# Patient Record
Sex: Male | Born: 1960 | Race: Black or African American | Hispanic: No | Smoking: Former smoker
Health system: Southern US, Community
[De-identification: ages and names within clinical notes are randomized; demographics above are authoritative.]

## PROBLEM LIST (undated history)

## (undated) DIAGNOSIS — E119 Type 2 diabetes mellitus without complications: Secondary | ICD-10-CM

## (undated) DIAGNOSIS — I4891 Unspecified atrial fibrillation: Secondary | ICD-10-CM

## (undated) DIAGNOSIS — I639 Cerebral infarction, unspecified: Secondary | ICD-10-CM

## (undated) DIAGNOSIS — Z87898 Personal history of other specified conditions: Secondary | ICD-10-CM

## (undated) DIAGNOSIS — I10 Essential (primary) hypertension: Secondary | ICD-10-CM

## (undated) HISTORY — DX: Essential (primary) hypertension: I10

## (undated) HISTORY — DX: Type 2 diabetes mellitus without complications: E11.9

## (undated) HISTORY — DX: Cerebral infarction, unspecified: I63.9

## (undated) HISTORY — DX: Personal history of other specified conditions: Z87.898

## (undated) HISTORY — PX: CHOLECYSTECTOMY: SHX55

---

## 2017-03-30 ENCOUNTER — Inpatient Hospital Stay
Admission: EM | Admit: 2017-03-30 | Discharge: 2017-03-31 | DRG: 637 | Disposition: A | Discharge status: Other Acute Inpatient Hospital | Payer: Medicaid Other | Attending: Internal Medicine | Admitting: Internal Medicine

## 2017-03-30 ENCOUNTER — Inpatient Hospital Stay: Payer: Medicaid Other

## 2017-03-30 ENCOUNTER — Emergency Department: Payer: Medicaid Other

## 2017-03-30 DIAGNOSIS — R51 Headache: Secondary | ICD-10-CM | POA: Diagnosis not present

## 2017-03-30 DIAGNOSIS — Y92230 Patient room in hospital as the place of occurrence of the external cause: Secondary | ICD-10-CM | POA: Diagnosis not present

## 2017-03-30 DIAGNOSIS — T17908S Unspecified foreign body in respiratory tract, part unspecified causing other injury, sequela: Secondary | ICD-10-CM | POA: Diagnosis not present

## 2017-03-30 DIAGNOSIS — Z4659 Encounter for fitting and adjustment of other gastrointestinal appliance and device: Secondary | ICD-10-CM

## 2017-03-30 DIAGNOSIS — Z431 Encounter for attention to gastrostomy: Secondary | ICD-10-CM | POA: Diagnosis not present

## 2017-03-30 DIAGNOSIS — G936 Cerebral edema: Secondary | ICD-10-CM | POA: Diagnosis not present

## 2017-03-30 DIAGNOSIS — R402134 Coma scale, eyes open, to sound, 24 hours or more after hospital admission: Secondary | ICD-10-CM | POA: Diagnosis not present

## 2017-03-30 DIAGNOSIS — E871 Hypo-osmolality and hyponatremia: Secondary | ICD-10-CM | POA: Diagnosis present

## 2017-03-30 DIAGNOSIS — R109 Unspecified abdominal pain: Secondary | ICD-10-CM | POA: Diagnosis not present

## 2017-03-30 DIAGNOSIS — I63511 Cerebral infarction due to unspecified occlusion or stenosis of right middle cerebral artery: Secondary | ICD-10-CM | POA: Diagnosis not present

## 2017-03-30 DIAGNOSIS — D72829 Elevated white blood cell count, unspecified: Secondary | ICD-10-CM | POA: Diagnosis present

## 2017-03-30 DIAGNOSIS — Z789 Other specified health status: Secondary | ICD-10-CM | POA: Diagnosis not present

## 2017-03-30 DIAGNOSIS — J9602 Acute respiratory failure with hypercapnia: Secondary | ICD-10-CM | POA: Diagnosis not present

## 2017-03-30 DIAGNOSIS — G44319 Acute post-traumatic headache, not intractable: Secondary | ICD-10-CM | POA: Diagnosis not present

## 2017-03-30 DIAGNOSIS — Z930 Tracheostomy status: Secondary | ICD-10-CM | POA: Diagnosis not present

## 2017-03-30 DIAGNOSIS — D649 Anemia, unspecified: Secondary | ICD-10-CM | POA: Diagnosis not present

## 2017-03-30 DIAGNOSIS — R739 Hyperglycemia, unspecified: Secondary | ICD-10-CM | POA: Diagnosis present

## 2017-03-30 DIAGNOSIS — R579 Shock, unspecified: Secondary | ICD-10-CM | POA: Diagnosis not present

## 2017-03-30 DIAGNOSIS — R140 Abdominal distension (gaseous): Secondary | ICD-10-CM | POA: Diagnosis not present

## 2017-03-30 DIAGNOSIS — E875 Hyperkalemia: Secondary | ICD-10-CM | POA: Diagnosis present

## 2017-03-30 DIAGNOSIS — J9620 Acute and chronic respiratory failure, unspecified whether with hypoxia or hypercapnia: Secondary | ICD-10-CM | POA: Diagnosis not present

## 2017-03-30 DIAGNOSIS — R29723 NIHSS score 23: Secondary | ICD-10-CM | POA: Diagnosis not present

## 2017-03-30 DIAGNOSIS — Z0189 Encounter for other specified special examinations: Secondary | ICD-10-CM

## 2017-03-30 DIAGNOSIS — N189 Chronic kidney disease, unspecified: Secondary | ICD-10-CM | POA: Diagnosis present

## 2017-03-30 DIAGNOSIS — J9811 Atelectasis: Secondary | ICD-10-CM | POA: Diagnosis not present

## 2017-03-30 DIAGNOSIS — Z452 Encounter for adjustment and management of vascular access device: Secondary | ICD-10-CM | POA: Diagnosis not present

## 2017-03-30 DIAGNOSIS — E1122 Type 2 diabetes mellitus with diabetic chronic kidney disease: Secondary | ICD-10-CM | POA: Diagnosis present

## 2017-03-30 DIAGNOSIS — J9600 Acute respiratory failure, unspecified whether with hypoxia or hypercapnia: Secondary | ICD-10-CM | POA: Diagnosis present

## 2017-03-30 DIAGNOSIS — Z9911 Dependence on respirator [ventilator] status: Secondary | ICD-10-CM | POA: Diagnosis not present

## 2017-03-30 DIAGNOSIS — E118 Type 2 diabetes mellitus with unspecified complications: Secondary | ICD-10-CM | POA: Diagnosis not present

## 2017-03-30 DIAGNOSIS — G9341 Metabolic encephalopathy: Secondary | ICD-10-CM | POA: Diagnosis present

## 2017-03-30 DIAGNOSIS — I639 Cerebral infarction, unspecified: Secondary | ICD-10-CM | POA: Diagnosis not present

## 2017-03-30 DIAGNOSIS — N179 Acute kidney failure, unspecified: Secondary | ICD-10-CM | POA: Diagnosis present

## 2017-03-30 DIAGNOSIS — Z781 Physical restraint status: Secondary | ICD-10-CM | POA: Diagnosis not present

## 2017-03-30 DIAGNOSIS — E1159 Type 2 diabetes mellitus with other circulatory complications: Secondary | ICD-10-CM | POA: Diagnosis not present

## 2017-03-30 DIAGNOSIS — E870 Hyperosmolality and hypernatremia: Secondary | ICD-10-CM | POA: Diagnosis not present

## 2017-03-30 DIAGNOSIS — R402364 Coma scale, best motor response, obeys commands, 24 hours or more after hospital admission: Secondary | ICD-10-CM | POA: Diagnosis not present

## 2017-03-30 DIAGNOSIS — I480 Paroxysmal atrial fibrillation: Secondary | ICD-10-CM | POA: Diagnosis not present

## 2017-03-30 DIAGNOSIS — J988 Other specified respiratory disorders: Secondary | ICD-10-CM | POA: Diagnosis not present

## 2017-03-30 DIAGNOSIS — E1110 Type 2 diabetes mellitus with ketoacidosis without coma: Secondary | ICD-10-CM | POA: Diagnosis present

## 2017-03-30 DIAGNOSIS — A419 Sepsis, unspecified organism: Secondary | ICD-10-CM | POA: Diagnosis present

## 2017-03-30 DIAGNOSIS — M542 Cervicalgia: Secondary | ICD-10-CM | POA: Diagnosis not present

## 2017-03-30 DIAGNOSIS — J189 Pneumonia, unspecified organism: Secondary | ICD-10-CM | POA: Diagnosis not present

## 2017-03-30 DIAGNOSIS — J9621 Acute and chronic respiratory failure with hypoxia: Secondary | ICD-10-CM | POA: Diagnosis not present

## 2017-03-30 DIAGNOSIS — J9601 Acute respiratory failure with hypoxia: Secondary | ICD-10-CM | POA: Diagnosis not present

## 2017-03-30 DIAGNOSIS — R1084 Generalized abdominal pain: Secondary | ICD-10-CM | POA: Diagnosis not present

## 2017-03-30 DIAGNOSIS — E860 Dehydration: Secondary | ICD-10-CM | POA: Diagnosis present

## 2017-03-30 DIAGNOSIS — I10 Essential (primary) hypertension: Secondary | ICD-10-CM | POA: Diagnosis not present

## 2017-03-30 DIAGNOSIS — K610 Anal abscess: Secondary | ICD-10-CM | POA: Diagnosis not present

## 2017-03-30 DIAGNOSIS — I63311 Cerebral infarction due to thrombosis of right middle cerebral artery: Secondary | ICD-10-CM | POA: Diagnosis not present

## 2017-03-30 DIAGNOSIS — R6521 Severe sepsis with septic shock: Secondary | ICD-10-CM | POA: Diagnosis not present

## 2017-03-30 DIAGNOSIS — Z430 Encounter for attention to tracheostomy: Secondary | ICD-10-CM | POA: Diagnosis not present

## 2017-03-30 DIAGNOSIS — E1111 Type 2 diabetes mellitus with ketoacidosis with coma: Secondary | ICD-10-CM | POA: Diagnosis not present

## 2017-03-30 DIAGNOSIS — I63413 Cerebral infarction due to embolism of bilateral middle cerebral arteries: Secondary | ICD-10-CM | POA: Diagnosis not present

## 2017-03-30 DIAGNOSIS — J9690 Respiratory failure, unspecified, unspecified whether with hypoxia or hypercapnia: Secondary | ICD-10-CM | POA: Diagnosis not present

## 2017-03-30 DIAGNOSIS — Z515 Encounter for palliative care: Secondary | ICD-10-CM | POA: Diagnosis not present

## 2017-03-30 DIAGNOSIS — J90 Pleural effusion, not elsewhere classified: Secondary | ICD-10-CM | POA: Diagnosis not present

## 2017-03-30 DIAGNOSIS — Z9289 Personal history of other medical treatment: Secondary | ICD-10-CM | POA: Diagnosis not present

## 2017-03-30 DIAGNOSIS — Z978 Presence of other specified devices: Secondary | ICD-10-CM | POA: Diagnosis not present

## 2017-03-30 DIAGNOSIS — R0902 Hypoxemia: Secondary | ICD-10-CM | POA: Diagnosis not present

## 2017-03-30 DIAGNOSIS — I63532 Cerebral infarction due to unspecified occlusion or stenosis of left posterior cerebral artery: Secondary | ICD-10-CM | POA: Diagnosis not present

## 2017-03-30 DIAGNOSIS — G935 Compression of brain: Secondary | ICD-10-CM | POA: Diagnosis not present

## 2017-03-30 DIAGNOSIS — Z9889 Other specified postprocedural states: Secondary | ICD-10-CM | POA: Diagnosis not present

## 2017-03-30 DIAGNOSIS — R402224 Coma scale, best verbal response, incomprehensible words, 24 hours or more after hospital admission: Secondary | ICD-10-CM | POA: Diagnosis not present

## 2017-03-30 DIAGNOSIS — J810 Acute pulmonary edema: Secondary | ICD-10-CM | POA: Diagnosis not present

## 2017-03-30 DIAGNOSIS — R571 Hypovolemic shock: Secondary | ICD-10-CM | POA: Diagnosis present

## 2017-03-30 DIAGNOSIS — N390 Urinary tract infection, site not specified: Secondary | ICD-10-CM | POA: Diagnosis not present

## 2017-03-30 DIAGNOSIS — Q321 Other congenital malformations of trachea: Secondary | ICD-10-CM | POA: Diagnosis not present

## 2017-03-30 DIAGNOSIS — A401 Sepsis due to streptococcus, group B: Secondary | ICD-10-CM | POA: Diagnosis not present

## 2017-03-30 DIAGNOSIS — D62 Acute posthemorrhagic anemia: Secondary | ICD-10-CM | POA: Diagnosis not present

## 2017-03-30 DIAGNOSIS — T17908D Unspecified foreign body in respiratory tract, part unspecified causing other injury, subsequent encounter: Secondary | ICD-10-CM | POA: Diagnosis not present

## 2017-03-30 DIAGNOSIS — G8194 Hemiplegia, unspecified affecting left nondominant side: Secondary | ICD-10-CM | POA: Diagnosis not present

## 2017-03-30 DIAGNOSIS — R414 Neurologic neglect syndrome: Secondary | ICD-10-CM | POA: Diagnosis not present

## 2017-03-30 DIAGNOSIS — J9622 Acute and chronic respiratory failure with hypercapnia: Secondary | ICD-10-CM | POA: Diagnosis not present

## 2017-03-30 DIAGNOSIS — I6789 Other cerebrovascular disease: Secondary | ICD-10-CM | POA: Diagnosis not present

## 2017-03-30 DIAGNOSIS — D6959 Other secondary thrombocytopenia: Secondary | ICD-10-CM | POA: Diagnosis not present

## 2017-03-30 DIAGNOSIS — R05 Cough: Secondary | ICD-10-CM | POA: Diagnosis not present

## 2017-03-30 DIAGNOSIS — Z794 Long term (current) use of insulin: Secondary | ICD-10-CM | POA: Diagnosis not present

## 2017-03-30 LAB — COMPREHENSIVE METABOLIC PANEL
ALK PHOS: 94 U/L (ref 38–126)
ALT: 14 U/L — ABNORMAL LOW (ref 17–63)
AST: 23 U/L (ref 15–41)
Albumin: 3.3 g/dL — ABNORMAL LOW (ref 3.5–5.0)
BILIRUBIN TOTAL: 2.3 mg/dL — AB (ref 0.3–1.2)
BUN: 52 mg/dL — ABNORMAL HIGH (ref 6–20)
CALCIUM: 9.4 mg/dL (ref 8.9–10.3)
CHLORIDE: 83 mmol/L — AB (ref 101–111)
CREATININE: 2.54 mg/dL — AB (ref 0.61–1.24)
GFR calc non Af Amer: 27 mL/min — ABNORMAL LOW (ref >60)
GFR, EST AFRICAN AMERICAN: 31 mL/min — AB (ref >60)
GLUCOSE: 1132 mg/dL — AB (ref 65–99)
Potassium: 6.2 mmol/L — ABNORMAL HIGH (ref 3.5–5.1)
Sodium: 115 mmol/L — CL (ref 135–145)
Total Protein: 7.6 g/dL (ref 6.5–8.1)

## 2017-03-30 LAB — URINALYSIS, COMPLETE (UACMP) WITH MICROSCOPIC
BACTERIA UA: NONE SEEN
BILIRUBIN URINE: NEGATIVE
Glucose, UA: >=500 mg/dL — AB
KETONES UR: 20 mg/dL — AB
LEUKOCYTES UA: NEGATIVE
NITRITE: NEGATIVE
PROTEIN: 100 mg/dL — AB
Specific Gravity, Urine: 1.022 (ref 1.005–1.030)
pH: 6 (ref 5.0–8.0)

## 2017-03-30 LAB — BLOOD GAS, ARTERIAL
ACID-BASE DEFICIT: 17.5 mmol/L — AB (ref 0.0–2.0)
BICARBONATE: 8.4 mmol/L — AB (ref 20.0–28.0)
FIO2: 1
LHR: 30 {breaths}/min
Map: 7 cmH20
Mechanical Rate: 18
O2 SAT: 99 %
PATIENT TEMPERATURE: 37
PEEP/CPAP: 5 cmH2O
VT: 550 mL
pCO2 arterial: 21 mmHg — ABNORMAL LOW (ref 32.0–48.0)
pH, Arterial: 7.21 — ABNORMAL LOW (ref 7.350–7.450)
pO2, Arterial: 154 mmHg — ABNORMAL HIGH (ref 83.0–108.0)

## 2017-03-30 LAB — LIPASE, BLOOD: Lipase: 181 U/L — ABNORMAL HIGH (ref 11–51)

## 2017-03-30 LAB — GLUCOSE, CAPILLARY: Glucose-Capillary: >600 mg/dL (ref 65–99)

## 2017-03-30 LAB — CBC
HEMATOCRIT: 49 % (ref 40.0–52.0)
Hemoglobin: 14.6 g/dL (ref 13.0–18.0)
MCH: 26.6 pg (ref 26.0–34.0)
MCHC: 29.8 g/dL — AB (ref 32.0–36.0)
MCV: 89.2 fL (ref 80.0–100.0)
Platelets: 159 10*3/uL (ref 150–440)
RBC: 5.5 MIL/uL (ref 4.40–5.90)
RDW: 17.5 % — ABNORMAL HIGH (ref 11.5–14.5)
WBC: 24 10*3/uL — ABNORMAL HIGH (ref 3.8–10.6)

## 2017-03-30 LAB — MRSA PCR SCREENING: MRSA BY PCR: NEGATIVE

## 2017-03-30 LAB — TROPONIN I

## 2017-03-30 MED ORDER — SODIUM CHLORIDE 0.9 % IV SOLN: INTRAVENOUS | Status: DC

## 2017-03-30 MED ORDER — FENTANYL 2500MCG IN NS 250ML (10MCG/ML) PREMIX INFUSION
25.0000 ug/h | INTRAVENOUS | Status: DC
  Administered 2017-03-30: 50 ug/h via INTRAVENOUS
  Filled 2017-03-30: qty 250

## 2017-03-30 MED ORDER — SODIUM BICARBONATE 8.4 % IV SOLN
50.0000 meq | Freq: Once | INTRAVENOUS | Status: AC
  Administered 2017-03-30: 50 meq via INTRAVENOUS
  Filled 2017-03-30: qty 150

## 2017-03-30 MED ORDER — SODIUM CHLORIDE 0.9 % IV SOLN
INTRAVENOUS | Status: AC
  Administered 2017-03-30: 22:00:00 via INTRAVENOUS

## 2017-03-30 MED ORDER — SODIUM BICARBONATE 8.4 % IV SOLN
INTRAVENOUS | Status: AC
  Administered 2017-03-30: 50 meq via INTRAVENOUS
  Filled 2017-03-30: qty 50

## 2017-03-30 MED ORDER — PANTOPRAZOLE SODIUM 40 MG IV SOLR
40.0000 mg | INTRAVENOUS | Status: DC
  Administered 2017-03-30: 40 mg via INTRAVENOUS
  Filled 2017-03-30: qty 40

## 2017-03-30 MED ORDER — SODIUM CHLORIDE 0.9 % IV BOLUS (SEPSIS)
1000.0000 mL | Freq: Once | INTRAVENOUS | Status: AC
  Administered 2017-03-30: 1000 mL via INTRAVENOUS

## 2017-03-30 MED ORDER — ROCURONIUM BROMIDE 50 MG/5ML IV SOLN
INTRAVENOUS | Status: AC | PRN
  Administered 2017-03-30: 100 mg via INTRAVENOUS

## 2017-03-30 MED ORDER — HEPARIN SODIUM (PORCINE) 5000 UNIT/ML IJ SOLN
5000.0000 [IU] | Freq: Three times a day (TID) | INTRAMUSCULAR | Status: DC
  Administered 2017-03-30 – 2017-03-31 (×3): 5000 [IU] via SUBCUTANEOUS
  Filled 2017-03-30 (×3): qty 1

## 2017-03-30 MED ORDER — POTASSIUM CHLORIDE 10 MEQ/100ML IV SOLN
10.0000 meq | INTRAVENOUS | Status: AC
  Filled 2017-03-30 (×2): qty 100

## 2017-03-30 MED ORDER — INSULIN ASPART 100 UNIT/ML ~~LOC~~ SOLN
SUBCUTANEOUS | Status: AC
  Administered 2017-03-30: 10 [IU] via INTRAVENOUS
  Filled 2017-03-30: qty 1

## 2017-03-30 MED ORDER — MIDAZOLAM HCL 2 MG/2ML IJ SOLN
2.0000 mg | INTRAMUSCULAR | Status: DC | PRN
  Administered 2017-03-30 (×2): 2 mg via INTRAVENOUS
  Filled 2017-03-30 (×2): qty 2

## 2017-03-30 MED ORDER — STERILE WATER FOR INJECTION IV SOLN
Freq: Once | INTRAVENOUS | Status: AC
  Administered 2017-03-30: 19:00:00 via INTRAVENOUS
  Filled 2017-03-30: qty 850

## 2017-03-30 MED ORDER — SODIUM CHLORIDE 0.9 % IV SOLN
1.0000 mg/h | INTRAVENOUS | Status: DC
  Administered 2017-03-30: 1 mg/h via INTRAVENOUS
  Filled 2017-03-30: qty 10

## 2017-03-30 MED ORDER — SODIUM BICARBONATE 8.4 % IV SOLN
50.0000 meq | Freq: Once | INTRAVENOUS | Status: AC
  Administered 2017-03-30: 50 meq via INTRAVENOUS

## 2017-03-30 MED ORDER — SODIUM CHLORIDE 0.9 % IV SOLN
INTRAVENOUS | Status: DC
  Administered 2017-03-30: 5.4 [IU]/h via INTRAVENOUS
  Filled 2017-03-30: qty 1

## 2017-03-30 MED ORDER — SODIUM BICARBONATE 8.4 % IV SOLN
50.0000 meq | Freq: Once | INTRAVENOUS | Status: AC
Start: 2017-03-30 — End: 2017-03-30
  Administered 2017-03-30: 50 meq via INTRAVENOUS

## 2017-03-30 MED ORDER — MIDAZOLAM HCL 2 MG/2ML IJ SOLN: 2.0000 mg | INTRAMUSCULAR | Status: DC | PRN

## 2017-03-30 MED ORDER — NOREPINEPHRINE BITARTRATE 1 MG/ML IV SOLN
0.0000 ug/min | INTRAVENOUS | Status: DC
  Administered 2017-03-30: 8 ug/min via INTRAVENOUS
  Administered 2017-03-31: 40 ug/min via INTRAVENOUS
  Filled 2017-03-30 (×2): qty 4

## 2017-03-30 MED ORDER — ETOMIDATE 2 MG/ML IV SOLN
INTRAVENOUS | Status: AC | PRN
  Administered 2017-03-30: 15 mg via INTRAVENOUS

## 2017-03-30 MED ORDER — SODIUM CHLORIDE 0.45 % IV SOLN
INTRAVENOUS | Status: DC
  Administered 2017-03-31: via INTRAVENOUS
  Filled 2017-03-30: qty 150

## 2017-03-30 MED ORDER — FENTANYL CITRATE (PF) 100 MCG/2ML IJ SOLN
50.0000 ug | Freq: Once | INTRAMUSCULAR | Status: AC
  Administered 2017-03-30: 50 ug via INTRAVENOUS

## 2017-03-30 MED ORDER — SODIUM BICARBONATE 8.4 % IV SOLN
INTRAVENOUS | Status: AC
  Filled 2017-03-30: qty 50

## 2017-03-30 MED ORDER — SODIUM CHLORIDE 0.9 % IV SOLN
INTRAVENOUS | Status: DC
  Administered 2017-03-30 – 2017-03-31 (×3): via INTRAVENOUS

## 2017-03-30 MED ORDER — INSULIN ASPART 100 UNIT/ML ~~LOC~~ SOLN
10.0000 [IU] | Freq: Once | SUBCUTANEOUS | Status: AC
  Administered 2017-03-30: 10 [IU] via INTRAVENOUS

## 2017-03-30 MED ORDER — PROPOFOL 1000 MG/100ML IV EMUL
5.0000 ug/kg/min | Freq: Once | INTRAVENOUS | Status: AC
  Administered 2017-03-30: 10 ug/kg/min via INTRAVENOUS

## 2017-03-30 MED ORDER — PROPOFOL 1000 MG/100ML IV EMUL
INTRAVENOUS | Status: AC
  Filled 2017-03-30: qty 100

## 2017-03-30 MED ORDER — FENTANYL BOLUS VIA INFUSION
50.0000 ug | INTRAVENOUS | Status: DC | PRN
  Filled 2017-03-30: qty 50

## 2017-03-30 MED ORDER — SODIUM CHLORIDE 0.9 % IV SOLN
INTRAVENOUS | Status: DC
  Administered 2017-03-30: 20:00:00 via INTRAVENOUS
  Filled 2017-03-30 (×2): qty 250

## 2017-03-30 MED ORDER — SODIUM CHLORIDE 0.9 % IV SOLN
INTRAVENOUS | Status: AC
  Administered 2017-03-31: 12.7 [IU]/h via INTRAVENOUS
  Filled 2017-03-30 (×2): qty 1

## 2017-03-30 MED ORDER — DEXTROSE-NACL 5-0.45 % IV SOLN
INTRAVENOUS | Status: DC
  Administered 2017-03-31: 100 mL/h via INTRAVENOUS

## 2017-03-30 NOTE — ED Notes (Signed)
Dr. Cinda Quest called to bedside for Kussmaul respirations. At bedside. Patient is not responsive at this time. ST on monitor.

## 2017-03-30 NOTE — ED Triage Notes (Signed)
Patient presents to ED via [facility] [place] where he was found to be lethargic. EMS report SpO2 80%. EMS placed patient on 4L [place], SpO2 up to 96%. 90/40 BP per EMS. CBG reads HIGH.

## 2017-03-30 NOTE — ED Notes (Signed)
Pharmacy called in regards to need of insulin drip.

## 2017-03-30 NOTE — ED Notes (Signed)
Joe Wilkinson states he will call back about ketamine drip.

## 2017-03-30 NOTE — ED Notes (Signed)
Attempted to call pharmacy in regards to need of drips. No answer.

## 2017-03-30 NOTE — Progress Notes (Signed)
[facility] Progress Note Patient Name: Joe Wilkinson DOB: [birth year] MRN: [id number]   Date of Service  03/30/2017  HPI/Events of Note  Severe dka/ required intubation and PCCM service on the way to see  eICU Interventions  No immediate need to change rx, will monitor     Intervention Category Major Interventions: Respiratory failure - evaluation and management  Christinia Gully 03/30/2017, 9:26 PM

## 2017-03-30 NOTE — ED Notes (Signed)
On phone with Corene Cornea from pharmacy for ketamine drip. NO propofol due to hypotension.

## 2017-03-30 NOTE — ED Notes (Signed)
Bair hugger applied at this time

## 2017-03-30 NOTE — ED Notes (Signed)
IO placed to left tib by Delilah Shan, RN.

## 2017-03-30 NOTE — ED Notes (Signed)
Pharmacy called and notified of need of bicarb drip.

## 2017-03-30 NOTE — Progress Notes (Signed)
[facility] Progress Note Patient Name: Joe Wilkinson DOB: [birth year] MRN: [id number]   Date of Service  03/30/2017  HPI/Events of Note  Vent pt/ GI prophylaxis needed   eICU Interventions  protonix 40 mg IV daily      Intervention Category Intermediate Interventions: Best-practice therapies (e.g. DVT, beta blocker, etc.)  Christinia Gully 03/30/2017, 9:50 PM

## 2017-03-30 NOTE — Procedures (Signed)
Central Venous Catheter Insertion Procedure Note -Left Internal Jugular Joe Wilkinson [id number] 1961/02/05  Procedure: Insertion of Central Venous Catheter Indications: Assessment of intravascular volume, Drug and/or fluid administration and Frequent blood sampling  Procedure Details Consent: Unable to obtain consent because of emergent medical necessity. Time Out: Verified patient identification, verified procedure, site/side was marked, verified correct patient position, special equipment/implants available, medications/allergies/relevent history reviewed, required imaging and test results available.  Performed  Maximum sterile technique was used including antiseptics, cap, gloves, gown, hand hygiene, mask and sheet. Skin prep: Chlorhexidine; local anesthetic administered A antimicrobial bonded/coated triple lumen catheter was placed in the left internal jugular vein using the Seldinger technique.  Evaluation Blood flow good Complications: No apparent complications Patient did tolerate procedure well. Chest X-ray ordered to verify placement.  CXR: pending.  Procedure performed under direct ultrasound guidance for real time vessel cannulation.      [facility]

## 2017-03-30 NOTE — ED Provider Notes (Signed)
[facility] Emergency Department Provider Note  Time seen: 6:09 PM  I have reviewed the triage vital signs and the nursing notes.   HISTORY  Chief Complaint Hyperglycemia    HPI Joe Wilkinson is a 56 y.o. male with no known past medical history who presents to the emergency department for confusion/hyperglycemia. According to EMS the patient was on the bus was noted to be acting abnormal, EMS was called and noted the patient to be hyperglycemic with their monitor reading "high".Here the patient can tell me his name but cannot provide any other information. He is moaning on the bed seems very restless, moving all extremities. Will not answer questions or follow commands at this point.  No past medical history on file.  There are no active problems to display for this patient.   No past surgical history on file.  Prior to Admission medications   Not on File    Allergies not on file  No family history on file.  Social History Social History  Substance Use Topics  . Smoking status: Not on file  . Smokeless tobacco: Not on file  . Alcohol use Not on file    Review of Systems Unable to obtain a review of systems due to altered mental status.  ____________________________________________   PHYSICAL EXAM:  VITAL SIGNS: ED Triage Vitals [03/30/17 1808]  Enc Vitals Group     BP      Pulse Rate (!) 109     Resp (!) 25     Temp      Temp src      SpO2 100 %     Weight 250 lb (113.4 kg)     Height 5\' 10"  (1.778 m)     Head Circumference      Peak Flow      Pain Score      Pain Loc      Pain Edu?      Excl. in [place]?     Constitutional: Patient is awake, alert, eyes open and will make eye contact, at one point was able to tell me his name however does not answer any other questions, is not following commands. Patient is writhing around on the bed and moaning. Eyes: Normal exam ENT   Head: Normocephalic and atraumatic.   Nose: No  congestion/rhinnorhea.   Mouth/Throat: Very dry appearing mucous membranes Cardiovascular: Regular rhythm, rate around 120. No obvious murmur. Respiratory: Normal respiratory effort without tachypnea nor retractions. Breath sounds are clear  Gastrointestinal: Soft and nontender. No distention.  Musculoskeletal: Nontender with normal range of motion in all extremities. Neurologic:  Moves all extremities, writhing around on the bed. Appears altered. Skin:  Skin is warm, but very dry appearing. Psychiatric: Acting confused, moaning, unable to adequately assess psychiatrically.  ____________________________________________    EKG  EKG reviewed and interpreted by myself shows sinus tachycardia 108 bpm, narrow QRS with normal axis, normal intervals, nonspecific ST changes but somewhat peaked T waves. Significant electrical interference due to patient movement  ____________________________________________    RADIOLOGY  Imaging pending for patient stabilization  ____________________________________________   INITIAL IMPRESSION / ASSESSMENT AND PLAN / ED COURSE  Pertinent labs & imaging results that were available during my care of the patient were reviewed by me and considered in my medical decision making (see chart for details).  Patient presents to the emergency department with altered mental status found to be hyperglycemic. Patient appears confused, writhing around in bed and moaning. He is awake and  alert was able to tell me his name at one point. We will check labs including VBG we will begin IV hydration while awaiting lab results.  ----------------------------------------- 6:27 PM on 03/30/2017 -----------------------------------------  Shortly after my evaluation the patient he became extremely altered and largely nonresponsive. Began with heavy deep breathing consistent with Kausmal respirations. Decision was made to intubate the patient a was unresponsive to painful  stimuli at this point.  INTUBATION Performed by: Harvest Dark  Required items: required blood products, implants, devices, and special equipment available Patient identity confirmed: provided demographic data and hospital-assigned identification number Time out: Immediately prior to procedure a "time out" was called to verify the correct patient, procedure, equipment, support staff and site/side marked as required.  Indications: airway protection, respiratory failure  Intubation method: 4.0 Glidescope Laryngoscopy   Preoxygenation: 100% BVM  Sedatives: 15mg  Etomidate Paralytic: 100mg  Rocuroniuam   Tube Size: 8.0 cuffed  Post-procedure assessment: chest rise and ETCO2 monitor Breath sounds: equal and absent over the epigastrium Tube secured with: ETT holder Chest x-ray interpreted by radiologist and me.  Chest x-ray findings: endotracheal tube in appropriate position  Patient tolerated the procedure well with no immediate complications.   Patient's pH is resulted at 6.9. We will bolus him feels of sodium bicarbonate. A sodium bicarbonate drip has been ordered for the patient. His potassium is 6.2. We will continue infusing fluids given his hypotension which is improving with fluids. Patient noted to have a temp Foley temperature of 92.9 we will apply a bear hugger. Patient will be admitted to the intensive care unit for further treatment.  CRITICAL CARE Performed by: Harvest Dark   Total critical care time: 60 minutes  Critical care time was exclusive of separately billable procedures and treating other patients.  Critical care was necessary to treat or prevent imminent or life-threatening deterioration.  Critical care was time spent personally by me on the following activities: development of treatment plan with patient and/or surrogate as well as nursing, discussions with consultants, evaluation of patient's response to treatment, examination of patient,  obtaining history from patient or surrogate, ordering and performing treatments and interventions, ordering and review of laboratory studies, ordering and review of radiographic studies, pulse oximetry and re-evaluation of patient's condition.  ____________________________________________   FINAL CLINICAL IMPRESSION(S) / ED DIAGNOSES  Confusion Hyperglycemia Hyperglycemic coma DKA   Harvest Dark, MD 03/30/17 [phone]

## 2017-03-30 NOTE — Progress Notes (Signed)
Critical ABG result was read to and verified [place], NP.  Orders for a repeat ABG at 0500 were issued.

## 2017-03-30 NOTE — H&P (Signed)
Joe Wilkinson is an 56 y.o. male.   Chief Complaint: unresponsive HPI: this is a 56 year old male with an unknown history. He was [place] lethargic [place] bus stop and then became unresponsive. He's found to be in DKA with severe acidosis. He was able to respond to some commands upon arrival. However developed [place] breathing and then respiratory status deteriorated requiring intubation. Currently he is on a ventilator.  No past medical history on file.  No past surgical history on file.  No family history on file. Social History:  has no tobacco, alcohol, and drug history on file.  Allergies: No Known Allergies   (Not in a hospital admission)  Results for orders placed or performed during the hospital encounter of 03/30/17 (from the past 48 hour(s))  CBC     Status: Abnormal   Collection Time: 03/30/17  6:08 PM  Result Value Ref Range   WBC 24.0 (H) 3.8 - 10.6 K/uL   RBC 5.50 4.40 - 5.90 MIL/uL   Hemoglobin 14.6 13.0 - 18.0 g/dL    Comment: RESULT REPEATED AND VERIFIED   HCT 49.0 40.0 - 52.0 %   MCV 89.2 80.0 - 100.0 fL   MCH 26.6 26.0 - 34.0 pg   MCHC 29.8 (L) 32.0 - 36.0 g/dL   RDW 17.5 (H) 11.5 - 14.5 %   Platelets 159 150 - 440 K/uL    Comment: PLATELET COUNT CONFIRMED BY SMEAR GIANT PLATELETS SEEN   Comprehensive metabolic panel     Status: Abnormal   Collection Time: 03/30/17  6:08 PM  Result Value Ref Range   Sodium 115 (LL) 135 - 145 mmol/L    Comment: CRITICAL RESULT CALLED TO, READ BACK BY AND VERIFIED WITH DR PADUCHOWSKI @ 3154 03/30/17 BY [facility]    Potassium 6.2 (H) 3.5 - 5.1 mmol/L   Chloride 83 (L) 101 - 111 mmol/L   CO2 <7 (L) 22 - 32 mmol/L   Glucose, Bld 1,132 (HH) 65 - 99 mg/dL    Comment: CRITICAL RESULT CALLED TO, READ BACK BY AND VERIFIED WITH DR PADUCHOWSKI @ 0086 03/30/17 BY [facility]    BUN 52 (H) 6 - 20 mg/dL   Creatinine, Ser 2.54 (H) 0.61 - 1.24 mg/dL   Calcium 9.4 8.9 - 10.3 mg/dL   Total Protein 7.6 6.5 - 8.1 g/dL   Albumin 3.3 (L) 3.5 - 5.0 g/dL    AST 23 15 - 41 U/L   ALT 14 (L) 17 - 63 U/L   Alkaline Phosphatase 94 38 - 126 U/L   Total Bilirubin 2.3 (H) 0.3 - 1.2 mg/dL   GFR calc non Af Amer 27 (L) >60 mL/min   GFR calc Af Amer 31 (L) >60 mL/min    Comment: (NOTE) The eGFR has been calculated using the CKD EPI equation. This calculation has not been validated in all clinical situations. eGFR's persistently <60 mL/min signify possible Chronic Kidney Disease.    Anion gap NOT CALCULATED 5 - 15  Lipase, blood     Status: Abnormal   Collection Time: 03/30/17  6:08 PM  Result Value Ref Range   Lipase 181 (H) 11 - 51 U/L  Troponin I     Status: None   Collection Time: 03/30/17  6:08 PM  Result Value Ref Range   Troponin I <0.03 <0.03 ng/mL  Urinalysis, Complete w Microscopic     Status: Abnormal   Collection Time: 03/30/17  6:08 PM  Result Value Ref Range   Color, Urine YELLOW (A)  YELLOW   APPearance CLEAR (A) CLEAR   Specific Gravity, Urine 1.022 1.005 - 1.030   pH 6.0 5.0 - 8.0   Glucose, UA >=500 (A) NEGATIVE mg/dL   Hgb urine dipstick MODERATE (A) NEGATIVE   Bilirubin Urine NEGATIVE NEGATIVE   Ketones, ur 20 (A) NEGATIVE mg/dL   Protein, ur 100 (A) NEGATIVE mg/dL   Nitrite NEGATIVE NEGATIVE   Leukocytes, UA NEGATIVE NEGATIVE   RBC / HPF 0-5 0 - 5 RBC/hpf   WBC, UA 0-5 0 - 5 WBC/hpf   Bacteria, UA NONE SEEN NONE SEEN   Squamous Epithelial / LPF 0-5 (A) NONE SEEN   Mucus PRESENT    Hyaline Casts, UA PRESENT   Blood gas, venous     Status: Abnormal (Preliminary result)   Collection Time: 03/30/17  6:10 PM  Result Value Ref Range   FIO2 0.21    pH, Ven 6.94 (LL) 7.250 - 7.430    Comment: CRITICAL RESULT CALLED TO, READ BACK BY AND VERIFIED WITH: PADUCHOWSKI AT 1839, 03/30/2017, LS    pCO2, Ven 19 (LL) 44.0 - 60.0 mmHg    Comment: CRITICAL RESULT CALLED TO, READ BACK BY AND VERIFIED WITH:  PADUSHOWSKI AT 1839, 03/30/2017, LS    pO2, Ven 50.0 (H) 32.0 - 45.0 mmHg   Bicarbonate PENDING 20.0 - 28.0 mmol/L   O2  Saturation PENDING %   Patient temperature 37.0    Collection site VENOUS    Sample type VENOUS   Glucose, capillary     Status: Abnormal   Collection Time: 03/30/17  6:14 PM  Result Value Ref Range   Glucose-Capillary >600 (HH) 65 - 99 mg/dL  Glucose, capillary     Status: Abnormal   Collection Time: 03/30/17  6:55 PM  Result Value Ref Range   Glucose-Capillary >600 (HH) 65 - 99 mg/dL   Dg Chest Portable 1 View  Result Date: 03/30/2017 CLINICAL DATA:  Endotracheal tube placement EXAM: PORTABLE CHEST 1 VIEW COMPARISON:  None. FINDINGS: The heart size and mediastinal contours are within normal limits. The tip of an endotracheal tube is approximately 3.4 cm above the carina in satisfactory position. There also appears be gastric tube however the tip is not well visualized and occasional be followed to the level of the GE junction. External defibrillator paddles slightly limit assessment as well as EKG leads. Heart size is normal. No aortic aneurysm. No acute pneumonic consolidation. Both lungs are clear. The visualized skeletal structures are unremarkable. IMPRESSION: 1. Satisfactory endotracheal tube position at 3.4 cm above the carina. 2. There appears to be a gastric tube as well although the tip is not well visualized and can only be followed to the level of the GE junction. Electronically Signed   By: Ashley Royalty M.D.   On: 03/30/2017 19:33    Review of Systems  Unable to perform ROS: Acuity of condition    Blood pressure (!) 75/60, pulse (!) 117, temperature (!) 93.5 F (34.2 C), resp. rate (!) 24, height '5\' 10"'$  (1.778 m), weight 113.4 kg (250 lb), SpO2 100 %. Physical Exam  Constitutional:  Ill-looking black male who is on an ventilator.  HENT:  Head: Normocephalic.  ET tube in place  Eyes: No scleral icterus.  Neck: No JVD present. No tracheal deviation present. No thyromegaly present.  Cardiovascular:  Tachycardic with no murmurs  Respiratory:  Coarse breath sounds. ET tube  in place.  GI: Soft. Bowel sounds are normal. He exhibits no distension and no mass.  Musculoskeletal:  He exhibits no edema or tenderness.  Neurological:  Sedated on a ventilator  Skin: Skin is warm and dry.     Assessment/Plan 1. Diabetic ketoacidosis. Patient's blood sugar over 1100 and is very acidotic. We'll go ahead and start very aggressive IV fluids We'll also put him on insulin drip per the DKA protocol. Check frequent BMPs for electrolytes and for anion gap closure. 2. Acute respiratory failure. Suspect he fatigued because of trying to compensate for his severe acidosis. He required intubation. We'll go ahead and continue ventilator support until acidosis is corrected. Chest x-ray does not show any sign of infection or pulmonary edema. 3. Acute renal failure. Suspect is from dehydration from the high blood sugars. Or he could have a history of chronic renal failure but is unknown at this time. We'll continue fluid resuscitation and see how his kidneys respond.  4. Hypovolemic shock. Likely secondary to volume depletion. Also he was given some propofolduring intubation which may drop his pressure. Currently giving him 2 L IV fluids wide open. He is Re: Receive 2 L. We'll start pressors if necessary. However with this severe acidosis that may not work adequately.. 5. Severe acidosis. Secondary to #1. Should correct out with aggressive IV fluid treatment and correction of the blood sugar.  Total critical care time spent 60 minutes  Baxter Hire, MD 03/30/2017, 8:04 PM

## 2017-03-30 NOTE — Consult Note (Signed)
Name: Joe Wilkinson MRN: [id number] DOB: April 20, 1961    ADMISSION DATE:  03/30/2017  CONSULTATION DATE: 03/30/17  REFERRING MD :  Dr. Harrel Lemon  CHIEF COMPLAINT:  unresponsive  BRIEF PATIENT DESCRIPTION: 56 year old with severe DKA .  Intubated for respiratory insufficiency.  SIGNIFICANT EVENTS  8/23 Patient was found unresponsive with severe DKA 8/23 Intubated for respiratory insufficiency  STUDIES:  8/23 CT head>>No acute intracranial abnormality identified. Unremarkable CT of the brain.    HISTORY OF PRESENT ILLNESS:  Joe Wilkinson is a 56 year old male with no known medical history was found lethargic and subsequently unresponsive . He was found to be in severe DKA with blood glucose >1132mg /dl and severe metabolic acidosis.  He was able to respond to some commands upon arrival and was noted to have Kussmaul's respiration.  His respiratory status deteriorated requiring intubation and mechanical ventilation.  Patient was send to the ICU with CCM team for further management.  PAST MEDICAL HISTORY :   has no past medical history on file.  has no past surgical history on file. Prior to Admission medications   Not on File   No Known Allergies  FAMILY HISTORY:  family history is not on file. SOCIAL HISTORY:    REVIEW OF SYSTEMS:   Constitutional: Negative for fever, chills, weight loss, malaise/fatigue and diaphoresis.  HENT: Negative for hearing loss, ear pain, nosebleeds, congestion, sore throat, neck pain, tinnitus and ear discharge.   Eyes: Negative for blurred vision, double vision, photophobia, pain, discharge and redness.  Respiratory: Negative for cough, hemoptysis, sputum production, shortness of breath, wheezing and stridor.   Cardiovascular: Negative for chest pain, palpitations, orthopnea, claudication, leg swelling and PND.  Gastrointestinal: Negative for heartburn, nausea, vomiting, abdominal pain, diarrhea, constipation, blood in stool and melena.    Genitourinary: Negative for dysuria, urgency, frequency, hematuria and flank pain.  Musculoskeletal: Negative for myalgias, back pain, joint pain and falls.  Skin: Negative for itching and rash.  Neurological: Negative for dizziness, tingling, tremors, sensory change, speech change, focal weakness, seizures, loss of consciousness, weakness and headaches.  Endo/Heme/Allergies: Negative for environmental allergies and polydipsia. Does not bruise/bleed easily.  SUBJECTIVE: Patient  Is intubated and on mechanical ventilation  VITAL SIGNS: Temp:  [92.8 F (33.8 C)-93.5 F (34.2 C)] 93.5 F (34.2 C) (08/23 1951) Pulse Rate:  [109-127] 117 (08/23 1922) Resp:  [24-25] 24 (08/23 1951) BP: (59-141)/(30-103) 75/60 (08/23 1951) SpO2:  [97 %-100 %] 100 % (08/23 1922) FiO2 (%):  [100 %] 100 % (08/23 1825) Weight:  [113.4 kg (250 lb)] 113.4 kg (250 lb) (08/23 1808)  PHYSICAL EXAMINATION: General:  African American male intubated and on mechanical ventillation Neuro: non purposeful movements HEENT:  AT,[place],No JVD,PERRLA Cardiovascular: S1S2, Regular, no m/r/g Lungs:clear bilaterally, no wheezes,crackles,rhonchi Abdomen: obese, round, soft, active Bowel sounds Musculoskeletal: no edema, cyanosis noted Skin:  Warm,dry and intact   Recent Labs Lab 03/30/17 1808  NA 115*  K 6.2*  CL 83*  CO2 <7*  BUN 52*  CREATININE 2.54*  GLUCOSE 1,132*    Recent Labs Lab 03/30/17 1808  HGB 14.6  HCT 49.0  WBC 24.0*  PLT 159   Dg Chest Portable 1 View  Result Date: 03/30/2017 CLINICAL DATA:  Endotracheal tube placement EXAM: PORTABLE CHEST 1 VIEW COMPARISON:  None. FINDINGS: The heart size and mediastinal contours are within normal limits. The tip of an endotracheal tube is approximately 3.4 cm above the carina in satisfactory position. There also appears be gastric tube however  the tip is not well visualized and occasional be followed to the level of the GE junction. External defibrillator paddles  slightly limit assessment as well as EKG leads. Heart size is normal. No aortic aneurysm. No acute pneumonic consolidation. Both lungs are clear. The visualized skeletal structures are unremarkable. IMPRESSION: 1. Satisfactory endotracheal tube position at 3.4 cm above the carina. 2. There appears to be a gastric tube as well although the tip is not well visualized and can only be followed to the level of the GE junction. Electronically Signed   By: Ashley Royalty M.D.   On: 03/30/2017 19:33    ASSESSMENT / PLAN: Acute respiratory failure secondary to severe DKA Severe Anion gap Metabolic Acidosis Hypovolemic Hyponatremia Hypovolemic Shock Hyperkalemia Acute Kidney injury secondary to severe dehydration Leukocytosis possibly related to stress response   Plan Vent settings established and reviewed VAP bundle implemented PAD protocol implemented Aggressive hydration with I/V fluids Will start pressors to keep MAP goals>65 Will Monitor BMP as per DKA protocol Will check Blood glucose every hour Will transition to D5 when Blood glucose is <250 Will Transition when CO2>20 and AG<12 Continue NaHCO3 gtt ABG in am Will monitor ABG closely to monitor improvement of acidosis Strict I/O Hyperkalemia and hyponatremia should correct as glucose level improves.    Eligha Kmetz,AG-ACNP Pulmonary and [place]   03/30/2017, 8:16 PM

## 2017-03-31 ENCOUNTER — Inpatient Hospital Stay (HOSPITAL_COMMUNITY)
Admission: RE | Admit: 2017-03-31 | Discharge: 2017-05-29 | DRG: 003 | Disposition: A | Discharge status: Skilled Nursing Facility | Payer: Medicaid Other | Source: Other Acute Inpatient Hospital | Attending: Internal Medicine | Admitting: Internal Medicine

## 2017-03-31 ENCOUNTER — Inpatient Hospital Stay: Admission: AD | Admit: 2017-03-31 | Payer: Self-pay | Source: Other Acute Inpatient Hospital | Admitting: Neurology

## 2017-03-31 ENCOUNTER — Inpatient Hospital Stay: Payer: Medicaid Other

## 2017-03-31 ENCOUNTER — Inpatient Hospital Stay
Admission: AD | Admit: 2017-03-31 | Payer: Self-pay | Source: Other Acute Inpatient Hospital | Admitting: Critical Care Medicine

## 2017-03-31 DIAGNOSIS — J9622 Acute and chronic respiratory failure with hypercapnia: Secondary | ICD-10-CM | POA: Diagnosis not present

## 2017-03-31 DIAGNOSIS — R140 Abdominal distension (gaseous): Secondary | ICD-10-CM | POA: Diagnosis not present

## 2017-03-31 DIAGNOSIS — E1165 Type 2 diabetes mellitus with hyperglycemia: Secondary | ICD-10-CM | POA: Diagnosis present

## 2017-03-31 DIAGNOSIS — B961 Klebsiella pneumoniae [K. pneumoniae] as the cause of diseases classified elsewhere: Secondary | ICD-10-CM | POA: Diagnosis present

## 2017-03-31 DIAGNOSIS — R451 Restlessness and agitation: Secondary | ICD-10-CM | POA: Diagnosis present

## 2017-03-31 DIAGNOSIS — A401 Sepsis due to streptococcus, group B: Principal | ICD-10-CM | POA: Diagnosis present

## 2017-03-31 DIAGNOSIS — I63532 Cerebral infarction due to unspecified occlusion or stenosis of left posterior cerebral artery: Secondary | ICD-10-CM | POA: Diagnosis present

## 2017-03-31 DIAGNOSIS — I63411 Cerebral infarction due to embolism of right middle cerebral artery: Secondary | ICD-10-CM | POA: Diagnosis present

## 2017-03-31 DIAGNOSIS — Z978 Presence of other specified devices: Secondary | ICD-10-CM

## 2017-03-31 DIAGNOSIS — Z9911 Dependence on respirator [ventilator] status: Secondary | ICD-10-CM

## 2017-03-31 DIAGNOSIS — Q321 Other congenital malformations of trachea: Secondary | ICD-10-CM

## 2017-03-31 DIAGNOSIS — G935 Compression of brain: Secondary | ICD-10-CM

## 2017-03-31 DIAGNOSIS — J9690 Respiratory failure, unspecified, unspecified whether with hypoxia or hypercapnia: Secondary | ICD-10-CM

## 2017-03-31 DIAGNOSIS — Z930 Tracheostomy status: Secondary | ICD-10-CM

## 2017-03-31 DIAGNOSIS — R05 Cough: Secondary | ICD-10-CM

## 2017-03-31 DIAGNOSIS — Z431 Encounter for attention to gastrostomy: Secondary | ICD-10-CM

## 2017-03-31 DIAGNOSIS — K610 Anal abscess: Secondary | ICD-10-CM | POA: Diagnosis present

## 2017-03-31 DIAGNOSIS — I639 Cerebral infarction, unspecified: Secondary | ICD-10-CM

## 2017-03-31 DIAGNOSIS — K2960 Other gastritis without bleeding: Secondary | ICD-10-CM | POA: Diagnosis present

## 2017-03-31 DIAGNOSIS — Z452 Encounter for adjustment and management of vascular access device: Secondary | ICD-10-CM

## 2017-03-31 DIAGNOSIS — F419 Anxiety disorder, unspecified: Secondary | ICD-10-CM | POA: Diagnosis not present

## 2017-03-31 DIAGNOSIS — H518 Other specified disorders of binocular movement: Secondary | ICD-10-CM | POA: Diagnosis present

## 2017-03-31 DIAGNOSIS — J189 Pneumonia, unspecified organism: Secondary | ICD-10-CM | POA: Diagnosis present

## 2017-03-31 DIAGNOSIS — N179 Acute kidney failure, unspecified: Secondary | ICD-10-CM | POA: Diagnosis present

## 2017-03-31 DIAGNOSIS — J988 Other specified respiratory disorders: Secondary | ICD-10-CM

## 2017-03-31 DIAGNOSIS — R6521 Severe sepsis with septic shock: Secondary | ICD-10-CM | POA: Diagnosis present

## 2017-03-31 DIAGNOSIS — B9620 Unspecified Escherichia coli [E. coli] as the cause of diseases classified elsewhere: Secondary | ICD-10-CM | POA: Diagnosis present

## 2017-03-31 DIAGNOSIS — D62 Acute posthemorrhagic anemia: Secondary | ICD-10-CM

## 2017-03-31 DIAGNOSIS — R633 Feeding difficulties: Secondary | ICD-10-CM | POA: Diagnosis present

## 2017-03-31 DIAGNOSIS — J811 Chronic pulmonary edema: Secondary | ICD-10-CM

## 2017-03-31 DIAGNOSIS — N390 Urinary tract infection, site not specified: Secondary | ICD-10-CM | POA: Diagnosis present

## 2017-03-31 DIAGNOSIS — T17908A Unspecified foreign body in respiratory tract, part unspecified causing other injury, initial encounter: Secondary | ICD-10-CM

## 2017-03-31 DIAGNOSIS — D6959 Other secondary thrombocytopenia: Secondary | ICD-10-CM | POA: Diagnosis present

## 2017-03-31 DIAGNOSIS — I480 Paroxysmal atrial fibrillation: Secondary | ICD-10-CM

## 2017-03-31 DIAGNOSIS — R519 Headache, unspecified: Secondary | ICD-10-CM

## 2017-03-31 DIAGNOSIS — I69398 Other sequelae of cerebral infarction: Secondary | ICD-10-CM

## 2017-03-31 DIAGNOSIS — R29719 NIHSS score 19: Secondary | ICD-10-CM | POA: Diagnosis present

## 2017-03-31 DIAGNOSIS — Z01818 Encounter for other preprocedural examination: Secondary | ICD-10-CM

## 2017-03-31 DIAGNOSIS — Z781 Physical restraint status: Secondary | ICD-10-CM

## 2017-03-31 DIAGNOSIS — J9621 Acute and chronic respiratory failure with hypoxia: Secondary | ICD-10-CM | POA: Diagnosis not present

## 2017-03-31 DIAGNOSIS — I952 Hypotension due to drugs: Secondary | ICD-10-CM | POA: Diagnosis not present

## 2017-03-31 DIAGNOSIS — Y92230 Patient room in hospital as the place of occurrence of the external cause: Secondary | ICD-10-CM | POA: Diagnosis not present

## 2017-03-31 DIAGNOSIS — T383X6A Underdosing of insulin and oral hypoglycemic [antidiabetic] drugs, initial encounter: Secondary | ICD-10-CM | POA: Diagnosis present

## 2017-03-31 DIAGNOSIS — R109 Unspecified abdominal pain: Secondary | ICD-10-CM

## 2017-03-31 DIAGNOSIS — E870 Hyperosmolality and hypernatremia: Secondary | ICD-10-CM | POA: Diagnosis present

## 2017-03-31 DIAGNOSIS — I10 Essential (primary) hypertension: Secondary | ICD-10-CM | POA: Diagnosis not present

## 2017-03-31 DIAGNOSIS — J9620 Acute and chronic respiratory failure, unspecified whether with hypoxia or hypercapnia: Secondary | ICD-10-CM

## 2017-03-31 DIAGNOSIS — R414 Neurologic neglect syndrome: Secondary | ICD-10-CM | POA: Diagnosis present

## 2017-03-31 DIAGNOSIS — M542 Cervicalgia: Secondary | ICD-10-CM

## 2017-03-31 DIAGNOSIS — Z6831 Body mass index (BMI) 31.0-31.9, adult: Secondary | ICD-10-CM

## 2017-03-31 DIAGNOSIS — J9811 Atelectasis: Secondary | ICD-10-CM | POA: Diagnosis present

## 2017-03-31 DIAGNOSIS — R51 Headache: Secondary | ICD-10-CM

## 2017-03-31 DIAGNOSIS — T465X5A Adverse effect of other antihypertensive drugs, initial encounter: Secondary | ICD-10-CM | POA: Diagnosis not present

## 2017-03-31 DIAGNOSIS — D72829 Elevated white blood cell count, unspecified: Secondary | ICD-10-CM

## 2017-03-31 DIAGNOSIS — R900 Intracranial space-occupying lesion found on diagnostic imaging of central nervous system: Secondary | ICD-10-CM | POA: Diagnosis present

## 2017-03-31 DIAGNOSIS — Z4659 Encounter for fitting and adjustment of other gastrointestinal appliance and device: Secondary | ICD-10-CM

## 2017-03-31 DIAGNOSIS — R059 Cough, unspecified: Secondary | ICD-10-CM

## 2017-03-31 DIAGNOSIS — G8194 Hemiplegia, unspecified affecting left nondominant side: Secondary | ICD-10-CM | POA: Diagnosis present

## 2017-03-31 DIAGNOSIS — J90 Pleural effusion, not elsewhere classified: Secondary | ICD-10-CM | POA: Diagnosis not present

## 2017-03-31 DIAGNOSIS — R1310 Dysphagia, unspecified: Secondary | ICD-10-CM | POA: Diagnosis present

## 2017-03-31 DIAGNOSIS — Z430 Encounter for attention to tracheostomy: Secondary | ICD-10-CM

## 2017-03-31 DIAGNOSIS — Z9289 Personal history of other medical treatment: Secondary | ICD-10-CM

## 2017-03-31 DIAGNOSIS — R253 Fasciculation: Secondary | ICD-10-CM | POA: Diagnosis not present

## 2017-03-31 DIAGNOSIS — I63511 Cerebral infarction due to unspecified occlusion or stenosis of right middle cerebral artery: Secondary | ICD-10-CM | POA: Diagnosis present

## 2017-03-31 DIAGNOSIS — J9600 Acute respiratory failure, unspecified whether with hypoxia or hypercapnia: Secondary | ICD-10-CM

## 2017-03-31 DIAGNOSIS — B951 Streptococcus, group B, as the cause of diseases classified elsewhere: Secondary | ICD-10-CM | POA: Diagnosis present

## 2017-03-31 DIAGNOSIS — G936 Cerebral edema: Secondary | ICD-10-CM

## 2017-03-31 DIAGNOSIS — E875 Hyperkalemia: Secondary | ICD-10-CM | POA: Diagnosis not present

## 2017-03-31 DIAGNOSIS — E669 Obesity, unspecified: Secondary | ICD-10-CM | POA: Diagnosis present

## 2017-03-31 DIAGNOSIS — Z0189 Encounter for other specified special examinations: Secondary | ICD-10-CM

## 2017-03-31 DIAGNOSIS — D6489 Other specified anemias: Secondary | ICD-10-CM | POA: Diagnosis present

## 2017-03-31 LAB — CORTISOL: Cortisol, Plasma: 28.7 ug/dL

## 2017-03-31 LAB — BASIC METABOLIC PANEL
ANION GAP: 11 (ref 5–15)
ANION GAP: 10 (ref 5–15)
ANION GAP: 4 — AB (ref 5–15)
ANION GAP: 5 (ref 5–15)
ANION GAP: 4 — AB (ref 5–15)
BUN: 43 mg/dL — ABNORMAL HIGH (ref 6–20)
BUN: 39 mg/dL — ABNORMAL HIGH (ref 6–20)
BUN: 34 mg/dL — ABNORMAL HIGH (ref 6–20)
BUN: 29 mg/dL — ABNORMAL HIGH (ref 6–20)
BUN: 25 mg/dL — ABNORMAL HIGH (ref 6–20)
CALCIUM: 7.7 mg/dL — AB (ref 8.9–10.3)
CALCIUM: 7.6 mg/dL — AB (ref 8.9–10.3)
CALCIUM: 7.9 mg/dL — AB (ref 8.9–10.3)
CHLORIDE: 110 mmol/L (ref 101–111)
CO2: 11 mmol/L — ABNORMAL LOW (ref 22–32)
CO2: 19 mmol/L — ABNORMAL LOW (ref 22–32)
CO2: 24 mmol/L (ref 22–32)
CO2: 24 mmol/L (ref 22–32)
CO2: 24 mmol/L (ref 22–32)
Calcium: 7.9 mg/dL — ABNORMAL LOW (ref 8.9–10.3)
Calcium: 7.9 mg/dL — ABNORMAL LOW (ref 8.9–10.3)
Chloride: 106 mmol/L (ref 101–111)
Chloride: 108 mmol/L (ref 101–111)
Chloride: 112 mmol/L — ABNORMAL HIGH (ref 101–111)
Chloride: 112 mmol/L — ABNORMAL HIGH (ref 101–111)
Creatinine, Ser: 1.68 mg/dL — ABNORMAL HIGH (ref 0.61–1.24)
Creatinine, Ser: 1.42 mg/dL — ABNORMAL HIGH (ref 0.61–1.24)
Creatinine, Ser: 1.21 mg/dL (ref 0.61–1.24)
Creatinine, Ser: 1.05 mg/dL (ref 0.61–1.24)
Creatinine, Ser: 0.86 mg/dL (ref 0.61–1.24)
GFR calc Af Amer: >60 mL/min (ref >60)
GFR, EST AFRICAN AMERICAN: 51 mL/min — AB (ref >60)
GFR, EST NON AFRICAN AMERICAN: 44 mL/min — AB (ref >60)
GFR, EST NON AFRICAN AMERICAN: 54 mL/min — AB (ref >60)
GLUCOSE: 495 mg/dL — AB (ref 65–99)
GLUCOSE: 146 mg/dL — AB (ref 65–99)
Glucose, Bld: 665 mg/dL (ref 65–99)
Glucose, Bld: 271 mg/dL — ABNORMAL HIGH (ref 65–99)
Glucose, Bld: 211 mg/dL — ABNORMAL HIGH (ref 65–99)
POTASSIUM: 4.7 mmol/L (ref 3.5–5.1)
POTASSIUM: 4 mmol/L (ref 3.5–5.1)
POTASSIUM: 3.6 mmol/L (ref 3.5–5.1)
POTASSIUM: 3.3 mmol/L — AB (ref 3.5–5.1)
POTASSIUM: 3.2 mmol/L — AB (ref 3.5–5.1)
SODIUM: 128 mmol/L — AB (ref 135–145)
SODIUM: 140 mmol/L (ref 135–145)
SODIUM: 141 mmol/L (ref 135–145)
SODIUM: 138 mmol/L (ref 135–145)
Sodium: 137 mmol/L (ref 135–145)

## 2017-03-31 LAB — URINALYSIS, ROUTINE W REFLEX MICROSCOPIC
BILIRUBIN URINE: NEGATIVE
Glucose, UA: 50 mg/dL — AB
KETONES UR: NEGATIVE mg/dL
Leukocytes, UA: NEGATIVE
Nitrite: NEGATIVE
PROTEIN: 30 mg/dL — AB
Specific Gravity, Urine: 1.018 (ref 1.005–1.030)
pH: 5 (ref 5.0–8.0)

## 2017-03-31 LAB — GLUCOSE, CAPILLARY
GLUCOSE-CAPILLARY: 201 mg/dL — AB (ref 65–99)
GLUCOSE-CAPILLARY: 141 mg/dL — AB (ref 65–99)
GLUCOSE-CAPILLARY: 143 mg/dL — AB (ref 65–99)
GLUCOSE-CAPILLARY: 174 mg/dL — AB (ref 65–99)
GLUCOSE-CAPILLARY: 203 mg/dL — AB (ref 65–99)
GLUCOSE-CAPILLARY: 175 mg/dL — AB (ref 65–99)
GLUCOSE-CAPILLARY: 242 mg/dL — AB (ref 65–99)
GLUCOSE-CAPILLARY: 283 mg/dL — AB (ref 65–99)
GLUCOSE-CAPILLARY: 290 mg/dL — AB (ref 65–99)
GLUCOSE-CAPILLARY: 258 mg/dL — AB (ref 65–99)
Glucose-Capillary: 519 mg/dL (ref 65–99)
Glucose-Capillary: 437 mg/dL — ABNORMAL HIGH (ref 65–99)
Glucose-Capillary: 378 mg/dL — ABNORMAL HIGH (ref 65–99)
Glucose-Capillary: 283 mg/dL — ABNORMAL HIGH (ref 65–99)
Glucose-Capillary: 261 mg/dL — ABNORMAL HIGH (ref 65–99)
Glucose-Capillary: 253 mg/dL — ABNORMAL HIGH (ref 65–99)
Glucose-Capillary: 146 mg/dL — ABNORMAL HIGH (ref 65–99)
Glucose-Capillary: 318 mg/dL — ABNORMAL HIGH (ref 65–99)
Glucose-Capillary: 215 mg/dL — ABNORMAL HIGH (ref 65–99)

## 2017-03-31 LAB — BLOOD GAS, ARTERIAL
ACID-BASE EXCESS: 0.6 mmol/L (ref 0.0–2.0)
Bicarbonate: 24.5 mmol/L (ref 20.0–28.0)
FIO2: 0.4
LHR: 18 {breaths}/min
MECHANICAL RATE: 18
MECHVT: 550 mL
Map: 9 cmH20
O2 Saturation: 99.4 %
PEEP: 5 cmH2O
PO2 ART: 148 mmHg — AB (ref 83.0–108.0)
Patient temperature: 37
pCO2 arterial: 36 mmHg (ref 32.0–48.0)
pH, Arterial: 7.44 (ref 7.350–7.450)

## 2017-03-31 LAB — CSF CELL COUNT WITH DIFFERENTIAL
EOS CSF: 0 %
LYMPHS CSF: 13 %
MONOCYTE-MACROPHAGE-SPINAL FLUID: 17 %
OTHER CELLS CSF: 0
RBC COUNT CSF: 16 /mm3 — AB (ref 0–3)
SEGMENTED NEUTROPHILS-CSF: 70 %
Tube #: 3
WBC CSF: 5 /mm3 (ref 0–5)

## 2017-03-31 LAB — CBC
HEMATOCRIT: 37.4 % — AB (ref 40.0–52.0)
HEMOGLOBIN: 12.6 g/dL — AB (ref 13.0–18.0)
MCH: 26.6 pg (ref 26.0–34.0)
MCHC: 33.6 g/dL (ref 32.0–36.0)
MCV: 79.2 fL — ABNORMAL LOW (ref 80.0–100.0)
Platelets: 134 10*3/uL — ABNORMAL LOW (ref 150–440)
RBC: 4.73 MIL/uL (ref 4.40–5.90)
RDW: 16.2 % — ABNORMAL HIGH (ref 11.5–14.5)
WBC: 27.1 10*3/uL — AB (ref 3.8–10.6)

## 2017-03-31 LAB — MAGNESIUM: MAGNESIUM: 1.8 mg/dL (ref 1.7–2.4)

## 2017-03-31 LAB — PROCALCITONIN: PROCALCITONIN: 4.28 ng/mL

## 2017-03-31 LAB — PROTIME-INR
INR: 1.12
Prothrombin Time: 14.5 seconds (ref 11.4–15.2)

## 2017-03-31 LAB — PROTEIN AND GLUCOSE, CSF
GLUCOSE CSF: 205 mg/dL — AB (ref 40–70)
TOTAL PROTEIN, CSF: 62 mg/dL — AB (ref 15–45)

## 2017-03-31 LAB — APTT: aPTT: 33 seconds (ref 24–36)

## 2017-03-31 LAB — POTASSIUM: POTASSIUM: 4.2 mmol/L (ref 3.5–5.1)

## 2017-03-31 LAB — PHOSPHORUS: PHOSPHORUS: 1.6 mg/dL — AB (ref 2.5–4.6)

## 2017-03-31 MED ORDER — POTASSIUM CHLORIDE 10 MEQ/50ML IV SOLN
10.0000 meq | INTRAVENOUS | Status: DC
  Filled 2017-03-31 (×4): qty 50

## 2017-03-31 MED ORDER — NOREPINEPHRINE BITARTRATE 1 MG/ML IV SOLN
0.0000 ug/min | INTRAVENOUS | Status: DC
  Administered 2017-03-31 (×3): 40 ug/min via INTRAVENOUS
  Filled 2017-03-31 (×4): qty 16

## 2017-03-31 MED ORDER — SODIUM CHLORIDE 0.9 % IV SOLN
0.0000 ug/min | INTRAVENOUS | Status: DC
  Administered 2017-03-31: 30 ug/min via INTRAVENOUS
  Filled 2017-03-31: qty 10

## 2017-03-31 MED ORDER — SODIUM CHLORIDE 0.9 % IV SOLN
2.0000 g | INTRAVENOUS | Status: DC
  Administered 2017-03-31 (×3): 2 g via INTRAVENOUS
  Filled 2017-03-31 (×7): qty 2000

## 2017-03-31 MED ORDER — SODIUM CHLORIDE 3 % IV SOLN
INTRAVENOUS | Status: DC
  Administered 2017-03-31: 50 mL/h via INTRAVENOUS
  Filled 2017-03-31 (×2): qty 500

## 2017-03-31 MED ORDER — MANNITOL 25 % IV SOLN
25.0000 g | Freq: Four times a day (QID) | INTRAVENOUS | Status: DC
  Filled 2017-03-31 (×3): qty 100

## 2017-03-31 MED ORDER — DEXTROSE 5 % IV SOLN
2.0000 g | Freq: Two times a day (BID) | INTRAVENOUS | Status: DC
  Administered 2017-03-31: 2 g via INTRAVENOUS
  Filled 2017-03-31 (×3): qty 2

## 2017-03-31 MED ORDER — MAGNESIUM SULFATE IN D5W 1-5 GM/100ML-% IV SOLN
1.0000 g | Freq: Once | INTRAVENOUS | Status: AC
  Administered 2017-03-31: 1 g via INTRAVENOUS
  Filled 2017-03-31: qty 100

## 2017-03-31 MED ORDER — ORAL CARE MOUTH RINSE
15.0000 mL | OROMUCOSAL | Status: DC
  Administered 2017-03-31 (×8): 15 mL via OROMUCOSAL

## 2017-03-31 MED ORDER — INSULIN GLARGINE 100 UNIT/ML ~~LOC~~ SOLN
35.0000 [IU] | Freq: Once | SUBCUTANEOUS | Status: AC
  Administered 2017-03-31: 35 [IU] via SUBCUTANEOUS
  Filled 2017-03-31: qty 0.35

## 2017-03-31 MED ORDER — INSULIN ASPART 100 UNIT/ML ~~LOC~~ SOLN: 2.0000 [IU] | SUBCUTANEOUS | Status: DC

## 2017-03-31 MED ORDER — SODIUM CHLORIDE 0.9 % IV SOLN
1250.0000 mg | Freq: Two times a day (BID) | INTRAVENOUS | Status: DC
  Administered 2017-03-31: 1250 mg via INTRAVENOUS
  Filled 2017-03-31 (×2): qty 1250

## 2017-03-31 MED ORDER — SODIUM CHLORIDE 0.9 % IV SOLN
INTRAVENOUS | Status: DC
  Administered 2017-03-31: 1.8 [IU]/h via INTRAVENOUS
  Filled 2017-03-31: qty 1

## 2017-03-31 MED ORDER — PIPERACILLIN-TAZOBACTAM 3.375 G IVPB: 3.3750 g | Freq: Four times a day (QID) | INTRAVENOUS | Status: DC

## 2017-03-31 MED ORDER — HYDROCORTISONE NA SUCCINATE PF 100 MG IJ SOLR
100.0000 mg | Freq: Three times a day (TID) | INTRAMUSCULAR | Status: DC
  Administered 2017-03-31: 100 mg via INTRAVENOUS
  Filled 2017-03-31: qty 2

## 2017-03-31 MED ORDER — POTASSIUM CHLORIDE 10 MEQ/100ML IV SOLN
10.0000 meq | INTRAVENOUS | Status: DC
  Filled 2017-03-31 (×4): qty 100

## 2017-03-31 MED ORDER — SODIUM CHLORIDE 0.9 % IV SOLN
0.0000 ug/min | INTRAVENOUS | Status: DC
  Administered 2017-03-31: 80 ug/min via INTRAVENOUS
  Administered 2017-03-31: 100 ug/min via INTRAVENOUS
  Administered 2017-03-31: 120 ug/min via INTRAVENOUS
  Filled 2017-03-31 (×5): qty 4

## 2017-03-31 MED ORDER — VANCOMYCIN HCL IN DEXTROSE 1-5 GM/200ML-% IV SOLN
1000.0000 mg | Freq: Once | INTRAVENOUS | Status: AC
  Administered 2017-03-31: 1000 mg via INTRAVENOUS
  Filled 2017-03-31: qty 200

## 2017-03-31 MED ORDER — SODIUM CHLORIDE 0.9 % IV SOLN
INTRAVENOUS | Status: DC
  Administered 2017-03-31: 15:00:00 via INTRAVENOUS

## 2017-03-31 MED ORDER — SODIUM CHLORIDE 0.9 % IV BOLUS (SEPSIS)
1000.0000 mL | Freq: Once | INTRAVENOUS | Status: AC
  Administered 2017-03-31: 1000 mL via INTRAVENOUS

## 2017-03-31 MED ORDER — CHLORHEXIDINE GLUCONATE 0.12% ORAL RINSE (MEDLINE KIT)
15.0000 mL | Freq: Two times a day (BID) | OROMUCOSAL | Status: DC
  Administered 2017-03-31 (×3): 15 mL via OROMUCOSAL

## 2017-03-31 MED ORDER — POTASSIUM CHLORIDE 20 MEQ/15ML (10%) PO SOLN
40.0000 meq | Freq: Once | ORAL | Status: AC
  Administered 2017-03-31: 40 meq
  Filled 2017-03-31: qty 30

## 2017-03-31 MED FILL — Medication: Qty: 1 | Status: AC

## 2017-03-31 NOTE — Progress Notes (Signed)
IO removed from left tibia.

## 2017-03-31 NOTE — Care Management (Signed)
Patient demographics, Social Security number, and insurance information is not currently updated. RNCM team to continue to follow.

## 2017-03-31 NOTE — Procedures (Signed)
LP Procedure Note:  Patient has been seen and examined.  Chart has been reviewed.  LP is being performed to rule out infection.  Procedure has been explained to family including risks and benefits.  Consent has been signed by family and witnessed. A time out was performed.  Blood pressure (!) 116/58, pulse 73, temperature 98.6 F (37 C), resp. rate 18, height 5' 10" (1.778 m), weight 113.4 kg (250 lb), SpO2 99 %.   Current Facility-Administered Medications:  .  0.9 %  sodium chloride infusion, , Intravenous, Continuous, Baxter Hire, MD, Last Rate: 150 mL/hr at 03/31/17 0318 .  ampicillin (OMNIPEN) 2 g in sodium chloride 0.9 % 50 mL IVPB, 2 g, Intravenous, Q4H, Conforti, John, DO, Stopped at 03/31/17 1325 .  cefTRIAXone (ROCEPHIN) 2 g in dextrose 5 % 50 mL IVPB, 2 g, Intravenous, Q12H, Conforti, John, DO, Stopped at 03/31/17 1414 .  chlorhexidine gluconate (MEDLINE KIT) (PERIDEX) 0.12 % solution 15 mL, 15 mL, Mouth Rinse, BID, Varughese, Bincy S, NP, 15 mL at 03/31/17 0847 .  dextrose 5 %-0.45 % sodium chloride infusion, , Intravenous, Continuous, Baxter Hire, MD, Last Rate: 100 mL/hr at 03/31/17 0739, 100 mL/hr at 03/31/17 0739 .  fentaNYL (SUBLIMAZE) bolus via infusion 50 mcg, 50 mcg, Intravenous, Q1H PRN, Varughese, Bincy S, NP .  fentaNYL 2528mg in NS 2524m(1055mml) infusion-PREMIX, 25-400 mcg/hr, Intravenous, Continuous, Varughese, Bincy S, NP, Last Rate: 7.5 mL/hr at 03/31/17 1336, 75 mcg/hr at 03/31/17 1336 .  heparin injection 5,000 Units, 5,000 Units, Subcutaneous, Q8H, JohBaxter HireD, 5,000 Units at 03/31/17 1449 .  hydrocortisone sodium succinate (SOLU-CORTEF) 100 MG injection 100 mg, 100 mg, Intravenous, Q8H, Conforti, John, DO, 100 mg at 03/31/17 1253 .  insulin aspart (novoLOG) injection 2-6 Units, 2-6 Units, Subcutaneous, Q4H, Conforti, John, DO .  magnesium sulfate IVPB 1 g 100 mL, 1 g, Intravenous, Once, ChrNapoleon FormPH, 1 g at 03/31/17 1443 .  MEDLINE  mouth rinse, 15 mL, Mouth Rinse, 10 times per day, Varughese, Bincy S, NP, 15 mL at 03/31/17 1258 .  midazolam (VERSED) 50 mg in sodium chloride 0.9 % 50 mL (1 mg/mL) infusion, 1-10 mg/hr, Intravenous, Continuous, Varughese, Bincy S, NP, Stopped at 03/31/17 0815 .  midazolam (VERSED) injection 2 mg, 2 mg, Intravenous, Q15 min PRN, Varughese, Bincy S, NP, 2 mg at 03/30/17 2213 .  midazolam (VERSED) injection 2 mg, 2 mg, Intravenous, Q2H PRN, Varughese, Bincy S, NP .  norepinephrine (LEVOPHED) 16 mg in dextrose 5 % 250 mL (0.064 mg/mL) infusion, 0-40 mcg/min, Intravenous, Titrated, Varughese, Bincy S, NP, Last Rate: 37.5 mL/hr at 03/31/17 0928, 40 mcg/min at 03/31/17 0928 .  pantoprazole (PROTONIX) injection 40 mg, 40 mg, Intravenous, Q24H, WerTanda RockersD, 40 mg at 03/30/17 2316 .  phenylephrine (NEO-SYNEPHRINE) 40 mg in sodium chloride 0.9 % 250 mL (0.16 mg/mL) infusion, 0-400 mcg/min, Intravenous, Titrated, Varughese, Bincy S, NP, Last Rate: 39.4 mL/hr at 03/31/17 1354, 105 mcg/min at 03/31/17 1354 .  vancomycin (VANCOCIN) 1,250 mg in sodium chloride 0.9 % 250 mL IVPB, 1,250 mg, Intravenous, Q12H, [facility]  03/30/17 1808 03/31/17 0151 03/31/17 1114  WBC 24.0* 27.1*  --   HGB 14.6 12.6*  --   HCT 49.0 37.4*  --   PLT 159 134*  --   INR  --   --  1.12    CT of head:  No acute intracranial abnormality identified. Unremarkable CT of the  brain.    Patient was placed in the lateral decub/sitting position.  Area was cleaned with betadine.  Under sterile conditions 20G LP needle was placed at approximately L3-4 without difficulty.  Opening pressure was documented at 36.  Closing pressure at 17.  Approximately 20cc of clear, colorless fluid was obtained and sent for studies.  No complications were noted.    Alexis Goodell, MD Neurology [phone] 03/31/2017  2:50 PM

## 2017-03-31 NOTE — Progress Notes (Signed)
Initial Nutrition Assessment  DOCUMENTATION CODES:   Obesity unspecified  INTERVENTION:  Patient currently on high doses of multiple pressors, await hemodynamic stability before beginning tubefeeds.  When appropriate, recommend begin Vital High protein at 41mL/hr increase by 10 every 8 hours to goal rate of 2mL/hr  Pro-stat 45mL BID, each supplement provides 100 calories and 15 grams of protein  Regimen provides 1480 calories, 155 grams protein, 964mL free water  NUTRITION DIAGNOSIS:   Inadequate oral intake related to inability to eat (Intubated, sedated on vent) as evidenced by NPO status.  GOAL:   Provide needs based on ASPEN/SCCM guidelines  MONITOR:   I & O's, Labs, Skin, Weight trends  REASON FOR ASSESSMENT:   Ventilator    ASSESSMENT:   Joe Wilkinson is a 56 yo male who was found unresponsive, presents with DKA - t1dm, severe acidosis, AKI, and hypovolemic shock.  Discussed in rounds. Possible lumbar puncture today.  On multiple pressors. No diet hx available.   Intake/Output Summary (Last 24 hours) at 03/31/17 1042 Last data filed at 03/31/17 0851  Gross per 24 hour  Intake          4697.33 ml  Output             4085 ml  Net           612.33 ml    Access: LIJ CVC OGT tip to proximal stomach Abdomen appears soft but distended.  Patient is currently intubated on ventilator support MV: 9.3 L/min Temp (24hrs), Avg:98 F (36.7 C), Min:92.8 F (33.8 C), Max:100.9 F (38.3 C) Propofol: None  Labs reviewed:  CBGs 201, 253, 261 BUN 34, Phos 1.6, Lipase 181, TBili 2.3  Medications reviewed and include:  D5 1/2 NS at 167mL/hr --> 408 calories Fentanyl gtt, Novolin gtt, Versed gtt, Levo gtt, Neo gtt  Diet Order:  Diet NPO time specified  Skin:  Reviewed, no issues  Last BM:  PTA  Height:   Ht Readings from Last 1 Encounters:  03/30/17 5\' 10"  (1.778 m)    Weight:   Wt Readings from Last 1 Encounters:  03/30/17 250 lb (113.4 kg)    Ideal  Body Weight:  75.45 kg  BMI:  Body mass index is 35.87 kg/m.  Estimated Nutritional Needs:   Kcal:  2725-3664 calories (11-14 cal/kg ABW)  Protein:  >/= 151 grams (2g/kg IBW)  Fluid:  > 1.5L  EDUCATION NEEDS:   No education needs identified at this time  Satira Anis. Pascha Fogal, MS, RD LDN Inpatient Clinical Dietitian Pager [phone]

## 2017-03-31 NOTE — Consult Note (Signed)
Chief Complaint   Chief Complaint  Patient presents with  . Hyperglycemia   HPI   HPI: Joe Wilkinson is a 56 y.o. male who was brought to [facility] ER 5/23 after [place] and later unresponsive. He was found to be in DKA (glucose >1100) with severe acidosis. Intubated due to dyspnea likely secondary to severe acidosis. Was also found to have AKI and leukocytosis. Note from today 2004 states "Pt awake and alert.  Pt not moving left side of body including pain.  He eyes are deviated to the right.  He has left-sided neglect.  He follows commands on the right side." Unsure exact known normal time based. 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 [facility] Neurosurgery consulted for evaluation of possible craniectomy. Pt is currently intubated.   Patient Active Problem List   Diagnosis Date Noted  . D[place] (di[place]oses[place]  . Encounter for central line placement   . Encounter for orogastric (OG) tube placement   . Hypovolemic shock (HCC)     PMH: No past medical history on file.  PSH: No past surgical history on file.  No prescriptions prior to admission.    SH: Social History  Substance Use Topics  . Smoking status: Not on file  . Smokeless tobacco: Not on file  . Alcohol use Not on file    MEDS: Prior to Admission medications   Not on File    ALLERGY: No Known Allergies  Social History  Substance Use Topics  . Smoking status: Not on file  . Smokeless tobacco: Not on file  . Alcohol use Not on file     No family history on file.   ROS   ROS unable to obtain secondary to intubation  Exam   Vitals:   03/31/17 2100 03/31/17 2133  BP: (!) 167/97 (!) 150/90  Pulse:  71  Resp: 13 (!) 22  Temp: 98.6 F (37 C) (!) 97.1 F (36.2 C)  SpO2:  99%   Intubated Head tilted to right, rightward gaze Both  pupils reactive, right sluggish Follows commands with RUE. Able to wiggle toes and grip on right.  No movement with LUE/LLE  Results - Imaging/Labs   Results for orders placed or performed during the hospital encounter of 03/30/17 (from the past 48 hour(s))  CBC     Status: Abnormal   Collection Time: 03/30/17  6:08 PM  Result Value Ref Range   WBC 24.0 (H) 3.8 - 10.6 K/uL   RBC 5.50 4.40 - 5.90 MIL/uL   Hemoglobin 14.6 13.0 - 18.0 g/dL    Comment: RESULT REPEATED AND VERIFIED   HCT 49.0 40.0 - 52.0 %   MCV 89.2 80.0 - 100.0 fL   MCH 26.6 26.0 - 34.0 pg   MCHC 29.8 (L) 32.0 - 36.0 g/dL   RDW 17.5 (H) 11.5 - 14.5 %   Platelets 159 150 - 440 K/uL    Comment: PLATELET COUNT CONFIRMED BY SMEAR GIANT PLATELETS SEEN   Comprehensive metabolic panel     Status: Abnormal   Collection Time: 03/30/17  6:08 PM  Result Value Ref Range   Sodium 115 (LL) 135 - 145 mmol/L    Comment: CRITICAL RESULT CALLED TO, READ BACK BY AND VERIFIED WITH DR PADUCHOWSKI @ 1837 03/30/17 BY [facility]    Potassium 6.2 (H) 3.5 - 5.1 mmol/L  Chloride 83 (L) 101 - 111 mmol/L   CO2 <7 (L) 22 - 32 mmol/L   Glucose, Bld 1,132 (HH) 65 - 99 mg/dL    Comment: CRITICAL RESULT CALLED TO, READ BACK BY AND VERIFIED WITH DR PADUCHOWSKI @ 1837 03/30/17 BY [facility]    BUN 52 (H) 6 - 20 mg/dL   Creatinine, Ser 2.54 (H) 0.61 - 1.24 mg/dL   Calcium 9.4 8.9 - 10.3 mg/dL   Total Protein 7.6 6.5 - 8.1 g/dL   Albumin 3.3 (L) 3.5 - 5.0 g/dL   AST 23 15 - 41 U/L   ALT 14 (L) 17 - 63 U/L   Alkaline Phosphatase 94 38 - 126 U/L   Total Bilirubin 2.3 (H) 0.3 - 1.2 mg/dL   GFR calc non Af Amer 27 (L) >60 mL/min   GFR calc Af Amer 31 (L) >60 mL/min    Comment: (NOTE) The eGFR has been calculated using the CKD EPI equation. This calculation has not been validated in all clinical situations. eGFR's persistently <60 mL/min signify possible Chronic Kidney Disease.    Anion gap NOT CALCULATED 5 - 15  Lipase, blood     Status: Abnormal    Collection Time: 03/30/17  6:08 PM  Result Value Ref Range   Lipase 181 (H) 11 - 51 U/L  Troponin I     Status: None   Collection Time: 03/30/17  6:08 PM  Result Value Ref Range   Troponin I <0.03 <0.03 ng/mL  Urinalysis, Complete w Microscopic     Status: Abnormal   Collection Time: 03/30/17  6:08 PM  Result Value Ref Range   Color, Urine YELLOW (A) YELLOW   APPearance CLEAR (A) CLEAR   Specific Gravity, Urine 1.022 1.005 - 1.030   pH 6.0 5.0 - 8.0   Glucose, UA >=500 (A) NEGATIVE mg/dL   Hgb urine dipstick MODERATE (A) NEGATIVE   Bilirubin Urine NEGATIVE NEGATIVE   Ketones, ur 20 (A) NEGATIVE mg/dL   Protein, ur 100 (A) NEGATIVE mg/dL   Nitrite NEGATIVE NEGATIVE   Leukocytes, UA NEGATIVE NEGATIVE   RBC / HPF 0-5 0 - 5 RBC/hpf   WBC, UA 0-5 0 - 5 WBC/hpf   Bacteria, UA NONE SEEN NONE SEEN   Squamous Epithelial / LPF 0-5 (A) NONE SEEN   Mucus PRESENT    Hyaline Casts, UA PRESENT   Blood gas, venous     Status: Abnormal (Preliminary result)   Collection Time: 03/30/17  6:10 PM  Result Value Ref Range   FIO2 0.21    pH, Ven 6.94 (LL) 7.250 - 7.430    Comment: CRITICAL RESULT CALLED TO, READ BACK BY AND VERIFIED WITH: PADUCHOWSKI AT 1839, 03/30/2017, LS    pCO2, Ven 19 (LL) 44.0 - 60.0 mmHg    Comment: CRITICAL RESULT CALLED TO, READ BACK BY AND VERIFIED WITH:  PADUSHOWSKI AT 1839, 03/30/2017, LS    pO2, Ven 50.0 (H) 32.0 - 45.0 mmHg   Bicarbonate PENDING 20.0 - 28.0 mmol/L   O2 Saturation PENDING %   Patient temperature 37.0    Collection site VENOUS    Sample type VENOUS   Glucose, capillary     Status: Abnormal   Collection Time: 03/30/17  6:14 PM  Result Value Ref Range   Glucose-Capillary >600 (HH) 65 - 99 mg/dL  Glucose, capillary     Status: Abnormal   Collection Time: 03/30/17  6:55 PM  Result Value Ref Range   Glucose-Capillary >600 (HH) 65 - 99 mg/dL  Glucose, capillary     Status: Abnormal   Collection Time: 03/30/17  9:30 PM  Result Value Ref Range    Glucose-Capillary >600 (HH) 65 - 99 mg/dL  MRSA PCR Screening     Status: None   Collection Time: 03/30/17 10:01 PM  Result Value Ref Range   MRSA by PCR NEGATIVE NEGATIVE    Comment:        The GeneXpert MRSA Assay (FDA approved for NASAL specimens only), is one component of a comprehensive MRSA colonization surveillance program. It is not intended to diagnose MRSA infection nor to guide or monitor treatment for MRSA infections.   Basic metabolic panel     Status: Abnormal   Collection Time: 03/30/17 10:48 PM  Result Value Ref Range   Sodium 128 (L) 135 - 145 mmol/L   Potassium 4.7 3.5 - 5.1 mmol/L   Chloride 106 101 - 111 mmol/L   CO2 11 (L) 22 - 32 mmol/L   Glucose, Bld 665 (HH) 65 - 99 mg/dL    Comment: CRITICAL RESULT CALLED TO, READ BACK BY AND VERIFIED WITH CHELSEA WATT AT 004 03/31/17 ALV    BUN 43 (H) 6 - 20 mg/dL   Creatinine, Ser 1.68 (H) 0.61 - 1.24 mg/dL   Calcium 7.9 (L) 8.9 - 10.3 mg/dL   GFR calc non Af Amer 44 (L) >60 mL/min   GFR calc Af Amer 51 (L) >60 mL/min    Comment: (NOTE) The eGFR has been calculated using the CKD EPI equation. This calculation has not been validated in all clinical situations. eGFR's persistently <60 mL/min signify possible Chronic Kidney Disease.    Anion gap 11 5 - 15  Glucose, capillary     Status: Abnormal   Collection Time: 03/30/17 10:56 PM  Result Value Ref Range   Glucose-Capillary >600 (HH) 65 - 99 mg/dL  Blood gas, arterial     Status: Abnormal   Collection Time: 03/30/17 11:21 PM  Result Value Ref Range   FIO2 1.00    Delivery systems VENTILATOR    Mode PRESSURE REGULATED VOLUME CONTROL    VT 550 mL   LHR 30 resp/min   Peep/cpap 5.0 cm H20   pH, Arterial 7.21 (L) 7.350 - 7.450    Comment: CRITICAL RESULT CALLED TO, READ BACK BY AND VERIFIED WITH:  CRITICAL RESULTS CALLED READ BACK AND VERIFIED TO BINCY NP [id number] AT 1140    pCO2 arterial 21 (L) 32.0 - 48.0 mmHg   pO2, Arterial 154 (H) 83.0 - 108.0 mmHg    Bicarbonate 8.4 (L) 20.0 - 28.0 mmol/L   Acid-base deficit 17.5 (H) 0.0 - 2.0 mmol/L   O2 Saturation 99.0 %   Patient temperature 37.0    Map 7.0 cmH20   Collection site LEFT RADIAL    Sample type ARTERIAL DRAW    Allens test (pass/fail) PASS PASS   Mechanical Rate 18   Glucose, capillary     Status: Abnormal   Collection Time: 03/30/17 11:57 PM  Result Value Ref Range   Glucose-Capillary >600 (HH) 65 - 99 mg/dL  Glucose, capillary     Status: Abnormal   Collection Time: 03/31/17  1:00 AM  Result Value Ref Range   Glucose-Capillary 519 (HH) 65 - 99 mg/dL  Basic metabolic panel     Status: Abnormal   Collection Time: 03/31/17  1:51 AM  Result Value Ref Range   Sodium 137 135 - 145 mmol/L   Potassium 4.0 3.5 - 5.1 mmol/L   Chloride  108 101 - 111 mmol/L   CO2 19 (L) 22 - 32 mmol/L   Glucose, Bld 495 (H) 65 - 99 mg/dL   BUN 39 (H) 6 - 20 mg/dL   Creatinine, Ser 1.42 (H) 0.61 - 1.24 mg/dL   Calcium 7.7 (L) 8.9 - 10.3 mg/dL   GFR calc non Af Amer 54 (L) >60 mL/min   GFR calc Af Amer >60 >60 mL/min    Comment: (NOTE) The eGFR has been calculated using the CKD EPI equation. This calculation has not been validated in all clinical situations. eGFR's persistently <60 mL/min signify possible Chronic Kidney Disease.    Anion gap 10 5 - 15  CBC     Status: Abnormal   Collection Time: 03/31/17  1:51 AM  Result Value Ref Range   WBC 27.1 (H) 3.8 - 10.6 K/uL   RBC 4.73 4.40 - 5.90 MIL/uL   Hemoglobin 12.6 (L) 13.0 - 18.0 g/dL   HCT 37.4 (L) 40.0 - 52.0 %   MCV 79.2 (L) 80.0 - 100.0 fL   MCH 26.6 26.0 - 34.0 pg   MCHC 33.6 32.0 - 36.0 g/dL   RDW 16.2 (H) 11.5 - 14.5 %   Platelets 134 (L) 150 - 440 K/uL  Magnesium     Status: None   Collection Time: 03/31/17  1:51 AM  Result Value Ref Range   Magnesium 1.8 1.7 - 2.4 mg/dL  Phosphorus     Status: Abnormal   Collection Time: 03/31/17  1:51 AM  Result Value Ref Range   Phosphorus 1.6 (L) 2.5 - 4.6 mg/dL  Glucose, capillary      Status: Abnormal   Collection Time: 03/31/17  2:07 AM  Result Value Ref Range   Glucose-Capillary 437 (H) 65 - 99 mg/dL  Glucose, capillary     Status: Abnormal   Collection Time: 03/31/17  3:01 AM  Result Value Ref Range   Glucose-Capillary 378 (H) 65 - 99 mg/dL  Glucose, capillary     Status: Abnormal   Collection Time: 03/31/17  4:05 AM  Result Value Ref Range   Glucose-Capillary 283 (H) 65 - 99 mg/dL  Glucose, capillary     Status: Abnormal   Collection Time: 03/31/17  5:18 AM  Result Value Ref Range   Glucose-Capillary 261 (H) 65 - 99 mg/dL  Blood gas, arterial     Status: Abnormal   Collection Time: 03/31/17  5:42 AM  Result Value Ref Range   FIO2 0.40    Delivery systems VENTILATOR    Mode PRESSURE REGULATED VOLUME CONTROL    VT 550 mL   LHR 18 resp/min   Peep/cpap 5.0 cm H20   pH, Arterial 7.44 7.350 - 7.450   pCO2 arterial 36 32.0 - 48.0 mmHg   pO2, Arterial 148 (H) 83.0 - 108.0 mmHg   Bicarbonate 24.5 20.0 - 28.0 mmol/L   Acid-Base Excess 0.6 0.0 - 2.0 mmol/L   O2 Saturation 99.4 %   Patient temperature 37.0    Map 9.0 cmH20   Collection site LEFT RADIAL    Sample type ARTERIAL DRAW    Allens test (pass/fail) PASS PASS   Mechanical Rate 18   Basic metabolic panel     Status: Abnormal   Collection Time: 03/31/17  5:46 AM  Result Value Ref Range   Sodium 140 135 - 145 mmol/L   Potassium 3.6 3.5 - 5.1 mmol/L   Chloride 112 (H) 101 - 111 mmol/L   CO2 24 22 - 32 mmol/L  Glucose, Bld 271 (H) 65 - 99 mg/dL   BUN 34 (H) 6 - 20 mg/dL   Creatinine, Ser 1.21 0.61 - 1.24 mg/dL   Calcium 7.6 (L) 8.9 - 10.3 mg/dL   GFR calc non Af Amer >60 >60 mL/min   GFR calc Af Amer >60 >60 mL/min    Comment: (NOTE) The eGFR has been calculated using the CKD EPI equation. This calculation has not been validated in all clinical situations. eGFR's persistently <60 mL/min signify possible Chronic Kidney Disease.    Anion gap 4 (L) 5 - 15  Glucose, capillary     Status: Abnormal    Collection Time: 03/31/17  6:13 AM  Result Value Ref Range   Glucose-Capillary 253 (H) 65 - 99 mg/dL  Glucose, capillary     Status: Abnormal   Collection Time: 03/31/17  7:33 AM  Result Value Ref Range   Glucose-Capillary 201 (H) 65 - 99 mg/dL  Glucose, capillary     Status: Abnormal   Collection Time: 03/31/17  9:34 AM  Result Value Ref Range   Glucose-Capillary 146 (H) 65 - 99 mg/dL  Basic metabolic panel     Status: Abnormal   Collection Time: 03/31/17  9:45 AM  Result Value Ref Range   Sodium 141 135 - 145 mmol/L   Potassium 3.3 (L) 3.5 - 5.1 mmol/L   Chloride 112 (H) 101 - 111 mmol/L   CO2 24 22 - 32 mmol/L   Glucose, Bld 146 (H) 65 - 99 mg/dL   BUN 29 (H) 6 - 20 mg/dL   Creatinine, Ser 1.05 0.61 - 1.24 mg/dL   Calcium 7.9 (L) 8.9 - 10.3 mg/dL   GFR calc non Af Amer >60 >60 mL/min   GFR calc Af Amer >60 >60 mL/min    Comment: (NOTE) The eGFR has been calculated using the CKD EPI equation. This calculation has not been validated in all clinical situations. eGFR's persistently <60 mL/min signify possible Chronic Kidney Disease.    Anion gap 5 5 - 15  Urinalysis, Routine w reflex microscopic     Status: Abnormal   Collection Time: 03/31/17  9:45 AM  Result Value Ref Range   Color, Urine YELLOW (A) YELLOW   APPearance CLOUDY (A) CLEAR   Specific Gravity, Urine 1.018 1.005 - 1.030   pH 5.0 5.0 - 8.0   Glucose, UA 50 (A) NEGATIVE mg/dL   Hgb urine dipstick MODERATE (A) NEGATIVE   Bilirubin Urine NEGATIVE NEGATIVE   Ketones, ur NEGATIVE NEGATIVE mg/dL   Protein, ur 30 (A) NEGATIVE mg/dL   Nitrite NEGATIVE NEGATIVE   Leukocytes, UA NEGATIVE NEGATIVE   RBC / HPF 6-30 0 - 5 RBC/hpf   WBC, UA 0-5 0 - 5 WBC/hpf   Bacteria, UA RARE (A) NONE SEEN   Squamous Epithelial / LPF 0-5 (A) NONE SEEN   Mucus PRESENT    RBC Casts, UA PRESENT   Cortisol     Status: None   Collection Time: 03/31/17  9:45 AM  Result Value Ref Range   Cortisol, Plasma 28.7 ug/dL    Comment:  (NOTE) AM    6.7 - 22.6 ug/dL PM   <10.0       ug/dL Performed at [facility], [address], [place], [place] [address]   Culture, respiratory (NON-Expectorated)     Status: None (Preliminary result)   Collection Time: 03/31/17 10:15 AM  Result Value Ref Range   Specimen Description TRACHEAL ASPIRATE    Special Requests  NONE    Gram Stain      ABUNDANT WBC PRESENT, PREDOMINANTLY PMN ABUNDANT GRAM POSITIVE COCCI IN PAIRS IN CHAINS RARE GRAM POSITIVE RODS RARE GRAM NEGATIVE COCCI IN PAIRS Performed at [facility], [place] [address], [place],  [address]    Culture PENDING    Report Status PENDING   Glucose, capillary     Status: Abnormal   Collection Time: 03/31/17 10:39 AM  Result Value Ref Range   Glucose-Capillary 141 (H) 65 - 99 mg/dL  Protime-INR     Status: None   Collection Time: 03/31/17 11:14 AM  Result Value Ref Range   Prothrombin Time 14.5 11.4 - 15.2 seconds   INR 1.12   APTT     Status: None   Collection Time: 03/31/17 11:14 AM  Result Value Ref Range   aPTT 33 24 - 36 seconds  Procalcitonin - Baseline     Status: None   Collection Time: 03/31/17 11:14 AM  Result Value Ref Range   Procalcitonin 4.28 ng/mL    Comment:        Interpretation: PCT > 2 ng/mL: Systemic infection (sepsis) is likely, unless other causes are known. (NOTE)         ICU PCT Algorithm               Non ICU PCT Algorithm    ----------------------------     ------------------------------         PCT < 0.25 ng/mL                 PCT < 0.1 ng/mL     Stopping of antibiotics            Stopping of antibiotics       strongly encouraged.               strongly encouraged.    ----------------------------     ------------------------------       PCT level decrease by               PCT < 0.25 ng/mL       >= 80% from peak PCT       OR PCT 0.25 - 0.5 ng/mL          Stopping of antibiotics                                             encouraged.     Stopping of antibiotics            encouraged.    ----------------------------     ------------------------------       PCT level decrease by              PCT >= 0.25 ng/mL       < 80% from peak PCT        AND PCT >= 0.5 ng/mL            Continuing antibiotics                                               encouraged.       Continuing antibiotics            encouraged.    ----------------------------     ------------------------------  PCT level increase compared          PCT > 0.5 ng/mL         with peak PCT AND          PCT >= 0.5 ng/mL             Escalation of antibiotics                                          strongly encouraged.      Escalation of antibiotics        strongly encouraged.   Glucose, capillary     Status: Abnormal   Collection Time: 03/31/17 11:34 AM  Result Value Ref Range   Glucose-Capillary 143 (H) 65 - 99 mg/dL  Glucose, capillary     Status: Abnormal   Collection Time: 03/31/17 12:27 PM  Result Value Ref Range   Glucose-Capillary 174 (H) 65 - 99 mg/dL  Basic metabolic panel     Status: Abnormal   Collection Time: 03/31/17  1:04 PM  Result Value Ref Range   Sodium 138 135 - 145 mmol/L   Potassium 3.2 (L) 3.5 - 5.1 mmol/L   Chloride 110 101 - 111 mmol/L   CO2 24 22 - 32 mmol/L   Glucose, Bld 211 (H) 65 - 99 mg/dL   BUN 25 (H) 6 - 20 mg/dL   Creatinine, Ser 0.86 0.61 - 1.24 mg/dL   Calcium 7.9 (L) 8.9 - 10.3 mg/dL   GFR calc non Af Amer >60 >60 mL/min   GFR calc Af Amer >60 >60 mL/min    Comment: (NOTE) The eGFR has been calculated using the CKD EPI equation. This calculation has not been validated in all clinical situations. eGFR's persistently <60 mL/min signify possible Chronic Kidney Disease.    Anion gap 4 (L) 5 - 15  Glucose, capillary     Status: Abnormal   Collection Time: 03/31/17  1:33 PM  Result Value Ref Range   Glucose-Capillary 203 (H) 65 - 99 mg/dL  Glucose, capillary     Status: Abnormal   Collection Time: 03/31/17  2:40 PM  Result Value Ref Range    Glucose-Capillary 175 (H) 65 - 99 mg/dL  CSF cell count with differential     Status: Abnormal   Collection Time: 03/31/17  2:45 PM  Result Value Ref Range   Tube # 3    Color, CSF CLEAR (A) COLORLESS   Appearance, CSF COLORLESS (A) CLEAR   RBC Count, CSF 16 (H) 0 - 3 /cu mm   WBC, CSF 5 0 - 5 /cu mm   Segmented Neutrophils-CSF 70 %   Lymphs, CSF 13 %   Monocyte-Macrophage-Spinal Fluid 17 %   Eosinophils, CSF 0 %   Other Cells, CSF 0   CSF culture     Status: None (Preliminary result)   Collection Time: 03/31/17  2:45 PM  Result Value Ref Range   Specimen Description LUMBAR    Special Requests Normal    Gram Stain NO ORGANISMS SEEN RARE WBC NO RBC     Culture PENDING    Report Status PENDING   Protein and glucose, CSF     Status: Abnormal   Collection Time: 03/31/17  2:45 PM  Result Value Ref Range   Glucose, CSF 205 (H) 40 - 70 mg/dL   Total  Protein, CSF 62 (H) 15 - 45  mg/dL  Glucose, capillary     Status: Abnormal   Collection Time: 03/31/17  4:03 PM  Result Value Ref Range   Glucose-Capillary 242 (H) 65 - 99 mg/dL  Potassium     Status: None   Collection Time: 03/31/17  5:08 PM  Result Value Ref Range   Potassium 4.2 3.5 - 5.1 mmol/L  Glucose, capillary     Status: Abnormal   Collection Time: 03/31/17  5:49 PM  Result Value Ref Range   Glucose-Capillary 283 (H) 65 - 99 mg/dL  Glucose, capillary     Status: Abnormal   Collection Time: 03/31/17  6:55 PM  Result Value Ref Range   Glucose-Capillary 318 (H) 65 - 99 mg/dL   Comment 1 Notify RN   Glucose, capillary     Status: Abnormal   Collection Time: 03/31/17  8:00 PM  Result Value Ref Range   Glucose-Capillary 290 (H) 65 - 99 mg/dL   Comment 1 Notify RN    Comment 2 Document in Chart   Glucose, capillary     Status: Abnormal   Collection Time: 03/31/17  9:13 PM  Result Value Ref Range   Glucose-Capillary 258 (H) 65 - 99 mg/dL  Glucose, capillary     Status: Abnormal   Collection Time: 03/31/17  9:59 PM   Result Value Ref Range   Glucose-Capillary 215 (H) 65 - 99 mg/dL   Comment 1 Notify RN     Dg Chest 1 View  Result Date: 03/30/2017 CLINICAL DATA:  Central line placement. EXAM: CHEST 1 VIEW COMPARISON:  Radiograph same day. FINDINGS: The heart size and mediastinal contours are within normal limits. Both lungs are clear. No pneumothorax or pleural effusion is noted. Endotracheal and nasogastric tubes are unchanged in position. Interval placement of left internal jugular catheter, with distal tip in expected position of SVC. The visualized skeletal structures are unremarkable. IMPRESSION: Stable support apparatus. Interval placement of left internal jugular catheter, with distal tip in expected position of SVC. No acute cardiopulmonary abnormality seen. Electronically Signed   By: Marijo Conception, M.D.   On: 03/30/2017 23:19   Dg Abd 1 View  Result Date: 03/30/2017 CLINICAL DATA:  Orogastric tube placement. EXAM: ABDOMEN - 1 VIEW COMPARISON:  None. FINDINGS: The bowel gas pattern is normal. No radio-opaque calculi or other significant radiographic abnormality are seen. Distal tip of orogastric tube is seen in expected position of proximal stomach. IMPRESSION: No evidence of bowel obstruction or ileus. Distal tip of orogastric tube is seen in expected position of proximal stomach. Electronically Signed   By: Marijo Conception, M.D.   On: 03/30/2017 23:20   Ct Head Wo Contrast  Result Date: 03/31/2017 CLINICAL DATA:  Acute metabolic encephalopathy. Diabetic ketoacidosis. EXAM: CT HEAD WITHOUT CONTRAST TECHNIQUE: Contiguous axial images were obtained from the base of the skull through the vertex without intravenous contrast. COMPARISON:  CT head without contrast 03/30/2017. FINDINGS: Brain: A large right MCA territory nonhemorrhagic infarct is present. There is diffuse loss of gray-white differentiation the involving the right frontal, parietal, and much of the occipital lobe. There is some sparing of the  anteromedial right frontal lobe. There is progressive mass effect with 12 mm midline shift. There is partial effacement of the para mesencephalic cistern. The quadrigeminal plate cistern is intact. The fourth ventricle is normal. A posterior watershed territory infarct is noted on the left. No other acute or focal left-sided infarcts are present. Vascular: A hyperdense right MCA is evident the against the  backdrop of diffuse infarcted territory. No other hyperdense vessel is present. Skull: The calvarium is intact. No focal lytic or blastic lesions are present. Sinuses/Orbits: Nasopharyngeal fluid is likely related to intubation. There is mild mucosal thickening involving the maxillary left maxillary sinus and bilateral ethmoid sinuses. Minimal fluid is present in the sphenoid sinus. A remote medial orbital blowout fracture is noted. The visualized globes and orbits are otherwise unremarkable. IMPRESSION: 1. Progressive large right MCA territory infarct with involvement of the right occipital lobe likely related to a fetal type right posterior cerebral artery. 2. Diffuse edema and mass effect with 12 mm midline shift and partial effacement of the para mesencephalic cisterns. 3. Developing left posterior watershed territory infarct. 4. Nasal fluid and bilateral sinus disease likely related to intubation. These results were called by telephone at the time of interpretation on 03/31/2017 at 9:13 pm to Dr. Harrel Lemon , who verbally acknowledged these results. Electronically Signed   By: San Morelle M.D.   On: 03/31/2017 21:15   Ct Head Wo Contrast  Result Date: 03/30/2017 CLINICAL DATA:  56 y/o  M; unresponsive to stimuli. EXAM: CT HEAD WITHOUT CONTRAST TECHNIQUE: Contiguous axial images were obtained from the base of the skull through the vertex without intravenous contrast. COMPARISON:  None. FINDINGS: Brain: No evidence of acute infarction, hemorrhage, hydrocephalus, extra-axial collection or mass  lesion/mass effect. Vascular: No hyperdense vessel or unexpected calcification. Skull: Normal. Negative for fracture or focal lesion. Sinuses/Orbits: Underpneumatized frontal sinuses. Opacification rudimentary right frontal sinus. Chronic right lamina papyracea fracture. Normal aeration of mastoid air cells. Visualized orbits are normal. Other: None. IMPRESSION: No acute intracranial abnormality identified. Unremarkable CT of the brain. Electronically Signed   By: Kristine Garbe M.D.   On: 03/30/2017 21:33   Dg Chest Portable 1 View  Result Date: 03/30/2017 CLINICAL DATA:  Endotracheal tube placement EXAM: PORTABLE CHEST 1 VIEW COMPARISON:  None. FINDINGS: The heart size and mediastinal contours are within normal limits. The tip of an endotracheal tube is approximately 3.4 cm above the carina in satisfactory position. There also appears be gastric tube however the tip is not well visualized and occasional be followed to the level of the GE junction. External defibrillator paddles slightly limit assessment as well as EKG leads. Heart size is normal. No aortic aneurysm. No acute pneumonic consolidation. Both lungs are clear. The visualized skeletal structures are unremarkable. IMPRESSION: 1. Satisfactory endotracheal tube position at 3.4 cm above the carina. 2. There appears to be a gastric tube as well although the tip is not well visualized and can only be followed to the level of the GE junction. Electronically Signed   By: Ashley Royalty M.D.   On: 03/30/2017 19:33    Impression/Plan   56 y.o. male with large right MCA infarct, left watershed territory infarct, diffuse edema and mass effect with 65m midline shift. Neuro exam is limited due to intubation. He does follow commands with RUE. No movement with LUE. I have discussed the case with Dr DCyndy Freezewho has also reviewed the imaging. Pt will undergo right decompressive craniectomy tomorrow. Continue to monitor neuro exam. Should pupil no longer be  reactive, surgery may need to be sooner. Will continue to follow - continue hypertonic saline  - Started mannitol q 6 - frequent neuro checks per stroke protocol

## 2017-03-31 NOTE — Progress Notes (Signed)
Pt was transported to CT on transport vent and back to ICU.  Pt is in no apparent respiratory distress at this time.

## 2017-03-31 NOTE — Progress Notes (Signed)
Pharmacy Consult for Electrolyte Monitoring Indication: Hypokalemia  No Known Allergies  Patient Measurements: Height: 5\' 10"  (177.8 cm) Weight: 250 lb (113.4 kg) IBW/kg (Calculated) : 73  Vital Signs: Temp: 98.4 F (36.9 C) (08/24 1600) BP: 95/83 (08/24 1600) Pulse Rate: 70 (08/24 1600) Intake/Output from previous day: 08/23 0701 - 08/24 0700 In: 4697.3 [I.V.:1530.7; IV Piggyback:3166.7] Out: 3875 [Urine:3875] Intake/Output from this shift: Total I/O In: 435 [I.V.:435] Out: 930 [Urine:930]  Labs:  Recent Labs  03/30/17 1808  03/31/17 0151 03/31/17 0546 03/31/17 0945 03/31/17 1114 03/31/17 1304  WBC 24.0*  --  27.1*  --   --   --   --   HGB 14.6  --  12.6*  --   --   --   --   HCT 49.0  --  37.4*  --   --   --   --   PLT 159  --  134*  --   --   --   --   APTT  --   --   --   --   --  33  --   CREATININE 2.54*  < > 1.42* 1.21 1.05  --  0.86  MG  --   --  1.8  --   --   --   --   PHOS  --   --  1.6*  --   --   --   --   ALBUMIN 3.3*  --   --   --   --   --   --   PROT 7.6  --   --   --   --   --   --   AST 23  --   --   --   --   --   --   ALT 14*  --   --   --   --   --   --   ALKPHOS 94  --   --   --   --   --   --   BILITOT 2.3*  --   --   --   --   --   --   < > = values in this interval not displayed. Estimated Creatinine Clearance: 122.4 mL/min (by C-G formula based on SCr of 0.86 mg/dL).  Sodium (mmol/L)  Date Value  03/31/2017 138   Potassium (mmol/L)  Date Value  03/31/2017 3.2 (L)   Calcium (mg/dL)  Date Value  03/31/2017 7.9 (L)   Assessment: 56 y/o M admitted with DKA and r/o meningitis.   Plan:  Will give KCl 40 meq and magnesium sulfate 1 g iv once. Will rechedck magnesium at 1800.   Ulice Dash D 03/31/2017,4:38 PM

## 2017-03-31 NOTE — Progress Notes (Signed)
[facility] [place] NAME: Joe Wilkinson    MR#:  [id number]  DATE OF BIRTH:  29-Nov-1960  SUBJECTIVE:  Found unresponsive en-route travel to [place] Intubated and on the ventilator Was on IV insulin gtt--off -on IV phenylephrine gtt, IV fentanyl    REVIEW OF SYSTEMS:   Review of Systems  Unable to perform ROS: Intubated   Tolerating Diet: Tolerating PT:   DRUG ALLERGIES:  No Known Allergies  VITALS:  Blood pressure 95/83, pulse 70, temperature 98.4 F (36.9 C), resp. rate 18, height 5\' 10"  (1.778 m), weight 113.4 kg (250 lb), SpO2 99 %.  PHYSICAL EXAMINATION:   Physical Exam  GENERAL:  56 y.o.-year-old patient lying in the bed with no acute distress.  EYES: Pupils equal, round, reactive to light and accommodation. No scleral icterus. Extraocular muscles intact.  HEENT: Head atraumatic, normocephalic. Oropharynx and nasopharynx clear. Intubated and on the vent NECK:  Supple, no jugular venous distention. No thyroid enlargement, no tenderness.  LUNGS: Normal breath sounds bilaterally, no wheezing, rales, rhonchi. No use of accessory muscles of respiration.  CARDIOVASCULAR: S1, S2 normal. No murmurs, rubs, or gallops.  ABDOMEN: Soft, nontender, nondistended. Bowel sounds present. No organomegaly or mass.  EXTREMITIES: No cyanosis, clubbing or edema b/l.    NEUROLOGIC: intubated  PSYCHIATRIC:  patient is on the vent  SKIN: No obvious rash, lesion, or ulcer.   LABORATORY PANEL:  CBC  Recent Labs Lab 03/31/17 0151  WBC 27.1*  HGB 12.6*  HCT 37.4*  PLT 134*    Chemistries   Recent Labs Lab 03/30/17 1808  03/31/17 0151  03/31/17 1304  NA 115*  < > 137  < > 138  K 6.2*  < > 4.0  < > 3.2*  CL 83*  < > 108  < > 110  CO2 <7*  < > 19*  < > 24  GLUCOSE 1,132*  < > 495*  < > 211*  BUN 52*  < > 39*  < > 25*  CREATININE 2.54*  < > 1.42*  < > 0.86  CALCIUM 9.4  < > 7.7*  < > 7.9*  MG  --   --  1.8  --   --   AST  23  --   --   --   --   ALT 14*  --   --   --   --   ALKPHOS 94  --   --   --   --   BILITOT 2.3*  --   --   --   --   < > = values in this interval not displayed. Cardiac Enzymes  Recent Labs Lab 03/30/17 1808  TROPONINI <0.03   RADIOLOGY:  Dg Chest 1 View  Result Date: 03/30/2017 CLINICAL DATA:  Central line placement. EXAM: CHEST 1 VIEW COMPARISON:  Radiograph same day. FINDINGS: The heart size and mediastinal contours are within normal limits. Both lungs are clear. No pneumothorax or pleural effusion is noted. Endotracheal and nasogastric tubes are unchanged in position. Interval placement of left internal jugular catheter, with distal tip in expected position of SVC. The visualized skeletal structures are unremarkable. IMPRESSION: Stable support apparatus. Interval placement of left internal jugular catheter, with distal tip in expected position of SVC. No acute cardiopulmonary abnormality seen. Electronically Signed   By: Marijo Conception, M.D.   On: 03/30/2017 23:19   Dg Abd 1 View  Result Date: 03/30/2017 CLINICAL DATA:  Orogastric  tube placement. EXAM: ABDOMEN - 1 VIEW COMPARISON:  None. FINDINGS: The bowel gas pattern is normal. No radio-opaque calculi or other significant radiographic abnormality are seen. Distal tip of orogastric tube is seen in expected position of proximal stomach. IMPRESSION: No evidence of bowel obstruction or ileus. Distal tip of orogastric tube is seen in expected position of proximal stomach. Electronically Signed   By: Marijo Conception, M.D.   On: 03/30/2017 23:20   Ct Head Wo Contrast  Result Date: 03/30/2017 CLINICAL DATA:  56 y/o  M; unresponsive to stimuli. EXAM: CT HEAD WITHOUT CONTRAST TECHNIQUE: Contiguous axial images were obtained from the base of the skull through the vertex without intravenous contrast. COMPARISON:  None. FINDINGS: Brain: No evidence of acute infarction, hemorrhage, hydrocephalus, extra-axial collection or mass lesion/mass effect.  Vascular: No hyperdense vessel or unexpected calcification. Skull: Normal. Negative for fracture or focal lesion. Sinuses/Orbits: Underpneumatized frontal sinuses. Opacification rudimentary right frontal sinus. Chronic right lamina papyracea fracture. Normal aeration of mastoid air cells. Visualized orbits are normal. Other: None. IMPRESSION: No acute intracranial abnormality identified. Unremarkable CT of the brain. Electronically Signed   By: Kristine Garbe M.D.   On: 03/30/2017 21:33   Dg Chest Portable 1 View  Result Date: 03/30/2017 CLINICAL DATA:  Endotracheal tube placement EXAM: PORTABLE CHEST 1 VIEW COMPARISON:  None. FINDINGS: The heart size and mediastinal contours are within normal limits. The tip of an endotracheal tube is approximately 3.4 cm above the carina in satisfactory position. There also appears be gastric tube however the tip is not well visualized and occasional be followed to the level of the GE junction. External defibrillator paddles slightly limit assessment as well as EKG leads. Heart size is normal. No aortic aneurysm. No acute pneumonic consolidation. Both lungs are clear. The visualized skeletal structures are unremarkable. IMPRESSION: 1. Satisfactory endotracheal tube position at 3.4 cm above the carina. 2. There appears to be a gastric tube as well although the tip is not well visualized and can only be followed to the level of the GE junction. Electronically Signed   By: Ashley Royalty M.D.   On: 03/30/2017 19:33   ASSESSMENT AND PLAN:  56 year old male with an unknown history. [place] and then became unresponsive. He's found to be in DKA with severe acidosis.  1. Acute Metabolic Encephalopathy -pt found to be in severe Diabetic ketoacidosis. Patient's blood sugar over 1100 and is very acidotic.  -Received IVF, IV Insulin gtt off -Acidosis corrected -underwent LP to r/o infectious etiology -seen by Dr Doy Mince -empirically on IV  rocephin,ampicillin and vancomycin  2. Acute respiratory failure. Suspect he fatigued because of trying to compensate for his severe acidosis. -intubated and on the vent  -Chest x-ray does not show any sign of infection or pulmonary edema.  3. Acute renal failure. Suspect is from dehydration from the high blood sugars. Or he could have a history of chronic renal failure but is unknown at this time. We'll continue fluid resuscitation and see how his kidneys respond.   4. Hypovolemic shock. Likely secondary to volume depletion.  -now improved  5. Dm-2--new onset -cont SSI and insulin regimen  Case discussed with Care Management/Social Worker. CODE STATUS: full  DVT Prophylaxis: lovenox  TOTAL TIME TAKING CARE OF THIS PATIENT: 30 minutes.  >50% time spent on counselling and coordination of care   D/C  DEPENDING ON CLINICAL CONDITION.  Note: This dictation was prepared with Dragon dictation along with smaller phrase  technology. Any transcriptional errors that result from this process are unintentional.  Cody Oliger M.D on 03/31/2017 at 5:02 PM  Between 7am to 6pm - Pager - [phone]  After 6pm go to [email or web address] - password [email or web address] [facility]  Office  [phone]  CC: Primary care physician; Patient, No Pcp Per

## 2017-03-31 NOTE — Progress Notes (Signed)
Pt awake and alert.  Pt not moving left side of body including pain.  He eyes are deviated to the right.  He has left-sided neglect.  He follows commands on the right side.  [place], NP notified and at bedside for assessment.  Marguarite Arbour, RN

## 2017-03-31 NOTE — Progress Notes (Signed)
Inpatient Diabetes Program Recommendations  AACE/ADA: New Consensus Statement on Inpatient Glycemic Control (2015)  Target Ranges:  Prepandial:   less than 140 mg/dL      Peak postprandial:   less than 180 mg/dL (1-2 hours)      Critically ill patients:  140 - 180 mg/dL   Results for ANNA, LIVERS (MRN [id number]) as of 03/31/2017 09:12  Ref. Range 03/30/2017 18:08  Sodium Latest Ref Range: 135 - 145 mmol/L 115 (LL)  Potassium Latest Ref Range: 3.5 - 5.1 mmol/L 6.2 (H)  Chloride Latest Ref Range: 101 - 111 mmol/L 83 (L)  CO2 Latest Ref Range: 22 - 32 mmol/L <7 (L)  Glucose Latest Ref Range: 65 - 99 mg/dL 1,132 (HH)  BUN Latest Ref Range: 6 - 20 mg/dL 52 (H)  Creatinine Latest Ref Range: 0.61 - 1.24 mg/dL 2.54 (H)  Calcium Latest Ref Range: 8.9 - 10.3 mg/dL 9.4  Anion gap Latest Ref Range: 5 - 15  NOT CALCULATED    Admit with: Severe DKA  History: Unknown  Home DM Meds: Unknown  Current Insulin Orders: IV Insulin drip      -Unknown Past Medical History.  Patient unable to provide health history at this time (Intubated/ Sedated).  -BMET from 5:45am today shows acidosis has improved: Anion Gap down to 4 and CO2 level up to 24.  -Likely best to wait to transition to SQ Insulin until CBGs have also stabilized.  -When patient ready to transition to SQ Insulin, please make sure pt receives basal insulin (Lantus or Levemir) at least 1-2 hours prior to d/c of IV insulin drip.      --Will follow patient during hospitalization--  Wyn Quaker RN, MSN, CDE Diabetes Coordinator Inpatient Glycemic Control Team Team Pager: [phone] (8a-5p)

## 2017-03-31 NOTE — Progress Notes (Addendum)
Phone number [phone] found in records. Number called. A woman answered claiming to be a friend of patient. Woman was confused on why the patient would be in [place]. The friend shared a phone number of the patient's aunt who lives in [place]. The friend did not provide her name and did not know the Aunt's name. The Aunt was called. No answer. A voicemail was left to call the hospital. Aunts number is [phone]. Both numbers added to chart.

## 2017-03-31 NOTE — Progress Notes (Signed)
Pharmacy Antibiotic Note  Joe Wilkinson is a 56 y.o. male admitted on 03/30/2017 with DKA and r/o meningitis.  Pharmacy has been consulted for vancomycin dosing. Patient is also ordered ceftriaxone and ampicillin.  Plan: Vancomycin 1000 mg iv once then vancomycin 1250 IV every 12 hours with stacked dosing and a trough with the 4th dose.  Goal trough 15-20 mcg/mL.  Height: 5\' 10"  (177.8 cm) Weight: 250 lb (113.4 kg) IBW/kg (Calculated) : 73  Temp (24hrs), Avg:98.1 F (36.7 C), Min:92.8 F (33.8 C), Max:100.9 F (38.3 C)   Recent Labs Lab 03/30/17 1808 03/30/17 2248 03/31/17 0151 03/31/17 0546 03/31/17 0945 03/31/17 1304  WBC 24.0*  --  27.1*  --   --   --   CREATININE 2.54* 1.68* 1.42* 1.21 1.05 0.86    Estimated Creatinine Clearance: 122.4 mL/min (by C-G formula based on SCr of 0.86 mg/dL).    No Known Allergies   Thank you for allowing pharmacy to be a part of this patient's care.  Ulice Dash D 03/31/2017 4:25 PM

## 2017-03-31 NOTE — Progress Notes (Signed)
Spoke with Kelby Fam the patient's aunt. She has been updated.

## 2017-03-31 NOTE — Progress Notes (Signed)
Pharmacy Consult for Electrolyte Monitoring Indication: Hypokalemia  No Known Allergies  Patient Measurements: Height: 5\' 10"  (177.8 cm) Weight: 250 lb (113.4 kg) IBW/kg (Calculated) : 73  Vital Signs: Temp: 98.6 F (37 C) (08/24 1800) BP: 114/40 (08/24 1800) Pulse Rate: 66 (08/24 1800) Intake/Output from previous day: 08/23 0701 - 08/24 0700 In: 4697.3 [I.V.:1530.7; IV Piggyback:3166.7] Out: 3875 [Urine:3875] Intake/Output from this shift: No intake/output data recorded.  Labs:  Recent Labs  03/30/17 1808  03/31/17 0151 03/31/17 0546 03/31/17 0945 03/31/17 1114 03/31/17 1304  WBC 24.0*  --  27.1*  --   --   --   --   HGB 14.6  --  12.6*  --   --   --   --   HCT 49.0  --  37.4*  --   --   --   --   PLT 159  --  134*  --   --   --   --   APTT  --   --   --   --   --  33  --   CREATININE 2.54*  < > 1.42* 1.21 1.05  --  0.86  MG  --   --  1.8  --   --   --   --   PHOS  --   --  1.6*  --   --   --   --   ALBUMIN 3.3*  --   --   --   --   --   --   PROT 7.6  --   --   --   --   --   --   AST 23  --   --   --   --   --   --   ALT 14*  --   --   --   --   --   --   ALKPHOS 94  --   --   --   --   --   --   BILITOT 2.3*  --   --   --   --   --   --   < > = values in this interval not displayed. Estimated Creatinine Clearance: 122.4 mL/min (by C-G formula based on SCr of 0.86 mg/dL).  Sodium (mmol/L)  Date Value  03/31/2017 138   Potassium (mmol/L)  Date Value  03/31/2017 4.2   Calcium (mg/dL)  Date Value  03/31/2017 7.9 (L)   Assessment: 56 y/o M admitted with DKA and r/o meningitis.   Plan:  K = 4.2 after supplementation. Insulin drip was stopped this afternoon. No replacement needed at this time.  Will recheck electrolytes with AM labs tomorrow.  Lenis Noon, PharmD Clinical Pharmacist 03/31/2017,7:53 PM

## 2017-03-31 NOTE — Progress Notes (Signed)
CH responded to code stoke. CH offered silent prayer as no one else was in the room. Staff stated that no family was present and that they would contact them. Staff stated that he had no family here.    03/31/17 2125  Clinical Encounter Type  Visited With Patient not available;Health care provider  Visit Type Initial;Spiritual support;Code  Referral From Nurse

## 2017-04-01 ENCOUNTER — Inpatient Hospital Stay (HOSPITAL_COMMUNITY): Payer: Medicaid Other

## 2017-04-01 ENCOUNTER — Encounter (HOSPITAL_COMMUNITY)
Admission: RE | Disposition: A | Discharge status: Skilled Nursing Facility | Payer: Self-pay | Source: Other Acute Inpatient Hospital | Attending: Internal Medicine

## 2017-04-01 ENCOUNTER — Inpatient Hospital Stay (HOSPITAL_COMMUNITY): Payer: Medicaid Other | Admitting: Anesthesiology

## 2017-04-01 DIAGNOSIS — R414 Neurologic neglect syndrome: Secondary | ICD-10-CM | POA: Diagnosis present

## 2017-04-01 DIAGNOSIS — Z930 Tracheostomy status: Secondary | ICD-10-CM | POA: Diagnosis not present

## 2017-04-01 DIAGNOSIS — G936 Cerebral edema: Secondary | ICD-10-CM

## 2017-04-01 DIAGNOSIS — J9600 Acute respiratory failure, unspecified whether with hypoxia or hypercapnia: Secondary | ICD-10-CM | POA: Diagnosis not present

## 2017-04-01 DIAGNOSIS — G8194 Hemiplegia, unspecified affecting left nondominant side: Secondary | ICD-10-CM | POA: Diagnosis present

## 2017-04-01 DIAGNOSIS — Z781 Physical restraint status: Secondary | ICD-10-CM | POA: Diagnosis not present

## 2017-04-01 DIAGNOSIS — M542 Cervicalgia: Secondary | ICD-10-CM | POA: Diagnosis not present

## 2017-04-01 DIAGNOSIS — D62 Acute posthemorrhagic anemia: Secondary | ICD-10-CM | POA: Diagnosis not present

## 2017-04-01 DIAGNOSIS — I63511 Cerebral infarction due to unspecified occlusion or stenosis of right middle cerebral artery: Secondary | ICD-10-CM | POA: Diagnosis present

## 2017-04-01 DIAGNOSIS — G935 Compression of brain: Secondary | ICD-10-CM

## 2017-04-01 DIAGNOSIS — E118 Type 2 diabetes mellitus with unspecified complications: Secondary | ICD-10-CM

## 2017-04-01 DIAGNOSIS — Z0189 Encounter for other specified special examinations: Secondary | ICD-10-CM | POA: Diagnosis not present

## 2017-04-01 DIAGNOSIS — Z430 Encounter for attention to tracheostomy: Secondary | ICD-10-CM | POA: Diagnosis not present

## 2017-04-01 DIAGNOSIS — E1159 Type 2 diabetes mellitus with other circulatory complications: Secondary | ICD-10-CM | POA: Diagnosis not present

## 2017-04-01 DIAGNOSIS — E875 Hyperkalemia: Secondary | ICD-10-CM | POA: Diagnosis not present

## 2017-04-01 DIAGNOSIS — Z9289 Personal history of other medical treatment: Secondary | ICD-10-CM | POA: Diagnosis not present

## 2017-04-01 DIAGNOSIS — R6521 Severe sepsis with septic shock: Secondary | ICD-10-CM | POA: Diagnosis present

## 2017-04-01 DIAGNOSIS — J9690 Respiratory failure, unspecified, unspecified whether with hypoxia or hypercapnia: Secondary | ICD-10-CM | POA: Diagnosis not present

## 2017-04-01 DIAGNOSIS — K610 Anal abscess: Secondary | ICD-10-CM | POA: Diagnosis present

## 2017-04-01 DIAGNOSIS — R0902 Hypoxemia: Secondary | ICD-10-CM | POA: Diagnosis not present

## 2017-04-01 DIAGNOSIS — R05 Cough: Secondary | ICD-10-CM | POA: Diagnosis not present

## 2017-04-01 DIAGNOSIS — T17908D Unspecified foreign body in respiratory tract, part unspecified causing other injury, subsequent encounter: Secondary | ICD-10-CM | POA: Diagnosis not present

## 2017-04-01 DIAGNOSIS — J9622 Acute and chronic respiratory failure with hypercapnia: Secondary | ICD-10-CM | POA: Diagnosis not present

## 2017-04-01 DIAGNOSIS — J810 Acute pulmonary edema: Secondary | ICD-10-CM | POA: Diagnosis not present

## 2017-04-01 DIAGNOSIS — J9601 Acute respiratory failure with hypoxia: Secondary | ICD-10-CM | POA: Diagnosis not present

## 2017-04-01 DIAGNOSIS — D649 Anemia, unspecified: Secondary | ICD-10-CM | POA: Diagnosis not present

## 2017-04-01 DIAGNOSIS — J9602 Acute respiratory failure with hypercapnia: Secondary | ICD-10-CM | POA: Diagnosis not present

## 2017-04-01 DIAGNOSIS — I480 Paroxysmal atrial fibrillation: Secondary | ICD-10-CM | POA: Diagnosis not present

## 2017-04-01 DIAGNOSIS — Z978 Presence of other specified devices: Secondary | ICD-10-CM | POA: Diagnosis not present

## 2017-04-01 DIAGNOSIS — Y92230 Patient room in hospital as the place of occurrence of the external cause: Secondary | ICD-10-CM | POA: Diagnosis not present

## 2017-04-01 DIAGNOSIS — R739 Hyperglycemia, unspecified: Secondary | ICD-10-CM | POA: Diagnosis not present

## 2017-04-01 DIAGNOSIS — R109 Unspecified abdominal pain: Secondary | ICD-10-CM | POA: Diagnosis not present

## 2017-04-01 DIAGNOSIS — I639 Cerebral infarction, unspecified: Secondary | ICD-10-CM

## 2017-04-01 DIAGNOSIS — J988 Other specified respiratory disorders: Secondary | ICD-10-CM | POA: Diagnosis not present

## 2017-04-01 DIAGNOSIS — Z789 Other specified health status: Secondary | ICD-10-CM | POA: Diagnosis not present

## 2017-04-01 DIAGNOSIS — I10 Essential (primary) hypertension: Secondary | ICD-10-CM | POA: Diagnosis not present

## 2017-04-01 DIAGNOSIS — Z9889 Other specified postprocedural states: Secondary | ICD-10-CM | POA: Diagnosis not present

## 2017-04-01 DIAGNOSIS — Z9911 Dependence on respirator [ventilator] status: Secondary | ICD-10-CM

## 2017-04-01 DIAGNOSIS — I63311 Cerebral infarction due to thrombosis of right middle cerebral artery: Secondary | ICD-10-CM | POA: Diagnosis not present

## 2017-04-01 DIAGNOSIS — Z452 Encounter for adjustment and management of vascular access device: Secondary | ICD-10-CM | POA: Diagnosis not present

## 2017-04-01 DIAGNOSIS — J9811 Atelectasis: Secondary | ICD-10-CM | POA: Diagnosis present

## 2017-04-01 DIAGNOSIS — I63411 Cerebral infarction due to embolism of right middle cerebral artery: Secondary | ICD-10-CM | POA: Diagnosis present

## 2017-04-01 DIAGNOSIS — E870 Hyperosmolality and hypernatremia: Secondary | ICD-10-CM | POA: Diagnosis present

## 2017-04-01 DIAGNOSIS — I63532 Cerebral infarction due to unspecified occlusion or stenosis of left posterior cerebral artery: Secondary | ICD-10-CM | POA: Diagnosis present

## 2017-04-01 DIAGNOSIS — Q321 Other congenital malformations of trachea: Secondary | ICD-10-CM | POA: Diagnosis not present

## 2017-04-01 DIAGNOSIS — T17908S Unspecified foreign body in respiratory tract, part unspecified causing other injury, sequela: Secondary | ICD-10-CM | POA: Diagnosis not present

## 2017-04-01 DIAGNOSIS — J9620 Acute and chronic respiratory failure, unspecified whether with hypoxia or hypercapnia: Secondary | ICD-10-CM | POA: Diagnosis not present

## 2017-04-01 DIAGNOSIS — R140 Abdominal distension (gaseous): Secondary | ICD-10-CM | POA: Diagnosis not present

## 2017-04-01 DIAGNOSIS — I6789 Other cerebrovascular disease: Secondary | ICD-10-CM | POA: Diagnosis not present

## 2017-04-01 DIAGNOSIS — A401 Sepsis due to streptococcus, group B: Secondary | ICD-10-CM | POA: Diagnosis present

## 2017-04-01 DIAGNOSIS — N390 Urinary tract infection, site not specified: Secondary | ICD-10-CM | POA: Diagnosis present

## 2017-04-01 DIAGNOSIS — Z515 Encounter for palliative care: Secondary | ICD-10-CM | POA: Diagnosis not present

## 2017-04-01 DIAGNOSIS — R51 Headache: Secondary | ICD-10-CM | POA: Diagnosis not present

## 2017-04-01 DIAGNOSIS — J90 Pleural effusion, not elsewhere classified: Secondary | ICD-10-CM | POA: Diagnosis not present

## 2017-04-01 DIAGNOSIS — J189 Pneumonia, unspecified organism: Secondary | ICD-10-CM | POA: Diagnosis present

## 2017-04-01 DIAGNOSIS — E1111 Type 2 diabetes mellitus with ketoacidosis with coma: Secondary | ICD-10-CM | POA: Diagnosis not present

## 2017-04-01 DIAGNOSIS — Z431 Encounter for attention to gastrostomy: Secondary | ICD-10-CM | POA: Diagnosis not present

## 2017-04-01 DIAGNOSIS — N179 Acute kidney failure, unspecified: Secondary | ICD-10-CM | POA: Diagnosis present

## 2017-04-01 DIAGNOSIS — D72829 Elevated white blood cell count, unspecified: Secondary | ICD-10-CM | POA: Diagnosis not present

## 2017-04-01 DIAGNOSIS — D6959 Other secondary thrombocytopenia: Secondary | ICD-10-CM | POA: Diagnosis present

## 2017-04-01 DIAGNOSIS — R579 Shock, unspecified: Secondary | ICD-10-CM | POA: Diagnosis not present

## 2017-04-01 DIAGNOSIS — G44319 Acute post-traumatic headache, not intractable: Secondary | ICD-10-CM | POA: Diagnosis not present

## 2017-04-01 DIAGNOSIS — I63413 Cerebral infarction due to embolism of bilateral middle cerebral arteries: Secondary | ICD-10-CM | POA: Diagnosis not present

## 2017-04-01 DIAGNOSIS — A419 Sepsis, unspecified organism: Secondary | ICD-10-CM | POA: Diagnosis present

## 2017-04-01 DIAGNOSIS — Z794 Long term (current) use of insulin: Secondary | ICD-10-CM | POA: Diagnosis not present

## 2017-04-01 DIAGNOSIS — R1084 Generalized abdominal pain: Secondary | ICD-10-CM | POA: Diagnosis not present

## 2017-04-01 DIAGNOSIS — J9621 Acute and chronic respiratory failure with hypoxia: Secondary | ICD-10-CM | POA: Diagnosis not present

## 2017-04-01 HISTORY — PX: CRANIECTOMY: SHX331

## 2017-04-01 LAB — BLOOD GAS, ARTERIAL
Acid-base deficit: 2 mmol/L (ref 0.0–2.0)
BICARBONATE: 21.2 mmol/L (ref 20.0–28.0)
Drawn by: 437071
FIO2: 40
LHR: 18 {breaths}/min
O2 Saturation: 99.3 %
PEEP/CPAP: 5 cmH2O
Patient temperature: 97
VT: 550 mL
pCO2 arterial: 28.3 mmHg — ABNORMAL LOW (ref 32.0–48.0)
pH, Arterial: 7.483 — ABNORMAL HIGH (ref 7.350–7.450)
pO2, Arterial: 136 mmHg — ABNORMAL HIGH (ref 83.0–108.0)

## 2017-04-01 LAB — GLUCOSE, CAPILLARY
GLUCOSE-CAPILLARY: 115 mg/dL — AB (ref 65–99)
GLUCOSE-CAPILLARY: 95 mg/dL (ref 65–99)
GLUCOSE-CAPILLARY: 250 mg/dL — AB (ref 65–99)
Glucose-Capillary: 105 mg/dL — ABNORMAL HIGH (ref 65–99)
Glucose-Capillary: 139 mg/dL — ABNORMAL HIGH (ref 65–99)
Glucose-Capillary: 140 mg/dL — ABNORMAL HIGH (ref 65–99)
Glucose-Capillary: 152 mg/dL — ABNORMAL HIGH (ref 65–99)
Glucose-Capillary: 246 mg/dL — ABNORMAL HIGH (ref 65–99)
Glucose-Capillary: 323 mg/dL — ABNORMAL HIGH (ref 65–99)
Glucose-Capillary: 98 mg/dL (ref 65–99)

## 2017-04-01 LAB — COMPREHENSIVE METABOLIC PANEL
ALBUMIN: 2.2 g/dL — AB (ref 3.5–5.0)
ALK PHOS: 58 U/L (ref 38–126)
ALT: 13 U/L — AB (ref 17–63)
AST: 40 U/L (ref 15–41)
Anion gap: 5 (ref 5–15)
BUN: 14 mg/dL (ref 6–20)
CALCIUM: 8.2 mg/dL — AB (ref 8.9–10.3)
CO2: 24 mmol/L (ref 22–32)
CREATININE: 0.9 mg/dL (ref 0.61–1.24)
Chloride: 114 mmol/L — ABNORMAL HIGH (ref 101–111)
GFR calc Af Amer: >60 mL/min (ref >60)
GFR calc non Af Amer: >60 mL/min (ref >60)
GLUCOSE: 100 mg/dL — AB (ref 65–99)
Potassium: 3.5 mmol/L (ref 3.5–5.1)
SODIUM: 143 mmol/L (ref 135–145)
Total Bilirubin: 1.7 mg/dL — ABNORMAL HIGH (ref 0.3–1.2)
Total Protein: 5.3 g/dL — ABNORMAL LOW (ref 6.5–8.1)

## 2017-04-01 LAB — BASIC METABOLIC PANEL
ANION GAP: 8 (ref 5–15)
BUN: 10 mg/dL (ref 6–20)
CALCIUM: 7.4 mg/dL — AB (ref 8.9–10.3)
CO2: 20 mmol/L — AB (ref 22–32)
Chloride: 117 mmol/L — ABNORMAL HIGH (ref 101–111)
Creatinine, Ser: 0.93 mg/dL (ref 0.61–1.24)
GFR calc Af Amer: >60 mL/min (ref >60)
GFR calc non Af Amer: >60 mL/min (ref >60)
GLUCOSE: 303 mg/dL — AB (ref 65–99)
Potassium: 4.4 mmol/L (ref 3.5–5.1)
Sodium: 145 mmol/L (ref 135–145)

## 2017-04-01 LAB — MAGNESIUM
MAGNESIUM: 2 mg/dL (ref 1.7–2.4)
Magnesium: 2 mg/dL (ref 1.7–2.4)
Magnesium: 1.7 mg/dL (ref 1.7–2.4)

## 2017-04-01 LAB — CBC
HEMATOCRIT: 26.5 % — AB (ref 39.0–52.0)
Hemoglobin: 8.7 g/dL — ABNORMAL LOW (ref 13.0–17.0)
MCH: 26 pg (ref 26.0–34.0)
MCHC: 32.8 g/dL (ref 30.0–36.0)
MCV: 79.1 fL (ref 78.0–100.0)
Platelets: 111 10*3/uL — ABNORMAL LOW (ref 150–400)
RBC: 3.35 MIL/uL — ABNORMAL LOW (ref 4.22–5.81)
RDW: 16.2 % — AB (ref 11.5–15.5)
WBC: 15.1 10*3/uL — AB (ref 4.0–10.5)

## 2017-04-01 LAB — CBC WITH DIFFERENTIAL/PLATELET
BASOS ABS: 0.1 10*3/uL (ref 0.0–0.1)
Basophils Relative: 0 %
Eosinophils Absolute: 0 10*3/uL (ref 0.0–0.7)
Eosinophils Relative: 0 %
HEMATOCRIT: 32.9 % — AB (ref 39.0–52.0)
Hemoglobin: 10.9 g/dL — ABNORMAL LOW (ref 13.0–17.0)
LYMPHS PCT: 14 %
Lymphs Abs: 3.1 10*3/uL (ref 0.7–4.0)
MCH: 25.7 pg — ABNORMAL LOW (ref 26.0–34.0)
MCHC: 33.1 g/dL (ref 30.0–36.0)
MCV: 77.6 fL — AB (ref 78.0–100.0)
MONO ABS: 4 10*3/uL — AB (ref 0.1–1.0)
MONOS PCT: 18 %
NEUTROS ABS: 15.1 10*3/uL — AB (ref 1.7–7.7)
Neutrophils Relative %: 68 %
Platelets: 123 10*3/uL — ABNORMAL LOW (ref 150–400)
RBC: 4.24 MIL/uL (ref 4.22–5.81)
RDW: 15.4 % (ref 11.5–15.5)
WBC: 22.2 10*3/uL — ABNORMAL HIGH (ref 4.0–10.5)

## 2017-04-01 LAB — ABO/RH: ABO/RH(D): O NEG

## 2017-04-01 LAB — SURGICAL PCR SCREEN
MRSA, PCR: NEGATIVE
STAPHYLOCOCCUS AUREUS: NEGATIVE

## 2017-04-01 LAB — SODIUM: Sodium: 149 mmol/L — ABNORMAL HIGH (ref 135–145)

## 2017-04-01 LAB — PHOSPHORUS
Phosphorus: <1 mg/dL — CL (ref 2.5–4.6)
Phosphorus: 3 mg/dL (ref 2.5–4.6)
Phosphorus: 1.6 mg/dL — ABNORMAL LOW (ref 2.5–4.6)

## 2017-04-01 LAB — APTT: aPTT: 31 seconds (ref 24–36)

## 2017-04-01 LAB — HIV ANTIBODY (ROUTINE TESTING W REFLEX): HIV Screen 4th Generation wRfx: NONREACTIVE

## 2017-04-01 LAB — PROCALCITONIN: PROCALCITONIN: 2.53 ng/mL

## 2017-04-01 LAB — PREPARE RBC (CROSSMATCH)

## 2017-04-01 SURGERY — CRANIECTOMY POSTERIOR FOSSA DECOMPRESSION
Anesthesia: General | Site: Head | Laterality: Right

## 2017-04-01 MED ORDER — ROCURONIUM BROMIDE 100 MG/10ML IV SOLN
INTRAVENOUS | Status: DC | PRN
  Administered 2017-04-01: 50 mg via INTRAVENOUS

## 2017-04-01 MED ORDER — POTASSIUM PHOSPHATES 15 MMOLE/5ML IV SOLN
30.0000 mmol | Freq: Once | INTRAVENOUS | Status: AC
  Administered 2017-04-01: 30 mmol via INTRAVENOUS
  Filled 2017-04-01: qty 10

## 2017-04-01 MED ORDER — DEXAMETHASONE SODIUM PHOSPHATE 10 MG/ML IJ SOLN
INTRAMUSCULAR | Status: AC
Start: 2017-04-01 — End: 2017-04-01
  Filled 2017-04-01: qty 1

## 2017-04-01 MED ORDER — PRO-STAT SUGAR FREE PO LIQD: 30.0000 mL | Freq: Two times a day (BID) | ORAL | Status: DC

## 2017-04-01 MED ORDER — MICROFIBRILLAR COLL HEMOSTAT EX PADS
MEDICATED_PAD | CUTANEOUS | Status: DC | PRN
  Administered 2017-04-01: 1 via TOPICAL

## 2017-04-01 MED ORDER — SODIUM CHLORIDE 3 % IV SOLN
INTRAVENOUS | Status: DC
  Administered 2017-03-31 – 2017-04-02 (×7): 50 mL/h via INTRAVENOUS
  Administered 2017-04-07 – 2017-04-08 (×3): 30 mL/h via INTRAVENOUS
  Administered 2017-04-09 – 2017-04-10 (×2): 40 mL/h via INTRAVENOUS
  Filled 2017-04-01 (×32): qty 500

## 2017-04-01 MED ORDER — PANTOPRAZOLE SODIUM 40 MG IV SOLR: 40.0000 mg | Freq: Every day | INTRAVENOUS | Status: DC

## 2017-04-01 MED ORDER — PIPERACILLIN-TAZOBACTAM 3.375 G IVPB
3.3750 g | Freq: Three times a day (TID) | INTRAVENOUS | Status: DC
  Administered 2017-04-01 – 2017-04-03 (×7): 3.375 g via INTRAVENOUS
  Filled 2017-04-01 (×8): qty 50

## 2017-04-01 MED ORDER — THROMBIN 20000 UNITS EX SOLR
CUTANEOUS | Status: AC
  Filled 2017-04-01: qty 20000

## 2017-04-01 MED ORDER — FENTANYL CITRATE (PF) 250 MCG/5ML IJ SOLN
INTRAMUSCULAR | Status: AC
  Filled 2017-04-01: qty 5

## 2017-04-01 MED ORDER — THROMBIN 5000 UNITS EX SOLR
CUTANEOUS | Status: DC | PRN
  Administered 2017-04-01: 5000 [IU] via TOPICAL

## 2017-04-01 MED ORDER — ONDANSETRON HCL 4 MG/2ML IJ SOLN
INTRAMUSCULAR | Status: AC
  Filled 2017-04-01: qty 2

## 2017-04-01 MED ORDER — PANTOPRAZOLE SODIUM 40 MG PO PACK
40.0000 mg | PACK | Freq: Every day | ORAL | Status: DC
  Administered 2017-04-01 – 2017-04-06 (×6): 40 mg
  Filled 2017-04-01 (×5): qty 20

## 2017-04-01 MED ORDER — IPRATROPIUM-ALBUTEROL 0.5-2.5 (3) MG/3ML IN SOLN
3.0000 mL | Freq: Four times a day (QID) | RESPIRATORY_TRACT | Status: DC
  Administered 2017-04-01: 3 mL via RESPIRATORY_TRACT
  Filled 2017-04-01 (×2): qty 3

## 2017-04-01 MED ORDER — PANTOPRAZOLE SODIUM 40 MG PO PACK: 40.0000 mg | PACK | ORAL | Status: DC

## 2017-04-01 MED ORDER — FENTANYL CITRATE (PF) 100 MCG/2ML IJ SOLN
INTRAMUSCULAR | Status: DC | PRN
  Administered 2017-04-01: 100 ug via INTRAVENOUS
  Administered 2017-04-01 (×2): 250 ug via INTRAVENOUS
  Administered 2017-04-01: 150 ug via INTRAVENOUS

## 2017-04-01 MED ORDER — FENTANYL CITRATE (PF) 100 MCG/2ML IJ SOLN: 50.0000 ug | Freq: Once | INTRAMUSCULAR | Status: DC

## 2017-04-01 MED ORDER — SODIUM CHLORIDE 0.9 % IV SOLN
250.0000 mL | INTRAVENOUS | Status: DC | PRN
  Administered 2017-04-01: 08:00:00 via INTRAVENOUS

## 2017-04-01 MED ORDER — DOCUSATE SODIUM 50 MG/5ML PO LIQD
100.0000 mg | Freq: Two times a day (BID) | ORAL | Status: DC
  Administered 2017-04-01 – 2017-04-05 (×10): 100 mg
  Filled 2017-04-01 (×10): qty 10

## 2017-04-01 MED ORDER — ARTIFICIAL TEARS OPHTHALMIC OINT
TOPICAL_OINTMENT | OPHTHALMIC | Status: AC
  Filled 2017-04-01: qty 3.5

## 2017-04-01 MED ORDER — MIDAZOLAM HCL 5 MG/5ML IJ SOLN
INTRAMUSCULAR | Status: DC | PRN
  Administered 2017-04-01: 2 mg via INTRAVENOUS

## 2017-04-01 MED ORDER — CHLORHEXIDINE GLUCONATE 0.12% ORAL RINSE (MEDLINE KIT)
15.0000 mL | Freq: Two times a day (BID) | OROMUCOSAL | Status: DC
  Administered 2017-04-01 – 2017-04-07 (×12): 15 mL via OROMUCOSAL

## 2017-04-01 MED ORDER — SENNA 8.6 MG PO TABS
1.0000 | ORAL_TABLET | Freq: Two times a day (BID) | ORAL | Status: DC
  Filled 2017-04-01: qty 1

## 2017-04-01 MED ORDER — BISACODYL 5 MG PO TBEC: 5.0000 mg | DELAYED_RELEASE_TABLET | Freq: Every day | ORAL | Status: DC | PRN

## 2017-04-01 MED ORDER — PROPOFOL 10 MG/ML IV BOLUS
INTRAVENOUS | Status: DC | PRN
  Administered 2017-04-01: 75 mg via INTRAVENOUS

## 2017-04-01 MED ORDER — LIDOCAINE-EPINEPHRINE 2 %-1:100000 IJ SOLN
INTRAMUSCULAR | Status: DC | PRN
  Administered 2017-04-01: 15 mL

## 2017-04-01 MED ORDER — HYDRALAZINE HCL 20 MG/ML IJ SOLN: 5.0000 mg | INTRAMUSCULAR | Status: DC | PRN

## 2017-04-01 MED ORDER — FLEET ENEMA 7-19 GM/118ML RE ENEM: 1.0000 | ENEMA | Freq: Once | RECTAL | Status: DC | PRN

## 2017-04-01 MED ORDER — CEFAZOLIN SODIUM-DEXTROSE 1-4 GM/50ML-% IV SOLN
INTRAVENOUS | Status: DC | PRN
  Administered 2017-04-01: 2 g via INTRAVENOUS

## 2017-04-01 MED ORDER — SENNOSIDES 8.8 MG/5ML PO SYRP
5.0000 mL | ORAL_SOLUTION | Freq: Two times a day (BID) | ORAL | Status: DC
  Administered 2017-04-01 – 2017-04-05 (×10): 5 mL
  Filled 2017-04-01 (×10): qty 5

## 2017-04-01 MED ORDER — DEXAMETHASONE SODIUM PHOSPHATE 10 MG/ML IJ SOLN
INTRAMUSCULAR | Status: DC | PRN
  Administered 2017-04-01: 10 mg via INTRAVENOUS

## 2017-04-01 MED ORDER — ACETAMINOPHEN 325 MG PO TABS
650.0000 mg | ORAL_TABLET | ORAL | Status: DC | PRN
  Administered 2017-04-03: 650 mg via ORAL
  Filled 2017-04-01: qty 2

## 2017-04-01 MED ORDER — BUPIVACAINE-EPINEPHRINE (PF) 0.5% -1:200000 IJ SOLN
INTRAMUSCULAR | Status: DC | PRN
  Administered 2017-04-01: 15 mL

## 2017-04-01 MED ORDER — VITAL AF 1.2 CAL PO LIQD
1000.0000 mL | ORAL | Status: DC
  Administered 2017-04-01 – 2017-04-06 (×7): 1000 mL
  Filled 2017-04-01: qty 1000

## 2017-04-01 MED ORDER — CEFAZOLIN SODIUM-DEXTROSE 2-4 GM/100ML-% IV SOLN
2.0000 g | Freq: Three times a day (TID) | INTRAVENOUS | Status: AC
  Administered 2017-04-01 – 2017-04-02 (×2): 2 g via INTRAVENOUS
  Filled 2017-04-01 (×2): qty 100

## 2017-04-01 MED ORDER — SODIUM CHLORIDE 0.9 % IV SOLN
INTRAVENOUS | Status: DC
  Administered 2017-04-01 – 2017-04-06 (×4): via INTRAVENOUS

## 2017-04-01 MED ORDER — ORAL CARE MOUTH RINSE
15.0000 mL | OROMUCOSAL | Status: DC
  Administered 2017-04-01 – 2017-04-07 (×54): 15 mL via OROMUCOSAL

## 2017-04-01 MED ORDER — IPRATROPIUM-ALBUTEROL 0.5-2.5 (3) MG/3ML IN SOLN: 3.0000 mL | RESPIRATORY_TRACT | Status: DC | PRN

## 2017-04-01 MED ORDER — MIDAZOLAM HCL 2 MG/2ML IJ SOLN
INTRAMUSCULAR | Status: AC
  Filled 2017-04-01: qty 2

## 2017-04-01 MED ORDER — FENTANYL BOLUS VIA INFUSION
50.0000 ug | INTRAVENOUS | Status: DC | PRN
  Filled 2017-04-01: qty 50

## 2017-04-01 MED ORDER — VANCOMYCIN HCL IN DEXTROSE 1-5 GM/200ML-% IV SOLN: 1000.0000 mg | INTRAVENOUS | Status: DC

## 2017-04-01 MED ORDER — PIPERACILLIN-TAZOBACTAM 3.375 G IVPB
3.3750 g | Freq: Three times a day (TID) | INTRAVENOUS | Status: DC
  Filled 2017-04-01: qty 50

## 2017-04-01 MED ORDER — PROMETHAZINE HCL 25 MG PO TABS: 12.5000 mg | ORAL_TABLET | ORAL | Status: DC | PRN

## 2017-04-01 MED ORDER — ONDANSETRON HCL 4 MG/2ML IJ SOLN
4.0000 mg | INTRAMUSCULAR | Status: DC | PRN
  Administered 2017-04-25: 4 mg via INTRAVENOUS
  Filled 2017-04-01: qty 2

## 2017-04-01 MED ORDER — INSULIN ASPART 100 UNIT/ML ~~LOC~~ SOLN
0.0000 [IU] | SUBCUTANEOUS | Status: DC
  Administered 2017-04-01 (×2): 7 [IU] via SUBCUTANEOUS
  Administered 2017-04-01: 15 [IU] via SUBCUTANEOUS
  Administered 2017-04-02: 11 [IU] via SUBCUTANEOUS
  Administered 2017-04-02: 7 [IU] via SUBCUTANEOUS
  Administered 2017-04-02: 11 [IU] via SUBCUTANEOUS
  Administered 2017-04-02: 7 [IU] via SUBCUTANEOUS
  Administered 2017-04-02 (×2): 11 [IU] via SUBCUTANEOUS
  Administered 2017-04-03 – 2017-04-04 (×8): 7 [IU] via SUBCUTANEOUS
  Administered 2017-04-04: 15 [IU] via SUBCUTANEOUS
  Administered 2017-04-04 (×2): 7 [IU] via SUBCUTANEOUS
  Administered 2017-04-04: 15 [IU] via SUBCUTANEOUS
  Administered 2017-04-05 (×6): 7 [IU] via SUBCUTANEOUS
  Administered 2017-04-05: 11 [IU] via SUBCUTANEOUS
  Administered 2017-04-06 – 2017-04-08 (×12): 7 [IU] via SUBCUTANEOUS
  Administered 2017-04-08: 4 [IU] via SUBCUTANEOUS
  Administered 2017-04-08: 3 [IU] via SUBCUTANEOUS
  Administered 2017-04-08: 7 [IU] via SUBCUTANEOUS
  Administered 2017-04-08: 3 [IU] via SUBCUTANEOUS
  Administered 2017-04-09: 7 [IU] via SUBCUTANEOUS
  Administered 2017-04-09: 4 [IU] via SUBCUTANEOUS
  Administered 2017-04-09: 11 [IU] via SUBCUTANEOUS
  Administered 2017-04-09 (×3): 7 [IU] via SUBCUTANEOUS
  Administered 2017-04-10: 4 [IU] via SUBCUTANEOUS
  Administered 2017-04-10 (×2): 7 [IU] via SUBCUTANEOUS
  Administered 2017-04-10 (×2): 4 [IU] via SUBCUTANEOUS
  Administered 2017-04-10: 7 [IU] via SUBCUTANEOUS
  Administered 2017-04-11: 3 [IU] via SUBCUTANEOUS
  Administered 2017-04-11: 4 [IU] via SUBCUTANEOUS
  Administered 2017-04-11 (×2): 7 [IU] via SUBCUTANEOUS
  Administered 2017-04-11: 4 [IU] via SUBCUTANEOUS
  Administered 2017-04-11: 7 [IU] via SUBCUTANEOUS
  Administered 2017-04-12: 4 [IU] via SUBCUTANEOUS
  Administered 2017-04-12: 3 [IU] via SUBCUTANEOUS
  Administered 2017-04-12 (×2): 4 [IU] via SUBCUTANEOUS
  Administered 2017-04-12: 3 [IU] via SUBCUTANEOUS
  Administered 2017-04-13: 4 [IU] via SUBCUTANEOUS
  Administered 2017-04-13 (×2): 3 [IU] via SUBCUTANEOUS
  Administered 2017-04-13 – 2017-04-15 (×8): 4 [IU] via SUBCUTANEOUS
  Administered 2017-04-15: 3 [IU] via SUBCUTANEOUS
  Administered 2017-04-15: 4 [IU] via SUBCUTANEOUS
  Administered 2017-04-15 (×2): 7 [IU] via SUBCUTANEOUS
  Administered 2017-04-16: 3 [IU] via SUBCUTANEOUS
  Administered 2017-04-16 (×2): 4 [IU] via SUBCUTANEOUS
  Administered 2017-04-16: 3 [IU] via SUBCUTANEOUS
  Administered 2017-04-16: 4 [IU] via SUBCUTANEOUS
  Administered 2017-04-16: 3 [IU] via SUBCUTANEOUS
  Administered 2017-04-16: 4 [IU] via SUBCUTANEOUS
  Administered 2017-04-17 (×2): 3 [IU] via SUBCUTANEOUS
  Administered 2017-04-17 (×2): 4 [IU] via SUBCUTANEOUS
  Administered 2017-04-17: 3 [IU] via SUBCUTANEOUS
  Administered 2017-04-18 (×3): 4 [IU] via SUBCUTANEOUS
  Administered 2017-04-18: 3 [IU] via SUBCUTANEOUS
  Administered 2017-04-18: 4 [IU] via SUBCUTANEOUS
  Administered 2017-04-19: 3 [IU] via SUBCUTANEOUS
  Administered 2017-04-19: 4 [IU] via SUBCUTANEOUS
  Administered 2017-04-19: 3 [IU] via SUBCUTANEOUS
  Administered 2017-04-19 (×2): 4 [IU] via SUBCUTANEOUS
  Administered 2017-04-19: 3 [IU] via SUBCUTANEOUS
  Administered 2017-04-20: 4 [IU] via SUBCUTANEOUS
  Administered 2017-04-20: 3 [IU] via SUBCUTANEOUS
  Administered 2017-04-20 (×2): 4 [IU] via SUBCUTANEOUS
  Administered 2017-04-20: 3 [IU] via SUBCUTANEOUS
  Administered 2017-04-21 (×4): 4 [IU] via SUBCUTANEOUS
  Administered 2017-04-21 – 2017-04-22 (×3): 3 [IU] via SUBCUTANEOUS
  Administered 2017-04-22: 4 [IU] via SUBCUTANEOUS
  Administered 2017-04-22: 3 [IU] via SUBCUTANEOUS
  Administered 2017-04-22: 4 [IU] via SUBCUTANEOUS
  Administered 2017-04-22 – 2017-04-23 (×2): 3 [IU] via SUBCUTANEOUS
  Administered 2017-04-23: 4 [IU] via SUBCUTANEOUS
  Administered 2017-04-24: 3 [IU] via SUBCUTANEOUS
  Administered 2017-04-24 – 2017-04-26 (×9): 4 [IU] via SUBCUTANEOUS
  Administered 2017-04-26 (×2): 3 [IU] via SUBCUTANEOUS
  Administered 2017-04-26: 4 [IU] via SUBCUTANEOUS
  Administered 2017-04-26: 3 [IU] via SUBCUTANEOUS
  Administered 2017-04-27: 4 [IU] via SUBCUTANEOUS
  Administered 2017-04-27 – 2017-04-28 (×7): 3 [IU] via SUBCUTANEOUS
  Administered 2017-04-28 – 2017-04-29 (×2): 4 [IU] via SUBCUTANEOUS
  Administered 2017-04-29 (×2): 3 [IU] via SUBCUTANEOUS
  Administered 2017-04-29: 4 [IU] via SUBCUTANEOUS
  Administered 2017-04-29 – 2017-04-30 (×3): 3 [IU] via SUBCUTANEOUS
  Administered 2017-04-30 (×2): 4 [IU] via SUBCUTANEOUS
  Administered 2017-04-30 (×3): 3 [IU] via SUBCUTANEOUS
  Administered 2017-05-01 (×2): 4 [IU] via SUBCUTANEOUS
  Administered 2017-05-01 – 2017-05-02 (×5): 3 [IU] via SUBCUTANEOUS
  Administered 2017-05-02: 4 [IU] via SUBCUTANEOUS
  Administered 2017-05-02: 3 [IU] via SUBCUTANEOUS
  Administered 2017-05-02: 4 [IU] via SUBCUTANEOUS
  Administered 2017-05-03 – 2017-05-04 (×8): 3 [IU] via SUBCUTANEOUS
  Administered 2017-05-04: 4 [IU] via SUBCUTANEOUS
  Administered 2017-05-04 – 2017-05-05 (×7): 3 [IU] via SUBCUTANEOUS

## 2017-04-01 MED ORDER — THROMBIN 20000 UNITS EX KIT
PACK | CUTANEOUS | Status: DC | PRN
  Administered 2017-04-01: 20 mL via TOPICAL

## 2017-04-01 MED ORDER — ONDANSETRON HCL 4 MG PO TABS: 4.0000 mg | ORAL_TABLET | ORAL | Status: DC | PRN

## 2017-04-01 MED ORDER — BACITRACIN ZINC 500 UNIT/GM EX OINT
TOPICAL_OINTMENT | CUTANEOUS | Status: DC | PRN
  Administered 2017-04-01 (×2): 1 via TOPICAL

## 2017-04-01 MED ORDER — ONDANSETRON HCL 4 MG/2ML IJ SOLN
INTRAMUSCULAR | Status: DC | PRN
  Administered 2017-04-01: 4 mg via INTRAVENOUS
  Administered 2017-04-01: 50 mg via INTRAVENOUS

## 2017-04-01 MED ORDER — VANCOMYCIN HCL IN DEXTROSE 750-5 MG/150ML-% IV SOLN
750.0000 mg | Freq: Two times a day (BID) | INTRAVENOUS | Status: DC
  Administered 2017-04-01 – 2017-04-03 (×5): 750 mg via INTRAVENOUS
  Filled 2017-04-01 (×5): qty 150

## 2017-04-01 MED ORDER — FENTANYL 2500MCG IN NS 250ML (10MCG/ML) PREMIX INFUSION
25.0000 ug/h | INTRAVENOUS | Status: DC
  Administered 2017-03-31 – 2017-04-05 (×5): 50 ug/h via INTRAVENOUS
  Filled 2017-04-01 (×3): qty 250

## 2017-04-01 MED ORDER — NALOXONE HCL 0.4 MG/ML IJ SOLN: 0.0800 mg | INTRAMUSCULAR | Status: DC | PRN

## 2017-04-01 MED ORDER — BUPIVACAINE-EPINEPHRINE (PF) 0.5% -1:200000 IJ SOLN
INTRAMUSCULAR | Status: AC
  Filled 2017-04-01: qty 30

## 2017-04-01 MED ORDER — SODIUM CHLORIDE 0.9 % IV SOLN
INTRAVENOUS | Status: DC
  Filled 2017-04-01: qty 1

## 2017-04-01 MED ORDER — DOCUSATE SODIUM 100 MG PO CAPS: 100.0000 mg | ORAL_CAPSULE | Freq: Two times a day (BID) | ORAL | Status: DC

## 2017-04-01 MED ORDER — SODIUM CHLORIDE 0.9 % IR SOLN
Status: DC | PRN
  Administered 2017-04-01: 500 mL

## 2017-04-01 MED ORDER — SODIUM BICARBONATE 8.4 % IV SOLN
INTRAVENOUS | Status: AC
  Filled 2017-04-01: qty 50

## 2017-04-01 MED ORDER — VITAL HIGH PROTEIN PO LIQD: 1000.0000 mL | ORAL | Status: DC

## 2017-04-01 MED ORDER — PROPOFOL 10 MG/ML IV BOLUS
INTRAVENOUS | Status: AC
  Filled 2017-04-01: qty 20

## 2017-04-01 MED ORDER — INSULIN DETEMIR 100 UNIT/ML ~~LOC~~ SOLN
10.0000 [IU] | Freq: Every day | SUBCUTANEOUS | Status: DC
  Administered 2017-04-01: 10 [IU] via SUBCUTANEOUS
  Filled 2017-04-01: qty 0.1

## 2017-04-01 MED ORDER — HYDROCODONE-ACETAMINOPHEN 5-325 MG PO TABS: 1.0000 | ORAL_TABLET | ORAL | Status: DC | PRN

## 2017-04-01 MED ORDER — LIDOCAINE-EPINEPHRINE 2 %-1:100000 IJ SOLN
INTRAMUSCULAR | Status: AC
  Filled 2017-04-01: qty 1

## 2017-04-01 MED ORDER — SODIUM BICARBONATE 8.4 % IV SOLN
INTRAVENOUS | Status: DC | PRN
  Administered 2017-04-01: 50 meq via INTRAVENOUS

## 2017-04-01 MED ORDER — THROMBIN 5000 UNITS EX SOLR
CUTANEOUS | Status: AC
  Filled 2017-04-01: qty 5000

## 2017-04-01 MED ORDER — BACITRACIN ZINC 500 UNIT/GM EX OINT
TOPICAL_OINTMENT | CUTANEOUS | Status: AC
  Filled 2017-04-01: qty 28.35

## 2017-04-01 MED ORDER — NOREPINEPHRINE BITARTRATE 1 MG/ML IV SOLN
0.0000 ug/min | INTRAVENOUS | Status: DC
  Administered 2017-04-01: 4 ug/min via INTRAVENOUS
  Filled 2017-04-01 (×2): qty 16

## 2017-04-01 MED ORDER — HYDROMORPHONE HCL 1 MG/ML IJ SOLN: 0.5000 mg | INTRAMUSCULAR | Status: DC | PRN

## 2017-04-01 SURGICAL SUPPLY — 87 items
BLADE CLIPPER SURG (BLADE) ×2 IMPLANT
BLADE SURG 15 STRL LF DISP TIS (BLADE) IMPLANT
BLADE SURG 15 STRL SS (BLADE)
BLADE ULTRA TIP 2M (BLADE) IMPLANT
BUR ACORN 6.0 PRECISION (BURR) ×2 IMPLANT
BUR ROUTER D-58 CRANI (BURR) ×2 IMPLANT
BUR TAPERED ROUTER 3.0 (BURR) ×2 IMPLANT
CANISTER SUCT 3000ML PPV (MISCELLANEOUS) ×4 IMPLANT
CARTRIDGE OIL MAESTRO DRILL (MISCELLANEOUS) ×1 IMPLANT
CATH VENTRIC 35X38 W/TROCAR LG (CATHETERS) IMPLANT
CHLORAPREP W/TINT 26ML (MISCELLANEOUS) ×2 IMPLANT
CLIP RANEY DISP (INSTRUMENTS) ×4 IMPLANT
CLIP VESOCCLUDE MED 6/CT (CLIP) IMPLANT
COVER BACK TABLE 60X90IN (DRAPES) IMPLANT
DECANTER SPIKE VIAL GLASS SM (MISCELLANEOUS) IMPLANT
DIFFUSER DRILL AIR PNEUMATIC (MISCELLANEOUS) ×2 IMPLANT
DRAPE MICROSCOPE LEICA (MISCELLANEOUS) IMPLANT
DRAPE NEUROLOGICAL W/INCISE (DRAPES) ×2 IMPLANT
DRAPE SHEET LG 3/4 BI-LAMINATE (DRAPES) ×2 IMPLANT
DRAPE WARM FLUID 44X44 (DRAPE) ×2 IMPLANT
DRSG TEGADERM 2-3/8X2-3/4 SM (GAUZE/BANDAGES/DRESSINGS) ×2 IMPLANT
DRSG TELFA 3X8 NADH (GAUZE/BANDAGES/DRESSINGS) ×4 IMPLANT
DURAMATRIX ONLAY 2X2 (Neuro Prosthesis/Implant) ×2 IMPLANT
DURAMATRIX ONLAY 3X3 (Plate) ×2 IMPLANT
ELECT CAUTERY BLADE 6.4 (BLADE) ×2 IMPLANT
ELECT REM PT RETURN 9FT ADLT (ELECTROSURGICAL) ×2
ELECTRODE REM PT RTRN 9FT ADLT (ELECTROSURGICAL) ×1 IMPLANT
EVACUATOR 1/8 PVC DRAIN (DRAIN) IMPLANT
GAUZE SPONGE 4X4 12PLY STRL (GAUZE/BANDAGES/DRESSINGS) IMPLANT
GAUZE SPONGE 4X4 16PLY XRAY LF (GAUZE/BANDAGES/DRESSINGS) IMPLANT
GLOVE BIO SURGEON STRL SZ7 (GLOVE) ×2 IMPLANT
GLOVE BIOGEL PI IND STRL 7.0 (GLOVE) ×1 IMPLANT
GLOVE BIOGEL PI IND STRL 7.5 (GLOVE) ×4 IMPLANT
GLOVE BIOGEL PI INDICATOR 7.0 (GLOVE) ×1
GLOVE BIOGEL PI INDICATOR 7.5 (GLOVE) ×4
GLOVE ECLIPSE 7.5 STRL STRAW (GLOVE) ×6 IMPLANT
GLOVE EXAM NITRILE LRG STRL (GLOVE) IMPLANT
GLOVE EXAM NITRILE XL STR (GLOVE) IMPLANT
GLOVE EXAM NITRILE XS STR PU (GLOVE) ×2 IMPLANT
GLOVE SS BIOGEL STRL SZ 7.5 (GLOVE) ×3 IMPLANT
GLOVE SUPERSENSE BIOGEL SZ 7.5 (GLOVE) ×3
GOWN STRL REUS W/ TWL LRG LVL3 (GOWN DISPOSABLE) ×2 IMPLANT
GOWN STRL REUS W/ TWL XL LVL3 (GOWN DISPOSABLE) IMPLANT
GOWN STRL REUS W/TWL 2XL LVL3 (GOWN DISPOSABLE) ×4 IMPLANT
GOWN STRL REUS W/TWL LRG LVL3 (GOWN DISPOSABLE) ×2
GOWN STRL REUS W/TWL XL LVL3 (GOWN DISPOSABLE)
HEMOSTAT POWDER KIT SURGIFOAM (HEMOSTASIS) ×2 IMPLANT
HEMOSTAT SURGICEL 2X14 (HEMOSTASIS) IMPLANT
HOOK DURA 1/2IN (MISCELLANEOUS) ×2 IMPLANT
KIT BASIN OR (CUSTOM PROCEDURE TRAY) ×2 IMPLANT
KIT DRAIN CSF ACCUDRAIN (MISCELLANEOUS) IMPLANT
KIT ROOM TURNOVER OR (KITS) ×2 IMPLANT
MARKER SKIN DUAL TIP RULER LAB (MISCELLANEOUS) ×2 IMPLANT
NEEDLE HYPO 21X1.5 SAFETY (NEEDLE) ×2 IMPLANT
NEEDLE SPNL 18GX3.5 QUINCKE PK (NEEDLE) IMPLANT
NS IRRIG 1000ML POUR BTL (IV SOLUTION) ×4 IMPLANT
OIL CARTRIDGE MAESTRO DRILL (MISCELLANEOUS) ×2
PACK CRANIOTOMY (CUSTOM PROCEDURE TRAY) ×2 IMPLANT
PACK UNIVERSAL I (CUSTOM PROCEDURE TRAY) IMPLANT
PAD ARMBOARD 7.5X6 YLW CONV (MISCELLANEOUS) ×6 IMPLANT
PATTIES SURGICAL .5 X.5 (GAUZE/BANDAGES/DRESSINGS) IMPLANT
PATTIES SURGICAL .5 X3 (DISPOSABLE) IMPLANT
PATTIES SURGICAL .5X1.5 (GAUZE/BANDAGES/DRESSINGS) IMPLANT
PATTIES SURGICAL 1X1 (DISPOSABLE) IMPLANT
PERFORATOR LRG  14-11MM (BIT) ×1
PERFORATOR LRG 14-11MM (BIT) ×1 IMPLANT
PIN MAYFIELD SKULL DISP (PIN) ×2 IMPLANT
SPONGE NEURO XRAY DETECT 1X3 (DISPOSABLE) IMPLANT
SPONGE SURGIFOAM ABS GEL 100 (HEMOSTASIS) IMPLANT
STAPLER SKIN PROX WIDE 3.9 (STAPLE) ×2 IMPLANT
STAPLER VISISTAT 35W (STAPLE) ×6 IMPLANT
STRIP SURGICAL 1 X 6 IN (GAUZE/BANDAGES/DRESSINGS) IMPLANT
STRIP SURGICAL 1/2 X 6 IN (GAUZE/BANDAGES/DRESSINGS) IMPLANT
STRIP SURGICAL 1/4 X 6 IN (GAUZE/BANDAGES/DRESSINGS) IMPLANT
STRIP SURGICAL 2 X 6 IN (GAUZE/BANDAGES/DRESSINGS) IMPLANT
STRIP SURGICAL 3/4 X 6 IN (GAUZE/BANDAGES/DRESSINGS) IMPLANT
SUT ETHILON 3 0 FSL (SUTURE) IMPLANT
SUT NURALON 4 0 TR CR/8 (SUTURE) ×4 IMPLANT
SUT PROLENE 6 0 BV (SUTURE) IMPLANT
SUT VIC AB 2-0 CT1 18 (SUTURE) ×10 IMPLANT
SYR 30ML LL (SYRINGE) ×2 IMPLANT
TAPE CLOTH SURG 4X10 WHT LF (GAUZE/BANDAGES/DRESSINGS) ×2 IMPLANT
TOWEL GREEN STERILE (TOWEL DISPOSABLE) ×2 IMPLANT
TOWEL GREEN STERILE FF (TOWEL DISPOSABLE) IMPLANT
TRAY FOLEY W/METER SILVER 16FR (SET/KITS/TRAYS/PACK) IMPLANT
UNDERPAD 30X30 (UNDERPADS AND DIAPERS) IMPLANT
WATER STERILE IRR 1000ML POUR (IV SOLUTION) ×2 IMPLANT

## 2017-04-01 NOTE — Progress Notes (Signed)
STROKE TEAM PROGRESS NOTE   HISTORY OF PRESENT ILLNESS (per record) Joe Wilkinson is an 56 y.o. male who presented to [facility] in DKA. He was [place]argic [place] bus [place] and then became unresponsive. EMS reported O2 sat of 80% and BP of 90/40. His CBG revealed hyperglycemia. On arrival to [facility] the patient patient could tell the examiner his name but was unable to provide any other information. He was restless, moaning and moving all extremities, but not following commands. Labs were consistent with DKA and he was started on insulin drip. Initial CT head at [facility] showed no acute abnormality. He developed Kussmaul breathing requiring intubation. 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 He was transported to [facility] for further care. He has developed hypotension requiring pressors.   SUBJECTIVE (INTERVAL HISTORY) The patient was examined after he just returned from the OR after hemicraniotomy for malignant cerebral edema from large right brain stroke. No family at the bedside. Patient is sedated and intubated   OBJECTIVE Temp:  [97.1 F (36.2 C)-100.2 F (37.9 C)] 97.1 F (36.2 C) (08/24 2133) Pulse Rate:  [57-100] 75 (08/25 0715) Cardiac Rhythm: Other (Comment) (08/24 2000) Resp:  [13-22] 18 (08/25 0715) BP: (60-170)/(37-97) 112/71 (08/25 0715) SpO2:  [97 %-100 %] 98 % (08/25 0715) FiO2 (%):  [40 %] 40 % (08/25 0300) Weight:  [204 lb 5.9 oz (92.7 kg)] 204 lb 5.9 oz (92.7 kg) (08/25 0445)  CBC:  Recent Labs Lab 03/31/17 0151 04/01/17 0225  WBC 27.1* 22.2*  NEUTROABS  --  15.1*  HGB 12.6* 10.9*  HCT 37.4* 32.9*  MCV 79.2* 77.6*  PLT 134* 123*    Basic Metabolic Panel:  Recent Labs Lab 03/31/17 0151  03/31/17 1304 03/31/17 1708 04/01/17 0225  NA 137  < > 138  --  143  K 4.0  < > 3.2* 4.2 3.5  CL 108  <  > 110  --  114*  CO2 19*  < > 24  --  24  GLUCOSE 495*  < > 211*  --  100*  BUN 39*  < > 25*  --  14  CREATININE 1.42*  < > 0.86  --  0.90  CALCIUM 7.7*  < > 7.9*  --  8.2*  MG 1.8  --   --   --  2.0  PHOS 1.6*  --   --   --  <1.0*  < > = values in this interval not displayed.  Lipid Panel: No results found for: CHOL, TRIG, HDL, CHOLHDL, VLDL, LDLCALC HgbA1c: No results found for: HGBA1C Urine Drug Screen: No results found for: LABOPIA, COCAINSCRNUR, LABBENZ, AMPHETMU, THCU, LABBARB  Alcohol Level No results found for: [facility]  IMAGING  Dg Chest 1 View 03/30/2017 Stable support apparatus. Interval placement of left internal jugular catheter, with distal tip in expected position of SVC. No acute cardiopulmonary abnormality seen.    Dg Abd 1 View 03/30/2017 No evidence of bowel obstruction or ileus. Distal tip of orogastric tube is seen in expected position of proximal stomach.    Ct Head Wo Contrast 03/31/2017 1. Progressive large right MCA territory infarct with involvement of the right occipital lobe likely related to a fetal type right posterior cerebral artery.  2. Diffuse edema and mass effect with 12 mm midline shift and partial effacement of the para mesencephalic  cisterns.  3. Developing left posterior watershed territory infarct.  4. Nasal fluid and bilateral sinus disease likely related to intubation.     Ct Head Wo Contrast 03/30/2017 No acute intracranial abnormality identified. Unremarkable CT of the brain.     Dg Chest Portable 1 View 03/30/2017 1. Satisfactory endotracheal tube position at 3.4 cm above the carina.  2. There appears to be a gastric tube as well although the tip is not well visualized and can only be followed to the level of the GE junction.      PHYSICAL EXAM   Vitals:   04/01/17 0700 04/01/17 0715 04/01/17 0730 04/01/17 0750  BP: 121/74 112/71 128/65   Pulse: 76 75 66   Resp: 18 18 18    SpO2: 98% 98% 99% 99%  Weight:      Height:        55 African-American male who is sedated and intubated. Craniotomy bandage on the right skull  . Afebrile. Head is nontraumatic. Neck is supple without bruit.    Cardiac exam no murmur or gallop. Lungs are clear to auscultation. Distal pulses are well felt. Neurological Exam :  Sedated intubated unresponsive. Eyes are closed. There is bilateral swelling of eyelids and conjunctiva. Right gaze deviation. Dolls eye movements are sluggish. Right pupil 3 mm not reactive left 4 mm not reactive. Fundi could not be visualized Right corneal reflexes present and left is sluggish. Gag reflex is absent but cough is present on suctioning. Patient is able to move right upper and lower extremity partially to withdrawal response to pain. Dense left hemiplegia with decreased tone. Reflexes present on the right and absent on the left. Left plantar upgoing right equivocal. ASSESSMENT/PLAN Mr. Joe Wilkinson is a 56 y.o. male with history of diabetes mellitus (other past medical history unknown at this time) presenting with unresponsiveness. He did not receive IV t-PA due to decompressive right frontotemporoparietal craniectomy for decompression.  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  Resultant  left hemiplegia and gaze paralysis  CT head - Progressive large right MCA territory infarct with involvement of the right occipital lobe likely related to a fetal type right posterior cerebral artery. Diffuse edema and mass effect with 12 mm midline shift and partial effacement of the para mesencephalic cisterns. Developing left posterior watershed territory infarct.   MRI head - not performed  MRA head - not performed  Carotid Doppler - not performed  2D Echo - not performed  LDL - will order   HgbA1c - will order   VTE prophylaxis  Diet NPO time specified  No antithrombotic prior to admission, now on No  antithrombotic  Patient will be counseled to be compliant with his antithrombotic medications  Ongoing aggressive stroke risk factor management  Therapy recommendations:  pending  Disposition:  Pending  Hypertension  Stable  Permissive hypertension (OK if < 220/120) but gradually normalize in 5-7 days  Long-term BP goal normotensive  Hyperlipidemia  Home meds: No lipid lowering medications prior to admission  LDL pending, goal < 70    Diabetes  HgbA1c pending, goal < 7.0  Uncontrolled  Other Stroke Risk Factors  Obesity, Body mass index is 29.32 kg/m., recommend weight loss, diet and exercise as appropriate    Other Active Problems  Right frontotemporoparietal craniectomy for decompression this AM - Dr Cyndy Freeze.  Currently not on aspirin.  DKA  3% hypertonic saline  Intubated and sedated  Pressors for hypotension  Tube feedings  Sepsis - vancomycin and Zosyn started 04/01/2017  Hospital day # 0  I have personally examined this patient, reviewed notes, independently viewed imaging studies, participated in medical decision making and plan of care.ROS completed by me personally and pertinent positives fully documented  I have made any additions or clarifications directly to the above note.  Presented initially to an outside hospital with unresponsive state and diabetic ketoacidosis but overnight was noted to have left hemiplegia and repeat CT scan showed a large right middle cerebral artery infarct with malignant cerebral edema and midline shift and underwent emergent right hemicraniectomy. Recommend close neurological monitoring. Continue hypertonic saline and will repeat CT scan and CT angiogram more morning. If there is no significant edema may consider discontinuing hypertonic saline. Prognosis remains guarded. No family available at the bedside. Discussion. Check echocardiogram and lipid profile and ongoing stroke evaluation. Management of sepsis and medical  problems as per PCCM team. Discuss with Dr.Sood. This patient is critically ill and at significant risk of neurological worsening, death and care requires constant monitoring of vital signs, hemodynamics,respiratory and cardiac monitoring, extensive review of multiple databases, frequent neurological assessment, discussion with family, other specialists and medical decision making of high complexity.I have made any additions or clarifications directly to the above note.This critical care time does not reflect procedure time, or teaching time or supervisory time of PA/NP/Med Resident etc but could involve care discussion time.  I spent 40 minutes of neurocritical care time  in the care of  this patient.      Antony Contras, MD Medical Director [facility] Pager: [phone] 04/01/2017 12:47 PM   To contact Stroke Continuity provider, please refer to [email or web address] After hours, contact General Neurology

## 2017-04-01 NOTE — Progress Notes (Signed)
Nutrition Follow-up  DOCUMENTATION CODES:  Not applicable  INTERVENTION:  Initiate TF via OGT with Vital AF 1.2 at goal rate of 70 ml/h (1680 ml per day)  to provide 2016 kcals, 126 gm protein, 1362 ml free water daily.  NUTRITION DIAGNOSIS:  Inadequate oral intake related to inability to eat as evidenced by NPO status.  GOAL:  Patient will meet greater than or equal to 90% of their needs  MONITOR:  Vent status, Labs, Weight trends, I & O's, TF tolerance  REASON FOR ASSESSMENT:  Consult Enteral/tube feeding initiation and management  ASSESSMENT:  56 y/o male w/o known PMHx. Found lethargic at bus stop and became unresponsive. Admitted to [facility] with DKA and intubated after having respiratory distress. On 8/24, pt found not to be moving L side of body and stat CT head revealed large Right MCA infarct w/ diffuse edema and 2mm midline shift. Transferred to [facility] for craniectomy 8/25. RD consulted for TF  Pt intubated, sedated. Unable to provide any history.   Patient has had very high BGs. Had presented in DKA. Will likely need increase in insulin upon initiation of TF. Discussed with RN.   Due to septic shock per MD, Glucerna or other high fiber formula not appropriate.   Patient is currently intubated on ventilator support MV:  9.7 L/min Temp (24hrs), Avg:98.5 F (36.9 C), Min:97.1 F (36.2 C), Max:98.8 F (37.1 C)  Propofol: None  Labs: Albumin:2.2, Glu: 303-323 Meds: Colace, ppi, senokot, Fentanyl, IV abx, Hypertonic saline, K phos, pressor support:Norepi   Recent Labs Lab 03/31/17 0151  03/31/17 1304 03/31/17 1708 04/01/17 0225 04/01/17 1124  NA 137  < > 138  --  143 145  K 4.0  < > 3.2* 4.2 3.5 4.4  CL 108  < > 110  --  114* 117*  CO2 19*  < > 24  --  24 20*  BUN 39*  < > 25*  --  14 10  CREATININE 1.42*  < > 0.86  --  0.90 0.93  CALCIUM 7.7*  < > 7.9*  --  8.2* 7.4*  MG 1.8  --   --   --  2.0 1.7  PHOS 1.6*  --   --   --  <1.0* 3.0  GLUCOSE 495*  < > 211*   --  100* 303*  < > = values in this interval not displayed.  Diet Order:  Diet NPO time specified  Skin: Surgical incision to R skull  Last BM:  Unknown  Height:  Ht Readings from Last 1 Encounters:  04/01/17 5\' 10"  (1.778 m)   Weight:  Wt Readings from Last 1 Encounters:  04/01/17 204 lb 5.9 oz (92.7 kg)   Wt Readings from Last 10 Encounters:  04/01/17 204 lb 5.9 oz (92.7 kg)  03/30/17 250 lb (113.4 kg)   Ideal Body Weight:  75.45 kg  BMI:  Body mass index is 29.32 kg/m.  Estimated Nutritional Needs:  Kcal:  2002 KCals (PSU 2003 B) Protein:  120-135kcals/day Fluid:  Per MD  EDUCATION NEEDS:  No education needs identified at this time  Burtis Junes RD, LDN, [facility] Pager: [phone] 04/01/2017 12:39 PM

## 2017-04-01 NOTE — Anesthesia Procedure Notes (Signed)
Arterial Line Insertion Start/End8/25/2018 8:00 AM, 04/01/2017 8:10 AM Performed by: Laretta Alstrom, CRNA  Patient location: OR. Preanesthetic checklist: patient identified Patient sedated Left, radial was placed Catheter size: 20 G Hand hygiene performed  and maximum sterile barriers used   Attempts: 1 Procedure performed without using ultrasound guided technique. Following insertion, dressing applied and Biopatch. Post procedure assessment: unchanged  Patient tolerated the procedure well with no immediate complications.

## 2017-04-01 NOTE — Anesthesia Preprocedure Evaluation (Signed)
Anesthesia Evaluation  Patient identified by MRN, date of birth, ID band Patient unresponsive    Reviewed: Unable to perform ROS - Chart review only  Airway Mallampati: Intubated       Dental   Pulmonary  Intubated   Pulmonary exam normal        Cardiovascular Normal cardiovascular exam     Neuro/Psych Intracerebral bleed with mass effect and midline shift. On hypertonic saline. CVA    GI/Hepatic   Endo/Other  diabetes, Poorly Controlled, Type 2, Insulin DependentPresented in DKA  Renal/GU      Musculoskeletal   Abdominal   Peds  Hematology   Anesthesia Other Findings   Reproductive/Obstetrics                             Anesthesia Physical Anesthesia Plan  ASA: IV  Anesthesia Plan: General   Post-op Pain Management:    Induction: Intravenous  PONV Risk Score and Plan: 2 and Ondansetron and Treatment may vary due to age or medical condition  Airway Management Planned: Oral ETT  Additional Equipment:   Intra-op Plan:   Post-operative Plan: Post-operative intubation/ventilation  Informed Consent: I have reviewed the patients History and Physical, chart, labs and discussed the procedure including the risks, benefits and alternatives for the proposed anesthesia with the patient or authorized representative who has indicated his/her understanding and acceptance.     Plan Discussed with: CRNA and Surgeon  Anesthesia Plan Comments:         Anesthesia Quick Evaluation

## 2017-04-01 NOTE — Progress Notes (Addendum)
PULMONARY / CRITICAL CARE MEDICINE   Name: Carsen Leaf MRN: [id number] DOB: 07-20-1961    ADMISSION DATE:  03/31/2017 CHIEF COMPLAINT:  CVA  HISTORY OF PRESENT ILLNESS:   56yo M found unresponsive at bustop admitted to [facility] in DKA, concern for sepsis w/o source, later developed hemiparesis and found to have acute Right MCA CVA.  Now transferred to [facility] ICU for NeuroSx care, seen by NSX and Neuro.  Tentative plan for crani in AM. He is intubated and sedated and unable to provide any HPI, ROS, PMH, PSH, FH, home meds, allergies.  All Hx form medical record.  SUBJECTIVE: Remains on vent, pressors.  VITAL SIGNS: BP 128/65   Pulse 66   Resp 18   Ht 5\' 10"  (1.778 m)   Wt 204 lb 5.9 oz (92.7 kg)   SpO2 99%   BMI 29.32 kg/m   VENTILATOR SETTINGS: Vent Mode: PRVC FiO2 (%):  [40 %] 40 % Set Rate:  [18 bmp] 18 bmp Vt Set:  [550 mL] 550 mL PEEP:  [5 cmH20] 5 cmH20 Plateau Pressure:  [16 cmH20-20 cmH20] 16 cmH20  INTAKE / OUTPUT: I/O last 3 completed shifts: In: 1137.9 [I.V.:477.9; IV Piggyback:660] Out: 800 [Urine:800]  PHYSICAL EXAMINATION:  General - on vent ENT - ETT in place Cardiac - regular, no murmur Chest - no wheeze, rales Abd - soft, non tender Ext - no edema Skin - no rashes Neuro - moves Rt arm  BMET  Recent Labs Lab 03/31/17 0945 03/31/17 1304 03/31/17 1708 04/01/17 0225  NA 141 138  --  143  K 3.3* 3.2* 4.2 3.5  CL 112* 110  --  114*  CO2 24 24  --  24  BUN 29* 25*  --  14  CREATININE 1.05 0.86  --  0.90  GLUCOSE 146* 211*  --  100*    Electrolytes  Recent Labs Lab 03/31/17 0151  03/31/17 0945 03/31/17 1304 04/01/17 0225  CALCIUM 7.7*  < > 7.9* 7.9* 8.2*  MG 1.8  --   --   --  2.0  PHOS 1.6*  --   --   --  <1.0*  < > = values in this interval not displayed.  CBC  Recent Labs Lab 03/30/17 1808 03/31/17 0151 04/01/17 0225  WBC 24.0* 27.1* 22.2*  HGB 14.6 12.6* 10.9*  HCT 49.0 37.4* 32.9*  PLT 159 134* 123*    Coag's  Recent  Labs Lab 03/31/17 1114 04/01/17 0225  APTT 33 31  INR 1.12  --     Sepsis Markers  Recent Labs Lab 03/31/17 1114 04/01/17 0225  PROCALCITON 4.28 2.53    ABG  Recent Labs Lab 03/30/17 2321 03/31/17 0542 04/01/17 0250  PHART 7.21* 7.44 7.483*  PCO2ART 21* 36 28.3*  PO2ART 154* 148* 136*    Liver Enzymes  Recent Labs Lab 03/30/17 1808 04/01/17 0225  AST 23 40  ALT 14* 13*  ALKPHOS 94 58  BILITOT 2.3* 1.7*  ALBUMIN 3.3* 2.2*    Cardiac Enzymes  Recent Labs Lab 03/30/17 1808  TROPONINI <0.03    Glucose  Recent Labs Lab 04/01/17 0122 04/01/17 0232 04/01/17 0344 04/01/17 0451 04/01/17 0553 04/01/17 0708  GLUCAP 115* 95 98 105* 140* 139*    Imaging Ct Head Wo Contrast  Result Date: 03/31/2017 CLINICAL DATA:  Acute metabolic encephalopathy. Diabetic ketoacidosis. EXAM: CT HEAD WITHOUT CONTRAST TECHNIQUE: Contiguous axial images were obtained from the base of the skull through the vertex without intravenous contrast. COMPARISON:  CT  head without contrast 03/30/2017. FINDINGS: Brain: A large right MCA territory nonhemorrhagic infarct is present. There is diffuse loss of gray-white differentiation the involving the right frontal, parietal, and much of the occipital lobe. There is some sparing of the anteromedial right frontal lobe. There is progressive mass effect with 12 mm midline shift. There is partial effacement of the para mesencephalic cistern. The quadrigeminal plate cistern is intact. The fourth ventricle is normal. A posterior watershed territory infarct is noted on the left. No other acute or focal left-sided infarcts are present. Vascular: A hyperdense right MCA is evident the against the backdrop of diffuse infarcted territory. No other hyperdense vessel is present. Skull: The calvarium is intact. No focal lytic or blastic lesions are present. Sinuses/Orbits: Nasopharyngeal fluid is likely related to intubation. There is mild mucosal thickening  involving the maxillary left maxillary sinus and bilateral ethmoid sinuses. Minimal fluid is present in the sphenoid sinus. A remote medial orbital blowout fracture is noted. The visualized globes and orbits are otherwise unremarkable. IMPRESSION: 1. Progressive large right MCA territory infarct with involvement of the right occipital lobe likely related to a fetal type right posterior cerebral artery. 2. Diffuse edema and mass effect with 12 mm midline shift and partial effacement of the para mesencephalic cisterns. 3. Developing left posterior watershed territory infarct. 4. Nasal fluid and bilateral sinus disease likely related to intubation. These results were called by telephone at the time of interpretation on 03/31/2017 at 9:13 pm to Dr. Harrel Lemon , who verbally acknowledged these results. Electronically Signed   By: San Morelle M.D.   On: 03/31/2017 21:15     STUDIES:  LP 8/24 >> glucose 205, RBC 16, protein 62, WBC 5 CTH 8/24 >> Large Rt MCA  CULTURES: CSF 8/24>> BCX 8/24>> UCX 8/24>> Sputum 8/24>>  ANTIBIOTICS: vanco 8/24 >>  Zosyn 8/24 >>   SIGNIFICANT EVENTS: 8/23 Admit [facility] 8/23 8/24 Transfer to [facility] after Rt MCA CVA 8/25 Decompressive craniectomy  LINES/TUBES: ETT 8/23 >> Lt IJ CVL 8/23 >>  ASSESSMENT / PLAN:  PULMONARY A: Compromised airway. P:   Full vent support  CARDIOVASCULAR A:  Septic shock. P:  Wean pressors to keep MAP > 65 and SBP > 120 per neurology  RENAL A:   Medically induced hypernatremia. Hypophosphatemia. P:   Goal Na 150 to 155 per neurology F/u electrolytes  GASTROINTESTINAL A:   Nutrition. P:   Tube feeds  HEMATOLOGIC A:   Anemia of critical illness. P:  F/u CBC  INFECTIOUS A:   Sepsis >> source unclear. P:   Day 2 of vancomycin, zosyn F/u cultures from [place]:   DKA - resolved. DM type I. P:   SSI  NEUROLOGIC A:   Large right MCA CVA. P:   Per neurology and neurosurgery  CC time  32 minutes  Chesley Mires, MD [place] 04/01/2017, 11:00 AM Pager:  [phone] After 3pm call: [phone]

## 2017-04-01 NOTE — H&P (Signed)
PULMONARY / CRITICAL CARE MEDICINE   Name: Joe Wilkinson MRN: [id number] DOB: 28-Sep-1960    ADMISSION DATE:  03/31/2017 CHIEF COMPLAINT:  CVA  HISTORY OF PRESENT ILLNESS:   56yo M found unresponsive at [place] admitted to [facility] in DKA, concern for sepsis w/o source, later developed hemiparesis and found to have acute Right MCA CVA.  Now transferred to [facility] ICU for NeuroSx care, seen by NSX and Neuro.  Tentative plan for crani in AM. He is intubated and sedated and unable to provide any HPI, ROS, PMH, PSH, FH, home meds, allergies.  All Hx form medical record.  PAST MEDICAL HISTORY :  He  has no past medical history on file.  PAST SURGICAL HISTORY: He  has no past surgical history on file.  No Known Allergies  No current facility-administered medications on file prior to encounter.    No current outpatient prescriptions on file prior to encounter.    FAMILY HISTORY:  His has no family status information on file.    SOCIAL HISTORY: He    REVIEW OF SYSTEMS:     VITAL SIGNS: BP 135/80   Pulse 75   Resp 18   SpO2 99%   HEMODYNAMICS:   Levo 16mcg, Neo 36mcg  VENTILATOR SETTINGS: Vent Mode: PRVC FiO2 (%):  [40 %] 40 % Set Rate:  [18 bmp] 18 bmp Vt Set:  [550 mL] 550 mL PEEP:  [5 cmH20] 5 cmH20 Plateau Pressure:  [20 cmH20] 20 cmH20  INTAKE / OUTPUT: No intake/output data recorded.  PHYSICAL EXAMINATION: General:  NAD Neuro:  Responds to noxious w/rue HEENT:  ATNC Cardiovascular:  RRR no murmur, no cce Lungs:  CTA, no wheezing Abdomen:  Soft ntnd Musculoskeletal:  Left hemiparesis Skin:  Warm dry intact  LABS: AKI resolved hyperK resolved leujocytosis AG closed  BMET  Recent Labs Lab 03/31/17 0546 03/31/17 0945 03/31/17 1304 03/31/17 1708  NA 140 141 138  --   K 3.6 3.3* 3.2* 4.2  CL 112* 112* 110  --   CO2 24 24 24   --   BUN 34* 29* 25*  --   CREATININE 1.21 1.05 0.86  --   GLUCOSE 271* 146* 211*  --     Electrolytes  Recent Labs Lab  03/31/17 0151 03/31/17 0546 03/31/17 0945 03/31/17 1304  CALCIUM 7.7* 7.6* 7.9* 7.9*  MG 1.8  --   --   --   PHOS 1.6*  --   --   --     CBC  Recent Labs Lab 03/30/17 1808 03/31/17 0151  WBC 24.0* 27.1*  HGB 14.6 12.6*  HCT 49.0 37.4*  PLT 159 134*    Coag's  Recent Labs Lab 03/31/17 1114  APTT 33  INR 1.12    Sepsis Markers  Recent Labs Lab 03/31/17 1114  PROCALCITON 4.28    ABG  Recent Labs Lab 03/30/17 2321 03/31/17 0542  PHART 7.21* 7.44  PCO2ART 21* 36  PO2ART 154* 148*    Liver Enzymes  Recent Labs Lab 03/30/17 1808  AST 23  ALT 14*  ALKPHOS 94  BILITOT 2.3*  ALBUMIN 3.3*    Cardiac Enzymes  Recent Labs Lab 03/30/17 1808  TROPONINI <0.03    Glucose  Recent Labs Lab 03/31/17 1749 03/31/17 1855 03/31/17 2000 03/31/17 2113 03/31/17 2159 04/01/17 0013  GLUCAP 283* 318* 290* 258* 215* 152*    Imaging Ct Head Wo Contrast  Result Date: 03/31/2017 CLINICAL DATA:  Acute metabolic encephalopathy. Diabetic ketoacidosis. EXAM: CT HEAD WITHOUT CONTRAST  TECHNIQUE: Contiguous axial images were obtained from the base of the skull through the vertex without intravenous contrast. COMPARISON:  CT head without contrast 03/30/2017. FINDINGS: Brain: A large right MCA territory nonhemorrhagic infarct is present. There is diffuse loss of gray-white differentiation the involving the right frontal, parietal, and much of the occipital lobe. There is some sparing of the anteromedial right frontal lobe. There is progressive mass effect with 12 mm midline shift. There is partial effacement of the para mesencephalic cistern. The quadrigeminal plate cistern is intact. The fourth ventricle is normal. A posterior watershed territory infarct is noted on the left. No other acute or focal left-sided infarcts are present. Vascular: A hyperdense right MCA is evident the against the backdrop of diffuse infarcted territory. No other hyperdense vessel is present.  Skull: The calvarium is intact. No focal lytic or blastic lesions are present. Sinuses/Orbits: Nasopharyngeal fluid is likely related to intubation. There is mild mucosal thickening involving the maxillary left maxillary sinus and bilateral ethmoid sinuses. Minimal fluid is present in the sphenoid sinus. A remote medial orbital blowout fracture is noted. The visualized globes and orbits are otherwise unremarkable. IMPRESSION: 1. Progressive large right MCA territory infarct with involvement of the right occipital lobe likely related to a fetal type right posterior cerebral artery. 2. Diffuse edema and mass effect with 12 mm midline shift and partial effacement of the para mesencephalic cisterns. 3. Developing left posterior watershed territory infarct. 4. Nasal fluid and bilateral sinus disease likely related to intubation. These results were called by telephone at the time of interpretation on 03/31/2017 at 9:13 pm to Dr. Harrel Lemon , who verbally acknowledged these results. Electronically Signed   By: San Morelle M.D.   On: 03/31/2017 21:15     STUDIES:  LP 8/24 CTH 8/24 Large Rt MCA  CULTURES: CSF 8/24>> BCX 8/24>> UCX 8/24>> Sputum 8/24>>  ANTIBIOTICS: vanco 8/24 Zosyn 8/24  SIGNIFICANT EVENTS: Admit [facility] 8/23 LP 8/24 T/f MC 8/24 for Rt MCA CVA  LINES/TUBES: Intubated 8/23 Left IJ 8/23  ASSESSMENT / PLAN:  PULMONARY A: Intubated for airway protection P:   LTVV PaCO2 35-40 for cerebral perfusion VAP PPX  CARDIOVASCULAR A:  Shock - unclear type P:  SBP goal  120-140 for cerebral perfusion Tele EKG  RENAL A:   AKI - resolved P:   3% NS goal Na 150-155 for edema  GASTROINTESTINAL A:   GI PPX P:   PPI  HEMATOLOGIC A:   DVT PPX P:  SCDs since pending NSx  INFECTIOUS A:   Possibel sepsis unknown source P:   vanco and zosyn, repeat procal Pharmacy consult  ENDOCRINE A:   DKA - resolved DM   P:   IV insulin  NEUROLOGIC A:   Large  right MCA CVA P:   RASS goal: -1 to -2 q1hr neurochecks Neuro consulted Nsx consulted   FAMILY  - Inter-disciplinary family meet or Palliative Care meeting due by:  04/08/17  Upon my evaluation, this patient had a high probability of imminent or life-threatening deterioration due to Shock, large left MCA stroke, risk of herniation, intubated ofr airway protection, risk of recurrent DKA  high complexity decision making to assess, manipulate, and support vital organ system failure including mechanical ventilation, vasopressors  I have personally provided 60 minutes of critical care time exclusive of time spent on separately billable procedures and education. Time includes review and summation of previous medical record, laboratory data, radiology results, independent review of CT head, coordination of care  with Neurology an dNeurosurgery, and monitoring for potential decompensation. Interventions were performed as documented above.  Condition: Critical Prognosis: Guarded Code Status: Full  Charlesetta Garibaldi, MD [place] Pager: [phone]   Pulmonary and [place] Pager: [phone]  04/01/2017, 1:22 AM

## 2017-04-01 NOTE — Anesthesia Procedure Notes (Signed)
Date/Time: 04/01/2017 7:30 AM Performed by: Izora Gala Oxygen Delivery Method: Circle system utilized Induction Type: Inhalational induction with existing ETT Placement Confirmation: positive ETCO2 and breath sounds checked- equal and bilateral

## 2017-04-01 NOTE — Progress Notes (Signed)
Patient is intubated and non-communicative Family cannot be reached by telephone Without decompressive craniectomy he is at very high risk of devastating neurological outcome due to brainstem compression from his large right hemispheric ischemic stroke. In my opinion the benefits of decompressive craniectomy vastly outweigh the risks.

## 2017-04-01 NOTE — Consult Note (Signed)
NEURO HOSPITALIST CONSULT NOTE   Requestig physician: Dr. 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 male who presented to [facility] in DKA. [place] and then became unresponsive. EMS reported O2 sat of 80% and BP of 90/40. His CBG revealed hyperglycemia. On arrival to [facility] the patient patient could tell the examiner his name but was unable to provide any other information. He was restless, moaning and moving all extremities, not following commands. Labs were consistent with DKA and he was started on insulin drip. Initial CT head at [facility] showed no acute abnormality. He developed Kussmaul breathing requiring intubation. 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 He was transported to [facility] for further care. He has developed hypotension requiring pressors.   His PMHx is unknown. The patient is unable to provide information due to intubation and sedation.   No past medical history on file.  No past surgical history on file.  No family history on file.  Social History:  has no tobacco, alcohol, and drug history on file.  No Known Allergies  MEDICATIONS:                                                                                                                     Scheduled: . fentaNYL (SUBLIMAZE) injection  50 mcg Intravenous Once  . ipratropium-albuterol  3 mL Nebulization Q6H  . pantoprazole sodium  40 mg Per Tube Daily   Continuous: . sodium chloride    . fentaNYL infusion INTRAVENOUS 50 mcg/hr  (03/31/17 2300)  . insulin (NOVOLIN-R) infusion 0.8 Units/hr (04/01/17 0602)  . norepinephrine (LEVOPHED) Adult infusion 2 mcg/min (04/01/17 0603)  . piperacillin-tazobactam (ZOSYN)  IV 3.375 g (04/01/17 0500)  . potassium PHOSPHATE IVPB (mmol)    . sodium chloride (  hypertonic) 50 mL/hr (04/01/17 0357)  . vancomycin 750 mg (04/01/17 0527)    ROS:                                                                                                                                       Unable to obtain due to intubation and sedation.   Blood pressure 132/65, pulse 65, resp. rate 18, height 5\' 10"  (1.778 m), weight 92.7 kg (204 lb 5.9 oz), SpO2 99 %.  General Examination:                                                                                                      HEENT-  [place]/AT    Lungs- Intubated Extremities- Warm and well-perfused  Neurological Examination Mental Status: Intubated and sedated. Able to wiggle toes of right foot to command. Opens eyes to noxious.  Cranial Nerves: II: No blink to threat on the left. Pupils sluggishly reactive and equal.   III,IV, VI: Right gaze deviation. Unable to gaze to left on command. Unable to overcome with oculocephalic maneuver.  V,VII: No gross facial asymmetry in the context of intubation and sedation. Does not react to left sided tactile stimuli  VIII: hearing grossly intact to voice IX,X: Intubated XI: Head preferentially rotated to right XII: Intubated Motor: Moves RUE and RLE antigravity and resists examiner. LUE and LLE flaccid with no spontaneous movement or movement to noxious stimuli.  Sensory: No response to tactile or noxious stimuli LUE and LLE. Reacts normally to right sided stimuli.  Deep Tendon Reflexes: 1+ brachioradialis bilaterally. 2+ patellae bilaterally.  Plantars: Right: downgoing  Left: mute Cerebellar: Unable to assess during venipuncture on the right. No movement of LUE.  Gait: Unable to assess   Lab  Results: Basic Metabolic Panel:  Recent Labs Lab 03/31/17 0151 03/31/17 0546 03/31/17 0945 03/31/17 1304 03/31/17 1708 04/01/17 0225  NA 137 140 141 138  --  143  K 4.0 3.6 3.3* 3.2* 4.2 3.5  CL 108 112* 112* 110  --  114*  CO2 19* 24 24 24   --  24  GLUCOSE 495* 271* 146* 211*  --  100*  BUN 39* 34* 29* 25*  --  14  CREATININE 1.42* 1.21 1.05 0.86  --  0.90  CALCIUM 7.7* 7.6* 7.9* 7.9*  --  8.2*  MG 1.8  --   --   --   --  2.0  PHOS 1.6*  --   --   --   --  <  1.0*    Liver Function Tests:  Recent Labs Lab 03/30/17 1808 04/01/17 0225  AST 23 40  ALT 14* 13*  ALKPHOS 94 58  BILITOT 2.3* 1.7*  PROT 7.6 5.3*  ALBUMIN 3.3* 2.2*    Recent Labs Lab 03/30/17 1808  LIPASE 181*   No results for input(s): AMMONIA in the last 168 hours.  CBC:  Recent Labs Lab 03/30/17 1808 03/31/17 0151 04/01/17 0225  WBC 24.0* 27.1* 22.2*  NEUTROABS  --   --  15.1*  HGB 14.6 12.6* 10.9*  HCT 49.0 37.4* 32.9*  MCV 89.2 79.2* 77.6*  PLT 159 134* 123*    Cardiac Enzymes:  Recent Labs Lab 03/30/17 1808  TROPONINI <0.03    Lipid Panel: No results for input(s): CHOL, TRIG, HDL, CHOLHDL, VLDL, LDLCALC in the last 168 hours.  CBG:  Recent Labs Lab 04/01/17 0013 04/01/17 0122 04/01/17 0232 04/01/17 0344 04/01/17 0451  GLUCAP 152* 115* 95 98 105*    Microbiology: Results for orders placed or performed during the hospital encounter of 03/30/17  MRSA PCR Screening     Status: None   Collection Time: 03/30/17 10:01 PM  Result Value Ref Range Status   MRSA by PCR NEGATIVE NEGATIVE Final    Comment:        The GeneXpert MRSA Assay (FDA approved for NASAL specimens only), is one component of a comprehensive MRSA colonization surveillance program. It is not intended to diagnose MRSA infection nor to guide or monitor treatment for MRSA infections.   Culture, respiratory (NON-Expectorated)     Status: None (Preliminary result)   Collection Time: 03/31/17 10:15 AM   Result Value Ref Range Status   Specimen Description TRACHEAL ASPIRATE  Final   Special Requests NONE  Final   Gram Stain   Final    ABUNDANT WBC PRESENT, PREDOMINANTLY PMN ABUNDANT GRAM POSITIVE COCCI IN PAIRS IN CHAINS RARE GRAM POSITIVE RODS RARE GRAM NEGATIVE COCCI IN PAIRS Performed at [facility], [place] [address] [place], [place] [address]    Culture PENDING  Incomplete   Report Status PENDING  Incomplete  CSF culture     Status: None (Preliminary result)   Collection Time: 03/31/17  2:45 PM  Result Value Ref Range Status   Specimen Description LUMBAR  Final   Special Requests Normal  Final   Gram Stain NO ORGANISMS SEEN RARE WBC NO RBC   Final   Culture PENDING  Incomplete   Report Status PENDING  Incomplete    Coagulation Studies:  Recent Labs  03/31/17 1114  LABPROT 14.5  INR 1.12    Imaging: Dg Chest 1 View  Result Date: 03/30/2017 CLINICAL DATA:  Central line placement. EXAM: CHEST 1 VIEW COMPARISON:  Radiograph same day. FINDINGS: The heart size and mediastinal contours are within normal limits. Both lungs are clear. No pneumothorax or pleural effusion is noted. Endotracheal and nasogastric tubes are unchanged in position. Interval placement of left internal jugular catheter, with distal tip in expected position of SVC. The visualized skeletal structures are unremarkable. IMPRESSION: Stable support apparatus. Interval placement of left internal jugular catheter, with distal tip in expected position of SVC. No acute cardiopulmonary abnormality seen. Electronically Signed   By: Marijo Conception, M.D.   On: 03/30/2017 23:19   Dg Abd 1 View  Result Date: 03/30/2017 CLINICAL DATA:  Orogastric tube placement. EXAM: ABDOMEN - 1 VIEW COMPARISON:  None. FINDINGS: The bowel gas pattern is normal. No radio-opaque calculi or other significant radiographic  abnormality are seen. Distal tip of orogastric tube is seen in expected position of proximal stomach. IMPRESSION:  No evidence of bowel obstruction or ileus. Distal tip of orogastric tube is seen in expected position of proximal stomach. Electronically Signed   By: Marijo Conception, M.D.   On: 03/30/2017 23:20   Ct Head Wo Contrast  Result Date: 03/31/2017 CLINICAL DATA:  Acute metabolic encephalopathy. Diabetic ketoacidosis. EXAM: CT HEAD WITHOUT CONTRAST TECHNIQUE: Contiguous axial images were obtained from the base of the skull through the vertex without intravenous contrast. COMPARISON:  CT head without contrast 03/30/2017. FINDINGS: Brain: A large right MCA territory nonhemorrhagic infarct is present. There is diffuse loss of gray-white differentiation the involving the right frontal, parietal, and much of the occipital lobe. There is some sparing of the anteromedial right frontal lobe. There is progressive mass effect with 12 mm midline shift. There is partial effacement of the para mesencephalic cistern. The quadrigeminal plate cistern is intact. The fourth ventricle is normal. A posterior watershed territory infarct is noted on the left. No other acute or focal left-sided infarcts are present. Vascular: A hyperdense right MCA is evident the against the backdrop of diffuse infarcted territory. No other hyperdense vessel is present. Skull: The calvarium is intact. No focal lytic or blastic lesions are present. Sinuses/Orbits: Nasopharyngeal fluid is likely related to intubation. There is mild mucosal thickening involving the maxillary left maxillary sinus and bilateral ethmoid sinuses. Minimal fluid is present in the sphenoid sinus. A remote medial orbital blowout fracture is noted. The visualized globes and orbits are otherwise unremarkable. IMPRESSION: 1. Progressive large right MCA territory infarct with involvement of the right occipital lobe likely related to a fetal type right posterior cerebral artery. 2. Diffuse edema and mass effect with 12 mm midline shift and partial effacement of the para mesencephalic  cisterns. 3. Developing left posterior watershed territory infarct. 4. Nasal fluid and bilateral sinus disease likely related to intubation. These results were called by telephone at the time of interpretation on 03/31/2017 at 9:13 pm to Dr. Harrel Lemon , who verbally acknowledged these results. Electronically Signed   By: San Morelle M.D.   On: 03/31/2017 21:15   Ct Head Wo Contrast  Result Date: 03/30/2017 CLINICAL DATA:  56 y/o  M; unresponsive to stimuli. EXAM: CT HEAD WITHOUT CONTRAST TECHNIQUE: Contiguous axial images were obtained from the base of the skull through the vertex without intravenous contrast. COMPARISON:  None. FINDINGS: Brain: No evidence of acute infarction, hemorrhage, hydrocephalus, extra-axial collection or mass lesion/mass effect. Vascular: No hyperdense vessel or unexpected calcification. Skull: Normal. Negative for fracture or focal lesion. Sinuses/Orbits: Underpneumatized frontal sinuses. Opacification rudimentary right frontal sinus. Chronic right lamina papyracea fracture. Normal aeration of mastoid air cells. Visualized orbits are normal. Other: None. IMPRESSION: No acute intracranial abnormality identified. Unremarkable CT of the brain. Electronically Signed   By: Kristine Garbe M.D.   On: 03/30/2017 21:33   Dg Chest Portable 1 View  Result Date: 03/30/2017 CLINICAL DATA:  Endotracheal tube placement EXAM: PORTABLE CHEST 1 VIEW COMPARISON:  None. FINDINGS: The heart size and mediastinal contours are within normal limits. The tip of an endotracheal tube is approximately 3.4 cm above the carina in satisfactory position. There also appears be gastric tube however the tip is not well visualized and occasional be followed to the level of the GE junction. External defibrillator paddles slightly limit assessment as well as EKG leads. Heart size is normal. No aortic aneurysm. No acute pneumonic consolidation. Both  lungs are clear. The visualized skeletal  structures are unremarkable. IMPRESSION: 1. Satisfactory endotracheal tube position at 3.4 cm above the carina. 2. There appears to be a gastric tube as well although the tip is not well visualized and can only be followed to the level of the GE junction. Electronically Signed   By: Ashley Royalty M.D.   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 Neurological exam reveals left hemiplegia, left hemisensory loss, right eye deviation, and left hemineglect 2. CT head reveals progressive large right MCA territory infarct with involvement of the right occipital lobe likely related to a fetal type right posterior cerebral artery. There is diffuse edema and mass effect with 12 mm right to left midline shift and partial effacement of the paramesencephalic cisterns. Also noted is a developing left posterior watershed territory infarct. 3. DKA  Recommendations: 1. Admitted to ICU with Neurology following.  2. Hypertonic saline protocol 3. Keep HOB elevated to 45 degrees 4. BP management. Currently hypotensive and on pressor. Recommended SBP goal is 120-140 given completed strokes and high risk of hemorrhagic transformation if the patient becomes hypertensive.  5. Hold off on antiplatelet medication for now given high risk for hemorrhagic transformation. 6. Not an endovascular candidate given completed stroke and high risk for massive hemorrhagic transformation with luxury perfusion 7. DVT prophylaxis with SCDs 8. Neurosurgery has been consulted with plans for hemicraniectomy this AM. 9. Repeat CT head at 10 AM or after hemicraniectomy, whichever is first  A total of 40 minutes was spent in the emergent Neurological evaluation and management of this critically ill patient, including bedside examination, imaging review and coordination of care  Electronically signed: Dr. Kerney Elbe 04/01/2017, 6:06  AM

## 2017-04-01 NOTE — Progress Notes (Signed)
Arterial line placement attempted x2 by Berneice Gandy, RRT  and x1 by myself, unsuccessfully.  Patient dopplered positive for collateral circulation prior to placement.  MD Purcell Nails notified, at bedside.  No complicaions noted, patient tolerated well.

## 2017-04-01 NOTE — Anesthesia Postprocedure Evaluation (Signed)
Anesthesia Post Note  Patient: Joe Wilkinson  Procedure(s) Performed: Procedure(s) (LRB): RIGHT DECOMPRESSIVE CRANIECTOMY (Right)     Patient location during evaluation: SICU Anesthesia Type: General Level of consciousness: sedated Pain management: pain level controlled Vital Signs Assessment: post-procedure vital signs reviewed and stable Respiratory status: patient remains intubated per anesthesia plan Cardiovascular status: stable Anesthetic complications: no    Last Vitals:  Vitals:   04/01/17 0730 04/01/17 0750  BP: 128/65   Pulse: 66   Resp: 18   SpO2: 99% 99%    Last Pain: There were no vitals filed for this visit.               [place]

## 2017-04-01 NOTE — Progress Notes (Signed)
Patient left unit for procedure before nurse could perform full body assessment. Patient chemically paralyzed from procedure during 1000 assessment. Will reassess when paralytic wears off.

## 2017-04-01 NOTE — Transfer of Care (Signed)
Immediate Anesthesia Transfer of Care Note  Patient: Joe Wilkinson  Procedure(s) Performed: Procedure(s): RIGHT DECOMPRESSIVE CRANIECTOMY (Right)  Patient Location: SICU  Anesthesia Type:General  Level of Consciousness: Patient remains intubated per anesthesia plan  Airway & Oxygen Therapy: Patient remains intubated per anesthesia plan and Patient placed on Ventilator (see vital sign flow sheet for setting)  Post-op Assessment: Report given to RN and Post -op Vital signs reviewed and stable  Post vital signs: Reviewed and stable  Last Vitals:  Vitals:   04/01/17 0730 04/01/17 0750  BP: 128/65   Pulse: 66   Resp: 18   SpO2: 99% 99%    Last Pain: There were no vitals filed for this visit.       Complications: No apparent anesthesia complications

## 2017-04-01 NOTE — Op Note (Signed)
03/31/2017 - 04/01/2017  9:25 AM  PATIENT:  Joe Wilkinson  56 y.o. male  PRE-OPERATIVE DIAGNOSIS:  Malignant right MCA infarction  POST-OPERATIVE DIAGNOSIS:  Same  PROCEDURE:  Decompressive right frontotemporoparietal craniectomy for decompression with duraplasty  SURGEON:  Aldean Ast, MD  ASSISTANTS: Ferne Reus, PA-C  ANESTHESIA:   General  DRAINS: None   SPECIMEN:  None  INDICATION FOR PROCEDURE: 56 year old male with large right MCA infarct and mass effect.  Efforts were made to contact the family but they could not be reached.  The decision was made in conjunction with Neurology to take him to the OR for decompressive craniectomy.  PROCEDURE DETAILS: After smooth induction of general endotracheal anesthesia the patient was fixated in [place] pins with his head turned to the left and a shoulder roll under his right shoulder.  The right frontotemporoparietal area was prepped and draped in the usual sterile fashion. The area of the planned incision was infiltrated with lidocaine and Marcaine with epinephrine.  A reverse question mark shaped incision was made from the right frontal area over the parietal area and terminating at the level of the zygomatic arch 1 cm in front of the tragus. The temporalis muscle was sharply incised. Using periosteal elevators a musculocutaneous flap was elevated and advanced anteriorly. Hemostasis was obtained. Bur holes were drilled using a perforator drill bit. The dura was separated from the inner table of the skull at the burr hole's. A large craniotomy was then performed. The bone flap was separated from the dura and removed from the surgical field and discarded. The dura was sharply incised in a cruciate manner. The brain herniated through the dural defect. 2 pieces of DuraMatrix dural substitute were loosely sewn to the cut dural edges with interrupted Nurolon suture.  The wound was irrigated with bacitracin saline. There was excellent  hemostasis. The wound was closed in routine anatomic layers with interrupted Vicryl sutures. The skin was closed with staples. A sterile dressing was applied. The patient was taken out of [place] pins. He will remain intubated.    PATIENT DISPOSITION:  ICU - intubated and hemodynamically stable.   Delay start of Pharmacological VTE agent (>24hrs) due to surgical blood loss or risk of bleeding:  yes

## 2017-04-01 NOTE — Progress Notes (Signed)
RT note-Patient taken to the OR by anesthesia. 

## 2017-04-01 NOTE — Progress Notes (Signed)
1100 golf ball sized lump found on right anterior side of the patients neck. MD Sood notified, no further orders at this time.

## 2017-04-01 NOTE — Progress Notes (Signed)
[facility] Progress Note Patient Name: Joe Wilkinson DOB: [birth year] MRN: [id number]   Date of Service  04/01/2017  HPI/Events of Note  hypophos and moderately low potassium  eICU Interventions  Phos and potassium replaced     Intervention Category Intermediate Interventions: Electrolyte abnormality - evaluation and management  Yuridia Couts 04/01/2017, 4:03 AM

## 2017-04-01 NOTE — Progress Notes (Signed)
Pharmacy Antibiotic Note  Joe Wilkinson is a 56 y.o. male admitted on 03/31/2017 with DKA and concern for sepsis.  Pharmacy has been consulted for vancomycin and zosyn. Of note, patient found to have CVA and has tentative plan for surgical intervention in AM per notes.   Tmax 100.9 and WBC elevated at 27.1. Renal function is normal for estimated nCrCl ~ 85 mL/min.   Patient received two doses of vancomycin from OSH: 1g x1 on 8/24 @ 1125 and 1.25g x1 on 8/24 @ 1745. Total vancomycin 2.25g is sufficient for load. Patient also received ceftriaxone x1 and ampicillin x3 for r/o meningitis on 8/24 prior to transfer to [facility].   Plan: Vancomycin 750 IV q12hr  Zosyn 3.375g IV q8hr  Vancomycin trough at SS and as needed (15-20 mcg/mL) Monitor renal function clinical picture, and culture data F/u length of therapy   Temp (24hrs), Avg:99.6 F (37.6 C), Min:97.1 F (36.2 C), Max:100.9 F (38.3 C)   Recent Labs Lab 03/30/17 1808 03/30/17 2248 03/31/17 0151 03/31/17 0546 03/31/17 0945 03/31/17 1304  WBC 24.0*  --  27.1*  --   --   --   CREATININE 2.54* 1.68* 1.42* 1.21 1.05 0.86    Estimated Creatinine Clearance: 122.4 mL/min (by C-G formula based on SCr of 0.86 mg/dL).    No Known Allergies  Abx:  8/24 Ampicillin > 8/24 8/24 CTX x1 8/24 Vancomycin >>  8/25 Zosyn >>   Microbiology:  8/23 MRSA PCR - neg 8/24 resp cx 8/24 blood cx  8/24 CSF cx   Argie Ramming, PharmD Clinical Pharmacist 04/01/17 2:22 AM

## 2017-04-01 NOTE — Progress Notes (Signed)
RN assisted with pt trip to OR. Pt. Transferred successfully with OR RN and CRNA.

## 2017-04-01 NOTE — Progress Notes (Signed)
Neurosurgery aware of pt. Sodium level results. RN will continue 3% at rate of 50 ml/hr. RN will continue to monitor.

## 2017-04-02 ENCOUNTER — Inpatient Hospital Stay (HOSPITAL_COMMUNITY): Payer: Medicaid Other

## 2017-04-02 DIAGNOSIS — J988 Other specified respiratory disorders: Secondary | ICD-10-CM

## 2017-04-02 DIAGNOSIS — A419 Sepsis, unspecified organism: Secondary | ICD-10-CM

## 2017-04-02 DIAGNOSIS — R6521 Severe sepsis with septic shock: Secondary | ICD-10-CM

## 2017-04-02 LAB — BASIC METABOLIC PANEL
Anion gap: 7 (ref 5–15)
BUN: 17 mg/dL (ref 6–20)
CO2: 23 mmol/L (ref 22–32)
Calcium: 7.9 mg/dL — ABNORMAL LOW (ref 8.9–10.3)
Chloride: 124 mmol/L — ABNORMAL HIGH (ref 101–111)
Creatinine, Ser: 1.05 mg/dL (ref 0.61–1.24)
GFR calc Af Amer: >60 mL/min (ref >60)
GFR calc non Af Amer: >60 mL/min (ref >60)
Glucose, Bld: 227 mg/dL — ABNORMAL HIGH (ref 65–99)
Potassium: 3.8 mmol/L (ref 3.5–5.1)
Sodium: 154 mmol/L — ABNORMAL HIGH (ref 135–145)

## 2017-04-02 LAB — HERPES SIMPLEX VIRUS(HSV) DNA BY PCR
HSV 1 DNA: NEGATIVE
HSV 2 DNA: NEGATIVE

## 2017-04-02 LAB — URINE CULTURE: CULTURE: NO GROWTH

## 2017-04-02 LAB — HEMOGLOBIN A1C
Hgb A1c MFr Bld: 14.3 % — ABNORMAL HIGH (ref 4.8–5.6)
MEAN PLASMA GLUCOSE: 363.71 mg/dL

## 2017-04-02 LAB — MAGNESIUM
MAGNESIUM: 2.2 mg/dL (ref 1.7–2.4)
Magnesium: 2 mg/dL (ref 1.7–2.4)

## 2017-04-02 LAB — CBC
HEMATOCRIT: 21.9 % — AB (ref 39.0–52.0)
HEMOGLOBIN: 7 g/dL — AB (ref 13.0–17.0)
MCH: 26.2 pg (ref 26.0–34.0)
MCHC: 32 g/dL (ref 30.0–36.0)
MCV: 82 fL (ref 78.0–100.0)
Platelets: 104 10*3/uL — ABNORMAL LOW (ref 150–400)
RBC: 2.67 MIL/uL — ABNORMAL LOW (ref 4.22–5.81)
RDW: 17.2 % — ABNORMAL HIGH (ref 11.5–15.5)
WBC: 18.8 10*3/uL — AB (ref 4.0–10.5)

## 2017-04-02 LAB — BLOOD GAS, VENOUS
BICARBONATE: UNDETERMINED mmol/L (ref 20.0–28.0)
FIO2: 0.21
O2 Saturation: UNDETERMINED %
PCO2 VEN: 19 mmHg — AB (ref 44.0–60.0)
PO2 VEN: 50 mmHg — AB (ref 32.0–45.0)
Patient temperature: 37
pH, Ven: 6.94 — CL (ref 7.250–7.430)

## 2017-04-02 LAB — GLUCOSE, CAPILLARY
GLUCOSE-CAPILLARY: 238 mg/dL — AB (ref 65–99)
GLUCOSE-CAPILLARY: 258 mg/dL — AB (ref 65–99)
Glucose-Capillary: 237 mg/dL — ABNORMAL HIGH (ref 65–99)
Glucose-Capillary: 263 mg/dL — ABNORMAL HIGH (ref 65–99)
Glucose-Capillary: 264 mg/dL — ABNORMAL HIGH (ref 65–99)
Glucose-Capillary: 267 mg/dL — ABNORMAL HIGH (ref 65–99)

## 2017-04-02 LAB — LIPID PANEL
CHOL/HDL RATIO: 3.3 ratio
CHOLESTEROL: 93 mg/dL (ref 0–200)
HDL: 28 mg/dL — AB (ref >40)
LDL Cholesterol: 38 mg/dL (ref 0–99)
Triglycerides: 135 mg/dL (ref <150)
VLDL: 27 mg/dL (ref 0–40)

## 2017-04-02 LAB — CULTURE, RESPIRATORY W GRAM STAIN

## 2017-04-02 LAB — SODIUM
SODIUM: 154 mmol/L — AB (ref 135–145)
Sodium: 155 mmol/L — ABNORMAL HIGH (ref 135–145)

## 2017-04-02 LAB — CULTURE, RESPIRATORY

## 2017-04-02 LAB — PHOSPHORUS
PHOSPHORUS: 1.9 mg/dL — AB (ref 2.5–4.6)
Phosphorus: 1.8 mg/dL — ABNORMAL LOW (ref 2.5–4.6)

## 2017-04-02 MED ORDER — INSULIN ASPART 100 UNIT/ML ~~LOC~~ SOLN
5.0000 [IU] | Freq: Once | SUBCUTANEOUS | Status: AC
  Administered 2017-04-02: 5 [IU] via SUBCUTANEOUS

## 2017-04-02 MED ORDER — INSULIN DETEMIR 100 UNIT/ML ~~LOC~~ SOLN
30.0000 [IU] | Freq: Every day | SUBCUTANEOUS | Status: DC
  Administered 2017-04-03 – 2017-04-08 (×6): 30 [IU] via SUBCUTANEOUS
  Filled 2017-04-02 (×7): qty 0.3

## 2017-04-02 MED ORDER — INSULIN DETEMIR 100 UNIT/ML ~~LOC~~ SOLN
20.0000 [IU] | Freq: Every day | SUBCUTANEOUS | Status: DC
  Administered 2017-04-02: 20 [IU] via SUBCUTANEOUS
  Filled 2017-04-02: qty 0.2

## 2017-04-02 MED ORDER — ASPIRIN 325 MG PO TABS
325.0000 mg | ORAL_TABLET | Freq: Every day | ORAL | Status: DC
  Administered 2017-04-02 – 2017-04-06 (×5): 325 mg
  Filled 2017-04-02 (×5): qty 1

## 2017-04-02 MED ORDER — IOPAMIDOL (ISOVUE-370) INJECTION 76%
INTRAVENOUS | Status: AC
  Administered 2017-04-02: 50 mL
  Filled 2017-04-02: qty 50

## 2017-04-02 MED ORDER — CHLORHEXIDINE GLUCONATE CLOTH 2 % EX PADS
6.0000 | MEDICATED_PAD | Freq: Every morning | CUTANEOUS | Status: DC
  Administered 2017-04-02 – 2017-04-03 (×2): 6 via TOPICAL

## 2017-04-02 NOTE — Progress Notes (Signed)
STROKE TEAM PROGRESS NOTE   HISTORY OF PRESENT ILLNESS (per record) Joe Wilkinson is an 56 y.o. male who presented to [facility] in DKA. He was [place] lethargic [place] bus stop and then became unresponsive. EMS reported O2 sat of 80% and BP of 90/40. His CBG revealed hyperglycemia. On arrival to [facility] the patient patient could tell the examiner his name but was unable to provide any other information. He was restless, moaning and moving all extremities, but not following commands. Labs were consistent with DKA and he was started on insulin drip. Initial CT head at [facility] showed no acute abnormality. He developed Kussmaul breathing requiring intubation. 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 He was transported to [facility] for further care. He has developed hypotension requiring pressors.   SUBJECTIVE (INTERVAL HISTORY) The patient is sedated on fentanyl drip and remains intubated, BP controlled. On hypertonic saline. Repeat CT today shows large Rt MCA infarct with midline shift despite hemicraniectomy.CTA shows right M 1 occlusion. No family at the bedside. Patient is sedated and intubated   OBJECTIVE Temp:  [99.5 F (37.5 C)] 99.5 F (37.5 C) (08/26 0400) Pulse Rate:  [66-117] 101 (08/26 0600) Cardiac Rhythm: Other (Comment) (08/25 2000) Resp:  [16-22] 19 (08/26 0600) BP: (107-135)/(65-82) 135/74 (08/26 0600) SpO2:  [97 %-100 %] 99 % (08/26 0600) Arterial Line BP: (93-153)/(61-78) 148/71 (08/26 0500) FiO2 (%):  [40 %] 40 % (08/26 0310) Weight:  [207 lb 7.3 oz (94.1 kg)] 207 lb 7.3 oz (94.1 kg) (08/26 0438)  CBC:   Recent Labs Lab 04/01/17 0225 04/01/17 1124  WBC 22.2* 15.1*  NEUTROABS 15.1*  --   HGB 10.9* 8.7*  HCT 32.9* 26.5*  MCV 77.6* 79.1  PLT 123* 111*    Basic Metabolic Panel:   Recent Labs Lab  04/01/17 0225 04/01/17 1124 04/01/17 2037  NA 143 145 149*  K 3.5 4.4  --   CL 114* 117*  --   CO2 24 20*  --   GLUCOSE 100* 303*  --   BUN 14 10  --   CREATININE 0.90 0.93  --   CALCIUM 8.2* 7.4*  --   MG 2.0 1.7 2.0  PHOS <1.0* 3.0 1.6*    Lipid Panel: No results found for: CHOL, TRIG, HDL, CHOLHDL, VLDL, LDLCALC HgbA1c: No results found for: HGBA1C Urine Drug Screen: No results found for: LABOPIA, COCAINSCRNUR, LABBENZ, AMPHETMU, THCU, LABBARB  Alcohol Level No results found for: [facility]  IMAGING  Dg Chest 1 View 03/30/2017 Stable support apparatus. Interval placement of left internal jugular catheter, with distal tip in expected position of SVC. No acute cardiopulmonary abnormality seen.    Dg Abd 1 View 03/30/2017 No evidence of bowel obstruction or ileus. Distal tip of orogastric tube is seen in expected position of proximal stomach.    Ct Head Wo Contrast 03/31/2017 1. Progressive large right MCA territory infarct with involvement of the right occipital lobe likely related to a fetal type right posterior cerebral artery.  2. Diffuse edema and mass effect with 12 mm midline shift and partial effacement of the para mesencephalic cisterns.  3. Developing left posterior watershed territory infarct.  4. Nasal fluid and bilateral sinus disease likely related to intubation.     Ct Head Wo Contrast 03/30/2017 No acute intracranial abnormality identified. Unremarkable CT of the brain.  Dg Chest Portable 1 View 03/30/2017 1. Satisfactory endotracheal tube position at 3.4 cm above the carina.  2. There appears to be a gastric tube as well although the tip is not well visualized and can only be followed to the level of the GE junction.      PHYSICAL EXAM   Vitals:   04/02/17 0400 04/02/17 0438 04/02/17 0500 04/02/17 0600  BP: 117/71  121/80 135/74  Pulse: (!) 117  (!) 105 (!) 101  Resp: (!) 21  18 19   Temp: 99.5 F (37.5 C)     SpO2: 97%  99% 99%  Weight:  207  lb 7.3 oz (94.1 kg)    Height:       Middle-age African-American male who is sedated and intubated. Craniotomy bandage on the right skull  . Afebrile. Head is nontraumatic. Neck is supple without bruit.    Cardiac exam no murmur or gallop. Lungs are clear to auscultation. Distal pulses are well felt. Neurological Exam :  Sedated intubated.  Eyes are closed.but will open partially to stimulation. There is bilateral swelling of eyelids and conjunctiva. Right gaze deviation. Dolls eye movements are sluggish. Right pupil 3 mm not reactive left 4 mm not reactive. Fundi could not be visualized Right corneal reflexes present and left is sluggish. Gag reflex is absent but cough is present on suctioning. Patient is able to move right upper and lower extremity with brisk withdrawal response to pain. Dense left hemiplegia with decreased tone. Reflexes present on the right and absent on the left. Left plantar upgoing right equivocal. ASSESSMENT/PLAN Mr. Joe Wilkinson is a 56 y.o. male with history of diabetes mellitus (other past medical history unknown at this time) presenting with unresponsiveness. He did not receive IV t-PA due to decompressive right frontotemporoparietal craniectomy for decompression.  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 Cytotoxic edema with right-to-left brain herniation status post hemicraniectomy  Resultant  left hemiplegia and gaze paralysis  CT head - Progressive large right MCA territory infarct with involvement of the right occipital lobe likely related to a fetal type right posterior cerebral artery. Diffuse edema and mass effect with 12 mm midline shift and partial effacement of the para mesencephalic cisterns. Developing left posterior watershed territory infarct.   MRI head - not performed  MRA head - not performed  Carotid Doppler - not performed  2D Echo - not performed  LDL - will  order   HgbA1c - will order   VTE prophylaxis Diet NPO time specified  No antithrombotic prior to admission, now on No antithrombotic  Patient will be counseled to be compliant with his antithrombotic medications  Ongoing aggressive stroke risk factor management  Therapy recommendations:  pending  Disposition:  Pending  Hypertension  Stable  Permissive hypertension (OK if < 220/120) but gradually normalize in 5-7 days  Long-term BP goal normotensive  Hyperlipidemia  Home meds: No lipid lowering medications prior to admission  LDL pending, goal < 70    Diabetes  HgbA1c pending, goal < 7.0  Uncontrolled  Other Stroke Risk Factors  Obesity, Body mass index is 29.77 kg/m., recommend weight loss, diet and exercise as appropriate    Other Active Problems  Right frontotemporoparietal craniectomy for decompression this AM - Dr Cyndy Freeze.  Currently not on aspirin.  DKA  Induced hypernatremia on 3% hypertonic saline  Intubated and sedated  Pressors for hypotension  Tube feedings  Sepsis - vancomycin  and Zosyn started 04/01/2017  Hospital day # 1  I have personally examined this patient, reviewed notes, independently viewed imaging studies, participated in medical decision making and plan of care.ROS completed by me personally and pertinent positives fully documented  I have made any additions or clarifications directly to the above note.  Presented initially to an outside hospital with unresponsive state and diabetic ketoacidosis but overnight was noted to have left hemiplegia and repeat CT scan showed a large right middle cerebral artery infarct with malignant cerebral edema and midline shift and underwent emergent right hemicraniectomy. Recommend close neurological monitoring. Continue hypertonic saline was cytotoxic edema but sodium goal 150-155  Prognosis remains guarded. No family available at the bedside. Discussion. Check echocardiogram and lipid profile and  ongoing stroke evaluation. Management of sepsis and medical problems as per PCCM team. Discuss with Dr.Sood. This patient is critically ill and at significant risk of neurological worsening, death and care requires constant monitoring of vital signs, hemodynamics,respiratory and cardiac monitoring, extensive review of multiple databases, frequent neurological assessment, discussion with family, other specialists and medical decision making of high complexity.I have made any additions or clarifications directly to the above note.This critical care time does not reflect procedure time, or teaching time or supervisory time of PA/NP/Med Resident etc but could involve care discussion time.  I spent 53minutes of neurocritical care time  in the care of  this patient.      Antony Contras, MD Medical Director [facility] Stroke Center Pager: [phone] 04/02/2017 7:15 AM   To contact Stroke Continuity provider, please refer to [email or web address] After hours, contact General Neurology

## 2017-04-02 NOTE — Progress Notes (Signed)
PULMONARY / CRITICAL CARE MEDICINE   Name: Joe Wilkinson MRN: [id number] DOB: 1961-05-30    ADMISSION DATE:  03/31/2017 CHIEF COMPLAINT:  CVA  HISTORY OF PRESENT ILLNESS:   56yo M found unresponsive at [facility] admitted to [facility] in DKA, concern for sepsis w/o source, later developed hemiparesis and found to have acute Right MCA CVA.  Now transferred to [facility] ICU for NeuroSx care, seen by NSX and Neuro.  Tentative plan for crani in AM. He is intubated and sedated and unable to provide any HPI, ROS, PMH, PSH, FH, home meds, allergies.  All Hx form medical record.  SUBJECTIVE: Blood sugars high.  VITAL SIGNS: BP 135/74   Pulse (!) 101   Temp 99.5 F (37.5 C)   Resp 19   Ht 5\' 10"  (1.778 m)   Wt 207 lb 7.3 oz (94.1 kg)   SpO2 99%   BMI 29.77 kg/m   VENTILATOR SETTINGS: Vent Mode: PRVC FiO2 (%):  [40 %] 40 % Set Rate:  [18 bmp] 18 bmp Vt Set:  [550 mL] 550 mL PEEP:  [5 cmH20] 5 cmH20 Plateau Pressure:  [15 cmH20-17 cmH20] 17 cmH20  INTAKE / OUTPUT: I/O last 3 completed shifts: In: 3454.3 [I.V.:1723.9; NG/GT:845.3; IV Piggyback:885] Out: 2170 [Urine:1970; Blood:200]  PHYSICAL EXAMINATION:  General - on vent ENT - ETT in place Cardiac - regular, tachycardic Chest - no wheeze Abd - soft, non tender Ext - 1+ edmea Skin - no rashes Neuro - moving Rt side  BMET  Recent Labs Lab 03/31/17 1304 03/31/17 1708 04/01/17 0225 04/01/17 1124 04/01/17 2037  NA 138  --  143 145 149*  K 3.2* 4.2 3.5 4.4  --   CL 110  --  114* 117*  --   CO2 24  --  24 20*  --   BUN 25*  --  14 10  --   CREATININE 0.86  --  0.90 0.93  --   GLUCOSE 211*  --  100* 303*  --     Electrolytes  Recent Labs Lab 03/31/17 1304 04/01/17 0225 04/01/17 1124 04/01/17 2037  CALCIUM 7.9* 8.2* 7.4*  --   MG  --  2.0 1.7 2.0  PHOS  --  <1.0* 3.0 1.6*    CBC  Recent Labs Lab 03/31/17 0151 04/01/17 0225 04/01/17 1124  WBC 27.1* 22.2* 15.1*  HGB 12.6* 10.9* 8.7*  HCT 37.4* 32.9* 26.5*  PLT  134* 123* 111*    Coag's  Recent Labs Lab 03/31/17 1114 04/01/17 0225  APTT 33 31  INR 1.12  --     Sepsis Markers  Recent Labs Lab 03/31/17 1114 04/01/17 0225  PROCALCITON 4.28 2.53    ABG  Recent Labs Lab 03/30/17 2321 03/31/17 0542 04/01/17 0250  PHART 7.21* 7.44 7.483*  PCO2ART 21* 36 28.3*  PO2ART 154* 148* 136*    Liver Enzymes  Recent Labs Lab 03/30/17 1808 04/01/17 0225  AST 23 40  ALT 14* 13*  ALKPHOS 94 58  BILITOT 2.3* 1.7*  ALBUMIN 3.3* 2.2*    Cardiac Enzymes  Recent Labs Lab 03/30/17 1808  TROPONINI <0.03    Glucose  Recent Labs Lab 04/01/17 0708 04/01/17 1216 04/01/17 1556 04/01/17 2007 04/01/17 2337 04/02/17 0410  GLUCAP 139* 323* 246* 250* 237* 264*    Imaging Ct Angio Head W Or Wo Contrast  Result Date: 04/02/2017 CLINICAL DATA:  Stroke follow-up EXAM: CT ANGIOGRAPHY HEAD AND NECK TECHNIQUE: Multidetector CT imaging of the head and neck was performed using  the standard protocol during bolus administration of intravenous contrast. Multiplanar CT image reconstructions and MIPs were obtained to evaluate the vascular anatomy. Carotid stenosis measurements (when applicable) are obtained utilizing NASCET criteria, using the distal internal carotid diameter as the denominator. CONTRAST:  50 ML Isovue 370 COMPARISON:  Head CT 03/31/2017 FINDINGS: CT HEAD FINDINGS Brain: Extensive cytotoxic edema throughout the entire right MCA distribution. The patient has undergone decompressive right pterional craniectomy. There is persistent leftward midline shift that measures 8 mm at the level of the foramina Monro. There is no acute hemorrhage. Mass effect on the right lateral ventricle is unchanged. No hydrocephalus. Basal cisterns are patent. No tonsillar herniation. Vascular: Density throughout the right middle cerebral artery. Skull: Decompressive right pterional craniectomy. Sinuses/Orbits: Partial opacification of all paranasal sinuses.  Normal orbits. CTA NECK FINDINGS Aortic arch: There is no aneurysm or dissection of the visualized ascending aorta or aortic arch. Normal 3 vessel aortic branching pattern. The visualized proximal subclavian arteries are normal. Right carotid system: The right common carotid origin is widely patent. There is no common carotid or internal carotid artery dissection or aneurysm. No hemodynamically significant stenosis. Left carotid system: The left common carotid origin is widely patent. There is no common carotid or internal carotid artery dissection or aneurysm. No hemodynamically significant stenosis. Vertebral arteries: The vertebral system is codominant. Both vertebral artery origins are normal. Both vertebral arteries are normal to their confluence with the basilar artery. Skeleton: There is no bony spinal canal stenosis. No lytic or blastic lesions. Other neck: Large multinodular goiter. Upper chest: No acute abnormality. Review of the MIP images confirms the above findings CTA HEAD FINDINGS Anterior circulation: --Intracranial internal carotid arteries: Normal. --Anterior cerebral arteries: Normal. --Middle cerebral arteries: The right middle cerebral artery is occluded at the proximal M1 segment. Normal left MCA. There is minimal collateral vessel enhancement within the infarcted right MCA territory. --Posterior communicating arteries: Present bilaterally. Posterior circulation: --Posterior cerebral arteries: Normal. --Superior cerebellar arteries: Normal. --Basilar artery: Diminutive, likely secondary to presence of medium caliber floor bilateral posterior communicating arteries. --Anterior inferior cerebellar arteries: Not clearly visualized. --Posterior inferior cerebellar arteries: Normal. Venous sinuses: As permitted by contrast timing, patent. Anatomic variants: None Delayed phase: No parenchymal contrast enhancement. Review of the MIP images confirms the above findings IMPRESSION: 1. Occlusion of the  right middle cerebral artery proximal M1 segment with extensive cytotoxic edema throughout the entire right MCA distribution. 2. Status post right pterional craniectomy for decompression with unchanged leftward midline shift measuring 8 mm. 3. No hemorrhage. 4. Normal carotid arteries.  No embolic source identified. Electronically Signed   By: Ulyses Jarred M.D.   On: 04/02/2017 06:13   Dg Abd 1 View  Result Date: 04/01/2017 CLINICAL DATA:  Check gastric catheter placement EXAM: ABDOMEN - 1 VIEW COMPARISON:  Film from earlier in the same day FINDINGS: Scattered large and small bowel gas is again identified. Nasogastric catheter is now noted within the stomach. The previously seen cheek has been reduced. IMPRESSION: Gastric catheter within the stomach. Electronically Signed   By: Inez Catalina M.D.   On: 04/01/2017 17:11   Ct Angio Neck W Or Wo Contrast  Result Date: 04/02/2017 CLINICAL DATA:  Stroke follow-up EXAM: CT ANGIOGRAPHY HEAD AND NECK TECHNIQUE: Multidetector CT imaging of the head and neck was performed using the standard protocol during bolus administration of intravenous contrast. Multiplanar CT image reconstructions and MIPs were obtained to evaluate the vascular anatomy. Carotid stenosis measurements (when applicable) are obtained utilizing NASCET  criteria, using the distal internal carotid diameter as the denominator. CONTRAST:  50 ML Isovue 370 COMPARISON:  Head CT 03/31/2017 FINDINGS: CT HEAD FINDINGS Brain: Extensive cytotoxic edema throughout the entire right MCA distribution. The patient has undergone decompressive right pterional craniectomy. There is persistent leftward midline shift that measures 8 mm at the level of the foramina Monro. There is no acute hemorrhage. Mass effect on the right lateral ventricle is unchanged. No hydrocephalus. Basal cisterns are patent. No tonsillar herniation. Vascular: Density throughout the right middle cerebral artery. Skull: Decompressive right  pterional craniectomy. Sinuses/Orbits: Partial opacification of all paranasal sinuses. Normal orbits. CTA NECK FINDINGS Aortic arch: There is no aneurysm or dissection of the visualized ascending aorta or aortic arch. Normal 3 vessel aortic branching pattern. The visualized proximal subclavian arteries are normal. Right carotid system: The right common carotid origin is widely patent. There is no common carotid or internal carotid artery dissection or aneurysm. No hemodynamically significant stenosis. Left carotid system: The left common carotid origin is widely patent. There is no common carotid or internal carotid artery dissection or aneurysm. No hemodynamically significant stenosis. Vertebral arteries: The vertebral system is codominant. Both vertebral artery origins are normal. Both vertebral arteries are normal to their confluence with the basilar artery. Skeleton: There is no bony spinal canal stenosis. No lytic or blastic lesions. Other neck: Large multinodular goiter. Upper chest: No acute abnormality. Review of the MIP images confirms the above findings CTA HEAD FINDINGS Anterior circulation: --Intracranial internal carotid arteries: Normal. --Anterior cerebral arteries: Normal. --Middle cerebral arteries: The right middle cerebral artery is occluded at the proximal M1 segment. Normal left MCA. There is minimal collateral vessel enhancement within the infarcted right MCA territory. --Posterior communicating arteries: Present bilaterally. Posterior circulation: --Posterior cerebral arteries: Normal. --Superior cerebellar arteries: Normal. --Basilar artery: Diminutive, likely secondary to presence of medium caliber floor bilateral posterior communicating arteries. --Anterior inferior cerebellar arteries: Not clearly visualized. --Posterior inferior cerebellar arteries: Normal. Venous sinuses: As permitted by contrast timing, patent. Anatomic variants: None Delayed phase: No parenchymal contrast enhancement.  Review of the MIP images confirms the above findings IMPRESSION: 1. Occlusion of the right middle cerebral artery proximal M1 segment with extensive cytotoxic edema throughout the entire right MCA distribution. 2. Status post right pterional craniectomy for decompression with unchanged leftward midline shift measuring 8 mm. 3. No hemorrhage. 4. Normal carotid arteries.  No embolic source identified. Electronically Signed   By: Ulyses Jarred M.D.   On: 04/02/2017 06:13   Dg Abd Portable 1v  Result Date: 04/01/2017 CLINICAL DATA:  Encounter for orogastric tube placement. EXAM: PORTABLE ABDOMEN - 1 VIEW COMPARISON:  03/30/2017 FINDINGS: An orogastric tube is seen coiled at the GE junction with tip projecting back up into the esophagus. The tip is excluded on this study. The bowel gas pattern is normal. Mild dextroconvex curvature of the lumbar spine is noted. No radio-opaque calculi or other significant radiographic abnormality are seen. The tip of what appears to be a bladder probe is noted in the right hemipelvis. IMPRESSION: Gastric tube is seen coiled at the GE junction with tip projecting back up into the esophagus. The tip is excluded however on the study. Electronically Signed   By: Ashley Royalty M.D.   On: 04/01/2017 16:59     STUDIES:  LP 8/24 >> glucose 205, RBC 16, protein 62, WBC 5 CTH 8/24 >> Large Rt MCA  CULTURES: CSF 8/24>> BCX 8/24>> UCX 8/24>> Sputum 8/24>>  ANTIBIOTICS: vanco 8/24 >>  Zosyn  8/24 >>   SIGNIFICANT EVENTS: 8/23 Admit [facility] 8/23 8/24 Transfer to [facility] after Rt MCA CVA 8/25 Decompressive craniectomy  LINES/TUBES: ETT 8/23 >> Lt IJ CVL 8/23 >>  ASSESSMENT / PLAN:  PULMONARY A: Compromised airway. P:   Full vent support  CARDIOVASCULAR A:  Septic shock. P:  Goal MAP > 65, SBP > 120 per neurology  RENAL A:   Medically induced hypernatremia. Hypophosphatemia. P:   Goal Na 150 to 155 per neuro  GASTROINTESTINAL A:   Nutrition. P:   Tube  feeds  HEMATOLOGIC A:   Anemia of critical illness. Thrombocytopenia from sepsis. P:  F/u CBC  INFECTIOUS A:   Sepsis >> source unclear. P:   Day 3 of Abx F/u cultures from [facility]  ENDOCRINE A:   DKA - resolved. DM type I. P:   SSI Increase levemir to 20 units daily   NEUROLOGIC A:   Large right MCA CVA. P:   Per neurology and neurosurgery  D/w Dr. Leonie Man  CC time 72 minutes  Chesley Mires, MD [place] 04/02/2017, 7:36 AM Pager:  [phone] After 3pm call: [phone]

## 2017-04-02 NOTE — Progress Notes (Signed)
Patient seen and examined because of concern for facial twitching. Patient's nurse reported that patient had about 30 seconds to 1 minute episode of right facial twitching and some twitching of his right neck muscles when they were trying to palpate the neck mass that he has-likely a goiter. It subsided spontaneously and recurred again lasting for again 30 seconds. The patient has a large right MCA stroke and a small stroke on the left cerebral hemisphere as well. His exam is unchanged from that documented prior and Dr. Clydene Fake note.  Recommendations -Obtain EEG in the morning -no antiepileptics for now. -Continue frequent neuro checks -Please call neurology with any updates. Stroke team will follow with you in the morning.  Amie Portland, MD [facility] [phone]  If 7pm to 7am, please call on call as listed on AMION.  No charge note

## 2017-04-02 NOTE — Progress Notes (Signed)
This note also relates to the following rows which could not be included: SpO2 - Cannot attach notes to unvalidated device data  Per Dr Halford Chessman, no wean today.

## 2017-04-02 NOTE — Progress Notes (Signed)
No acute events Follows commands on right Bandage c/d/i CT stable No active neurosurgical issues, will sign off Staples may be removed in 10-14 days Follow up in 3-4 weeks for wound check Cranioplasty at 3-6 months

## 2017-04-02 NOTE — Progress Notes (Signed)
[facility] Progress Note Patient Name: Joe Wilkinson DOB: Oct 30, 1960 MRN: [id number]   Date of Service  04/02/2017  HPI/Events of Note  Blood glucose =  238 --> 263 --> 267 --> 258. Patient in currently on Lantus 20 units at 10 AM + Q 4 hour resistant Novolog SSI.  eICU Interventions  Will order: 1. Please give an extra dose of Novolog 5 units now (total dose = 16 units). 2. Increase 10 AM Lantus dose to 30 units.      Intervention Category Major Interventions: Hyperglycemia - active titration of insulin therapy  Sommer,Steven Cornelia Copa 04/02/2017, 9:11 PM

## 2017-04-03 ENCOUNTER — Inpatient Hospital Stay (HOSPITAL_COMMUNITY): Payer: Medicaid Other

## 2017-04-03 ENCOUNTER — Encounter (HOSPITAL_COMMUNITY): Payer: Self-pay | Admitting: Neurological Surgery

## 2017-04-03 DIAGNOSIS — I6789 Other cerebrovascular disease: Secondary | ICD-10-CM

## 2017-04-03 DIAGNOSIS — J9690 Respiratory failure, unspecified, unspecified whether with hypoxia or hypercapnia: Secondary | ICD-10-CM

## 2017-04-03 DIAGNOSIS — I639 Cerebral infarction, unspecified: Secondary | ICD-10-CM

## 2017-04-03 DIAGNOSIS — J9601 Acute respiratory failure with hypoxia: Secondary | ICD-10-CM

## 2017-04-03 DIAGNOSIS — Z9889 Other specified postprocedural states: Secondary | ICD-10-CM

## 2017-04-03 DIAGNOSIS — E1111 Type 2 diabetes mellitus with ketoacidosis with coma: Secondary | ICD-10-CM

## 2017-04-03 DIAGNOSIS — I63311 Cerebral infarction due to thrombosis of right middle cerebral artery: Secondary | ICD-10-CM

## 2017-04-03 DIAGNOSIS — R739 Hyperglycemia, unspecified: Secondary | ICD-10-CM

## 2017-04-03 LAB — BASIC METABOLIC PANEL
Anion gap: 5 (ref 5–15)
BUN: 17 mg/dL (ref 6–20)
CHLORIDE: 130 mmol/L — AB (ref 101–111)
CO2: 24 mmol/L (ref 22–32)
CREATININE: 1.04 mg/dL (ref 0.61–1.24)
Calcium: 8.4 mg/dL — ABNORMAL LOW (ref 8.9–10.3)
GFR calc non Af Amer: >60 mL/min (ref >60)
Glucose, Bld: 242 mg/dL — ABNORMAL HIGH (ref 65–99)
POTASSIUM: 4.3 mmol/L (ref 3.5–5.1)
SODIUM: 159 mmol/L — AB (ref 135–145)

## 2017-04-03 LAB — CSF CULTURE W GRAM STAIN
Culture: NO GROWTH
Special Requests: NORMAL

## 2017-04-03 LAB — SODIUM
SODIUM: 158 mmol/L — AB (ref 135–145)
Sodium: 157 mmol/L — ABNORMAL HIGH (ref 135–145)
Sodium: 158 mmol/L — ABNORMAL HIGH (ref 135–145)

## 2017-04-03 LAB — POCT I-STAT 4, (NA,K, GLUC, HGB,HCT)
GLUCOSE: 247 mg/dL — AB (ref 65–99)
HCT: 24 % — ABNORMAL LOW (ref 39.0–52.0)
HEMOGLOBIN: 8.2 g/dL — AB (ref 13.0–17.0)
POTASSIUM: 3.8 mmol/L (ref 3.5–5.1)
Sodium: 147 mmol/L — ABNORMAL HIGH (ref 135–145)

## 2017-04-03 LAB — GLUCOSE, CAPILLARY
GLUCOSE-CAPILLARY: 210 mg/dL — AB (ref 65–99)
GLUCOSE-CAPILLARY: 205 mg/dL — AB (ref 65–99)
GLUCOSE-CAPILLARY: 238 mg/dL — AB (ref 65–99)
Glucose-Capillary: 228 mg/dL — ABNORMAL HIGH (ref 65–99)
Glucose-Capillary: 234 mg/dL — ABNORMAL HIGH (ref 65–99)
Glucose-Capillary: 239 mg/dL — ABNORMAL HIGH (ref 65–99)
Glucose-Capillary: 242 mg/dL — ABNORMAL HIGH (ref 65–99)
Glucose-Capillary: 244 mg/dL — ABNORMAL HIGH (ref 65–99)

## 2017-04-03 LAB — POCT I-STAT 7, (LYTES, BLD GAS, ICA,H+H)
Acid-base deficit: 5 mmol/L — ABNORMAL HIGH (ref 0.0–2.0)
BICARBONATE: 19.5 mmol/L — AB (ref 20.0–28.0)
CALCIUM ION: 1.19 mmol/L (ref 1.15–1.40)
HCT: 26 % — ABNORMAL LOW (ref 39.0–52.0)
Hemoglobin: 8.8 g/dL — ABNORMAL LOW (ref 13.0–17.0)
O2 Saturation: 100 %
PH ART: 7.402 (ref 7.350–7.450)
PO2 ART: 353 mmHg — AB (ref 83.0–108.0)
Potassium: 3.8 mmol/L (ref 3.5–5.1)
SODIUM: 147 mmol/L — AB (ref 135–145)
TCO2: 20 mmol/L — AB (ref 22–32)
pCO2 arterial: 31.3 mmHg — ABNORMAL LOW (ref 32.0–48.0)

## 2017-04-03 LAB — PREPARE RBC (CROSSMATCH)

## 2017-04-03 LAB — CBC
HEMATOCRIT: 20.3 % — AB (ref 39.0–52.0)
Hemoglobin: 6.2 g/dL — CL (ref 13.0–17.0)
MCH: 25.8 pg — ABNORMAL LOW (ref 26.0–34.0)
MCHC: 30 g/dL (ref 30.0–36.0)
MCV: 86 fL (ref 78.0–100.0)
PLATELETS: 145 10*3/uL — AB (ref 150–400)
RBC: 2.36 MIL/uL — AB (ref 4.22–5.81)
RDW: 18.6 % — ABNORMAL HIGH (ref 11.5–15.5)
WBC: 26.2 10*3/uL — AB (ref 4.0–10.5)

## 2017-04-03 LAB — HEMOGLOBIN AND HEMATOCRIT, BLOOD
HEMATOCRIT: 24.5 % — AB (ref 39.0–52.0)
Hemoglobin: 7.5 g/dL — ABNORMAL LOW (ref 13.0–17.0)

## 2017-04-03 LAB — ECHOCARDIOGRAM COMPLETE
Height: 70 in
Weight: 3492.09 oz

## 2017-04-03 LAB — CSF CULTURE: GRAM STAIN: NONE SEEN

## 2017-04-03 MED ORDER — CHLORHEXIDINE GLUCONATE CLOTH 2 % EX PADS
6.0000 | MEDICATED_PAD | Freq: Every day | CUTANEOUS | Status: DC
  Administered 2017-04-04 – 2017-04-10 (×7): 6 via TOPICAL

## 2017-04-03 MED ORDER — SODIUM CHLORIDE 0.9 % IV SOLN
Freq: Once | INTRAVENOUS | Status: AC
  Administered 2017-04-03: 08:00:00 via INTRAVENOUS

## 2017-04-03 MED ORDER — SODIUM CHLORIDE 0.9% FLUSH
10.0000 mL | INTRAVENOUS | Status: DC | PRN
  Administered 2017-04-10: 10 mL
  Filled 2017-04-03: qty 40

## 2017-04-03 MED ORDER — SODIUM CHLORIDE 0.9 % IV SOLN
2.0000 g | Freq: Four times a day (QID) | INTRAVENOUS | Status: AC
  Administered 2017-04-03 – 2017-04-06 (×14): 2 g via INTRAVENOUS
  Filled 2017-04-03 (×14): qty 2000

## 2017-04-03 NOTE — Progress Notes (Signed)
STROKE TEAM PROGRESS NOTE   SUBJECTIVE (INTERVAL HISTORY) The patient remains intubated. EEG undergoing during rounds. Pt left eye open and right eye closed due to eyelid edema. Follows commands intermittently. Drowsy on sedation. Glucose still high. Hb was low at 6.2 and receiving PRBC. Not obvious bleeding source.  OBJECTIVE Temp:  [99 F (37.2 C)-101.1 F (38.4 C)] 99 F (37.2 C) (08/27 1200) Pulse Rate:  [96-133] 108 (08/27 1505) Cardiac Rhythm: Sinus tachycardia (08/27 0800) Resp:  [13-20] 18 (08/27 1505) BP: (97-173)/(56-86) 136/77 (08/27 1505) SpO2:  [96 %-100 %] 100 % (08/27 1505) Arterial Line BP: (117-168)/(57-117) 142/69 (08/27 1400) FiO2 (%):  [40 %] 40 % (08/27 1505) Weight:  [218 lb 4.1 oz (99 kg)] 218 lb 4.1 oz (99 kg) (08/27 0500)  CBC:   Recent Labs Lab 04/01/17 0225  04/02/17 0607 04/03/17 0554 04/03/17 1258  WBC 22.2*  < > 18.8* 26.2*  --   NEUTROABS 15.1*  --   --   --   --   HGB 10.9*  < > 7.0* 6.2* 7.5*  HCT 32.9*  < > 21.9* 20.3* 24.5*  MCV 77.6*  < > 82.0 86.0  --   PLT 123*  < > 104* 145*  --   < > = values in this interval not displayed.  Basic Metabolic Panel:   Recent Labs Lab 04/02/17 0607  04/02/17 1700  04/03/17 0554 04/03/17 1258  NA 154*  < >  --   < > 159* 158*  K 3.8  --   --   --  4.3  --   CL 124*  --   --   --  130*  --   CO2 23  --   --   --  24  --   GLUCOSE 227*  --   --   --  242*  --   BUN 17  --   --   --  17  --   CREATININE 1.05  --   --   --  1.04  --   CALCIUM 7.9*  --   --   --  8.4*  --   MG 2.0  --  2.2  --   --   --   PHOS 1.8*  --  1.9*  --   --   --   < > = values in this interval not displayed.  Lipid Panel:     Component Value Date/Time   CHOL 93 04/02/2017 0607   TRIG 135 04/02/2017 0607   HDL 28 (L) 04/02/2017 0607   CHOLHDL 3.3 04/02/2017 0607   VLDL 27 04/02/2017 0607   LDLCALC 38 04/02/2017 0607   HgbA1c:  Lab Results  Component Value Date   HGBA1C 14.3 (H) 04/02/2017   Urine Drug  Screen: No results found for: LABOPIA, COCAINSCRNUR, LABBENZ, AMPHETMU, THCU, LABBARB  Alcohol Level No results found for: [place] I have personally reviewed the radiological images below and agree with the radiology interpretations.  Ct Head Wo Contrast 03/31/2017 1. Progressive large right MCA territory infarct with involvement of the right occipital lobe likely related to a fetal type right posterior cerebral artery.  2. Diffuse edema and mass effect with 12 mm midline shift and partial effacement of the para mesencephalic cisterns.  3. Developing left posterior watershed territory infarct.  4. Nasal fluid and bilateral sinus disease likely related to intubation.   Ct Head Wo Contrast 03/30/2017 No acute intracranial abnormality identified. Unremarkable CT of the  brain.   Ct Angio Head and neck W Or Wo Contrast 04/02/2017 IMPRESSION: 1. Occlusion of the right middle cerebral artery proximal M1 segment with extensive cytotoxic edema throughout the entire right MCA distribution. 2. Status post right pterional craniectomy for decompression with unchanged leftward midline shift measuring 8 mm. 3. No hemorrhage. 4. Normal carotid arteries.  No embolic source identified.   LE venous doppler - No evidence of deep vein thrombosis or baker's cysts bilaterally.  EEG - This EEG is abnormal due to bilateral slowing with suppression of the right hemisphere. Clinical Correlation of the above findings indicates bilateral cerebral dysfunction that is non-specific in etiology and can be seen with hypoxic/ischemic injury, toxic/metabolic encephalopathies, neurodegenerative disorders, or medication effect.  The absence of epileptiform discharges does not rule out a clinical diagnosis of epilepsy.  Clinical correlation is advised.  TTE pending   PHYSICAL EXAM   Vitals:   04/03/17 1300 04/03/17 1400 04/03/17 1500 04/03/17 1505  BP: 127/84 129/80 136/77 136/77  Pulse: (!) 107 (!) 111 (!) 106 (!) 108   Resp: 16 15 14 18   Temp:      TempSrc:      SpO2: 99% 97% 98% 100%  Weight:      Height:       Middle-age African-American male who is sedated and intubated. Craniotomy bandage on the right skull. Afebrile. Head is nontraumatic. Cardiac exam no murmur or gallop. Distal pulses are well felt. Neurological Exam :  Sedated and intubated.  Right eye closed with eyelid edema, left eye open partially to stimulation. Follows simple commands on the right UE and LE. Right gaze deviation, not cross midline. Dolls eye movements are sluggish. Pupils 3 mm sluggish to light. Fundi could not be visualized. Right corneal reflexes present and left is sluggish. Gag and cough are present on suctioning. Patient is able to move right upper and lower extremity spontaneously and against gravity with brisk withdrawal response to pain. Dense left hemiplegia with decreased tone. Reflexes present on the right and absent on the left. Left plantar upgoing right equivocal.   ASSESSMENT/PLAN Mr. Joe Wilkinson is a 56 y.o. male with history of diabetes mellitus (other past medical history unknown at this time) presenting with unresponsiveness. He did not receive IV t-PA due to decompressive right frontotemporoparietal craniectomy for decompression.  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 S/p right decompressive hemicraniectomy.   Resultant  left hemiplegia and right gaze   CT head - Progressive large right MCA territory infarct with involvement of the right occipital lobe likely related to a fetal type right PCA. Diffuse edema and mass effect. Developing left posterior watershed territory infarct.   CTA head and neck - right M1 cut off, b/l fetal PCAs with dimunitive BA and VAs  MRI head - consider once stabilized  2D Echo - pending  LDL - 38   HgbA1c - 14.3  VTE prophylaxis - SCDs (not on lovenox due to critical anemia) Diet NPO time  specified  No antithrombotic prior to admission, now on ASA 325mg   Patient will be counseled to be compliant with his antithrombotic medications  Ongoing aggressive stroke risk factor management  Therapy recommendations:  pending  Disposition:  Pending  Cerebral edema  CT head large right MCA infarct with diffuse edema and midline shift  Status post decompressive hemicraniectomy  Neurosurgery on board  On 3% saline  Sodium 158  Sodium goal of 150-160, continue monitoring  Q6h  Acute blood loss anemia  Source unclear, could be due to surgery  Hb 12.6 - 10.9 - 8.2 - 7.0 - 6.2 - PRBC - 7.5  S/p PRBC transfusion  CBC monitoring  Diabetes with DKA  HgbA1c 14.3, goal < 7.0  Uncontrolled  Off insulin drip, on Levemir  Still significant hyperglycemia  CCM on board  Leukocytosis,? Sepsis  WBC 18.8 -> 26.2  On Zosyn and vanco -> change to ampicillin  Off Levophed  CCM on board  ? Right facial twitching  Ipsilateral to the brain lesion, not fit to seizure activity  EEG no seizure  Continue to observe  Hypertension  Stable Off levophed for hypotension Long-term BP goal normotensive  Other Stroke Risk Factors  Obesity, Body mass index is 31.32 kg/m., recommend weight loss, diet and exercise as appropriate   Other Active Problems  Induced hypernatremia  Thrombocytopenia  Hospital day # 2  This patient is critically ill due to right malignant MCA infarct status post hemicraniectomy, embolic infarcts to left MCA, diabetes and DKA, sepsis, leukocytosis, question procedure and at significant risk of neurological worsening, death form cerebral edema, ankle herniation, status epilepticus, septic shock, DKA, hemorrhagic transformation. This patient's care requires constant monitoring of vital signs, hemodynamics, respiratory and cardiac monitoring, review of multiple databases, neurological assessment, discussion with family, other specialists and  medical decision making of high complexity. I spent 45 minutes of neurocritical care time in the care of this patient.  Rosalin Hawking, MD PhD Stroke Neurology 04/03/2017 4:43 PM   To contact Stroke Continuity provider, please refer to [email or web address] After hours, contact General Neurology

## 2017-04-03 NOTE — Progress Notes (Signed)
PULMONARY / CRITICAL CARE MEDICINE   Name: Joe Wilkinson MRN: [id number] DOB: [birth year]    ADMISSION DATE:  03/31/2017 CHIEF COMPLAINT:  CVA  HISTORY OF PRESENT ILLNESS:   56yo M found unresponsive at bustop admitted to [facility] in DKA, concern for sepsis w/o source, later developed hemiparesis and found to have acute Right MCA CVA.  Now transferred to [facility] ICU for NeuroSx care, seen by NSX and Neuro.  Tentative plan for crani in AM. He is intubated and sedated and unable to provide any HPI, ROS, PMH, PSH, FH, home meds, allergies.  All Hx form medical record.  SUBJECTIVE: Had fever overnight.  Hb down.  VITAL SIGNS: BP 124/74   Pulse (!) 108   Temp 99.9 F (37.7 C) (Core (Comment))   Resp 13   Ht 5\' 10"  (1.778 m)   Wt 218 lb 4.1 oz (99 kg)   SpO2 98%   BMI 31.32 kg/m   VENTILATOR SETTINGS: Vent Mode: PSV;CPAP FiO2 (%):  [40 %] 40 % Set Rate:  [16 bmp] 16 bmp Vt Set:  [550 mL] 550 mL PEEP:  [5 cmH20] 5 cmH20 Pressure Support:  [5 cmH20] 5 cmH20 Plateau Pressure:  [14 cmH20-15 cmH20] 14 cmH20  INTAKE / OUTPUT: I/O last 3 completed shifts: In: 73 [I.V.:2771; NG/GT:2280; IV Piggyback:475] Out: 2345 [Urine:2345]  PHYSICAL EXAMINATION:  General - tolerating pressure support Eyes - periorbital edema Rt > Lt ENT - ETT in place Cardiac - regular, tachycardic, no murmur Chest - no wheeze, rales Abd - soft, non tender Ext - no edema Skin - no rashes Neuro - moves Rt side   BMET  Recent Labs Lab 04/01/17 1124  04/02/17 0607  04/02/17 1748 04/03/17 0050 04/03/17 0554  NA 145  < > 154*  < > 155* 157* 159*  K 4.4  --  3.8  --   --   --  4.3  CL 117*  --  124*  --   --   --  130*  CO2 20*  --  23  --   --   --  24  BUN 10  --  17  --   --   --  17  CREATININE 0.93  --  1.05  --   --   --  1.04  GLUCOSE 303*  --  227*  --   --   --  242*  < > = values in this interval not displayed.  Electrolytes  Recent Labs Lab 04/01/17 1124 04/01/17 2037 04/02/17 0607  04/02/17 1700 04/03/17 0554  CALCIUM 7.4*  --  7.9*  --  8.4*  MG 1.7 2.0 2.0 2.2  --   PHOS 3.0 1.6* 1.8* 1.9*  --     CBC  Recent Labs Lab 04/01/17 1124 04/02/17 0607 04/03/17 0554  WBC 15.1* 18.8* 26.2*  HGB 8.7* 7.0* 6.2*  HCT 26.5* 21.9* 20.3*  PLT 111* 104* 145*    Coag's  Recent Labs Lab 03/31/17 1114 04/01/17 0225  APTT 33 31  INR 1.12  --     Sepsis Markers  Recent Labs Lab 03/31/17 1114 04/01/17 0225  PROCALCITON 4.28 2.53    ABG  Recent Labs Lab 03/30/17 2321 03/31/17 0542 04/01/17 0250  PHART 7.21* 7.44 7.483*  PCO2ART 21* 36 28.3*  PO2ART 154* 148* 136*    Liver Enzymes  Recent Labs Lab 03/30/17 1808 04/01/17 0225  AST 23 40  ALT 14* 13*  ALKPHOS 94 58  BILITOT 2.3* 1.7*  ALBUMIN  3.3* 2.2*    Cardiac Enzymes  Recent Labs Lab 03/30/17 1808  TROPONINI <0.03    Glucose  Recent Labs Lab 04/02/17 1121 04/02/17 1522 04/02/17 1952 04/02/17 2353 04/03/17 0328 04/03/17 0826  GLUCAP 263* 267* 258* 244* 205* 239*    Imaging Dg Chest Port 1 View  Result Date: 04/03/2017 CLINICAL DATA:  Respiratory failure EXAM: PORTABLE CHEST 1 VIEW COMPARISON:  04/02/2017 FINDINGS: Support devices are stable. Heart is normal size. Minimal bibasilar atelectasis, improved. No effusions or acute bony abnormality. IMPRESSION: Minimal bibasilar atelectasis, improved since prior study. Electronically Signed   By: Rolm Baptise M.D.   On: 04/03/2017 07:13     STUDIES:  LP 8/24 >> glucose 205, RBC 16, protein 62, WBC 5 CT head 8/24 >> Large Rt MCA EEG 8/27 >>  CULTURES: CSF 8/24>> BCX 8/24>> UCX 8/24>> Sputum 8/24>>  ANTIBIOTICS: vanco 8/24 >>  Zosyn 8/24 >>   SIGNIFICANT EVENTS: 8/23 Admit [facility] 8/23 8/24 Transfer to [facility] after Rt MCA CVA, start 3% NS 8/25 Decompressive craniectomy 8/27 Transfuse 1 unit PRBC  LINES/TUBES: ETT 8/23 >> Lt IJ CVL 8/23 >>  ASSESSMENT / PLAN:  PULMONARY A: Compromised airway. P:   Pressure  support wean as tolerated Defer extubation trial until neuro status more stable F/u CXR  CARDIOVASCULAR A:  Septic shock. P:  Goal MAP > 65, SBP 120 per neurology  RENAL A:   Medically induced hypernatremia. Hypophosphatemia. P:  Goal Na 150 to 155 per neurology  GASTROINTESTINAL A:   Nutrition. P:   Tube feeds  HEMATOLOGIC A:   Anemia of critical illness. Thrombocytopenia from sepsis. P:  F/u Hb after transfusion on 8/27  INFECTIOUS A:   Sepsis >> source unclear. P:   Day 4 of Abx  f/u Cx from [place]:   DKA - resolved. DM type I. P:   SSI Levemir increased to 30 units daily  NEUROLOGIC A:   Large right MCA CVA. S/p decompressive craniectomy. P:   Neurology following Noted to have facial twitching 8/26 >> F/u EEG Neurosurgery s/o 04/02/17 >> recommend d/cing sutures 10 to 14 days after surgery, and f/u with neurosurgery in 3 months to assess for cranioplasty  CC time 31 minutes  Chesley Mires, MD [place] 04/03/2017, 9:08 AM Pager:  [phone] After 3pm call: [phone]

## 2017-04-03 NOTE — Progress Notes (Signed)
Inpatient Diabetes Program Recommendations  AACE/ADA: New Consensus Statement on Inpatient Glycemic Control (2015)  Target Ranges:  Prepandial:   less than 140 mg/dL      Peak postprandial:   less than 180 mg/dL (1-2 hours)      Critically ill patients:  140 - 180 mg/dL   Lab Results  Component Value Date   GLUCAP 228 (H) 04/03/2017   HGBA1C 14.3 (H) 04/02/2017    Review of Glycemic Control  Blood sugars > 180 mg/dL. On TFs at 70/hr. Lantus increased today to 30 units QD  Inpatient Diabetes Program Recommendations:    Add Novolog 3 units Q4H for TF coverage. Please HOLD if TF is discontinued.  Will continue to follow.   Thank you. Lorenda Peck, RD, LDN, CDE Inpatient Diabetes Coordinator [phone]

## 2017-04-03 NOTE — Progress Notes (Signed)
*  PRELIMINARY RESULTS* Vascular Ultrasound Bilateral lower extremity venous duplex has been completed.  Preliminary findings: No evidence of deep vein thrombosis or baker's cysts bilaterally.   Everrett Coombe 04/03/2017, 3:07 PM

## 2017-04-03 NOTE — Procedures (Signed)
ELECTROENCEPHALOGRAM REPORT  Date of Study: 04/03/2017  Patient's Name: Joe Wilkinson MRN: [id number] Date of Birth: May 24, 1961  Referring Provider: Amie Portland, MD  Clinical History: 56 year old man with large right MCA stroke and watershed infarcts in the left hemisphere, status post right frontotemporoparietal craniectomy with reported twitching.  Medications: acetaminophen (TYLENOL) tablet 650 mg  aspirin tablet 325 mg  bisacodyl (DULCOLAX) EC tablet 5 mg  chlorhexidine gluconate (MEDLINE KIT) (PERIDEX) 0.12 % solution 15 mL  Chlorhexidine Gluconate Cloth 2 % PADS 6 each  docusate (COLACE) 50 MG/5ML liquid 100 mg  fentaNYL (SUBLIMAZE) bolus via infusion 50 mcg  hydrALAZINE (APRESOLINE) injection 5-10 mg  insulin aspart (novoLOG) injection 0-20 Units  ipratropium-albuterol (DUONEB) 0.5-2.5 (3) MG/3ML nebulizer solution 3 mL  norepinephrine (LEVOPHED) 16 mg in dextrose 5 % 250 mL (0.064 mg/mL) infusion  ondansetron (ZOFRAN) injection 4 mg  pantoprazole sodium (PROTONIX) 40 mg/20 mL oral suspension 40 mg  piperacillin-tazobactam (ZOSYN) IVPB 3.375 g  promethazine (PHENERGAN) tablet 12.5-25 mg  sennosides (SENOKOT) 8.8 MG/5ML syrup 5 mL  vancomycin (VANCOCIN) IVPB 750 mg/150 ml premix  Technical Summary: A multichannel digital EEG recording measured by the international 10-20 system with electrodes applied with paste and impedances below 5000 ohms performed as portable with EKG monitoring in a intubated and sedated patient.  Hyperventilation and photic stimulation were not performed.  The digital EEG was referentially recorded, reformatted, and digitally filtered in a variety of bipolar and referential montages for optimal display.   Description: The patient is sedated and poorly responsive with Fentanyl during the recording.  There is left hemispheric 4 Hz delta and 5-6 Hz theta slowing.  There is right hemispheric background suppression.  Stage 2 sleep is not seen.  No  epileptiform discharges or electrographic seizures were seen.  EKG lead was unremarkable.  Impression: This EEG is abnormal due to bilateral slowing with suppression of the right hemisphere.  Clinical Correlation of the above findings indicates bilateral cerebral dysfunction that is non-specific in etiology and can be seen with hypoxic/ischemic injury, toxic/metabolic encephalopathies, neurodegenerative disorders, or medication effect.  The absence of epileptiform discharges does not rule out a clinical diagnosis of epilepsy.  Clinical correlation is advised.  Metta Clines, DO

## 2017-04-03 NOTE — Progress Notes (Signed)
Pharmacy Antibiotic Note  Joe Wilkinson is a 56 y.o. male admitted on 03/31/2017 with DKA and concern for sepsis.  Pharmacy has been consulted for ampicillin for group B strep pneumonia. Pt initially started on vancomycin and zosyn but now de-escalating today. Tmax is 101.1 and WBC is elevated at 26.2, SCr is WNL and stable.   Plan: Ampicillin 2gm IV Q6H F/u renal fxn, C&S, clinical status and LOT *Pharmacy will sign off but continue to follow peripherally since no dose adjustments are anticipated. Thank you for the consult!  Temp (24hrs), Avg:100.2 F (37.9 C), Min:99.7 F (37.6 C), Max:101.1 F (38.4 C)   Recent Labs Lab 03/31/17 0151  03/31/17 1304 04/01/17 0225 04/01/17 1124 04/02/17 0607 04/03/17 0554  WBC 27.1*  --   --  22.2* 15.1* 18.8* 26.2*  CREATININE 1.42*  < > 0.86 0.90 0.93 1.05 1.04  < > = values in this interval not displayed.  Estimated Creatinine Clearance: 94.7 mL/min (by C-G formula based on SCr of 1.04 mg/dL).    No Known Allergies  Abx:  8/24 Ampicillin (x3 doses) > 8/24; 8/27>> 8/24 CTX x1 8/24 Vancomycin >> 8/27 8/25 Zosyn >>8/27  Microbiology:  8/23 MRSA PCR - neg 8/24 blood x 2 neg 8/24 csf - NGTD 8/24 resp - group B strep 8/24 Urine - NEG  Salome Arnt, PharmD, BCPS 04/03/2017 10:08 AM

## 2017-04-03 NOTE — Progress Notes (Signed)
[facility] Progress Note Patient Name: Joe Wilkinson DOB: May 18, 1961 MRN: [id number]   Date of Service  04/03/2017  HPI/Events of Note  Pulling at necessary tubes/lines  eICU Interventions  Renew restraints     Intervention Category Major Interventions: Change in mental status - evaluation and management  Simonne Maffucci 04/03/2017, 7:48 PM

## 2017-04-03 NOTE — Progress Notes (Signed)
   Sinus Tach worse to 131/min SBP 121 with levophed at 66mcg stable dose - goal sbp > 120 New fever > 100  Plan Tylenol and reassess Continue 3% saline  Dr. Brand Males, M.D., [facility].C.P Pulmonary and Critical Care Medicine Staff Physician [place] [facility] Pager: [phone], If no answer or between  15:00h - 7:00h: call [phone]  04/03/2017 12:04 AM

## 2017-04-03 NOTE — Progress Notes (Signed)
Bedside EEG completed, results pending. 

## 2017-04-03 NOTE — Progress Notes (Signed)
Pt.'s aunt Joe Wilkinson called for an update.  She has been making decisions on his behalf.  She lives in [place] and asked about how he can be transported to be closer to her once he is ready to discharge from [facility]. I explained our Case Manager and CSW will be following his needs and can help make arrangements once he is ready.

## 2017-04-03 NOTE — Progress Notes (Signed)
  Echocardiogram 2D Echocardiogram has been performed.  Darlina Sicilian M 04/03/2017, 2:06 PM

## 2017-04-04 ENCOUNTER — Inpatient Hospital Stay (HOSPITAL_COMMUNITY): Payer: Medicaid Other

## 2017-04-04 DIAGNOSIS — Z794 Long term (current) use of insulin: Secondary | ICD-10-CM

## 2017-04-04 DIAGNOSIS — I639 Cerebral infarction, unspecified: Secondary | ICD-10-CM

## 2017-04-04 DIAGNOSIS — E1159 Type 2 diabetes mellitus with other circulatory complications: Secondary | ICD-10-CM

## 2017-04-04 DIAGNOSIS — I480 Paroxysmal atrial fibrillation: Secondary | ICD-10-CM

## 2017-04-04 DIAGNOSIS — D62 Acute posthemorrhagic anemia: Secondary | ICD-10-CM

## 2017-04-04 LAB — CBC
HCT: 22.8 % — ABNORMAL LOW (ref 39.0–52.0)
Hemoglobin: 7 g/dL — ABNORMAL LOW (ref 13.0–17.0)
MCH: 26.9 pg (ref 26.0–34.0)
MCHC: 30.7 g/dL (ref 30.0–36.0)
MCV: 87.7 fL (ref 78.0–100.0)
PLATELETS: 184 10*3/uL (ref 150–400)
RBC: 2.6 MIL/uL — ABNORMAL LOW (ref 4.22–5.81)
RDW: 20.4 % — AB (ref 11.5–15.5)
WBC: 25.5 10*3/uL — ABNORMAL HIGH (ref 4.0–10.5)

## 2017-04-04 LAB — VITAMIN B12: VITAMIN B 12: 519 pg/mL (ref 180–914)

## 2017-04-04 LAB — GLUCOSE, CAPILLARY
GLUCOSE-CAPILLARY: 220 mg/dL — AB (ref 65–99)
GLUCOSE-CAPILLARY: 251 mg/dL — AB (ref 65–99)
GLUCOSE-CAPILLARY: 307 mg/dL — AB (ref 65–99)
GLUCOSE-CAPILLARY: 312 mg/dL — AB (ref 65–99)
Glucose-Capillary: 239 mg/dL — ABNORMAL HIGH (ref 65–99)

## 2017-04-04 LAB — HEPATIC FUNCTION PANEL
ALBUMIN: 1.7 g/dL — AB (ref 3.5–5.0)
ALT: 13 U/L — ABNORMAL LOW (ref 17–63)
AST: 33 U/L (ref 15–41)
Alkaline Phosphatase: 59 U/L (ref 38–126)
BILIRUBIN TOTAL: 1.3 mg/dL — AB (ref 0.3–1.2)
Bilirubin, Direct: 0.3 mg/dL (ref 0.1–0.5)
Indirect Bilirubin: 1 mg/dL — ABNORMAL HIGH (ref 0.3–0.9)
TOTAL PROTEIN: 5.6 g/dL — AB (ref 6.5–8.1)

## 2017-04-04 LAB — TROPONIN I
TROPONIN I: 0.04 ng/mL — AB (ref <0.03)
TROPONIN I: 0.04 ng/mL — AB (ref <0.03)
Troponin I: 0.03 ng/mL (ref <0.03)

## 2017-04-04 LAB — BASIC METABOLIC PANEL
Anion gap: 6 (ref 5–15)
BUN: 19 mg/dL (ref 6–20)
CALCIUM: 8.4 mg/dL — AB (ref 8.9–10.3)
CHLORIDE: 123 mmol/L — AB (ref 101–111)
CO2: 26 mmol/L (ref 22–32)
CREATININE: 0.9 mg/dL (ref 0.61–1.24)
Glucose, Bld: 340 mg/dL — ABNORMAL HIGH (ref 65–99)
Potassium: 5.6 mmol/L — ABNORMAL HIGH (ref 3.5–5.1)
SODIUM: 155 mmol/L — AB (ref 135–145)

## 2017-04-04 LAB — FOLATE: FOLATE: 14.4 ng/mL (ref >5.9)

## 2017-04-04 LAB — SODIUM
SODIUM: 157 mmol/L — AB (ref 135–145)
SODIUM: 158 mmol/L — AB (ref 135–145)
SODIUM: 158 mmol/L — AB (ref 135–145)

## 2017-04-04 LAB — RETICULOCYTES
RBC.: 2.99 MIL/uL — AB (ref 4.22–5.81)
RETIC COUNT ABSOLUTE: 200.3 10*3/uL — AB (ref 19.0–186.0)
RETIC CT PCT: 6.7 % — AB (ref 0.4–3.1)

## 2017-04-04 LAB — IRON AND TIBC
Iron: 42 ug/dL — ABNORMAL LOW (ref 45–182)
SATURATION RATIOS: 36 % (ref 17.9–39.5)
TIBC: 116 ug/dL — ABNORMAL LOW (ref 250–450)
UIBC: 74 ug/dL

## 2017-04-04 LAB — FERRITIN: Ferritin: 2186 ng/mL — ABNORMAL HIGH (ref 24–336)

## 2017-04-04 LAB — PHOSPHORUS: PHOSPHORUS: 2.8 mg/dL (ref 2.5–4.6)

## 2017-04-04 LAB — MAGNESIUM: MAGNESIUM: 2.2 mg/dL (ref 1.7–2.4)

## 2017-04-04 LAB — HEMOGLOBIN AND HEMATOCRIT, BLOOD
HEMATOCRIT: 26.8 % — AB (ref 39.0–52.0)
HEMOGLOBIN: 8.1 g/dL — AB (ref 13.0–17.0)

## 2017-04-04 MED ORDER — AMIODARONE HCL IN DEXTROSE 360-4.14 MG/200ML-% IV SOLN
60.0000 mg/h | INTRAVENOUS | Status: AC
  Administered 2017-04-04 (×2): 60 mg/h via INTRAVENOUS
  Filled 2017-04-04 (×2): qty 200

## 2017-04-04 MED ORDER — SODIUM CHLORIDE 0.9 % IV SOLN
0.0000 ug/min | INTRAVENOUS | Status: DC
  Administered 2017-04-04: 130 ug/min via INTRAVENOUS
  Administered 2017-04-04 (×2): 110 ug/min via INTRAVENOUS
  Administered 2017-04-04 (×2): 160 ug/min via INTRAVENOUS
  Administered 2017-04-04: 20 ug/min via INTRAVENOUS
  Administered 2017-04-04: 140 ug/min via INTRAVENOUS
  Administered 2017-04-04: 170 ug/min via INTRAVENOUS
  Administered 2017-04-04: 130 ug/min via INTRAVENOUS
  Administered 2017-04-04: 160 ug/min via INTRAVENOUS
  Administered 2017-04-04: 140 ug/min via INTRAVENOUS
  Administered 2017-04-04: 110 ug/min via INTRAVENOUS
  Administered 2017-04-04: 90 ug/min via INTRAVENOUS
  Administered 2017-04-04: 160 ug/min via INTRAVENOUS
  Administered 2017-04-05 (×2): 90 ug/min via INTRAVENOUS
  Filled 2017-04-04 (×22): qty 1

## 2017-04-04 MED ORDER — INSULIN ASPART 100 UNIT/ML ~~LOC~~ SOLN
3.0000 [IU] | SUBCUTANEOUS | Status: DC
  Administered 2017-04-04 – 2017-04-07 (×19): 3 [IU] via SUBCUTANEOUS

## 2017-04-04 MED ORDER — AMIODARONE LOAD VIA INFUSION
150.0000 mg | Freq: Once | INTRAVENOUS | Status: AC
  Administered 2017-04-04: 150 mg via INTRAVENOUS
  Filled 2017-04-04: qty 83.34

## 2017-04-04 MED ORDER — AMIODARONE HCL IN DEXTROSE 360-4.14 MG/200ML-% IV SOLN
30.0000 mg/h | INTRAVENOUS | Status: DC
  Administered 2017-04-04 – 2017-04-06 (×7): 30 mg/h via INTRAVENOUS
  Filled 2017-04-04 (×6): qty 200

## 2017-04-04 NOTE — Plan of Care (Signed)
Problem: Nutrition: Goal: Adequate nutrition will be maintained Outcome: Completed/Met Date Met: 04/04/17 Vital tube feed running.  Goal rate 70.  Tolerated well.

## 2017-04-04 NOTE — Progress Notes (Addendum)
*  PRELIMINARY RESULTS* Vascular Ultrasound Upper Extremity Arterial Duplex has been completed.  Preliminary findings: Monophasic and abnormal waveforms throughout the right upper extremity with no obvious stenosis identified.  Duplicated brachial artery noted.  Everrett Coombe 04/04/2017, 3:58 PM

## 2017-04-04 NOTE — Progress Notes (Signed)
PULMONARY / CRITICAL CARE MEDICINE   Name: Joe Wilkinson MRN: [id number] DOB: 1960/10/15    ADMISSION DATE:  03/31/2017 CHIEF COMPLAINT:  CVA  HISTORY OF PRESENT ILLNESS:   56yo M found unresponsive at bustop admitted to 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  Now transferred to [facility] ICU for NeuroSx care, seen by NSX and Neuro.  Tentative plan for crani in AM. He is intubated and sedated and unable to provide any HPI, ROS, PMH, PSH, FH, home meds, allergies.  All Hx form medical record.  SUBJECTIVE: Hgb remains at 7 after 1u PRBC but no evidence of bleeding.  Tolerating PSV well.  VITAL SIGNS: BP 112/72   Pulse 68   Temp 99.9 F (37.7 C) (Core (Comment))   Resp 16   Ht 5\' 10"  (1.778 m)   Wt 98.3 kg (216 lb 11.4 oz)   SpO2 100%   BMI 31.09 kg/m   VENTILATOR SETTINGS: Vent Mode: PRVC FiO2 (%):  [40 %] 40 % Set Rate:  [16 bmp] 16 bmp Vt Set:  [550 mL] 550 mL PEEP:  [5 cmH20] 5 cmH20 Pressure Support:  [5 cmH20] 5 cmH20 Plateau Pressure:  [15 cmH20-17 cmH20] 17 cmH20  INTAKE / OUTPUT: I/O last 3 completed shifts: In: 5222.4 [I.V.:2512.4; Blood:30; NG/GT:2380; IV [id number]:233] Out: 0076 [AUQJF:3545]  PHYSICAL EXAMINATION: General - tolerating pressure support but not following commands Eyes - periorbital edema Rt > Lt ENT - ETT in place Cardiac - RRR, no M/R/G Chest - CTAB Abd - soft, non tender Ext - no edema Skin - no rashes Neuro - sedated, not following commands   BMET  Recent Labs Lab 04/02/17 0607  04/03/17 0554  04/03/17 1753 04/03/17 2353 04/04/17 0441  NA 154*  < > 159*  < > 158* 158* 155*  K 3.8  --  4.3  --   --   --  5.6*  CL 124*  --  130*  --   --   --  123*  CO2 23  --  24  --   --   --  26  BUN 17  --  17  --   --   --  19  CREATININE 1.05  --  1.04  --   --   --  0.90  GLUCOSE 227*  --  242*  --   --   --  340*  < > = values in this interval not  displayed.  Electrolytes  Recent Labs Lab 04/02/17 0607 04/02/17 1700 04/03/17 0554 04/04/17 0441  CALCIUM 7.9*  --  8.4* 8.4*  MG 2.0 2.2  --  2.2  PHOS 1.8* 1.9*  --  2.8    CBC  Recent Labs Lab 04/02/17 0607 04/03/17 0554 04/03/17 1258 04/04/17 0441  WBC 18.8* 26.2*  --  25.5*  HGB 7.0* 6.2* 7.5* 7.0*  HCT 21.9* 20.3* 24.5* 22.8*  PLT 104* 145*  --  184    Coag's  Recent Labs Lab 03/31/17 1114 04/01/17 0225  APTT 33 31  INR 1.12  --     Sepsis Markers  Recent Labs Lab 03/31/17 1114 04/01/17 0225  PROCALCITON 4.28 2.53    ABG  Recent Labs Lab 03/31/17 0542 04/01/17 0250 04/01/17 0904  PHART 7.44 7.483* 7.402  PCO2ART 36 28.3* 31.3*  PO2ART 148* 136* 353.0*    Liver Enzymes  Recent Labs Lab 03/30/17 1808 04/01/17 0225 04/04/17 0148  AST  23 40 33  ALT 14* 13* 13*  ALKPHOS 94 58 59  BILITOT 2.3* 1.7* 1.3*  ALBUMIN 3.3* 2.2* 1.7*    Cardiac Enzymes  Recent Labs Lab 03/30/17 1808 04/04/17 0148  TROPONINI <0.03 0.04*    Glucose  Recent Labs Lab 04/03/17 0826 04/03/17 1223 04/03/17 1648 04/03/17 2009 04/03/17 2334 04/04/17 0350  GLUCAP 239* 228* 238* 242* 234* 312*    Imaging Dg Chest Port 1 View  Result Date: 04/04/2017 CLINICAL DATA:  Respiratory failure EXAM: PORTABLE CHEST 1 VIEW COMPARISON:  04/03/2017 FINDINGS: Cardiac shadow is stable. Endotracheal tube, nasogastric catheter and left jugular central line are again noted and stable. The lungs are well aerated bilaterally. Left basilar atelectasis is noted. No other focal abnormality is seen. IMPRESSION: Tubes and lines as described above. Mild basilar atelectasis. Electronically Signed   By: Inez Catalina M.D.   On: 04/04/2017 07:38     STUDIES:  LP 8/24 >> glucose 205, RBC 16, protein 62, WBC 5 CT head 8/24 >> Large Rt MCA EEG 8/27 >> bilateral cerebral dysfunction, non-specific in etiology.  CULTURES: CSF 8/24>> neg BCX 8/24>> UCX 8/24>> neg Sputum  8/24>> s. agalactiae  ANTIBIOTICS: vanco 8/24 >>  Zosyn 8/24 >>  Ampicillin 8/27 >   SIGNIFICANT EVENTS: 8/23 Admit [facility] 8/23 8/24 Transfer to [facility] after Rt MCA CVA, start 3% NS 8/25 Decompressive craniectomy 8/27 Transfuse 1 unit PRBC  LINES/TUBES: ETT 8/23 >> Lt IJ CVL 8/23 >>  ASSESSMENT / PLAN:  PULMONARY A: Compromised airway. P:   Pressure support wean as tolerated Defer extubation trial until neuro status more stable F/u CXR  CARDIOVASCULAR A:  Septic shock. A.fib with RVR - resolved after norepinephrine d/c'd and amio started. P:  Continue neosynephrine as needed for goal MAP > 65, SBP 120 per neurology Continue amio Trend trop  RENAL A:   Medically induced hypernatremia. Hypophosphatemia - resolved. Mild hyperkalemia. P:  Goal Na 150 to 155 per neurology  GASTROINTESTINAL A:   Nutrition. P:   Tube feeds  HEMATOLOGIC A:   Anemia of critical illness - no evidence of bleeding. Thrombocytopenia from sepsis - resolved 8/28. P:  Repeat H/H this AM Transfuse for Hgb < 7 Assess anemia panel  INFECTIOUS A:   Sepsis - sputum culture positive for S. agalactiae P:   Continue ampicillin F/u blood Cx from [facility]  ENDOCRINE A:   DKA - resolved. DM type I. P:   SSI Levemir increased to 30 units daily  NEUROLOGIC A:   Large right MCA CVA with edema and 67mm MLS. S/p decompressive craniectomy. Facial twitching 8/26 - EEG showed absence of epileptiform discharges. P:   Sedation: Fentanyl gtt RASS goa: -1 Daily [facility] Neurology following Neurosurgery s/o 04/02/17 >> recommend d/cing sutures 10 to 14 days after surgery, and f/u with neurosurgery in 3 months to assess for cranioplasty   CC time: 30 min.   Montey Hora, [place] [facility] Critical [facility] Medicine Pager: [phone]  or [phone] 04/04/2017, 8:20 AM

## 2017-04-04 NOTE — Progress Notes (Signed)
Critical lab of troponin 0.04 called on the patient. E link called to notify

## 2017-04-04 NOTE — Progress Notes (Signed)
Patient's pulse jumped in the the 170's and sustained. E link paged to notified.

## 2017-04-04 NOTE — Progress Notes (Signed)
CCM NP on the unit and came to see the patient. Awaiting new orders.

## 2017-04-04 NOTE — Progress Notes (Addendum)
Inpatient Diabetes Program Recommendations  AACE/ADA: New Consensus Statement on Inpatient Glycemic Control (2015)  Target Ranges:  Prepandial:   less than 140 mg/dL      Peak postprandial:   less than 180 mg/dL (1-2 hours)      Critically ill patients:  140 - 180 mg/dL   Lab Results  Component Value Date   GLUCAP 307 (H) 04/04/2017   HGBA1C 14.3 (H) 04/02/2017    Review of Glycemic Control  Diabetes history: DM Current orders for Inpatient glycemic control: Levemir 30 units, Novolog Resistant 0-20 units Q4hours + Novolog 3 units Q4 hours CHO coverage  Inpatient Diabetes Program Recommendations:    Glucose 300's. Note Novolog 3 units Tube Feed Coverage added. Consider increasing Levemir to 36 units.  Thanks,  Tama Headings RN, MSN, [facility] Inpatient Diabetes Coordinator Team Pager [phone] (8a-5p)

## 2017-04-04 NOTE — Care Management Note (Signed)
Case Management Note  Patient Details  Name: Joe Wilkinson MRN: [id number] Date of Birth: 08-05-1961  Subjective/Objective:   Pt admitted on 03/31/17 with large Rt MCA CVA, after [place].  PTA, pt independent and living in [place].         Action/Plan: Pt currently remains intubated s/p decompressive crani on 04/01/17.  Spoke with pt's sister, Willeen Niece, [phone]), who lives in [place], [place]:  She states that pt was living in [place] with his girlfriend.  She states girlfriend has stated she is unable to care for pt at discharge.  Sister is interested in having pt moved eventually to [place] for "long term rehab", but understands that pt has no insurance.  She is interested in speaking with someone regarding applying for Medicaid.  I did explain that pt currently remains intubated, and has not been able to work with therapies yet.  For this reason, we don't know what type of facility he will need at discharge.  I have emailed the financial counselor assigned to Mr. Fultz's case, and asked her to contact pt's sister, as requested.  Will discuss case with CSW and follow up with sister as needed.  Sister understands that getting Medicaid approved may be a lengthy process.  Will continue to follow as pt progresses.     Expected Discharge Date:                  Expected Discharge Plan:     In-House Referral:  Clinical Social Work, Scientist, research (medical)  CM Consult  Post Acute Care Choice:    Choice offered to:     DME Arranged:    DME Agency:     HH Arranged:    [place] Agency:     Status of Service:  In process, will continue to follow  If discussed at Long Length of Stay Meetings, dates discussed:    Additional Comments:  Reinaldo Raddle, RN, BSN  Trauma/Neuro ICU Case Manager [phone]

## 2017-04-04 NOTE — Progress Notes (Signed)
PCCM INTERVAL PROGRESS NOTE  Called to bedisde to evaluate patient for new onset tachycardia. Upon my arrival HR 150s to 180s and appears irregular. BP stable at 120s over 70s, however, patient is on 18mcg norepinephrine for septic shock of uncertain origin. No clear P waves on EKG, but elevated rate makes this difficult to determine. Does appear to be atrial fibrillation.  Plan: DC norepinephrine in favor of phenylephrine Amiodarone bolus and infusion Cycle troponin Check mag  Georgann Housekeeper, AGACNP-BC [place] [facility]/Critical [facility] Pager [phone] or [phone]  04/04/2017 1:32 AM

## 2017-04-04 NOTE — Progress Notes (Signed)
STROKE TEAM PROGRESS NOTE   SUBJECTIVE (INTERVAL HISTORY) The patient remains intubated. Following commands. Right UE BP significantly lower than LUE. Will do RUE arterial doppler. Glucose still high and on levemir. CCM aware. Na 155, on the goal.   OBJECTIVE Temp:  [98.8 F (37.1 C)-99.9 F (37.7 C)] 99.9 F (37.7 C) (08/28 0000) Pulse Rate:  [68-172] 80 (08/28 1300) Cardiac Rhythm: Normal sinus rhythm (08/28 0900) Resp:  [13-26] 15 (08/28 1300) BP: (100-160)/(60-96) 135/74 (08/28 1300) SpO2:  [96 %-100 %] 100 % (08/28 1300) Arterial Line BP: (91-163)/(74-157) 163/157 (08/28 0900) FiO2 (%):  [40 %] 40 % (08/28 1131) Weight:  [216 lb 11.4 oz (98.3 kg)] 216 lb 11.4 oz (98.3 kg) (08/28 0458)  CBC:   Recent Labs Lab 04/01/17 0225  04/03/17 0554  04/04/17 0441 04/04/17 0932  WBC 22.2*  < > 26.2*  --  25.5*  --   NEUTROABS 15.1*  --   --   --   --   --   HGB 10.9*  < > 6.2*  < > 7.0* 8.1*  HCT 32.9*  < > 20.3*  < > 22.8* 26.8*  MCV 77.6*  < > 86.0  --  87.7  --   PLT 123*  < > 145*  --  184  --   < > = values in this interval not displayed.  Basic Metabolic Panel:   Recent Labs Lab 04/02/17 1700  04/03/17 0554  04/03/17 2353 04/04/17 0441  NA  --   < > 159*  < > 158* 155*  K  --   --  4.3  --   --  5.6*  CL  --   --  130*  --   --  123*  CO2  --   --  24  --   --  26  GLUCOSE  --   --  242*  --   --  340*  BUN  --   --  17  --   --  19  CREATININE  --   --  1.04  --   --  0.90  CALCIUM  --   --  8.4*  --   --  8.4*  MG 2.2  --   --   --   --  2.2  PHOS 1.9*  --   --   --   --  2.8  < > = values in this interval not displayed.  Lipid Panel:     Component Value Date/Time   CHOL 93 04/02/2017 0607   TRIG 135 04/02/2017 0607   HDL 28 (L) 04/02/2017 0607   CHOLHDL 3.3 04/02/2017 0607   VLDL 27 04/02/2017 0607   LDLCALC 38 04/02/2017 0607   HgbA1c:  Lab Results  Component Value Date   HGBA1C 14.3 (H) 04/02/2017   Urine Drug Screen: No results found for:  LABOPIA, COCAINSCRNUR, LABBENZ, AMPHETMU, THCU, LABBARB  Alcohol Level No results found for: [place] I have personally reviewed the radiological images below and agree with the radiology interpretations.  Ct Head Wo Contrast 03/31/2017 1. Progressive large right MCA territory infarct with involvement of the right occipital lobe likely related to a fetal type right posterior cerebral artery.  2. Diffuse edema and mass effect with 12 mm midline shift and partial effacement of the para mesencephalic cisterns.  3. Developing left posterior watershed territory infarct.  4. Nasal fluid and bilateral sinus disease likely related to intubation.   Ct Head Wo  Contrast 03/30/2017 No acute intracranial abnormality identified. Unremarkable CT of the brain.   Ct Angio Head and neck W Or Wo Contrast 04/02/2017 IMPRESSION: 1. Occlusion of the right middle cerebral artery proximal M1 segment with extensive cytotoxic edema throughout the entire right MCA distribution. 2. Status post right pterional craniectomy for decompression with unchanged leftward midline shift measuring 8 mm. 3. No hemorrhage. 4. Normal carotid arteries.  No embolic source identified.   LE venous doppler - No evidence of deep vein thrombosis or baker's cysts bilaterally.  EEG - This EEG is abnormal due to bilateral slowing with suppression of the right hemisphere. Clinical Correlation of the above findings indicates bilateral cerebral dysfunction that is non-specific in etiology and can be seen with hypoxic/ischemic injury, toxic/metabolic encephalopathies, neurodegenerative disorders, or medication effect.  The absence of epileptiform discharges does not rule out a clinical diagnosis of epilepsy.  Clinical correlation is advised.  TTE Left ventricle: The cavity size was normal. There was severe   concentric hypertrophy. Systolic function was vigorous. The   estimated ejection fraction was in the range of 65% to 70%. Wall   motion  was normal; there were no regional wall motion   abnormalities. Left ventricular diastolic function parameters   were normal. - Atrial septum: There was increased thickness of the septum,   consistent with lipomatous hypertrophy. - Pulmonary arteries: Systolic pressure could not be accurately   estimated.  CT head pending   PHYSICAL EXAM   Vitals:   04/04/17 1131 04/04/17 1200 04/04/17 1230 04/04/17 1300  BP:  (!) 150/83 131/70 135/74  Pulse: 79 76 76 80  Resp: 18 16 16 15   Temp:      TempSrc:      SpO2:  99% 100% 100%  Weight:      Height:       Middle-age African-American male who is sedated and intubated. Craniotomy bandage on the right skull. Afebrile. Head is nontraumatic. Cardiac exam no murmur or gallop. Distal pulses are well felt. Neurological Exam :  Sedated and intubated.  Right eye closed with eyelid edema, left eye open partially to stimulation. Follows simple commands on the right UE and LE. Right gaze preference, but able to cross midline to the left. Pupils 3 mm sluggish to light. Fundi could not be visualized. Corneal, gag and cough are present. Patient is able to move right upper and lower extremity spontaneously and against gravity with brisk withdrawal response to pain. Dense left hemiplegia with decreased tone. Reflexes present on the right and absent on the left. Left plantar upgoing right equivocal.   ASSESSMENT/PLAN Mr. Delores Thelen is a 56 y.o. male with history of diabetes mellitus (other past medical history unknown at this time) presenting with unresponsiveness. He did not receive IV t-PA due to decompressive right frontotemporoparietal craniectomy for decompression.  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 S/p right decompressive hemicraniectomy.   Resultant  left hemiplegia and right gaze   CT head - Progressive large right MCA territory infarct with involvement of the right  occipital lobe likely related to a fetal type right PCA. Diffuse edema and mass effect. Developing left posterior watershed territory infarct.   CTA head and neck - right M1 cut off, b/l fetal PCAs with dimunitive BA and VAs  MRI head - consider once stabilized  2D Echo - EF 65-70%  LDL - 38   HgbA1c - 14.3  VTE prophylaxis - SCDs (not  on lovenox due to critical anemia needed PRBC) Diet NPO time specified  No antithrombotic prior to admission, now on ASA 325mg   Patient will be counseled to be compliant with his antithrombotic medications  Ongoing aggressive stroke risk factor management  Therapy recommendations:  pending  Disposition:  Pending  Cerebral edema  CT head large right MCA infarct with diffuse edema and midline shift  Status post decompressive hemicraniectomy  Neurosurgery on board  Repeat CT in am  On 3% saline  Sodium 158->155  Sodium goal of 150-160, continue monitoring Q6h  PAF - could be potential source of current stroke  Occurred over night  Confirmed with EKG  HR goes to 140-150s  Reversed by amiodarone  Continue amiodarone  CCM on board  Acute blood loss anemia  Source unclear, could be due to surgery  Hb 12.6 - 10.9 - 8.2 - 7.0 - 6.2 - PRBC - 7.5 -8.1  S/p PRBC transfusion  CBC monitoring  Diabetes with DKA  HgbA1c 14.3, goal < 7.0  Uncontrolled  Off insulin drip, on Levemir  Still significant hyperglycemia  CCM on board  Leukocytosis,? Sepsis  WBC 18.8 -> 26.2->25.5  On Zosyn and vanco -> change to ampicillin  Off levophed and no Neo now  CCM on board  ? Right facial twitching  Ipsilateral to the brain lesion, not fit to seizure activity  EEG no seizure  Continue to observe  Hypertension  Stable Off levophed but on Neo for hypotension BP goal 120-160 Long-term BP goal normotensive  Other Stroke Risk Factors  Obesity, Body mass index is 31.09 kg/m., recommend weight loss, diet and exercise as  appropriate   Other Active Problems  Induced hypernatremia  Thrombocytopenia - resolved  Hospital day # 3  This patient is critically ill due to right malignant MCA infarct status post hemicraniectomy, embolic infarcts to left MCA, PAF, diabetes and DKA, sepsis, leukocytosis, question procedure and at significant risk of neurological worsening, death form cerebral edema, ankle herniation, heart failure, status epilepticus, septic shock, DKA, hemorrhagic transformation. This patient's care requires constant monitoring of vital signs, hemodynamics, respiratory and cardiac monitoring, review of multiple databases, neurological assessment, discussion with family, other specialists and medical decision making of high complexity. I spent 40 minutes of neurocritical care time in the care of this patient.  Rosalin Hawking, MD PhD Stroke Neurology 04/04/2017 3:00 PM   To contact Stroke Continuity provider, please refer to [email or web address] After hours, contact General Neurology

## 2017-04-05 ENCOUNTER — Inpatient Hospital Stay (HOSPITAL_COMMUNITY): Payer: Medicaid Other

## 2017-04-05 LAB — BPAM RBC
BLOOD PRODUCT EXPIRATION DATE: 201808312359
BLOOD PRODUCT EXPIRATION DATE: 201809052359
ISSUE DATE / TIME: 201808270737
UNIT TYPE AND RH: 9500
Unit Type and Rh: 9500

## 2017-04-05 LAB — BASIC METABOLIC PANEL
ANION GAP: 7 (ref 5–15)
BUN: 16 mg/dL (ref 6–20)
CHLORIDE: 123 mmol/L — AB (ref 101–111)
CO2: 28 mmol/L (ref 22–32)
Calcium: 8.3 mg/dL — ABNORMAL LOW (ref 8.9–10.3)
Creatinine, Ser: 0.91 mg/dL (ref 0.61–1.24)
GFR calc non Af Amer: >60 mL/min (ref >60)
Glucose, Bld: 206 mg/dL — ABNORMAL HIGH (ref 65–99)
Potassium: 3.5 mmol/L (ref 3.5–5.1)
SODIUM: 158 mmol/L — AB (ref 135–145)

## 2017-04-05 LAB — GLUCOSE, CAPILLARY
GLUCOSE-CAPILLARY: 205 mg/dL — AB (ref 65–99)
GLUCOSE-CAPILLARY: 206 mg/dL — AB (ref 65–99)
GLUCOSE-CAPILLARY: 238 mg/dL — AB (ref 65–99)
GLUCOSE-CAPILLARY: 243 mg/dL — AB (ref 65–99)
Glucose-Capillary: 206 mg/dL — ABNORMAL HIGH (ref 65–99)
Glucose-Capillary: 210 mg/dL — ABNORMAL HIGH (ref 65–99)
Glucose-Capillary: 227 mg/dL — ABNORMAL HIGH (ref 65–99)

## 2017-04-05 LAB — TYPE AND SCREEN
ABO/RH(D): O NEG
Antibody Screen: NEGATIVE
UNIT DIVISION: 0
Unit division: 0

## 2017-04-05 LAB — CBC
HCT: 22.8 % — ABNORMAL LOW (ref 39.0–52.0)
HEMOGLOBIN: 6.6 g/dL — AB (ref 13.0–17.0)
MCH: 27.5 pg (ref 26.0–34.0)
MCHC: 28.9 g/dL — AB (ref 30.0–36.0)
MCV: 95 fL (ref 78.0–100.0)
Platelets: 173 10*3/uL (ref 150–400)
RBC: 2.4 MIL/uL — ABNORMAL LOW (ref 4.22–5.81)
RDW: 21.5 % — ABNORMAL HIGH (ref 11.5–15.5)
WBC: 24 10*3/uL — ABNORMAL HIGH (ref 4.0–10.5)

## 2017-04-05 LAB — PHOSPHORUS: PHOSPHORUS: 3.3 mg/dL (ref 2.5–4.6)

## 2017-04-05 LAB — CULTURE, BLOOD (ROUTINE X 2)
Culture: NO GROWTH
Culture: NO GROWTH
Special Requests: ADEQUATE

## 2017-04-05 LAB — PREPARE RBC (CROSSMATCH)

## 2017-04-05 LAB — MAGNESIUM: MAGNESIUM: 2 mg/dL (ref 1.7–2.4)

## 2017-04-05 LAB — HEMOGLOBIN AND HEMATOCRIT, BLOOD
HCT: 23.9 % — ABNORMAL LOW (ref 39.0–52.0)
Hemoglobin: 7.2 g/dL — ABNORMAL LOW (ref 13.0–17.0)

## 2017-04-05 LAB — SODIUM
SODIUM: 157 mmol/L — AB (ref 135–145)
Sodium: 156 mmol/L — ABNORMAL HIGH (ref 135–145)

## 2017-04-05 MED ORDER — SODIUM CHLORIDE 0.9 % IV SOLN: Freq: Once | INTRAVENOUS | Status: DC

## 2017-04-05 MED ORDER — SODIUM CHLORIDE 0.9 % IV SOLN
0.0000 ug/min | INTRAVENOUS | Status: DC
  Administered 2017-04-05 (×2): 140 ug/min via INTRAVENOUS
  Administered 2017-04-06 (×2): 120 ug/min via INTRAVENOUS
  Filled 2017-04-05 (×4): qty 4

## 2017-04-05 MED ORDER — METOPROLOL TARTRATE 5 MG/5ML IV SOLN
5.0000 mg | Freq: Once | INTRAVENOUS | Status: AC
  Administered 2017-04-05: 5 mg via INTRAVENOUS

## 2017-04-05 MED ORDER — METOPROLOL TARTRATE 5 MG/5ML IV SOLN
INTRAVENOUS | Status: AC
  Administered 2017-04-05: 5 mg via INTRAVENOUS
  Filled 2017-04-05: qty 5

## 2017-04-05 MED ORDER — METOPROLOL TARTRATE 5 MG/5ML IV SOLN
5.0000 mg | INTRAVENOUS | Status: DC | PRN
Start: 2017-04-05 — End: 2017-04-26
  Administered 2017-04-05 – 2017-04-11 (×3): 5 mg via INTRAVENOUS
  Filled 2017-04-05 (×3): qty 5

## 2017-04-05 NOTE — Progress Notes (Signed)
RN calling eMD  Reports sudden HR 145 jump Patient on neo 62mcg and amio gtt and hydralazine prn  On came4ra SBP 140 with map 100 Patient calm  Plan Dc hydralazine prn due to hyotension and reflex tachycardia potentia Wean off neo Considr beta blocker if above do not work    Dr. Brand Males, M.D., [facility].C.P Pulmonary and Critical Care Medicine Staff Physician [place] Pager: [phone], If no answer or between  15:00h - 7:00h: call [phone]  04/05/2017 5:48 AM

## 2017-04-05 NOTE — Progress Notes (Signed)
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  Now transferred to [facility] ICU for NeuroSx care, seen by NSX and Neuro.  Tentative plan for crani in AM. He is intubated and sedated and unable to provide any HPI, ROS, PMH, PSH, FH, home meds, allergies.  All Hx form medical record.  SUBJECTIVE:  A fib with RVR.  VITAL SIGNS: BP 94/69   Pulse (!) 148   Temp 99.9 F (37.7 C) (Core (Comment))   Resp 18   Ht 5\' 10"  (1.778 m)   Wt 221 lb 1.9 oz (100.3 kg)   SpO2 99%   BMI 31.73 kg/m   VENTILATOR SETTINGS: Vent Mode: PRVC FiO2 (%):  [40 %] 40 % Set Rate:  [16 bmp] 16 bmp Vt Set:  [550 mL] 550 mL PEEP:  [5 cmH20] 5 cmH20 Pressure Support:  [5 cmH20] 5 cmH20 Plateau Pressure:  [15 cmH20-18 cmH20] 16 cmH20  INTAKE / OUTPUT: I/O last 3 completed shifts: In: 6794.3 [I.V.:4994.3; NG/GT:1750; IV Piggyback:50] Out: 4100 [Urine:4100]  PHYSICAL EXAMINATION:  General - remains on vent Eyes - pupils reactive ENT - ETT in place Cardiac - irregular, tachycardic, no murmur Chest - no wheeze, rales Abd - soft, non tender Ext - 1+ edema Skin - no rashes Neuro - moves Rt side   BMET  Recent Labs Lab 04/02/17 0607  04/03/17 0554  04/04/17 0441 04/04/17 1337 04/04/17 2240  NA 154*  < > 159*  < > 155* 158* 157*  K 3.8  --  4.3  --  5.6*  --   --   CL 124*  --  130*  --  123*  --   --   CO2 23  --  24  --  26  --   --   BUN 17  --  17  --  19  --   --   CREATININE 1.05  --  1.04  --  0.90  --   --   GLUCOSE 227*  --  242*  --  340*  --   --   < > = values in this interval not displayed.  Electrolytes  Recent Labs Lab 04/02/17 0607 04/02/17 1700 04/03/17 0554 04/04/17 0441  CALCIUM 7.9*  --   8.4* 8.4*  MG 2.0 2.2  --  2.2  PHOS 1.8* 1.9*  --  2.8    CBC  Recent Labs Lab 04/03/17 0554  04/04/17 0441 04/04/17 0932 04/05/17 0717  WBC 26.2*  --  25.5*  --  24.0*  HGB 6.2*  < > 7.0* 8.1* 6.6*  HCT 20.3*  < > 22.8* 26.8* 22.8*  PLT 145*  --  184  --  173  < > = values in this interval not displayed.  Coag's  Recent Labs Lab 03/31/17 1114 04/01/17 0225  APTT 33 31  INR 1.12  --     Sepsis Markers  Recent Labs Lab 03/31/17 1114 04/01/17 0225  PROCALCITON 4.28 2.53    ABG  Recent Labs Lab 03/31/17 0542 04/01/17 0250 04/01/17 0904  PHART 7.44 7.483* 7.402  PCO2ART 36 28.3* 31.3*  PO2ART 148* 136* 353.0*    Liver Enzymes  Recent Labs Lab 03/30/17 1808  04/01/17 0225 04/04/17 0148  AST 23 40 33  ALT 14* 13* 13*  ALKPHOS 94 58 59  BILITOT 2.3* 1.7* 1.3*  ALBUMIN 3.3* 2.2* 1.7*    Cardiac Enzymes  Recent Labs Lab 04/04/17 0148 04/04/17 0932 04/04/17 1500  TROPONINI 0.04* 0.03* 0.04*    Glucose  Recent Labs Lab 04/04/17 0810 04/04/17 1115 04/04/17 1535 04/04/17 2000 04/05/17 0001 04/05/17 0349  GLUCAP 307* 220* 239* 206* 251* 205*    Imaging Ct Head Wo Contrast  Result Date: 04/05/2017 CLINICAL DATA:  Stroke, follow-up craniectomy. EXAM: CT HEAD WITHOUT CONTRAST TECHNIQUE: Contiguous axial images were obtained from the base of the skull through the vertex without intravenous contrast. COMPARISON:  CT HEAD April 02, 2017 FINDINGS: BRAIN: Evolving large RIGHT frontotemporal parietal lobes with complete sulcal effacement with petechial hemorrhage. Similar RIGHT basal ganglia cytotoxic edema. Mesial RIGHT frontoparietal and RIGHT occipital lobe infarcts. RIGHT petechial hemorrhage without hemorrhagic conversion. Stable external herniation of RIGHT cerebrum via craniectomy defect. Small LEFT parietal lobe infarct. RIGHT lateral ventricle effacement with 7 mm RIGHT to LEFT midline shift. No LEFT ventricle entrapment. Mild RIGHT uncal  herniation. Narrowed though patent basal cisterns. VASCULAR: Trace calcific atherosclerosis carotid siphons. Dense RIGHT MCA is unchanged. SKULL/SOFT TISSUES: Status post RIGHT frontoparietal craniectomy. RIGHT scalp soft tissue swelling and skin staples with minimal subcutaneous gas. ORBITS/SINUSES: Old RIGHT medial orbital blowout fracture. Pan paranasal sinus mucosal thickening. Bilateral mastoid effusions without air cell coalescence. OTHER: None. IMPRESSION: 1. Evolving acute extensive RIGHT cerebrum infarcts including RIGHT MCA, to lesser extent RIGHT ACA and RIGHT PCA territories. Petechial hemorrhage without hemorrhagic conversion. 2. Similar 7 mm RIGHT to LEFT midline shift without ventricular entrapment. Similar external herniation via RIGHT craniectomy defect. 3. Similar acute small LEFT parietal lobe infarct. Electronically Signed   By: Elon Alas M.D.   On: 04/05/2017 05:44   Dg Chest Port 1 View  Result Date: 04/05/2017 CLINICAL DATA:  Respiratory failure. EXAM: PORTABLE CHEST 1 VIEW COMPARISON:  04/04/2017 . FINDINGS: Endotracheal tube, NG tube, left IJ line stable position. Heart size normal. Low lung volumes with persistent bibasilar infiltrates/edema. No interim change. No prominent pleural effusion. No pneumothorax . IMPRESSION: 1. Lines and tubes stable position. 2. Low lung volumes with persistent mild bibasilar infiltrates/edema. Electronically Signed   By: Marcello Moores  Register   On: 04/05/2017 07:23     STUDIES:  LP 8/24 >> glucose 205, RBC 16, protein 62, WBC 5 CT head 8/24 >> Large Rt MCA EEG 8/27 >> bilateral cerebral dysfunction, non-specific in etiology Echo 8/27 >> EF 65 to 70%  CULTURES: CSF 8/24>> negative BCX 8/24>> negative UCX 8/24>> negative Sputum 8/24>> s. agalactiae  ANTIBIOTICS: vanco 8/24 >> 8/26 Zosyn 8/24 >> 8/26 Ampicillin 8/27 >   SIGNIFICANT EVENTS: 8/23 Admit [facility] 8/23 8/24 Transfer to [facility] after Rt MCA CVA, start 3% NS 8/25 Decompressive  craniectomy 8/27 Transfuse 1 unit PRBC 8/28 Transfuse 1 unit PRBC, A fib with RVR  LINES/TUBES: ETT 8/23 >> Lt IJ CVL 8/23 >>  ASSESSMENT / PLAN:  PULMONARY A: Compromised airway. P:   Pressure support wean as able >> hemodynamics preclude this at present F/u CXR  CARDIOVASCULAR A:  Septic shock. A.fib with RVR - resolved after norepinephrine d/c'd and amio started. P:  Lopressor 5 mg IV prn for HR > 120 Continue amiodarone Pressors to keep MAP > 65  RENAL A:   Medically induced hypernatremia. P:  Goal Na 150 to 155 per neurology  GASTROINTESTINAL A:   Nutrition.  P:   Tube feeds  HEMATOLOGIC A:   Anemia of critical illness - no evidence of bleeding. Thrombocytopenia from sepsis - resolved 8/28. P:  F/u Hb after PRBC transfusion 8/29  INFECTIOUS A:   Sepsis - sputum culture positive for S. Agalactiae. P:   Day 6/7 abx, currently on ampicillin  ENDOCRINE A:   DKA - resolved. DM type I. P:   SSI with tube feed coverage Continue levemir 30 units daily  NEUROLOGIC A:   Large right MCA CVA with edema and 61mm MLS. S/p decompressive craniectomy. P:   RASS goal 0 to -1 Neurosurgery s/o 04/02/17 >> recommend d/cing sutures 10 to 14 days after surgery, and f/u with neurosurgery in 3 months to assess for cranioplasty  CC time 31 minutes  Chesley Mires, MD [place] 04/05/2017, 8:11 AM Pager:  [phone] After 3pm call: [phone]

## 2017-04-05 NOTE — Progress Notes (Signed)
STROKE TEAM PROGRESS NOTE   SUBJECTIVE (INTERVAL HISTORY) The patient remains intubated. More lethargic than yesterday but still able to open eyes and following limited commands. Hb again drop to 6.6, pending PRBC transfusion. BP still low, put back on levophed.   OBJECTIVE Temp:  [98.7 F (37.1 C)-100.3 F (37.9 C)] 98.9 F (37.2 C) (08/29 2000) Pulse Rate:  [75-160] 88 (08/29 2300) Cardiac Rhythm: Normal sinus rhythm (08/29 2000) Resp:  [16-20] 17 (08/29 2300) BP: (86-144)/(56-99) 142/83 (08/29 2300) SpO2:  [94 %-100 %] 96 % (08/29 2305) FiO2 (%):  [40 %] 40 % (08/29 2305) Weight:  [221 lb 1.9 oz (100.3 kg)] 221 lb 1.9 oz (100.3 kg) (08/29 0440)  CBC:   Recent Labs Lab 04/01/17 0225  04/04/17 0441  04/05/17 0717 04/05/17 1429  WBC 22.2*  < > 25.5*  --  24.0*  --   NEUTROABS 15.1*  --   --   --   --   --   HGB 10.9*  < > 7.0*  < > 6.6* 7.2*  HCT 32.9*  < > 22.8*  < > 22.8* 23.9*  MCV 77.6*  < > 87.7  --  95.0  --   PLT 123*  < > 184  --  173  --   < > = values in this interval not displayed.  Basic Metabolic Panel:   Recent Labs Lab 04/04/17 0441  04/05/17 0423 04/05/17 1429 04/05/17 1822  NA 155*  < > 158* 157* 156*  K 5.6*  --  3.5  --   --   CL 123*  --  123*  --   --   CO2 26  --  28  --   --   GLUCOSE 340*  --  206*  --   --   BUN 19  --  16  --   --   CREATININE 0.90  --  0.91  --   --   CALCIUM 8.4*  --  8.3*  --   --   MG 2.2  --  2.0  --   --   PHOS 2.8  --  3.3  --   --   < > = values in this interval not displayed.  Lipid Panel:     Component Value Date/Time   CHOL 93 04/02/2017 0607   TRIG 135 04/02/2017 0607   HDL 28 (L) 04/02/2017 0607   CHOLHDL 3.3 04/02/2017 0607   VLDL 27 04/02/2017 0607   LDLCALC 38 04/02/2017 0607   HgbA1c:  Lab Results  Component Value Date   HGBA1C 14.3 (H) 04/02/2017   Urine Drug Screen: No results found for: LABOPIA, COCAINSCRNUR, LABBENZ, AMPHETMU, THCU, LABBARB  Alcohol Level No results found for:  [place] I have personally reviewed the radiological images below and agree with the radiology interpretations.  Ct Head Wo Contrast 03/31/2017 1. Progressive large right MCA territory infarct with involvement of the right occipital lobe likely related to a fetal type right posterior cerebral artery.  2. Diffuse edema and mass effect with 12 mm midline shift and partial effacement of the para mesencephalic cisterns.  3. Developing left posterior watershed territory infarct.  4. Nasal fluid and bilateral sinus disease likely related to intubation.   Ct Head Wo Contrast 03/30/2017 No acute intracranial abnormality identified. Unremarkable CT of the brain.   Ct Angio Head and neck W Or Wo Contrast 04/02/2017 IMPRESSION: 1. Occlusion of the right middle cerebral artery proximal M1 segment with extensive  cytotoxic edema throughout the entire right MCA distribution. 2. Status post right pterional craniectomy for decompression with unchanged leftward midline shift measuring 8 mm. 3. No hemorrhage. 4. Normal carotid arteries.  No embolic source identified.   LE venous doppler - No evidence of deep vein thrombosis or baker's cysts bilaterally.  EEG - This EEG is abnormal due to bilateral slowing with suppression of the right hemisphere. Clinical Correlation of the above findings indicates bilateral cerebral dysfunction that is non-specific in etiology and can be seen with hypoxic/ischemic injury, toxic/metabolic encephalopathies, neurodegenerative disorders, or medication effect.  The absence of epileptiform discharges does not rule out a clinical diagnosis of epilepsy.  Clinical correlation is advised.  TTE Left ventricle: The cavity size was normal. There was severe   concentric hypertrophy. Systolic function was vigorous. The   estimated ejection fraction was in the range of 65% to 70%. Wall   motion was normal; there were no regional wall motion   abnormalities. Left ventricular diastolic  function parameters   were normal. - Atrial septum: There was increased thickness of the septum,   consistent with lipomatous hypertrophy. - Pulmonary arteries: Systolic pressure could not be accurately   estimated.  Ct Head Wo Contrast 04/05/2017 IMPRESSION: 1. Evolving acute extensive RIGHT cerebrum infarcts including RIGHT MCA, to lesser extent RIGHT ACA and RIGHT PCA territories. Petechial hemorrhage without hemorrhagic conversion. 2. Similar 7 mm RIGHT to LEFT midline shift without ventricular entrapment. Similar external herniation via RIGHT craniectomy defect. 3. Similar acute small LEFT parietal lobe infarct.    PHYSICAL EXAM   Vitals:   04/05/17 2100 04/05/17 2200 04/05/17 2300 04/05/17 2305  BP: 127/85 134/79 (!) 142/83   Pulse: 80 89 88   Resp: 20 16 17    Temp:      TempSrc:      SpO2: 97% 95% 96% 96%  Weight:      Height:       54 African-American male who is sedated and intubated. Craniotomy bandage on the right skull. Afebrile. Head is nontraumatic. Cardiac exam no murmur or gallop. Distal pulses are well felt. Neurological Exam :  Sedated and intubated.  Right eye closed with eyelid edema, left eye open partially to stimulation. Follows simple commands on the right UE and LE. Right gaze preference, but able to cross midline to the left. Pupils 3 mm sluggish to light. Fundi could not be visualized. Corneal, gag and cough are present. Patient is able to move right upper and lower extremity spontaneously and against gravity with brisk withdrawal response to pain. Dense left hemiplegia with decreased tone. Reflexes present on the right and absent on the left. Left plantar upgoing right equivocal.   ASSESSMENT/PLAN Joe Wilkinson is a 56 y.o. male with history of diabetes mellitus (other past medical history unknown at this time) presenting with unresponsiveness. He did not receive IV t-PA due to decompressive right frontotemporoparietal craniectomy for  decompression.  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 S/p right decompressive hemicraniectomy.   Resultant  left hemiplegia and right gaze   CT head - Progressive large right MCA territory infarct with involvement of the right occipital lobe likely related to a fetal type right PCA. Diffuse edema and mass effect. Developing left posterior watershed territory infarct.   CTA head and neck - right M1 cut off, b/l fetal PCAs with dimunitive BA and VAs  MRI head - consider once stabilized  2D Echo -  EF 65-70%  EEG no seizure  LDL - 38   HgbA1c - 14.3  VTE prophylaxis - SCDs (not on lovenox due to critical anemia needed PRBC) Diet NPO time specified  No antithrombotic prior to admission, now on ASA 325mg   Patient will be counseled to be compliant with his antithrombotic medications  Ongoing aggressive stroke risk factor management  Therapy recommendations:  pending  Disposition:  Pending  Cerebral edema  CT head large right MCA infarct with diffuse edema and midline shift  Status post decompressive hemicraniectomy  Neurosurgery on board  Repeat CT evolving right malignant MCA, stable   Sodium 158->155->158  Sodium goal of 150-160, continue monitoring Q6h  PAF - could be potential source of current stroke  Occurred over night  Confirmed with EKG  HR goes to 140-150s  Reversed by amiodarone  Continue on amiodarone  CCM on board  Acute blood loss anemia  Source unclear, could be due to surgery  Hb 12.6 - 10.9 - 8.2 - 7.0 - 6.2 - PRBC - 7.5 -8.1-> 6.6  S/p PRBC transfusion  CBC monitoring  Diabetes with DKA  HgbA1c 14.3, goal < 7.0  Uncontrolled  Off insulin drip, on Levemir  Still significant hyperglycemia  CCM on board  Leukocytosis,? Sepsis  WBC 18.8 -> 26.2->25.5->24.0  On Zosyn and vanco -> change to ampicillin  Off levophed and no Neo now  CCM on  board  Hypertension  Stable Off levophed but on Neo for hypotension BP goal 120-160 Long-term BP goal normotensive  Other Stroke Risk Factors  Obesity, Body mass index is 31.73 kg/m., recommend weight loss, diet and exercise as appropriate   Other Active Problems  Induced hypernatremia  Thrombocytopenia - resolved  Hospital day # 4  This patient is critically ill due to right malignant MCA infarct status post hemicraniectomy, embolic infarcts to left MCA, PAF, diabetes and DKA, sepsis, leukocytosis, question procedure and at significant risk of neurological worsening, death form cerebral edema, ankle herniation, heart failure, status epilepticus, septic shock, DKA, hemorrhagic transformation. This patient's care requires constant monitoring of vital signs, hemodynamics, respiratory and cardiac monitoring, review of multiple databases, neurological assessment, discussion with family, other specialists and medical decision making of high complexity. I spent 40 minutes of neurocritical care time in the care of this patient.  Joe Hawking, MD PhD Stroke Neurology 04/05/2017 11:29 PM   To contact Stroke Continuity provider, please refer to [email or web address] After hours, contact General Neurology

## 2017-04-05 NOTE — Progress Notes (Signed)
MEDICATION RELATED NOTE    Pharmacy Re:  Home Meds  Medications:  Prescriptions Prior to Admission  Medication Sig Dispense Refill Last Dose  . insulin aspart (NOVOLOG) 100 UNIT/ML injection Inject 10 Units into the skin 3 (three) times daily before meals.     . insulin detemir (LEVEMIR) 100 UNIT/ML injection Inject 40 Units into the skin at bedtime.     Marland Kitchen lisinopril (PRINIVIL,ZESTRIL) 10 MG tablet Take 10 mg by mouth daily.     . metFORMIN (GLUCOPHAGE-XR) 500 MG 24 hr tablet Take 500 mg by mouth daily with breakfast.       Assessment: This 56yo male was admitted on 8/23.  We have attempted to complete his medication history several times.  His friend shared that patient had an empty Metformin bottle, and pens of Lantus, Novolog and Humalog in fridge but could not verify doses or even if the patient was actually taking the medications.  We were able to complete his history using information from RX scripts, however, we cannot confirm if the patient is actually taking these meds as prescribed.  Last fill dates were in May/June of 2017 and may not reflect an accurate history.  Plan:  -Please resume those meds you feel most appropriate for his care at this time.  I will mark his record as "meds unknown" for now.  Please contact pharmacy to review again should new information is obtained.  Rober Minion, PharmD., MS Clinical Pharmacist Pager:  [phone] Thank you for allowing pharmacy to be part of this patients care team. 04/05/2017,8:09 AM

## 2017-04-06 ENCOUNTER — Inpatient Hospital Stay (HOSPITAL_COMMUNITY): Payer: Medicaid Other

## 2017-04-06 DIAGNOSIS — D72829 Elevated white blood cell count, unspecified: Secondary | ICD-10-CM

## 2017-04-06 DIAGNOSIS — D649 Anemia, unspecified: Secondary | ICD-10-CM

## 2017-04-06 LAB — CBC
HEMATOCRIT: 21.9 % — AB (ref 39.0–52.0)
Hemoglobin: 6.7 g/dL — CL (ref 13.0–17.0)
MCH: 29.3 pg (ref 26.0–34.0)
MCHC: 30.6 g/dL (ref 30.0–36.0)
MCV: 95.6 fL (ref 78.0–100.0)
Platelets: 161 10*3/uL (ref 150–400)
RBC: 2.29 MIL/uL — ABNORMAL LOW (ref 4.22–5.81)
RDW: 22.1 % — AB (ref 11.5–15.5)
WBC: 20.1 10*3/uL — ABNORMAL HIGH (ref 4.0–10.5)

## 2017-04-06 LAB — BASIC METABOLIC PANEL
Anion gap: 4 — ABNORMAL LOW (ref 5–15)
BUN: 18 mg/dL (ref 6–20)
CALCIUM: 8.3 mg/dL — AB (ref 8.9–10.3)
CO2: 28 mmol/L (ref 22–32)
CREATININE: 0.94 mg/dL (ref 0.61–1.24)
Chloride: 122 mmol/L — ABNORMAL HIGH (ref 101–111)
GFR calc non Af Amer: >60 mL/min (ref >60)
GLUCOSE: 248 mg/dL — AB (ref 65–99)
Potassium: 3.6 mmol/L (ref 3.5–5.1)
Sodium: 154 mmol/L — ABNORMAL HIGH (ref 135–145)

## 2017-04-06 LAB — GLUCOSE, CAPILLARY
GLUCOSE-CAPILLARY: 211 mg/dL — AB (ref 65–99)
GLUCOSE-CAPILLARY: 201 mg/dL — AB (ref 65–99)
Glucose-Capillary: 220 mg/dL — ABNORMAL HIGH (ref 65–99)
Glucose-Capillary: 228 mg/dL — ABNORMAL HIGH (ref 65–99)
Glucose-Capillary: 214 mg/dL — ABNORMAL HIGH (ref 65–99)
Glucose-Capillary: 209 mg/dL — ABNORMAL HIGH (ref 65–99)

## 2017-04-06 LAB — PREPARE RBC (CROSSMATCH)

## 2017-04-06 LAB — HEMOGLOBIN AND HEMATOCRIT, BLOOD
HCT: 23.5 % — ABNORMAL LOW (ref 39.0–52.0)
HEMATOCRIT: 25.8 % — AB (ref 39.0–52.0)
HEMOGLOBIN: 7.3 g/dL — AB (ref 13.0–17.0)
Hemoglobin: 7.8 g/dL — ABNORMAL LOW (ref 13.0–17.0)

## 2017-04-06 LAB — SODIUM
SODIUM: 156 mmol/L — AB (ref 135–145)
SODIUM: 150 mmol/L — AB (ref 135–145)
SODIUM: 153 mmol/L — AB (ref 135–145)

## 2017-04-06 LAB — MAGNESIUM: Magnesium: 2.2 mg/dL (ref 1.7–2.4)

## 2017-04-06 LAB — PHOSPHORUS: PHOSPHORUS: 3.5 mg/dL (ref 2.5–4.6)

## 2017-04-06 MED ORDER — ALTEPLASE 2 MG IJ SOLR
2.0000 mg | Freq: Once | INTRAMUSCULAR | Status: AC
  Administered 2017-04-06: 2 mg

## 2017-04-06 MED ORDER — VITAL AF 1.2 CAL PO LIQD
1000.0000 mL | ORAL | Status: DC
  Administered 2017-04-07: 1000 mL

## 2017-04-06 MED ORDER — SODIUM CHLORIDE 0.9 % IV SOLN
Freq: Once | INTRAVENOUS | Status: AC
  Administered 2017-04-13: 22:00:00 via INTRAVENOUS

## 2017-04-06 NOTE — Plan of Care (Signed)
Problem: Education: Goal: Knowledge of disease or condition will improve Outcome: Not Progressing Pt is currently intubated and sedated

## 2017-04-06 NOTE — Progress Notes (Signed)
[facility] Progress Note Patient Name: Joe Wilkinson DOB: 1960/12/01 MRN: [id number]   Date of Service  04/06/2017  HPI/Events of Note  Hgb 6.7  eICU Interventions  One unit RBCs ordered     Intervention Category Major Interventions: Other:  [name] 04/06/2017, 6:27 AM

## 2017-04-06 NOTE — Progress Notes (Signed)
STROKE TEAM PROGRESS NOTE   SUBJECTIVE (INTERVAL HISTORY) The patient remains intubated. More responsive than yesterday, following commands on the right. Hb again drop to 6.7, even with PRBC transfusion yesterday. Pending another PRBC transfusion.  BP improved on Neo. CT abdomen done showed no retroperitoneal hematoma   OBJECTIVE Temp:  [98.9 F (37.2 C)-100.2 F (37.9 C)] 99.6 F (37.6 C) (08/30 0836) Pulse Rate:  [70-89] 79 (08/30 1000) Cardiac Rhythm: Normal sinus rhythm (08/30 0400) Resp:  [15-25] 25 (08/30 1148) BP: (108-142)/(68-91) 124/81 (08/30 1000) SpO2:  [94 %-100 %] 95 % (08/30 1148) FiO2 (%):  [40 %] 40 % (08/30 1148) Weight:  [227 lb 1.2 oz (103 kg)] 227 lb 1.2 oz (103 kg) (08/30 0349)  CBC:   Recent Labs Lab 04/01/17 0225  04/05/17 0717  04/06/17 0512 04/06/17 1031  WBC 22.2*  < > 24.0*  --  20.1*  --   NEUTROABS 15.1*  --   --   --   --   --   HGB 10.9*  < > 6.6*  < > 6.7* 7.8*  HCT 32.9*  < > 22.8*  < > 21.9* 25.8*  MCV 77.6*  < > 95.0  --  95.6  --   PLT 123*  < > 173  --  161  --   < > = values in this interval not displayed.  Basic Metabolic Panel:   Recent Labs Lab 04/05/17 0423  04/06/17 0026 04/06/17 0512  NA 158*  < > 156* 154*  K 3.5  --   --  3.6  CL 123*  --   --  122*  CO2 28  --   --  28  GLUCOSE 206*  --   --  248*  BUN 16  --   --  18  CREATININE 0.91  --   --  0.94  CALCIUM 8.3*  --   --  8.3*  MG 2.0  --   --  2.2  PHOS 3.3  --   --  3.5  < > = values in this interval not displayed.  Lipid Panel:     Component Value Date/Time   CHOL 93 04/02/2017 0607   TRIG 135 04/02/2017 0607   HDL 28 (L) 04/02/2017 0607   CHOLHDL 3.3 04/02/2017 0607   VLDL 27 04/02/2017 0607   LDLCALC 38 04/02/2017 0607   HgbA1c:  Lab Results  Component Value Date   HGBA1C 14.3 (H) 04/02/2017   Urine Drug Screen: No results found for: LABOPIA, COCAINSCRNUR, LABBENZ, AMPHETMU, THCU, LABBARB  Alcohol Level No results found for: [place] I  have personally reviewed the radiological images below and agree with the radiology interpretations.  Ct Head Wo Contrast 03/31/2017 1. Progressive large right MCA territory infarct with involvement of the right occipital lobe likely related to a fetal type right posterior cerebral artery.  2. Diffuse edema and mass effect with 12 mm midline shift and partial effacement of the para mesencephalic cisterns.  3. Developing left posterior watershed territory infarct.  4. Nasal fluid and bilateral sinus disease likely related to intubation.   Ct Head Wo Contrast 03/30/2017 No acute intracranial abnormality identified. Unremarkable CT of the brain.   Ct Angio Head and neck W Or Wo Contrast 04/02/2017 IMPRESSION: 1. Occlusion of the right middle cerebral artery proximal M1 segment with extensive cytotoxic edema throughout the entire right MCA distribution. 2. Status post right pterional craniectomy for decompression with unchanged leftward midline shift measuring 8 mm.  3. No hemorrhage. 4. Normal carotid arteries.  No embolic source identified.   LE venous doppler - No evidence of deep vein thrombosis or baker's cysts bilaterally.  EEG - This EEG is abnormal due to bilateral slowing with suppression of the right hemisphere. Clinical Correlation of the above findings indicates bilateral cerebral dysfunction that is non-specific in etiology and can be seen with hypoxic/ischemic injury, toxic/metabolic encephalopathies, neurodegenerative disorders, or medication effect.  The absence of epileptiform discharges does not rule out a clinical diagnosis of epilepsy.  Clinical correlation is advised.  TTE Left ventricle: The cavity size was normal. There was severe   concentric hypertrophy. Systolic function was vigorous. The   estimated ejection fraction was in the range of 65% to 70%. Wall   motion was normal; there were no regional wall motion   abnormalities. Left ventricular diastolic function  parameters   were normal. - Atrial septum: There was increased thickness of the septum,   consistent with lipomatous hypertrophy. - Pulmonary arteries: Systolic pressure could not be accurately   estimated.  Ct Head Wo Contrast 04/05/2017 IMPRESSION: 1. Evolving acute extensive RIGHT cerebrum infarcts including RIGHT MCA, to lesser extent RIGHT ACA and RIGHT PCA territories. Petechial hemorrhage without hemorrhagic conversion. 2. Similar 7 mm RIGHT to LEFT midline shift without ventricular entrapment. Similar external herniation via RIGHT craniectomy defect. 3. Similar acute small LEFT parietal lobe infarct.   Ct Abdomen Pelvis Wo Contrast 04/06/2017 IMPRESSION: No evidence of retroperitoneal hematoma. Diffuse mesenteric and retroperitoneal soft tissue stranding and body wall edema, consistent with third spacing. No evidence of focal inflammatory process or abscess. Tiny left pleural effusion and bibasilar atelectasis noted. Indeterminate 3.5 cm low-attenuation lesion in left hepatic lobe. Consider further characterization with hepatic protocol abdomen CT with contrast (or MRI if patient can cooperate with breath-holding).    PHYSICAL EXAM   Vitals:   04/06/17 0836 04/06/17 0900 04/06/17 1000 04/06/17 1148  BP: 133/81 132/87 124/81   Pulse: 83 70 79   Resp: 17 16 17  (!) 25  Temp: 99.6 F (37.6 C)     TempSrc: Axillary     SpO2: 97% 98% 100% 95%  Weight:      Height:       Middle-age African-American male who is sedated and intubated. Craniotomy bandage on the right skull. Afebrile. Head is nontraumatic. Cardiac exam no murmur or gallop. Distal pulses are well felt. Neurological Exam :  Still intubated, eyes open on voice. Follows simple commands on the right UE and LE. Right gaze preference, not able to cross midline to the left. Pupils 3 mm reactive to light. Fundi could not be visualized. Corneal, gag and cough are present. Patient is able to move right upper and lower extremity  spontaneously and against gravity with brisk withdrawal response to pain. Dense left hemiplegia with decreased tone. Reflexes present on the right and absent on the left. Left plantar upgoing right equivocal. Sensation, coordination and gait not tested.   ASSESSMENT/PLAN Joe Wilkinson is a 56 y.o. male with history of diabetes mellitus (other past medical history unknown at this time) presenting with unresponsiveness. He did not receive IV t-PA due to decompressive right frontotemporoparietal craniectomy for decompression.  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 S/p right decompressive hemicraniectomy.   Resultant  left hemiplegia and right gaze   CT head - Progressive large right MCA territory infarct with involvement of the right occipital lobe likely related  to a fetal type right PCA. Diffuse edema and mass effect. Developing left posterior watershed territory infarct.   CTA head and neck - right M1 cut off, b/l fetal PCAs with dimunitive BA and VAs  MRI head - consider once stabilized  2D Echo - EF 65-70%  EEG no seizure  LDL - 38   HgbA1c - 14.3  VTE prophylaxis - SCDs (not on lovenox due to critical anemia needed repeated PRBC) Diet NPO time specified  No antithrombotic prior to admission, now on ASA 325mg   Patient will be counseled to be compliant with his antithrombotic medications  Ongoing aggressive stroke risk factor management  Therapy recommendations:  pending  Disposition:  Pending  Cerebral edema  CT head large right MCA infarct with diffuse edema and midline shift  Status post decompressive hemicraniectomy  Neurosurgery on board  Repeat CT evolving right malignant MCA, stable   Sodium 158->155->158->154  Sodium goal of 150-160, continue monitoring Q6h  PAF - could be potential source of current stroke  Confirmed with EKG  HR can go up to 140-150s  Reversed by  amiodarone  Continue on amiodarone  CCM on board  Acute blood loss anemia  Source unclear, could be due to surgery  Hb 12.6 - 10.9 - 8.2 - 7.0 - 6.2 - PRBC - 7.5 -8.1-> 6.6->PRBC->7.2->6.7-> PRBC->7.8  S/p repeated PRBC transfusion  CT abdomen - no retroperitoneal hematoma  No bloody or dark bowel movement  Stool occult blood pending  CBC monitoring  Diabetes with DKA  HgbA1c 14.3, goal < 7.0  Uncontrolled  Off insulin drip, on Levemir  Still significant hyperglycemia  DM nurse on board, appreciate help  CCM on board  Leukocytosis,? Sepsis  WBC 18.8 -> 26.2->25.5->24.0->20.1  On Zosyn and vanco -> change to ampicillin  no Neo now  CCM on board  Hypertension  Stable on Neo for hypotension BP goal 120-160 Long-term BP goal normotensive  Other Stroke Risk Factors  Obesity, Body mass index is 32.58 kg/m., recommend weight loss, diet and exercise as appropriate   Other Active Problems  Induced hypernatremia  Thrombocytopenia - resolved  TF @ [facility] day # 5  This patient is critically ill due to right malignant MCA infarct status post hemicraniectomy, embolic infarcts to left MCA, PAF, diabetes and DKA, sepsis, leukocytosis, question procedure and at significant risk of neurological worsening, death form cerebral edema, ankle herniation, heart failure, status epilepticus, septic shock, DKA, hemorrhagic transformation. This patient's care requires constant monitoring of vital signs, hemodynamics, respiratory and cardiac monitoring, review of multiple databases, neurological assessment, discussion with family, other specialists and medical decision making of high complexity. I spent 35 minutes of neurocritical care time in the care of this patient.  Rosalin Hawking, MD PhD Stroke Neurology 04/06/2017 1:19 PM   To contact Stroke Continuity provider, please refer to [email or web address] After hours, contact General Neurology

## 2017-04-06 NOTE — Progress Notes (Signed)
Inpatient Diabetes Program Recommendations  AACE/ADA: New Consensus Statement on Inpatient Glycemic Control (2015)  Target Ranges:  Prepandial:   less than 140 mg/dL      Peak postprandial:   less than 180 mg/dL (1-2 hours)      Critically ill patients:  140 - 180 mg/dL    Review of Glycemic Control  Inpatient Diabetes Program Recommendations:    Consider increasing Tube Feed coverage to Novolog 6 units Q4 hours. Patient is receiving 10 units consistently and glucose remains in the 200's.  Thanks,  Tama Headings RN, MSN, [facility] Inpatient Diabetes Coordinator Team Pager [phone] (8a-5p)

## 2017-04-06 NOTE — Progress Notes (Signed)
Patient transported to CT and back to 4N25. Vital signs were stable throughout the trip and patient tolerated well.

## 2017-04-06 NOTE — Progress Notes (Signed)
Received message that patient's aunt Kelby Fam called and has questions for CM/CSW.  Called her back at [phone], with no answer.  Unable to leave a message, as voicemail not set up.  Will try back.    Reinaldo Raddle, RN, BSN  Trauma/Neuro ICU Case Manager [phone]

## 2017-04-06 NOTE — Progress Notes (Signed)
Nutrition Follow-up  INTERVENTION:   Increase Vital 1.2 AF to 75 ml/hr (1800 ml/day) Provides: 2160 kcal, 135 grams protein, 198 grams carbohydrate, and 1459 ml free water   NUTRITION DIAGNOSIS:   Inadequate oral intake related to inability to eat as evidenced by NPO status. Ongoing.   GOAL:   Patient will meet greater than or equal to 90% of their needs Met.   MONITOR:   Vent status, Labs, Weight trends, I & O's, TF tolerance  ASSESSMENT:   Pt admitted to [facility] with AMS from DKA and sepsis, found to have R MCA CVA with 12 mm midline shift and transferred to [facility], required R decompressive craniectomy.    Pt discussed during ICU rounds and with RN.  Pt has required multiple units of blood, no retroperitoneal bleeding per CT  Patient remains intubated on ventilator support MV: 14.1 L/min Temp (24hrs), Avg:99.4 F (37.4 C), Min:98.9 F (37.2 C), Max:100.2 F (37.9 C)  Medications reviewed and include: colace, SSI, 2 units novolog every 4 hours, 30 u levemir daily, senokot, 3% saline off Neo-synephrine for pressor support, MAP 82, weaning down  Labs reviewed: Na 154 (H) - induced by 3%, hemoglobin 7.8 (L) CBG's: 228-214 - insulin being added for TF coverage  Once pressors off consider changing TF to fiber containing formula  TF: Vital AF 1.2 @ 70 ml/hr:  2016 kcals, 126 gm protein, 1362 ml free water daily  Diet Order:  Diet NPO time specified  Skin:   (Surgical incision to R skull)  Last BM:  Unknown  Height:   Ht Readings from Last 1 Encounters:  04/01/17 5' 10" (1.778 m)    Weight:   Wt Readings from Last 1 Encounters:  04/06/17 227 lb 1.2 oz (103 kg)    Ideal Body Weight:  75.45 kg  BMI:  Body mass index is 32.58 kg/m.  Estimated Nutritional Needs:   Kcal:  2250  Protein:  120-135kcals/day  Fluid:  Per MD  EDUCATION NEEDS:   No education needs identified at this time  [place], [place], [place] Pager [phone] After Hours  Pager

## 2017-04-06 NOTE — Discharge Summary (Signed)
   Patient had a massive stroke, stroke Code was called and the patient  was transferred to [facility] for further management.  Spoke with neurosurgeon and rest of the management is per the primary team.  Informed Dr. Jefferson Fuel about the transfer.  Please refer to the notes.  [facility]

## 2017-04-06 NOTE — Progress Notes (Signed)
PULMONARY / CRITICAL CARE MEDICINE   Name: Joe Wilkinson MRN: [id number] DOB: 1960/08/28    ADMISSION DATE:  03/31/2017 CHIEF COMPLAINT:  CVA  HISTORY OF PRESENT ILLNESS:   56 yo male brought to [facility] with altered mental status from DKA and sepsis.  Found to have RT MCA CVA and transfer to [facility].  Required Rt decompressive craniectomy and remained on vent post op.  SUBJECTIVE:  Getting another PRBC transfusion this AM.  VITAL SIGNS: BP 135/81   Pulse 81   Temp 99.2 F (37.3 C) (Axillary)   Resp 16   Ht 5\' 10"  (1.778 m)   Wt 227 lb 1.2 oz (103 kg)   SpO2 97%   BMI 32.58 kg/m   VENTILATOR SETTINGS: Vent Mode: PRVC FiO2 (%):  [40 %] 40 % Set Rate:  [16 bmp] 16 bmp Vt Set:  [550 mL] 550 mL PEEP:  [5 cmH20] 5 cmH20 Plateau Pressure:  [12 cmH20-19 cmH20] 19 cmH20  INTAKE / OUTPUT: I/O last 3 completed shifts: In: 7994.7 [I.V.:4968.9; Blood:275.8; NG/GT:2450; IV Piggyback:300] Out: 3560 [Urine:3560]  PHYSICAL EXAMINATION:  General - follows simple commands Eyes - pupils reactive ENT - ETT in place Cardiac - regular, no murmur Chest - no wheeze, rales Abd - soft, mild distention, non tender, brown semi-formed stool Ext - no edema Skin - no rashes Neuro - moves Rt side   BMET  Recent Labs Lab 04/04/17 0441  04/05/17 0423  04/05/17 1822 04/06/17 0026 04/06/17 0512  NA 155*  < > 158*  < > 156* 156* 154*  K 5.6*  --  3.5  --   --   --  3.6  CL 123*  --  123*  --   --   --  122*  CO2 26  --  28  --   --   --  28  BUN 19  --  16  --   --   --  18  CREATININE 0.90  --  0.91  --   --   --  0.94  GLUCOSE 340*  --  206*  --   --   --  248*  < > = values in this interval not displayed.  Electrolytes  Recent Labs Lab 04/04/17 0441 04/05/17 0423 04/06/17 0512  CALCIUM 8.4* 8.3* 8.3*  MG 2.2 2.0 2.2  PHOS 2.8 3.3 3.5    CBC  Recent Labs Lab 04/04/17 0441  04/05/17 0717 04/05/17 1429 04/06/17 0512  WBC 25.5*  --  24.0*  --  PENDING  HGB 7.0*  < > 6.6*  7.2* 6.7*  HCT 22.8*  < > 22.8* 23.9* 21.9*  PLT 184  --  173  --  PENDING  < > = values in this interval not displayed.  Coag's  Recent Labs Lab 03/31/17 1114 04/01/17 0225  APTT 33 31  INR 1.12  --     Sepsis Markers  Recent Labs Lab 03/31/17 1114 04/01/17 0225  PROCALCITON 4.28 2.53    ABG  Recent Labs Lab 03/31/17 0542 04/01/17 0250 04/01/17 0904  PHART 7.44 7.483* 7.402  PCO2ART 36 28.3* 31.3*  PO2ART 148* 136* 353.0*    Liver Enzymes  Recent Labs Lab 03/30/17 1808 04/01/17 0225 04/04/17 0148  AST 23 40 33  ALT 14* 13* 13*  ALKPHOS 94 58 59  BILITOT 2.3* 1.7* 1.3*  ALBUMIN 3.3* 2.2* 1.7*    Cardiac Enzymes  Recent Labs Lab 04/04/17 0148 04/04/17 0932 04/04/17 1500  TROPONINI 0.04* 0.03* 0.04*  Glucose  Recent Labs Lab 04/05/17 0819 04/05/17 1109 04/05/17 1553 04/05/17 2002 04/05/17 2341 04/06/17 0355  GLUCAP 210* 206* 227* 243* 238* 220*    Imaging Dg Chest Port 1 View  Result Date: 04/06/2017 CLINICAL DATA:  Ventilator dependent EXAM: PORTABLE CHEST 1 VIEW COMPARISON:  Yesterday FINDINGS: Endotracheal tube tip is just above the clavicular heads. Consider advancement by ~2 cm. An orogastric tube reaches the stomach. Left IJ central line with tip near the brachiocephalic SVC junction. Low volume chest with streaky perihilar densities. Normal heart size for technique. No edema, effusion, or pneumothorax. Artifact from overlapping hardware. IMPRESSION: 1. Mildly high endotracheal tube, tip at the level of the T1-2 interspace. Consider advancement by 2 cm. 2. Stable positioning of central line and orogastric tube. 3. Low volume chest with atelectatic type opacities. Electronically Signed   By: Monte Fantasia M.D.   On: 04/06/2017 07:22     STUDIES:  LP 8/24 >> glucose 205, RBC 16, protein 62, WBC 5 CT head 8/24 >> Large Rt MCA EEG 8/27 >> bilateral cerebral dysfunction, non-specific in etiology Echo 8/27 >> EF 65 to  70%  CULTURES: CSF 8/24>> negative BCX 8/24>> negative UCX 8/24>> negative Sputum 8/24>> S. agalactiae  ANTIBIOTICS: vanco 8/24 >> 8/26 Zosyn 8/24 >> 8/26 Ampicillin 8/27 >   SIGNIFICANT EVENTS: 8/23 Admit [facility] 8/23 8/24 Transfer to [facility] after Rt MCA CVA, start 3% NS 8/25 Decompressive craniectomy 8/27 Transfuse 1 unit PRBC 8/28 Transfuse 1 unit PRBC, A fib with RVR 8/30 Transfuse 1 unit PRBC  LINES/TUBES: ETT 8/23 >> Lt IJ CVL 8/23 >>  ASSESSMENT / PLAN:  PULMONARY A: Compromised airway. P:   Pressure support wean as able Not ready for extubation yet due to other medical issues F/u CXR  CARDIOVASCULAR A:  Septic shock. A fib with RVR >> back in sinus rhythm. P:  Prn lopressor IV for HR > 120 Continue amiodarone for now Pressors to keep MAP > 65 and SBP > 120  RENAL A:   Medically induced hypernatremia. P:  Goal Na 150 to 155 per neurology  GASTROINTESTINAL A:   Nutrition. P:   Tube feeds  HEMATOLOGIC A:   Persistent anemia >> no evidence for GI bleeding; ?retroperitoneal bleeding. P:  F/u Hb after PRBC transfusion 8/30 F/u CT abd/pelvis with contrast  INFECTIOUS A:   Sepsis - sputum culture positive for S. Agalactiae. P:   Day 7/7 of Abx >> d/c ampicillin after dose on 8/30  ENDOCRINE A:   DKA - resolved. DM type I. P:   SSI with tube feed coverage Levemir 30 units daily  NEUROLOGIC A:   Large right MCA CVA with edema and 40mm MLS. S/p decompressive craniectomy. P:   RASS goal 0 to -1 Neurosurgery s/o 04/02/17 >> recommend d/cing sutures 10 to 14 days after surgery, and f/u with neurosurgery in 3 months to assess for cranioplasty Continue ASA for now >> if evidence for bleeding on CT abd/pelvis then will need hold ASA  CC time 33 minutes  Chesley Mires, MD [place] 04/06/2017, 8:20 AM Pager:  [phone] After 3pm call: [phone]

## 2017-04-07 ENCOUNTER — Inpatient Hospital Stay (HOSPITAL_COMMUNITY): Payer: Medicaid Other

## 2017-04-07 DIAGNOSIS — J988 Other specified respiratory disorders: Secondary | ICD-10-CM

## 2017-04-07 LAB — GLUCOSE, CAPILLARY
GLUCOSE-CAPILLARY: 216 mg/dL — AB (ref 65–99)
GLUCOSE-CAPILLARY: 219 mg/dL — AB (ref 65–99)
GLUCOSE-CAPILLARY: 233 mg/dL — AB (ref 65–99)
Glucose-Capillary: 229 mg/dL — ABNORMAL HIGH (ref 65–99)
Glucose-Capillary: 213 mg/dL — ABNORMAL HIGH (ref 65–99)
Glucose-Capillary: 209 mg/dL — ABNORMAL HIGH (ref 65–99)

## 2017-04-07 LAB — HEPATIC FUNCTION PANEL
ALT: 15 U/L — AB (ref 17–63)
AST: 22 U/L (ref 15–41)
Albumin: 1.3 g/dL — ABNORMAL LOW (ref 3.5–5.0)
Alkaline Phosphatase: 51 U/L (ref 38–126)
Total Bilirubin: 0.6 mg/dL (ref 0.3–1.2)
Total Protein: 5.7 g/dL — ABNORMAL LOW (ref 6.5–8.1)

## 2017-04-07 LAB — BASIC METABOLIC PANEL
Anion gap: 3 — ABNORMAL LOW (ref 5–15)
BUN: 18 mg/dL (ref 6–20)
CHLORIDE: 117 mmol/L — AB (ref 101–111)
CO2: 30 mmol/L (ref 22–32)
CREATININE: 0.91 mg/dL (ref 0.61–1.24)
Calcium: 8.5 mg/dL — ABNORMAL LOW (ref 8.9–10.3)
GFR calc Af Amer: >60 mL/min (ref >60)
GFR calc non Af Amer: >60 mL/min (ref >60)
Glucose, Bld: 254 mg/dL — ABNORMAL HIGH (ref 65–99)
Potassium: 3.8 mmol/L (ref 3.5–5.1)
Sodium: 150 mmol/L — ABNORMAL HIGH (ref 135–145)

## 2017-04-07 LAB — CBC
HCT: 23 % — ABNORMAL LOW (ref 39.0–52.0)
Hemoglobin: 7 g/dL — ABNORMAL LOW (ref 13.0–17.0)
MCH: 29 pg (ref 26.0–34.0)
MCHC: 30.4 g/dL (ref 30.0–36.0)
MCV: 95.4 fL (ref 78.0–100.0)
PLATELETS: 160 10*3/uL (ref 150–400)
RBC: 2.41 MIL/uL — AB (ref 4.22–5.81)
RDW: 20.9 % — ABNORMAL HIGH (ref 11.5–15.5)
WBC: 17.3 10*3/uL — ABNORMAL HIGH (ref 4.0–10.5)

## 2017-04-07 LAB — PREPARE RBC (CROSSMATCH)

## 2017-04-07 LAB — HEMOGLOBIN AND HEMATOCRIT, BLOOD
HCT: 22.2 % — ABNORMAL LOW (ref 39.0–52.0)
HEMATOCRIT: 25.2 % — AB (ref 39.0–52.0)
Hemoglobin: 6.6 g/dL — CL (ref 13.0–17.0)
Hemoglobin: 7.7 g/dL — ABNORMAL LOW (ref 13.0–17.0)

## 2017-04-07 LAB — SODIUM
SODIUM: 150 mmol/L — AB (ref 135–145)
Sodium: 150 mmol/L — ABNORMAL HIGH (ref 135–145)
Sodium: 148 mmol/L — ABNORMAL HIGH (ref 135–145)
Sodium: 150 mmol/L — ABNORMAL HIGH (ref 135–145)

## 2017-04-07 LAB — LACTATE DEHYDROGENASE: LDH: 340 U/L — AB (ref 98–192)

## 2017-04-07 MED ORDER — MIDAZOLAM HCL 2 MG/2ML IJ SOLN
2.0000 mg | INTRAMUSCULAR | Status: DC | PRN
  Administered 2017-04-08 – 2017-04-10 (×2): 2 mg via INTRAVENOUS
  Filled 2017-04-07 (×5): qty 2

## 2017-04-07 MED ORDER — VITAL HIGH PROTEIN PO LIQD: 1000.0000 mL | ORAL | Status: DC

## 2017-04-07 MED ORDER — ORAL CARE MOUTH RINSE
15.0000 mL | OROMUCOSAL | Status: DC
  Administered 2017-04-07 – 2017-04-16 (×82): 15 mL via OROMUCOSAL

## 2017-04-07 MED ORDER — BISACODYL 10 MG RE SUPP: 10.0000 mg | Freq: Every day | RECTAL | Status: DC | PRN

## 2017-04-07 MED ORDER — MIDAZOLAM HCL 2 MG/2ML IJ SOLN
INTRAMUSCULAR | Status: AC
  Administered 2017-04-07: 2 mg via INTRAVENOUS
  Filled 2017-04-07: qty 2

## 2017-04-07 MED ORDER — ASPIRIN 325 MG PO TABS
325.0000 mg | ORAL_TABLET | Freq: Every day | ORAL | Status: DC
  Administered 2017-04-07 – 2017-04-12 (×6): 325 mg via ORAL
  Filled 2017-04-07 (×7): qty 1

## 2017-04-07 MED ORDER — ROCURONIUM BROMIDE 50 MG/5ML IV SOLN
80.0000 mg | Freq: Once | INTRAVENOUS | Status: AC
  Administered 2017-04-07: 80 mg via INTRAVENOUS

## 2017-04-07 MED ORDER — PRO-STAT SUGAR FREE PO LIQD
30.0000 mL | Freq: Two times a day (BID) | ORAL | Status: DC
  Administered 2017-04-07: 30 mL
  Filled 2017-04-07: qty 30

## 2017-04-07 MED ORDER — MIDAZOLAM HCL 2 MG/2ML IJ SOLN
2.0000 mg | Freq: Once | INTRAMUSCULAR | Status: AC
  Administered 2017-04-07: 2 mg via INTRAVENOUS

## 2017-04-07 MED ORDER — FENTANYL 2500MCG IN NS 250ML (10MCG/ML) PREMIX INFUSION
25.0000 ug/h | INTRAVENOUS | Status: DC
  Administered 2017-04-07: 50 ug/h via INTRAVENOUS
  Administered 2017-04-08 – 2017-04-09 (×2): 150 ug/h via INTRAVENOUS
  Administered 2017-04-11 – 2017-04-12 (×2): 50 ug/h via INTRAVENOUS
  Filled 2017-04-07 (×7): qty 250

## 2017-04-07 MED ORDER — VITAL AF 1.2 CAL PO LIQD
1000.0000 mL | ORAL | Status: DC
  Administered 2017-04-07 – 2017-04-11 (×9): 1000 mL

## 2017-04-07 MED ORDER — CHLORHEXIDINE GLUCONATE 0.12% ORAL RINSE (MEDLINE KIT)
15.0000 mL | Freq: Two times a day (BID) | OROMUCOSAL | Status: DC
  Administered 2017-04-07 – 2017-04-15 (×17): 15 mL via OROMUCOSAL

## 2017-04-07 MED ORDER — SODIUM CHLORIDE 0.9 % IV SOLN
Freq: Once | INTRAVENOUS | Status: AC
  Administered 2017-04-07: 19:00:00 via INTRAVENOUS

## 2017-04-07 MED ORDER — ORAL CARE MOUTH RINSE
15.0000 mL | Freq: Four times a day (QID) | OROMUCOSAL | Status: DC
  Administered 2017-04-07: 15 mL via OROMUCOSAL

## 2017-04-07 MED ORDER — FENTANYL BOLUS VIA INFUSION
50.0000 ug | INTRAVENOUS | Status: DC | PRN
  Administered 2017-04-07 – 2017-04-08 (×2): 100 ug via INTRAVENOUS
  Administered 2017-04-11: 50 ug via INTRAVENOUS
  Filled 2017-04-07: qty 50

## 2017-04-07 MED ORDER — ETOMIDATE 2 MG/ML IV SOLN
20.0000 mg | Freq: Once | INTRAVENOUS | Status: AC
  Administered 2017-04-07: 20 mg via INTRAVENOUS

## 2017-04-07 MED ORDER — DOCUSATE SODIUM 50 MG/5ML PO LIQD
100.0000 mg | Freq: Two times a day (BID) | ORAL | Status: DC | PRN
  Administered 2017-04-12: 100 mg
  Filled 2017-04-07: qty 10

## 2017-04-07 MED ORDER — INSULIN ASPART 100 UNIT/ML ~~LOC~~ SOLN
3.0000 [IU] | SUBCUTANEOUS | Status: DC
  Administered 2017-04-07 – 2017-04-19 (×62): 3 [IU] via SUBCUTANEOUS

## 2017-04-07 MED ORDER — MIDAZOLAM HCL 2 MG/2ML IJ SOLN
INTRAMUSCULAR | Status: AC
  Filled 2017-04-07: qty 2

## 2017-04-07 MED ORDER — PANTOPRAZOLE SODIUM 40 MG IV SOLR
40.0000 mg | INTRAVENOUS | Status: DC
  Administered 2017-04-07 – 2017-04-09 (×3): 40 mg via INTRAVENOUS
  Filled 2017-04-07 (×3): qty 40

## 2017-04-07 NOTE — Procedures (Signed)
Intubation Procedure Note Joe Wilkinson [id number] 1961-08-02  Procedure: Intubation Indications: Airway protection and maintenance  Procedure Details Time Out: Verified patient identification, verified procedure, site/side was marked, verified correct patient position, special equipment/implants available, medications/allergies/relevent history reviewed, required imaging and test results available.  Performed  Given 2 mg versed, 100 mcg fentanyl, 20 mg etomidate, 80 mcg rocuronium.  Initially tried using portable glide scope >> terrible visualization.  Had transient drop in SpO2.  Bag ventilation back up to 100% SpO2.  Then used 4 Mac laryngoscope.  Inserted 7.5 ETT to 24 cm at lip.  Confirmed with CO2 detector and ausculation.  Joe Mires, MD [facility] Pulmonary/Critical Care 04/07/2017, 9:27 AM Pager:  [phone] After 3pm call: [phone]

## 2017-04-07 NOTE — Progress Notes (Addendum)
Spoke with patient's aunt, Kelby Fam, at her request, [phone]) by phone.  She states pt has a significant other in [place] who does not drive, and she is inquiring if our facility has any volunteer transportation service to assist her in getting to [place].  Unfortunately, we do not.  She also inquired about "long term rehab" that pt will need at discharge, and how to get him to [place] with his sister.  Explained that I have spoken with his sister, Geni Bers, and until pt is able to work with therapies, we do not know what type of facility he will need.  Pt was reintubated last evening, and may need tracheostomy.  Aunt states pt has no children, spouse, or living parents.  She asked about getting POA for pt, and I explained that sister would be the decision maker, per [place] law.  Ms. Chana Bode was appreciative of my call.  Will continue to work with family on discharge planning.    Reinaldo Raddle, RN, BSN  Trauma/Neuro ICU Case Manager [phone]

## 2017-04-07 NOTE — Progress Notes (Signed)
Pt extubated earlier this morning.  No cough, no gag, pooling secretions with gurgling respiration.  Reintubated.  Likely will need trach.  Chesley Mires, MD [facility] Pulmonary/Critical Care 04/07/2017, 9:28 AM Pager:  [phone] After 3pm call: [phone]

## 2017-04-07 NOTE — Progress Notes (Signed)
Pt self extubated. Pt suctioned, OG tube pulled out and pt placed on NRB.  Respiratory and [facility] Dr. Elsworth Soho notified.  O2 sat 100% on NRB, but pt has stridor and gurgling.  No gag or cough reflex upon suction.  Dr. Elsworth Soho camera in and will observe and reintubate if needed.

## 2017-04-07 NOTE — Progress Notes (Addendum)
Inpatient Diabetes Program Recommendations  AACE/ADA: New Consensus Statement on Inpatient Glycemic Control (2015)  Target Ranges:  Prepandial:   less than 140 mg/dL      Peak postprandial:   less than 180 mg/dL (1-2 hours)      Critically ill patients:  140 - 180 mg/dL   Results for TAJAI, IHDE (MRN [id number]) as of 04/07/2017 11:50  Ref. Range 04/06/2017 23:12 04/07/2017 03:54 04/07/2017 07:38 04/07/2017 11:25  Glucose-Capillary Latest Ref Range: 65 - 99 mg/dL 209 (H)  10 units Novolog total 229 (H)  10 units Novolog total 216 (H)  10 units Novolog total 213 (H)    Home DM Meds: Levemir 40 units QHS       Novolog 10 units TID       Metformin 500 mg daily  Current Insulin Orders: Levemir 30 units daily      Novolog Resistant Correction Scale/ SSI (0-20 units) Q4 hours      Novolog 3 units Q4 hours      MD- Please consider the following in-hospital insulin adjustments:  1. Increase Levemir to 35 units daily  2. Increase Novolog tube coverage to: Novolog 5 units Q4 hours (hold if tube feeds held for any reason)     --Will follow patient during hospitalization--  Wyn Quaker RN, MSN, CDE Diabetes Coordinator Inpatient Glycemic Control Team Team Pager: [phone] (8a-5p)

## 2017-04-07 NOTE — Progress Notes (Signed)
Patients ETT out to 20 at the lip. RT deflated cuff and pushed into 24 at the lip and inflated cuff. Patient tolerated well and had return volumes.

## 2017-04-07 NOTE — Progress Notes (Signed)
[place] Progress Note Patient Name: Joe Wilkinson DOB: Feb 09, 1961 MRN: [id number]   Date of Service  04/07/2017  HPI/Events of Note  Notified by bedside nurse. Hemoglobin 6.6. LDH haptoglobin and LFTs all obtained posttransfusion therefore will be inaccurate. Total bilirubin only 0.6 arguing against hemolysis. No evidence of bone marrow suppression. Question possibly secondary to phlebotomy versus dilution given patient is 12 L positive per documentation (weight increasing as well). No evidence of hemorrhage on CT abdomen and pelvis. No obvious gastrointestinal hemorrhage per documentation.   eICU Interventions  1. Transfusing 1 unit packed red blood cells 2. Repeat hemoglobin/hematocrit posttransfusion to ensure appropriate response      Intervention Category Major Interventions: Hemorrhage - evaluation and management  Tera Partridge 04/07/2017, 6:02 PM

## 2017-04-07 NOTE — Progress Notes (Signed)
Brief Nutrition Note  Pt discussed during ICU rounds and with RN.  Pt extubated and re-intubated this am.  Consult received to re-start tube feedings.  Reviewed orders, will resume previous TF.   [place], [place], [place] Pager [phone] After Hours Pager

## 2017-04-07 NOTE — Procedures (Signed)
Extubation Procedure Note  Patient Details:   Name: Montrail Mehrer DOB: [birth year] MRN: [id number]   Airway Documentation:     Evaluation  O2 sats: stable throughout Complications: No apparent complications Patient did not tolerate procedure well. Bilateral Breath Sounds: Rhonchi   No   No cuff leak noted. MD notified.  Pt not speaking or coughing.  Pt not able to clear secretions.  MD notified.  Setting up to [place] 04/07/2017, 9:35 AM

## 2017-04-07 NOTE — Progress Notes (Signed)
PULMONARY / CRITICAL CARE MEDICINE   Name: Houa Ackert MRN: [id number] DOB: 07/15/1961    ADMISSION DATE:  03/31/2017 CHIEF COMPLAINT:  CVA  HISTORY OF PRESENT ILLNESS:   56 yo male brought to [facility] with altered mental status from DKA and sepsis.  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  Required Rt decompressive craniectomy and remained on vent post op.  SUBJECTIVE:  Tolerating SBT.  VITAL SIGNS: BP (!) 116/54   Pulse 76   Temp 98.8 F (37.1 C) (Axillary)   Resp 16   Ht 5\' 10"  (1.778 m)   Wt 227 lb 1.2 oz (103 kg)   SpO2 94%   BMI 32.58 kg/m   VENTILATOR SETTINGS: Vent Mode: PSV;CPAP FiO2 (%):  [40 %] 40 % Set Rate:  [16 bmp] 16 bmp Vt Set:  [550 mL-660 mL] 660 mL PEEP:  [5 cmH20] 5 cmH20 Pressure Support:  [5 cmH20-8 cmH20] 5 cmH20 Plateau Pressure:  [17 cmH20-20 cmH20] 18 cmH20  INTAKE / OUTPUT: I/O last 3 completed shifts: In: 5792.6 [I.V.:2576.4; Blood:418.7; NG/GT:2597.5; IV Piggyback:200] Out: 2810 [Urine:2810]  PHYSICAL EXAMINATION:  General - alert Eyes - pupils reactive ENT - ETT in place Cardiac - regular, no murmur Chest - no wheeze, rales Abd - soft, non tender Ext - no edema Skin - no rashes Neuro - moves rt side, follows commands  BMET  Recent Labs Lab 04/05/17 0423  04/06/17 0512  04/07/17 0240 04/07/17 0422 04/07/17 0745  NA 158*  < > 154*  < > 150* 150* 148*  K 3.5  --  3.6  --   --  3.8  --   CL 123*  --  122*  --   --  117*  --   CO2 28  --  28  --   --  30  --   BUN 16  --  18  --   --  18  --   CREATININE 0.91  --  0.94  --   --  0.91  --   GLUCOSE 206*  --  248*  --   --  254*  --   < > = values in this interval not displayed.  Electrolytes  Recent Labs Lab 04/04/17 0441 04/05/17 0423 04/06/17 0512 04/07/17 0422  CALCIUM 8.4* 8.3* 8.3* 8.5*  MG 2.2 2.0 2.2  --   PHOS 2.8 3.3 3.5  --     CBC  Recent Labs Lab 04/05/17 0717  04/06/17 0512 04/06/17 1031 04/06/17 2329 04/07/17 0422  WBC 24.0*  --   20.1*  --   --  17.3*  HGB 6.6*  < > 6.7* 7.8* 7.3* 7.0*  HCT 22.8*  < > 21.9* 25.8* 23.5* 23.0*  PLT 173  --  161  --   --  160  < > = values in this interval not displayed.  Coag's  Recent Labs Lab 03/31/17 1114 04/01/17 0225  APTT 33 31  INR 1.12  --     Sepsis Markers  Recent Labs Lab 03/31/17 1114 04/01/17 0225  PROCALCITON 4.28 2.53    ABG  Recent Labs Lab 04/01/17 0250 04/01/17 0904  PHART 7.483* 7.402  PCO2ART 28.3* 31.3*  PO2ART 136* 353.0*    Liver Enzymes  Recent Labs Lab 04/01/17 0225 04/04/17 0148  AST 40 33  ALT 13* 13*  ALKPHOS 58 59  BILITOT 1.7* 1.3*  ALBUMIN 2.2* 1.7*    Cardiac Enzymes  Recent Labs Lab 04/04/17 0148 04/04/17 0932  04/04/17 1500  TROPONINI 0.04* 0.03* 0.04*    Glucose  Recent Labs Lab 04/06/17 1216 04/06/17 1541 04/06/17 1953 04/06/17 2312 04/07/17 0354 04/07/17 0738  GLUCAP 214* 211* 201* 209* 229* 216*    Imaging Ct Abdomen Pelvis Wo Contrast  Result Date: 04/06/2017 CLINICAL DATA:  Abdominal pain. Decreased hemoglobin. Sepsis. Clinical suspicion for retroperitoneal hemorrhage. EXAM: CT ABDOMEN AND PELVIS WITHOUT CONTRAST TECHNIQUE: Multidetector CT imaging of the abdomen and pelvis was performed following the standard protocol without IV contrast. COMPARISON:  None. FINDINGS: Lower chest: Bibasilar atelectasis and tiny left pleural effusion. Hepatobiliary: 3.5 cm low-attenuation lesion in superior left hepatic lobe has higher than fluid attenuation but cannot be characterized on this unenhanced exam. Pancreas: No mass or inflammatory process visualized on this unenhanced exam. Spleen:  Within normal limits in size. Adrenals/Urinary tract: No evidence of urolithiasis or hydronephrosis. Unremarkable unopacified urinary bladder. Stomach/Bowel: Nasogastric tube is seen in the gastric body. No evidence of obstruction, inflammatory process, or abnormal fluid collections. Vascular/Lymphatic: No pathologically  enlarged lymph nodes identified. No evidence of abdominal aortic aneurysm. Aortic atherosclerosis. Reproductive:  No mass or other significant abnormality. Other: No evidence of retroperitoneal hematoma. Diffuse body wall edema is seen as well as diffuse stranding throughout the retroperitoneal and mesenteric fat. Musculoskeletal:  No suspicious bone lesions identified. IMPRESSION: No evidence of retroperitoneal hematoma. Diffuse mesenteric and retroperitoneal soft tissue stranding and body wall edema, consistent with third spacing. No evidence of focal inflammatory process or abscess. Tiny left pleural effusion and bibasilar atelectasis noted. Indeterminate 3.5 cm low-attenuation lesion in left hepatic lobe. Consider further characterization with hepatic protocol abdomen CT with contrast (or MRI if patient can cooperate with breath-holding). Electronically Signed   By: Earle Gell M.D.   On: 04/06/2017 11:52   Dg Chest Port 1 View  Result Date: 04/07/2017 CLINICAL DATA:  Respiratory failure. EXAM: PORTABLE CHEST 1 VIEW COMPARISON:  04/06/2017 . FINDINGS: Endotracheal tube tip again noted at the thoracic inlet. Again advancement of approximately 2 cm should be considered. NG tube in stable position. Heart size stable. Mild bibasilar atelectasis again noted. Tiny left pleural effusion cannot be excluded. No pneumothorax. IMPRESSION: 1. Endotracheal tube tip again noted at the thoracic inlet. Again advancement approximately 2 cm should be considered. NG tube in stable position. 2. Mild bibasilar subsegmental atelectasis again noted, no interim change. Tiny left pleural effusion on today's exam cannot be excluded . Electronically Signed   By: Marcello Moores  Register   On: 04/07/2017 07:24     STUDIES:  LP 8/24 >> glucose 205, RBC 16, protein 62, WBC 5 CT head 8/24 >> Large Rt MCA EEG 8/27 >> bilateral cerebral dysfunction, non-specific in etiology Echo 8/27 >> EF 65 to 70% CT abd/pelvis 8/30 >> third spacing, 3.5  cm low attenuation Lt hepatic lobe  CULTURES: CSF 8/24>> negative BCX 8/24>> negative UCX 8/24>> negative Sputum 8/24>> S. agalactiae  ANTIBIOTICS: vanco 8/24 >> 8/26 Zosyn 8/24 >> 8/26 Ampicillin 8/27 > 8/30  SIGNIFICANT EVENTS: 8/23 Admit [facility] 8/23 8/24 Transfer to [facility] after Rt MCA CVA, start 3% NS 8/25 Decompressive craniectomy 8/27 Transfuse 1 unit PRBC 8/28 Transfuse 1 unit PRBC, A fib with RVR 8/30 Transfuse 1 unit PRBC 8/31 Off pressors  LINES/TUBES: ETT 8/23 >> Lt IJ CVL 8/23 >>  ASSESSMENT / PLAN:  PULMONARY A: Compromised airway. P:   Extubation trial 8/31  CARDIOVASCULAR A:  Septic shock >> resolved. A fib with RVR >> back in sinus rhythm. P:  Prn lopressor for  HR > 120 D/c amiodarone 8/31  RENAL A:   Medically induced hypernatremia. P:  3% NS per neurology  GASTROINTESTINAL A:   Nutrition. Dysphagia. P:   Swallow assessment after extubation  HEMATOLOGIC A:   Persistent anemia >> no evidence for GI bleeding, and CT abdomen negative. P:  F/u CBC Transfuse for Hb < 7 Check LDH, haptoglobin, LFTs  INFECTIOUS A:   Sepsis - sputum culture positive for S. Agalactiae >> completed Abx 8/30. P:   Monitor clinically  ENDOCRINE A:   DKA - resolved. DM type I. 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 S/p decompressive craniectomy. P:   RASS goal 0 to -1 Neurosurgery s/o 04/02/17 >> recommend d/cing sutures 10 to 14 days after surgery, and f/u with neurosurgery in 3 months to assess for cranioplasty Continue ASA   CC time 31 minutes   Chesley Mires, MD [place] 04/07/2017, 8:48 AM Pager:  [phone] After 3pm call: [phone]

## 2017-04-07 NOTE — Progress Notes (Signed)
STROKE TEAM PROGRESS NOTE   SUBJECTIVE (INTERVAL HISTORY) The patient was extubated for about 5 min and then was re-intubated. Not able to tolerate extubation and no gag or cough after extubation. No neuro changes. Hb continues to drop and need PRBC transfusion again. No source of bleeding find. CT abdomen done showed no retroperitoneal hematoma yesterday.   OBJECTIVE Temp:  [98.3 F (36.8 C)-100.3 F (37.9 C)] 98.3 F (36.8 C) (08/31 1850) Pulse Rate:  [75-114] 81 (08/31 1900) Cardiac Rhythm: Normal sinus rhythm (08/31 0800) Resp:  [15-22] 18 (08/31 1530) BP: (101-156)/(52-85) 121/64 (08/31 1900) SpO2:  [92 %-100 %] 100 % (08/31 1900) FiO2 (%):  [40 %] 40 % (08/31 1900) Weight:  [227 lb 1.2 oz (103 kg)] 227 lb 1.2 oz (103 kg) (08/31 0407)  CBC:   Recent Labs Lab 04/01/17 0225  04/06/17 0512  04/07/17 0422 04/07/17 1715  WBC 22.2*  < > 20.1*  --  17.3*  --   NEUTROABS 15.1*  --   --   --   --   --   HGB 10.9*  < > 6.7*  < > 7.0* 6.6*  HCT 32.9*  < > 21.9*  < > 23.0* 22.2*  MCV 77.6*  < > 95.6  --  95.4  --   PLT 123*  < > 161  --  160  --   < > = values in this interval not displayed.  Basic Metabolic Panel:   Recent Labs Lab 04/05/17 0423  04/06/17 0512  04/07/17 0422 04/07/17 0745 04/07/17 1453  NA 158*  < > 154*  < > 150* 148* 150*  K 3.5  --  3.6  --  3.8  --   --   CL 123*  --  122*  --  117*  --   --   CO2 28  --  28  --  30  --   --   GLUCOSE 206*  --  248*  --  254*  --   --   BUN 16  --  18  --  18  --   --   CREATININE 0.91  --  0.94  --  0.91  --   --   CALCIUM 8.3*  --  8.3*  --  8.5*  --   --   MG 2.0  --  2.2  --   --   --   --   PHOS 3.3  --  3.5  --   --   --   --   < > = values in this interval not displayed.  Lipid Panel:     Component Value Date/Time   CHOL 93 04/02/2017 0607   TRIG 135 04/02/2017 0607   HDL 28 (L) 04/02/2017 0607   CHOLHDL 3.3 04/02/2017 0607   VLDL 27 04/02/2017 0607   LDLCALC 38 04/02/2017 0607   HgbA1c:  Lab  Results  Component Value Date   HGBA1C 14.3 (H) 04/02/2017   Urine Drug Screen: No results found for: LABOPIA, COCAINSCRNUR, LABBENZ, AMPHETMU, THCU, LABBARB  Alcohol Level No results found for: [place] I have personally reviewed the radiological images below and agree with the radiology interpretations.  Ct Head Wo Contrast 03/31/2017 1. Progressive large right MCA territory infarct with involvement of the right occipital lobe likely related to a fetal type right posterior cerebral artery.  2. Diffuse edema and mass effect with 12 mm midline shift and partial effacement of the para mesencephalic cisterns.  3. Developing left posterior watershed territory infarct.  4. Nasal fluid and bilateral sinus disease likely related to intubation.   Ct Head Wo Contrast 03/30/2017 No acute intracranial abnormality identified. Unremarkable CT of the brain.   Ct Angio Head and neck W Or Wo Contrast 04/02/2017 IMPRESSION: 1. Occlusion of the right middle cerebral artery proximal M1 segment with extensive cytotoxic edema throughout the entire right MCA distribution. 2. Status post right pterional craniectomy for decompression with unchanged leftward midline shift measuring 8 mm. 3. No hemorrhage. 4. Normal carotid arteries.  No embolic source identified.   LE venous doppler - No evidence of deep vein thrombosis or baker's cysts bilaterally.  EEG - This EEG is abnormal due to bilateral slowing with suppression of the right hemisphere. Clinical Correlation of the above findings indicates bilateral cerebral dysfunction that is non-specific in etiology and can be seen with hypoxic/ischemic injury, toxic/metabolic encephalopathies, neurodegenerative disorders, or medication effect.  The absence of epileptiform discharges does not rule out a clinical diagnosis of epilepsy.  Clinical correlation is advised.  TTE Left ventricle: The cavity size was normal. There was severe   concentric hypertrophy. Systolic  function was vigorous. The   estimated ejection fraction was in the range of 65% to 70%. Wall   motion was normal; there were no regional wall motion   abnormalities. Left ventricular diastolic function parameters   were normal. - Atrial septum: There was increased thickness of the septum,   consistent with lipomatous hypertrophy. - Pulmonary arteries: Systolic pressure could not be accurately   estimated.  Ct Head Wo Contrast 04/05/2017 IMPRESSION: 1. Evolving acute extensive RIGHT cerebrum infarcts including RIGHT MCA, to lesser extent RIGHT ACA and RIGHT PCA territories. Petechial hemorrhage without hemorrhagic conversion. 2. Similar 7 mm RIGHT to LEFT midline shift without ventricular entrapment. Similar external herniation via RIGHT craniectomy defect. 3. Similar acute small LEFT parietal lobe infarct.   Ct Abdomen Pelvis Wo Contrast 04/06/2017 IMPRESSION: No evidence of retroperitoneal hematoma. Diffuse mesenteric and retroperitoneal soft tissue stranding and body wall edema, consistent with third spacing. No evidence of focal inflammatory process or abscess. Tiny left pleural effusion and bibasilar atelectasis noted. Indeterminate 3.5 cm low-attenuation lesion in left hepatic lobe. Consider further characterization with hepatic protocol abdomen CT with contrast (or MRI if patient can cooperate with breath-holding).    PHYSICAL EXAM   Vitals:   04/07/17 1800 04/07/17 1830 04/07/17 1850 04/07/17 1900  BP: 126/62 130/60 130/60 121/64  Pulse: 99 92 84 81  Resp:      Temp:  98.5 F (36.9 C) 98.3 F (36.8 C)   TempSrc:  Axillary    SpO2: 100% 100% 100% 100%  Weight:      Height:       Middle-age African-American male who is sedated and intubated. Craniotomy bandage on the right skull. Afebrile. Head is nontraumatic. Cardiac exam no murmur or gallop. Distal pulses are well felt. Neurological Exam :  Still intubated, eyes open on voice. Follows simple commands on the right UE and LE.  Right gaze preference, not able to cross midline to the left. Pupils 3 mm reactive to light. Fundi could not be visualized. Corneal, gag and cough are present. Patient is able to move right upper and lower extremity spontaneously and against gravity with brisk withdrawal response to pain. Dense left hemiplegia with decreased tone. Reflexes present on the right and absent on the left. Left plantar upgoing right equivocal. Sensation, coordination and gait not tested.   ASSESSMENT/PLAN Mr. Joe  Wilkinson is a 56 y.o. male with history of diabetes mellitus (other past medical history unknown at this time) presenting with unresponsiveness. He did not receive IV t-PA due to decompressive right frontotemporoparietal craniectomy for decompression.  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 S/p right decompressive hemicraniectomy.   Resultant  left hemiplegia and right gaze   CT head - Progressive large right MCA territory infarct with involvement of the right occipital lobe likely related to a fetal type right PCA. Diffuse edema and mass effect. Developing left posterior watershed territory infarct.   CTA head and neck - right M1 cut off, b/l fetal PCAs with dimunitive BA and VAs  MRI head - consider once stabilized  2D Echo - EF 65-70%  EEG no seizure  LDL - 38   HgbA1c - 14.3  VTE prophylaxis - SCDs (not on lovenox due to critical anemia needed repeated PRBC) Diet NPO time specified  No antithrombotic prior to admission, now on ASA 325mg   Patient will be counseled to be compliant with his antithrombotic medications  Ongoing aggressive stroke risk factor management  Therapy recommendations:  pending  Disposition:  Pending  Cerebral edema  CT head large right MCA infarct with diffuse edema and midline shift  Status post decompressive hemicraniectomy  Neurosurgery on board  Repeat CT evolving right malignant MCA,  stable   Sodium 158->155->158->154->150  Back on 3% saline  Sodium goal of 150-160, continue monitoring Q6h  PAF - could be potential source of current stroke  Confirmed with EKG  HR can go up to 140-150s  Reversed by amiodarone  Now off amiodarone  CCM on board  Acute blood loss anemia - no source found so far  Source unclear, could be due to surgery  Hb 12.6 - 10.9 - 8.2 - 7.0 - 6.2 - PRBC - 7.5 -8.1-> 6.6->PRBC->7.2->6.7-> PRBC->7.8->7.0->6.6 - PRBC  S/p repeated PRBC transfusion  CT abdomen - no retroperitoneal hematoma  No bloody or dark bowel movement  Stool occult blood pending  CBC monitoring  Diabetes with DKA  HgbA1c 14.3, goal < 7.0  Uncontrolled  Off insulin drip, on Levemir  Still significant hyperglycemia  DM nurse on board, appreciate help  CCM on board  Leukocytosis,? Sepsis  WBC 18.8 -> 26.2->25.5->24.0->20.1->17.3  Off Abx  off Neo now  CCM on board  Hypertension  Stable off Neo for hypotension BP goal 120-160 Long-term BP goal normotensive  Other Stroke Risk Factors  Obesity, Body mass index is 32.58 kg/m., recommend weight loss, diet and exercise as appropriate   Other Active Problems  Induced hypernatremia  Thrombocytopenia - resolved  TF @ [facility] day # 6  This patient is critically ill due to right malignant MCA infarct status post hemicraniectomy, embolic infarcts to left MCA, PAF, diabetes and DKA, sepsis, leukocytosis, question procedure and at significant risk of neurological worsening, death form cerebral edema, ankle herniation, heart failure, status epilepticus, septic shock, DKA, hemorrhagic transformation. This patient's care requires constant monitoring of vital signs, hemodynamics, respiratory and cardiac monitoring, review of multiple databases, neurological assessment, discussion with family, other specialists and medical decision making of high complexity. I spent 35 minutes of neurocritical  care time in the care of this patient.  Rosalin Hawking, MD PhD Stroke Neurology 04/07/2017 7:42 PM   To contact Stroke Continuity provider, please refer to [email or web address] After hours, contact General Neurology

## 2017-04-07 NOTE — Progress Notes (Signed)
[place] Progress Note Patient Name: Joe Wilkinson DOB: 07/15/1961 MRN: [id number]   Date of Service  04/07/2017  HPI/Events of Note  Self extubated - had failed extubation earlier today due to poor gag  eICU Interventions  WOB ok on camera Gag weak Will observe & reintubate if needed     Intervention Category Major Interventions: Respiratory failure - evaluation and management  ALVA,RAKESH V. 04/07/2017, 11:50 PM

## 2017-04-08 ENCOUNTER — Inpatient Hospital Stay (HOSPITAL_COMMUNITY): Payer: Medicaid Other

## 2017-04-08 DIAGNOSIS — J9601 Acute respiratory failure with hypoxia: Secondary | ICD-10-CM

## 2017-04-08 LAB — BPAM RBC
BLOOD PRODUCT EXPIRATION DATE: 201809032359
BLOOD PRODUCT EXPIRATION DATE: 201809052359
BLOOD PRODUCT EXPIRATION DATE: 201809192359
ISSUE DATE / TIME: 201808291036
ISSUE DATE / TIME: 201808300651
ISSUE DATE / TIME: 201808311820
UNIT TYPE AND RH: 9500
Unit Type and Rh: 9500
Unit Type and Rh: 9500

## 2017-04-08 LAB — HAPTOGLOBIN: Haptoglobin: 168 mg/dL (ref 34–200)

## 2017-04-08 LAB — BASIC METABOLIC PANEL
ANION GAP: 5 (ref 5–15)
BUN: 16 mg/dL (ref 6–20)
CO2: 29 mmol/L (ref 22–32)
Calcium: 8.6 mg/dL — ABNORMAL LOW (ref 8.9–10.3)
Chloride: 117 mmol/L — ABNORMAL HIGH (ref 101–111)
Creatinine, Ser: 0.86 mg/dL (ref 0.61–1.24)
GFR calc non Af Amer: >60 mL/min (ref >60)
GLUCOSE: 142 mg/dL — AB (ref 65–99)
POTASSIUM: 3.7 mmol/L (ref 3.5–5.1)
SODIUM: 151 mmol/L — AB (ref 135–145)

## 2017-04-08 LAB — GLUCOSE, CAPILLARY
GLUCOSE-CAPILLARY: 149 mg/dL — AB (ref 65–99)
GLUCOSE-CAPILLARY: 109 mg/dL — AB (ref 65–99)
Glucose-Capillary: 140 mg/dL — ABNORMAL HIGH (ref 65–99)
Glucose-Capillary: 177 mg/dL — ABNORMAL HIGH (ref 65–99)
Glucose-Capillary: 213 mg/dL — ABNORMAL HIGH (ref 65–99)

## 2017-04-08 LAB — BLOOD GAS, ARTERIAL
Acid-Base Excess: 3.3 mmol/L — ABNORMAL HIGH (ref 0.0–2.0)
Bicarbonate: 27.7 mmol/L (ref 20.0–28.0)
DRAWN BY: 51191
FIO2: 40
MECHVT: 550 mL
O2 Saturation: 97.2 %
PEEP: 5 cmH2O
PO2 ART: 88.8 mmHg (ref 83.0–108.0)
Patient temperature: 98.6
RATE: 16 resp/min
pCO2 arterial: 44.9 mmHg (ref 32.0–48.0)
pH, Arterial: 7.407 (ref 7.350–7.450)

## 2017-04-08 LAB — TYPE AND SCREEN
ABO/RH(D): O NEG
Antibody Screen: NEGATIVE
UNIT DIVISION: 0
UNIT DIVISION: 0
UNIT DIVISION: 0

## 2017-04-08 LAB — SODIUM
SODIUM: 150 mmol/L — AB (ref 135–145)
Sodium: 149 mmol/L — ABNORMAL HIGH (ref 135–145)
Sodium: 148 mmol/L — ABNORMAL HIGH (ref 135–145)
Sodium: 146 mmol/L — ABNORMAL HIGH (ref 135–145)

## 2017-04-08 LAB — CBC
HCT: 24.9 % — ABNORMAL LOW (ref 39.0–52.0)
Hemoglobin: 7.7 g/dL — ABNORMAL LOW (ref 13.0–17.0)
MCH: 29.3 pg (ref 26.0–34.0)
MCHC: 30.9 g/dL (ref 30.0–36.0)
MCV: 94.7 fL (ref 78.0–100.0)
PLATELETS: 175 10*3/uL (ref 150–400)
RBC: 2.63 MIL/uL — AB (ref 4.22–5.81)
RDW: 20.8 % — ABNORMAL HIGH (ref 11.5–15.5)
WBC: 14.3 10*3/uL — AB (ref 4.0–10.5)

## 2017-04-08 MED ORDER — ETOMIDATE 2 MG/ML IV SOLN
20.0000 mg | Freq: Once | INTRAVENOUS | Status: AC
  Administered 2017-04-08: 20 mg via INTRAVENOUS

## 2017-04-08 MED ORDER — PIPERACILLIN-TAZOBACTAM 3.375 G IVPB
3.3750 g | Freq: Three times a day (TID) | INTRAVENOUS | Status: DC
  Administered 2017-04-08 – 2017-04-15 (×23): 3.375 g via INTRAVENOUS
  Filled 2017-04-08 (×25): qty 50

## 2017-04-08 MED ORDER — PIPERACILLIN-TAZOBACTAM 3.375 G IVPB 30 MIN: 3.3750 g | Freq: Four times a day (QID) | INTRAVENOUS | Status: DC

## 2017-04-08 MED ORDER — MIDAZOLAM HCL 2 MG/2ML IJ SOLN
INTRAMUSCULAR | Status: AC
  Administered 2017-04-08: 2 mg
  Filled 2017-04-08: qty 4

## 2017-04-08 NOTE — Progress Notes (Signed)
Pt using accessory muscles to breath, requiring reintubation.  Dr. Elsworth Soho and Respiratory agree.  Dr. Elwyn Reach at bedside to intubate.  2mg  versed and 20mg  etomidate given prior to intubation.  Pt tolerated well, but is suspect for aspiration, sputum ordered.  Xray ordered to confirm tube placement. Unable to reinsert OG tube due to swelling and coiling, tube feeds on hold for now.  Will continue to monitor.

## 2017-04-08 NOTE — Progress Notes (Signed)
STROKE TEAM PROGRESS NOTE   SUBJECTIVE (INTERVAL HISTORY) The patient was extubated for about 5 min and then was re-intubated. Not able to tolerate extubation and no gag or cough after extubation. No neuro changes. Hb continues to drop and need PRBC transfusion again. No source of bleeding find. CT abdomen done showed no retroperitoneal hematoma yesterday.   OBJECTIVE Temp:  [98.3 F (36.8 C)-100.3 F (37.9 C)] 98.4 F (36.9 C) (09/01 0400) Pulse Rate:  [63-114] 63 (09/01 0700) Cardiac Rhythm: Normal sinus rhythm (09/01 0000) Resp:  [15-27] 16 (09/01 0700) BP: (101-156)/(52-85) 103/69 (09/01 0700) SpO2:  [94 %-100 %] 100 % (09/01 0700) FiO2 (%):  [40 %] 40 % (09/01 0346) Weight:  [224 lb 3.3 oz (101.7 kg)] 224 lb 3.3 oz (101.7 kg) (09/01 0455) Examination:    Lungs : CTA Heart: RRR Abdomen: soft NT Neurological Exam :  Still intubated, eyes open on voice. Follows simple commands on the right UE and LE. Right gaze preference, not able to cross midline to the left. Pupils 3 mm reactive to light. Fundi could not be visualized. Corneal, gag and cough are present. Patient is able to move right upper and lower extremity spontaneously and against gravity with brisk withdrawal response to pain. Dense left hemiplegia with decreased tone. Reflexes present on the right and absent on the left. Left plantar upgoing right equivocal.   CBC:   Recent Labs Lab 04/07/17 0422  04/07/17 2251 04/08/17 0518  WBC 17.3*  --   --  14.3*  HGB 7.0*  < > 7.7* 7.7*  HCT 23.0*  < > 25.2* 24.9*  MCV 95.4  --   --  94.7  PLT 160  --   --  175  < > = values in this interval not displayed.  Basic Metabolic Panel:   Recent Labs Lab 04/05/17 0423  04/06/17 0512  04/07/17 0422  04/08/17 0214 04/08/17 0518  NA 158*  < > 154*  < > 150*  < > 149* 151*  K 3.5  --  3.6  --  3.8  --   --  3.7  CL 123*  --  122*  --  117*  --   --  117*  CO2 28  --  28  --  30  --   --  29  GLUCOSE 206*  --  248*  --   254*  --   --  142*  BUN 16  --  18  --  18  --   --  16  CREATININE 0.91  --  0.94  --  0.91  --   --  0.86  CALCIUM 8.3*  --  8.3*  --  8.5*  --   --  8.6*  MG 2.0  --  2.2  --   --   --   --   --   PHOS 3.3  --  3.5  --   --   --   --   --   < > = values in this interval not displayed.  Lipid Panel:     Component Value Date/Time   CHOL 93 04/02/2017 0607   TRIG 135 04/02/2017 0607   HDL 28 (L) 04/02/2017 0607   CHOLHDL 3.3 04/02/2017 0607   VLDL 27 04/02/2017 0607   LDLCALC 38 04/02/2017 0607   HgbA1c:  Lab Results  Component Value Date   HGBA1C 14.3 (H) 04/02/2017   Urine Drug Screen: No results found for: LABOPIA, COCAINSCRNUR, LABBENZ,  AMPHETMU, THCU, LABBARB  Alcohol Level No results found for: ETH  IMAGING   Ct Head Wo Contrast 03/31/2017 1. Progressive large right MCA territory infarct with involvement of the right occipital lobe likely related to a fetal type right posterior cerebral artery.  2. Diffuse edema and mass effect with 12 mm midline shift and partial effacement of the para mesencephalic cisterns.  3. Developing left posterior watershed territory infarct.  4. Nasal fluid and bilateral sinus disease likely related to intubation.   Ct Head Wo Contrast 03/30/2017 No acute intracranial abnormality identified. Unremarkable CT of the brain.   Ct Angio Head and neck W Or Wo Contrast 04/02/2017 IMPRESSION:  1. Occlusion of the right middle cerebral artery proximal M1 segment with extensive cytotoxic edema throughout the entire right MCA distribution.  2. Status post right pterional craniectomy for decompression with unchanged leftward midline shift measuring 8 mm.  3. No hemorrhage.  4. Normal carotid arteries.  No embolic source identified.   LE venous doppler - No evidence of deep vein thrombosis or baker's cysts bilaterally.  EEG - This EEG is abnormal due to bilateral slowing with suppression of the right hemisphere. Clinical Correlation of the above  findings indicates bilateral cerebral dysfunction that is non-specific in etiology and can be seen with hypoxic/ischemic injury, toxic/metabolic encephalopathies, neurodegenerative disorders, or medication effect.  The absence of epileptiform discharges does not rule out a clinical diagnosis of epilepsy.  Clinical correlation is advised.  TTE Left ventricle: The cavity size was normal. There was severe   concentric hypertrophy. Systolic function was vigorous. The   estimated ejection fraction was in the range of 65% to 70%. Wall   motion was normal; there were no regional wall motion   abnormalities. Left ventricular diastolic function parameters   were normal. - Atrial septum: There was increased thickness of the septum,   consistent with lipomatous hypertrophy. - Pulmonary arteries: Systolic pressure could not be accurately   estimated.  Ct Head Wo Contrast 04/05/2017 IMPRESSION:  1. Evolving acute extensive RIGHT cerebrum infarcts including RIGHT MCA, to lesser extent RIGHT ACA and RIGHT PCA territories. Petechial hemorrhage without hemorrhagic conversion.  2. Similar 7 mm RIGHT to LEFT midline shift without ventricular entrapment. Similar external herniation via RIGHT craniectomy defect.  3. Similar acute small LEFT parietal lobe infarct.   Ct Abdomen Pelvis Wo Contrast 04/06/2017 IMPRESSION:  No evidence of retroperitoneal hematoma. Diffuse mesenteric and retroperitoneal soft tissue stranding and body wall edema, consistent with third spacing. No evidence of focal inflammatory process or abscess. Tiny left pleural effusion and bibasilar atelectasis noted. Indeterminate 3.5 cm low-attenuation lesion in left hepatic lobe. Consider further characterization with hepatic protocol abdomen CT with contrast (or MRI if patient can cooperate with breath-holding).    PHYSICAL EXAM   Vitals:   04/08/17 0455 04/08/17 0500 04/08/17 0600 04/08/17 0700  BP:  111/65 108/66 103/69  Pulse:  70 68 63   Resp:  15 16 16   Temp:      TempSrc:      SpO2:  100% 100% 100%  Weight: 224 lb 3.3 oz (101.7 kg)     Height:       45 African-American male who is sedated and intubated. Craniotomy bandage on the right skull. Afebrile. Head is nontraumatic. Cardiac exam no murmur or gallop. Distal pulses are well felt. Neurological Exam :  Still intubated, eyes open on voice. Follows simple commands on the right UE and LE. Right gaze preference, not able to cross midline  to the left. Pupils 3 mm reactive to light. Fundi could not be visualized. Corneal, gag and cough are present. Patient is able to move right upper and lower extremity spontaneously and against gravity with brisk withdrawal response to pain. Dense left hemiplegia with decreased tone. Reflexes present on the right and absent on the left. Left plantar upgoing right equivocal. Sensation, coordination and gait not tested.   ASSESSMENT/PLAN Mr. Joe Wilkinson is a 56 y.o. male with history of diabetes mellitus (other past medical history unknown at this time) presenting with unresponsiveness. He did not receive IV t-PA due to decompressive right frontotemporoparietal craniectomy for decompression.  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 S/p right decompressive hemicraniectomy.   Resultant  left hemiplegia and right gaze   CT head - Progressive large right MCA territory infarct with involvement of the right occipital lobe likely related to a fetal type right PCA. Diffuse edema and mass effect. Developing left posterior watershed territory infarct.   CTA head and neck - right M1 cut off, b/l fetal PCAs with dimunitive BA and VAs  MRI head - consider once stabilized  2D Echo - EF 65-70%  EEG no seizure  LDL - 38   HgbA1c - 14.3  VTE prophylaxis - SCDs (not on lovenox due to critical anemia needed repeated PRBC) Diet NPO time specified  No antithrombotic prior  to admission, now on ASA 325mg   Patient will be counseled to be compliant with his antithrombotic medications  Ongoing aggressive stroke risk factor management  Therapy recommendations:  pending  Disposition:  Pending  Cerebral edema  CT head large right MCA infarct with diffuse edema and midline shift  Status post decompressive hemicraniectomy  Neurosurgery on board  Repeat CT evolving right malignant MCA, stable   Sodium 158->155->158->154->150  Back on 3% saline  Sodium goal of 150-160, continue monitoring Q6h  PAF - could be potential source of current stroke  Confirmed with EKG  HR can go up to 140-150s  Reversed by amiodarone  Now off amiodarone  CCM on board  Acute blood loss anemia - no source found so far  Source unclear, could be due to surgery  Hb 12.6 - 10.9 - 8.2 - 7.0 - 6.2 - PRBC - 7.5 -8.1-> 6.6->PRBC->7.2->6.7-> PRBC->7.8->7.0->6.6 - PRBC  S/p repeated PRBC transfusion  CT abdomen - no retroperitoneal hematoma  No bloody or dark bowel movement  Stool occult blood - pending ( ordered 04/06/2017 - spoke with nursing today to reinforce need)  CBC monitoring - H/H 7.8/25.8 Thursday - recheck in AM  Diabetes with DKA  HgbA1c 14.3, goal < 7.0  Uncontrolled  Off insulin drip, on Levemir  Still significant hyperglycemia  DM nurse on board, appreciate help  CCM on board  Leukocytosis,? Sepsis  WBC 18.8 -> 26.2->25.5->24.0->20.1->17.3  Off Abx  off Neo now  CCM on board  Hypertension  Stable off Neo for hypotension BP goal 120-160 Long-term BP goal normotensive  Other Stroke Risk Factors  Obesity, Body mass index is 32.17 kg/m., recommend weight loss, diet and exercise as appropriate   Other Active Problems  Induced hypernatremia  Thrombocytopenia - resolved  TF @ 75  Cortrak Tube - placed today 04/08/2017  Hospital day # 7  This patient is critically ill due to right malignant MCA infarct status post  hemicraniectomy, embolic infarcts to left MCA, PAF, diabetes and DKA, sepsis, leukocytosis, question procedure and at significant risk of neurological  worsening, death form cerebral edema, ankle herniation, heart failure, status epilepticus, septic shock, DKA, hemorrhagic transformation. This patient's care requires constant monitoring of vital signs, hemodynamics, respiratory and cardiac monitoring, review of multiple databases, neurological assessment, discussion with family, other specialists and medical decision making of high complexity. I spent 35 minutes of neurocritical care time in the care of this patient.    To contact Stroke Continuity provider, please refer to [email or web address] After hours, contact General Neurology

## 2017-04-08 NOTE — Progress Notes (Signed)
[place] Progress Note Patient Name: Joe Wilkinson DOB: 05-Jan-1961 MRN: [id number]   Date of Service  04/08/2017  HPI/Events of Note  Previously failed extubation. Sodium slowly down trending. Bedside nurse requesting restraint renal.   eICU Interventions  Order for restraints related.      Intervention Category Major Interventions: Other:  Tera Partridge 04/08/2017, 9:15 PM

## 2017-04-08 NOTE — Progress Notes (Signed)
MD called back. No new orders received at this time. Will continue to monitor.

## 2017-04-08 NOTE — Progress Notes (Signed)
Paged MD for sodium of 146 on patient. Goal is 150-155. Awaiting a call back/new orders.

## 2017-04-08 NOTE — Progress Notes (Signed)
Patient self extubated at 2340. Patient currently on 100% nonrebreather and maintaining airway. Dr. Elsworth Soho is aware. RT will monitor as needed.

## 2017-04-08 NOTE — Progress Notes (Signed)
PULMONARY / CRITICAL CARE MEDICINE   Name: Joe Wilkinson MRN: [id number] DOB: 10/01/1960    ADMISSION DATE:  03/31/2017 CHIEF COMPLAINT:  CVA  HISTORY OF PRESENT ILLNESS:   56 yo male brought to [facility] with altered mental status from DKA and sepsis.  Found to have RT MCA CVA and transfer to [facility].  Required Rt decompressive craniectomy and remained on vent post op.  SUBJECTIVE:  Reintubated yesterday shortly after extubation as he had no gag reflux with increased secretions. Self extubated overnight and had to be re intubated. Transfused 1 unit blood  VITAL SIGNS: BP 103/69   Pulse 63   Temp 98.4 F (36.9 C) (Axillary)   Resp 16   Ht 5\' 10"  (1.778 m)   Wt 224 lb 3.3 oz (101.7 kg)   SpO2 100%   BMI 32.17 kg/m   VENTILATOR SETTINGS: Vent Mode: PRVC FiO2 (%):  [40 %] 40 % Set Rate:  [16 bmp] 16 bmp Vt Set:  [550 mL] 550 mL PEEP:  [5 cmH20] 5 cmH20 Pressure Support:  [5 cmH20] 5 cmH20 Plateau Pressure:  [17 cmH20-19 cmH20] 17 cmH20  INTAKE / OUTPUT: I/O last 3 completed shifts: In: 3735.8 [I.V.:1402.1; Blood:365; NG/GT:1968.8] Out: 2750 [Urine:2750]  PHYSICAL EXAMINATION: Gen:      No acute distress HEENT:  EOMI, sclera anicteric Neck:     No masses; no thyromegaly, ETT in palce Lungs:    Clear to auscultation bilaterally; normal respiratory effort CV:         Regular rate and rhythm; no murmurs Abd:      + bowel sounds; soft, non-tender; no palpable masses, no distension Ext:    No edema; adequate peripheral perfusion Skin:      Warm and dry; no rash Neuro: Sedated  BMET  Recent Labs Lab 04/06/17 0512  04/07/17 0422  04/07/17 1953 04/08/17 0214 04/08/17 0518  NA 154*  < > 150*  < > 150* 149* 151*  K 3.6  --  3.8  --   --   --  3.7  CL 122*  --  117*  --   --   --  117*  CO2 28  --  30  --   --   --  29  BUN 18  --  18  --   --   --  16  CREATININE 0.94  --  0.91  --   --   --  0.86  GLUCOSE 248*  --  254*  --   --   --  142*  < > = values in this interval  not displayed.  Electrolytes  Recent Labs Lab 04/04/17 0441 04/05/17 0423 04/06/17 0512 04/07/17 0422 04/08/17 0518  CALCIUM 8.4* 8.3* 8.3* 8.5* 8.6*  MG 2.2 2.0 2.2  --   --   PHOS 2.8 3.3 3.5  --   --     CBC  Recent Labs Lab 04/06/17 0512  04/07/17 0422 04/07/17 1715 04/07/17 2251 04/08/17 0518  WBC 20.1*  --  17.3*  --   --  14.3*  HGB 6.7*  < > 7.0* 6.6* 7.7* 7.7*  HCT 21.9*  < > 23.0* 22.2* 25.2* 24.9*  PLT 161  --  160  --   --  175  < > = values in this interval not displayed.  Coag's No results for input(s): APTT, INR in the last 168 hours.  Sepsis Markers No results for input(s): LATICACIDVEN, PROCALCITON, O2SATVEN in the last 168 hours.  ABG  Recent Labs Lab 04/01/17 0904 04/08/17 0417  PHART 7.402 7.407  PCO2ART 31.3* 44.9  PO2ART 353.0* 88.8    Liver Enzymes  Recent Labs Lab 04/04/17 0148 04/07/17 1009  AST 33 22  ALT 13* 15*  ALKPHOS 59 51  BILITOT 1.3* 0.6  ALBUMIN 1.7* 1.3*    Cardiac Enzymes  Recent Labs Lab 04/04/17 0148 04/04/17 0932 04/04/17 1500  TROPONINI 0.04* 0.03* 0.04*    Glucose  Recent Labs Lab 04/07/17 1125 04/07/17 1527 04/07/17 1958 04/08/17 0003 04/08/17 0436 04/08/17 0717  GLUCAP 213* 233* 209* 219* 149* 109*    Imaging Dg Chest Port 1 View  Result Date: 04/08/2017 CLINICAL DATA:  ET tube placement EXAM: PORTABLE CHEST 1 VIEW COMPARISON:  04/07/2017 FINDINGS: Endotracheal tube tip 2.1 cm superior to carina. Left-sided central venous catheter tip overlies the brachiocephalic confluence. Low lung volumes with no change in mild central congestion and bibasilar atelectasis or small infiltrates. IMPRESSION: Endotracheal tube tip 2.1 cm superior to carina. Otherwise no significant interval change. Electronically Signed   By: Donavan Foil M.D.   On: 04/08/2017 01:19   Dg Chest Port 1 View  Result Date: 04/07/2017 CLINICAL DATA:  Endotracheal and OG tube placement. EXAM: PORTABLE CHEST 1 VIEW 9:20 a.m.  COMPARISON:  04/07/2017 at 5:48 a.m. FINDINGS: Endotracheal tube has been advanced and the tip is now at the origin of the right mainstem bronchus and needs to be retracted approximately 2 cm. OG tube has been advanced.  The tip is in the body of the stomach. Heart size and pulmonary vascularity are normal. Slight atelectasis at the bases, minimally increased. IMPRESSION: Endotracheal tube at the right mainstem bronchus. This should be retracted approximately 2 cm. Slight increased atelectasis. OG tube in good position. Critical Value/emergent results were called by telephone at the time of interpretation on 04/07/2017 at 9:34 am to [facility], RN, who verbally acknowledged these results. Electronically Signed   By: [name] M.D.   On: 04/07/2017 09:36    STUDIES:  LP 8/24 >> glucose 205, RBC 16, protein 62, WBC 5 CT head 8/24 >> Large Rt MCA EEG 8/27 >> bilateral cerebral dysfunction, non-specific in etiology Echo 8/27 >> EF 65 to 70% CT abd/pelvis 8/30 >> third spacing, 3.5 cm low attenuation Lt hepatic lobe  CULTURES: CSF 8/24>> negative BCX 8/24>> negative UCX 8/24>> negative Sputum 8/24>> S. agalactiae  ANTIBIOTICS: vanco 8/24 >> 8/26 Zosyn 8/24 >> 8/26, 9/1 >> Ampicillin 8/27 > 8/30  SIGNIFICANT EVENTS: 8/23 Admit [facility] 8/23 8/24 Transfer to [facility] after Rt MCA CVA, start 3% NS 8/25 Decompressive craniectomy 8/27 Transfuse 1 unit PRBC 8/28 Transfuse 1 unit PRBC, A fib with RVR 8/30 Transfuse 1 unit PRBC 8/31 Off pressors, reintubated x 2, transfuse PRBC  LINES/TUBES: ETT 8/23 >> Lt IJ CVL 8/23 >>  ASSESSMENT / PLAN:  PULMONARY A: Compromised airway. Aspiration P:   Failed extubation May be headed towards trach. Will need to discuss with family.  CARDIOVASCULAR A:  Septic shock >> resolved. A fib with RVR >> back in sinus rhythm. P:  PRN lopressor Off amio since 8/31  RENAL A:   Medically induced hypernatremia. P:  Continue 3% normal saline per  nephrology  GASTROINTESTINAL A:   Nutrition. Dysphagia. P:   Start tube feeds PPI for ulcer prophylaxis  HEMATOLOGIC A:   Persistent anemia >> no evidence for GI bleeding, and CT abdomen negative. No evidence of hemolysis P:  F/u CBC Transfuse for Hb < 7  INFECTIOUS A:  Sepsis - sputum culture positive for S. Agalactiae >> completed Abx 8/30. Restarted on zosyn P:   Follow cultures  ENDOCRINE A:   DKA - resolved. DM type I. P:   SSI Levimir  NEUROLOGIC A:   Large right MCA CVA with edema and 28mm MLS. S/p decompressive craniectomy. P:   RASS goal 0 to -1 Neurosurgery s/o 04/02/17 >> recommend d/cing sutures 10 to 14 days after surgery, and f/u with neurosurgery in 3 months to assess for cranioplasty Continue ASA   The patient is critically ill with multiple organ system failure and requires high complexity decision making for assessment and support, frequent evaluation and titration of therapies, advanced monitoring, review of radiographic studies and interpretation of complex data.   Critical Care Time devoted to patient care services, exclusive of separately billable procedures, described in this note is 35 minutes.   Marshell Garfinkel MD [facility] Pager [phone] If no answer or after 3pm call: [phone] 04/08/2017, 8:25 AM

## 2017-04-08 NOTE — Progress Notes (Signed)
Cortrak Tube Team Note:  Consult received to place a Cortrak feeding tube.   A 10 F Cortrak tube was placed in the R nare and secured with a nasal bridle at 89 cm. Per the Cortrak monitor reading the tube tip is at the pylorus.   No x-ray is required. RN may begin using tube.   If the tube becomes dislodged please keep the tube and contact the Cortrak team at [email or web address] (password [email or web address]) for replacement.  If after hours and replacement cannot be delayed, place a NG tube and confirm placement with an abdominal x-ray.   Burtis Junes RD, LDN, CNSC Clinical Nutrition Pager: [phone] 04/08/2017 1:23 PM

## 2017-04-08 NOTE — Procedures (Signed)
Intubation Procedure Note Joe Wilkinson [id number] [birth year]  Procedure: Intubation Indications: Respiratory insufficiency  Procedure Details Consent: Unable to obtain consent because of altered level of consciousness. Time Out: Verified patient identification, verified procedure, site/side was marked, verified correct patient position, special equipment/implants available, medications/allergies/relevent history reviewed, required imaging and test results available.  Performed  Maximum sterile technique was used including gloves and mask.  MAC 4 glidoscope with 7.7 tube at 24 at the lips. Patient had a lot of thick secretions from the ETT     Evaluation Hemodynamic Status: BP stable throughout; O2 sats: stable throughout Patient's Current Condition: stable Complications: No apparent complications Patient did tolerate procedure well. Chest X-ray ordered to verify placement.  CXR: pending.   Joe Wilkinson 04/08/2017

## 2017-04-09 ENCOUNTER — Inpatient Hospital Stay (HOSPITAL_COMMUNITY): Payer: Medicaid Other

## 2017-04-09 LAB — CBC
HEMATOCRIT: 24.1 % — AB (ref 39.0–52.0)
HEMOGLOBIN: 7.3 g/dL — AB (ref 13.0–17.0)
MCH: 29.1 pg (ref 26.0–34.0)
MCHC: 30.3 g/dL (ref 30.0–36.0)
MCV: 96 fL (ref 78.0–100.0)
Platelets: 189 10*3/uL (ref 150–400)
RBC: 2.51 MIL/uL — AB (ref 4.22–5.81)
RDW: 21.1 % — AB (ref 11.5–15.5)
WBC: 13.3 10*3/uL — AB (ref 4.0–10.5)

## 2017-04-09 LAB — BASIC METABOLIC PANEL
ANION GAP: 5 (ref 5–15)
BUN: 18 mg/dL (ref 6–20)
CHLORIDE: 115 mmol/L — AB (ref 101–111)
CO2: 28 mmol/L (ref 22–32)
Calcium: 8.3 mg/dL — ABNORMAL LOW (ref 8.9–10.3)
Creatinine, Ser: 0.86 mg/dL (ref 0.61–1.24)
GFR calc non Af Amer: >60 mL/min (ref >60)
GLUCOSE: 251 mg/dL — AB (ref 65–99)
POTASSIUM: 4.1 mmol/L (ref 3.5–5.1)
Sodium: 148 mmol/L — ABNORMAL HIGH (ref 135–145)

## 2017-04-09 LAB — GLUCOSE, CAPILLARY
GLUCOSE-CAPILLARY: 200 mg/dL — AB (ref 65–99)
GLUCOSE-CAPILLARY: 207 mg/dL — AB (ref 65–99)
GLUCOSE-CAPILLARY: 214 mg/dL — AB (ref 65–99)
GLUCOSE-CAPILLARY: 252 mg/dL — AB (ref 65–99)
Glucose-Capillary: 238 mg/dL — ABNORMAL HIGH (ref 65–99)
Glucose-Capillary: 241 mg/dL — ABNORMAL HIGH (ref 65–99)

## 2017-04-09 LAB — LACTATE DEHYDROGENASE: LDH: 263 U/L — ABNORMAL HIGH (ref 98–192)

## 2017-04-09 LAB — DIC (DISSEMINATED INTRAVASCULAR COAGULATION) PANEL
D DIMER QUANT: 4.83 ug{FEU}/mL — AB (ref 0.00–0.50)
SMEAR REVIEW: NONE SEEN

## 2017-04-09 LAB — PHOSPHORUS: PHOSPHORUS: 3.7 mg/dL (ref 2.5–4.6)

## 2017-04-09 LAB — DIC (DISSEMINATED INTRAVASCULAR COAGULATION)PANEL
INR: 1.15
Platelets: 186 10*3/uL (ref 150–400)
Prothrombin Time: 14.6 seconds (ref 11.4–15.2)
aPTT: 32 seconds (ref 24–36)

## 2017-04-09 LAB — SODIUM
SODIUM: 145 mmol/L (ref 135–145)
SODIUM: 146 mmol/L — AB (ref 135–145)
SODIUM: 147 mmol/L — AB (ref 135–145)
SODIUM: 145 mmol/L (ref 135–145)

## 2017-04-09 LAB — MAGNESIUM: Magnesium: 2.4 mg/dL (ref 1.7–2.4)

## 2017-04-09 LAB — SAVE SMEAR

## 2017-04-09 MED ORDER — INSULIN DETEMIR 100 UNIT/ML ~~LOC~~ SOLN
34.0000 [IU] | Freq: Every day | SUBCUTANEOUS | Status: DC
  Administered 2017-04-09 – 2017-05-29 (×51): 34 [IU] via SUBCUTANEOUS
  Filled 2017-04-09 (×54): qty 0.34

## 2017-04-09 NOTE — Consult Note (Signed)
Date of consult: 04/09/2017 Request or: Dr. 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 This was on 04/01/2017 Since then his incision site has become progressively more tense Surgery was done by Dr. 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 Staples are intact. Original plan was to leave staples in for 10-14 days. I would advise leaving the staples for 14 days Will follow along with wound checks daily while he is here.

## 2017-04-09 NOTE — Progress Notes (Signed)
STROKE TEAM PROGRESS NOTE   SUBJECTIVE (INTERVAL HISTORY) The patient was extubated for about 5 min and then was re-intubated. Not able to tolerate extubation and no gag or cough after extubation. No neuro changes. Hb continues to drop and need PRBC transfusion again. No source of bleeding find. CT abdomen done showed no retroperitoneal hematoma yesterday.   OBJECTIVE Temp:  [98.4 F (36.9 C)-99.7 F (37.6 C)] 99.2 F (37.3 C) (09/02 0726) Pulse Rate:  [58-90] 72 (09/02 0700) Cardiac Rhythm: Normal sinus rhythm (09/01 2000) Resp:  [11-22] 16 (09/02 0700) BP: (101-124)/(57-96) 108/65 (09/02 0700) SpO2:  [97 %-100 %] 100 % (09/02 0700) FiO2 (%):  [30 %-40 %] 40 % (09/02 0345) Weight:  [220 lb 0.3 oz (99.8 kg)] 220 lb 0.3 oz (99.8 kg) (09/02 0426) Examination:    Lungs : CTA Heart: RRR Abdomen: soft NT Neurological Exam :  Still intubated, eyes open on voice. Follows simple commands on the right UE and LE. Right gaze preference, not able to cross midline to the left. Pupils 3 mm reactive to light. Fundi could not be visualized. Corneal, gag and cough are present. Patient is able to move right upper and lower extremity spontaneously and against gravity with brisk withdrawal response to pain. Dense left hemiplegia with decreased tone. Reflexes present on the right and absent on the left. Left plantar upgoing right equivocal.   CBC:   Recent Labs Lab 04/08/17 0518 04/09/17 0407  WBC 14.3* 13.3*  HGB 7.7* 7.3*  HCT 24.9* 24.1*  MCV 94.7 96.0  PLT 175 295    Basic Metabolic Panel:   Recent Labs Lab 04/06/17 0512  04/08/17 0518  04/09/17 0150 04/09/17 0407  NA 154*  < > 151*  < > 145 148*  K 3.6  < > 3.7  --   --  4.1  CL 122*  < > 117*  --   --  115*  CO2 28  < > 29  --   --  28  GLUCOSE 248*  < > 142*  --   --  251*  BUN 18  < > 16  --   --  18  CREATININE 0.94  < > 0.86  --   --  0.86  CALCIUM 8.3*  < > 8.6*  --   --  8.3*  MG 2.2  --   --   --   --  2.4  PHOS 3.5   --   --   --   --  3.7  < > = values in this interval not displayed.  Lipid Panel:     Component Value Date/Time   CHOL 93 04/02/2017 0607   TRIG 135 04/02/2017 0607   HDL 28 (L) 04/02/2017 0607   CHOLHDL 3.3 04/02/2017 0607   VLDL 27 04/02/2017 0607   LDLCALC 38 04/02/2017 0607   HgbA1c:  Lab Results  Component Value Date   HGBA1C 14.3 (H) 04/02/2017   Urine Drug Screen: No results found for: LABOPIA, COCAINSCRNUR, LABBENZ, AMPHETMU, THCU, LABBARB  Alcohol Level No results found for: ETH  IMAGING   Ct Head Wo Contrast 03/31/2017 1. Progressive large right MCA territory infarct with involvement of the right occipital lobe likely related to a fetal type right posterior cerebral artery.  2. Diffuse edema and mass effect with 12 mm midline shift and partial effacement of the para mesencephalic cisterns.  3. Developing left posterior watershed territory infarct.  4. Nasal fluid and bilateral sinus disease likely related to  intubation.   Ct Head Wo Contrast 03/30/2017 No acute intracranial abnormality identified. Unremarkable CT of the brain.   Ct Angio Head and neck W Or Wo Contrast 04/02/2017 IMPRESSION:  1. Occlusion of the right middle cerebral artery proximal M1 segment with extensive cytotoxic edema throughout the entire right MCA distribution.  2. Status post right pterional craniectomy for decompression with unchanged leftward midline shift measuring 8 mm.  3. No hemorrhage.  4. Normal carotid arteries.  No embolic source identified.   LE venous doppler - No evidence of deep vein thrombosis or baker's cysts bilaterally.  EEG - This EEG is abnormal due to bilateral slowing with suppression of the right hemisphere. Clinical Correlation of the above findings indicates bilateral cerebral dysfunction that is non-specific in etiology and can be seen with hypoxic/ischemic injury, toxic/metabolic encephalopathies, neurodegenerative disorders, or medication effect.  The absence  of epileptiform discharges does not rule out a clinical diagnosis of epilepsy.  Clinical correlation is advised.  TTE Left ventricle: The cavity size was normal. There was severe   concentric hypertrophy. Systolic function was vigorous. The   estimated ejection fraction was in the range of 65% to 70%. Wall   motion was normal; there were no regional wall motion   abnormalities. Left ventricular diastolic function parameters   were normal. - Atrial septum: There was increased thickness of the septum,   consistent with lipomatous hypertrophy. - Pulmonary arteries: Systolic pressure could not be accurately   estimated.  Ct Head Wo Contrast 04/05/2017 IMPRESSION:  1. Evolving acute extensive RIGHT cerebrum infarcts including RIGHT MCA, to lesser extent RIGHT ACA and RIGHT PCA territories. Petechial hemorrhage without hemorrhagic conversion.  2. Similar 7 mm RIGHT to LEFT midline shift without ventricular entrapment. Similar external herniation via RIGHT craniectomy defect.  3. Similar acute small LEFT parietal lobe infarct.   Ct Abdomen Pelvis Wo Contrast 04/06/2017 IMPRESSION:  No evidence of retroperitoneal hematoma. Diffuse mesenteric and retroperitoneal soft tissue stranding and body wall edema, consistent with third spacing. No evidence of focal inflammatory process or abscess. Tiny left pleural effusion and bibasilar atelectasis noted. Indeterminate 3.5 cm low-attenuation lesion in left hepatic lobe. Consider further characterization with hepatic protocol abdomen CT with contrast (or MRI if patient can cooperate with breath-holding).    PHYSICAL EXAM   Vitals:   04/09/17 0500 04/09/17 0600 04/09/17 0700 04/09/17 0726  BP: (!) 105/58 112/63 108/65   Pulse: 74 77 72   Resp: 16 16 16    Temp:    99.2 F (37.3 C)  TempSrc:    Axillary  SpO2: 100% 100% 100%   Weight:      Height:       65 African-American male who is sedated and intubated. Craniotomy bandage on the right  skull. Afebrile. Head is nontraumatic. Cardiac exam no murmur or gallop. Distal pulses are well felt. Neurological Exam :  Still intubated, eyes open on voice. Follows simple commands on the right UE and LE. Right gaze preference, not able to cross midline to the left. Pupils 3 mm reactive to light. Fundi could not be visualized. Corneal, gag and cough are present. Patient is able to move right upper and lower extremity spontaneously and against gravity with brisk withdrawal response to pain. Dense left hemiplegia with decreased tone. Reflexes present on the right and absent on the left. Left plantar upgoing right equivocal. Sensation, coordination and gait not tested.   ASSESSMENT/PLAN Mr. Joe Wilkinson is a 56 y.o. male with history of diabetes mellitus (other  past medical history unknown at this time) presenting with unresponsiveness. He did not receive IV t-PA due to decompressive right frontotemporoparietal craniectomy for decompression.  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 S/p right decompressive hemicraniectomy.   Resultant  left hemiplegia and right gaze   CT head - Progressive large right MCA territory infarct with involvement of the right occipital lobe likely related to a fetal type right PCA. Diffuse edema and mass effect. Developing left posterior watershed territory infarct.   CTA head and neck - right M1 cut off, b/l fetal PCAs with dimunitive BA and VAs  MRI head - consider once stabilized  2D Echo - EF 65-70%  EEG no seizure  LDL - 38   HgbA1c - 14.3  VTE prophylaxis - SCDs (not on lovenox due to critical anemia needed repeated PRBC) Diet NPO time specified  No antithrombotic prior to admission, now on ASA 325mg   Patient will be counseled to be compliant with his antithrombotic medications  Ongoing aggressive stroke risk factor management  Therapy recommendations:  pending  Disposition:   Pending  Cerebral edema  CT head large right MCA infarct with diffuse edema and midline shift  Status post decompressive hemicraniectomy  Neurosurgery on board  Repeat CT evolving right malignant MCA, stable   Sodium 158->155->158->154->150  Back on 3% saline  Sodium goal of 150-160, continue monitoring Q6h  PAF - could be potential source of current stroke  Confirmed with EKG  HR can go up to 140-150s  Reversed by amiodarone  Now off amiodarone  CCM on board  Acute blood loss anemia - no source found so far  Source unclear, could be due to surgery  Hb 12.6 - 10.9 - 8.2 - 7.0 - 6.2 - PRBC - 7.5 -8.1-> 6.6->PRBC->7.2->6.7-> PRBC->7.8->7.0->6.6 - PRBC  S/p repeated PRBC transfusion  CT abdomen - no retroperitoneal hematoma  No bloody or dark bowel movement  Stool occult blood - pending ( ordered 04/06/2017 - spoke with nursing today to reinforce need)  CBC monitoring - H/H 7.8/25.8 Thursday - recheck in AM  Diabetes with DKA  HgbA1c 14.3, goal < 7.0  Uncontrolled  Off insulin drip, on Levemir  Still significant hyperglycemia  DM nurse on board, appreciate help  CCM on board  Leukocytosis,? Sepsis  WBC 18.8 -> 26.2->25.5->24.0->20.1->17.3  Off Abx  off Neo now  CCM on board  Hypertension  Stable off Neo for hypotension BP goal 120-160 Long-term BP goal normotensive  Other Stroke Risk Factors  Obesity, Body mass index is 31.57 kg/m., recommend weight loss, diet and exercise as appropriate   Other Active Problems  Induced hypernatremia  Thrombocytopenia - resolved  TF @ 75  Cortrak Tube - placed today 04/08/2017  Hospital day # 8  This patient is critically ill due to right malignant MCA infarct status post hemicraniectomy, embolic infarcts to left MCA, PAF, diabetes and DKA, sepsis, leukocytosis, question procedure and at significant risk of neurological worsening, death form cerebral edema, ankle herniation, heart failure, status  epilepticus, septic shock, DKA, hemorrhagic transformation. This patient's care requires constant monitoring of vital signs, hemodynamics, respiratory and cardiac monitoring, review of multiple databases, neurological assessment, discussion with family, other specialists and medical decision making of high complexity. I spent 35 minutes of neurocritical care time in the care of this patient.    To contact Stroke Continuity provider, please refer to [email or web address] After hours, contact General Neurology

## 2017-04-09 NOTE — Progress Notes (Signed)
PULMONARY / CRITICAL CARE MEDICINE   Name: Joe Wilkinson MRN: [id number] DOB: 28-Aug-1960    ADMISSION DATE:  03/31/2017 CHIEF COMPLAINT:  CVA  HISTORY OF PRESENT ILLNESS:   56 yo male brought to [facility] with altered mental status from DKA and sepsis.  Found to have RT MCA CVA and transfer to [facility].  Required Rt decompressive craniectomy and remained on vent post op.  SUBJECTIVE:  On vent. No issues overnight  VITAL SIGNS: BP 106/64 (BP Location: Left Arm)   Pulse 71   Temp 99.2 F (37.3 C) (Axillary)   Resp 16   Ht 5\' 10"  (1.778 m)   Wt 220 lb 0.3 oz (99.8 kg)   SpO2 100%   BMI 31.57 kg/m   VENTILATOR SETTINGS: Vent Mode: PRVC FiO2 (%):  [30 %-40 %] 40 % Set Rate:  [16 bmp] 16 bmp Vt Set:  [550 mL] 550 mL PEEP:  [5 cmH20] 5 cmH20 Pressure Support:  [5 cmH20] 5 cmH20 Plateau Pressure:  [12 cmH20-15 cmH20] 15 cmH20  INTAKE / OUTPUT: I/O last 3 completed shifts: In: 3303 [I.V.:1501.8; Blood:335; NG/GT:1466.3] Out: 2650 [Urine:2650]  PHYSICAL EXAMINATION: Gen:      No acute distress HEENT:  EOMI, sclera anicteric, ETT in place Neck:     No masses; no thyromegaly Lungs:    Clear to auscultation bilaterally; normal respiratory effort CV:         Regular rate and rhythm; no murmurs Abd:      + bowel sounds; soft, non-tender; no palpable masses, no distension Ext:    No edema; adequate peripheral perfusion Skin:      Warm and dry; no rash Neuro: Opens eyes to command  BMET  Recent Labs Lab 04/07/17 0422  04/08/17 0518  04/08/17 1948 04/09/17 0150 04/09/17 0407  NA 150*  < > 151*  < > 146* 145 148*  K 3.8  --  3.7  --   --   --  4.1  CL 117*  --  117*  --   --   --  115*  CO2 30  --  29  --   --   --  28  BUN 18  --  16  --   --   --  18  CREATININE 0.91  --  0.86  --   --   --  0.86  GLUCOSE 254*  --  142*  --   --   --  251*  < > = values in this interval not displayed.  Electrolytes  Recent Labs Lab 04/05/17 0423 04/06/17 0512 04/07/17 0422  04/08/17 0518 04/09/17 0407  CALCIUM 8.3* 8.3* 8.5* 8.6* 8.3*  MG 2.0 2.2  --   --  2.4  PHOS 3.3 3.5  --   --  3.7    CBC  Recent Labs Lab 04/07/17 0422  04/07/17 2251 04/08/17 0518 04/09/17 0407  WBC 17.3*  --   --  14.3* 13.3*  HGB 7.0*  < > 7.7* 7.7* 7.3*  HCT 23.0*  < > 25.2* 24.9* 24.1*  PLT 160  --   --  175 189  < > = values in this interval not displayed.  Coag's No results for input(s): APTT, INR in the last 168 hours.  Sepsis Markers No results for input(s): LATICACIDVEN, PROCALCITON, O2SATVEN in the last 168 hours.  ABG  Recent Labs Lab 04/08/17 0417  PHART 7.407  PCO2ART 44.9  PO2ART 88.8    Liver Enzymes  Recent Labs Lab 04/04/17  0148 04/07/17 1009  AST 33 22  ALT 13* 15*  ALKPHOS 59 51  BILITOT 1.3* 0.6  ALBUMIN 1.7* 1.3*    Cardiac Enzymes  Recent Labs Lab 04/04/17 0148 04/04/17 0932 04/04/17 1500  TROPONINI 0.04* 0.03* 0.04*    Glucose  Recent Labs Lab 04/08/17 1127 04/08/17 1535 04/08/17 2037 04/09/17 0017 04/09/17 0405 04/09/17 0728  GLUCAP 140* 177* 213* 200* 252* 238*    Imaging Dg Chest Port 1 View  Result Date: 04/09/2017 CLINICAL DATA:  Acute respiratory failure. EXAM: PORTABLE CHEST 1 VIEW COMPARISON:  Yesterday. FINDINGS: Endotracheal tube in satisfactory position. Feeding tube extending into the stomach. Left jugular catheter tip in the proximal superior vena cava. Stable borderline enlarged heart and prominent interstitial markings. Left lower lobe airspace opacity is again demonstrated with decreased patchy opacity at the medial right lung base. Thoracic spine degenerative changes. IMPRESSION: 1. No significant change in left lower lobe atelectasis, pneumonia or aspiration pneumonitis. 2. Improving atelectasis or pneumonia at the medial right lung base. 3. Stable chronic interstitial lung disease and borderline cardiomegaly. Electronically Signed   By: Claudie Revering M.D.   On: 04/09/2017 07:07    STUDIES:  LP  8/24 >> glucose 205, RBC 16, protein 62, WBC 5 CT head 8/24 >> Large Rt MCA EEG 8/27 >> bilateral cerebral dysfunction, non-specific in etiology Echo 8/27 >> EF 65 to 70% CT abd/pelvis 8/30 >> third spacing, 3.5 cm low attenuation Lt hepatic lobe  CULTURES: CSF 8/24>> negative BCX 8/24>> negative UCX 8/24>> negative Sputum 8/24>> S. agalactiae  ANTIBIOTICS: vanco 8/24 >> 8/26 Zosyn 8/24 >> 8/26, 9/1 >> Ampicillin 8/27 > 8/30  SIGNIFICANT EVENTS: 8/23 Admit [facility] 8/23 8/24 Transfer to [facility] after Rt MCA CVA, start 3% NS 8/25 Decompressive craniectomy 8/27 Transfuse 1 unit PRBC 8/28 Transfuse 1 unit PRBC, A fib with RVR 8/30 Transfuse 1 unit PRBC 8/31 Off pressors, reintubated x 2, transfuse PRBC  LINES/TUBES: ETT 8/23 >> Lt IJ CVL 8/23 >>  ASSESSMENT / PLAN:  PULMONARY A: Compromised airway. Aspiration P:   Failed extubation Weaning trials. Will need to discuss with family about trach  CARDIOVASCULAR A:  Septic shock >> resolved. A fib with RVR >> back in sinus rhythm. P:  PRN lopressor Off amio since 8/31  RENAL A:   Medically induced hypernatremia. P:  Continue 3% normal saline per neurology. Increase to 40cc/hr Goal Na 150-160  GASTROINTESTINAL A:   Nutrition. Dysphagia. P:   Tube feeds PPI for ulcer prophylaxis.   HEMATOLOGIC A:   Persistent anemia >> no evidence for GI bleeding, and CT abdomen negative. No evidence of hemolysis P:  F/u CBC Transfuse for Hb < 7 Recheck LDH, haptoglobin, LFTs, blood smear  INFECTIOUS A:   Sepsis - sputum culture positive for S. Agalactiae >> completed Abx 8/30. Restarted on zosyn P:   Follow cultures  ENDOCRINE A:   DKA - resolved. DM type I. P:   SSI Levimir. Increase to 34 units  NEUROLOGIC A:   Large right MCA CVA with edema and 33mm MLS. S/p decompressive craniectomy. P:   RASS goal 0 to -1 Neurosurgery s/o 04/02/17 >> recommend d/cing sutures 10 to 14 days after surgery, and f/u with  neurosurgery in 3 months to assess for cranioplasty Continue ASA   The patient is critically ill with multiple organ system failure and requires high complexity decision making for assessment and support, frequent evaluation and titration of therapies, advanced monitoring, review of radiographic studies and interpretation of complex  data.   Critical Care Time devoted to patient care services, exclusive of separately billable procedures, described in this note is 35 minutes.   Marshell Garfinkel MD [facility] Pager [phone] If no answer or after 3pm call: [phone] 04/09/2017, 8:31 AM

## 2017-04-10 ENCOUNTER — Inpatient Hospital Stay (HOSPITAL_COMMUNITY): Payer: Medicaid Other

## 2017-04-10 LAB — SODIUM
Sodium: 152 mmol/L — ABNORMAL HIGH (ref 135–145)
Sodium: 145 mmol/L (ref 135–145)
Sodium: 142 mmol/L (ref 135–145)

## 2017-04-10 LAB — BASIC METABOLIC PANEL
ANION GAP: 9 (ref 5–15)
BUN: 13 mg/dL (ref 6–20)
CO2: 25 mmol/L (ref 22–32)
Calcium: 8.5 mg/dL — ABNORMAL LOW (ref 8.9–10.3)
Chloride: 115 mmol/L — ABNORMAL HIGH (ref 101–111)
Creatinine, Ser: 0.81 mg/dL (ref 0.61–1.24)
GLUCOSE: 205 mg/dL — AB (ref 65–99)
POTASSIUM: 4.1 mmol/L (ref 3.5–5.1)
Sodium: 149 mmol/L — ABNORMAL HIGH (ref 135–145)

## 2017-04-10 LAB — CBC
HCT: 24.6 % — ABNORMAL LOW (ref 39.0–52.0)
Hemoglobin: 7.1 g/dL — ABNORMAL LOW (ref 13.0–17.0)
MCH: 28.6 pg (ref 26.0–34.0)
MCHC: 28.9 g/dL — ABNORMAL LOW (ref 30.0–36.0)
MCV: 99.2 fL (ref 78.0–100.0)
PLATELETS: 213 10*3/uL (ref 150–400)
RBC: 2.48 MIL/uL — AB (ref 4.22–5.81)
RDW: 20.8 % — ABNORMAL HIGH (ref 11.5–15.5)
WBC: 15 10*3/uL — AB (ref 4.0–10.5)

## 2017-04-10 LAB — GLUCOSE, CAPILLARY
GLUCOSE-CAPILLARY: 189 mg/dL — AB (ref 65–99)
GLUCOSE-CAPILLARY: 190 mg/dL — AB (ref 65–99)
GLUCOSE-CAPILLARY: 206 mg/dL — AB (ref 65–99)
GLUCOSE-CAPILLARY: 207 mg/dL — AB (ref 65–99)
Glucose-Capillary: 211 mg/dL — ABNORMAL HIGH (ref 65–99)
Glucose-Capillary: 154 mg/dL — ABNORMAL HIGH (ref 65–99)
Glucose-Capillary: 186 mg/dL — ABNORMAL HIGH (ref 65–99)

## 2017-04-10 LAB — CULTURE, RESPIRATORY

## 2017-04-10 LAB — CULTURE, RESPIRATORY W GRAM STAIN: Culture: NORMAL

## 2017-04-10 MED ORDER — GERHARDT'S BUTT CREAM
TOPICAL_CREAM | Freq: Two times a day (BID) | CUTANEOUS | Status: DC
  Administered 2017-04-10: 1 via TOPICAL
  Administered 2017-04-10 – 2017-04-11 (×3): via TOPICAL
  Administered 2017-04-12: 1 via TOPICAL
  Administered 2017-04-13: 09:00:00 via TOPICAL
  Administered 2017-04-13: 1 via TOPICAL
  Administered 2017-04-13 – 2017-04-14 (×2): via TOPICAL
  Administered 2017-04-15: 1 via TOPICAL
  Administered 2017-04-15: 11:00:00 via TOPICAL
  Administered 2017-04-16: 1 via TOPICAL
  Administered 2017-04-16 – 2017-04-18 (×4): via TOPICAL
  Administered 2017-04-18 – 2017-04-20 (×4): 1 via TOPICAL
  Administered 2017-04-20: 09:00:00 via TOPICAL
  Administered 2017-04-21: 1 via TOPICAL
  Administered 2017-04-21 – 2017-04-24 (×4): via TOPICAL
  Administered 2017-04-25: 1 via TOPICAL
  Filled 2017-04-10 (×2): qty 1

## 2017-04-10 MED ORDER — PANTOPRAZOLE SODIUM 40 MG IV SOLR
40.0000 mg | Freq: Two times a day (BID) | INTRAVENOUS | Status: DC
  Administered 2017-04-10 – 2017-04-16 (×13): 40 mg via INTRAVENOUS
  Filled 2017-04-10 (×13): qty 40

## 2017-04-10 NOTE — Progress Notes (Signed)
Pt. Was transported to CT and back to 4N25 without any complications.

## 2017-04-10 NOTE — Progress Notes (Signed)
STROKE TEAM PROGRESS NOTE   SUBJECTIVE (INTERVAL HISTORY)  . No neuro changes. Hb remains stable at 24.6after  PRBC transfusion  n. No source of bleeding found.  Repeat CT scan of the head done today shows stable appearance of the cytotoxic edema without any increase. serum sodium this morning is 149 and he is still on hypertonic saline but it has been 11 days since his stroke OBJECTIVE Temp:  [98.2 F (36.8 C)-98.9 F (37.2 C)] 98.3 F (36.8 C) (09/03 1200) Pulse Rate:  [68-108] 88 (09/03 1500) Cardiac Rhythm: Normal sinus rhythm (09/03 1200) Resp:  [11-24] 18 (09/03 1500) BP: (108-155)/(59-95) 109/95 (09/03 1500) SpO2:  [97 %-100 %] 99 % (09/03 1500) FiO2 (%):  [30 %] 30 % (09/03 1200) Weight:  [222 lb 14.2 oz (101.1 kg)] 222 lb 14.2 oz (101.1 kg) (09/03 0500) Examination:  . Afebrile. Head is nontraumatic. Neck is supple without bruit.    Cardiac exam no murmur or gallop. Lungs are clear to auscultation. Distal pulses are well felt.   Lungs : CTA Heart: RRR Abdomen: soft NT Neurological Exam :  Still intubated, drowsy but eyes open on voice. Follows simple commands on the right UE and LE. Right gaze preference, not able to cross midline to the left. Pupils 3 mm reactive to light. Fundi could not be visualized. Corneal, gag and cough are present. Patient is able to move right upper and lower extremity spontaneously and against gravity with brisk withdrawal response to pain. Dense left hemiplegia with decreased tone. Reflexes present on the right and absent on the left. Left plantar upgoing right equivocal.   CBC:   Recent Labs Lab 04/09/17 0407 04/09/17 1100 04/10/17 0822  WBC 13.3*  --  15.0*  HGB 7.3*  --  7.1*  HCT 24.1*  --  24.6*  MCV 96.0  --  99.2  PLT 189 186 295    Basic Metabolic Panel:   Recent Labs Lab 04/06/17 0512  04/09/17 0407  04/10/17 0822 04/10/17 1431  NA 154*  < > 148*  < > 149* 145  K 3.6  < > 4.1  --  4.1  --   CL 122*  < > 115*  --  115*   --   CO2 28  < > 28  --  25  --   GLUCOSE 248*  < > 251*  --  205*  --   BUN 18  < > 18  --  13  --   CREATININE 0.94  < > 0.86  --  0.81  --   CALCIUM 8.3*  < > 8.3*  --  8.5*  --   MG 2.2  --  2.4  --   --   --   PHOS 3.5  --  3.7  --   --   --   < > = values in this interval not displayed.  Lipid Panel:     Component Value Date/Time   CHOL 93 04/02/2017 0607   TRIG 135 04/02/2017 0607   HDL 28 (L) 04/02/2017 0607   CHOLHDL 3.3 04/02/2017 0607   VLDL 27 04/02/2017 0607   LDLCALC 38 04/02/2017 0607   HgbA1c:  Lab Results  Component Value Date   HGBA1C 14.3 (H) 04/02/2017   Urine Drug Screen: No results found for: LABOPIA, COCAINSCRNUR, LABBENZ, AMPHETMU, THCU, LABBARB  Alcohol Level No results found for: ETH  IMAGING   Ct Head Wo Contrast 03/31/2017 1. Progressive large right MCA territory infarct with  involvement of the right occipital lobe likely related to a fetal type right posterior cerebral artery.  2. Diffuse edema and mass effect with 12 mm midline shift and partial effacement of the para mesencephalic cisterns.  3. Developing left posterior watershed territory infarct.  4. Nasal fluid and bilateral sinus disease likely related to intubation.   Ct Head Wo Contrast 03/30/2017 No acute intracranial abnormality identified. Unremarkable CT of the brain.   Ct Angio Head and neck W Or Wo Contrast 04/02/2017 IMPRESSION:  1. Occlusion of the right middle cerebral artery proximal M1 segment with extensive cytotoxic edema throughout the entire right MCA distribution.  2. Status post right pterional craniectomy for decompression with unchanged leftward midline shift measuring 8 mm.  3. No hemorrhage.  4. Normal carotid arteries.  No embolic source identified.   LE venous doppler - No evidence of deep vein thrombosis or baker's cysts bilaterally.  EEG - This EEG is abnormal due to bilateral slowing with suppression of the right hemisphere. Clinical Correlation of the  above findings indicates bilateral cerebral dysfunction that is non-specific in etiology and can be seen with hypoxic/ischemic injury, toxic/metabolic encephalopathies, neurodegenerative disorders, or medication effect.  The absence of epileptiform discharges does not rule out a clinical diagnosis of epilepsy.  Clinical correlation is advised.  TTE Left ventricle: The cavity size was normal. There was severe   concentric hypertrophy. Systolic function was vigorous. The   estimated ejection fraction was in the range of 65% to 70%. Wall   motion was normal; there were no regional wall motion   abnormalities. Left ventricular diastolic function parameters   were normal. - Atrial septum: There was increased thickness of the septum,   consistent with lipomatous hypertrophy. - Pulmonary arteries: Systolic pressure could not be accurately   estimated.  Ct Head Wo Contrast 04/05/2017 IMPRESSION:  1. Evolving acute extensive RIGHT cerebrum infarcts including RIGHT MCA, to lesser extent RIGHT ACA and RIGHT PCA territories. Petechial hemorrhage without hemorrhagic conversion.  2. Similar 7 mm RIGHT to LEFT midline shift without ventricular entrapment. Similar external herniation via RIGHT craniectomy defect.  3. Similar acute small LEFT parietal lobe infarct.   Ct Abdomen Pelvis Wo Contrast 04/06/2017 IMPRESSION:  No evidence of retroperitoneal hematoma. Diffuse mesenteric and retroperitoneal soft tissue stranding and body wall edema, consistent with third spacing. No evidence of focal inflammatory process or abscess. Tiny left pleural effusion and bibasilar atelectasis noted. Indeterminate 3.5 cm low-attenuation lesion in left hepatic lobe. Consider further characterization with hepatic protocol abdomen CT with contrast (or MRI if patient can cooperate with breath-holding).    PHYSICAL EXAM   Vitals:   04/10/17 1200 04/10/17 1300 04/10/17 1400 04/10/17 1500  BP: 134/78 135/80 (!) 151/88 (!) 109/95   Pulse: 93 87 100 88  Resp: 17 15 (!) 22 18  Temp: 98.3 F (36.8 C)     TempSrc: Axillary     SpO2: 97% 98% 100% 99%  Weight:      Height:       Middle-age African-American male who is sedated and intubated. Craniotomy bandage on the right skull. Afebrile. Head is nontraumatic. Cardiac exam no murmur or gallop. Distal pulses are well felt. Neurological Exam :  Still intubated, eyes open on voice. Follows simple commands on the right UE and LE. Right gaze preference, not able to cross midline to the left. Pupils 3 mm reactive to light. Fundi could not be visualized. Corneal, gag and cough are present. Patient is able to move right upper  and lower extremity spontaneously and against gravity with brisk withdrawal response to pain. Dense left hemiplegia with decreased tone. Reflexes present on the right and absent on the left. Left plantar upgoing right equivocal. Sensation, coordination and gait not tested.   ASSESSMENT/PLAN Mr. Lot Medford is a 56 y.o. male with history of diabetes mellitus (other past medical history unknown at this time) presenting with unresponsiveness. He did not receive IV t-PA due to decompressive right frontotemporoparietal craniectomy for decompression.  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 S/p right decompressive hemicraniectomy.   Resultant  left hemiplegia and right gaze   CT head - Progressive large right MCA territory infarct with involvement of the right occipital lobe likely related to a fetal type right PCA. Diffuse edema and mass effect. Developing left posterior watershed territory infarct.   CTA head and neck - right M1 cut off, b/l fetal PCAs with dimunitive BA and VAs  MRI head - consider once stabilized  2D Echo - EF 65-70%  EEG no seizure  LDL - 38   HgbA1c - 14.3  VTE prophylaxis - SCDs (not on lovenox due to critical anemia needed repeated PRBC) Diet NPO time  specified  No antithrombotic prior to admission, now on ASA 325mg   Patient will be counseled to be compliant with his antithrombotic medications  Ongoing aggressive stroke risk factor management  Therapy recommendations:  pending  Disposition:  Pending  Cerebral edema  CT head large right MCA infarct with diffuse edema and midline shift  Status post decompressive hemicraniectomy  Neurosurgery on board  Repeat CT evolving right malignant MCA, stable   Sodium 158->155->158->154->150    on 3% saline plan to taper and stop over next 2 days  Sodium goal of 150-160, continue monitoring Q6h  PAF - could be potential source of current stroke  Confirmed with EKG  HR can go up to 140-150s  Reversed by amiodarone  Now off amiodarone  CCM on board  Acute blood loss anemia - no source found so far  Source unclear, could be due to surgery  Hb 12.6 - 10.9 - 8.2 - 7.0 - 6.2 - PRBC - 7.5 -8.1-> 6.6->PRBC->7.2->6.7-> PRBC->7.8->7.0->6.6 - PRBC  S/p repeated PRBC transfusion  CT abdomen - no retroperitoneal hematoma  No bloody or dark bowel movement  Stool occult blood - pending ( ordered 04/06/2017 - spoke with nursing today to reinforce need)  CBC monitoring - H/H 7.8/25.8 Thursday - recheck in AM  Diabetes with DKA  HgbA1c 14.3, goal < 7.0  Uncontrolled  Off insulin drip, on Levemir  Still significant hyperglycemia  DM nurse on board, appreciate help  CCM on board  Leukocytosis,? Sepsis  WBC 18.8 -> 26.2->25.5->24.0->20.1->17.3  Off Abx  off Neo now  CCM on board  Hypertension  Stable off Neo for hypotension BP goal 120-160 Long-term BP goal normotensive  Other Stroke Risk Factors  Obesity, Body mass index is 31.98 kg/m., recommend weight loss, diet and exercise as appropriate   Other Active Problems  Induced hypernatremia  Thrombocytopenia - resolved  TF @ 75  Cortrak Tube - placed   04/08/2017  Hospital day # 9 Plan the patient  likely needs prolonged ventilatory and feeding support and needs tracheostomy and PEG tube this coming week. No family available at bedside for discussion. This patient is critically ill due to right malignant MCA infarct status post hemicraniectomy, embolic infarcts to left MCA, PAF, diabetes and DKA, sepsis,  leukocytosis, question procedure and at significant risk of neurological worsening, death form cerebral edema, ankle herniation, heart failure, status epilepticus, septic shock, DKA, hemorrhagic transformation. This patient's care requires constant monitoring of vital signs, hemodynamics, respiratory and cardiac monitoring, review of multiple databases, neurological assessment, discussion with family, other specialists and medical decision making of high complexity. I spent 32 minutes of neurocritical care time in the care of this patient. I have personally examined this patient, reviewed notes, independently viewed imaging studies, participated in medical decision making and plan of care.ROS completed by me personally and pertinent positives fully documented  I have made any additions or clarifications directly to the above note.    Antony Contras, MD Medical Director [place] Pager: [phone] 04/10/2017 3:47 PM    To contact Stroke Continuity provider, please refer to [email or web address] After hours, contact General Neurology

## 2017-04-10 NOTE — Consult Note (Signed)
[place] Nurse wound consult note Reason for Consult: Purulent lesion to right buttock in gluteal fold.   Wound type:Inflammatory lesion Pressure Injury POA: NA Measurement: 0.2 cm opening with purulence draining from it.   Wound HCW:CBJSEG to assess due to size of opening Drainage (amount, consistency, odor) Purulent   Yellow with musty odor. Periwound:Rectum with moisture associated skin damage from 3 to 6 o'clock.  Will begin moisture barrier to this area.  Gerhardt cream, mixture of Nystatin, zinc oxide and hydrocortisone Dressing procedure/placement/frequency:Cleanse buttocks twice daily and PRN soilage with soap and water and apply Gerhardt cream near rectum and gluteal fold.   Will not follow at this time.  Please re-consult if needed.  Domenic Moras RN BSN [place] Pager [phone]

## 2017-04-10 NOTE — Progress Notes (Addendum)
PULMONARY / CRITICAL CARE MEDICINE   Name: Joe Wilkinson MRN: [id number] DOB: [birth year]    ADMISSION DATE:  03/31/2017 CHIEF COMPLAINT:  CVA  HISTORY OF PRESENT ILLNESS:   56 yo male brought to [facility] with altered mental status from DKA and sepsis.  Found to have RT MCA CVA and [facility].  Required Rt decompressive craniectomy and remained on vent post op.  SUBJECTIVE:  On vent. No issues overnight.  VITAL SIGNS: BP 120/65   Pulse 74   Temp 98.3 F (36.8 C) (Axillary)   Resp 17   Ht 5\' 10"  (1.778 m)   Wt 222 lb 14.2 oz (101.1 kg)   SpO2 100%   BMI 31.98 kg/m   VENTILATOR SETTINGS: Vent Mode: PRVC FiO2 (%):  [30 %] 30 % Set Rate:  [16 bmp] 16 bmp Vt Set:  [550 mL] 550 mL PEEP:  [5 cmH20] 5 cmH20 Pressure Support:  [5 cmH20] 5 cmH20 Plateau Pressure:  [12 cmH20-16 cmH20] 12 cmH20  INTAKE / OUTPUT: I/O last 3 completed shifts: In: 4638.8 [I.V.:2013.8; NG/GT:2625] Out: 4125 [Urine:4125]  PHYSICAL EXAMINATION: Gen:      No acute distress HEENT:  EOMI, sclera anicteric, ETT in place Neck:     No masses; no thyromegaly Lungs:    Clear to auscultation bilaterally; normal respiratory effort CV:         Regular rate and rhythm; no murmurs Abd:      + bowel sounds; soft, non-tender; no palpable masses, no distension Ext:    No edema; adequate peripheral perfusion Skin:      Warm and dry; no rash Neuro: Somnolent, arousable  BMET  Recent Labs Lab 04/07/17 0422  04/08/17 0518  04/09/17 0407  04/09/17 1511 04/09/17 2015 04/10/17 0215  NA 150*  < > 151*  < > 148*  < > 147* 145 152*  K 3.8  --  3.7  --  4.1  --   --   --   --   CL 117*  --  117*  --  115*  --   --   --   --   CO2 30  --  29  --  28  --   --   --   --   BUN 18  --  16  --  18  --   --   --   --   CREATININE 0.91  --  0.86  --  0.86  --   --   --   --   GLUCOSE 254*  --  142*  --  251*  --   --   --   --   < > = values in this interval not displayed.  Electrolytes  Recent Labs Lab  04/05/17 0423 04/06/17 0512 04/07/17 0422 04/08/17 0518 04/09/17 0407  CALCIUM 8.3* 8.3* 8.5* 8.6* 8.3*  MG 2.0 2.2  --   --  2.4  PHOS 3.3 3.5  --   --  3.7    CBC  Recent Labs Lab 04/07/17 0422  04/07/17 2251 04/08/17 0518 04/09/17 0407 04/09/17 1100  WBC 17.3*  --   --  14.3* 13.3*  --   HGB 7.0*  < > 7.7* 7.7* 7.3*  --   HCT 23.0*  < > 25.2* 24.9* 24.1*  --   PLT 160  --   --  175 189 186  < > = values in this interval not displayed.  Coag's  Recent Labs Lab 04/09/17 1100  APTT 32  INR 1.15    Sepsis Markers No results for input(s): LATICACIDVEN, PROCALCITON, O2SATVEN in the last 168 hours.  ABG  Recent Labs Lab 04/08/17 0417  PHART 7.407  PCO2ART 44.9  PO2ART 88.8    Liver Enzymes  Recent Labs Lab 04/04/17 0148 04/07/17 1009  AST 33 22  ALT 13* 15*  ALKPHOS 59 51  BILITOT 1.3* 0.6  ALBUMIN 1.7* 1.3*    Cardiac Enzymes  Recent Labs Lab 04/04/17 0148 04/04/17 0932 04/04/17 1500  TROPONINI 0.04* 0.03* 0.04*    Glucose  Recent Labs Lab 04/09/17 1214 04/09/17 1601 04/09/17 2051 04/10/17 0005 04/10/17 0335 04/10/17 0808  GLUCAP 241* 214* 207* 207* 211* 206*    Imaging No results found.  STUDIES:  LP 8/24 >> glucose 205, RBC 16, protein 62, WBC 5 CT head 8/24 >> Large Rt MCA EEG 8/27 >> bilateral cerebral dysfunction, non-specific in etiology Echo 8/27 >> EF 65 to 70% CT abd/pelvis 8/30 >> third spacing, 3.5 cm low attenuation Lt hepatic lobe  CULTURES: CSF 8/24>> negative BCX 8/24>> negative UCX 8/24>> negative Sputum 8/24>> S. agalactiae  ANTIBIOTICS: vanco 8/24 >> 8/26 Zosyn 8/24 >> 8/26, 9/1 >> Ampicillin 8/27 > 8/30  SIGNIFICANT EVENTS: 8/23 Admit [facility] 8/23 8/24 Transfer to [facility] after Rt MCA CVA, start 3% NS 8/25 Decompressive craniectomy 8/27 Transfuse 1 unit PRBC 8/28 Transfuse 1 unit PRBC, A fib with RVR 8/30 Transfuse 1 unit PRBC 8/31 Off pressors, reintubated x 2, transfuse PRBC  LINES/TUBES: ETT  8/23 >> Lt IJ CVL 8/23 >>  ASSESSMENT / PLAN:  PULMONARY A: Compromised airway. Aspiration P:   Failed extubation Weaning trials. We may attempt another trial of extubation. Will need a trach if he fails again.   CARDIOVASCULAR A:  Septic shock >> resolved. A fib with RVR >> back in sinus rhythm. P:  PRN lopressor Off amio since 8/31  RENAL A:   Medically induced hypernatremia. P:  Continue 3% normal saline per neurology. Goal Na 150-160  GASTROINTESTINAL A:   Nutrition. Dysphagia. Low Hb but evidence of active bleed P:   Tube feeds PPI for ulcer prophylaxis. Increase to q12 as he may have stress gastritis  HEMATOLOGIC A:   Persistent anemia >> no evidence for GI bleeding, and CT abdomen negative. No evidence of hemolysis. No schistocytes P:  F/u CBC Transfuse for Hb < 7  INFECTIOUS A:   Sepsis - sputum culture positive for S. Agalactiae >> completed Abx 8/30. Restarted on zosyn P:   Follow cultures  ENDOCRINE A:   DKA - resolved. DM type I. P:   SSI Levimir. Increase to 36 units  NEUROLOGIC A:   Large right MCA CVA with edema and 20mm MLS. S/p decompressive craniectomy. P:   RASS goal 0 to -1 Neurosurgery s/o 04/02/17 >> recommend d/cing sutures 10 to 14 days after surgery, and f/u with neurosurgery in 3 months to assess for cranioplasty Continue ASA   Family: I called the aunt in [place] to discuss the plan of care. She wants to talk about this further with her niece on [place]. If he does need a trach they want him moved to a facility in [place]  The patient is critically ill with multiple organ system failure and requires high complexity decision making for assessment and support, frequent evaluation and titration of therapies, advanced monitoring, review of radiographic studies and interpretation of complex data.   Critical Care Time devoted to patient care services, exclusive of separately billable  procedures, described in this note is 35  minutes.   Marshell Garfinkel MD [facility] Pager [phone] If no answer or after 3pm call: [phone] 04/10/2017, 8:10 AM

## 2017-04-10 NOTE — Progress Notes (Signed)
1230 assessed central line. Bloody drainage noted. Sterile dressing changed and central line appeared to be pulled out some. CCM notified and orders for stat chest xray obtained. Called Dr. Vaughan Browner with results and orders given to hold 3% saline and to remove central line once adequate IV access obtained. IV team consult ordered. Paged stroke team regarding 3% saline on hold. Orders given to taper 3% saline and MD order to run 3% saline through peripheral IV.

## 2017-04-10 NOTE — Progress Notes (Signed)
Central line is displaced. Reviewed CXR. Tip is in brachiocephalic  Will get peripheral and remove the CVL altogether. If we need to continue 3% saline then will get a PICC in place.  Marshell Garfinkel MD [facility] Pager [phone] If no answer or after 3pm call: [phone] 04/10/2017, 12:54 PM

## 2017-04-10 NOTE — Progress Notes (Signed)
Vital signs are stable Flap appears to be unchanged from yesterday Dr. Cyndy Freeze will follow-up tomorrow

## 2017-04-11 ENCOUNTER — Inpatient Hospital Stay (HOSPITAL_COMMUNITY): Payer: Medicaid Other

## 2017-04-11 DIAGNOSIS — I63413 Cerebral infarction due to embolism of bilateral middle cerebral arteries: Secondary | ICD-10-CM

## 2017-04-11 DIAGNOSIS — I10 Essential (primary) hypertension: Secondary | ICD-10-CM

## 2017-04-11 DIAGNOSIS — Z515 Encounter for palliative care: Secondary | ICD-10-CM

## 2017-04-11 LAB — GLUCOSE, CAPILLARY
GLUCOSE-CAPILLARY: 177 mg/dL — AB (ref 65–99)
GLUCOSE-CAPILLARY: 230 mg/dL — AB (ref 65–99)
Glucose-Capillary: 204 mg/dL — ABNORMAL HIGH (ref 65–99)
Glucose-Capillary: 244 mg/dL — ABNORMAL HIGH (ref 65–99)
Glucose-Capillary: 174 mg/dL — ABNORMAL HIGH (ref 65–99)
Glucose-Capillary: 149 mg/dL — ABNORMAL HIGH (ref 65–99)

## 2017-04-11 LAB — SODIUM
SODIUM: 141 mmol/L (ref 135–145)
Sodium: 142 mmol/L (ref 135–145)
Sodium: 139 mmol/L (ref 135–145)

## 2017-04-11 LAB — CBC
HEMATOCRIT: 25.9 % — AB (ref 39.0–52.0)
Hemoglobin: 7.7 g/dL — ABNORMAL LOW (ref 13.0–17.0)
MCH: 29.2 pg (ref 26.0–34.0)
MCHC: 29.7 g/dL — ABNORMAL LOW (ref 30.0–36.0)
MCV: 98.1 fL (ref 78.0–100.0)
PLATELETS: 251 10*3/uL (ref 150–400)
RBC: 2.64 MIL/uL — AB (ref 4.22–5.81)
RDW: 20.4 % — AB (ref 11.5–15.5)
WBC: 16.8 10*3/uL — AB (ref 4.0–10.5)

## 2017-04-11 LAB — BASIC METABOLIC PANEL
ANION GAP: 5 (ref 5–15)
BUN: 12 mg/dL (ref 6–20)
CALCIUM: 8.7 mg/dL — AB (ref 8.9–10.3)
CO2: 27 mmol/L (ref 22–32)
Chloride: 111 mmol/L (ref 101–111)
Creatinine, Ser: 0.82 mg/dL (ref 0.61–1.24)
GFR calc non Af Amer: >60 mL/min (ref >60)
Glucose, Bld: 209 mg/dL — ABNORMAL HIGH (ref 65–99)
POTASSIUM: 3.9 mmol/L (ref 3.5–5.1)
Sodium: 143 mmol/L (ref 135–145)

## 2017-04-11 LAB — PHOSPHORUS: PHOSPHORUS: 3.6 mg/dL (ref 2.5–4.6)

## 2017-04-11 LAB — HAPTOGLOBIN: HAPTOGLOBIN: 247 mg/dL — AB (ref 34–200)

## 2017-04-11 LAB — MAGNESIUM: Magnesium: 2.1 mg/dL (ref 1.7–2.4)

## 2017-04-11 MED ORDER — VECURONIUM BROMIDE 10 MG IV SOLR
10.0000 mg | Freq: Once | INTRAVENOUS | Status: AC
  Administered 2017-04-12: 10 mg via INTRAVENOUS
  Filled 2017-04-11: qty 10

## 2017-04-11 MED ORDER — FENTANYL CITRATE (PF) 100 MCG/2ML IJ SOLN
200.0000 ug | Freq: Once | INTRAMUSCULAR | Status: AC
  Administered 2017-04-12: 100 ug via INTRAVENOUS
  Filled 2017-04-11: qty 4

## 2017-04-11 MED ORDER — ETOMIDATE 2 MG/ML IV SOLN
40.0000 mg | Freq: Once | INTRAVENOUS | Status: AC
  Administered 2017-04-12: 20 mg via INTRAVENOUS
  Filled 2017-04-11 (×2): qty 20

## 2017-04-11 MED ORDER — PRO-STAT SUGAR FREE PO LIQD
30.0000 mL | Freq: Two times a day (BID) | ORAL | Status: DC
  Administered 2017-04-11 – 2017-04-12 (×4): 30 mL
  Filled 2017-04-11 (×5): qty 30

## 2017-04-11 MED ORDER — MIDAZOLAM HCL 2 MG/2ML IJ SOLN
4.0000 mg | Freq: Once | INTRAMUSCULAR | Status: AC
  Administered 2017-04-12: 2 mg via INTRAVENOUS
  Filled 2017-04-11: qty 4

## 2017-04-11 MED ORDER — GLUCERNA 1.2 CAL PO LIQD
1000.0000 mL | ORAL | Status: DC
  Administered 2017-04-11 – 2017-04-12 (×2): 1000 mL
  Filled 2017-04-11 (×5): qty 1000

## 2017-04-11 MED ORDER — PROPOFOL 500 MG/50ML IV EMUL
5.0000 ug/kg/min | Freq: Once | INTRAVENOUS | Status: DC
  Filled 2017-04-11: qty 50

## 2017-04-11 NOTE — Progress Notes (Signed)
Wound check Craniotomy site: Staples present. No redness, drainage, warmth. Minimal swelling. No sign of infection. No new recs. Continue with plan for removal of staples in 3-5 days

## 2017-04-11 NOTE — Progress Notes (Signed)
STROKE TEAM PROGRESS NOTE   SUBJECTIVE (INTERVAL HISTORY)  . No neuro changes. Hb remains stable at 24.6after  PRBC transfusion  n. No source of bleeding found.  Repeat CT scan of the head done today shows stable appearance of the cytotoxic edema without any increase. serum sodium this morning is 149 and he is still on hypertonic saline but it has been 11 days since his stroke OBJECTIVE Temp:  [98.5 F (36.9 C)-100.2 F (37.9 C)] 99.5 F (37.5 C) (09/04 1200) Pulse Rate:  [78-118] 98 (09/04 1250) Cardiac Rhythm: Normal sinus rhythm (09/04 0400) Resp:  [15-25] 25 (09/04 1250) BP: (130-172)/(45-97) 156/87 (09/04 1250) SpO2:  [97 %-100 %] 98 % (09/04 1250) FiO2 (%):  [30 %] 30 % (09/04 1250) Weight:  [222 lb 3.6 oz (100.8 kg)-222 lb 10.6 oz (101 kg)] 222 lb 3.6 oz (100.8 kg) (09/04 1118) Examination:  . Afebrile. Head is nontraumatic. Neck is supple without bruit.    Cardiac exam no murmur or gallop. Lungs are clear to auscultation. Distal pulses are well felt.   Lungs : CTA Heart: RRR Abdomen: soft NT Neurological Exam :  Still intubated, drowsy and barely eyes open on voice. Follows simple commands on the right UE and LE. Right gaze preference, not able to cross midline to the left. Pupils 3 mm reactive to light. Fundi could not be visualized. Corneal, gag and cough are present. Patient is able to move right upper and lower extremity spontaneously and against gravity with brisk withdrawal response to pain. Dense left hemiplegia with decreased tone. Reflexes present on the right and absent on the left. Left plantar upgoing right equivocal.   CBC:   Recent Labs Lab 04/10/17 0822 04/11/17 0219  WBC 15.0* 16.8*  HGB 7.1* 7.7*  HCT 24.6* 25.9*  MCV 99.2 98.1  PLT 213 643    Basic Metabolic Panel:   Recent Labs Lab 04/09/17 0407  04/10/17 0822  04/11/17 0219 04/11/17 0834 04/11/17 1409  NA 148*  < > 149*  < > 143 141 142  K 4.1  --  4.1  --  3.9  --   --   CL 115*  --   115*  --  111  --   --   CO2 28  --  25  --  27  --   --   GLUCOSE 251*  --  205*  --  209*  --   --   BUN 18  --  13  --  12  --   --   CREATININE 0.86  --  0.81  --  0.82  --   --   CALCIUM 8.3*  --  8.5*  --  8.7*  --   --   MG 2.4  --   --   --  2.1  --   --   PHOS 3.7  --   --   --  3.6  --   --   < > = values in this interval not displayed.  Lipid Panel:     Component Value Date/Time   CHOL 93 04/02/2017 0607   TRIG 135 04/02/2017 0607   HDL 28 (L) 04/02/2017 0607   CHOLHDL 3.3 04/02/2017 0607   VLDL 27 04/02/2017 0607   LDLCALC 38 04/02/2017 0607   HgbA1c:  Lab Results  Component Value Date   HGBA1C 14.3 (H) 04/02/2017   Urine Drug Screen: No results found for: LABOPIA, COCAINSCRNUR, LABBENZ, AMPHETMU, THCU, LABBARB  Alcohol Level No  results found for: ETH  IMAGING   Ct Head Wo Contrast 03/31/2017 1. Progressive large right MCA territory infarct with involvement of the right occipital lobe likely related to a fetal type right posterior cerebral artery.  2. Diffuse edema and mass effect with 12 mm midline shift and partial effacement of the para mesencephalic cisterns.  3. Developing left posterior watershed territory infarct.  4. Nasal fluid and bilateral sinus disease likely related to intubation.   Ct Head Wo Contrast 03/30/2017 No acute intracranial abnormality identified. Unremarkable CT of the brain.   Ct Angio Head and neck W Or Wo Contrast 04/02/2017 IMPRESSION:  1. Occlusion of the right middle cerebral artery proximal M1 segment with extensive cytotoxic edema throughout the entire right MCA distribution.  2. Status post right pterional craniectomy for decompression with unchanged leftward midline shift measuring 8 mm.  3. No hemorrhage.  4. Normal carotid arteries.  No embolic source identified.   LE venous doppler - No evidence of deep vein thrombosis or baker's cysts bilaterally.  EEG - This EEG is abnormal due to bilateral slowing with suppression  of the right hemisphere. Clinical Correlation of the above findings indicates bilateral cerebral dysfunction that is non-specific in etiology and can be seen with hypoxic/ischemic injury, toxic/metabolic encephalopathies, neurodegenerative disorders, or medication effect.  The absence of epileptiform discharges does not rule out a clinical diagnosis of epilepsy.  Clinical correlation is advised.  TTE Left ventricle: The cavity size was normal. There was severe   concentric hypertrophy. Systolic function was vigorous. The   estimated ejection fraction was in the range of 65% to 70%. Wall   motion was normal; there were no regional wall motion   abnormalities. Left ventricular diastolic function parameters   were normal. - Atrial septum: There was increased thickness of the septum,   consistent with lipomatous hypertrophy. - Pulmonary arteries: Systolic pressure could not be accurately   estimated.  Ct Head Wo Contrast 04/05/2017 IMPRESSION:  1. Evolving acute extensive RIGHT cerebrum infarcts including RIGHT MCA, to lesser extent RIGHT ACA and RIGHT PCA territories. Petechial hemorrhage without hemorrhagic conversion.  2. Similar 7 mm RIGHT to LEFT midline shift without ventricular entrapment. Similar external herniation via RIGHT craniectomy defect.  3. Similar acute small LEFT parietal lobe infarct.   Ct Abdomen Pelvis Wo Contrast 04/06/2017 IMPRESSION:  No evidence of retroperitoneal hematoma. Diffuse mesenteric and retroperitoneal soft tissue stranding and body wall edema, consistent with third spacing. No evidence of focal inflammatory process or abscess. Tiny left pleural effusion and bibasilar atelectasis noted. Indeterminate 3.5 cm low-attenuation lesion in left hepatic lobe. Consider further characterization with hepatic protocol abdomen CT with contrast (or MRI if patient can cooperate with breath-holding).    PHYSICAL EXAM   Vitals:   04/11/17 0810 04/11/17 1118 04/11/17 1200  04/11/17 1250  BP: 130/76   (!) 156/87  Pulse: 89   98  Resp: (!) 21   (!) 25  Temp:   99.5 F (37.5 C)   TempSrc:   Axillary   SpO2: 99%   98%  Weight:  222 lb 3.6 oz (100.8 kg)    Height:       Middle-age African-American male who is sedated and intubated. Craniotomy bandage on the right skull. Afebrile. Head is nontraumatic. Cardiac exam no murmur or gallop. Distal pulses are well felt. Neurological Exam :  Still intubated, eyes open on voice. Follows simple commands on the right UE and LE. Right gaze preference, not able to cross midline to  the left. Pupils 3 mm reactive to light. Fundi could not be visualized. Corneal, gag and cough are present. Patient is able to move right upper and lower extremity spontaneously and against gravity with brisk withdrawal response to pain. Dense left hemiplegia with decreased tone. Reflexes present on the right and absent on the left. Left plantar upgoing right equivocal. Sensation, coordination and gait not tested.   ASSESSMENT/PLAN Mr. Joe Wilkinson is a 56 y.o. male with history of diabetes mellitus (other past medical history unknown at this time) presenting with unresponsiveness. He did not receive IV t-PA due to decompressive right frontotemporoparietal craniectomy for decompression.  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 S/p right decompressive hemicraniectomy.   Resultant  left hemiplegia and right gaze   CT head - Progressive large right MCA territory infarct with involvement of the right occipital lobe likely related to a fetal type right PCA. Diffuse edema and mass effect. Developing left posterior watershed territory infarct.   CTA head and neck - right M1 cut off, b/l fetal PCAs with dimunitive BA and VAs  MRI head - consider once stabilized  2D Echo - EF 65-70%  EEG no seizure  LDL - 38   HgbA1c - 14.3  VTE prophylaxis - SCDs (not on lovenox due to critical  anemia needed repeated PRBC) Diet NPO time specified Diet NPO time specified  No antithrombotic prior to admission, now on ASA 325mg   Patient will be counseled to be compliant with his antithrombotic medications  Ongoing aggressive stroke risk factor management  Therapy recommendations:  pending  Disposition:  Pending  Cerebral edema  CT head large right MCA infarct with diffuse edema and midline shift  Status post decompressive hemicraniectomy  Neurosurgery on board  Repeat CT evolving right malignant MCA, stable   Sodium 158->155->158->154->150    on 3% saline plan to taper and stop over next 2 days  Sodium goal of 150-160, continue monitoring Q6h  PAF - could be potential source of current stroke  Confirmed with EKG  HR can go up to 140-150s  Reversed by amiodarone  Now off amiodarone  CCM on board  Acute blood loss anemia - no source found so far  Source unclear, could be due to surgery  Hb 12.6 - 10.9 - 8.2 - 7.0 - 6.2 - PRBC - 7.5 -8.1-> 6.6->PRBC->7.2->6.7-> PRBC->7.8->7.0->6.6 - PRBC  S/p repeated PRBC transfusion  CT abdomen - no retroperitoneal hematoma  No bloody or dark bowel movement  Stool occult blood - pending ( ordered 04/06/2017 - spoke with nursing today to reinforce need)  CBC monitoring - H/H 7.8/25.8 Thursday - recheck in AM  Diabetes with DKA  HgbA1c 14.3, goal < 7.0  Uncontrolled  Off insulin drip, on Levemir  Still significant hyperglycemia  DM nurse on board, appreciate help  CCM on board  Leukocytosis,? Sepsis  WBC 18.8 -> 26.2->25.5->24.0->20.1->17.3  Off Abx  off Neo now  CCM on board  Hypertension  Stable off Neo for hypotension BP goal 120-160 Long-term BP goal normotensive  Other Stroke Risk Factors  Obesity, Body mass index is 31.89 kg/m., recommend weight loss, diet and exercise as appropriate   Other Active Problems  Induced hypernatremia  Thrombocytopenia - resolved  TF @  75  Cortrak Tube - placed   04/08/2017  Hospital day # 10 Plan the patient likely needs prolonged ventilatory and feeding support and needs tracheostomy and PEG tube this coming week. No  family available at bedside for discussion.D/w Dr Nelda Marseille This patient is critically ill due to right malignant MCA infarct status post hemicraniectomy, embolic infarcts to left MCA, PAF, diabetes and DKA, sepsis, leukocytosis, question procedure and at significant risk of neurological worsening, death form cerebral edema, ankle herniation, heart failure, status epilepticus, septic shock, DKA, hemorrhagic transformation. This patient's care requires constant monitoring of vital signs, hemodynamics, respiratory and cardiac monitoring, review of multiple databases, neurological assessment, discussion with family, other specialists and medical decision making of high complexity. I spent 30 minutes of neurocritical care time in the care of this patient. I have personally examined this patient, reviewed notes, independently viewed imaging studies, participated in medical decision making and plan of care.ROS completed by me personally and pertinent positives fully documented  I have made any additions or clarifications directly to the above note.    Antony Contras, MD Medical Director [place] Pager: [phone] 04/11/2017 3:56 PM    To contact Stroke Continuity provider, please refer to [email or web address] After hours, contact General Neurology

## 2017-04-11 NOTE — Progress Notes (Signed)
PULMONARY / CRITICAL CARE MEDICINE   Name: Joe Wilkinson MRN: [id number] DOB: 30-Jun-1961    ADMISSION DATE:  03/31/2017 CHIEF COMPLAINT:  CVA  HISTORY OF PRESENT ILLNESS:   56 yo male brought to [facility] with altered mental status from DKA and sepsis.  Found to have RT MCA CVA and transfer to [facility].  Required Rt decompressive craniectomy and remained on vent post op.  SUBJECTIVE:  On vent. No issues overnight. Weaning but with a weak gag.  VITAL SIGNS: BP 130/76   Pulse 89   Temp 100.2 F (37.9 C) (Axillary)   Resp (!) 21   Ht 5\' 10"  (1.778 m)   Wt 101 kg (222 lb 10.6 oz)   SpO2 99%   BMI 31.95 kg/m   VENTILATOR SETTINGS: Vent Mode: PSV;CPAP FiO2 (%):  [30 %] 30 % Set Rate:  [16 bmp] 16 bmp Vt Set:  [550 mL] 550 mL PEEP:  [5 cmH20] 5 cmH20 Pressure Support:  [5 cmH20-8 cmH20] 8 cmH20 Plateau Pressure:  [15 cmH20] 15 cmH20  INTAKE / OUTPUT: I/O last 3 completed shifts: In: 4039.1 [I.V.:1214.1; NG/GT:2625; IV Piggyback:200] Out: 4800 [Urine:4800]  PHYSICAL EXAMINATION: Gen:      In acute respiratory distress HEENT:  [place]/AT, PERRL, EOM-I and MMM Neck:      Supple, no masses Lungs:    CTA bilaterally CV:         RRR, Nl S1/S2, -M/R/G. Abd:      Soft, NT, ND and +BS Ext:    -edema and -tenderness Skin:       Warm, intact Neuro:    Somnolent, arousable  BMET  Recent Labs Lab 04/09/17 0407  04/10/17 0822  04/10/17 2002 04/11/17 0219 04/11/17 0834  NA 148*  < > 149*  < > 142 143 141  K 4.1  --  4.1  --   --  3.9  --   CL 115*  --  115*  --   --  111  --   CO2 28  --  25  --   --  27  --   BUN 18  --  13  --   --  12  --   CREATININE 0.86  --  0.81  --   --  0.82  --   GLUCOSE 251*  --  205*  --   --  209*  --   < > = values in this interval not displayed.  Electrolytes  Recent Labs Lab 04/06/17 0512  04/09/17 0407 04/10/17 0822 04/11/17 0219  CALCIUM 8.3*  < > 8.3* 8.5* 8.7*  MG 2.2  --  2.4  --  2.1  PHOS 3.5  --  3.7  --  3.6  < > = values in this  interval not displayed.  CBC  Recent Labs Lab 04/09/17 0407 04/09/17 1100 04/10/17 0822 04/11/17 0219  WBC 13.3*  --  15.0* 16.8*  HGB 7.3*  --  7.1* 7.7*  HCT 24.1*  --  24.6* 25.9*  PLT 189 186 213 251    Coag's  Recent Labs Lab 04/09/17 1100  APTT 32  INR 1.15    Sepsis Markers No results for input(s): LATICACIDVEN, PROCALCITON, O2SATVEN in the last 168 hours.  ABG  Recent Labs Lab 04/08/17 0417  PHART 7.407  PCO2ART 44.9  PO2ART 88.8    Liver Enzymes  Recent Labs Lab 04/07/17 1009  AST 22  ALT 15*  ALKPHOS 51  BILITOT 0.6  ALBUMIN 1.3*  Cardiac Enzymes  Recent Labs Lab 04/04/17 1500  TROPONINI 0.04*    Glucose  Recent Labs Lab 04/10/17 1241 04/10/17 1643 04/10/17 2005 04/10/17 2330 04/11/17 0340 04/11/17 0855  GLUCAP 189* 154* 190* 186* 204* 244*    Imaging Ct Head Wo Contrast  Result Date: 04/10/2017 CLINICAL DATA:  56 year old male status post large right MCA infarct, and decompressive craniectomy. EXAM: CT HEAD WITHOUT CONTRAST TECHNIQUE: Contiguous axial images were obtained from the base of the skull through the vertex without intravenous contrast. COMPARISON:  04/05/2017 and earlier. FINDINGS: Brain: Severe right hemisphere cytotoxic edema affecting the right MCA and also portions of the right PCA and distal ACA territories. Stable herniation of edematous brain through the craniectomy defect. Stable petechial hemorrhage. Stable intracranial mass effect with leftward midline shift of 7 mm and partially effaced right lateral ventricle. No ventriculomegaly. Basilar cisterns remain patent. There is a small area of cytotoxic edema in the left occipital lobe which is stable (series 4, image 16). No associated mass effect. Stable and otherwise normal left hemisphere and posterior fossa gray-white matter differentiation. Vascular: Negative. Skull: Sequelae of right side craniectomy. Stable visualized osseous structures. Sinuses/Orbits:  Intubated on the scout view. Right side nasal enteric tube in place. Opacified nasopharynx and nasal cavity. Stable paranasal sinus mucosal thickening and opacification. Progressed opacification of both tympanic cavities an bilateral mastoid air cells. Other: Postoperative changes to the scalp with scalp hematoma and skin staples not significantly changed. IMPRESSION: 1. Stable noncontrast CT appearance of the brain since 04/05/2017: Severe right hemisphere cytotoxic edema with stable extension of edematous brain through the right craniectomy defect. Stable petechial hemorrhage without malignant hemorrhagic transformation. Stable intracranial mass effect with leftward midline shift of 7 mm. Small left PCA territory infarct. 2. No new intracranial abnormality. 3. Increased tympanic cavity and mastoid air cell opacification in the setting of intubation. Electronically Signed   By: Genevie Ann M.D.   On: 04/10/2017 14:07   Dg Chest Port 1 View  Result Date: 04/11/2017 CLINICAL DATA:  56 year old male with acute respiratory failure and stroke. Subsequent encounter. EXAM: PORTABLE CHEST 1 VIEW COMPARISON:  04/10/2017. FINDINGS: Endotracheal tube tip 2.7 cm above the carina. Nasogastric tube courses below the diaphragm. Tip is not included on the present exam. Left central line less dense than on prior exam and tip not adequately assessed on current examination. Interval decrease in degree of pulmonary vascular congestion. Central pulmonary vascular prominence remains. Blunting left costophrenic angle may represent tiny amount of left-sided pleural fluid. Subsegmental atelectasis left mid lung. No segmental consolidation. IMPRESSION: Interval decrease in degree of pulmonary vascular congestion. Tiny left-sided pleural effusion suspected and without significant change. Left central line appears less dense than on prior exam and tip not adequately assessed on the current examination. Electronically Signed   By: Genia Del  M.D.   On: 04/11/2017 07:30   Dg Chest Port 1 View  Result Date: 04/10/2017 CLINICAL DATA:  Stroke.  Right-sided decompressive craniotomy. EXAM: PORTABLE CHEST 1 VIEW COMPARISON:  04/09/2017 FINDINGS: Endotracheal tube tip 3.4 cm above the carina. Left IJ line tip: Brachiocephalic vein. Feeding tube enters the stomach and extends beyond the inferior margin of today's chest image. Low lung volumes are present, causing crowding of the pulmonary vasculature. The patient is rotated to the left on today's radiograph, reducing diagnostic sensitivity and specificity. Bilateral mild interstitial accentuation in the lungs. Heart size within normal limits for projection. Slight blunting of the left lateral costophrenic angle. IMPRESSION: 1. Mild blunting of  the left lateral costophrenic angle possibly from atelectasis, pneumonia, or small pleural effusion. 2. Mild interstitial accentuation slightly increased from prior, query low grade interstitial edema. 3. The left IJ line tip is in the brachiocephalic vein. Electronically Signed   By: Van Clines M.D.   On: 04/10/2017 12:21    STUDIES:  LP 8/24 >> glucose 205, RBC 16, protein 62, WBC 5 CT head 8/24 >> Large Rt MCA EEG 8/27 >> bilateral cerebral dysfunction, non-specific in etiology Echo 8/27 >> EF 65 to 70% CT abd/pelvis 8/30 >> third spacing, 3.5 cm low attenuation Lt hepatic lobe  CULTURES: CSF 8/24>> negative BCX 8/24>> negative UCX 8/24>> negative Sputum 8/24>> S. agalactiae  ANTIBIOTICS: vanco 8/24 >> 8/26 Zosyn 8/24 >> 8/26, 9/1 >> Ampicillin 8/27 > 8/30  SIGNIFICANT EVENTS: 8/23 Admit [facility] 8/23 8/24 Transfer to [facility] after Rt MCA CVA, start 3% NS 8/25 Decompressive craniectomy 8/27 Transfuse 1 unit PRBC 8/28 Transfuse 1 unit PRBC, A fib with RVR 8/30 Transfuse 1 unit PRBC 8/31 Off pressors, reintubated x 2, transfuse PRBC  LINES/TUBES: ETT 8/23 >> Lt IJ CVL 8/23 >>  ASSESSMENT / PLAN:  PULMONARY A: Compromised  airway. Aspiration P:   Failed extubation on multiple occassions  Weaning trials as able Spoke with aunt, informed that we need a trach placed.  She will talk to sister today and call us back today, per neurology and previous events, trach is recommended.  CARDIOVASCULAR A:  Septic shock >> resolved. A fib with RVR >> back in sinus rhythm. P:  PRN lopressor Off amio since 8/31 Tele monitoring  RENAL A:   Medically induced hypernatremia. P:  Sodium management per neurology BMET in AM Replace electrolytes as indicated Goal Na 150-160  GASTROINTESTINAL A:   Nutrition. Dysphagia. Low Hb but evidence of active bleed P:   Tube feeds per nutrition Will need PEG PPI for ulcer prophylaxis. Increase to q12 as he may have stress gastritis  HEMATOLOGIC A:   Persistent anemia >> no evidence for GI bleeding, and CT abdomen negative. No evidence of hemolysis. No schistocytes P:  F/u CBC Transfuse for Hb < 7  INFECTIOUS A:   Sepsis - sputum culture positive for S. Agalactiae >> completed Abx 8/30. Restarted on zosyn P:   Follow cultures  ENDOCRINE A:   DKA - resolved. DM type I. P:   SSI Levimir. Increase to 36 units  NEUROLOGIC A:   Large right MCA CVA with edema and 43mm MLS. S/p decompressive craniectomy. P:   RASS goal 0 to -1 Neurosurgery s/o 04/02/17 >> recommend d/cing sutures 10 to 14 days after surgery, and f/u with neurosurgery in 3 months to assess for cranioplasty Continue ASA   Family: Called aunt, recommended trach placement per my conversation with neurology.  She is calling patient's sister and will call us back.  Requesting moving to [facility] [place] for long term care when ready.  The patient is critically ill with multiple organ systems failure and requires high complexity decision making for assessment and support, frequent evaluation and titration of therapies, application of advanced monitoring technologies and extensive interpretation of multiple  databases.   Critical Care Time devoted to patient care services described in this note is  35  Minutes. This time reflects time of care of this signee Dr Jennet Maduro. This critical care time does not reflect procedure time, or teaching time or supervisory time of PA/NP/Med student/Med Resident etc but could involve care discussion time.  Rush Farmer, M.D. [facility]  Pulmonary/Critical Care Medicine. Pager: [phone]. After hours pager: [phone].  04/11/2017, 10:10 AM

## 2017-04-11 NOTE — Progress Notes (Signed)
Nutrition Follow-up  INTERVENTION:   D/C  Vital AF 1.2  Glucerna 1.2 @ 65 ml/hr (1560 ml/day) 30 ml Prostat BID Provides: 2072 kcal, 123 grams protein, and 1255 ml free water.    NUTRITION DIAGNOSIS:   Inadequate oral intake related to inability to eat as evidenced by NPO status. Ongoing.   GOAL:   Patient will meet greater than or equal to 90% of their needs Met.   MONITOR:   Vent status, Labs, Weight trends, I & O's, TF tolerance  ASSESSMENT:   Pt admitted to [facility] with AMS from DKA and sepsis, found to have R MCA CVA with 12 mm midline shift and transferred to [facility], required R decompressive craniectomy.   Pt discussed during ICU rounds and with RN.  9/1 Cortrak placed  Patient is currently intubated on ventilator support MV: 10.4 L/min Temp (24hrs), Avg:99.3 F (37.4 C), Min:98.5 F (36.9 C), Max:100.2 F (37.9 C)  Medications reviewed and include: novolog 3 units every 4 hours, levemir 34 units daily Labs reviewed CBG's: 204-244-230  TF: Vital AF 1.2 @ 75 ml/hr   Diet Order:  Diet NPO time specified Diet NPO time specified  Skin:   (Surgical incision to R skull)  Last BM:  9/1  Height:   Ht Readings from Last 1 Encounters:  04/01/17 _0  (1.778 m)    Weight:   Wt Readings from Last 1 Encounters:  04/11/17 222 lb 10.6 oz (101 kg)  Admission weight 204 lb (92.7 kg) BMI 29.32  Ideal Body Weight:  75.45 kg  BMI:  Body mass index is 31.95 kg/m.  Estimated Nutritional Needs:   Kcal:  2136  Protein:  120-135kcals/day  Fluid:  Per MD  EDUCATION NEEDS:   No education needs identified at this time  [place], [place], [place] Pager [phone] After Hours Pager

## 2017-04-12 ENCOUNTER — Inpatient Hospital Stay (HOSPITAL_COMMUNITY): Payer: Medicaid Other

## 2017-04-12 DIAGNOSIS — G936 Cerebral edema: Secondary | ICD-10-CM

## 2017-04-12 DIAGNOSIS — J9600 Acute respiratory failure, unspecified whether with hypoxia or hypercapnia: Secondary | ICD-10-CM

## 2017-04-12 LAB — GLUCOSE, CAPILLARY
GLUCOSE-CAPILLARY: 123 mg/dL — AB (ref 65–99)
Glucose-Capillary: 101 mg/dL — ABNORMAL HIGH (ref 65–99)
Glucose-Capillary: 132 mg/dL — ABNORMAL HIGH (ref 65–99)
Glucose-Capillary: 152 mg/dL — ABNORMAL HIGH (ref 65–99)
Glucose-Capillary: 154 mg/dL — ABNORMAL HIGH (ref 65–99)
Glucose-Capillary: 159 mg/dL — ABNORMAL HIGH (ref 65–99)

## 2017-04-12 LAB — POCT I-STAT 3, ART BLOOD GAS (G3+)
Acid-Base Excess: 6 mmol/L — ABNORMAL HIGH (ref 0.0–2.0)
BICARBONATE: 29.7 mmol/L — AB (ref 20.0–28.0)
O2 Saturation: 96 %
PCO2 ART: 40.8 mmHg (ref 32.0–48.0)
PO2 ART: 81 mmHg — AB (ref 83.0–108.0)
Patient temperature: 99.1
TCO2: 31 mmol/L (ref 22–32)
pH, Arterial: 7.471 — ABNORMAL HIGH (ref 7.350–7.450)

## 2017-04-12 LAB — BASIC METABOLIC PANEL
ANION GAP: 7 (ref 5–15)
BUN: 15 mg/dL (ref 6–20)
CO2: 26 mmol/L (ref 22–32)
Calcium: 8.5 mg/dL — ABNORMAL LOW (ref 8.9–10.3)
Chloride: 106 mmol/L (ref 101–111)
Creatinine, Ser: 0.75 mg/dL (ref 0.61–1.24)
GFR calc non Af Amer: >60 mL/min (ref >60)
GLUCOSE: 147 mg/dL — AB (ref 65–99)
POTASSIUM: 3.8 mmol/L (ref 3.5–5.1)
Sodium: 139 mmol/L (ref 135–145)

## 2017-04-12 LAB — CBC
HCT: 24.2 % — ABNORMAL LOW (ref 39.0–52.0)
Hemoglobin: 7.1 g/dL — ABNORMAL LOW (ref 13.0–17.0)
MCH: 28.9 pg (ref 26.0–34.0)
MCHC: 29.3 g/dL — AB (ref 30.0–36.0)
MCV: 98.4 fL (ref 78.0–100.0)
PLATELETS: 251 10*3/uL (ref 150–400)
RBC: 2.46 MIL/uL — ABNORMAL LOW (ref 4.22–5.81)
RDW: 20.2 % — AB (ref 11.5–15.5)
WBC: 15.1 10*3/uL — ABNORMAL HIGH (ref 4.0–10.5)

## 2017-04-12 LAB — MAGNESIUM: Magnesium: 2.2 mg/dL (ref 1.7–2.4)

## 2017-04-12 LAB — PROTIME-INR
INR: 1.08
PROTHROMBIN TIME: 13.9 s (ref 11.4–15.2)

## 2017-04-12 LAB — SODIUM: SODIUM: 138 mmol/L (ref 135–145)

## 2017-04-12 LAB — PHOSPHORUS: PHOSPHORUS: 4.4 mg/dL (ref 2.5–4.6)

## 2017-04-12 NOTE — Procedures (Signed)
Bronchoscopy Procedure Note Joe Wilkinson [id number] 04-Oct-1960  Procedure: Bronchoscopy Indications: Diagnostic evaluation of the airways  Procedure Details Consent: Risks of procedure as well as the alternatives and risks of each were explained to the (patient/caregiver).  Consent for procedure obtained. Time Out: Verified patient identification, verified procedure, site/side was marked, verified correct patient position, special equipment/implants available, medications/allergies/relevent history reviewed, required imaging and test results available.  Performed  In preparation for procedure, patient was given 100% FiO2 and bronchoscope lubricated. Sedation: Benzodiazepines, Muscle relaxants, Etomidate and Fentanyl  Airway entered and the following bronchi were examined: RUL, RML, RLL, LUL, LLL and Bronchi.   Procedures performed: Brushings performed Bronchoscope removed.  , Patient placed back on 100% FiO2 at conclusion of procedure.    Evaluation Hemodynamic Status: BP stable throughout; O2 sats: stable throughout Patient's Current Condition: stable Specimens:  None Complications: No apparent complications Patient did tolerate procedure well.  Done as a part of tracheostomy placement  Joe Wilkinson 04/12/2017

## 2017-04-12 NOTE — Procedures (Signed)
Percutaneous Tracheostomy Placement  Consent from family.  Patient sedated, paralyzed and position.  Placed on 100% FiO2 and RR matched.  Area cleaned and draped.  Lidocaine/epi injected.  Skin incision done followed by blunt dissection.  Trachea palpated then punctured, catheter passed and visualized bronchoscopically.  Wire placed and visualized.  Catheter removed.  Airway then entered and dilated.  Size 8 cuffed shiley trach placed and visualized bronchoscopically well above carina.  Good volume returns.  Patient tolerated the procedure well without complications.  Minimal blood loss.  CXR ordered and pending.  [name] G. [name], M.D. [place] Pulmonary/Critical Care Medicine. Pager: [phone]. After hours pager: [phone]. 

## 2017-04-12 NOTE — Progress Notes (Signed)
Wound check Craniotomy site: Staples present. No redness, drainage, warmth. Stable swelling. No sign of infection. No new recs. Continue with plan for removal of staples 2-4 days

## 2017-04-12 NOTE — Consult Note (Signed)
[facility] Consult/Admission Note  Joe Wilkinson 12/16/1960  [id number].    Requesting MD: Dr. [name] Chief Complaint/Reason for Consult: PEG  HPI:  Joe Wilkinson is a 55 y.o. male who was brought to [facility] ER 5/23 after being [place] initially lethargic [place] bus [place] and later unresponsive. He was found to be in DKA (glucose >1100) with severe acidosis. Intubated due to dyspnea likely secondary to severe acidosis. Was also found to have AKI and leukocytosis. Note from 08/24 states "Pt awake and alert. Pt not moving left side of body. Stat CT head ordered revealing large right MCA infarct with diffuse edema and mass effect with 12mm midline shift. Transferred from [facility].  Required Rt decompressive craniectomy and remained on vent post op. Pt underwent tracheotomy 09/05. We are being consulted for PEG placement. I spoke with the sister today and she stated that he had one abdominal surgery in the past and she thinks it was either a cholecystectomy or a hernia repair, unsure of when. She also states pt was being evaluated for possible colon or rectal cancer. Pt has a history of DM and HTN.   Pt would open his eyes but nonverbal.   ROS:  Review of Systems  Unable to perform ROS: Patient nonverbal     No family history on file.  No past medical history on file.  Past Surgical History:  Procedure Laterality Date  . CRANIECTOMY Right 04/01/2017   Procedure: RIGHT DECOMPRESSIVE CRANIECTOMY;  Surgeon: Ditty, Benjamin Jared, MD;  Location: [facility] OR;  Service: Neurosurgery;  Laterality: Right;    Social History:  has no tobacco, alcohol, and drug history on file.  Allergies: No Known Allergies  Medications Prior to Admission  Medication Sig Dispense Refill  . insulin aspart (NOVOLOG) 100 UNIT/ML injection Inject 10 Units into the skin 3 (three) times daily before meals.    . insulin detemir (LEVEMIR) 100 UNIT/ML injection Inject 40 Units into the skin at bedtime.    . lisinopril  (PRINIVIL,ZESTRIL) 10 MG tablet Take 10 mg by mouth daily.    . metFORMIN (GLUCOPHAGE-XR) 500 MG 24 hr tablet Take 500 mg by mouth daily with breakfast.      Blood pressure 132/81, pulse 80, temperature 99.3 F (37.4 C), temperature source Axillary, resp. rate 16, height 5' 10" (1.778 m), weight 220 lb 10.9 oz (100.1 kg), SpO2 100 %.  Physical Exam  HENT:  Right Ear: External ear normal.  Left Ear: External ear normal.  Nose: Nose normal.  S/p craniotomy incision with staples appears well healing without signs of infection. Swelling to left side of head.   Eyes: Pupils are equal, round, and reactive to light. Conjunctivae are normal. Right eye exhibits no discharge. Left eye exhibits no discharge. No scleral icterus.  Unable to assess EOM  Neck:  Trach intact, unable to assess thyroid   Cardiovascular: Normal rate and regular rhythm.  Exam reveals no gallop and no friction rub.   No murmur heard. Pulses:      Radial pulses are 0 on the right side, and 2+ on the left side.       Dorsalis pedis pulses are 2+ on the right side, and 2+ on the left side.  Doppler revealed R radial pulse  Pulmonary/Chest:  Trach in place and on vent, lungs CTA, no wheezes, rhonchi, or rales noted  Abdominal: Soft. Bowel sounds are normal. He exhibits distension (mild). No hernia.  Small well healed incisional scar noted at the umbilicus and 2 possible   port sites in RUQ  Musculoskeletal: He exhibits no edema or deformity.  Neurological:  Pt moved right hand and leg spontaneously, did open eyes briefly, sedated and on vent   Skin: Skin is warm and dry.  Nursing note and vitals reviewed.   Results for orders placed or performed during the hospital encounter of 03/31/17 (from the past 48 hour(s))  Glucose, capillary     Status: Abnormal   Collection Time: 04/10/17  4:43 PM  Result Value Ref Range   Glucose-Capillary 154 (H) 65 - 99 mg/dL  Sodium     Status: None   Collection Time: 04/10/17  8:02 PM   Result Value Ref Range   Sodium 142 135 - 145 mmol/L  Glucose, capillary     Status: Abnormal   Collection Time: 04/10/17  8:05 PM  Result Value Ref Range   Glucose-Capillary 190 (H) 65 - 99 mg/dL  Glucose, capillary     Status: Abnormal   Collection Time: 04/10/17 11:30 PM  Result Value Ref Range   Glucose-Capillary 186 (H) 65 - 99 mg/dL  CBC     Status: Abnormal   Collection Time: 04/11/17  2:19 AM  Result Value Ref Range   WBC 16.8 (H) 4.0 - 10.5 K/uL   RBC 2.64 (L) 4.22 - 5.81 MIL/uL   Hemoglobin 7.7 (L) 13.0 - 17.0 g/dL   HCT 25.9 (L) 39.0 - 52.0 %   MCV 98.1 78.0 - 100.0 fL   MCH 29.2 26.0 - 34.0 pg   MCHC 29.7 (L) 30.0 - 36.0 g/dL   RDW 20.4 (H) 11.5 - 15.5 %   Platelets 251 150 - 400 K/uL  Basic metabolic panel     Status: Abnormal   Collection Time: 04/11/17  2:19 AM  Result Value Ref Range   Sodium 143 135 - 145 mmol/L   Potassium 3.9 3.5 - 5.1 mmol/L   Chloride 111 101 - 111 mmol/L   CO2 27 22 - 32 mmol/L   Glucose, Bld 209 (H) 65 - 99 mg/dL   BUN 12 6 - 20 mg/dL   Creatinine, Ser 0.82 0.61 - 1.24 mg/dL   Calcium 8.7 (L) 8.9 - 10.3 mg/dL   GFR calc non Af Amer >60 >60 mL/min   GFR calc Af Amer >60 >60 mL/min    Comment: (NOTE) The eGFR has been calculated using the CKD EPI equation. This calculation has not been validated in all clinical situations. eGFR's persistently <60 mL/min signify possible Chronic Kidney Disease.    Anion gap 5 5 - 15  Magnesium     Status: None   Collection Time: 04/11/17  2:19 AM  Result Value Ref Range   Magnesium 2.1 1.7 - 2.4 mg/dL  Phosphorus     Status: None   Collection Time: 04/11/17  2:19 AM  Result Value Ref Range   Phosphorus 3.6 2.5 - 4.6 mg/dL  Glucose, capillary     Status: Abnormal   Collection Time: 04/11/17  3:40 AM  Result Value Ref Range   Glucose-Capillary 204 (H) 65 - 99 mg/dL  Sodium     Status: None   Collection Time: 04/11/17  8:34 AM  Result Value Ref Range   Sodium 141 135 - 145 mmol/L  Glucose,  capillary     Status: Abnormal   Collection Time: 04/11/17  8:55 AM  Result Value Ref Range   Glucose-Capillary 244 (H) 65 - 99 mg/dL   Comment 1 Notify RN    Comment 2 Document in   Chart   Glucose, capillary     Status: Abnormal   Collection Time: 04/11/17 11:50 AM  Result Value Ref Range   Glucose-Capillary 230 (H) 65 - 99 mg/dL   Comment 1 Notify RN    Comment 2 Document in Chart   Sodium     Status: None   Collection Time: 04/11/17  2:09 PM  Result Value Ref Range   Sodium 142 135 - 145 mmol/L  Glucose, capillary     Status: Abnormal   Collection Time: 04/11/17  4:00 PM  Result Value Ref Range   Glucose-Capillary 174 (H) 65 - 99 mg/dL   Comment 1 Notify RN    Comment 2 Document in Chart   Glucose, capillary     Status: Abnormal   Collection Time: 04/11/17  7:52 PM  Result Value Ref Range   Glucose-Capillary 149 (H) 65 - 99 mg/dL  Sodium     Status: None   Collection Time: 04/11/17  8:15 PM  Result Value Ref Range   Sodium 139 135 - 145 mmol/L  Glucose, capillary     Status: Abnormal   Collection Time: 04/11/17 11:35 PM  Result Value Ref Range   Glucose-Capillary 177 (H) 65 - 99 mg/dL  CBC     Status: Abnormal   Collection Time: 04/12/17  1:51 AM  Result Value Ref Range   WBC 15.1 (H) 4.0 - 10.5 K/uL   RBC 2.46 (L) 4.22 - 5.81 MIL/uL   Hemoglobin 7.1 (L) 13.0 - 17.0 g/dL   HCT 24.2 (L) 39.0 - 52.0 %   MCV 98.4 78.0 - 100.0 fL   MCH 28.9 26.0 - 34.0 pg   MCHC 29.3 (L) 30.0 - 36.0 g/dL   RDW 20.2 (H) 11.5 - 15.5 %   Platelets 251 150 - 400 K/uL  Basic metabolic panel     Status: Abnormal   Collection Time: 04/12/17  1:51 AM  Result Value Ref Range   Sodium 139 135 - 145 mmol/L   Potassium 3.8 3.5 - 5.1 mmol/L   Chloride 106 101 - 111 mmol/L   CO2 26 22 - 32 mmol/L   Glucose, Bld 147 (H) 65 - 99 mg/dL   BUN 15 6 - 20 mg/dL   Creatinine, Ser 0.75 0.61 - 1.24 mg/dL   Calcium 8.5 (L) 8.9 - 10.3 mg/dL   GFR calc non Af Amer >60 >60 mL/min   GFR calc Af Amer >60  >60 mL/min    Comment: (NOTE) The eGFR has been calculated using the CKD EPI equation. This calculation has not been validated in all clinical situations. eGFR's persistently <60 mL/min signify possible Chronic Kidney Disease.    Anion gap 7 5 - 15  Magnesium     Status: None   Collection Time: 04/12/17  1:51 AM  Result Value Ref Range   Magnesium 2.2 1.7 - 2.4 mg/dL  Phosphorus     Status: None   Collection Time: 04/12/17  1:51 AM  Result Value Ref Range   Phosphorus 4.4 2.5 - 4.6 mg/dL  I-STAT 3, arterial blood gas (G3+)     Status: Abnormal   Collection Time: 04/12/17  3:42 AM  Result Value Ref Range   pH, Arterial 7.471 (H) 7.350 - 7.450   pCO2 arterial 40.8 32.0 - 48.0 mmHg   pO2, Arterial 81.0 (L) 83.0 - 108.0 mmHg   Bicarbonate 29.7 (H) 20.0 - 28.0 mmol/L   TCO2 31 22 - 32 mmol/L   O2 Saturation 96.0 %     Acid-Base Excess 6.0 (H) 0.0 - 2.0 mmol/L   Patient temperature 99.1 F    Collection site RADIAL, ALLEN'S TEST ACCEPTABLE    Sample type ARTERIAL   Glucose, capillary     Status: Abnormal   Collection Time: 04/12/17  3:53 AM  Result Value Ref Range   Glucose-Capillary 101 (H) 65 - 99 mg/dL  Sodium     Status: None   Collection Time: 04/12/17  8:33 AM  Result Value Ref Range   Sodium 138 135 - 145 mmol/L  Glucose, capillary     Status: Abnormal   Collection Time: 04/12/17  9:17 AM  Result Value Ref Range   Glucose-Capillary 132 (H) 65 - 99 mg/dL   Comment 1 Notify RN    Comment 2 Document in Chart   Glucose, capillary     Status: Abnormal   Collection Time: 04/12/17 12:18 PM  Result Value Ref Range   Glucose-Capillary 123 (H) 65 - 99 mg/dL   Comment 1 Notify RN    Comment 2 Document in Chart   Protime-INR     Status: None   Collection Time: 04/12/17 12:33 PM  Result Value Ref Range   Prothrombin Time 13.9 11.4 - 15.2 seconds   INR 1.08    Dg Chest Port 1 View  Result Date: 04/12/2017 CLINICAL DATA:  56-year-old male status post large right MCA infarct.  EXAM: PORTABLE CHEST 1 VIEW COMPARISON:  04/11/2017 and earlier. FINDINGS: Portable AP semi upright view at at 0544 hours. Endotracheal tube tip at the level the clavicles. Feeding tube courses to the abdomen, tip not included. Stable lung volumes. Normal cardiac size and mediastinal contours. Streaky left lung base opacity and small left pleural effusion have not significantly changed. No other confluent opacity. No areas of worsening ventilation. IMPRESSION: 1.  Stable lines and tubes. 2. Stable small left pleural effusion and left lung atelectasis. Electronically Signed   By: H  Hall M.D.   On: 04/12/2017 07:46   Dg Chest Port 1 View  Result Date: 04/11/2017 CLINICAL DATA:  56-year-old male with acute respiratory failure and stroke. Subsequent encounter. EXAM: PORTABLE CHEST 1 VIEW COMPARISON:  04/10/2017. FINDINGS: Endotracheal tube tip 2.7 cm above the carina. Nasogastric tube courses below the diaphragm. Tip is not included on the present exam. Left central line less dense than on prior exam and tip not adequately assessed on current examination. Interval decrease in degree of pulmonary vascular congestion. Central pulmonary vascular prominence remains. Blunting left costophrenic angle may represent tiny amount of left-sided pleural fluid. Subsegmental atelectasis left mid lung. No segmental consolidation. IMPRESSION: Interval decrease in degree of pulmonary vascular congestion. Tiny left-sided pleural effusion suspected and without significant change. Left central line appears less dense than on prior exam and tip not adequately assessed on the current examination. Electronically Signed   By: Steven  Olson M.D.   On: 04/11/2017 07:30      Assessment/Plan  DM HTN CVA S/P craniectomy  PEG placememt - will proceed with PEG placement at bedside tomorrow, 09/06, at 1130  Thank you for the consult   L , PA-C [facility] 04/12/2017, 3:43 PM Pager: [phone] Consults:  [phone] Mon-Fri 7:00 am-4:30 pm Sat-Sun 7:00 am-11:30 am  

## 2017-04-12 NOTE — Progress Notes (Signed)
STROKE TEAM PROGRESS NOTE   SUBJECTIVE (INTERVAL HISTORY)  . No neuro changes. Hb remains stable at 24.2    Repeat CT scan of the head done 04/10/17 shows stable appearance of the cytotoxic edema without any increase. serum sodium this morning is 138 and he is now offn hypertonic saline but it has been 11 days since his stroke OBJECTIVE Temp:  [97.9 F (36.6 C)-99.5 F (37.5 C)] 99.3 F (37.4 C) (09/05 1200) Pulse Rate:  [65-119] 80 (09/05 1500) Cardiac Rhythm: Normal sinus rhythm (09/05 1200) Resp:  [15-26] 16 (09/05 1500) BP: (105-142)/(67-93) 132/81 (09/05 1500) SpO2:  [97 %-100 %] 100 % (09/05 1500) FiO2 (%):  [30 %-100 %] 100 % (09/05 1300) Weight:  [220 lb 10.9 oz (100.1 kg)] 220 lb 10.9 oz (100.1 kg) (09/05 0500) Examination:  . Afebrile. Head is nontraumatic. Neck is supple without bruit.    Cardiac exam no murmur or gallop. Lungs are clear to auscultation. Distal pulses are well felt.   Lungs : CTA Heart: RRR Abdomen: soft NT Neurological Exam :  Still intubated, drowsy and barely eyes open on voice. Follows simple commands on the right UE and LE. Right gaze preference, not able to cross midline to the left. Pupils 3 mm reactive to light. Fundi could not be visualized. Corneal, gag and cough are present. Patient is able to move right upper and lower extremity spontaneously and against gravity with brisk withdrawal response to pain. Dense left hemiplegia with decreased tone. Reflexes present on the right and absent on the left. Left plantar upgoing right equivocal.   CBC:   Recent Labs Lab 04/11/17 0219 04/12/17 0151  WBC 16.8* 15.1*  HGB 7.7* 7.1*  HCT 25.9* 24.2*  MCV 98.1 98.4  PLT 251 403    Basic Metabolic Panel:   Recent Labs Lab 04/11/17 0219  04/12/17 0151 04/12/17 0833  NA 143  < > 139 138  K 3.9  --  3.8  --   CL 111  --  106  --   CO2 27  --  26  --   GLUCOSE 209*  --  147*  --   BUN 12  --  15  --   CREATININE 0.82  --  0.75  --   CALCIUM 8.7*   --  8.5*  --   MG 2.1  --  2.2  --   PHOS 3.6  --  4.4  --   < > = values in this interval not displayed.  Lipid Panel:     Component Value Date/Time   CHOL 93 04/02/2017 0607   TRIG 135 04/02/2017 0607   HDL 28 (L) 04/02/2017 0607   CHOLHDL 3.3 04/02/2017 0607   VLDL 27 04/02/2017 0607   LDLCALC 38 04/02/2017 0607   HgbA1c:  Lab Results  Component Value Date   HGBA1C 14.3 (H) 04/02/2017   Urine Drug Screen: No results found for: LABOPIA, COCAINSCRNUR, LABBENZ, AMPHETMU, THCU, LABBARB  Alcohol Level No results found for: ETH  IMAGING   Ct Head Wo Contrast 03/31/2017 1. Progressive large right MCA territory infarct with involvement of the right occipital lobe likely related to a fetal type right posterior cerebral artery.  2. Diffuse edema and mass effect with 12 mm midline shift and partial effacement of the para mesencephalic cisterns.  3. Developing left posterior watershed territory infarct.  4. Nasal fluid and bilateral sinus disease likely related to intubation.   Ct Head Wo Contrast 03/30/2017 No acute intracranial abnormality identified.  Unremarkable CT of the brain.   Ct Angio Head and neck W Or Wo Contrast 04/02/2017 IMPRESSION:  1. Occlusion of the right middle cerebral artery proximal M1 segment with extensive cytotoxic edema throughout the entire right MCA distribution.  2. Status post right pterional craniectomy for decompression with unchanged leftward midline shift measuring 8 mm.  3. No hemorrhage.  4. Normal carotid arteries.  No embolic source identified.   LE venous doppler - No evidence of deep vein thrombosis or baker's cysts bilaterally.  EEG - This EEG is abnormal due to bilateral slowing with suppression of the right hemisphere. Clinical Correlation of the above findings indicates bilateral cerebral dysfunction that is non-specific in etiology and can be seen with hypoxic/ischemic injury, toxic/metabolic encephalopathies, neurodegenerative  disorders, or medication effect.  The absence of epileptiform discharges does not rule out a clinical diagnosis of epilepsy.  Clinical correlation is advised.  TTE Left ventricle: The cavity size was normal. There was severe   concentric hypertrophy. Systolic function was vigorous. The   estimated ejection fraction was in the range of 65% to 70%. Wall   motion was normal; there were no regional wall motion   abnormalities. Left ventricular diastolic function parameters   were normal. - Atrial septum: There was increased thickness of the septum,   consistent with lipomatous hypertrophy. - Pulmonary arteries: Systolic pressure could not be accurately   estimated.  Ct Head Wo Contrast 04/05/2017 IMPRESSION:  1. Evolving acute extensive RIGHT cerebrum infarcts including RIGHT MCA, to lesser extent RIGHT ACA and RIGHT PCA territories. Petechial hemorrhage without hemorrhagic conversion.  2. Similar 7 mm RIGHT to LEFT midline shift without ventricular entrapment. Similar external herniation via RIGHT craniectomy defect.  3. Similar acute small LEFT parietal lobe infarct.   Ct Abdomen Pelvis Wo Contrast 04/06/2017 IMPRESSION:  No evidence of retroperitoneal hematoma. Diffuse mesenteric and retroperitoneal soft tissue stranding and body wall edema, consistent with third spacing. No evidence of focal inflammatory process or abscess. Tiny left pleural effusion and bibasilar atelectasis noted. Indeterminate 3.5 cm low-attenuation lesion in left hepatic lobe. Consider further characterization with hepatic protocol abdomen CT with contrast (or MRI if patient can cooperate with breath-holding).    PHYSICAL EXAM   Vitals:   04/12/17 1200 04/12/17 1300 04/12/17 1400 04/12/17 1500  BP: 119/76 126/78 119/75 132/81  Pulse: 81 75 73 80  Resp: 17 16 18 16   Temp: 99.3 F (37.4 C)     TempSrc: Axillary     SpO2: 100% 100% 100% 100%  Weight:      Height:       44 African-American male who is  sedated and intubated. Craniotomy bandage on the right skull. Afebrile. Head is nontraumatic. Cardiac exam no murmur or gallop. Distal pulses are well felt. Neurological Exam :  Still intubated, eyes open on voice. Follows simple commands on the right UE and LE. Right gaze preference, not able to cross midline to the left. Pupils 3 mm reactive to light. Fundi could not be visualized. Corneal, gag and cough are present. Patient is able to move right upper and lower extremity spontaneously and against gravity with brisk withdrawal response to pain. Dense left hemiplegia with decreased tone. Reflexes present on the right and absent on the left. Left plantar upgoing right equivocal. Sensation, coordination and gait not tested.   ASSESSMENT/PLAN Joe Wilkinson is a 57 y.o. male with history of diabetes mellitus (other past medical history unknown at this time) presenting with unresponsiveness. He did not receive  IV t-PA due to decompressive right frontotemporoparietal craniectomy for decompression.  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 S/p right decompressive hemicraniectomy.   Resultant  left hemiplegia and right gaze   CT head - Progressive large right MCA territory infarct with involvement of the right occipital lobe likely related to a fetal type right PCA. Diffuse edema and mass effect. Developing left posterior watershed territory infarct.   CTA head and neck - right M1 cut off, b/l fetal PCAs with dimunitive BA and VAs  MRI head - consider once stabilized  2D Echo - EF 65-70%  EEG no seizure  LDL - 38   HgbA1c - 14.3  VTE prophylaxis - SCDs (not on lovenox due to critical anemia needed repeated PRBC) Diet NPO time specified  No antithrombotic prior to admission, now on ASA 325mg   Patient will be counseled to be compliant with his antithrombotic medications  Ongoing aggressive stroke risk factor  management  Therapy recommendations:  pending  Disposition:  Pending  Cerebral edema  CT head large right MCA infarct with diffuse edema and midline shift  Status post decompressive hemicraniectomy  Neurosurgery on board  Repeat CT evolving right malignant MCA, stable   Sodium 158->155->158->154->150    on 3% saline plan to taper and stop over next 2 days  Sodium goal of 150-160, continue monitoring Q6h  PAF - could be potential source of current stroke  Confirmed with EKG  HR can go up to 140-150s  Reversed by amiodarone  Now off amiodarone  CCM on board  Acute blood loss anemia - no source found so far  Source unclear, could be due to surgery  Hb 12.6 - 10.9 - 8.2 - 7.0 - 6.2 - PRBC - 7.5 -8.1-> 6.6->PRBC->7.2->6.7-> PRBC->7.8->7.0->6.6 - PRBC  S/p repeated PRBC transfusion  CT abdomen - no retroperitoneal hematoma  No bloody or dark bowel movement  Stool occult blood - pending ( ordered 04/06/2017 - spoke with nursing today to reinforce need)  CBC monitoring - H/H 7.8/25.8 Thursday - recheck in AM  Diabetes with DKA  HgbA1c 14.3, goal < 7.0  Uncontrolled  Off insulin drip, on Levemir  Still significant hyperglycemia  DM nurse on board, appreciate help  CCM on board  Leukocytosis,? Sepsis  WBC 18.8 -> 26.2->25.5->24.0->20.1->17.3  Off Abx  off Neo now  CCM on board  Hypertension  Stable off Neo for hypotension BP goal 120-160 Long-term BP goal normotensive  Other Stroke Risk Factors  Obesity, Body mass index is 31.66 kg/m., recommend weight loss, diet and exercise as appropriate   Other Active Problems  Induced hypernatremia  Thrombocytopenia - resolved  TF @ 75  Cortrak Tube - placed   04/08/2017  Hospital day # 11 Plan the patient  will get  Tracheostomy today and   PEG tube later this week. No family available at bedside for discussion.D/w Dr Nelda Marseille This patient is critically ill due to right malignant MCA infarct  status post hemicraniectomy, embolic infarcts to left MCA, PAF, diabetes and DKA, sepsis, leukocytosis, question procedure and at significant risk of neurological worsening, death form cerebral edema, ankle herniation, heart failure, status epilepticus, septic shock, DKA, hemorrhagic transformation. This patient's care requires constant monitoring of vital signs, hemodynamics, respiratory and cardiac monitoring, review of multiple databases, neurological assessment, discussion with family, other specialists and medical decision making of high complexity. I spent 30 minutes of neurocritical care time in the care of this patient.  I have personally examined this patient, reviewed notes, independently viewed imaging studies, participated in medical decision making and plan of care.ROS completed by me personally and pertinent positives fully documented  I have made any additions or clarifications directly to the above note.    Antony Contras, MD Medical Director [facility] Pager: [phone] 04/12/2017 3:58 PM    To contact Stroke Continuity provider, please refer to [email or web address] After hours, contact General Neurology

## 2017-04-12 NOTE — Progress Notes (Signed)
PULMONARY / CRITICAL CARE MEDICINE   Name: Joe Wilkinson MRN: [id number] DOB: Jan 16, 1961    ADMISSION DATE:  03/31/2017 CHIEF COMPLAINT:  CVA  HISTORY OF PRESENT ILLNESS:   56 yo male brought to [facility] with altered mental status from DKA and sepsis.  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  Required Rt decompressive craniectomy and remained on vent post op.  SUBJECTIVE:  No change overnight.  For trach today.   VITAL SIGNS: BP 132/76   Pulse 81   Temp 98.3 F (36.8 C) (Oral)   Resp 16   Ht 5\' 10"  (1.778 m)   Wt 100.1 kg (220 lb 10.9 oz)   SpO2 97%   BMI 31.66 kg/m   VENTILATOR SETTINGS: Vent Mode: PRVC FiO2 (%):  [30 %] 30 % Set Rate:  [16 bmp] 16 bmp Vt Set:  [550 mL] 550 mL PEEP:  [5 cmH20] 5 cmH20 Pressure Support:  [8 cmH20] 8 cmH20 Plateau Pressure:  [15 cmH20-16 cmH20] 16 cmH20  INTAKE / OUTPUT: I/O last 3 completed shifts: In: 2880.4 [I.V.:361.6; NG/GT:2318.8; IV Piggyback:200] Out: 4275 [Urine:4275]  PHYSICAL EXAMINATION: Gen:      NAD on vent  HEENT:  [place]/AT, PERRL, EOM-I and MMM Neck:      Supple, no masses, ETT Lungs:    resps even non labored on vent, copious oral secretions, CTA bilaterally CV:         RRR, Nl S1/S2, -M/R/G. Abd:      Soft, NT, ND and +BS Ext:    -edema and -tenderness Skin:       Warm, intact Neuro:   Awake, moving R arm, tracks, does not follow commands   BMET  Recent Labs Lab 04/10/17 0822  04/11/17 0219  04/11/17 1409 04/11/17 2015 04/12/17 0151  NA 149*  < > 143  < > 142 139 139  K 4.1  --  3.9  --   --   --  3.8  CL 115*  --  111  --   --   --  106  CO2 25  --  27  --   --   --  26  BUN 13  --  12  --   --   --  15  CREATININE 0.81  --  0.82  --   --   --  0.75  GLUCOSE 205*  --  209*  --   --   --  147*  < > = values in this interval not displayed.  Electrolytes  Recent Labs Lab 04/09/17 0407 04/10/17 0822 04/11/17 0219 04/12/17 0151  CALCIUM 8.3* 8.5* 8.7* 8.5*  MG 2.4  --  2.1 2.2  PHOS 3.7  --   3.6 4.4    CBC  Recent Labs Lab 04/10/17 0822 04/11/17 0219 04/12/17 0151  WBC 15.0* 16.8* 15.1*  HGB 7.1* 7.7* 7.1*  HCT 24.6* 25.9* 24.2*  PLT 213 251 251    Coag's  Recent Labs Lab 04/09/17 1100  APTT 32  INR 1.15    Sepsis Markers No results for input(s): LATICACIDVEN, PROCALCITON, O2SATVEN in the last 168 hours.  ABG  Recent Labs Lab 04/08/17 0417 04/12/17 0342  PHART 7.407 7.471*  PCO2ART 44.9 40.8  PO2ART 88.8 81.0*    Liver Enzymes  Recent Labs Lab 04/07/17 1009  AST 22  ALT 15*  ALKPHOS 51  BILITOT 0.6  ALBUMIN 1.3*    Cardiac Enzymes No results for input(s): TROPONINI, PROBNP in the last 168  hours.  Glucose  Recent Labs Lab 04/11/17 1150 04/11/17 1600 04/11/17 1952 04/11/17 2335 04/12/17 0353 04/12/17 0917  GLUCAP 230* 174* 149* 177* 101* 132*    Imaging Dg Chest Port 1 View  Result Date: 04/12/2017 CLINICAL DATA:  56 year old male status post large right MCA infarct. EXAM: PORTABLE CHEST 1 VIEW COMPARISON:  04/11/2017 and earlier. FINDINGS: Portable AP semi upright view at at 0544 hours. Endotracheal tube tip at the level the clavicles. Feeding tube courses to the abdomen, tip not included. Stable lung volumes. Normal cardiac size and mediastinal contours. Streaky left lung base opacity and small left pleural effusion have not significantly changed. No other confluent opacity. No areas of worsening ventilation. IMPRESSION: 1.  Stable lines and tubes. 2. Stable small left pleural effusion and left lung atelectasis. Electronically Signed   By: Genevie Ann M.D.   On: 04/12/2017 07:46    STUDIES:  LP 8/24 >> glucose 205, RBC 16, protein 62, WBC 5 CT head 8/24 >> Large Rt MCA EEG 8/27 >> bilateral cerebral dysfunction, non-specific in etiology Echo 8/27 >> EF 65 to 70% CT abd/pelvis 8/30 >> third spacing, 3.5 cm low attenuation Lt hepatic lobe  CULTURES: CSF 8/24>> negative BCX 8/24>> negative UCX 8/24>> negative Sputum 8/24>> S.  agalactiae  ANTIBIOTICS: vanco 8/24 >> 8/26 Zosyn 8/24 >> 8/26, 9/1 >> Ampicillin 8/27 > 8/30  SIGNIFICANT EVENTS: 8/23 Admit [facility] 8/23 8/24 Transfer to [facility] after Rt MCA CVA, start 3% NS 8/25 Decompressive craniectomy 8/27 Transfuse 1 unit PRBC 8/28 Transfuse 1 unit PRBC, A fib with RVR 8/30 Transfuse 1 unit PRBC 8/31 Off pressors, reintubated x 2, transfuse PRBC  LINES/TUBES: ETT 8/23 >> Lt IJ CVL 8/23 >>  ASSESSMENT / PLAN:  PULMONARY A: Compromised airway. Aspiration P:   Vent support - 8cc/kg  F/u CXR  F/u ABG For trach today  ATC trials post trach    CARDIOVASCULAR A:  Septic shock >> resolved. A fib with RVR >> back in sinus rhythm. P:  PRN lopressor Off amio Tele monitoring  RENAL A:   Medically induced hypernatremia - resolved.  P:  Off 3% saline  F/u chem  Replace electrolytes as indicated   GASTROINTESTINAL A:   Nutrition. Dysphagia. Low Hb but evidence of active bleed P:   TF on hold for trach  Resume post trach  Tube feeds per nutrition Will need PEG PPI for ulcer prophylaxis. Increase to q12 as he may have stress gastritis  HEMATOLOGIC A:   Persistent anemia >> no evidence for GI bleeding, and CT abdomen negative. No evidence of hemolysis. No schistocytes P:  F/u CBC Transfuse for Hb < 7  INFECTIOUS A:   Sepsis - sputum culture positive for S. Agalactiae >> completed Abx 8/30. Restarted on zosyn P:   Continue zosyn  Follow cultures   ENDOCRINE A:   DKA - resolved. DM type I. 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 S/p decompressive craniectomy. P:   RASS goal 0 to -1 Neurosurgery s/o 04/02/17 >> recommend d/cing sutures 10 to 14 days after surgery, and f/u with neurosurgery in 3 months to assess for cranioplasty Continue ASA   Family: no family available 9/5. Discussed via phone 9/6.    Requesting moving to [place] for long term care when ready.   Nickolas Madrid,  NP 04/12/2017  9:46 AM Pager: [phone] or [phone]  Attending Note:  56 year old s/p  large right MCA CVA with associated edema, post op who remains unresponsive.  Family wishes for full code status.  On exam, lungs are clear but remains unresponsive.  I reviewed CXR myself, ETT in good position.  Neurology recommending trach placement.  Will proceed with trach today.  Will need PEG placement and long term placement.  Proceed with aggressive weaning post trach.  PCCM will continue to follow.  The patient is critically ill with multiple organ systems failure and requires high complexity decision making for assessment and support, frequent evaluation and titration of therapies, application of advanced monitoring technologies and extensive interpretation of multiple databases.   Critical Care Time devoted to patient care services described in this note is  35  Minutes. This time reflects time of care of this signee Dr Jennet Maduro. This critical care time does not reflect procedure time, or teaching time or supervisory time of PA/NP/Med student/Med Resident etc but could involve care discussion time.  Rush Farmer, M.D. [facility] Pulmonary/Critical Care Medicine. Pager: [phone]. After hours pager: [phone].

## 2017-04-12 NOTE — Progress Notes (Signed)
[place] Progress Note Patient Name: Joe Wilkinson DOB: [birth year] MRN: [id number]   Date of Service  04/12/2017  HPI/Events of Note  Agitation - Called to renew R soft wrist restraint.   eICU Interventions  Will renew order for R soft wrist restraint.     Intervention Category Minor Interventions: Agitation / anxiety - evaluation and management  Lysle Dingwall 04/12/2017, 7:33 PM

## 2017-04-12 NOTE — Procedures (Signed)
Bedside Tracheostomy Insertion Procedure Note   Patient Details:   Name: Joe Wilkinson DOB: [birth year] MRN: [id number]  Procedure: Tracheostomy  Pre Procedure Assessment: ET Tube Size: 7.5 ET Tube secured at lip (cm): 24 Bite block in place: No Breath Sounds: Clear  Post Procedure Assessment: BP 126/78   Pulse 75   Temp 99.3 F (37.4 C) (Axillary)   Resp 16   Ht 5\' 10"  (1.778 m)   Wt 220 lb 10.9 oz (100.1 kg)   SpO2 100%   BMI 31.66 kg/m  O2 sats: stable throughout Complications: No apparent complications Patient did tolerate procedure well Tracheostomy Brand:Shiley Tracheostomy Style:Cuffed Tracheostomy Size: 8 Tracheostomy Secured HLK:[id number], velcro Tracheostomy Placement Confirmation:Trach cuff visualized and in place and Chest X ray ordered for placement    Kathie Dike 04/12/2017, 1:51 PM

## 2017-04-13 ENCOUNTER — Encounter (HOSPITAL_COMMUNITY)
Admission: RE | Disposition: A | Discharge status: Skilled Nursing Facility | Payer: Self-pay | Source: Other Acute Inpatient Hospital | Attending: Internal Medicine

## 2017-04-13 ENCOUNTER — Encounter (HOSPITAL_COMMUNITY): Payer: Self-pay | Admitting: General Surgery

## 2017-04-13 DIAGNOSIS — Z9911 Dependence on respirator [ventilator] status: Secondary | ICD-10-CM

## 2017-04-13 DIAGNOSIS — Z0189 Encounter for other specified special examinations: Secondary | ICD-10-CM

## 2017-04-13 DIAGNOSIS — Z978 Presence of other specified devices: Secondary | ICD-10-CM

## 2017-04-13 HISTORY — PX: ESOPHAGOGASTRODUODENOSCOPY: SHX5428

## 2017-04-13 HISTORY — PX: PEG PLACEMENT: SHX5437

## 2017-04-13 LAB — BASIC METABOLIC PANEL
Anion gap: 6 (ref 5–15)
BUN: 16 mg/dL (ref 6–20)
CALCIUM: 8.7 mg/dL — AB (ref 8.9–10.3)
CO2: 28 mmol/L (ref 22–32)
CREATININE: 0.67 mg/dL (ref 0.61–1.24)
Chloride: 103 mmol/L (ref 101–111)
GFR calc Af Amer: >60 mL/min (ref >60)
GLUCOSE: 125 mg/dL — AB (ref 65–99)
POTASSIUM: 4.1 mmol/L (ref 3.5–5.1)
SODIUM: 137 mmol/L (ref 135–145)

## 2017-04-13 LAB — CBC
HCT: 24.7 % — ABNORMAL LOW (ref 39.0–52.0)
Hemoglobin: 7.2 g/dL — ABNORMAL LOW (ref 13.0–17.0)
MCH: 28.9 pg (ref 26.0–34.0)
MCHC: 29.1 g/dL — ABNORMAL LOW (ref 30.0–36.0)
MCV: 99.2 fL (ref 78.0–100.0)
PLATELETS: 278 10*3/uL (ref 150–400)
RBC: 2.49 MIL/uL — ABNORMAL LOW (ref 4.22–5.81)
RDW: 19.3 % — AB (ref 11.5–15.5)
WBC: 12.9 10*3/uL — ABNORMAL HIGH (ref 4.0–10.5)

## 2017-04-13 LAB — GLUCOSE, CAPILLARY
GLUCOSE-CAPILLARY: 116 mg/dL — AB (ref 65–99)
GLUCOSE-CAPILLARY: 142 mg/dL — AB (ref 65–99)
GLUCOSE-CAPILLARY: 94 mg/dL (ref 65–99)
Glucose-Capillary: 128 mg/dL — ABNORMAL HIGH (ref 65–99)
Glucose-Capillary: 116 mg/dL — ABNORMAL HIGH (ref 65–99)
Glucose-Capillary: 156 mg/dL — ABNORMAL HIGH (ref 65–99)

## 2017-04-13 SURGERY — EGD (ESOPHAGOGASTRODUODENOSCOPY)
Anesthesia: Moderate Sedation

## 2017-04-13 MED ORDER — ASPIRIN 325 MG PO TABS
325.0000 mg | ORAL_TABLET | Freq: Every day | ORAL | Status: DC
  Administered 2017-04-14 – 2017-05-09 (×26): 325 mg
  Filled 2017-04-13 (×30): qty 1

## 2017-04-13 MED ORDER — CHLORHEXIDINE GLUCONATE 0.12 % MT SOLN
OROMUCOSAL | Status: AC
  Filled 2017-04-13: qty 15

## 2017-04-13 MED ORDER — VECURONIUM BROMIDE 10 MG IV SOLR: 10.0000 mg | Freq: Once | INTRAVENOUS | Status: DC

## 2017-04-13 MED ORDER — PROMETHAZINE HCL 25 MG PO TABS
12.5000 mg | ORAL_TABLET | ORAL | Status: DC | PRN
  Administered 2017-05-20: 25 mg
  Filled 2017-04-13: qty 1

## 2017-04-13 MED ORDER — ACETAMINOPHEN 325 MG PO TABS
650.0000 mg | ORAL_TABLET | ORAL | Status: DC | PRN
Start: 2017-04-13 — End: 2017-04-22
  Administered 2017-04-14 – 2017-04-22 (×11): 650 mg
  Filled 2017-04-13 (×11): qty 2

## 2017-04-13 MED ORDER — PRO-STAT SUGAR FREE PO LIQD
30.0000 mL | Freq: Two times a day (BID) | ORAL | Status: DC
  Administered 2017-04-13 – 2017-05-29 (×92): 30 mL
  Filled 2017-04-13 (×95): qty 30

## 2017-04-13 MED ORDER — FUROSEMIDE 10 MG/ML IJ SOLN
40.0000 mg | Freq: Once | INTRAMUSCULAR | Status: AC
  Administered 2017-04-13: 40 mg via INTRAVENOUS
  Filled 2017-04-13: qty 4

## 2017-04-13 MED ORDER — VECURONIUM BROMIDE 10 MG IV SOLR
10.0000 mg | Freq: Once | INTRAVENOUS | Status: AC
  Administered 2017-04-13: 10 mg via INTRAVENOUS
  Filled 2017-04-13: qty 10

## 2017-04-13 MED ORDER — GLUCERNA 1.2 CAL PO LIQD
1000.0000 mL | ORAL | Status: DC
  Administered 2017-04-13 – 2017-05-23 (×45): 1000 mL
  Filled 2017-04-13 (×73): qty 1000

## 2017-04-13 MED ORDER — MIDAZOLAM HCL 2 MG/2ML IJ SOLN
4.0000 mg | Freq: Once | INTRAMUSCULAR | Status: AC
  Administered 2017-04-13: 4 mg via INTRAVENOUS

## 2017-04-13 MED ORDER — FENTANYL CITRATE (PF) 100 MCG/2ML IJ SOLN
25.0000 ug | INTRAMUSCULAR | Status: DC | PRN
  Administered 2017-04-13 – 2017-04-14 (×2): 100 ug via INTRAVENOUS
  Filled 2017-04-13 (×2): qty 2

## 2017-04-13 NOTE — Evaluation (Signed)
Speech Language Pathology Evaluation Patient Details Name: Joe Wilkinson MRN: [id number] DOB: 1961-04-10 Today's Date: 04/13/2017 Time: 7829-5621 SLP Time Calculation (min) (ACUTE ONLY): 12 min  Problem List:  Patient Active Problem List   Diagnosis Date Noted  . Acute respiratory failure ([place])   . Compromised airway   . CVA (cerebral vascular accident) ([place]) 04/01/2017  . Cytotoxic cerebral edema ([place]) 04/01/2017  . 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 DKA (diabetic ketoacidoses) ([place]) 03/30/2017  . Encounter for central line placement   . Encounter for orogastric (OG) tube placement   . Hypovolemic shock ([place])    Past Medical History: No past medical history on file. Past Surgical History:  Past Surgical History:  Procedure Laterality Date  . CRANIECTOMY Right 04/01/2017   Procedure: RIGHT DECOMPRESSIVE CRANIECTOMY;  Surgeon: Ditty, Kevan Ny, MD;  Location: [place];  Service: Neurosurgery;  Laterality: Right;   HPI:  56 yo male brought to [facility] with altered mental status from DKA and sepsis. 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 Required Rt decompressive craniectomy and remained on vent post op.ETT 9/23-95, s/p tracheostomy 9/5.    Assessment / Plan / Recommendation Clinical Impression  Cognitive-linguistic evaluation limited as patient remains on ventilator with plans to attempt trach collar later this afternoon. Auditory comprehension appears largely intact for basic information. Note severe left side inattention with right gaze preference. Patient unable to cross midline despite max verbal, visual, and tactile cues. Oral motor exam revealed severely impaired CN VII and XII, possibly V, impacting oral strength and control, likely to result in motor speech impairment which is TBD once patient able to utilize PMV. SLP will f/u as patient will benefit from cognitive-linguistic treatment with more intensity once weaning from vent and able to tolerate PMV.     SLP  Assessment  SLP Recommendation/Assessment: Patient needs continued Speech Lanaguage Pathology Services SLP Visit Diagnosis: Cognitive communication deficit (R41.841)    Follow Up Recommendations  Inpatient Rehab    Frequency and Duration min 2x/week  2 weeks      SLP Evaluation Cognition  Overall Cognitive Status: Impaired/Different from baseline Arousal/Alertness: Awake/alert Orientation Level: Oriented to person;Disoriented to situation;Disoriented to place Attention: Focused;Sustained Focused Attention: Appears intact Sustained Attention: Impaired Sustained Attention Impairment: Verbal basic Memory:  (TBD) Awareness:  (TBD) Problem Solving:  (TBD) Safety/Judgment: Impaired (right hand tied and with mitten in place)       Comprehension  Auditory Comprehension Overall Auditory Comprehension: Appears within functional limits for tasks assessed (for basic 1-step commands, ID objects in f/o 2, Y/N question) Visual Recognition/Discrimination Discrimination: Within Function Limits Reading Comprehension Reading Status: Not tested    Expression Expression Primary Mode of Expression: Nonverbal - gestures Verbal Expression Overall Verbal Expression:  (unable to assess secondary to trach on vent)   Oral / Motor  Oral Motor/Sensory Function Overall Oral Motor/Sensory Function: Severe impairment Facial ROM: Reduced left;Suspected CN VII (facial) dysfunction Facial Symmetry: Abnormal symmetry left;Suspected CN VII (facial) dysfunction Facial Strength: Reduced left;Suspected CN VII (facial) dysfunction;Reduced right Facial Sensation:  (unknown-non-verbal due to vent) Lingual ROM: Reduced left;Suspected CN XII (hypoglossal) dysfunction Lingual Symmetry: Within Functional Limits Lingual Strength: Reduced;Suspected CN XII (hypoglossal) dysfunction Velum: Other (comment) (unable to assess due to decreased oral opening) Mandible: Other (Comment) (suspect mild-moderate  impairment) Motor Speech Overall Motor Speech: Other (comment) (unable to assess, suspect impaired due to oral weakness)   GO  [place], CCC-SLP [phone]  Jilda Kress Meryl 04/13/2017, 10:52 AM

## 2017-04-13 NOTE — Evaluation (Signed)
Clinical/Bedside Swallow Evaluation Patient Details  Name: Joe Wilkinson MRN: [id number] Date of Birth: 1961-04-11  Today's Date: 04/13/2017 Time: SLP Start Time (ACUTE ONLY): 50 SLP Stop Time (ACUTE ONLY): 1030 SLP Time Calculation (min) (ACUTE ONLY): 10 min  Past Medical History: No past medical history on file. Past Surgical History:  Past Surgical History:  Procedure Laterality Date  . CRANIECTOMY Right 04/01/2017   Procedure: RIGHT DECOMPRESSIVE CRANIECTOMY;  Surgeon: Ditty, Kevan Ny, MD;  Location: [place];  Service: Neurosurgery;  Laterality: Right;   HPI:  56 yo male brought to [facility] with altered mental status from DKA and sepsis. 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 Required Rt decompressive craniectomy and remained on vent post op.ETT 8/23-9/5, s/p tracheostomy 9/5.    Assessment / Plan / Recommendation Clinical Impression  Bedside swallow evaluation ordered by MD to assess for potential for po intake in the short term as patient scheduled for PEG placement today. Patient s/p tracheostomy 9/5, remains in full vent support with plans to wean to trach collar beginning today. Oral motor exam revealed severely impaired CN VII and XII, possibly V, impacting oral strength and control, with significantly decreasd strength and ROM of jaw, lips, and tongue. Poor secretion management as indicated by excessive thin secretions pooling in oral cavity, suctioned by SLP as unable to elicit a pharyngeal swallow despite patient being alert and receiving max verbal, tactile, and visual cueing. No po trials provided given risk of aspiration. Patient unlikely to be able to consume pos in the short term. Recommend placement of PMV at bedside once patient on trach collar with potential ice chip trials if appropriate to determine readiness for instrumental exam. SLP will follow along.  SLP Visit Diagnosis: Cognitive communication deficit (R41.841)    Aspiration Risk  Severe aspiration risk    Diet Recommendation NPO   Medication Administration: Via alternative means    Other  Recommendations Oral Care Recommendations: Oral care QID   Follow up Recommendations Inpatient Rehab      Frequency and Duration min 2x/week              Swallow Study   General HPI: 56 yo male brought to [facility] with altered mental status from DKA and sepsis. 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 Required Rt decompressive craniectomy and remained on vent post op.ETT 8/23-9/5, s/p tracheostomy 9/5.  Type of Study: Bedside Swallow Evaluation Previous Swallow Assessment: none Diet Prior to this Study: NPO Temperature Spikes Noted: No Respiratory Status: Ventilator;Trach Trach Size and Type: #8;Cuff;With PMSV not in place;Inflated History of Recent Intubation: Yes Length of Intubations (days): 14 days Date extubated: 04/12/17 Behavior/Cognition: Alert;Cooperative;Pleasant mood Oral Cavity Assessment: Excessive secretions Oral Care Completed by SLP: Recent completion by staff Oral Cavity - Dentition: Adequate natural dentition Vision:  (TBD) Self-Feeding Abilities: Needs assist Patient Positioning: Upright in bed Baseline Vocal Quality: Other (comment) (non-verbal due to trach, remains on full vent support) Volitional Swallow: Unable to elicit    Oral/Motor/Sensory Function Overall Oral Motor/Sensory Function: Severe impairment Facial ROM: Reduced left;Suspected CN VII (facial) dysfunction Facial Symmetry: Abnormal symmetry left;Suspected CN VII (facial) dysfunction Facial Strength: Reduced left;Suspected CN VII (facial) dysfunction;Reduced right Facial Sensation:  (unknown-non-verbal due to vent) Lingual ROM: Reduced left;Suspected CN XII (hypoglossal) dysfunction Lingual Symmetry: Within Functional Limits Lingual Strength: Reduced;Suspected CN XII (hypoglossal) dysfunction Velum: Other (comment) (unable to assess due to decreased oral opening) Mandible: Other (Comment) (suspect  mild-moderate impairment)   Ice Chips Ice chips:  Not tested   Thin Liquid Thin Liquid: Not tested    Nectar Thick Nectar Thick Liquid: Not tested   Honey Thick Honey Thick Liquid: Not tested   Puree Puree: Not tested   Solid   Joe Bartolini MA, CCC-SLP [phone]    Solid: Not tested        Joe Wilkinson Joe Wilkinson 04/13/2017,11:01 AM

## 2017-04-13 NOTE — Op Note (Signed)
[facility] Patient Name: Joe Wilkinson Procedure Date : 04/13/2017 MRN: [id number] Attending MD: Georganna Skeans , MD Date of Birth: 02/06/1961 CSN: [id number] Age: 56 Admit Type: Inpatient Procedure:                Upper GI endoscopy Indications:              Place PEG because patient is unable to eat due to                            stroke (CVA) Providers:                Georganna Skeans, MD, Brigid Re, PAC; Cherylynn Ridges, Technician Referring MD:              Medicines:                Fentanyl 50 micrograms IV, Midazolam 4 mg IV Complications:            No immediate complications. Estimated blood loss:                            Minimal. Estimated Blood Loss:      Procedure:                Pre-Anesthesia Assessment:                           - Prior to the procedure, a History and Physical                            was performed, and patient medications and                            allergies were reviewed. The patient is unable to                            give consent secondary to the patient's altered                            mental status. The risks and benefits of the                            procedure and the sedation options and risks were                            discussed with the patient's relative. All                            questions were answered and informed consent was                            obtained. Patient identification and proposed                            procedure  were verified by the physician, the nurse                            and the technician in the procedure room. Mental                            Status Examination: sedated. ASA Grade Assessment:                            III - A patient with severe systemic disease. After                            reviewing the risks and benefits, the patient was                            deemed in satisfactory condition to undergo the                procedure. The anesthesia plan was to use moderate                            sedation / analgesia (conscious sedation).                            Immediately prior to administration of medications,                            the patient was re-assessed for adequacy to receive                            sedatives. The heart rate, respiratory rate, oxygen                            saturations, blood pressure, adequacy of pulmonary                            ventilation, and response to care were monitored                            throughout the procedure. The physical status of                            the patient was re-assessed after the procedure.                           After obtaining informed consent, the endoscope was                            passed under direct vision. Throughout the                            procedure, the patient's blood pressure, pulse, and                            oxygen  saturations were monitored continuously. The                            EG-[id number] ([id number]) scope was introduced through the                            mouth, and advanced to the duodenal bulb. The upper                            GI endoscopy was accomplished without difficulty.                            The patient tolerated the procedure well. Scope In: Scope Out: Findings:      No gross lesions were noted in the entire esophagus.      No gross lesions were noted [Site]. Placement of an externally removable       PEG with no T-fasteners was successfully completed. The external bumper       was at the 4.0 cm marking on the tube. Estimated blood loss was minimal.      No gross lesions were noted in the duodenal bulb. Impression:               - No gross lesions in esophagus.                           - No gross lesions in the stomach.                           - No gross lesions in the duodenal bulb.                           - An externally removable PEG placement  was                            successfully completed.                           - No specimens collected. Recommendation:           meds via PEG immediately, tube feeds re-start 4                            hours after placement Procedure Code(s):        --- Professional ---                           [id number], Esophagogastroduodenoscopy, flexible,                            transoral; with directed placement of percutaneous                            gastrostomy tube Diagnosis Code(s):        --- Professional ---                           Y40.347, Other sequelae  of cerebral infarction                           R63.3, Feeding difficulties                           Z43.1, Encounter for attention to gastrostomy CPT copyright 2016 [employer]. All rights reserved. The codes documented in this report are preliminary and upon coder review may  be revised to meet current compliance requirements. Georganna Skeans, MD 04/13/2017 1:18:47 PM This report has been signed electronically. Number of Addenda: 0

## 2017-04-13 NOTE — Plan of Care (Signed)
Problem: Nutrition: Goal: Risk of aspiration will decrease Outcome: Progressing PEG tube placed today

## 2017-04-13 NOTE — Plan of Care (Signed)
Problem: Education: Goal: Knowledge of about tracheostomy will improve Outcome: Progressing Education given to family

## 2017-04-13 NOTE — Progress Notes (Signed)
Wound check Craniotomy site: Staples present. No redness, drainage, warmth. Stable swelling. No sign of infection. No new recs. Likely remove staples tomorrow.

## 2017-04-13 NOTE — Progress Notes (Signed)
[place] Progress Note Patient Name: Joe Wilkinson DOB: 08-29-1960 MRN: [id number]   Date of Service  04/13/2017  HPI/Events of Note  Agitation - Request to renew soft R wrist restraint.   eICU Interventions  Will renew soft R wrist restraint.      Intervention Category Minor Interventions: Agitation / anxiety - evaluation and management  Sommer,Steven Eugene 04/13/2017, 7:55 PM

## 2017-04-13 NOTE — Progress Notes (Signed)
PULMONARY / CRITICAL CARE MEDICINE   Name: Joe Wilkinson MRN: [id number] DOB: 06/10/1961    ADMISSION DATE:  03/31/2017 CHIEF COMPLAINT:  CVA  HISTORY OF PRESENT ILLNESS:   56 yo male brought to [facility] with altered mental status from DKA and sepsis.  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  Required Rt decompressive craniectomy and remained on vent post op.  SUBJECTIVE:  No events overnight. Trach yesterday.  Tol well.  For PEG today.   VITAL SIGNS: BP 115/68   Pulse 83   Temp 99.2 F (37.3 C) (Oral)   Resp 18   Ht 5\' 10"  (1.778 m)   Wt 97.5 kg (214 lb 15.2 oz)   SpO2 97%   BMI 30.84 kg/m   VENTILATOR SETTINGS: Vent Mode: PRVC FiO2 (%):  [30 %-100 %] 40 % Set Rate:  [16 bmp-18 bmp] 18 bmp Vt Set:  [550 mL] 550 mL PEEP:  [5 cmH20] 5 cmH20 Plateau Pressure:  [13 cmH20-18 cmH20] 18 cmH20  INTAKE / OUTPUT: I/O last 3 completed shifts: In: 2325 [I.V.:149.7; NG/GT:1975.3; IV Piggyback:200] Out: 7169 [Urine:3650]  PHYSICAL EXAMINATION: Gen:      Awake, NAD HEENT:  [place]/AT, PERRL, EOM-I and MMM Neck:      Trach c/d Lungs:    resps even non labored on vent, oral secretions, CTA bilaterally CV:         RRR, Nl S1/S2, -M/R/G. Abd:      Soft, NT, ND and +BS Ext:    1+ BLE edema  Skin:       Warm, intact Neuro:   Awake, moving R arm, tracks, follows commands per RN but did not for me    BMET  Recent Labs Lab 04/11/17 0219  04/12/17 0151 04/12/17 0833 04/13/17 0239  NA 143  < > 139 138 137  K 3.9  --  3.8  --  4.1  CL 111  --  106  --  103  CO2 27  --  26  --  28  BUN 12  --  15  --  16  CREATININE 0.82  --  0.75  --  0.67  GLUCOSE 209*  --  147*  --  125*  < > = values in this interval not displayed.  Electrolytes  Recent Labs Lab 04/09/17 0407  04/11/17 0219 04/12/17 0151 04/13/17 0239  CALCIUM 8.3*  < > 8.7* 8.5* 8.7*  MG 2.4  --  2.1 2.2  --   PHOS 3.7  --  3.6 4.4  --   < > = values in this interval not displayed.  CBC  Recent Labs Lab  04/11/17 0219 04/12/17 0151 04/13/17 0239  WBC 16.8* 15.1* 12.9*  HGB 7.7* 7.1* 7.2*  HCT 25.9* 24.2* 24.7*  PLT 251 251 278    Coag's  Recent Labs Lab 04/09/17 1100 04/12/17 1233  APTT 32  --   INR 1.15 1.08    Sepsis Markers No results for input(s): LATICACIDVEN, PROCALCITON, O2SATVEN in the last 168 hours.  ABG  Recent Labs Lab 04/08/17 0417 04/12/17 0342  PHART 7.407 7.471*  PCO2ART 44.9 40.8  PO2ART 88.8 81.0*    Liver Enzymes  Recent Labs Lab 04/07/17 1009  AST 22  ALT 15*  ALKPHOS 51  BILITOT 0.6  ALBUMIN 1.3*    Cardiac Enzymes No results for input(s): TROPONINI, PROBNP in the last 168 hours.  Glucose  Recent Labs Lab 04/12/17 1218 04/12/17 1645 04/12/17 2002 04/12/17 2337 04/13/17 0351  04/13/17 0834  GLUCAP 123* 152* 159* 154* 116* 128*    Imaging No results found.  STUDIES:  LP 8/24 >> glucose 205, RBC 16, protein 62, WBC 5 CT head 8/24 >> Large Rt MCA EEG 8/27 >> bilateral cerebral dysfunction, non-specific in etiology Echo 8/27 >> EF 65 to 70% CT abd/pelvis 8/30 >> third spacing, 3.5 cm low attenuation Lt hepatic lobe  CULTURES: CSF 8/24>> negative BCX 8/24>> negative UCX 8/24>> negative Sputum 8/24>> S. agalactiae  ANTIBIOTICS: vanco 8/24 >> 8/26 Zosyn 8/24 >> 8/26, 9/1 >> Ampicillin 8/27 > 8/30  SIGNIFICANT EVENTS: 8/23 Admit [facility] 8/23 8/24 Transfer to [facility] after Rt MCA CVA, start 3% NS 8/25 Decompressive craniectomy 8/27 Transfuse 1 unit PRBC 8/28 Transfuse 1 unit PRBC, A fib with RVR 8/30 Transfuse 1 unit PRBC 8/31 Off pressors, reintubated x 2, transfuse PRBC  LINES/TUBES: ETT 8/23 >>9/5 Trach [name]) 9/5>>> Lt IJ CVL 8/23 >>  ASSESSMENT / PLAN:  PULMONARY A: Compromised airway. Aspiration Small pleural effusion  P:   F/u CXR  ATC trials post PEG placement  Mobilize as able  Gentle diuresis   CARDIOVASCULAR A:  Septic shock >> resolved. A fib with RVR >> back in sinus rhythm. P:  PRN  lopressor Tele monitoring  RENAL A:   Medically induced hypernatremia - resolved.  P:  F/u chem  Replace electrolytes as indicated Gentle diuresis as above - 10L POS   GASTROINTESTINAL A:   Nutrition. Dysphagia. Low Hb but evidence of active bleed P:   For PEG 9/6 TF on hold for PEG Resume post procedure  PPI for ulcer prophylaxis. Increase to q12 as he may have stress gastritis  HEMATOLOGIC A:   Persistent anemia >> no evidence for GI bleeding, and CT abdomen negative. No evidence of hemolysis. No schistocytes P:  F/u CBC Transfuse for Hb < 7  INFECTIOUS A:   Sepsis - sputum culture positive for S. Agalactiae >> completed Abx 8/30. Restarted on zosyn P:   Continue zosyn  Follow cultures   ENDOCRINE A:   DKA - resolved. DM type I. 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 S/p decompressive craniectomy. P:   RASS goal 0 to -1 Neurosurgery s/o 04/02/17 >> recommend d/cing sutures 10 to 14 days after surgery, and f/u with neurosurgery in 3 months to assess for cranioplasty Continue ASA   Family: no family available 9/6. Discussed via phone 9/4.    Requesting moving to [place] for long term care when ready.    Nickolas Madrid, NP 04/13/2017  9:35 AM Pager: [phone] or [phone]   STAFF NOTE: I, Merrie Roof, MD FACP have personally reviewed patient's available data, including medical history, events of note, physical examination and test results as part of my evaluation. I have discussed with resident/NP and other care providers such as pharmacist, RN and RRT. In addition, I personally evaluated patient and elicited key findings of: awake, follows commands well, appears weak, trach clean, abdo soft , brain rt swollen wound, clean wound head, edema remains, pcxr I reviewed shows int edema, was neg 1 liter would prefer to keep him neg with lasix, chem in am , would keep same MV on ventr rest, post peg for  aggressive TC weaning after more awake, we should have goals of TC weaning, for peg agree is required, some anemia, hope will concentrate hgb with lasix as well, I updated pt in full,  The  patient is critically ill with multiple organ systems failure and requires high complexity decision making for assessment and support, frequent evaluation and titration of therapies, application of advanced monitoring technologies and extensive interpretation of multiple databases.   Critical Care Time devoted to patient care services described in this note is 35 Minutes. This time reflects time of care of this signee: Merrie Roof, MD FACP. This critical care time does not reflect procedure time, or teaching time or supervisory time of PA/NP/Med student/Med Resident etc but could involve care discussion time. Rest per NP/medical resident whose note is outlined above and that I agree with   Lavon Paganini. Titus Mould, MD, [place] Pgr: [facility] 04/13/2017 10:13 AM

## 2017-04-13 NOTE — Progress Notes (Signed)
STROKE TEAM PROGRESS NOTE   SUBJECTIVE (INTERVAL HISTORY)  . No neuro changes. Hb remains stable at 24.7     He had tracheostomy yesterday and is tolerating it well. Plan for PEG tube today by trauma team OBJECTIVE Temp:  [98.7 F (37.1 C)-99.4 F (37.4 C)] 99.4 F (37.4 C) (09/06 1542) Pulse Rate:  [68-105] 90 (09/06 1500) Cardiac Rhythm: Normal sinus rhythm (09/06 1300) Resp:  [18-32] 18 (09/06 1500) BP: (101-160)/(57-126) 108/68 (09/06 1500) SpO2:  [97 %-100 %] 100 % (09/06 1500) FiO2 (%):  [30 %-60 %] 40 % (09/06 1215) Weight:  [214 lb 15.2 oz (97.5 kg)] 214 lb 15.2 oz (97.5 kg) (09/06 0500) Examination:  . Afebrile. Head is nontraumatic. Neck is supple without bruit.    Cardiac exam no murmur or gallop. Lungs are clear to auscultation. Distal pulses are well felt.   Lungs : CTA Heart: RRR Abdomen: soft NT Neurological Exam :  S/p tracheostomy, drowsy and barely eyes open on voice. Follows simple commands on the right UE and LE. Right gaze preference, not able to cross midline to the left. Pupils 3 mm reactive to light. Fundi could not be visualized. Corneal, gag and cough are present. Patient is able to move right upper and lower extremity spontaneously and against gravity with brisk withdrawal response to pain. Dense left hemiplegia with decreased tone. Reflexes present on the right and absent on the left. Left plantar upgoing right equivocal.   CBC:   Recent Labs Lab 04/12/17 0151 04/13/17 0239  WBC 15.1* 12.9*  HGB 7.1* 7.2*  HCT 24.2* 24.7*  MCV 98.4 99.2  PLT 251 644    Basic Metabolic Panel:   Recent Labs Lab 04/11/17 0219  04/12/17 0151 04/12/17 0833 04/13/17 0239  NA 143  < > 139 138 137  K 3.9  --  3.8  --  4.1  CL 111  --  106  --  103  CO2 27  --  26  --  28  GLUCOSE 209*  --  147*  --  125*  BUN 12  --  15  --  16  CREATININE 0.82  --  0.75  --  0.67  CALCIUM 8.7*  --  8.5*  --  8.7*  MG 2.1  --  2.2  --   --   PHOS 3.6  --  4.4  --   --   <  > = values in this interval not displayed.  Lipid Panel:     Component Value Date/Time   CHOL 93 04/02/2017 0607   TRIG 135 04/02/2017 0607   HDL 28 (L) 04/02/2017 0607   CHOLHDL 3.3 04/02/2017 0607   VLDL 27 04/02/2017 0607   LDLCALC 38 04/02/2017 0607   HgbA1c:  Lab Results  Component Value Date   HGBA1C 14.3 (H) 04/02/2017   Urine Drug Screen: No results found for: LABOPIA, COCAINSCRNUR, LABBENZ, AMPHETMU, THCU, LABBARB  Alcohol Level No results found for: ETH  IMAGING   Ct Head Wo Contrast 03/31/2017 1. Progressive large right MCA territory infarct with involvement of the right occipital lobe likely related to a fetal type right posterior cerebral artery.  2. Diffuse edema and mass effect with 12 mm midline shift and partial effacement of the para mesencephalic cisterns.  3. Developing left posterior watershed territory infarct.  4. Nasal fluid and bilateral sinus disease likely related to intubation.   Ct Head Wo Contrast 03/30/2017 No acute intracranial abnormality identified. Unremarkable CT of the brain.  Ct Angio Head and neck W Or Wo Contrast 04/02/2017 IMPRESSION:  1. Occlusion of the right middle cerebral artery proximal M1 segment with extensive cytotoxic edema throughout the entire right MCA distribution.  2. Status post right pterional craniectomy for decompression with unchanged leftward midline shift measuring 8 mm.  3. No hemorrhage.  4. Normal carotid arteries.  No embolic source identified.   LE venous doppler - No evidence of deep vein thrombosis or baker's cysts bilaterally.  EEG - This EEG is abnormal due to bilateral slowing with suppression of the right hemisphere. Clinical Correlation of the above findings indicates bilateral cerebral dysfunction that is non-specific in etiology and can be seen with hypoxic/ischemic injury, toxic/metabolic encephalopathies, neurodegenerative disorders, or medication effect.  The absence of epileptiform discharges  does not rule out a clinical diagnosis of epilepsy.  Clinical correlation is advised.  TTE Left ventricle: The cavity size was normal. There was severe   concentric hypertrophy. Systolic function was vigorous. The   estimated ejection fraction was in the range of 65% to 70%. Wall   motion was normal; there were no regional wall motion   abnormalities. Left ventricular diastolic function parameters   were normal. - Atrial septum: There was increased thickness of the septum,   consistent with lipomatous hypertrophy. - Pulmonary arteries: Systolic pressure could not be accurately   estimated.  Ct Head Wo Contrast 04/05/2017 IMPRESSION:  1. Evolving acute extensive RIGHT cerebrum infarcts including RIGHT MCA, to lesser extent RIGHT ACA and RIGHT PCA territories. Petechial hemorrhage without hemorrhagic conversion.  2. Similar 7 mm RIGHT to LEFT midline shift without ventricular entrapment. Similar external herniation via RIGHT craniectomy defect.  3. Similar acute small LEFT parietal lobe infarct.   Ct Abdomen Pelvis Wo Contrast 04/06/2017 IMPRESSION:  No evidence of retroperitoneal hematoma. Diffuse mesenteric and retroperitoneal soft tissue stranding and body wall edema, consistent with third spacing. No evidence of focal inflammatory process or abscess. Tiny left pleural effusion and bibasilar atelectasis noted. Indeterminate 3.5 cm low-attenuation lesion in left hepatic lobe. Consider further characterization with hepatic protocol abdomen CT with contrast (or MRI if patient can cooperate with breath-holding).    PHYSICAL EXAM   Vitals:   04/13/17 1300 04/13/17 1400 04/13/17 1500 04/13/17 1542  BP: 114/73 110/73 108/68   Pulse: 87 89 90   Resp: 18 18 18    Temp:    99.4 F (37.4 C)  TempSrc:    Axillary  SpO2: 100% 100% 100%   Weight:      Height:       53 African-American male who is sedated and intubated. Craniotomy bandage on the right skull. Afebrile. Head is  nontraumatic. Cardiac exam no murmur or gallop. Distal pulses are well felt. Neurological Exam :  Status post tracheostomy, eyes open on voice. Follows simple commands on the right UE and LE. Right gaze preference, not able to cross midline to the left. Pupils 3 mm reactive to light. Fundi could not be visualized. Corneal, gag and cough are present. Patient is able to move right upper and lower extremity spontaneously and against gravity with brisk withdrawal response to pain. Dense left hemiplegia with decreased tone. Reflexes present on the right and absent on the left. Left plantar upgoing right equivocal. Sensation, coordination and gait not tested.   ASSESSMENT/PLAN Joe Wilkinson is a 56 y.o. male with history of diabetes mellitus (other past medical history unknown at this time) presenting with unresponsiveness. He did not receive IV t-PA due to decompressive right  frontotemporoparietal craniectomy for decompression.  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 S/p right decompressive hemicraniectomy.   Resultant  left hemiplegia and right gaze   CT head - Progressive large right MCA territory infarct with involvement of the right occipital lobe likely related to a fetal type right PCA. Diffuse edema and mass effect. Developing left posterior watershed territory infarct.   CTA head and neck - right M1 cut off, b/l fetal PCAs with dimunitive BA and VAs  MRI head - consider once stabilized  2D Echo - EF 65-70%  EEG no seizure  LDL - 38   HgbA1c - 14.3  VTE prophylaxis - SCDs (not on lovenox due to critical anemia needed repeated PRBC) Diet NPO time specified  No antithrombotic prior to admission, now on ASA 325mg   Patient will be counseled to be compliant with his antithrombotic medications  Ongoing aggressive stroke risk factor management  Therapy recommendations:  pending  Disposition:   Pending  Cerebral edema  CT head large right MCA infarct with diffuse edema and midline shift  Status post decompressive hemicraniectomy  Neurosurgery on board  Repeat CT evolving right malignant MCA, stable   Sodium 158->155->158->154->150    on 3% saline plan to taper and stop over next 2 days  Sodium goal of 150-160, continue monitoring Q6h  PAF - could be potential source of current stroke  Confirmed with EKG  HR can go up to 140-150s  Reversed by amiodarone  Now off amiodarone  CCM on board  Acute blood loss anemia - no source found so far  Source unclear, could be due to surgery  Hb 12.6 - 10.9 - 8.2 - 7.0 - 6.2 - PRBC - 7.5 -8.1-> 6.6->PRBC->7.2->6.7-> PRBC->7.8->7.0->6.6 - PRBC  S/p repeated PRBC transfusion  CT abdomen - no retroperitoneal hematoma  No bloody or dark bowel movement  Stool occult blood - pending ( ordered 04/06/2017 - spoke with nursing today to reinforce need)  CBC monitoring - H/H 7.8/25.8 Thursday - recheck in AM  Diabetes with DKA  HgbA1c 14.3, goal < 7.0  Uncontrolled  Off insulin drip, on Levemir  Still significant hyperglycemia  DM nurse on board, appreciate help  CCM on board  Leukocytosis,? Sepsis  WBC 18.8 -> 26.2->25.5->24.0->20.1->17.3  Off Abx  off Neo now  CCM on board  Hypertension  Stable off Neo for hypotension BP goal 120-160 Long-term BP goal normotensive  Other Stroke Risk Factors  Obesity, Body mass index is 30.84 kg/m., recommend weight loss, diet and exercise as appropriate   Other Active Problems  Induced hypernatremia  Thrombocytopenia - resolved  TF @ 75  Cortrak Tube - placed   04/08/2017  Hospital day # 12 Plan the patient  will get  PEG today  . No family available at bedside for discussion.D/w Dr Titus Mould This patient is critically ill due to right malignant MCA infarct status post hemicraniectomy, embolic infarcts to left MCA, PAF, diabetes and DKA, sepsis, leukocytosis,  question procedure and at significant risk of neurological worsening, death form cerebral edema, ankle herniation, heart failure, status epilepticus, septic shock, DKA, hemorrhagic transformation. This patient's care requires constant monitoring of vital signs, hemodynamics, respiratory and cardiac monitoring, review of multiple databases, neurological assessment, discussion with family, other specialists and medical decision making of high complexity. I spent 30 minutes of neurocritical care time in the care of this patient. I have personally examined this patient, reviewed notes, independently viewed imaging studies,  participated in medical decision making and plan of care.ROS completed by me personally and pertinent positives fully documented  I have made any additions or clarifications directly to the above note.    Antony Contras, MD Medical Director [place] Pager: [phone] 04/13/2017 4:06 PM    To contact Stroke Continuity provider, please refer to [email or web address] After hours, contact General Neurology

## 2017-04-13 NOTE — Progress Notes (Signed)
Patient ID: Joe Wilkinson, male   DOB: [birth year], 56 y.o.   MRN: [id number] For bedside PEG today. On Zosyn. Abd soft, NT, ND. TF held.  Georganna Skeans, MD, MPH, FACS Trauma: [phone] General Surgery: [phone]

## 2017-04-14 ENCOUNTER — Encounter (HOSPITAL_COMMUNITY): Payer: Self-pay

## 2017-04-14 LAB — BASIC METABOLIC PANEL
Anion gap: 7 (ref 5–15)
BUN: 15 mg/dL (ref 6–20)
CO2: 28 mmol/L (ref 22–32)
CREATININE: 0.79 mg/dL (ref 0.61–1.24)
Calcium: 9 mg/dL (ref 8.9–10.3)
Chloride: 102 mmol/L (ref 101–111)
GFR calc non Af Amer: >60 mL/min (ref >60)
Glucose, Bld: 192 mg/dL — ABNORMAL HIGH (ref 65–99)
Potassium: 3.7 mmol/L (ref 3.5–5.1)
Sodium: 137 mmol/L (ref 135–145)

## 2017-04-14 LAB — GLUCOSE, CAPILLARY
GLUCOSE-CAPILLARY: 195 mg/dL — AB (ref 65–99)
GLUCOSE-CAPILLARY: 151 mg/dL — AB (ref 65–99)
Glucose-Capillary: 195 mg/dL — ABNORMAL HIGH (ref 65–99)
Glucose-Capillary: 169 mg/dL — ABNORMAL HIGH (ref 65–99)
Glucose-Capillary: 176 mg/dL — ABNORMAL HIGH (ref 65–99)

## 2017-04-14 LAB — CBC
HEMATOCRIT: 25.6 % — AB (ref 39.0–52.0)
HEMOGLOBIN: 7.7 g/dL — AB (ref 13.0–17.0)
MCH: 29.5 pg (ref 26.0–34.0)
MCHC: 30.1 g/dL (ref 30.0–36.0)
MCV: 98.1 fL (ref 78.0–100.0)
Platelets: 337 10*3/uL (ref 150–400)
RBC: 2.61 MIL/uL — ABNORMAL LOW (ref 4.22–5.81)
RDW: 19.2 % — ABNORMAL HIGH (ref 11.5–15.5)
WBC: 13.1 10*3/uL — ABNORMAL HIGH (ref 4.0–10.5)

## 2017-04-14 LAB — POCT I-STAT 3, ART BLOOD GAS (G3+)
ACID-BASE EXCESS: 9 mmol/L — AB (ref 0.0–2.0)
BICARBONATE: 32.2 mmol/L — AB (ref 20.0–28.0)
O2 Saturation: 93 %
PH ART: 7.519 — AB (ref 7.350–7.450)
TCO2: 33 mmol/L — ABNORMAL HIGH (ref 22–32)
pCO2 arterial: 39.6 mmHg (ref 32.0–48.0)
pO2, Arterial: 60 mmHg — ABNORMAL LOW (ref 83.0–108.0)

## 2017-04-14 MED ORDER — FUROSEMIDE 10 MG/ML IJ SOLN
40.0000 mg | Freq: Once | INTRAMUSCULAR | Status: AC
  Administered 2017-04-15: 40 mg via INTRAVENOUS
  Filled 2017-04-14: qty 4

## 2017-04-14 MED ORDER — FUROSEMIDE 10 MG/ML IJ SOLN: 40.0000 mg | Freq: Once | INTRAMUSCULAR | Status: DC

## 2017-04-14 MED ORDER — FENTANYL CITRATE (PF) 100 MCG/2ML IJ SOLN
12.5000 ug | INTRAMUSCULAR | Status: DC | PRN
  Administered 2017-04-14 – 2017-04-22 (×2): 12.5 ug via INTRAVENOUS
  Filled 2017-04-14 (×3): qty 2

## 2017-04-14 MED ORDER — FUROSEMIDE 10 MG/ML IJ SOLN
40.0000 mg | Freq: Three times a day (TID) | INTRAMUSCULAR | Status: DC
  Administered 2017-04-14: 40 mg via INTRAVENOUS
  Filled 2017-04-14: qty 4

## 2017-04-14 NOTE — Progress Notes (Signed)
Patient is on ATC with vent in the room. Will monitor patient throughout the night. Sat is 97% on 40% ATC.

## 2017-04-14 NOTE — Progress Notes (Signed)
PULMONARY / CRITICAL CARE MEDICINE   Name: Joe Wilkinson MRN: [id number] DOB: [birth year]    ADMISSION DATE:  03/31/2017 CHIEF COMPLAINT:  CVA  HISTORY OF PRESENT ILLNESS:   56 yo male brought to [facility] with altered mental status from DKA and sepsis.  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  Required Rt decompressive craniectomy and remained on vent post op.  SUBJECTIVE:  No events overnight. Trach 9/5.  Tol well. PEG placed  9/6. Some agitation overnight, placed in soft wrist restraint. On ATC this am.   VITAL SIGNS: BP 116/79   Pulse 98   Temp 99.4 F (37.4 C) (Axillary)   Resp (!) 26   Ht 5\' 10"  (1.778 m)   Wt 215 lb 2.7 oz (97.6 kg)   SpO2 100%   BMI 30.87 kg/m   VENTILATOR SETTINGS: Vent Mode: PRVC FiO2 (%):  [40 %] 40 % Set Rate:  [18 bmp] 18 bmp Vt Set:  [550 mL] 550 mL PEEP:  [5 cmH20] 5 cmH20 Pressure Support:  [8 cmH20] 8 cmH20 Plateau Pressure:  [15 cmH20-20 cmH20] 19 cmH20  INTAKE / OUTPUT: I/O last 3 completed shifts: In: 1738.2 [I.V.:95.2; NG/GT:1443; IV Piggyback:200] Out: 5277 [Urine:5975]  PHYSICAL EXAMINATION: Gen:      Awake, on ATC, in NAD HEENT:  [place]/AT, PERRL, EOM-I and MMM Neck:      Trach c/d/I , midline and secure Lungs:    resps even non labored on vent, oral secretions, rhonchi  bilaterally CV:         RRR, Nl S1/S2, -M/R/G. Abd:      Soft, NT, ND and +BS, Peg in place, CDI, TF at goal Ext:    1+ BLE edema  Skin:       Warm, intact, without rash or lesions Neuro:   Awake, moving R arm, tracks, follows some  Commands, waved with right hand.  BMET  Recent Labs Lab 04/12/17 0151 04/12/17 0833 04/13/17 0239 04/14/17 0451  NA 139 138 137 137  K 3.8  --  4.1 3.7  CL 106  --  103 102  CO2 26  --  28 28  BUN 15  --  16 15  CREATININE 0.75  --  0.67 0.79  GLUCOSE 147*  --  125* 192*    Electrolytes  Recent Labs Lab 04/09/17 0407  04/11/17 0219 04/12/17 0151 04/13/17 0239 04/14/17 0451  CALCIUM 8.3*  < > 8.7* 8.5* 8.7* 9.0   MG 2.4  --  2.1 2.2  --   --   PHOS 3.7  --  3.6 4.4  --   --   < > = values in this interval not displayed.  CBC  Recent Labs Lab 04/12/17 0151 04/13/17 0239 04/14/17 0451  WBC 15.1* 12.9* 13.1*  HGB 7.1* 7.2* 7.7*  HCT 24.2* 24.7* 25.6*  PLT 251 278 337    Coag's  Recent Labs Lab 04/09/17 1100 04/12/17 1233  APTT 32  --   INR 1.15 1.08    Sepsis Markers No results for input(s): LATICACIDVEN, PROCALCITON, O2SATVEN in the last 168 hours.  ABG  Recent Labs Lab 04/08/17 0417 04/12/17 0342  PHART 7.407 7.471*  PCO2ART 44.9 40.8  PO2ART 88.8 81.0*    Liver Enzymes  Recent Labs Lab 04/07/17 1009  AST 22  ALT 15*  ALKPHOS 51  BILITOT 0.6  ALBUMIN 1.3*    Cardiac Enzymes No results for input(s): TROPONINI, PROBNP in the last 168 hours.  Glucose  Recent Labs Lab 04/13/17 1134 04/13/17 1536 04/13/17 1928 04/13/17 2349 04/14/17 0409 04/14/17 0742  GLUCAP 94 116* 156* 142* 195* 169*    Imaging No results found.  STUDIES:  LP 8/24 >> glucose 205, RBC 16, protein 62, WBC 5 CT head 8/24 >> Large Rt MCA EEG 8/27 >> bilateral cerebral dysfunction, non-specific in etiology Echo 8/27 >> EF 65 to 70% CT abd/pelvis 8/30 >> third spacing, 3.5 cm low attenuation Lt hepatic lobe CT Head 9/3>> Severe right hemisphere cytotoxic edema with stable extension of edematous brain through the right craniectomy defect. Stable petechial hemorrhage without malignant hemorrhagic Transformation. Stable intracranial mass effect with leftward midline shift of 7 mm. Small left PCA territory infarct.   CULTURES: CSF 8/24>> negative BCX 8/24>> negative UCX 8/24>> negative Sputum 8/24>> S. Agalactiae Sputum 9/1>> Consistent with normal resp. flora  ANTIBIOTICS: vanco 8/24 >> 8/26 Zosyn 8/24 >> 8/26, 9/1 >> Ampicillin 8/27 > 8/30  SIGNIFICANT EVENTS: 8/23 Admit [facility] 8/23 8/24 Transfer to [facility] after Rt MCA CVA, start 3% NS 8/25 Decompressive craniectomy 8/27  Transfuse 1 unit PRBC 8/28 Transfuse 1 unit PRBC, A fib with RVR 8/30 Transfuse 1 unit PRBC 8/31 Off pressors, reintubated x 2, transfuse PRBC 9/5 Trach 9/6 PEG  LINES/TUBES: ETT 8/23 >>9/5 Trach [name]) 9/5>>> Lt IJ CVL 8/23 >> PEG 9/6  ASSESSMENT / PLAN:  PULMONARY A: Compromised airway. Aspiration Small pleural effusion  9/7 Tolerating ATC P:   F/u CXR  Continue ATC trials Mobilize as able  lasix  CARDIOVASCULAR A:  Septic shock >> resolved. A fib with RVR >> back in sinus rhythm. P:  PRN lopressor Tele monitoring Maintain MAP > 65  RENAL A:   Medically induced hypernatremia - resolved.  P:  F/u chem  Replace electrolytes as indicated Gentle diuresis as above - 7 L POS   GASTROINTESTINAL A:   Nutrition. Dysphagia. Low Hb but evidence of active bleed P:    PEG 9/6 TF at goal  PPI for ulcer prophylaxis. Increase to q12 as he may have stress gastritis  HEMATOLOGIC A:   Persistent anemia >> no evidence for GI bleeding, and CT abdomen negative. No evidence of hemolysis. No schistocytes P:  F/u CBC Transfuse for Hb < 7 Monitor  for any obvious signs of bleeding  INFECTIOUS A:   Sepsis - sputum culture positive for S. Agalactiae >> completed Abx 8/30. Restarted on zosyn Leukocytosis>> T Max last 24= 100.1 P:   Continue zosyn  Follow cultures  Trend WBC and Fever curve  ENDOCRINE A:   DKA - resolved. DM type I. 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 S/p decompressive craniectomy. P:   RASS goal 0 to -1 Neurosurgery s/o 04/02/17 >> recommend d/cing sutures 10 to 14 days after surgery, and f/u with neurosurgery in 3 months to assess for cranioplasty Continue ASA   Family: no family available 9/7. Discussed via phone 9/4.    Requesting moving to [facility] for long term care when ready.    Magdalen Spatz, AGACNP-BC [facility] 04/14/2017  8:45 AM Pager:   [phone]   STAFF NOTE: Linwood Dibbles, MD FACP have personally reviewed patient's available data, including medical history, events of note, physical examination and test results as part of my evaluation. I have discussed with resident/NP and other care providers such as pharmacist, RN and RRT. In addition, I personally evaluated patient  and elicited key findings of: awake, FC well, moves rt side well, swelling rt crani unchanged from brain swelling, wound clean, crackles bases, pcxr which I reviewed shows base int edema, goal is trach collar this am and assess abg at 6 pm , ability to remain on trach collar, if fails then to PS 10 as goal then nocturnal rest vent, goal is 24 hours trach collar if able, he was neg 3.8 liters and would maintain same lasix dosing provided with close follo wup bmet in am , he tolerated diuresis swell, mobilize aggressive, I attempted to update pt in full The patient is critically ill with multiple organ systems failure and requires high complexity decision making for assessment and support, frequent evaluation and titration of therapies, application of advanced monitoring technologies and extensive interpretation of multiple databases.   Critical Care Time devoted to patient care services described in this note is 30 Minutes. This time reflects time of care of this signee: Merrie Roof, MD FACP. This critical care time does not reflect procedure time, or teaching time or supervisory time of PA/NP/Med student/Med Resident etc but could involve care discussion time. Rest per NP/medical resident whose note is outlined above and that I agree with   Lavon Paganini. Titus Mould, MD, [place] Pgr: [facility] 04/14/2017 9:42 AM

## 2017-04-14 NOTE — Evaluation (Signed)
Passy-Muir Speaking Valve - Evaluation Patient Details  Name: Joe Wilkinson MRN: [id number] Date of Birth: [birth year]  Today's Date: 04/14/2017 Time: 0933-1010 SLP Time Calculation (min) (ACUTE ONLY): 37 min  Past Medical History: History reviewed. No pertinent past medical history. Past Surgical History:  Past Surgical History:  Procedure Laterality Date  . CRANIECTOMY Right 04/01/2017   Procedure: RIGHT DECOMPRESSIVE CRANIECTOMY;  Surgeon: Ditty, Kevan Ny, MD;  Location: [place];  Service: Neurosurgery;  Laterality: Right;  . ESOPHAGOGASTRODUODENOSCOPY N/A 04/13/2017   Procedure: ESOPHAGOGASTRODUODENOSCOPY (EGD);  Surgeon: Georganna Skeans, MD;  Location: [facility];  Service: General;  Laterality: N/A;  bedside  . PEG PLACEMENT N/A 04/13/2017   Procedure: PERCUTANEOUS ENDOSCOPIC GASTROSTOMY (PEG) PLACEMENT;  Surgeon: Georganna Skeans, MD;  Location: [facility] ENDOSCOPY;  Service: General;  Laterality: N/A;   HPI:  56 yo male brought to [facility] with altered mental status from DKA and sepsis. 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 Required Rt decompressive craniectomy and remained on vent post op.ETT 8/23-9/5, s/p tracheostomy 9/5.    Assessment / Plan / Recommendation Clinical Impression  Pt demosntrates promising use of PMSV though suspect mild trapping of air below cuff given intermittent sensation of backpressure when removed after 10 minute intervals. Suspect size of trach as well as poor head/neck support, possible flaccid pharyngeal/laryngeal tissue, upper airway secretions and shallow respiratory effort all contribute. When given max verbal cues pt was able to increase adequate breath support to blow his nose with physical assist. Breath support for phonation was otherwise minimal and max visual and verbal cues needed for aubible verbalization and overarticualtion when counting to three. Recommend pt wear PMSV with full RN/RT/MD supervision for brief intervals. Will continue efforts at  functional communication.  SLP Visit Diagnosis: Aphonia (R49.1)    SLP Assessment  Patient needs continued Speech Lanaguage Pathology Services    Follow Up Recommendations  Inpatient Rehab    Frequency and Duration min 2x/week  2 weeks    PMSV Trial PMSV was placed for: 10 minutes intervals Able to redirect subglottic air through upper airway: Yes Able to Attain Phonation: Yes Voice Quality: Hoarse;Low vocal intensity;Breathy Able to Expectorate Secretions: Yes Level of Secretion Expectoration with PMSV: Oral (nose - blew his nose with assist several times) Breath Support for Phonation: Severely decreased Intelligibility: Intelligibility reduced Word: 50-74% accurate Phrase: 0-24% accurate Sentence: 0-24% accurate Conversation: 0-24% accurate Respirations During Trial: 20 SpO2 During Trial: 98 %   Tracheostomy Tube  Additional Tracheostomy Tube Assessment Fenestrated: No Trach Collar Period: since yesterday pm? Secretion Description: copious clear upper airway secretions Level of Secretion Expectoration: Tracheal;Oral    Vent Dependency  Vent Dependent:  (tenuous) FiO2 (%): 40 %    Cuff Deflation Trial  GO Tolerated Cuff Deflation: Yes Length of Time for Cuff Deflation Trial: 35 minutes Behavior: Alert;Cooperative        Darey Hershberger, Katherene Ponto 04/14/2017, 2:30 PM

## 2017-04-14 NOTE — Progress Notes (Signed)
STROKE TEAM PROGRESS NOTE   SUBJECTIVE (INTERVAL HISTORY)  . No neuro changes. Hb remains stable at 25.6    He had PEG yesterday and is tolerating it well. On trach collar since this am OBJECTIVE Temp:  [98.1 F (36.7 C)-100.2 F (37.9 C)] 100.2 F (37.9 C) (09/07 1600) Pulse Rate:  [91-112] 112 (09/07 1803) Cardiac Rhythm: Normal sinus rhythm (09/07 0800) Resp:  [18-32] 26 (09/07 1803) BP: (110-140)/(69-88) 136/82 (09/07 1803) SpO2:  [93 %-100 %] 94 % (09/07 1803) FiO2 (%):  [40 %] 40 % (09/07 1609) Weight:  [215 lb 2.7 oz (97.6 kg)] 215 lb 2.7 oz (97.6 kg) (09/07 0432) Examination:  . Afebrile. Head is nontraumatic. Neck is supple without bruit.    Cardiac exam no murmur or gallop. Lungs are clear to auscultation. Distal pulses are well felt.   Lungs : CTA Heart: RRR Abdomen: soft NT Neurological Exam :  S/p tracheostomy, drowsy and barely eyes open on voice. Follows simple commands on the right UE and LE. Right gaze preference, not able to cross midline to the left. Pupils 3 mm reactive to light. Fundi could not be visualized. Corneal, gag and cough are present. Patient is able to move right upper and lower extremity spontaneously and against gravity with brisk withdrawal response to pain. Dense left hemiplegia with decreased tone. Reflexes present on the right and absent on the left. Left plantar upgoing right equivocal.   CBC:   Recent Labs Lab 04/13/17 0239 04/14/17 0451  WBC 12.9* 13.1*  HGB 7.2* 7.7*  HCT 24.7* 25.6*  MCV 99.2 98.1  PLT 278 967    Basic Metabolic Panel:   Recent Labs Lab 04/11/17 0219  04/12/17 0151  04/13/17 0239 04/14/17 0451  NA 143  < > 139  < > 137 137  K 3.9  --  3.8  --  4.1 3.7  CL 111  --  106  --  103 102  CO2 27  --  26  --  28 28  GLUCOSE 209*  --  147*  --  125* 192*  BUN 12  --  15  --  16 15  CREATININE 0.82  --  0.75  --  0.67 0.79  CALCIUM 8.7*  --  8.5*  --  8.7* 9.0  MG 2.1  --  2.2  --   --   --   PHOS 3.6  --  4.4   --   --   --   < > = values in this interval not displayed.  Lipid Panel:     Component Value Date/Time   CHOL 93 04/02/2017 0607   TRIG 135 04/02/2017 0607   HDL 28 (L) 04/02/2017 0607   CHOLHDL 3.3 04/02/2017 0607   VLDL 27 04/02/2017 0607   LDLCALC 38 04/02/2017 0607   HgbA1c:  Lab Results  Component Value Date   HGBA1C 14.3 (H) 04/02/2017   Urine Drug Screen: No results found for: LABOPIA, COCAINSCRNUR, LABBENZ, AMPHETMU, THCU, LABBARB  Alcohol Level No results found for: ETH  IMAGING   Ct Head Wo Contrast 03/31/2017 1. Progressive large right MCA territory infarct with involvement of the right occipital lobe likely related to a fetal type right posterior cerebral artery.  2. Diffuse edema and mass effect with 12 mm midline shift and partial effacement of the para mesencephalic cisterns.  3. Developing left posterior watershed territory infarct.  4. Nasal fluid and bilateral sinus disease likely related to intubation.   Ct Head Wo  Contrast 03/30/2017 No acute intracranial abnormality identified. Unremarkable CT of the brain.   Ct Angio Head and neck W Or Wo Contrast 04/02/2017 IMPRESSION:  1. Occlusion of the right middle cerebral artery proximal M1 segment with extensive cytotoxic edema throughout the entire right MCA distribution.  2. Status post right pterional craniectomy for decompression with unchanged leftward midline shift measuring 8 mm.  3. No hemorrhage.  4. Normal carotid arteries.  No embolic source identified.   LE venous doppler - No evidence of deep vein thrombosis or baker's cysts bilaterally.  EEG - This EEG is abnormal due to bilateral slowing with suppression of the right hemisphere. Clinical Correlation of the above findings indicates bilateral cerebral dysfunction that is non-specific in etiology and can be seen with hypoxic/ischemic injury, toxic/metabolic encephalopathies, neurodegenerative disorders, or medication effect.  The absence of  epileptiform discharges does not rule out a clinical diagnosis of epilepsy.  Clinical correlation is advised.  TTE Left ventricle: The cavity size was normal. There was severe   concentric hypertrophy. Systolic function was vigorous. The   estimated ejection fraction was in the range of 65% to 70%. Wall   motion was normal; there were no regional wall motion   abnormalities. Left ventricular diastolic function parameters   were normal. - Atrial septum: There was increased thickness of the septum,   consistent with lipomatous hypertrophy. - Pulmonary arteries: Systolic pressure could not be accurately   estimated.  Ct Head Wo Contrast 04/05/2017 IMPRESSION:  1. Evolving acute extensive RIGHT cerebrum infarcts including RIGHT MCA, to lesser extent RIGHT ACA and RIGHT PCA territories. Petechial hemorrhage without hemorrhagic conversion.  2. Similar 7 mm RIGHT to LEFT midline shift without ventricular entrapment. Similar external herniation via RIGHT craniectomy defect.  3. Similar acute small LEFT parietal lobe infarct.   Ct Abdomen Pelvis Wo Contrast 04/06/2017 IMPRESSION:  No evidence of retroperitoneal hematoma. Diffuse mesenteric and retroperitoneal soft tissue stranding and body wall edema, consistent with third spacing. No evidence of focal inflammatory process or abscess. Tiny left pleural effusion and bibasilar atelectasis noted. Indeterminate 3.5 cm low-attenuation lesion in left hepatic lobe. Consider further characterization with hepatic protocol abdomen CT with contrast (or MRI if patient can cooperate with breath-holding).    PHYSICAL EXAM   Vitals:   04/14/17 1600 04/14/17 1609 04/14/17 1700 04/14/17 1803  BP: 130/74  123/69 136/82  Pulse: (!) 109 (!) 106 (!) 102 (!) 112  Resp: (!) 24 (!) 28 (!) 24 (!) 26  Temp: 100.2 F (37.9 C)     TempSrc: Axillary     SpO2: 98% 96% 96% 94%  Weight:      Height:       56 African-American male who is sedated and intubated.  Craniotomy bandage on the right skull. Afebrile. Head is nontraumatic. Cardiac exam no murmur or gallop. Distal pulses are well felt. Neurological Exam :  Status post tracheostomy, eyes open on voice. Follows simple commands on the right UE and LE. Right gaze preference, not able to cross midline to the left. Pupils 3 mm reactive to light. Fundi could not be visualized. Corneal, gag and cough are present. Patient is able to move right upper and lower extremity spontaneously and against gravity with brisk withdrawal response to pain. Dense left hemiplegia with decreased tone. Reflexes present on the right and absent on the left. Left plantar upgoing right equivocal. Sensation, coordination and gait not tested.   ASSESSMENT/PLAN Joe Wilkinson is a 56 y.o. male with history of diabetes  mellitus (other past medical history unknown at this time) presenting with unresponsiveness. He did not receive IV t-PA due to decompressive right frontotemporoparietal craniectomy for decompression.  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 S/p right decompressive hemicraniectomy.   Resultant  left hemiplegia and right gaze   CT head - Progressive large right MCA territory infarct with involvement of the right occipital lobe likely related to a fetal type right PCA. Diffuse edema and mass effect. Developing left posterior watershed territory infarct.   CTA head and neck - right M1 cut off, b/l fetal PCAs with dimunitive BA and VAs  MRI head - consider once stabilized  2D Echo - EF 65-70%  EEG no seizure  LDL - 38   HgbA1c - 14.3  VTE prophylaxis - SCDs (not on lovenox due to critical anemia needed repeated PRBC) Diet NPO time specified  No antithrombotic prior to admission, now on ASA 325mg   Patient will be counseled to be compliant with his antithrombotic medications  Ongoing aggressive stroke risk factor management  Therapy  recommendations:  pending  Disposition:  Pending  Cerebral edema  CT head large right MCA infarct with diffuse edema and midline shift  Status post decompressive hemicraniectomy  Neurosurgery on board  Repeat CT evolving right malignant MCA, stable   Sodium 158->155->158->154->150    on 3% saline plan to taper and stop over next 2 days  Sodium goal of 150-160, continue monitoring Q6h  PAF - could be potential source of current stroke  Confirmed with EKG  HR can go up to 140-150s  Reversed by amiodarone  Now off amiodarone  CCM on board  Acute blood loss anemia - no source found so far  Source unclear, could be due to surgery  Hb 12.6 - 10.9 - 8.2 - 7.0 - 6.2 - PRBC - 7.5 -8.1-> 6.6->PRBC->7.2->6.7-> PRBC->7.8->7.0->6.6 - PRBC  S/p repeated PRBC transfusion  CT abdomen - no retroperitoneal hematoma  No bloody or dark bowel movement  Stool occult blood - pending ( ordered 04/06/2017 - spoke with nursing today to reinforce need)  CBC monitoring - H/H 7.8/25.8 Thursday - recheck in AM  Diabetes with DKA  HgbA1c 14.3, goal < 7.0  Uncontrolled  Off insulin drip, on Levemir  Still significant hyperglycemia  DM nurse on board, appreciate help  CCM on board  Leukocytosis,? Sepsis  WBC 18.8 -> 26.2->25.5->24.0->20.1->17.3  Off Abx  off Neo now  CCM on board  Hypertension  Stable off Neo for hypotension BP goal 120-160 Long-term BP goal normotensive  Other Stroke Risk Factors  Obesity, Body mass index is 30.87 kg/m., recommend weight loss, diet and exercise as appropriate   Other Active Problems  Induced hypernatremia  Thrombocytopenia - resolved  TF @ 75  Cortrak Tube - placed   04/08/2017  Hospital day # 13 Plan the patient  will get  PEG today  . No family available at bedside for discussion.D/w Dr Titus Mould This patient is critically ill due to right malignant MCA infarct status post hemicraniectomy, embolic infarcts to left MCA,  PAF, diabetes and DKA, sepsis, leukocytosis, question procedure and at significant risk of neurological worsening, death form cerebral edema, ankle herniation, heart failure, status epilepticus, septic shock, DKA, hemorrhagic transformation. This patient's care requires constant monitoring of vital signs, hemodynamics, respiratory and cardiac monitoring, review of multiple databases, neurological assessment, discussion with family, other specialists and medical decision making of high complexity. I spent 30 minutes  of neurocritical care time in the care of this patient. Anticipate transfer to SNF next week.Stroke team will sign off. Call for questions. Antony Contras, MD Medical Director [place] Pager: [phone] 04/14/2017 7:11 PM    To contact Stroke Continuity provider, please refer to [email or web address] After hours, contact General Neurology

## 2017-04-14 NOTE — Progress Notes (Signed)
  Speech Language Pathology Treatment: Dysphagia  Patient Details Name: Tivon Lemoine MRN: [id number] DOB: May 03, 1961 Today's Date: 04/14/2017 Time: 8546-2703 SLP Time Calculation (min) (ACUTE ONLY): 37 min  Assessment / Plan / Recommendation Clinical Impression  Provided trials of ice chips with PMSV in place to determine presence of swallow response and pt ability to manipulate PO in setting of significant oral neuromuscular impairment on the left. Pt eager to attempt ice chip, but needed total assist to move the ice chip past the teeth and pts movement of mandibile quite limited, possibly due to positioning as well as weakness. Max verbal cues also given for labial closure to improve manipulation with minimal success. Despite severe anterior spillage pt sis initiate a swallow response x2 followed by delayed cough. Pt is not ready for objective assessment of swallow function at this time. Will need to see improved oral control and stability of respiratory function prior to testing. Will follow for trials.   HPI HPI: 56 yo male brought to [facility] with altered mental status from DKA and sepsis. 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. Required Rt decompressive craniectomy and remained on vent post op.ETT 8/23-9/5, s/p tracheostomy 9/5.       SLP Plan  Continue with current plan of care  Patient needs continued Speech Lanaguage Pathology Services    Recommendations  Diet recommendations: NPO      Patient may use Passy-Muir Speech Valve: Intermittently with supervision PMSV Supervision: Full MD: Please consider changing trach tube to : Smaller size;Cuffless         General recommendations: Rehab consult Oral Care Recommendations: Oral care QID Follow up Recommendations: Inpatient Rehab SLP Visit Diagnosis: Aphonia (R49.1) Plan: Continue with current plan of care       GO               [facility], [place] CCC-SLP [phone]  Lynann Beaver 04/14/2017, 2:34 PM

## 2017-04-14 NOTE — Progress Notes (Signed)
Orthopedic Tech Progress Note Patient Details:  Joe Wilkinson 05-02-1961 [id number]  Ortho Devices Type of Ortho Device: Abdominal binder Ortho Device/Splint Location: abdomen Ortho Device/Splint Interventions: Loanne Drilling, Yasuko Lapage 04/14/2017, 11:09 AM

## 2017-04-14 NOTE — Progress Notes (Signed)
Patient ID: Joe Wilkinson, male   DOB: 1960/10/20, 56 y.o.   MRN: [id number] On HTC Abd soft, NT, PEG in place I asked RN to get a larger binder - keep binder at all times loosely to protect PEG. Please re-call us PRN.  Georganna Skeans, MD, MPH, FACS Trauma: [phone] General Surgery: [phone]

## 2017-04-15 ENCOUNTER — Inpatient Hospital Stay (HOSPITAL_COMMUNITY): Payer: Medicaid Other

## 2017-04-15 LAB — BASIC METABOLIC PANEL
ANION GAP: 9 (ref 5–15)
BUN: 16 mg/dL (ref 6–20)
CALCIUM: 9.3 mg/dL (ref 8.9–10.3)
CO2: 27 mmol/L (ref 22–32)
CREATININE: 0.9 mg/dL (ref 0.61–1.24)
Chloride: 101 mmol/L (ref 101–111)
Glucose, Bld: 191 mg/dL — ABNORMAL HIGH (ref 65–99)
Potassium: 4.4 mmol/L (ref 3.5–5.1)
SODIUM: 137 mmol/L (ref 135–145)

## 2017-04-15 LAB — CBC
HCT: 26 % — ABNORMAL LOW (ref 39.0–52.0)
HEMOGLOBIN: 7.7 g/dL — AB (ref 13.0–17.0)
MCH: 29.2 pg (ref 26.0–34.0)
MCHC: 29.6 g/dL — ABNORMAL LOW (ref 30.0–36.0)
MCV: 98.5 fL (ref 78.0–100.0)
PLATELETS: 387 10*3/uL (ref 150–400)
RBC: 2.64 MIL/uL — AB (ref 4.22–5.81)
RDW: 19.2 % — ABNORMAL HIGH (ref 11.5–15.5)
WBC: 13.9 10*3/uL — AB (ref 4.0–10.5)

## 2017-04-15 LAB — GLUCOSE, CAPILLARY
GLUCOSE-CAPILLARY: 216 mg/dL — AB (ref 65–99)
GLUCOSE-CAPILLARY: 175 mg/dL — AB (ref 65–99)
GLUCOSE-CAPILLARY: 163 mg/dL — AB (ref 65–99)
Glucose-Capillary: 154 mg/dL — ABNORMAL HIGH (ref 65–99)
Glucose-Capillary: 156 mg/dL — ABNORMAL HIGH (ref 65–99)
Glucose-Capillary: 228 mg/dL — ABNORMAL HIGH (ref 65–99)
Glucose-Capillary: 150 mg/dL — ABNORMAL HIGH (ref 65–99)

## 2017-04-15 MED ORDER — WHITE PETROLATUM GEL
Status: AC
  Administered 2017-04-16: 1
  Filled 2017-04-15: qty 1

## 2017-04-15 NOTE — Progress Notes (Addendum)
PROGRESS NOTE  Joe Wilkinson  TGP:[id number] DOB: 09-08-1960 DOA: 03/31/2017 PCP: Patient, No Pcp Per   Brief Narrative: Joe Wilkinson is a 56 y.o. male with a history of DM who was [place] unresponsive [place] bus stop, taken to [facility] found to be in DKA with sepsis from uncertain source. He developed kussmaul breathing requiring intubation and hemiparesis and found to have massive right MCA and PCA territory infacrts with mass effect prompting transfer to [facility] under ICU care. He underwent decompressive right craniectomy 8/25. Hypertonic saline was started per neurology recommendations. He required transfusion support and was treated for AFib with RVR, continued on the ventilator, eventually receiving trach 9/5 and PEG 9/6, on ATC 9/7.   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 agalactiae PNA, then restarted on zosyn 9/1 due to fever, concern for aspiration.  - Continue trach, prn vent - Keep negative balance with lasix - Complete course of zosyn 9/1 - 9/8 with negative respiratory cultures. Leukocytosis improving.   Massive right MCA territory infarct with PCA watershed infarct, subsequent cytotoxic edema requiring decompressive craniectomy:  - ASA per neurology - Now trach/PEG-dependent. Tube feeding at goal.  - Tapered hypertonic saline per neurology. Monitor BMP.  - Sutures D/C'ed, plan to follow up with neurosurgery in 3 months to assess for cranioplasty.  AFib with RVR: Currently NSR.  - Continue cardiac monitoring, no anticoagulation, just ASA - metoprolol IV prn  Acute anemia: CT abdomen negative, no gross GI bleeding. Required transfusions x4.  - FOBT - PPI for SUP q12h for ?stress gastritis.  - Monitor CBC  daily, transfuse to maintain hgb >7 or w/bleeding  IDDM:  - TF with glucerna - Continue detemir 24u daily, SSI. At inpatient goal  Septic shock due to S. agalactiae pneumonia: Resolved, completed abx 8/30.   DVT prophylaxis: Aspirin Code Status: Full Family Communication: None at bedside Disposition Plan: Uncertain  Consultants:   PCCM  Neurology  Neurosurgery  Procedures:  LP 8/24 >> glucose 205, RBC 16, protein 62, WBC 5 CT head 8/24 >> Large Rt MCA EEG 8/27 >> bilateral cerebral dysfunction, non-specific in etiology Echo 8/27 >> EF 65 to 70% CT abd/pelvis 8/30 >> third spacing, 3.5 cm low attenuation Lt hepatic lobe CT Head 9/3>> Severe right hemisphere cytotoxic edema with stable extension of edematous brain through the right craniectomy defect. Stable petechial hemorrhage without malignant hemorrhagic transformation. Stable intracranial mass effect with leftward midline shift of 7 mm. Small left PCA territory infarct.  Cultures/antimicrobials: CSF 8/24>> negative BCX 8/24>> negative UCX 8/24>> negative Sputum 8/24>> S. Agalactiae Sputum 9/1>> Consistent with normal resp. flora  Vancomycin 8/24 >> 8/26 Zosyn 8/24 >> 8/26, 9/1 >> Ampicillin 8/27 > 8/30  Subjective: Drowsy, poorly responsive. No acute events noted.   Objective: Vitals:   04/15/17 0751 04/15/17 0805 04/15/17 1111 04/15/17 1204  BP: 120/67 120/70 132/81 113/66  Pulse: (!) 103 (!) 107 100 98  Resp: (!) 33 (!) 32 (!) 34 (!) 27  Temp: 99.5 F (37.5 C)  98.7 F (37.1 C)   TempSrc: Axillary  Oral   SpO2: 100% 100% 100% 100%  Weight:      Height:        Intake/Output Summary (Last 24 hours) at 04/15/17  [place] filed at 04/15/17 1128  Gross per 24 hour  Intake             1295 ml  Output             1900 ml  Net             -605 ml   Filed Weights   04/13/17 0500 04/14/17 0432 04/15/17 0438  Weight: 97.5 kg (214 lb 15.2 oz) 97.6 kg (215 lb 2.7 oz) 96.7 kg (213 lb 3 oz)    Gen: 56  y.o. male in no distress, poorly responsive, does not reliably open eyes to voice. Did not follow commands this morning.  Pulm: Non-labored with O2 thru trach. Scant bibasilar crackles laterally.  CV: Regular rate and rhythm. No murmur, rub, or gallop. No JVD, trace pedal edema. GI: Abdomen soft, non-tender, non-distended, with normoactive bowel sounds. No organomegaly or masses felt. PEG tube site c/d/i without erythema Ext: Warm, no deformities Skin: No rashes, lesions no ulcers Neuro: Drowsy, no movement on left, minimal but spontaneous movement on right. PERRL. Cough reflex intact.  Psych: Judgement and insight appear normal. Mood & affect appropriate.   Data Reviewed: I have personally reviewed following labs and imaging studies  CBC:  Recent Labs Lab 04/11/17 0219 04/12/17 0151 04/13/17 0239 04/14/17 0451 04/15/17 0245  WBC 16.8* 15.1* 12.9* 13.1* 13.9*  HGB 7.7* 7.1* 7.2* 7.7* 7.7*  HCT 25.9* 24.2* 24.7* 25.6* 26.0*  MCV 98.1 98.4 99.2 98.1 98.5  PLT 251 251 278 337 938   Basic Metabolic Panel:  Recent Labs Lab 04/09/17 0407  04/11/17 0219  04/12/17 0151 04/12/17 0833 04/13/17 0239 04/14/17 0451 04/15/17 0245  NA 148*  < > 143  < > 139 138 137 137 137  K 4.1  < > 3.9  --  3.8  --  4.1 3.7 4.4  CL 115*  < > 111  --  106  --  103 102 101  CO2 28  < > 27  --  26  --  _0 GLUCOSE 251*  < > 209*  --  147*  --  125* 192* 191*  BUN 18  < > 12  --  15  --  _1 CREATININE 0.86  < > 0.82  --  0.75  --  0.67 0.79 0.90  CALCIUM 8.3*  < > 8.7*  --  8.5*  --  8.7* 9.0 9.3  MG 2.4  --  2.1  --  2.2  --   --   --   --   PHOS 3.7  --  3.6  --  4.4  --   --   --   --   < > = values in this interval not displayed. GFR: Estimated Creatinine Clearance: 106.9 mL/min (by C-G formula based on SCr of 0.9 mg/dL). Liver Function Tests: No results for input(s): AST, ALT, ALKPHOS, BILITOT, PROT, ALBUMIN in the last 168 hours. No results for input(s): LIPASE, AMYLASE in the  last 168 hours. No results for input(s): AMMONIA in the last 168 hours. Coagulation Profile:  Recent Labs Lab 04/09/17 1100 04/12/17 1233  INR 1.15 1.08   Cardiac Enzymes: No results for input(s): CKTOTAL, CKMB, CKMBINDEX, TROPONINI in the last 168 hours. BNP (last 3 results) No results for input(s): PROBNP in the last 8760 hours. HbA1C: No results for input(s): HGBA1C in the last 72 hours. CBG:  Recent Labs Lab 04/15/17 0113 04/15/17  0320 04/15/17 0549 04/15/17 0750 04/15/17 1107  GLUCAP 216* 175* 154* 163* 156*   Lipid Profile: No results for input(s): CHOL, HDL, LDLCALC, TRIG, CHOLHDL, LDLDIRECT in the last 72 hours. Thyroid Function Tests: No results for input(s): TSH, T4TOTAL, FREET4, T3FREE, THYROIDAB in the last 72 hours. Anemia Panel: No results for input(s): VITAMINB12, FOLATE, FERRITIN, TIBC, IRON, RETICCTPCT in the last 72 hours. Urine analysis:    Component Value Date/Time   COLORURINE YELLOW (A) 03/31/2017 0945   APPEARANCEUR CLOUDY (A) 03/31/2017 0945   LABSPEC 1.018 03/31/2017 0945   PHURINE 5.0 03/31/2017 0945   GLUCOSEU 50 (A) 03/31/2017 0945   HGBUR MODERATE (A) 03/31/2017 0945   BILIRUBINUR NEGATIVE 03/31/2017 0945   KETONESUR NEGATIVE 03/31/2017 0945   PROTEINUR 30 (A) 03/31/2017 0945   NITRITE NEGATIVE 03/31/2017 0945   LEUKOCYTESUR NEGATIVE 03/31/2017 0945   Recent Results (from the past 240 hour(s))  Culture, respiratory (NON-Expectorated)     Status: None   Collection Time: 04/08/17  1:36 AM  Result Value Ref Range Status   Specimen Description ENDOTRACHEAL  Final   Special Requests NONE  Final   Gram Stain   Final    FEW WBC PRESENT, PREDOMINANTLY PMN RARE SQUAMOUS EPITHELIAL CELLS PRESENT FEW GRAM POSITIVE COCCI    Culture Consistent with normal respiratory flora.  Final   Report Status 04/10/2017 FINAL  Final      Radiology Studies: Dg Chest Port 1 View  Result Date: 04/15/2017 CLINICAL DATA:  Respiratory failure. EXAM:  PORTABLE CHEST 1 VIEW COMPARISON:  Radiograph April 12, 2017. FINDINGS: Stable cardiomediastinal silhouette and central pulmonary vascular congestion. Endotracheal tube has been removed and replaced with tracheostomy tube which is in grossly good position. Mild bibasilar subsegmental atelectasis or edema is noted. Bony thorax is unremarkable. No pneumothorax is noted. IMPRESSION: Stable central pulmonary vascular congestion with mild bibasilar subsegmental atelectasis or edema. Tracheostomy tube is in grossly good position. Electronically Signed   By: Marijo Conception, M.D.   On: 04/15/2017 08:36    Scheduled Meds: . aspirin  325 mg Per Tube Daily  . chlorhexidine gluconate (MEDLINE KIT)  15 mL Mouth Rinse BID  . feeding supplement (PRO-STAT SUGAR FREE 64)  30 mL Per Tube BID  . Gerhardt's butt cream   Topical BID  . insulin aspart  0-20 Units Subcutaneous Q4H  . insulin aspart  3 Units Subcutaneous Q4H  . insulin detemir  34 Units Subcutaneous Daily  . mouth rinse  15 mL Mouth Rinse 10 times per day  . pantoprazole (PROTONIX) IV  40 mg Intravenous Q12H   Continuous Infusions: . feeding supplement (GLUCERNA 1.2 CAL) 1,000 mL (04/15/17 0315)  . piperacillin-tazobactam (ZOSYN)  IV 3.375 g (04/15/17 0657)     LOS: 14 days   Time spent: 35 minutes.  Vance Gather, MD [facility] Pager [phone]  If 7PM-7AM, please contact night-coverage [email or web address] Password [email or web address] 04/15/2017, 1:59 PM

## 2017-04-16 LAB — CBC
HEMATOCRIT: 24.7 % — AB (ref 39.0–52.0)
Hemoglobin: 7.5 g/dL — ABNORMAL LOW (ref 13.0–17.0)
MCH: 29.8 pg (ref 26.0–34.0)
MCHC: 30.4 g/dL (ref 30.0–36.0)
MCV: 98 fL (ref 78.0–100.0)
Platelets: 435 10*3/uL — ABNORMAL HIGH (ref 150–400)
RBC: 2.52 MIL/uL — AB (ref 4.22–5.81)
RDW: 18.6 % — ABNORMAL HIGH (ref 11.5–15.5)
WBC: 12.1 10*3/uL — AB (ref 4.0–10.5)

## 2017-04-16 LAB — GLUCOSE, CAPILLARY
GLUCOSE-CAPILLARY: 146 mg/dL — AB (ref 65–99)
GLUCOSE-CAPILLARY: 166 mg/dL — AB (ref 65–99)
Glucose-Capillary: 168 mg/dL — ABNORMAL HIGH (ref 65–99)
Glucose-Capillary: 191 mg/dL — ABNORMAL HIGH (ref 65–99)
Glucose-Capillary: 144 mg/dL — ABNORMAL HIGH (ref 65–99)
Glucose-Capillary: 172 mg/dL — ABNORMAL HIGH (ref 65–99)
Glucose-Capillary: 123 mg/dL — ABNORMAL HIGH (ref 65–99)

## 2017-04-16 MED ORDER — ORAL CARE MOUTH RINSE
15.0000 mL | Freq: Two times a day (BID) | OROMUCOSAL | Status: DC
  Administered 2017-04-16 – 2017-04-29 (×26): 15 mL via OROMUCOSAL

## 2017-04-16 MED ORDER — PANTOPRAZOLE SODIUM 40 MG IV SOLR
40.0000 mg | INTRAVENOUS | Status: DC
  Administered 2017-04-17 – 2017-04-23 (×7): 40 mg via INTRAVENOUS
  Filled 2017-04-16 (×8): qty 40

## 2017-04-16 MED ORDER — CHLORHEXIDINE GLUCONATE 0.12 % MT SOLN
15.0000 mL | Freq: Two times a day (BID) | OROMUCOSAL | Status: DC
  Administered 2017-04-16 – 2017-05-29 (×84): 15 mL via OROMUCOSAL
  Filled 2017-04-16 (×81): qty 15

## 2017-04-16 NOTE — Progress Notes (Signed)
PROGRESS NOTE  Joe Wilkinson  PJK:[id number] DOB: Dec 21, 1960 DOA: 03/31/2017 PCP: Patient, No Pcp Per   Brief Narrative: Joe Wilkinson is a 56 y.o. male with a history of DM who was [place] un[place] bus stop, taken to [facility] found to be in DKA with sepsis from uncertain source. He developed kussmaul breathing requiring intubation and hemiparesis and found to have massive right MCA and PCA territory infacrts with mass effect prompting transfer to [facility] under ICU care. He underwent decompressive right craniectomy 8/25. Hypertonic saline was started per neurology recommendations. He required transfusion support and was treated for AFib with RVR, continued on the ventilator, eventually receiving trach 9/5 and PEG 9/6, on ATC 9/7.   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 agalactiae PNA, then restarted on zosyn 9/1 - 9/8 due to fever, concern for aspiration.  - Continue trach, prn vent - Keep negative balance with lasix. Weight today likely spurious. - Completed course of zosyn 9/1 - 9/8 with negative respiratory cultures. Leukocytosis improving.   Massive right MCA territory infarct with PCA watershed infarct, subsequent cytotoxic edema requiring decompressive craniectomy:  - ASA per neurology - Now trach/PEG-dependent. Tube feeding at goal.  - Tapered hypertonic saline per neurology. Monitor BMP.  - Sutures D/C'ed, plan to follow up with neurosurgery in 3 months to assess for cranioplasty.  AFib with RVR: Currently NSR.  - Continue cardiac monitoring, no anticoagulation, just ASA - metoprolol IV prn  Acute anemia: CT abdomen negative, no gross GI bleeding. EGD at time of PEG placement showed normal esophagus,  stomach, duodenal bulb. Required transfusions x4.  - FOBT pending, LBM 9/8, but no sample collected. - PPI for SUP daily. - Monitor CBC daily, transfuse to maintain hgb >7 or w/bleeding  IDDM: At inpatient goal - TF with glucerna - Continue detemir 34u daily, mealtime 3u + res SSI q4h.  Septic shock due to S. agalactiae pneumonia: Resolved, completed abx 8/30.   DVT prophylaxis: Aspirin Code Status: Full Family Communication: None at bedside Disposition Plan: Will have LTAC evaluate.  Consultants:   PCCM  Neurology  Neurosurgery  Procedures:  LP 8/24 >> glucose 205, RBC 16, protein 62, WBC 5 CT head 8/24 >> Large Rt MCA EEG 8/27 >> bilateral cerebral dysfunction, non-specific in etiology Echo 8/27 >> EF 65 to 70% CT abd/pelvis 8/30 >> third spacing, 3.5 cm low attenuation Lt hepatic lobe CT Head 9/3>> Severe right hemisphere cytotoxic edema with stable extension of edematous brain through the right craniectomy defect. Stable petechial hemorrhage without malignant hemorrhagic transformation. Stable intracranial mass effect with leftward midline shift of 7 mm. Small left PCA territory infarct.  Cultures/antimicrobials: CSF 8/24: negative BCX 8/24: negative UCX 8/24: negative Sputum 8/24: S. Agalactiae Sputum 9/1:ormal resp. flora  Vancomycin 8/24 - 8/26 Zosyn 8/24 - 8/26, 9/1 - 9/8 Ampicillin 8/27 - 8/30  Subjective: No events reported overnight, not meaningfully responsive.  Objective: Vitals:   04/16/17 0900 04/16/17 1120 04/16/17 1123 04/16/17 1154  BP: 122/80 115/65    Pulse: (!) 105 94    Resp: (!) 31 (!) 24 (!) 23   Temp:    98.3 F (36.8 C)  TempSrc:    Axillary  SpO2: 100% 100%    Weight:  Height:        Intake/Output Summary (Last 24 hours) at 04/16/17 1221 Last data filed at 04/16/17 1153  Gross per 24 hour  Intake             1625 ml  Output             2325 ml  Net             -700 ml   Filed Weights   04/14/17 0432 04/15/17 0438  04/16/17 0323  Weight: 97.6 kg (215 lb 2.7 oz) 96.7 kg (213 lb 3 oz) 90.4 kg (199 lb 4.7 oz)   Gen: 56 y.o. male in no distress.  Pulm: Non-labored with O2 thru trach. Scant bibasilar crackles laterally.  CV: Regular rate and rhythm. No murmur, rub, or gallop. No JVD, trace pedal edema. GI: Abdomen soft, non-tender, non-distended, with normoactive bowel sounds. No organomegaly or masses felt. PEG tube site in LUQ c/d/i without erythema Ext: Warm, no deformities Skin: No rashes, lesions no ulcers Neuro: Drowsy, no movement on left, minimal but spontaneous movement on right, will move right hand but not right foot to command. Opens eyes to touch, not to voice. Cough reflex intact.  Psych: Judgement and insight appear normal. Mood & affect appropriate.   CBC:  Recent Labs Lab 04/12/17 0151 04/13/17 0239 04/14/17 0451 04/15/17 0245 04/16/17 0222  WBC 15.1* 12.9* 13.1* 13.9* 12.1*  HGB 7.1* 7.2* 7.7* 7.7* 7.5*  HCT 24.2* 24.7* 25.6* 26.0* 24.7*  MCV 98.4 99.2 98.1 98.5 98.0  PLT 251 278 337 387 254*   Basic Metabolic Panel:  Recent Labs Lab 04/11/17 0219  04/12/17 0151 04/12/17 0833 04/13/17 0239 04/14/17 0451 04/15/17 0245  NA 143  < > 139 138 137 137 137  K 3.9  --  3.8  --  4.1 3.7 4.4  CL 111  --  106  --  103 102 101  CO2 27  --  26  --  28 28 27   GLUCOSE 209*  --  147*  --  125* 192* 191*  BUN 12  --  15  --  16 15 16   CREATININE 0.82  --  0.75  --  0.67 0.79 0.90  CALCIUM 8.7*  --  8.5*  --  8.7* 9.0 9.3  MG 2.1  --  2.2  --   --   --   --   PHOS 3.6  --  4.4  --   --   --   --   < > = values in this interval not displayed. GFR: Estimated Creatinine Clearance: 103.7 mL/min (by C-G formula based on SCr of 0.9 mg/dL).  CBG:  Recent Labs Lab 04/15/17 1615 04/15/17 1920 04/15/17 2344 04/16/17 0331 04/16/17 0938  GLUCAP 228* 150* 168* 146* 191*   Urine analysis:    Component Value Date/Time   COLORURINE YELLOW (A) 03/31/2017 0945   APPEARANCEUR CLOUDY  (A) 03/31/2017 0945   LABSPEC 1.018 03/31/2017 0945   PHURINE 5.0 03/31/2017 0945   GLUCOSEU 50 (A) 03/31/2017 0945   HGBUR MODERATE (A) 03/31/2017 0945   BILIRUBINUR NEGATIVE 03/31/2017 0945   KETONESUR NEGATIVE 03/31/2017 0945   PROTEINUR 30 (A) 03/31/2017 0945   NITRITE NEGATIVE 03/31/2017 0945   LEUKOCYTESUR NEGATIVE 03/31/2017 0945   Recent Results (from the past 240 hour(s))  Culture, respiratory (NON-Expectorated)     Status: None   Collection Time: 04/08/17  1:36 AM  Result Value Ref Range Status   Specimen Description  ENDOTRACHEAL  Final   Special Requests NONE  Final   Gram Stain   Final    FEW WBC PRESENT, PREDOMINANTLY PMN RARE SQUAMOUS EPITHELIAL CELLS PRESENT FEW GRAM POSITIVE COCCI    Culture Consistent with normal respiratory flora.  Final   Report Status 04/10/2017 FINAL  Final      Radiology Studies: Dg Chest Port 1 View  Result Date: 04/15/2017 CLINICAL DATA:  Respiratory failure. EXAM: PORTABLE CHEST 1 VIEW COMPARISON:  Radiograph April 12, 2017. FINDINGS: Stable cardiomediastinal silhouette and central pulmonary vascular congestion. Endotracheal tube has been removed and replaced with tracheostomy tube which is in grossly good position. Mild bibasilar subsegmental atelectasis or edema is noted. Bony thorax is unremarkable. No pneumothorax is noted. IMPRESSION: Stable central pulmonary vascular congestion with mild bibasilar subsegmental atelectasis or edema. Tracheostomy tube is in grossly good position. Electronically Signed   By: Marijo Conception, M.D.   On: 04/15/2017 08:36     LOS: 15 days   Time spent: 25 minutes.  Vance Gather, MD [facility] Pager [phone]  If 7PM-7AM, please contact night-coverage [email or web address] Password [email or web address] 04/16/2017, 12:21 PM

## 2017-04-17 DIAGNOSIS — J9602 Acute respiratory failure with hypercapnia: Secondary | ICD-10-CM

## 2017-04-17 LAB — BASIC METABOLIC PANEL
Anion gap: 7 (ref 5–15)
BUN: 22 mg/dL — ABNORMAL HIGH (ref 6–20)
CALCIUM: 9.6 mg/dL (ref 8.9–10.3)
CO2: 27 mmol/L (ref 22–32)
CREATININE: 0.71 mg/dL (ref 0.61–1.24)
Chloride: 102 mmol/L (ref 101–111)
Glucose, Bld: 148 mg/dL — ABNORMAL HIGH (ref 65–99)
Potassium: 4.4 mmol/L (ref 3.5–5.1)
SODIUM: 136 mmol/L (ref 135–145)

## 2017-04-17 LAB — CBC
HCT: 28 % — ABNORMAL LOW (ref 39.0–52.0)
Hemoglobin: 8.1 g/dL — ABNORMAL LOW (ref 13.0–17.0)
MCH: 28.2 pg (ref 26.0–34.0)
MCHC: 28.9 g/dL — ABNORMAL LOW (ref 30.0–36.0)
MCV: 97.6 fL (ref 78.0–100.0)
PLATELETS: 482 10*3/uL — AB (ref 150–400)
RBC: 2.87 MIL/uL — AB (ref 4.22–5.81)
RDW: 18.2 % — ABNORMAL HIGH (ref 11.5–15.5)
WBC: 13.1 10*3/uL — AB (ref 4.0–10.5)

## 2017-04-17 LAB — GLUCOSE, CAPILLARY
GLUCOSE-CAPILLARY: 165 mg/dL — AB (ref 65–99)
GLUCOSE-CAPILLARY: 172 mg/dL — AB (ref 65–99)
GLUCOSE-CAPILLARY: 150 mg/dL — AB (ref 65–99)
Glucose-Capillary: 146 mg/dL — ABNORMAL HIGH (ref 65–99)
Glucose-Capillary: 157 mg/dL — ABNORMAL HIGH (ref 65–99)
Glucose-Capillary: 138 mg/dL — ABNORMAL HIGH (ref 65–99)

## 2017-04-17 MED ORDER — SODIUM CHLORIDE 3 % IN NEBU
4.0000 mL | INHALATION_SOLUTION | Freq: Every day | RESPIRATORY_TRACT | Status: AC
  Administered 2017-04-18 – 2017-04-20 (×3): 4 mL via RESPIRATORY_TRACT
  Filled 2017-04-17 (×4): qty 4

## 2017-04-17 NOTE — Progress Notes (Signed)
PULMONARY / CRITICAL CARE MEDICINE   Name: Joe Wilkinson MRN: [id number] DOB: [birth year]    ADMISSION DATE:  03/31/2017 CHIEF COMPLAINT:  CVA  HISTORY OF PRESENT ILLNESS:   56 yo male brought to [facility] with altered mental status from DKA and sepsis.  Found to have RT MCA CVA and transfer to [facility].  Required Rt decompressive craniectomy and remained on vent post op.  SUBJECTIVE:  Tolerating ATC well x 2 days. Progression with PMV complicated by secretions.   VITAL SIGNS: BP 139/73   Pulse 100   Temp 99.9 F (37.7 C) (Oral)   Resp (!) 31   Ht 5\' 10"  (1.778 m)   Wt 89.9 kg (198 lb 3.1 oz)   SpO2 98%   BMI 28.44 kg/m   VENTILATOR SETTINGS: FiO2 (%):  [28 %-35 %] 28 %  INTAKE / OUTPUT: I/O last 3 completed shifts: In: 2960 [Other:100; NG/GT:2860] Out: 2225 [Urine:2025; Drains:200]  PHYSICAL EXAMINATION:  Gen:      Middle aged male of normal body habitus in NAD on ATC HEENT:  Head bulges r side where flap missing, atraumatic, PERRL, no JVD Neck:      Trach c/d/I , midline and secure Lungs:   Resps even, unlabored. Secretions in cannula.  CV:         RRR, Nl S1/S2, -M/R/G. Abd:     Soft, NT, ND Ext:   Trace edema Skin:       Warm, intact, without rash or lesions Neuro:   Awake, alert, moves R side to command.  BMET  Recent Labs Lab 04/14/17 0451 04/15/17 0245 04/17/17 0233  NA 137 137 136  K 3.7 4.4 4.4  CL 102 101 102  CO2 28 27 27   BUN 15 16 22*  CREATININE 0.79 0.90 0.71  GLUCOSE 192* 191* 148*    Electrolytes  Recent Labs Lab 04/11/17 0219 04/12/17 0151  04/14/17 0451 04/15/17 0245 04/17/17 0233  CALCIUM 8.7* 8.5*  < > 9.0 9.3 9.6  MG 2.1 2.2  --   --   --   --   PHOS 3.6 4.4  --   --   --   --   < > = values in this interval not displayed.  CBC  Recent Labs Lab 04/15/17 0245 04/16/17 0222 04/17/17 0233  WBC 13.9* 12.1* 13.1*  HGB 7.7* 7.5* 8.1*  HCT 26.0* 24.7* 28.0*  PLT 387 435* 482*    Coag's  Recent Labs Lab 04/12/17 1233  INR  1.08    Sepsis Markers No results for input(s): LATICACIDVEN, PROCALCITON, O2SATVEN in the last 168 hours.  ABG  Recent Labs Lab 04/12/17 0342 04/14/17 1742  PHART 7.471* 7.519*  PCO2ART 40.8 39.6  PO2ART 81.0* 60.0*    Liver Enzymes No results for input(s): AST, ALT, ALKPHOS, BILITOT, ALBUMIN in the last 168 hours.  Cardiac Enzymes No results for input(s): TROPONINI, PROBNP in the last 168 hours.  Glucose  Recent Labs Lab 04/16/17 1556 04/16/17 1927 04/16/17 2319 04/17/17 0315 04/17/17 0754 04/17/17 1231  GLUCAP 144* 172* 123* 165* 146* 172*    Imaging No results found.  STUDIES:  LP 8/24 >> glucose 205, RBC 16, protein 62, WBC 5 CT head 8/24 >> Large Rt MCA EEG 8/27 >> bilateral cerebral dysfunction, non-specific in etiology Echo 8/27 >> EF 65 to 70% CT abd/pelvis 8/30 >> third spacing, 3.5 cm low attenuation Lt hepatic lobe CT Head 9/3>> Severe right hemisphere cytotoxic edema with stable extension of edematous brain through the  right craniectomy defect. Stable petechial hemorrhage without malignant hemorrhagic Transformation. Stable intracranial mass effect with leftward midline shift of 7 mm. Small left PCA territory infarct.   CULTURES: CSF 8/24>> negative BCX 8/24>> negative UCX 8/24>> negative Sputum 8/24>> S. Agalactiae Sputum 9/1>> Consistent with normal resp. flora  ANTIBIOTICS: vanco 8/24 >> 8/26 Zosyn 8/24 >> 8/26, 9/1 >> Ampicillin 8/27 > 8/30  SIGNIFICANT EVENTS: 8/23 Admit [facility] 8/23 8/24 Transfer to [facility] after Rt MCA CVA, start 3% NS 8/25 Decompressive craniectomy 8/27 Transfuse 1 unit PRBC 8/28 Transfuse 1 unit PRBC, A fib with RVR 8/30 Transfuse 1 unit PRBC 8/31 Off pressors, reintubated x 2, transfuse PRBC 9/5 Trach 9/6 PEG  LINES/TUBES: ETT 8/23 >>9/5 Trach [name]) 9/5>>> Lt IJ CVL 8/23 >> PEG 9/6 >  ASSESSMENT / PLAN:  Acute on chronic hypoxemic respiratory failure  Small pleural effusion - Continue ATC as tolerated  - 35% Fio2 (Tolerating x 48 hours now) - Responded well to diuresis, about even for admission - Would repeat CXR in AM, and if vascular congestion persists, diurese further.  - Chest PT manual - Hypertonic saline nebs to aid airway clearance.  - Continued work with SLP and PMV - Secretions and cognition are barrier to trach downsize/decannulation.   Dysphagia - continue TF - NPO - SLP following  DKA: resolved  Sepsis secondary to S. Agalactiae PNA: ABX off 8/30  Septic shock - resolved   Georgann Housekeeper, AGACNP-BC [facility] Pulmonology/Critical Care Pager [phone] or [phone]  04/17/2017 4:05 PM

## 2017-04-17 NOTE — Progress Notes (Signed)
Trach care done per RRT. Site was oozing with thick mucoid secretions. Site was cleansed with NS and sterile water, dried, and clean dressing applied. New IC inserted and locked back into place without complications noted. Pt is stable at this time. Pt has a strong productive cough.

## 2017-04-17 NOTE — Progress Notes (Signed)
  Speech Language Pathology Treatment: Dysphagia;Cognitive-Linquistic;Passy Muir Speaking valve  Patient Details Name: Joe Wilkinson MRN: [id number] DOB: November 08, 1960 Today's Date: 04/17/2017 Time: 1450-1510 SLP Time Calculation (min) (ACUTE ONLY): 20 min  Assessment / Plan / Recommendation Clinical Impression  Pt is alert and follows commands with Min cues. SLP provided Max cues to attend to the left side of his environment with his gaze hitting midline but not going beyond. He did reach over with his right hand to grab his left. Pt did not wear the PMV as long today, suspect related to the amount of secretions he was producing tracheally. Upon PMV placement his RR would climb and he would become more restless. His phonation was minimal but audibly wet, but he could only cough to expel secretions when PMV was removed. Orally he also had some pooling of secretions that were suctioned by SLP. Max cues were provided to elicit a pharyngeal swallow with 3 swallows observed today, all of which were followed by delayed coughing. Would continue current plan of care.   HPI HPI: 56 yo male brought to [facility] with altered mental status from DKA and sepsis. Found to have RT MCA CVA and transfer to [facility]. Required Rt decompressive craniectomy and remained on vent post op.ETT 8/23-9/5, s/p tracheostomy 9/5.       SLP Plan  Continue with current plan of care       Recommendations  Diet recommendations: NPO Medication Administration: Via alternative means      Patient may use Passy-Muir Speech Valve: Intermittently with supervision PMSV Supervision: Full MD: Please consider changing trach tube to : Smaller size;Cuffless         Oral Care Recommendations: Oral care QID Follow up Recommendations: Inpatient Rehab SLP Visit Diagnosis: Dysphagia, oropharyngeal phase (R13.12);Aphonia (R49.1);Cognitive communication deficit (G25.427) Plan: Continue with current plan of care       GO                 [name] 04/17/2017, 3:25 PM  [name], M.A. CCC-SLP [phone]

## 2017-04-17 NOTE — Progress Notes (Signed)
PROGRESS NOTE  Ozzie Knobel  OAC:[id number] DOB: 1960-12-04 DOA: 03/31/2017 PCP: Patient, No Pcp Per   Brief Narrative: Jarryn Altland is a 56 y.o. male with a history of DM who was [place] unresponsive [place] bus stop, taken to [facility] found to be in DKA with sepsis from uncertain source. He developed kussmaul breathing requiring intubation and hemiparesis and found to have massive right MCA and PCA territory infacrts with mass effect prompting transfer to [facility] under ICU care. He underwent decompressive right craniectomy 8/25. Hypertonic saline was started per neurology recommendations. He required transfusion support and was treated for AFib with RVR, continued on the ventilator, eventually receiving trach 9/5 and PEG 9/6, on ATC 9/7.   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 agalactiae PNA, then restarted on zosyn 9/1 - 9/8 due to fever, concern for aspiration.  - Continue trach, prn vent - Keep negative balance with lasix. Weight today likely spurious. - Completed course of zosyn 9/1 - 9/8 with negative respiratory cultures. Leukocytosis improving.   Massive right MCA territory infarct with PCA watershed infarct, subsequent cytotoxic edema requiring decompressive craniectomy:  - ASA per neurology - Now trach/PEG-dependent. Tube feeding at goal.  - Tapered hypertonic saline per neurology. Monitor BMP.  - Sutures D/C'ed, plan to follow up with neurosurgery in 3 months to assess for cranioplasty.  AFib with RVR: Currently NSR without AV blocking agents scheduled.  - Continue cardiac monitoring, no anticoagulation, just ASA. - Metoprolol IV prn  Acute anemia: CT abdomen negative, no gross GI bleeding. EGD at time of PEG  placement showed normal esophagus, stomach, duodenal bulb. Required transfusions x4.  - FOBT pending, LBM 9/8, but no sample collected. - PPI for SUP daily. - Monitor CBC daily, transfuse to maintain hgb >7 or w/bleeding. If stable tomorrow will spread out checks.  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  - TF with glucerna - Continue detemir 34u daily, mealtime 3u + res SSI q4h.   Septic shock due to S. agalactiae pneumonia: Resolved, completed abx 8/30.   DVT prophylaxis: Aspirin Code Status: Full Family Communication: None at bedside Disposition Plan: He is stable for discharge. Will discuss with CSW for ?LTAC disposition.  Consultants:   PCCM  Neurology  Neurosurgery  Procedures:  LP 8/24 >> glucose 205, RBC 16, protein 62, WBC 5 CT head 8/24 >> Large Rt MCA EEG 8/27 >> bilateral cerebral dysfunction, non-specific in etiology Echo 8/27 >> EF 65 to 70% CT abd/pelvis 8/30 >> third spacing, 3.5 cm low attenuation Lt hepatic lobe CT Head 9/3>> Severe right hemisphere cytotoxic edema with stable extension of edematous brain through the right craniectomy defect. Stable petechial hemorrhage without malignant hemorrhagic transformation. Stable intracranial mass effect with leftward midline shift of 7 mm. Small left PCA territory infarct.  Cultures/antimicrobials: CSF 8/24: negative BCX 8/24: negative UCX 8/24: negative Sputum 8/24: S. Agalactiae Sputum 9/1:ormal resp. flora  Vancomycin 8/24 - 8/26 Zosyn 8/24 - 8/26, 9/1 - 9/8 Ampicillin 8/27 - 8/30  Subjective: By putting thumb up or down, denies pain or any other concerns.   Objective: Vitals:   04/17/17 0326 04/17/17 0747 04/17/17 0755 04/17/17 1140  BP:  122/74 122/74 116/74  Pulse:  (!) 104 99 (!) 102  Resp:  Marland Kitchen)  23 (!) 31 (!) 21  Temp:   99 F (37.2 C)   TempSrc:   Oral   SpO2:  96% 97% 98%  Weight: 89.9 kg (198 lb 3.1 oz)     Height:        Intake/Output Summary (Last 24 hours) at 04/17/17 1221 Last data filed  at 04/17/17 1100  Gross per 24 hour  Intake             1010 ml  Output             1950 ml  Net             -940 ml   Filed Weights   04/15/17 0438 04/16/17 0323 04/17/17 0326  Weight: 96.7 kg (213 lb 3 oz) 90.4 kg (199 lb 4.7 oz) 89.9 kg (198 lb 3.1 oz)   Gen: 56 y.o. male in no distress.  Pulm: Non-labored with O2 thru trach. Scant bibasilar crackles laterally.  CV: Regular borderline tachycardia. No murmur, rub, or gallop. No JVD, trace pedal edema. GI: Abdomen soft, non-tender, non-distended, with normoactive bowel sounds. No organomegaly or masses felt. PEG tube site in LUQ c/d/i without erythema. Ext: Warm, no deformities Skin: No rashes, lesions no ulcers Neuro: Answers questions appropriately with right hand thumbs up or down. Follows commands moving legs and arm on right. Right gaze preference without movement on left side.   Psych: Unable to determine  CBC:  Recent Labs Lab 04/13/17 0239 04/14/17 0451 04/15/17 0245 04/16/17 0222 04/17/17 0233  WBC 12.9* 13.1* 13.9* 12.1* 13.1*  HGB 7.2* 7.7* 7.7* 7.5* 8.1*  HCT 24.7* 25.6* 26.0* 24.7* 28.0*  MCV 99.2 98.1 98.5 98.0 97.6  PLT 278 337 387 435* 858*   Basic Metabolic Panel:  Recent Labs Lab 04/11/17 0219  04/12/17 0151 04/12/17 0833 04/13/17 0239 04/14/17 0451 04/15/17 0245 04/17/17 0233  NA 143  < > 139 138 137 137 137 136  K 3.9  --  3.8  --  4.1 3.7 4.4 4.4  CL 111  --  106  --  103 102 101 102  CO2 27  --  26  --  28 28 27 27   GLUCOSE 209*  --  147*  --  125* 192* 191* 148*  BUN 12  --  15  --  16 15 16  22*  CREATININE 0.82  --  0.75  --  0.67 0.79 0.90 0.71  CALCIUM 8.7*  --  8.5*  --  8.7* 9.0 9.3 9.6  MG 2.1  --  2.2  --   --   --   --   --   PHOS 3.6  --  4.4  --   --   --   --   --   < > = values in this interval not displayed. GFR: Estimated Creatinine Clearance: 116.4 mL/min (by C-G formula based on SCr of 0.71 mg/dL).  CBG:  Recent Labs Lab 04/16/17 1556 04/16/17 1927 04/16/17 2319  04/17/17 0315 04/17/17 0754  GLUCAP 144* 172* 123* 165* 146*   Urine analysis:    Component Value Date/Time   COLORURINE YELLOW (A) 03/31/2017 0945   APPEARANCEUR CLOUDY (A) 03/31/2017 0945   LABSPEC 1.018 03/31/2017 0945   PHURINE 5.0 03/31/2017 0945   GLUCOSEU 50 (A) 03/31/2017 0945   HGBUR MODERATE (A) 03/31/2017 0945   BILIRUBINUR NEGATIVE 03/31/2017 0945   KETONESUR NEGATIVE 03/31/2017 0945   PROTEINUR 30 (A) 03/31/2017 0945   NITRITE NEGATIVE 03/31/2017 0945  LEUKOCYTESUR NEGATIVE 03/31/2017 0945   Recent Results (from the past 240 hour(s))  Culture, respiratory (NON-Expectorated)     Status: None   Collection Time: 04/08/17  1:36 AM  Result Value Ref Range Status   Specimen Description ENDOTRACHEAL  Final   Special Requests NONE  Final   Gram Stain   Final    FEW WBC PRESENT, PREDOMINANTLY PMN RARE SQUAMOUS EPITHELIAL CELLS PRESENT FEW GRAM POSITIVE COCCI    Culture Consistent with normal respiratory flora.  Final   Report Status 04/10/2017 FINAL  Final      Radiology Studies: No results found.   LOS: 16 days   Time spent: 25 minutes.  Vance Gather, MD [facility] Pager [phone]  If 7PM-7AM, please contact night-coverage [email or web address] Password [email or web address] 04/17/2017, 12:21 PM

## 2017-04-18 ENCOUNTER — Inpatient Hospital Stay (HOSPITAL_COMMUNITY): Payer: Medicaid Other

## 2017-04-18 LAB — GLUCOSE, CAPILLARY
GLUCOSE-CAPILLARY: 107 mg/dL — AB (ref 65–99)
GLUCOSE-CAPILLARY: 158 mg/dL — AB (ref 65–99)
GLUCOSE-CAPILLARY: 189 mg/dL — AB (ref 65–99)
Glucose-Capillary: 159 mg/dL — ABNORMAL HIGH (ref 65–99)
Glucose-Capillary: 129 mg/dL — ABNORMAL HIGH (ref 65–99)
Glucose-Capillary: 182 mg/dL — ABNORMAL HIGH (ref 65–99)

## 2017-04-18 LAB — CBC
HCT: 29.5 % — ABNORMAL LOW (ref 39.0–52.0)
Hemoglobin: 8.7 g/dL — ABNORMAL LOW (ref 13.0–17.0)
MCH: 28.8 pg (ref 26.0–34.0)
MCHC: 29.5 g/dL — ABNORMAL LOW (ref 30.0–36.0)
MCV: 97.7 fL (ref 78.0–100.0)
PLATELETS: 503 10*3/uL — AB (ref 150–400)
RBC: 3.02 MIL/uL — AB (ref 4.22–5.81)
RDW: 18.5 % — AB (ref 11.5–15.5)
WBC: 13.9 10*3/uL — AB (ref 4.0–10.5)

## 2017-04-18 NOTE — Progress Notes (Signed)
CPT held. Pt has a strong spontaneous productive cough. Mucous/Airway clearance is not an issue at this time. Pt has an effective cough with good mucociliary clearance.  Pt coughs out thick white/clear mucoid secretions out of his trach flanged. Chest Vest at bedside if needed. Pt is stable at this time. RT will monitor.

## 2017-04-18 NOTE — Progress Notes (Signed)
  Speech Language Pathology Treatment: Dysphagia;Cognitive-Linquistic;Passy Muir Speaking valve  Patient Details Name: Joe Wilkinson MRN: [id number] DOB: 06-02-1961 Today's Date: 04/18/2017 Time: 1884-1660 SLP Time Calculation (min) (ACUTE ONLY): 16 min  Assessment / Plan / Recommendation Clinical Impression  Pt had improved tolerance of PMV today with not as many secretions observed. He wore the valve for 10 minutes prior to RR increasing. A burst of air suggestive of air trapping was noted upon removal. While PMV was in place, pt consumed ice chips trials with Max cues needed for oral containment. Even despite SLP attempts to place ice chip on the center of his tongue, he holds them anteriorly and often loses them from his mouth as he tries to masticate them. What he does maintain in his oral cavity he swallows with Mod cues after a suspected delay. Immediate coughing ensues, concerning for airway compromise. Cognitively, pt followed commands with Min cues and participated in basic, functional tasks with Min-Mod cues for problem solving. He mostly communicates via gestures but produces minimal phonation when given Max cues. Will continue to follow.   HPI HPI: 56 yo male brought to [facility] with altered mental status from DKA and sepsis. Found to have RT MCA CVA and transfer to [facility]. Required Rt decompressive craniectomy and remained on vent post op.ETT 8/23-9/5, s/p tracheostomy 9/5.       SLP Plan  Continue with current plan of care       Recommendations  Diet recommendations: NPO Medication Administration: Via alternative means      Patient may use Passy-Muir Speech Valve: Intermittently with supervision PMSV Supervision: Full MD: Please consider changing trach tube to : Smaller size;Cuffless         Oral Care Recommendations: Oral care QID Follow up Recommendations: Inpatient Rehab SLP Visit Diagnosis: Dysphagia, oropharyngeal phase (R13.12);Aphonia (R49.1);Cognitive communication  deficit (Y30.160) Plan: Continue with current plan of care       GO                [name] 04/18/2017, 10:07 AM  [name], M.A. CCC-SLP [phone]

## 2017-04-18 NOTE — Plan of Care (Signed)
Problem: Activity: Goal: Risk for activity intolerance will decrease Outcome: Not Progressing Pt. is very active with the right side of his body, however the left side continues to remain flaccid.

## 2017-04-18 NOTE — Progress Notes (Signed)
PROGRESS NOTE  Joe Wilkinson  VQM:[id number] DOB: 1960-10-06 DOA: 03/31/2017 PCP: Patient, No Pcp Per   Brief Narrative: Joe Wilkinson is a 56 y.o. male with a history of DM who was [place] un[place] bus stop, taken to [facility] found to be in DKA with sepsis from uncertain source. He developed kussmaul breathing requiring intubation and hemiparesis and found to have massive right MCA and PCA territory infacrts with mass effect prompting transfer to [facility] under ICU care. He underwent decompressive right craniectomy 8/25. Hypertonic saline was started per neurology recommendations. He required transfusion support and was treated for AFib with RVR, continued on the ventilator, eventually receiving trach 9/5 and PEG 9/6, on ATC 9/7.   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 agalactiae PNA, then restarted on zosyn 9/1 - 9/8 due to fever, concern for aspiration.  - Continue trach, prn vent - Keep negative balance with lasix.  - Completed course of zosyn 9/1 - 9/8 with negative respiratory cultures. Leukocytosis improving.   Massive right MCA territory infarct with PCA watershed infarct, subsequent cytotoxic edema requiring decompressive craniectomy:  - ASA per neurology - Now trach/PEG-dependent. Tube feeding at goal.  - Tapered hypertonic saline per neurology, no longer keeping hypernatremic.   - Sutures D/C'ed, plan to follow up with neurosurgery in 3 months to assess for cranioplasty. - Continue PT, OT, SLP  AFib with RVR: Currently NSR without AV blocking agents scheduled.  - Continue cardiac monitoring, no anticoagulation, just ASA. - Metoprolol IV prn  Acute anemia: CT abdomen negative, no gross GI bleeding. EGD  at time of PEG placement showed normal esophagus, stomach, duodenal bulb. Required transfusions x4.  - FOBT pending, LBM 9/8, but no sample collected. - PPI for SUP daily. - Monitor CBC daily, transfuse to maintain hgb >7 or w/bleeding. If stable tomorrow will spread out checks.  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  - TF with glucerna - Continue detemir 34u daily, mealtime 3u + res SSI q4h.   Septic shock due to S. agalactiae pneumonia: Resolved, completed abx 8/30.   DVT prophylaxis: Aspirin Code Status: Full Family Communication: None at bedside Disposition Plan: He is stable for discharge. Will discuss with CSW for ?LTAC disposition.  Consultants:   PCCM  Neurology  Neurosurgery  Procedures:  LP 8/24 >> glucose 205, RBC 16, protein 62, WBC 5 CT head 8/24 >> Large Rt MCA EEG 8/27 >> bilateral cerebral dysfunction, non-specific in etiology Echo 8/27 >> EF 65 to 70% CT abd/pelvis 8/30 >> third spacing, 3.5 cm low attenuation Lt hepatic lobe CT Head 9/3>> Severe right hemisphere cytotoxic edema with stable extension of edematous brain through the right craniectomy defect. Stable petechial hemorrhage without malignant hemorrhagic transformation. Stable intracranial mass effect with leftward midline shift of 7 mm. Small left PCA territory infarct.  Cultures/antimicrobials: CSF 8/24: negative BCX 8/24: negative UCX 8/24: negative Sputum 8/24: S. Agalactiae Sputum 9/1:ormal resp. flora  Vancomycin 8/24 - 8/26 Zosyn 8/24 - 8/26, 9/1 - 9/8 Ampicillin 8/27 - 8/30  Subjective: By putting thumb up or down, denies pain or any other concerns. tolerating PMV for a few minutes today, secretions improving.   Objective: Vitals:   04/18/17 1137 04/18/17 1200 04/18/17 1222 04/18/17 1300  BP:  114/74 117/74 119/69 112/60  Pulse: (!) 107 (!) 104 (!) 102 94  Resp: 20 18 (!) 23 (!) 30  Temp:   99.6 F (37.6 C)   TempSrc:   Axillary   SpO2: 100% 95% 98% 91%  Weight:      Height:         Intake/Output Summary (Last 24 hours) at 04/18/17 1359 Last data filed at 04/18/17 0936  Gross per 24 hour  Intake             1580 ml  Output             1400 ml  Net              180 ml   Filed Weights   04/16/17 0323 04/17/17 0326 04/18/17 0339  Weight: 90.4 kg (199 lb 4.7 oz) 89.9 kg (198 lb 3.1 oz) 88 kg (194 lb 0.1 oz)   Gen: 56 y.o. male in no distress.  Pulm: Non-labored with O2 thru trach. Scant bibasilar crackles laterally.  CV: Regular borderline tachycardia. No murmur, rub, or gallop. No JVD, trace pedal edema. GI: Abdomen soft, non-tender, non-distended, with normoactive bowel sounds. No organomegaly or masses felt. PEG tube site in LUQ c/d/i without erythema. Ext: Warm, no deformities Skin: No rashes, lesions no ulcers Neuro: Answers questions appropriately with right hand thumbs up or down, no phonation. Follows commands moving legs and arm on right. Right gaze preference without movement on left side.   Psych: Unable to determine  CBC:  Recent Labs Lab 04/14/17 0451 04/15/17 0245 04/16/17 0222 04/17/17 0233 04/18/17 0245  WBC 13.1* 13.9* 12.1* 13.1* 13.9*  HGB 7.7* 7.7* 7.5* 8.1* 8.7*  HCT 25.6* 26.0* 24.7* 28.0* 29.5*  MCV 98.1 98.5 98.0 97.6 97.7  PLT 337 387 435* 482* 277*   Basic Metabolic Panel:  Recent Labs Lab 04/12/17 0151 04/12/17 0833 04/13/17 0239 04/14/17 0451 04/15/17 0245 04/17/17 0233  NA 139 138 137 137 137 136  K 3.8  --  4.1 3.7 4.4 4.4  CL 106  --  103 102 101 102  CO2 26  --  28 28 27 27   GLUCOSE 147*  --  125* 192* 191* 148*  BUN 15  --  16 15 16  22*  CREATININE 0.75  --  0.67 0.79 0.90 0.71  CALCIUM 8.5*  --  8.7* 9.0 9.3 9.6  MG 2.2  --   --   --   --   --   PHOS 4.4  --   --   --   --   --    GFR: Estimated Creatinine Clearance: 115.2 mL/min (by C-G formula based on SCr of 0.71 mg/dL).  CBG:  Recent Labs Lab 04/17/17 2051 04/18/17 0015 04/18/17 0352 04/18/17 0755 04/18/17 1219  GLUCAP 138* 158* 159* 129*  182*   Urine analysis:    Component Value Date/Time   COLORURINE YELLOW (A) 03/31/2017 0945   APPEARANCEUR CLOUDY (A) 03/31/2017 0945   LABSPEC 1.018 03/31/2017 0945   PHURINE 5.0 03/31/2017 0945   GLUCOSEU 50 (A) 03/31/2017 0945   HGBUR MODERATE (A) 03/31/2017 0945   BILIRUBINUR NEGATIVE 03/31/2017 0945   KETONESUR NEGATIVE 03/31/2017 0945   PROTEINUR 30 (A) 03/31/2017 0945   NITRITE NEGATIVE 03/31/2017 0945   LEUKOCYTESUR NEGATIVE 03/31/2017 0945   Radiology Studies: Dg Chest Port 1 View  Result Date: 04/18/2017 CLINICAL DATA:  Tracheostomy patient, pulmonary edema, status post CVA. History of diabetes. EXAM: PORTABLE CHEST 1 VIEW COMPARISON:  Portable chest x-ray of April 15, 2017 FINDINGS: The lungs are hypoinflated. The interstitial markings remain mildly prominent greatest on the left. There is no alveolar infiltrate or significant pleural effusion. There is no pneumothorax. The tracheostomy tube tip projects at the level of the clavicular heads. The heart is normal in size. The central pulmonary vascularity is prominent. IMPRESSION: Slight improvement in the appearance of the pulmonary interstitium especially on the right. Persistent low-grade interstitial edema. No alveolar pneumonia. Electronically Signed   By: David  Martinique M.D.   On: 04/18/2017 07:34     LOS: 17 days   Time spent: 25 minutes.  Vance Gather, MD [facility] Pager [phone]  If 7PM-7AM, please contact night-coverage [email or web address] Password [email or web address] 04/18/2017, 1:59 PM

## 2017-04-18 NOTE — Progress Notes (Signed)
Trach care done per RRT. No complications noted, no respiratory compromise noted. New ATC set up provided.

## 2017-04-18 NOTE — Progress Notes (Addendum)
Nutrition Follow-up  INTERVENTION:    Continue Glucerna 1.2 at goal rate of 65 ml/hr with Prostat 30 ml BID  TF regimen providing 2072 kcal, 123 grams protein, and 1255 ml free water daily  NUTRITION DIAGNOSIS:   Inadequate oral intake related to inability to eat as evidenced by NPO status, ongoing  GOAL:   Patient will meet greater than or equal to 90% of their needs, met  MONITOR:   Diet advancement, PO intake, Labs, Weight trends, Skin, I & O's  ASSESSMENT:   Pt admitted to [facility] with AMS from DKA and sepsis, found to have R MCA CVA with 12 mm midline shift and transferred to [facility], required R decompressive craniectomy.   9/5 trach placed 9/6 PEG placed  Pt currently on trach collar. TF regimen: Glucerna 1.2 at 65 ml/hr with Prostat 30 ml BID. Receiving CPT per RT prn. Speech Path following for dysphagia & PMSV. Labs and medications reviewed. CBG's [phone].  Diet Order:  Diet NPO time specified  Skin:   (Surgical incision to R skull)  Last BM:  9/10   Intake/Output Summary (Last 24 hours) at 04/18/17 1246 Last data filed at 04/18/17 0936  Gross per 24 hour  Intake             1580 ml  Output             1400 ml  Net              180 ml   Height:   Ht Readings from Last 1 Encounters:  04/13/17 '5\' 10"'$  (1.778 m)   Weight:   Wt Readings from Last 1 Encounters:  04/18/17 194 lb 0.1 oz (88 kg)  Admission weight 204 lb (92.7 kg)   Ideal Body Weight:  75.45 kg  BMI:  Body mass index is 27.84 kg/m.  Estimated Nutritional Needs:   Kcal:  2100-2300  Protein:  115-130 gm  Fluid:  2.1-2.3 L  EDUCATION NEEDS:   No education needs identified at this time  Arthur Holms, RD, LDN Pager #: [phone] After-Hours Pager #: [phone]

## 2017-04-18 NOTE — Progress Notes (Signed)
Patient CPT held and changed to PRN. Patient is able to adequately clear secretions on own. Has strong productive cough with thick, white, clear secretions. Patient is stable, no respiratory distress or compromise, patient is resting comfortably at this time. RT will continue to monitor.

## 2017-04-19 LAB — BASIC METABOLIC PANEL
ANION GAP: 6 (ref 5–15)
BUN: 25 mg/dL — ABNORMAL HIGH (ref 6–20)
CO2: 26 mmol/L (ref 22–32)
Calcium: 10 mg/dL (ref 8.9–10.3)
Chloride: 105 mmol/L (ref 101–111)
Creatinine, Ser: 0.78 mg/dL (ref 0.61–1.24)
GFR calc Af Amer: >60 mL/min (ref >60)
GFR calc non Af Amer: >60 mL/min (ref >60)
GLUCOSE: 161 mg/dL — AB (ref 65–99)
POTASSIUM: 4.6 mmol/L (ref 3.5–5.1)
Sodium: 137 mmol/L (ref 135–145)

## 2017-04-19 LAB — GLUCOSE, CAPILLARY
GLUCOSE-CAPILLARY: 149 mg/dL — AB (ref 65–99)
GLUCOSE-CAPILLARY: 153 mg/dL — AB (ref 65–99)
GLUCOSE-CAPILLARY: 164 mg/dL — AB (ref 65–99)
GLUCOSE-CAPILLARY: 168 mg/dL — AB (ref 65–99)
GLUCOSE-CAPILLARY: 179 mg/dL — AB (ref 65–99)
Glucose-Capillary: 141 mg/dL — ABNORMAL HIGH (ref 65–99)
Glucose-Capillary: 150 mg/dL — ABNORMAL HIGH (ref 65–99)

## 2017-04-19 LAB — CBC
HEMATOCRIT: 29.1 % — AB (ref 39.0–52.0)
Hemoglobin: 8.8 g/dL — ABNORMAL LOW (ref 13.0–17.0)
MCH: 29.1 pg (ref 26.0–34.0)
MCHC: 30.2 g/dL (ref 30.0–36.0)
MCV: 96.4 fL (ref 78.0–100.0)
Platelets: 578 10*3/uL — ABNORMAL HIGH (ref 150–400)
RBC: 3.02 MIL/uL — AB (ref 4.22–5.81)
RDW: 18.3 % — ABNORMAL HIGH (ref 11.5–15.5)
WBC: 16.3 10*3/uL — AB (ref 4.0–10.5)

## 2017-04-19 NOTE — Progress Notes (Signed)
PROGRESS NOTE  Joe Wilkinson  QQV:[id number] DOB: May 11, 1961 DOA: 03/31/2017 PCP: Patient, No Pcp Per   Brief Narrative: 16  DM who was [place] un[place] bus [place],  taken to [facility] found to be in DKA with sepsis from uncertain source He developed kussmaul breathing requiring intubation and hemiparesis and found to have massive right MCA and PCA territory infacrts with mass effect prompting transfer to [facility] under ICU care.  He underwent decompressive right craniectomy 8/25.  Hypertonic saline was started per neurology recommendations.  He required transfusion support and was treated for AFib with RVR, continued on the ventilator, eventually receiving trach 9/5 and PEG 9/6, on ATC 9/7.   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 agalactiae PNA, then restarted on zosyn 9/1 - 9/8 due to fever, concern for aspiration.  - Continue trach, prn vent - Keep negative balance with lasix.  - Completed course of zosyn 9/1 - 9/8 with negative respiratory cultures. Leukocytosis improving.   Massive right MCA territory infarct with PCA watershed infarct, subsequent cytotoxic edema requiring decompressive craniectomy:  - ASA per neurology - Now trach/PEG-dependent. Tube feeding at goal.  - Tapered hypertonic saline per neurology, no longer keeping hypernatremic.   - Sutures D/C'ed, plan to follow up with neurosurgery in 3 months to assess for cranioplasty. - Continue PT, OT, SLP  AFib with RVR: Currently NSR without AV blocking agents scheduled.  - Continue cardiac monitoring, no anticoagulation, just ASA. - Metoprolol IV prn  Acute anemia: CT abdomen negative, no gross GI bleeding. EGD at time of PEG placement showed  normal esophagus, stomach, duodenal bulb. Required transfusions x4.  - FOBT pending, LBM 9/8, but no sample collected. - PPI for SUP daily. - Monitor CBC daily, transfuse to maintain hgb >7 or w/bleeding.   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  - TF with glucerna - Continue detemir 34u daily, mealtime 3u + res SSI q4h.   Septic shock due to S. agalactiae pneumonia: Resolved, completed abx 8/30.   DVT prophylaxis: Aspirin Code Status: Full Family Communication: None at bedside Disposition Plan: He is stable for discharge. ? dispo as per CSW vs CM  Consultants:   PCCM  Neurology  Neurosurgery  Procedures:  LP 8/24 >> glucose 205, RBC 16, protein 62, WBC 5 CT head 8/24 >> Large Rt MCA EEG 8/27 >> bilateral cerebral dysfunction, non-specific in etiology Echo 8/27 >> EF 65 to 70% CT abd/pelvis 8/30 >> third spacing, 3.5 cm low attenuation Lt hepatic lobe CT Head 9/3>> Severe right hemisphere cytotoxic edema with stable extension of edematous brain through the right craniectomy defect. Stable petechial hemorrhage without malignant hemorrhagic transformation. Stable intracranial mass effect with leftward midline shift of 7 mm. Small left PCA territory infarct.  Cultures/antimicrobials: CSF 8/24: negative BCX 8/24: negative UCX 8/24: negative Sputum 8/24: S. Agalactiae Sputum 9/1:ormal resp. flora  Vancomycin 8/24 - 8/26 Zosyn 8/24 - 8/26, 9/1 - 9/8 Ampicillin 8/27 - 8/30  Subjective:  Not very communicative but able to nod in agreement or this agreement No new issues as per RN  Objective: Vitals:   04/19/17 0009 04/19/17 0025 04/19/17 0352 04/19/17 0440  BP: 111/62 111/62 (!) 100/55 112/80  Pulse: 76 88 68 (!) 102  Resp: (!)  23 (!) 22 (!) 27 (!) 29  Temp: 98.5 F (36.9 C)  98.5 F (36.9 C)   TempSrc: Axillary  Axillary   SpO2: 93% 95% 93% 99%  Weight:   88.6 kg (195 lb 5.2 oz)   Height:        Intake/Output Summary (Last 24 hours) at 04/19/17 0732 Last data filed  at 04/19/17 0424  Gross per 24 hour  Intake             1430 ml  Output             1325 ml  Net              105 ml   Filed Weights   04/17/17 0326 04/18/17 0339 04/19/17 0352  Weight: 89.9 kg (198 lb 3.1 oz) 88 kg (194 lb 0.1 oz) 88.6 kg (195 lb 5.2 oz)   Gen: 56 y.o. male in no distress.  Pulm: Non-labored with O2 thru trach. Mild discharge  CV: Regular borderline tachycardia. No murmur, rub, or gallop. No JVD, trace pedal edema. GI: Abdomen soft, non-tender, non-distended, with normoactive bowel sounds. No organomegaly or masses felt. PEG tube site in LUQ c/d/i without erythema. Ext: Warm, no deformities Skin: No rashes, lesions no ulcers Neuro: Answers questions appropriately with nodding , no phonation. Follows commands moving legs and arm on right. Right gaze preference without movement on left side.   Psych: Unable to determine  CBC:  Recent Labs Lab 04/15/17 0245 04/16/17 0222 04/17/17 0233 04/18/17 0245 04/19/17 0420  WBC 13.9* 12.1* 13.1* 13.9* 16.3*  HGB 7.7* 7.5* 8.1* 8.7* 8.8*  HCT 26.0* 24.7* 28.0* 29.5* 29.1*  MCV 98.5 98.0 97.6 97.7 96.4  PLT 387 435* 482* 503* 425*   Basic Metabolic Panel:  Recent Labs Lab 04/13/17 0239 04/14/17 0451 04/15/17 0245 04/17/17 0233 04/19/17 0420  NA 137 137 137 136 137  K 4.1 3.7 4.4 4.4 4.6  CL 103 102 101 102 105  CO2 28 28 27 27 26   GLUCOSE 125* 192* 191* 148* 161*  BUN 16 15 16  22* 25*  CREATININE 0.67 0.79 0.90 0.71 0.78  CALCIUM 8.7* 9.0 9.3 9.6 10.0   GFR: Estimated Creatinine Clearance: 115.5 mL/min (by C-G formula based on SCr of 0.78 mg/dL).  CBG:  Recent Labs Lab 04/18/17 1219 04/18/17 1550 04/18/17 2005 04/19/17 0012 04/19/17 0413  GLUCAP 182* 107* 189* 141* 150*   Urine analysis:    Component Value Date/Time   COLORURINE YELLOW (A) 03/31/2017 0945   APPEARANCEUR CLOUDY (A) 03/31/2017 0945   LABSPEC 1.018 03/31/2017 0945   PHURINE 5.0 03/31/2017 0945   GLUCOSEU 50 (A) 03/31/2017 0945     HGBUR MODERATE (A) 03/31/2017 0945   BILIRUBINUR NEGATIVE 03/31/2017 0945   KETONESUR NEGATIVE 03/31/2017 0945   PROTEINUR 30 (A) 03/31/2017 0945   NITRITE NEGATIVE 03/31/2017 0945   LEUKOCYTESUR NEGATIVE 03/31/2017 0945   Radiology Studies: Dg Chest Port 1 View  Result Date: 04/18/2017 CLINICAL DATA:  Tracheostomy patient, pulmonary edema, status post CVA. History of diabetes. EXAM: PORTABLE CHEST 1 VIEW COMPARISON:  Portable chest x-ray of April 15, 2017 FINDINGS: The lungs are hypoinflated. The interstitial markings remain mildly prominent greatest on the left. There is no alveolar infiltrate or significant pleural effusion. There is no pneumothorax. The tracheostomy tube tip projects at the level of the clavicular heads. The heart is normal in size. The central pulmonary vascularity is prominent. IMPRESSION: Slight improvement in the appearance of the pulmonary interstitium especially  on the right. Persistent low-grade interstitial edema. No alveolar pneumonia. Electronically Signed   By: David  Martinique M.D.   On: 04/18/2017 07:34     LOS: 18 days   Verneita Griffes, MD [facility] [phone]

## 2017-04-19 NOTE — Care Management Note (Addendum)
Case Management Note  Patient Details  Name: Joe Wilkinson MRN: [id number] Date of Birth: Oct 05, 1960  Subjective/Objective:    Patient was brought to [facility] with altered mental status from DKA and sepsis. Found to have RT MCA CVA and transfered to [facility]. Required Rt decompressive craniectomy and remained on vent post op. s/p tracheostomy 9/5.    9/12 [place], BSN - NCM was informed by CSW that MD would like to look into ltach for patient, patient does not have any insurance and patient seems more of snf patient.   9/13 [place], BSN - spoke with Medical Director and he states to ask LTACH to take a look at patient for charity.  NCM spoke with [place] and she states that they are full right now and will revisit next week but right now looking like does not meet criteria for LTACH. NCM informed Medical Director of this information and attending MD.  Patient will be for SNF for difficult to place.                  Action/Plan: NCM will follow for dc needs.   Expected Discharge Date:                  Expected Discharge Plan:     In-House Referral:  Clinical Social Work, Scientist, research (medical)  CM Consult  Post Acute Care Choice:    Choice offered to:     DME Arranged:    DME Agency:     HH Arranged:    [place] Agency:     Status of Service:  In process, will continue to follow  If discussed at Long Length of Stay Meetings, dates discussed:    Additional Comments:  Zenon Mayo, RN 04/19/2017, 4:05 PM

## 2017-04-19 NOTE — Progress Notes (Signed)
Upon chart review, noted that pt had expired order for restraints. Pt was NOT in restraints upon time of assessement 04/18/17 @ 1911. Angelia Mould, RN 04/19/2017 12:20 AM

## 2017-04-19 NOTE — Progress Notes (Signed)
  Speech Language Pathology Treatment: Dysphagia;Passy Muir Speaking valve  Patient Details Name: Joe Wilkinson MRN: [id number] DOB: 19-Jan-1961 Today's Date: 04/19/2017 Time: 3435-6861 SLP Time Calculation (min) (ACUTE ONLY): 16 min  Assessment / Plan / Recommendation Clinical Impression  Pt was verbalizing today at the word to short phrase level with PMV in place when given Max cues in functional context. He continues to have secretions that pool in his oral cavity, requiring suction. SLP provided trials of ice chips with Max cues for labial seal and posterior transit. Moderate amounts of anterior loss occur although pt still has immediate coughing after swallowing what little remains in his mouth. Recommend to remain NPO, with intermittent PMV use under full supervision from staff.   HPI HPI: 56 yo male brought to [facility] with altered mental status from DKA and sepsis. Found to have RT MCA CVA and transfer to [facility]. Required Rt decompressive craniectomy and remained on vent post op.ETT 8/23-9/5, s/p tracheostomy 9/5.       SLP Plan  Continue with current plan of care       Recommendations  Diet recommendations: NPO Medication Administration: Via alternative means      Patient may use Passy-Muir Speech Valve: Intermittently with supervision PMSV Supervision: Full MD: Please consider changing trach tube to : Smaller size;Cuffless         Oral Care Recommendations: Oral care QID Follow up Recommendations: Inpatient Rehab SLP Visit Diagnosis: Dysphagia, oropharyngeal phase (R13.12);Aphonia (R49.1) Plan: Continue with current plan of care       GO                [name] 04/19/2017, 11:49 AM  [name], M.A. CCC-SLP [phone]

## 2017-04-20 LAB — GLUCOSE, CAPILLARY
GLUCOSE-CAPILLARY: 156 mg/dL — AB (ref 65–99)
Glucose-Capillary: 159 mg/dL — ABNORMAL HIGH (ref 65–99)
Glucose-Capillary: 119 mg/dL — ABNORMAL HIGH (ref 65–99)
Glucose-Capillary: 165 mg/dL — ABNORMAL HIGH (ref 65–99)
Glucose-Capillary: 125 mg/dL — ABNORMAL HIGH (ref 65–99)
Glucose-Capillary: 167 mg/dL — ABNORMAL HIGH (ref 65–99)

## 2017-04-20 NOTE — Progress Notes (Signed)
Orthopedic Tech Progress Note Patient Details:  Joe Wilkinson 1960/08/15 [id number]  Ortho Devices Type of Ortho Device: Abdominal binder Ortho Device/Splint Location: abdomen Ortho Device/Splint Interventions: Criss Alvine 04/20/2017, 8:29 PM

## 2017-04-20 NOTE — Evaluation (Signed)
Occupational Therapy Evaluation Patient Details Name: Joe Wilkinson MRN: [id number] DOB: 1961-06-12 Today's Date: 04/20/2017    History of Present Illness 56 yo male brought to [facility] with altered mental status from DKA and sepsis.  Found to have RT MCA CVA and transfer to [facility]  Required Rt decompressive craniectomy and remained on vent post op.ETT 8/23-9/5, s/p tracheostomy 9/5   Clinical Impression   Pt currently presenting with decreased ability in ADL and mobility, Pt is overall total A for ADL and total A +2 for bed mobility, left side inattention/neglect, visual disturbance, flaccid left side with no sensation reported. Pt was able to communicate through gestures this session and was following single commands this session. Pt will benefit skilled OT in the acute setting and afterwards at SNF level to maximize independence and safety in ADL.   OF NOTE: VSS during session except RR elevated to 51 (O2 saturations on room air remained >94% throughout, returned to 99% with 28%FiO2)    Follow Up Recommendations  SNF;Supervision/Assistance - 24 hour    Equipment Recommendations  Other (comment) (defer to next venue)    Recommendations for Other Services       Precautions / Restrictions Precautions Precautions: Fall Precaution Comments: craniectomy, no bone flap right side; trach in place Restrictions Weight Bearing Restrictions: No      Mobility Bed Mobility Overal bed mobility: Needs Assistance Bed Mobility: Supine to Sit;Sit to Supine     Supine to sit: Total assist;+2 for physical assistance;HOB elevated Sit to supine: Total assist;+2 for physical assistance;HOB elevated   General bed mobility comments: helicopter technique to bring patient to EOB, increased time and effort to perform  Transfers                 General transfer comment: did not attempt at this time    Balance Overall balance assessment: Needs assistance Sitting-balance support: Single extremity  supported;Feet supported Sitting balance-Leahy Scale: Zero Sitting balance - Comments: total assist for EOB posterior support, limited evidence of righting reactions and corrective responses.  Postural control: Posterior lean                                 ADL either performed or assessed with clinical judgement   ADL Overall ADL's : Needs assistance/impaired Eating/Feeding: NPO   Grooming: Moderate assistance;Sitting;Wash/dry face Grooming Details (indicate cue type and reason): uses RUE, mod A and close monitoring for safety                             Functional mobility during ADLs:  (not safe to attempt at this time) General ADL Comments: Pt is total A for all other ADL at this time.      Vision Patient Visual Report: Other (comment) (right gaze preference) Vision Assessment?: Vision impaired- to be further tested in functional context Additional Comments: Pt will not cross midline despite multimodal cues     Perception     Praxis      Pertinent Vitals/Pain Pain Assessment: 0-10 Pain Score: 5  Pain Location: head at surgical site Pain Descriptors / Indicators: Headache Pain Intervention(s): Limited activity within patient's tolerance;Monitored during session;Repositioned     Hand Dominance Right   Extremity/Trunk Assessment Upper Extremity Assessment Upper Extremity Assessment: RUE deficits/detail;LUE deficits/detail RUE Deficits / Details: generalized weakness LUE Deficits / Details: no active movement in LUE - flaccid LUE Sensation:  (  no sensation reported by Pt) LUE Coordination:  (no coordination/movement at this time)   Lower Extremity Assessment Lower Extremity Assessment: RLE deficits/detail;LLE deficits/detail RLE Deficits / Details: noted generalized weakness RLE (gross motions against gravity 3/5; questionable tone with increased velocity of movement LLE Deficits / Details: No active movement noted in LLE (flaccid tone) LLE  Sensation:  (absent sensation per patient upon questioning) LLE Coordination:  (absent coordination of movement)       Communication Communication Communication: Tracheostomy   Cognition Arousal/Alertness: Awake/alert Behavior During Therapy: Flat affect Overall Cognitive Status: Difficult to assess Area of Impairment: Problem solving;Awareness;Safety/judgement;Following commands;Attention                   Current Attention Level: Sustained   Following Commands: Follows one step commands consistently;Follows one step commands with increased time     Problem Solving: Slow processing;Requires verbal cues;Requires tactile cues General Comments: patient demonstrates significant inattention to left side despite multi modal cues for awareness.    General Comments       Exercises     Shoulder Instructions      Home Living Family/patient expects to be discharged to:: Private residence Living Arrangements: Non-relatives/Friends Available Help at Discharge: Available 24 hours/day;Friend(s) Type of Home: Apartment Home Access: Level entry     Home Layout: One level     Bathroom Shower/Tub: Occupational psychologist: Standard                Prior Functioning/Environment Level of Independence: Independent                 OT Problem List: Decreased strength;Decreased range of motion;Decreased activity tolerance;Impaired balance (sitting and/or standing);Impaired vision/perception;Decreased coordination;Decreased cognition;Decreased safety awareness;Decreased knowledge of use of DME or AE;Decreased knowledge of precautions;Impaired sensation;Impaired UE functional use;Pain;Increased edema      OT Treatment/Interventions: Self-care/ADL training;Therapeutic exercise;Neuromuscular education;DME and/or AE instruction;Therapeutic activities;Cognitive remediation/compensation;Visual/perceptual remediation/compensation;Patient/family education;Balance training     OT Goals(Current goals can be found in the care plan section) Acute Rehab OT Goals Patient Stated Goal: none stated OT Goal Formulation: Patient unable to participate in goal setting ADL Goals Pt Will Perform Grooming: with min assist;sitting (EOB) Additional ADL Goal #1: Pt will perform bed mobility as precursor to ADL at min A level Additional ADL Goal #2: Pt will track items with multimodal cues past mdline 50% of the time Additional ADL Goal #3: Pt will tolerate sitting EOB with min A as precursor to performing ADL  OT Frequency: Min 2X/week   Barriers to D/C:            Co-evaluation PT/OT/SLP Co-Evaluation/Treatment: Yes Reason for Co-Treatment: Complexity of the patient's impairments (multi-system involvement);For patient/therapist safety PT goals addressed during session: Mobility/safety with mobility OT goals addressed during session: ADL's and self-care      AM-PAC PT "6 Clicks" Daily Activity     Outcome Measure Help from another person eating meals?: Total Help from another person taking care of personal grooming?: Total Help from another person toileting, which includes using toliet, bedpan, or urinal?: Total Help from another person bathing (including washing, rinsing, drying)?: Total Help from another person to put on and taking off regular upper body clothing?: Total Help from another person to put on and taking off regular lower body clothing?: Total 6 Click Score: 6   End of Session Equipment Utilized During Treatment: Oxygen Nurse Communication: Mobility status;Patient requests pain meds (needs suctioning)  Activity Tolerance: Patient tolerated treatment well Patient left: in bed;with  call bell/phone within reach;with bed alarm set;with nursing/sitter in room;with SCD's reapplied  OT Visit Diagnosis: Unsteadiness on feet (R26.81);Muscle weakness (generalized) (M62.81);Low vision, both eyes (H54.2);Other symptoms and signs involving the nervous system  (R29.898);Other symptoms and signs involving cognitive function;Hemiplegia and hemiparesis;Pain Hemiplegia - Right/Left: Left Hemiplegia - dominant/non-dominant: Non-Dominant Hemiplegia - caused by: Cerebral infarction Pain - Right/Left: Right Pain - part of body:  (head)                Time: 9326-7124 OT Time Calculation (min): 21 min Charges:  OT General Charges $OT Visit: 1 Visit OT Evaluation $OT Eval High Complexity: 1 High G-Codes:     Hulda Humphrey OTR/L [place] 04/20/2017, 5:07 PM

## 2017-04-20 NOTE — Evaluation (Signed)
Physical Therapy Evaluation Patient Details Name: Joe Wilkinson MRN: [id number] DOB: [birth year] Today's Date: 04/20/2017   History of Present Illness  56 yo male brought to [facility] with altered mental status from DKA and sepsis.  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  Required Rt decompressive craniectomy and remained on vent post op.ETT 8/23-9/5, s/p tracheostomy 9/5  Clinical Impression  Orders received for PT evaluation. Patient demonstrates deficits in functional mobility as indicated below. Will benefit from continued skilled PT to address deficits and maximize function. Will see as indicated and progress as tolerated.   OF NOTE: VSS during session except RR elevated to 51 (O2 saturations on room air remained >94% throughout, returned to 99% with 28%FiO2)    Follow Up Recommendations SNF;Supervision/Assistance - 24 hour    Equipment Recommendations  Other (comment) (TBD)    Recommendations for Other Services       Precautions / Restrictions Precautions Precautions: Fall Precaution Comments: craniectomy, no bone flap right side; trach in place Restrictions Weight Bearing Restrictions: No      Mobility  Bed Mobility Overal bed mobility: Needs Assistance Bed Mobility: Supine to Sit;Sit to Supine     Supine to sit: Total assist;+2 for physical assistance;HOB elevated Sit to supine: Total assist;+2 for physical assistance;HOB elevated   General bed mobility comments: helicopter technique to bring patient to EOB, increased time and effort to perform  Transfers                 General transfer comment: did not attempt at this time  Ambulation/Gait                Stairs            Wheelchair Mobility    Modified Rankin (Stroke Patients Only) Modified Rankin (Stroke Patients Only) Pre-Morbid Rankin Score: No symptoms Modified Rankin: Severe disability     Balance Overall balance assessment: Needs assistance Sitting-balance support: Single  extremity supported;Feet supported Sitting balance-Leahy Scale: Zero Sitting balance - Comments: total assist for EOB posterior support, limited evidence of righting reactions and corrective responses.  Postural control: Posterior lean                                   Pertinent Vitals/Pain Pain Assessment: 0-10 Pain Score: 5  Pain Location: head at surgical site Pain Descriptors / Indicators: Headache Pain Intervention(s): Limited activity within patient's tolerance;Monitored during session;Repositioned    Home Living Family/patient expects to be discharged to:: Private residence Living Arrangements: Non-relatives/Friends Available Help at Discharge: Available 24 hours/day;Friend(s) Type of Home: Apartment Home Access: Level entry     Home Layout: One level        Prior Function Level of Independence: Independent               Hand Dominance   Dominant Hand: Right    Extremity/Trunk Assessment   Upper Extremity Assessment Upper Extremity Assessment: RUE deficits/detail;LUE deficits/detail    Lower Extremity Assessment Lower Extremity Assessment: RLE deficits/detail;LLE deficits/detail RLE Deficits / Details: noted generalized weakness RLE (gross motions against gravity 3/5; questionable tone with increased velocity of movement LLE Deficits / Details: No active movement noted in LLE (flaccid tone) LLE Sensation:  (absent sensation per patient upon questioning) LLE Coordination:  (absent coordination of movement)       Communication   Communication: Tracheostomy  Cognition Arousal/Alertness: Awake/alert Behavior During Therapy: Flat affect Overall Cognitive Status:  Difficult to assess Area of Impairment: Problem solving;Awareness;Safety/judgement;Following commands;Attention                   Current Attention Level: Sustained   Following Commands: Follows one step commands consistently;Follows one step commands with increased time      Problem Solving: Slow processing;Requires verbal cues;Requires tactile cues General Comments: patient demonstrates significant inattention to left side despite multi modal cues for awareness.       General Comments      Exercises     Assessment/Plan    PT Assessment Patient needs continued PT services  PT Problem List Decreased strength;Decreased activity tolerance;Decreased balance;Decreased mobility;Decreased coordination;Decreased cognition;Decreased safety awareness;Impaired sensation;Impaired tone;Pain       PT Treatment Interventions DME instruction;Gait training;Functional mobility training;Therapeutic activities;Therapeutic exercise;Balance training;Neuromuscular re-education;Cognitive remediation;Patient/family education;Manual techniques;Modalities    PT Goals (Current goals can be found in the Care Plan section)  Acute Rehab PT Goals Patient Stated Goal: none stated PT Goal Formulation: Patient unable to participate in goal setting Time For Goal Achievement: 05/04/17 Potential to Achieve Goals: Fair    Frequency Min 3X/week   Barriers to discharge Decreased caregiver support      Co-evaluation PT/OT/SLP Co-Evaluation/Treatment: Yes Reason for Co-Treatment: Complexity of the patient's impairments (multi-system involvement) PT goals addressed during session: Mobility/safety with mobility         AM-PAC PT "6 Clicks" Daily Activity  Outcome Measure Difficulty turning over in bed (including adjusting bedclothes, sheets and blankets)?: Unable Difficulty moving from lying on back to sitting on the side of the bed? : Unable Difficulty sitting down on and standing up from a chair with arms (e.g., wheelchair, bedside commode, etc,.)?: Unable Help needed moving to and from a bed to chair (including a wheelchair)?: Total Help needed walking in hospital room?: Total Help needed climbing 3-5 steps with a railing? : Total 6 Click Score: 6    End of Session  Equipment Utilized During Treatment: Oxygen (ATC 28% FiO2) Activity Tolerance: Patient tolerated treatment well (Elevated RR 51, otherwise VSS) Patient left: in bed;with call bell/phone within reach Nurse Communication: Mobility status PT Visit Diagnosis: Other symptoms and signs involving the nervous system (R29.898);Hemiplegia and hemiparesis Hemiplegia - Right/Left: Left Hemiplegia - dominant/non-dominant: Non-dominant Hemiplegia - caused by: Nontraumatic intracerebral hemorrhage    Time: 1761-6073 PT Time Calculation (min) (ACUTE ONLY): 21 min   Charges:   PT Evaluation $PT Eval High Complexity: 1 High     PT G Codes:        Alben Deeds, PT DPT  Board Certified Neurologic Specialist [phone]   Duncan Dull 04/20/2017, 4:13 PM

## 2017-04-20 NOTE — Progress Notes (Signed)
PROGRESS NOTE  Callen Vancuren  EXB:[id number] DOB: 09-08-1960 DOA: 03/31/2017 PCP: Patient, No Pcp Per   Brief Narrative:  56 DM who [place],  taken to [facility] found to be in DKA with sepsis from uncertain source He developed kussmaul breathing requiring intubation and hemiparesis and found to have massive right MCA and PCA territory infacrts with mass effect prompting transfer to [facility] under ICU care.  He underwent decompressive right craniectomy 8/25.  Hypertonic saline was started per neurology recommendations.  He required transfusion support and was treated for AFib with RVR, continued on the ventilator, eventually receiving trach 9/5 and PEG 9/6, on ATC 9/7.   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 agalactiae PNA, then restarted on zosyn 9/1 - 9/8 due to fever, concern for aspiration.  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 Tube feeding at goal.  - Tapered hypertonic saline per neurology, no longer keeping hypernatremic.   - Sutures D/C'ed, plan to follow up with neurosurgery in 3 months to assess for cranioplasty. - Continue PT, OT, SLP, have asked for SLP to work with patient more with PMV  AFib with RVR:  Currently NSR without AV blocking agents scheduled.  - Continue cardiac  monitoring, no anticoagulation, just ASA. - Metoprolol IV prn-will consider tube dosing of Metoprolol XL  Acute anemia:  CT abdomen negative, no gross GI bleeding. EGD at time of PEG placement showed normal esophagus, stomach, duodenal bulb. Required transfusions x4.  - FOBT pending, LBM 9/8, but no sample collected. - PPI for SUP daily. - Monitor CBC daily, transfuse to maintain hgb >7 or w/bleeding.   IDDM:  Remains at inpatient goal.  -TF with glucerna -Continue detemir 34u daily, mealtime 3u + res SSI q4h.  -sugars are 120-170  Septic shock due to S. agalactiae pneumonia:  -Resolved, completed abx 8/30.   DVT prophylaxis: Aspirin Code Status: Full Family Communication: None at bedside Disposition Plan: He is stable for discharge. ? dispo as per CSW vs CM  Consultants:   PCCM  Neurology  Neurosurgery  Procedures:  LP 8/24 >> glucose 205, RBC 16, protein 62, WBC 5 CT head 8/24 >> Large Rt MCA EEG 8/27 >> bilateral cerebral dysfunction, non-specific in etiology Echo 8/27 >> EF 65 to 70% CT abd/pelvis 8/30 >> third spacing, 3.5 cm low attenuation Lt hepatic lobe CT Head 9/3>> Severe right hemisphere cytotoxic edema with stable extension of edematous brain through the right craniectomy defect. Stable petechial hemorrhage without malignant hemorrhagic transformation. Stable intracranial mass effect with leftward midline shift of 7 mm. Small left PCA territory infarct.  Cultures/antimicrobials: CSF 8/24: negative BCX 8/24: negative UCX 8/24: negative Sputum 8/24: S. Agalactiae Sputum 9/1:ormal resp. flora  Vancomycin 8/24 - 8/26 Zosyn 8/24 - 8/26, 9/1 - 9/8 Ampicillin 8/27 - 8/30  Subjective:  more communicative but able to nod in agreement or  this agreement Overall no changes  Objective: Vitals:   04/20/17 0900 04/20/17 1116 04/20/17 1200 04/20/17 1300  BP: (!) 142/128 108/71 (!) 122/110 110/68  Pulse:  (!) 108    Resp: (!) 25 (!) 28 20 (!) 26  Temp:  98.4  F (36.9 C)    TempSrc:  Axillary    SpO2:  100%    Weight:      Height:        Intake/Output Summary (Last 24 hours) at 04/20/17 1516 Last data filed at 04/20/17 0814  Gross per 24 hour  Intake           1358.5 ml  Output             1325 ml  Net             33.5 ml   Filed Weights   04/18/17 0339 04/19/17 0352 04/20/17 0300  Weight: 88 kg (194 lb 0.1 oz) 88.6 kg (195 lb 5.2 oz) 87.2 kg (192 lb 3.9 oz)   Gen: 56 y.o. male in no distress.  Pulm: Non-labored with O2 thru trach. Mild discharge  CV: Regular borderline tachycardia. No murmur, rub, or gallop. No JVD, trace pedal edema. GI: Abdomen soft, non-tender, non-distended, with normoactive bowel sounds. Ext: Warm, no deformities Skin: No rashes, lesions no ulcers. Neuro: Answers questions appropriately with nodding , no phonation. Follows commands moving legs and arm on right. Right gaze preference without movement on left side.   Psych: Unable to determine  CBC:  Recent Labs Lab 04/15/17 0245 04/16/17 0222 04/17/17 0233 04/18/17 0245 04/19/17 0420  WBC 13.9* 12.1* 13.1* 13.9* 16.3*  HGB 7.7* 7.5* 8.1* 8.7* 8.8*  HCT 26.0* 24.7* 28.0* 29.5* 29.1*  MCV 98.5 98.0 97.6 97.7 96.4  PLT 387 435* 482* 503* 761*   Basic Metabolic Panel:  Recent Labs Lab 04/14/17 0451 04/15/17 0245 04/17/17 0233 04/19/17 0420  NA 137 137 136 137  K 3.7 4.4 4.4 4.6  CL 102 101 102 105  CO2 28 27 27 26   GLUCOSE 192* 191* 148* 161*  BUN 15 16 22* 25*  CREATININE 0.79 0.90 0.71 0.78  CALCIUM 9.0 9.3 9.6 10.0   GFR: Estimated Creatinine Clearance: 106.5 mL/min (by C-G formula based on SCr of 0.78 mg/dL).  CBG:  Recent Labs Lab 04/19/17 2004 04/19/17 2357 04/20/17 0338 04/20/17 0814 04/20/17 1132  GLUCAP 153* 168* 159* 119* 165*   Urine analysis:    Component Value Date/Time   COLORURINE YELLOW (A) 03/31/2017 0945   APPEARANCEUR CLOUDY (A) 03/31/2017 0945   LABSPEC 1.018 03/31/2017 0945   PHURINE 5.0 03/31/2017 0945     GLUCOSEU 50 (A) 03/31/2017 0945   HGBUR MODERATE (A) 03/31/2017 0945   BILIRUBINUR NEGATIVE 03/31/2017 0945   KETONESUR NEGATIVE 03/31/2017 0945   PROTEINUR 30 (A) 03/31/2017 0945   NITRITE NEGATIVE 03/31/2017 0945   LEUKOCYTESUR NEGATIVE 03/31/2017 0945   Radiology Studies: No results found.   LOS: 19 days   Verneita Griffes, MD [facility] [phone]

## 2017-04-21 DIAGNOSIS — G935 Compression of brain: Secondary | ICD-10-CM

## 2017-04-21 DIAGNOSIS — Z430 Encounter for attention to tracheostomy: Secondary | ICD-10-CM

## 2017-04-21 LAB — CULTURE, FUNGUS WITHOUT SMEAR: SPECIAL REQUESTS: NORMAL

## 2017-04-21 LAB — COMPREHENSIVE METABOLIC PANEL
ALBUMIN: 2.2 g/dL — AB (ref 3.5–5.0)
ALK PHOS: 68 U/L (ref 38–126)
ALT: 25 U/L (ref 17–63)
AST: 20 U/L (ref 15–41)
Anion gap: 7 (ref 5–15)
BILIRUBIN TOTAL: 0.2 mg/dL — AB (ref 0.3–1.2)
BUN: 27 mg/dL — AB (ref 6–20)
CO2: 28 mmol/L (ref 22–32)
CREATININE: 0.85 mg/dL (ref 0.61–1.24)
Calcium: 10.1 mg/dL (ref 8.9–10.3)
Chloride: 100 mmol/L — ABNORMAL LOW (ref 101–111)
GFR calc Af Amer: >60 mL/min (ref >60)
GLUCOSE: 151 mg/dL — AB (ref 65–99)
Potassium: 4.3 mmol/L (ref 3.5–5.1)
Sodium: 135 mmol/L (ref 135–145)
TOTAL PROTEIN: 8.3 g/dL — AB (ref 6.5–8.1)

## 2017-04-21 LAB — CBC
HCT: 30.9 % — ABNORMAL LOW (ref 39.0–52.0)
Hemoglobin: 9.1 g/dL — ABNORMAL LOW (ref 13.0–17.0)
MCH: 28.4 pg (ref 26.0–34.0)
MCHC: 29.4 g/dL — ABNORMAL LOW (ref 30.0–36.0)
MCV: 96.6 fL (ref 78.0–100.0)
Platelets: 633 10*3/uL — ABNORMAL HIGH (ref 150–400)
RBC: 3.2 MIL/uL — ABNORMAL LOW (ref 4.22–5.81)
RDW: 18.1 % — ABNORMAL HIGH (ref 11.5–15.5)
WBC: 16.1 10*3/uL — ABNORMAL HIGH (ref 4.0–10.5)

## 2017-04-21 LAB — GLUCOSE, CAPILLARY
GLUCOSE-CAPILLARY: 158 mg/dL — AB (ref 65–99)
Glucose-Capillary: 137 mg/dL — ABNORMAL HIGH (ref 65–99)
Glucose-Capillary: 146 mg/dL — ABNORMAL HIGH (ref 65–99)
Glucose-Capillary: 160 mg/dL — ABNORMAL HIGH (ref 65–99)
Glucose-Capillary: 164 mg/dL — ABNORMAL HIGH (ref 65–99)
Glucose-Capillary: 168 mg/dL — ABNORMAL HIGH (ref 65–99)
Glucose-Capillary: 170 mg/dL — ABNORMAL HIGH (ref 65–99)

## 2017-04-21 LAB — PROTIME-INR
INR: 1.13
Prothrombin Time: 14.4 seconds (ref 11.4–15.2)

## 2017-04-21 MED ORDER — METOPROLOL SUCCINATE ER 25 MG PO TB24
12.5000 mg | ORAL_TABLET | Freq: Every day | ORAL | Status: DC
  Administered 2017-04-21 – 2017-04-29 (×8): 12.5 mg via ORAL
  Filled 2017-04-21 (×10): qty 1

## 2017-04-21 NOTE — Progress Notes (Signed)
CSW spoke with supervisor to have patient placed on DTP list for SNF placement.    There is a waiting list for these SNF beds and date when bed available is unsure at this time  CSW will continue to follow  Jorge Ny, [place] Social Worker [phone]

## 2017-04-21 NOTE — NC FL2 (Signed)
[place] LEVEL OF CARE SCREENING TOOL     IDENTIFICATION  Patient Name: Joe Wilkinson Birthdate: November 25, 1960 Sex: male Admission Date (Current Location): 03/31/2017  [place] and [place] Number:   [facility])   Facility and Address:  [facility] [place] [address] [place], [place] [address]      Provider Number: [id number]  Attending Physician Name and Address:  Nita Sells, MD  Relative Name and Phone Number:       Current Level of Care: Hospital Recommended Level of Care: [place] Prior Approval Number:    Date Approved/Denied:   PASRR Number: [id number]  Discharge Plan: SNF    Current Diagnoses: Patient Active Problem List   Diagnosis Date Noted  . Ventilator dependent ([place])   . Endotracheal tube present   . Acute respiratory failure ([place])   . Compromised airway   . CVA (cerebral vascular accident) ([place]) 04/01/2017  . Cytotoxic cerebral edema ([place]) 04/01/2017  . 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 DKA (diabetic ketoacidoses) ([place]) 03/30/2017  . Encounter for central line placement   . Encounter for imaging study to confirm orogastric (OG) tube placement   . Hypovolemic shock (HCC)     Orientation RESPIRATION BLADDER Height & Weight     Self, Place, Situation  Tracheostomy, O2 (47mm shiley trach will be uncuffed and at 28% FiO2 for discharge) Incontinent, External catheter Weight: 194 lb 7.1 oz (88.2 kg) Height:  5\' 10"  (177.8 cm)  BEHAVIORAL SYMPTOMS/MOOD NEUROLOGICAL BOWEL NUTRITION STATUS      Incontinent Feeding tube  AMBULATORY STATUS COMMUNICATION OF NEEDS Skin   Total Care Non-Verbally Surgical wounds                       Personal Care Assistance Level of Assistance  Bathing, Feeding, Dressing Bathing Assistance: Maximum assistance Feeding assistance: Maximum assistance Dressing Assistance: Maximum assistance     Functional Limitations Info  Speech     Speech Info:  Impaired    SPECIAL CARE FACTORS FREQUENCY  PT (By licensed PT), OT (By licensed OT), Speech therapy     PT Frequency: 5/wk OT Frequency: 5/wk     Speech Therapy Frequency: 3/wk      Contractures      Additional Factors Info  Code Status, Allergies, Insulin Sliding Scale, Suctioning Needs Code Status Info: FULL Allergies Info: NKA   Insulin Sliding Scale Info: 7/day   Suctioning Needs: patient has strong cough per RN very minimal suctioning   Current Medications (04/21/2017):  This is the current hospital active medication list Current Facility-Administered Medications  Medication Dose Route Frequency Provider Last Rate Last Dose  . acetaminophen (TYLENOL) tablet 650 mg  650 mg Per Tube Q4H PRN Rayburn, Kelly A, PA-C   650 mg at 04/21/17 0538  . aspirin tablet 325 mg  325 mg Per Tube Daily Rayburn, Kelly A, PA-C   325 mg at 04/20/17 0825  . bisacodyl (DULCOLAX) suppository 10 mg  10 mg Rectal Daily PRN Chesley Mires, MD      . chlorhexidine (PERIDEX) 0.12 % solution 15 mL  15 mL Mouth Rinse BID Patrecia Pour, MD   15 mL at 04/20/17 2239  . docusate (COLACE) 50 MG/5ML liquid 100 mg  100 mg Per Tube BID PRN Chesley Mires, MD   100 mg at 04/12/17 1359  . feeding supplement (GLUCERNA 1.2 CAL) liquid 1,000 mL  1,000 mL Per Tube Continuous Rayburn, Kelly A, PA-C 65 mL/hr  at 04/21/17 0846 1,000 mL at 04/21/17 0846  . feeding supplement (PRO-STAT SUGAR FREE 64) liquid 30 mL  30 mL Per Tube BID Rayburn, Kelly A, PA-C   30 mL at 04/20/17 2238  . fentaNYL (SUBLIMAZE) injection 12.5-100 mcg  12.5-100 mcg Intravenous Q2H PRN Raylene Miyamoto, MD   12.5 mcg at 04/14/17 1346  . Gerhardt's butt cream   Topical BID Chesley Mires, MD   1 application at 75/10/25 2238  . insulin aspart (novoLOG) injection 0-20 Units  0-20 Units Subcutaneous Q4H Chesley Mires, MD   4 Units at 04/21/17 0846  . insulin detemir (LEVEMIR) injection 34 Units  34 Units Subcutaneous Daily Mannam, Hart Robinsons, MD   34 Units at  04/20/17 0835  . ipratropium-albuterol (DUONEB) 0.5-2.5 (3) MG/3ML nebulizer solution 3 mL  3 mL Nebulization Q2H PRN Chesley Mires, MD      . MEDLINE mouth rinse  15 mL Mouth Rinse q12n4p Patrecia Pour, MD   15 mL at 04/20/17 1640  . metoprolol tartrate (LOPRESSOR) injection 5 mg  5 mg Intravenous Q4H PRN Chesley Mires, MD   5 mg at 04/11/17 1357  . ondansetron (ZOFRAN) injection 4 mg  4 mg Intravenous Q4H PRN Ditty, Kevan Ny, MD      . pantoprazole (PROTONIX) injection 40 mg  40 mg Intravenous Q24H Patrecia Pour, MD   40 mg at 04/20/17 1000  . promethazine (PHENERGAN) tablet 12.5-25 mg  12.5-25 mg Per Tube Q4H PRN Rayburn, Floyce Stakes, PA-C         Discharge Medications: Please see discharge summary for a list of discharge medications.  Relevant Imaging Results:  Relevant Lab Results:   Additional Information SS#: [id number]  Jorge Ny, LCSW

## 2017-04-21 NOTE — Progress Notes (Signed)
PULMONARY / CRITICAL CARE MEDICINE   Name: Joe Wilkinson MRN: [id number] DOB: May 04, 1961    ADMISSION DATE:  03/31/2017 CHIEF COMPLAINT:  CVA  HISTORY OF PRESENT ILLNESS:   56 yo male brought to [facility] with altered mental status from DKA and sepsis.  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  Required Rt decompressive craniectomy and remained on vent post op.  SUBJECTIVE:  Tolerating ATC well x 5 days. PMV with supervision due to secretions.Strong cough, following commands  VITAL SIGNS: BP 115/72 (BP Location: Left Arm)   Pulse 95   Temp 98.6 F (37 C) (Axillary)   Resp (!) 26   Ht 5\' 10"  (1.778 m)   Wt 194 lb 7.1 oz (88.2 kg)   SpO2 95%   BMI 27.90 kg/m   VENTILATOR SETTINGS: FiO2 (%):  [28 %-35 %] 35 %  INTAKE / OUTPUT: I/O last 3 completed shifts: In: 2528.5 [Other:130; NG/GT:2398.5] Out: 2025 [Urine:2025]  PHYSICAL EXAMINATION:  Gen:      Middle aged male of normal body habitus in NAD on ATC, awake and following commands HEENT:  Head bulges r side where flap missing, atraumatic, PERRL, no JVD Neck:      Trach c/d/I , midline and secure, clear secretions noted Lungs:   Resps even, unlabored. Few rhonchi, otherwise clear  CV:         RRR, Nl S1/S2, -M/R/G. Abd:     Soft, non-tender, non-distended, BS+ Ext:   Minimal edema, otherwise unremarkable Skin:       Warm,dry,  intact, without rash or lesions Neuro:   Awake and alert, moving right side, waved upon entering room.Following commands  BMET  Recent Labs Lab 04/17/17 0233 04/19/17 0420 04/21/17 0349  NA 136 137 135  K 4.4 4.6 4.3  CL 102 105 100*  CO2 27 26 28   BUN 22* 25* 27*  CREATININE 0.71 0.78 0.85  GLUCOSE 148* 161* 151*    Electrolytes  Recent Labs Lab 04/17/17 0233 04/19/17 0420 04/21/17 0349  CALCIUM 9.6 10.0 10.1    CBC  Recent Labs Lab 04/18/17 0245 04/19/17 0420 04/21/17 0349  WBC 13.9* 16.3* 16.1*  HGB 8.7* 8.8* 9.1*  HCT 29.5* 29.1* 30.9*  PLT 503* 578* 633*     Coag's  Recent Labs Lab 04/21/17 0349  INR 1.13    Sepsis Markers No results for input(s): LATICACIDVEN, PROCALCITON, O2SATVEN in the last 168 hours.  ABG  Recent Labs Lab 04/14/17 1742  PHART 7.519*  PCO2ART 39.6  PO2ART 60.0*    Liver Enzymes  Recent Labs Lab 04/21/17 0349  AST 20  ALT 25  ALKPHOS 68  BILITOT 0.2*  ALBUMIN 2.2*    Cardiac Enzymes No results for input(s): TROPONINI, PROBNP in the last 168 hours.  Glucose  Recent Labs Lab 04/20/17 1555 04/20/17 2003 04/20/17 2349 04/21/17 0133 04/21/17 0353 04/21/17 0836  GLUCAP 125* 167* 156* 164* 146* 160*    Imaging No results found.  STUDIES:  LP 8/24 >> glucose 205, RBC 16, protein 62, WBC 5 CT head 8/24 >> Large Rt MCA EEG 8/27 >> bilateral cerebral dysfunction, non-specific in etiology Echo 8/27 >> EF 65 to 70% CT abd/pelvis 8/30 >> third spacing, 3.5 cm low attenuation Lt hepatic lobe CT Head 9/3>> Severe right hemisphere cytotoxic edema with stable extension of edematous brain through the right craniectomy defect. Stable petechial hemorrhage without malignant hemorrhagic Transformation. Stable intracranial mass effect with leftward midline shift of 7 mm. Small left PCA territory  infarct.   CULTURES: CSF 8/24>> negative BCX 8/24>> negative UCX 8/24>> negative Sputum 8/24>> S. Agalactiae Sputum 9/1>> Consistent with normal resp. flora  ANTIBIOTICS: vanco 8/24 >> 8/26 Zosyn 8/24 >> 8/26, 9/1 >> Ampicillin 8/27 > 8/30  SIGNIFICANT EVENTS: 8/23 Admit [facility] 8/23 8/24 Transfer to [facility] after Rt MCA CVA, start 3% NS 8/25 Decompressive craniectomy 8/27 Transfuse 1 unit PRBC 8/28 Transfuse 1 unit PRBC, A fib with RVR 8/30 Transfuse 1 unit PRBC 8/31 Off pressors, reintubated x 2, transfuse PRBC 9/5 Trach 9/6 PEG  LINES/TUBES: ETT 8/23 >>9/5 Trach [name]) 9/5>>> Lt IJ CVL 8/23 >> PEG 9/6 >  ASSESSMENT / PLAN:  Acute on chronic hypoxemic respiratory failure  Small pleural  effusion - Continue ATC as tolerated - 35% Fio2 (Tolerating x 4 days  now) - Responded well to diuresis, about even for admission - Would repeat CXR prn, if vascular congestion persists, diurese further.  - Chest PT manual - Hypertonic saline nebs to aid airway clearance.  - Continued work with SLP and PMV - Consider downsize to #6 cuffed trach as pt is more alert and secretions are less of an issue   Dysphagia - continue TF - NPO per speech - SLP following  DKA: resolved  Sepsis secondary to S. Agalactiae PNA: ABX off 8/30  Septic shock - resolved   Magdalen Spatz, , AGACNP-BC [place] Pulmonology/Critical Care Pager  [phone]  04/21/2017 10:32 AM

## 2017-04-21 NOTE — Progress Notes (Signed)
PROGRESS NOTE  Joe Wilkinson  ZOX:[id number] DOB: 10/24/1960 DOA: 03/31/2017 PCP: Patient, No Pcp Per   Brief Narrative:  10 DM who [place],  taken to [facility] found to be in DKA with sepsis from uncertain source Kussmaul> intubation + hemiparesis-massive right MCA and PCA territory infacrts with mass effect prompting transfer to [facility] under ICU care.  S/p decompressive right craniectomy 8/25.  Hypertonic saline was started per neurology recommendations and has been stopped He required transfusion support and was treated for AFib with RVR, continued on the ventilator, eventually receiving trach 9/5 and PEG 9/6, on ATC 9/7.   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)   Ventilator dependent ([place])   Endotracheal tube present  Acute hypoxic and hypercarbic respiratory failure:  -Treated for S. agalactiae PNA, then restarted on zosyn 9/1 - 9/8 due to fever, concern for aspiration.  - Continue trach, prn vent,  CCM to downsize trach to allow for PMV - Keep negative balance with lasix, currently volumes even - Completed course of zosyn 9/1 - 9/8 with negative respiratory cultures, periodic labs  Massive right MCA territory infarct with PCA watershed infarct, subsequent cytotoxic edema requiring decompressive craniectomy:  - ASA per neurology - Now trach/PEG-dependent. Tube feeding at goal.  - Tapered hypertonic saline per neurology, no longer keeping hypernatremic.   - Sutures D/C'ed, plan to follow up with neurosurgery in 3 months to assess for cranioplasty. -will need significant work with skilled therapies-not a LTACH candidate--dispo hard, appreciate CSW continued efforts - SLP to work with patient more with [place] as trach downsizing 9/14  AFib with RVR, CHad2Vasc2 score=4  Currently NSR without AV blocking  agents scheduled.  - Continue cardiac monitoring, no anticoagulation, just ASA. - Metoprolol IV prn-will consider tube dosing of Metoprolol XL--changed to po metprolol xl 12.5  Acute anemia:  CT abdomen negative, no gross GI bleeding. EGD at time of PEG placement showed normal esophagus, stomach, duodenal bulb. Required transfusions x4.  - FOBT pending, LBM 9/8, but no sample collected. - PPI for SUP daily. - Monitor CBC daily, transfuse to maintain hgb >7 or w/bleeding.   IDDM:  -TF with glucerna -Continue detemir 34u daily, mealtime 3u + res SSI q4h.  -sugars are 120-170  Septic shock due to S. agalactiae pneumonia:  -Resolved, completed abx 8/30.   DVT prophylaxis: Aspirin Code Status: Full Family Communication: None at bedside Disposition Plan: He is stable for discharge. ? dispo as per CSW vs CM  Consultants:   PCCM  Neurology  Neurosurgery  SIGNIFICANT EVENTS: 8/23 Admit [facility] 8/23 8/24 Transfer to [facility] after Rt MCA CVA, start 3% NS 8/25 Decompressive craniectomy 8/27 Transfuse 1 unit PRBC 8/28 Transfuse 1 unit PRBC, A fib with RVR 8/30 Transfuse 1 unit PRBC 8/31 Off pressors, reintubated x 2, transfuse PRBC 9/5 Trach 9/6 PEG  Cultures/antimicrobials: CSF 8/24: negative BCX 8/24: negative UCX 8/24: negative Sputum 8/24: S. Agalactiae Sputum 9/1:ormal resp. flora  Vancomycin 8/24 - 8/26 Zosyn 8/24 - 8/26, 9/1 - 9/8 Ampicillin 8/27 - 8/30  Subjective:  Nodding understands my comments to him motions to want to get OOB Indicates pain in R side of head. Much more alert  Objective: Vitals:   04/21/17 0820 04/21/17 0838 04/21/17 1137 04/21/17 1301  BP:   115/75 122/80  Pulse: 95  (!) 106  Resp: (!) 22 (!) 26 20   Temp:  98.6 F (37 C)  98.1 F (36.7 C)  TempSrc:  Axillary  Oral  SpO2: 95%  95%   Weight:      Height:        Intake/Output Summary (Last 24 hours) at 04/21/17 1315 Last data filed at 04/21/17 1302  Gross per 24 hour  Intake              1885 ml  Output             1775 ml  Net              110 ml   Filed Weights   04/19/17 0352 04/20/17 0300 04/21/17 0400  Weight: 88.6 kg (195 lb 5.2 oz) 87.2 kg (192 lb 3.9 oz) 88.2 kg (194 lb 7.1 oz)   Gen: 56 y.o. male in no distress.  Pulm: Non-labored with O2 thru trach. Discharge is imporved CV: e tachycardia. No murmur, rub, or gallop. No JVD GI: Abdomen soft, non-tender, non-distended, with normoactive bowel sounds. Ext: Warm, no deformities Skin: No rashes, lesions no ulcers. Neuro: Answers questions appropriately with nodding , no phonation. Follows commands moving legs and arm on right. Right gaze preference without movement on left side.   Reflexes 3/3 L knee jerk and L brachioradialis Power o/5 on L in major muscle groups Smile twisted with droop on L Psych: Unable to determine  CBC:  Recent Labs Lab 04/16/17 0222 04/17/17 0233 04/18/17 0245 04/19/17 0420 04/21/17 0349  WBC 12.1* 13.1* 13.9* 16.3* 16.1*  HGB 7.5* 8.1* 8.7* 8.8* 9.1*  HCT 24.7* 28.0* 29.5* 29.1* 30.9*  MCV 98.0 97.6 97.7 96.4 96.6  PLT 435* 482* 503* 578* 629*   Basic Metabolic Panel:  Recent Labs Lab 04/15/17 0245 04/17/17 0233 04/19/17 0420 04/21/17 0349  NA 137 136 137 135  K 4.4 4.4 4.6 4.3  CL 101 102 105 100*  CO2 27 27 26 28   GLUCOSE 191* 148* 161* 151*  BUN 16 22* 25* 27*  CREATININE 0.90 0.71 0.78 0.85  CALCIUM 9.3 9.6 10.0 10.1   GFR: Estimated Creatinine Clearance: 108.6 mL/min (by C-G formula based on SCr of 0.85 mg/dL).  CBG:  Recent Labs Lab 04/20/17 2349 04/21/17 0133 04/21/17 0353 04/21/17 0836 04/21/17 1253  GLUCAP 156* 164* 146* 160* 168*   Urine analysis:    Component Value Date/Time   COLORURINE YELLOW (A) 03/31/2017 0945   APPEARANCEUR CLOUDY (A) 03/31/2017 0945   LABSPEC 1.018 03/31/2017 0945   PHURINE 5.0 03/31/2017 0945   GLUCOSEU 50 (A) 03/31/2017 0945   HGBUR MODERATE (A) 03/31/2017 0945   BILIRUBINUR NEGATIVE 03/31/2017 0945    KETONESUR NEGATIVE 03/31/2017 0945   PROTEINUR 30 (A) 03/31/2017 0945   NITRITE NEGATIVE 03/31/2017 0945   LEUKOCYTESUR NEGATIVE 03/31/2017 0945   Radiology Studies: No results found.   LOS: 20 days   Verneita Griffes, MD [facility] [phone]

## 2017-04-21 NOTE — Procedures (Signed)
Tracheostomy tube change: Informed verbal consent was obtained after explaining the risks (including bleeding and infection), benefits and alternatives of the procedure. Verbal timeout was performed prior to the procedure. The old  #8 cuffed trach was carefully removed. the tracheostomy site appeared: unremarkable . A new #  6 Cuffless trach was easily placed in the tracheostomy stoma and secured with velcro trach ties. The tracheostomy was patent, good color change observed via EZ-CAP,  and tolerated the procedure well with no immediate complications.    Erick Colace ACNP-BC [place] Pager # [phone] OR # [phone] if no answer

## 2017-04-21 NOTE — Progress Notes (Signed)
  Speech Language Pathology Treatment: Dysphagia;Passy Muir Speaking valve  Patient Details Name: Joe Wilkinson MRN: [id number] DOB: 04-28-1961 Today's Date: 04/21/2017 Time: 1350-1407 SLP Time Calculation (min) (ACUTE ONLY): 17 min  Assessment / Plan / Recommendation Clinical Impression  Pt performance today is impacted by c/o headache (RN made aware), although he did have his trach downsized today. This seems to improve upper airway patency as evidenced by no air trapping across same duration of PMV trial (~15 min). He produces good quality but soft phonation with Max encouragement. He prefers to communicate via gestures, and shakes his head "no" when asked to verbalize. When asked why, he points to his head, indicating pain. He did verbalize his orientation to name and situation. He needed Mod cues today for oral containment of ice chips, initiating a swallow more consistently although still with immediate coughing that follows ~75% of swallows. Recommend to continue NPO status but hopeful for readiness to participate in instrumental testing prior to d/c. Would continue PMV use intermittently with full staff supervision.   HPI HPI: 56 yo male brought to [facility] with altered mental status from DKA and sepsis. Found to have RT MCA CVA and transfer to [facility]. Required Rt decompressive craniectomy and remained on vent post op.ETT 8/23-9/5, s/p tracheostomy 9/5.       SLP Plan  Continue with current plan of care       Recommendations  Diet recommendations: NPO Medication Administration: Via alternative means      Patient may use Passy-Muir Speech Valve: Intermittently with supervision PMSV Supervision: Full         Oral Care Recommendations: Oral care QID Follow up Recommendations: Skilled Nursing facility SLP Visit Diagnosis: Dysphagia, oropharyngeal phase (R13.12);Aphonia (R49.1) Plan: Continue with current plan of care       GO                [name] 04/21/2017, 2:19  PM  [name], M.A. CCC-SLP [phone]

## 2017-04-21 NOTE — Plan of Care (Signed)
Problem: Self-Care: Goal: Ability to communicate needs accurately will improve Outcome: Not Progressing Patient unable to express needs at this time.

## 2017-04-22 ENCOUNTER — Encounter (HOSPITAL_COMMUNITY): Payer: Self-pay | Admitting: *Deleted

## 2017-04-22 ENCOUNTER — Inpatient Hospital Stay (HOSPITAL_COMMUNITY): Payer: Medicaid Other

## 2017-04-22 LAB — GLUCOSE, CAPILLARY
GLUCOSE-CAPILLARY: 145 mg/dL — AB (ref 65–99)
GLUCOSE-CAPILLARY: 156 mg/dL — AB (ref 65–99)
GLUCOSE-CAPILLARY: 170 mg/dL — AB (ref 65–99)
Glucose-Capillary: 127 mg/dL — ABNORMAL HIGH (ref 65–99)
Glucose-Capillary: 135 mg/dL — ABNORMAL HIGH (ref 65–99)
Glucose-Capillary: 132 mg/dL — ABNORMAL HIGH (ref 65–99)

## 2017-04-22 MED ORDER — ACETAMINOPHEN 500 MG PO TABS
1000.0000 mg | ORAL_TABLET | Freq: Four times a day (QID) | ORAL | Status: AC | PRN
  Administered 2017-04-22 – 2017-04-24 (×4): 1000 mg
  Filled 2017-04-22 (×4): qty 2

## 2017-04-22 MED ORDER — VANCOMYCIN HCL IN DEXTROSE 1-5 GM/200ML-% IV SOLN
1000.0000 mg | Freq: Once | INTRAVENOUS | Status: AC
  Administered 2017-04-22: 1000 mg via INTRAVENOUS
  Filled 2017-04-22: qty 200

## 2017-04-22 MED ORDER — IOPAMIDOL (ISOVUE-300) INJECTION 61%
INTRAVENOUS | Status: AC
  Administered 2017-04-22: 100 mL via INTRAVENOUS
  Filled 2017-04-22: qty 100

## 2017-04-22 MED ORDER — VANCOMYCIN HCL IN DEXTROSE 1-5 GM/200ML-% IV SOLN
1000.0000 mg | Freq: Two times a day (BID) | INTRAVENOUS | Status: DC
  Administered 2017-04-23 – 2017-04-25 (×5): 1000 mg via INTRAVENOUS
  Filled 2017-04-22 (×7): qty 200

## 2017-04-22 MED ORDER — GLYCOPYRROLATE 0.2 MG/ML IJ SOLN
0.1000 mg | Freq: Once | INTRAMUSCULAR | Status: AC
  Administered 2017-04-22: 0.1 mg via INTRAVENOUS
  Filled 2017-04-22: qty 1

## 2017-04-22 NOTE — Plan of Care (Addendum)
Problem: Pain Managment: Goal: General experience of comfort will improve Outcome: Progressing Discussed with patient about plan of care for the evening, pain management and going to CT with some teach back displayed

## 2017-04-22 NOTE — Progress Notes (Signed)
Note Rn informed me of some bed sore that now looks purulent See picutre below Lookls like a peri-rectal abcess-will ask Gen surg to eval    Verneita Griffes, MD [facility] [phone]

## 2017-04-22 NOTE — Progress Notes (Signed)
PROGRESS NOTE  Joe Wilkinson  WUJ:[id number] DOB: 14-Mar-1961 DOA: 03/31/2017 PCP: Patient, No Pcp Per   Brief Narrative:  64 DM who was [place] bus [place],  taken to [facility] found to be in DKA with sepsis from uncertain source Kussmaul> intubation + hemiparesis-massive right MCA and PCA territory infacrts with mass effect prompting transfer to [facility] under ICU care.  S/p decompressive right craniectomy 8/25.  Hypertonic saline was started per neurology recommendations and has been stopped He required transfusion support and was treated for AFib with RVR, continued on the ventilator, eventually receiving trach 9/5 and PEG 9/6, on ATC 9/7.   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 agalactiae PNA, then restarted on zosyn 9/1 - 9/8 due to fever, concern for aspiration.  - Continue trach, prn vent,  CCM to downsize trach to allow for PMV - Keep negative balance with lasix, currently volumes even - Completed course of zosyn 9/1 - 9/8 with negative respiratory cultures, periodic labs - cxr 9/15 shows low lung volumes  Massive right MCA territory infarct with PCA watershed infarct, subsequent cytotoxic edema requiring decompressive craniectomy:  - ASA per neurology - Now trach/PEG-dependent. Tube feeding at goal.  - Tapered hypertonic saline per neurology, no longer keeping hypernatremic.   - Sutures D/C'ed, plan to follow up with neurosurgery in 3 months to assess for cranioplasty. -will need significant work with skilled therapies-not a LTACH candidate--dispo hard, appreciate CSW continued efforts - SLP to work with patient more with [place] as trach  downsizing 9/14 and can use PMV  AFib with RVR, CHad2Vasc2 score=4  Currently NSR without AV blocking agents scheduled.  - Continue cardiac monitoring, no anticoagulation, just ASA. - Metoprolol IV prn-will consider tube dosing of Metoprolol XL--changed to po metprolol xl 12.5  Acute anemia: - CT abdomen negative, no gross GI bleeding. EGD at time of PEG placement showed normal esophagus, stomach, duodenal bulb. Required transfusions x4.  - FOBT pending, LBM 9/8, but no sample collected. - PPI for SUP daily. - Monitor CBC daily, transfuse to maintain hgb >7 or w/bleeding.   IDDM:  -TF with glucerna -Continue detemir 34u daily, mealtime 3u + res SSI q4h.  -sugars are 130-150  Septic shock due to S. agalactiae pneumonia:  -Resolved, completed abx 8/30.   DVT prophylaxis: Aspirin Code Status: Full Family Communication: None at bedside, called sister Disposition Plan: He is stable for discharge. ? dispo as per CSW vs CM  Consultants:   PCCM  Neurology  Neurosurgery  SIGNIFICANT EVENTS: 8/23 Admit [facility] 8/23 8/24 Transfer to [facility] after Rt MCA CVA, start 3% NS 8/25 Decompressive craniectomy 8/27 Transfuse 1 unit PRBC 8/28 Transfuse 1 unit PRBC, A fib with RVR 8/30 Transfuse 1 unit PRBC 8/31 Off pressors, reintubated x 2, transfuse PRBC 9/5 Trach 9/6 PEG  Cultures/antimicrobials: CSF 8/24: negative BCX 8/24: negative UCX 8/24: negative Sputum 8/24: S. Agalactiae Sputum 9/1:ormal resp. flora  Vancomycin 8/24 - 8/26 Zosyn 8/24 - 8/26, 9/1 - 9/8 Ampicillin 8/27 - 8/30  Subjective:  Well,  Motions with hand and legs getting TF's Asking when I ask if he has pain for pain meds for his head  Objective:  Vitals:   04/22/17 0720 04/22/17 0810 04/22/17 1137 04/22/17 1142  BP: 117/81 120/81 109/72 109/72  Pulse: 97 94 (!) 101 99  Resp: (!) 22 (!) 21 (!) 25 18  Temp: 98.7 F (37.1 C)  99.3 F (37.4 C)   TempSrc: Oral  Oral   SpO2: 100% 98% 100% 100%  Weight:        Height:        Intake/Output Summary (Last 24 hours) at 04/22/17 1430 Last data filed at 04/22/17 0423  Gross per 24 hour  Intake             1105 ml  Output              600 ml  Net              505 ml   Filed Weights   04/20/17 0300 04/21/17 0400 04/22/17 0422  Weight: 87.2 kg (192 lb 3.9 oz) 88.2 kg (194 lb 7.1 oz) 85.6 kg (188 lb 11.4 oz)   Gen: 56 y.o. male in no distress.  Pulm: Non-labored with O2 thru trach. Discharge is imporved CV: e tachycardia. No murmur, rub, or gallop. No JVD GI: Abdomen soft, non-tender, non-distended, with normoactive bowel sounds. Ext: Warm, no deformities Skin: No rashes, lesions no ulcers. Neuro: Answers questions appropriately with nodding , no phonation. . Right gaze preference without movement on left side.   Reflexes 3/3 L knee jerk and L brachioradialis Power o/5 on L in major muscle groups Smile twisted with droop on L Psych: Unable to determine  CBC:  Recent Labs Lab 04/16/17 0222 04/17/17 0233 04/18/17 0245 04/19/17 0420 04/21/17 0349  WBC 12.1* 13.1* 13.9* 16.3* 16.1*  HGB 7.5* 8.1* 8.7* 8.8* 9.1*  HCT 24.7* 28.0* 29.5* 29.1* 30.9*  MCV 98.0 97.6 97.7 96.4 96.6  PLT 435* 482* 503* 578* 976*   Basic Metabolic Panel:  Recent Labs Lab 04/17/17 0233 04/19/17 0420 04/21/17 0349  NA 136 137 135  K 4.4 4.6 4.3  CL 102 105 100*  CO2 27 26 28   GLUCOSE 148* 161* 151*  BUN 22* 25* 27*  CREATININE 0.71 0.78 0.85  CALCIUM 9.6 10.0 10.1   GFR: Estimated Creatinine Clearance: 100.2 mL/min (by C-G formula based on SCr of 0.85 mg/dL).  CBG:  Recent Labs Lab 04/21/17 2026 04/22/17 0002 04/22/17 0348 04/22/17 0719 04/22/17 1136  GLUCAP 137* 170* 145* 127* 135*   Urine analysis:    Component Value Date/Time   COLORURINE YELLOW (A) 03/31/2017 0945   APPEARANCEUR CLOUDY (A) 03/31/2017 0945   LABSPEC 1.018 03/31/2017 0945   PHURINE 5.0 03/31/2017 0945   GLUCOSEU 50 (A) 03/31/2017 0945   HGBUR MODERATE (A)  03/31/2017 0945   BILIRUBINUR NEGATIVE 03/31/2017 0945   KETONESUR NEGATIVE 03/31/2017 0945   PROTEINUR 30 (A) 03/31/2017 0945   NITRITE NEGATIVE 03/31/2017 0945   LEUKOCYTESUR NEGATIVE 03/31/2017 0945   Radiology Studies: Dg Chest Port 1 View  Result Date: 04/22/2017 CLINICAL DATA:  Followup respiratory failure. EXAM: PORTABLE CHEST 1 VIEW COMPARISON:  04/18/2017 FINDINGS: Tracheostomy remains in place. Upper lungs are clear. Mild volume loss in both lower lobes. No worsening or new findings. IMPRESSION: Persistent mild volume loss in both lower lobes. Electronically Signed   By: Nelson Chimes M.D.   On: 04/22/2017 08:25     LOS: 21 days   Verneita Griffes, MD [facility] [phone]

## 2017-04-22 NOTE — Progress Notes (Signed)
Text page to DR [facility]- noted when performing peri care post BM Bloody secretions from near anal area - pressed lightly area firm noted 3 to 4 drops of yellow secretions from area - Pt says that hurts

## 2017-04-22 NOTE — Progress Notes (Addendum)
Pharmacy Antibiotic Note  Joe Wilkinson is a 56 y.o. male  With likely  cellulitis from bed sore.  Pharmacy has been consulted for vancomycin dosing. WBC is elevated at 16.1 and Tmax 100.7. Scr 0.85 with CrCl ~ 100 ml/min.  Plan: Vancomycin 1000 mg every 12 hours  Monitor renal function, cultures, clinical s/sx of infection  Vanc trough at steady state F/U ability to narrow   Height: 5\' 10"  (177.8 cm) Weight: 188 lb 11.4 oz (85.6 kg) IBW/kg (Calculated) : 73  Temp (24hrs), Avg:99.3 F (37.4 C), Min:98.6 F (37 C), Max:100.7 F (38.2 C)   Recent Labs Lab 04/16/17 0222 04/17/17 0233 04/18/17 0245 04/19/17 0420 04/21/17 0349  WBC 12.1* 13.1* 13.9* 16.3* 16.1*  CREATININE  --  0.71  --  0.78 0.85    Estimated Creatinine Clearance: 100.2 mL/min (by C-G formula based on SCr of 0.85 mg/dL).    No Known Allergies  Antimicrobials this admission: 9/15 Vanc >>  Thank you for allowing pharmacy to be a part of this patient's care.  Susa Raring, PharmD PGY2 Infectious Diseases Pharmacy Resident Pager: [phone]  04/22/2017 4:42 PM

## 2017-04-22 NOTE — Consult Note (Signed)
CC: Evaluate for perirectal abscess  HPI: Joe Wilkinson is an 56 y.o. 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 He has had a complicated course since, including being managed for DKA, Afib/rvr, ventilator support and subsequent trach/peg. He was noted on exam today to have an area around his anus - right anterior perianal region with what sounds like fluctuance that subsequently drained spontaneously; also some redness around the area. He has been started on IV abx. He is unable to communicate his symptoms so much of this is being obtained from discussion with his primary care team and the EMR    History reviewed. No pertinent past medical history.  Past Surgical History:  Procedure Laterality Date  . CRANIECTOMY Right 04/01/2017   Procedure: RIGHT DECOMPRESSIVE CRANIECTOMY;  Surgeon: Ditty, Kevan Ny, MD;  Location: [facility];  Service: Neurosurgery;  Laterality: Right;  . ESOPHAGOGASTRODUODENOSCOPY N/A 04/13/2017   Procedure: ESOPHAGOGASTRODUODENOSCOPY (EGD);  Surgeon: Georganna Skeans, MD;  Location: [facility];  Service: General;  Laterality: N/A;  bedside  . PEG PLACEMENT N/A 04/13/2017   Procedure: PERCUTANEOUS ENDOSCOPIC GASTROSTOMY (PEG) PLACEMENT;  Surgeon: Georganna Skeans, MD;  Location: [facility] ENDOSCOPY;  Service: General;  Laterality: N/A;    No family history on file.  Social:  has no tobacco, alcohol, and drug history on file.  Allergies: No Known Allergies  Medications: I have reviewed the patient's current medications.  Results for orders placed or performed during the hospital encounter of 03/31/17 (from the past 48 hour(s))  Glucose, capillary     Status: Abnormal   Collection Time: 04/20/17  8:03 PM  Result Value Ref Range   Glucose-Capillary 167 (H) 65 - 99 mg/dL  Glucose, capillary     Status: Abnormal   Collection Time: 04/20/17  11:49 PM  Result Value Ref Range   Glucose-Capillary 156 (H) 65 - 99 mg/dL  Glucose, capillary     Status: Abnormal   Collection Time: 04/21/17  1:33 AM  Result Value Ref Range   Glucose-Capillary 164 (H) 65 - 99 mg/dL  CBC     Status: Abnormal   Collection Time: 04/21/17  3:49 AM  Result Value Ref Range   WBC 16.1 (H) 4.0 - 10.5 K/uL   RBC 3.20 (L) 4.22 - 5.81 MIL/uL   Hemoglobin 9.1 (L) 13.0 - 17.0 g/dL   HCT 30.9 (L) 39.0 - 52.0 %   MCV 96.6 78.0 - 100.0 fL   MCH 28.4 26.0 - 34.0 pg   MCHC 29.4 (L) 30.0 - 36.0 g/dL   RDW 18.1 (H) 11.5 - 15.5 %   Platelets 633 (H) 150 - 400 K/uL  Comprehensive metabolic panel     Status: Abnormal   Collection Time: 04/21/17  3:49 AM  Result Value Ref Range   Sodium 135 135 - 145 mmol/L   Potassium 4.3 3.5 - 5.1 mmol/L   Chloride 100 (L) 101 - 111 mmol/L   CO2 28 22 - 32 mmol/L   Glucose, Bld 151 (H) 65 - 99 mg/dL   BUN 27 (H) 6 - 20 mg/dL   Creatinine, Ser 0.85 0.61 - 1.24 mg/dL   Calcium 10.1 8.9 - 10.3 mg/dL   Total Protein 8.3 (H) 6.5 - 8.1 g/dL   Albumin 2.2 (L) 3.5 - 5.0 g/dL   AST 20 15 - 41 U/L   ALT 25 17 - 63 U/L   Alkaline  Phosphatase 68 38 - 126 U/L   Total Bilirubin 0.2 (L) 0.3 - 1.2 mg/dL   GFR calc non Af Amer >60 >60 mL/min   GFR calc Af Amer >60 >60 mL/min    Comment: (NOTE) The eGFR has been calculated using the CKD EPI equation. This calculation has not been validated in all clinical situations. eGFR's persistently <60 mL/min signify possible Chronic Kidney Disease.    Anion gap 7 5 - 15  Protime-INR     Status: None   Collection Time: 04/21/17  3:49 AM  Result Value Ref Range   Prothrombin Time 14.4 11.4 - 15.2 seconds   INR 1.13   Glucose, capillary     Status: Abnormal   Collection Time: 04/21/17  3:53 AM  Result Value Ref Range   Glucose-Capillary 146 (H) 65 - 99 mg/dL  Glucose, capillary     Status: Abnormal   Collection Time: 04/21/17  8:36 AM  Result Value Ref Range   Glucose-Capillary 160 (H) 65 -  99 mg/dL  Glucose, capillary     Status: Abnormal   Collection Time: 04/21/17 12:53 PM  Result Value Ref Range   Glucose-Capillary 168 (H) 65 - 99 mg/dL  Glucose, capillary     Status: Abnormal   Collection Time: 04/21/17  5:14 PM  Result Value Ref Range   Glucose-Capillary 158 (H) 65 - 99 mg/dL  Glucose, capillary     Status: Abnormal   Collection Time: 04/21/17  8:26 PM  Result Value Ref Range   Glucose-Capillary 137 (H) 65 - 99 mg/dL  Glucose, capillary     Status: Abnormal   Collection Time: 04/22/17 12:02 AM  Result Value Ref Range   Glucose-Capillary 170 (H) 65 - 99 mg/dL  Glucose, capillary     Status: Abnormal   Collection Time: 04/22/17  3:48 AM  Result Value Ref Range   Glucose-Capillary 145 (H) 65 - 99 mg/dL   Comment 1 Document in Chart   Glucose, capillary     Status: Abnormal   Collection Time: 04/22/17  7:19 AM  Result Value Ref Range   Glucose-Capillary 127 (H) 65 - 99 mg/dL  Glucose, capillary     Status: Abnormal   Collection Time: 04/22/17 11:36 AM  Result Value Ref Range   Glucose-Capillary 135 (H) 65 - 99 mg/dL  Glucose, capillary     Status: Abnormal   Collection Time: 04/22/17  3:55 PM  Result Value Ref Range   Glucose-Capillary 156 (H) 65 - 99 mg/dL    Dg Chest Port 1 View  Result Date: 04/22/2017 CLINICAL DATA:  Followup respiratory failure. EXAM: PORTABLE CHEST 1 VIEW COMPARISON:  04/18/2017 FINDINGS: Tracheostomy remains in place. Upper lungs are clear. Mild volume loss in both lower lobes. No worsening or new findings. IMPRESSION: Persistent mild volume loss in both lower lobes. Electronically Signed   By: Nelson Chimes M.D.   On: 04/22/2017 08:25    Review of Systems - unable to obtain 2/2 condition of the patient with tracheostomy and cognitive deficits  PE Blood pressure 124/81, pulse 97, temperature 99 F (37.2 C), temperature source Axillary, resp. rate (!) 21, height _0  (1.778 m), weight 85.6 kg (188 lb 11.4 oz), SpO2 97 %. Gen: NAD,  comfortable CV: RRR Pulm: Normal work of breathing GI: soft, NT/ND Rectal: Right anterior perianal skin with indurated area, no fluctuance; minimal erythema around the area. No palpable rectal masses/lesions Ext: No pitting edema Results for orders placed or performed during the hospital encounter  of 03/31/17 (from the past 48 hour(s))  Glucose, capillary     Status: Abnormal   Collection Time: 04/20/17  8:03 PM  Result Value Ref Range   Glucose-Capillary 167 (H) 65 - 99 mg/dL  Glucose, capillary     Status: Abnormal   Collection Time: 04/20/17 11:49 PM  Result Value Ref Range   Glucose-Capillary 156 (H) 65 - 99 mg/dL  Glucose, capillary     Status: Abnormal   Collection Time: 04/21/17  1:33 AM  Result Value Ref Range   Glucose-Capillary 164 (H) 65 - 99 mg/dL  CBC     Status: Abnormal   Collection Time: 04/21/17  3:49 AM  Result Value Ref Range   WBC 16.1 (H) 4.0 - 10.5 K/uL   RBC 3.20 (L) 4.22 - 5.81 MIL/uL   Hemoglobin 9.1 (L) 13.0 - 17.0 g/dL   HCT 30.9 (L) 39.0 - 52.0 %   MCV 96.6 78.0 - 100.0 fL   MCH 28.4 26.0 - 34.0 pg   MCHC 29.4 (L) 30.0 - 36.0 g/dL   RDW 18.1 (H) 11.5 - 15.5 %   Platelets 633 (H) 150 - 400 K/uL  Comprehensive metabolic panel     Status: Abnormal   Collection Time: 04/21/17  3:49 AM  Result Value Ref Range   Sodium 135 135 - 145 mmol/L   Potassium 4.3 3.5 - 5.1 mmol/L   Chloride 100 (L) 101 - 111 mmol/L   CO2 28 22 - 32 mmol/L   Glucose, Bld 151 (H) 65 - 99 mg/dL   BUN 27 (H) 6 - 20 mg/dL   Creatinine, Ser 0.85 0.61 - 1.24 mg/dL   Calcium 10.1 8.9 - 10.3 mg/dL   Total Protein 8.3 (H) 6.5 - 8.1 g/dL   Albumin 2.2 (L) 3.5 - 5.0 g/dL   AST 20 15 - 41 U/L   ALT 25 17 - 63 U/L   Alkaline Phosphatase 68 38 - 126 U/L   Total Bilirubin 0.2 (L) 0.3 - 1.2 mg/dL   GFR calc non Af Amer >60 >60 mL/min   GFR calc Af Amer >60 >60 mL/min    Comment: (NOTE) The eGFR has been calculated using the CKD EPI equation. This calculation has not been validated in  all clinical situations. eGFR's persistently <60 mL/min signify possible Chronic Kidney Disease.    Anion gap 7 5 - 15  Protime-INR     Status: None   Collection Time: 04/21/17  3:49 AM  Result Value Ref Range   Prothrombin Time 14.4 11.4 - 15.2 seconds   INR 1.13   Glucose, capillary     Status: Abnormal   Collection Time: 04/21/17  3:53 AM  Result Value Ref Range   Glucose-Capillary 146 (H) 65 - 99 mg/dL  Glucose, capillary     Status: Abnormal   Collection Time: 04/21/17  8:36 AM  Result Value Ref Range   Glucose-Capillary 160 (H) 65 - 99 mg/dL  Glucose, capillary     Status: Abnormal   Collection Time: 04/21/17 12:53 PM  Result Value Ref Range   Glucose-Capillary 168 (H) 65 - 99 mg/dL  Glucose, capillary     Status: Abnormal   Collection Time: 04/21/17  5:14 PM  Result Value Ref Range   Glucose-Capillary 158 (H) 65 - 99 mg/dL  Glucose, capillary     Status: Abnormal   Collection Time: 04/21/17  8:26 PM  Result Value Ref Range   Glucose-Capillary 137 (H) 65 - 99 mg/dL  Glucose, capillary  Status: Abnormal   Collection Time: 04/22/17 12:02 AM  Result Value Ref Range   Glucose-Capillary 170 (H) 65 - 99 mg/dL  Glucose, capillary     Status: Abnormal   Collection Time: 04/22/17  3:48 AM  Result Value Ref Range   Glucose-Capillary 145 (H) 65 - 99 mg/dL   Comment 1 Document in Chart   Glucose, capillary     Status: Abnormal   Collection Time: 04/22/17  7:19 AM  Result Value Ref Range   Glucose-Capillary 127 (H) 65 - 99 mg/dL  Glucose, capillary     Status: Abnormal   Collection Time: 04/22/17 11:36 AM  Result Value Ref Range   Glucose-Capillary 135 (H) 65 - 99 mg/dL  Glucose, capillary     Status: Abnormal   Collection Time: 04/22/17  3:55 PM  Result Value Ref Range   Glucose-Capillary 156 (H) 65 - 99 mg/dL    Dg Chest Port 1 View  Result Date: 04/22/2017 CLINICAL DATA:  Followup respiratory failure. EXAM: PORTABLE CHEST 1 VIEW COMPARISON:  04/18/2017 FINDINGS:  Tracheostomy remains in place. Upper lungs are clear. Mild volume loss in both lower lobes. No worsening or new findings. IMPRESSION: Persistent mild volume loss in both lower lobes. Electronically Signed   By: Nelson Chimes M.D.   On: 04/22/2017 08:25   A/P: 42M being managed in the MICU for CVA, acute respiratory failure, ventilator dependence - we are consulted to evaluate for possible perianal/perirectal abscess  -No areas of fluctuance on exam that would be amenable to an incision and drainage. Would obtain CT abd/pelvis with IV contrast to further evaluate -Continue broad spec IV abx - Vanc/zosyn -We will continue to follow with you  Sharon Mt. Dema Severin, M.D. [facility]

## 2017-04-22 NOTE — Progress Notes (Signed)
Changed PT water bottle and trach set up

## 2017-04-23 LAB — GLUCOSE, CAPILLARY
GLUCOSE-CAPILLARY: 147 mg/dL — AB (ref 65–99)
GLUCOSE-CAPILLARY: 148 mg/dL — AB (ref 65–99)
GLUCOSE-CAPILLARY: 136 mg/dL — AB (ref 65–99)
Glucose-Capillary: 130 mg/dL — ABNORMAL HIGH (ref 65–99)
Glucose-Capillary: 152 mg/dL — ABNORMAL HIGH (ref 65–99)

## 2017-04-23 LAB — CBC
HCT: 34.6 % — ABNORMAL LOW (ref 39.0–52.0)
HEMOGLOBIN: 10.5 g/dL — AB (ref 13.0–17.0)
MCH: 28.7 pg (ref 26.0–34.0)
MCHC: 30.3 g/dL (ref 30.0–36.0)
MCV: 94.5 fL (ref 78.0–100.0)
PLATELETS: 515 10*3/uL — AB (ref 150–400)
RBC: 3.66 MIL/uL — AB (ref 4.22–5.81)
RDW: 18.3 % — ABNORMAL HIGH (ref 11.5–15.5)
WBC: 14 10*3/uL — AB (ref 4.0–10.5)

## 2017-04-23 MED ORDER — PIPERACILLIN-TAZOBACTAM 3.375 G IVPB
3.3750 g | Freq: Three times a day (TID) | INTRAVENOUS | Status: DC
  Administered 2017-04-23 – 2017-04-25 (×8): 3.375 g via INTRAVENOUS
  Filled 2017-04-23 (×12): qty 50

## 2017-04-23 MED ORDER — PANTOPRAZOLE SODIUM 40 MG PO PACK
40.0000 mg | PACK | Freq: Every day | ORAL | Status: DC
  Administered 2017-04-24 – 2017-05-28 (×35): 40 mg
  Filled 2017-04-23 (×36): qty 20

## 2017-04-23 NOTE — Plan of Care (Signed)
Problem: Pain Managment: Goal: General experience of comfort will improve Outcome: Progressing Discussed with patient about pain management with some teach back displayed

## 2017-04-23 NOTE — Progress Notes (Signed)
PROGRESS NOTE  Joe Wilkinson  WUJ:[id number] DOB: 02-12-1961 DOA: 03/31/2017 PCP: Patient, No Pcp Per   Brief Narrative:  82 DM who was [place] un[place] bus stop,  taken to [facility] found to be in DKA with sepsis from uncertain source [place] when found> intubation + hemiparesis-massive right MCA and PCA territory infarcts with mass effect>>transfer to  ICU care.  S/p decompressive right craniectomy 8/25.  Hypertonic saline was started per neurology recommendations and has been stopped He required transfusion support and was treated for AFib with RVR, continued on the ventilator, eventually receiving trach 9/5 and PEG 9/6, on ATC 9/7.   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 agalactiae PNA, then restarted on zosyn 9/1 - 9/8 due to fever, concern for aspiration.  - Continue trach, prn vent,  CCM to downsize trach to allow for PMV - Keep negative balance with lasix, currently volumes even - Completed course of zosyn 9/1 - 9/8 with negative respiratory cultures, periodic labs - cxr 9/15 shows low lung volumes  Septic shock due to S. agalactiae pneumonia:  -Resolved, completed abx 8/30.    Peri-anal cellulitis with small abcess CT scan not detailed enough to determine if tracks into pelvis Local exam by Gen srug reveals only small extension and resolution Vanc started 9/1 See my picture from 9/15 WBC 16 adding Zosyn 9/16--local wound care  Massive right MCA territory infarct with PCA watershed infarct, subsequent cytotoxic edema requiring decompressive craniectomy:  - ASA per neurology -Now trach/PEG-dependent. Tube feeding at goal.  -Tapered  hypertonic saline per neurology, no longer keeping hypernatremic.   -Sutures D/C'ed, plan to follow up with neurosurgery in 3 months to assess for cranioplasty. -will need significant work with skilled therapies-not a LTACH candidate--dispo hard, appreciate CSW continued efforts -SLP to work with patient more with [place] as trach downsizing 9/14 and can use PMV  AFib with RVR, CHad2Vasc2 score=4  - Currently NSR without AV blocking agents scheduled.  - Continue cardiac monitoring, no anticoagulation, just ASA. - Metoprolol IV prn-will consider tube dosing of Metoprolol XL--changed to po metprolol xl 12.5  Acute anemia: - CT abdomen negative, no gross GI bleeding. EGD at time of PEG placement showed normal esophagus, stomach, duodenal bulb. Required transfusions x4.  - FOBT pending, LBM 9/8, but no sample collected. - PPI for SUP daily. - Monitor CBC daily, transfuse to maintain hgb >7 or w/bleeding.   IDDM:  -TF with glucerna -Continue detemir 34u daily, mealtime 3u + res SSI q4h.  -sugars are 130-150  DVT prophylaxis: Aspirin Code Status: Full Family Communication: None at bedside, called sister Disposition Plan: awaiting resolution abcess  Consultants:   PCCM  Neurology  Neurosurgery  gen surg  SIGNIFICANT EVENTS: 8/23 Admit [place] 8/23 8/24 Transfer to [facility] after Rt MCA CVA, start 3% NS 8/25 Decompressive craniectomy 8/27 Transfuse 1 unit PRBC 8/28 Transfuse 1 unit PRBC, A fib with RVR 8/30 Transfuse 1 unit PRBC 8/31 Off pressors, reintubated x 2, transfuse PRBC 9/5 Trach 9/6 PEG 9/15 abcess in rectal area  Cultures/antimicrobials: CSF 8/24: negative BCX 8/24: negative UCX 8/24: negative Sputum 8/24: S. Agalactiae Sputum 9/1:ormal resp. flora  Vancomycin 8/24 - 8/26  Zosyn 8/24 - 8/26, 9/1 - 9/8 Ampicillin 8/27 - 8/30 vanc 9/15 Zosyn 9/16  Subjective:  Well,  Motions with hand and legs Motioning fair with arms and leg indicates pain better in per-rectal  area  Objective: Vitals:   04/23/17 0900 04/23/17 1000 04/23/17 1140 04/23/17 1216  BP: 109/72 108/74    Pulse: (!) 102 95  95  Resp:  (!) 24  (!) 23  Temp:   98.5 F (36.9 C)   TempSrc:   Oral   SpO2: 100% 95%  94%  Weight:      Height:        Intake/Output Summary (Last 24 hours) at 04/23/17 1444 Last data filed at 04/23/17 1140  Gross per 24 hour  Intake          2220.83 ml  Output             1775 ml  Net           445.83 ml   Filed Weights   04/21/17 0400 04/22/17 0422 04/23/17 0319  Weight: 88.2 kg (194 lb 7.1 oz) 85.6 kg (188 lb 11.4 oz) 86.8 kg (191 lb 5.8 oz)   Gen: 56 y.o. male in no distress.  Pulm: Non-labored with O2 thru trach. Discharge is imporved CV: e tachycardia. No murmur, rub, or gallop. No JVD GI: Abdomen soft, non-tender, non-distended, with normoactive bowel sounds. Ext: Warm, no deformities Skin: No rashes, lesions no ulcers.  From 9/15  Neuro: Answers q nodding , no phonation.  Right gaze preference without movement on left side.   Reflexes 3/3 L knee jerk and L brachioradialis Power o/5 on L in major muscle groups Smile twisted with droop on L Psych: Unable to determine  CBC:  Recent Labs Lab 04/17/17 0233 04/18/17 0245 04/19/17 0420 04/21/17 0349 04/23/17 0904  WBC 13.1* 13.9* 16.3* 16.1* 14.0*  HGB 8.1* 8.7* 8.8* 9.1* 10.5*  HCT 28.0* 29.5* 29.1* 30.9* 34.6*  MCV 97.6 97.7 96.4 96.6 94.5  PLT 482* 503* 578* 633* 195*   Basic Metabolic Panel:  Recent Labs Lab 04/17/17 0233 04/19/17 0420 04/21/17 0349  NA 136 137 135  K 4.4 4.6 4.3  CL 102 105 100*  CO2 27 26 28   GLUCOSE 148* 161* 151*  BUN 22* 25* 27*  CREATININE 0.71 0.78 0.85  CALCIUM 9.6 10.0 10.1   GFR: Estimated Creatinine Clearance: 100.2 mL/min (by C-G formula based on SCr of 0.85 mg/dL).  CBG:  Recent Labs Lab 04/22/17 2037 04/22/17 2323 04/23/17 0336 04/23/17 0827 04/23/17 1138  GLUCAP 170* 132* 147* 130* 152*   Urine analysis:    Component  Value Date/Time   COLORURINE YELLOW (A) 03/31/2017 0945   APPEARANCEUR CLOUDY (A) 03/31/2017 0945   LABSPEC 1.018 03/31/2017 0945   PHURINE 5.0 03/31/2017 0945   GLUCOSEU 50 (A) 03/31/2017 0945   HGBUR MODERATE (A) 03/31/2017 0945   BILIRUBINUR NEGATIVE 03/31/2017 0945   KETONESUR NEGATIVE 03/31/2017 0945   PROTEINUR 30 (A) 03/31/2017 0945   NITRITE NEGATIVE 03/31/2017 0945   LEUKOCYTESUR NEGATIVE 03/31/2017 0945   Radiology Studies: Ct Abdomen Pelvis W Contrast  Result Date: 04/22/2017 CLINICAL DATA:  Perianal abscess. Status post stroke and complicated recovery. EXAM: CT ABDOMEN AND PELVIS WITH CONTRAST TECHNIQUE: Multidetector CT imaging of the abdomen and pelvis was performed using the standard protocol following bolus administration of intravenous contrast. CONTRAST:  148mL ISOVUE-300 IOPAMIDOL (ISOVUE-300) INJECTION 61% COMPARISON:  CT abdomen and pelvis April 06, 2017 FINDINGS: LOWER CHEST: Bilateral lower  lobe atelectasis. Heart size is upper limits of normal. No pericardial effusion. Included heart size is normal. No pericardial effusion. HEPATOBILIARY: 2.5 cm heterogeneously enhancing mass LEFT lobe of the liver with centripetal puddling. Liver is otherwise unremarkable. Status post cholecystectomy. PANCREAS: Normal. SPLEEN: Normal. ADRENALS/URINARY TRACT: Kidneys are orthotopic, demonstrating symmetric enhancement. No nephrolithiasis, hydronephrosis or solid renal masses. Too small to characterize hypodensities in the kidneys bilaterally. The unopacified ureters are normal in course and caliber. Delayed imaging through the kidneys demonstrates symmetric prompt contrast excretion within the proximal urinary collecting system. Urinary bladder is partially distended and unremarkable. Normal adrenal glands. STOMACH/BOWEL: The stomach, small and large bowel are normal in course and caliber without inflammatory changes, sensitivity decreased without oral contrast. Intraluminal gastrostomy  retaining bulb. Moderate colonic diverticulosis. Normal appendix. VASCULAR/LYMPHATIC: Aortoiliac vessels are normal in course and caliber. Mild calcific atherosclerosis. No lymphadenopathy by CT size criteria. Prominent inguinal lymph nodes are likely reactive. REPRODUCTIVE: Normal. OTHER: No intraperitoneal free fluid or free air. MUSCULOSKELETAL: Perianal soft tissue stranding and soft tissue density without focal fluid collection, subcutaneous gas or radiopaque foreign bodies inferior extent of process incompletely imaged. No intraperitoneal extension. Small bilateral fat containing inguinal hernias. Small fat containing umbilical hernia. Severe LEFT L5-S1 facet arthropathy. IMPRESSION: 1. Perianal cellulitis without drainable fluid collection ; inferior extent of process not imaged. 2. No acute intra-abdominal or pelvic process. 3. 2.5 cm hepatic hemangioma. Aortic Atherosclerosis (ICD10-I70.0). Electronically Signed   By: Elon Alas M.D.   On: 04/22/2017 23:37   Dg Chest Port 1 View  Result Date: 04/22/2017 CLINICAL DATA:  Followup respiratory failure. EXAM: PORTABLE CHEST 1 VIEW COMPARISON:  04/18/2017 FINDINGS: Tracheostomy remains in place. Upper lungs are clear. Mild volume loss in both lower lobes. No worsening or new findings. IMPRESSION: Persistent mild volume loss in both lower lobes. Electronically Signed   By: Nelson Chimes M.D.   On: 04/22/2017 08:25     LOS: 22 days   Verneita Griffes, MD [facility] [phone]

## 2017-04-23 NOTE — Progress Notes (Signed)
Patient ID: Joe Wilkinson, male   DOB: 1960-08-09, 56 y.o.   MRN: [id number]  [facility] Surgery Progress Note  10 Days Post-Op  Subjective: CC- perianal cellulitis/abscess NAE over night. Patient does give thumbs up when asked if he has any perianal pain. Denies abdominal pain. Tolerating TF.  Per RN patient does continue to have some drainage from perianal area.  Objective: Vital signs in last 24 hours: Temp:  [98 F (36.7 C)-99.3 F (37.4 C)] 98.8 F (37.1 C) (09/16 0700) Pulse Rate:  [92-106] 94 (09/16 0700) Resp:  [17-32] 25 (09/16 0700) BP: (94-125)/(62-91) 108/76 (09/16 0700) SpO2:  [93 %-100 %] 93 % (09/16 0700) FiO2 (%):  [28 %] 28 % (09/16 0700) Weight:  [191 lb 5.8 oz (86.8 kg)] 191 lb 5.8 oz (86.8 kg) (09/16 0319) Last BM Date: 04/22/17  Intake/Output from previous day: 09/15 0701 - 09/16 0700 In: 2145 [VF/IE:3329; IV Piggyback:200] Out: 1275 [Urine:1275] Intake/Output this shift: Total I/O In: 75.8 [NG/GT:75.8] Out: -   PE: Gen:  Alert, NAD HEENT: EOM's intact, pupils equal and round, trach Card:  tachy Pulm:  Course breath sounds bilaterally, no W/R/R, effort normal Abd: Soft, NT/ND, +BS, no HSM, no hernia Ext:  Mild edema BLE Skin: no rashes noted, warm and dry GU: right perianal area with trace erythema, no significant fluctuance but there is some purulent drainage expressed with palpation  Lab Results:   Recent Labs  04/21/17 0349  WBC 16.1*  HGB 9.1*  HCT 30.9*  PLT 633*   BMET  Recent Labs  04/21/17 0349  NA 135  K 4.3  CL 100*  CO2 28  GLUCOSE 151*  BUN 27*  CREATININE 0.85  CALCIUM 10.1   PT/INR  Recent Labs  04/21/17 0349  LABPROT 14.4  INR 1.13   CMP     Component Value Date/Time   NA 135 04/21/2017 0349   K 4.3 04/21/2017 0349   CL 100 (L) 04/21/2017 0349   CO2 28 04/21/2017 0349   GLUCOSE 151 (H) 04/21/2017 0349   BUN 27 (H) 04/21/2017 0349   CREATININE 0.85 04/21/2017 0349   CALCIUM 10.1 04/21/2017  0349   PROT 8.3 (H) 04/21/2017 0349   ALBUMIN 2.2 (L) 04/21/2017 0349   AST 20 04/21/2017 0349   ALT 25 04/21/2017 0349   ALKPHOS 68 04/21/2017 0349   BILITOT 0.2 (L) 04/21/2017 0349   GFRNONAA >60 04/21/2017 0349   GFRAA >60 04/21/2017 0349   Lipase     Component Value Date/Time   LIPASE 181 (H) 03/30/2017 1808       Studies/Results: Ct Abdomen Pelvis W Contrast  Result Date: 04/22/2017 CLINICAL DATA:  Perianal abscess. Status post stroke and complicated recovery. EXAM: CT ABDOMEN AND PELVIS WITH CONTRAST TECHNIQUE: Multidetector CT imaging of the abdomen and pelvis was performed using the standard protocol following bolus administration of intravenous contrast. CONTRAST:  165mL ISOVUE-300 IOPAMIDOL (ISOVUE-300) INJECTION 61% COMPARISON:  CT abdomen and pelvis April 06, 2017 FINDINGS: LOWER CHEST: Bilateral lower lobe atelectasis. Heart size is upper limits of normal. No pericardial effusion. Included heart size is normal. No pericardial effusion. HEPATOBILIARY: 2.5 cm heterogeneously enhancing mass LEFT lobe of the liver with centripetal puddling. Liver is otherwise unremarkable. Status post cholecystectomy. PANCREAS: Normal. SPLEEN: Normal. ADRENALS/URINARY TRACT: Kidneys are orthotopic, demonstrating symmetric enhancement. No nephrolithiasis, hydronephrosis or solid renal masses. Too small to characterize hypodensities in the kidneys bilaterally. The unopacified ureters are normal in course and caliber. Delayed imaging through the kidneys demonstrates symmetric  prompt contrast excretion within the proximal urinary collecting system. Urinary bladder is partially distended and unremarkable. Normal adrenal glands. STOMACH/BOWEL: The stomach, small and large bowel are normal in course and caliber without inflammatory changes, sensitivity decreased without oral contrast. Intraluminal gastrostomy retaining bulb. Moderate colonic diverticulosis. Normal appendix. VASCULAR/LYMPHATIC: Aortoiliac  vessels are normal in course and caliber. Mild calcific atherosclerosis. No lymphadenopathy by CT size criteria. Prominent inguinal lymph nodes are likely reactive. REPRODUCTIVE: Normal. OTHER: No intraperitoneal free fluid or free air. MUSCULOSKELETAL: Perianal soft tissue stranding and soft tissue density without focal fluid collection, subcutaneous gas or radiopaque foreign bodies inferior extent of process incompletely imaged. No intraperitoneal extension. Small bilateral fat containing inguinal hernias. Small fat containing umbilical hernia. Severe LEFT L5-S1 facet arthropathy. IMPRESSION: 1. Perianal cellulitis without drainable fluid collection ; inferior extent of process not imaged. 2. No acute intra-abdominal or pelvic process. 3. 2.5 cm hepatic hemangioma. Aortic Atherosclerosis (ICD10-I70.0). Electronically Signed   By: Elon Alas M.D.   On: 04/22/2017 23:37   Dg Chest Port 1 View  Result Date: 04/22/2017 CLINICAL DATA:  Followup respiratory failure. EXAM: PORTABLE CHEST 1 VIEW COMPARISON:  04/18/2017 FINDINGS: Tracheostomy remains in place. Upper lungs are clear. Mild volume loss in both lower lobes. No worsening or new findings. IMPRESSION: Persistent mild volume loss in both lower lobes. Electronically Signed   By: Nelson Chimes M.D.   On: 04/22/2017 08:25    Anti-infectives: Anti-infectives    Start     Dose/Rate Route Frequency Ordered Stop   04/23/17 0430  vancomycin (VANCOCIN) IVPB 1000 mg/200 mL premix     1,000 mg 200 mL/hr over 60 Minutes Intravenous Every 12 hours 04/22/17 1655     04/22/17 1630  vancomycin (VANCOCIN) IVPB 1000 mg/200 mL premix     1,000 mg 200 mL/hr over 60 Minutes Intravenous  Once 04/22/17 1627 04/22/17 1826   04/08/17 0200  piperacillin-tazobactam (ZOSYN) IVPB 3.375 g  Status:  Discontinued     3.375 g 12.5 mL/hr over 240 Minutes Intravenous Every 8 hours 04/08/17 0058 04/15/17 1601   04/08/17 0100  piperacillin-tazobactam (ZOSYN) IVPB 3.375 g   Status:  Discontinued     3.375 g 100 mL/hr over 30 Minutes Intravenous Every 6 hours 04/08/17 0054 04/08/17 0057   04/03/17 1200  ampicillin (OMNIPEN) 2 g in sodium chloride 0.9 % 50 mL IVPB     2 g 150 mL/hr over 20 Minutes Intravenous Every 6 hours 04/03/17 1005 04/06/17 1837   04/01/17 1800  vancomycin (VANCOCIN) IVPB 1000 mg/200 mL premix  Status:  Discontinued     1,000 mg 200 mL/hr over 60 Minutes Intravenous Every 24 hours 04/01/17 0146 04/01/17 0154   04/01/17 1600  ceFAZolin (ANCEF) IVPB 2g/100 mL premix     2 g 200 mL/hr over 30 Minutes Intravenous Every 8 hours 04/01/17 1100 04/02/17 0056   04/01/17 0828  bacitracin 50,000 Units in sodium chloride irrigation 0.9 % 500 mL irrigation  Status:  Discontinued       As needed 04/01/17 0837 04/01/17 0947   04/01/17 0600  piperacillin-tazobactam (ZOSYN) IVPB 3.375 g  Status:  Discontinued     3.375 g 12.5 mL/hr over 240 Minutes Intravenous Every 8 hours 04/01/17 0146 04/01/17 0154   04/01/17 0600  vancomycin (VANCOCIN) IVPB 750 mg/150 ml premix  Status:  Discontinued     750 mg 150 mL/hr over 60 Minutes Intravenous Every 12 hours 04/01/17 0225 04/03/17 0959   04/01/17 0400  piperacillin-tazobactam (ZOSYN) IVPB 3.375 g  Status:  Discontinued     3.375 g 12.5 mL/hr over 240 Minutes Intravenous Every 8 hours 04/01/17 0225 04/03/17 0959       Assessment/Plan 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 Agalactiae pneumonia - resolved  Perianal cellulitis - CT scan showed perianal cellulitis without drainable fluid collection  - afebrile  ID - currently on vancomycin 9/15>>, zosyn 9/16>> FEN - IVF, NPO, TF VTE - aspirin, SCDs Foley - in place Follow up - TBD  Plan - Perianal abscess spontaneously drained, no significant fluctuance on exam but there is persistent purulent drainage. CT scan showed cellulitis, no drainable fluid collection (although it may not have gone distal enough  to image entire process). Check CBC. Recommend continuing broad spectrum antibiotics with vancomycin/zosyn for now. Will continue to follow.   LOS: 22 days    Wellington Hampshire , [facility] Surgery 04/23/2017, 7:49 AM Pager: [phone] Consults: [phone] Mon-Fri 7:00 am-4:30 pm Sat-Sun 7:00 am-11:30 am

## 2017-04-23 NOTE — Plan of Care (Signed)
Problem: Pain Managment: Goal: General experience of comfort will improve Outcome: Progressing Discussed plan of care for the vening and pain management with some teach back displayed.  Patient only wanted sheets not blankets he was hot at the time with no temperature at this time

## 2017-04-23 NOTE — Progress Notes (Signed)
Pharmacy Antibiotic Note  Joe Wilkinson is a 56 y.o. male admitted on 03/31/2017 with cellulitis.  Pharmacy has been consulted for zosyn dosing.    Plan: Zosyn 3.375 g IV every 8 hours Monitor clinical progression, LOT  Height: 5\' 10"  (177.8 cm) Weight: 191 lb 5.8 oz (86.8 kg) IBW/kg (Calculated) : 73  Temp (24hrs), Avg:98.8 F (37.1 C), Min:98 F (36.7 C), Max:99.3 F (37.4 C)   Recent Labs Lab 04/17/17 0233 04/18/17 0245 04/19/17 0420 04/21/17 0349  WBC 13.1* 13.9* 16.3* 16.1*  CREATININE 0.71  --  0.78 0.85    Estimated Creatinine Clearance: 100.2 mL/min (by C-G formula based on SCr of 0.85 mg/dL).    No Known Allergies  Antimicrobials this admission: 8/24 Ampicillin (x3 doses) > 8/24; 8/27>>8/30 8/24 CTX x1 8/24 Vancomycin >> 8/27, 9/15>>> 8/25 Zosyn >> 8/27; 9/1>>9/8; 9/16>  Microbiology results: 8/23 MRSA PCR - neg 8/24 blood x 2 neg 8/24 csf - NGTD 8/24 resp - group B strep 8/24 Urine - NEG   [name], PharmD Pharmacy Resident Pager #: [phone] 04/23/2017 9:34 AM

## 2017-04-24 ENCOUNTER — Inpatient Hospital Stay (HOSPITAL_COMMUNITY): Payer: Medicaid Other

## 2017-04-24 LAB — CBC WITH DIFFERENTIAL/PLATELET
BASOS ABS: 0.2 10*3/uL — AB (ref 0.0–0.1)
BASOS PCT: 1 %
EOS ABS: 0.8 10*3/uL — AB (ref 0.0–0.7)
Eosinophils Relative: 5 %
HEMATOCRIT: 31.8 % — AB (ref 39.0–52.0)
Hemoglobin: 9.5 g/dL — ABNORMAL LOW (ref 13.0–17.0)
Lymphocytes Relative: 22 %
Lymphs Abs: 3.5 10*3/uL (ref 0.7–4.0)
MCH: 28.2 pg (ref 26.0–34.0)
MCHC: 29.9 g/dL — AB (ref 30.0–36.0)
MCV: 94.4 fL (ref 78.0–100.0)
Monocytes Absolute: 1.4 10*3/uL — ABNORMAL HIGH (ref 0.1–1.0)
Monocytes Relative: 9 %
NEUTROS ABS: 9.9 10*3/uL — AB (ref 1.7–7.7)
NEUTROS PCT: 63 %
Platelets: 607 10*3/uL — ABNORMAL HIGH (ref 150–400)
RBC: 3.37 MIL/uL — ABNORMAL LOW (ref 4.22–5.81)
RDW: 18.1 % — ABNORMAL HIGH (ref 11.5–15.5)
WBC: 15.9 10*3/uL — AB (ref 4.0–10.5)

## 2017-04-24 LAB — GLUCOSE, CAPILLARY
GLUCOSE-CAPILLARY: 128 mg/dL — AB (ref 65–99)
GLUCOSE-CAPILLARY: 190 mg/dL — AB (ref 65–99)
Glucose-Capillary: 144 mg/dL — ABNORMAL HIGH (ref 65–99)
Glucose-Capillary: 156 mg/dL — ABNORMAL HIGH (ref 65–99)
Glucose-Capillary: 171 mg/dL — ABNORMAL HIGH (ref 65–99)
Glucose-Capillary: 171 mg/dL — ABNORMAL HIGH (ref 65–99)

## 2017-04-24 LAB — COMPREHENSIVE METABOLIC PANEL
ALK PHOS: 67 U/L (ref 38–126)
ALT: 23 U/L (ref 17–63)
ANION GAP: 6 (ref 5–15)
AST: 23 U/L (ref 15–41)
Albumin: 2.3 g/dL — ABNORMAL LOW (ref 3.5–5.0)
BILIRUBIN TOTAL: 0.4 mg/dL (ref 0.3–1.2)
BUN: 27 mg/dL — ABNORMAL HIGH (ref 6–20)
CALCIUM: 10.1 mg/dL (ref 8.9–10.3)
CO2: 27 mmol/L (ref 22–32)
CREATININE: 0.92 mg/dL (ref 0.61–1.24)
Chloride: 101 mmol/L (ref 101–111)
Glucose, Bld: 164 mg/dL — ABNORMAL HIGH (ref 65–99)
Potassium: 4.4 mmol/L (ref 3.5–5.1)
SODIUM: 134 mmol/L — AB (ref 135–145)
TOTAL PROTEIN: 8 g/dL (ref 6.5–8.1)

## 2017-04-24 MED ORDER — GLYCOPYRROLATE 1 MG PO TABS
1.0000 mg | ORAL_TABLET | Freq: Two times a day (BID) | ORAL | Status: DC
  Administered 2017-04-24 – 2017-04-30 (×12): 1 mg via ORAL
  Filled 2017-04-24 (×13): qty 1

## 2017-04-24 MED ORDER — HYDROCORTISONE 2.5 % RE CREA
TOPICAL_CREAM | Freq: Two times a day (BID) | RECTAL | Status: DC
  Administered 2017-04-24: 1 via RECTAL
  Administered 2017-04-24 – 2017-04-25 (×2): via RECTAL
  Filled 2017-04-24: qty 28.35

## 2017-04-24 NOTE — Progress Notes (Signed)
  Speech Language Pathology Treatment: Dysphagia;Cognitive-Linquistic;Passy Muir Speaking valve  Patient Details Name: Joe Wilkinson MRN: [id number] DOB: 10-27-1960 Today's Date: 04/24/2017 Time: 2992-4268 SLP Time Calculation (min) (ACUTE ONLY): 24 min  Assessment / Plan / Recommendation Clinical Impression  Pt seen for dysphagia, PMSV and cognition; alert. Standing on pt's left side he required max verbal cues to identify SLP. Suspect neck stiffness impacting as well as left neglect. No indications of air trapping with PMV donned for 18-20 minutes; able to mobilize secretions via trach and oral cavity. Pt's vocal quality less hoarse than prior note and possibly increased intensity. Verbal cues given for deep breath and increased volume. All vitals within normal limits. Consumed ice chip trials separate from 1/2 tsp thin water with overall weak appearing swallow (elevation). No cough, throat clear or wet voice however pt remains at high risk for silent aspiration. Continue intervention.   HPI HPI: 56 yo male brought to [facility] with altered mental status from DKA and sepsis. Found to have RT MCA CVA and transfer to [facility]. Required Rt decompressive craniectomy and remained on vent post op.ETT 8/23-9/5, s/p tracheostomy 9/5.       SLP Plan  Continue with current plan of care       Recommendations  Diet recommendations: NPO      Patient may use Passy-Muir Speech Valve: Intermittently with supervision PMSV Supervision: Full MD: Please consider changing trach tube to : Smaller size         Oral Care Recommendations: Oral care QID Follow up Recommendations:  (CIR or SNF) SLP Visit Diagnosis: Cognitive communication deficit (R41.841);Aphonia (R49.1);Dysphagia, unspecified (R13.10) Plan: Continue with current plan of care       GO                Joe Wilkinson 04/24/2017, 2:29 PM   Joe Wilkinson.Ed [employer] [phone]

## 2017-04-24 NOTE — Progress Notes (Signed)
SLP Cancellation Note  Patient Details Name: Jamir Rone MRN: [id number] DOB: 02/26/1961   Cancelled treatment:       Reason Eval/Treat Not Completed: Medical issues which prohibited therapy. SLP on floor to see pt but pt had just pulled his trach out. RN, RT, and MD in room assisting. Will check back at a later time as able.   [name] 04/24/2017, 12:40 PM  [name], M.A. CCC-SLP [phone]

## 2017-04-24 NOTE — Progress Notes (Signed)
RT called stat to pt room.  Pt pulled trach out.  Pt. SPO2 94% on RA.  Replaced trach with #6 SH uncuffed.  Pt has clear BBS,  ETCO2 good color change.  Pt SPO2 98% on .35 ATC.  STAT CXR order and pending.

## 2017-04-24 NOTE — Progress Notes (Signed)
PROGRESS NOTE  Joe Wilkinson  WPY:[id number] DOB: April 18, 1961 DOA: 03/31/2017 PCP: Patient, No Pcp Per   Brief Narrative:  34 DM [place],  taken to [facility] found to be in DKA with sepsis from uncertain source Joe Wilkinson when found> intubation + hemiparesis-massive right MCA and PCA territory infarcts with mass effect>>transfer to  ICU care.  S/p decompressive right craniectomy 8/25.  Hypertonic saline was started per neurology recommendations and has been stopped He required transfusion support and was treated for AFib with RVR, continued on the ventilator, eventually receiving trach 9/5 and PEG 9/6, on ATC 9/7.   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 agalactiae PNA, then restarted on zosyn 9/1 - 9/8 due to fever, concern for aspiration.  - Continue trach, prn vent,  CCM to downsize trach to allow for PMV - Keep negative balance with lasix, currently volumes even - Completed course of zosyn 9/1 - 9/8 with negative respiratory cultures, periodic labs - cxr 9/15 shows low lung volumes -Repeat chest x-ray 9/17because of dislodged tracheotomyshowedno lung disease actively  Septic shock due to S. agalactiae pneumonia: -Resolved, completed abx 8/30.    Peri-anal cellulitis with small abcess CT scan not detailed enough to determine if tracks into pelvis Local exam by Gen surgery and myself on 9/17 shows resolution Vanc started 9/1 See my picture from 9/15and the repeat picture below 9/17 adding Zosyn 9/16 We'll attempt to D escalate to oral clindamycin in 24-48 hours white count still 16  Massive right MCA territory infarct with  PCA watershed infarct, subsequent cytotoxic edema requiring decompressive craniectomy:  - ASA per neurology -Now trach/PEG-dependent. Tube feeding at goal. Trach downsized on 9/15 to cough plus -Tapered hypertonic saline per neurology, no longer keeping hypernatremic.   -Sutures D/C'ed, plan to follow up with neurosurgery in 3 months to assess for cranioplasty. -will need significant work with skilled therapies-not a LTACH candidate, not CIR candidate--dispo hard, appreciate CSW continued efforts  AFib with RVR, CHad2Vasc2 score=4  - Currently NSR without AV blocking agents scheduled.  - Continue cardiac monitoring, no anticoagulation, just ASA. - Metoprolol IV prn-will consider tube dosing of Metoprolol XL--changed to po metprolol xl 12.5relatively rate controlled  Acute anemia: - CT abdomen negative, no gross GI bleeding. EGD at time of PEG placement showed normal esophagus, stomach, duodenal bulb. Required transfusions x4.  - FOBT pending, LBM 9/8, but no sample collected. - PPI for SUP daily. - Monitor CBC daily, transfuse to maintain hgb >7 or w/bleeding.   IDDM:  -TF with glucerna -Continue detemir 34u daily, mealtime 3u + res SSI q4h.  -sugars are 140-171  DVT prophylaxis: Aspirin Code Status: Full Family Communication: None at bedside, called sister Disposition Plan: awaiting resolution abcess  Consultants:   PCCM  Neurology  Neurosurgery  gen surg  SIGNIFICANT EVENTS: 8/23 Admit Joe Wilkinson 8/23 8/24 Transfer to [facility] after Rt MCA CVA, start 3% NS 8/25 Decompressive craniectomy 8/27 Transfuse 1 unit PRBC 8/28 Transfuse 1 unit PRBC, A fib with RVR 8/30 Transfuse 1 unit PRBC 8/31 Off pressors, reintubated x 2, transfuse PRBC 9/5 Trach 9/6 PEG 9/15 abcess in rectal area, surgery consulted  Cultures/antimicrobials:  CSF 8/24: negative BCX 8/24: negative UCX 8/24: negative Sputum 8/24: S. Agalactiae Sputum 9/1:ormal resp. flora  Vancomycin 8/24 - 8/26 Zosyn 8/24 -  8/26, 9/1 - 9/8 Ampicillin 8/27 - 8/30 vanc 9/15 Zosyn 9/16  Subjective:  Trach pulled out for some unclear reason this morning Sats without the trach were 93% he was nr in real respiratory distress but he does have copious secretions still Motions and communicates mainly with fingers  phonated somewhat with speech therapy  Objective: Vitals:   04/24/17 1100 04/24/17 1153 04/24/17 1202 04/24/17 1300  BP: 119/84 117/78 111/74 124/69  Pulse: (!) 107 (!) 107 (!) 109 (!) 110  Resp: (!) 28 (!) 24 (!) 22 (!) 22  Temp:  98.5 F (36.9 C)    TempSrc:  Oral    SpO2: 93% 95% 99% 95%  Weight:      Height:        Intake/Output Summary (Last 24 hours) at 04/24/17 1602 Last data filed at 04/24/17 1154  Gross per 24 hour  Intake             1275 ml  Output             1000 ml  Net              275 ml   Filed Weights   04/22/17 0422 04/23/17 0319 04/24/17 0153  Weight: 85.6 kg (188 lb 11.4 oz) 86.8 kg (191 lb 5.8 oz) 86.2 kg (190 lb 0.6 oz)   Gen: 56 y.o. male in no distress.  Pulm: copious secretions CV: baselinetachycardia. No murmur, rub, or gallop. No JVD GI: Abdomen soft, non-tender, non-distended, with normoactive bowel sounds. Ext: Warm, no deformities Skin: see below  From 9/17    Neuro: Answers q nodding , no phonation.  Right gaze preference without movement on left side.   Muscle power not tested Smile twisted with droop on L Psych: Unable to determine  CBC:  Recent Labs Lab 04/18/17 0245 04/19/17 0420 04/21/17 0349 04/23/17 0904 04/24/17 0424  WBC 13.9* 16.3* 16.1* 14.0* 15.9*  NEUTROABS  --   --   --   --  9.9*  HGB 8.7* 8.8* 9.1* 10.5* 9.5*  HCT 29.5* 29.1* 30.9* 34.6* 31.8*  MCV 97.7 96.4 96.6 94.5 94.4  PLT 503* 578* 633* 515* 967*   Basic Metabolic Panel:  Recent Labs Lab 04/19/17 0420 04/21/17 0349 04/24/17 0424  NA 137 135 134*  K 4.6 4.3 4.4  CL 105 100* 101  CO2 26 28 27   GLUCOSE 161* 151* 164*  BUN 25* 27* 27*  CREATININE 0.78  0.85 0.92  CALCIUM 10.0 10.1 10.1   GFR: Estimated Creatinine Clearance: 92.6 mL/min (by C-G formula based on SCr of 0.92 mg/dL).  CBG:  Recent Labs Lab 04/23/17 2009 04/24/17 0021 04/24/17 0334 04/24/17 0721 04/24/17 1152  GLUCAP 136* 171* 128* 144* 171*   Urine analysis:    Component Value Date/Time   COLORURINE YELLOW (A) 03/31/2017 0945   APPEARANCEUR CLOUDY (A) 03/31/2017 0945   LABSPEC 1.018 03/31/2017 0945   PHURINE 5.0 03/31/2017 0945   GLUCOSEU 50 (A) 03/31/2017 0945   HGBUR MODERATE (A) 03/31/2017 0945   BILIRUBINUR NEGATIVE 03/31/2017 0945   KETONESUR NEGATIVE 03/31/2017 0945   PROTEINUR 30 (A) 03/31/2017 0945   NITRITE NEGATIVE 03/31/2017 0945   LEUKOCYTESUR NEGATIVE 03/31/2017 0945   Radiology Studies: Ct Abdomen Pelvis W Contrast  Result Date: 04/22/2017 CLINICAL DATA:  Perianal abscess. Status post stroke and complicated recovery.  EXAM: CT ABDOMEN AND PELVIS WITH CONTRAST TECHNIQUE: Multidetector CT imaging of the abdomen and pelvis was performed using the standard protocol following bolus administration of intravenous contrast. CONTRAST:  156mL ISOVUE-300 IOPAMIDOL (ISOVUE-300) INJECTION 61% COMPARISON:  CT abdomen and pelvis April 06, 2017 FINDINGS: LOWER CHEST: Bilateral lower lobe atelectasis. Heart size is upper limits of normal. No pericardial effusion. Included heart size is normal. No pericardial effusion. HEPATOBILIARY: 2.5 cm heterogeneously enhancing mass LEFT lobe of the liver with centripetal puddling. Liver is otherwise unremarkable. Status post cholecystectomy. PANCREAS: Normal. SPLEEN: Normal. ADRENALS/URINARY TRACT: Kidneys are orthotopic, demonstrating symmetric enhancement. No nephrolithiasis, hydronephrosis or solid renal masses. Too small to characterize hypodensities in the kidneys bilaterally. The unopacified ureters are normal in course and caliber. Delayed imaging through the kidneys demonstrates symmetric prompt contrast excretion within  the proximal urinary collecting system. Urinary bladder is partially distended and unremarkable. Normal adrenal glands. STOMACH/BOWEL: The stomach, small and large bowel are normal in course and caliber without inflammatory changes, sensitivity decreased without oral contrast. Intraluminal gastrostomy retaining bulb. Moderate colonic diverticulosis. Normal appendix. VASCULAR/LYMPHATIC: Aortoiliac vessels are normal in course and caliber. Mild calcific atherosclerosis. No lymphadenopathy by CT size criteria. Prominent inguinal lymph nodes are likely reactive. REPRODUCTIVE: Normal. OTHER: No intraperitoneal free fluid or free air. MUSCULOSKELETAL: Perianal soft tissue stranding and soft tissue density without focal fluid collection, subcutaneous gas or radiopaque foreign bodies inferior extent of process incompletely imaged. No intraperitoneal extension. Small bilateral fat containing inguinal hernias. Small fat containing umbilical hernia. Severe LEFT L5-S1 facet arthropathy. IMPRESSION: 1. Perianal cellulitis without drainable fluid collection ; inferior extent of process not imaged. 2. No acute intra-abdominal or pelvic process. 3. 2.5 cm hepatic hemangioma. Aortic Atherosclerosis (ICD10-I70.0). Electronically Signed   By: Elon Alas M.D.   On: 04/22/2017 23:37   Dg Chest Port 1 View  Result Date: 04/24/2017 CLINICAL DATA:  Replacement of tracheostomy tube today. EXAM: PORTABLE CHEST 1 VIEW COMPARISON:  Single-view of the chest 04/22/2017. FINDINGS: Tracheostomy tube is in place with the tip in good position at the level of the clavicular heads. Lungs are clear. Heart size is normal. No pneumothorax or pleural effusion. No acute abnormality. IMPRESSION: Tracheostomy tube in good position.  No acute disease. Electronically Signed   By: Inge Rise M.D.   On: 04/24/2017 12:20     LOS: 23 days   Verneita Griffes, MD [facility] [phone]

## 2017-04-24 NOTE — Progress Notes (Signed)
Walked into patient's room- patient's trach was pulled out laying on his chest- patient was maintaining saturation at 94%- MD at bedside- Respiratory therapist placed trach back in with obturator- per Respiratory- recheck for CO2 detector was normal- STAT chest xray ordered at this time- patient's vitals stable.  Will continue to monitor.

## 2017-04-24 NOTE — Progress Notes (Signed)
Patient ID: Joe Wilkinson, male   DOB: Aug 20, 1960, 56 y.o.   MRN: [id number]  [facility] Surgery Progress Note  11 Days Post-Op  Subjective: CC- perianal cellulitis No complaints. Slept well. Denies any current pain. Per RN perianal area that was previously draining has now stopped.  Objective: Vital signs in last 24 hours: Temp:  [97.8 F (36.6 C)-98.8 F (37.1 C)] 98.5 F (36.9 C) (09/17 0722) Pulse Rate:  [86-104] 103 (09/17 0737) Resp:  [17-29] 18 (09/17 0737) BP: (100-133)/(70-99) 114/80 (09/17 0722) SpO2:  [93 %-100 %] 100 % (09/17 0737) FiO2 (%):  [28 %] 28 % (09/17 0737) Weight:  [190 lb 0.6 oz (86.2 kg)] 190 lb 0.6 oz (86.2 kg) (09/17 0153) Last BM Date: 04/22/17  Intake/Output from previous day: 09/16 0701 - 09/17 0700 In: 2160 [NG/GT:1560; IV Piggyback:600] Out: 1200 [Urine:1200] Intake/Output this shift: No intake/output data recorded.  PE: Gen:  Alert, NAD HEENT: EOM's intact, pupils equal and round, trach Card:  tachy Pulm:  effort normal Abd: Soft, NT/ND, no HSM, no hernia Ext:  Mild edema BLE Skin: no rashes noted, warm and dry GU: right perianal area with trace erythema, no fluctuance and no purulence expressed with palpation   Lab Results:   Recent Labs  04/23/17 0904 04/24/17 0424  WBC 14.0* 15.9*  HGB 10.5* 9.5*  HCT 34.6* 31.8*  PLT 515* 607*   BMET  Recent Labs  04/24/17 0424  NA 134*  K 4.4  CL 101  CO2 27  GLUCOSE 164*  BUN 27*  CREATININE 0.92  CALCIUM 10.1   PT/INR No results for input(s): LABPROT, INR in the last 72 hours. CMP     Component Value Date/Time   NA 134 (L) 04/24/2017 0424   K 4.4 04/24/2017 0424   CL 101 04/24/2017 0424   CO2 27 04/24/2017 0424   GLUCOSE 164 (H) 04/24/2017 0424   BUN 27 (H) 04/24/2017 0424   CREATININE 0.92 04/24/2017 0424   CALCIUM 10.1 04/24/2017 0424   PROT 8.0 04/24/2017 0424   ALBUMIN 2.3 (L) 04/24/2017 0424   AST 23 04/24/2017 0424   ALT 23 04/24/2017 0424   ALKPHOS 67  04/24/2017 0424   BILITOT 0.4 04/24/2017 0424   GFRNONAA >60 04/24/2017 0424   GFRAA >60 04/24/2017 0424   Lipase     Component Value Date/Time   LIPASE 181 (H) 03/30/2017 1808       Studies/Results: Ct Abdomen Pelvis W Contrast  Result Date: 04/22/2017 CLINICAL DATA:  Perianal abscess. Status post stroke and complicated recovery. EXAM: CT ABDOMEN AND PELVIS WITH CONTRAST TECHNIQUE: Multidetector CT imaging of the abdomen and pelvis was performed using the standard protocol following bolus administration of intravenous contrast. CONTRAST:  117mL ISOVUE-300 IOPAMIDOL (ISOVUE-300) INJECTION 61% COMPARISON:  CT abdomen and pelvis April 06, 2017 FINDINGS: LOWER CHEST: Bilateral lower lobe atelectasis. Heart size is upper limits of normal. No pericardial effusion. Included heart size is normal. No pericardial effusion. HEPATOBILIARY: 2.5 cm heterogeneously enhancing mass LEFT lobe of the liver with centripetal puddling. Liver is otherwise unremarkable. Status post cholecystectomy. PANCREAS: Normal. SPLEEN: Normal. ADRENALS/URINARY TRACT: Kidneys are orthotopic, demonstrating symmetric enhancement. No nephrolithiasis, hydronephrosis or solid renal masses. Too small to characterize hypodensities in the kidneys bilaterally. The unopacified ureters are normal in course and caliber. Delayed imaging through the kidneys demonstrates symmetric prompt contrast excretion within the proximal urinary collecting system. Urinary bladder is partially distended and unremarkable. Normal adrenal glands. STOMACH/BOWEL: The stomach, small and large bowel are normal  in course and caliber without inflammatory changes, sensitivity decreased without oral contrast. Intraluminal gastrostomy retaining bulb. Moderate colonic diverticulosis. Normal appendix. VASCULAR/LYMPHATIC: Aortoiliac vessels are normal in course and caliber. Mild calcific atherosclerosis. No lymphadenopathy by CT size criteria. Prominent inguinal lymph nodes  are likely reactive. REPRODUCTIVE: Normal. OTHER: No intraperitoneal free fluid or free air. MUSCULOSKELETAL: Perianal soft tissue stranding and soft tissue density without focal fluid collection, subcutaneous gas or radiopaque foreign bodies inferior extent of process incompletely imaged. No intraperitoneal extension. Small bilateral fat containing inguinal hernias. Small fat containing umbilical hernia. Severe LEFT L5-S1 facet arthropathy. IMPRESSION: 1. Perianal cellulitis without drainable fluid collection ; inferior extent of process not imaged. 2. No acute intra-abdominal or pelvic process. 3. 2.5 cm hepatic hemangioma. Aortic Atherosclerosis (ICD10-I70.0). Electronically Signed   By: Elon Alas M.D.   On: 04/22/2017 23:37    Anti-infectives: Anti-infectives    Start     Dose/Rate Route Frequency Ordered Stop   04/23/17 0945  piperacillin-tazobactam (ZOSYN) IVPB 3.375 g     3.375 g 12.5 mL/hr over 240 Minutes Intravenous Every 8 hours 04/23/17 0930     04/23/17 0430  vancomycin (VANCOCIN) IVPB 1000 mg/200 mL premix     1,000 mg 200 mL/hr over 60 Minutes Intravenous Every 12 hours 04/22/17 1655     04/22/17 1630  vancomycin (VANCOCIN) IVPB 1000 mg/200 mL premix     1,000 mg 200 mL/hr over 60 Minutes Intravenous  Once 04/22/17 1627 04/22/17 1826   04/08/17 0200  piperacillin-tazobactam (ZOSYN) IVPB 3.375 g  Status:  Discontinued     3.375 g 12.5 mL/hr over 240 Minutes Intravenous Every 8 hours 04/08/17 0058 04/15/17 1601   04/08/17 0100  piperacillin-tazobactam (ZOSYN) IVPB 3.375 g  Status:  Discontinued     3.375 g 100 mL/hr over 30 Minutes Intravenous Every 6 hours 04/08/17 0054 04/08/17 0057   04/03/17 1200  ampicillin (OMNIPEN) 2 g in sodium chloride 0.9 % 50 mL IVPB     2 g 150 mL/hr over 20 Minutes Intravenous Every 6 hours 04/03/17 1005 04/06/17 1837   04/01/17 1800  vancomycin (VANCOCIN) IVPB 1000 mg/200 mL premix  Status:  Discontinued     1,000 mg 200 mL/hr over 60  Minutes Intravenous Every 24 hours 04/01/17 0146 04/01/17 0154   04/01/17 1600  ceFAZolin (ANCEF) IVPB 2g/100 mL premix     2 g 200 mL/hr over 30 Minutes Intravenous Every 8 hours 04/01/17 1100 04/02/17 0056   04/01/17 0828  bacitracin 50,000 Units in sodium chloride irrigation 0.9 % 500 mL irrigation  Status:  Discontinued       As needed 04/01/17 0837 04/01/17 0947   04/01/17 0600  piperacillin-tazobactam (ZOSYN) IVPB 3.375 g  Status:  Discontinued     3.375 g 12.5 mL/hr over 240 Minutes Intravenous Every 8 hours 04/01/17 0146 04/01/17 0154   04/01/17 0600  vancomycin (VANCOCIN) IVPB 750 mg/150 ml premix  Status:  Discontinued     750 mg 150 mL/hr over 60 Minutes Intravenous Every 12 hours 04/01/17 0225 04/03/17 0959   04/01/17 0400  piperacillin-tazobactam (ZOSYN) IVPB 3.375 g  Status:  Discontinued     3.375 g 12.5 mL/hr over 240 Minutes Intravenous Every 8 hours 04/01/17 0225 04/03/17 0959       Assessment/Plan 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 Agalactiae pneumonia - resolved  Perianal cellulitis - CT scan showed perianal cellulitis without drainable fluid collection  - afebrile  ID - currently on  vancomycin 9/15>>day#3, zosyn 9/16>>day#2 FEN - IVF, NPO, TF VTE - aspirin, SCDs Foley - in place Follow up - no surgical follow up  Plan - Drainage has stopped and there is no fluctuance on exam. This does not need to be opened in the OR. Continue antibiotics and local wound care for cellulitis. General surgery will sign off, please call with concerns.   LOS: 23 days    Wellington Hampshire , [facility] Surgery 04/24/2017, 8:06 AM Pager: [phone] Consults: [phone] Mon-Fri 7:00 am-4:30 pm Sat-Sun 7:00 am-11:30 am

## 2017-04-24 NOTE — Progress Notes (Signed)
Physical Therapy Treatment Patient Details Name: Joe Wilkinson MRN: [id number] DOB: 11-12-1960 Today's Date: 04/24/2017    History of Present Illness 56 yo male brought to [facility] with altered mental status from DKA and sepsis.  Found to have RT MCA CVA and transfer to [facility].  Required Rt decompressive craniectomy and remained on vent post op.ETT 8/23-9/5, s/p tracheostomy 9/5    PT Comments    Pt with decreased attention requiring verbal cues to stay on task.  PROM performed LUE/LE. Pt consistently following one step commands but appears to actively be choosing to not participate in exercises RLE. Left side inattention noted. Pt on trach collar with SpO2 at 95%.    Follow Up Recommendations  SNF;Supervision/Assistance - 24 hour     Equipment Recommendations  Other (comment) (TBD)    Recommendations for Other Services       Precautions / Restrictions Precautions Precautions: Fall;Other (comment) Precaution Comments: craniectomy, no bone flap right side; trach in place    Mobility  Bed Mobility   Bed Mobility: Rolling Rolling: Max assist;Total assist            Transfers                    Ambulation/Gait                 Stairs            Wheelchair Mobility    Modified Rankin (Stroke Patients Only) Modified Rankin (Stroke Patients Only) Pre-Morbid Rankin Score: No symptoms Modified Rankin: Severe disability     Balance                                            Cognition Arousal/Alertness: Awake/alert Behavior During Therapy: Flat affect Overall Cognitive Status: Difficult to assess                     Current Attention Level: Sustained   Following Commands: Follows one step commands consistently;Follows one step commands with increased time       General Comments: L side inattention      Exercises General Exercises - Lower Extremity Ankle Circles/Pumps: Right;AROM;10 reps Heel Slides:  AROM;Right;Supine;10 reps Hip ABduction/ADduction: AAROM;Right;5 reps;Supine Straight Leg Raises: AAROM;Right;5 reps;Supine    General Comments        Pertinent Vitals/Pain Pain Assessment: Faces Faces Pain Scale: No hurt    Home Living                      Prior Function            PT Goals (current goals can now be found in the care plan section) Acute Rehab PT Goals Patient Stated Goal: none stated PT Goal Formulation: Patient unable to participate in goal setting Time For Goal Achievement: 05/04/17 Potential to Achieve Goals: Fair Progress towards PT goals: Progressing toward goals    Frequency    Min 3X/week      PT Plan Current plan remains appropriate    Co-evaluation              AM-PAC PT "6 Clicks" Daily Activity  Outcome Measure  Difficulty turning over in bed (including adjusting bedclothes, sheets and blankets)?: Unable Difficulty moving from lying on back to sitting on the side of the bed? : Unable Difficulty sitting down on and standing up  from a chair with arms (e.g., wheelchair, bedside commode, etc,.)?: Unable Help needed moving to and from a bed to chair (including a wheelchair)?: Total Help needed walking in hospital room?: Total Help needed climbing 3-5 steps with a railing? : Total 6 Click Score: 6    End of Session Equipment Utilized During Treatment: Oxygen Activity Tolerance: Patient limited by fatigue Patient left: in bed;with call bell/phone within reach;with bed alarm set Nurse Communication: Mobility status PT Visit Diagnosis: Other symptoms and signs involving the nervous system (R29.898);Hemiplegia and hemiparesis Hemiplegia - Right/Left: Left Hemiplegia - dominant/non-dominant: Non-dominant Hemiplegia - caused by: Nontraumatic intracerebral hemorrhage     Time: 3734-2876 PT Time Calculation (min) (ACUTE ONLY): 13 min  Charges:  $Therapeutic Exercise: 8-22 mins                    G Codes:       [name], PT  Office # [phone] Pager [phone]    [name] 04/24/2017, 10:02 AM

## 2017-04-24 NOTE — Progress Notes (Signed)
PULMONARY / CRITICAL CARE MEDICINE   Name: Joe Wilkinson MRN: [id number] DOB: 07-Dec-1960    ADMISSION DATE:  03/31/2017 CHIEF COMPLAINT:  CVA  HISTORY OF PRESENT ILLNESS:   56 yo male brought to [facility] with altered mental status from DKA and sepsis.  Found to have RT MCA CVA and [facility].  Required Rt decompressive craniectomy and remained on vent post op.  SUBJECTIVE:  Tolerating ATC well @ 28% FiO2.  Follows basic commands. Had perianal abscess that was draining.  CT obtained, showed cellulitis without drainable fluid collection.  Drainage resolved spontaneously.  CCS has seen and signed off, recommends continuing abx and wound care.  VITAL SIGNS: BP 100/77   Pulse 98   Temp 98.5 F (36.9 C) (Oral)   Resp (!) 22   Ht 5\' 10"  (1.778 m)   Wt 86.2 kg (190 lb 0.6 oz)   SpO2 96%   BMI 27.27 kg/m   VENTILATOR SETTINGS: FiO2 (%):  [28 %] 28 %  INTAKE / OUTPUT: I/O last 3 completed shifts: In: 32 [Other:60; NG/GT:3510; IV Piggyback:800] Out: 1825 [Urine:1825]  PHYSICAL EXAMINATION:  Gen:      Middle aged male of normal body habitus in NAD on ATC, awake and following commands HEENT:  Head bulges r side where flap missing, atraumatic, PERRL, no JVD Neck:      Trach c/d/I Lungs:   Resps even, unlabored. Clear bilaterally CV:         RRR, Nl S1/S2, -M/R/G. Abd:     Soft, non-tender, non-distended, BS+ Ext:   Minimal edema, otherwise unremarkable Skin:       Warm,dry,  intact, without rash or lesions Neuro:   Awake and alert, moving right side, Following basic commands  BMET  Recent Labs Lab 04/19/17 0420 04/21/17 0349 04/24/17 0424  NA 137 135 134*  K 4.6 4.3 4.4  CL 105 100* 101  CO2 26 28 27   BUN 25* 27* 27*  CREATININE 0.78 0.85 0.92  GLUCOSE 161* 151* 164*    Electrolytes  Recent Labs Lab 04/19/17 0420 04/21/17 0349 04/24/17 0424  CALCIUM 10.0 10.1 10.1    CBC  Recent Labs Lab 04/21/17 0349 04/23/17 0904 04/24/17 0424  WBC 16.1* 14.0* 15.9*   HGB 9.1* 10.5* 9.5*  HCT 30.9* 34.6* 31.8*  PLT 633* 515* 607*    Coag's  Recent Labs Lab 04/21/17 0349  INR 1.13    Sepsis Markers No results for input(s): LATICACIDVEN, PROCALCITON, O2SATVEN in the last 168 hours.  ABG No results for input(s): PHART, PCO2ART, PO2ART in the last 168 hours.  Liver Enzymes  Recent Labs Lab 04/21/17 0349 04/24/17 0424  AST 20 23  ALT 25 23  ALKPHOS 68 67  BILITOT 0.2* 0.4  ALBUMIN 2.2* 2.3*    Cardiac Enzymes No results for input(s): TROPONINI, PROBNP in the last 168 hours.  Glucose  Recent Labs Lab 04/23/17 1138 04/23/17 1550 04/23/17 2009 04/24/17 0021 04/24/17 0334 04/24/17 0721  GLUCAP 152* 148* 136* 171* 128* 144*    Imaging No results found.  STUDIES:  LP 8/24 >> glucose 205, RBC 16, protein 62, WBC 5 CT head 8/24 >> Large Rt MCA EEG 8/27 >> bilateral cerebral dysfunction, non-specific in etiology Echo 8/27 >> EF 65 to 70% CT abd/pelvis 8/30 >> third spacing, 3.5 cm low attenuation Lt hepatic lobe CT Head 9/3>> Severe right hemisphere cytotoxic edema with stable extension of edematous brain through the right craniectomy defect. Stable petechial hemorrhage without malignant hemorrhagic Transformation. Stable  intracranial mass effect with leftward midline shift of 7 mm. Small left PCA territory infarct.   CULTURES: CSF 8/24>> negative BCX 8/24>> negative UCX 8/24>> negative Sputum 8/24>> S. Agalactiae Sputum 9/1>> Consistent with normal resp. flora  ANTIBIOTICS: vanco 8/24 >> 8/26 Zosyn 8/24 >> 8/26, 9/1 >> Ampicillin 8/27 > 8/30  SIGNIFICANT EVENTS: 8/23 Admit [facility] 8/23 8/24 Transfer to [facility] after Rt MCA CVA, start 3% NS 8/25 Decompressive craniectomy 8/27 Transfuse 1 unit PRBC 8/28 Transfuse 1 unit PRBC, A fib with RVR 8/30 Transfuse 1 unit PRBC 8/31 Off pressors, reintubated x 2, transfuse PRBC 9/5 Trach 9/6 PEG 9/14 Trach exchanged from 8 cuffed to 6 cuffless  LINES/TUBES: ETT 8/23  >>9/5 Trach [name]) 9/5>>> Lt IJ CVL 8/23 >> PEG 9/6 >  ASSESSMENT / PLAN:  Acute on chronic hypoxemic respiratory failure  Small pleural effusion - Continue ATC as tolerated, drop to room air and maintain if SpO2 remains > 92% - Chest PT manual - Continue to work with SLP and PMV  - Intermittent CXR  Dysphagia - continue TF - NPO per speech - SLP following  DKA: resolved  Sepsis secondary to S. Agalactiae PNA: ABX off 8/30  Septic shock - resolved   Rest per primary.   Montey Hora, [place] [facility] Pager: [phone]  or [phone] 04/24/2017, 9:51 AM  STAFF NOTE: I, Merrie Roof, MD FACP have personally reviewed patient's available data, including medical history, events of note, physical examination and test results as part of my evaluation. I have discussed with resident/NP and other care providers such as pharmacist, RN and RRT. In addition, I personally evaluated patient and elicited key findings of: awake, fc simple commands, trach clean, coarse BS, cough present, wound head intact dry, pcxr I reviewed shows int infiltrates subtle, He self dc trach earlier and was placed back 6 cuffless, pcxr well placed no ptx, his secretions status and airway protection skills are not ready for decannulation planned, would continued trach collar and follow secretions status closely, active SLP assessment PMV as secretions allow  Lavon Paganini. Titus Mould, MD, [place] Pgr: [place] [facility] 04/24/2017 2:54 PM

## 2017-04-24 NOTE — Progress Notes (Addendum)
Patient has been anxious with growing restlessness throughout the shift.  She had scheduled medications which weren't effective and even a as needed Anti-Anxiety medications with no relief. Patient has stated she needs to "pee" but unable to with non-invasive measures throughout the shift her last void was at 0900 on 9//16/18 - 450 ml.  She has complained of feeling like she has to go but unable to void at this time her bladder 343 ml. Trauma paged several times with no response at this time.  PCCM called and orders being placed.

## 2017-04-24 NOTE — Consult Note (Signed)
[facility] Nurse wound consult note Reason for Consult:perirectal wound Surgery in to see patient earlier, no surgical intervention needed  Wound type: induration perianal, not fluctuant, no drainage Pressure Injury POA/No Measurement: 4cm x 2cm area of induration Wound bed: indurated tissue Drainage (amount, consistency, odor) none Periwound: intact  Dressing procedure/placement/frequency: anusol cream for pain BID Warm compresses TID  See with Dr. Verlon Au at the bedside and bedside nurse  Discussed POC with patient and bedside nurse.  Re consult if needed, will not follow at this time. Thanks  Tashae Inda [employer], RN,CWOCN, CNS, [place] [phone])

## 2017-04-24 NOTE — Progress Notes (Signed)
Rehab Admissions Coordinator Note:  Patient was screened by Retta Diones for appropriateness for an Inpatient Acute Rehab Consult.  Noted SLP recommending inpatient rehab consult.  At this time, we are recommending [facility].  If patient begins to participate more fully and shows progress, then we may be able to evaluate for CIR.  At this point he is more appropriate for SNF placement.  Retta Diones 04/24/2017, 3:15 PM  I can be reached at [phone].

## 2017-04-25 LAB — COMPREHENSIVE METABOLIC PANEL
ALBUMIN: 2.3 g/dL — AB (ref 3.5–5.0)
ALK PHOS: 66 U/L (ref 38–126)
ALT: 24 U/L (ref 17–63)
AST: 20 U/L (ref 15–41)
Anion gap: 9 (ref 5–15)
BUN: 27 mg/dL — ABNORMAL HIGH (ref 6–20)
CHLORIDE: 99 mmol/L — AB (ref 101–111)
CO2: 26 mmol/L (ref 22–32)
CREATININE: 1.02 mg/dL (ref 0.61–1.24)
Calcium: 10.3 mg/dL (ref 8.9–10.3)
GFR calc non Af Amer: >60 mL/min (ref >60)
GLUCOSE: 174 mg/dL — AB (ref 65–99)
Potassium: 4.3 mmol/L (ref 3.5–5.1)
SODIUM: 134 mmol/L — AB (ref 135–145)
Total Bilirubin: 0.3 mg/dL (ref 0.3–1.2)
Total Protein: 8.1 g/dL (ref 6.5–8.1)

## 2017-04-25 LAB — CBC WITH DIFFERENTIAL/PLATELET
BASOS ABS: 0.2 10*3/uL — AB (ref 0.0–0.1)
Basophils Relative: 1 %
EOS ABS: 0.8 10*3/uL — AB (ref 0.0–0.7)
Eosinophils Relative: 5 %
HCT: 31.9 % — ABNORMAL LOW (ref 39.0–52.0)
HEMOGLOBIN: 9.5 g/dL — AB (ref 13.0–17.0)
LYMPHS PCT: 21 %
Lymphs Abs: 3.4 10*3/uL (ref 0.7–4.0)
MCH: 28.1 pg (ref 26.0–34.0)
MCHC: 29.8 g/dL — ABNORMAL LOW (ref 30.0–36.0)
MCV: 94.4 fL (ref 78.0–100.0)
MONOS PCT: 8 %
Monocytes Absolute: 1.3 10*3/uL — ABNORMAL HIGH (ref 0.1–1.0)
NEUTROS PCT: 65 %
Neutro Abs: 10.3 10*3/uL — ABNORMAL HIGH (ref 1.7–7.7)
PLATELETS: 594 10*3/uL — AB (ref 150–400)
RBC: 3.38 MIL/uL — AB (ref 4.22–5.81)
RDW: 18.2 % — ABNORMAL HIGH (ref 11.5–15.5)
WBC: 16 10*3/uL — ABNORMAL HIGH (ref 4.0–10.5)

## 2017-04-25 LAB — GLUCOSE, CAPILLARY
GLUCOSE-CAPILLARY: 166 mg/dL — AB (ref 65–99)
GLUCOSE-CAPILLARY: 158 mg/dL — AB (ref 65–99)
Glucose-Capillary: 116 mg/dL — ABNORMAL HIGH (ref 65–99)
Glucose-Capillary: 159 mg/dL — ABNORMAL HIGH (ref 65–99)
Glucose-Capillary: 159 mg/dL — ABNORMAL HIGH (ref 65–99)
Glucose-Capillary: 164 mg/dL — ABNORMAL HIGH (ref 65–99)
Glucose-Capillary: 169 mg/dL — ABNORMAL HIGH (ref 65–99)

## 2017-04-25 LAB — VANCOMYCIN, TROUGH: VANCOMYCIN TR: 19 ug/mL (ref 15–20)

## 2017-04-25 MED ORDER — VANCOMYCIN HCL IN DEXTROSE 750-5 MG/150ML-% IV SOLN
750.0000 mg | Freq: Two times a day (BID) | INTRAVENOUS | Status: DC
  Filled 2017-04-25: qty 150

## 2017-04-25 MED ORDER — CLINDAMYCIN HCL 150 MG PO CAPS
300.0000 mg | ORAL_CAPSULE | Freq: Four times a day (QID) | ORAL | Status: AC
  Administered 2017-04-25 – 2017-04-29 (×17): 300 mg via ORAL
  Filled 2017-04-25 (×5): qty 2
  Filled 2017-04-25 (×3): qty 1
  Filled 2017-04-25: qty 2
  Filled 2017-04-25: qty 1
  Filled 2017-04-25 (×2): qty 2
  Filled 2017-04-25: qty 1
  Filled 2017-04-25 (×3): qty 2
  Filled 2017-04-25 (×2): qty 1
  Filled 2017-04-25 (×2): qty 2

## 2017-04-25 NOTE — Progress Notes (Signed)
Pharmacy Antibiotic Note Joe Wilkinson is a 56 y.o. male admitted on 03/31/2017. Currently on day 4 Zosyn and vancomycin for perianal cellulitis.   The patient's Vancomycin trough this evening resulted as slightly SUPRAtherapeutic (VT 19 mcg/ml, goal of 10-15 mcg/ml for cellulitis and superficial abscess). Will reduce the dose slightly. MD appears to be considering transition to Clindamycin in the next 24-48 hours.   The RN is already administering the 1g dose this evening - will start the lower dose on 9/19 AM.  Plan: 1. Reduce Vancomycin to 750 mg IV every 12 hours (starting on 9/19 AM) 2. Continue Zosyn 3.375g IV every 8 hours (infused over 4 hours) 3. Will continue to follow renal function, culture results, LOT, and antibiotic de-escalation plans    Height: 5\' 10"  (177.8 cm) Weight: 194 lb 14.2 oz (88.4 kg) IBW/kg (Calculated) : 73  Temp (24hrs), Avg:98.5 F (36.9 C), Min:97.7 F (36.5 C), Max:99.4 F (37.4 C)   Recent Labs Lab 04/19/17 0420 04/21/17 0349 04/23/17 0904 04/24/17 0424 04/25/17 0337 04/25/17 1640  WBC 16.3* 16.1* 14.0* 15.9* 16.0*  --   CREATININE 0.78 0.85  --  0.92 1.02  --   VANCOTROUGH  --   --   --   --   --  19    Estimated Creatinine Clearance: 90.6 mL/min (by C-G formula based on SCr of 1.02 mg/dL).    No Known Allergies  Antimicrobials this admission: 8/24 Ampicillin (x3 doses) > 8/24; 8/27>>8/30 8/24 CTX x1 8/24 Vancomycin >> 8/27, 9/15>>> 8/25 Zosyn >> 8/27; 9/1>>9/8  9/16>>  Microbiology results: 8/23 MRSA PCR - neg 8/24 blood x 2 neg 8/24 csf - NGTD 8/24 resp - group B strep 8/24 Urine - NEG 9/15 BCx: ngtd  Thank you for allowing pharmacy to be a part of this patient's care.  Alycia Rossetti, PharmD, BCPS Clinical Pharmacist Pager: [phone] 04/25/2017 5:27 PM

## 2017-04-25 NOTE — Progress Notes (Signed)
PROGRESS NOTE  Zaylyn Bergdoll  PJK:[id number] DOB: 12/26/1960 DOA: 03/31/2017 PCP: Patient, No Pcp Per   Brief Narrative:  56 DM who [place],  taken to [facility] found to be in DKA with sepsis from uncertain source [place] when found> intubation + hemiparesis-massive right MCA and PCA territory infarcts with mass effect>>transfer to  ICU care.  S/p decompressive right craniectomy 8/25.  Hypertonic saline was started per neurology recommendations and has been stopped He required transfusion support and was treated for AFib with RVR, continued on the ventilator, eventually receiving trach 9/5 and PEG 9/6, on ATC 9/7.   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 agalactiae PNA, then restarted on zosyn 9/1 - 9/8 due to fever, concern for aspiration.  -Continue trach, prn vent,  CCM to downsize trach to allow for PMV -Keep negative balance with lasix, currently volumes even -Completed course of zosyn 9/1 - 9/8 with negative respiratory cultures, periodic labs -cxr 9/15 shows low lung volumes -Repeat chest x-ray 9/17because of dislodged tracheotomy showed no lung disease actively  Septic shock due to S. agalactiae pneumonia: -Resolved, completed abx 8/30.    Peri-anal cellulitis with small abcess CT scan not detailed enough to determine if tracks into pelvis Local exam by Gen surgery and myself on 9/17 shows resolution Vanc started 9/1 See my picture from 9/15and the repeat picture below 9/17 adding Zosyn 9/16 IV abx----> oral clindamycin 9/18 Wound will need to be reviewed again 9/19 at the bedside  Massive right MCA territory infarct  with PCA watershed infarct, subsequent cytotoxic edema requiring decompressive craniectomy:  -ASA per neurology -Now trach/PEG-dependent. Tube feeding at goal. Trach downsized on 9/15 to cough plus -Tapered hypertonic saline per neurology, no longer keeping hypernatremic.   -Sutures D/C'ed, plan to follow up with neurosurgery in 3 months to assess for cranioplasty. -will need significant work with skilled therapies-not a LTACH candidate, not CIR candidate--dispo hard, appreciate CSW continued efforts -no significant changes today seems to be about the same as yesterday we are still waiting for placement  AFib with RVR, CHad2Vasc2 score=4  - Currently NSR without AV blocking agents scheduled.  - Continue cardiac monitoring, no anticoagulation, just ASA. - Metoprolol IV prn-will consider tube dosing of Metoprolol XL--changed to po metprolol xl 12.5 relatively rate controlled  Acute anemia: - CT abdomen negative, no gross GI bleeding. EGD at time of PEG placement showed normal esophagus, stomach, duodenal bulb. Required transfusions x4.  - FOBT pending, LBM 9/8, but no sample collected. - PPI for SUP daily. - Monitor CBC daily, transfuse to maintain hgb >7 or w/bleeding.   IDDM:  -TF with glucerna at 65 cc per hour -Continue detemir 34u daily, mealtime 3u + res SSI q4h.  -sugars are controlled in the 150-160 range  DVT prophylaxis: Aspirin Code Status: Full Family Communication: None at bedside, called sister Disposition Plan: awaiting resolution abcess  Consultants:   PCCM  Neurology  Neurosurgery  gen surg  SIGNIFICANT EVENTS: 8/23 Admit [place] 8/23 8/24 Transfer to [facility] after Rt MCA CVA, start 3% NS 8/25 Decompressive craniectomy 8/27 Transfuse 1 unit PRBC 8/28 Transfuse 1 unit PRBC, A fib with RVR 8/30  Transfuse 1 unit PRBC 8/31 Off pressors, reintubated x 2, transfuse PRBC 9/5 Trach 9/6 PEG 9/15 abcess in rectal area, surgery consulted  Cultures/antimicrobials: CSF  8/24: negative BCX 8/24: negative UCX 8/24: negative Sputum 8/24: S. Agalactiae Sputum 9/1:ormal resp. flora  Vancomycin 8/24 - 8/26 Zosyn 8/24 - 8/26, 9/1 - 9/8 Ampicillin 8/27 - 8/30 vanc 9/15 Zosyn 9/16  Subjective:  Trach pulled out for some unclear reason this morning Sats without the trach were 93% he was nr in real respiratory distress but he does have copious secretions still Motions and communicates mainly with fingers  phonated somewhat with speech therapy  Objective: Vitals:   04/25/17 1211 04/25/17 1243 04/25/17 1626 04/25/17 1628  BP: 124/79 124/79 121/77   Pulse: (!) 107  (!) 103 (!) 103  Resp: (!) 21 (!) 29 (!) 22 (!) 21  Temp: 98.3 F (36.8 C)  98.6 F (37 C)   TempSrc: Oral  Oral   SpO2: 99%  98% 96%  Weight:      Height:        Intake/Output Summary (Last 24 hours) at 04/25/17 1727 Last data filed at 04/25/17 1450  Gross per 24 hour  Intake              765 ml  Output             1200 ml  Net             -435 ml   Filed Weights   04/23/17 0319 04/24/17 0153 04/25/17 0412  Weight: 86.8 kg (191 lb 5.8 oz) 86.2 kg (190 lb 0.6 oz) 88.4 kg (194 lb 14.2 oz)   Gen: 56 y.o. male in no distress.  Pulm: copious secretions CV: baselinetachycardia. No murmur, rub, or gallop. No JVD GI: Abdomen soft, non-tender, non-distended, with normoactive bowel sounds. Ext: Warm, no deformities Skin: see below  From 9/17    Neuro: Answers q nodding , no phonation.  Right gaze preference without movement on left side.   Muscle power not tested Smile twisted with droop on L Psych: Unable to determine  CBC:  Recent Labs Lab 04/19/17 0420 04/21/17 0349 04/23/17 0904 04/24/17 0424 04/25/17 0337  WBC 16.3* 16.1* 14.0* 15.9* 16.0*  NEUTROABS  --   --   --  9.9* 10.3*  HGB 8.8* 9.1* 10.5* 9.5* 9.5*  HCT 29.1* 30.9* 34.6* 31.8* 31.9*  MCV 96.4 96.6 94.5 94.4 94.4  PLT 578* 633* 515* 607* 240*   Basic Metabolic Panel:  Recent Labs Lab 04/19/17 0420  04/21/17 0349 04/24/17 0424 04/25/17 0337  NA 137 135 134* 134*  K 4.6 4.3 4.4 4.3  CL 105 100* 101 99*  CO2 26 28 27 26   GLUCOSE 161* 151* 164* 174*  BUN 25* 27* 27* 27*  CREATININE 0.78 0.85 0.92 1.02  CALCIUM 10.0 10.1 10.1 10.3   GFR: Estimated Creatinine Clearance: 90.6 mL/min (by C-G formula based on SCr of 1.02 mg/dL).  CBG:  Recent Labs Lab 04/24/17 2341 04/25/17 0321 04/25/17 0724 04/25/17 1209 04/25/17 1625  GLUCAP 159* 166* 169* 159* 158*   Urine analysis:    Component Value Date/Time   COLORURINE YELLOW (A) 03/31/2017 0945   APPEARANCEUR CLOUDY (A) 03/31/2017 0945   LABSPEC 1.018 03/31/2017 0945   PHURINE 5.0 03/31/2017 0945   GLUCOSEU 50 (A) 03/31/2017 0945   HGBUR MODERATE (A) 03/31/2017 0945   BILIRUBINUR NEGATIVE 03/31/2017 0945   KETONESUR NEGATIVE 03/31/2017 0945   PROTEINUR 30 (A) 03/31/2017 0945  NITRITE NEGATIVE 03/31/2017 0945   LEUKOCYTESUR NEGATIVE 03/31/2017 0945   Radiology Studies: Dg Chest Port 1 View  Result Date: 04/24/2017 CLINICAL DATA:  Replacement of tracheostomy tube today. EXAM: PORTABLE CHEST 1 VIEW COMPARISON:  Single-view of the chest 04/22/2017. FINDINGS: Tracheostomy tube is in place with the tip in good position at the level of the clavicular heads. Lungs are clear. Heart size is normal. No pneumothorax or pleural effusion. No acute abnormality. IMPRESSION: Tracheostomy tube in good position.  No acute disease. Electronically Signed   By: Inge Rise M.D.   On: 04/24/2017 12:20     LOS: 24 days   Verneita Griffes, MD [facility] [phone]

## 2017-04-25 NOTE — Progress Notes (Signed)
CSW continuing to follow- patient remains on DTP list for SNF placement- no beds available at this time  Jorge Ny, [place] Social Worker [phone]

## 2017-04-25 NOTE — Progress Notes (Signed)
Pharmacy Antibiotic Note Joe Wilkinson is a 56 y.o. male admitted on 03/31/2017. Currently on day 4 Zosyn and vancomycin for perianal cellulitis.   Plan: 1. Continue vancomycin 1 gram IV every 12 hours 2. Obtain vancomycin trough before pm dose this evening to evaluate current dosing regimen; goal trough 15-20 3. Remain on Zosyn 3.375 grams IV every 8 hours   Height: 5\' 10"  (177.8 cm) Weight: 194 lb 14.2 oz (88.4 kg) IBW/kg (Calculated) : 73  Temp (24hrs), Avg:98.6 F (37 C), Min:97.7 F (36.5 C), Max:99.9 F (37.7 C)   Recent Labs Lab 04/19/17 0420 04/21/17 0349 04/23/17 0904 04/24/17 0424 04/25/17 0337  WBC 16.3* 16.1* 14.0* 15.9* 16.0*  CREATININE 0.78 0.85  --  0.92 1.02    Estimated Creatinine Clearance: 90.6 mL/min (by C-G formula based on SCr of 1.02 mg/dL).    No Known Allergies  Antimicrobials this admission: 8/24 Ampicillin (x3 doses) > 8/24; 8/27>>8/30 8/24 CTX x1 8/24 Vancomycin >> 8/27, 9/15>>> 8/25 Zosyn >> 8/27; 9/1>>9/8  9/16>>  Microbiology results: 8/23 MRSA PCR - neg 8/24 blood x 2 neg 8/24 csf - NGTD 8/24 resp - group B strep 8/24 Urine - NEG 9/15 BCx: ngtd  Thank you for allowing pharmacy to be a part of this patient's care.  Vincenza Hews, PharmD, BCPS 04/25/2017, 8:30 AM

## 2017-04-25 NOTE — Progress Notes (Signed)
Nutrition Follow-up  INTERVENTION:    Continue Glucerna 1.2 at goal rate of 65 ml/hr with Prostat 30 ml BID  TF regimen providing 2072 kcal, 123 grams protein, and 1255 ml free water daily  NUTRITION DIAGNOSIS:   Inadequate oral intake related to inability to eat as evidenced by NPO status, ongoing  GOAL:   Patient will meet greater than or equal to 90% of their needs, met  MONITOR:   TF tolerance, Labs, Weight trends, Skin, I & O's  ASSESSMENT:   Pt admitted to [facility] with AMS from DKA and sepsis, found to have R MCA CVA with 12 mm midline shift and transferred to [facility], required R decompressive craniectomy.   9/5 trach placed 9/6 PEG placed  Pt remains on trach collar.  Trach downsized to #6 cuffless 9/14. TF regimen: Glucerna 1.2 at 65 ml/hr. Prostat 30 ml BID via PEG tube.  Speech Path following for dysphagia, cognitive-linguistic and PMSV. Labs and medications reviewed. Na 134 (L). CBG's 166-169-159.  Diet Order:  Diet NPO time specified  Skin:  Wound (see comment) (perirectal wound)  Last BM:  9/17   Intake/Output Summary (Last 24 hours) at 04/25/17 1509 Last data filed at 04/25/17 1450  Gross per 24 hour  Intake             1015 ml  Output             1200 ml  Net             -185 ml   Height:   Ht Readings from Last 1 Encounters:  04/21/17 5' 10" (1.778 m)   Weight: >> stable  Wt Readings from Last 1 Encounters:  04/25/17 194 lb 14.2 oz (88.4 kg)  Admission weight 204 lb (92.7 kg)   Ideal Body Weight:  75.45 kg  BMI:  Body mass index is 27.96 kg/m.  Estimated Nutritional Needs:   Kcal:  2100-2300  Protein:  115-130 gm  Fluid:  2.1-2.3 L  EDUCATION NEEDS:   No education needs identified at this time  Katie , RD, LDN Pager #: [phone] After-Hours Pager #: [phone]   

## 2017-04-25 NOTE — Progress Notes (Addendum)
RT asked RN to get a spare [place] #6 cuffless trach for pt's room.  RT completed a new HOB sheet & posted in the pt's room.  0855 - Spare trach placed in pt's room.

## 2017-04-26 LAB — COMPREHENSIVE METABOLIC PANEL
ALBUMIN: 2.5 g/dL — AB (ref 3.5–5.0)
ALT: 24 U/L (ref 17–63)
AST: 20 U/L (ref 15–41)
Alkaline Phosphatase: 73 U/L (ref 38–126)
Anion gap: 6 (ref 5–15)
BUN: 29 mg/dL — AB (ref 6–20)
CHLORIDE: 102 mmol/L (ref 101–111)
CO2: 29 mmol/L (ref 22–32)
Calcium: 10.9 mg/dL — ABNORMAL HIGH (ref 8.9–10.3)
Creatinine, Ser: 0.99 mg/dL (ref 0.61–1.24)
GFR calc Af Amer: >60 mL/min (ref >60)
GFR calc non Af Amer: >60 mL/min (ref >60)
GLUCOSE: 167 mg/dL — AB (ref 65–99)
POTASSIUM: 4.4 mmol/L (ref 3.5–5.1)
SODIUM: 137 mmol/L (ref 135–145)
Total Bilirubin: 0.2 mg/dL — ABNORMAL LOW (ref 0.3–1.2)
Total Protein: 8.3 g/dL — ABNORMAL HIGH (ref 6.5–8.1)

## 2017-04-26 LAB — CBC WITH DIFFERENTIAL/PLATELET
BASOS ABS: 0.1 10*3/uL (ref 0.0–0.1)
BASOS PCT: 1 %
EOS ABS: 0.6 10*3/uL (ref 0.0–0.7)
EOS PCT: 4 %
HEMATOCRIT: 35.3 % — AB (ref 39.0–52.0)
Hemoglobin: 10.7 g/dL — ABNORMAL LOW (ref 13.0–17.0)
Lymphocytes Relative: 20 %
Lymphs Abs: 3.1 10*3/uL (ref 0.7–4.0)
MCH: 28.8 pg (ref 26.0–34.0)
MCHC: 30.3 g/dL (ref 30.0–36.0)
MCV: 95.1 fL (ref 78.0–100.0)
MONO ABS: 1.4 10*3/uL — AB (ref 0.1–1.0)
Monocytes Relative: 9 %
NEUTROS ABS: 10.7 10*3/uL — AB (ref 1.7–7.7)
Neutrophils Relative %: 66 %
PLATELETS: 560 10*3/uL — AB (ref 150–400)
RBC: 3.71 MIL/uL — ABNORMAL LOW (ref 4.22–5.81)
RDW: 17.9 % — AB (ref 11.5–15.5)
WBC: 16 10*3/uL — ABNORMAL HIGH (ref 4.0–10.5)

## 2017-04-26 LAB — GLUCOSE, CAPILLARY
GLUCOSE-CAPILLARY: 142 mg/dL — AB (ref 65–99)
GLUCOSE-CAPILLARY: 111 mg/dL — AB (ref 65–99)
GLUCOSE-CAPILLARY: 124 mg/dL — AB (ref 65–99)
GLUCOSE-CAPILLARY: 127 mg/dL — AB (ref 65–99)
Glucose-Capillary: 161 mg/dL — ABNORMAL HIGH (ref 65–99)
Glucose-Capillary: 130 mg/dL — ABNORMAL HIGH (ref 65–99)

## 2017-04-26 MED ORDER — ACETAMINOPHEN 160 MG/5ML PO SOLN
650.0000 mg | Freq: Four times a day (QID) | ORAL | Status: DC | PRN
  Administered 2017-04-26 – 2017-04-28 (×3): 650 mg
  Filled 2017-04-26 (×3): qty 20.3

## 2017-04-26 NOTE — Progress Notes (Addendum)
PROGRESS NOTE  Joe Wilkinson  VOZ:[id number] DOB: 1961-04-15 DOA: 03/31/2017 PCP: Patient, No Pcp Per  Brief Narrative:   46 DM who [place] bus [place],  taken to [facility] found to be in DKA with sepsis from uncertain source [place] when found> intubation + hemiparesis-massive right MCA and PCA territory infarcts with mass effect>>transfer to  ICU care.  S/p decompressive right craniectomy 8/25.  Hypertonic saline was started per neurology recommendations and has been stopped He required transfusion support and was treated for AFib with RVR, continued on the ventilator, eventually receiving trach 9/5 and PEG 9/6, on ATC 9/7.   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 agalactiae PNA, then restarted on zosyn 9/1 - 9/8 due to fever, concern for aspiration.  -Continue trach, prn vent,  CCM to downsize trach to allow for PMV -Keep negative balance with lasix, currently volumes even -Completed course of zosyn 9/1 - 9/8 with negative respiratory cultures, periodic labs -cxr 9/15 shows low lung volumes -Repeat chest x-ray 9/17 because of dislodged tracheotomy, showed no lung disease actively  Septic shock due to S. agalactiae pneumonia: -Resolved, completed abx 8/30.    Peri-anal cellulitis with small abcess, almost completely resolved CT scan not detailed enough to determine if tracks into pelvis Gen surgery evaluated and determined no surgical drainage necessary -  Continue clindamycin through 9/22, then stop  Massive right MCA territory infarct with PCA watershed infarct, subsequent cytotoxic edema requiring decompressive craniectomy:    -ASA per neurology -Now trach/PEG-dependent. Tube feeding at goal. Trach downsized on 9/15 to cough plus -Tapered hypertonic saline per neurology, no longer keeping hypernatremic.   -Sutures D/C'ed, plan to follow up with neurosurgery in 3 months to assess for cranioplasty -Not an LTACH candidate, not CIR candidate -  No insurance and has craniotomy and has not undergone cranioplasty.   -  Appreciate CSW continued efforts  AFib with RVR, CHad2Vasc2 score=4, likely due to acute illness - has maintained NSR - Continue ASA. - Continue metprolol xl 12.5 daily -  Okay to d/c telemetry and transfer to floor  Acute anemia: - CT abdomen negative, no gross GI bleeding. EGD at time of PEG placement showed normal esophagus, stomach, duodenal bulb. Required transfusions x4.  - FOBT pending, LBM 9/8, but no sample collected. - PPI for SUP daily. - Monitor CBC daily, transfuse to maintain hgb >7 or w/bleeding.   IDDM:  -TF with glucerna at 65 cc per hour -Continue detemir 34u daily and high dose SSI q4h.  -sugars are controlled in the 130-160 range  DVT prophylaxis: SCDs Code Status: Full Family Communication: None at bedside  Disposition Plan: awaiting placement at SNF  Consultants:   PCCM  Neurology  Neurosurgery  gen surg  SIGNIFICANT EVENTS: 8/23 Admit [facility] 8/23 8/24 Transfer to [facility] after Rt MCA CVA, start 3% NS 8/25 Decompressive craniectomy 8/27 Transfuse 1 unit PRBC 8/28 Transfuse 1 unit PRBC, A fib with RVR 8/30 Transfuse 1 unit PRBC 8/31 Off pressors, reintubated x 2, transfuse PRBC 9/5 Trach 9/6 PEG 9/15 abcess in rectal area, surgery consulted  Cultures/antimicrobials: CSF 8/24: negative BCX 8/24: negative UCX 8/24: negative Sputum 8/24:  S. Agalactiae Sputum 9/1: Normal resp. flora  Vancomycin 8/24 - 8/26 Zosyn 8/24 - 8/26, 9/1 - 9/8 Ampicillin 8/27 - 8/30 vanc 9/15 9/18 Zosyn 9/16 9/18 Clindamycin 9/18   Antimicrobials:  Anti-infectives     Start     Dose/Rate Route Frequency Ordered Stop   04/26/17 0600  vancomycin (VANCOCIN) IVPB 750 mg/150 ml premix  Status:  Discontinued     750 mg 150 mL/hr over 60 Minutes Intravenous Every 12 hours 04/25/17 1729 04/25/17 1730   04/25/17 1830  clindamycin (CLEOCIN) capsule 300 mg     300 mg Oral Every 6 hours 04/25/17 1730 04/29/17 2359   04/25/17 1800  vancomycin (VANCOCIN) IVPB 750 mg/150 ml premix  Status:  Discontinued     750 mg 150 mL/hr over 60 Minutes Intravenous Every 12 hours 04/25/17 1728 04/25/17 1729   04/23/17 0945  piperacillin-tazobactam (ZOSYN) IVPB 3.375 g  Status:  Discontinued     3.375 g 12.5 mL/hr over 240 Minutes Intravenous Every 8 hours 04/23/17 0930 04/25/17 1730   04/23/17 0430  vancomycin (VANCOCIN) IVPB 1000 mg/200 mL premix  Status:  Discontinued     1,000 mg 200 mL/hr over 60 Minutes Intravenous Every 12 hours 04/22/17 1655 04/25/17 1728   04/22/17 1630  vancomycin (VANCOCIN) IVPB 1000 mg/200 mL premix     1,000 mg 200 mL/hr over 60 Minutes Intravenous  Once 04/22/17 1627 04/22/17 1826   04/08/17 0200  piperacillin-tazobactam (ZOSYN) IVPB 3.375 g  Status:  Discontinued     3.375 g 12.5 mL/hr over 240 Minutes Intravenous Every 8 hours 04/08/17 0058 04/15/17 1601   04/08/17 0100  piperacillin-tazobactam (ZOSYN) IVPB 3.375 g  Status:  Discontinued     3.375 g 100 mL/hr over 30 Minutes Intravenous Every 6 hours 04/08/17 0054 04/08/17 0057   04/03/17 1200  ampicillin (OMNIPEN) 2 g in sodium chloride 0.9 % 50 mL IVPB     2 g 150 mL/hr over 20 Minutes Intravenous Every 6 hours 04/03/17 1005 04/06/17 1837   04/01/17 1800  vancomycin (VANCOCIN) IVPB 1000 mg/200 mL premix  Status:  Discontinued     1,000 mg 200 mL/hr over 60 Minutes Intravenous Every 24 hours 04/01/17 0146 04/01/17 0154   04/01/17 1600  ceFAZolin (ANCEF) IVPB 2g/100 mL premix     2 g 200 mL/hr over 30 Minutes Intravenous Every 8 hours 04/01/17 1100 04/02/17 0056   04/01/17 0828  bacitracin  50,000 Units in sodium chloride irrigation 0.9 % 500 mL irrigation  Status:  Discontinued       As needed 04/01/17 0837 04/01/17 0947   04/01/17 0600  piperacillin-tazobactam (ZOSYN) IVPB 3.375 g  Status:  Discontinued     3.375 g 12.5 mL/hr over 240 Minutes Intravenous Every 8 hours 04/01/17 0146 04/01/17 0154   04/01/17 0600  vancomycin (VANCOCIN) IVPB 750 mg/150 ml premix  Status:  Discontinued     750 mg 150 mL/hr over 60 Minutes Intravenous Every 12 hours 04/01/17 0225 04/03/17 0959   04/01/17 0400  piperacillin-tazobactam (ZOSYN) IVPB 3.375 g  Status:  Discontinued     3.375 g 12.5 mL/hr over 240 Minutes Intravenous Every 8 hours 04/01/17 0225 04/03/17 0959       Subjective:  Patient combines of 10 out of 10 pain along the right neck, right scalp, right posterior scalp.  Has been present for two days.  Denies difficulty breathing, cough, chest pains, new obvious weakness.    Objective: Vitals:   04/26/17 1200 04/26/17 1300 04/26/17 1400 04/26/17  1620  BP: 115/75 114/74 108/71 122/72  Pulse: (!) 102 96 93 96  Resp: 20 (!) 21 19 18   Temp: 98.6 F (37 C)     TempSrc: Axillary     SpO2: 98% 99% 98% 99%  Weight:      Height:        Intake/Output Summary (Last 24 hours) at 04/26/17 1644 Last data filed at 04/26/17 1228  Gross per 24 hour  Intake              780 ml  Output             1425 ml  Net             -645 ml   Filed Weights   04/24/17 0153 04/25/17 0412 04/26/17 0416  Weight: 86.2 kg (190 lb 0.6 oz) 88.4 kg (194 lb 14.2 oz) 85.5 kg (188 lb 7.9 oz)    Examination:  General exam:  Adult male, clearly preferring the right side.  No acute distress.  HEENT:  Swollen right temporal/parietal/occipital scalp area where craniotomy was performed,  TTP along the right neck, posterior right neck without erythema, mass, muscle spasm, MMM.  ATC 28%  Respiratory system: rhonchorous bilateral breath sounds, but no focal rales or wheeze Cardiovascular system: Regular rate  and rhythm, normal S1/S2. No murmurs, rubs, gallops or clicks.  Warm extremities Gastrointestinal system: Normal active bowel sounds, soft, nondistended, nontender.  PEG tube in place with tube feeds running MSK:  Normal tone and bulk, no lower extremity edema Neuro:  Left hemineglect, left hemiparesis.   Perianal area: minimal swelling and induration, but no fluctuance, erythema.      Data Reviewed: I have personally reviewed following labs and imaging studies  CBC:  Recent Labs Lab 04/21/17 0349 04/23/17 0904 04/24/17 0424 04/25/17 0337 04/26/17 0340  WBC 16.1* 14.0* 15.9* 16.0* 16.0*  NEUTROABS  --   --  9.9* 10.3* 10.7*  HGB 9.1* 10.5* 9.5* 9.5* 10.7*  HCT 30.9* 34.6* 31.8* 31.9* 35.3*  MCV 96.6 94.5 94.4 94.4 95.1  PLT 633* 515* 607* 594* 681*   Basic Metabolic Panel:  Recent Labs Lab 04/21/17 0349 04/24/17 0424 04/25/17 0337 04/26/17 0340  NA 135 134* 134* 137  K 4.3 4.4 4.3 4.4  CL 100* 101 99* 102  CO2 28 27 26 29   GLUCOSE 151* 164* 174* 167*  BUN 27* 27* 27* 29*  CREATININE 0.85 0.92 1.02 0.99  CALCIUM 10.1 10.1 10.3 10.9*   GFR: Estimated Creatinine Clearance: 86 mL/min (by C-G formula based on SCr of 0.99 mg/dL). Liver Function Tests:  Recent Labs Lab 04/21/17 0349 04/24/17 0424 04/25/17 0337 04/26/17 0340  AST 20 23 20 20   ALT 25 23 24 24   ALKPHOS 68 67 66 73  BILITOT 0.2* 0.4 0.3 0.2*  PROT 8.3* 8.0 8.1 8.3*  ALBUMIN 2.2* 2.3* 2.3* 2.5*   No results for input(s): LIPASE, AMYLASE in the last 168 hours. No results for input(s): AMMONIA in the last 168 hours. Coagulation Profile:  Recent Labs Lab 04/21/17 0349  INR 1.13   Cardiac Enzymes: No results for input(s): CKTOTAL, CKMB, CKMBINDEX, TROPONINI in the last 168 hours. BNP (last 3 results) No results for input(s): PROBNP in the last 8760 hours. HbA1C: No results for input(s): HGBA1C in the last 72 hours. CBG:  Recent Labs Lab 04/25/17 2001 04/25/17 2326 04/26/17 0342  04/26/17 0801 04/26/17 1230  GLUCAP 116* 164* 161* 130* 142*   Lipid Profile: No results for  input(s): CHOL, HDL, LDLCALC, TRIG, CHOLHDL, LDLDIRECT in the last 72 hours. Thyroid Function Tests: No results for input(s): TSH, T4TOTAL, FREET4, T3FREE, THYROIDAB in the last 72 hours. Anemia Panel: No results for input(s): VITAMINB12, FOLATE, FERRITIN, TIBC, IRON, RETICCTPCT in the last 72 hours. Urine analysis:    Component Value Date/Time   COLORURINE YELLOW (A) 03/31/2017 0945   APPEARANCEUR CLOUDY (A) 03/31/2017 0945   LABSPEC 1.018 03/31/2017 0945   PHURINE 5.0 03/31/2017 0945   GLUCOSEU 50 (A) 03/31/2017 0945   HGBUR MODERATE (A) 03/31/2017 0945   BILIRUBINUR NEGATIVE 03/31/2017 0945   KETONESUR NEGATIVE 03/31/2017 0945   PROTEINUR 30 (A) 03/31/2017 0945   NITRITE NEGATIVE 03/31/2017 0945   LEUKOCYTESUR NEGATIVE 03/31/2017 0945   Sepsis Labs: @LABRCNTIP (procalcitonin:4,lacticidven:4)  ) Recent Results (from the past 240 hour(s))  Culture, blood (routine x 2)     Status: None (Preliminary result)   Collection Time: 04/22/17  4:50 PM  Result Value Ref Range Status   Specimen Description BLOOD LEFT HAND  Final   Special Requests IN PEDIATRIC BOTTLE Blood Culture adequate volume  Final   Culture NO GROWTH 4 DAYS  Final   Report Status PENDING  Incomplete  Culture, blood (routine x 2)     Status: None (Preliminary result)   Collection Time: 04/22/17  4:57 PM  Result Value Ref Range Status   Specimen Description BLOOD LEFT HAND  Final   Special Requests IN PEDIATRIC BOTTLE Blood Culture adequate volume  Final   Culture NO GROWTH 4 DAYS  Final   Report Status PENDING  Incomplete      Radiology Studies: No results found.   Scheduled Meds: . aspirin  325 mg Per Tube Daily  . chlorhexidine  15 mL Mouth Rinse BID  . clindamycin  300 mg Oral Q6H  . feeding supplement (PRO-STAT SUGAR FREE 64)  30 mL Per Tube BID  . glycopyrrolate  1 mg Oral BID  . insulin aspart  0-20  Units Subcutaneous Q4H  . insulin detemir  34 Units Subcutaneous Daily  . mouth rinse  15 mL Mouth Rinse q12n4p  . metoprolol succinate  12.5 mg Oral Daily  . pantoprazole sodium  40 mg Per Tube Daily   Continuous Infusions: . feeding supplement (GLUCERNA 1.2 CAL) 1,000 mL (04/26/17 0656)     LOS: 25 days    Time spent: 30 min    Janece Canterbury, MD [facility] Pager [phone]  If 7PM-7AM, please contact night-coverage [email or web address] Password [facility] 04/26/2017, 4:44 PM

## 2017-04-26 NOTE — Progress Notes (Signed)
Report given to 5C nurse 

## 2017-04-26 NOTE — Progress Notes (Signed)
Physical Therapy Treatment Patient Details Name: Joe Wilkinson MRN: [id number] DOB: [birth year] Today's Date: 04/26/2017    History of Present Illness 56 yo male brought to [facility] with altered mental status from DKA and sepsis.  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  Required Rt decompressive craniectomy and remained on vent post op.ETT 8/23-9/5, s/p tracheostomy 9/5    PT Comments    Pt very cooperative and willing to participate in therapy. Pt tolerated sitting EOB x 10 minutes with max assist. L inattention noted with eyes not crossing midline. Pt able to follow one-step commands. He fatigues quickly. Pt return to supine in bed at end of session.    Follow Up Recommendations  SNF;Supervision/Assistance - 24 hour     Equipment Recommendations  Other (comment) (TBD)    Recommendations for Other Services       Precautions / Restrictions Precautions Precautions: Fall;Other (comment) Precaution Comments: craniectomy, no bone flap right side; trach in place    Mobility  Bed Mobility     Rolling: Max assist;Total assist   Supine to sit: Max assist;+2 for physical assistance;HOB elevated Sit to supine: +2 for physical assistance;Total assist   General bed mobility comments: helicopter technique to bring patient to EOB, increased time and effort  Transfers                 General transfer comment: unable  Ambulation/Gait             General Gait Details: unable   Stairs            Wheelchair Mobility    Modified Rankin (Stroke Patients Only) Modified Rankin (Stroke Patients Only) Pre-Morbid Rankin Score: No symptoms Modified Rankin: Severe disability     Balance   Sitting-balance support: Feet supported;Single extremity supported Sitting balance-Leahy Scale: Zero Sitting balance - Comments: max assist for sitting balance EOB x 10 minutes. Head positioned in right side flexion and rotation. L inattention                                     Cognition Arousal/Alertness: Awake/alert Behavior During Therapy: Flat affect Overall Cognitive Status: Difficult to assess                     Current Attention Level: Sustained   Following Commands: Follows one step commands consistently;Follows one step commands with increased time     Problem Solving: Difficulty sequencing;Slow processing;Requires verbal cues;Requires tactile cues General Comments: L side inattention      Exercises      General Comments        Pertinent Vitals/Pain Pain Assessment: Faces Faces Pain Scale: Hurts little more Pain Location: headache Pain Descriptors / Indicators: Headache Pain Intervention(s): Monitored during session    Home Living                      Prior Function            PT Goals (current goals can now be found in the care plan section) Acute Rehab PT Goals Patient Stated Goal: none stated PT Goal Formulation: Patient unable to participate in goal setting Time For Goal Achievement: 05/04/17 Potential to Achieve Goals: Fair Progress towards PT goals: Progressing toward goals    Frequency    Min 3X/week      PT Plan Current plan remains appropriate    Co-evaluation  AM-PAC PT "6 Clicks" Daily Activity  Outcome Measure  Difficulty turning over in bed (including adjusting bedclothes, sheets and blankets)?: Unable Difficulty moving from lying on back to sitting on the side of the bed? : Unable Difficulty sitting down on and standing up from a chair with arms (e.g., wheelchair, bedside commode, etc,.)?: Unable Help needed moving to and from a bed to chair (including a wheelchair)?: Total Help needed walking in hospital room?: Total Help needed climbing 3-5 steps with a railing? 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

## 2017-04-26 NOTE — Progress Notes (Signed)
  Speech Language Pathology Treatment: Nada Boozer Speaking valve  Patient Details Name: Joe Wilkinson MRN: [id number] DOB: May 06, 1961 Today's Date: 04/26/2017 Time: 7510-2585 SLP Time Calculation (min) (ACUTE ONLY): 25 min  Assessment / Plan / Recommendation Clinical Impression  Skilled treatment session focused on toleration of PMSV and swallow initiation. Pt was drowsy but able to open eyes, wave hello. PMSV placed and no signs of air stacking or decline in vitals. Pt with no attempt to phonate despite Total A cues. Prefunctional cues given to help facilitate swallow (oral care). Pt with no obvious swallow initiation, likely d/t limited alertness at that part of session. SLP removed PMSV and left pt asleep with all needs within reach.    HPI HPI: 56 yo male brought to [facility] with altered mental status from DKA and sepsis. 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 Required Rt decompressive craniectomy and remained on vent post op.ETT 8/23-9/5, s/p tracheostomy 9/5.       SLP Plan  Continue with current plan of care       Recommendations  Diet recommendations: NPO      Patient may use Passy-Muir Speech Valve: Intermittently with supervision PMSV Supervision: Full MD: Please consider changing trach tube to : Smaller size         Oral Care Recommendations: Oral care QID Follow up Recommendations: Skilled Nursing facility SLP Visit Diagnosis: Cognitive communication deficit (I77.824) Plan: Continue with current plan of care       GO                [name] 04/26/2017, 3:20 PM

## 2017-04-27 DIAGNOSIS — R0902 Hypoxemia: Secondary | ICD-10-CM

## 2017-04-27 DIAGNOSIS — R519 Headache, unspecified: Secondary | ICD-10-CM

## 2017-04-27 DIAGNOSIS — R51 Headache: Secondary | ICD-10-CM

## 2017-04-27 DIAGNOSIS — Z930 Tracheostomy status: Secondary | ICD-10-CM

## 2017-04-27 DIAGNOSIS — D62 Acute posthemorrhagic anemia: Secondary | ICD-10-CM

## 2017-04-27 DIAGNOSIS — I480 Paroxysmal atrial fibrillation: Secondary | ICD-10-CM

## 2017-04-27 LAB — GLUCOSE, CAPILLARY
GLUCOSE-CAPILLARY: 105 mg/dL — AB (ref 65–99)
GLUCOSE-CAPILLARY: 127 mg/dL — AB (ref 65–99)
GLUCOSE-CAPILLARY: 135 mg/dL — AB (ref 65–99)
Glucose-Capillary: 193 mg/dL — ABNORMAL HIGH (ref 65–99)
Glucose-Capillary: 129 mg/dL — ABNORMAL HIGH (ref 65–99)

## 2017-04-27 LAB — CULTURE, BLOOD (ROUTINE X 2)
CULTURE: NO GROWTH
Culture: NO GROWTH
SPECIAL REQUESTS: ADEQUATE
Special Requests: ADEQUATE

## 2017-04-27 MED ORDER — IBUPROFEN 200 MG PO TABS
400.0000 mg | ORAL_TABLET | Freq: Four times a day (QID) | ORAL | Status: DC | PRN
  Administered 2017-04-27 – 2017-05-29 (×27): 400 mg via ORAL
  Filled 2017-04-27 (×27): qty 2

## 2017-04-27 MED ORDER — HEPARIN SODIUM (PORCINE) 5000 UNIT/ML IJ SOLN
5000.0000 [IU] | Freq: Three times a day (TID) | INTRAMUSCULAR | Status: DC
  Administered 2017-04-27 – 2017-05-02 (×16): 5000 [IU] via SUBCUTANEOUS
  Filled 2017-04-27 (×15): qty 1

## 2017-04-27 NOTE — Progress Notes (Signed)
Physical Therapy Treatment Patient Details Name: Joe Wilkinson MRN: [id number] DOB: 1960/10/18 Today's Date: 04/27/2017    History of Present Illness 56 yo male brought to [facility] with altered mental status from DKA and sepsis.  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  Required Rt decompressive craniectomy and remained on vent post op.ETT 8/23-9/5, s/p tracheostomy 9/5    PT Comments    Patient seen in conjunction with OT therapist for EOb functional task performance and sitting balance activities. Patient following simple commands consistently but remains easily distracted from task by environment and stimulation. Some improvements in trunk activation but continues to required +2 physical assist for all aspects of mobility. Will continue to see and progress as tolerated. At this time SNF remains appropriate.   Follow Up Recommendations  SNF;Supervision/Assistance - 24 hour     Equipment Recommendations  Other (comment) (TBD)    Recommendations for Other Services       Precautions / Restrictions Precautions Precautions: Fall;Other (comment) Precaution Comments: craniectomy, no bone flap right side; trach in place Restrictions Weight Bearing Restrictions: No    Mobility  Bed Mobility Overal bed mobility: Needs Assistance   Rolling: Max assist;Total assist   Supine to sit: Max assist;+2 for physical assistance;HOB elevated Sit to supine: +2 for physical assistance;Total assist   General bed mobility comments: rolled towards right side, patient able to initiate right RUE and LE movement to EOB, manual hand placement on rail right side to pull to sit, +2 max to elevate trunk and rotate hips to EOB  Transfers                 General transfer comment: unable   Ambulation/Gait         Gait velocity: unable       Stairs            Wheelchair Mobility    Modified Rankin (Stroke Patients Only) Modified Rankin (Stroke Patients Only) Pre-Morbid Rankin  Score: No symptoms Modified Rankin: Severe disability     Balance Overall balance assessment: Needs assistance Sitting-balance support: Feet supported;Single extremity supported Sitting balance-Leahy Scale: Zero Sitting balance - Comments: Moderate to max assist at EOB ~15 minutes. Patient able to initate some trunk activitation for positioning, demonstrates use of RUE to help maintain stability at times in conjunction with therapist assist Postural control: Posterior lean                                  Cognition Arousal/Alertness: Awake/alert Behavior During Therapy: Flat affect Overall Cognitive Status: Difficult to assess Area of Impairment: Problem solving;Awareness;Safety/judgement;Following commands;Attention                   Current Attention Level: Sustained   Following Commands: Follows one step commands consistently;Follows one step commands with increased time   Awareness: Intellectual Problem Solving: Difficulty sequencing;Slow processing;Requires verbal cues;Requires tactile cues General Comments: L side inattention      Exercises General Exercises - Lower Extremity Ankle Circles/Pumps:  (PROM performed in conjunction with mobility)    General Comments General comments (skin integrity, edema, etc.): patient following commands (one step) during functional task performance at EOB. Easily distracted by environment.      Pertinent Vitals/Pain Pain Assessment: Faces Pain Score: 5  Faces Pain Scale: Hurts little more Pain Location: headache Pain Descriptors / Indicators: Headache    Home Living  Prior Function            PT Goals (current goals can now be found in the care plan section) Acute Rehab PT Goals Patient Stated Goal: none stated PT Goal Formulation: Patient unable to participate in goal setting Time For Goal Achievement: 05/04/17 Potential to Achieve Goals: Fair Progress towards PT goals:  Progressing toward goals    Frequency    Min 3X/week      PT Plan Current plan remains appropriate    Co-evaluation PT/OT/SLP Co-Evaluation/Treatment: Yes Reason for Co-Treatment: Complexity of the patient's impairments (multi-system involvement) PT goals addressed during session: Mobility/safety with mobility OT goals addressed during session: ADL's and self-care      AM-PAC PT "6 Clicks" Daily Activity  Outcome Measure  Difficulty turning over in bed (including adjusting bedclothes, sheets and blankets)?: Unable Difficulty moving from lying on back to sitting on the side of the bed? : Unable Difficulty sitting down on and standing up from a chair with arms (e.g., wheelchair, bedside commode, etc,.)?: Unable Help needed moving to and from a bed to chair (including a wheelchair)?: Total Help needed walking in hospital room?: Total Help needed climbing 3-5 steps with a railing? : Total 6 Click Score: 6    End of Session Equipment Utilized During Treatment: Oxygen (28% Fi)2) Activity Tolerance: Patient tolerated treatment well Patient left: in bed;with call bell/phone within reach;with bed alarm set Nurse Communication: Mobility status;Need for lift equipment PT Visit Diagnosis: Other symptoms and signs involving the nervous system (R29.898);Hemiplegia and hemiparesis Hemiplegia - Right/Left: Left Hemiplegia - dominant/non-dominant: Non-dominant Hemiplegia - caused by: Nontraumatic intracerebral hemorrhage     Time: 7342-8768 PT Time Calculation (min) (ACUTE ONLY): 25 min  Charges:  $Therapeutic Activity: 8-22 mins                    G Codes:       Alben Deeds, PT DPT  Board Certified Neurologic Specialist [phone]    Duncan Dull 04/27/2017, 9:11 AM

## 2017-04-27 NOTE — Progress Notes (Signed)
  Speech Language Pathology Treatment: Nada Boozer Speaking valve  Patient Details Name: Joe Wilkinson MRN: [id number] DOB: 04-27-1961 Today's Date: 04/27/2017 Time: 0945-1000 SLP Time Calculation (min) (ACUTE ONLY): 15 min  Assessment / Plan / Recommendation Clinical Impression  Pt more alert, participatory today.  PMV placed; excellent toleration with no signs of air trapping; VS remained stable.  Pt with increased spontaneity of verbalizations.  Phonation remains hypophonic; increases volume with mod cues.  Oriented to person, elements of time and place.  States he hasn't eaten for five weeks and is hungry.  Conversing at phrase level. Max cues to attend to left.  Noted with spontaneous swallow throughout session.  Pt is ready for objective swallow study - will schedule for next date.  D/W RN.   HPI HPI: 56 yo male brought to [facility] with altered mental status from DKA and sepsis. 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 Required Rt decompressive craniectomy and remained on vent post op.ETT 8/23-9/5, s/p tracheostomy 9/5.       SLP Plan  Continue with current plan of care;MBS       Recommendations  Diet recommendations: NPO                Plan: Continue with current plan of care;MBS       GO                Juan Quam Laurice 04/27/2017, 1:33 PM

## 2017-04-27 NOTE — Progress Notes (Signed)
PROGRESS NOTE  Joe Wilkinson  OZD:[id number] DOB: [birth year] DOA: 03/31/2017 PCP: Patient, No Pcp Per  Brief Narrative:   68 DM who [place],  taken to [facility] found to be in DKA with sepsis from uncertain source Bedias when found> intubation + hemiparesis-massive right MCA and PCA territory infarcts with mass effect>>transfer to  ICU care.  S/p decompressive right craniectomy 8/25.  Hypertonic saline was started per neurology recommendations and has been stopped He required transfusion support and was treated for AFib with RVR, continued on the ventilator, eventually receiving trach 9/5 and PEG 9/6, on ATC 9/7.   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 agalactiae PNA, then restarted on zosyn 9/1 - 9/8 due to concern for aspiration.  - Continue trach - Plan to downsize to 4 cuffless trach in AM - Repeat chest x-ray 9/17 because of dislodged tracheotomy, showed no lung disease actively  Septic shock due to S. agalactiae pneumonia: -Resolved, completed abx 8/30.    Peri-anal cellulitis with small abcess, almost completely resolved Gen surgery evaluated and determined no surgical drainage necessary -  Continue clindamycin through 9/22, then stop  Massive right MCA territory infarct with PCA watershed infarct, subsequent cytotoxic edema requiring decompressive craniectomy, hypertonic saline, now with tracheostomy and PEG - ASA per neurology - Now trach/PEG-dependent. Tube feeding at goal - Follow up with neurosurgery in 3 months to assess for cranioplasty - Tylenol and occasional ibuprofen for headache  AFib with RVR,  CHad2Vasc2 score=4, likely caused by acute illness - has maintained NSR, no longer on telemetry - Continue ASA, not on full dose a/c due to recent anemia requiring 4 units of PRBC - Continue metprolol  Acute anemia, hemoglobin gradually improving - CT abdomen negative, no gross GI bleeding. EGD at time of PEG placement showed normal esophagus, stomach, duodenal bulb. Required transfusions x4.  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   DVT prophylaxis: start heparin subcutaneously and monitor for bleeding Code Status: Full Family Communication: None at bedside  Disposition Plan: awaiting placement at SNF  Consultants:   [place]  Neurology  Neurosurgery  gen surg  SIGNIFICANT EVENTS: 8/23 Admit [facility] 8/23 8/24 Transfer to [facility] after Rt MCA CVA, start 3% NS 8/25 Decompressive craniectomy 8/27 Transfuse 1 unit PRBC 8/28 Transfuse 1 unit PRBC, A fib with RVR 8/30 Transfuse 1 unit PRBC 8/31 Off pressors, reintubated x 2, transfuse PRBC 9/5 Trach 9/6 PEG 9/15 abcess in rectal area, surgery consulted  Cultures/antimicrobials: CSF 8/24: negative BCX 8/24: negative UCX 8/24: negative Sputum 8/24: S. Agalactiae Sputum 9/1: Normal resp. flora  Vancomycin 8/24 - 8/26 Zosyn 8/24 - 8/26, 9/1 - 9/8 Ampicillin 8/27 - 8/30 vanc 9/15 9/18 Zosyn 9/16 9/18 Clindamycin 9/18   Antimicrobials:  Anti-infectives    Start     Dose/Rate Route Frequency Ordered Stop   04/26/17 0600  vancomycin (VANCOCIN) IVPB 750 mg/150 ml premix  Status:  Discontinued     750  mg 150 mL/hr over 60 Minutes Intravenous Every 12 hours 04/25/17 1729 04/25/17 1730   04/25/17 1830  clindamycin (CLEOCIN) capsule 300 mg     300 mg Oral Every 6 hours 04/25/17 1730 04/29/17 2359   04/25/17 1800  vancomycin (VANCOCIN) IVPB 750 mg/150 ml  premix  Status:  Discontinued     750 mg 150 mL/hr over 60 Minutes Intravenous Every 12 hours 04/25/17 1728 04/25/17 1729   04/23/17 0945  piperacillin-tazobactam (ZOSYN) IVPB 3.375 g  Status:  Discontinued     3.375 g 12.5 mL/hr over 240 Minutes Intravenous Every 8 hours 04/23/17 0930 04/25/17 1730   04/23/17 0430  vancomycin (VANCOCIN) IVPB 1000 mg/200 mL premix  Status:  Discontinued     1,000 mg 200 mL/hr over 60 Minutes Intravenous Every 12 hours 04/22/17 1655 04/25/17 1728   04/22/17 1630  vancomycin (VANCOCIN) IVPB 1000 mg/200 mL premix     1,000 mg 200 mL/hr over 60 Minutes Intravenous  Once 04/22/17 1627 04/22/17 1826   04/08/17 0200  piperacillin-tazobactam (ZOSYN) IVPB 3.375 g  Status:  Discontinued     3.375 g 12.5 mL/hr over 240 Minutes Intravenous Every 8 hours 04/08/17 0058 04/15/17 1601   04/08/17 0100  piperacillin-tazobactam (ZOSYN) IVPB 3.375 g  Status:  Discontinued     3.375 g 100 mL/hr over 30 Minutes Intravenous Every 6 hours 04/08/17 0054 04/08/17 0057   04/03/17 1200  ampicillin (OMNIPEN) 2 g in sodium chloride 0.9 % 50 mL IVPB     2 g 150 mL/hr over 20 Minutes Intravenous Every 6 hours 04/03/17 1005 04/06/17 1837   04/01/17 1800  vancomycin (VANCOCIN) IVPB 1000 mg/200 mL premix  Status:  Discontinued     1,000 mg 200 mL/hr over 60 Minutes Intravenous Every 24 hours 04/01/17 0146 04/01/17 0154   04/01/17 1600  ceFAZolin (ANCEF) IVPB 2g/100 mL premix     2 g 200 mL/hr over 30 Minutes Intravenous Every 8 hours 04/01/17 1100 04/02/17 0056   04/01/17 0828  bacitracin 50,000 Units in sodium chloride irrigation 0.9 % 500 mL irrigation  Status:  Discontinued       As needed 04/01/17 0837 04/01/17 0947   04/01/17 0600  piperacillin-tazobactam (ZOSYN) IVPB 3.375 g  Status:  Discontinued     3.375 g 12.5 mL/hr over 240 Minutes Intravenous Every 8 hours 04/01/17 0146 04/01/17 0154   04/01/17 0600  vancomycin (VANCOCIN) IVPB 750 mg/150 ml premix  Status:  Discontinued       750 mg 150 mL/hr over 60 Minutes Intravenous Every 12 hours 04/01/17 0225 04/03/17 0959   04/01/17 0400  piperacillin-tazobactam (ZOSYN) IVPB 3.375 g  Status:  Discontinued     3.375 g 12.5 mL/hr over 240 Minutes Intravenous Every 8 hours 04/01/17 0225 04/03/17 0959       Subjective:  Headache 5/10, better than yesterday, but still not comfortable.  Worse with cough.  Also having some right leg spasms along the lateral thigh area but no swelling.    Objective: Vitals:   04/27/17 0929 04/27/17 1000 04/27/17 1205 04/27/17 1318  BP: (!) 101/51 (!) 95/49  98/77  Pulse: 97 96 94 (!) 101  Resp: 20  (!) 22 20  Temp: 98.3 F (36.8 C)   98.8 F (37.1 C)  TempSrc: Oral   Oral  SpO2: 100%  100% 100%  Weight:      Height:        Intake/Output Summary (Last 24 hours) at 04/27/17 1334 Last data filed at 04/27/17  1000  Gross per 24 hour  Intake              200 ml  Output              950 ml  Net             -750 ml   Filed Weights   04/25/17 0412 04/26/17 0416 04/26/17 2055  Weight: 88.4 kg (194 lb 14.2 oz) 85.5 kg (188 lb 7.9 oz) 88.7 kg (195 lb 8.8 oz)    Examination:  General exam:  Adult male, clearly preferring the right side.  No acute distress.  HEENT:  Swollen right temporal, parietal, and occipital areas with some TTP.  Trach collar in place.   Respiratory system: Clear to auscultation bilaterally Cardiovascular system: Regular rate and rhythm, normal S1/S2. No murmurs, rubs, gallops or clicks.  Warm extremities Gastrointestinal system: Normal active bowel sounds, soft, nondistended, nontender.  PEG tube with tube feeds  MSK:  Normal tone and bulk, no lower extremity edema Neuro:  Left hemineglect, left hemiparesis Perianal area not observed today  Data Reviewed: I have personally reviewed following labs and imaging studies  CBC:  Recent Labs Lab 04/21/17 0349 04/23/17 0904 04/24/17 0424 04/25/17 0337 04/26/17 0340  WBC 16.1* 14.0* 15.9* 16.0* 16.0*   NEUTROABS  --   --  9.9* 10.3* 10.7*  HGB 9.1* 10.5* 9.5* 9.5* 10.7*  HCT 30.9* 34.6* 31.8* 31.9* 35.3*  MCV 96.6 94.5 94.4 94.4 95.1  PLT 633* 515* 607* 594* 761*   Basic Metabolic Panel:  Recent Labs Lab 04/21/17 0349 04/24/17 0424 04/25/17 0337 04/26/17 0340  NA 135 134* 134* 137  K 4.3 4.4 4.3 4.4  CL 100* 101 99* 102  CO2 28 27 26 29   GLUCOSE 151* 164* 174* 167*  BUN 27* 27* 27* 29*  CREATININE 0.85 0.92 1.02 0.99  CALCIUM 10.1 10.1 10.3 10.9*   GFR: Estimated Creatinine Clearance: 93.5 mL/min (by C-G formula based on SCr of 0.99 mg/dL). Liver Function Tests:  Recent Labs Lab 04/21/17 0349 04/24/17 0424 04/25/17 0337 04/26/17 0340  AST 20 23 20 20   ALT 25 23 24 24   ALKPHOS 68 67 66 73  BILITOT 0.2* 0.4 0.3 0.2*  PROT 8.3* 8.0 8.1 8.3*  ALBUMIN 2.2* 2.3* 2.3* 2.5*   No results for input(s): LIPASE, AMYLASE in the last 168 hours. No results for input(s): AMMONIA in the last 168 hours. Coagulation Profile:  Recent Labs Lab 04/21/17 0349  INR 1.13   Cardiac Enzymes: No results for input(s): CKTOTAL, CKMB, CKMBINDEX, TROPONINI in the last 168 hours. BNP (last 3 results) No results for input(s): PROBNP in the last 8760 hours. HbA1C: No results for input(s): HGBA1C in the last 72 hours. CBG:  Recent Labs Lab 04/26/17 1643 04/26/17 1908 04/26/17 2322 04/27/17 0331 04/27/17 1122  GLUCAP 111* 124* 127* 105* 193*   Lipid Profile: No results for input(s): CHOL, HDL, LDLCALC, TRIG, CHOLHDL, LDLDIRECT in the last 72 hours. Thyroid Function Tests: No results for input(s): TSH, T4TOTAL, FREET4, T3FREE, THYROIDAB in the last 72 hours. Anemia Panel: No results for input(s): VITAMINB12, FOLATE, FERRITIN, TIBC, IRON, RETICCTPCT in the last 72 hours. Urine analysis:    Component Value Date/Time   COLORURINE YELLOW (A) 03/31/2017 0945   APPEARANCEUR CLOUDY (A) 03/31/2017 0945   LABSPEC 1.018 03/31/2017 0945   PHURINE 5.0 03/31/2017 0945   GLUCOSEU 50  (A) 03/31/2017 0945   HGBUR MODERATE (A) 03/31/2017 0945   BILIRUBINUR NEGATIVE 03/31/2017  [place] 03/31/2017 0945   PROTEINUR 30 (A) 03/31/2017 0945   NITRITE NEGATIVE 03/31/2017 0945   LEUKOCYTESUR NEGATIVE 03/31/2017 0945   Sepsis Labs: @LABRCNTIP (procalcitonin:4,lacticidven:4)  ) Recent Results (from the past 240 hour(s))  Culture, blood (routine x 2)     Status: None (Preliminary result)   Collection Time: 04/22/17  4:50 PM  Result Value Ref Range Status   Specimen Description BLOOD LEFT HAND  Final   Special Requests IN PEDIATRIC BOTTLE Blood Culture adequate volume  Final   Culture NO GROWTH 4 DAYS  Final   Report Status PENDING  Incomplete  Culture, blood (routine x 2)     Status: None (Preliminary result)   Collection Time: 04/22/17  4:57 PM  Result Value Ref Range Status   Specimen Description BLOOD LEFT HAND  Final   Special Requests IN PEDIATRIC BOTTLE Blood Culture adequate volume  Final   Culture NO GROWTH 4 DAYS  Final   Report Status PENDING  Incomplete      Radiology Studies: No results found.   Scheduled Meds: . aspirin  325 mg Per Tube Daily  . chlorhexidine  15 mL Mouth Rinse BID  . clindamycin  300 mg Oral Q6H  . feeding supplement (PRO-STAT SUGAR FREE 64)  30 mL Per Tube BID  . glycopyrrolate  1 mg Oral BID  . insulin aspart  0-20 Units Subcutaneous Q4H  . insulin detemir  34 Units Subcutaneous Daily  . mouth rinse  15 mL Mouth Rinse q12n4p  . metoprolol succinate  12.5 mg Oral Daily  . pantoprazole sodium  40 mg Per Tube Daily   Continuous Infusions: . feeding supplement (GLUCERNA 1.2 CAL) 1,000 mL (04/27/17 0210)     LOS: 26 days    Time spent: 30 min    Janece Canterbury, MD [facility] Pager [phone]  If 7PM-7AM, please contact night-coverage [email or web address] Password [email or web address] 04/27/2017, 1:34 PM

## 2017-04-27 NOTE — Progress Notes (Signed)
PULMONARY / CRITICAL CARE MEDICINE   Name: Joe Wilkinson MRN: [id number] DOB: 1961-04-24    ADMISSION DATE:  03/31/2017 CHIEF COMPLAINT:  CVA  HISTORY OF PRESENT ILLNESS:   56 yo male brought to [facility] with altered mental status from DKA and sepsis.  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  Required Rt decompressive craniectomy and remained on vent post op.  SUBJECTIVE:  No issues.  Tol ATC 24/7. Follows commands.  Still mod secretions.   VITAL SIGNS: BP (!) 101/51 (BP Location: Right Arm)   Pulse 97   Temp 98.3 F (36.8 C) (Oral)   Resp 20   Ht 5\' 10"  (1.778 m)   Wt 88.7 kg (195 lb 8.8 oz)   SpO2 100%   BMI 28.06 kg/m   VENTILATOR SETTINGS: FiO2 (%):  [28 %] 28 %  INTAKE / OUTPUT: I/O last 3 completed shifts: In: 99 [Other:140; NG/GT:715] Out: 2025 [Urine:2025]  PHYSICAL EXAMINATION:  Gen:      Chronically ill appearing, NAD  HEENT:  Head bulges r side where flap missing, atraumatic, PERRL, no JVD Neck:      #6 cuffless trach c/d  Lungs:   Resps even, unlabored. Clear bilaterally CV:         RRR, Nl S1/S2, -M/R/G. Abd:     Soft, non-tender, non-distended, BS+ Ext:   Minimal edema, otherwise unremarkable Skin:       Warm,dry,  intact, without rash or lesions Neuro:   Awake and alert, moving right side, Following basic commands, nods appropriately   BMET  Recent Labs Lab 04/24/17 0424 04/25/17 0337 04/26/17 0340  NA 134* 134* 137  K 4.4 4.3 4.4  CL 101 99* 102  CO2 27 26 29   BUN 27* 27* 29*  CREATININE 0.92 1.02 0.99  GLUCOSE 164* 174* 167*    Electrolytes  Recent Labs Lab 04/24/17 0424 04/25/17 0337 04/26/17 0340  CALCIUM 10.1 10.3 10.9*    CBC  Recent Labs Lab 04/24/17 0424 04/25/17 0337 04/26/17 0340  WBC 15.9* 16.0* 16.0*  HGB 9.5* 9.5* 10.7*  HCT 31.8* 31.9* 35.3*  PLT 607* 594* 560*    Coag's  Recent Labs Lab 04/21/17 0349  INR 1.13    Sepsis Markers No results for input(s): LATICACIDVEN, PROCALCITON, O2SATVEN in the  last 168 hours.  ABG No results for input(s): PHART, PCO2ART, PO2ART in the last 168 hours.  Liver Enzymes  Recent Labs Lab 04/24/17 0424 04/25/17 0337 04/26/17 0340  AST 23 20 20   ALT 23 24 24   ALKPHOS 67 66 73  BILITOT 0.4 0.3 0.2*  ALBUMIN 2.3* 2.3* 2.5*    Cardiac Enzymes No results for input(s): TROPONINI, PROBNP in the last 168 hours.  Glucose  Recent Labs Lab 04/26/17 0801 04/26/17 1230 04/26/17 1643 04/26/17 1908 04/26/17 2322 04/27/17 0331  GLUCAP 130* 142* 111* 124* 127* 105*    Imaging No results found.  STUDIES:  LP 8/24 >> glucose 205, RBC 16, protein 62, WBC 5 CT head 8/24 >> Large Rt MCA EEG 8/27 >> bilateral cerebral dysfunction, non-specific in etiology Echo 8/27 >> EF 65 to 70% CT abd/pelvis 8/30 >> third spacing, 3.5 cm low attenuation Lt hepatic lobe CT Head 9/3>> Severe right hemisphere cytotoxic edema with stable extension of edematous brain through the right craniectomy defect. Stable petechial hemorrhage without malignant hemorrhagic Transformation. Stable intracranial mass effect with leftward midline shift of 7 mm. Small left PCA territory infarct.   CULTURES: CSF 8/24>> negative BCX 8/24>> negative  UCX 8/24>> negative Sputum 8/24>> S. Agalactiae Sputum 9/1>> Consistent with normal resp. flora  ANTIBIOTICS: vanco 8/24 >> 8/26 Zosyn 8/24 >> 8/26, 9/1 >>off  Cleocin 9/18>>> Ampicillin 8/27 > 8/30  SIGNIFICANT EVENTS: 8/23 Admit [facility] 8/23 8/24 Transfer to [facility] after Rt MCA CVA, start 3% NS 8/25 Decompressive craniectomy 8/27 Transfuse 1 unit PRBC 8/28 Transfuse 1 unit PRBC, A fib with RVR 8/30 Transfuse 1 unit PRBC 8/31 Off pressors, reintubated x 2, transfuse PRBC 9/5 Trach 9/6 PEG 9/14 Trach exchanged from 8 cuffed to 6 cuffless  LINES/TUBES: ETT 8/23 >>9/5 Trach [name]) 9/5>>> Lt IJ CVL 8/23 >> PEG 9/6 >  ASSESSMENT / PLAN:  Acute on chronic hypoxemic respiratory failure s/p trach  Small pleural effusion-  improved.  Plan -  - Continue ATC as tolerated - pulmonary hygiene  - PMV per speech  - Intermittent CXR - can consider downsize to #4 cuffless but would not consider decannulation at this pont   Dysphagia - continue TF - NPO per speech - SLP following - PMV  Rest per primary.  Joe Madrid, NP 04/27/2017  9:49 AM Pager: [phone] or [phone]  Attending Note:  56 year old male with acute on chronic hypoxemic respiratory failure s/p trach and pleural effusion presenting to PCCM for trach management.  On exam, coarse BS diffusely.  I reviewed CXR myself, trach in good position.  Discussed with PCCM-NP.  Respiratory failure:  - Maintain on TC as tolerated  Hypoxemia  - Titrate O2 for sat of 88-92%  Trach status: 6 cuffless  - Downsize to 4 cuffless in AM  - No decannulation  PCCM will continue to follow  Patient seen and examined, agree with above note.  I dictated the care and orders written for this patient under my direction.  Rush Farmer, [place]

## 2017-04-27 NOTE — Progress Notes (Signed)
Occupational Therapy Treatment Patient Details Name: Joe Wilkinson MRN: [id number] DOB: 1961/06/24 Today's Date: 04/27/2017    History of present illness 56 yo male brought to [facility] with altered mental status from DKA and sepsis.  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  Required Rt decompressive craniectomy and remained on vent post op.ETT 8/23-9/5, s/p tracheostomy 9/5   OT comments  Pt progressing towards established OT goals. Pt continues to present with decreased sitting balance, L side inattention, cognition, and L sided weakness. Performed grooming task at EOB with Mod-Max +2 and Max VCs to perform and sequence task. Continues to recommend dc to SNF for further OT to optimize independence and safety with ADLs. Will continues to follow to facilitate safe dc.    Follow Up Recommendations  SNF;Supervision/Assistance - 24 hour    Equipment Recommendations  Other (comment) (Defer to next venue)    Recommendations for Other Services      Precautions / Restrictions Precautions Precautions: Fall;Other (comment) Precaution Comments: craniectomy, no bone flap right side; trach in place Restrictions Weight Bearing Restrictions: No       Mobility Bed Mobility Overal bed mobility: Needs Assistance   Rolling: Max assist;Total assist   Supine to sit: Max assist;+2 for physical assistance;HOB elevated Sit to supine: +2 for physical assistance;Total assist   General bed mobility comments: rolled towards right side, patient able to initiate right RUE and LE movement to EOB, manual hand placement on rail right side to pull to sit, +2 max to elevate trunk and rotate hips to EOB  Transfers                 General transfer comment: unable     Balance Overall balance assessment: Needs assistance Sitting-balance support: Feet supported;Single extremity supported Sitting balance-Leahy Scale: Zero Sitting balance - Comments: Moderate to max assist at EOB ~15 minutes. Patient  able to initate some trunk activitation for positioning, demonstrates use of RUE to help maintain stability at times in conjunction with therapist assist Postural control: Posterior lean                                 ADL either performed or assessed with clinical judgement   ADL Overall ADL's : Needs assistance/impaired Eating/Feeding: NPO   Grooming: Moderate assistance;Maximal assistance;Wash/dry face;Sitting Grooming Details (indicate cue type and reason): Sitting at EOB with Mod-Max A to maintain sitting balance. Pt very slow for righting reactions into pright sitting posture. Used RUE throughout task. Follow simple/direct commands. Unable to follow more abstract commands. Using weightbearing through L hand. VCs to bring awareness to L side during groomigng task.                                General ADL Comments: Pt fatigues quickly during grooming task at EOB.     Vision       Perception     Praxis      Cognition Arousal/Alertness: Awake/alert Behavior During Therapy: Flat affect Overall Cognitive Status: Difficult to assess Area of Impairment: Problem solving;Awareness;Safety/judgement;Following commands;Attention                   Current Attention Level: Sustained   Following Commands: Follows one step commands consistently;Follows one step commands with increased time Safety/Judgement: Decreased awareness of safety;Decreased awareness of deficits Awareness: Intellectual Problem Solving: Difficulty sequencing;Slow processing;Requires verbal  cues;Requires tactile cues General Comments: L side inattention. Following simple, direct one step commands consistantly; unable to follow more abstrat one step commands.         Exercises General Exercises - Lower Extremity Ankle Circles/Pumps:  (PROM performed in conjunction with mobility)   Shoulder Instructions       General Comments BP 109/49 supine in bed    Pertinent Vitals/  Pain       Pain Assessment: Faces Pain Score: 5  Faces Pain Scale: Hurts little more Pain Location: headache Pain Descriptors / Indicators: Headache Pain Intervention(s): Monitored during session;Repositioned  Home Living                                          Prior Functioning/Environment              Frequency  Min 2X/week        Progress Toward Goals  OT Goals(current goals can now be found in the care plan section)  Progress towards OT goals: Progressing toward goals  Acute Rehab OT Goals Patient Stated Goal: none stated ADL Goals Pt Will Perform Grooming: with min assist;sitting Additional ADL Goal #1: Pt will perform bed mobility as precursor to ADL at min A level Additional ADL Goal #2: Pt will track items with multimodal cues past mdline 50% of the time Additional ADL Goal #3: Pt will tolerate sitting EOB with min A as precursor to performing ADL  Plan Discharge plan remains appropriate    Co-evaluation    PT/OT/SLP Co-Evaluation/Treatment: Yes Reason for Co-Treatment: Complexity of the patient's impairments (multi-system involvement) PT goals addressed during session: Mobility/safety with mobility OT goals addressed during session: ADL's and self-care      AM-PAC PT "6 Clicks" Daily Activity     Outcome Measure   Help from another person eating meals?: Total Help from another person taking care of personal grooming?: Total Help from another person toileting, which includes using toliet, bedpan, or urinal?: Total Help from another person bathing (including washing, rinsing, drying)?: Total Help from another person to put on and taking off regular upper body clothing?: Total Help from another person to put on and taking off regular lower body clothing?: Total 6 Click Score: 6    End of Session Equipment Utilized During Treatment: Oxygen  OT Visit Diagnosis: Unsteadiness on feet (R26.81);Muscle weakness (generalized) (M62.81);Low  vision, both eyes (H54.2);Other symptoms and signs involving the nervous system (R29.898);Other symptoms and signs involving cognitive function;Hemiplegia and hemiparesis;Pain Hemiplegia - Right/Left: Left Hemiplegia - dominant/non-dominant: Non-Dominant Hemiplegia - caused by: Cerebral infarction Pain - Right/Left: Right   Activity Tolerance Patient tolerated treatment well   Patient Left in bed;with call bell/phone within reach;with bed alarm set;with nursing/sitter in room;with SCD's reapplied   Nurse Communication Mobility status;Patient requests pain meds        Time: 3532-9924 OT Time Calculation (min): 25 min  Charges: OT General Charges $OT Visit: 1 Visit OT Treatments $Self Care/Home Management : 8-22 mins  [place], OTR/L Acute Rehab Pager: [phone] Office: [place] 04/27/2017, 9:58 AM

## 2017-04-28 ENCOUNTER — Inpatient Hospital Stay (HOSPITAL_COMMUNITY): Payer: Medicaid Other

## 2017-04-28 DIAGNOSIS — R51 Headache: Secondary | ICD-10-CM

## 2017-04-28 LAB — GLUCOSE, CAPILLARY
GLUCOSE-CAPILLARY: 127 mg/dL — AB (ref 65–99)
Glucose-Capillary: 150 mg/dL — ABNORMAL HIGH (ref 65–99)
Glucose-Capillary: 156 mg/dL — ABNORMAL HIGH (ref 65–99)
Glucose-Capillary: 129 mg/dL — ABNORMAL HIGH (ref 65–99)
Glucose-Capillary: 140 mg/dL — ABNORMAL HIGH (ref 65–99)

## 2017-04-28 MED ORDER — ACETAMINOPHEN 160 MG/5ML PO SOLN
650.0000 mg | Freq: Three times a day (TID) | ORAL | Status: DC
  Administered 2017-04-28 – 2017-05-29 (×92): 650 mg
  Filled 2017-04-28 (×92): qty 20.3

## 2017-04-28 NOTE — Progress Notes (Signed)
Modified Barium Swallow Progress Note  Patient Details  Name: Joe Wilkinson MRN: [id number] Date of Birth: 13-Nov-1960  Today's Date: 04/28/2017  Modified Barium Swallow completed.  Full report located under Chart Review in the Imaging Section.  Brief recommendations include the following:  Clinical Impression  Pt has a severe oral and moderate pharyngeal dysphagia. Significant oral holding and anteropr/posterior rocking is observed, especially with thicker textures. Most of the puree was orally suctioned after not being able to initiate a/p transfer given Max cues, significant extra time, and small amounts of honey thick liquids as a liquid wash. Small amounts had already fallen posteriorly to the pharynx without a swallow triggered. He moved thin and nectar thick liquids more readily into his pharynx, but a delay in swallow and incomplete glottal closure allowed for silent aspiration before/druing the swallow. Pt will not cough to command because it makes his head hurt. Small spoonfuls of thin liquid, even after pooling in his pyriform sinuses, did not enter the airway. Recommend allowing a few ice chips, given one at a time by staff, following oral care to provide pt comfort and increase use of swallowing musculature. Would not leave patient with a cup of ice as he would be impulsive with his intake and has trouble consistently initiating a swallow. SLP will continue to follow.   Swallow Evaluation Recommendations       SLP Diet Recommendations: NPO;Ice chips PRN after oral care       Medication Administration: Via alternative means               Oral Care Recommendations: Oral care QID;Other (Comment) (before ice chips)   Other Recommendations: Have oral suction available    [name] 04/28/2017,11:05 AM   [name], M.A. CCC-SLP [phone]

## 2017-04-28 NOTE — Progress Notes (Signed)
Secretary ordered trach before 12:30. 15:00 followed up with Network engineer.  Lurline Idol still not at bedside.  16:40 Just received call trach was there.  Unable to change before 5:00.

## 2017-04-28 NOTE — Progress Notes (Signed)
PULMONARY / CRITICAL CARE MEDICINE   Name: Joe Wilkinson MRN: [id number] DOB: 1961/01/02    ADMISSION DATE:  03/31/2017 CHIEF COMPLAINT:  CVA  HISTORY OF PRESENT ILLNESS:   56 yo male brought to [facility] with altered mental status from DKA and sepsis.  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  Required Rt decompressive craniectomy and remained on vent post op.  SUBJECTIVE:  No Acute Issues  Tolerating  ATC 24/7 without desaturations. Follows commands. alert, Moderate secretions, strong cough, protecting airway  VITAL SIGNS: BP 114/77 (BP Location: Left Arm)   Pulse 99   Temp 98.5 F (36.9 C) (Axillary)   Resp 16   Ht 5\' 10"  (1.778 m)   Wt 195 lb 8.8 oz (88.7 kg)   SpO2 96%   BMI 28.06 kg/m   VENTILATOR SETTINGS: FiO2 (%):  [28 %] 28 %  INTAKE / OUTPUT: I/O last 3 completed shifts: In: 370 [Other:370] Out: 2575 [Urine:1475; Drains:1100]  PHYSICAL EXAMINATION:  Gen:      Chronically ill appearing, NAD on ATC supine in bed  HEENT:  Head bulges r side where flap missing, atraumatic, PERRL, no JVD, trach clean ,midline and secure, WD and Intact. Coretrac in place and secure Neck:      #6 cuffless trach c/d/i , mid line, secure Lungs:   Resps even, unlabored. Clear bilaterally, few scattered rhonchi which clear with cough CV:         RRR,  S1/S2, -M/R/G. Abd:     Soft, non-tender, non-distended, BS+ Ext:   Trace  edema, otherwise unremarkable Skin:       Warm,dry,  intact, without rash or lesions Neuro:   Awake and alert, moving right side, Following simple commands, nods appropriately   BMET  Recent Labs Lab 04/24/17 0424 04/25/17 0337 04/26/17 0340  NA 134* 134* 137  K 4.4 4.3 4.4  CL 101 99* 102  CO2 27 26 29   BUN 27* 27* 29*  CREATININE 0.92 1.02 0.99  GLUCOSE 164* 174* 167*    Electrolytes  Recent Labs Lab 04/24/17 0424 04/25/17 0337 04/26/17 0340  CALCIUM 10.1 10.3 10.9*    CBC  Recent Labs Lab 04/24/17 0424 04/25/17 0337 04/26/17 0340  WBC  15.9* 16.0* 16.0*  HGB 9.5* 9.5* 10.7*  HCT 31.8* 31.9* 35.3*  PLT 607* 594* 560*    Coag's No results for input(s): APTT, INR in the last 168 hours.  Sepsis Markers No results for input(s): LATICACIDVEN, PROCALCITON, O2SATVEN in the last 168 hours.  ABG No results for input(s): PHART, PCO2ART, PO2ART in the last 168 hours.  Liver Enzymes  Recent Labs Lab 04/24/17 0424 04/25/17 0337 04/26/17 0340  AST 23 20 20   ALT 23 24 24   ALKPHOS 67 66 73  BILITOT 0.4 0.3 0.2*  ALBUMIN 2.3* 2.3* 2.5*    Cardiac Enzymes No results for input(s): TROPONINI, PROBNP in the last 168 hours.  Glucose  Recent Labs Lab 04/27/17 1122 04/27/17 1609 04/27/17 1935 04/27/17 2338 04/28/17 0429 04/28/17 0752  GLUCAP 193* 129* 135* 127* 150* 156*    Imaging No results found.  STUDIES:  LP 8/24 >> glucose 205, RBC 16, protein 62, WBC 5 CT head 8/24 >> Large Rt MCA EEG 8/27 >> bilateral cerebral dysfunction, non-specific in etiology Echo 8/27 >> EF 65 to 70% CT abd/pelvis 8/30 >> third spacing, 3.5 cm low attenuation Lt hepatic lobe CT Head 9/3>> Severe right hemisphere cytotoxic edema with stable extension of edematous brain through the right  craniectomy defect. Stable petechial hemorrhage without malignant hemorrhagic Transformation. Stable intracranial mass effect with leftward midline shift of 7 mm. Small left PCA territory infarct.   CULTURES: CSF 8/24>> negative BCX 8/24>> negative UCX 8/24>> negative Sputum 8/24>> S. Agalactiae Sputum 9/1>> Consistent with normal resp. flora  ANTIBIOTICS: vanco 8/24 >> 8/26 Zosyn 8/24 >> 8/26, 9/1 >>off  Cleocin 9/18>>> Ampicillin 8/27 > 8/30  SIGNIFICANT EVENTS: 8/23 Admit [facility] 8/23 8/24 Transfer to [facility] after Rt MCA CVA, start 3% NS 8/25 Decompressive craniectomy 8/27 Transfuse 1 unit PRBC 8/28 Transfuse 1 unit PRBC, A fib with RVR 8/30 Transfuse 1 unit PRBC 8/31 Off pressors, reintubated x 2, transfuse PRBC 9/5 Trach 9/6  PEG 9/14 Trach exchanged from 8 cuffed to 6 cuffless  LINES/TUBES: ETT 8/23 >>9/5 Trach [name]) 9/5>>> Lt IJ CVL 8/23 >> PEG 9/6 >  ASSESSMENT / PLAN:  Acute on chronic hypoxemic respiratory failure s/p trach  Small pleural effusion- improved.  Plan -  - Continue ATC as tolerated - Down Size to 4 cuff less trach 9/21/am (Ordered in EPIC) - Titrate oxygen to Maintain saturations 88-92% - aggressive pulmonary hygiene  - Mobilize as able - PMV per speech  - Intermittent CXR to follow effusion ( Ordered for 9/24) - Not ready for decannulation 2/2 secretions  Dysphagia - Barium swallow 9/21 - continue TF - NPO per speech - SLP following - PMV as able   PCCM will follow for [place] Pod, AGACNP-BC [facility] 04/28/2017  9:57 AM Pager: [phone]  Attending Note:  56 year old male with acute on chronic hypoxemia respiratory failure s/p trach and pleural effusion present to PCCM for vent management.  On exam, coarse BS diffusely.  I reviewed CXR myself, trach is in good position.  Discussed with PCCM-NP.  Respiratory failure:  - Maintain on TC as tolerated  Hypoxemia:  - Titrate O2 for sat of 88-92%.  Trach status:  - Downsize to 4 cuffless shiley today.  - No decannulation.  PCCM will see again on Monday.  Patient seen and examined, agree with above note.  I dictated the care and orders written for this patient under my direction.  Rush Farmer, [place]

## 2017-04-28 NOTE — Progress Notes (Signed)
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  S/p decompressive right craniectomy 8/25.  Hypertonic saline was started per neurology recommendations and has been stopped He required transfusion support and was treated for AFib with RVR, continued on the ventilator, eventually receiving trach 9/5 and PEG 9/6, on ATC 9/7.   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 agalactiae PNA, then restarted on zosyn 9/1 - 9/8 due to concern for aspiration.  - Continue trach - Plan to downsize to 4 cuffless trach today - Repeat chest x-ray 9/17 because of dislodged tracheotomy, showed no lung disease actively  Septic shock due to S. agalactiae pneumonia: -Resolved, completed abx 8/30.    Peri-anal cellulitis with small abcess, almost completely resolved Gen surgery evaluated and determined no surgical drainage necessary -  Continue clindamycin through 9/22, then stop  Massive right MCA territory infarct with PCA watershed infarct, subsequent cytotoxic edema requiring decompressive craniectomy, hypertonic saline, now with tracheostomy and PEG - ASA per neurology - Now trach/PEG-dependent. Tube feeding at goal - Follow up with neurosurgery in 3 months to assess  for cranioplasty - schedule Tylenol and occasional ibuprofen for headache  AFib with RVR, CHad2Vasc2 score=4, likely caused by acute illness - has maintained NSR, no longer on telemetry - Continue ASA, not on full dose a/c due to recent anemia requiring 4 units of PRBC - Continue metprolol  Acute anemia, hemoglobin gradually improving - CT abdomen negative, no gross GI bleeding. EGD at time of PEG placement showed normal esophagus, stomach, duodenal bulb. Required transfusions x4.  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   DVT prophylaxis:  heparin  Code Status: Full Family Communication: None at bedside  Disposition Plan: awaiting placement at SNF  Consultants:   PCCM  Neurology  Neurosurgery  gen surg  SIGNIFICANT EVENTS: 8/23 Admit [facility] 8/23 8/24 Transfer to [facility] after Rt MCA CVA, start 3% NS 8/25 Decompressive craniectomy 8/27 Transfuse 1 unit PRBC 8/28 Transfuse 1 unit PRBC, A fib with RVR 8/30 Transfuse 1 unit PRBC 8/31 Off pressors, reintubated x 2, transfuse PRBC 9/5 Trach 9/6 PEG 9/15 abcess in rectal area, surgery consulted  Cultures/antimicrobials: CSF 8/24: negative BCX 8/24: negative UCX 8/24: negative Sputum 8/24: S. Agalactiae Sputum 9/1: Normal resp. flora  Vancomycin 8/24 - 8/26 Zosyn 8/24 - 8/26, 9/1 - 9/8 Ampicillin 8/27 - 8/30 vanc 9/15 9/18 Zosyn 9/16 9/18 Clindamycin 9/18   Antimicrobials:  Anti-infectives    Start     Dose/Rate Route Frequency Ordered Stop   04/26/17 0600  vancomycin (VANCOCIN) IVPB 750  mg/150 ml premix  Status:  Discontinued     750 mg 150 mL/hr over 60 Minutes Intravenous Every 12 hours 04/25/17 1729 04/25/17 1730   04/25/17 1830  clindamycin (CLEOCIN) capsule 300 mg     300 mg Oral Every 6 hours 04/25/17 1730 04/29/17 2359   04/25/17  1800  vancomycin (VANCOCIN) IVPB 750 mg/150 ml premix  Status:  Discontinued     750 mg 150 mL/hr over 60 Minutes Intravenous Every 12 hours 04/25/17 1728 04/25/17 1729   04/23/17 0945  piperacillin-tazobactam (ZOSYN) IVPB 3.375 g  Status:  Discontinued     3.375 g 12.5 mL/hr over 240 Minutes Intravenous Every 8 hours 04/23/17 0930 04/25/17 1730   04/23/17 0430  vancomycin (VANCOCIN) IVPB 1000 mg/200 mL premix  Status:  Discontinued     1,000 mg 200 mL/hr over 60 Minutes Intravenous Every 12 hours 04/22/17 1655 04/25/17 1728   04/22/17 1630  vancomycin (VANCOCIN) IVPB 1000 mg/200 mL premix     1,000 mg 200 mL/hr over 60 Minutes Intravenous  Once 04/22/17 1627 04/22/17 1826   04/08/17 0200  piperacillin-tazobactam (ZOSYN) IVPB 3.375 g  Status:  Discontinued     3.375 g 12.5 mL/hr over 240 Minutes Intravenous Every 8 hours 04/08/17 0058 04/15/17 1601   04/08/17 0100  piperacillin-tazobactam (ZOSYN) IVPB 3.375 g  Status:  Discontinued     3.375 g 100 mL/hr over 30 Minutes Intravenous Every 6 hours 04/08/17 0054 04/08/17 0057   04/03/17 1200  ampicillin (OMNIPEN) 2 g in sodium chloride 0.9 % 50 mL IVPB     2 g 150 mL/hr over 20 Minutes Intravenous Every 6 hours 04/03/17 1005 04/06/17 1837   04/01/17 1800  vancomycin (VANCOCIN) IVPB 1000 mg/200 mL premix  Status:  Discontinued     1,000 mg 200 mL/hr over 60 Minutes Intravenous Every 24 hours 04/01/17 0146 04/01/17 0154   04/01/17 1600  ceFAZolin (ANCEF) IVPB 2g/100 mL premix     2 g 200 mL/hr over 30 Minutes Intravenous Every 8 hours 04/01/17 1100 04/02/17 0056   04/01/17 0828  bacitracin 50,000 Units in sodium chloride irrigation 0.9 % 500 mL irrigation  Status:  Discontinued       As needed 04/01/17 0837 04/01/17 0947   04/01/17 0600  piperacillin-tazobactam (ZOSYN) IVPB 3.375 g  Status:  Discontinued     3.375 g 12.5 mL/hr over 240 Minutes Intravenous Every 8 hours 04/01/17 0146 04/01/17 0154   04/01/17 0600  vancomycin (VANCOCIN) IVPB  750 mg/150 ml premix  Status:  Discontinued     750 mg 150 mL/hr over 60 Minutes Intravenous Every 12 hours 04/01/17 0225 04/03/17 0959   04/01/17 0400  piperacillin-tazobactam (ZOSYN) IVPB 3.375 g  Status:  Discontinued     3.375 g 12.5 mL/hr over 240 Minutes Intravenous Every 8 hours 04/01/17 0225 04/03/17 0959       Subjective:  Persistent headache, 5 out of 10. He received 1 dose of Tylenol - dose of ibuprofen over the last 24 hours. His leg is feeling better. He has some cough. Denies shortness of breath, nausea.   Objective: Vitals:   04/28/17 0854 04/28/17 1026 04/28/17 1214 04/28/17 1421  BP: 114/77 119/77  97/77  Pulse: 99 94 94 (!) 118  Resp: 16 20 18 20   Temp:  99 F (37.2 C)  98.3 F (36.8 C)  TempSrc:  Oral  Oral  SpO2: 96% 99% 95% 99%  Weight:      Height:  Intake/Output Summary (Last 24 hours) at 04/28/17 1540 Last data filed at 04/28/17 1100  Gross per 24 hour  Intake              100 ml  Output             1625 ml  Net            -1525 ml   Filed Weights   04/25/17 0412 04/26/17 0416 04/26/17 2055  Weight: 88.4 kg (194 lb 14.2 oz) 85.5 kg (188 lb 7.9 oz) 88.7 kg (195 lb 8.8 oz)    Examination:  General exam:  Adult male.  No acute distress.  HEENT:  Bulging of the right temporal, parietal and occipital areas where craniotomy was previously performed, MMM Respiratory system: Coarse bilateral breath sounds, no wheezes or focal rales Cardiovascular system: Regular rate and rhythm, normal S1/S2. No murmurs, rubs, gallops or clicks.  Warm extremities Gastrointestinal system: Normal active bowel sounds, soft, nondistended, nontender. PEG tube with tube feeds running MSK:  Normal tone and bulk, no lower extremity edema Neuro:  Left hemicolectomy neglect, left hemiparesis Anal area not observed today  Data Reviewed: I have personally reviewed following labs and imaging studies  CBC:  Recent Labs Lab 04/23/17 0904 04/24/17 0424 04/25/17 0337  04/26/17 0340  WBC 14.0* 15.9* 16.0* 16.0*  NEUTROABS  --  9.9* 10.3* 10.7*  HGB 10.5* 9.5* 9.5* 10.7*  HCT 34.6* 31.8* 31.9* 35.3*  MCV 94.5 94.4 94.4 95.1  PLT 515* 607* 594* 810*   Basic Metabolic Panel:  Recent Labs Lab 04/24/17 0424 04/25/17 0337 04/26/17 0340  NA 134* 134* 137  K 4.4 4.3 4.4  CL 101 99* 102  CO2 27 26 29   GLUCOSE 164* 174* 167*  BUN 27* 27* 29*  CREATININE 0.92 1.02 0.99  CALCIUM 10.1 10.3 10.9*   GFR: Estimated Creatinine Clearance: 93.5 mL/min (by C-G formula based on SCr of 0.99 mg/dL). Liver Function Tests:  Recent Labs Lab 04/24/17 0424 04/25/17 0337 04/26/17 0340  AST 23 20 20   ALT 23 24 24   ALKPHOS 67 66 73  BILITOT 0.4 0.3 0.2*  PROT 8.0 8.1 8.3*  ALBUMIN 2.3* 2.3* 2.5*   No results for input(s): LIPASE, AMYLASE in the last 168 hours. No results for input(s): AMMONIA in the last 168 hours. Coagulation Profile: No results for input(s): INR, PROTIME in the last 168 hours. Cardiac Enzymes: No results for input(s): CKTOTAL, CKMB, CKMBINDEX, TROPONINI in the last 168 hours. BNP (last 3 results) No results for input(s): PROBNP in the last 8760 hours. HbA1C: No results for input(s): HGBA1C in the last 72 hours. CBG:  Recent Labs Lab 04/27/17 1935 04/27/17 2338 04/28/17 0429 04/28/17 0752 04/28/17 1135  GLUCAP 135* 127* 150* 156* 127*   Lipid Profile: No results for input(s): CHOL, HDL, LDLCALC, TRIG, CHOLHDL, LDLDIRECT in the last 72 hours. Thyroid Function Tests: No results for input(s): TSH, T4TOTAL, FREET4, T3FREE, THYROIDAB in the last 72 hours. Anemia Panel: No results for input(s): VITAMINB12, FOLATE, FERRITIN, TIBC, IRON, RETICCTPCT in the last 72 hours. Urine analysis:    Component Value Date/Time   COLORURINE YELLOW (A) 03/31/2017 0945   APPEARANCEUR CLOUDY (A) 03/31/2017 0945   LABSPEC 1.018 03/31/2017 0945   PHURINE 5.0 03/31/2017 0945   GLUCOSEU 50 (A) 03/31/2017 0945   HGBUR MODERATE (A) 03/31/2017 0945     BILIRUBINUR NEGATIVE 03/31/2017 0945   KETONESUR NEGATIVE 03/31/2017 0945   PROTEINUR 30 (A) 03/31/2017 0945   NITRITE NEGATIVE  03/31/2017 0945   LEUKOCYTESUR NEGATIVE 03/31/2017 0945   Sepsis Labs: @LABRCNTIP (procalcitonin:4,lacticidven:4)  ) Recent Results (from the past 240 hour(s))  Culture, blood (routine x 2)     Status: None   Collection Time: 04/22/17  4:50 PM  Result Value Ref Range Status   Specimen Description BLOOD LEFT HAND  Final   Special Requests IN PEDIATRIC BOTTLE Blood Culture adequate volume  Final   Culture NO GROWTH 5 DAYS  Final   Report Status 04/27/2017 FINAL  Final  Culture, blood (routine x 2)     Status: None   Collection Time: 04/22/17  4:57 PM  Result Value Ref Range Status   Specimen Description BLOOD LEFT HAND  Final   Special Requests IN PEDIATRIC BOTTLE Blood Culture adequate volume  Final   Culture NO GROWTH 5 DAYS  Final   Report Status 04/27/2017 FINAL  Final      Radiology Studies: Dg Swallowing Func-speech Pathology  Result Date: 04/28/2017 Objective Swallowing Evaluation: Type of Study: MBS-Modified Barium Swallow Study Patient Details Name: Joe Wilkinson MRN: [id number] Date of Birth: 09/13/1960 Today's Date: 04/28/2017 Time: SLP Start Time (ACUTE ONLY): 0939-SLP Stop Time (ACUTE ONLY): 1003 SLP Time Calculation (min) (ACUTE ONLY): 24 min Past Medical History: No past medical history on file. Past Surgical History: Past Surgical History: Procedure Laterality Date . CRANIECTOMY Right 04/01/2017  Procedure: RIGHT DECOMPRESSIVE CRANIECTOMY;  Surgeon: Ditty, Kevan Ny, MD;  Location: [facility];  Service: Neurosurgery;  Laterality: Right; . ESOPHAGOGASTRODUODENOSCOPY N/A 04/13/2017  Procedure: ESOPHAGOGASTRODUODENOSCOPY (EGD);  Surgeon: Georganna Skeans, MD;  Location: [place];  Service: General;  Laterality: N/A;  bedside . PEG PLACEMENT N/A 04/13/2017  Procedure: PERCUTANEOUS ENDOSCOPIC GASTROSTOMY (PEG) PLACEMENT;  Surgeon: Georganna Skeans, MD;   Location: [facility] ENDOSCOPY;  Service: General;  Laterality: N/A; HPI: 56 yo male brought to [facility] with altered mental status from DKA and sepsis. Found to have RT MCA CVA and transfer to [facility]. Required Rt decompressive craniectomy and remained on vent post op.ETT 8/23-9/5, s/p tracheostomy 9/5.  Subjective: pt eager for POs Assessment / Plan / Recommendation CHL IP CLINICAL IMPRESSIONS 04/28/2017 Clinical Impression Pt has a severe oral and moderate pharyngeal dysphagia. Significant oral holding and anteropr/posterior rocking is observed, especially with thicker textures. Most of the puree was orally suctioned after not being able to initiate a/p transfer given Max cues, significant extra time, and small amounts of honey thick liquids as a liquid wash. Small amounts had already fallen posteriorly to the pharynx without a swallow triggered. He moved thin and nectar thick liquids more readily into his pharynx, but a delay in swallow and incomplete glottal closure allowed for silent aspiration before/druing the swallow.  Pt will not cough to command because it makes his head hurt. Small spoonfuls of thin liquid, even after pooling in his pyriform sinuses, did not enter the airway. Recommend allowing a few ice chips, given one at a time by staff, following oral care to provide pt comfort and increase use of swallowing musculature. Would not leave patient with a cup of ice as he would be impulsive with his intake and has trouble consistently initiating a swallow. SLP will continue to follow. SLP Visit Diagnosis Dysphagia, oropharyngeal phase (R13.12) Attention and concentration deficit following -- Frontal lobe and executive function deficit following -- Impact on safety and function Moderate aspiration risk;Severe aspiration risk;Risk for inadequate nutrition/hydration   CHL IP TREATMENT RECOMMENDATION 04/28/2017 Treatment Recommendations Therapy as outlined in treatment plan below   Prognosis 04/28/2017 Prognosis for Safe  Diet Advancement Good Barriers to Reach Goals Severity of deficits Barriers/Prognosis Comment -- CHL IP DIET RECOMMENDATION 04/28/2017 SLP Diet Recommendations NPO;Ice chips PRN after oral care Liquid Administration via -- Medication Administration Via alternative means Compensations -- Postural Changes --   CHL IP OTHER RECOMMENDATIONS 04/28/2017 Recommended Consults -- Oral Care Recommendations Oral care QID;Other (Comment) Other Recommendations Have oral suction available   CHL IP FOLLOW UP RECOMMENDATIONS 04/28/2017 Follow up Recommendations Skilled Nursing facility   [facility] IP FREQUENCY AND DURATION 04/28/2017 Speech Therapy Frequency (ACUTE ONLY) min 2x/week Treatment Duration 2 weeks      CHL IP ORAL PHASE 04/28/2017 Oral Phase Impaired Oral - Pudding Teaspoon -- Oral - Pudding Cup -- Oral - Honey Teaspoon Left anterior bolus loss;Weak lingual manipulation;Reduced posterior propulsion;Holding of bolus;Lingual/palatal residue;Delayed oral transit;Decreased bolus cohesion Oral - Honey Cup -- Oral - Nectar Teaspoon -- Oral - Nectar Cup -- Oral - Nectar Straw Left anterior bolus loss;Weak lingual manipulation;Reduced posterior propulsion;Holding of bolus;Lingual/palatal residue;Delayed oral transit;Decreased bolus cohesion Oral - Thin Teaspoon Left anterior bolus loss;Weak lingual manipulation;Reduced posterior propulsion;Holding of bolus;Lingual/palatal residue;Delayed oral transit;Decreased bolus cohesion Oral - Thin Cup -- Oral - Thin Straw Left anterior bolus loss;Weak lingual manipulation;Reduced posterior propulsion;Holding of bolus;Lingual/palatal residue;Delayed oral transit;Decreased bolus cohesion Oral - Puree Left anterior bolus loss;Weak lingual manipulation;Reduced posterior propulsion;Holding of bolus;Lingual/palatal residue;Delayed oral transit;Decreased bolus cohesion Oral - Mech Soft -- Oral - Regular -- Oral - Multi-Consistency -- Oral - Pill -- Oral Phase - Comment --  CHL IP PHARYNGEAL PHASE 04/28/2017  Pharyngeal Phase Impaired Pharyngeal- Pudding Teaspoon -- Pharyngeal -- Pharyngeal- Pudding Cup -- Pharyngeal -- Pharyngeal- Honey Teaspoon Delayed swallow initiation-pyriform sinuses;Reduced airway/laryngeal closure Pharyngeal -- Pharyngeal- Honey Cup -- Pharyngeal -- Pharyngeal- Nectar Teaspoon -- Pharyngeal -- Pharyngeal- Nectar Cup -- Pharyngeal -- Pharyngeal- Nectar Straw Delayed swallow initiation-pyriform sinuses;Reduced airway/laryngeal closure;Penetration/Aspiration during swallow Pharyngeal Material enters airway, passes BELOW cords without attempt by patient to eject out (silent aspiration) Pharyngeal- Thin Teaspoon Delayed swallow initiation-pyriform sinuses;Reduced airway/laryngeal closure Pharyngeal -- Pharyngeal- Thin Cup -- Pharyngeal -- Pharyngeal- Thin Straw Delayed swallow initiation-pyriform sinuses;Reduced airway/laryngeal closure;Penetration/Aspiration before swallow Pharyngeal Material enters airway, passes BELOW cords without attempt by patient to eject out (silent aspiration) Pharyngeal- Puree Delayed swallow initiation-pyriform sinuses;Reduced airway/laryngeal closure Pharyngeal -- Pharyngeal- Mechanical Soft -- Pharyngeal -- Pharyngeal- Regular -- Pharyngeal -- Pharyngeal- Multi-consistency -- Pharyngeal -- Pharyngeal- Pill -- Pharyngeal -- Pharyngeal Comment --  CHL IP CERVICAL ESOPHAGEAL PHASE 04/28/2017 Cervical Esophageal Phase WFL Pudding Teaspoon -- Pudding Cup -- Honey Teaspoon -- Honey Cup -- Nectar Teaspoon -- Nectar Cup -- Nectar Straw -- Thin Teaspoon -- Thin Cup -- Thin Straw -- Puree -- Mechanical Soft -- Regular -- Multi-consistency -- Pill -- Cervical Esophageal Comment -- No flowsheet data found. [name] 04/28/2017, 11:05 AM  [name], M.A. CCC-SLP [phone]               Scheduled Meds: . acetaminophen (TYLENOL) oral liquid 160 mg/5 mL  650 mg Per Tube TID  . aspirin  325 mg Per Tube Daily  . chlorhexidine  15 mL Mouth Rinse BID  . clindamycin   300 mg Oral Q6H  . feeding supplement (PRO-STAT SUGAR FREE 64)  30 mL Per Tube BID  . glycopyrrolate  1 mg Oral BID  . heparin subcutaneous  5,000 Units Subcutaneous Q8H  . insulin aspart  0-20 Units Subcutaneous Q4H  . insulin detemir  34 Units Subcutaneous Daily  . mouth rinse  15 mL Mouth Rinse q12n4p  .  metoprolol succinate  12.5 mg Oral Daily  . pantoprazole sodium  40 mg Per Tube Daily   Continuous Infusions: . feeding supplement (GLUCERNA 1.2 CAL) 1,000 mL (04/28/17 1527)     LOS: 27 days    Time spent: 30 min    Janece Canterbury, MD [facility] Pager [phone]  If 7PM-7AM, please contact night-coverage [email or web address] Password [email or web address] [facility] 04/28/2017, 3:40 PM

## 2017-04-29 LAB — GLUCOSE, CAPILLARY
GLUCOSE-CAPILLARY: 125 mg/dL — AB (ref 65–99)
GLUCOSE-CAPILLARY: 132 mg/dL — AB (ref 65–99)
GLUCOSE-CAPILLARY: 135 mg/dL — AB (ref 65–99)
GLUCOSE-CAPILLARY: 146 mg/dL — AB (ref 65–99)
GLUCOSE-CAPILLARY: 152 mg/dL — AB (ref 65–99)
GLUCOSE-CAPILLARY: 156 mg/dL — AB (ref 65–99)

## 2017-04-29 NOTE — Procedures (Signed)
Tracheostomy Change Note  Patient Details:   Name: Joe Wilkinson DOB: 29-Jul-1961 MRN: [id number]    Airway Documentation:     Evaluation  O2 sats: stable throughout Complications: No apparent complications Patient did tolerate procedure well. Bilateral Breath Sounds: Rhonchi   + easy cap color change (purple to yellow). No distress noted.   Spoke w/ Network engineer re: ordering spare #4 cuffless shiley  trach for room.  Lenna Sciara 04/29/2017, 3:07 PM

## 2017-04-29 NOTE — Progress Notes (Signed)
Came to room earlier to change/downsize trach but pt was being cleaned up.  Came again to room for trach change, but this RT being called away to another room.  Will attempt to change trach later today.

## 2017-04-29 NOTE — Clinical Social Work Note (Signed)
CSW continues to follow for discharge needs.  Sidney Kann, CSW [phone]  

## 2017-04-29 NOTE — Progress Notes (Signed)
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  S/p decompressive right craniectomy 8/25.  Hypertonic saline was started per neurology recommendations and has been stopped He required transfusion support and was treated for AFib with RVR, continued on the ventilator, eventually receiving trach 9/5 and PEG 9/6, on ATC 9/7.   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)   Ventilator dependent ([place])   Endotracheal tube present   Encounter for tracheostomy tube change ([place])   Acute blood loss anemia   Headache   AF (paroxysmal atrial fibrillation) ([facility])  Acute hypoxic and hypercarbic respiratory failure:  - Treated for S. agalactiae PNA, then restarted on zosyn 9/1 - 9/8 due to concern for aspiration.  - Continue trach - Plan to downsize to 4 cuffless trach today - Repeat chest x-ray 9/17 because of dislodged tracheotomy, showed no lung disease actively  Septic shock due to S. agalactiae pneumonia: -Resolved, completed abx 8/30.    Peri-anal cellulitis with small abcess, almost completely resolved Gen surgery evaluated and determined no surgical drainage necessary -  Today is last day of clindamycin  Massive right MCA territory infarct with PCA watershed infarct, subsequent cytotoxic edema requiring decompressive craniectomy, hypertonic saline, now with tracheostomy and PEG - ASA per neurology - Now trach/PEG-dependent. Tube feeding at goal - Follow up with neurosurgery in 3 months to assess for  cranioplasty - scheduled Tylenol and occasional ibuprofen for headache  AFib with RVR, CHad2Vasc2 score=4, likely caused by acute illness - has maintained NSR, no longer on telemetry - Continue ASA, not on full dose a/c due to recent anemia requiring 4 units of PRBC - Continue metprolol  Acute anemia, hemoglobin gradually improving - CT abdomen negative, no gross GI bleeding. EGD at time of PEG placement showed normal esophagus, stomach, duodenal bulb. Required transfusions x4.  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   DVT prophylaxis:  heparin  Code Status: Full Family Communication: None at bedside  Disposition Plan: awaiting placement at SNF  Consultants:   PCCM  Neurology  Neurosurgery  gen surg  SIGNIFICANT EVENTS: 8/23 Admit [facility] 8/23 8/24 Transfer to [facility] after Rt MCA CVA, start 3% NS 8/25 Decompressive craniectomy 8/27 Transfuse 1 unit PRBC 8/28 Transfuse 1 unit PRBC, A fib with RVR 8/30 Transfuse 1 unit PRBC 8/31 Off pressors, reintubated x 2, transfuse PRBC 9/5 Trach 9/6 PEG 9/15 abcess in rectal area, surgery consulted  Cultures/antimicrobials: CSF 8/24: negative BCX 8/24: negative UCX 8/24: negative Sputum 8/24: S. Agalactiae Sputum 9/1: Normal resp. flora  Vancomycin 8/24 - 8/26 Zosyn 8/24 - 8/26, 9/1 - 9/8 Ampicillin 8/27 - 8/30 vanc 9/15 9/18 Zosyn 9/16 9/18 Clindamycin 9/18   Antimicrobials:  Anti-infectives    Start     Dose/Rate Route Frequency Ordered Stop   04/26/17 0600  vancomycin (VANCOCIN)  IVPB 750 mg/150 ml premix  Status:  Discontinued     750 mg 150 mL/hr over 60 Minutes Intravenous Every 12 hours 04/25/17 1729 04/25/17 1730   04/25/17 1830  clindamycin (CLEOCIN) capsule 300 mg     300 mg Oral Every 6 hours 04/25/17 1730 04/29/17 2359   04/25/17  1800  vancomycin (VANCOCIN) IVPB 750 mg/150 ml premix  Status:  Discontinued     750 mg 150 mL/hr over 60 Minutes Intravenous Every 12 hours 04/25/17 1728 04/25/17 1729   04/23/17 0945  piperacillin-tazobactam (ZOSYN) IVPB 3.375 g  Status:  Discontinued     3.375 g 12.5 mL/hr over 240 Minutes Intravenous Every 8 hours 04/23/17 0930 04/25/17 1730   04/23/17 0430  vancomycin (VANCOCIN) IVPB 1000 mg/200 mL premix  Status:  Discontinued     1,000 mg 200 mL/hr over 60 Minutes Intravenous Every 12 hours 04/22/17 1655 04/25/17 1728   04/22/17 1630  vancomycin (VANCOCIN) IVPB 1000 mg/200 mL premix     1,000 mg 200 mL/hr over 60 Minutes Intravenous  Once 04/22/17 1627 04/22/17 1826   04/08/17 0200  piperacillin-tazobactam (ZOSYN) IVPB 3.375 g  Status:  Discontinued     3.375 g 12.5 mL/hr over 240 Minutes Intravenous Every 8 hours 04/08/17 0058 04/15/17 1601   04/08/17 0100  piperacillin-tazobactam (ZOSYN) IVPB 3.375 g  Status:  Discontinued     3.375 g 100 mL/hr over 30 Minutes Intravenous Every 6 hours 04/08/17 0054 04/08/17 0057   04/03/17 1200  ampicillin (OMNIPEN) 2 g in sodium chloride 0.9 % 50 mL IVPB     2 g 150 mL/hr over 20 Minutes Intravenous Every 6 hours 04/03/17 1005 04/06/17 1837   04/01/17 1800  vancomycin (VANCOCIN) IVPB 1000 mg/200 mL premix  Status:  Discontinued     1,000 mg 200 mL/hr over 60 Minutes Intravenous Every 24 hours 04/01/17 0146 04/01/17 0154   04/01/17 1600  ceFAZolin (ANCEF) IVPB 2g/100 mL premix     2 g 200 mL/hr over 30 Minutes Intravenous Every 8 hours 04/01/17 1100 04/02/17 0056   04/01/17 0828  bacitracin 50,000 Units in sodium chloride irrigation 0.9 % 500 mL irrigation  Status:  Discontinued       As needed 04/01/17 0837 04/01/17 0947   04/01/17 0600  piperacillin-tazobactam (ZOSYN) IVPB 3.375 g  Status:  Discontinued     3.375 g 12.5 mL/hr over 240 Minutes Intravenous Every 8 hours 04/01/17 0146 04/01/17 0154   04/01/17 0600  vancomycin (VANCOCIN) IVPB  750 mg/150 ml premix  Status:  Discontinued     750 mg 150 mL/hr over 60 Minutes Intravenous Every 12 hours 04/01/17 0225 04/03/17 0959   04/01/17 0400  piperacillin-tazobactam (ZOSYN) IVPB 3.375 g  Status:  Discontinued     3.375 g 12.5 mL/hr over 240 Minutes Intravenous Every 8 hours 04/01/17 0225 04/03/17 0959       Subjective:  Headache is markedly improved on scheduled Tylenol.  Denies chest pains, difficulty breathing, nausea. Bowels are moving regularly. Has condom catheter on.  Objective: Vitals:   04/29/17 0948 04/29/17 1140 04/29/17 1400 04/29/17 1455  BP: 114/76  133/80   Pulse: (!) 107 100 89 (!) 116  Resp: 20 (!) 22 20 18   Temp: 98.8 F (37.1 C)  98.6 F (37 C)   TempSrc: Oral  Oral   SpO2: 95% 99% 100% 100%  Weight:      Height:        Intake/Output Summary (Last 24 hours) at 04/29/17 1535 Last  data filed at 04/29/17 1400  Gross per 24 hour  Intake               60 ml  Output              900 ml  Net             -840 ml   Filed Weights   04/25/17 0412 04/26/17 0416 04/26/17 2055  Weight: 88.4 kg (194 lb 14.2 oz) 85.5 kg (188 lb 7.9 oz) 88.7 kg (195 lb 8.8 oz)    Examination:  General exam:  Adult Male.  No acute distress.  HEENT:  Bulging of the right temporal, parietal, and axillary areas, MMM Respiratory system: Rhonchorous breath sounds, no rales or wheeze Cardiovascular system: Regular rate and rhythm, normal S1/S2. No murmurs, rubs, gallops or clicks.  Warm extremities Gastrointestinal system: Normal active bowel sounds, soft, nondistended, nontender. PEG tube MSK:  Normal tone and bulk, no lower extremity edema Neuro:  Left hemiparesis  Data Reviewed: I have personally reviewed following labs and imaging studies  CBC:  Recent Labs Lab 04/23/17 0904 04/24/17 0424 04/25/17 0337 04/26/17 0340  WBC 14.0* 15.9* 16.0* 16.0*  NEUTROABS  --  9.9* 10.3* 10.7*  HGB 10.5* 9.5* 9.5* 10.7*  HCT 34.6* 31.8* 31.9* 35.3*  MCV 94.5 94.4 94.4 95.1    PLT 515* 607* 594* 106*   Basic Metabolic Panel:  Recent Labs Lab 04/24/17 0424 04/25/17 0337 04/26/17 0340  NA 134* 134* 137  K 4.4 4.3 4.4  CL 101 99* 102  CO2 27 26 29   GLUCOSE 164* 174* 167*  BUN 27* 27* 29*  CREATININE 0.92 1.02 0.99  CALCIUM 10.1 10.3 10.9*   GFR: Estimated Creatinine Clearance: 93.5 mL/min (by C-G formula based on SCr of 0.99 mg/dL). Liver Function Tests:  Recent Labs Lab 04/24/17 0424 04/25/17 0337 04/26/17 0340  AST 23 20 20   ALT 23 24 24   ALKPHOS 67 66 73  BILITOT 0.4 0.3 0.2*  PROT 8.0 8.1 8.3*  ALBUMIN 2.3* 2.3* 2.5*   No results for input(s): LIPASE, AMYLASE in the last 168 hours. No results for input(s): AMMONIA in the last 168 hours. Coagulation Profile: No results for input(s): INR, PROTIME in the last 168 hours. Cardiac Enzymes: No results for input(s): CKTOTAL, CKMB, CKMBINDEX, TROPONINI in the last 168 hours. BNP (last 3 results) No results for input(s): PROBNP in the last 8760 hours. HbA1C: No results for input(s): HGBA1C in the last 72 hours. CBG:  Recent Labs Lab 04/28/17 2016 04/29/17 0012 04/29/17 0408 04/29/17 0743 04/29/17 1105  GLUCAP 140* 152* 135* 146* 132*   Lipid Profile: No results for input(s): CHOL, HDL, LDLCALC, TRIG, CHOLHDL, LDLDIRECT in the last 72 hours. Thyroid Function Tests: No results for input(s): TSH, T4TOTAL, FREET4, T3FREE, THYROIDAB in the last 72 hours. Anemia Panel: No results for input(s): VITAMINB12, FOLATE, FERRITIN, TIBC, IRON, RETICCTPCT in the last 72 hours. Urine analysis:    Component Value Date/Time   COLORURINE YELLOW (A) 03/31/2017 0945   APPEARANCEUR CLOUDY (A) 03/31/2017 0945   LABSPEC 1.018 03/31/2017 0945   PHURINE 5.0 03/31/2017 0945   GLUCOSEU 50 (A) 03/31/2017 0945   HGBUR MODERATE (A) 03/31/2017 0945   BILIRUBINUR NEGATIVE 03/31/2017 0945   KETONESUR NEGATIVE 03/31/2017 0945   PROTEINUR 30 (A) 03/31/2017 0945   NITRITE NEGATIVE 03/31/2017 0945    LEUKOCYTESUR NEGATIVE 03/31/2017 0945   Sepsis Labs: @LABRCNTIP (procalcitonin:4,lacticidven:4)  ) Recent Results (from the past 240 hour(s))  Culture, blood (  routine x 2)     Status: None   Collection Time: 04/22/17  4:50 PM  Result Value Ref Range Status   Specimen Description BLOOD LEFT HAND  Final   Special Requests IN PEDIATRIC BOTTLE Blood Culture adequate volume  Final   Culture NO GROWTH 5 DAYS  Final   Report Status 04/27/2017 FINAL  Final  Culture, blood (routine x 2)     Status: None   Collection Time: 04/22/17  4:57 PM  Result Value Ref Range Status   Specimen Description BLOOD LEFT HAND  Final   Special Requests IN PEDIATRIC BOTTLE Blood Culture adequate volume  Final   Culture NO GROWTH 5 DAYS  Final   Report Status 04/27/2017 FINAL  Final      Radiology Studies: Dg Swallowing Func-speech Pathology  Result Date: 04/28/2017 Objective Swallowing Evaluation: Type of Study: MBS-Modified Barium Swallow Study Patient Details Name: Zygmund Passero MRN: [id number] Date of Birth: 03-Sep-1960 Today's Date: 04/28/2017 Time: SLP Start Time (ACUTE ONLY): 0939-SLP Stop Time (ACUTE ONLY): 1003 SLP Time Calculation (min) (ACUTE ONLY): 24 min Past Medical History: No past medical history on file. Past Surgical History: Past Surgical History: Procedure Laterality Date . CRANIECTOMY Right 04/01/2017  Procedure: RIGHT DECOMPRESSIVE CRANIECTOMY;  Surgeon: Ditty, Kevan Ny, MD;  Location: [place];  Service: Neurosurgery;  Laterality: Right; . ESOPHAGOGASTRODUODENOSCOPY N/A 04/13/2017  Procedure: ESOPHAGOGASTRODUODENOSCOPY (EGD);  Surgeon: Georganna Skeans, MD;  Location: [place];  Service: General;  Laterality: N/A;  bedside . PEG PLACEMENT N/A 04/13/2017  Procedure: PERCUTANEOUS ENDOSCOPIC GASTROSTOMY (PEG) PLACEMENT;  Surgeon: Georganna Skeans, MD;  Location: [facility] ENDOSCOPY;  Service: General;  Laterality: N/A; HPI: 56 yo male brought to [facility] with altered mental status from DKA and sepsis. Found to  have RT MCA CVA and transfer to [facility]. Required Rt decompressive craniectomy and remained on vent post op.ETT 8/23-9/5, s/p tracheostomy 9/5.  Subjective: pt eager for POs Assessment / Plan / Recommendation CHL IP CLINICAL IMPRESSIONS 04/28/2017 Clinical Impression Pt has a severe oral and moderate pharyngeal dysphagia. Significant oral holding and anteropr/posterior rocking is observed, especially with thicker textures. Most of the puree was orally suctioned after not being able to initiate a/p transfer given Max cues, significant extra time, and small amounts of honey thick liquids as a liquid wash. Small amounts had already fallen posteriorly to the pharynx without a swallow triggered. He moved thin and nectar thick liquids more readily into his pharynx, but a delay in swallow and incomplete glottal closure allowed for silent aspiration before/druing the swallow.  Pt will not cough to command because it makes his head hurt. Small spoonfuls of thin liquid, even after pooling in his pyriform sinuses, did not enter the airway. Recommend allowing a few ice chips, given one at a time by staff, following oral care to provide pt comfort and increase use of swallowing musculature. Would not leave patient with a cup of ice as he would be impulsive with his intake and has trouble consistently initiating a swallow. SLP will continue to follow. SLP Visit Diagnosis Dysphagia, oropharyngeal phase (R13.12) Attention and concentration deficit following -- Frontal lobe and executive function deficit following -- Impact on safety and function Moderate aspiration risk;Severe aspiration risk;Risk for inadequate nutrition/hydration   CHL IP TREATMENT RECOMMENDATION 04/28/2017 Treatment Recommendations Therapy as outlined in treatment plan below   Prognosis 04/28/2017 Prognosis for Safe Diet Advancement Good Barriers to Reach Goals Severity of deficits Barriers/Prognosis Comment -- CHL IP DIET RECOMMENDATION 04/28/2017 SLP Diet  Recommendations NPO;Ice chips PRN  after oral care Liquid Administration via -- Medication Administration Via alternative means Compensations -- Postural Changes --   CHL IP OTHER RECOMMENDATIONS 04/28/2017 Recommended Consults -- Oral Care Recommendations Oral care QID;Other (Comment) Other Recommendations Have oral suction available   CHL IP FOLLOW UP RECOMMENDATIONS 04/28/2017 Follow up Recommendations Skilled Nursing facility   [facility] IP FREQUENCY AND DURATION 04/28/2017 Speech Therapy Frequency (ACUTE ONLY) min 2x/week Treatment Duration 2 weeks      CHL IP ORAL PHASE 04/28/2017 Oral Phase Impaired Oral - Pudding Teaspoon -- Oral - Pudding Cup -- Oral - Honey Teaspoon Left anterior bolus loss;Weak lingual manipulation;Reduced posterior propulsion;Holding of bolus;Lingual/palatal residue;Delayed oral transit;Decreased bolus cohesion Oral - Honey Cup -- Oral - Nectar Teaspoon -- Oral - Nectar Cup -- Oral - Nectar Straw Left anterior bolus loss;Weak lingual manipulation;Reduced posterior propulsion;Holding of bolus;Lingual/palatal residue;Delayed oral transit;Decreased bolus cohesion Oral - Thin Teaspoon Left anterior bolus loss;Weak lingual manipulation;Reduced posterior propulsion;Holding of bolus;Lingual/palatal residue;Delayed oral transit;Decreased bolus cohesion Oral - Thin Cup -- Oral - Thin Straw Left anterior bolus loss;Weak lingual manipulation;Reduced posterior propulsion;Holding of bolus;Lingual/palatal residue;Delayed oral transit;Decreased bolus cohesion Oral - Puree Left anterior bolus loss;Weak lingual manipulation;Reduced posterior propulsion;Holding of bolus;Lingual/palatal residue;Delayed oral transit;Decreased bolus cohesion Oral - Mech Soft -- Oral - Regular -- Oral - Multi-Consistency -- Oral - Pill -- Oral Phase - Comment --  CHL IP PHARYNGEAL PHASE 04/28/2017 Pharyngeal Phase Impaired Pharyngeal- Pudding Teaspoon -- Pharyngeal -- Pharyngeal- Pudding Cup -- Pharyngeal -- Pharyngeal- Honey Teaspoon  Delayed swallow initiation-pyriform sinuses;Reduced airway/laryngeal closure Pharyngeal -- Pharyngeal- Honey Cup -- Pharyngeal -- Pharyngeal- Nectar Teaspoon -- Pharyngeal -- Pharyngeal- Nectar Cup -- Pharyngeal -- Pharyngeal- Nectar Straw Delayed swallow initiation-pyriform sinuses;Reduced airway/laryngeal closure;Penetration/Aspiration during swallow Pharyngeal Material enters airway, passes BELOW cords without attempt by patient to eject out (silent aspiration) Pharyngeal- Thin Teaspoon Delayed swallow initiation-pyriform sinuses;Reduced airway/laryngeal closure Pharyngeal -- Pharyngeal- Thin Cup -- Pharyngeal -- Pharyngeal- Thin Straw Delayed swallow initiation-pyriform sinuses;Reduced airway/laryngeal closure;Penetration/Aspiration before swallow Pharyngeal Material enters airway, passes BELOW cords without attempt by patient to eject out (silent aspiration) Pharyngeal- Puree Delayed swallow initiation-pyriform sinuses;Reduced airway/laryngeal closure Pharyngeal -- Pharyngeal- Mechanical Soft -- Pharyngeal -- Pharyngeal- Regular -- Pharyngeal -- Pharyngeal- Multi-consistency -- Pharyngeal -- Pharyngeal- Pill -- Pharyngeal -- Pharyngeal Comment --  CHL IP CERVICAL ESOPHAGEAL PHASE 04/28/2017 Cervical Esophageal Phase WFL Pudding Teaspoon -- Pudding Cup -- Honey Teaspoon -- Honey Cup -- Nectar Teaspoon -- Nectar Cup -- Nectar Straw -- Thin Teaspoon -- Thin Cup -- Thin Straw -- Puree -- Mechanical Soft -- Regular -- Multi-consistency -- Pill -- Cervical Esophageal Comment -- No flowsheet data found. [name] 04/28/2017, 11:05 AM  [name], M.A. CCC-SLP [phone]               Scheduled Meds: . acetaminophen (TYLENOL) oral liquid 160 mg/5 mL  650 mg Per Tube TID  . aspirin  325 mg Per Tube Daily  . chlorhexidine  15 mL Mouth Rinse BID  . clindamycin  300 mg Oral Q6H  . feeding supplement (PRO-STAT SUGAR FREE 64)  30 mL Per Tube BID  . glycopyrrolate  1 mg Oral BID  . heparin  subcutaneous  5,000 Units Subcutaneous Q8H  . insulin aspart  0-20 Units Subcutaneous Q4H  . insulin detemir  34 Units Subcutaneous Daily  . mouth rinse  15 mL Mouth Rinse q12n4p  . metoprolol succinate  12.5 mg Oral Daily  . pantoprazole sodium  40 mg Per Tube Daily   Continuous Infusions: . feeding  supplement (GLUCERNA 1.2 CAL) 1,000 mL (04/29/17 1022)     LOS: 28 days    Time spent: 30 min    Janece Canterbury, MD [facility] Pager [phone]  If 7PM-7AM, please contact night-coverage [email or web address] Password [facility] 04/29/2017, 3:35 PM

## 2017-04-30 ENCOUNTER — Inpatient Hospital Stay (HOSPITAL_COMMUNITY): Payer: Medicaid Other

## 2017-04-30 LAB — GLUCOSE, CAPILLARY
GLUCOSE-CAPILLARY: 135 mg/dL — AB (ref 65–99)
GLUCOSE-CAPILLARY: 147 mg/dL — AB (ref 65–99)
GLUCOSE-CAPILLARY: 158 mg/dL — AB (ref 65–99)
GLUCOSE-CAPILLARY: 163 mg/dL — AB (ref 65–99)
Glucose-Capillary: 157 mg/dL — ABNORMAL HIGH (ref 65–99)
Glucose-Capillary: 124 mg/dL — ABNORMAL HIGH (ref 65–99)
Glucose-Capillary: 132 mg/dL — ABNORMAL HIGH (ref 65–99)

## 2017-04-30 MED ORDER — METOPROLOL TARTRATE 12.5 MG HALF TABLET
12.5000 mg | ORAL_TABLET | Freq: Two times a day (BID) | ORAL | Status: DC
  Administered 2017-04-30 – 2017-05-29 (×57): 12.5 mg
  Filled 2017-04-30 (×63): qty 1

## 2017-04-30 MED ORDER — GLYCOPYRROLATE 1 MG PO TABS
1.0000 mg | ORAL_TABLET | Freq: Two times a day (BID) | ORAL | Status: DC
  Administered 2017-04-30 – 2017-05-29 (×57): 1 mg
  Filled 2017-04-30 (×63): qty 1

## 2017-04-30 MED ORDER — FUROSEMIDE 10 MG/ML IJ SOLN
40.0000 mg | Freq: Once | INTRAMUSCULAR | Status: AC
  Administered 2017-04-30: 40 mg via INTRAVENOUS
  Filled 2017-04-30: qty 4

## 2017-04-30 NOTE — Progress Notes (Signed)
PROGRESS NOTE  Joe Wilkinson  WUJ:[id number] DOB: 1960/11/02 DOA: 03/31/2017 PCP: Patient, No Pcp Per  Brief Narrative:   78 DM [place],  taken to [facility] found to be in DKA with sepsis from uncertain source [place] when found> intubation + hemiparesis-massive right MCA and PCA territory infarcts with mass effect>>transfer to  ICU care.  S/p decompressive right craniectomy 8/25.  Hypertonic saline was started per neurology recommendations and has been stopped He required transfusion support and was treated for AFib with RVR, continued on the ventilator, eventually receiving trach 9/5 and PEG 9/6, on ATC 9/7.   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  May have some acute diastolic heart failure related to his atrial fibrillation.  Doubt aspiration pneumonia given only left-sided and until today was on antibiotics that would have treated aspiration pna.   - Treated for S. agalactiae PNA, then restarted on zosyn 9/1 - 9/8 due to concern for aspiration.  - Continue trach - Plan to downsize to 4 cuffless trach today - CXR:  Small left pleural effusion, some cephalization   Septic shock due to S. agalactiae pneumonia: -Resolved, completed abx 8/30.    Peri-anal cellulitis with small abcess, resolved.  Completed clindamycin on 9.22. Gen surgery evaluated and determined no surgical drainage necessary  Massive right MCA territory infarct with PCA watershed infarct, subsequent cytotoxic edema requiring decompressive  craniectomy, hypertonic saline, now with tracheostomy and PEG - ASA per neurology - Now trach/PEG-dependent. Tube feeding at goal - Follow up with neurosurgery in 3 months to assess for cranioplasty - scheduled Tylenol and occasional ibuprofen for headache  AFib with RVR, CHad2Vasc2 score=4, likely caused by acute illness - has maintained NSR, no longer on telemetry - Continue ASA, not on full dose a/c due to recent anemia requiring 4 units of PRBC - Continue metprolol  Acute anemia, hemoglobin gradually improving - CT abdomen negative, no gross GI bleeding. EGD at time of PEG placement showed normal esophagus, stomach, duodenal bulb. Required transfusions x4.  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   DVT prophylaxis:  heparin  Code Status: Full Family Communication: None at bedside  Disposition Plan: awaiting placement at SNF  Consultants:   PCCM  Neurology  Neurosurgery  gen surg  SIGNIFICANT EVENTS: 8/23 Admit [facility] 8/23 8/24 Transfer to [facility] after Rt MCA CVA, start 3% NS 8/25 Decompressive craniectomy 8/27 Transfuse 1 unit PRBC 8/28 Transfuse 1 unit PRBC, A fib with RVR 8/30 Transfuse 1 unit PRBC 8/31 Off pressors, reintubated x 2, transfuse PRBC 9/5 Trach 9/6 PEG 9/15 abcess in rectal area, surgery consulted  Cultures/antimicrobials: CSF 8/24: negative BCX 8/24: negative UCX 8/24: negative Sputum 8/24: S. Agalactiae Sputum 9/1: Normal resp. flora  Vancomycin 8/24 - 8/26 Zosyn 8/24 - 8/26, 9/1 - 9/8 Ampicillin 8/27 - 8/30 vanc 9/15 9/18 Zosyn  9/16 9/18 Clindamycin 9/18   Antimicrobials:  Anti-infectives    Start     Dose/Rate Route Frequency Ordered Stop   04/26/17 0600  vancomycin (VANCOCIN) IVPB 750 mg/150 ml premix  Status:  Discontinued     750 mg 150 mL/hr over 60  Minutes Intravenous Every 12 hours 04/25/17 1729 04/25/17 1730   04/25/17 1830  clindamycin (CLEOCIN) capsule 300 mg     300 mg Oral Every 6 hours 04/25/17 1730 04/29/17 1717   04/25/17 1800  vancomycin (VANCOCIN) IVPB 750 mg/150 ml premix  Status:  Discontinued     750 mg 150 mL/hr over 60 Minutes Intravenous Every 12 hours 04/25/17 1728 04/25/17 1729   04/23/17 0945  piperacillin-tazobactam (ZOSYN) IVPB 3.375 g  Status:  Discontinued     3.375 g 12.5 mL/hr over 240 Minutes Intravenous Every 8 hours 04/23/17 0930 04/25/17 1730   04/23/17 0430  vancomycin (VANCOCIN) IVPB 1000 mg/200 mL premix  Status:  Discontinued     1,000 mg 200 mL/hr over 60 Minutes Intravenous Every 12 hours 04/22/17 1655 04/25/17 1728   04/22/17 1630  vancomycin (VANCOCIN) IVPB 1000 mg/200 mL premix     1,000 mg 200 mL/hr over 60 Minutes Intravenous  Once 04/22/17 1627 04/22/17 1826   04/08/17 0200  piperacillin-tazobactam (ZOSYN) IVPB 3.375 g  Status:  Discontinued     3.375 g 12.5 mL/hr over 240 Minutes Intravenous Every 8 hours 04/08/17 0058 04/15/17 1601   04/08/17 0100  piperacillin-tazobactam (ZOSYN) IVPB 3.375 g  Status:  Discontinued     3.375 g 100 mL/hr over 30 Minutes Intravenous Every 6 hours 04/08/17 0054 04/08/17 0057   04/03/17 1200  ampicillin (OMNIPEN) 2 g in sodium chloride 0.9 % 50 mL IVPB     2 g 150 mL/hr over 20 Minutes Intravenous Every 6 hours 04/03/17 1005 04/06/17 1837   04/01/17 1800  vancomycin (VANCOCIN) IVPB 1000 mg/200 mL premix  Status:  Discontinued     1,000 mg 200 mL/hr over 60 Minutes Intravenous Every 24 hours 04/01/17 0146 04/01/17 0154   04/01/17 1600  ceFAZolin (ANCEF) IVPB 2g/100 mL premix     2 g 200 mL/hr over 30 Minutes Intravenous Every 8 hours 04/01/17 1100 04/02/17 0056   04/01/17 0828  bacitracin 50,000 Units in sodium chloride irrigation 0.9 % 500 mL irrigation  Status:  Discontinued       As needed 04/01/17 0837 04/01/17 0947   04/01/17 0600   piperacillin-tazobactam (ZOSYN) IVPB 3.375 g  Status:  Discontinued     3.375 g 12.5 mL/hr over 240 Minutes Intravenous Every 8 hours 04/01/17 0146 04/01/17 0154   04/01/17 0600  vancomycin (VANCOCIN) IVPB 750 mg/150 ml premix  Status:  Discontinued     750 mg 150 mL/hr over 60 Minutes Intravenous Every 12 hours 04/01/17 0225 04/03/17 0959   04/01/17 0400  piperacillin-tazobactam (ZOSYN) IVPB 3.375 g  Status:  Discontinued     3.375 g 12.5 mL/hr over 240 Minutes Intravenous Every 8 hours 04/01/17 0225 04/03/17 0959       Subjective:  Worsening headache due to worsening cough with increased clear secretions.  Denies chest pains, nausea.    Objective: Vitals:   04/30/17 0846 04/30/17 0902 04/30/17 1203 04/30/17 1301  BP: 116/74   114/72  Pulse: 90 95 88 91  Resp: 20 (!) 22 18 16   Temp: 98.7 F (37.1 C)   98.5 F (36.9 C)  TempSrc: Oral   Axillary  SpO2: 97% 96% 100% 94%  Weight:  Height:        Intake/Output Summary (Last 24 hours) at 04/30/17 1348 Last data filed at 04/30/17 1120  Gross per 24 hour  Intake               60 ml  Output             1450 ml  Net            -1390 ml   Filed Weights   04/25/17 0412 04/26/17 0416 04/26/17 2055  Weight: 88.4 kg (194 lb 14.2 oz) 85.5 kg (188 lb 7.9 oz) 88.7 kg (195 lb 8.8 oz)    Examination:  General exam:  Adult Male, frequent cough with copious clear sputum that he coughs out of his trach and into his mouth. He had to be suctioned and his mouth twice during my visit.  No acute distress.  HEENT:  Bulging of the right parietal, temporal and occipital skull, MMM Respiratory system: Rhonchorous bilateral breath sounds, no obvious focal rales or wheeze Cardiovascular system: Regular rate and rhythm, normal S1/S2. No murmurs, rubs, gallops or clicks.  Warm extremities Gastrointestinal system: Normal active bowel sounds, soft, nondistended, nontender. PEG tube in place MSK:  Normal tone and bulk, no lower extremity  edema Neuro: Left hemiparesis  Data Reviewed: I have personally reviewed following labs and imaging studies  CBC:  Recent Labs Lab 04/24/17 0424 04/25/17 0337 04/26/17 0340  WBC 15.9* 16.0* 16.0*  NEUTROABS 9.9* 10.3* 10.7*  HGB 9.5* 9.5* 10.7*  HCT 31.8* 31.9* 35.3*  MCV 94.4 94.4 95.1  PLT 607* 594* 557*   Basic Metabolic Panel:  Recent Labs Lab 04/24/17 0424 04/25/17 0337 04/26/17 0340  NA 134* 134* 137  K 4.4 4.3 4.4  CL 101 99* 102  CO2 27 26 29   GLUCOSE 164* 174* 167*  BUN 27* 27* 29*  CREATININE 0.92 1.02 0.99  CALCIUM 10.1 10.3 10.9*   GFR: Estimated Creatinine Clearance: 93.5 mL/min (by C-G formula based on SCr of 0.99 mg/dL). Liver Function Tests:  Recent Labs Lab 04/24/17 0424 04/25/17 0337 04/26/17 0340  AST 23 20 20   ALT 23 24 24   ALKPHOS 67 66 73  BILITOT 0.4 0.3 0.2*  PROT 8.0 8.1 8.3*  ALBUMIN 2.3* 2.3* 2.5*   No results for input(s): LIPASE, AMYLASE in the last 168 hours. No results for input(s): AMMONIA in the last 168 hours. Coagulation Profile: No results for input(s): INR, PROTIME in the last 168 hours. Cardiac Enzymes: No results for input(s): CKTOTAL, CKMB, CKMBINDEX, TROPONINI in the last 168 hours. BNP (last 3 results) No results for input(s): PROBNP in the last 8760 hours. HbA1C: No results for input(s): HGBA1C in the last 72 hours. CBG:  Recent Labs Lab 04/29/17 2007 04/30/17 0037 04/30/17 0436 04/30/17 0823 04/30/17 1242  GLUCAP 125* 147* 135* 157* 163*   Lipid Profile: No results for input(s): CHOL, HDL, LDLCALC, TRIG, CHOLHDL, LDLDIRECT in the last 72 hours. Thyroid Function Tests: No results for input(s): TSH, T4TOTAL, FREET4, T3FREE, THYROIDAB in the last 72 hours. Anemia Panel: No results for input(s): VITAMINB12, FOLATE, FERRITIN, TIBC, IRON, RETICCTPCT in the last 72 hours. Urine analysis:    Component Value Date/Time   COLORURINE YELLOW (A) 03/31/2017 0945   APPEARANCEUR CLOUDY (A) 03/31/2017 0945    LABSPEC 1.018 03/31/2017 0945   PHURINE 5.0 03/31/2017 0945   GLUCOSEU 50 (A) 03/31/2017 0945   HGBUR MODERATE (A) 03/31/2017 0945   BILIRUBINUR NEGATIVE 03/31/2017 0945   KETONESUR NEGATIVE 03/31/2017  [place] (A) 03/31/2017 0945   NITRITE NEGATIVE 03/31/2017 0945   LEUKOCYTESUR NEGATIVE 03/31/2017 0945   Sepsis Labs: @LABRCNTIP (procalcitonin:4,lacticidven:4)  ) Recent Results (from the past 240 hour(s))  Culture, blood (routine x 2)     Status: None   Collection Time: 04/22/17  4:50 PM  Result Value Ref Range Status   Specimen Description BLOOD LEFT HAND  Final   Special Requests IN PEDIATRIC BOTTLE Blood Culture adequate volume  Final   Culture NO GROWTH 5 DAYS  Final   Report Status 04/27/2017 FINAL  Final  Culture, blood (routine x 2)     Status: None   Collection Time: 04/22/17  4:57 PM  Result Value Ref Range Status   Specimen Description BLOOD LEFT HAND  Final   Special Requests IN PEDIATRIC BOTTLE Blood Culture adequate volume  Final   Culture NO GROWTH 5 DAYS  Final   Report Status 04/27/2017 FINAL  Final      Radiology Studies: No results found.   Scheduled Meds: . acetaminophen (TYLENOL) oral liquid 160 mg/5 mL  650 mg Per Tube TID  . aspirin  325 mg Per Tube Daily  . chlorhexidine  15 mL Mouth Rinse BID  . feeding supplement (PRO-STAT SUGAR FREE 64)  30 mL Per Tube BID  . glycopyrrolate  1 mg Oral BID  . heparin subcutaneous  5,000 Units Subcutaneous Q8H  . insulin aspart  0-20 Units Subcutaneous Q4H  . insulin detemir  34 Units Subcutaneous Daily  . metoprolol tartrate  12.5 mg Per Tube BID  . pantoprazole sodium  40 mg Per Tube Daily   Continuous Infusions: . feeding supplement (GLUCERNA 1.2 CAL) 1,000 mL (04/30/17 0213)     LOS: 29 days    Time spent: 30 min    Janece Canterbury, MD [facility] Pager [phone]  If 7PM-7AM, please contact night-coverage [email or web address] Password [email or web address] 04/30/2017, 1:48 PM

## 2017-05-01 LAB — GLUCOSE, CAPILLARY
GLUCOSE-CAPILLARY: 116 mg/dL — AB (ref 65–99)
GLUCOSE-CAPILLARY: 139 mg/dL — AB (ref 65–99)
GLUCOSE-CAPILLARY: 144 mg/dL — AB (ref 65–99)
Glucose-Capillary: 146 mg/dL — ABNORMAL HIGH (ref 65–99)
Glucose-Capillary: 154 mg/dL — ABNORMAL HIGH (ref 65–99)
Glucose-Capillary: 146 mg/dL — ABNORMAL HIGH (ref 65–99)

## 2017-05-01 MED ORDER — FREE WATER
250.0000 mL | Freq: Three times a day (TID) | Status: DC
  Administered 2017-05-01 – 2017-05-29 (×75): 250 mL

## 2017-05-01 NOTE — Progress Notes (Signed)
Joe Wilkinson, Joe Wilkinson, No Pcp Per  Brief Narrative:   53 DM who was [place] un[place] bus stop,  taken to [facility] found to be in DKA with sepsis from uncertain source [place] when found> intubation + hemiparesis-massive right MCA and PCA territory infarcts with mass effect>>transfer to  ICU care.  S/p decompressive right craniectomy 8/25.  Hypertonic saline was started per neurology recommendations and has been stopped He required transfusion support and was treated for AFib with RVR, continued on the ventilator, eventually receiving trach 9/5 and PEG 9/6, on ATC 9/7.   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  Secretions improved with diuresis, likely had some acute on chronic diastolic dysfunction from a-fib - Treated for S. agalactiae PNA, then restarted on zosyn 9/1 - 9/8 due to concern for aspiration -  Check AM labs tomorrow -  No further lasix today  Septic shock due to S. agalactiae pneumonia: -Resolved, completed abx 8/30.    Peri-anal cellulitis with small abcess, resolved.  Completed clindamycin on 9.22. Gen surgery evaluated and determined no surgical drainage necessary  Massive right MCA territory infarct with PCA watershed infarct, subsequent cytotoxic edema requiring decompressive craniectomy, hypertonic saline, now with tracheostomy and PEG - ASA per neurology - Now trach/PEG-dependent. Tube feeding at goal - Follow up with neurosurgery in 2 months to assess for cranioplasty - scheduled Tylenol and  occasional ibuprofen for headache  AFib with RVR, CHad2Vasc2 score=4, likely caused by acute illness - has maintained NSR, no longer on telemetry - Continue ASA, not on full dose a/c due to recent anemia requiring 4 units of PRBC - Continue metprolol  Acute anemia, hemoglobin gradually improving - CT abdomen negative, no gross GI bleeding. EGD at time of PEG placement showed normal esophagus, stomach, duodenal bulb. Required transfusions x4.  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   DVT prophylaxis:  heparin  Code Status: Full Family Communication: None at bedside  Disposition Plan: awaiting placement at SNF  Consultants:   PCCM  Neurology  Neurosurgery  gen surg  SIGNIFICANT EVENTS: 8/23 Admit [facility] 8/23 8/24 Transfer to [facility] after Rt MCA CVA, start 3% NS 8/25 Decompressive craniectomy 8/27 Transfuse 1 unit PRBC 8/28 Transfuse 1 unit PRBC, A fib with RVR 8/30 Transfuse 1 unit PRBC 8/31 Off pressors, reintubated x 2, transfuse PRBC 9/5 Trach 9/6 PEG 9/15 abcess in rectal area, surgery consulted  Cultures/antimicrobials: CSF 8/24: negative BCX 8/24: negative UCX 8/24: negative Sputum 8/24: S. Agalactiae Sputum 9/1: Normal resp. flora  Vancomycin 8/24 - 8/26 Zosyn 8/24 - 8/26, 9/1 - 9/8 Ampicillin 8/27 - 8/30 vanc 9/15 9/18 Zosyn 9/16 9/18 Clindamycin 9/18   Antimicrobials:  Anti-infectives    Start     Dose/Rate Route Frequency Ordered Stop   04/26/17 0600  vancomycin (VANCOCIN) IVPB 750 mg/150 ml premix  Status:  Discontinued     750 mg 150 mL/hr over 60 Minutes Intravenous Every 12 hours 04/25/17 1729 04/25/17 1730   04/25/17 1830  clindamycin (CLEOCIN) capsule 300 mg     300 mg Oral Every 6 hours 04/25/17 1730 04/29/17 1717   04/25/17 1800  vancomycin (VANCOCIN) IVPB 750  mg/150 ml premix  Status:  Discontinued     750 mg 150 mL/hr over 60 Minutes Intravenous Every 12 hours 04/25/17 1728 04/25/17 1729   04/23/17 0945  piperacillin-tazobactam (ZOSYN) IVPB 3.375 g  Status:  Discontinued     3.375 g 12.5 mL/hr over 240 Minutes Intravenous Every 8 hours 04/23/17 0930 04/25/17 1730   04/23/17 0430  vancomycin (VANCOCIN) IVPB 1000 mg/200 mL premix  Status:  Discontinued     1,000 mg 200 mL/hr over 60 Minutes Intravenous Every 12 hours 04/22/17 1655 04/25/17 1728   04/22/17 1630  vancomycin (VANCOCIN) IVPB 1000 mg/200 mL premix     1,000 mg 200 mL/hr over 60 Minutes Intravenous  Once 04/22/17 1627 04/22/17 1826   04/08/17 0200  piperacillin-tazobactam (ZOSYN) IVPB 3.375 g  Status:  Discontinued     3.375 g 12.5 mL/hr over 240 Minutes Intravenous Every 8 hours 04/08/17 0058 04/15/17 1601   04/08/17 0100  piperacillin-tazobactam (ZOSYN) IVPB 3.375 g  Status:  Discontinued     3.375 g 100 mL/hr over 30 Minutes Intravenous Every 6 hours 04/08/17 0054 04/08/17 0057   04/03/17 1200  ampicillin (OMNIPEN) 2 g in sodium chloride 0.9 % 50 mL IVPB     2 g 150 mL/hr over 20 Minutes Intravenous Every 6 hours 04/03/17 1005 04/06/17 1837   04/01/17 1800  vancomycin (VANCOCIN) IVPB 1000 mg/200 mL premix  Status:  Discontinued     1,000 mg 200 mL/hr over 60 Minutes Intravenous Every 24 hours 04/01/17 0146 04/01/17 0154   04/01/17 1600  ceFAZolin (ANCEF) IVPB 2g/100 mL premix     2 g 200 mL/hr over 30 Minutes Intravenous Every 8 hours 04/01/17 1100 04/02/17 0056   04/01/17 0828  bacitracin 50,000 Units in sodium chloride irrigation 0.9 % 500 mL irrigation  Status:  Discontinued       As needed 04/01/17 0837 04/01/17 0947   04/01/17 0600  piperacillin-tazobactam (ZOSYN) IVPB 3.375 g  Status:  Discontinued     3.375 g 12.5 mL/hr over 240 Minutes Intravenous Every 8 hours 04/01/17 0146 04/01/17 0154   04/01/17 0600  vancomycin (VANCOCIN) IVPB 750 mg/150 ml premix  Status:   Discontinued     750 mg 150 mL/hr over 60 Minutes Intravenous Every 12 hours 04/01/17 0225 04/03/17 0959   04/01/17 0400  piperacillin-tazobactam (ZOSYN) IVPB 3.375 g  Status:  Discontinued     3.375 g 12.5 mL/hr over 240 Minutes Intravenous Every 8 hours 04/01/17 0225 04/03/17 0959       Subjective:  Ongoing headache.  Better some days than others.  Cough improved and therefore headache a little better today.  Denies chest pains, nausea.    Objective: Vitals:   05/01/17 0538 05/01/17 0928 05/01/17 0949 05/01/17 1300  BP: 117/75  125/79   Pulse: 100 95 (!) 101 92  Resp: 18 18 16 18   Temp: 98.4 F (36.9 C)  99.3 F (37.4 C)   TempSrc: Axillary  Axillary   SpO2: 98% 100% 97% 98%  Weight:      Height:        Intake/Output Summary (Last 24 hours) at 05/01/17 1348 Last data filed at 05/01/17 0025  Gross per 24 hour  Intake                0 ml  Output             1200 ml  Net            -1200 ml   Filed Weights   04/25/17 0412 04/26/17 0416 04/26/17 2055  Weight: 88.4 kg (194 lb 14.2 oz) 85.5 kg (188 lb 7.9 oz) 88.7 kg (195 lb 8.8 oz)    Examination:  General exam:  Adult male.  No acute distress.  HEENT:  Bulging of the right temporoparietal area where craniectomy was performed, MMM, tracheostomy in place Respiratory system: decreased rhonchi, no wheeze or rales Cardiovascular system: Regular rate and rhythm, normal S1/S2. No murmurs, rubs, gallops or clicks.  Warm extremities Gastrointestinal system: Normal active bowel sounds, soft, nondistended, nontender.  PEG tube MSK:  Normal tone and bulk, no lower extremity edema Neuro:  Left hemiparesis  Data Reviewed: I have personally reviewed following labs and imaging studies  CBC:  Recent Labs Lab 04/25/17 0337 04/26/17 0340  WBC 16.0* 16.0*  NEUTROABS 10.3* 10.7*  HGB 9.5* 10.7*  HCT 31.9* 35.3*  MCV 94.4 95.1  PLT 594* 350*   Basic Metabolic Panel:  Recent Labs Lab 04/25/17 0337 04/26/17 0340  NA 134*  137  K 4.3 4.4  CL 99* 102  CO2 26 29  GLUCOSE 174* 167*  BUN 27* 29*  CREATININE 1.02 0.99  CALCIUM 10.3 10.9*   GFR: Estimated Creatinine Clearance: 93.5 mL/min (by C-G formula based on SCr of 0.99 mg/dL). Liver Function Tests:  Recent Labs Lab 04/25/17 0337 04/26/17 0340  AST 20 20  ALT 24 24  ALKPHOS 66 73  BILITOT 0.3 0.2*  PROT 8.1 8.3*  ALBUMIN 2.3* 2.5*   No results for input(s): LIPASE, AMYLASE in the last 168 hours. No results for input(s): AMMONIA in the last 168 hours. Coagulation Profile: No results for input(s): INR, PROTIME in the last 168 hours. Cardiac Enzymes: No results for input(s): CKTOTAL, CKMB, CKMBINDEX, TROPONINI in the last 168 hours. BNP (last 3 results) No results for input(s): PROBNP in the last 8760 hours. HbA1C: No results for input(s): HGBA1C in the last 72 hours. CBG:  Recent Labs Lab 04/30/17 1944 04/30/17 2359 05/01/17 0409 05/01/17 0801 05/01/17 1151  GLUCAP 132* 158* 146* 154* 139*   Lipid Profile: No results for input(s): CHOL, HDL, LDLCALC, TRIG, CHOLHDL, LDLDIRECT in the last 72 hours. Thyroid Function Tests: No results for input(s): TSH, T4TOTAL, FREET4, T3FREE, THYROIDAB in the last 72 hours. Anemia Panel: No results for input(s): VITAMINB12, FOLATE, FERRITIN, TIBC, IRON, RETICCTPCT in the last 72 hours. Urine analysis:    Component Value Date/Time   COLORURINE YELLOW (A) 03/31/2017 0945   APPEARANCEUR CLOUDY (A) 03/31/2017 0945   LABSPEC 1.018 03/31/2017 0945   PHURINE 5.0 03/31/2017 0945   GLUCOSEU 50 (A) 03/31/2017 0945   HGBUR MODERATE (A) 03/31/2017 0945   BILIRUBINUR NEGATIVE 03/31/2017 0945   KETONESUR NEGATIVE 03/31/2017 0945   PROTEINUR 30 (A) 03/31/2017 0945   NITRITE NEGATIVE 03/31/2017 0945   LEUKOCYTESUR NEGATIVE 03/31/2017 0945   Sepsis Labs: @LABRCNTIP (procalcitonin:4,lacticidven:4)  ) Recent Results (from the past 240 hour(s))  Culture, blood (routine x 2)     Status: None   Collection  Time: 04/22/17  4:50 PM  Result Value Ref Range Status   Specimen Description BLOOD LEFT HAND  Final   Special Requests IN PEDIATRIC BOTTLE Blood  Culture adequate volume  Final   Culture NO GROWTH 5 DAYS  Final   Report Status 04/27/2017 FINAL  Final  Culture, blood (routine x 2)     Status: None   Collection Time: 04/22/17  4:57 PM  Result Value Ref Range Status   Specimen Description BLOOD LEFT HAND  Final   Special Requests IN PEDIATRIC BOTTLE Blood Culture adequate volume  Final   Culture NO GROWTH 5 DAYS  Final   Report Status 04/27/2017 FINAL  Final      Radiology Studies: Dg Chest Port 1 View  Result Date: 04/30/2017 CLINICAL DATA:  Respiratory failure EXAM: PORTABLE CHEST 1 VIEW COMPARISON:  04/24/2017 FINDINGS: Left lower lobe airspace disease has progressed in the interval. Small left effusion. Right lung clear. Tracheostomy remains in good position IMPRESSION: Progression of mild left lower lobe atelectasis/ infiltrate and small left effusion. Electronically Signed   By: Franchot Gallo M.D.   On: 04/30/2017 15:20     Scheduled Meds: . acetaminophen (TYLENOL) oral liquid 160 mg/5 mL  650 mg Per Tube TID  . aspirin  325 mg Per Tube Daily  . chlorhexidine  15 mL Mouth Rinse BID  . feeding supplement (PRO-STAT SUGAR FREE 64)  30 mL Per Tube BID  . glycopyrrolate  1 mg Per Tube BID  . heparin subcutaneous  5,000 Units Subcutaneous Q8H  . insulin aspart  0-20 Units Subcutaneous Q4H  . insulin detemir  34 Units Subcutaneous Daily  . metoprolol tartrate  12.5 mg Per Tube BID  . pantoprazole sodium  40 mg Per Tube Daily   Continuous Infusions: . feeding supplement (GLUCERNA 1.2 CAL) 1,000 mL (04/30/17 0213)     LOS: 30 days    Time spent: 30 min    Janece Canterbury, MD [facility] Pager [phone]  If 7PM-7AM, please contact night-coverage [email or web address] Password [email or web address] 05/01/2017, 1:48 PM

## 2017-05-01 NOTE — Progress Notes (Signed)
Physical Therapy Treatment Patient Details Name: Joe Wilkinson MRN: [id number] DOB: 05-Mar-1961 Today's Date: 05/01/2017    History of Present Illness 56 yo male brought to [facility] with altered mental status from DKA and sepsis.  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  Required Rt decompressive craniectomy and remained on vent post op.ETT 8/23-9/5, s/p tracheostomy 9/5    PT Comments    Patient seen for activity progression, tolerated EOB activity ~15 minutes as well as extensive bed mobility with log rolls to the left. Patient continues to require increased assist for all aspects of mobility. Will continue with current POC.   Follow Up Recommendations  SNF;Supervision/Assistance - 24 hour     Equipment Recommendations  Other (comment) (TBD)    Recommendations for Other Services       Precautions / Restrictions Precautions Precautions: Fall;Other (comment) Precaution Comments: craniectomy, no bone flap right side; trach in place Restrictions Weight Bearing Restrictions: No    Mobility  Bed Mobility Overal bed mobility: Needs Assistance Bed Mobility: Rolling Rolling: Max assist;Total assist   Supine to sit: Max assist;+2 for physical assistance;HOB elevated Sit to supine: +2 for physical assistance;Total assist   General bed mobility comments: rolled towards right side, patient able to initiate right RUE and LE movement to EOB, manual hand placement on rail right side to pull to sit, +2 max to elevate trunk and rotate hips to EOB (bed mobility was partical focus of session)  Transfers                 General transfer comment: unable   Ambulation/Gait         Gait velocity: unable   General Gait Details: unable   Stairs            Wheelchair Mobility    Modified Rankin (Stroke Patients Only) Modified Rankin (Stroke Patients Only) Pre-Morbid Rankin Score: No symptoms Modified Rankin: Severe disability     Balance Overall balance  assessment: Needs assistance Sitting-balance support: Feet supported;Single extremity supported Sitting balance-Leahy Scale: Poor Sitting balance - Comments: Moderate to max assist at EOB ~15 minutes. Patient able to initate some trunk activitation for positioning, demonstrates use of RUE to help maintain stability at times in conjunction with therapist assist Postural control: Posterior lean                                  Cognition Arousal/Alertness: Awake/alert Behavior During Therapy: Flat affect Overall Cognitive Status: Difficult to assess Area of Impairment: Problem solving;Awareness;Safety/judgement;Following commands;Attention;Memory                   Current Attention Level: Sustained Memory: Decreased recall of precautions;Decreased short-term memory (asking for a drink) Following Commands: Follows one step commands with increased time;Follows one step commands inconsistently Safety/Judgement: Decreased awareness of safety;Decreased awareness of deficits   Problem Solving: Difficulty sequencing;Slow processing;Requires verbal cues;Requires tactile cues General Comments: L inattention, repetition and multimodal cues required for 2 step commands      Exercises General Exercises - Lower Extremity Ankle Circles/Pumps: AROM;Right;Supine;10 reps Heel Slides: AROM;Right;Supine;10 reps Other Exercises Other Exercises: Sitting balance and trunk control activities at EOB.. Reaching with RUE Other Exercises: Visual field activities performed in sitting in conjunction with reaching via cup stacking  Other Exercises: Log rolling with initiation via RLE and RUE to the left x5 with static hold in sidelying 30 seconds    General Comments  Pertinent Vitals/Pain Pain Assessment: Faces Faces Pain Scale: Hurts little more Pain Location: neck/head Pain Descriptors / Indicators: Headache Pain Intervention(s): Monitored during session;Repositioned;Patient  requesting pain meds-RN notified    Home Living                      Prior Function            PT Goals (current goals can now be found in the care plan section) Acute Rehab PT Goals Patient Stated Goal: none stated PT Goal Formulation: Patient unable to participate in goal setting Time For Goal Achievement: 05/04/17 Potential to Achieve Goals: Fair Progress towards PT goals: Progressing toward goals    Frequency    Min 3X/week      PT Plan Current plan remains appropriate    Co-evaluation PT/OT/SLP Co-Evaluation/Treatment: Yes Reason for Co-Treatment: Complexity of the patient's impairments (multi-system involvement);For patient/therapist safety PT goals addressed during session: Mobility/safety with mobility OT goals addressed during session: Strengthening/ROM      AM-PAC PT "6 Clicks" Daily Activity  Outcome Measure  Difficulty turning over in bed (including adjusting bedclothes, sheets and blankets)?: Unable Difficulty moving from lying on back to sitting on the side of the bed? : Unable Difficulty sitting down on and standing up from a chair with arms (e.g., wheelchair, bedside commode, etc,.)?: Unable Help needed moving to and from a bed to chair (including a wheelchair)?: Total Help needed walking in hospital room?: Total Help needed climbing 3-5 steps with a railing? : Total 6 Click Score: 6    End of Session Equipment Utilized During Treatment: Oxygen (28% FiO2) Activity Tolerance: Patient tolerated treatment well Patient left: in bed;with call bell/phone within reach;with bed alarm set Nurse Communication: Mobility status;Need for lift equipment PT Visit Diagnosis: Other symptoms and signs involving the nervous system (R29.898);Hemiplegia and hemiparesis Hemiplegia - Right/Left: Left Hemiplegia - dominant/non-dominant: Non-dominant Hemiplegia - caused by: Nontraumatic intracerebral hemorrhage     Time: 7989-2119 PT Time Calculation (min)  (ACUTE ONLY): 26 min  Charges:  $Therapeutic Activity: 8-22 mins                    G Codes:       Joe Wilkinson, PT DPT  Board Certified Neurologic Specialist [phone]    Joe Wilkinson 05/01/2017, 11:29 AM

## 2017-05-01 NOTE — Progress Notes (Signed)
Nutrition Follow-up  DOCUMENTATION CODES:   Not applicable  INTERVENTION:   -Continue Glucerna 1.2 @ 65 ml/hr with Pro-Stat 30 mL BID  -Current TF providing 1264 mL of free water. Recommend addition of additional free water flushes of 250 mL q 8 hours providing total of 2014 mL of free water to meet hydration needs  NUTRITION DIAGNOSIS:   Inadequate oral intake related to inability to eat as evidenced by NPO status.  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    Awaiting SNF placement Remains on trach collar  9/15 Peri-anal cellulitis with small abscess with antibiotic completed on 9/22, no surgical drainage needed  Tolerating Glucerna 1.2 @ rate of 65 ml/hr with Pro-Stat BID via G-tube No signs of GI intolerance UOP 2.25 L in previous 24 hours.   Labs: CBGs 124-163, no recent BMP Meds: ss novolog, levemir  Diet Order:  Diet NPO time specified Except for: Ice Chips  Skin:  Wound (see comment) (perianal cellulitis, no pressure ulcers)  Last BM:  9/23  Height:   Ht Readings from Last 1 Encounters:  04/26/17 _0  (1.778 m)    Weight:   Wt Readings from Last 1 Encounters:  04/26/17 195 lb 8.8 oz (88.7 kg)    Ideal Body Weight:  75.45 kg  BMI:  Body mass index is 28.06 kg/m.  Estimated Nutritional Needs:   Kcal:  2100-2300  Protein:  115-130 gm  Fluid:  2.1-2.3 L  EDUCATION NEEDS:   No education needs identified at this time  [place], [place], LDN [phone] Pager  [phone] Weekend/On-Call Pager

## 2017-05-01 NOTE — Progress Notes (Signed)
  Speech Language Pathology Treatment: Dysphagia;Cognitive-Linquistic;Passy Muir Speaking valve  Patient Details Name: Eduar Kumpf MRN: [id number] DOB: 1961/01/24 Today's Date: 05/01/2017 Time: 0201-0229 SLP Time Calculation (min) (ACUTE ONLY): 28 min  Assessment / Plan / Recommendation Clinical Impression  Pt observed with thin liquid via ice chip/teaspoon/ice cream presentations; timely swallow noted with no coughing or throat clear following bolus transit. Pt demonstrated ability to orally control bolus with no anterior spillage or oral holding suggesting improved oral phase function from previous MBS report. Recommend continuation of NPO status allowing ice chips following oral care until consistent tolerance of upgraded textures are observed.     PMSV placed for 20 minutes; pt tolerated with no changes in vital signs or signs of air trapping. Pt initiated voice but was primarily unintelligible due to poor respiratory support. SLP provided moderate verbal and functional cues to elicit pt requests for wants and needs and encouraged pt to attend to functional task (brushing teeth) which he did independently to completion. Continue to follow for functional communication, PSMV tx and upgraded PO trials.    HPI HPI: 56 yo male brought to [facility] with altered mental status from DKA and sepsis. 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 Required Rt decompressive craniectomy and remained on vent post op.ETT 8/23-9/5, s/p tracheostomy 9/5.       SLP Plan  Continue with current plan of care       Recommendations  Diet recommendations: NPO (Ice chips allowed following oral care) Medication Administration: Via alternative means      Patient may use Passy-Muir Speech Valve: Intermittently with supervision;During all therapies with supervision;During PO intake/meals PMSV Supervision: Full MD: Please consider changing trach tube to : Smaller size         Oral Care Recommendations: Oral care  QID Follow up Recommendations: Skilled Nursing facility SLP Visit Diagnosis: Dysphagia, oropharyngeal phase (R13.12);Cognitive communication deficit (R41.841) Plan: Continue with current plan of care       [place], Student SLP 05/01/2017, 3:10 PM

## 2017-05-01 NOTE — Progress Notes (Signed)
PULMONARY / CRITICAL CARE MEDICINE   Name: Joe Wilkinson MRN: [id number] DOB: 06-20-1961    ADMISSION DATE:  03/31/2017 CHIEF COMPLAINT:  CVA  HISTORY OF PRESENT ILLNESS:   56 yo male brought to [facility] with altered mental status from DKA and sepsis.  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  Required Rt decompressive craniectomy and remained on vent post op.  SUBJECTIVE:  Trach downsized to #4 cuffless on 9/22.  No acute events.  Remains on ATC at 28%. Still with mod secretions.  VITAL SIGNS: BP 117/75 (BP Location: Left Arm)   Pulse 100   Temp 98.4 F (36.9 C) (Axillary)   Resp 18   Ht 5\' 10"  (1.778 m)   Wt 88.7 kg (195 lb 8.8 oz)   SpO2 98%   BMI 28.06 kg/m   VENTILATOR SETTINGS: FiO2 (%):  [28 %] 28 %  INTAKE / OUTPUT: I/O last 3 completed shifts: In: -  Out: 2250 [Urine:2250]  PHYSICAL EXAMINATION:  Gen:      Chronically ill appearing, NAD on ATC supine in bed  HEENT:  Head bulges r side where flap missing, atraumatic, PERRL, no JVD, trach clean ,midline and secure, WD and Intact. Neck:      #4 cuffless trach c/d/i , mid line, secure Lungs:   Resps even, unlabored. CTAB CV:         RRR,  S1/S2, -M/R/G. Abd:     Soft, non-tender, non-distended, BS+ Ext:   Trace  edema, otherwise unremarkable Skin:       Warm,dry,  intact, without rash or lesions Neuro:   Awake and alert, moving right side, Following simple commands, nods appropriately   BMET  Recent Labs Lab 04/25/17 0337 04/26/17 0340  NA 134* 137  K 4.3 4.4  CL 99* 102  CO2 26 29  BUN 27* 29*  CREATININE 1.02 0.99  GLUCOSE 174* 167*    Electrolytes  Recent Labs Lab 04/25/17 0337 04/26/17 0340  CALCIUM 10.3 10.9*    CBC  Recent Labs Lab 04/25/17 0337 04/26/17 0340  WBC 16.0* 16.0*  HGB 9.5* 10.7*  HCT 31.9* 35.3*  PLT 594* 560*    Coag's No results for input(s): APTT, INR in the last 168 hours.  Sepsis Markers No results for input(s): LATICACIDVEN, PROCALCITON, O2SATVEN in the  last 168 hours.  ABG No results for input(s): PHART, PCO2ART, PO2ART in the last 168 hours.  Liver Enzymes  Recent Labs Lab 04/25/17 0337 04/26/17 0340  AST 20 20  ALT 24 24  ALKPHOS 66 73  BILITOT 0.3 0.2*  ALBUMIN 2.3* 2.5*    Cardiac Enzymes No results for input(s): TROPONINI, PROBNP in the last 168 hours.  Glucose  Recent Labs Lab 04/30/17 1242 04/30/17 1745 04/30/17 1944 04/30/17 2359 05/01/17 0409 05/01/17 0801  GLUCAP 163* 124* 132* 158* 146* 154*    Imaging Dg Chest Port 1 View  Result Date: 04/30/2017 CLINICAL DATA:  Respiratory failure EXAM: PORTABLE CHEST 1 VIEW COMPARISON:  04/24/2017 FINDINGS: Left lower lobe airspace disease has progressed in the interval. Small left effusion. Right lung clear. Tracheostomy remains in good position IMPRESSION: Progression of mild left lower lobe atelectasis/ infiltrate and small left effusion. Electronically Signed   By: Franchot Gallo M.D.   On: 04/30/2017 15:20    STUDIES:  LP 8/24 >> glucose 205, RBC 16, protein 62, WBC 5 CT head 8/24 >> Large Rt MCA EEG 8/27 >> bilateral cerebral dysfunction, non-specific in etiology Echo 8/27 >> EF  65 to 70% CT abd/pelvis 8/30 >> third spacing, 3.5 cm low attenuation Lt hepatic lobe CT Head 9/3>> Severe right hemisphere cytotoxic edema with stable extension of edematous brain through the right craniectomy defect. Stable petechial hemorrhage without malignant hemorrhagicTransformation. Stable intracranial mass effect with leftward midline shift of 7 mm. Small left PCA territory infarct. CXR 9/23 > LLL atx and small effusion Barrium swallow 9/21 > severe oral and mod pharyngeal dysphagia; mod to severe aspiration risk  CULTURES: CSF 8/24>> negative BCX 8/24>> negative UCX 8/24>> negative Sputum 8/24>> S. Agalactiae Sputum 9/1>> Consistent with normal resp. flora  ANTIBIOTICS: vanco 8/24 >> 8/26 Zosyn 8/24 >> 8/26, 9/1 >>off  Cleocin 9/18>>> Ampicillin 8/27 >  8/30  SIGNIFICANT EVENTS: 8/23 Admit [facility] 8/23 8/24 Transfer to [facility] after Rt MCA CVA, start 3% NS 8/25 Decompressive craniectomy 8/27 Transfuse 1 unit PRBC 8/28 Transfuse 1 unit PRBC, A fib with RVR 8/30 Transfuse 1 unit PRBC 8/31 Off pressors, reintubated x 2, transfuse PRBC 9/5 Trach 9/6 PEG 9/14 Trach exchanged from 8 cuffed to 6 cuffless 9/22 Trach exchanged to 4 cuffless  LINES/TUBES: ETT 8/23 >>9/5 Trach [name]) 9/5>>> Lt IJ CVL 8/23 >> PEG 9/6 >  ASSESSMENT / PLAN:  Acute on chronic hypoxemic respiratory failure s/p trach  Small pleural effusion- improved Plan: - Continue ATC as tolerated - Titrate oxygen to Maintain saturations 88-92% - Aggressive pulmonary hygiene  - Mobilize as able - PMV per speech  - Intermittent CXR to follow effusion - Not ready for decannulation 2/2 secretions  Dysphagia - barrium swallow 9/21 with severe oral and mod pharyngeal dysphagia; mod to severe aspiration risk Plan: -Continue TF with NPO status -SLP following -PMV as able   PCCM will follow for Trach.   Montey Hora, [place] [facility] Pager: [phone]  or [phone] 05/01/2017, 9:15 AM

## 2017-05-01 NOTE — Progress Notes (Signed)
Occupational Therapy Treatment Patient Details Name: Joe Wilkinson MRN: [id number] DOB: March 05, 1961 Today's Date: 05/01/2017    History of present illness 56 yo male brought to [facility] with altered mental status from DKA and sepsis.  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  Required Rt decompressive craniectomy and remained on vent post op.ETT 8/23-9/5, s/p tracheostomy 9/5   OT comments  Pt with c/o neck/head pain, but willing to work with therapies. Focus of session on trunk strengthening through bed mobility and sitting EOB, locating visual targets on L side and protecting/positioning L UE in bed. Pt requiring mod to max assist for sitting balance, but tolerated x 15 minutes. VSS throughout session.  Follow Up Recommendations  SNF;Supervision/Assistance - 24 hour    Equipment Recommendations       Recommendations for Other Services      Precautions / Restrictions Precautions Precautions: Fall;Other (comment) Precaution Comments: craniectomy, no bone flap right side; trach in place Restrictions Weight Bearing Restrictions: No       Mobility Bed Mobility Overal bed mobility: Needs Assistance Bed Mobility: Rolling Rolling: Max assist;Total assist   Supine to sit: Max assist;+2 for physical assistance;HOB elevated Sit to supine: +2 for physical assistance;Total assist   General bed mobility comments: rolled towards right side, patient able to initiate right RUE and LE movement to EOB, manual hand placement on rail right side to pull to sit, +2 max to elevate trunk and rotate hips to EOB  Transfers                 General transfer comment: unable     Balance Overall balance assessment: Needs assistance Sitting-balance support: Feet supported;Single extremity supported Sitting balance-Leahy Scale: Poor Sitting balance - Comments: Moderate to max assist at EOB ~15 minutes. Patient able to initate some trunk activitation for positioning, demonstrates use of RUE to  help maintain stability at times in conjunction with therapist assist Postural control: Posterior lean;Left lateral lean                                 ADL either performed or assessed with clinical judgement   ADL                                         General ADL Comments: Pt requiring total assist to locate L UE with his R to protect during bed mobility and positioning     Vision   Additional Comments: Pt with L visual field cut, neglect.   Perception     Praxis      Cognition Arousal/Alertness: Awake/alert Behavior During Therapy: Flat affect Overall Cognitive Status: Difficult to assess Area of Impairment: Problem solving;Awareness;Safety/judgement;Following commands;Attention;Memory                   Current Attention Level: Sustained Memory: Decreased recall of precautions;Decreased short-term memory (asking for a drink) Following Commands: Follows one step commands with increased time;Follows one step commands inconsistently Safety/Judgement: Decreased awareness of safety;Decreased awareness of deficits   Problem Solving: Difficulty sequencing;Slow processing;Requires verbal cues;Requires tactile cues General Comments: L inattention, repetition and multimodal cues required for 2 step commands        Exercises General Exercises - Lower Extremity Ankle Circles/Pumps: AROM;Right;Supine;10 reps Heel Slides: AROM;Right;Supine;10 reps Other Exercises Other Exercises: Sitting balance and trunk control activities  at EOB.. Reaching with RUE Other Exercises: Visual field activities performed in sitting in conjunction with reaching via cup stacking  Other Exercises: Log rolling with initiation via RLE and RUE to the left x5 with static hold in sidelying 30 seconds   Shoulder Instructions       General Comments      Pertinent Vitals/ Pain       Pain Assessment: Faces Faces Pain Scale: Hurts little more Pain Location:  neck/head Pain Descriptors / Indicators: Headache Pain Intervention(s): Repositioned;Monitored during session;Patient requesting pain meds-RN notified  Home Living                                          Prior Functioning/Environment              Frequency  Min 2X/week        Progress Toward Goals  OT Goals(current goals can now be found in the care plan section)  Progress towards OT goals: Progressing toward goals  Acute Rehab OT Goals Patient Stated Goal: none stated OT Goal Formulation: Patient unable to participate in goal setting  Plan Discharge plan remains appropriate    Co-evaluation    PT/OT/SLP Co-Evaluation/Treatment: Yes Reason for Co-Treatment: Complexity of the patient's impairments (multi-system involvement);For patient/therapist safety PT goals addressed during session: Mobility/safety with mobility OT goals addressed during session: Strengthening/ROM      AM-PAC PT "6 Clicks" Daily Activity     Outcome Measure   Help from another person eating meals?: Total Help from another person taking care of personal grooming?: Total Help from another person toileting, which includes using toliet, bedpan, or urinal?: Total Help from another person bathing (including washing, rinsing, drying)?: Total Help from another person to put on and taking off regular upper body clothing?: Total Help from another person to put on and taking off regular lower body clothing?: Total 6 Click Score: 6    End of Session Equipment Utilized During Treatment: Oxygen  OT Visit Diagnosis: Unsteadiness on feet (R26.81);Muscle weakness (generalized) (M62.81);Low vision, both eyes (H54.2);Other symptoms and signs involving the nervous system (R29.898);Other symptoms and signs involving cognitive function;Hemiplegia and hemiparesis;Pain Hemiplegia - Right/Left: Left Hemiplegia - dominant/non-dominant: Non-Dominant Hemiplegia - caused by: Cerebral infarction Pain  - Right/Left: Right   Activity Tolerance Patient tolerated treatment well   Patient Left in bed;with call bell/phone within reach;with bed alarm set;with SCD's reapplied   Nurse Communication Mobility status;Patient requests pain meds        Time: 5732-2025 OT Time Calculation (min): 30 min  Charges: OT General Charges $OT Visit: 1 Visit OT Treatments $Neuromuscular Re-education: 8-22 mins  05/01/2017 Nestor Lewandowsky, OTR/L Pager: [phone] Werner Lean, [place]aze [place] 05/01/2017, 12:20 PM

## 2017-05-02 ENCOUNTER — Inpatient Hospital Stay (HOSPITAL_COMMUNITY): Payer: Medicaid Other

## 2017-05-02 DIAGNOSIS — R1084 Generalized abdominal pain: Secondary | ICD-10-CM

## 2017-05-02 LAB — HEPATIC FUNCTION PANEL
ALBUMIN: 2.5 g/dL — AB (ref 3.5–5.0)
ALK PHOS: 60 U/L (ref 38–126)
ALT: 20 U/L (ref 17–63)
AST: 19 U/L (ref 15–41)
BILIRUBIN TOTAL: 0.2 mg/dL — AB (ref 0.3–1.2)
Total Protein: 7.2 g/dL (ref 6.5–8.1)

## 2017-05-02 LAB — CBC
HCT: 36.6 % — ABNORMAL LOW (ref 39.0–52.0)
Hemoglobin: 10.6 g/dL — ABNORMAL LOW (ref 13.0–17.0)
MCH: 27.7 pg (ref 26.0–34.0)
MCHC: 29 g/dL — ABNORMAL LOW (ref 30.0–36.0)
MCV: 95.8 fL (ref 78.0–100.0)
PLATELETS: 308 10*3/uL (ref 150–400)
RBC: 3.82 MIL/uL — AB (ref 4.22–5.81)
RDW: 17 % — AB (ref 11.5–15.5)
WBC: 13.4 10*3/uL — AB (ref 4.0–10.5)

## 2017-05-02 LAB — URINALYSIS, ROUTINE W REFLEX MICROSCOPIC
BILIRUBIN URINE: NEGATIVE
Glucose, UA: NEGATIVE mg/dL
Hgb urine dipstick: NEGATIVE
KETONES UR: NEGATIVE mg/dL
Leukocytes, UA: NEGATIVE
Nitrite: NEGATIVE
Protein, ur: 100 mg/dL — AB
SPECIFIC GRAVITY, URINE: 1.017 (ref 1.005–1.030)
SQUAMOUS EPITHELIAL / LPF: NONE SEEN
pH: 7 (ref 5.0–8.0)

## 2017-05-02 LAB — GLUCOSE, CAPILLARY
GLUCOSE-CAPILLARY: 126 mg/dL — AB (ref 65–99)
GLUCOSE-CAPILLARY: 119 mg/dL — AB (ref 65–99)
GLUCOSE-CAPILLARY: 122 mg/dL — AB (ref 65–99)
Glucose-Capillary: 152 mg/dL — ABNORMAL HIGH (ref 65–99)
Glucose-Capillary: 156 mg/dL — ABNORMAL HIGH (ref 65–99)

## 2017-05-02 LAB — BASIC METABOLIC PANEL
Anion gap: 7 (ref 5–15)
BUN: 31 mg/dL — AB (ref 6–20)
CALCIUM: 10.3 mg/dL (ref 8.9–10.3)
CO2: 30 mmol/L (ref 22–32)
Chloride: 101 mmol/L (ref 101–111)
Creatinine, Ser: 0.77 mg/dL (ref 0.61–1.24)
GFR calc Af Amer: >60 mL/min (ref >60)
GLUCOSE: 142 mg/dL — AB (ref 65–99)
POTASSIUM: 4.3 mmol/L (ref 3.5–5.1)
Sodium: 138 mmol/L (ref 135–145)

## 2017-05-02 LAB — LIPASE, BLOOD: LIPASE: 33 U/L (ref 11–51)

## 2017-05-02 MED ORDER — GABAPENTIN 300 MG PO CAPS
300.0000 mg | ORAL_CAPSULE | Freq: Every day | ORAL | Status: DC
  Administered 2017-05-02 – 2017-05-28 (×27): 300 mg via ORAL
  Filled 2017-05-02 (×27): qty 1

## 2017-05-02 MED ORDER — ENOXAPARIN SODIUM 40 MG/0.4ML ~~LOC~~ SOLN
40.0000 mg | SUBCUTANEOUS | Status: DC
  Administered 2017-05-02 – 2017-05-08 (×7): 40 mg via SUBCUTANEOUS
  Filled 2017-05-02 (×7): qty 0.4

## 2017-05-02 NOTE — Progress Notes (Signed)
PROGRESS NOTE  Joe Wilkinson  XBJ:[id number] DOB: 09-01-1960 DOA: 03/31/2017 PCP: Patient, No Pcp Per  Brief Narrative:   75 DM who was [place] bus [place], taken to [facility] found to be in DKA with sepsis from uncertain source.  He was [place] when found and required intubation.  He had hemiparesis secondary to massive right MCA and PCA territory infarcts with mass effect.  He was transferred to the ICU and underwent decompressive right craniectomy 8/25.  Hypertonic saline protocol was completed.  He required transfusion support and was treated for AFib with RVR, continued on the ventilator, eventually receiving trach 9/5 and PEG 9/6, on ATC 9/7.  Patient does not have health insurance and because of his craniectomy without the ability to fit a helmet, most skilled nursing facilities are unwilling to accept him.  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  Secretions improved with diuresis, likely had some acute on chronic diastolic dysfunction from a-fib - Treated for S. agalactiae PNA -  Subsequently treated with zosyn 9/1 - 9/8 for aspiration  Septic shock due to S. agalactiae pneumonia: -Resolved, completed abx 8/30.    Peri-anal cellulitis with small abcess, resolved.  Completed clindamycin on 9.22. 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 Tube feeding at goal - Follow up with neurosurgery in 2 months to assess for cranioplasty - scheduled Tylenol and occasional ibuprofen for headache  AFib with RVR, CHad2Vasc2 score=4, likely caused by acute illness - has maintained NSR, no longer on telemetry - Continue ASA, initially not on full dose a/c due to anemia requiring 4 units of PRBC, but since his hemoglobin has been stable and improving for a while, consider starting full code a/c if okay with neurosurgery/neurology - Continue metprolol  Acute blood loss anemia, hemoglobin gradually improving - CT abdomen negative, no gross GI bleeding. EGD at time of PEG placement showed normal esophagus, stomach, duodenal bulb. Required transfusions x4.  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   Mild diffuse abdominal pains, new.  May be gas pains -  LFTs, lipase: Within normal limits -  Urinalysis and urine culture:  Negative for nitrites, leukocyte esterase, 0-5 WBC (negative) -  KUB: Normal  DVT prophylaxis:  lovenox Code Status: Full Family Communication: None at bedside  Disposition Plan: awaiting placement at SNF  Consultants:   [facility]  Neurology  Neurosurgery  gen surg  SIGNIFICANT EVENTS: 8/23 Admit [facility] 8/23 8/24 Transfer to [facility] after Rt MCA CVA, start 3% NS 8/25 Decompressive craniectomy 8/27 Transfuse 1 unit PRBC 8/28 Transfuse 1 unit PRBC, A fib with RVR 8/30 Transfuse 1  unit PRBC 8/31 Off pressors, reintubated x 2, transfuse PRBC 9/5 Trach 9/6 PEG 9/15 abcess in rectal area, surgery consulted  Cultures/antimicrobials: CSF 8/24: negative BCX 8/24: negative UCX 8/24: negative Sputum 8/24: S.  Agalactiae Sputum 9/1: Normal resp. flora  Vancomycin 8/24 - 8/26 Zosyn 8/24 - 8/26, 9/1 - 9/8 Ampicillin 8/27 - 8/30 vanc 9/15 9/18 Zosyn 9/16 9/18 Clindamycin 9/18   Antimicrobials:  Anti-infectives    Start     Dose/Rate Route Frequency Ordered Stop   04/26/17 0600  vancomycin (VANCOCIN) IVPB 750 mg/150 ml premix  Status:  Discontinued     750 mg 150 mL/hr over 60 Minutes Intravenous Every 12 hours 04/25/17 1729 04/25/17 1730   04/25/17 1830  clindamycin (CLEOCIN) capsule 300 mg     300 mg Oral Every 6 hours 04/25/17 1730 04/29/17 1717   04/25/17 1800  vancomycin (VANCOCIN) IVPB 750 mg/150 ml premix  Status:  Discontinued     750 mg 150 mL/hr over 60 Minutes Intravenous Every 12 hours 04/25/17 1728 04/25/17 1729   04/23/17 0945  piperacillin-tazobactam (ZOSYN) IVPB 3.375 g  Status:  Discontinued     3.375 g 12.5 mL/hr over 240 Minutes Intravenous Every 8 hours 04/23/17 0930 04/25/17 1730   04/23/17 0430  vancomycin (VANCOCIN) IVPB 1000 mg/200 mL premix  Status:  Discontinued     1,000 mg 200 mL/hr over 60 Minutes Intravenous Every 12 hours 04/22/17 1655 04/25/17 1728   04/22/17 1630  vancomycin (VANCOCIN) IVPB 1000 mg/200 mL premix     1,000 mg 200 mL/hr over 60 Minutes Intravenous  Once 04/22/17 1627 04/22/17 1826   04/08/17 0200  piperacillin-tazobactam (ZOSYN) IVPB 3.375 g  Status:  Discontinued     3.375 g 12.5 mL/hr over 240 Minutes Intravenous Every 8 hours 04/08/17 0058 04/15/17 1601   04/08/17 0100  piperacillin-tazobactam (ZOSYN) IVPB 3.375 g  Status:  Discontinued     3.375 g 100 mL/hr over 30 Minutes Intravenous Every 6 hours 04/08/17 0054 04/08/17 0057   04/03/17 1200  ampicillin (OMNIPEN) 2 g in sodium chloride 0.9 % 50 mL IVPB     2 g 150 mL/hr over 20 Minutes Intravenous Every 6 hours 04/03/17 1005 04/06/17 1837   04/01/17 1800  vancomycin (VANCOCIN) IVPB 1000 mg/200 mL premix  Status:  Discontinued     1,000 mg 200 mL/hr over 60 Minutes Intravenous Every  24 hours 04/01/17 0146 04/01/17 0154   04/01/17 1600  ceFAZolin (ANCEF) IVPB 2g/100 mL premix     2 g 200 mL/hr over 30 Minutes Intravenous Every 8 hours 04/01/17 1100 04/02/17 0056   04/01/17 0828  bacitracin 50,000 Units in sodium chloride irrigation 0.9 % 500 mL irrigation  Status:  Discontinued       As needed 04/01/17 0837 04/01/17 0947   04/01/17 0600  piperacillin-tazobactam (ZOSYN) IVPB 3.375 g  Status:  Discontinued     3.375 g 12.5 mL/hr over 240 Minutes Intravenous Every 8 hours 04/01/17 0146 04/01/17 0154   04/01/17 0600  vancomycin (VANCOCIN) IVPB 750 mg/150 ml premix  Status:  Discontinued     750 mg 150 mL/hr over 60 Minutes Intravenous Every 12 hours 04/01/17 0225 04/03/17 0959   04/01/17 0400  piperacillin-tazobactam (ZOSYN) IVPB 3.375 g  Status:  Discontinued     3.375 g 12.5 mL/hr over 240 Minutes Intravenous Every 8 hours 04/01/17 0225 04/03/17 0959       Subjective:  Ongoing headache.  Better some days than others.  Cough improved and therefore headache  a little better today.  Denies chest pains, nausea.    Objective: Vitals:   05/02/17 1051 05/02/17 1422 05/02/17 1652 05/02/17 1704  BP:  107/74 116/75 118/74  Pulse: 85 90  90  Resp:   18 18  Temp:  98.4 F (36.9 C) 98.4 F (36.9 C) 98.4 F (36.9 C)  TempSrc:  Axillary Oral Axillary  SpO2: 96% 98% 97% 98%  Weight:      Height:        Intake/Output Summary (Last 24 hours) at 05/02/17 1805 Last data filed at 05/02/17 1000  Gross per 24 hour  Intake                0 ml  Output             1500 ml  Net            -1500 ml   Filed Weights   04/25/17 0412 04/26/17 0416 04/26/17 2055  Weight: 88.4 kg (194 lb 14.2 oz) 85.5 kg (188 lb 7.9 oz) 88.7 kg (195 lb 8.8 oz)    Examination:  General exam:  Adult male.  No acute distress.  HEENT:  Bulging of the right temporoparietal area where craniectomy was performed, MMM, tracheostomy in place Respiratory system: decreased rhonchi, no wheeze or  rales Cardiovascular system: Regular rate and rhythm, normal S1/S2. No murmurs, rubs, gallops or clicks.  Warm extremities Gastrointestinal system: Normal active bowel sounds, soft, nondistended, nontender.  PEG tube MSK:  Normal tone and bulk, no lower extremity edema Neuro:  Left hemiparesis  Data Reviewed: I have personally reviewed following labs and imaging studies  CBC:  Recent Labs Lab 04/26/17 0340 05/02/17 0527  WBC 16.0* 13.4*  NEUTROABS 10.7*  --   HGB 10.7* 10.6*  HCT 35.3* 36.6*  MCV 95.1 95.8  PLT 560* 494   Basic Metabolic Panel:  Recent Labs Lab 04/26/17 0340 05/02/17 0527  NA 137 138  K 4.4 4.3  CL 102 101  CO2 29 30  GLUCOSE 167* 142*  BUN 29* 31*  CREATININE 0.99 0.77  CALCIUM 10.9* 10.3   GFR: Estimated Creatinine Clearance: 115.6 mL/min (by C-G formula based on SCr of 0.77 mg/dL). Liver Function Tests:  Recent Labs Lab 04/26/17 0340 05/02/17 0527  AST 20 19  ALT 24 20  ALKPHOS 73 60  BILITOT 0.2* 0.2*  PROT 8.3* 7.2  ALBUMIN 2.5* 2.5*    Recent Labs Lab 05/02/17 0527  LIPASE 33   No results for input(s): AMMONIA in the last 168 hours. Coagulation Profile: No results for input(s): INR, PROTIME in the last 168 hours. Cardiac Enzymes: No results for input(s): CKTOTAL, CKMB, CKMBINDEX, TROPONINI in the last 168 hours. BNP (last 3 results) No results for input(s): PROBNP in the last 8760 hours. HbA1C: No results for input(s): HGBA1C in the last 72 hours. CBG:  Recent Labs Lab 05/01/17 2355 05/02/17 0358 05/02/17 0721 05/02/17 1120 05/02/17 1638  GLUCAP 144* 126* 152* 156* 119*   Lipid Profile: No results for input(s): CHOL, HDL, LDLCALC, TRIG, CHOLHDL, LDLDIRECT in the last 72 hours. Thyroid Function Tests: No results for input(s): TSH, T4TOTAL, FREET4, T3FREE, THYROIDAB in the last 72 hours. Anemia Panel: No results for input(s): VITAMINB12, FOLATE, FERRITIN, TIBC, IRON, RETICCTPCT in the last 72 hours. Urine  analysis:    Component Value Date/Time   COLORURINE YELLOW 05/02/2017 1353   APPEARANCEUR CLOUDY (A) 05/02/2017 1353   LABSPEC 1.017 05/02/2017 1353   PHURINE 7.0 05/02/2017 1353  GLUCOSEU NEGATIVE 05/02/2017 1353   HGBUR NEGATIVE 05/02/2017 1353   BILIRUBINUR NEGATIVE 05/02/2017 1353   KETONESUR NEGATIVE 05/02/2017 1353   PROTEINUR 100 (A) 05/02/2017 1353   NITRITE NEGATIVE 05/02/2017 1353   LEUKOCYTESUR NEGATIVE 05/02/2017 1353   Sepsis Labs: @LABRCNTIP (procalcitonin:4,lacticidven:4)  ) No results found for this or any previous visit (from the past 240 hour(s)).    Radiology Studies: Dg Abd Portable 1v  Result Date: 05/02/2017 CLINICAL DATA:  Abdomen pain EXAM: PORTABLE ABDOMEN - 1 VIEW COMPARISON:  None. FINDINGS: The bowel gas pattern is normal. No radio-opaque calculi or other significant radiographic abnormality are seen. Surgical clips are projected over the right pelvis. IMPRESSION: No acute abnormality. Electronically Signed   By: Abelardo Diesel M.D.   On: 05/02/2017 15:33     Scheduled Meds: . acetaminophen (TYLENOL) oral liquid 160 mg/5 mL  650 mg Per Tube TID  . aspirin  325 mg Per Tube Daily  . chlorhexidine  15 mL Mouth Rinse BID  . feeding supplement (PRO-STAT SUGAR FREE 64)  30 mL Per Tube BID  . free water  250 mL Per Tube Q8H  . gabapentin  300 mg Oral QHS  . glycopyrrolate  1 mg Per Tube BID  . heparin subcutaneous  5,000 Units Subcutaneous Q8H  . insulin aspart  0-20 Units Subcutaneous Q4H  . insulin detemir  34 Units Subcutaneous Daily  . metoprolol tartrate  12.5 mg Per Tube BID  . pantoprazole sodium  40 mg Per Tube Daily   Continuous Infusions: . feeding supplement (GLUCERNA 1.2 CAL) 1,000 mL (05/02/17 0514)     LOS: 31 days    Time spent: 30 min    Janece Canterbury, MD [facility] Pager [phone]  If 7PM-7AM, please contact night-coverage [email or web address] Password [facility] 05/02/2017, 6:05 PM

## 2017-05-02 NOTE — Care Management Note (Signed)
Case Management Note  Patient Details  Name: Joe Wilkinson MRN: [id number] Date of Birth: [birth year]  Subjective/Objective:                    Action/Plan: Patient on hard to place list for CSW. CM following for d/c disposition.  Expected Discharge Date:  04/28/17               Expected Discharge Plan:  [place]  In-House Referral:  Clinical Social Work, Scientist, research (medical)  CM Consult  Post Acute Care Choice:    Choice offered to:     DME Arranged:    DME Agency:     HH Arranged:    [place] Agency:     Status of Service:  In process, will continue to follow  If discussed at Long Length of Stay Meetings, dates discussed:  05/02/2017  Additional Comments:  Pollie Friar, RN 05/02/2017, 1:45 PM

## 2017-05-02 NOTE — Progress Notes (Signed)
Pharmacy consult: Lovenox for VTE prophylaxis  33 YOM has been in the hospital since the end of August. He has been on sq heparin for VTE prophylaxis, pharmacy is consulted to switch it to sq lovenox. CBC has been stable. crcl > 100 ml/min. Last sq heparin received at 1440 today  Plan:  lovenox 40 mg qhs Pharmacy sign off.   Thanks!   Maryanna Shape, PharmD, BCPS  Clinical Pharmacist  Pager: [phone]

## 2017-05-03 LAB — GLUCOSE, CAPILLARY
GLUCOSE-CAPILLARY: 124 mg/dL — AB (ref 65–99)
GLUCOSE-CAPILLARY: 146 mg/dL — AB (ref 65–99)
Glucose-Capillary: 132 mg/dL — ABNORMAL HIGH (ref 65–99)
Glucose-Capillary: 140 mg/dL — ABNORMAL HIGH (ref 65–99)
Glucose-Capillary: 123 mg/dL — ABNORMAL HIGH (ref 65–99)
Glucose-Capillary: 139 mg/dL — ABNORMAL HIGH (ref 65–99)

## 2017-05-03 NOTE — Progress Notes (Signed)
Physical Therapy Treatment Patient Details Name: Joe Wilkinson MRN: [id number] DOB: Dec 08, 1960 Today's Date: 05/03/2017    History of Present Illness 56 yo male brought to [facility] with altered mental status from DKA and sepsis.  Found to have RT MCA CVA and transfer to [facility].  Required Rt decompressive craniectomy and remained on vent post op.ETT 8/23-9/5, s/p tracheostomy 9/5    PT Comments    Patient progressing well with cognition and showed improvements in activity tolerance and task performance. At this time, patient is able to follow single step commands with consistency, able to follow 2 step commands intermittently and shows ability to attend to the left with cues. Patient with improvements in trunk control but continues to break midline with fatigue and duel task. At this time, current POC remains appropriate. Will continue to see and progress as tolerated.   Follow Up Recommendations  SNF;Supervision/Assistance - 24 hour     Equipment Recommendations  Other (comment) (TBD)    Recommendations for Other Services       Precautions / Restrictions Precautions Precautions: Fall;Other (comment) Precaution Comments: craniectomy, no bone flap right side; trach in place; peg tube Restrictions Weight Bearing Restrictions: Yes    Mobility  Bed Mobility Overal bed mobility: Needs Assistance Bed Mobility: Rolling Rolling: Max assist;Total assist   Supine to sit: Max assist;+2 for physical assistance;HOB elevated Sit to supine: +2 for physical assistance;Total assist   General bed mobility comments: rolled towards right side, patient able to initiate right RUE and LE movement to EOB, manual hand placement on rail right side to pull to sit, +2 max to elevate trunk and rotate hips to EOB. Helicopter technique to return to supine.  Transfers                 General transfer comment: unable   Ambulation/Gait         Gait velocity: unable   General Gait Details:  unable   Stairs            Wheelchair Mobility    Modified Rankin (Stroke Patients Only) Modified Rankin (Stroke Patients Only) Pre-Morbid Rankin Score: No symptoms Modified Rankin: Severe disability     Balance Overall balance assessment: Needs assistance Sitting-balance support: Feet supported;Single extremity supported Sitting balance-Leahy Scale: Poor Sitting balance - Comments: patient able to tolerated ~20 minutes EOB with improvements in sitting tolerance and balance. At times patient able to maintain min guard without tactile support, upon fatigue required min to moderate assist with left lateral lean Postural control: Left lateral lean                                  Cognition Arousal/Alertness: Awake/alert Behavior During Therapy: Flat affect Overall Cognitive Status: Difficult to assess Area of Impairment: Problem solving;Awareness;Safety/judgement;Following commands;Attention;Memory                   Current Attention Level: Sustained Memory: Decreased recall of precautions;Decreased short-term memory (asking for a drink) Following Commands: Follows one step commands with increased time;Follows one step commands inconsistently Safety/Judgement: Decreased awareness of safety;Decreased awareness of deficits Awareness: Intellectual Problem Solving: Difficulty sequencing;Slow processing;Requires verbal cues;Requires tactile cues General Comments: L inattention, repetition and multimodal cues required for 2 step commands      Exercises Other Exercises Other Exercises: Sitting balance and trunk control activities at EOB.. Reaching with RUE Other Exercises: continued to work on visual field activities performed in sitting in  conjunction with reaching via cup stacking  Other Exercises: Weight bearing thorught LUE with assist    General Comments        Pertinent Vitals/Pain Pain Assessment: 0-10 Pain Score: 5  Pain Location:  neck/head Pain Descriptors / Indicators: Headache Pain Intervention(s): Monitored during session    Home Living                      Prior Function            PT Goals (current goals can now be found in the care plan section) Acute Rehab PT Goals Patient Stated Goal: none stated PT Goal Formulation: Patient unable to participate in goal setting Time For Goal Achievement: 05/04/17 Potential to Achieve Goals: Fair Progress towards PT goals: Progressing toward goals    Frequency    Min 3X/week      PT Plan Current plan remains appropriate    Co-evaluation PT/OT/SLP Co-Evaluation/Treatment: Yes Reason for Co-Treatment: Complexity of the patient's impairments (multi-system involvement) PT goals addressed during session: Mobility/safety with mobility        AM-PAC PT "6 Clicks" Daily Activity  Outcome Measure  Difficulty turning over in bed (including adjusting bedclothes, sheets and blankets)?: Unable Difficulty moving from lying on back to sitting on the side of the bed? : Unable Difficulty sitting down on and standing up from a chair with arms (e.g., wheelchair, bedside commode, etc,.)?: Unable Help needed moving to and from a bed to chair (including a wheelchair)?: Total Help needed walking in hospital room?: Total Help needed climbing 3-5 steps with a railing? 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

## 2017-05-03 NOTE — Progress Notes (Signed)
Occupational Therapy Treatment Patient Details Name: Joe Wilkinson MRN: [id number] DOB: 1961/04/11 Today's Date: 05/03/2017    History of present illness 56 yo male brought to [facility] with altered mental status from DKA and sepsis.  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  Required Rt decompressive craniectomy and remained on vent post op.ETT 8/23-9/5, s/p tracheostomy 9/5   OT comments  Pt progressing towards established OT goals and showed progress in cognition and attention to L side.  Pt performed cup stacking excercises to increase attention to L side and maintain upright posture at EOB. Pt requiring Min Guard A and then occasional Mod A to correct L lateral lean.  Pt following two-step oral care task with Mod cues to locate tooth brush of L side. Will continues to follow acutely to facilitate safe dc. Continues to recommend SNF for further OT.    Follow Up Recommendations  SNF;Supervision/Assistance - 24 hour    Equipment Recommendations  Other (comment) (Defer to next venue)    Recommendations for Other Services      Precautions / Restrictions Precautions Precautions: Fall;Other (comment) Precaution Comments: craniectomy, no bone flap right side; trach in place; peg tube Restrictions Weight Bearing Restrictions: Yes Other Position/Activity Restrictions: craniectomy, no bone flap right side       Mobility Bed Mobility Overal bed mobility: Needs Assistance Bed Mobility: Rolling Rolling: Max assist;Total assist   Supine to sit: Max assist;+2 for physical assistance;HOB elevated Sit to supine: +2 for physical assistance;Total assist   General bed mobility comments: rolled towards right side, patient able to initiate right RUE and LE movement to EOB, manual hand placement on rail right side to pull to sit, +2 max to elevate trunk and rotate hips to EOB. Helicopter technique to return to supine.  Transfers                 General transfer comment: unable      Balance Overall balance assessment: Needs assistance Sitting-balance support: Feet supported;Single extremity supported Sitting balance-Leahy Scale: Poor Sitting balance - Comments: patient able to tolerated ~20 minutes EOB with improvements in sitting tolerance and balance. At times patient able to maintain min guard without tactile support, upon fatigue required min to moderate assist with left lateral lean Postural control: Left lateral lean                                 ADL either performed or assessed with clinical judgement   ADL Overall ADL's : Needs assistance/impaired Eating/Feeding: NPO   Grooming: Oral care;Sitting;Cueing for sequencing;Cueing for safety;Moderate assistance Grooming Details (indicate cue type and reason): Pt sat EOB for ~20 min performing theraputic stacking activity and oral care. Pt requiring Min-Mod A to correct L lateral leans, but requiring Min Guard A for majority of sitting EOB. Pt requiring increased assistance as he fatigued. Pt performed two-step grooming task to find tooth brush on L side of table and then dip it in water. Pt requiring increase verbal and visual cues to locate items on L side of table, but demonstrating increased attention compared to prior sessions.                                General ADL Comments: Pt performed grooming task at EOB with Min Guard A and occasional Mod A to correct posture (after lateral lean to L). Pt  demonstrating increased engagement, attention to L, and following of two-step commands. Had pt practice lifting LUE with R hand; pt required hand over hand and Mod A to lift at elbow. Facilitate weight bearing through LUE.     Vision Patient Visual Report: Other (comment) (right gaze preference) Vision Assessment?: Vision impaired- to be further tested in functional context Additional Comments: L inattention and R side gaze. pt presenting with increase attention to L side - requires visual  and verbal cues   Perception     Praxis      Cognition Arousal/Alertness: Awake/alert Behavior During Therapy: Flat affect Overall Cognitive Status: Difficult to assess Area of Impairment: Problem solving;Awareness;Safety/judgement;Following commands;Attention;Memory                   Current Attention Level: Sustained Memory: Decreased recall of precautions;Decreased short-term memory Following Commands: Follows one step commands with increased time;Follows one step commands inconsistently;Follows multi-step commands inconsistently;Follows multi-step commands with increased time Safety/Judgement: Decreased awareness of safety;Decreased awareness of deficits Awareness: Intellectual Problem Solving: Difficulty sequencing;Slow processing;Requires verbal cues;Requires tactile cues (Visual cues) General Comments: L inattention, repetition and multimodal cues required for 2 step commands        Exercises Other Exercises Other Exercises: Sitting balance and trunk control activities at EOB.. Reaching with RUE Other Exercises: continued to work on visual field activities performed in sitting in conjunction with reaching via cup stacking  Other Exercises: Weight bearing thorught LUE with assist   Shoulder Instructions       General Comments      Pertinent Vitals/ Pain       Pain Assessment: 0-10 Pain Score: 5  Pain Location: neck/head Pain Descriptors / Indicators: Headache Pain Intervention(s): Monitored during session;Repositioned  Home Living Family/patient expects to be discharged to:: Skilled nursing facility Living Arrangements: Alone Available Help at Discharge: Available 24 hours/day;Friend(s) Type of Home: Apartment Home Access: Level entry     Home Layout: One level     Bathroom Shower/Tub: Occupational psychologist: Standard                Prior Functioning/Environment Level of Independence: Independent            Frequency  Min  2X/week        Progress Toward Goals  OT Goals(current goals can now be found in the care plan section)  Progress towards OT goals: Progressing toward goals  Acute Rehab OT Goals Patient Stated Goal: none stated OT Goal Formulation: Patient unable to participate in goal setting ADL Goals Pt Will Perform Grooming: with min assist;sitting Additional ADL Goal #1: Pt will perform bed mobility as precursor to ADL at min A level Additional ADL Goal #2: Pt will track items with multimodal cues past mdline 50% of the time Additional ADL Goal #3: Pt will tolerate sitting EOB with min A as precursor to performing ADL  Plan Discharge plan remains appropriate    Co-evaluation    PT/OT/SLP Co-Evaluation/Treatment: Yes Reason for Co-Treatment: Complexity of the patient's impairments (multi-system involvement) PT goals addressed during session: Mobility/safety with mobility OT goals addressed during session: ADL's and self-care      AM-PAC PT "6 Clicks" Daily Activity     Outcome Measure   Help from another person eating meals?: Total Help from another person taking care of personal grooming?: A Lot Help from another person toileting, which includes using toliet, bedpan, or urinal?: Total Help from another person bathing (including washing, rinsing, drying)?: Total Help from  another person to put on and taking off regular upper body clothing?: Total Help from another person to put on and taking off regular lower body clothing?: Total 6 Click Score: 7    End of Session Equipment Utilized During Treatment: Oxygen  OT Visit Diagnosis: Unsteadiness on feet (R26.81);Muscle weakness (generalized) (M62.81);Low vision, both eyes (H54.2);Other symptoms and signs involving the nervous system (R29.898);Other symptoms and signs involving cognitive function;Hemiplegia and hemiparesis;Pain Hemiplegia - Right/Left: Left Hemiplegia - dominant/non-dominant: Non-Dominant Hemiplegia - caused by:  Cerebral infarction Pain - Right/Left: Right Pain - part of body:  (head)   Activity Tolerance Patient tolerated treatment well   Patient Left in bed;with call bell/phone within reach;with bed alarm set;with SCD's reapplied   Nurse Communication Mobility status        Time: 7628-3151 OT Time Calculation (min): 35 min  Charges: OT General Charges $OT Visit: 1 Visit OT Treatments $Self Care/Home Management : 8-22 mins  [place], OTR/L Acute Rehab Pager: [phone] Office: [place] 05/03/2017, 1:34 PM

## 2017-05-03 NOTE — Progress Notes (Signed)
PROGRESS NOTE    Joe Wilkinson  MWN:[id number] DOB: 1960/11/30 DOA: 03/31/2017 PCP: Patient, No Pcp Per    Brief Narrative: 81 DM who was [place] bus [place], taken to [facility] found to be in DKA with sepsis from uncertain source.  He was [place] when found and requiredintubation.  He had hemiparesis secondary to massive right MCA and PCA territory infarcts with mass effect.  He was transferred to the ICU and underwent decompressive right craniectomy 8/25.  Hypertonic saline protocol was completed.  He required transfusion support and was treated for AFib with RVR, continued on the ventilator, eventually receiving trach 9/5 and PEG 9/6, on ATC 9/7.  Patient does not have health insurance and because of his craniectomy without the ability to fit a helmet, most skilled nursing facilities are unwilling to accept him.  No new event.  Currently he is getting meds thru PEG and continuous TF.  Heparin was converted to prophylaxis Lovenox.     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  Secretions improved with diuresis, likely had some acute on chronic diastolic dysfunction from a-fib - Treated for S. agalactiae PNA -  Subsequently treated with zosyn 9/1 - 9/8 for aspiration  Septic shock due to S. agalactiae pneumonia: -Resolved, completed abx 8/30.   Peri-anal cellulitis with small abcess, resolved.  Completed clindamycin on 9.22. Gen surgery evaluated and determined no surgical drainage necessary  Massive right MCA territory infarct with PCA watershed infarct, subsequent cytotoxic edema  requiring decompressive craniectomy, hypertonic saline, now with tracheostomy and PEG - ASA per neurology - Now trach/PEG-dependent. Tube feeding at goal - Follow up with neurosurgery in 2 months to assess for cranioplasty - scheduled Tylenol and occasional ibuprofen for headache  AFib with RVR, CHad2Vasc2 score=4, likely caused by acute illness - has maintained NSR, no longer on telemetry - Continue ASA, initially not on full dose a/c due to anemia requiring 4 units of PRBC, but since his hemoglobin has been stable and improving for a while, consider starting full code a/c if okay with neurosurgery/neurology - Continue metprolol  Acute blood loss anemia, hemoglobin gradually improving - CT abdomen negative, no gross GI bleeding. EGD at time of PEG placement showed normal esophagus, stomach, duodenal bulb. Required transfusions x4.  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   Mild diffuse abdominal pains, new.  May be gas pains -  LFTs, lipase: Within normal limits -  Urinalysis and urine culture:  Negative for nitrites, leukocyte esterase, 0-5 WBC (negative) -  KUB: Normal  DVT prophylaxis: lovenox Code Status:Full Family Communication:None at bedside  Disposition Plan:awaiting placement at SNF  Consultants:   [place]  Neurology  Neurosurgery  gen surg  Antimicrobials: Anti-infectives    Start     Dose/Rate Route Frequency Ordered Stop  04/26/17 0600  vancomycin (VANCOCIN) IVPB 750 mg/150 ml premix  Status:  Discontinued     750 mg 150 mL/hr over 60 Minutes Intravenous Every 12 hours 04/25/17 1729 04/25/17 1730   04/25/17 1830  clindamycin (CLEOCIN) capsule 300 mg     300 mg Oral Every 6  hours 04/25/17 1730 04/29/17 1717   04/25/17 1800  vancomycin (VANCOCIN) IVPB 750 mg/150 ml premix  Status:  Discontinued     750 mg 150 mL/hr over 60 Minutes Intravenous Every 12 hours 04/25/17 1728 04/25/17 1729   04/23/17 0945  piperacillin-tazobactam (ZOSYN) IVPB 3.375 g  Status:  Discontinued     3.375 g 12.5 mL/hr over 240 Minutes Intravenous Every 8 hours 04/23/17 0930 04/25/17 1730   04/23/17 0430  vancomycin (VANCOCIN) IVPB 1000 mg/200 mL premix  Status:  Discontinued     1,000 mg 200 mL/hr over 60 Minutes Intravenous Every 12 hours 04/22/17 1655 04/25/17 1728   04/22/17 1630  vancomycin (VANCOCIN) IVPB 1000 mg/200 mL premix     1,000 mg 200 mL/hr over 60 Minutes Intravenous  Once 04/22/17 1627 04/22/17 1826   04/08/17 0200  piperacillin-tazobactam (ZOSYN) IVPB 3.375 g  Status:  Discontinued     3.375 g 12.5 mL/hr over 240 Minutes Intravenous Every 8 hours 04/08/17 0058 04/15/17 1601   04/08/17 0100  piperacillin-tazobactam (ZOSYN) IVPB 3.375 g  Status:  Discontinued     3.375 g 100 mL/hr over 30 Minutes Intravenous Every 6 hours 04/08/17 0054 04/08/17 0057   04/03/17 1200  ampicillin (OMNIPEN) 2 g in sodium chloride 0.9 % 50 mL IVPB     2 g 150 mL/hr over 20 Minutes Intravenous Every 6 hours 04/03/17 1005 04/06/17 1837   04/01/17 1800  vancomycin (VANCOCIN) IVPB 1000 mg/200 mL premix  Status:  Discontinued     1,000 mg 200 mL/hr over 60 Minutes Intravenous Every 24 hours 04/01/17 0146 04/01/17 0154   04/01/17 1600  ceFAZolin (ANCEF) IVPB 2g/100 mL premix     2 g 200 mL/hr over 30 Minutes Intravenous Every 8 hours 04/01/17 1100 04/02/17 0056   04/01/17 0828  bacitracin 50,000 Units in sodium chloride irrigation 0.9 % 500 mL irrigation  Status:  Discontinued       As needed 04/01/17 0837 04/01/17 0947   04/01/17 0600  piperacillin-tazobactam (ZOSYN) IVPB 3.375 g  Status:  Discontinued     3.375 g 12.5 mL/hr over 240 Minutes Intravenous Every 8 hours 04/01/17 0146 04/01/17 0154    04/01/17 0600  vancomycin (VANCOCIN) IVPB 750 mg/150 ml premix  Status:  Discontinued     750 mg 150 mL/hr over 60 Minutes Intravenous Every 12 hours 04/01/17 0225 04/03/17 0959   04/01/17 0400  piperacillin-tazobactam (ZOSYN) IVPB 3.375 g  Status:  Discontinued     3.375 g 12.5 mL/hr over 240 Minutes Intravenous Every 8 hours 04/01/17 0225 04/03/17 0959       Subjective:   He gave me a "thumb up"  Objective: Vitals:   05/03/17 0548 05/03/17 0730 05/03/17 0930 05/03/17 1000  BP: 132/81 123/82  111/83  Pulse: 96 94 92 97  Resp: 18 (!) 22 16 20   Temp: 98.7 F (37.1 C) 98.8 F (37.1 C)  99.5 F (37.5 C)  TempSrc: Axillary Oral  Oral  SpO2: 99% 100% 100% 97%  Weight:      Height:        Intake/Output Summary (Last 24 hours) at 05/03/17 1020 Last data filed at 05/03/17 0730  Gross per 24  hour  Intake                0 ml  Output             1475 ml  Net            -1475 ml   Filed Weights   04/25/17 0412 04/26/17 0416 04/26/17 2055  Weight: 88.4 kg (194 lb 14.2 oz) 85.5 kg (188 lb 7.9 oz) 88.7 kg (195 lb 8.8 oz)    Examination:   General exam:  Adult male.  No acute distress.  HEENT:  Bulging of the right temporoparietal area where craniectomy was performed, MMM, tracheostomy in place Respiratory system: decreased rhonchi, no wheeze or rales Cardiovascular system: Regular rate and rhythm, normal S1/S2. No murmurs, rubs, gallops or clicks.  Warm extremities Gastrointestinal system: Normal active bowel sounds, soft, nondistended, nontender.  PEG tube MSK:  Normal tone and bulk, no lower extremity edema Neuro:  Left hemiparesis  CBC:  Recent Labs Lab 05/02/17 0527  WBC 13.4*  HGB 10.6*  HCT 36.6*  MCV 95.8  PLT 654   Basic Metabolic Panel:  Recent Labs Lab 05/02/17 0527  NA 138  K 4.3  CL 101  CO2 30  GLUCOSE 142*  BUN 31*  CREATININE 0.77  CALCIUM 10.3   Liver Function Tests:  Recent Labs Lab 05/02/17 0527  AST 19  ALT 20  ALKPHOS 60    BILITOT 0.2*  PROT 7.2  ALBUMIN 2.5*    Recent Labs Lab 05/02/17 0527  LIPASE 33   CBG:  Recent Labs Lab 05/02/17 1638 05/02/17 2026 05/03/17 0043 05/03/17 0431 05/03/17 0734  GLUCAP 119* 122* 124* 146* 132*    Radiology Studies: Dg Abd Portable 1v  Result Date: 05/02/2017 CLINICAL DATA:  Abdomen pain EXAM: PORTABLE ABDOMEN - 1 VIEW COMPARISON:  None. FINDINGS: The bowel gas pattern is normal. No radio-opaque calculi or other significant radiographic abnormality are seen. Surgical clips are projected over the right pelvis. IMPRESSION: No acute abnormality. Electronically Signed   By: Abelardo Diesel M.D.   On: 05/02/2017 15:33    Scheduled Meds: . acetaminophen (TYLENOL) oral liquid 160 mg/5 mL  650 mg Per Tube TID  . aspirin  325 mg Per Tube Daily  . chlorhexidine  15 mL Mouth Rinse BID  . enoxaparin (LOVENOX) injection  40 mg Subcutaneous Q24H  . feeding supplement (PRO-STAT SUGAR FREE 64)  30 mL Per Tube BID  . free water  250 mL Per Tube Q8H  . gabapentin  300 mg Oral QHS  . glycopyrrolate  1 mg Per Tube BID  . insulin aspart  0-20 Units Subcutaneous Q4H  . insulin detemir  34 Units Subcutaneous Daily  . metoprolol tartrate  12.5 mg Per Tube BID  . pantoprazole sodium  40 mg Per Tube Daily   Continuous Infusions: . feeding supplement (GLUCERNA 1.2 CAL) 1,000 mL (05/02/17 2213)     LOS: 32 days   Oracio Galen, MD FACP Hospitalist.   If 7PM-7AM, please contact night-coverage [email or web address] Password [email or web address] 05/03/2017, 10:20 AM

## 2017-05-03 NOTE — Progress Notes (Signed)
  Speech Language Pathology Treatment: Cognitive-Linquistic  Patient Details Name: Karey Stucki MRN: [id number] DOB: July 14, 1961 Today's Date: 05/03/2017 Time: 1443-1540 SLP Time Calculation (min) (ACUTE ONLY): 19 min  Assessment / Plan / Recommendation Clinical Impression  Treatment session focused primarily on cognitive goals as PMV could not be located during session (ultimately found by nursing at completion of treatment). Pt is looking to midline and turning his head to the L with Min cues now, but still needs Max cues to identify items in his L visual field. He is oriented to person, location and situation. He also knows the month but not the year. His speech, with use of digital occlusion to facilitate volume, is clearer than the last time this SLP saw him for tx. He followed simple commands with Min cues. Will continue to follow for additional cognitive, PMV, and dysphagia tx.    HPI HPI: 56 yo male brought to [facility] with altered mental status from DKA and sepsis. 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 Required Rt decompressive craniectomy and remained on vent post op.ETT 8/23-9/5, s/p tracheostomy 9/5.       SLP Plan  Continue with current plan of care       Recommendations  Diet recommendations: NPO;Other(comment) (ice chips okay with PMV in place after oral care) Medication Administration: Via alternative means      Patient may use Passy-Muir Speech Valve: Intermittently with supervision;During all therapies with supervision;During PO intake/meals PMSV Supervision: Full         Oral Care Recommendations: Oral care QID Follow up Recommendations: Skilled Nursing facility SLP Visit Diagnosis: Cognitive communication deficit (G86.761) Plan: Continue with current plan of care       GO                [name] 05/03/2017, 4:55 PM  [name], M.A. CCC-SLP [phone]

## 2017-05-04 LAB — GLUCOSE, CAPILLARY
GLUCOSE-CAPILLARY: 140 mg/dL — AB (ref 65–99)
GLUCOSE-CAPILLARY: 152 mg/dL — AB (ref 65–99)
GLUCOSE-CAPILLARY: 139 mg/dL — AB (ref 65–99)
GLUCOSE-CAPILLARY: 140 mg/dL — AB (ref 65–99)
Glucose-Capillary: 98 mg/dL (ref 65–99)
Glucose-Capillary: 123 mg/dL — ABNORMAL HIGH (ref 65–99)

## 2017-05-04 LAB — CBC
HCT: 39.2 % (ref 39.0–52.0)
Hemoglobin: 11.5 g/dL — ABNORMAL LOW (ref 13.0–17.0)
MCH: 28.3 pg (ref 26.0–34.0)
MCHC: 29.3 g/dL — AB (ref 30.0–36.0)
MCV: 96.3 fL (ref 78.0–100.0)
PLATELETS: 276 10*3/uL (ref 150–400)
RBC: 4.07 MIL/uL — AB (ref 4.22–5.81)
RDW: 17 % — ABNORMAL HIGH (ref 11.5–15.5)
WBC: 17.2 10*3/uL — ABNORMAL HIGH (ref 4.0–10.5)

## 2017-05-04 NOTE — Progress Notes (Signed)
PULMONARY / CRITICAL CARE MEDICINE   Name: Joe Wilkinson MRN: [id number] DOB: 1961-03-15    ADMISSION DATE:  03/31/2017 CHIEF COMPLAINT:  CVA  HISTORY OF PRESENT ILLNESS:   56 yo male brought to [facility] with altered mental status from DKA and sepsis.  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  Required Rt decompressive craniectomy and remained on vent post op.  SUBJECTIVE:   No acute events.   Remains on ATC at 28%. Improved secretions after diuresis  VITAL SIGNS: BP 125/79 (BP Location: Left Arm)   Pulse 96   Temp 97.9 F (36.6 C) (Oral)   Resp 18   Ht 5\' 10"  (1.778 m)   Wt 195 lb 8.8 oz (88.7 kg)   SpO2 100%   BMI 28.06 kg/m   VENTILATOR SETTINGS: FiO2 (%):  [28 %] 28 %  INTAKE / OUTPUT: I/O last 3 completed shifts: In: -  Out: 1475 [Urine:1475]  PHYSICAL EXAMINATION:  General:  Chronically ill appearing male sitting upright in bed in NAD HEENT: MM pink/moist, #4 cuffless trach c/d/i, midline, minimal clear secretions, bulge to right head s/p crani, PERRL Neuro: Awake, follows simple commands, moves right, no movement on left CV:  rrr, no m/r/g PULM: even/non-labored on ATC, lungs bilaterally clear, diminished in bases GI: soft, bs +, peg tube w/abd binder Extremities: warm/dry, no edema  Skin: no rashes   BMET  Recent Labs Lab 05/02/17 0527  NA 138  K 4.3  CL 101  CO2 30  BUN 31*  CREATININE 0.77  GLUCOSE 142*    Electrolytes  Recent Labs Lab 05/02/17 0527  CALCIUM 10.3    CBC  Recent Labs Lab 05/02/17 0527 05/04/17 0814  WBC 13.4* 17.2*  HGB 10.6* 11.5*  HCT 36.6* 39.2  PLT 308 276    Coag's No results for input(s): APTT, INR in the last 168 hours.  Sepsis Markers No results for input(s): LATICACIDVEN, PROCALCITON, O2SATVEN in the last 168 hours.  ABG No results for input(s): PHART, PCO2ART, PO2ART in the last 168 hours.  Liver Enzymes  Recent Labs Lab 05/02/17 0527  AST 19  ALT 20  ALKPHOS 60  BILITOT 0.2*  ALBUMIN  2.5*    Cardiac Enzymes No results for input(s): TROPONINI, PROBNP in the last 168 hours.  Glucose  Recent Labs Lab 05/03/17 1123 05/03/17 1653 05/03/17 2111 05/04/17 0131 05/04/17 0451 05/04/17 0829  GLUCAP 140* 123* 139* 140* 152* 139*    Imaging No results found.  STUDIES:  LP 8/24 >> glucose 205, RBC 16, protein 62, WBC 5 CT head 8/24 >> Large Rt MCA EEG 8/27 >> bilateral cerebral dysfunction, non-specific in etiology Echo 8/27 >> EF 65 to 70% CT abd/pelvis 8/30 >> third spacing, 3.5 cm low attenuation Lt hepatic lobe CT Head 9/3>> Severe right hemisphere cytotoxic edema with stable extension of edematous brain through the right craniectomy defect. Stable petechial hemorrhage without malignant hemorrhagicTransformation. Stable intracranial mass effect with leftward midline shift of 7 mm. Small left PCA territory infarct. CXR 9/23 > LLL atx and small effusion Barrium swallow 9/21 > severe oral and mod pharyngeal dysphagia; mod to severe aspiration risk  CULTURES: CSF 8/24>> negative BCX 8/24>> negative UCX 8/24>> negative Sputum 8/24>> S. Agalactiae Sputum 9/1>> Consistent with normal resp. flora  ANTIBIOTICS: vanco 8/24 >> 8/26 Zosyn 8/24 >> 8/26, 9/1 >>off  Cleocin 9/18>>> Ampicillin 8/27 > 8/30  SIGNIFICANT EVENTS: 8/23 Admit [facility] 8/23 8/24 Transfer to [facility] after Rt MCA CVA, start 3% NS  8/25 Decompressive craniectomy 8/27 Transfuse 1 unit PRBC 8/28 Transfuse 1 unit PRBC, A fib with RVR 8/30 Transfuse 1 unit PRBC 8/31 Off pressors, reintubated x 2, transfuse PRBC 9/5 Trach 9/6 PEG 9/14 Trach exchanged from 8 cuffed to 6 cuffless 9/22 Trach exchanged to 4 cuffless  LINES/TUBES: ETT 8/23 >>9/5 Trach [name]) 9/5>>> Lt IJ CVL 8/23 >>removed PEG 9/6 >  ASSESSMENT / PLAN:  Acute on chronic hypoxemic respiratory failure s/p trach  Small pleural effusion- improved Plan: - Continue ATC as tolerated since 9/7 - Titrate oxygen to Maintain saturations  88-92% - Aggressive pulmonary hygiene  - Mobilize as able - PMV per speech  - Intermittent CXR to follow effusion - keep even/ neg I/O balance - Not ready for decannulation 2/2 secretions (improving) and mental status - awaiting SNF placement  Dysphagia - barrium swallow 9/21 with severe oral and mod pharyngeal dysphagia; mod to severe aspiration risk Plan: -Continue TF per PEG (9/6) with NPO status   PCCM will follow for Trach once a week.     Kennieth Rad, AGACNP-BC [place] [facility] Pgr: [phone] or if no answer [phone] 05/04/2017, 9:27 AM

## 2017-05-04 NOTE — Progress Notes (Signed)
  Speech Language Pathology Treatment: Dysphagia;Cognitive-Linquistic  Patient Details Name: Joe Wilkinson MRN: [id number] DOB: 20-Feb-1961 Today's Date: 05/04/2017 Time: 5465-6812 SLP Time Calculation (min) (ACUTE ONLY): 30 min  Assessment / Plan / Recommendation Clinical Impression  Pt asleep when SLP entered room. He was pleasant but took more than a reasonable amount of time to arouse. SLP placed PMSV and pt able to obtain phonation. Pt required Max A assistance to perform oral care and Max A to obtain items placed to his left. Pt with no overt s/s of aspiration with trials of ice chips, thin water via spoon or cup (small sips). Recommend MBS on 05/05/17 to assess current swallow function.    HPI HPI: 56 yo male brought to [facility] with altered mental status from DKA and sepsis. 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 Required Rt decompressive craniectomy and remained on vent post op.ETT 8/23-9/5, s/p tracheostomy 9/5.       SLP Plan  Continue with current plan of care       Recommendations  Diet recommendations: NPO      Patient may use Passy-Muir Speech Valve: Intermittently with supervision;During all therapies with supervision;During PO intake/meals PMSV Supervision: Full         Oral Care Recommendations: Oral care QID Follow up Recommendations: Skilled Nursing facility SLP Visit Diagnosis: Cognitive communication deficit (X51.700) Plan: Continue with current plan of care       GO                Joe Wilkinson 05/04/2017, 2:02 PM

## 2017-05-04 NOTE — Progress Notes (Signed)
CSW continuing to follow for discharge needs. Patient remains on difficult to place list for [facility]; still no bed available at this time.  CSW to continue to follow and facilitate discharge when bed available.  Laveda Abbe, [place] Clinical Social Worker [phone]

## 2017-05-04 NOTE — Progress Notes (Signed)
PROGRESS NOTE    Joe Wilkinson  VEH:[id number] DOB: 1961-06-06 DOA: 03/31/2017 PCP: Patient, No Pcp Per    Brief Narrative: 76 DM who was found unresponsive [place] bus stop, taken to [facility] found to be in DKA with sepsis from uncertain source. He was in acute respiratory failure and had to be intubated.  He had hemiparesis secondary to massive right MCA and PCA territory infarcts with mass effect. He was transferred to the ICU and underwent decompressive right craniectomy 8/25. Hypertonic saline protocol was completed. He required transfusion support and was treated for AFib with RVR, continued on the ventilator, eventually receiving trach 9/5 and PEG 9/6, on ATC 9/7. Patient does not have health insurance and because of his craniectomy without the ability to fit a helmet, most skilled nursing facilities areunwilling to accept him.  Currently he is getting meds thru PEG and continuous TF.  Heparin was converted to prophylaxis Lovenox. He was transferred from PCCm service to [facility] service on 9/25.    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 Completed the treatment for strep agalactiae treatment.    Septic shock from pneumonia:  Resolved.    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  Aspirin as per neurology.  Follow up with neuro surgery in 2 months. As outpatient.  Will discuss with neurology regarding starting  anticoagulation now that the hemoglobin is stable.    New onset a fib with RVR  Currently in sinus.  Not on anticoagulation as he was anemic requiring 4 units of prbc transfusion.  Resume metoprolol for rate control.    Diabetes mellitus:  CBG (last 3)   Recent Labs  05/04/17 0451 05/04/17 0829 05/04/17 1135  GLUCAP 152* 139* 140*    Resume SSI. And levemir daily.    Anemia of blood loss of unclear source.  abd X ray  does not show any bleeding. FOBT is pending. Hemoglobin appears to have been stabilized between 10 to 11.    DVT prophylaxis:lovenox. Code Status: full code.  Family Communication: none at bedside.  Disposition Plan: awaiting placement at SNF.    Consultants:   PCCM   Procedures: none.  Antimicrobials:none.   Subjective: No new complaints other than slight headache.   Objective: Vitals:   05/04/17 0853 05/04/17 1015 05/04/17 1320 05/04/17 1328  BP:  123/80 112/86   Pulse:  98 85 87  Resp:  20 20 18   Temp:  (!) 97.5 F (36.4 C) 98.9 F (37.2 C)   TempSrc:  Oral Oral   SpO2: 100% 100% 98% 100%  Weight:      Height:        Intake/Output Summary (Last 24 hours) at 05/04/17 1515 Last data filed at 05/04/17 1308  Gross per 24 hour  Intake                0 ml  Output  450 ml  Net             -450 ml   Filed Weights   04/25/17 0412 04/26/17 0416 04/26/17 2055  Weight: 88.4 kg (194 lb 14.2 oz) 85.5 kg (188 lb 7.9 oz) 88.7 kg (195 lb 8.8 oz)    Examination:  General exam: Appears calm and comfortable ATC IN PLACE  Respiratory system: Clear to auscultation. Respiratory effort normal. Scattered crackles.  Cardiovascular system: S1 & S2 heard, RRR. No JVD, murmurs, rubs, gallops or clicks. No pedal edema. Gastrointestinal system: Abdomen is nondistended, soft and nontender. No organomegaly or masses felt. Normal bowel sounds heard. g tube in place.  Central nervous system: Alert and able to move all extremities.  Extremities:  no cyanosis or clubbing.  Skin: No rashes, lesions or ulcers Psychiatry: couldn't be assessed. .     Data Reviewed: I have personally reviewed following labs and imaging studies  CBC:  Recent Labs Lab 05/02/17 0527 05/04/17 0814  WBC 13.4* 17.2*  HGB 10.6* 11.5*  HCT 36.6* 39.2  MCV 95.8 96.3  PLT 308 161   Basic Metabolic Panel:  Recent Labs Lab 05/02/17 0527  NA 138  K 4.3  CL 101  CO2 30  GLUCOSE 142*  BUN 31*  CREATININE 0.77  CALCIUM 10.3   GFR: Estimated Creatinine Clearance: 115.6 mL/min (by C-G formula based on SCr of 0.77 mg/dL). Liver Function Tests:  Recent Labs Lab 05/02/17 0527  AST 19  ALT 20  ALKPHOS 60  BILITOT 0.2*  PROT 7.2  ALBUMIN 2.5*    Recent Labs Lab 05/02/17 0527  LIPASE 33   No results for input(s): AMMONIA in the last 168 hours. Coagulation Profile: No results for input(s): INR, PROTIME in the last 168 hours. Cardiac Enzymes: No results for input(s): CKTOTAL, CKMB, CKMBINDEX, TROPONINI in the last 168 hours. BNP (last 3 results) No results for input(s): PROBNP in the last 8760 hours. HbA1C: No results for input(s): HGBA1C in the last 72 hours. CBG:  Recent Labs Lab 05/03/17 2111 05/04/17 0131 05/04/17 0451 05/04/17 0829 05/04/17 1135  GLUCAP 139* 140* 152* 139* 140*   Lipid Profile: No results for input(s): CHOL, HDL, LDLCALC, TRIG, CHOLHDL, LDLDIRECT in the last 72 hours. Thyroid Function Tests: No results for input(s): TSH, T4TOTAL, FREET4, T3FREE, THYROIDAB in the last 72 hours. Anemia Panel: No results for input(s): VITAMINB12, FOLATE, FERRITIN, TIBC, IRON, RETICCTPCT in the last 72 hours. Sepsis Labs: No results for input(s): PROCALCITON, LATICACIDVEN in the last 168 hours.  Recent Results (from the past 240 hour(s))  Culture, Urine     Status: Abnormal (Preliminary result)   Collection Time: 05/02/17  1:53 PM  Result Value Ref Range Status   Specimen Description URINE, RANDOM  Final   Special  Requests NONE  Final   Culture (A)  Final    >=100,000 COLONIES/mL KLEBSIELLA PNEUMONIAE >=100,000 COLONIES/mL ESCHERICHIA COLI    Report Status PENDING  Incomplete         Radiology Studies: Dg Abd Portable 1v  Result Date: 05/02/2017 CLINICAL DATA:  Abdomen pain EXAM: PORTABLE ABDOMEN - 1 VIEW COMPARISON:  None. FINDINGS: The bowel gas pattern is normal. No radio-opaque calculi or other significant radiographic abnormality are seen. Surgical clips are projected over the right pelvis. IMPRESSION: No acute abnormality. Electronically Signed   By: Abelardo Diesel M.D.   On: 05/02/2017 15:33        Scheduled Meds: . acetaminophen (TYLENOL) oral liquid 160 mg/5 mL  650  mg Per Tube TID  . aspirin  325 mg Per Tube Daily  . chlorhexidine  15 mL Mouth Rinse BID  . enoxaparin (LOVENOX) injection  40 mg Subcutaneous Q24H  . feeding supplement (PRO-STAT SUGAR FREE 64)  30 mL Per Tube BID  . free water  250 mL Per Tube Q8H  . gabapentin  300 mg Oral QHS  . glycopyrrolate  1 mg Per Tube BID  . insulin aspart  0-20 Units Subcutaneous Q4H  . insulin detemir  34 Units Subcutaneous Daily  . metoprolol tartrate  12.5 mg Per Tube BID  . pantoprazole sodium  40 mg Per Tube Daily   Continuous Infusions: . feeding supplement (GLUCERNA 1.2 CAL) 1,000 mL (05/04/17 0814)     LOS: 33 days    Time spent: 35 minutes.     Hosie Poisson, MD [facility] Pager [phone]   If 7PM-7AM, please contact night-coverage [email or web address] Password [email or web address] 05/04/2017, 3:15 PM

## 2017-05-05 ENCOUNTER — Inpatient Hospital Stay (HOSPITAL_COMMUNITY): Payer: Medicaid Other

## 2017-05-05 LAB — GLUCOSE, CAPILLARY
GLUCOSE-CAPILLARY: 125 mg/dL — AB (ref 65–99)
GLUCOSE-CAPILLARY: 140 mg/dL — AB (ref 65–99)
GLUCOSE-CAPILLARY: 138 mg/dL — AB (ref 65–99)
Glucose-Capillary: 113 mg/dL — ABNORMAL HIGH (ref 65–99)
Glucose-Capillary: 125 mg/dL — ABNORMAL HIGH (ref 65–99)
Glucose-Capillary: 132 mg/dL — ABNORMAL HIGH (ref 65–99)

## 2017-05-05 LAB — URINE CULTURE: Culture: >=100000 — AB

## 2017-05-05 LAB — OCCULT BLOOD X 1 CARD TO LAB, STOOL: Fecal Occult Bld: NEGATIVE

## 2017-05-05 MED ORDER — CEPHALEXIN 500 MG PO CAPS
500.0000 mg | ORAL_CAPSULE | Freq: Two times a day (BID) | ORAL | Status: AC
  Administered 2017-05-06 – 2017-05-15 (×20): 500 mg via ORAL
  Filled 2017-05-05 (×20): qty 1

## 2017-05-05 MED ORDER — INSULIN ASPART 100 UNIT/ML ~~LOC~~ SOLN: 0.0000 [IU] | Freq: Three times a day (TID) | SUBCUTANEOUS | Status: DC

## 2017-05-05 MED ORDER — INSULIN ASPART 100 UNIT/ML ~~LOC~~ SOLN
0.0000 [IU] | Freq: Three times a day (TID) | SUBCUTANEOUS | Status: DC
  Administered 2017-05-06: 3 [IU] via SUBCUTANEOUS
  Administered 2017-05-07: 4 [IU] via SUBCUTANEOUS
  Administered 2017-05-08 – 2017-05-10 (×4): 3 [IU] via SUBCUTANEOUS
  Administered 2017-05-11: 4 [IU] via SUBCUTANEOUS
  Administered 2017-05-11 (×2): 3 [IU] via SUBCUTANEOUS
  Administered 2017-05-12: 07:00:00 via SUBCUTANEOUS
  Administered 2017-05-13 – 2017-05-14 (×5): 3 [IU] via SUBCUTANEOUS
  Administered 2017-05-15 (×2): 4 [IU] via SUBCUTANEOUS
  Administered 2017-05-16: 3 [IU] via SUBCUTANEOUS
  Administered 2017-05-16: 4 [IU] via SUBCUTANEOUS
  Administered 2017-05-17: 3 [IU] via SUBCUTANEOUS
  Administered 2017-05-17: 4 [IU] via SUBCUTANEOUS
  Administered 2017-05-18 – 2017-05-19 (×3): 3 [IU] via SUBCUTANEOUS
  Administered 2017-05-19 – 2017-05-20 (×4): 4 [IU] via SUBCUTANEOUS
  Administered 2017-05-21 (×3): 3 [IU] via SUBCUTANEOUS
  Administered 2017-05-22 (×2): 4 [IU] via SUBCUTANEOUS
  Administered 2017-05-23: 3 [IU] via SUBCUTANEOUS
  Administered 2017-05-23: 4 [IU] via SUBCUTANEOUS
  Administered 2017-05-24 (×2): 3 [IU] via SUBCUTANEOUS
  Administered 2017-05-25: 4 [IU] via SUBCUTANEOUS
  Administered 2017-05-26 – 2017-05-28 (×4): 3 [IU] via SUBCUTANEOUS
  Administered 2017-05-29: 4 [IU] via SUBCUTANEOUS

## 2017-05-05 MED ORDER — INSULIN ASPART 100 UNIT/ML ~~LOC~~ SOLN: 0.0000 [IU] | Freq: Every day | SUBCUTANEOUS | Status: DC

## 2017-05-05 NOTE — Progress Notes (Signed)
Physical Therapy Treatment Patient Details Name: Joe Wilkinson MRN: [id number] DOB: Nov 27, 1960 Today's Date: 05/05/2017    History of Present Illness 56 yo male brought to [facility] with altered mental status from DKA and sepsis.  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  Required Rt decompressive craniectomy and remained on vent post op.ETT 8/23-9/5, s/p tracheostomy 9/5    PT Comments    Pt continues to require two person heavy physical assistance for bed mobility. Focus of session was sitting balance and trunk control. Pt would continue to benefit from skilled physical therapy services at this time while admitted and after d/c to address the below listed limitations in order to improve overall safety and independence with functional mobility.   Follow Up Recommendations  SNF;Supervision/Assistance - 24 hour     Equipment Recommendations  Other (comment) (defer to next venue)    Recommendations for Other Services       Precautions / Restrictions Precautions Precautions: Fall;Other (comment) Precaution Comments: craniectomy, no bone flap right side; trach in place; peg tube; L side neglect Restrictions Weight Bearing Restrictions: No    Mobility  Bed Mobility Overal bed mobility: Needs Assistance Bed Mobility: Supine to Sit;Sit to Supine     Supine to sit: Max assist;+2 for physical assistance;HOB elevated Sit to supine: +2 for physical assistance;Total assist   General bed mobility comments: pt able to use R UE to assist, able to move R LE off of bed, assist to elevate trunk and with L LE movement, use of bed pads to position pt's hips at EOB  Transfers                    Ambulation/Gait                 Stairs            Wheelchair Mobility    Modified Rankin (Stroke Patients Only) Modified Rankin (Stroke Patients Only) Pre-Morbid Rankin Score: No symptoms Modified Rankin: Severe disability     Balance Overall balance assessment: Needs  assistance Sitting-balance support: Feet supported;Single extremity supported Sitting balance-Leahy Scale: Poor Sitting balance - Comments: pt tolerated sitting EOB x~15 minutes with lateral and A-P weight shifting; pt fluctuating between requiring mod A to very close min guard with R UE support Postural control: Left lateral lean;Posterior lean                                  Cognition Arousal/Alertness: Awake/alert Behavior During Therapy: Flat affect Overall Cognitive Status: Difficult to assess Area of Impairment: Problem solving;Awareness;Safety/judgement;Following commands;Attention;Memory                   Current Attention Level: Sustained Memory: Decreased recall of precautions;Decreased short-term memory Following Commands: Follows one step commands with increased time;Follows one step commands inconsistently;Follows multi-step commands inconsistently;Follows multi-step commands with increased time Safety/Judgement: Decreased awareness of safety;Decreased awareness of deficits Awareness: Intellectual Problem Solving: Slow processing;Decreased initiation;Difficulty sequencing;Requires verbal cues;Requires tactile cues (multimodal cueing)        Exercises Other Exercises Other Exercises: Sitting balance and trunk control activities at EOB. L UE weight bearing with assist and tactile facilitation Other Exercises: visual tracking activities sitting EOB with multimodal cueing    General Comments        Pertinent Vitals/Pain Pain Assessment: Faces Faces Pain Scale: Hurts little more Pain Location: pt pointing to his head and R side  of necking, gesturing that it is throbbing and nodding "yes" when therapist asks Pain Descriptors / Indicators: Throbbing Pain Intervention(s): Monitored during session;Repositioned    Home Living                      Prior Function            PT Goals (current goals can now be found in the care plan section)  Acute Rehab PT Goals PT Goal Formulation: Patient unable to participate in goal setting Time For Goal Achievement: 05/18/17 Potential to Achieve Goals: Fair Progress towards PT goals: Progressing toward goals    Frequency    Min 3X/week      PT Plan Current plan remains appropriate    Co-evaluation              AM-PAC PT "6 Clicks" Daily Activity  Outcome Measure  Difficulty turning over in bed (including adjusting bedclothes, sheets and blankets)?: Unable Difficulty moving from lying on back to sitting on the side of the bed? : Unable Difficulty sitting down on and standing up from a chair with arms (e.g., wheelchair, bedside commode, etc,.)?: Unable Help needed moving to and from a bed to chair (including a wheelchair)?: Total Help needed walking in hospital room?: Total Help needed climbing 3-5 steps with a railing? : Total 6 Click Score: 6    End of Session Equipment Utilized During Treatment: Oxygen;Other (comment) (28% trach collar) Activity Tolerance: Patient tolerated treatment well Patient left: in bed;with call bell/phone within reach;with bed alarm set Nurse Communication: Mobility status;Need for lift equipment PT Visit Diagnosis: Other symptoms and signs involving the nervous system (R29.898);Hemiplegia and hemiparesis Hemiplegia - Right/Left: Left Hemiplegia - dominant/non-dominant: Non-dominant Hemiplegia - caused by: Nontraumatic intracerebral hemorrhage     Time: 1135-1156 PT Time Calculation (min) (ACUTE ONLY): 21 min  Charges:  $Therapeutic Activity: 8-22 mins                    G Codes:       [place], [place], [place] 05/05/2017, 2:00 PM

## 2017-05-05 NOTE — Progress Notes (Signed)
Modified Barium Swallow Progress Note  Patient Details  Name: Joe Wilkinson MRN: [id number] Date of Birth: 07-Dec-1960  Today's Date: 05/05/2017  Modified Barium Swallow completed.  Full report located under Chart Review in the Imaging Section.  Brief recommendations include the following:  Clinical Impression  Pt demonstrates cognitive impairment impacting swallow function. Prolonged liangual rocking and pumping occurs with most boluses, particulalry purees, though a liquid wash is beneficial to facilitate oral transit. Swallow response is delayed reuslting in flash penetration events. Otherwise oropharyngeal mechanism is WNL. Pt may initaite a puree/thin diet though intake may be poor and assistance will be needed for attention with feeding and to avoid prolonged oral holding. PMSV to be in place with all PO. WIll f/u for tolerance.    Swallow Evaluation Recommendations       SLP Diet Recommendations: Thin liquid;Dysphagia 1 (Puree) solids   Liquid Administration via: Cup;Straw   Medication Administration: Crushed with puree   Supervision: Staff to assist with self feeding;Full supervision/cueing for compensatory strategies   Compensations: Minimize environmental distractions;Follow solids with liquid   Postural Changes: Seated upright at 90 degrees   Oral Care Recommendations: Other (Comment);Oral care BID   Other Recommendations: Have oral suction available;Place PMSV during PO intake   Joe [place], MA CCC-SLP [phone]  Kiondra Caicedo, [place]onto 05/05/2017,3:22 PM

## 2017-05-05 NOTE — Progress Notes (Signed)
Patient complains of new onset right sided neck pain. Ice pack applied and pain meds given. Patient continues to complain of pain. MD notified. Will continue to monitor

## 2017-05-05 NOTE — Care Management Note (Signed)
Case Management Note  Patient Details  Name: Mitul Hallowell MRN: [id number] Date of Birth: 25-Nov-1960  Subjective/Objective:                    Action/Plan: Pt is on difficult to place list for SNF rehab. CM following.  Expected Discharge Date:  04/28/17               Expected Discharge Plan:  [place]  In-House Referral:  Clinical Social Work, Scientist, research (medical)  CM Consult  Post Acute Care Choice:    Choice offered to:     DME Arranged:    DME Agency:     HH Arranged:    [place] Agency:     Status of Service:  In process, will continue to follow  If discussed at Long Length of Stay Meetings, dates discussed:    Additional Comments:  Pollie Friar, RN 05/05/2017, 2:25 PM

## 2017-05-05 NOTE — Progress Notes (Signed)
PROGRESS NOTE    Joe Wilkinson  NWG:[id number] DOB: [birth year] DOA: 03/31/2017 PCP: Patient, No Pcp Per    Brief Narrative: 37 DM who was [place] un[place] bus [place], taken to [facility] found to be in DKA with sepsis from uncertain source. He was in acute respiratory failure and had to be intubated.  He had hemiparesis secondary to massive right MCA and PCA territory infarcts with mass effect. He was transferred to the ICU and underwent decompressive right craniectomy 8/25. Hypertonic saline protocol was completed. He required transfusion support and was treated for AFib with RVR, continued on the ventilator, eventually receiving trach 9/5 and PEG 9/6, on ATC 9/7. Patient does not have health insurance and because of his craniectomy without the ability to fit a helmet, most skilled nursing facilities areunwilling to accept him.  Currently he is getting meds thru PEG and continuous TF.  Heparin was converted to prophylaxis Lovenox. He was transferred from PCCm service to [facility] service on 9/25. Currently awaiting placement to snf.    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 Completed the treatment for strep agalactiae treatment. Continues to have secretions with severe coughing spells.  Recommend pulmonary toilet.  Appreciate pulmonary assistance.    Septic shock from pneumonia:  Resolved.    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  Aspirin as per neurology.  Follow up with neuro surgery in 2 months. As outpatient.  After his FOBT is negative, we will start anticoagulation after discussing with neurology.    New onset a fib with RVR  Currently in sinus.  Not on anticoagulation as he was anemic requiring 4 units of prbc transfusion.  Resume metoprolol for rate control.    Diabetes mellitus:  CBG (last 3)   Recent Labs  05/05/17 0432 05/05/17 0746 05/05/17 1124  GLUCAP 125* 140* 138*    Resume SSI. And levemir daily.  No change in medications.    Anemia of blood loss of unclear source.  abd X ray  does not show any bleeding. FOBT is pending. Hemoglobin appears to have been stabilized between 10 to 11.  E coli and klebsiella in urine, since he cannot verbalize urinary symptoms would treat him with keflex for 7 days.     DVT prophylaxis:lovenox. Code Status: full code.  Family Communication: none at bedside.  Disposition Plan: awaiting placement at SNF.    Consultants:   PCCM   Procedures: none.  Antimicrobials:none.   Subjective: No new complaints today.  Headache resolved.    Objective: Vitals:   05/05/17 0910 05/05/17 1216 05/05/17 1300 05/05/17 1601  BP:   132/82   Pulse: 95 86 88 94  Resp: 20 18 20 18   Temp:   98.5 F (36.9 C)   TempSrc:   Oral   SpO2: 96% 97% 99% 98%  Weight:      Height:        Intake/Output Summary (Last 24 hours) at 05/05/17  [place] filed at 05/05/17 1200  Gross per 24 hour  Intake              445 ml  Output             1300 ml  Net             -855 ml   Filed Weights   04/25/17 0412 04/26/17 0416 04/26/17 2055  Weight: 88.4 kg (194 lb 14.2 oz) 85.5 kg (188 lb 7.9 oz) 88.7 kg (195 lb 8.8 oz)    Examination:  General exam: Appears calm and comfortable ATC IN PLACE , not in distress.  Respiratory system: Clear to auscultation. Respiratory effort normal. Scattered crackles. No rhonchi.  Cardiovascular system:  S1 & S2 heard, RRR. No JVD,  No pedal edema. Gastrointestinal system: Abdomen is soft non tender non distended bowel sounds heard.  Central nervous system: Alert and able to move all extremities.  Extremities: no cyanosis or clubbing.  Skin: No rashes, lesions or ulcers Psychiatry: couldn't be assessed. .     Data Reviewed: I have personally reviewed following labs and imaging studies  CBC:  Recent Labs Lab 05/02/17 0527 05/04/17 0814  WBC 13.4* 17.2*  HGB 10.6* 11.5*  HCT 36.6* 39.2  MCV 95.8 96.3  PLT 308 568   Basic Metabolic Panel:  Recent Labs Lab 05/02/17 0527  NA 138  K 4.3  CL 101  CO2 30  GLUCOSE 142*  BUN 31*  CREATININE 0.77  CALCIUM 10.3   GFR: Estimated Creatinine Clearance: 115.6 mL/min (by C-G formula based on SCr of 0.77 mg/dL). Liver Function Tests:  Recent Labs Lab 05/02/17 0527  AST 19  ALT 20  ALKPHOS 60  BILITOT 0.2*  PROT 7.2  ALBUMIN 2.5*    Recent Labs Lab 05/02/17 0527  LIPASE 33   No results for input(s): AMMONIA in the last 168 hours. Coagulation Profile: No results for input(s): INR, PROTIME in the last 168 hours. Cardiac Enzymes: No results for input(s): CKTOTAL, CKMB, CKMBINDEX, TROPONINI in the last 168 hours. BNP (last 3 results) No results for input(s): PROBNP in the last 8760 hours. HbA1C: No results for input(s): HGBA1C in the last 72 hours. CBG:  Recent Labs Lab 05/04/17 2039 05/05/17 0014 05/05/17 0432 05/05/17 0746 05/05/17 1124  GLUCAP 123* 132* 125* 140* 138*   Lipid Profile: No results for input(s): CHOL, HDL, LDLCALC, TRIG, CHOLHDL, LDLDIRECT in the last 72 hours. Thyroid Function Tests: No results for input(s): TSH, T4TOTAL, FREET4, T3FREE, THYROIDAB in the last 72 hours. Anemia Panel: No results for input(s): VITAMINB12, FOLATE, FERRITIN, TIBC, IRON, RETICCTPCT in the last 72 hours. Sepsis Labs: No results for input(s): PROCALCITON, LATICACIDVEN in the last 168 hours.  Recent Results  (from the past 240 hour(s))  Culture, Urine     Status: Abnormal   Collection Time: 05/02/17  1:53 PM  Result Value Ref Range Status   Specimen Description URINE, RANDOM  Final   Special Requests NONE  Final   Culture (A)  Final    >=100,000 COLONIES/mL KLEBSIELLA PNEUMONIAE >=100,000 COLONIES/mL ESCHERICHIA COLI    Report Status 05/05/2017 FINAL  Final   Organism ID, Bacteria KLEBSIELLA PNEUMONIAE (A)  Final   Organism ID, Bacteria ESCHERICHIA COLI (A)  Final      Susceptibility   Escherichia coli - MIC*    AMPICILLIN >=32 RESISTANT Resistant     CEFAZOLIN <=4 SENSITIVE Sensitive     CEFTRIAXONE <=1 SENSITIVE Sensitive  CIPROFLOXACIN >=4 RESISTANT Resistant     GENTAMICIN <=1 SENSITIVE Sensitive     IMIPENEM <=0.25 SENSITIVE Sensitive     NITROFURANTOIN <=16 SENSITIVE Sensitive     TRIMETH/SULFA >=320 RESISTANT Resistant     AMPICILLIN/SULBACTAM >=32 RESISTANT Resistant     PIP/TAZO <=4 SENSITIVE Sensitive     Extended ESBL NEGATIVE Sensitive     * >=100,000 COLONIES/mL ESCHERICHIA COLI   Klebsiella pneumoniae - MIC*    AMPICILLIN >=32 RESISTANT Resistant     CEFAZOLIN <=4 SENSITIVE Sensitive     CEFTRIAXONE <=1 SENSITIVE Sensitive     CIPROFLOXACIN <=0.25 SENSITIVE Sensitive     GENTAMICIN <=1 SENSITIVE Sensitive     IMIPENEM <=0.25 SENSITIVE Sensitive     NITROFURANTOIN 64 INTERMEDIATE Intermediate     TRIMETH/SULFA <=20 SENSITIVE Sensitive     AMPICILLIN/SULBACTAM 4 SENSITIVE Sensitive     PIP/TAZO <=4 SENSITIVE Sensitive     Extended ESBL NEGATIVE Sensitive     * >=100,000 COLONIES/mL KLEBSIELLA PNEUMONIAE         Radiology Studies: Dg Swallowing Func-speech Pathology  Result Date: 05/05/2017 Objective Swallowing Evaluation: Type of Study: MBS-Modified Barium Swallow Study Patient Details Name: Breyer Tejera MRN: [id number] Date of Birth: 1960/10/31 Today's Date: 05/05/2017 Time: SLP Start Time (ACUTE ONLY): 1350-SLP Stop Time (ACUTE ONLY): 1410 SLP Time  Calculation (min) (ACUTE ONLY): 20 min Past Medical History: No past medical history on file. Past Surgical History: Past Surgical History: Procedure Laterality Date . CRANIECTOMY Right 04/01/2017  Procedure: RIGHT DECOMPRESSIVE CRANIECTOMY;  Surgeon: Ditty, Kevan Ny, MD;  Location: [place];  Service: Neurosurgery;  Laterality: Right; . ESOPHAGOGASTRODUODENOSCOPY N/A 04/13/2017  Procedure: ESOPHAGOGASTRODUODENOSCOPY (EGD);  Surgeon: Georganna Skeans, MD;  Location: [place];  Service: General;  Laterality: N/A;  bedside . PEG PLACEMENT N/A 04/13/2017  Procedure: PERCUTANEOUS ENDOSCOPIC GASTROSTOMY (PEG) PLACEMENT;  Surgeon: Georganna Skeans, MD;  Location: [facility] ENDOSCOPY;  Service: General;  Laterality: N/A; HPI: 56 yo male brought to [facility] with altered mental status from DKA and sepsis. Found to have RT MCA CVA and transfer to [facility]. Required Rt decompressive craniectomy and remained on vent post op.ETT 8/23-9/5, s/p tracheostomy 9/5.  Subjective: pt eager for POs Assessment / Plan / Recommendation CHL IP CLINICAL IMPRESSIONS 05/05/2017 Clinical Impression  Pt demonstrates cognitive impairment impacting swallow function. Prolonged liangual rocking and pumping occurs with most boluses, particulalry purees, though a liquid wash is beneficial to facilitate oral transit. Swallow response is delayed reuslting in flash penetration events. Otherwise oropharyngeal mechanism is WNL. Pt may initaite a puree/thin diet though intake may be poor and assistance will be needed for attention with feeding and to avoid prolonged oral holding. PMSV to be in place with all PO. WIll f/u for tolerance.  SLP Visit Diagnosis Dysphagia, oropharyngeal phase (R13.12) Attention and concentration deficit following -- Frontal lobe and executive function deficit following -- Impact on safety and function --   CHL IP TREATMENT RECOMMENDATION 05/05/2017 Treatment Recommendations Therapy as outlined in treatment plan below   Prognosis 05/05/2017  Prognosis for Safe Diet Advancement Good Barriers to Reach Goals -- Barriers/Prognosis Comment -- CHL IP DIET RECOMMENDATION 05/05/2017 SLP Diet Recommendations Thin liquid;Dysphagia 1 (Puree) solids Liquid Administration via Cup;Straw Medication Administration Crushed with puree Compensations Minimize environmental distractions;Follow solids with liquid Postural Changes Seated upright at 90 degrees   CHL IP OTHER RECOMMENDATIONS 05/05/2017 Recommended Consults -- Oral Care Recommendations Other (Comment);Oral care BID Other Recommendations Have oral suction available;Place PMSV during PO intake   CHL IP FOLLOW UP RECOMMENDATIONS  05/05/2017 Follow up Recommendations Skilled Nursing facility   [facility] IP FREQUENCY AND DURATION 05/05/2017 Speech Therapy Frequency (ACUTE ONLY) min 2x/week Treatment Duration 2 weeks      CHL IP ORAL PHASE 05/05/2017 Oral Phase Impaired Oral - Pudding Teaspoon -- Oral - Pudding Cup -- Oral - Honey Teaspoon NT Oral - Honey Cup -- Oral - Nectar Teaspoon -- Oral - Nectar Cup -- Oral - Nectar Straw NT Oral - Thin Teaspoon NT Oral - Thin Cup -- Oral - Thin Straw Holding of bolus;Lingual pumping Oral - Puree Lingual pumping;Holding of bolus Oral - Mech Soft -- Oral - Regular Lingual pumping;Holding of bolus Oral - Multi-Consistency -- Oral - Pill -- Oral Phase - Comment --  CHL IP PHARYNGEAL PHASE 05/05/2017 Pharyngeal Phase Impaired Pharyngeal- Pudding Teaspoon -- Pharyngeal -- Pharyngeal- Pudding Cup -- Pharyngeal -- Pharyngeal- Honey Teaspoon NT Pharyngeal -- Pharyngeal- Honey Cup -- Pharyngeal -- Pharyngeal- Nectar Teaspoon -- Pharyngeal -- Pharyngeal- Nectar Cup -- Pharyngeal -- Pharyngeal- Nectar Straw NT Pharyngeal -- Pharyngeal- Thin Teaspoon NT Pharyngeal -- Pharyngeal- Thin Cup -- Pharyngeal -- Pharyngeal- Thin Straw Delayed swallow initiation-pyriform sinuses;Penetration/Aspiration during swallow Pharyngeal Material enters airway, remains ABOVE vocal cords then ejected out;Material does not  enter airway Pharyngeal- Puree Delayed swallow initiation-pyriform sinuses Pharyngeal -- Pharyngeal- Mechanical Soft Delayed swallow initiation-pyriform sinuses Pharyngeal -- Pharyngeal- Regular -- Pharyngeal -- Pharyngeal- Multi-consistency -- Pharyngeal -- Pharyngeal- Pill -- Pharyngeal -- Pharyngeal Comment --  CHL IP CERVICAL ESOPHAGEAL PHASE 04/28/2017 Cervical Esophageal Phase WFL Pudding Teaspoon -- Pudding Cup -- Honey Teaspoon -- Honey Cup -- Nectar Teaspoon -- Nectar Cup -- Nectar Straw -- Thin Teaspoon -- Thin Cup -- Thin Straw -- Puree -- Mechanical Soft -- Regular -- Multi-consistency -- Pill -- Cervical Esophageal Comment -- No flowsheet data found. DeBlois, Katherene Ponto 05/05/2017, 3:25 PM                   Scheduled Meds: . acetaminophen (TYLENOL) oral liquid 160 mg/5 mL  650 mg Per Tube TID  . aspirin  325 mg Per Tube Daily  . chlorhexidine  15 mL Mouth Rinse BID  . enoxaparin (LOVENOX) injection  40 mg Subcutaneous Q24H  . feeding supplement (PRO-STAT SUGAR FREE 64)  30 mL Per Tube BID  . free water  250 mL Per Tube Q8H  . gabapentin  300 mg Oral QHS  . glycopyrrolate  1 mg Per Tube BID  . insulin aspart  0-20 Units Subcutaneous Q4H  . insulin detemir  34 Units Subcutaneous Daily  . metoprolol tartrate  12.5 mg Per Tube BID  . pantoprazole sodium  40 mg Per Tube Daily   Continuous Infusions: . feeding supplement (GLUCERNA 1.2 CAL) 1,000 mL (05/05/17 1200)     LOS: 34 days    Time spent: 35 minutes.     Hosie Poisson, MD [facility] Pager [phone]   If 7PM-7AM, please contact night-coverage [email or web address] Password [facility] 05/05/2017, 4:32 PM

## 2017-05-05 NOTE — Progress Notes (Signed)
Paged [facility]: Patient now how diet, along with tube feeding. Inquired about changing Q4 CBGs to ACHS with appropriate sliding scale. Awaiting call back.

## 2017-05-06 ENCOUNTER — Inpatient Hospital Stay (HOSPITAL_COMMUNITY): Payer: Medicaid Other

## 2017-05-06 LAB — GLUCOSE, CAPILLARY
GLUCOSE-CAPILLARY: 115 mg/dL — AB (ref 65–99)
GLUCOSE-CAPILLARY: 102 mg/dL — AB (ref 65–99)
Glucose-Capillary: 150 mg/dL — ABNORMAL HIGH (ref 65–99)
Glucose-Capillary: 103 mg/dL — ABNORMAL HIGH (ref 65–99)

## 2017-05-06 NOTE — Progress Notes (Signed)
[facility] paged. Patient c/o stomach fullness, abdomen firm. Patient tube feed placed on pause during lunch, patient states "stomach does not hurt anymore.". Per Dr. Karleen [place] continue to hold tube feed in order to see if patient appettite increases and xray of abdomen ordered. Patient current diet order is dysphasia 1, thin liquid diet. RN will continue to monitor patient.

## 2017-05-06 NOTE — Progress Notes (Signed)
Dr Changed order from [facility] to [facility]

## 2017-05-06 NOTE — Progress Notes (Signed)
PROGRESS NOTE    Joe Wilkinson  SJG:[id number] DOB: July 03, 1961 DOA: 03/31/2017 PCP: Patient, No Pcp Per    Brief Narrative: 33 DM who was found unresponsive [place] bus stop, taken to [facility] found to be in DKA with sepsis from uncertain source. He was in acute respiratory failure and had to be intubated.  He had hemiparesis secondary to massive right MCA and PCA territory infarcts with mass effect. He was transferred to the ICU and underwent decompressive right craniectomy 8/25. Hypertonic saline protocol was completed. He required transfusion support and was treated for AFib with RVR, continued on the ventilator, eventually receiving trach 9/5 and PEG 9/6, on ATC 9/7. Patient does not have health insurance and because of his craniectomy without the ability to fit a helmet, most skilled nursing facilities areunwilling to accept him.  Currently he is getting meds thru PEG and continuous TF.  Heparin was converted to prophylaxis Lovenox. He was transferred from PCCm service to [facility] service on 9/25. Currently awaiting placement to snf.    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 Completed the treatment for strep agalactiae treatment. Continues to have secretions with severe coughing spells on talkinG. slp eval recommended starting dysphagia 1 diet.  Recommend pulmonary toilet.  Appreciate pulmonary assistance.    Septic shock from pneumonia:  Resolved.    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  Aspirin as per neurology.  Follow up with neuro surgery in 2 months. As outpatient.  His FOBT is negative, discussed with Dr Jaynee Eagles to see if he needs to be on anti coagulation, will touch base with Dr Leonie Man , who followed the patient earlier to see if he will benefit from NOAC vs aspirin 325mg  daily.    New onset a fib with RVR  Currently in sinus.  Not on anticoagulation as he was anemic requiring 4 units of prbc transfusion. On aspirin 325 mg daily.  Resume metoprolol for rate control.    Diabetes mellitus:  CBG (last 3)   Recent Labs  05/06/17 0631 05/06/17 1208 05/06/17 1734  GLUCAP 115* 150* 102*    Resume SSI. And levemir daily.  No change in medications.    Anemia of blood loss of unclear source.  abd X ray  does not show any bleeding. FOBT is pending. Hemoglobin appears to have been stabilized between 10 to 11.  E coli and klebsiella in urine, since he cannot verbalize urinary symptoms would treat him with keflex for 7 days.   Nutrition:  He has been on tube feeds till now, since SLP recommended dysphagia 1 diet, we will add it , RN reports some abd discomfort and distention. Will hold the tube feeds for one day and probably resume it in am.  axr ordered to evaluate for obstruction.    DVT prophylaxis:lovenox. Code Status: full code.  Family Communication: none at bedside.  Disposition Plan: awaiting placement at SNF.    Consultants:   PCCM  Procedures: none.  Antimicrobials:none.   Subjective: Had some neck discomfort last night. None today.    Objective: Vitals:   05/06/17 1130 05/06/17 1153 05/06/17 1500 05/06/17 1615  BP: 98/61  121/90   Pulse: (!) 106 (!) 107 93 (!) 102  Resp:  19  20  Temp:      TempSrc:      SpO2:  98% 99% 99%  Weight:      Height:        Intake/Output Summary (Last 24 hours) at 05/06/17 1739 Last data filed at 05/06/17 0558  Gross per 24  hour  Intake               60 ml  Output             1100 ml  Net            -1040 ml   Filed Weights   04/25/17 0412 04/26/17 0416 04/26/17 2055  Weight: 88.4 kg (194 lb 14.2 oz) 85.5 kg (188 lb 7.9 oz) 88.7 kg (195 lb 8.8 oz)    Examination:   General exam: Appears calm and comfortable ATC IN PLACE , not in distress.  Respiratory system:  Scattered rhonchi, no wheezing heard. Air entry fair.  Cardiovascular system: S1 & S2 heard, RRR. No JVD,  No pedal edema. Gastrointestinal system: Abdomen is soft non tender non distended bowel sounds heard.  Central nervous system: Alert and able to move all extremities. Responding to commands.  Extremities: no cyanosis or clubbing. No pedal edema.  Skin: No rashes, lesions or ulcers Psychiatry: mood appropriate.     Data Reviewed: I have personally reviewed following labs and imaging studies  CBC:  Recent Labs Lab 05/02/17 0527 05/04/17 0814  WBC 13.4* 17.2*  HGB 10.6* 11.5*  HCT 36.6* 39.2  MCV 95.8 96.3  PLT 308 332   Basic Metabolic Panel:  Recent Labs Lab 05/02/17 0527  NA 138  K 4.3  CL 101  CO2 30  GLUCOSE 142*  BUN 31*  CREATININE 0.77  CALCIUM 10.3   GFR: Estimated Creatinine Clearance: 115.6 mL/min (by C-G formula based on SCr of 0.77 mg/dL). Liver Function Tests:  Recent Labs Lab 05/02/17 0527  AST 19  ALT 20  ALKPHOS 60  BILITOT 0.2*  PROT 7.2  ALBUMIN 2.5*    Recent Labs Lab 05/02/17 0527  LIPASE 33   No results for input(s): AMMONIA in the last 168 hours. Coagulation Profile: No results for input(s): INR, PROTIME in the last 168 hours. Cardiac Enzymes: No results for input(s): CKTOTAL, CKMB, CKMBINDEX, TROPONINI in the last 168 hours. BNP (last 3 results) No results for input(s): PROBNP in the last 8760 hours. HbA1C: No results for input(s): HGBA1C in the last 72 hours. CBG:  Recent Labs Lab 05/05/17 1630 05/05/17 1950 05/06/17 0631 05/06/17 1208 05/06/17 1734  GLUCAP 113*  125* 115* 150* 102*   Lipid Profile: No results for input(s): CHOL, HDL, LDLCALC, TRIG, CHOLHDL, LDLDIRECT in the last 72 hours. Thyroid Function Tests: No results for input(s): TSH, T4TOTAL, FREET4, T3FREE, THYROIDAB in the last 72 hours. Anemia Panel: No results for input(s): VITAMINB12, FOLATE, FERRITIN, TIBC, IRON, RETICCTPCT in the last 72 hours. Sepsis Labs: No results for input(s): PROCALCITON, LATICACIDVEN in the last 168 hours.  Recent Results (from the past 240 hour(s))  Culture, Urine     Status: Abnormal   Collection Time: 05/02/17  1:53 PM  Result Value Ref Range Status   Specimen Description URINE,  RANDOM  Final   Special Requests NONE  Final   Culture (A)  Final    >=100,000 COLONIES/mL KLEBSIELLA PNEUMONIAE >=100,000 COLONIES/mL ESCHERICHIA COLI    Report Status 05/05/2017 FINAL  Final   Organism ID, Bacteria KLEBSIELLA PNEUMONIAE (A)  Final   Organism ID, Bacteria ESCHERICHIA COLI (A)  Final      Susceptibility   Escherichia coli - MIC*    AMPICILLIN >=32 RESISTANT Resistant     CEFAZOLIN <=4 SENSITIVE Sensitive     CEFTRIAXONE <=1 SENSITIVE Sensitive     CIPROFLOXACIN >=4 RESISTANT Resistant     GENTAMICIN <=1 SENSITIVE Sensitive     IMIPENEM <=0.25 SENSITIVE Sensitive     NITROFURANTOIN <=16 SENSITIVE Sensitive     TRIMETH/SULFA >=320 RESISTANT Resistant     AMPICILLIN/SULBACTAM >=32 RESISTANT Resistant     PIP/TAZO <=4 SENSITIVE Sensitive     Extended ESBL NEGATIVE Sensitive     * >=100,000 COLONIES/mL ESCHERICHIA COLI   Klebsiella pneumoniae - MIC*    AMPICILLIN >=32 RESISTANT Resistant     CEFAZOLIN <=4 SENSITIVE Sensitive     CEFTRIAXONE <=1 SENSITIVE Sensitive     CIPROFLOXACIN <=0.25 SENSITIVE Sensitive     GENTAMICIN <=1 SENSITIVE Sensitive     IMIPENEM <=0.25 SENSITIVE Sensitive     NITROFURANTOIN 64 INTERMEDIATE Intermediate     TRIMETH/SULFA <=20 SENSITIVE Sensitive     AMPICILLIN/SULBACTAM 4 SENSITIVE Sensitive     PIP/TAZO <=4 SENSITIVE  Sensitive     Extended ESBL NEGATIVE Sensitive     * >=100,000 COLONIES/mL KLEBSIELLA PNEUMONIAE         Radiology Studies: Dg Chest Port 1 View  Result Date: 05/05/2017 CLINICAL DATA:  Neck pain EXAM: PORTABLE CHEST 1 VIEW COMPARISON:  Chest radiograph 04/30/2017 FINDINGS: Tracheostomy tube tip is at the level of the clavicular heads. Cardiomediastinal contours are unchanged. No focal airspace consolidation or pulmonary edema. No pneumothorax. Unchanged probable left pleural effusion and associated atelectasis. IMPRESSION: Unchanged small left pleural effusion and associated atelectasis. Electronically Signed   By: Ulyses Jarred M.D.   On: 05/05/2017 20:58   Dg Swallowing Func-speech Pathology  Result Date: 05/05/2017 Objective Swallowing Evaluation: Type of Study: MBS-Modified Barium Swallow Study Patient Details Name: Rishawn Walck MRN: [id number] Date of Birth: 05/02/1961 Today's Date: 05/05/2017 Time: SLP Start Time (ACUTE ONLY): 1350-SLP Stop Time (ACUTE ONLY): 1410 SLP Time Calculation (min) (ACUTE ONLY): 20 min Past Medical History: No past medical history on file. Past Surgical History: Past Surgical History: Procedure Laterality Date . CRANIECTOMY Right 04/01/2017  Procedure: RIGHT DECOMPRESSIVE CRANIECTOMY;  Surgeon: Ditty, Kevan Ny, MD;  Location: [place];  Service: Neurosurgery;  Laterality: Right; . ESOPHAGOGASTRODUODENOSCOPY N/A 04/13/2017  Procedure: ESOPHAGOGASTRODUODENOSCOPY (EGD);  Surgeon: Georganna Skeans, MD;  Location: [place];  Service: General;  Laterality: N/A;  bedside . PEG PLACEMENT N/A 04/13/2017  Procedure: PERCUTANEOUS ENDOSCOPIC GASTROSTOMY (PEG) PLACEMENT;  Surgeon: Georganna Skeans, MD;  Location: [facility] ENDOSCOPY;  Service: General;  Laterality: N/A; HPI: 56 yo male brought to [facility] with altered mental status from DKA and sepsis. Found to have RT MCA CVA and transfer to [facility]. Required Rt decompressive craniectomy and remained on vent post op.ETT 8/23-9/5, s/p  tracheostomy 9/5.  Subjective: pt eager for POs Assessment / Plan / Recommendation CHL IP CLINICAL IMPRESSIONS 05/05/2017 Clinical Impression  Pt demonstrates cognitive impairment impacting swallow function. Prolonged liangual rocking and pumping occurs with most boluses, particulalry purees, though a liquid wash is beneficial to facilitate oral transit. Swallow response is delayed reuslting in flash  penetration events. Otherwise oropharyngeal mechanism is WNL. Pt may initaite a puree/thin diet though intake may be poor and assistance will be needed for attention with feeding and to avoid prolonged oral holding. PMSV to be in place with all PO. WIll f/u for tolerance.  SLP Visit Diagnosis Dysphagia, oropharyngeal phase (R13.12) Attention and concentration deficit following -- Frontal lobe and executive function deficit following -- Impact on safety and function --   CHL IP TREATMENT RECOMMENDATION 05/05/2017 Treatment Recommendations Therapy as outlined in treatment plan below   Prognosis 05/05/2017 Prognosis for Safe Diet Advancement Good Barriers to Reach Goals -- Barriers/Prognosis Comment -- CHL IP DIET RECOMMENDATION 05/05/2017 SLP Diet Recommendations Thin liquid;Dysphagia 1 (Puree) solids Liquid Administration via Cup;Straw Medication Administration Crushed with puree Compensations Minimize environmental distractions;Follow solids with liquid Postural Changes Seated upright at 90 degrees   CHL IP OTHER RECOMMENDATIONS 05/05/2017 Recommended Consults -- Oral Care Recommendations Other (Comment);Oral care BID Other Recommendations Have oral suction available;Place PMSV during PO intake   CHL IP FOLLOW UP RECOMMENDATIONS 05/05/2017 Follow up Recommendations Skilled Nursing facility   [facility] IP FREQUENCY AND DURATION 05/05/2017 Speech Therapy Frequency (ACUTE ONLY) min 2x/week Treatment Duration 2 weeks      CHL IP ORAL PHASE 05/05/2017 Oral Phase Impaired Oral - Pudding Teaspoon -- Oral - Pudding Cup -- Oral - Honey  Teaspoon NT Oral - Honey Cup -- Oral - Nectar Teaspoon -- Oral - Nectar Cup -- Oral - Nectar Straw NT Oral - Thin Teaspoon NT Oral - Thin Cup -- Oral - Thin Straw Holding of bolus;Lingual pumping Oral - Puree Lingual pumping;Holding of bolus Oral - Mech Soft -- Oral - Regular Lingual pumping;Holding of bolus Oral - Multi-Consistency -- Oral - Pill -- Oral Phase - Comment --  CHL IP PHARYNGEAL PHASE 05/05/2017 Pharyngeal Phase Impaired Pharyngeal- Pudding Teaspoon -- Pharyngeal -- Pharyngeal- Pudding Cup -- Pharyngeal -- Pharyngeal- Honey Teaspoon NT Pharyngeal -- Pharyngeal- Honey Cup -- Pharyngeal -- Pharyngeal- Nectar Teaspoon -- Pharyngeal -- Pharyngeal- Nectar Cup -- Pharyngeal -- Pharyngeal- Nectar Straw NT Pharyngeal -- Pharyngeal- Thin Teaspoon NT Pharyngeal -- Pharyngeal- Thin Cup -- Pharyngeal -- Pharyngeal- Thin Straw Delayed swallow initiation-pyriform sinuses;Penetration/Aspiration during swallow Pharyngeal Material enters airway, remains ABOVE vocal cords then ejected out;Material does not enter airway Pharyngeal- Puree Delayed swallow initiation-pyriform sinuses Pharyngeal -- Pharyngeal- Mechanical Soft Delayed swallow initiation-pyriform sinuses Pharyngeal -- Pharyngeal- Regular -- Pharyngeal -- Pharyngeal- Multi-consistency -- Pharyngeal -- Pharyngeal- Pill -- Pharyngeal -- Pharyngeal Comment --  CHL IP CERVICAL ESOPHAGEAL PHASE 04/28/2017 Cervical Esophageal Phase WFL Pudding Teaspoon -- Pudding Cup -- Honey Teaspoon -- Honey Cup -- Nectar Teaspoon -- Nectar Cup -- Nectar Straw -- Thin Teaspoon -- Thin Cup -- Thin Straw -- Puree -- Mechanical Soft -- Regular -- Multi-consistency -- Pill -- Cervical Esophageal Comment -- No flowsheet data found. DeBlois, Katherene Ponto 05/05/2017, 3:25 PM                   Scheduled Meds: . acetaminophen (TYLENOL) oral liquid 160 mg/5 mL  650 mg Per Tube TID  . aspirin  325 mg Per Tube Daily  . cephALEXin  500 mg Oral Q12H  . chlorhexidine  15 mL Mouth Rinse  BID  . enoxaparin (LOVENOX) injection  40 mg Subcutaneous Q24H  . feeding supplement (PRO-STAT SUGAR FREE 64)  30 mL Per Tube BID  . free water  250 mL Per Tube Q8H  . gabapentin  300 mg Oral QHS  . glycopyrrolate  1 mg Per  Tube BID  . insulin aspart  0-20 Units Subcutaneous TID WC  . insulin aspart  0-5 Units Subcutaneous QHS  . insulin detemir  34 Units Subcutaneous Daily  . metoprolol tartrate  12.5 mg Per Tube BID  . pantoprazole sodium  40 mg Per Tube Daily   Continuous Infusions: . feeding supplement (GLUCERNA 1.2 CAL) Stopped (05/06/17 1210)     LOS: 35 days    Time spent: 35 minutes.     Hosie Poisson, MD [facility] Pager [phone]   If 7PM-7AM, please contact night-coverage [email or web address] Password [facility] 05/06/2017, 5:39 PM

## 2017-05-06 NOTE — Progress Notes (Signed)
Received bedside report from Mardene Celeste, [place].  Patient lying supine with head turned to the right. After some coaxing, he did turn to the left and looked at me. Denies Pain. Trach clean. 28% RA, Will monitor patient. Safety maintained.

## 2017-05-07 LAB — CBC
HEMATOCRIT: 37.5 % — AB (ref 39.0–52.0)
HEMOGLOBIN: 11.1 g/dL — AB (ref 13.0–17.0)
MCH: 28 pg (ref 26.0–34.0)
MCHC: 29.6 g/dL — ABNORMAL LOW (ref 30.0–36.0)
MCV: 94.7 fL (ref 78.0–100.0)
Platelets: 192 10*3/uL (ref 150–400)
RBC: 3.96 MIL/uL — ABNORMAL LOW (ref 4.22–5.81)
RDW: 16.4 % — ABNORMAL HIGH (ref 11.5–15.5)
WBC: 13.1 10*3/uL — ABNORMAL HIGH (ref 4.0–10.5)

## 2017-05-07 LAB — BASIC METABOLIC PANEL
Anion gap: 9 (ref 5–15)
BUN: 26 mg/dL — AB (ref 6–20)
CHLORIDE: 102 mmol/L (ref 101–111)
CO2: 24 mmol/L (ref 22–32)
Calcium: 10.4 mg/dL — ABNORMAL HIGH (ref 8.9–10.3)
Creatinine, Ser: 0.8 mg/dL (ref 0.61–1.24)
GFR calc Af Amer: >60 mL/min (ref >60)
GFR calc non Af Amer: >60 mL/min (ref >60)
GLUCOSE: 97 mg/dL (ref 65–99)
POTASSIUM: 4.5 mmol/L (ref 3.5–5.1)
Sodium: 135 mmol/L (ref 135–145)

## 2017-05-07 LAB — GLUCOSE, CAPILLARY
GLUCOSE-CAPILLARY: 174 mg/dL — AB (ref 65–99)
GLUCOSE-CAPILLARY: 119 mg/dL — AB (ref 65–99)
GLUCOSE-CAPILLARY: 133 mg/dL — AB (ref 65–99)
Glucose-Capillary: 101 mg/dL — ABNORMAL HIGH (ref 65–99)

## 2017-05-07 NOTE — Progress Notes (Signed)
PROGRESS NOTE    Joe Wilkinson  YQM:[id number] DOB: 27-Sep-1960 DOA: 03/31/2017 PCP: Patient, No Pcp Per    Brief Narrative: 88 DM who was [place] un[place] bus stop, taken to [facility] found to be in DKA with sepsis from uncertain source. He was in acute respiratory failure and had to be intubated.  He had hemiparesis secondary to massive right MCA and PCA territory infarcts with mass effect. He was transferred to the ICU and underwent decompressive right craniectomy 8/25. Hypertonic saline protocol was completed. He required transfusion support and was treated for AFib with RVR, continued on the ventilator, eventually receiving trach 9/5 and PEG 9/6, on ATC 9/7. Patient does not have health insurance and because of his craniectomy without the ability to fit a helmet, most skilled nursing facilities areunwilling to accept him.  Currently he is getting meds thru PEG and continuous TF.  Heparin was converted to prophylaxis Lovenox. He was transferred from PCCm service to [facility] service on 9/25. Currently awaiting placement to snf.    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 Completed the treatment for strep agalactiae treatment. Continues to have secretions with severe coughing spells on talkinG. slp eval recommended starting dysphagia 1 diet. Restarted tube feeds as he has minimal po intake. Recommend pulmonary toilet.  Appreciate pulmonary assistance.    Septic shock from pneumonia:  Resolved.    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  Aspirin as per neurology.  Follow up with neuro surgery in 2 months. As outpatient.  His FOBT is negative, discussed with Dr Jaynee Eagles to see if he needs to be on anti coagulation, will touch base with Dr Leonie Man on monday, who followed the patient earlier to see if he will benefit from NOAC vs aspirin 325mg  daily.    New onset a fib with RVR  Currently in sinus.  Not on anticoagulation as he was anemic requiring 4 units of prbc transfusion. On aspirin 325 mg daily.  Resume metoprolol for rate control.    Diabetes mellitus:  CBG (last 3)   Recent Labs  05/06/17 2118 05/07/17 0604 05/07/17 1143  GLUCAP 103* 101* 174*    Resume SSI. And levemir daily.  No change in medications.    Anemia of blood loss of unclear source.  abd X ray  does not show any bleeding. FOBT is pending. Hemoglobin appears to have been stabilized between 10 to 11.  E coli and klebsiella in urine, since he cannot verbalize urinary symptoms would treat him with keflex for 7 days.   Nutrition:  He has been on tube feeds till now, since SLP recommended dysphagia 1 diet, abd distention on 9/29 , follow up with abd film doesn ot show any ileus or obstruction.  Initially held the tube feeds, to continue them as there is no obstruction.   DVT prophylaxis:lovenox. Code Status: full code.  Family Communication: none at bedside. Called sister over the phone, could not leave  message.  Disposition Plan: awaiting placement at SNF.    Consultants:   PCCM   Procedures: none.  Antimicrobials:none.   Subjective: No new complaints, has breakfast tray untouched by the bedside.    Objective: Vitals:   05/07/17 1100 05/07/17 1209 05/07/17 1427 05/07/17 1558  BP:   124/74   Pulse: 92 92 90 (!) 104  Resp: 18 18 18 20   Temp:   98.7 F (37.1 C)   TempSrc:   Oral   SpO2:  100% 100% 100%    Weight:      Height:        Intake/Output Summary (Last 24 hours) at 05/07/17 1636 Last data filed at 05/07/17 1200  Gross per 24 hour  Intake              120 ml  Output              700 ml  Net             -580 ml   Filed Weights   04/25/17 0412 04/26/17 0416 04/26/17 2055  Weight: 88.4 kg (194 lb 14.2 oz) 85.5 kg (188 lb 7.9 oz) 88.7 kg (195 lb 8.8 oz)    Examination:   General exam: Appears calm and comfortable ATC IN PLACE , not in distress.  Respiratory system:  coarse breath sounds, no wheezing, air entry fair.  Cardiovascular system: S1 & S2 heard, RRR. No JVD.  Gastrointestinal system: Abdomen is soft NT ND BS+ , g tube in place.  Central nervous system: Alert and able to move all extremities. Responding to commands.  Extremities: no cyanosis or clubbing. No pedal edema.  Skin: No rashes, lesions or ulcers Psychiatry: mood appropriate.     Data Reviewed: I have personally reviewed following labs and imaging studies  CBC:  Recent Labs Lab 05/02/17 0527 05/04/17 0814 05/07/17 0017  WBC 13.4* 17.2* 13.1*  HGB 10.6* 11.5* 11.1*  HCT 36.6* 39.2 37.5*  MCV 95.8 96.3 94.7  PLT 308 276 517   Basic Metabolic Panel:  Recent Labs Lab 05/02/17 0527 05/07/17 0017  NA 138 135  K 4.3 4.5  CL 101 102  CO2 30 24  GLUCOSE 142* 97  BUN 31* 26*  CREATININE 0.77 0.80  CALCIUM 10.3 10.4*   GFR: Estimated Creatinine Clearance: 115.6 mL/min (by C-G formula based on SCr of 0.8 mg/dL). Liver Function Tests:  Recent Labs Lab 05/02/17 0527  AST 19  ALT 20  ALKPHOS 60  BILITOT 0.2*  PROT 7.2  ALBUMIN 2.5*    Recent Labs Lab 05/02/17 0527  LIPASE 33   No results for input(s): AMMONIA in the last 168 hours. Coagulation Profile: No results for input(s): INR, PROTIME in the last 168 hours. Cardiac Enzymes: No results for input(s): CKTOTAL, CKMB, CKMBINDEX, TROPONINI in the last 168 hours. BNP (last 3 results) No results for input(s): PROBNP in the last  8760 hours. HbA1C: No results for input(s): HGBA1C in the last 72 hours. CBG:  Recent Labs Lab 05/06/17 1208 05/06/17 1734 05/06/17 2118 05/07/17 0604 05/07/17 1143  GLUCAP 150* 102* 103* 101* 174*   Lipid Profile: No results for input(s): CHOL, HDL, LDLCALC, TRIG, CHOLHDL, LDLDIRECT in the last 72 hours. Thyroid Function Tests: No results for input(s): TSH, T4TOTAL, FREET4, T3FREE, THYROIDAB in the last 72 hours. Anemia Panel: No results for input(s): VITAMINB12, FOLATE, FERRITIN, TIBC, IRON, RETICCTPCT in the last 72 hours. Sepsis Labs: No results for input(s): PROCALCITON, LATICACIDVEN in the last 168  hours.  Recent Results (from the past 240 hour(s))  Culture, Urine     Status: Abnormal   Collection Time: 05/02/17  1:53 PM  Result Value Ref Range Status   Specimen Description URINE, RANDOM  Final   Special Requests NONE  Final   Culture (A)  Final    >=100,000 COLONIES/mL KLEBSIELLA PNEUMONIAE >=100,000 COLONIES/mL ESCHERICHIA COLI    Report Status 05/05/2017 FINAL  Final   Organism ID, Bacteria KLEBSIELLA PNEUMONIAE (A)  Final   Organism ID, Bacteria ESCHERICHIA COLI (A)  Final      Susceptibility   Escherichia coli - MIC*    AMPICILLIN >=32 RESISTANT Resistant     CEFAZOLIN <=4 SENSITIVE Sensitive     CEFTRIAXONE <=1 SENSITIVE Sensitive     CIPROFLOXACIN >=4 RESISTANT Resistant     GENTAMICIN <=1 SENSITIVE Sensitive     IMIPENEM <=0.25 SENSITIVE Sensitive     NITROFURANTOIN <=16 SENSITIVE Sensitive     TRIMETH/SULFA >=320 RESISTANT Resistant     AMPICILLIN/SULBACTAM >=32 RESISTANT Resistant     PIP/TAZO <=4 SENSITIVE Sensitive     Extended ESBL NEGATIVE Sensitive     * >=100,000 COLONIES/mL ESCHERICHIA COLI   Klebsiella pneumoniae - MIC*    AMPICILLIN >=32 RESISTANT Resistant     CEFAZOLIN <=4 SENSITIVE Sensitive     CEFTRIAXONE <=1 SENSITIVE Sensitive     CIPROFLOXACIN <=0.25 SENSITIVE Sensitive     GENTAMICIN <=1 SENSITIVE Sensitive     IMIPENEM <=0.25  SENSITIVE Sensitive     NITROFURANTOIN 64 INTERMEDIATE Intermediate     TRIMETH/SULFA <=20 SENSITIVE Sensitive     AMPICILLIN/SULBACTAM 4 SENSITIVE Sensitive     PIP/TAZO <=4 SENSITIVE Sensitive     Extended ESBL NEGATIVE Sensitive     * >=100,000 COLONIES/mL KLEBSIELLA PNEUMONIAE         Radiology Studies: Dg Chest Port 1 View  Result Date: 05/05/2017 CLINICAL DATA:  Neck pain EXAM: PORTABLE CHEST 1 VIEW COMPARISON:  Chest radiograph 04/30/2017 FINDINGS: Tracheostomy tube tip is at the level of the clavicular heads. Cardiomediastinal contours are unchanged. No focal airspace consolidation or pulmonary edema. No pneumothorax. Unchanged probable left pleural effusion and associated atelectasis. IMPRESSION: Unchanged small left pleural effusion and associated atelectasis. Electronically Signed   By: Ulyses Jarred M.D.   On: 05/05/2017 20:58   Dg Abd 2 Views  Result Date: 05/06/2017 CLINICAL DATA:  Abdominal distension. EXAM: ABDOMEN - 2 VIEW COMPARISON:  May 02, 2017. FINDINGS: No abnormal bowel dilatation is noted. Gastrostomy tube is seen projected over gastric air bubble. Residual contrast is seen in the colon. No abnormal calcifications are noted. IMPRESSION: No evidence of bowel obstruction or ileus. Electronically Signed   By: Marijo Conception, M.D.   On: 05/06/2017 21:02        Scheduled Meds: . acetaminophen (TYLENOL) oral liquid 160 mg/5 mL  650 mg Per Tube TID  . aspirin  325 mg Per Tube Daily  . cephALEXin  500 mg Oral Q12H  . chlorhexidine  15 mL Mouth Rinse BID  . enoxaparin (LOVENOX) injection  40 mg Subcutaneous Q24H  . feeding supplement (PRO-STAT SUGAR FREE 64)  30 mL Per Tube BID  . free water  250 mL Per Tube Q8H  . gabapentin  300 mg Oral QHS  . glycopyrrolate  1 mg Per Tube BID  . insulin aspart  0-20 Units Subcutaneous TID WC  . insulin aspart  0-5 Units Subcutaneous QHS  . insulin detemir  34 Units Subcutaneous Daily  .  metoprolol tartrate  12.5 mg  Per Tube BID  . pantoprazole sodium  40 mg Per Tube Daily   Continuous Infusions: . feeding supplement (GLUCERNA 1.2 CAL) 1,000 mL (05/07/17 0440)     LOS: 36 days    Time spent: 35 minutes.     Hosie Poisson, MD [facility] Pager [phone]   If 7PM-7AM, please contact night-coverage [email or web address] Password [email or web address] 05/07/2017, 4:36 PM

## 2017-05-08 ENCOUNTER — Inpatient Hospital Stay (HOSPITAL_COMMUNITY): Payer: Medicaid Other

## 2017-05-08 DIAGNOSIS — J9600 Acute respiratory failure, unspecified whether with hypoxia or hypercapnia: Secondary | ICD-10-CM

## 2017-05-08 LAB — GLUCOSE, CAPILLARY
Glucose-Capillary: 133 mg/dL — ABNORMAL HIGH (ref 65–99)
Glucose-Capillary: 146 mg/dL — ABNORMAL HIGH (ref 65–99)
Glucose-Capillary: 118 mg/dL — ABNORMAL HIGH (ref 65–99)
Glucose-Capillary: 111 mg/dL — ABNORMAL HIGH (ref 65–99)

## 2017-05-08 NOTE — Progress Notes (Signed)
PULMONARY / CRITICAL CARE MEDICINE   Name: Joe Wilkinson MRN: [id number] DOB: [birth year]    ADMISSION DATE:  03/31/2017 CHIEF COMPLAINT:  CVA  HISTORY OF PRESENT ILLNESS:   56 yo male brought to [facility] with altered mental status from DKA and sepsis.  Found to have RT MCA CVA and transfer to [facility].  Required Rt decompressive craniectomy and remained on vent post op.  SUBJECTIVE:  Afebrile.  Tolerating PMV, 28% ATC.  VITAL SIGNS: BP 134/83 (BP Location: Left Arm)   Pulse 92   Temp 98.7 F (37.1 C) (Oral)   Resp 20   Ht 5\' 10"  (1.778 m)   Wt 195 lb 8.8 oz (88.7 kg)   SpO2 98%   BMI 28.06 kg/m   VENTILATOR SETTINGS: FiO2 (%):  [28 %] 28 %  INTAKE / OUTPUT: I/O last 3 completed shifts: In: 120 [P.O.:120] Out: 1250 [Urine:1250]  PHYSICAL EXAMINATION:  General: chronically ill appearing male in NAD HEENT: MM pink/moist, trach site clean/dry/intact, tolerating PMV, voice strong Neuro: Awake/alert, interactive  CV: s1s2 rrr, no m/r/g PULM: even/non-labored, lungs bilaterally scattered rhonchi  BD:ZHGD, non-tender, bsx4 active  Extremities: warm/dry, no edema  Skin: no rashes or lesions   BMET  Recent Labs Lab 05/02/17 0527 05/07/17 0017  NA 138 135  K 4.3 4.5  CL 101 102  CO2 30 24  BUN 31* 26*  CREATININE 0.77 0.80  GLUCOSE 142* 97    Electrolytes  Recent Labs Lab 05/02/17 0527 05/07/17 0017  CALCIUM 10.3 10.4*    CBC  Recent Labs Lab 05/02/17 0527 05/04/17 0814 05/07/17 0017  WBC 13.4* 17.2* 13.1*  HGB 10.6* 11.5* 11.1*  HCT 36.6* 39.2 37.5*  PLT 308 276 192    Coag's No results for input(s): APTT, INR in the last 168 hours.  Sepsis Markers No results for input(s): LATICACIDVEN, PROCALCITON, O2SATVEN in the last 168 hours.  ABG No results for input(s): PHART, PCO2ART, PO2ART in the last 168 hours.  Liver Enzymes  Recent Labs Lab 05/02/17 0527  AST 19  ALT 20  ALKPHOS 60  BILITOT 0.2*  ALBUMIN 2.5*    Cardiac Enzymes No results  for input(s): TROPONINI, PROBNP in the last 168 hours.  Glucose  Recent Labs Lab 05/06/17 2118 05/07/17 0604 05/07/17 1143 05/07/17 1652 05/07/17 2228 05/08/17 0735  GLUCAP 103* 101* 174* 119* 133* 133*    Imaging No results found.  STUDIES:  LP 8/24 >> glucose 205, RBC 16, protein 62, WBC 5 CT head 8/24 >> Large Rt MCA EEG 8/27 >> bilateral cerebral dysfunction, non-specific in etiology Echo 8/27 >> EF 65 to 70% CT abd/pelvis 8/30 >> third spacing, 3.5 cm low attenuation Lt hepatic lobe CT Head 9/3>> Severe right hemisphere cytotoxic edema with stable extension of edematous brain through the right craniectomy defect. Stable petechial hemorrhage without malignant hemorrhagicTransformation. Stable intracranial mass effect with leftward midline shift of 7 mm. Small left PCA territory infarct. CXR 9/23 > LLL atx and small effusion Barrium swallow 9/21 > severe oral and mod pharyngeal dysphagia; mod to severe aspiration risk  CULTURES: CSF 8/24>> negative BCX 8/24>> negative UCX 8/24>> negative Sputum 8/24>> S. Agalactiae Sputum 9/1>> Consistent with normal resp. [place] UC 9/25 >> klebsiella (S-rocephin, imipenem), Ecoli (S- rocephin, imipenem)  ANTIBIOTICS: vanco 8/24 >> 8/26 Zosyn 8/24 >> 8/26, 9/1 >>off  Cleocin 9/18>>> Ampicillin 8/27 > 8/30  SIGNIFICANT EVENTS: 8/23 Admit [facility] 8/23 8/24 Transfer to [facility] after Rt MCA CVA, start 3% NS 8/25 Decompressive craniectomy 8/27 Transfuse  1 unit PRBC 8/28 Transfuse 1 unit PRBC, A fib with RVR 8/30 Transfuse 1 unit PRBC 8/31 Off pressors, reintubated x 2, transfuse PRBC 9/5 Trach 9/6 PEG 9/14 Trach exchanged from 8 cuffed to 6 cuffless 9/22 Trach exchanged to 4 cuffless 10/1 Tolerating PMV, ATC 28%  LINES/TUBES: ETT 8/23 >>9/5 Trach [name]) 9/5>>> Lt IJ CVL 8/23 >>removed PEG 9/6 >  ASSESSMENT / PLAN:  Acute on chronic hypoxemic respiratory failure s/p trach  Small pleural effusion- improved Plan: ATC as tolerated   Wean O2 for sats > 90% Pulmonary hygiene - mobilize  PMV as tolerated  Even balance Will see again on 10/4 and review for possible decannulation  Await SNF placement   Dysphagia - barrium swallow 9/21 with severe oral and mod pharyngeal dysphagia; mod to severe aspiration risk Plan: TF per Nutrition  NPO  SLP following    PCCM will follow for trach needs.  Will see again 10/4   Noe Gens, NP-C [facility] Pgr: [phone] or if no answer [phone] 05/08/2017, 10:42 AM

## 2017-05-08 NOTE — Progress Notes (Signed)
  Speech Language Pathology Treatment: Dysphagia;Cognitive-Linquistic  Patient Details Name: Joe Wilkinson MRN: [id number] DOB: 1961-04-17 Today's Date: 05/08/2017 Time: 6948-5462 SLP Time Calculation (min) (ACUTE ONLY): 25 min  Assessment / Plan / Recommendation Clinical Impression  Skilled treatment session focused on dysphagia and cognition goals. SLP facilitated session by placing PMSV for ~ 20 minutes without any decline in vitals. Pt able to assist with oral care and Mod A cues (tactile, verbal). Pt with increased sustained attention and interest in self-feeding. Pt consumed puree with thin liquids with increased oral clearing and no overt s/s of aspiration with thin liquids via cup. Pt able to hold cup and bring to lips with Mod A to Min A cues. Spoke with nursing regarding decreased TF during day since pt has increased PO intake. Pt with good progress over last reporting period. Continue to follow.    HPI HPI: 56 yo male brought to [facility] with altered mental status from DKA and sepsis. Found to have RT MCA CVA and transfer to [facility]. Required Rt decompressive craniectomy and remained on vent post op.ETT 8/23-9/5, s/p tracheostomy 9/5.       SLP Plan  Continue with current plan of care       Recommendations  Diet recommendations: Dysphagia 1 (puree);Thin liquid Liquids provided via: Cup Medication Administration: Via alternative means Supervision: Staff to assist with self feeding;Full supervision/cueing for compensatory strategies Compensations: Minimize environmental distractions;Follow solids with liquid Postural Changes and/or Swallow Maneuvers: Seated upright 90 degrees      Patient may use Passy-Muir Speech Valve: Intermittently with supervision;During all therapies with supervision;During PO intake/meals PMSV Supervision: Full         Oral Care Recommendations: Oral care BID Follow up Recommendations: Skilled Nursing facility SLP Visit Diagnosis: Dysphagia, oropharyngeal  phase (R13.12) Plan: Continue with current plan of care       GO                Kyri Shader 05/08/2017, 1:35 PM

## 2017-05-08 NOTE — Progress Notes (Signed)
Occupational Therapy Treatment Patient Details Name: Joe Wilkinson MRN: [id number] DOB: 03-29-1961 Today's Date: 05/08/2017    History of present illness 56 yo male brought to [facility] with altered mental status from DKA and sepsis.  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  Required Rt decompressive craniectomy and remained on vent post op.ETT 8/23-9/5, s/p tracheostomy 9/5   OT comments  Pt progressing towards established OT goals. Pt requiring Total A +2 for squat pivot to chair. Pt requiring Min A to maintain upright sitting posture while sitting in chair at sink; pt requiring Max A and Max cues to correct posterior after lateral lean to L. Pt requiring Max cues for oral care at sink and Min VCs for locating grooming items on L side. Will continues to follow acutely to facilitate safe dc. Continue to recommend dc to SNF for further OT to optimize safety and independence with ADLs.    Follow Up Recommendations  SNF;Supervision/Assistance - 24 hour    Equipment Recommendations  Other (comment) (Defer to next venue)    Recommendations for Other Services      Precautions / Restrictions Precautions Precautions: Fall;Other (comment) Precaution Comments: craniectomy, no bone flap right side; trach in place; peg tube; L side neglect Restrictions Weight Bearing Restrictions: No Other Position/Activity Restrictions: craniectomy, no bone flap right side       Mobility Bed Mobility Overal bed mobility: Needs Assistance Bed Mobility: Supine to Sit;Sit to Supine Rolling: Max assist;Total assist;+2 for physical assistance   Supine to sit: Max assist;+2 for physical assistance;HOB elevated Sit to supine: +2 for physical assistance;Total assist   General bed mobility comments: pt able to use R UE to assist, able to move R LE off of bed, assist to elevate trunk and with L LE movement, use of bed pads to position pt's hips at EOB  Transfers Overall transfer level: Needs assistance    Transfers: Squat Pivot Transfers     Squat pivot transfers: Total assist;+2 physical assistance     General transfer comment: Total A to squat pivot from EOB to chair. Use of pad to elevate hips during transition    Balance Overall balance assessment: Needs assistance Sitting-balance support: Feet supported;Single extremity supported Sitting balance-Leahy Scale: Poor Sitting balance - Comments: pt tolerated sitting EOB x~15 minutes with lateral and A-P weight shifting; pt fluctuating between requiring mod A to very close min guard with R UE support Postural control: Left lateral lean;Posterior lean                                 ADL either performed or assessed with clinical judgement   ADL Overall ADL's : Needs assistance/impaired Eating/Feeding: NPO   Grooming: Oral care;Sitting;Cueing for sequencing;Cueing for safety;Moderate assistance Grooming Details (indicate cue type and reason): Pt performed grooming at sink. Pt sat in chair requiring Max A to correct sitting posture to correct L lateral leans, but requiring Min A for majority of sitting at sink. Pt requiring hand over hand with LUE for bilateral coordination when twisting off toothpaste cap. Pt requiring increased assistance as he fatigued. Pt requiring Min VCs to locate grooming items on L side of sink. Pt requiring VCs to inititate each step of grooming task.                              Functional mobility during ADLs: Total assistance;+2  for physical assistance (squat pivot only) General ADL Comments: Pt performing grooming task in chair at sink. Total A +2 to squat pivot from EOB to chair. At sink, pt requiring Max A to correct sitting posture. Min VCs to locate items on L side.      Vision Patient Visual Report: Other (comment) (right gaze preference) Vision Assessment?: Vision impaired- to be further tested in functional context Additional Comments: L inattention and R side gaze    Perception     Praxis      Cognition Arousal/Alertness: Awake/alert Behavior During Therapy: Flat affect Overall Cognitive Status: Difficult to assess Area of Impairment: Problem solving;Awareness;Safety/judgement;Following commands;Attention;Memory                   Current Attention Level: Sustained Memory: Decreased recall of precautions;Decreased short-term memory Following Commands: Follows one step commands with increased time;Follows one step commands inconsistently;Follows multi-step commands inconsistently;Follows multi-step commands with increased time Safety/Judgement: Decreased awareness of safety;Decreased awareness of deficits Awareness: Intellectual Problem Solving: Slow processing;Decreased initiation;Difficulty sequencing;Requires verbal cues;Requires tactile cues (multimodal cueing) General Comments: L inattention. Requires visual, verbal, and tactile cues to attend to L side, correct posture (significant lean to L), and perform mutlti-step grooming task. Requiring cues to intitiate each step of oral care.        Exercises     Shoulder Instructions       General Comments      Pertinent Vitals/ Pain       Pain Assessment: 0-10 Pain Score: 5  Faces Pain Scale: Hurts little more Pain Location: R side of head. "I don't liek coughing. It hurts when I cough" Pain Descriptors / Indicators: Throbbing Pain Intervention(s): Monitored during session;Repositioned  Home Living Family/patient expects to be discharged to:: Skilled nursing facility Living Arrangements: Alone Available Help at Discharge: Available 24 hours/day;Friend(s) Type of Home: Apartment Home Access: Level entry     Home Layout: One level     Bathroom Shower/Tub: Occupational psychologist: Standard                Prior Functioning/Environment Level of Independence: Independent            Frequency  Min 2X/week        Progress Toward Goals  OT  Goals(current goals can now be found in the care plan section)  Progress towards OT goals: Progressing toward goals  Acute Rehab OT Goals Patient Stated Goal: none stated OT Goal Formulation: Patient unable to participate in goal setting ADL Goals Pt Will Perform Grooming: with min assist;sitting Additional ADL Goal #1: Pt will perform bed mobility as precursor to ADL at min A level Additional ADL Goal #2: Pt will track items with multimodal cues past mdline 50% of the time Additional ADL Goal #3: Pt will tolerate sitting EOB with min A as precursor to performing ADL  Plan Discharge plan remains appropriate    Co-evaluation    PT/OT/SLP Co-Evaluation/Treatment: Yes Reason for Co-Treatment: Complexity of the patient's impairments (multi-system involvement) PT goals addressed during session: Mobility/safety with mobility OT goals addressed during session: ADL's and self-care      AM-PAC PT "6 Clicks" Daily Activity     Outcome Measure   Help from another person eating meals?: A Lot Help from another person taking care of personal grooming?: A Lot Help from another person toileting, which includes using toliet, bedpan, or urinal?: Total Help from another person bathing (including washing, rinsing, drying)?: Total Help from another person to  put on and taking off regular upper body clothing?: A Lot Help from another person to put on and taking off regular lower body clothing?: Total 6 Click Score: 9    End of Session Equipment Utilized During Treatment: Gait belt;Oxygen  OT Visit Diagnosis: Unsteadiness on feet (R26.81);Muscle weakness (generalized) (M62.81);Low vision, both eyes (H54.2);Other symptoms and signs involving the nervous system (R29.898);Other symptoms and signs involving cognitive function;Hemiplegia and hemiparesis;Pain Hemiplegia - Right/Left: Left Hemiplegia - dominant/non-dominant: Non-Dominant Hemiplegia - caused by: Cerebral infarction Pain - Right/Left:  Right Pain - part of body:  (head)   Activity Tolerance Patient tolerated treatment well   Patient Left in bed;with call bell/phone within reach;with bed alarm set   Nurse Communication Mobility status;Other (comment) (Requires supervision for feeding)        Time: 5102-5852 OT Time Calculation (min): 31 min  Charges: OT General Charges $OT Visit: 1 Visit OT Treatments $Self Care/Home Management : 8-22 mins  [place], OTR/L Acute Rehab Pager: [phone] Office: [place] 05/08/2017, 3:28 PM

## 2017-05-08 NOTE — Progress Notes (Signed)
PROGRESS NOTE    Joe Wilkinson  PIR:[id number] DOB: 30-May-1961 DOA: 03/31/2017 PCP: Patient, No Pcp Per    Brief Narrative: 20 DM who was [place] un[place] bus stop, taken to [facility] found to be in DKA with sepsis from uncertain source. He was in acute respiratory failure and had to be intubated.  He had hemiparesis secondary to massive right MCA and PCA territory infarcts with mass effect. He was transferred to the ICU and underwent decompressive right craniectomy 8/25. Hypertonic saline protocol was completed. He required transfusion support and was treated for AFib with RVR, continued on the ventilator, eventually receiving trach 9/5 and PEG 9/6, on ATC 9/7. Patient does not have health insurance and because of his craniectomy without the ability to fit a helmet, most skilled nursing facilities areunwilling to accept him.  Currently he is getting meds thru PEG and continuous TF.  Heparin was converted to prophylaxis Lovenox. He was transferred from PCCm service to [facility] service on 9/25. Currently awaiting placement to snf.    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 Completed the treatment for strep agalactiae treatment. Continues to have secretions with severe coughing spells on talkinG. slp eval recommended starting dysphagia 1 diet. Restarted tube feeds as he has minimal po intake. Aggressive pulmonary hygiene PMV as tolerated. Plan for decannulation by PCCM.  Appreciate pulmonary assistance.    Septic shock from pneumonia:  Resolved.    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  Aspirin as per neurology.  Follow up with neuro surgery in 2 months. As outpatient.  His FOBT is negative, requested Dr Leonie Man to see if he needs anti coagulation.  Repeat CT head ordered to see if there is any bleeding or mid line shift. Repeat CT head done, it showed Further swelling in the region of the complete right MCA territory infarction with more extensive bulging of the brain through the craniectomy defect. No midline shift however, because of the decompressive craniectomy. At the superior margin, there could be some extension of the swollen brain through a dural defect. Also at the superior margin, there is a small amount of petechial bleeding within the region of infarction.   New onset a fib with RVR  Currently in sinus.  Not on anticoagulation as he was anemic requiring 4 units of prbc transfusion. On aspirin 325 mg daily.  Resume metoprolol for rate control.    Diabetes mellitus:  CBG (last 3)   Recent Labs  05/08/17 0735 05/08/17 1109 05/08/17 1652  GLUCAP 133* 146* 118*    Resume SSI. And levemir daily.  No change in medications.    Anemia of blood loss of unclear source.  abd X ray  does not show any bleeding. FOBT is negative Hemoglobin appears to have been stabilized between 10 to 11.   E coli and klebsiella in urine, since he cannot verbalize urinary symptoms would treat him with keflex for 7 days.   Nutrition:  He has been on tube feeds till now, SLP recommended dysphagia 1 diet,.  abd distention on 9/29 , follow up with abd film doesn ot show any ileus or obstruction.  Resume tube feeds and encourage oral intake.    DVT prophylaxis:lovenox. Code Status: full code.  Family Communication: none at bedside. Called sister over the phone, could not leave message.  Disposition Plan:  awaiting placement at SNF.    Consultants:   PCCM   Procedures:   LP 8/24 >> glucose 205, RBC 16, protein 62, WBC 5 CT head 8/24 >> Large Rt MCA 8/25 decompressive craniectomy EEG 8/27 >> bilateral cerebral dysfunction, non-specific in etiology Echo 8/27 >> EF 65 to 70% CT abd/pelvis 8/30 >> third spacing, 3.5 cm low attenuation Lt hepatic lobe CT Head 9/3>> Severe right hemisphere cytotoxic edema with stable extension of edematous brain through the right craniectomy defect. Stable petechial hemorrhage without malignant hemorrhagicTransformation. Stable intracranial mass effect with leftward midline shift of 7 mm. Small left PCA territory infarct. 9/5 Trach placement.  9/6 PEG tube placement.  CXR 9/23 > LLL atx and small effusion Barrium swallow 9/21 > severe oral and mod pharyngeal dysphagia; mod to severe aspiration risk  CT head on 10/1 shows Further swelling in the region of the complete right MCA territory infarction with more extensive bulging of the brain through the craniectomy defect. No midline shift.   LINES/TUBES: ETT 8/23 >>9/5 Trach [name]) 9/5>>> Lt IJ CVL 8/23 >>removed PEG 9/6 >   Antimicrobials: keflex to complete the course for UTI.    Subjective: No new complaints, appears comfortable.    Objective: Vitals:   05/08/17 0943 05/08/17 1258 05/08/17 1316 05/08/17 1656  BP: 134/83  123/72   Pulse: 92 93 92 98  Resp: 20 18 20 20   Temp: 98.7 F (37.1 C)  99 F (37.2 C)   TempSrc: Oral  Oral   SpO2: 98% 97% 99% 97%  Weight:      Height:        Intake/Output Summary (Last 24 hours) at 05/08/17 1707 Last data filed at 05/08/17 1317  Gross per 24 hour  Intake                0 ml  Output             1150 ml  Net            -1150 ml   Filed Weights   04/25/17 0412 04/26/17 0416 04/26/17 2055  Weight: 88.4 kg (194 lb 14.2 oz) 85.5 kg (188 lb 7.9 oz) 88.7 kg (195 lb 8.8 oz)    Examination:   General exam: Appears calm and comfortable ATC IN PLACE  , not in distress.  Respiratory system:  Coarse breath sounds, no wheezing or rhonchi.  Cardiovascular system: S1 & S2 heard, RRR. No JVD.  Gastrointestinal system: Abdomen is soft non tender mildly distended bowel sounds good.  Central nervous system: Alert and able to move all extremities. Responding to commands.  Extremities: no cyanosis or clubbing. No pedal edema.  Skin: No rashes, lesions or ulcers Psychiatry: mood appropriate. No agitation.     Data Reviewed: I have personally reviewed following labs and imaging studies  CBC:  Recent Labs Lab 05/02/17 0527 05/04/17 0814 05/07/17 0017  WBC 13.4* 17.2* 13.1*  HGB 10.6* 11.5* 11.1*  HCT 36.6* 39.2 37.5*  MCV 95.8 96.3 94.7  PLT 308 276 591   Basic Metabolic Panel:  Recent Labs Lab 05/02/17 0527 05/07/17  0017  NA 138 135  K 4.3 4.5  CL 101 102  CO2 30 24  GLUCOSE 142* 97  BUN 31* 26*  CREATININE 0.77 0.80  CALCIUM 10.3 10.4*   GFR: Estimated Creatinine Clearance: 115.6 mL/min (by C-G formula based on SCr of 0.8 mg/dL). Liver Function Tests:  Recent Labs Lab 05/02/17 0527  AST 19  ALT 20  ALKPHOS 60  BILITOT 0.2*  PROT 7.2  ALBUMIN 2.5*    Recent Labs Lab 05/02/17 0527  LIPASE 33   No results for input(s): AMMONIA in the last 168 hours. Coagulation Profile: No results for input(s): INR, PROTIME in the last 168 hours. Cardiac Enzymes: No results for input(s): CKTOTAL, CKMB, CKMBINDEX, TROPONINI in the last 168 hours. BNP (last 3 results) No results for input(s): PROBNP in the last 8760 hours. HbA1C: No results for input(s): HGBA1C in the last 72 hours. CBG:  Recent Labs Lab 05/07/17 1652 05/07/17 2228 05/08/17 0735 05/08/17 1109 05/08/17 1652  GLUCAP 119* 133* 133* 146* 118*   Lipid Profile: No results for input(s): CHOL, HDL, LDLCALC, TRIG, CHOLHDL, LDLDIRECT in the last 72 hours. Thyroid Function Tests: No results for input(s): TSH, T4TOTAL, FREET4, T3FREE, THYROIDAB in the last  72 hours. Anemia Panel: No results for input(s): VITAMINB12, FOLATE, FERRITIN, TIBC, IRON, RETICCTPCT in the last 72 hours. Sepsis Labs: No results for input(s): PROCALCITON, LATICACIDVEN in the last 168 hours.  Recent Results (from the past 240 hour(s))  Culture, Urine     Status: Abnormal   Collection Time: 05/02/17  1:53 PM  Result Value Ref Range Status   Specimen Description URINE, RANDOM  Final   Special Requests NONE  Final   Culture (A)  Final    >=100,000 COLONIES/mL KLEBSIELLA PNEUMONIAE >=100,000 COLONIES/mL ESCHERICHIA COLI    Report Status 05/05/2017 FINAL  Final   Organism ID, Bacteria KLEBSIELLA PNEUMONIAE (A)  Final   Organism ID, Bacteria ESCHERICHIA COLI (A)  Final      Susceptibility   Escherichia coli - MIC*    AMPICILLIN >=32 RESISTANT Resistant     CEFAZOLIN <=4 SENSITIVE Sensitive     CEFTRIAXONE <=1 SENSITIVE Sensitive     CIPROFLOXACIN >=4 RESISTANT Resistant     GENTAMICIN <=1 SENSITIVE Sensitive     IMIPENEM <=0.25 SENSITIVE Sensitive     NITROFURANTOIN <=16 SENSITIVE Sensitive     TRIMETH/SULFA >=320 RESISTANT Resistant     AMPICILLIN/SULBACTAM >=32 RESISTANT Resistant     PIP/TAZO <=4 SENSITIVE Sensitive     Extended ESBL NEGATIVE Sensitive     * >=100,000 COLONIES/mL ESCHERICHIA COLI   Klebsiella pneumoniae - MIC*    AMPICILLIN >=32 RESISTANT Resistant     CEFAZOLIN <=4 SENSITIVE Sensitive     CEFTRIAXONE <=1 SENSITIVE Sensitive     CIPROFLOXACIN <=0.25 SENSITIVE Sensitive     GENTAMICIN <=1 SENSITIVE Sensitive     IMIPENEM <=0.25 SENSITIVE Sensitive     NITROFURANTOIN 64 INTERMEDIATE Intermediate     TRIMETH/SULFA <=20 SENSITIVE Sensitive     AMPICILLIN/SULBACTAM 4 SENSITIVE Sensitive     PIP/TAZO <=4 SENSITIVE Sensitive     Extended ESBL NEGATIVE Sensitive     * >=100,000 COLONIES/mL KLEBSIELLA PNEUMONIAE         Radiology Studies: Ct Head Wo Contrast  Result Date: 05/08/2017 CLINICAL DATA:  Followup craniectomy.  Headache. EXAM:  CT HEAD WITHOUT CONTRAST TECHNIQUE: Contiguous axial images were obtained from the base of the skull through the vertex without intravenous contrast. COMPARISON:  04/10/2017 and multiple previous FINDINGS:  Brain: Massive swelling in the region of the complete right MCA territory infarction with more protrusion of the infarcted brain tissue through the craniectomy defect. Mild petechial bleeding within the superior portion of the infarction but without frank hematoma. No new vascular territory involvement. No midline shift because of the decompressive craniectomy. No ventricular trapping. No extra-axial collection. Along the upper region of the craniectomy, I wonder if there could actually be a dural defect through which some of the swollen brain has extended. Vascular: No other vascular finding. Skull: No unexpected finding related to the craniectomy. Sinuses/Orbits: No significant sinus finding. Old blowout fracture on the right. Other: None significant IMPRESSION: Further swelling in the region of the complete right MCA territory infarction with more extensive bulging of the brain through the craniectomy defect. No midline shift however, because of the decompressive craniectomy. At the superior margin, there could be some extension of the swollen brain through a dural defect. Also at the superior margin, there is a small amount of petechial bleeding within the region of infarction. Electronically Signed   By: Nelson Chimes M.D.   On: 05/08/2017 14:12   Dg Abd 2 Views  Result Date: 05/06/2017 CLINICAL DATA:  Abdominal distension. EXAM: ABDOMEN - 2 VIEW COMPARISON:  May 02, 2017. FINDINGS: No abnormal bowel dilatation is noted. Gastrostomy tube is seen projected over gastric air bubble. Residual contrast is seen in the colon. No abnormal calcifications are noted. IMPRESSION: No evidence of bowel obstruction or ileus. Electronically Signed   By: Marijo Conception, M.D.   On: 05/06/2017 21:02         Scheduled Meds: . acetaminophen (TYLENOL) oral liquid 160 mg/5 mL  650 mg Per Tube TID  . aspirin  325 mg Per Tube Daily  . cephALEXin  500 mg Oral Q12H  . chlorhexidine  15 mL Mouth Rinse BID  . enoxaparin (LOVENOX) injection  40 mg Subcutaneous Q24H  . feeding supplement (PRO-STAT SUGAR FREE 64)  30 mL Per Tube BID  . free water  250 mL Per Tube Q8H  . gabapentin  300 mg Oral QHS  . glycopyrrolate  1 mg Per Tube BID  . insulin aspart  0-20 Units Subcutaneous TID WC  . insulin aspart  0-5 Units Subcutaneous QHS  . insulin detemir  34 Units Subcutaneous Daily  . metoprolol tartrate  12.5 mg Per Tube BID  . pantoprazole sodium  40 mg Per Tube Daily   Continuous Infusions: . feeding supplement (GLUCERNA 1.2 CAL) 1,000 mL (05/07/17 2220)     LOS: 37 days    Time spent: 35 minutes.     Hosie Poisson, MD [facility] Pager [phone]   If 7PM-7AM, please contact night-coverage [email or web address] Password [email or web address] 05/08/2017, 5:07 PM

## 2017-05-08 NOTE — Progress Notes (Signed)
Physical Therapy Treatment Patient Details Name: Joe Wilkinson MRN: [id number] DOB: 20-Aug-1960 Today's Date: 05/08/2017    History of Present Illness 56 yo male brought to [facility] with altered mental status from DKA and sepsis.  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  Required Rt decompressive craniectomy and remained on vent post op.ETT 8/23-9/5, s/p tracheostomy 9/5    PT Comments    Patient seen for attempts at OOB activity progression. Assisted patient into chair tolerated chair ~ 20 but required physical assist to maintain sitting balance throughout entirety of session. Patient continues to show poor trunk control and limited overall activity tolerance today due to fatigue. Will continue to see and progress as tolerated. SNF remains appropriate.   Follow Up Recommendations  SNF;Supervision/Assistance - 24 hour     Equipment Recommendations  Other (comment) (defer to next venue)    Recommendations for Other Services       Precautions / Restrictions Precautions Precautions: Fall;Other (comment) Precaution Comments: craniectomy, no bone flap right side; trach in place; peg tube; L side neglect Restrictions Weight Bearing Restrictions: No Other Position/Activity Restrictions: craniectomy, no bone flap right side    Mobility  Bed Mobility Overal bed mobility: Needs Assistance Bed Mobility: Supine to Sit;Sit to Supine Rolling: Max assist;Total assist;+2 for physical assistance   Supine to sit: Max assist;+2 for physical assistance;HOB elevated Sit to supine: +2 for physical assistance;Total assist   General bed mobility comments: pt able to use R UE to assist, able to move R LE off of bed, assist to elevate trunk and with L LE movement, use of bed pads to position pt's hips at EOB  Transfers Overall transfer level: Needs assistance   Transfers: Squat Pivot Transfers     Squat pivot transfers: Total assist;+2 physical assistance     General transfer comment: Total  A to squat pivot from EOB to chair. Use of pad to elevate hips during transition  Ambulation/Gait                 Stairs            Wheelchair Mobility    Modified Rankin (Stroke Patients Only)       Balance Overall balance assessment: Needs assistance Sitting-balance support: Feet supported;Single extremity supported Sitting balance-Leahy Scale: Poor Sitting balance - Comments: pt tolerated sitting EOB x~15 minutes with lateral and A-P weight shifting; pt fluctuating between requiring mod A to very close min guard with R UE support Postural control: Left lateral lean;Posterior lean                                  Cognition Arousal/Alertness: Awake/alert Behavior During Therapy: Flat affect Overall Cognitive Status: Difficult to assess Area of Impairment: Problem solving;Awareness;Safety/judgement;Following commands;Attention;Memory                   Current Attention Level: Sustained Memory: Decreased recall of precautions;Decreased short-term memory Following Commands: Follows one step commands with increased time;Follows one step commands inconsistently;Follows multi-step commands inconsistently;Follows multi-step commands with increased time Safety/Judgement: Decreased awareness of safety;Decreased awareness of deficits Awareness: Intellectual Problem Solving: Slow processing;Decreased initiation;Difficulty sequencing;Requires verbal cues;Requires tactile cues (multimodal cueing) General Comments: L inattention. Requires visual, verbal, and tactile cues to attend to L side, correct posture (significant lean to L), and perform mutlti-step grooming task. Requiring cues to intitiate each step of oral care.      Exercises Other Exercises  Other Exercises: trunk control activities in conjunction with functional task performance and cognitive tasks    General Comments        Pertinent Vitals/Pain Pain Assessment: 0-10 Pain Score: 5   Faces Pain Scale: Hurts little more Pain Location: R side of head. "I don't liek coughing. It hurts when I cough" Pain Descriptors / Indicators: Throbbing Pain Intervention(s): Monitored during session;Repositioned    Home Living Family/patient expects to be discharged to:: Skilled nursing facility Living Arrangements: Alone Available Help at Discharge: Available 24 hours/day;Friend(s) Type of Home: Apartment Home Access: Level entry   Home Layout: One level        Prior Function Level of Independence: Independent          PT Goals (current goals can now be found in the care plan section) Acute Rehab PT Goals Patient Stated Goal: none stated PT Goal Formulation: Patient unable to participate in goal setting Time For Goal Achievement: 05/18/17 Potential to Achieve Goals: Fair Progress towards PT goals: Progressing toward goals (modest progression)    Frequency    Min 3X/week      PT Plan Current plan remains appropriate    Co-evaluation PT/OT/SLP Co-Evaluation/Treatment: Yes Reason for Co-Treatment: Complexity of the patient's impairments (multi-system involvement) PT goals addressed during session: Mobility/safety with mobility OT goals addressed during session: ADL's and self-care      AM-PAC PT "6 Clicks" Daily Activity  Outcome Measure  Difficulty turning over in bed (including adjusting bedclothes, sheets and blankets)?: Unable Difficulty moving from lying on back to sitting on the side of the bed? : Unable Difficulty sitting down on and standing up from a chair with arms (e.g., wheelchair, bedside commode, etc,.)?: Unable Help needed moving to and from a bed to chair (including a wheelchair)?: Total Help needed walking in hospital room?: Total Help needed climbing 3-5 steps with a railing? : Total 6 Click Score: 6    End of Session Equipment Utilized During Treatment: Oxygen;Other (comment) (28% trach collar) Activity Tolerance: Patient tolerated  treatment well Patient left: in bed;with call bell/phone within reach;with bed alarm set Nurse Communication: Mobility status;Need for lift equipment PT Visit Diagnosis: Other symptoms and signs involving the nervous system (R29.898);Hemiplegia and hemiparesis Hemiplegia - Right/Left: Left Hemiplegia - dominant/non-dominant: Non-dominant Hemiplegia - caused by: Nontraumatic intracerebral hemorrhage     Time: 2035-5974 PT Time Calculation (min) (ACUTE ONLY): 31 min  Charges:  $Therapeutic Activity: 8-22 mins                    G Codes:       Alben Deeds, PT DPT  Board Certified Neurologic Specialist [phone]    Duncan Dull 05/08/2017, 4:38 PM

## 2017-05-08 NOTE — Care Management Note (Signed)
Case Management Note  Patient Details  Name: Joe Wilkinson MRN: [id number] Date of Birth: 26-Nov-1960  Subjective/Objective:                    Action/Plan: Patient continues on the difficult to place SNF list. CM following.  Expected Discharge Date:  04/28/17               Expected Discharge Plan:  [place]  In-House Referral:  Clinical Social Work, Scientist, research (medical)  CM Consult  Post Acute Care Choice:    Choice offered to:     DME Arranged:    DME Agency:     HH Arranged:    [place] Agency:     Status of Service:  In process, will continue to follow  If discussed at Long Length of Stay Meetings, dates discussed:    Additional Comments:  Pollie Friar, RN 05/08/2017, 12:12 PM

## 2017-05-08 NOTE — Progress Notes (Signed)
Nutrition Follow-up  DOCUMENTATION CODES:   Not applicable  INTERVENTION:   Recommend obtaining new weight reading  Continue   Glucerna 1.2 at goal rate of 65 ml/hr (1560 mL/day)  Prostat 30 ml BID  250 mL free water q 8 hours  Providing 2072 kcal, 124 grams protein, and 2014 ml free water daily  NUTRITION DIAGNOSIS:   Inadequate oral intake related to inability to eat as evidenced by NPO status.  Ongoing   GOAL:   Patient will meet greater than or equal to 90% of their needs  Met via TF, progressing via Dysphagia 1 diet   MONITOR:   TF tolerance, Labs, Weight trends, Skin, I & O's  ASSESSMENT:   Pt admitted to [facility] with AMS from DKA and sepsis, found to have R MCA CVA with 12 mm midline shift and transferred to [facility], required R decompressive craniectomy.   Pt's diet advanced to Dysphagia 1/thin liquids on 09/28 at 1503. SLP continues to follow. Meal completion records show pt has consumed  ~18% of recorded meals in addition to current TF regimen. Pt reports no N/V at this time. Pt's tray on hold at bedside.  No recorded weight reading since 09/19.  Labs reviewed; CBG 101-174 Medications reviewed; sliding scale insulin, Levemir, Protonix  Diet Order:  DIET - DYS 1 Room service appropriate? Yes; Fluid consistency: Thin  Skin:  Wound (see comment) (perianal cellulitis, no pressure ulcers)  Last BM:  9/23  Height:   Ht Readings from Last 1 Encounters:  04/26/17 5' 10" (1.778 m)    Weight:   Wt Readings from Last 1 Encounters:  04/26/17 195 lb 8.8 oz (88.7 kg)    Ideal Body Weight:  75.45 kg  BMI:  Body mass index is 28.06 kg/m.  Estimated Nutritional Needs:   Kcal:  2100-2300  Protein:  115-130 gm  Fluid:  2.1-2.3 L  EDUCATION NEEDS:   No education needs identified at this time   [place], MS, RDN, LDN 05/08/2017 3:42 PM  

## 2017-05-09 LAB — GLUCOSE, CAPILLARY
GLUCOSE-CAPILLARY: 88 mg/dL (ref 65–99)
Glucose-Capillary: 116 mg/dL — ABNORMAL HIGH (ref 65–99)
Glucose-Capillary: 149 mg/dL — ABNORMAL HIGH (ref 65–99)
Glucose-Capillary: 139 mg/dL — ABNORMAL HIGH (ref 65–99)

## 2017-05-09 MED ORDER — APIXABAN 5 MG PO TABS
5.0000 mg | ORAL_TABLET | Freq: Two times a day (BID) | ORAL | Status: DC
  Administered 2017-05-09 – 2017-05-19 (×20): 5 mg via ORAL
  Filled 2017-05-09 (×20): qty 1

## 2017-05-09 NOTE — Progress Notes (Signed)
PROGRESS NOTE    Joe Wilkinson  JOA:[id number] DOB: 08-04-1961 DOA: 03/31/2017 PCP: Patient, No Pcp Per    Brief Narrative: 18 DM who was [place] un[place] bus stop, taken to [facility] found to be in DKA with sepsis from uncertain source. He was in acute respiratory failure and had to be intubated.  He had hemiparesis secondary to massive right MCA and PCA territory infarcts with mass effect. He was transferred to the ICU and underwent decompressive right craniectomy 8/25. Hypertonic saline protocol was completed. He required transfusion support and was treated for AFib with RVR, continued on the ventilator, eventually receiving trach 9/5 and PEG 9/6, on ATC 9/7. Patient does not have health insurance and because of his craniectomy without the ability to fit a helmet, most skilled nursing facilities areunwilling to accept him.  Currently he is getting meds thru PEG and continuous TF.  Heparin was converted to prophylaxis Lovenox. He was transferred from PCCm service to [facility] service on 9/25. Currently awaiting placement to snf.    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 Completed the treatment for strep agalactiae treatment. Continues to have secretions with severe coughing spells on talking. slp eval recommended starting dysphagia 1 diet. Restarted tube feeds as he has minimal po intake. Aggressive pulmonary hygiene PMV as tolerated. Plan for decannulation by PCCM.  Appreciate pulmonary assistance.    Septic shock from pneumonia:  Resolved.    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  Aspirin as per neurology.  Follow up with neuro surgery in 2 months. As outpatient.  His FOBT is negative, requested Dr Joe Wilkinson to see if he needs anti coagulation.  Repeat CT head ordered to see if there is any bleeding or mid line shift. Repeat CT head done, it showed Further swelling in the region of the complete right MCA territory infarction with more extensive bulging of the brain through the craniectomy defect. No midline shift however, because of the decompressive craniectomy. At the superior margin, there could be some extension of the swollen brain through a dural defect. Also at the superior margin, there is a small amount of petechial bleeding within the region of infarction.  Discussed with Dr Joe Wilkinson, and starting eliquis today. Monitor for signs of bleeding.    New onset A fib with RVR  Currently in sinus.  Not on anticoagulation as he was anemic requiring 4 units of prbc transfusion. Stop the aspirin and start the eliquis.  Resume metoprolol for rate control.    Diabetes mellitus:  CBG (last 3)   Recent Labs  05/09/17 0739 05/09/17 1059 05/09/17 1637  GLUCAP 116* 149* 88    Resume SSI. And levemir daily.  No change in medications.    Anemia of blood loss of unclear source.  abd X ray  does not show any bleeding. FOBT is negative Hemoglobin appears to have been stabilized between 10 to 11.   E coli and klebsiella in urine,  since he cannot verbalize urinary symptoms would treat him with keflex for 7 days.   Nutrition:  He has been on tube feeds till now, SLP recommended dysphagia 1 diet,.  abd distention on 9/29 , follow up with abd film doesn ot show any ileus or obstruction.  Resume tube feeds and encourage oral intake.    DVT prophylaxis:lovenox. Code Status: full code.  Family  Communication: none at bedside. Called sister over the phone, could not leave message.  Disposition Plan: awaiting placement at SNF.    Consultants:   PCCM   Procedures:   LP 8/24 >> glucose 205, RBC 16, protein 62, WBC 5 CT head 8/24 >> Large Rt MCA 8/25 decompressive craniectomy EEG 8/27 >> bilateral cerebral dysfunction, non-specific in etiology Echo 8/27 >> EF 65 to 70% CT abd/pelvis 8/30 >> third spacing, 3.5 cm low attenuation Lt hepatic lobe CT Head 9/3>> Severe right hemisphere cytotoxic edema with stable extension of edematous brain through the right craniectomy defect. Stable petechial hemorrhage without malignant hemorrhagicTransformation. Stable intracranial mass effect with leftward midline shift of 7 mm. Small left PCA territory infarct. 9/5 Trach placement.  9/6 PEG tube placement.  CXR 9/23 > LLL atx and small effusion Barrium swallow 9/21 > severe oral and mod pharyngeal dysphagia; mod to severe aspiration risk  CT head on 10/1 shows Further swelling in the region of the complete right MCA territory infarction with more extensive bulging of the brain through the craniectomy defect. No midline shift.   LINES/TUBES: ETT 8/23 >>9/5 Trach [name]) 9/5>>> Lt IJ CVL 8/23 >>removed PEG 9/6 >   Antimicrobials: keflex to complete the course for UTI.    Subjective: No new complaints.    Objective: Vitals:   05/09/17 1008 05/09/17 1237 05/09/17 1319 05/09/17 1500  BP: 113/75  121/75   Pulse: 98 89 91 92  Resp: 18 18 18 18   Temp: 98.6 F (37 C)  98.4 F (36.9 C)   TempSrc: Oral  Axillary   SpO2: 96% 96% 99% 95%  Weight:      Height:        Intake/Output Summary (Last 24 hours) at 05/09/17 1643 Last data filed at 05/09/17 1257  Gross per 24 hour  Intake              100 ml  Output             1525 ml  Net            -1425 ml   Filed Weights   04/25/17 0412 04/26/17 0416 04/26/17 2055  Weight: 88.4 kg (194 lb 14.2 oz) 85.5 kg (188 lb 7.9 oz) 88.7 kg (195  lb 8.8 oz)    Examination:  Unchanged from yesterday.  General exam: Appears calm and comfortable ATC IN PLACE , not in distress.  Respiratory system:  Coarse breath sounds, no wheezing or rhonchi.  Cardiovascular system: S1 & S2 heard, RRR. No JVD.  Gastrointestinal system: Abdomen is soft non tender mildly distended bowel sounds good.  Central nervous system: Alert and able to move all extremities. Responding to commands.  Extremities: no cyanosis or clubbing. No pedal edema.  Skin: No rashes, lesions or ulcers Psychiatry: mood appropriate. No agitation.     Data Reviewed: I have personally reviewed following labs and imaging studies  CBC:  Recent Labs Lab 05/04/17 0814 05/07/17 0017  WBC 17.2* 13.1*  HGB 11.5* 11.1*  HCT 39.2 37.5*  MCV 96.3 94.7  PLT 276 192  Basic Metabolic Panel:  Recent Labs Lab 05/07/17 0017  NA 135  K 4.5  CL 102  CO2 24  GLUCOSE 97  BUN 26*  CREATININE 0.80  CALCIUM 10.4*   GFR: Estimated Creatinine Clearance: 115.6 mL/min (by C-G formula based on SCr of 0.8 mg/dL). Liver Function Tests: No results for input(s): AST, ALT, ALKPHOS, BILITOT, PROT, ALBUMIN in the last 168 hours. No results for input(s): LIPASE, AMYLASE in the last 168 hours. No results for input(s): AMMONIA in the last 168 hours. Coagulation Profile: No results for input(s): INR, PROTIME in the last 168 hours. Cardiac Enzymes: No results for input(s): CKTOTAL, CKMB, CKMBINDEX, TROPONINI in the last 168 hours. BNP (last 3 results) No results for input(s): PROBNP in the last 8760 hours. HbA1C: No results for input(s): HGBA1C in the last 72 hours. CBG:  Recent Labs Lab 05/08/17 1652 05/08/17 2250 05/09/17 0739 05/09/17 1059 05/09/17 1637  GLUCAP 118* 111* 116* 149* 88   Lipid Profile: No results for input(s): CHOL, HDL, LDLCALC, TRIG, CHOLHDL, LDLDIRECT in the last 72 hours. Thyroid Function Tests: No results for input(s): TSH, T4TOTAL, FREET4, T3FREE,  THYROIDAB in the last 72 hours. Anemia Panel: No results for input(s): VITAMINB12, FOLATE, FERRITIN, TIBC, IRON, RETICCTPCT in the last 72 hours. Sepsis Labs: No results for input(s): PROCALCITON, LATICACIDVEN in the last 168 hours.  Recent Results (from the past 240 hour(s))  Culture, Urine     Status: Abnormal   Collection Time: 05/02/17  1:53 PM  Result Value Ref Range Status   Specimen Description URINE, RANDOM  Final   Special Requests NONE  Final   Culture (A)  Final    >=100,000 COLONIES/mL KLEBSIELLA PNEUMONIAE >=100,000 COLONIES/mL ESCHERICHIA COLI    Report Status 05/05/2017 FINAL  Final   Organism ID, Bacteria KLEBSIELLA PNEUMONIAE (A)  Final   Organism ID, Bacteria ESCHERICHIA COLI (A)  Final      Susceptibility   Escherichia coli - MIC*    AMPICILLIN >=32 RESISTANT Resistant     CEFAZOLIN <=4 SENSITIVE Sensitive     CEFTRIAXONE <=1 SENSITIVE Sensitive     CIPROFLOXACIN >=4 RESISTANT Resistant     GENTAMICIN <=1 SENSITIVE Sensitive     IMIPENEM <=0.25 SENSITIVE Sensitive     NITROFURANTOIN <=16 SENSITIVE Sensitive     TRIMETH/SULFA >=320 RESISTANT Resistant     AMPICILLIN/SULBACTAM >=32 RESISTANT Resistant     PIP/TAZO <=4 SENSITIVE Sensitive     Extended ESBL NEGATIVE Sensitive     * >=100,000 COLONIES/mL ESCHERICHIA COLI   Klebsiella pneumoniae - MIC*    AMPICILLIN >=32 RESISTANT Resistant     CEFAZOLIN <=4 SENSITIVE Sensitive     CEFTRIAXONE <=1 SENSITIVE Sensitive     CIPROFLOXACIN <=0.25 SENSITIVE Sensitive     GENTAMICIN <=1 SENSITIVE Sensitive     IMIPENEM <=0.25 SENSITIVE Sensitive     NITROFURANTOIN 64 INTERMEDIATE Intermediate     TRIMETH/SULFA <=20 SENSITIVE Sensitive     AMPICILLIN/SULBACTAM 4 SENSITIVE Sensitive     PIP/TAZO <=4 SENSITIVE Sensitive     Extended ESBL NEGATIVE Sensitive     * >=100,000 COLONIES/mL KLEBSIELLA PNEUMONIAE         Radiology Studies: Ct Head Wo Contrast  Result Date: 05/08/2017 CLINICAL DATA:  Followup  craniectomy.  Headache. EXAM: CT HEAD WITHOUT CONTRAST TECHNIQUE: Contiguous axial images were obtained from the base of the skull through the vertex without intravenous contrast. COMPARISON:  04/10/2017 and multiple previous FINDINGS: Brain: Massive swelling in the region of the complete right MCA  territory infarction with more protrusion of the infarcted brain tissue through the craniectomy defect. Mild petechial bleeding within the superior portion of the infarction but without frank hematoma. No new vascular territory involvement. No midline shift because of the decompressive craniectomy. No ventricular trapping. No extra-axial collection. Along the upper region of the craniectomy, I wonder if there could actually be a dural defect through which some of the swollen brain has extended. Vascular: No other vascular finding. Skull: No unexpected finding related to the craniectomy. Sinuses/Orbits: No significant sinus finding. Old blowout fracture on the right. Other: None significant IMPRESSION: Further swelling in the region of the complete right MCA territory infarction with more extensive bulging of the brain through the craniectomy defect. No midline shift however, because of the decompressive craniectomy. At the superior margin, there could be some extension of the swollen brain through a dural defect. Also at the superior margin, there is a small amount of petechial bleeding within the region of infarction. Electronically Signed   By: Nelson Chimes M.D.   On: 05/08/2017 14:12        Scheduled Meds: . acetaminophen (TYLENOL) oral liquid 160 mg/5 mL  650 mg Per Tube TID  . aspirin  325 mg Per Tube Daily  . cephALEXin  500 mg Oral Q12H  . chlorhexidine  15 mL Mouth Rinse BID  . enoxaparin (LOVENOX) injection  40 mg Subcutaneous Q24H  . feeding supplement (PRO-STAT SUGAR FREE 64)  30 mL Per Tube BID  . free water  250 mL Per Tube Q8H  . gabapentin  300 mg Oral QHS  . glycopyrrolate  1 mg Per Tube  BID  . insulin aspart  0-20 Units Subcutaneous TID WC  . insulin aspart  0-5 Units Subcutaneous QHS  . insulin detemir  34 Units Subcutaneous Daily  . metoprolol tartrate  12.5 mg Per Tube BID  . pantoprazole sodium  40 mg Per Tube Daily   Continuous Infusions: . feeding supplement (GLUCERNA 1.2 CAL) 1,000 mL (05/07/17 2220)     LOS: 38 days    Time spent: 35 minutes.     Hosie Poisson, MD [facility] Pager [phone]   If 7PM-7AM, please contact night-coverage [email or web address] Password [facility] 05/09/2017, 4:43 PM

## 2017-05-09 NOTE — Progress Notes (Signed)
CSW continuing to follow for discharge planning. Patient remains difficult to place; still no bed at [facility] at this time.  CSW to follow.  Laveda Abbe, [place] Clinical Social Worker [phone]

## 2017-05-09 NOTE — Progress Notes (Signed)
RT advised pt he needed to cough or be suctioned by RT. Pt declines suctioning at this time and states he will cough later. PT eating now with techs at bedside. RT will try again at next check.

## 2017-05-09 NOTE — Progress Notes (Signed)
ANTICOAGULATION CONSULT NOTE - Initial Consult  Pharmacy Consult for apixaban  Indication: atrial fibrillation  No Known Allergies  Patient Measurements: Height: 5\' 10"  (177.8 cm) Weight: 195 lb 8.8 oz (88.7 kg) IBW/kg (Calculated) : 73 Heparin Dosing Weight: 88 kg   Vital Signs: Temp: 98.4 F (36.9 C) (10/02 1319) Temp Source: Axillary (10/02 1319) BP: 121/75 (10/02 1319) Pulse Rate: 92 (10/02 1500)  Labs:  Recent Labs  05/07/17 0017  HGB 11.1*  HCT 37.5*  PLT 192  CREATININE 0.80    Estimated Creatinine Clearance: 115.6 mL/min (by C-G formula based on SCr of 0.8 mg/dL).   Medical History: History reviewed. No pertinent past medical history.  Medications:  Prescriptions Prior to Admission  Medication Sig Dispense Refill Last Dose  . insulin aspart (NOVOLOG) 100 UNIT/ML injection Inject 10 Units into the skin 3 (three) times daily before meals.     . insulin detemir (LEVEMIR) 100 UNIT/ML injection Inject 40 Units into the skin at bedtime.     Marland Kitchen lisinopril (PRINIVIL,ZESTRIL) 10 MG tablet Take 10 mg by mouth daily.     . metFORMIN (GLUCOPHAGE-XR) 500 MG 24 hr tablet Take 500 mg by mouth daily with breakfast.       Assessment: 39 YOM with new right MCA to start apixaban for Afib for secondary stroke prevention. Currently on SQ lovenox for VTE prophylaxis. Previous dose was given at 2200 yesterday. SCr 0.8, H/H 11.1/37.5. Plt wnl.   Goal of Therapy:  Secondary stroke prevention Monitor platelets by anticoagulation protocol: Yes   Plan:  -Start apixaban 5 mg twice daily  -Stop SQ lovenox -Monitor renal fx and s/s of bleeding  -Educate on Eliquis prior to discharge   Albertina Parr, PharmD., BCPS Clinical Pharmacist Pager [phone]

## 2017-05-09 NOTE — Progress Notes (Signed)
  Speech Language Pathology Treatment: Dysphagia;Cognitive-Linquistic  Patient Details Name: Joe Wilkinson MRN: [id number] DOB: 01/22/1961 Today's Date: 05/09/2017 Time: 1020-1040 SLP Time Calculation (min) (ACUTE ONLY): 20 min  Assessment / Plan / Recommendation Clinical Impression  Pt able to participate in basic functional task with max verbal and visual cues to attend to objects at midline and initiate self feeding. Pt also needed max verbal cues to verbalize wants and needs. Vocal quality soft but intelligible. Tolerance of PMSV uneventful. Continues to show severe anterior spillage and pocketing with solid textures on the left side of mouth.  Clears with max verbal and tactile cues Discussed with NTs who agree with this during meals. Pt would like bacon, doesn't like pureed foods, intake is poor. Will continue to facilitate awareness with compensatory strategies.   HPI HPI: 56 yo male brought to [facility] with altered mental status from DKA and sepsis. 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 Required Rt decompressive craniectomy and remained on vent post op.ETT 8/23-9/5, s/p tracheostomy 9/5.       SLP Plan  Continue with current plan of care       Recommendations  Diet recommendations: Dysphagia 1 (puree);Thin liquid Liquids provided via: Cup Medication Administration: Crushed with puree Supervision: Staff to assist with self feeding;Full supervision/cueing for compensatory strategies Compensations: Minimize environmental distractions;Follow solids with liquid Postural Changes and/or Swallow Maneuvers: Seated upright 90 degrees      Patient may use Passy-Muir Speech Valve: Intermittently with supervision;During all therapies with supervision;During PO intake/meals PMSV Supervision: Full MD: Please consider changing trach tube to : Smaller size         Oral Care Recommendations: Oral care BID Follow up Recommendations: Skilled Nursing facility SLP Visit Diagnosis:  Dysphagia, oropharyngeal phase (R13.12) Plan: Continue with current plan of care       [place] Cherae Marton, MA CCC-SLP [phone]  Lynann Beaver 05/09/2017, 11:12 AM

## 2017-05-10 DIAGNOSIS — R109 Unspecified abdominal pain: Secondary | ICD-10-CM

## 2017-05-10 DIAGNOSIS — G44319 Acute post-traumatic headache, not intractable: Secondary | ICD-10-CM

## 2017-05-10 DIAGNOSIS — R140 Abdominal distension (gaseous): Secondary | ICD-10-CM

## 2017-05-10 DIAGNOSIS — M542 Cervicalgia: Secondary | ICD-10-CM

## 2017-05-10 LAB — CBC
HCT: 36.4 % — ABNORMAL LOW (ref 39.0–52.0)
Hemoglobin: 10.8 g/dL — ABNORMAL LOW (ref 13.0–17.0)
MCH: 27.8 pg (ref 26.0–34.0)
MCHC: 29.7 g/dL — AB (ref 30.0–36.0)
MCV: 93.6 fL (ref 78.0–100.0)
PLATELETS: 208 10*3/uL (ref 150–400)
RBC: 3.89 MIL/uL — ABNORMAL LOW (ref 4.22–5.81)
RDW: 16.6 % — AB (ref 11.5–15.5)
WBC: 11.2 10*3/uL — AB (ref 4.0–10.5)

## 2017-05-10 LAB — GLUCOSE, CAPILLARY
GLUCOSE-CAPILLARY: 147 mg/dL — AB (ref 65–99)
Glucose-Capillary: 133 mg/dL — ABNORMAL HIGH (ref 65–99)
Glucose-Capillary: 114 mg/dL — ABNORMAL HIGH (ref 65–99)
Glucose-Capillary: 99 mg/dL (ref 65–99)

## 2017-05-10 NOTE — Progress Notes (Signed)
PROGRESS NOTE    Joe Wilkinson  OHY:[id number] DOB: 1960-08-30 DOA: 03/31/2017 PCP: Patient, No Pcp Per   No chief complaint on file.   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  Now transferred to [facility] ICU for NeuroSx care, seen by NSX and Neuro.  Tentative plan for crani in AM. He is intubated and sedated and unable to provide any HPI, ROS, PMH, PSH, FH, home meds, allergies.  All Hx form medical record.  Interim history  He was transferred to the ICU and underwent decompressive right craniectomy 8/25. Hypertonic saline protocol was completed. He required transfusion support and was treated for AFib with RVR, continued on the ventilator, eventually receiving trach 9/5 and PEG 9/6, on ATC 9/7. Patient does not have health insurance and because of his craniectomy without the ability to fit a helmet, most skilled nursing facilities areunwilling to accept him.  Currently he is getting meds thru PEG and continuous TF. Heparin was converted to prophylaxis Lovenox. He was transferred from PCCm service to [facility] service on 9/25. Currently awaiting placement to snf.  Assessment & Plan   Acute respiratory failure with hypoxia and hypercarbia  -Continue pulmonary hygiene  -s/p trach -plan for decannulation today by PCCM  Septic shock secondary to S. 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 Leonie Man,  recommended anticoagulation (FOBT negative), started on Eliquis  -LDL 38, hemoglobin A1C 14.3 -Repeat CT had showed further swelling in the region of the complete right MCA territory infarction with more extensive bulging of the brain through the craniectomy defect. No midline shift however because of decompressive craniotomy. At superior margin, there could be some extension of the swollen brain through a dural defect. At the superior margin, small amount of petechial bleeding within the region of the infarction. -PT/OT rec SNF  Atrial fibrillation with RVR, new onset -Currently in sinus rhythm -CHADSVASC 4 -Patient was not placed on anticoagulation as he was found to be anemic and required 4 units PRBC transfusion this admission -Continue Eliquis and metoprolol  Diabetes mellitus, type II -Hemoglobin A1c 14.3 -Continue Levemir, insulin sliding scale CBG monitoring  Anemia blood loss -Unclear source -Abdominal x-ray does not show bleeding, FOBT negative -Hemoglobin currently 10.8, appears to be stable of the past several weeks -Continue to monitor CBC  Escherichia coli, Klebsiella UTI -Continue Keflex  Nutrition/dysphagia -Speech therapy recommended dysphagia 1 diet, patient currently on tube feeds -Patient did have some abdominal distention on 05/06/2017, abdominal film does not show ileus or obstruction -Continue to monitor  Peri-anal cellulitis with small abscess -Gen. surgery consulted and appreciated, determined neurosurgical drainage necessary -Patient was on clindamycin through 04/29/2017  DVT Prophylaxis  Eliquis   Code Status: Full  Family Communication: None at bedside  Disposition Plan: Admitted, pending SNF   Consultants PCCM Neurology Neurosurgery General surgery   Procedures  -LP 8/24 >> glucose 205, RBC 16, protein 62, WBC 5 -CT head 8/24 >> Large Rt MCA -8/25 decompressive craniectomy -EEG 8/27 >> bilateral cerebral dysfunction,  non-specific in  etiology -Echo 8/27 >> EF 65 to 70% -CT abd/pelvis 8/30 >> third spacing, 3.5 cm low attenuation Lt hepatic lobe -CT Head 9/3>> Severe right hemisphere cytotoxic edema with stable extension of edematous brain through the right craniectomy defect. Stable petechial hemorrhage without malignant hemorrhagicTransformation. Stable intracranial mass effect with leftward midline shift of 7 mm. Small left PCA territory infarct. -9/5 Trach placement.  -9/6 PEG tube placement.  -CXR 9/23 > LLL atx and small effusion -Barrium swallow 9/21 > severe oral and mod pharyngeal dysphagia; mod to severe aspiration risk -CT head on 10/1 shows Further swelling in the region of the complete right MCA territory infarction with more extensive bulging of the brain through the craniectomy defect. No midline shift.   Antibiotics   Anti-infectives    Start     Dose/Rate Route Frequency Ordered Stop   05/05/17 2200  cephALEXin (KEFLEX) capsule 500 mg     500 mg Oral Every 12 hours 05/05/17 1640     04/26/17 0600  vancomycin (VANCOCIN) IVPB 750 mg/150 ml premix  Status:  Discontinued     750 mg 150 mL/hr over 60 Minutes Intravenous Every 12 hours 04/25/17 1729 04/25/17 1730   04/25/17 1830  clindamycin (CLEOCIN) capsule 300 mg     300 mg Oral Every 6 hours 04/25/17 1730 04/29/17 1717   04/25/17 1800  vancomycin (VANCOCIN) IVPB 750 mg/150 ml premix  Status:  Discontinued     750 mg 150 mL/hr over 60 Minutes Intravenous Every 12 hours 04/25/17 1728 04/25/17 1729   04/23/17 0945  piperacillin-tazobactam (ZOSYN) IVPB 3.375 g  Status:  Discontinued     3.375 g 12.5 mL/hr over 240 Minutes Intravenous Every 8 hours 04/23/17 0930 04/25/17 1730   04/23/17 0430  vancomycin (VANCOCIN) IVPB 1000 mg/200 mL premix  Status:  Discontinued     1,000 mg 200 mL/hr over 60 Minutes Intravenous Every 12 hours 04/22/17 1655 04/25/17 1728   04/22/17 1630  vancomycin (VANCOCIN) IVPB 1000 mg/200 mL premix     1,000 mg 200 mL/hr over 60  Minutes Intravenous  Once 04/22/17 1627 04/22/17 1826   04/08/17 0200  piperacillin-tazobactam (ZOSYN) IVPB 3.375 g  Status:  Discontinued     3.375 g 12.5 mL/hr over 240 Minutes Intravenous Every 8 hours 04/08/17 0058 04/15/17 1601   04/08/17 0100  piperacillin-tazobactam (ZOSYN) IVPB 3.375 g  Status:  Discontinued     3.375 g 100 mL/hr over 30 Minutes Intravenous Every 6 hours 04/08/17 0054 04/08/17 0057   04/03/17 1200  ampicillin (OMNIPEN) 2 g in sodium chloride 0.9 % 50 mL IVPB     2 g 150 mL/hr over 20 Minutes Intravenous Every 6 hours 04/03/17 1005 04/06/17 1837   04/01/17 1800  vancomycin (VANCOCIN) IVPB 1000 mg/200 mL premix  Status:  Discontinued     1,000 mg 200 mL/hr over 60 Minutes Intravenous Every 24 hours 04/01/17 0146 04/01/17 0154   04/01/17 1600  ceFAZolin (ANCEF) IVPB 2g/100 mL premix     2 g 200 mL/hr over 30 Minutes Intravenous Every 8 hours 04/01/17 1100 04/02/17 0056   04/01/17 0828  bacitracin 50,000 Units in sodium chloride irrigation 0.9 % 500 mL irrigation  Status:  Discontinued       As needed 04/01/17 0837 04/01/17 0947   04/01/17 0600  piperacillin-tazobactam (ZOSYN) IVPB 3.375 g  Status:  Discontinued     3.375 g 12.5 mL/hr over 240 Minutes Intravenous Every 8 hours 04/01/17 0146 04/01/17 0154   04/01/17  0600  vancomycin (VANCOCIN) IVPB 750 mg/150 ml premix  Status:  Discontinued     750 mg 150 mL/hr over 60 Minutes Intravenous Every 12 hours 04/01/17 0225 04/03/17 0959   04/01/17 0400  piperacillin-tazobactam (ZOSYN) IVPB 3.375 g  Status:  Discontinued     3.375 g 12.5 mL/hr over 240 Minutes Intravenous Every 8 hours 04/01/17 0225 04/03/17 5361      Subjective:   Zimri Brennen seen and examined today.  Denies chest pain, shortness of breath, abdominal pain. Complains of headache.   Objective:   Vitals:   05/10/17 0846 05/10/17 0908 05/10/17 1220 05/10/17 1302  BP:  124/75  123/67  Pulse: 94 96  90  Resp: 19 20 18 20   Temp:  98.8 F (37.1 C)   98.2 F (36.8 C)  TempSrc:  Oral  Axillary  SpO2: 99% 98% 97% 98%  Weight:      Height:        Intake/Output Summary (Last 24 hours) at 05/10/17 1411 Last data filed at 05/10/17 1302  Gross per 24 hour  Intake              360 ml  Output             1700 ml  Net            -1340 ml   Filed Weights   04/25/17 0412 04/26/17 0416 04/26/17 2055  Weight: 88.4 kg (194 lb 14.2 oz) 85.5 kg (188 lb 7.9 oz) 88.7 kg (195 lb 8.8 oz)    Exam  General: Well developed, chronically ill appearing male, NAD  HEENT: NCAT,  mucous membranes moist.   Neck: Trach in place  Cardiovascular: S1 S2 auscultated, no rubs, murmurs or gallops. Regular rate and rhythm.  Respiratory: Diminished but clear  Abdomen: Soft, nontender, nondistended, + bowel sounds  Extremities: warm dry without cyanosis clubbing or edema  Neuro: AAOx3, no new deficits   Psych: Appropriate mood and affect   Data Reviewed: I have personally reviewed following labs and imaging studies  CBC:  Recent Labs Lab 05/04/17 0814 05/07/17 0017 05/10/17 0307  WBC 17.2* 13.1* 11.2*  HGB 11.5* 11.1* 10.8*  HCT 39.2 37.5* 36.4*  MCV 96.3 94.7 93.6  PLT 276 192 443   Basic Metabolic Panel:  Recent Labs Lab 05/07/17 0017  NA 135  K 4.5  CL 102  CO2 24  GLUCOSE 97  BUN 26*  CREATININE 0.80  CALCIUM 10.4*   GFR: Estimated Creatinine Clearance: 115.6 mL/min (by C-G formula based on SCr of 0.8 mg/dL). Liver Function Tests: No results for input(s): AST, ALT, ALKPHOS, BILITOT, PROT, ALBUMIN in the last 168 hours. No results for input(s): LIPASE, AMYLASE in the last 168 hours. No results for input(s): AMMONIA in the last 168 hours. Coagulation Profile: No results for input(s): INR, PROTIME in the last 168 hours. Cardiac Enzymes: No results for input(s): CKTOTAL, CKMB, CKMBINDEX, TROPONINI in the last 168 hours. BNP (last 3 results) No results for input(s): PROBNP in the last 8760 hours. HbA1C: No results for  input(s): HGBA1C in the last 72 hours. CBG:  Recent Labs Lab 05/09/17 1059 05/09/17 1637 05/09/17 2040 05/10/17 0609 05/10/17 1117  GLUCAP 149* 88 139* 147* 133*   Lipid Profile: No results for input(s): CHOL, HDL, LDLCALC, TRIG, CHOLHDL, LDLDIRECT in the last 72 hours. Thyroid Function Tests: No results for input(s): TSH, T4TOTAL, FREET4, T3FREE, THYROIDAB in the last 72 hours. Anemia Panel: No results for input(s): VITAMINB12, FOLATE,  FERRITIN, TIBC, IRON, RETICCTPCT in the last 72 hours. Urine analysis:    Component Value Date/Time   COLORURINE YELLOW 05/02/2017 1353   APPEARANCEUR CLOUDY (A) 05/02/2017 1353   LABSPEC 1.017 05/02/2017 1353   PHURINE 7.0 05/02/2017 1353   GLUCOSEU NEGATIVE 05/02/2017 1353   HGBUR NEGATIVE 05/02/2017 1353   BILIRUBINUR NEGATIVE 05/02/2017 1353   KETONESUR NEGATIVE 05/02/2017 1353   PROTEINUR 100 (A) 05/02/2017 1353   NITRITE NEGATIVE 05/02/2017 1353   LEUKOCYTESUR NEGATIVE 05/02/2017 1353   Sepsis Labs: @LABRCNTIP (procalcitonin:4,lacticidven:4)  ) Recent Results (from the past 240 hour(s))  Culture, Urine     Status: Abnormal   Collection Time: 05/02/17  1:53 PM  Result Value Ref Range Status   Specimen Description URINE, RANDOM  Final   Special Requests NONE  Final   Culture (A)  Final    >=100,000 COLONIES/mL KLEBSIELLA PNEUMONIAE >=100,000 COLONIES/mL ESCHERICHIA COLI    Report Status 05/05/2017 FINAL  Final   Organism ID, Bacteria KLEBSIELLA PNEUMONIAE (A)  Final   Organism ID, Bacteria ESCHERICHIA COLI (A)  Final      Susceptibility   Escherichia coli - MIC*    AMPICILLIN >=32 RESISTANT Resistant     CEFAZOLIN <=4 SENSITIVE Sensitive     CEFTRIAXONE <=1 SENSITIVE Sensitive     CIPROFLOXACIN >=4 RESISTANT Resistant     GENTAMICIN <=1 SENSITIVE Sensitive     IMIPENEM <=0.25 SENSITIVE Sensitive     NITROFURANTOIN <=16 SENSITIVE Sensitive     TRIMETH/SULFA >=320 RESISTANT Resistant     AMPICILLIN/SULBACTAM >=32 RESISTANT  Resistant     PIP/TAZO <=4 SENSITIVE Sensitive     Extended ESBL NEGATIVE Sensitive     * >=100,000 COLONIES/mL ESCHERICHIA COLI   Klebsiella pneumoniae - MIC*    AMPICILLIN >=32 RESISTANT Resistant     CEFAZOLIN <=4 SENSITIVE Sensitive     CEFTRIAXONE <=1 SENSITIVE Sensitive     CIPROFLOXACIN <=0.25 SENSITIVE Sensitive     GENTAMICIN <=1 SENSITIVE Sensitive     IMIPENEM <=0.25 SENSITIVE Sensitive     NITROFURANTOIN 64 INTERMEDIATE Intermediate     TRIMETH/SULFA <=20 SENSITIVE Sensitive     AMPICILLIN/SULBACTAM 4 SENSITIVE Sensitive     PIP/TAZO <=4 SENSITIVE Sensitive     Extended ESBL NEGATIVE Sensitive     * >=100,000 COLONIES/mL KLEBSIELLA PNEUMONIAE      Radiology Studies: No results found.   Scheduled Meds: . acetaminophen (TYLENOL) oral liquid 160 mg/5 mL  650 mg Per Tube TID  . apixaban  5 mg Oral BID  . cephALEXin  500 mg Oral Q12H  . chlorhexidine  15 mL Mouth Rinse BID  . feeding supplement (PRO-STAT SUGAR FREE 64)  30 mL Per Tube BID  . free water  250 mL Per Tube Q8H  . gabapentin  300 mg Oral QHS  . glycopyrrolate  1 mg Per Tube BID  . insulin aspart  0-20 Units Subcutaneous TID WC  . insulin aspart  0-5 Units Subcutaneous QHS  . insulin detemir  34 Units Subcutaneous Daily  . metoprolol tartrate  12.5 mg Per Tube BID  . pantoprazole sodium  40 mg Per Tube Daily   Continuous Infusions: . feeding supplement (GLUCERNA 1.2 CAL) 1,000 mL (05/10/17 1103)     LOS: 39 days   Time Spent in minutes   30 minutes  Cherity Blickenstaff D.O. on 05/10/2017 at 2:11 PM  Between 7am to 7pm - Pager - [phone]  After 7pm go to [email or web address] - password [email or web address]  And look for the night  coverage person covering for me after hours  [facility] Office  [phone]  

## 2017-05-10 NOTE — Progress Notes (Signed)
Occupational Therapy Treatment Patient Details Name: Joe Wilkinson MRN: [id number] DOB: [birth year] Today's Date: 05/10/2017    History of present illness 56 yo male brought to [facility] with altered mental status from DKA and sepsis.  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  Required Rt decompressive craniectomy and remained on vent post op.ETT 8/23-9/5, s/p tracheostomy 9/5   OT comments  Pt progressing towards established OT goals. Focused session on dynamic sitting balance using PNF patterns for functional task. Pt requiring to locate items on L side an attend to L side of body and space. Initially pt requiring Max A to gain sitting balance at EOB, and progressed Min A and Mod VCs to correct sitting balance. Continue to recommend dc to post-acute rehab to optimize safety and independence with ADLs. Will continue to follow acutely to facilitate safe dc.    Follow Up Recommendations  SNF;Supervision/Assistance - 24 hour    Equipment Recommendations  Other (comment) (Defer to next venue)    Recommendations for Other Services      Precautions / Restrictions Precautions Precautions: Fall;Other (comment) Precaution Comments: craniectomy, no bone flap right side; trach in place; peg tube; L side neglect Restrictions Weight Bearing Restrictions: No Other Position/Activity Restrictions: craniectomy, no bone flap right side       Mobility Bed Mobility Overal bed mobility: Needs Assistance Bed Mobility: Supine to Sit;Sit to Supine Rolling: Max assist;+2 for physical assistance   Supine to sit: Max assist;+2 for physical assistance;HOB elevated Sit to supine: +2 for physical assistance;Total assist   General bed mobility comments: pt able to use R UE to assist, able to move R LE off of bed, assist to elevate trunk and with L LE movement, use of bed pads to position pt's hips at EOB  Transfers                 General transfer comment: focus of session was on sitting balance  with trunk rotation and use of PNF patterns for functional tasks    Balance Overall balance assessment: Needs assistance Sitting-balance support: Feet supported;Single extremity supported Sitting balance-Leahy Scale: Poor Sitting balance - Comments: pt required min-max A to maintain sitting EOB; pt with L lateral lean and pushing with R UE Postural control: Left lateral lean;Posterior lean                                 ADL either performed or assessed with clinical judgement   ADL Overall ADL's : Needs assistance/impaired Eating/Feeding: NPO               Upper Body Dressing : Moderate assistance;Sitting;Cueing for sequencing Upper Body Dressing Details (indicate cue type and reason): Pt requiring Max verbal and visual cues to pick up LUE with r hand to don L arm first with new gown.                   General ADL Comments: Pt donned new gown at EOB with Max cues and Mod A. Pt particiapting in theraputic acitivty using functional diagonals and trunk rotation.      Vision   Vision Assessment?: Vision impaired- to be further tested in functional context   Perception     Praxis      Cognition Arousal/Alertness: Awake/alert Behavior During Therapy: Flat affect Overall Cognitive Status: Impaired/Different from baseline Area of Impairment: Problem solving;Awareness;Safety/judgement;Following commands;Attention;Memory  Current Attention Level: Sustained Memory: Decreased recall of precautions;Decreased short-term memory Following Commands: Follows one step commands with increased time;Follows one step commands inconsistently;Follows multi-step commands inconsistently;Follows multi-step commands with increased time Safety/Judgement: Decreased awareness of safety;Decreased awareness of deficits Awareness: Intellectual Problem Solving: Slow processing;Decreased initiation;Difficulty sequencing;Requires verbal cues;Requires tactile  cues (multimodal cueing) General Comments: Used PMV during session. Pt less verbal then at previous session and prefered head nods. Pt continuing increase cues to engage in tasks and correct sitting balace.  Following two step activity with Min VCs to initate task.        Exercises Exercises: Other exercises Other Exercises Other Exercises: trunk control and rotation with weight shifting in sitting with tactile facilitation with min-max A to complete Other Exercises: Two step activity using PNF diagonal patterns to reach RUE towards far L bottom corner and then bring items to L side of bed. Requiring pt to attend to L side and locate items. Other Exercises: weight bearing through L UE with sitting balance tasks with facilitation from therapist   Shoulder Instructions       General Comments      Pertinent Vitals/ Pain       Pain Assessment: Faces Faces Pain Scale: Hurts little more Pain Location: R side of head Pain Descriptors / Indicators: Throbbing Pain Intervention(s): Monitored during session;Repositioned;Limited activity within patient's tolerance  Home Living                                          Prior Functioning/Environment              Frequency  Min 2X/week        Progress Toward Goals  OT Goals(current goals can now be found in the care plan section)  Progress towards OT goals: Progressing toward goals  Acute Rehab OT Goals Patient Stated Goal: none stated OT Goal Formulation: Patient unable to participate in goal setting ADL Goals Pt Will Perform Grooming: with min assist;sitting Additional ADL Goal #1: Pt will perform bed mobility as precursor to ADL at min A level Additional ADL Goal #2: Pt will track items with multimodal cues past mdline 50% of the time Additional ADL Goal #3: Pt will tolerate sitting EOB with min A as precursor to performing ADL  Plan Discharge plan remains appropriate    Co-evaluation    PT/OT/SLP  Co-Evaluation/Treatment: Yes Reason for Co-Treatment: To address functional/ADL transfers PT goals addressed during session: Strengthening/ROM OT goals addressed during session: Strengthening/ROM;ADL's and self-care      AM-PAC PT "6 Clicks" Daily Activity     Outcome Measure   Help from another person eating meals?: A Lot Help from another person taking care of personal grooming?: A Lot Help from another person toileting, which includes using toliet, bedpan, or urinal?: Total Help from another person bathing (including washing, rinsing, drying)?: Total Help from another person to put on and taking off regular upper body clothing?: A Lot Help from another person to put on and taking off regular lower body clothing?: Total 6 Click Score: 9    End of Session Equipment Utilized During Treatment: Oxygen  OT Visit Diagnosis: Unsteadiness on feet (R26.81);Muscle weakness (generalized) (M62.81);Low vision, both eyes (H54.2);Other symptoms and signs involving the nervous system (R29.898);Other symptoms and signs involving cognitive function;Hemiplegia and hemiparesis;Pain Hemiplegia - Right/Left: Left Hemiplegia - dominant/non-dominant: Non-Dominant Hemiplegia - caused by: Cerebral infarction Pain - Right/Left:  Right Pain - part of body:  (head)   Activity Tolerance Patient tolerated treatment well   Patient Left in bed;with call bell/phone within reach;with bed alarm set   Nurse Communication Mobility status;Other (comment) (Condom cath off)        Time: 2081-3887 OT Time Calculation (min): 38 min  Charges: OT General Charges $OT Visit: 1 Visit OT Treatments $Therapeutic Activity: 8-22 mins  [place], OTR/L Acute Rehab Pager: [phone] Office: [place] 05/10/2017, 5:23 PM

## 2017-05-10 NOTE — Progress Notes (Signed)
Physical Therapy Treatment Patient Details Name: Joe Wilkinson MRN: [id number] DOB: [birth year] Today's Date: 05/10/2017    History of Present Illness 56 yo male brought to [facility] with altered mental status from DKA and sepsis.  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  Required Rt decompressive craniectomy and remained on vent post op.ETT 8/23-9/5, s/p tracheostomy 9/5    PT Comments    Focus of session was trunk control, trunk strengthening with rotation and incorporation of PNF patterns for functional tasks. Pt continues to be limited secondary to L sided weakness and fatigue. PMV was utilized throughout session as well. Pt would continue to benefit from skilled physical therapy services at this time while admitted and after d/c to address the below listed limitations in order to improve overall safety and independence with functional mobility.    Follow Up Recommendations  SNF;Supervision/Assistance - 24 hour     Equipment Recommendations  None recommended by PT    Recommendations for Other Services       Precautions / Restrictions Precautions Precautions: Fall;Other (comment) Precaution Comments: craniectomy, no bone flap right side; trach in place; peg tube; L side neglect Restrictions Weight Bearing Restrictions: No    Mobility  Bed Mobility Overal bed mobility: Needs Assistance Bed Mobility: Supine to Sit;Sit to Supine Rolling: Max assist;Total assist;+2 for physical assistance   Supine to sit: Max assist;+2 for physical assistance;HOB elevated Sit to supine: +2 for physical assistance;Total assist   General bed mobility comments: pt able to use R UE to assist, able to move R LE off of bed, assist to elevate trunk and with L LE movement, use of bed pads to position pt's hips at EOB  Transfers                 General transfer comment: focus of session was on sitting balance with trunk rotation and use of PNF patterns for functional tasks  Ambulation/Gait                 Stairs            Wheelchair Mobility    Modified Rankin (Stroke Patients Only) Modified Rankin (Stroke Patients Only) Pre-Morbid Rankin Score: No symptoms Modified Rankin: Severe disability     Balance Overall balance assessment: Needs assistance Sitting-balance support: Feet supported;Single extremity supported Sitting balance-Leahy Scale: Poor Sitting balance - Comments: pt required min-max A to maintain sitting EOB; pt with L lateral lean and pushing with R UE Postural control: Left lateral lean;Posterior lean                                  Cognition Arousal/Alertness: Awake/alert Behavior During Therapy: Flat affect Overall Cognitive Status: Impaired/Different from baseline Area of Impairment: Problem solving;Awareness;Safety/judgement;Following commands;Attention;Memory                   Current Attention Level: Sustained Memory: Decreased recall of precautions;Decreased short-term memory Following Commands: Follows one step commands with increased time;Follows one step commands inconsistently;Follows multi-step commands inconsistently;Follows multi-step commands with increased time Safety/Judgement: Decreased awareness of safety;Decreased awareness of deficits Awareness: Intellectual Problem Solving: Slow processing;Decreased initiation;Difficulty sequencing;Requires verbal cues;Requires tactile cues (multimodal cueing) General Comments: use of PMV during session; pt still preferring to nod/shake head and required cueing to use his voice      Exercises Other Exercises Other Exercises: trunk control and rotation with weight shifting in sitting with tactile facilitation with  min-max A to complete Other Exercises: PROM L LE at all joints, in all planes of motion Other Exercises: weight bearing through L UE with sitting balance tasks with facilitation from therapist    General Comments        Pertinent Vitals/Pain  Pain Assessment: Faces Faces Pain Scale: Hurts little more Pain Location: R side of head Pain Descriptors / Indicators: Throbbing Pain Intervention(s): Monitored during session;Premedicated before session;Repositioned    Home Living                      Prior Function            PT Goals (current goals can now be found in the care plan section) Acute Rehab PT Goals PT Goal Formulation: Patient unable to participate in goal setting Time For Goal Achievement: 05/18/17 Potential to Achieve Goals: Fair Progress towards PT goals: Progressing toward goals    Frequency    Min 3X/week      PT Plan Current plan remains appropriate    Co-evaluation PT/OT/SLP Co-Evaluation/Treatment: Yes Reason for Co-Treatment: To address functional/ADL transfers;For patient/therapist safety;Complexity of the patient's impairments (multi-system involvement) PT goals addressed during session: Strengthening/ROM;Proper use of DME;Balance;Mobility/safety with mobility        AM-PAC PT "6 Clicks" Daily Activity  Outcome Measure  Difficulty turning over in bed (including adjusting bedclothes, sheets and blankets)?: Unable Difficulty moving from lying on back to sitting on the side of the bed? : Unable Difficulty sitting down on and standing up from a chair with arms (e.g., wheelchair, bedside commode, etc,.)?: Unable Help needed moving to and from a bed to chair (including a wheelchair)?: Total Help needed walking in hospital room?: Total Help needed climbing 3-5 steps with a railing? : Total 6 Click Score: 6    End of Session Equipment Utilized During Treatment: Oxygen;Other (comment) (5L trach collar) Activity Tolerance: Patient tolerated treatment well Patient left: in bed;with call bell/phone within reach;with bed alarm set;with SCD's reapplied Nurse Communication: Mobility status;Need for lift equipment PT Visit Diagnosis: Other symptoms and signs involving the nervous system  (R29.898);Hemiplegia and hemiparesis Hemiplegia - Right/Left: Left Hemiplegia - dominant/non-dominant: Non-dominant Hemiplegia - caused by: Nontraumatic intracerebral hemorrhage     Time: 5093-2671 PT Time Calculation (min) (ACUTE ONLY): 40 min  Charges:  $Therapeutic Activity: 23-37 mins                    G Codes:       [place], PT, DPT [phone]    [place] 05/10/2017, 5:00 PM

## 2017-05-11 DIAGNOSIS — Q321 Other congenital malformations of trachea: Secondary | ICD-10-CM

## 2017-05-11 DIAGNOSIS — J9621 Acute and chronic respiratory failure with hypoxia: Secondary | ICD-10-CM

## 2017-05-11 LAB — BASIC METABOLIC PANEL
Anion gap: 9 (ref 5–15)
BUN: 28 mg/dL — ABNORMAL HIGH (ref 6–20)
CO2: 26 mmol/L (ref 22–32)
Calcium: 10.5 mg/dL — ABNORMAL HIGH (ref 8.9–10.3)
Chloride: 101 mmol/L (ref 101–111)
Creatinine, Ser: 0.72 mg/dL (ref 0.61–1.24)
GFR calc Af Amer: >60 mL/min (ref >60)
GFR calc non Af Amer: >60 mL/min (ref >60)
Glucose, Bld: 105 mg/dL — ABNORMAL HIGH (ref 65–99)
Potassium: 4.9 mmol/L (ref 3.5–5.1)
Sodium: 136 mmol/L (ref 135–145)

## 2017-05-11 LAB — GLUCOSE, CAPILLARY
GLUCOSE-CAPILLARY: 121 mg/dL — AB (ref 65–99)
GLUCOSE-CAPILLARY: 162 mg/dL — AB (ref 65–99)
Glucose-Capillary: 133 mg/dL — ABNORMAL HIGH (ref 65–99)
Glucose-Capillary: 128 mg/dL — ABNORMAL HIGH (ref 65–99)

## 2017-05-11 LAB — CBC
HCT: 38.2 % — ABNORMAL LOW (ref 39.0–52.0)
Hemoglobin: 11.2 g/dL — ABNORMAL LOW (ref 13.0–17.0)
MCH: 27.3 pg (ref 26.0–34.0)
MCHC: 29.3 g/dL — ABNORMAL LOW (ref 30.0–36.0)
MCV: 92.9 fL (ref 78.0–100.0)
PLATELETS: 203 10*3/uL (ref 150–400)
RBC: 4.11 MIL/uL — AB (ref 4.22–5.81)
RDW: 16.4 % — ABNORMAL HIGH (ref 11.5–15.5)
WBC: 13 10*3/uL — AB (ref 4.0–10.5)

## 2017-05-11 NOTE — Care Management Note (Signed)
Case Management Note  Patient Details  Name: Joe Wilkinson MRN: [id number] Date of Birth: 1960-09-23  Subjective/Objective:                    Action/Plan: Not ready for decannulation per PCCM. Pt continues to be on difficult to place SNF list. CM following.  Expected Discharge Date:  04/28/17               Expected Discharge Plan:  [place]  In-House Referral:  Clinical Social Work, Scientist, research (medical)  CM Consult  Post Acute Care Choice:    Choice offered to:     DME Arranged:    DME Agency:     HH Arranged:    [place] Agency:     Status of Service:  In process, will continue to follow  If discussed at Long Length of Stay Meetings, dates discussed:    Additional Comments:  Pollie Friar, RN 05/11/2017, 3:44 PM

## 2017-05-11 NOTE — Progress Notes (Signed)
  Speech Language Pathology Treatment: Dysphagia;Cognitive-Linquistic  Patient Details Name: Joe Wilkinson MRN: [id number] DOB: 1961/07/18 Today's Date: 05/11/2017 Time: 0850-0909 SLP Time Calculation (min) (ACUTE ONLY): 19 min  Assessment / Plan / Recommendation Clinical Impression  Pt demonstrates improving interaction with SLP today. Able to tolerate PMSV for 20 minutes with no difficulty, no secretions observed. Pt verbalized at phrase level with min question cues, most responsive to social language with speaker on the right. Pt less responsive to speaker on the left, needed increased tactile, visual contextual cues for verbal and functional tasks. Pt requested bacon, so SLP offered trials with full assist to removed pocketed meat in the left buccal cavity. Though pt enjoyed bacon he is clearly not able to tolerate upgraded textures. Will continue efforts. Pt may wear PMSV with intermittent supervision during all waking hours.   HPI HPI: 56 yo male brought to [facility] with altered mental status from DKA and sepsis. 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 Required Rt decompressive craniectomy and remained on vent post op.ETT 8/23-9/5, s/p tracheostomy 9/5.       SLP Plan          Recommendations         Patient may use Passy-Muir Speech Valve: During all waking hours (remove during sleep) PMSV Supervision: Intermittent         Oral Care Recommendations: Oral care BID Follow up Recommendations: Skilled Nursing facility SLP Visit Diagnosis: Dysphagia, oropharyngeal phase (R13.12)       GO               Herbie [place], MA CCC-SLP [phone]  Lynann Beaver 05/11/2017, 9:30 AM

## 2017-05-11 NOTE — Progress Notes (Signed)
PROGRESS NOTE    Joe Wilkinson  OYD:[id number] DOB: [birth year] DOA: 03/31/2017 PCP: Patient, No Pcp Per   No chief complaint on file.   Brief Narrative:  HPI on 04/01/2017 by Dr, Hanley Hays (PCCM) [phone] M found unresponsive at bustop admitted to [facility] in DKA, concern for sepsis w/o source, later developed hemiparesis and found to have acute Right MCA CVA.  Now transferred to [facility] ICU for NeuroSx care, seen by NSX and Neuro.  Tentative plan for crani in AM. He is intubated and sedated and unable to provide any HPI, ROS, PMH, PSH, FH, home meds, allergies.  All Hx form medical record.  Interim history  He was transferred to the ICU and underwent decompressive right craniectomy 8/25. Hypertonic saline protocol was completed. He required transfusion support and was treated for AFib with RVR, continued on the ventilator, eventually receiving trach 9/5 and PEG 9/6, on ATC 9/7. Patient does not have health insurance and because of his craniectomy without the ability to fit a helmet, most skilled nursing facilities areunwilling to accept him.  Currently he is getting meds thru PEG and continuous TF. Heparin was converted to prophylaxis Lovenox. He was transferred from PCCm service to [facility] service on 9/25. Currently awaiting placement to snf.  Assessment & Plan   Acute respiratory failure with hypoxia and hypercarbia  -Continue pulmonary hygiene  -s/p trach -Seen by PCCM, did not feel patient was ready for decannulation given his aspiration risks and suctioning requirements -Continue ATC as tolerated  Septic shock secondary to S. 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 Leonie Man, recommended anticoagulation (FOBT negative), started on Eliquis  -LDL 38, hemoglobin A1C 14.3 -Repeat CT had showed further swelling in the region of the complete right MCA territory infarction with more extensive bulging of the brain through the craniectomy defect. No midline shift however because of decompressive craniotomy. At superior margin, there could be some extension of the swollen brain through a dural defect. At the superior margin, small amount of petechial bleeding within the region of the infarction. -PT/OT rec SNF  Atrial fibrillation with RVR, new onset -Currently in sinus rhythm -CHADSVASC 4 -Patient was not placed on anticoagulation as he was found to be anemic and required 4 units PRBC transfusion this admission -Continue Eliquis and metoprolol  Diabetes mellitus, type II -Hemoglobin A1c 14.3 -Continue Levemir, insulin sliding scale CBG monitoring  Anemia blood loss -Unclear source -Abdominal x-ray does not show bleeding, FOBT negative -Hemoglobin currently 11.2, appears to be stable of the past several weeks -Continue to monitor CBC  Escherichia coli, Klebsiella UTI -Continue Keflex  Nutrition/dysphagia -Speech therapy recommended dysphagia 1 diet, patient currently on tube feeds -Patient did have some abdominal distention on 05/06/2017, abdominal film does not show ileus or obstruction -Continue to monitor  Peri-anal cellulitis with small abscess -Gen. surgery consulted and appreciated, determined neurosurgical drainage necessary -Patient was on clindamycin through 04/29/2017  DVT Prophylaxis  Eliquis   Code Status: Full  Family Communication: None at bedside  Disposition Plan: Admitted, pending SNF   Consultants PCCM Neurology Neurosurgery General surgery   Procedures  -LP 8/24 >> glucose 205, RBC 16, protein 62, WBC 5 -  CT head 8/24  >> Large Rt MCA -8/25 decompressive craniectomy -EEG 8/27 >> bilateral cerebral dysfunction, non-specific in etiology -Echo 8/27 >> EF 65 to 70% -CT abd/pelvis 8/30 >> third spacing, 3.5 cm low attenuation Lt hepatic lobe -CT Head 9/3>> Severe right hemisphere cytotoxic edema with stable extension of edematous brain through the right craniectomy defect. Stable petechial hemorrhage without malignant hemorrhagicTransformation. Stable intracranial mass effect with leftward midline shift of 7 mm. Small left PCA territory infarct. -9/5 Trach placement.  -9/6 PEG tube placement.  -CXR 9/23 > LLL atx and small effusion -Barrium swallow 9/21 > severe oral and mod pharyngeal dysphagia; mod to severe aspiration risk -CT head on 10/1 shows Further swelling in the region of the complete right MCA territory infarction with more extensive bulging of the brain through the craniectomy defect. No midline shift.   Antibiotics   Anti-infectives    Start     Dose/Rate Route Frequency Ordered Stop   05/05/17 2200  cephALEXin (KEFLEX) capsule 500 mg     500 mg Oral Every 12 hours 05/05/17 1640     04/26/17 0600  vancomycin (VANCOCIN) IVPB 750 mg/150 ml premix  Status:  Discontinued     750 mg 150 mL/hr over 60 Minutes Intravenous Every 12 hours 04/25/17 1729 04/25/17 1730   04/25/17 1830  clindamycin (CLEOCIN) capsule 300 mg     300 mg Oral Every 6 hours 04/25/17 1730 04/29/17 1717   04/25/17 1800  vancomycin (VANCOCIN) IVPB 750 mg/150 ml premix  Status:  Discontinued     750 mg 150 mL/hr over 60 Minutes Intravenous Every 12 hours 04/25/17 1728 04/25/17 1729   04/23/17 0945  piperacillin-tazobactam (ZOSYN) IVPB 3.375 g  Status:  Discontinued     3.375 g 12.5 mL/hr over 240 Minutes Intravenous Every 8 hours 04/23/17 0930 04/25/17 1730   04/23/17 0430  vancomycin (VANCOCIN) IVPB 1000 mg/200 mL premix  Status:  Discontinued     1,000 mg 200 mL/hr over 60 Minutes Intravenous Every 12 hours 04/22/17 1655 04/25/17  1728   04/22/17 1630  vancomycin (VANCOCIN) IVPB 1000 mg/200 mL premix     1,000 mg 200 mL/hr over 60 Minutes Intravenous  Once 04/22/17 1627 04/22/17 1826   04/08/17 0200  piperacillin-tazobactam (ZOSYN) IVPB 3.375 g  Status:  Discontinued     3.375 g 12.5 mL/hr over 240 Minutes Intravenous Every 8 hours 04/08/17 0058 04/15/17 1601   04/08/17 0100  piperacillin-tazobactam (ZOSYN) IVPB 3.375 g  Status:  Discontinued     3.375 g 100 mL/hr over 30 Minutes Intravenous Every 6 hours 04/08/17 0054 04/08/17 0057   04/03/17 1200  ampicillin (OMNIPEN) 2 g in sodium chloride 0.9 % 50 mL IVPB     2 g 150 mL/hr over 20 Minutes Intravenous Every 6 hours 04/03/17 1005 04/06/17 1837   04/01/17 1800  vancomycin (VANCOCIN) IVPB 1000 mg/200 mL premix  Status:  Discontinued     1,000 mg 200 mL/hr over 60 Minutes Intravenous Every 24 hours 04/01/17 0146 04/01/17 0154   04/01/17 1600  ceFAZolin (ANCEF) IVPB 2g/100 mL premix     2 g 200 mL/hr over 30 Minutes Intravenous Every 8 hours 04/01/17 1100 04/02/17 0056   04/01/17 0828  bacitracin 50,000 Units in sodium chloride irrigation 0.9 % 500 mL irrigation  Status:  Discontinued       As needed 04/01/17 0837 04/01/17 0947   04/01/17 0600  piperacillin-tazobactam (ZOSYN) IVPB 3.375 g  Status:  Discontinued     3.375 g  12.5 mL/hr over 240 Minutes Intravenous Every 8 hours 04/01/17 0146 04/01/17 0154   04/01/17 0600  vancomycin (VANCOCIN) IVPB 750 mg/150 ml premix  Status:  Discontinued     750 mg 150 mL/hr over 60 Minutes Intravenous Every 12 hours 04/01/17 0225 04/03/17 0959   04/01/17 0400  piperacillin-tazobactam (ZOSYN) IVPB 3.375 g  Status:  Discontinued     3.375 g 12.5 mL/hr over 240 Minutes Intravenous Every 8 hours 04/01/17 0225 04/03/17 1740      Subjective:   Amara Manalang seen and examined today.  Complains of continued headache. Denies chest pain, shortness of breath, abdominal pain, nausea, vomiting. Has occasional cough.   Objective:    Vitals:   05/11/17 0310 05/11/17 0439 05/11/17 0858 05/11/17 1025  BP:  100/62  (!) 143/69  Pulse: 79 76 91 97  Resp:  18 16 16   Temp:  98.3 F (36.8 C)  98.5 F (36.9 C)  TempSrc:  Axillary  Oral  SpO2: 97% 98% 98% 99%  Weight:      Height:        Intake/Output Summary (Last 24 hours) at 05/11/17 1156 Last data filed at 05/11/17 0842  Gross per 24 hour  Intake              300 ml  Output             1950 ml  Net            -1650 ml   Filed Weights   04/25/17 0412 04/26/17 0416 04/26/17 2055  Weight: 88.4 kg (194 lb 14.2 oz) 85.5 kg (188 lb 7.9 oz) 88.7 kg (195 lb 8.8 oz)    Exam  General: Well developed, chronically ill appearing, NAD  HEENT: NCAT, mucous membranes moist. Right craniectomy   Neck: Trach in place  Cardiovascular: S1 S2 auscultated, RRR, no murmurs  Respiratory: Diminished but clear  Abdomen: Soft, nontender, nondistended, + bowel sounds  Extremities: warm dry without cyanosis clubbing or edema  Neuro: AAOx3, no new deficits  Psych: Appropriate  Data Reviewed: I have personally reviewed following labs and imaging studies  CBC:  Recent Labs Lab 05/07/17 0017 05/10/17 0307 05/11/17 0258  WBC 13.1* 11.2* 13.0*  HGB 11.1* 10.8* 11.2*  HCT 37.5* 36.4* 38.2*  MCV 94.7 93.6 92.9  PLT 192 208 814   Basic Metabolic Panel:  Recent Labs Lab 05/07/17 0017 05/11/17 0258  NA 135 136  K 4.5 4.9  CL 102 101  CO2 24 26  GLUCOSE 97 105*  BUN 26* 28*  CREATININE 0.80 0.72  CALCIUM 10.4* 10.5*   GFR: Estimated Creatinine Clearance: 115.6 mL/min (by C-G formula based on SCr of 0.72 mg/dL). Liver Function Tests: No results for input(s): AST, ALT, ALKPHOS, BILITOT, PROT, ALBUMIN in the last 168 hours. No results for input(s): LIPASE, AMYLASE in the last 168 hours. No results for input(s): AMMONIA in the last 168 hours. Coagulation Profile: No results for input(s): INR, PROTIME in the last 168 hours. Cardiac Enzymes: No results for  input(s): CKTOTAL, CKMB, CKMBINDEX, TROPONINI in the last 168 hours. BNP (last 3 results) No results for input(s): PROBNP in the last 8760 hours. HbA1C: No results for input(s): HGBA1C in the last 72 hours. CBG:  Recent Labs Lab 05/10/17 1117 05/10/17 1724 05/10/17 2136 05/11/17 0612 05/11/17 1123  GLUCAP 133* 114* 99 121* 162*   Lipid Profile: No results for input(s): CHOL, HDL, LDLCALC, TRIG, CHOLHDL, LDLDIRECT in the last 72 hours. Thyroid Function Tests:  No results for input(s): TSH, T4TOTAL, FREET4, T3FREE, THYROIDAB in the last 72 hours. Anemia Panel: No results for input(s): VITAMINB12, FOLATE, FERRITIN, TIBC, IRON, RETICCTPCT in the last 72 hours. Urine analysis:    Component Value Date/Time   COLORURINE YELLOW 05/02/2017 1353   APPEARANCEUR CLOUDY (A) 05/02/2017 1353   LABSPEC 1.017 05/02/2017 1353   PHURINE 7.0 05/02/2017 1353   GLUCOSEU NEGATIVE 05/02/2017 1353   HGBUR NEGATIVE 05/02/2017 1353   BILIRUBINUR NEGATIVE 05/02/2017 1353   KETONESUR NEGATIVE 05/02/2017 1353   PROTEINUR 100 (A) 05/02/2017 1353   NITRITE NEGATIVE 05/02/2017 1353   LEUKOCYTESUR NEGATIVE 05/02/2017 1353   Sepsis Labs: @LABRCNTIP (procalcitonin:4,lacticidven:4)  ) Recent Results (from the past 240 hour(s))  Culture, Urine     Status: Abnormal   Collection Time: 05/02/17  1:53 PM  Result Value Ref Range Status   Specimen Description URINE, RANDOM  Final   Special Requests NONE  Final   Culture (A)  Final    >=100,000 COLONIES/mL KLEBSIELLA PNEUMONIAE >=100,000 COLONIES/mL ESCHERICHIA COLI    Report Status 05/05/2017 FINAL  Final   Organism ID, Bacteria KLEBSIELLA PNEUMONIAE (A)  Final   Organism ID, Bacteria ESCHERICHIA COLI (A)  Final      Susceptibility   Escherichia coli - MIC*    AMPICILLIN >=32 RESISTANT Resistant     CEFAZOLIN <=4 SENSITIVE Sensitive     CEFTRIAXONE <=1 SENSITIVE Sensitive     CIPROFLOXACIN >=4 RESISTANT Resistant     GENTAMICIN <=1 SENSITIVE Sensitive      IMIPENEM <=0.25 SENSITIVE Sensitive     NITROFURANTOIN <=16 SENSITIVE Sensitive     TRIMETH/SULFA >=320 RESISTANT Resistant     AMPICILLIN/SULBACTAM >=32 RESISTANT Resistant     PIP/TAZO <=4 SENSITIVE Sensitive     Extended ESBL NEGATIVE Sensitive     * >=100,000 COLONIES/mL ESCHERICHIA COLI   Klebsiella pneumoniae - MIC*    AMPICILLIN >=32 RESISTANT Resistant     CEFAZOLIN <=4 SENSITIVE Sensitive     CEFTRIAXONE <=1 SENSITIVE Sensitive     CIPROFLOXACIN <=0.25 SENSITIVE Sensitive     GENTAMICIN <=1 SENSITIVE Sensitive     IMIPENEM <=0.25 SENSITIVE Sensitive     NITROFURANTOIN 64 INTERMEDIATE Intermediate     TRIMETH/SULFA <=20 SENSITIVE Sensitive     AMPICILLIN/SULBACTAM 4 SENSITIVE Sensitive     PIP/TAZO <=4 SENSITIVE Sensitive     Extended ESBL NEGATIVE Sensitive     * >=100,000 COLONIES/mL KLEBSIELLA PNEUMONIAE      Radiology Studies: No results found.   Scheduled Meds: . acetaminophen (TYLENOL) oral liquid 160 mg/5 mL  650 mg Per Tube TID  . apixaban  5 mg Oral BID  . cephALEXin  500 mg Oral Q12H  . chlorhexidine  15 mL Mouth Rinse BID  . feeding supplement (PRO-STAT SUGAR FREE 64)  30 mL Per Tube BID  . free water  250 mL Per Tube Q8H  . gabapentin  300 mg Oral QHS  . glycopyrrolate  1 mg Per Tube BID  . insulin aspart  0-20 Units Subcutaneous TID WC  . insulin aspart  0-5 Units Subcutaneous QHS  . insulin detemir  34 Units Subcutaneous Daily  . metoprolol tartrate  12.5 mg Per Tube BID  . pantoprazole sodium  40 mg Per Tube Daily   Continuous Infusions: . feeding supplement (GLUCERNA 1.2 CAL) 1,000 mL (05/11/17 0609)     LOS: 40 days   Time Spent in minutes   30 minutes  Klani Caridi D.O. on 05/11/2017 at 11:56 AM  Between  7am to 7pm - Pager - [phone]  After 7pm go to [email or web address] - password [email or web address]  And look for the night coverage person covering for me after hours  [facility] Office  [phone]

## 2017-05-11 NOTE — Discharge Instructions (Signed)

## 2017-05-11 NOTE — Progress Notes (Addendum)
PULMONARY / CRITICAL CARE MEDICINE   Name: Joe Wilkinson MRN: [id number] DOB: [birth year]    ADMISSION DATE:  03/31/2017 CHIEF COMPLAINT:  CVA  HISTORY OF PRESENT ILLNESS:   56 yo male brought to [facility] with altered mental status from DKA and sepsis.  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  Required Rt decompressive craniectomy and remained on vent post op.  SUBJECTIVE:  Pt reports occasional cough.  SLP notes reviewed (pt pocketing food), tolerating PMV.  28% ATC.  Continues to require suctioning.   VITAL SIGNS: BP 100/62 (BP Location: Left Arm)   Pulse 91   Temp 98.3 F (36.8 C) (Axillary)   Resp 16   Ht 5\' 10"  (1.778 m)   Wt 195 lb 8.8 oz (88.7 kg)   SpO2 98%   BMI 28.06 kg/m   VENTILATOR SETTINGS: FiO2 (%):  [28 %] 28 %  INTAKE / OUTPUT: I/O last 3 completed shifts: In: 21 [P.O.:420] Out: 1700 [Urine:1700]  PHYSICAL EXAMINATION:  General:  Ill appearing male in NAD HEENT: MM pink/moist, #4 cuffless trach midline c/d/i  PSY: calm/appropriate  Neuro: Awake, alert, follows commands on R, speech clear with PMV, R craniectomy site soft, no spontaneous movement noted on L CV: s1s2 rrr, no m/r/g PULM: even/non-labored, lungs bilaterally clear  TF:TDDU, non-tender, bsx4 active  Extremities: warm/dry, no edema  Skin: no rashes or lesions  BMET  Recent Labs Lab 05/07/17 0017 05/11/17 0258  NA 135 136  K 4.5 4.9  CL 102 101  CO2 24 26  BUN 26* 28*  CREATININE 0.80 0.72  GLUCOSE 97 105*    Electrolytes  Recent Labs Lab 05/07/17 0017 05/11/17 0258  CALCIUM 10.4* 10.5*    CBC  Recent Labs Lab 05/07/17 0017 05/10/17 0307 05/11/17 0258  WBC 13.1* 11.2* 13.0*  HGB 11.1* 10.8* 11.2*  HCT 37.5* 36.4* 38.2*  PLT 192 208 203    Coag's No results for input(s): APTT, INR in the last 168 hours.  Sepsis Markers No results for input(s): LATICACIDVEN, PROCALCITON, O2SATVEN in the last 168 hours.  ABG No results for input(s): PHART, PCO2ART, PO2ART  in the last 168 hours.  Liver Enzymes No results for input(s): AST, ALT, ALKPHOS, BILITOT, ALBUMIN in the last 168 hours.  Cardiac Enzymes No results for input(s): TROPONINI, PROBNP in the last 168 hours.  Glucose  Recent Labs Lab 05/09/17 2040 05/10/17 0609 05/10/17 1117 05/10/17 1724 05/10/17 2136 05/11/17 0612  GLUCAP 139* 147* 133* 114* 99 121*    Imaging No results found.  STUDIES:  LP 8/24 >> glucose 205, RBC 16, protein 62, WBC 5 CT head 8/24 >> Large Rt MCA EEG 8/27 >> bilateral cerebral dysfunction, non-specific in etiology Echo 8/27 >> EF 65 to 70% CT abd/pelvis 8/30 >> third spacing, 3.5 cm low attenuation Lt hepatic lobe CT Head 9/3>> Severe right hemisphere cytotoxic edema with stable extension of edematous brain through the right craniectomy defect. Stable petechial hemorrhage without malignant hemorrhagicTransformation. Stable intracranial mass effect with leftward midline shift of 7 mm. Small left PCA territory infarct. CXR 9/23 > LLL atx and small effusion Barrium swallow 9/21 > severe oral and mod pharyngeal dysphagia; mod to severe aspiration risk SLP 10/4 >> tolerating PMV, pocketing food on left  CULTURES: CSF 8/24>> negative BCX 8/24>> negative UCX 8/24>> negative Sputum 8/24>> S. Agalactiae Sputum 9/1>> Consistent with normal resp. Flora UC 9/25 >> klebsiella (S-rocephin, imipenem), Ecoli (S- rocephin, imipenem)  ANTIBIOTICS: vanco 8/24 >> 8/26 Zosyn 8/24 >>  8/26, 9/1 >>off  Cleocin 9/18>>> Ampicillin 8/27 > 8/30  SIGNIFICANT EVENTS: 8/23 Admit [facility] 8/23 8/24 Transfer to [facility] after Rt MCA CVA, start 3% NS 8/25 Decompressive craniectomy 8/27 Transfuse 1 unit PRBC 8/28 Transfuse 1 unit PRBC, A fib with RVR 8/30 Transfuse 1 unit PRBC 8/31 Off pressors, reintubated x 2, transfuse PRBC 9/5 Trach 9/6 PEG 9/14 Trach exchanged from 8 cuffed to 6 cuffless 9/22 Trach exchanged to 4 cuffless 10/01 Tolerating PMV, ATC 28% 10/04 ATC 28%, minimal  secretions, tolerating PMV  LINES/TUBES: ETT 8/23 >>9/5 Trach [name]) 9/5 >> Lt IJ CVL 8/23 >>removed PEG 9/6 >>  ASSESSMENT / PLAN:  Acute on chronic hypoxemic respiratory failure s/p trach  Small pleural effusion - improved Plan: ATC as tolerated, 28% Wean O2 for sats > 90%, if makes it to room air would recommend humidity via ATC only PMV as tolerated  SLP efforts appreciated  Await SNF placement  Do not feel he is ready for decannulation with aspiration risks, requires suctioning.   Trach care per protocol  Dysphagia - barrium swallow 9/21 with severe oral and mod pharyngeal dysphagia; mod to severe aspiration risk Plan: Diet per SLP     PCCM will follow for trach needs.  Will see once weekly unless new needs arise.  Stable with #4 Cuffless / ATC.   Noe Gens, NP-C [facility] Pgr: [phone] or if no answer [phone] 05/11/2017, 10:06 AM

## 2017-05-12 LAB — CBC
HCT: 38.9 % — ABNORMAL LOW (ref 39.0–52.0)
Hemoglobin: 12.3 g/dL — ABNORMAL LOW (ref 13.0–17.0)
MCH: 28.9 pg (ref 26.0–34.0)
MCHC: 31.6 g/dL (ref 30.0–36.0)
MCV: 91.5 fL (ref 78.0–100.0)
PLATELETS: 204 10*3/uL (ref 150–400)
RBC: 4.25 MIL/uL (ref 4.22–5.81)
RDW: 16.7 % — AB (ref 11.5–15.5)
WBC: 13.8 10*3/uL — ABNORMAL HIGH (ref 4.0–10.5)

## 2017-05-12 LAB — GLUCOSE, CAPILLARY
GLUCOSE-CAPILLARY: 111 mg/dL — AB (ref 65–99)
Glucose-Capillary: 148 mg/dL — ABNORMAL HIGH (ref 65–99)
Glucose-Capillary: 116 mg/dL — ABNORMAL HIGH (ref 65–99)
Glucose-Capillary: 181 mg/dL — ABNORMAL HIGH (ref 65–99)

## 2017-05-12 NOTE — Progress Notes (Signed)
Physical Therapy Treatment Patient Details Name: Joe Wilkinson MRN: [id number] DOB: 01-03-1961 Today's Date: 05/12/2017    History of Present Illness 56 yo male brought to [facility] with altered mental status from DKA and sepsis.  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  Required Rt decompressive craniectomy and remained on vent post op.ETT 8/23-9/5, s/p tracheostomy 9/5    PT Comments    Pt refusing to participate in any mobility this session but allowed therapist to perform therapeutic exercises with pt (see below). PT will continue to follow pt acutely for mobility progression. Pt would continue to benefit from skilled physical therapy services at this time while admitted and after d/c to address the below listed limitations in order to improve overall safety and independence with functional mobility.    Follow Up Recommendations  SNF;Supervision/Assistance - 24 hour     Equipment Recommendations  None recommended by PT    Recommendations for Other Services       Precautions / Restrictions Precautions Precautions: Fall;Other (comment) Precaution Comments: craniectomy, no bone flap right side; trach in place; peg tube; L side neglect Restrictions Weight Bearing Restrictions: No    Mobility  Bed Mobility                  Transfers                    Ambulation/Gait                 Stairs            Wheelchair Mobility    Modified Rankin (Stroke Patients Only)       Balance                                            Cognition Arousal/Alertness: Awake/alert Behavior During Therapy: Flat affect Overall Cognitive Status: Impaired/Different from baseline Area of Impairment: Problem solving;Awareness;Safety/judgement;Following commands;Attention;Memory                   Current Attention Level: Sustained Memory: Decreased recall of precautions;Decreased short-term memory Following Commands: Follows one step  commands with increased time;Follows one step commands inconsistently;Follows multi-step commands inconsistently;Follows multi-step commands with increased time Safety/Judgement: Decreased awareness of safety;Decreased awareness of deficits Awareness: Intellectual Problem Solving: Slow processing;Decreased initiation;Difficulty sequencing;Requires verbal cues;Requires tactile cues        Exercises Other Exercises Other Exercises: PROM to L LE at all joints, in all planes of motion Other Exercises: ankle DF stretch with knee extended and flexed, 3 bouts x60 seconds Other Exercises: PROM of L LE in PNF diagonals Other Exercises: PROM L UE: finger extension, elbow flexion/extension, shoulder flexion    General Comments        Pertinent Vitals/Pain Pain Assessment: Faces Faces Pain Scale: Hurts a little bit Pain Location: R side of head Pain Descriptors / Indicators: Throbbing Pain Intervention(s): Repositioned;Monitored during session    Home Living                      Prior Function            PT Goals (current goals can now be found in the care plan section) Acute Rehab PT Goals PT Goal Formulation: Patient unable to participate in goal setting Time For Goal Achievement: 05/18/17 Potential to Achieve Goals: Fair Progress towards PT goals:  Progressing toward goals    Frequency    Min 3X/week      PT Plan Current plan remains appropriate    Co-evaluation              AM-PAC PT "6 Clicks" Daily Activity  Outcome Measure  Difficulty turning over in bed (including adjusting bedclothes, sheets and blankets)?: Unable Difficulty moving from lying on back to sitting on the side of the bed? : Unable Difficulty sitting down on and standing up from a chair with arms (e.g., wheelchair, bedside commode, etc,.)?: Unable Help needed moving to and from a bed to chair (including a wheelchair)?: Total Help needed walking in hospital room?: Total Help needed  climbing 3-5 steps with a railing? : Total 6 Click Score: 6    End of Session Equipment Utilized During Treatment: Oxygen Activity Tolerance: Patient tolerated treatment well;Other (comment) (pt refusing to perform any bed mobility or sitting EOB) Patient left: in bed;with call bell/phone within reach;with bed alarm set Nurse Communication: Mobility status;Need for lift equipment PT Visit Diagnosis: Other symptoms and signs involving the nervous system (R29.898);Hemiplegia and hemiparesis Hemiplegia - Right/Left: Left Hemiplegia - dominant/non-dominant: Non-dominant Hemiplegia - caused by: Nontraumatic intracerebral hemorrhage     Time: 1453-1510 PT Time Calculation (min) (ACUTE ONLY): 17 min  Charges:  $Therapeutic Exercise: 8-22 mins                    G Codes:       [place], [place], [place] 05/12/2017, 4:57 PM

## 2017-05-12 NOTE — Progress Notes (Signed)
CSW continuing to follow for discharge needs. Patient remains DTP; no bed at this time. Per chart review, patient is unable to be decannulated at this time.   CSW will continue to follow to place when bed available.  Laveda Abbe, [place] Clinical Social Worker [phone]

## 2017-05-12 NOTE — Progress Notes (Signed)
PROGRESS NOTE    Joe Wilkinson  TFT:[id number] DOB: [birth year] DOA: 03/31/2017 PCP: Patient, No Pcp Per   No chief complaint on file.   Brief Narrative:  HPI on 04/01/2017 by Dr, Hanley Hays (PCCM) [phone] M found unresponsive at bustop admitted to [facility] in DKA, concern for sepsis w/o source, later developed hemiparesis and found to have acute Right MCA CVA.  Now transferred to [facility] ICU for NeuroSx care, seen by NSX and Neuro.  Tentative plan for crani in AM. He is intubated and sedated and unable to provide any HPI, ROS, PMH, PSH, FH, home meds, allergies.  All Hx form medical record.  Interim history  He was transferred to the ICU and underwent decompressive right craniectomy 8/25. Hypertonic saline protocol was completed. He required transfusion support and was treated for AFib with RVR, continued on the ventilator, eventually receiving trach 9/5 and PEG 9/6, on ATC 9/7. Patient does not have health insurance and because of his craniectomy without the ability to fit a helmet, most skilled nursing facilities areunwilling to accept him.  Currently he is getting meds thru PEG and continuous TF. Heparin was converted to prophylaxis Lovenox. He was transferred from PCCm service to [facility] service on 9/25. Currently awaiting placement to snf.  Assessment & Plan   Acute respiratory failure with hypoxia and hypercarbia  -Continue pulmonary hygiene  -s/p trach -Seen by PCCM, did not feel patient was ready for decannulation given his aspiration risks and suctioning requirements -Continue ATC as tolerated  Septic shock secondary to S. 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 Leonie Man, recommended anticoagulation (FOBT negative), started on Eliquis  -LDL 38, hemoglobin A1C 14.3 -Repeat CT had showed further swelling in the region of the complete right MCA territory infarction with more extensive bulging of the brain through the craniectomy defect. No midline shift however because of decompressive craniotomy. At superior margin, there could be some extension of the swollen brain through a dural defect. At the superior margin, small amount of petechial bleeding within the region of the infarction. -PT/OT rec SNF  Atrial fibrillation with RVR, new onset -Currently in sinus rhythm -CHADSVASC 4 -Patient was not placed on anticoagulation as he was found to be anemic and required 4 units PRBC transfusion this admission -Continue Eliquis and metoprolol  Diabetes mellitus, type II -Hemoglobin A1c 14.3 -Continue Levemir, insulin sliding scale CBG monitoring  Anemia blood loss -Unclear source -Abdominal x-ray does not show bleeding, FOBT negative -Hemoglobin currently 12.3, appears to be stable of the past several weeks -Continue to monitor CBC  Escherichia coli, Klebsiella UTI -Continue Keflex - will complete a 10 day course  Nutrition/dysphagia -Speech therapy recommended dysphagia 1 diet, patient currently on tube feeds -Patient did have some abdominal distention on 05/06/2017, abdominal film does not show ileus or obstruction -Continue to monitor  Peri-anal cellulitis with small abscess -Gen. surgery consulted and appreciated, determined neurosurgical drainage necessary -Patient was on clindamycin through 04/29/2017  DVT Prophylaxis  Eliquis   Code Status: Full  Family Communication: None at bedside  Disposition Plan: Admitted, pending SNF    Consultants  PCCM Neurology Neurosurgery General surgery   Procedures  -LP 8/24 >> glucose 205, RBC 16, protein 62, WBC 5 -CT head 8/24 >> Large Rt MCA -8/25 decompressive craniectomy -EEG 8/27 >> bilateral cerebral dysfunction, non-specific in etiology -Echo 8/27 >> EF 65 to 70% -CT abd/pelvis 8/30 >> third spacing, 3.5 cm low attenuation Lt hepatic lobe -CT Head 9/3>> Severe right hemisphere cytotoxic edema with stable extension of edematous brain through the right craniectomy defect. Stable petechial hemorrhage without malignant hemorrhagicTransformation. Stable intracranial mass effect with leftward midline shift of 7 mm. Small left PCA territory infarct. -9/5 Trach placement.  -9/6 PEG tube placement.  -CXR 9/23 > LLL atx and small effusion -Barrium swallow 9/21 > severe oral and mod pharyngeal dysphagia; mod to severe aspiration risk -CT head on 10/1 shows Further swelling in the region of the complete right MCA territory infarction with more extensive bulging of the brain through the craniectomy defect. No midline shift.   Antibiotics   Anti-infectives    Start     Dose/Rate Route Frequency Ordered Stop   05/05/17 2200  cephALEXin (KEFLEX) capsule 500 mg     500 mg Oral Every 12 hours 05/05/17 1640     04/26/17 0600  vancomycin (VANCOCIN) IVPB 750 mg/150 ml premix  Status:  Discontinued     750 mg 150 mL/hr over 60 Minutes Intravenous Every 12 hours 04/25/17 1729 04/25/17 1730   04/25/17 1830  clindamycin (CLEOCIN) capsule 300 mg     300 mg Oral Every 6 hours 04/25/17 1730 04/29/17 1717   04/25/17 1800  vancomycin (VANCOCIN) IVPB 750 mg/150 ml premix  Status:  Discontinued     750 mg 150 mL/hr over 60 Minutes Intravenous Every 12 hours 04/25/17 1728 04/25/17 1729   04/23/17 0945  piperacillin-tazobactam (ZOSYN) IVPB 3.375 g  Status:  Discontinued     3.375 g 12.5 mL/hr over 240 Minutes Intravenous Every 8 hours 04/23/17 0930 04/25/17 1730   04/23/17 0430  vancomycin  (VANCOCIN) IVPB 1000 mg/200 mL premix  Status:  Discontinued     1,000 mg 200 mL/hr over 60 Minutes Intravenous Every 12 hours 04/22/17 1655 04/25/17 1728   04/22/17 1630  vancomycin (VANCOCIN) IVPB 1000 mg/200 mL premix     1,000 mg 200 mL/hr over 60 Minutes Intravenous  Once 04/22/17 1627 04/22/17 1826   04/08/17 0200  piperacillin-tazobactam (ZOSYN) IVPB 3.375 g  Status:  Discontinued     3.375 g 12.5 mL/hr over 240 Minutes Intravenous Every 8 hours 04/08/17 0058 04/15/17 1601   04/08/17 0100  piperacillin-tazobactam (ZOSYN) IVPB 3.375 g  Status:  Discontinued     3.375 g 100 mL/hr over 30 Minutes Intravenous Every 6 hours 04/08/17 0054 04/08/17 0057   04/03/17 1200  ampicillin (OMNIPEN) 2 g in sodium chloride 0.9 % 50 mL IVPB     2 g 150 mL/hr over 20 Minutes Intravenous Every 6 hours 04/03/17 1005 04/06/17 1837   04/01/17 1800  vancomycin (VANCOCIN) IVPB 1000 mg/200 mL premix  Status:  Discontinued     1,000 mg 200 mL/hr over 60 Minutes Intravenous Every 24 hours 04/01/17 0146 04/01/17 0154   04/01/17 1600  ceFAZolin (ANCEF) IVPB 2g/100 mL premix     2 g 200 mL/hr over 30 Minutes Intravenous Every 8 hours 04/01/17 1100 04/02/17 0056   04/01/17 0828  bacitracin 50,000 Units in sodium chloride irrigation 0.9 % 500 mL irrigation  Status:  Discontinued       As needed 04/01/17 0837 04/01/17 0947  04/01/17 0600  piperacillin-tazobactam (ZOSYN) IVPB 3.375 g  Status:  Discontinued     3.375 g 12.5 mL/hr over 240 Minutes Intravenous Every 8 hours 04/01/17 0146 04/01/17 0154   04/01/17 0600  vancomycin (VANCOCIN) IVPB 750 mg/150 ml premix  Status:  Discontinued     750 mg 150 mL/hr over 60 Minutes Intravenous Every 12 hours 04/01/17 0225 04/03/17 0959   04/01/17 0400  piperacillin-tazobactam (ZOSYN) IVPB 3.375 g  Status:  Discontinued     3.375 g 12.5 mL/hr over 240 Minutes Intravenous Every 8 hours 04/01/17 0225 04/03/17 2458      Subjective:   Joe Wilkinson seen and examined  today.  Has headache. Denies chest pain, shortness of breath, abdominal pain, N/V/D/C. Has occasional cough.   Objective:   Vitals:   05/12/17 0426 05/12/17 0933 05/12/17 0952 05/12/17 1212  BP: 120/69  126/80   Pulse: 93 97 (!) 108 98  Resp: 18 16 16 18   Temp: 99 F (37.2 C)  98.6 F (37 C)   TempSrc: Axillary  Oral   SpO2: 95% 97% 95% 96%  Weight:      Height:        Intake/Output Summary (Last 24 hours) at 05/12/17 1308 Last data filed at 05/12/17 1200  Gross per 24 hour  Intake              120 ml  Output              900 ml  Net             -780 ml   Filed Weights   04/25/17 0412 04/26/17 0416 04/26/17 2055  Weight: 88.4 kg (194 lb 14.2 oz) 85.5 kg (188 lb 7.9 oz) 88.7 kg (195 lb 8.8 oz)   Exam  General: Well developed, chronically ill appearing, NAD  HEENT: NCAT, mucous membranes moist.   Neck: Trach  Cardiovascular: S1 S2 auscultated, RRR, no murmurs  Respiratory: Clear to auscultation bilaterally with equal chest rise  Abdomen: Soft, nontender, nondistended, + bowel sounds  Extremities: warm dry without cyanosis clubbing or edema  Neuro: AAOx3, no new deficits  Psych: Appropriate  Data Reviewed: I have personally reviewed following labs and imaging studies  CBC:  Recent Labs Lab 05/07/17 0017 05/10/17 0307 05/11/17 0258 05/12/17 0536  WBC 13.1* 11.2* 13.0* 13.8*  HGB 11.1* 10.8* 11.2* 12.3*  HCT 37.5* 36.4* 38.2* 38.9*  MCV 94.7 93.6 92.9 91.5  PLT 192 208 203 099   Basic Metabolic Panel:  Recent Labs Lab 05/07/17 0017 05/11/17 0258  NA 135 136  K 4.5 4.9  CL 102 101  CO2 24 26  GLUCOSE 97 105*  BUN 26* 28*  CREATININE 0.80 0.72  CALCIUM 10.4* 10.5*   GFR: Estimated Creatinine Clearance: 115.6 mL/min (by C-G formula based on SCr of 0.72 mg/dL). Liver Function Tests: No results for input(s): AST, ALT, ALKPHOS, BILITOT, PROT, ALBUMIN in the last 168 hours. No results for input(s): LIPASE, AMYLASE in the last 168 hours. No  results for input(s): AMMONIA in the last 168 hours. Coagulation Profile: No results for input(s): INR, PROTIME in the last 168 hours. Cardiac Enzymes: No results for input(s): CKTOTAL, CKMB, CKMBINDEX, TROPONINI in the last 168 hours. BNP (last 3 results) No results for input(s): PROBNP in the last 8760 hours. HbA1C: No results for input(s): HGBA1C in the last 72 hours. CBG:  Recent Labs Lab 05/11/17 1123 05/11/17 1647 05/11/17 2122 05/12/17 0606 05/12/17 1209  GLUCAP 162* 133* 128*  148* 111*   Lipid Profile: No results for input(s): CHOL, HDL, LDLCALC, TRIG, CHOLHDL, LDLDIRECT in the last 72 hours. Thyroid Function Tests: No results for input(s): TSH, T4TOTAL, FREET4, T3FREE, THYROIDAB in the last 72 hours. Anemia Panel: No results for input(s): VITAMINB12, FOLATE, FERRITIN, TIBC, IRON, RETICCTPCT in the last 72 hours. Urine analysis:    Component Value Date/Time   COLORURINE YELLOW 05/02/2017 1353   APPEARANCEUR CLOUDY (A) 05/02/2017 1353   LABSPEC 1.017 05/02/2017 1353   PHURINE 7.0 05/02/2017 1353   GLUCOSEU NEGATIVE 05/02/2017 1353   HGBUR NEGATIVE 05/02/2017 1353   BILIRUBINUR NEGATIVE 05/02/2017 1353   KETONESUR NEGATIVE 05/02/2017 1353   PROTEINUR 100 (A) 05/02/2017 1353   NITRITE NEGATIVE 05/02/2017 1353   LEUKOCYTESUR NEGATIVE 05/02/2017 1353   Sepsis Labs: @LABRCNTIP (procalcitonin:4,lacticidven:4)  ) Recent Results (from the past 240 hour(s))  Culture, Urine     Status: Abnormal   Collection Time: 05/02/17  1:53 PM  Result Value Ref Range Status   Specimen Description URINE, RANDOM  Final   Special Requests NONE  Final   Culture (A)  Final    >=100,000 COLONIES/mL KLEBSIELLA PNEUMONIAE >=100,000 COLONIES/mL ESCHERICHIA COLI    Report Status 05/05/2017 FINAL  Final   Organism ID, Bacteria KLEBSIELLA PNEUMONIAE (A)  Final   Organism ID, Bacteria ESCHERICHIA COLI (A)  Final      Susceptibility   Escherichia coli - MIC*    AMPICILLIN >=32 RESISTANT  Resistant     CEFAZOLIN <=4 SENSITIVE Sensitive     CEFTRIAXONE <=1 SENSITIVE Sensitive     CIPROFLOXACIN >=4 RESISTANT Resistant     GENTAMICIN <=1 SENSITIVE Sensitive     IMIPENEM <=0.25 SENSITIVE Sensitive     NITROFURANTOIN <=16 SENSITIVE Sensitive     TRIMETH/SULFA >=320 RESISTANT Resistant     AMPICILLIN/SULBACTAM >=32 RESISTANT Resistant     PIP/TAZO <=4 SENSITIVE Sensitive     Extended ESBL NEGATIVE Sensitive     * >=100,000 COLONIES/mL ESCHERICHIA COLI   Klebsiella pneumoniae - MIC*    AMPICILLIN >=32 RESISTANT Resistant     CEFAZOLIN <=4 SENSITIVE Sensitive     CEFTRIAXONE <=1 SENSITIVE Sensitive     CIPROFLOXACIN <=0.25 SENSITIVE Sensitive     GENTAMICIN <=1 SENSITIVE Sensitive     IMIPENEM <=0.25 SENSITIVE Sensitive     NITROFURANTOIN 64 INTERMEDIATE Intermediate     TRIMETH/SULFA <=20 SENSITIVE Sensitive     AMPICILLIN/SULBACTAM 4 SENSITIVE Sensitive     PIP/TAZO <=4 SENSITIVE Sensitive     Extended ESBL NEGATIVE Sensitive     * >=100,000 COLONIES/mL KLEBSIELLA PNEUMONIAE      Radiology Studies: No results found.   Scheduled Meds: . acetaminophen (TYLENOL) oral liquid 160 mg/5 mL  650 mg Per Tube TID  . apixaban  5 mg Oral BID  . cephALEXin  500 mg Oral Q12H  . chlorhexidine  15 mL Mouth Rinse BID  . feeding supplement (PRO-STAT SUGAR FREE 64)  30 mL Per Tube BID  . free water  250 mL Per Tube Q8H  . gabapentin  300 mg Oral QHS  . glycopyrrolate  1 mg Per Tube BID  . insulin aspart  0-20 Units Subcutaneous TID WC  . insulin aspart  0-5 Units Subcutaneous QHS  . insulin detemir  34 Units Subcutaneous Daily  . metoprolol tartrate  12.5 mg Per Tube BID  . pantoprazole sodium  40 mg Per Tube Daily   Continuous Infusions: . feeding supplement (GLUCERNA 1.2 CAL) 1,000 mL (05/11/17 2237)  LOS: 41 days   Time Spent in minutes   30 minutes  Gabriel Conry D.O. on 05/12/2017 at 1:08 PM  Between 7am to 7pm - Pager - [phone]  After 7pm go to  [email or web address] - password [email or web address]  And look for the night coverage person covering for me after hours  [facility] Office  [phone]

## 2017-05-12 NOTE — Progress Notes (Signed)
Nutrition Follow-up  DOCUMENTATION CODES:   Not applicable  INTERVENTION:  - Continue current TF regimen.  - Will continue to monitor for needs to adjust TF regimen or for need to change to nocturnal TF regimen to support PO intakes.  - Will order for pt to be weighed today as no weight obtained since 9/19.  NUTRITION DIAGNOSIS:   Biting/chewing difficulty related to dysphagia as evidenced by  (Dysphagia 1 diet, need for TF). -updated  GOAL:   Patient will meet greater than or equal to 90% of their needs -met with PO intakes and TF regimen.   MONITOR:   TF tolerance, PO intake, Weight trends, Labs, Skin, I & O's  ASSESSMENT:   Pt admitted to [facility] with AMS from DKA and sepsis, found to have R MCA CVA with 12 mm midline shift and transferred to [facility], required R decompressive craniectomy.   10/5 Pt with PEG and receiving continuous TF: Glucerna 1.2 @ 65 mL/hr with 30 mL Prostat BID, and 250 mL free water TID. This regimen is providing 2072 kcal, 124 grams of protein, and 2006 mL free water. Doc flow sheet indicates that pt consumed 25% of all meals on 10% and 10% of breakfast and 5% of lunch yesterday. Visualized breakfast tray this AM with 100% completion of a cup of yogurt, a bowl of grits, and 75% completion of a cup of OJ.   Pt denies nausea but states he is having slight abdominal pain which began after eating breakfast. He denies TF regimen making him feel full and does not feel that continuous TF is interfering with his desire to eat or with how much he is eating.   Medications reviewed; sliding scale Novolog, 34 units Levemir/day, 40 mg Protonix per PEG/day.  Labs reviewed; CBG: 148 mg/dL this AM, BUN: 28 mg/dL, Ca: 10.5 mg/dL.    10/1 - Pt's diet advanced to Dysphagia 1/thin liquids on 09/28 at 1503.  - SLP continues to follow.  - Meal completion records show pt has consumed  ~18% of recorded meals in addition to current TF regimen. - Pt reports no N/V at this time.  -  No recorded weight reading since 09/19.   Diet Order:  DIET - DYS 1 Room service appropriate? Yes; Fluid consistency: Thin  Skin:  Wound (see comment) (perianal cellulitis, no pressure ulcers)  Last BM:  10/4  Height:   Ht Readings from Last 1 Encounters:  04/26/17 _0  (1.778 m)    Weight:   Wt Readings from Last 1 Encounters:  04/26/17 195 lb 8.8 oz (88.7 kg)    Ideal Body Weight:  75.45 kg  BMI:  Body mass index is 28.06 kg/m.  Estimated Nutritional Needs:   Kcal:  2100-2300  Protein:  115-130 gm  Fluid:  2.1-2.3 L  EDUCATION NEEDS:   No education needs identified at this time    Jarome Matin, MS, RD, LDN, CNSC Inpatient Clinical Dietitian Pager # [phone] After hours/weekend pager # [phone]

## 2017-05-13 ENCOUNTER — Inpatient Hospital Stay (HOSPITAL_COMMUNITY): Payer: Medicaid Other

## 2017-05-13 DIAGNOSIS — T17908S Unspecified foreign body in respiratory tract, part unspecified causing other injury, sequela: Secondary | ICD-10-CM

## 2017-05-13 LAB — CBC
HCT: 40.3 % (ref 39.0–52.0)
Hemoglobin: 12.1 g/dL — ABNORMAL LOW (ref 13.0–17.0)
MCH: 27.8 pg (ref 26.0–34.0)
MCHC: 30 g/dL (ref 30.0–36.0)
MCV: 92.4 fL (ref 78.0–100.0)
PLATELETS: 251 10*3/uL (ref 150–400)
RBC: 4.36 MIL/uL (ref 4.22–5.81)
RDW: 16.2 % — ABNORMAL HIGH (ref 11.5–15.5)
WBC: 14.7 10*3/uL — ABNORMAL HIGH (ref 4.0–10.5)

## 2017-05-13 LAB — GLUCOSE, CAPILLARY
GLUCOSE-CAPILLARY: 135 mg/dL — AB (ref 65–99)
GLUCOSE-CAPILLARY: 145 mg/dL — AB (ref 65–99)
GLUCOSE-CAPILLARY: 145 mg/dL — AB (ref 65–99)
Glucose-Capillary: 131 mg/dL — ABNORMAL HIGH (ref 65–99)

## 2017-05-13 NOTE — Progress Notes (Signed)
Trach care done per RRT. Site cleansed with sterile water, dried, and clean dressing applied. IC was cleansed with NS and sterile water, dried, reinserted into trach flanged and locked back into place w/o complications. Pt SATs are stable on continuous pulse ox monitoring. Pt has a strong non-productive cough.

## 2017-05-13 NOTE — Progress Notes (Signed)
PROGRESS NOTE    Joe Wilkinson  ZOX:[id number] DOB: 11-05-1960 DOA: 03/31/2017 PCP: Patient, No Pcp Per   No chief complaint on file.   Brief Narrative:  HPI on 04/01/2017 by Dr, Hanley Hays (PCCM) [phone] M found unresponsive at bustop admitted to [facility] in DKA, concern for sepsis w/o source, later developed hemiparesis and found to have acute Right MCA CVA.  Now transferred to [facility] ICU for NeuroSx care, seen by NSX and Neuro.  Tentative plan for crani in AM. He is intubated and sedated and unable to provide any HPI, ROS, PMH, PSH, FH, home meds, allergies.  All Hx form medical record.  Interim history  He was transferred to the ICU and underwent decompressive right craniectomy 8/25. Hypertonic saline protocol was completed. He required transfusion support and was treated for AFib with RVR, continued on the ventilator, eventually receiving trach 9/5 and PEG 9/6, on ATC 9/7. Patient does not have health insurance and because of his craniectomy without the ability to fit a helmet, most skilled nursing facilities areunwilling to accept him.  Currently he is getting meds thru PEG and continuous TF. Heparin was converted to prophylaxis Lovenox. He was transferred from PCCm service to [facility] service on 9/25. Currently awaiting placement to snf.  Assessment & Plan   Acute respiratory failure with hypoxia and hypercarbia  -Continue pulmonary hygiene  -s/p trach -Seen by PCCM, did not feel patient was ready for decannulation given his aspiration risks and suctioning requirements -Continue ATC as tolerated  Septic shock secondary to S. 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 Leonie Man, recommended anticoagulation (FOBT negative), started on Eliquis  -LDL 38, hemoglobin A1C 14.3 -Repeat CT had showed further swelling in the region of the complete right MCA territory infarction with more extensive bulging of the brain through the craniectomy defect. No midline shift however because of decompressive craniotomy. At superior margin, there could be some extension of the swollen brain through a dural defect. At the superior margin, small amount of petechial bleeding within the region of the infarction. -PT/OT rec SNF  Atrial fibrillation with RVR, new onset -Currently in sinus rhythm -CHADSVASC 4 -Patient was not placed on anticoagulation as he was found to be anemic and required 4 units PRBC transfusion this admission -Continue Eliquis and metoprolol  Diabetes mellitus, type II -Hemoglobin A1c 14.3 -Continue Levemir, insulin sliding scale CBG monitoring  Anemia blood loss -Unclear source -Abdominal x-ray does not show bleeding, FOBT negative -Hemoglobin currently 12.1, appears to be stable of the past several weeks -Continue to monitor CBC  Escherichia coli, Klebsiella UTI -Continue Keflex - will complete a 10 day course  Nutrition/dysphagia -Speech therapy recommended dysphagia 1 diet, patient currently on tube feeds -Patient did have some abdominal distention on 05/06/2017, abdominal film does not show ileus or obstruction -Continue to monitor  Peri-anal cellulitis with small abscess -Gen. surgery consulted and appreciated, determined neurosurgical drainage necessary -Patient was on clindamycin through 04/29/2017  DVT Prophylaxis  Eliquis   Code Status: Full  Family Communication: None at bedside  Disposition Plan: Admitted, pending SNF    Consultants PCCM  Neurology Neurosurgery General surgery   Procedures  -LP 8/24 >> glucose 205, RBC 16, protein 62, WBC 5 -CT head 8/24 >> Large Rt MCA -8/25 decompressive craniectomy -EEG 8/27 >> bilateral cerebral dysfunction, non-specific in etiology -Echo 8/27 >> EF 65 to 70% -CT abd/pelvis 8/30 >> third spacing, 3.5 cm low attenuation Lt hepatic lobe -CT Head 9/3>> Severe right hemisphere cytotoxic edema with stable extension of edematous brain through the right craniectomy defect. Stable petechial hemorrhage without malignant hemorrhagicTransformation. Stable intracranial mass effect with leftward midline shift of 7 mm. Small left PCA territory infarct. -9/5 Trach placement.  -9/6 PEG tube placement.  -CXR 9/23 > LLL atx and small effusion -Barrium swallow 9/21 > severe oral and mod pharyngeal dysphagia; mod to severe aspiration risk -CT head on 10/1 shows Further swelling in the region of the complete right MCA territory infarction with more extensive bulging of the brain through the craniectomy defect. No midline shift.   Antibiotics   Anti-infectives    Start     Dose/Rate Route Frequency Ordered Stop   05/05/17 2200  cephALEXin (KEFLEX) capsule 500 mg     500 mg Oral Every 12 hours 05/05/17 1640 05/15/17 2159   04/26/17 0600  vancomycin (VANCOCIN) IVPB 750 mg/150 ml premix  Status:  Discontinued     750 mg 150 mL/hr over 60 Minutes Intravenous Every 12 hours 04/25/17 1729 04/25/17 1730   04/25/17 1830  clindamycin (CLEOCIN) capsule 300 mg     300 mg Oral Every 6 hours 04/25/17 1730 04/29/17 1717   04/25/17 1800  vancomycin (VANCOCIN) IVPB 750 mg/150 ml premix  Status:  Discontinued     750 mg 150 mL/hr over 60 Minutes Intravenous Every 12 hours 04/25/17 1728 04/25/17 1729   04/23/17 0945  piperacillin-tazobactam (ZOSYN) IVPB 3.375 g  Status:  Discontinued     3.375 g 12.5 mL/hr over 240 Minutes Intravenous Every 8 hours 04/23/17 0930 04/25/17 1730   04/23/17 0430   vancomycin (VANCOCIN) IVPB 1000 mg/200 mL premix  Status:  Discontinued     1,000 mg 200 mL/hr over 60 Minutes Intravenous Every 12 hours 04/22/17 1655 04/25/17 1728   04/22/17 1630  vancomycin (VANCOCIN) IVPB 1000 mg/200 mL premix     1,000 mg 200 mL/hr over 60 Minutes Intravenous  Once 04/22/17 1627 04/22/17 1826   04/08/17 0200  piperacillin-tazobactam (ZOSYN) IVPB 3.375 g  Status:  Discontinued     3.375 g 12.5 mL/hr over 240 Minutes Intravenous Every 8 hours 04/08/17 0058 04/15/17 1601   04/08/17 0100  piperacillin-tazobactam (ZOSYN) IVPB 3.375 g  Status:  Discontinued     3.375 g 100 mL/hr over 30 Minutes Intravenous Every 6 hours 04/08/17 0054 04/08/17 0057   04/03/17 1200  ampicillin (OMNIPEN) 2 g in sodium chloride 0.9 % 50 mL IVPB     2 g 150 mL/hr over 20 Minutes Intravenous Every 6 hours 04/03/17 1005 04/06/17 1837   04/01/17 1800  vancomycin (VANCOCIN) IVPB 1000 mg/200 mL premix  Status:  Discontinued     1,000 mg 200 mL/hr over 60 Minutes Intravenous Every 24 hours 04/01/17 0146 04/01/17 0154   04/01/17 1600  ceFAZolin (ANCEF) IVPB 2g/100 mL premix     2 g 200 mL/hr over 30 Minutes Intravenous Every 8 hours 04/01/17 1100 04/02/17 0056   04/01/17 0828  bacitracin 50,000 Units in sodium chloride irrigation 0.9 % 500 mL irrigation  Status:  Discontinued       As needed 04/01/17 0837 04/01/17 0947  04/01/17 0600  piperacillin-tazobactam (ZOSYN) IVPB 3.375 g  Status:  Discontinued     3.375 g 12.5 mL/hr over 240 Minutes Intravenous Every 8 hours 04/01/17 0146 04/01/17 0154   04/01/17 0600  vancomycin (VANCOCIN) IVPB 750 mg/150 ml premix  Status:  Discontinued     750 mg 150 mL/hr over 60 Minutes Intravenous Every 12 hours 04/01/17 0225 04/03/17 0959   04/01/17 0400  piperacillin-tazobactam (ZOSYN) IVPB 3.375 g  Status:  Discontinued     3.375 g 12.5 mL/hr over 240 Minutes Intravenous Every 8 hours 04/01/17 0225 04/03/17 3500      Subjective:   Kolson Chovanec seen and  examined today.  Has no complaints today, states "I feel good." Denies chest pain, shortness of breath, abdominal pain, N/V/D/C.   Objective:   Vitals:   05/13/17 0700 05/13/17 0925 05/13/17 1204 05/13/17 1244  BP:  131/79  120/71  Pulse: 87 97 90 91  Resp: 16 16 16 16   Temp:  98 F (36.7 C)  97.6 F (36.4 C)  TempSrc:  Oral  Oral  SpO2: 96% 97% 97% 97%  Weight:      Height:        Intake/Output Summary (Last 24 hours) at 05/13/17 1302 Last data filed at 05/13/17 1245  Gross per 24 hour  Intake              935 ml  Output             3100 ml  Net            -2165 ml   Filed Weights   04/26/17 0416 04/26/17 2055 05/12/17 1112  Weight: 85.5 kg (188 lb 7.9 oz) 88.7 kg (195 lb 8.8 oz) 83.7 kg (184 lb 9.6 oz)   Exam  General: Well developed, chronically ill-appearing, NAD  HEENT: NCAT, mucous membranes moist. Right craniectomy  Neck: Trach  Cardiovascular: S1 S2 auscultated, RRR, no murmurs  Respiratory: Clear to auscultation bilaterally with equal chest rise  Abdomen: Soft, nontender, nondistended, + bowel sounds  Extremities: warm dry without cyanosis clubbing or edema  Neuro: AAOx3, no new deficits   Psych: Appropriate and pleasant   Data Reviewed: I have personally reviewed following labs and imaging studies  CBC:  Recent Labs Lab 05/07/17 0017 05/10/17 0307 05/11/17 0258 05/12/17 0536 05/13/17 0524  WBC 13.1* 11.2* 13.0* 13.8* 14.7*  HGB 11.1* 10.8* 11.2* 12.3* 12.1*  HCT 37.5* 36.4* 38.2* 38.9* 40.3  MCV 94.7 93.6 92.9 91.5 92.4  PLT 192 208 203 204 938   Basic Metabolic Panel:  Recent Labs Lab 05/07/17 0017 05/11/17 0258  NA 135 136  K 4.5 4.9  CL 102 101  CO2 24 26  GLUCOSE 97 105*  BUN 26* 28*  CREATININE 0.80 0.72  CALCIUM 10.4* 10.5*   GFR: Estimated Creatinine Clearance: 106.5 mL/min (by C-G formula based on SCr of 0.72 mg/dL). Liver Function Tests: No results for input(s): AST, ALT, ALKPHOS, BILITOT, PROT, ALBUMIN in the last  168 hours. No results for input(s): LIPASE, AMYLASE in the last 168 hours. No results for input(s): AMMONIA in the last 168 hours. Coagulation Profile: No results for input(s): INR, PROTIME in the last 168 hours. Cardiac Enzymes: No results for input(s): CKTOTAL, CKMB, CKMBINDEX, TROPONINI in the last 168 hours. BNP (last 3 results) No results for input(s): PROBNP in the last 8760 hours. HbA1C: No results for input(s): HGBA1C in the last 72 hours. CBG:  Recent Labs Lab 05/12/17 1209 05/12/17 1612  05/12/17 2103 05/13/17 0613 05/13/17 1141  GLUCAP 111* 116* 181* 131* 135*   Lipid Profile: No results for input(s): CHOL, HDL, LDLCALC, TRIG, CHOLHDL, LDLDIRECT in the last 72 hours. Thyroid Function Tests: No results for input(s): TSH, T4TOTAL, FREET4, T3FREE, THYROIDAB in the last 72 hours. Anemia Panel: No results for input(s): VITAMINB12, FOLATE, FERRITIN, TIBC, IRON, RETICCTPCT in the last 72 hours. Urine analysis:    Component Value Date/Time   COLORURINE YELLOW 05/02/2017 1353   APPEARANCEUR CLOUDY (A) 05/02/2017 1353   LABSPEC 1.017 05/02/2017 1353   PHURINE 7.0 05/02/2017 1353   GLUCOSEU NEGATIVE 05/02/2017 1353   HGBUR NEGATIVE 05/02/2017 1353   BILIRUBINUR NEGATIVE 05/02/2017 1353   KETONESUR NEGATIVE 05/02/2017 1353   PROTEINUR 100 (A) 05/02/2017 1353   NITRITE NEGATIVE 05/02/2017 1353   LEUKOCYTESUR NEGATIVE 05/02/2017 1353   Sepsis Labs: @LABRCNTIP (procalcitonin:4,lacticidven:4)  ) No results found for this or any previous visit (from the past 240 hour(s)).    Radiology Studies: Dg Chest Port 1 View  Result Date: 05/13/2017 CLINICAL DATA:  Leukocytosis. EXAM: PORTABLE CHEST 1 VIEW COMPARISON:  05/05/2017 FINDINGS: Tracheostomy remains in place. There is mild atelectasis at both lung bases. No consolidation or lobar collapse. Upper lungs are clear. No heart failure or effusion. IMPRESSION: Mild bibasilar atelectasis. Electronically Signed   By: Nelson Chimes  M.D.   On: 05/13/2017 08:55     Scheduled Meds: . acetaminophen (TYLENOL) oral liquid 160 mg/5 mL  650 mg Per Tube TID  . apixaban  5 mg Oral BID  . cephALEXin  500 mg Oral Q12H  . chlorhexidine  15 mL Mouth Rinse BID  . feeding supplement (PRO-STAT SUGAR FREE 64)  30 mL Per Tube BID  . free water  250 mL Per Tube Q8H  . gabapentin  300 mg Oral QHS  . glycopyrrolate  1 mg Per Tube BID  . insulin aspart  0-20 Units Subcutaneous TID WC  . insulin aspart  0-5 Units Subcutaneous QHS  . insulin detemir  34 Units Subcutaneous Daily  . metoprolol tartrate  12.5 mg Per Tube BID  . pantoprazole sodium  40 mg Per Tube Daily   Continuous Infusions: . feeding supplement (GLUCERNA 1.2 CAL) 1,000 mL (05/13/17 1151)     LOS: 42 days   Time Spent in minutes   30 minutes  Josia Cueva D.O. on 05/13/2017 at 1:02 PM  Between 7am to 7pm - Pager - [phone]  After 7pm go to [email or web address] - password [email or web address]  And look for the night coverage person covering for me after hours  [facility] Office  [phone]

## 2017-05-14 DIAGNOSIS — Z452 Encounter for adjustment and management of vascular access device: Secondary | ICD-10-CM

## 2017-05-14 DIAGNOSIS — Z789 Other specified health status: Secondary | ICD-10-CM

## 2017-05-14 LAB — GLUCOSE, CAPILLARY
GLUCOSE-CAPILLARY: 109 mg/dL — AB (ref 65–99)
GLUCOSE-CAPILLARY: 112 mg/dL — AB (ref 65–99)
Glucose-Capillary: 141 mg/dL — ABNORMAL HIGH (ref 65–99)
Glucose-Capillary: 134 mg/dL — ABNORMAL HIGH (ref 65–99)

## 2017-05-14 NOTE — Progress Notes (Signed)
PROGRESS NOTE    Joe Wilkinson  QIH:[id number] DOB: 1960-08-28 DOA: 03/31/2017 PCP: Patient, No Pcp Per   No chief complaint on file.   Brief Narrative:  HPI on 04/01/2017 by Dr. Hanley Hays (PCCM) [phone] M found unresponsive at bustop admitted to [facility] in [place], concern for sepsis w/o source, later developed hemiparesis and found to have acute Right MCA CVA.  Now transferred to [address] [facility] ICU for NeuroSx care, seen by NSX and Neuro.  Tentative plan for crani in AM. He is intubated and sedated and unable to provide any HPI, ROS, PMH, PSH, FH, home meds, allergies.  All Hx form medical record.  Interim history  He was transferred to the ICU and underwent decompressive right craniectomy 8/25. Hypertonic saline protocol was completed. He required transfusion support and was treated for AFib with RVR, continued on the ventilator, eventually receiving trach 9/5 and PEG 9/6, on ATC 9/7. Patient does not have health insurance and because of his craniectomy without the ability to fit a helmet, most skilled nursing facilities areunwilling to accept him.  Currently he is getting meds thru PEG and continuous TF. Heparin was converted to prophylaxis Lovenox. He was transferred from PCCm service to [facility] service on 9/25. Currently awaiting placement to snf.  Assessment & Plan   Acute respiratory failure with hypoxia and hypercarbia  -Continue pulmonary hygiene  -s/p trach -Seen by PCCM, did not feel patient was ready for decannulation given his aspiration risks and suctioning requirements -Continue ATC as tolerated  Septic shock secondary to S. 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 Leonie Man, recommended anticoagulation (FOBT negative), started on Eliquis  -LDL 38, hemoglobin A1C 14.3 -Repeat CT had showed further swelling in the region of the complete right MCA territory infarction with more extensive bulging of the brain through the craniectomy defect. No midline shift however because of decompressive craniotomy. At superior margin, there could be some extension of the swollen brain through a dural defect. At the superior margin, small amount of petechial bleeding within the region of the infarction. -PT/OT rec SNF  Atrial fibrillation with RVR, new onset -Currently in sinus rhythm -CHADSVASC 4 -Patient was not placed on anticoagulation as he was found to be anemic and required 4 units PRBC transfusion this admission -Continue Eliquis and metoprolol  Diabetes mellitus, type II -Hemoglobin A1c 14.3 -Continue Levemir, insulin sliding scale CBG monitoring  Anemia blood loss -Unclear source -Abdominal x-ray does not show bleeding, FOBT negative -Hemoglobin currently 12.1, appears to be stable of the past several weeks -Continue to monitor CBC  Escherichia coli, Klebsiella UTI -Continue Keflex - will complete a 10 day course  Nutrition/dysphagia -Speech therapy recommended dysphagia 1 diet, patient currently on tube feeds -Patient did have some abdominal distention on 05/06/2017, abdominal film does not show ileus or obstruction -Continue to monitor  Peri-anal cellulitis with small abscess -Gen. surgery consulted and appreciated, determined neurosurgical drainage necessary -Patient was on clindamycin through 04/29/2017  DVT Prophylaxis  Eliquis   Code Status: Full  Family Communication: None at bedside  Disposition Plan: Admitted, pending SNF    Consultants PCCM  Neurology Neurosurgery General surgery   Procedures  -LP 8/24 >> glucose 205, RBC 16, protein 62, WBC 5 -CT head 8/24 >> Large Rt MCA -8/25 decompressive craniectomy -EEG 8/27 >> bilateral cerebral dysfunction, non-specific in etiology -Echo 8/27 >> EF 65 to 70% -CT abd/pelvis 8/30 >> third spacing, 3.5 cm low attenuation Lt hepatic lobe -CT Head 9/3>> Severe right hemisphere cytotoxic edema with stable extension of edematous brain through the right craniectomy defect. Stable petechial hemorrhage without malignant hemorrhagicTransformation. Stable intracranial mass effect with leftward midline shift of 7 mm. Small left PCA territory infarct. -9/5 Trach placement.  -9/6 PEG tube placement.  -CXR 9/23 > LLL atx and small effusion -Barrium swallow 9/21 > severe oral and mod pharyngeal dysphagia; mod to severe aspiration risk -CT head on 10/1 shows Further swelling in the region of the complete right MCA territory infarction with more extensive bulging of the brain through the craniectomy defect. No midline shift.   Antibiotics   Anti-infectives    Start     Dose/Rate Route Frequency Ordered Stop   05/05/17 2200  cephALEXin (KEFLEX) capsule 500 mg     500 mg Oral Every 12 hours 05/05/17 1640 05/15/17 2159   04/26/17 0600  vancomycin (VANCOCIN) IVPB 750 mg/150 ml premix  Status:  Discontinued     750 mg 150 mL/hr over 60 Minutes Intravenous Every 12 hours 04/25/17 1729 04/25/17 1730   04/25/17 1830  clindamycin (CLEOCIN) capsule 300 mg     300 mg Oral Every 6 hours 04/25/17 1730 04/29/17 1717   04/25/17 1800  vancomycin (VANCOCIN) IVPB 750 mg/150 ml premix  Status:  Discontinued     750 mg 150 mL/hr over 60 Minutes Intravenous Every 12 hours 04/25/17 1728 04/25/17 1729   04/23/17 0945  piperacillin-tazobactam (ZOSYN) IVPB 3.375 g  Status:  Discontinued     3.375 g 12.5 mL/hr over 240 Minutes Intravenous Every 8 hours 04/23/17 0930 04/25/17 1730   04/23/17 0430   vancomycin (VANCOCIN) IVPB 1000 mg/200 mL premix  Status:  Discontinued     1,000 mg 200 mL/hr over 60 Minutes Intravenous Every 12 hours 04/22/17 1655 04/25/17 1728   04/22/17 1630  vancomycin (VANCOCIN) IVPB 1000 mg/200 mL premix     1,000 mg 200 mL/hr over 60 Minutes Intravenous  Once 04/22/17 1627 04/22/17 1826   04/08/17 0200  piperacillin-tazobactam (ZOSYN) IVPB 3.375 g  Status:  Discontinued     3.375 g 12.5 mL/hr over 240 Minutes Intravenous Every 8 hours 04/08/17 0058 04/15/17 1601   04/08/17 0100  piperacillin-tazobactam (ZOSYN) IVPB 3.375 g  Status:  Discontinued     3.375 g 100 mL/hr over 30 Minutes Intravenous Every 6 hours 04/08/17 0054 04/08/17 0057   04/03/17 1200  ampicillin (OMNIPEN) 2 g in sodium chloride 0.9 % 50 mL IVPB     2 g 150 mL/hr over 20 Minutes Intravenous Every 6 hours 04/03/17 1005 04/06/17 1837   04/01/17 1800  vancomycin (VANCOCIN) IVPB 1000 mg/200 mL premix  Status:  Discontinued     1,000 mg 200 mL/hr over 60 Minutes Intravenous Every 24 hours 04/01/17 0146 04/01/17 0154   04/01/17 1600  ceFAZolin (ANCEF) IVPB 2g/100 mL premix     2 g 200 mL/hr over 30 Minutes Intravenous Every 8 hours 04/01/17 1100 04/02/17 0056   04/01/17 0828  bacitracin 50,000 Units in sodium chloride irrigation 0.9 % 500 mL irrigation  Status:  Discontinued       As needed 04/01/17 0837 04/01/17 0947  04/01/17 0600  piperacillin-tazobactam (ZOSYN) IVPB 3.375 g  Status:  Discontinued     3.375 g 12.5 mL/hr over 240 Minutes Intravenous Every 8 hours 04/01/17 0146 04/01/17 0154   04/01/17 0600  vancomycin (VANCOCIN) IVPB 750 mg/150 ml premix  Status:  Discontinued     750 mg 150 mL/hr over 60 Minutes Intravenous Every 12 hours 04/01/17 0225 04/03/17 0959   04/01/17 0400  piperacillin-tazobactam (ZOSYN) IVPB 3.375 g  Status:  Discontinued     3.375 g 12.5 mL/hr over 240 Minutes Intravenous Every 8 hours 04/01/17 0225 04/03/17 1696      Subjective:   Joe Wilkinson seen and  examined today.  Patient has no complaints today.   Objective:   Vitals:   05/14/17 0935 05/14/17 1008 05/14/17 1200 05/14/17 1403  BP: 130/75 115/77  118/70  Pulse: (!) 101 (!) 103 99 96  Resp:  18 18 18   Temp: 98.4 F (36.9 C) 97.7 F (36.5 C)  98.1 F (36.7 C)  TempSrc: Oral Axillary  Axillary  SpO2:  94% 98% 98%  Weight:      Height:        Intake/Output Summary (Last 24 hours) at 05/14/17 1412 Last data filed at 05/14/17 1100  Gross per 24 hour  Intake              345 ml  Output             1100 ml  Net             -755 ml   Filed Weights   04/26/17 0416 04/26/17 2055 05/12/17 1112  Weight: 85.5 kg (188 lb 7.9 oz) 88.7 kg (195 lb 8.8 oz) 83.7 kg (184 lb 9.6 oz)   Exam  General: Well developed, well nourished, NAD, appears stated age  86: NCAT,mucous membranes moist. Right craniectomy  Neck: Trach  Cardiovascular: S1 S2 auscultated, RRR, no murmur  Respiratory: Clear to auscultation bilaterally with equal chest rise  Abdomen: Soft, nontender, nondistended, + bowel sounds  Extremities: warm dry without cyanosis clubbing or edema  Neuro: AAOx3, no new deficits  Psych:Appropriate and pleasant  Data Reviewed: I have personally reviewed following labs and imaging studies  CBC:  Recent Labs Lab 05/10/17 0307 05/11/17 0258 05/12/17 0536 05/13/17 0524  WBC 11.2* 13.0* 13.8* 14.7*  HGB 10.8* 11.2* 12.3* 12.1*  HCT 36.4* 38.2* 38.9* 40.3  MCV 93.6 92.9 91.5 92.4  PLT 208 203 204 789   Basic Metabolic Panel:  Recent Labs Lab 05/11/17 0258  NA 136  K 4.9  CL 101  CO2 26  GLUCOSE 105*  BUN 28*  CREATININE 0.72  CALCIUM 10.5*   GFR: Estimated Creatinine Clearance: 106.5 mL/min (by C-G formula based on SCr of 0.72 mg/dL). Liver Function Tests: No results for input(s): AST, ALT, ALKPHOS, BILITOT, PROT, ALBUMIN in the last 168 hours. No results for input(s): LIPASE, AMYLASE in the last 168 hours. No results for input(s): AMMONIA in the last  168 hours. Coagulation Profile: No results for input(s): INR, PROTIME in the last 168 hours. Cardiac Enzymes: No results for input(s): CKTOTAL, CKMB, CKMBINDEX, TROPONINI in the last 168 hours. BNP (last 3 results) No results for input(s): PROBNP in the last 8760 hours. HbA1C: No results for input(s): HGBA1C in the last 72 hours. CBG:  Recent Labs Lab 05/13/17 1141 05/13/17 1612 05/13/17 2107 05/14/17 0617 05/14/17 1146  GLUCAP 135* 145* 145* 141* 134*   Lipid Profile: No results for input(s): CHOL, HDL, LDLCALC,  TRIG, CHOLHDL, LDLDIRECT in the last 72 hours. Thyroid Function Tests: No results for input(s): TSH, T4TOTAL, FREET4, T3FREE, THYROIDAB in the last 72 hours. Anemia Panel: No results for input(s): VITAMINB12, FOLATE, FERRITIN, TIBC, IRON, RETICCTPCT in the last 72 hours. Urine analysis:    Component Value Date/Time   COLORURINE YELLOW 05/02/2017 1353   APPEARANCEUR CLOUDY (A) 05/02/2017 1353   LABSPEC 1.017 05/02/2017 1353   PHURINE 7.0 05/02/2017 1353   GLUCOSEU NEGATIVE 05/02/2017 1353   HGBUR NEGATIVE 05/02/2017 1353   BILIRUBINUR NEGATIVE 05/02/2017 1353   KETONESUR NEGATIVE 05/02/2017 1353   PROTEINUR 100 (A) 05/02/2017 1353   NITRITE NEGATIVE 05/02/2017 1353   LEUKOCYTESUR NEGATIVE 05/02/2017 1353   Sepsis Labs: @LABRCNTIP (procalcitonin:4,lacticidven:4)  ) No results found for this or any previous visit (from the past 240 hour(s)).    Radiology Studies: Dg Chest Port 1 View  Result Date: 05/13/2017 CLINICAL DATA:  Leukocytosis. EXAM: PORTABLE CHEST 1 VIEW COMPARISON:  05/05/2017 FINDINGS: Tracheostomy remains in place. There is mild atelectasis at both lung bases. No consolidation or lobar collapse. Upper lungs are clear. No heart failure or effusion. IMPRESSION: Mild bibasilar atelectasis. Electronically Signed   By: Nelson Chimes M.D.   On: 05/13/2017 08:55     Scheduled Meds: . acetaminophen (TYLENOL) oral liquid 160 mg/5 mL  650 mg Per Tube  TID  . apixaban  5 mg Oral BID  . cephALEXin  500 mg Oral Q12H  . chlorhexidine  15 mL Mouth Rinse BID  . feeding supplement (PRO-STAT SUGAR FREE 64)  30 mL Per Tube BID  . free water  250 mL Per Tube Q8H  . gabapentin  300 mg Oral QHS  . glycopyrrolate  1 mg Per Tube BID  . insulin aspart  0-20 Units Subcutaneous TID WC  . insulin aspart  0-5 Units Subcutaneous QHS  . insulin detemir  34 Units Subcutaneous Daily  . metoprolol tartrate  12.5 mg Per Tube BID  . pantoprazole sodium  40 mg Per Tube Daily   Continuous Infusions: . feeding supplement (GLUCERNA 1.2 CAL) 1,000 mL (05/13/17 1151)     LOS: 43 days   Time Spent in minutes   25 minutes  Joe Wilkinson D.O. on 05/14/2017 at 2:12 PM  Between 7am to 7pm - Pager - [phone]  After 7pm go to [email or web address] - password [email or web address]  And look for the night coverage person covering for me after hours  [facility] Office  [phone]

## 2017-05-15 DIAGNOSIS — J810 Acute pulmonary edema: Secondary | ICD-10-CM

## 2017-05-15 LAB — GLUCOSE, CAPILLARY
GLUCOSE-CAPILLARY: 155 mg/dL — AB (ref 65–99)
GLUCOSE-CAPILLARY: 160 mg/dL — AB (ref 65–99)
Glucose-Capillary: 116 mg/dL — ABNORMAL HIGH (ref 65–99)
Glucose-Capillary: 124 mg/dL — ABNORMAL HIGH (ref 65–99)

## 2017-05-15 LAB — CBC
HCT: 40 % (ref 39.0–52.0)
HEMOGLOBIN: 11.9 g/dL — AB (ref 13.0–17.0)
MCH: 27.8 pg (ref 26.0–34.0)
MCHC: 29.8 g/dL — ABNORMAL LOW (ref 30.0–36.0)
MCV: 93.5 fL (ref 78.0–100.0)
Platelets: 226 10*3/uL (ref 150–400)
RBC: 4.28 MIL/uL (ref 4.22–5.81)
RDW: 16.2 % — ABNORMAL HIGH (ref 11.5–15.5)
WBC: 13 10*3/uL — AB (ref 4.0–10.5)

## 2017-05-15 LAB — BASIC METABOLIC PANEL
Anion gap: 8 (ref 5–15)
BUN: 28 mg/dL — ABNORMAL HIGH (ref 6–20)
CHLORIDE: 102 mmol/L (ref 101–111)
CO2: 27 mmol/L (ref 22–32)
Calcium: 10.6 mg/dL — ABNORMAL HIGH (ref 8.9–10.3)
Creatinine, Ser: 0.75 mg/dL (ref 0.61–1.24)
Glucose, Bld: 142 mg/dL — ABNORMAL HIGH (ref 65–99)
POTASSIUM: 4.5 mmol/L (ref 3.5–5.1)
SODIUM: 137 mmol/L (ref 135–145)

## 2017-05-15 NOTE — Progress Notes (Signed)
PROGRESS NOTE    Joe Wilkinson  GHW:[id number] DOB: [birth year] DOA: 03/31/2017 PCP: Patient, No Pcp Per   No chief complaint on file.   Brief Narrative:  HPI on 04/01/2017 by Dr. Hanley Hays (PCCM) [phone] M found unresponsive at bustop admitted to [facility] in DKA, concern for sepsis w/o source, later developed hemiparesis and found to have acute Right MCA CVA.  Now transferred to [facility] ICU for NeuroSx care, seen by NSX and Neuro.  Tentative plan for crani in AM. He is intubated and sedated and unable to provide any HPI, ROS, PMH, PSH, FH, home meds, allergies.  All Hx form medical record.  Interim history  He was transferred to the ICU and underwent decompressive right craniectomy 8/25. Hypertonic saline protocol was completed. He required transfusion support and was treated for AFib with RVR, continued on the ventilator, eventually receiving trach 9/5 and PEG 9/6, on ATC 9/7. Patient does not have health insurance and because of his craniectomy without the ability to fit a helmet, most skilled nursing facilities areunwilling to accept him.  Currently he is getting meds thru PEG and continuous TF. Heparin was converted to prophylaxis Lovenox. He was transferred from PCCm service to [facility] service on 9/25. Currently awaiting placement to snf.  Assessment & Plan   Acute respiratory failure with hypoxia and hypercarbia  -Continue pulmonary hygiene  -s/p trach -Seen by PCCM, did not feel patient was ready for decannulation given his aspiration risks and suctioning requirements -Continue ATC as tolerated  Septic shock secondary to S. 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 Leonie Man, recommended anticoagulation (FOBT negative), started on Eliquis  -LDL 38, hemoglobin A1C 14.3 -Repeat CT had showed further swelling in the region of the complete right MCA territory infarction with more extensive bulging of the brain through the craniectomy defect. No midline shift however because of decompressive craniotomy. At superior margin, there could be some extension of the swollen brain through a dural defect. At the superior margin, small amount of petechial bleeding within the region of the infarction. -PT/OT rec SNF  Atrial fibrillation with RVR, new onset -Currently in sinus rhythm -CHADSVASC 4 -Patient was not placed on anticoagulation as he was found to be anemic and required 4 units PRBC transfusion this admission -Continue Eliquis and metoprolol  Diabetes mellitus, type II -Hemoglobin A1c 14.3 -Continue Levemir, insulin sliding scale CBG monitoring  Anemia blood loss -Unclear source -Abdominal x-ray does not show bleeding, FOBT negative -Hemoglobin currently 11.9, appears to be stable of the past several weeks -Continue to monitor CBC  Escherichia coli, Klebsiella UTI -Completed 10 days of keflex  Nutrition/dysphagia -Speech therapy recommended dysphagia 1 diet, patient currently on tube feeds -Patient did have some abdominal distention on 05/06/2017, abdominal film does not show ileus or obstruction -Continue to monitor  Peri-anal cellulitis with small abscess -Gen. surgery consulted and appreciated, determined neurosurgical drainage necessary -Patient was on clindamycin through 04/29/2017  DVT Prophylaxis  Eliquis   Code Status: Full  Family Communication: None at bedside  Disposition Plan: Admitted, pending SNF    Consultants [facility] Neurology Neurosurgery  General surgery   Procedures  -LP 8/24 >> glucose 205, RBC 16, protein 62, WBC 5 -CT head 8/24 >> Large Rt MCA -8/25 decompressive craniectomy -EEG 8/27 >> bilateral cerebral dysfunction, non-specific in etiology -Echo 8/27 >> EF 65 to 70% -CT abd/pelvis 8/30 >> third spacing, 3.5 cm low attenuation Lt hepatic lobe -CT Head 9/3>> Severe right hemisphere cytotoxic edema with stable extension of edematous brain through the right craniectomy defect. Stable petechial hemorrhage without malignant hemorrhagicTransformation. Stable intracranial mass effect with leftward midline shift of 7 mm. Small left PCA territory infarct. -9/5 Trach placement.  -9/6 PEG tube placement.  -CXR 9/23 > LLL atx and small effusion -Barrium swallow 9/21 > severe oral and mod pharyngeal dysphagia; mod to severe aspiration risk -CT head on 10/1 shows Further swelling in the region of the complete right MCA territory infarction with more extensive bulging of the brain through the craniectomy defect. No midline shift.   Antibiotics   Anti-infectives    Start     Dose/Rate Route Frequency Ordered Stop   05/05/17 2200  cephALEXin (KEFLEX) capsule 500 mg     500 mg Oral Every 12 hours 05/05/17 1640 05/15/17 1042   04/26/17 0600  vancomycin (VANCOCIN) IVPB 750 mg/150 ml premix  Status:  Discontinued     750 mg 150 mL/hr over 60 Minutes Intravenous Every 12 hours 04/25/17 1729 04/25/17 1730   04/25/17 1830  clindamycin (CLEOCIN) capsule 300 mg     300 mg Oral Every 6 hours 04/25/17 1730 04/29/17 1717   04/25/17 1800  vancomycin (VANCOCIN) IVPB 750 mg/150 ml premix  Status:  Discontinued     750 mg 150 mL/hr over 60 Minutes Intravenous Every 12 hours 04/25/17 1728 04/25/17 1729   04/23/17 0945  piperacillin-tazobactam (ZOSYN) IVPB 3.375 g  Status:  Discontinued     3.375 g 12.5 mL/hr over 240 Minutes Intravenous Every 8 hours 04/23/17 0930 04/25/17 1730   04/23/17 0430   vancomycin (VANCOCIN) IVPB 1000 mg/200 mL premix  Status:  Discontinued     1,000 mg 200 mL/hr over 60 Minutes Intravenous Every 12 hours 04/22/17 1655 04/25/17 1728   04/22/17 1630  vancomycin (VANCOCIN) IVPB 1000 mg/200 mL premix     1,000 mg 200 mL/hr over 60 Minutes Intravenous  Once 04/22/17 1627 04/22/17 1826   04/08/17 0200  piperacillin-tazobactam (ZOSYN) IVPB 3.375 g  Status:  Discontinued     3.375 g 12.5 mL/hr over 240 Minutes Intravenous Every 8 hours 04/08/17 0058 04/15/17 1601   04/08/17 0100  piperacillin-tazobactam (ZOSYN) IVPB 3.375 g  Status:  Discontinued     3.375 g 100 mL/hr over 30 Minutes Intravenous Every 6 hours 04/08/17 0054 04/08/17 0057   04/03/17 1200  ampicillin (OMNIPEN) 2 g in sodium chloride 0.9 % 50 mL IVPB     2 g 150 mL/hr over 20 Minutes Intravenous Every 6 hours 04/03/17 1005 04/06/17 1837   04/01/17 1800  vancomycin (VANCOCIN) IVPB 1000 mg/200 mL premix  Status:  Discontinued     1,000 mg 200 mL/hr over 60 Minutes Intravenous Every 24 hours 04/01/17 0146 04/01/17 0154   04/01/17 1600  ceFAZolin (ANCEF) IVPB 2g/100 mL premix     2 g 200 mL/hr over 30 Minutes Intravenous Every 8 hours 04/01/17 1100 04/02/17 0056   04/01/17 0828  bacitracin 50,000 Units in sodium chloride irrigation 0.9 % 500 mL irrigation  Status:  Discontinued       As needed 04/01/17 0837 04/01/17 0947   04/01/17 0600  piperacillin-tazobactam (ZOSYN) IVPB 3.375 g  Status:  Discontinued     3.375 g 12.5 mL/hr over 240 Minutes Intravenous Every 8 hours 04/01/17 0146 04/01/17 0154   04/01/17 0600  vancomycin (VANCOCIN) IVPB 750 mg/150 ml premix  Status:  Discontinued     750 mg 150 mL/hr over 60 Minutes Intravenous Every 12 hours 04/01/17 0225 04/03/17 0959   04/01/17 0400  piperacillin-tazobactam (ZOSYN) IVPB 3.375 g  Status:  Discontinued     3.375 g 12.5 mL/hr over 240 Minutes Intravenous Every 8 hours 04/01/17 0225 04/03/17 5573      Subjective:   Daniyal Tabor seen and  examined today.  Patient has no complaints today. Denies headache. Denies chest pain, shortness of breath, abdominal pain.   Objective:   Vitals:   05/15/17 0802 05/15/17 0847 05/15/17 1216 05/15/17 1300  BP:  119/68  121/77  Pulse: 90 97 90 93  Resp: 18 20 18 20   Temp:  98.9 F (37.2 C)  97.9 F (36.6 C)  TempSrc:  Tympanic  Oral  SpO2: 95% 99% 98% 99%  Weight:      Height:        Intake/Output Summary (Last 24 hours) at 05/15/17 1342 Last data filed at 05/15/17 1221  Gross per 24 hour  Intake              335 ml  Output             1301 ml  Net             -966 ml   Filed Weights   04/26/17 0416 04/26/17 2055 05/12/17 1112  Weight: 85.5 kg (188 lb 7.9 oz) 88.7 kg (195 lb 8.8 oz) 83.7 kg (184 lb 9.6 oz)   Exam  General: Well developed, well nourished, NAD, appears stated age  82: NCAT, mucous membranes moist. Right craniectomy   Cardiovascular: S1 S2 auscultated, no murmurs, RRR  Respiratory: Clear to auscultation bilaterally with equal chest rise  Abdomen: Soft, nontender, nondistended, + bowel sounds  Extremities: warm dry without cyanosis clubbing or edema  Neuro: AAOx3, no new deficits  Psych: Appropriate mood and affect, pleasant    Data Reviewed: I have personally reviewed following labs and imaging studies  CBC:  Recent Labs Lab 05/10/17 0307 05/11/17 0258 05/12/17 0536 05/13/17 0524 05/15/17 0507  WBC 11.2* 13.0* 13.8* 14.7* 13.0*  HGB 10.8* 11.2* 12.3* 12.1* 11.9*  HCT 36.4* 38.2* 38.9* 40.3 40.0  MCV 93.6 92.9 91.5 92.4 93.5  PLT 208 203 204 251 220   Basic Metabolic Panel:  Recent Labs Lab 05/11/17 0258 05/15/17 0507  NA 136 137  K 4.9 4.5  CL 101 102  CO2 26 27  GLUCOSE 105* 142*  BUN 28* 28*  CREATININE 0.72 0.75  CALCIUM 10.5* 10.6*   GFR: Estimated Creatinine Clearance: 106.5 mL/min (by C-G formula based on SCr of 0.75 mg/dL). Liver Function Tests: No results for input(s): AST, ALT, ALKPHOS, BILITOT, PROT, ALBUMIN  in the last 168 hours. No results for input(s): LIPASE, AMYLASE in the last 168 hours. No results for input(s): AMMONIA in the last 168 hours. Coagulation Profile: No results for input(s): INR, PROTIME in the last 168 hours. Cardiac Enzymes: No results for input(s): CKTOTAL, CKMB, CKMBINDEX, TROPONINI in the last 168 hours. BNP (last 3 results) No results for input(s): PROBNP in the last 8760 hours. HbA1C: No results for input(s): HGBA1C in the last 72 hours. CBG:  Recent Labs Lab 05/14/17 1146 05/14/17 1640 05/14/17  2108 05/15/17 0625 05/15/17 1123  GLUCAP 134* 109* 112* 155* 160*   Lipid Profile: No results for input(s): CHOL, HDL, LDLCALC, TRIG, CHOLHDL, LDLDIRECT in the last 72 hours. Thyroid Function Tests: No results for input(s): TSH, T4TOTAL, FREET4, T3FREE, THYROIDAB in the last 72 hours. Anemia Panel: No results for input(s): VITAMINB12, FOLATE, FERRITIN, TIBC, IRON, RETICCTPCT in the last 72 hours. Urine analysis:    Component Value Date/Time   COLORURINE YELLOW 05/02/2017 1353   APPEARANCEUR CLOUDY (A) 05/02/2017 1353   LABSPEC 1.017 05/02/2017 1353   PHURINE 7.0 05/02/2017 1353   GLUCOSEU NEGATIVE 05/02/2017 1353   HGBUR NEGATIVE 05/02/2017 1353   BILIRUBINUR NEGATIVE 05/02/2017 1353   KETONESUR NEGATIVE 05/02/2017 1353   PROTEINUR 100 (A) 05/02/2017 1353   NITRITE NEGATIVE 05/02/2017 1353   LEUKOCYTESUR NEGATIVE 05/02/2017 1353   Sepsis Labs: @LABRCNTIP (procalcitonin:4,lacticidven:4)  ) No results found for this or any previous visit (from the past 240 hour(s)).    Radiology Studies: No results found.   Scheduled Meds: . acetaminophen (TYLENOL) oral liquid 160 mg/5 mL  650 mg Per Tube TID  . apixaban  5 mg Oral BID  . chlorhexidine  15 mL Mouth Rinse BID  . feeding supplement (PRO-STAT SUGAR FREE 64)  30 mL Per Tube BID  . free water  250 mL Per Tube Q8H  . gabapentin  300 mg Oral QHS  . glycopyrrolate  1 mg Per Tube BID  . insulin aspart   0-20 Units Subcutaneous TID WC  . insulin aspart  0-5 Units Subcutaneous QHS  . insulin detemir  34 Units Subcutaneous Daily  . metoprolol tartrate  12.5 mg Per Tube BID  . pantoprazole sodium  40 mg Per Tube Daily   Continuous Infusions: . feeding supplement (GLUCERNA 1.2 CAL) 1,000 mL (05/13/17 1151)     LOS: 44 days   Time Spent in minutes   25 minutes  Antar Milks D.O. on 05/15/2017 at 1:42 PM  Between 7am to 7pm - Pager - [phone]  After 7pm go to [email or web address] - password [email or web address]  And look for the night coverage person covering for me after hours  [facility] Office  [phone]

## 2017-05-15 NOTE — Progress Notes (Signed)
Physical Therapy Treatment Patient Details Name: Joe Wilkinson MRN: [id number] DOB: 08-29-1960 Today's Date: 05/15/2017    History of Present Illness 56 yo male brought to [facility] with altered mental status from DKA and sepsis.  Found to have RT MCA CVA and transfer to [facility].  Required Rt decompressive craniectomy and remained on vent post op.ETT 8/23-9/5, s/p tracheostomy 9/5    PT Comments    Patient seen for activity progression. Tolerated EOB ~20 minutes today for task performance and sitting balance. Also tolerated 2 trails of sit <> stand with +2 assist. Will continue to see and progress as tolerated.   Follow Up Recommendations  SNF;Supervision/Assistance - 24 hour     Equipment Recommendations  None recommended by PT    Recommendations for Other Services       Precautions / Restrictions Precautions Precautions: Fall;Other (comment) Precaution Comments: craniectomy, no bone flap right side; trach in place; peg tube; L side neglect Restrictions Weight Bearing Restrictions: No Other Position/Activity Restrictions: craniectomy, no bone flap right side    Mobility  Bed Mobility Overal bed mobility: Needs Assistance Bed Mobility: Supine to Sit;Sit to Supine Rolling: Max assist;+2 for physical assistance   Supine to sit: Max assist;+2 for physical assistance;HOB elevated Sit to supine: +2 for physical assistance;Total assist   General bed mobility comments: pt able to use R UE to assist, able to move R LE off of bed, assist to elevate trunk and with L LE movement, use of bed pads to position pt's hips at EOB  Transfers Overall transfer level: Needs assistance Equipment used: 2 person hand held assist (face to face with gait belt and chuck pad) Transfers: Sit to/from Stand Sit to Stand: Max assist;+2 physical assistance         General transfer comment: Max assist of 2 to power up to standing today x2. LLE blocked out at knee and hip. Gait belt and chuck pad utilized.  Multi modal cues to facilitate upright posture  Ambulation/Gait         Gait velocity: unable   General Gait Details: unable   Stairs            Wheelchair Mobility    Modified Rankin (Stroke Patients Only) Modified Rankin (Stroke Patients Only) Pre-Morbid Rankin Score: No symptoms Modified Rankin: Severe disability     Balance Overall balance assessment: Needs assistance Sitting-balance support: Feet supported;Single extremity supported Sitting balance-Leahy Scale: Poor Sitting balance - Comments: pt required min guard without distraction and min to moderate assist when attempting task performance. Improved awareness of midline and attempts to reach for self correction with RUE but unable to maintain Postural control: Left lateral lean;Posterior lean   Standing balance-Leahy Scale: Zero Standing balance comment: +2 assist to stand with multi modal support/knee blocking LLE                            Cognition Arousal/Alertness: Awake/alert Behavior During Therapy: Flat affect Overall Cognitive Status: Impaired/Different from baseline Area of Impairment: Problem solving;Awareness;Safety/judgement;Following commands;Attention;Memory                   Current Attention Level: Sustained Memory: Decreased recall of precautions;Decreased short-term memory Following Commands: Follows one step commands with increased time;Follows one step commands inconsistently;Follows multi-step commands inconsistently;Follows multi-step commands with increased time Safety/Judgement: Decreased awareness of safety;Decreased awareness of deficits Awareness: Intellectual Problem Solving: Slow processing;Decreased initiation;Difficulty sequencing;Requires verbal cues;Requires tactile cues General Comments: Used PMV during session, patient  with some noted awareness and memory but at other points lacks insight into deficits. Was able to recall conversation about surgery for  blood build up in his head but then states that we need to let him walk to the bathroom even though he can not stand      Exercises      General Comments        Pertinent Vitals/Pain Pain Assessment: Faces Faces Pain Scale: Hurts little more Pain Location: left knee Pain Descriptors / Indicators: Throbbing;Discomfort;Grimacing;Guarding Pain Intervention(s): Repositioned;Monitored during session    Home Living                      Prior Function            PT Goals (current goals can now be found in the care plan section) Acute Rehab PT Goals Patient Stated Goal: none stated PT Goal Formulation: Patient unable to participate in goal setting Time For Goal Achievement: 05/18/17 Potential to Achieve Goals: Fair Progress towards PT goals: Progressing toward goals    Frequency    Min 3X/week      PT Plan Current plan remains appropriate    Co-evaluation PT/OT/SLP Co-Evaluation/Treatment: Yes Reason for Co-Treatment: Complexity of the patient's impairments (multi-system involvement);For patient/therapist safety;Necessary to address cognition/behavior during functional activity;To address functional/ADL transfers PT goals addressed during session: Mobility/safety with mobility;Balance        AM-PAC PT "6 Clicks" Daily Activity  Outcome Measure  Difficulty turning over in bed (including adjusting bedclothes, sheets and blankets)?: Unable Difficulty moving from lying on back to sitting on the side of the bed? : Unable Difficulty sitting down on and standing up from a chair with arms (e.g., wheelchair, bedside commode, etc,.)?: Unable Help needed moving to and from a bed to chair (including a wheelchair)?: Total Help needed walking in hospital room?: Total Help needed climbing 3-5 steps with a railing? : Total 6 Click Score: 6    End of Session Equipment Utilized During Treatment: Oxygen Activity Tolerance: Patient tolerated treatment well;Other (comment)  (pt refusing to perform any bed mobility or sitting EOB) Patient left: in bed;with call bell/phone within reach;with bed alarm set Nurse Communication: Mobility status;Need for lift equipment PT Visit Diagnosis: Other symptoms and signs involving the nervous system (R29.898);Hemiplegia and hemiparesis Hemiplegia - Right/Left: Left Hemiplegia - dominant/non-dominant: Non-dominant Hemiplegia - caused by: Nontraumatic intracerebral hemorrhage     Time: 0272-5366 PT Time Calculation (min) (ACUTE ONLY): 31 min  Charges:  $Therapeutic Activity: 8-22 mins                    G Codes:       Alben Deeds, PT DPT  Board Certified Neurologic Specialist [phone]    Duncan Dull 05/15/2017, 4:35 PM

## 2017-05-15 NOTE — Progress Notes (Signed)
Occupational Therapy Treatment Patient Details Name: Joe Wilkinson MRN: [id number] DOB: 04/01/1961 Today's Date: 05/15/2017    History of present illness 56 yo male brought to [facility] with altered mental status from DKA and sepsis.  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.  Required Rt decompressive craniectomy and remained on vent post op.ETT 8/23-9/5, s/p tracheostomy 9/5   OT comments  Pt progressing towards established OT goals. Pt performing oral care at EOB with set up and Mod A to correct Lateral lean to L. Pt requiring Max cues to attend to L side and continues to present with decreased memory, attention, and problem solving. Pt performed sit<>stand twice with Max A +2 and blocking of bil knees. Continue to recommend dc to post-acute rehab. Will continues to follow acutely.    Follow Up Recommendations  SNF;Supervision/Assistance - 24 hour    Equipment Recommendations   (Defer to next venue)    Recommendations for Other Services      Precautions / Restrictions Precautions Precautions: Fall;Other (comment) Precaution Comments: craniectomy, no bone flap right side; trach in place; peg tube; L side neglect Restrictions Weight Bearing Restrictions: No Other Position/Activity Restrictions: craniectomy, no bone flap right side       Mobility Bed Mobility Overal bed mobility: Needs Assistance Bed Mobility: Supine to Sit;Sit to Supine Rolling: Max assist;+2 for physical assistance   Supine to sit: Max assist;+2 for physical assistance;HOB elevated Sit to supine: +2 for physical assistance;Total assist   General bed mobility comments: pt able to use R UE to assist, able to move R LE off of bed, assist to elevate trunk and with L LE movement, use of bed pads to position pt's hips at EOB  Transfers Overall transfer level: Needs assistance Equipment used: 2 person hand held assist (face to face with gait belt and chuck pad) Transfers: Sit to/from Stand Sit to Stand: Max  assist;+2 physical assistance         General transfer comment: Max assist of 2 to power up to standing today x2. LLE blocked out at knee and hip. Gait belt and chuck pad utilized. Multi modal cues to facilitate upright posture    Balance Overall balance assessment: Needs assistance Sitting-balance support: Feet supported;Single extremity supported Sitting balance-Leahy Scale: Poor Sitting balance - Comments: pt required min guard without distraction and min to moderate assist when attempting task performance. Improved awareness of midline and attempts to reach for self correction with RUE but unable to maintain Postural control: Left lateral lean;Posterior lean   Standing balance-Leahy Scale: Zero Standing balance comment: +2 assist to stand with multi modal support/knee blocking LLE                           ADL either performed or assessed with clinical judgement   ADL Overall ADL's : Needs assistance/impaired     Grooming: Oral care;Sitting;Cueing for sequencing;Cueing for safety;Moderate assistance                               Functional mobility during ADLs: Maximal assistance;+2 for physical assistance;Cueing for sequencing (Sit<>Stand only) General ADL Comments: Pt performed oral care with set up at EOB. Pt requiring Max cues to attend to L side to locate grooming items. Pt requiring Max hand over hand to use L hand during bilateral coordination tasks such as holding tooth paste to twist off cap.  Pt stating that  he does not like looking at his L side and requiring increased encouragement     Vision   Additional Comments: L inattention and requires Max cues to attend to midline and L side   Perception     Praxis      Cognition Arousal/Alertness: Awake/alert Behavior During Therapy: Flat affect Overall Cognitive Status: Impaired/Different from baseline Area of Impairment: Problem solving;Awareness;Safety/judgement;Following  commands;Attention;Memory                   Current Attention Level: Sustained Memory: Decreased recall of precautions;Decreased short-term memory Following Commands: Follows one step commands with increased time;Follows one step commands inconsistently;Follows multi-step commands inconsistently;Follows multi-step commands with increased time Safety/Judgement: Decreased awareness of safety;Decreased awareness of deficits Awareness: Intellectual Problem Solving: Slow processing;Decreased initiation;Difficulty sequencing;Requires verbal cues;Requires tactile cues General Comments: Pt requiring verbal and visual cues to attend to L side and sequence ADLs. Pt stating "I dont like my L side. Move the tooth brush to me R." Used PMV during session, patient with some noted awareness and memory but at other points lacks insight into deficits. Was able to recall conversation about surgery for blood build up in his head but then states that we need to let him walk to the bathroom even though he can not stand        Exercises Exercises: Other exercises   Shoulder Instructions       General Comments      Pertinent Vitals/ Pain       Pain Assessment: Faces Faces Pain Scale: Hurts little more Pain Location: left knee Pain Descriptors / Indicators: Throbbing;Discomfort;Grimacing;Guarding Pain Intervention(s): Monitored during session;Repositioned  Home Living                                          Prior Functioning/Environment              Frequency  Min 2X/week        Progress Toward Goals  OT Goals(current goals can now be found in the care plan section)  Progress towards OT goals: Progressing toward goals  Acute Rehab OT Goals Patient Stated Goal: none stated ADL Goals Pt Will Perform Grooming: with min assist;sitting Additional ADL Goal #1: Pt will perform bed mobility as precursor to ADL at min A level Additional ADL Goal #2: Pt will track items  with multimodal cues past mdline 50% of the time Additional ADL Goal #3: Pt will tolerate sitting EOB with min A as precursor to performing ADL  Plan Discharge plan remains appropriate    Co-evaluation    PT/OT/SLP Co-Evaluation/Treatment: Yes Reason for Co-Treatment: Complexity of the patient's impairments (multi-system involvement) PT goals addressed during session: Mobility/safety with mobility;Balance OT goals addressed during session: ADL's and self-care      AM-PAC PT "6 Clicks" Daily Activity     Outcome Measure   Help from another person eating meals?: A Lot Help from another person taking care of personal grooming?: A Lot Help from another person toileting, which includes using toliet, bedpan, or urinal?: Total Help from another person bathing (including washing, rinsing, drying)?: Total Help from another person to put on and taking off regular upper body clothing?: A Lot Help from another person to put on and taking off regular lower body clothing?: Total 6 Click Score: 9    End of Session Equipment Utilized During Treatment: Oxygen  OT Visit Diagnosis:  Unsteadiness on feet (R26.81);Muscle weakness (generalized) (M62.81);Low vision, both eyes (H54.2);Other symptoms and signs involving the nervous system (R29.898);Other symptoms and signs involving cognitive function;Hemiplegia and hemiparesis;Pain Hemiplegia - Right/Left: Left Hemiplegia - dominant/non-dominant: Non-Dominant Hemiplegia - caused by: Cerebral infarction Pain - Right/Left: Right Pain - part of body:  (Head)   Activity Tolerance Patient tolerated treatment well   Patient Left in bed;with call bell/phone within reach;with bed alarm set   Nurse Communication Mobility status        Time: 5797-2820 OT Time Calculation (min): 31 min  Charges: OT General Charges $OT Visit: 1 Visit OT Treatments $Self Care/Home Management : 8-22 mins  [place], OTR/L Acute Rehab Pager:  [phone] Office: [place] 05/15/2017, 5:17 PM

## 2017-05-15 NOTE — Progress Notes (Signed)
Visit  to patients room to check on trach . Trach found dislodged and almost out slid to the left side.  Pt spo2 was 99 % no distress noted , I repositioned trach correctly and tightened trach straps.. Advised pt not to pull on or loosen.  Spoke to RN and make aware advised her to call me if she has any issues regarding trach.

## 2017-05-15 NOTE — Care Management Note (Signed)
Case Management Note  Patient Details  Name: Joe Wilkinson MRN: [id number] Date of Birth: 1960/11/29  Subjective/Objective:                    Action/Plan: Pt continues on difficult to place list for SNF placement. CM following.  Expected Discharge Date:  04/28/17               Expected Discharge Plan:  [place]  In-House Referral:  Clinical Social Work, Scientist, research (medical)  CM Consult  Post Acute Care Choice:    Choice offered to:     DME Arranged:    DME Agency:     HH Arranged:    [place] Agency:     Status of Service:  In process, will continue to follow  If discussed at Long Length of Stay Meetings, dates discussed:    Additional Comments:  Pollie Friar, RN 05/15/2017, 11:15 AM

## 2017-05-16 LAB — GLUCOSE, CAPILLARY
GLUCOSE-CAPILLARY: 101 mg/dL — AB (ref 65–99)
GLUCOSE-CAPILLARY: 135 mg/dL — AB (ref 65–99)
Glucose-Capillary: 135 mg/dL — ABNORMAL HIGH (ref 65–99)
Glucose-Capillary: 153 mg/dL — ABNORMAL HIGH (ref 65–99)

## 2017-05-16 NOTE — Progress Notes (Signed)
Physical Therapy Treatment Patient Details Name: Joe Wilkinson MRN: [id number] DOB: Jun 30, 1961 Today's Date: 05/16/2017    History of Present Illness 56 yo male brought to [facility] with altered mental status from DKA and sepsis.  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  Required Rt decompressive craniectomy and remained on vent post op.ETT 8/23-9/5, s/p tracheostomy 9/5    PT Comments    Patient seen for transfer activity. Max 2 person transfer OOB to [facility], initial sitting balance maintained but as patient fatigued required continued assist. Max of 2 to return to bed. Current POC remains appropriate.   Follow Up Recommendations  SNF;Supervision/Assistance - 24 hour     Equipment Recommendations  None recommended by PT    Recommendations for Other Services       Precautions / Restrictions Precautions Precautions: Fall;Other (comment) Precaution Comments: craniectomy, no bone flap right side; trach in place; peg tube; L side neglect Restrictions Weight Bearing Restrictions: No Other Position/Activity Restrictions: craniectomy, no bone flap right side    Mobility  Bed Mobility Overal bed mobility: Needs Assistance Bed Mobility: Supine to Sit;Sit to Supine Rolling: Max assist;+2 for physical assistance   Supine to sit: Max assist;+2 for physical assistance;HOB elevated Sit to supine: +2 for physical assistance;Total assist   General bed mobility comments: pt able to use R UE to assist, able to move R LE off of bed, assist to elevate trunk and with L LE movement, use of bed pads to position pt's hips at EOB  Transfers Overall transfer level: Needs assistance Equipment used: 2 person hand held assist (face to face with gait belt and chuck pad) Transfers: Sit to/from Stand Sit to Stand: Max assist;+2 physical assistance   Squat pivot transfers: Total assist;+2 physical assistance (to [facility] and back to bed)     General transfer comment: Max assist face to face with gait belt  and 2 person, BLEs blocked out and cues to pull upright via therapist assist during transition to standing and pivot  Ambulation/Gait         Gait velocity: unable   General Gait Details: unable   Stairs            Wheelchair Mobility    Modified Rankin (Stroke Patients Only) Modified Rankin (Stroke Patients Only) Pre-Morbid Rankin Score: No symptoms Modified Rankin: Severe disability     Balance Overall balance assessment: Needs assistance Sitting-balance support: Feet supported;Single extremity supported Sitting balance-Leahy Scale: Poor Sitting balance - Comments: pt required min guard without distraction and min to moderate assist when attempting task performance. Improved awareness of midline and attempts to reach for self correction with RUE but unable to maintain Postural control: Left lateral lean;Posterior lean   Standing balance-Leahy Scale: Zero Standing balance comment: +2 assist to stand with multi modal support/knee blocking LLE                            Cognition Arousal/Alertness: Awake/alert Behavior During Therapy: Flat affect Overall Cognitive Status: Impaired/Different from baseline Area of Impairment: Problem solving;Awareness;Safety/judgement;Following commands;Attention;Memory                   Current Attention Level: Sustained Memory: Decreased recall of precautions;Decreased short-term memory Following Commands: Follows one step commands with increased time;Follows one step commands inconsistently;Follows multi-step commands inconsistently;Follows multi-step commands with increased time Safety/Judgement: Decreased awareness of safety;Decreased awareness of deficits Awareness: Intellectual Problem Solving: Slow processing;Decreased initiation;Difficulty sequencing;Requires verbal cues;Requires tactile cues  Exercises      General Comments        Pertinent Vitals/Pain Pain Assessment: Faces Faces Pain  Scale: Hurts little more Pain Location: left knee Pain Descriptors / Indicators: Grimacing;Guarding Pain Intervention(s): Monitored during session    Home Living                      Prior Function            PT Goals (current goals can now be found in the care plan section) Acute Rehab PT Goals Patient Stated Goal: none stated PT Goal Formulation: Patient unable to participate in goal setting Time For Goal Achievement: 05/18/17 Potential to Achieve Goals: Fair Progress towards PT goals: Progressing toward goals    Frequency    Min 3X/week      PT Plan Current plan remains appropriate    Co-evaluation              AM-PAC PT "6 Clicks" Daily Activity  Outcome Measure  Difficulty turning over in bed (including adjusting bedclothes, sheets and blankets)?: Unable Difficulty moving from lying on back to sitting on the side of the bed? : Unable Difficulty sitting down on and standing up from a chair with arms (e.g., wheelchair, bedside commode, etc,.)?: Unable Help needed moving to and from a bed to chair (including a wheelchair)?: Total Help needed walking in hospital room?: Total Help needed climbing 3-5 steps with a railing? : Total 6 Click Score: 6    End of Session Equipment Utilized During Treatment: Oxygen Activity Tolerance: Patient tolerated treatment well;Other (comment) (pt refusing to perform any bed mobility or sitting EOB) Patient left: in bed;with call bell/phone within reach;with bed alarm set Nurse Communication: Mobility status;Need for lift equipment PT Visit Diagnosis: Other symptoms and signs involving the nervous system (R29.898);Hemiplegia and hemiparesis Hemiplegia - Right/Left: Left Hemiplegia - dominant/non-dominant: Non-dominant Hemiplegia - caused by: Nontraumatic intracerebral hemorrhage     Time: 0932-6712 PT Time Calculation (min) (ACUTE ONLY): 21 min  Charges:  $Therapeutic Activity: 8-22 mins                    G  Codes:       Alben Deeds, PT DPT  Board Certified Neurologic Specialist [phone]    Duncan Dull 05/16/2017, 5:21 PM

## 2017-05-16 NOTE — Progress Notes (Signed)
Occupational Therapy Treatment Patient Details Name: Joe Wilkinson MRN: [id number] DOB: 1960-10-07 Today's Date: 05/16/2017    History of present illness 56 yo male brought to [facility] with altered mental status from DKA and sepsis.  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  Required Rt decompressive craniectomy and remained on vent post op.ETT 8/23-9/5, s/p tracheostomy 9/5   OT comments  Pt progressing towards established OT goals. Pt performing grooming task with EOB with Min A and Max cues to correct after L lateral lean. Grooming set up with basin on L side to increase attention to L side, righting reactions, and use of PNF functional patterns. Will continue to follow acutely and continue to recommend post-acute OT to optimize safety and independence with ADLs ans functional mobility.    Follow Up Recommendations  SNF;Supervision/Assistance - 24 hour    Equipment Recommendations   (Defer to next venue)    Recommendations for Other Services      Precautions / Restrictions Precautions Precautions: Fall;Other (comment) Precaution Comments: craniectomy, no bone flap right side; trach in place; peg tube; L side neglect Restrictions Weight Bearing Restrictions: No Other Position/Activity Restrictions: craniectomy, no bone flap right side       Mobility Bed Mobility Overal bed mobility: Needs Assistance Bed Mobility: Supine to Sit;Sit to Supine Rolling: +2 for physical assistance;Max assist   Supine to sit: +2 for physical assistance;HOB elevated;Mod assist Sit to supine: +2 for physical assistance;Max assist   General bed mobility comments: Pt performing bed mobility to L side with use of bed rails to roll and then push into sitting. Pt requiring Mod A +2. Educated pt on bringing R ankle under L to increase independence  Transfers Overall transfer level: Needs assistance Equipment used:  (face to face with gait belt and chuck pad) Transfers: Sit to/from Stand Sit to Stand:  Max assist;+2 physical assistance   Squat pivot transfers:  (to [facility] and back to bed)     General transfer comment: Max assist face to face with gait belt and 2 person, BLEs blocked out and cues to pull upright via therapist assist during transition to standing and pivot    Balance Overall balance assessment: Needs assistance Sitting-balance support: Feet supported;Single extremity supported Sitting balance-Leahy Scale: Poor Sitting balance - Comments: pt required min guard without distraction and min to moderate assist when attempting task performance. Improved awareness of midline and attempts to reach for self correction with RUE but unable to maintain Postural control: Left lateral lean;Posterior lean   Standing balance-Leahy Scale: Zero Standing balance comment: +2 assist to stand with multi modal support/knee blocking LLE                           ADL either performed or assessed with clinical judgement   ADL Overall ADL's : Needs assistance/impaired Eating/Feeding: NPO   Grooming: Wash/dry hands;Minimal assistance;Sitting;Cueing for sequencing Grooming Details (indicate cue type and reason): Pt performing groomign task with wash basin on L side requiring pt to lean down and to L. Pt maintaining sitting balance with Min physical A to correct and Max cues.                             Functional mobility during ADLs:  (Sit<>Stand only) General ADL Comments: Pt performed grooming at EOB with set up to increase attention to L side. Pt also performing trunk excercises to increase trunk  stability and righting reactions.      Vision   Vision Assessment?: Vision impaired- to be further tested in functional context   Perception     Praxis      Cognition Arousal/Alertness: Awake/alert Behavior During Therapy: Flat affect Overall Cognitive Status: Impaired/Different from baseline Area of Impairment: Problem solving;Awareness;Safety/judgement;Following  commands;Attention;Memory                   Current Attention Level: Sustained Memory: Decreased recall of precautions;Decreased short-term memory Following Commands: Follows one step commands with increased time;Follows one step commands inconsistently;Follows multi-step commands inconsistently;Follows multi-step commands with increased time Safety/Judgement: Decreased awareness of safety;Decreased awareness of deficits Awareness: Intellectual Problem Solving: Slow processing;Decreased initiation;Difficulty sequencing;Requires verbal cues;Requires tactile cues General Comments: Pt requiring cues to attend to L side.         Exercises Exercises: Other exercises Other Exercises Other Exercises: Weight bearing through LUE throughout session Other Exercises: Trunk extenion and flexion with tactile cues   Shoulder Instructions       General Comments      Pertinent Vitals/ Pain       Pain Assessment: Faces Faces Pain Scale: Hurts little more Pain Location: Left leg and hand Pain Descriptors / Indicators: Grimacing;Guarding Pain Intervention(s): Monitored during session;Repositioned  Home Living                                          Prior Functioning/Environment              Frequency  Min 2X/week        Progress Toward Goals  OT Goals(current goals can now be found in the care plan section)  Progress towards OT goals: Progressing toward goals  Acute Rehab OT Goals Patient Stated Goal: none stated OT Goal Formulation: Patient unable to participate in goal setting ADL Goals Pt Will Perform Grooming: with min assist;sitting Additional ADL Goal #1: Pt will perform bed mobility as precursor to ADL at min A level Additional ADL Goal #2: Pt will track items with multimodal cues past mdline 50% of the time Additional ADL Goal #3: Pt will tolerate sitting EOB with min A as precursor to performing ADL  Plan Discharge plan remains appropriate     Co-evaluation                 AM-PAC PT "6 Clicks" Daily Activity     Outcome Measure   Help from another person eating meals?: A Lot Help from another person taking care of personal grooming?: A Lot Help from another person toileting, which includes using toliet, bedpan, or urinal?: Total Help from another person bathing (including washing, rinsing, drying)?: Total Help from another person to put on and taking off regular upper body clothing?: A Lot Help from another person to put on and taking off regular lower body clothing?: Total 6 Click Score: 9    End of Session Equipment Utilized During Treatment: Oxygen  OT Visit Diagnosis: Unsteadiness on feet (R26.81);Muscle weakness (generalized) (M62.81);Low vision, both eyes (H54.2);Other symptoms and signs involving the nervous system (R29.898);Other symptoms and signs involving cognitive function;Hemiplegia and hemiparesis;Pain Hemiplegia - Right/Left: Left Hemiplegia - dominant/non-dominant: Non-Dominant Hemiplegia - caused by: Cerebral infarction Pain - Right/Left: Right Pain - part of body:  (Head)   Activity Tolerance Patient tolerated treatment well   Patient Left in bed;with call bell/phone within reach;with bed alarm set  Nurse Communication Mobility status        Time: 8403-7543 OT Time Calculation (min): 35 min  Charges: OT General Charges $OT Visit: 1 Visit OT Treatments $Self Care/Home Management : 8-22 mins $Therapeutic Activity: 8-22 mins  [place], OTR/L Acute Rehab Pager: [phone] Office: [place] 05/16/2017, 5:52 PM

## 2017-05-16 NOTE — Procedures (Signed)
Tracheostomy Change Note  Patient Details:   Name: Joe Wilkinson DOB: 04-01-1961 MRN: [id number]    Airway Documentation:     Evaluation  O2 sats: stable throughout Complications: No apparent complications Patient did tolerate procedure well. Bilateral Breath Sounds: Clear     Patient trach changed, per protocol, from #4 cuffless Shiley to another #4 cuffless Shiley.  Positive color change noted.  Bilateral breath sounds auscultated.  Patient sats at 96%.  Vitals are stable.  Patient tolerated well.   Alphia Moh N 05/16/2017, 3:55 PM

## 2017-05-16 NOTE — Progress Notes (Signed)
PROGRESS NOTE    Joe Wilkinson  IHK:[id number] DOB: [birth year] DOA: 03/31/2017 PCP: Patient, No Pcp Per   No chief complaint on file.   Brief Narrative:  HPI on 04/01/2017 by Dr. Hanley Hays (PCCM) [id number] M found unresponsive at bustop admitted to [facility] in DKA, concern for sepsis w/o source, later developed hemiparesis and found to have acute Right MCA CVA.  Now transferred to [facility] ICU for NeuroSx care, seen by NSX and Neuro.  Tentative plan for crani in AM. He is intubated and sedated and unable to provide any HPI, ROS, PMH, PSH, FH, home meds, allergies.  All Hx form medical record.  Interim history  He was transferred to the ICU and underwent decompressive right craniectomy 8/25. Hypertonic saline protocol was completed. He required transfusion support and was treated for AFib with RVR, continued on the ventilator, eventually receiving trach 9/5 and PEG 9/6, on ATC 9/7. Patient does not have health insurance and because of his craniectomy without the ability to fit a helmet, most skilled nursing facilities areunwilling to accept him.  Currently he is getting meds thru PEG and continuous TF. Heparin was converted to prophylaxis Lovenox. He was transferred from [facility] service to [facility] service on 9/25. Currently awaiting placement to snf.  Assessment & Plan   Acute respiratory failure with hypoxia and hypercarbia  -Continue pulmonary hygiene  -s/p trach -Seen by PCCM, did not feel patient was ready for decannulation given his aspiration risks and suctioning requirements -Continue ATC as tolerated  Septic shock secondary to S. 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 Leonie Man, recommended anticoagulation (FOBT negative), started on Eliquis  -LDL 38, hemoglobin A1C 14.3 -Repeat CT had showed further swelling in the region of the complete right MCA territory infarction with more extensive bulging of the brain through the craniectomy defect. No midline shift however because of decompressive craniotomy. At superior margin, there could be some extension of the swollen brain through a dural defect. At the superior margin, small amount of petechial bleeding within the region of the infarction. -PT/OT rec SNF  Atrial fibrillation with RVR, new onset -Currently in sinus rhythm -CHADSVASC 4 -Patient was not placed on anticoagulation as he was found to be anemic and required 4 units PRBC transfusion this admission -Continue Eliquis and metoprolol  Diabetes mellitus, type II -Hemoglobin A1c 14.3 -Continue Levemir, insulin sliding scale CBG monitoring  Anemia blood loss -Unclear source -Abdominal x-ray does not show bleeding, FOBT negative -Hemoglobin 11.9, appears to be stable of the past several weeks -Continue to monitor CBC periodically   Escherichia coli, Klebsiella UTI -Completed 10 days of keflex on 05/15/2017  Nutrition/dysphagia -Speech therapy recommended dysphagia 1 diet, patient currently on tube feeds -Patient did have some abdominal distention on 05/06/2017, abdominal film does not show ileus or obstruction -Continue to monitor  Peri-anal cellulitis with small abscess -Gen. surgery consulted and appreciated, determined neurosurgical drainage necessary -Patient was on clindamycin through 04/29/2017  DVT Prophylaxis  Eliquis   Code Status: Full  Family Communication: None at bedside  Disposition Plan: Admitted, pending SNF    Consultants  PCCM Neurology Neurosurgery General surgery   Procedures  -LP 8/24 >> glucose 205, RBC 16, protein 62, WBC 5 -CT head 8/24 >> Large Rt MCA -8/25 decompressive craniectomy -EEG 8/27 >> bilateral cerebral dysfunction, non-specific in etiology -Echo 8/27 >> EF 65 to 70% -CT abd/pelvis 8/30 >> third spacing, 3.5 cm low attenuation Lt hepatic lobe -CT Head 9/3>> Severe right hemisphere cytotoxic edema with stable extension of edematous brain through the right craniectomy defect. Stable petechial hemorrhage without malignant hemorrhagicTransformation. Stable intracranial mass effect with leftward midline shift of 7 mm. Small left PCA territory infarct. -9/5 Trach placement.  -9/6 PEG tube placement.  -CXR 9/23 > LLL atx and small effusion -Barrium swallow 9/21 > severe oral and mod pharyngeal dysphagia; mod to severe aspiration risk -CT head on 10/1 shows Further swelling in the region of the complete right MCA territory infarction with more extensive bulging of the brain through the craniectomy defect. No midline shift.   Antibiotics   Anti-infectives    Start     Dose/Rate Route Frequency Ordered Stop   05/05/17 2200  cephALEXin (KEFLEX) capsule 500 mg     500 mg Oral Every 12 hours 05/05/17 1640 05/15/17 1042   04/26/17 0600  vancomycin (VANCOCIN) IVPB 750 mg/150 ml premix  Status:  Discontinued     750 mg 150 mL/hr over 60 Minutes Intravenous Every 12 hours 04/25/17 1729 04/25/17 1730   04/25/17 1830  clindamycin (CLEOCIN) capsule 300 mg     300 mg Oral Every 6 hours 04/25/17 1730 04/29/17 1717   04/25/17 1800  vancomycin (VANCOCIN) IVPB 750 mg/150 ml premix  Status:  Discontinued     750 mg 150 mL/hr over 60 Minutes Intravenous Every 12 hours 04/25/17 1728 04/25/17 1729   04/23/17 0945  piperacillin-tazobactam (ZOSYN) IVPB 3.375 g  Status:  Discontinued     3.375 g 12.5 mL/hr over 240 Minutes Intravenous Every 8 hours 04/23/17 0930 04/25/17 1730   04/23/17 0430   vancomycin (VANCOCIN) IVPB 1000 mg/200 mL premix  Status:  Discontinued     1,000 mg 200 mL/hr over 60 Minutes Intravenous Every 12 hours 04/22/17 1655 04/25/17 1728   04/22/17 1630  vancomycin (VANCOCIN) IVPB 1000 mg/200 mL premix     1,000 mg 200 mL/hr over 60 Minutes Intravenous  Once 04/22/17 1627 04/22/17 1826   04/08/17 0200  piperacillin-tazobactam (ZOSYN) IVPB 3.375 g  Status:  Discontinued     3.375 g 12.5 mL/hr over 240 Minutes Intravenous Every 8 hours 04/08/17 0058 04/15/17 1601   04/08/17 0100  piperacillin-tazobactam (ZOSYN) IVPB 3.375 g  Status:  Discontinued     3.375 g 100 mL/hr over 30 Minutes Intravenous Every 6 hours 04/08/17 0054 04/08/17 0057   04/03/17 1200  ampicillin (OMNIPEN) 2 g in sodium chloride 0.9 % 50 mL IVPB     2 g 150 mL/hr over 20 Minutes Intravenous Every 6 hours 04/03/17 1005 04/06/17 1837   04/01/17 1800  vancomycin (VANCOCIN) IVPB 1000 mg/200 mL premix  Status:  Discontinued     1,000 mg 200 mL/hr over 60 Minutes Intravenous Every 24 hours 04/01/17 0146 04/01/17 0154   04/01/17 1600  ceFAZolin (ANCEF) IVPB 2g/100 mL premix     2 g 200 mL/hr over 30 Minutes Intravenous Every 8 hours 04/01/17 1100 04/02/17 0056   04/01/17 0828  bacitracin 50,000 Units in sodium chloride irrigation 0.9 % 500 mL irrigation  Status:  Discontinued       As needed 04/01/17 0837 04/01/17 0947  04/01/17 0600  piperacillin-tazobactam (ZOSYN) IVPB 3.375 g  Status:  Discontinued     3.375 g 12.5 mL/hr over 240 Minutes Intravenous Every 8 hours 04/01/17 0146 04/01/17 0154   04/01/17 0600  vancomycin (VANCOCIN) IVPB 750 mg/150 ml premix  Status:  Discontinued     750 mg 150 mL/hr over 60 Minutes Intravenous Every 12 hours 04/01/17 0225 04/03/17 0959   04/01/17 0400  piperacillin-tazobactam (ZOSYN) IVPB 3.375 g  Status:  Discontinued     3.375 g 12.5 mL/hr over 240 Minutes Intravenous Every 8 hours 04/01/17 0225 04/03/17 9937      Subjective:   Joe Wilkinson seen and  examined today.  Patient has no complaints today. Denies chest pain, shortness of breath, abdominal pain, N/V.  Objective:   Vitals:   05/16/17 0132 05/16/17 0536 05/16/17 0845 05/16/17 0907  BP: 104/67 111/60 127/72   Pulse: 86 90 99 93  Resp: 20 20 20 19   Temp: 98.5 F (36.9 C) 98.5 F (36.9 C) 98.6 F (37 C)   TempSrc: Oral Oral Oral   SpO2: 99% 96% 98% 98%  Weight:      Height:        Intake/Output Summary (Last 24 hours) at 05/16/17 1033 Last data filed at 05/16/17 0750  Gross per 24 hour  Intake              180 ml  Output             2050 ml  Net            -1870 ml   Filed Weights   04/26/17 0416 04/26/17 2055 05/12/17 1112  Weight: 85.5 kg (188 lb 7.9 oz) 88.7 kg (195 lb 8.8 oz) 83.7 kg (184 lb 9.6 oz)   Exam  General: Well developed, well nourished, NAD, appears stated age  63: NCAT, mucous membranes moist. Right craniectomy  Neck: +trach  Cardiovascular: S1 S2 auscultated, RRR, no murmurs  Respiratory: Clear to auscultation bilaterally with equal chest rise  Abdomen: Soft, nontender, nondistended, + bowel sounds  Extremities: warm dry without cyanosis clubbing or edema  Neuro: AAOx3, no new deficits  Psych: Normal affect and demeanor, pleasant  Data Reviewed: I have personally reviewed following labs and imaging studies  CBC:  Recent Labs Lab 05/10/17 0307 05/11/17 0258 05/12/17 0536 05/13/17 0524 05/15/17 0507  WBC 11.2* 13.0* 13.8* 14.7* 13.0*  HGB 10.8* 11.2* 12.3* 12.1* 11.9*  HCT 36.4* 38.2* 38.9* 40.3 40.0  MCV 93.6 92.9 91.5 92.4 93.5  PLT 208 203 204 251 169   Basic Metabolic Panel:  Recent Labs Lab 05/11/17 0258 05/15/17 0507  NA 136 137  K 4.9 4.5  CL 101 102  CO2 26 27  GLUCOSE 105* 142*  BUN 28* 28*  CREATININE 0.72 0.75  CALCIUM 10.5* 10.6*   GFR: Estimated Creatinine Clearance: 106.5 mL/min (by C-G formula based on SCr of 0.75 mg/dL). Liver Function Tests: No results for input(s): AST, ALT, ALKPHOS,  BILITOT, PROT, ALBUMIN in the last 168 hours. No results for input(s): LIPASE, AMYLASE in the last 168 hours. No results for input(s): AMMONIA in the last 168 hours. Coagulation Profile: No results for input(s): INR, PROTIME in the last 168 hours. Cardiac Enzymes: No results for input(s): CKTOTAL, CKMB, CKMBINDEX, TROPONINI in the last 168 hours. BNP (last 3 results) No results for input(s): PROBNP in the last 8760 hours. HbA1C: No results for input(s): HGBA1C in the last 72 hours. CBG:  Recent Labs Lab 05/15/17 [phone]  05/15/17 1123 05/15/17 1612 05/15/17 2114 05/16/17 0619  GLUCAP 155* 160* 116* 124* 135*   Lipid Profile: No results for input(s): CHOL, HDL, LDLCALC, TRIG, CHOLHDL, LDLDIRECT in the last 72 hours. Thyroid Function Tests: No results for input(s): TSH, T4TOTAL, FREET4, T3FREE, THYROIDAB in the last 72 hours. Anemia Panel: No results for input(s): VITAMINB12, FOLATE, FERRITIN, TIBC, IRON, RETICCTPCT in the last 72 hours. Urine analysis:    Component Value Date/Time   COLORURINE YELLOW 05/02/2017 1353   APPEARANCEUR CLOUDY (A) 05/02/2017 1353   LABSPEC 1.017 05/02/2017 1353   PHURINE 7.0 05/02/2017 1353   GLUCOSEU NEGATIVE 05/02/2017 1353   HGBUR NEGATIVE 05/02/2017 1353   BILIRUBINUR NEGATIVE 05/02/2017 1353   KETONESUR NEGATIVE 05/02/2017 1353   PROTEINUR 100 (A) 05/02/2017 1353   NITRITE NEGATIVE 05/02/2017 1353   LEUKOCYTESUR NEGATIVE 05/02/2017 1353   Sepsis Labs: @LABRCNTIP (procalcitonin:4,lacticidven:4)  ) No results found for this or any previous visit (from the past 240 hour(s)).    Radiology Studies: No results found.   Scheduled Meds: . acetaminophen (TYLENOL) oral liquid 160 mg/5 mL  650 mg Per Tube TID  . apixaban  5 mg Oral BID  . chlorhexidine  15 mL Mouth Rinse BID  . feeding supplement (PRO-STAT SUGAR FREE 64)  30 mL Per Tube BID  . free water  250 mL Per Tube Q8H  . gabapentin  300 mg Oral QHS  . glycopyrrolate  1 mg Per Tube  BID  . insulin aspart  0-20 Units Subcutaneous TID WC  . insulin aspart  0-5 Units Subcutaneous QHS  . insulin detemir  34 Units Subcutaneous Daily  . metoprolol tartrate  12.5 mg Per Tube BID  . pantoprazole sodium  40 mg Per Tube Daily   Continuous Infusions: . feeding supplement (GLUCERNA 1.2 CAL) 1,000 mL (05/16/17 1003)     LOS: 45 days   Time Spent in minutes   25 minutes  Kaiea Esselman D.O. on 05/16/2017 at 10:33 AM  Between 7am to 7pm - Pager - [phone]  After 7pm go to [email or web address] - password [email or web address]  And look for the night coverage person covering for me after hours  [facility] Office  [phone]

## 2017-05-16 NOTE — Progress Notes (Signed)
PULMONARY / CRITICAL CARE MEDICINE   Name: Joe Wilkinson MRN: [id number] DOB: 16-Feb-1961    ADMISSION DATE:  03/31/2017 CHIEF COMPLAINT:  CVA  HISTORY OF PRESENT ILLNESS:   56 yo male brought to [facility] with altered mental status from DKA and sepsis.  Found to have RT MCA CVA and transfer to [facility].  Required Rt decompressive craniectomy and remained on vent post op.  SUBJECTIVE:  No complaints from patient.  RN reports patient still requires suctioning, once last shift, but mostly non-productive Continues on ATC 28% and PMV while awake CXR 10/6 reviewed shows mild bibasilar atelectasis  Afebrile Awaiting SNF placement which is complicated by patient not having a helmet s/p craniectomy and not ready for decannulation  VITAL SIGNS: BP 111/60 (BP Location: Left Arm)   Pulse 90   Temp 98.5 F (36.9 C) (Oral)   Resp 20   Ht 5\' 10"  (1.778 m)   Wt 184 lb 9.6 oz (83.7 kg)   SpO2 96%   BMI 26.49 kg/m   VENTILATOR SETTINGS: FiO2 (%):  [28 %] 28 %  INTAKE / OUTPUT: I/O last 3 completed shifts: In: 300 [P.O.:300] Out: 1850 [Urine:1850]  PHYSICAL EXAMINATION:  General:  Chronically ill appearing male sitting in bed in NAD HEENT: MM pink/moist, #4 cuffless trach midline c/d/i Neuro: Awake, follows commands on R, left hemiparesis, R craniectomy site soft CV: rrr, no m/r/g PULM: even/non-labored, lungs bilaterally clear GI: soft, non-tender, bs active, PEG w/abd binder  Extremities: warm/dry, no edema  Skin: no rashes or lesions  BMET  Recent Labs Lab 05/11/17 0258 05/15/17 0507  NA 136 137  K 4.9 4.5  CL 101 102  CO2 26 27  BUN 28* 28*  CREATININE 0.72 0.75  GLUCOSE 105* 142*    Electrolytes  Recent Labs Lab 05/11/17 0258 05/15/17 0507  CALCIUM 10.5* 10.6*    CBC  Recent Labs Lab 05/12/17 0536 05/13/17 0524 05/15/17 0507  WBC 13.8* 14.7* 13.0*  HGB 12.3* 12.1* 11.9*  HCT 38.9* 40.3 40.0  PLT 204 251 226    Coag's No results for input(s): APTT, INR in  the last 168 hours.  Sepsis Markers No results for input(s): LATICACIDVEN, PROCALCITON, O2SATVEN in the last 168 hours.  ABG No results for input(s): PHART, PCO2ART, PO2ART in the last 168 hours.  Liver Enzymes No results for input(s): AST, ALT, ALKPHOS, BILITOT, ALBUMIN in the last 168 hours.  Cardiac Enzymes No results for input(s): TROPONINI, PROBNP in the last 168 hours.  Glucose  Recent Labs Lab 05/14/17 2108 05/15/17 0625 05/15/17 1123 05/15/17 1612 05/15/17 2114 05/16/17 0619  GLUCAP 112* 155* 160* 116* 124* 135*    Imaging No results found.  STUDIES:  LP 8/24 >> glucose 205, RBC 16, protein 62, WBC 5 CT head 8/24 >> Large Rt MCA EEG 8/27 >> bilateral cerebral dysfunction, non-specific in etiology Echo 8/27 >> EF 65 to 70% CT abd/pelvis 8/30 >> third spacing, 3.5 cm low attenuation Lt hepatic lobe CT Head 9/3>> Severe right hemisphere cytotoxic edema with stable extension of edematous brain through the right craniectomy defect. Stable petechial hemorrhage without malignant hemorrhagicTransformation. Stable intracranial mass effect with leftward midline shift of 7 mm. Small left PCA territory infarct. CXR 9/23 > LLL atx and small effusion Barrium swallow 9/21 > severe oral and mod pharyngeal dysphagia; mod to severe aspiration risk SLP 10/4 >> tolerating PMV, pocketing food on left  CULTURES: CSF 8/24>> negative BCX 8/24>> negative UCX 8/24>> negative Sputum 8/24>> S. Agalactiae Sputum 9/1>>  Consistent with normal resp. Flora UC 9/25 >> klebsiella (S-rocephin, imipenem), Ecoli (S- rocephin, imipenem)  ANTIBIOTICS: vanco 8/24 >> 8/26 Zosyn 8/24 >> 8/26, 9/1 >>9/8  Cleocin 9/18>>>9/22 Ampicillin 8/27 > 8/30  SIGNIFICANT EVENTS: 8/23 Admit [facility] 8/23 8/24 Transfer to [facility] after Rt MCA CVA, start 3% NS 8/25 Decompressive craniectomy 8/27 Transfuse 1 unit PRBC 8/28 Transfuse 1 unit PRBC, A fib with RVR 8/30 Transfuse 1 unit PRBC 8/31 Off pressors,  reintubated x 2, transfuse PRBC 9/5 Trach 9/6 PEG 9/14 Trach exchanged from 8 cuffed to 6 cuffless 9/22 Trach exchanged to 4 cuffless 10/01 Tolerating PMV, ATC 28% 10/04 ATC 28%, minimal secretions, tolerating PMV  LINES/TUBES: ETT 8/23 >>9/5 Trach [name]) 9/5 >> Lt IJ CVL 8/23 >>removed PEG 9/6 >>  ASSESSMENT / PLAN:  Acute on chronic hypoxemic respiratory failure s/p trach  Small pleural effusion - resolved as of CXR 10/6 Plan: ATC as tolerated, 28% Wean O2 for sats > 90%, recommend humidity via ATC only if on room air PMV as tolerated  SLP efforts appreciated  Await SNF placement  Given his aspiration risk and intermittently requiring suctioning, do not feel he is ready for decannulation    Trach care per protocol  Dysphagia - barrium swallow 9/21 with severe oral and mod pharyngeal dysphagia; mod to severe aspiration risk Plan: Diet per SLP     PCCM will follow for trach needs.  Will see once weekly unless new needs arise.  Stable with #4 Cuffless / ATC.   Kennieth Rad, AGACNP-BC [facility] Pgr: [phone] or if no answer [phone] 05/16/2017, 8:02 AM   STAFF NOTE: I, Merrie Roof, MD FACP have personally reviewed patient's available data, including medical history, events of note, physical examination and test results as part of my evaluation. I have discussed with resident/NP and other care providers such as pharmacist, RN and RRT. In addition, I personally evaluated patient and elicited key findings of: awake, alert on PMV, speaking well, lungs clear, he states that he coughs up secretions well, although his suctioing needs seem concerning at times, as well as poor rehab, when his suctioning needs reduce I think he is a cap candidate with goals of decannualtion, barring any plans for flap back in OR, if then I would keep trach longer till this is done , but that appears unclear when , continued pMV, cap likely soon if sucitoning needs  down  Lavon Paganini. Titus Mould, MD, [place] Pgr: [facility] 05/16/2017 3:26 PM

## 2017-05-17 LAB — BASIC METABOLIC PANEL
ANION GAP: 13 (ref 5–15)
BUN: 29 mg/dL — AB (ref 6–20)
CHLORIDE: 99 mmol/L — AB (ref 101–111)
CO2: 25 mmol/L (ref 22–32)
Calcium: 10.9 mg/dL — ABNORMAL HIGH (ref 8.9–10.3)
Creatinine, Ser: 0.75 mg/dL (ref 0.61–1.24)
GFR calc Af Amer: >60 mL/min (ref >60)
Glucose, Bld: 109 mg/dL — ABNORMAL HIGH (ref 65–99)
POTASSIUM: 4.4 mmol/L (ref 3.5–5.1)
SODIUM: 137 mmol/L (ref 135–145)

## 2017-05-17 LAB — CBC
HCT: 40.9 % (ref 39.0–52.0)
HEMOGLOBIN: 12.3 g/dL — AB (ref 13.0–17.0)
MCH: 27.9 pg (ref 26.0–34.0)
MCHC: 30.1 g/dL (ref 30.0–36.0)
MCV: 92.7 fL (ref 78.0–100.0)
PLATELETS: 245 10*3/uL (ref 150–400)
RBC: 4.41 MIL/uL (ref 4.22–5.81)
RDW: 16.1 % — ABNORMAL HIGH (ref 11.5–15.5)
WBC: 14.7 10*3/uL — AB (ref 4.0–10.5)

## 2017-05-17 LAB — GLUCOSE, CAPILLARY
GLUCOSE-CAPILLARY: 144 mg/dL — AB (ref 65–99)
GLUCOSE-CAPILLARY: 124 mg/dL — AB (ref 65–99)
Glucose-Capillary: 157 mg/dL — ABNORMAL HIGH (ref 65–99)
Glucose-Capillary: 122 mg/dL — ABNORMAL HIGH (ref 65–99)

## 2017-05-17 NOTE — Progress Notes (Signed)
RT entered the room and noticed the patients trach was dislodged but not out completely.  This nurse enetered the room to assess the patient.  O2 sats were stable and patient was not in distress.  Lurline Idol was placed back by RT and trach collar was tightened.  Will Continue to monitor.

## 2017-05-17 NOTE — Progress Notes (Signed)
  Speech Language Pathology Treatment: Dysphagia;Cognitive-Linquistic  Patient Details Name: Joe Wilkinson MRN: [id number] DOB: 04/20/1961 Today's Date: 05/17/2017 Time: 9628-3662 SLP Time Calculation (min) (ACUTE ONLY): 13 min  Assessment / Plan / Recommendation Clinical Impression  Pt demonstrates improved arousal and initiation, making requests and comments without cueing. Pt does not consume puree textures on tray. Pt was able to verbalize awareness of left hemiparesis, but denies awareness of any difficulty masticating even after SLP provided feedback of solids on left lips. Pt did still have evidence of decreased sensation on the left and resulting anterior spillage but did not have a significant amount of pocketed material on the left. Will upgrade diet to dys 2 with ongoing supervision to attempt a more palatable texture and monitor for tolerance.   HPI HPI: 56 yo male brought to [facility] with altered mental status from DKA and sepsis. Found to have RT MCA CVA and transfer to [facility]. Required Rt decompressive craniectomy and remained on vent post op.ETT 8/23-9/5, s/p tracheostomy 9/5.       SLP Plan  Continue with current plan of care       Recommendations  Diet recommendations: Dysphagia 2 (fine chop);Thin liquid Liquids provided via: Cup;Straw Medication Administration: Crushed with puree Supervision: Staff to assist with self feeding;Full supervision/cueing for compensatory strategies Compensations: Minimize environmental distractions;Follow solids with liquid;Monitor for anterior loss;Lingual sweep for clearance of pocketing;Small sips/bites                Plan: Continue with current plan of care       GO               [facility], MA CCC-SLP [phone]  Lynann Beaver 05/17/2017, 1:38 PM

## 2017-05-17 NOTE — Progress Notes (Signed)
RT changed pt from 28% ATC to 21% ATC. Currently, pt is tolerating RA with SpO2 around 93-94%. Goal is to keep O2 sats >90%. RN notified. RT will continue to monitor.

## 2017-05-17 NOTE — Progress Notes (Signed)
[facility]                                                                              Patient Demographics  Joe Wilkinson, is a 56 y.o. male, DOB - January 26, 1961, AYT:[id number]  Admit date - 03/31/2017   Admitting Physician Kerney Elbe, MD  Outpatient Primary MD for the patient is Patient, No Pcp Per  Outpatient specialists:   LOS - 46  days   Medical records reviewed and are as summarized below:    No chief complaint on file.      Brief summary  HPI on 04/01/2017 by Dr. 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 He was transferred to [facility] ICU for NeuroSx care, seen by neurosurgery and Neuro with plans for craniotomy. He was intubated and sedated and unable to provide any HPI, ROS, PMH, PSH, FH, home meds, allergies. Interim history  He was transferred to the ICU and underwent decompressive right craniectomy 8/25. Hypertonic saline protocol was completed. He required transfusion support and was treated for AFib with RVR, continued on the ventilator, eventually receiving trach 9/5 and PEG 9/6, on ATC 9/7. Patient does not have health insurance and because of his craniectomy without the ability to fit a helmet, most skilled nursing facilities areunwilling to accept him. Currently he is getting meds through PEG and continuous TF. Heparin was converted to prophylaxis Lovenox. He was transferred from [facility] service to [facility] service on 9/25. Currently awaiting placement to snf.   Assessment & Plan   Massive right MCA territory infarct, PCA watershed infarct- acute CVA - the patient was found unresponsive, hemiparesis and was found to have acute right MCA CVA - Neurosurgery, neurology consulted, post craniotomy, cytotoxic edema requiring decompression with craniotomy, hypersaline treatment - Continue trach, PEG for dysphagia  -  Neurosurgery follow-up in 2 months. - LDL 38, hemoglobin A1c 14.3 - Repeat CT head showed further swelling in the region of complete right MCA territory infarction with more extensive bulging of the brain through the craniectomy defect. No midline shift. At superior margin there could be some extension of the swollen brain through dural defect. At the superior margin, small amount of petechial bleeding within the region of infarction - Pending skilled nursing facility bed   Acute respiratory failure with hypoxia and hypercarbia - the patient was intubated at the time of admission, status post trach - Continue pulmonary hygiene, followed by PC CM, did not feel patient is ready for decannulation given his aspiration risk, suctioning requirements  Septic shock secondary to S agalactiae pneumonia - resolved, complete antibiotics on 8/30. However placed on Zosyn from 9/129/8 due to concerns for aspiration. - continues to have leukocytosis, afebrile   New-onset atrial fibrillation with RVR - currently normal sinus rhythm, [place] vasc 4 - patient was not placed on an decortication due to anemia requiring 4 units packed RBCs. Hemoglobin has remained stable, started on Eliquis by neurology  - follow CBC  Blood loss anemia- acute on chronic - unclear source,FOBT negative - Hemoglobin  has remained stable in the past several weeks   Escherichia coli, Klebsiella UTI - completed Keflex on 05/15/17 for 10 days  Nutrition, dysphagia - continue tube feeds.  Perianal cellulitis with possible abscess - Gen. Surgery was consulted,was placed on clindamycin   Code Status:full CODE STATUS DVT Prophylaxis:  eliquis  Family Communication: no family at the bedside  Disposition Plan: pending skilled nursing facility  Time Spent in minutes   25 minutes  Procedures:  -LP 8/24 >> glucose 205, RBC 16, protein 62, WBC 5 -CT head 8/24 >> Large Rt MCA -8/25 decompressive craniectomy -EEG 8/27 >> bilateral  cerebral dysfunction, non-specific in etiology -Echo 8/27 >> EF 65 to 70% -CT abd/pelvis 8/30 >> third spacing, 3.5 cm low attenuation Lt hepatic lobe -CT Head 9/3>> Severe right hemisphere cytotoxic edema with stable extension of edematous brain through the right craniectomy defect. Stable petechial hemorrhage without malignant hemorrhagicTransformation. Stable intracranial mass effect with leftward midline shift of 7 mm. Small left PCA territory infarct. -9/5 Trach placement.  -9/6 PEG tube placement.  -CXR 9/23 > LLL atx and small effusion -Barrium swallow 9/21 > severe oral and mod pharyngeal dysphagia; mod to severe aspiration risk -CT head on 10/1 shows Further swelling in the region of the complete right MCA territory infarction with more extensive bulging of the brain through the craniectomy defect. No midline shift.   Consultants:   PCCM Neurology Neurosurgery General surgery   Antimicrobials:      Medications  Scheduled Meds: . acetaminophen (TYLENOL) oral liquid 160 mg/5 mL  650 mg Per Tube TID  . apixaban  5 mg Oral BID  . chlorhexidine  15 mL Mouth Rinse BID  . feeding supplement (PRO-STAT SUGAR FREE 64)  30 mL Per Tube BID  . free water  250 mL Per Tube Q8H  . gabapentin  300 mg Oral QHS  . glycopyrrolate  1 mg Per Tube BID  . insulin aspart  0-20 Units Subcutaneous TID WC  . insulin aspart  0-5 Units Subcutaneous QHS  . insulin detemir  34 Units Subcutaneous Daily  . metoprolol tartrate  12.5 mg Per Tube BID  . pantoprazole sodium  40 mg Per Tube Daily   Continuous Infusions: . feeding supplement (GLUCERNA 1.2 CAL) 1,000 mL (05/16/17 1003)   PRN Meds:.bisacodyl, docusate, ibuprofen, ipratropium-albuterol, [DISCONTINUED] ondansetron **OR** ondansetron (ZOFRAN) IV, promethazine   Antibiotics   Anti-infectives    Start     Dose/Rate Route Frequency Ordered Stop   05/05/17 2200  cephALEXin (KEFLEX) capsule 500 mg     500 mg Oral Every 12 hours 05/05/17 1640  05/15/17 1042   04/26/17 0600  vancomycin (VANCOCIN) IVPB 750 mg/150 ml premix  Status:  Discontinued     750 mg 150 mL/hr over 60 Minutes Intravenous Every 12 hours 04/25/17 1729 04/25/17 1730   04/25/17 1830  clindamycin (CLEOCIN) capsule 300 mg     300 mg Oral Every 6 hours 04/25/17 1730 04/29/17 1717   04/25/17 1800  vancomycin (VANCOCIN) IVPB 750 mg/150 ml premix  Status:  Discontinued     750 mg 150 mL/hr over 60 Minutes Intravenous Every 12 hours 04/25/17 1728 04/25/17 1729   04/23/17 0945  piperacillin-tazobactam (ZOSYN) IVPB 3.375 g  Status:  Discontinued     3.375 g 12.5 mL/hr over 240 Minutes Intravenous Every 8 hours 04/23/17 0930 04/25/17 1730   04/23/17 0430  vancomycin (VANCOCIN) IVPB 1000 mg/200 mL premix  Status:  Discontinued     1,000 mg 200 mL/hr  over 60 Minutes Intravenous Every 12 hours 04/22/17 1655 04/25/17 1728   04/22/17 1630  vancomycin (VANCOCIN) IVPB 1000 mg/200 mL premix     1,000 mg 200 mL/hr over 60 Minutes Intravenous  Once 04/22/17 1627 04/22/17 1826   04/08/17 0200  piperacillin-tazobactam (ZOSYN) IVPB 3.375 g  Status:  Discontinued     3.375 g 12.5 mL/hr over 240 Minutes Intravenous Every 8 hours 04/08/17 0058 04/15/17 1601   04/08/17 0100  piperacillin-tazobactam (ZOSYN) IVPB 3.375 g  Status:  Discontinued     3.375 g 100 mL/hr over 30 Minutes Intravenous Every 6 hours 04/08/17 0054 04/08/17 0057   04/03/17 1200  ampicillin (OMNIPEN) 2 g in sodium chloride 0.9 % 50 mL IVPB     2 g 150 mL/hr over 20 Minutes Intravenous Every 6 hours 04/03/17 1005 04/06/17 1837   04/01/17 1800  vancomycin (VANCOCIN) IVPB 1000 mg/200 mL premix  Status:  Discontinued     1,000 mg 200 mL/hr over 60 Minutes Intravenous Every 24 hours 04/01/17 0146 04/01/17 0154   04/01/17 1600  ceFAZolin (ANCEF) IVPB 2g/100 mL premix     2 g 200 mL/hr over 30 Minutes Intravenous Every 8 hours 04/01/17 1100 04/02/17 0056   04/01/17 0828  bacitracin 50,000 Units in sodium chloride  irrigation 0.9 % 500 mL irrigation  Status:  Discontinued       As needed 04/01/17 0837 04/01/17 0947   04/01/17 0600  piperacillin-tazobactam (ZOSYN) IVPB 3.375 g  Status:  Discontinued     3.375 g 12.5 mL/hr over 240 Minutes Intravenous Every 8 hours 04/01/17 0146 04/01/17 0154   04/01/17 0600  vancomycin (VANCOCIN) IVPB 750 mg/150 ml premix  Status:  Discontinued     750 mg 150 mL/hr over 60 Minutes Intravenous Every 12 hours 04/01/17 0225 04/03/17 0959   04/01/17 0400  piperacillin-tazobactam (ZOSYN) IVPB 3.375 g  Status:  Discontinued     3.375 g 12.5 mL/hr over 240 Minutes Intravenous Every 8 hours 04/01/17 0225 04/03/17 0959        Subjective:   Bexley Mclester was seen and examined today.  No complaints, no fevers. Overnight no acute issues. Patient denies dizziness, chest pain, shortness of breath, abdominal pain, N/V/D/C, new weakness, numbess, tingling.   Objective:   Vitals:   05/17/17 0140 05/17/17 0547 05/17/17 0811 05/17/17 0958  BP: 116/69 100/81  109/78  Pulse: 92 85  87  Resp: 18 18  18   Temp: 98.6 F (37 C) 98.6 F (37 C)  (!) 97.5 F (36.4 C)  TempSrc: Oral Axillary  Axillary  SpO2: 94% 97% 94% 94%  Weight:      Height:        Intake/Output Summary (Last 24 hours) at 05/17/17 1118 Last data filed at 05/17/17 0932  Gross per 24 hour  Intake             1850 ml  Output             1000 ml  Net              850 ml     Wt Readings from Last 3 Encounters:  05/12/17 83.7 kg (184 lb 9.6 oz)  03/30/17 113.4 kg (250 lb)     Exam  General: Alert and oriented, NAD  Eyes: PERRLA, EOMI, Anicteric Sclera,right craniectomy  HEENT:  Trach+  Cardiovascular: S1 S2 auscultated, no rubs, murmurs or gallops. Regular rate and rhythm.  Respiratory: Clear to auscultation bilaterally, no wheezing, rales or rhonchi  Gastrointestinal: Soft, nontender, nondistended, + bowel sounds, PEG  Ext: no pedal edema bilaterally  Neuro: left-sided  hemiparesis  Musculoskeletal: No digital cyanosis, clubbing  Skin: No rashes  Psych: normal affect   Data Reviewed:  I have personally reviewed following labs and imaging studies  Micro Results No results found for this or any previous visit (from the past 240 hour(s)).  Radiology Reports Ct Head Wo Contrast  Result Date: 05/08/2017 CLINICAL DATA:  Followup craniectomy.  Headache. EXAM: CT HEAD WITHOUT CONTRAST TECHNIQUE: Contiguous axial images were obtained from the base of the skull through the vertex without intravenous contrast. COMPARISON:  04/10/2017 and multiple previous FINDINGS: Brain: Massive swelling in the region of the complete right MCA territory infarction with more protrusion of the infarcted brain tissue through the craniectomy defect. Mild petechial bleeding within the superior portion of the infarction but without frank hematoma. No new vascular territory involvement. No midline shift because of the decompressive craniectomy. No ventricular trapping. No extra-axial collection. Along the upper region of the craniectomy, I wonder if there could actually be a dural defect through which some of the swollen brain has extended. Vascular: No other vascular finding. Skull: No unexpected finding related to the craniectomy. Sinuses/Orbits: No significant sinus finding. Old blowout fracture on the right. Other: None significant IMPRESSION: Further swelling in the region of the complete right MCA territory infarction with more extensive bulging of the brain through the craniectomy defect. No midline shift however, because of the decompressive craniectomy. At the superior margin, there could be some extension of the swollen brain through a dural defect. Also at the superior margin, there is a small amount of petechial bleeding within the region of infarction. Electronically Signed   By: Nelson Chimes M.D.   On: 05/08/2017 14:12   Ct Abdomen Pelvis W Contrast  Result Date:  04/22/2017 CLINICAL DATA:  Perianal abscess. Status post stroke and complicated recovery. EXAM: CT ABDOMEN AND PELVIS WITH CONTRAST TECHNIQUE: Multidetector CT imaging of the abdomen and pelvis was performed using the standard protocol following bolus administration of intravenous contrast. CONTRAST:  184mL ISOVUE-300 IOPAMIDOL (ISOVUE-300) INJECTION 61% COMPARISON:  CT abdomen and pelvis April 06, 2017 FINDINGS: LOWER CHEST: Bilateral lower lobe atelectasis. Heart size is upper limits of normal. No pericardial effusion. Included heart size is normal. No pericardial effusion. HEPATOBILIARY: 2.5 cm heterogeneously enhancing mass LEFT lobe of the liver with centripetal puddling. Liver is otherwise unremarkable. Status post cholecystectomy. PANCREAS: Normal. SPLEEN: Normal. ADRENALS/URINARY TRACT: Kidneys are orthotopic, demonstrating symmetric enhancement. No nephrolithiasis, hydronephrosis or solid renal masses. Too small to characterize hypodensities in the kidneys bilaterally. The unopacified ureters are normal in course and caliber. Delayed imaging through the kidneys demonstrates symmetric prompt contrast excretion within the proximal urinary collecting system. Urinary bladder is partially distended and unremarkable. Normal adrenal glands. STOMACH/BOWEL: The stomach, small and large bowel are normal in course and caliber without inflammatory changes, sensitivity decreased without oral contrast. Intraluminal gastrostomy retaining bulb. Moderate colonic diverticulosis. Normal appendix. VASCULAR/LYMPHATIC: Aortoiliac vessels are normal in course and caliber. Mild calcific atherosclerosis. No lymphadenopathy by CT size criteria. Prominent inguinal lymph nodes are likely reactive. REPRODUCTIVE: Normal. OTHER: No intraperitoneal free fluid or free air. MUSCULOSKELETAL: Perianal soft tissue stranding and soft tissue density without focal fluid collection, subcutaneous gas or radiopaque foreign bodies inferior extent of  process incompletely imaged. No intraperitoneal extension. Small bilateral fat containing inguinal hernias. Small fat containing umbilical hernia. Severe LEFT L5-S1 facet arthropathy. IMPRESSION: 1. Perianal cellulitis without drainable fluid collection ;  inferior extent of process not imaged. 2. No acute intra-abdominal or pelvic process. 3. 2.5 cm hepatic hemangioma. Aortic Atherosclerosis (ICD10-I70.0). Electronically Signed   By: Elon Alas M.D.   On: 04/22/2017 23:37   Dg Chest Port 1 View  Result Date: 05/13/2017 CLINICAL DATA:  Leukocytosis. EXAM: PORTABLE CHEST 1 VIEW COMPARISON:  05/05/2017 FINDINGS: Tracheostomy remains in place. There is mild atelectasis at both lung bases. No consolidation or lobar collapse. Upper lungs are clear. No heart failure or effusion. IMPRESSION: Mild bibasilar atelectasis. Electronically Signed   By: Nelson Chimes M.D.   On: 05/13/2017 08:55   Dg Chest Port 1 View  Result Date: 05/05/2017 CLINICAL DATA:  Neck pain EXAM: PORTABLE CHEST 1 VIEW COMPARISON:  Chest radiograph 04/30/2017 FINDINGS: Tracheostomy tube tip is at the level of the clavicular heads. Cardiomediastinal contours are unchanged. No focal airspace consolidation or pulmonary edema. No pneumothorax. Unchanged probable left pleural effusion and associated atelectasis. IMPRESSION: Unchanged small left pleural effusion and associated atelectasis. Electronically Signed   By: Ulyses Jarred M.D.   On: 05/05/2017 20:58   Dg Chest Port 1 View  Result Date: 04/30/2017 CLINICAL DATA:  Respiratory failure EXAM: PORTABLE CHEST 1 VIEW COMPARISON:  04/24/2017 FINDINGS: Left lower lobe airspace disease has progressed in the interval. Small left effusion. Right lung clear. Tracheostomy remains in good position IMPRESSION: Progression of mild left lower lobe atelectasis/ infiltrate and small left effusion. Electronically Signed   By: Franchot Gallo M.D.   On: 04/30/2017 15:20   Dg Chest Port 1 View  Result  Date: 04/24/2017 CLINICAL DATA:  Replacement of tracheostomy tube today. EXAM: PORTABLE CHEST 1 VIEW COMPARISON:  Single-view of the chest 04/22/2017. FINDINGS: Tracheostomy tube is in place with the tip in good position at the level of the clavicular heads. Lungs are clear. Heart size is normal. No pneumothorax or pleural effusion. No acute abnormality. IMPRESSION: Tracheostomy tube in good position.  No acute disease. Electronically Signed   By: Inge Rise M.D.   On: 04/24/2017 12:20   Dg Chest Port 1 View  Result Date: 04/22/2017 CLINICAL DATA:  Followup respiratory failure. EXAM: PORTABLE CHEST 1 VIEW COMPARISON:  04/18/2017 FINDINGS: Tracheostomy remains in place. Upper lungs are clear. Mild volume loss in both lower lobes. No worsening or new findings. IMPRESSION: Persistent mild volume loss in both lower lobes. Electronically Signed   By: Nelson Chimes M.D.   On: 04/22/2017 08:25   Dg Chest Port 1 View  Result Date: 04/18/2017 CLINICAL DATA:  Tracheostomy patient, pulmonary edema, status post CVA. History of diabetes. EXAM: PORTABLE CHEST 1 VIEW COMPARISON:  Portable chest x-ray of April 15, 2017 FINDINGS: The lungs are hypoinflated. The interstitial markings remain mildly prominent greatest on the left. There is no alveolar infiltrate or significant pleural effusion. There is no pneumothorax. The tracheostomy tube tip projects at the level of the clavicular heads. The heart is normal in size. The central pulmonary vascularity is prominent. IMPRESSION: Slight improvement in the appearance of the pulmonary interstitium especially on the right. Persistent low-grade interstitial edema. No alveolar pneumonia. Electronically Signed   By: David  Martinique M.D.   On: 04/18/2017 07:34   Dg Abd 2 Views  Result Date: 05/06/2017 CLINICAL DATA:  Abdominal distension. EXAM: ABDOMEN - 2 VIEW COMPARISON:  May 02, 2017. FINDINGS: No abnormal bowel dilatation is noted. Gastrostomy tube is seen projected  over gastric air bubble. Residual contrast is seen in the colon. No abnormal calcifications are noted. IMPRESSION: No evidence of  bowel obstruction or ileus. Electronically Signed   By: Marijo Conception, M.D.   On: 05/06/2017 21:02   Dg Abd Portable 1v  Result Date: 05/02/2017 CLINICAL DATA:  Abdomen pain EXAM: PORTABLE ABDOMEN - 1 VIEW COMPARISON:  None. FINDINGS: The bowel gas pattern is normal. No radio-opaque calculi or other significant radiographic abnormality are seen. Surgical clips are projected over the right pelvis. IMPRESSION: No acute abnormality. Electronically Signed   By: Abelardo Diesel M.D.   On: 05/02/2017 15:33   Dg Swallowing Func-speech Pathology  Result Date: 05/05/2017 Objective Swallowing Evaluation: Type of Study: MBS-Modified Barium Swallow Study Patient Details Name: Reace Breshears MRN: [id number] Date of Birth: 1961/01/31 Today's Date: 05/05/2017 Time: SLP Start Time (ACUTE ONLY): 1350-SLP Stop Time (ACUTE ONLY): 1410 SLP Time Calculation (min) (ACUTE ONLY): 20 min Past Medical History: No past medical history on file. Past Surgical History: Past Surgical History: Procedure Laterality Date . CRANIECTOMY Right 04/01/2017  Procedure: RIGHT DECOMPRESSIVE CRANIECTOMY;  Surgeon: Ditty, Kevan Ny, MD;  Location: [place];  Service: Neurosurgery;  Laterality: Right; . ESOPHAGOGASTRODUODENOSCOPY N/A 04/13/2017  Procedure: ESOPHAGOGASTRODUODENOSCOPY (EGD);  Surgeon: Georganna Skeans, MD;  Location: [place];  Service: General;  Laterality: N/A;  bedside . PEG PLACEMENT N/A 04/13/2017  Procedure: PERCUTANEOUS ENDOSCOPIC GASTROSTOMY (PEG) PLACEMENT;  Surgeon: Georganna Skeans, MD;  Location: [facility] ENDOSCOPY;  Service: General;  Laterality: N/A; HPI: 56 yo male [facility] to [facility] with altered mental status from DKA and sepsis. Found to have RT MCA CVA and transfer to [facility]. Required Rt decompressive craniectomy and remained on vent post op.ETT 8/23-9/5, s/p tracheostomy 9/5.  Subjective: pt eager for POs  Assessment / Plan / Recommendation CHL IP CLINICAL IMPRESSIONS 05/05/2017 Clinical Impression  Pt demonstrates cognitive impairment impacting swallow function. Prolonged liangual rocking and pumping occurs with most boluses, particulalry purees, though a liquid wash is beneficial to facilitate oral transit. Swallow response is delayed reuslting in flash penetration events. Otherwise oropharyngeal mechanism is WNL. Pt may initaite a puree/thin diet though intake may be poor and assistance will be needed for attention with feeding and to avoid prolonged oral holding. PMSV to be in place with all PO. WIll f/u for tolerance.  SLP Visit Diagnosis Dysphagia, oropharyngeal phase (R13.12) Attention and concentration deficit following -- Frontal lobe and executive function deficit following -- Impact on safety and function --   CHL IP TREATMENT RECOMMENDATION 05/05/2017 Treatment Recommendations Therapy as outlined in treatment plan below   Prognosis 05/05/2017 Prognosis for Safe Diet Advancement Good Barriers to Reach Goals -- Barriers/Prognosis Comment -- CHL IP DIET RECOMMENDATION 05/05/2017 SLP Diet Recommendations Thin liquid;Dysphagia 1 (Puree) solids Liquid Administration via Cup;Straw Medication Administration Crushed with puree Compensations Minimize environmental distractions;Follow solids with liquid Postural Changes Seated upright at 90 degrees   CHL IP OTHER RECOMMENDATIONS 05/05/2017 Recommended Consults -- Oral Care Recommendations Other (Comment);Oral care BID Other Recommendations Have oral suction available;Place PMSV during PO intake   CHL IP FOLLOW UP RECOMMENDATIONS 05/05/2017 Follow up Recommendations Skilled Nursing facility   [facility] IP FREQUENCY AND DURATION 05/05/2017 Speech Therapy Frequency (ACUTE ONLY) min 2x/week Treatment Duration 2 weeks      CHL IP ORAL PHASE 05/05/2017 Oral Phase Impaired Oral - Pudding Teaspoon -- Oral - Pudding Cup -- Oral - Honey Teaspoon NT Oral - Honey Cup -- Oral - Nectar Teaspoon  -- Oral - Nectar Cup -- Oral - Nectar Straw NT Oral - Thin Teaspoon NT Oral - Thin Cup -- Oral - Thin Straw Holding of bolus;Lingual pumping Oral -  Puree Lingual pumping;Holding of bolus Oral - Mech Soft -- Oral - Regular Lingual pumping;Holding of bolus Oral - Multi-Consistency -- Oral - Pill -- Oral Phase - Comment --  CHL IP PHARYNGEAL PHASE 05/05/2017 Pharyngeal Phase Impaired Pharyngeal- Pudding Teaspoon -- Pharyngeal -- Pharyngeal- Pudding Cup -- Pharyngeal -- Pharyngeal- Honey Teaspoon NT Pharyngeal -- Pharyngeal- Honey Cup -- Pharyngeal -- Pharyngeal- Nectar Teaspoon -- Pharyngeal -- Pharyngeal- Nectar Cup -- Pharyngeal -- Pharyngeal- Nectar Straw NT Pharyngeal -- Pharyngeal- Thin Teaspoon NT Pharyngeal -- Pharyngeal- Thin Cup -- Pharyngeal -- Pharyngeal- Thin Straw Delayed swallow initiation-pyriform sinuses;Penetration/Aspiration during swallow Pharyngeal Material enters airway, remains ABOVE vocal cords then ejected out;Material does not enter airway Pharyngeal- Puree Delayed swallow initiation-pyriform sinuses Pharyngeal -- Pharyngeal- Mechanical Soft Delayed swallow initiation-pyriform sinuses Pharyngeal -- Pharyngeal- Regular -- Pharyngeal -- Pharyngeal- Multi-consistency -- Pharyngeal -- Pharyngeal- Pill -- Pharyngeal -- Pharyngeal Comment --  CHL IP CERVICAL ESOPHAGEAL PHASE 04/28/2017 Cervical Esophageal Phase WFL Pudding Teaspoon -- Pudding Cup -- Honey Teaspoon -- Honey Cup -- Nectar Teaspoon -- Nectar Cup -- Nectar Straw -- Thin Teaspoon -- Thin Cup -- Thin Straw -- Puree -- Mechanical Soft -- Regular -- Multi-consistency -- Pill -- Cervical Esophageal Comment -- No flowsheet data found. DeBlois, Katherene Ponto 05/05/2017, 3:25 PM              Dg Swallowing Func-speech Pathology  Result Date: 04/28/2017 Objective Swallowing Evaluation: Type of Study: MBS-Modified Barium Swallow Study Patient Details Name: Demontre Padin MRN: [id number] Date of Birth: 04/09/1961 Today's Date: 04/28/2017 Time: SLP  Start Time (ACUTE ONLY): 0939-SLP Stop Time (ACUTE ONLY): 1003 SLP Time Calculation (min) (ACUTE ONLY): 24 min Past Medical History: No past medical history on file. Past Surgical History: Past Surgical History: Procedure Laterality Date . CRANIECTOMY Right 04/01/2017  Procedure: RIGHT DECOMPRESSIVE CRANIECTOMY;  Surgeon: Ditty, Kevan Ny, MD;  Location: [place];  Service: Neurosurgery;  Laterality: Right; . ESOPHAGOGASTRODUODENOSCOPY N/A 04/13/2017  Procedure: ESOPHAGOGASTRODUODENOSCOPY (EGD);  Surgeon: Georganna Skeans, MD;  Location: [place];  Service: General;  Laterality: N/A;  bedside . 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 Required Rt decompressive craniectomy and remained on vent post op.ETT 8/23-9/5, s/p tracheostomy 9/5.  Subjective: pt eager for POs Assessment / Plan / Recommendation CHL IP CLINICAL IMPRESSIONS 04/28/2017 Clinical Impression Pt has a severe oral and moderate pharyngeal dysphagia. Significant oral holding and anteropr/posterior rocking is observed, especially with thicker textures. Most of the puree was orally suctioned after not being able to initiate a/p transfer given Max cues, significant extra time, and small amounts of honey thick liquids as a liquid wash. Small amounts had already fallen posteriorly to the pharynx without a swallow triggered. He moved thin and nectar thick liquids more readily into his pharynx, but a delay in swallow and incomplete glottal closure allowed for silent aspiration before/druing the swallow.  Pt will not cough to command because it makes his head hurt. Small spoonfuls of thin liquid, even after pooling in his pyriform sinuses, did not enter the airway. Recommend allowing a few ice  chips, given one at a time by staff, following oral care to provide pt comfort and increase use of swallowing musculature. Would not leave patient with a cup of ice as he would be  impulsive with his intake and has trouble consistently initiating a swallow. SLP will continue to follow. SLP Visit Diagnosis Dysphagia, oropharyngeal phase (R13.12) Attention and concentration deficit following -- Frontal lobe and executive function deficit following -- Impact on safety and function Moderate aspiration risk;Severe aspiration risk;Risk for inadequate nutrition/hydration   CHL IP TREATMENT RECOMMENDATION 04/28/2017 Treatment Recommendations Therapy as outlined in treatment plan below   Prognosis 04/28/2017 Prognosis for Safe Diet Advancement Good Barriers to Reach Goals Severity of deficits Barriers/Prognosis Comment -- CHL IP DIET RECOMMENDATION 04/28/2017 SLP Diet Recommendations NPO;Ice chips PRN after oral care Liquid Administration via -- Medication Administration Via alternative means Compensations -- Postural Changes --   CHL IP OTHER RECOMMENDATIONS 04/28/2017 Recommended Consults -- Oral Care Recommendations Oral care QID;Other (Comment) Other Recommendations Have oral suction available   CHL IP FOLLOW UP RECOMMENDATIONS 04/28/2017 Follow up Recommendations Skilled Nursing facility   [facility] IP FREQUENCY AND DURATION 04/28/2017 Speech Therapy Frequency (ACUTE ONLY) min 2x/week Treatment Duration 2 weeks      CHL IP ORAL PHASE 04/28/2017 Oral Phase Impaired Oral - Pudding Teaspoon -- Oral - Pudding Cup -- Oral - Honey Teaspoon Left anterior bolus loss;Weak lingual manipulation;Reduced posterior propulsion;Holding of bolus;Lingual/palatal residue;Delayed oral transit;Decreased bolus cohesion Oral - Honey Cup -- Oral - Nectar Teaspoon -- Oral - Nectar Cup -- Oral - Nectar Straw Left anterior bolus loss;Weak lingual manipulation;Reduced posterior propulsion;Holding of bolus;Lingual/palatal residue;Delayed oral transit;Decreased  bolus cohesion Oral - Thin Teaspoon Left anterior bolus loss;Weak lingual manipulation;Reduced posterior propulsion;Holding of bolus;Lingual/palatal residue;Delayed oral transit;Decreased bolus cohesion Oral - Thin Cup -- Oral - Thin Straw Left anterior bolus loss;Weak lingual manipulation;Reduced posterior propulsion;Holding of bolus;Lingual/palatal residue;Delayed oral transit;Decreased bolus cohesion Oral - Puree Left anterior bolus loss;Weak lingual manipulation;Reduced posterior propulsion;Holding of bolus;Lingual/palatal residue;Delayed oral transit;Decreased bolus cohesion Oral - Mech Soft -- Oral - Regular -- Oral - Multi-Consistency -- Oral - Pill -- Oral Phase - Comment --  CHL IP PHARYNGEAL PHASE 04/28/2017 Pharyngeal Phase Impaired Pharyngeal- Pudding Teaspoon -- Pharyngeal -- Pharyngeal- Pudding Cup -- Pharyngeal -- Pharyngeal- Honey Teaspoon Delayed swallow initiation-pyriform sinuses;Reduced airway/laryngeal closure Pharyngeal -- Pharyngeal- Honey Cup -- Pharyngeal -- Pharyngeal- Nectar Teaspoon -- Pharyngeal -- Pharyngeal- Nectar Cup -- Pharyngeal -- Pharyngeal- Nectar Straw Delayed swallow initiation-pyriform sinuses;Reduced airway/laryngeal closure;Penetration/Aspiration during swallow Pharyngeal Material enters airway, passes BELOW cords without attempt by patient to eject out (silent aspiration) Pharyngeal- Thin Teaspoon Delayed swallow initiation-pyriform sinuses;Reduced airway/laryngeal closure Pharyngeal -- Pharyngeal- Thin Cup -- Pharyngeal -- Pharyngeal- Thin Straw Delayed swallow initiation-pyriform sinuses;Reduced airway/laryngeal closure;Penetration/Aspiration before swallow Pharyngeal Material enters airway, passes BELOW cords without attempt by patient to eject out (silent aspiration) Pharyngeal- Puree Delayed swallow initiation-pyriform sinuses;Reduced airway/laryngeal closure Pharyngeal -- Pharyngeal- Mechanical Soft -- Pharyngeal -- Pharyngeal- Regular -- Pharyngeal -- Pharyngeal-  Multi-consistency -- Pharyngeal -- Pharyngeal- Pill -- Pharyngeal -- Pharyngeal Comment --  CHL IP CERVICAL ESOPHAGEAL PHASE 04/28/2017 Cervical Esophageal Phase WFL Pudding Teaspoon -- Pudding Cup -- Honey Teaspoon -- Honey Cup -- Nectar Teaspoon -- Nectar Cup -- Nectar Straw -- Thin Teaspoon -- Thin Cup -- Thin Straw -- Puree -- Mechanical Soft -- Regular -- Multi-consistency -- Pill -- Cervical Esophageal Comment -- No flowsheet data found. [name] 04/28/2017, 11:05 AM  [name], M.A. CCC-SLP [phone]              Lab Data:  CBC:  Recent Labs Lab 05/11/17 0258 05/12/17 0536 05/13/17 0524 05/15/17 0507 05/17/17 0320  WBC 13.0* 13.8* 14.7* 13.0* 14.7*  HGB 11.2* 12.3* 12.1*  11.9* 12.3*  HCT 38.2* 38.9* 40.3 40.0 40.9  MCV 92.9 91.5 92.4 93.5 92.7  PLT 203 204 251 226 109   Basic Metabolic Panel:  Recent Labs Lab 05/11/17 0258 05/15/17 0507 05/17/17 0320  NA 136 137 137  K 4.9 4.5 4.4  CL 101 102 99*  CO2 26 27 25   GLUCOSE 105* 142* 109*  BUN 28* 28* 29*  CREATININE 0.72 0.75 0.75  CALCIUM 10.5* 10.6* 10.9*   GFR: Estimated Creatinine Clearance: 106.5 mL/min (by C-G formula based on SCr of 0.75 mg/dL). Liver Function Tests: No results for input(s): AST, ALT, ALKPHOS, BILITOT, PROT, ALBUMIN in the last 168 hours. No results for input(s): LIPASE, AMYLASE in the last 168 hours. No results for input(s): AMMONIA in the last 168 hours. Coagulation Profile: No results for input(s): INR, PROTIME in the last 168 hours. Cardiac Enzymes: No results for input(s): CKTOTAL, CKMB, CKMBINDEX, TROPONINI in the last 168 hours. BNP (last 3 results) No results for input(s): PROBNP in the last 8760 hours. HbA1C: No results for input(s): HGBA1C in the last 72 hours. CBG:  Recent Labs Lab 05/16/17 0619 05/16/17 1125 05/16/17 1607 05/16/17 2127 05/17/17 0644  GLUCAP 135* 153* 101* 135* 144*   Lipid Profile: No results for input(s): CHOL, HDL, LDLCALC, TRIG,  CHOLHDL, LDLDIRECT in the last 72 hours. Thyroid Function Tests: No results for input(s): TSH, T4TOTAL, FREET4, T3FREE, THYROIDAB in the last 72 hours. Anemia Panel: No results for input(s): VITAMINB12, FOLATE, FERRITIN, TIBC, IRON, RETICCTPCT in the last 72 hours. Urine analysis:    Component Value Date/Time   COLORURINE YELLOW 05/02/2017 1353   APPEARANCEUR CLOUDY (A) 05/02/2017 1353   LABSPEC 1.017 05/02/2017 1353   PHURINE 7.0 05/02/2017 1353   GLUCOSEU NEGATIVE 05/02/2017 1353   HGBUR NEGATIVE 05/02/2017 1353   BILIRUBINUR NEGATIVE 05/02/2017 1353   KETONESUR NEGATIVE 05/02/2017 1353   PROTEINUR 100 (A) 05/02/2017 1353   NITRITE NEGATIVE 05/02/2017 1353   LEUKOCYTESUR NEGATIVE 05/02/2017 1353     Shawnell Dykes M.D. [facility] 05/17/2017, 11:18 AM  Pager: [phone] Between 7am to 7pm - call Pager - [phone]  After 7pm go to [email or web address] - password [email or web address]  Call night coverage person covering after 7pm

## 2017-05-18 LAB — GLUCOSE, CAPILLARY
GLUCOSE-CAPILLARY: 118 mg/dL — AB (ref 65–99)
GLUCOSE-CAPILLARY: 131 mg/dL — AB (ref 65–99)
GLUCOSE-CAPILLARY: 130 mg/dL — AB (ref 65–99)
GLUCOSE-CAPILLARY: 104 mg/dL — AB (ref 65–99)

## 2017-05-18 NOTE — Progress Notes (Signed)
CSW continuing to follow for SNF placement. Patient remains DTP, no beds available at this time.  Laveda Abbe,  Clinical Social Worker [phone]

## 2017-05-18 NOTE — Progress Notes (Signed)
[facility]                                                                              Patient Demographics  Joe Wilkinson, is a 56 y.o. male, DOB - 25-Oct-1960, KGY:[id number]  Admit date - 03/31/2017   Admitting Physician Kerney Elbe, MD  Outpatient Primary MD for the patient is Patient, No Pcp Per  Outpatient specialists:   LOS - 47  days   Medical records reviewed and are as summarized below:    No chief complaint on file.      Brief summary  HPI on 04/01/2017 by Dr. 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 He was transferred to [facility] ICU for NeuroSx care, seen by neurosurgery and Neuro with plans for craniotomy. He was intubated and sedated and unable to provide any HPI, ROS, PMH, PSH, FH, home meds, allergies. Interim history  He was transferred to the ICU and underwent decompressive right craniectomy 8/25. Hypertonic saline protocol was completed. He required transfusion support and was treated for AFib with RVR, continued on the ventilator, eventually receiving trach 9/5 and PEG 9/6, on ATC 9/7. Patient does not have health insurance and because of his craniectomy without the ability to fit a helmet, most skilled nursing facilities areunwilling to accept him. Currently he is getting meds through PEG and continuous TF. Heparin was converted to prophylaxis Lovenox. He was transferred from [facility] service to [facility] service on 9/25. Currently awaiting placement to snf.   Assessment & Plan   Massive right MCA territory infarct, PCA watershed infarct- acute CVA - the patient was found unresponsive, hemiparesis and was found to have acute right MCA CVA - Neurosurgery, neurology consulted, post craniotomy, cytotoxic edema requiring decompression with craniotomy, hypersaline treatment - Continue trach, PEG for dysphagia  -  Neurosurgery follow-up in 2 months. - LDL 38, hemoglobin A1c 14.3 - Repeat CT head showed further swelling in the region of complete right MCA territory infarction with more extensive bulging of the brain through the craniectomy defect. No midline shift. At superior margin there could be some extension of the swollen brain through dural defect. At the superior margin, small amount of petechial bleeding within the region of infarction - Pending skilled nursing facility bed   Acute respiratory failure with hypoxia and hypercarbia - the patient was intubated at the time of admission, status post trach - Continue pulmonary hygiene, followed by PCCM - per PC CM on 10/9, #4 cuffless Shiley change to another #4 cuffless   Septic shock secondary to S agalactiae pneumonia - resolved, complete antibiotics on 8/30. However placed on Zosyn from 9/129/8 due to concerns for aspiration. - continues to have leukocytosis, afebrile, Will check CBC in a.m.   New-onset atrial fibrillation with RVR - currently normal sinus rhythm, [place] vasc 4 - patient was not placed on an decortication due to anemia requiring 4 units packed RBCs. Hemoglobin has remained stable, started on Eliquis by neurology  - hemoglobin stable, follow CBC  Blood loss anemia- acute on  chronic - unclear source,FOBT negative - Hemoglobin has remained stable in the past several weeks   Escherichia coli, Klebsiella UTI - completed Keflex on 05/15/17 for 10 days  Nutrition, dysphagia - continue tube feeds.  Perianal cellulitis with possible abscess - Gen. Surgery was consulted,was placed on clindamycin   Code Status:full CODE STATUS DVT Prophylaxis:  eliquis  Family Communication: no family at the bedside  Disposition Plan: pending skilled nursing facility  Time Spent in minutes   25 minutes  Procedures:  -LP 8/24 >> glucose 205, RBC 16, protein 62, WBC 5 -CT head 8/24 >> Large Rt MCA -8/25 decompressive craniectomy -EEG  8/27 >> bilateral cerebral dysfunction, non-specific in etiology -Echo 8/27 >> EF 65 to 70% -CT abd/pelvis 8/30 >> third spacing, 3.5 cm low attenuation Lt hepatic lobe -CT Head 9/3>> Severe right hemisphere cytotoxic edema with stable extension of edematous brain through the right craniectomy defect. Stable petechial hemorrhage without malignant hemorrhagicTransformation. Stable intracranial mass effect with leftward midline shift of 7 mm. Small left PCA territory infarct. -9/5 Trach placement.  -9/6 PEG tube placement.  -CXR 9/23 > LLL atx and small effusion -Barrium swallow 9/21 > severe oral and mod pharyngeal dysphagia; mod to severe aspiration risk -CT head on 10/1 shows Further swelling in the region of the complete right MCA territory infarction with more extensive bulging of the brain through the craniectomy defect. No midline shift.   Consultants:   PCCM Neurology Neurosurgery General surgery   Antimicrobials:      Medications  Scheduled Meds: . acetaminophen (TYLENOL) oral liquid 160 mg/5 mL  650 mg Per Tube TID  . apixaban  5 mg Oral BID  . chlorhexidine  15 mL Mouth Rinse BID  . feeding supplement (PRO-STAT SUGAR FREE 64)  30 mL Per Tube BID  . free water  250 mL Per Tube Q8H  . gabapentin  300 mg Oral QHS  . glycopyrrolate  1 mg Per Tube BID  . insulin aspart  0-20 Units Subcutaneous TID WC  . insulin aspart  0-5 Units Subcutaneous QHS  . insulin detemir  34 Units Subcutaneous Daily  . metoprolol tartrate  12.5 mg Per Tube BID  . pantoprazole sodium  40 mg Per Tube Daily   Continuous Infusions: . feeding supplement (GLUCERNA 1.2 CAL) 1,000 mL (05/16/17 1003)   PRN Meds:.bisacodyl, docusate, ibuprofen, ipratropium-albuterol, [DISCONTINUED] ondansetron **OR** ondansetron (ZOFRAN) IV, promethazine   Antibiotics   Anti-infectives    Start     Dose/Rate Route Frequency Ordered Stop   05/05/17 2200  cephALEXin (KEFLEX) capsule 500 mg     500 mg Oral Every 12  hours 05/05/17 1640 05/15/17 1042   04/26/17 0600  vancomycin (VANCOCIN) IVPB 750 mg/150 ml premix  Status:  Discontinued     750 mg 150 mL/hr over 60 Minutes Intravenous Every 12 hours 04/25/17 1729 04/25/17 1730   04/25/17 1830  clindamycin (CLEOCIN) capsule 300 mg     300 mg Oral Every 6 hours 04/25/17 1730 04/29/17 1717   04/25/17 1800  vancomycin (VANCOCIN) IVPB 750 mg/150 ml premix  Status:  Discontinued     750 mg 150 mL/hr over 60 Minutes Intravenous Every 12 hours 04/25/17 1728 04/25/17 1729   04/23/17 0945  piperacillin-tazobactam (ZOSYN) IVPB 3.375 g  Status:  Discontinued     3.375 g 12.5 mL/hr over 240 Minutes Intravenous Every 8 hours 04/23/17 0930 04/25/17 1730   04/23/17 0430  vancomycin (VANCOCIN) IVPB 1000 mg/200 mL premix  Status:  Discontinued  1,000 mg 200 mL/hr over 60 Minutes Intravenous Every 12 hours 04/22/17 1655 04/25/17 1728   04/22/17 1630  vancomycin (VANCOCIN) IVPB 1000 mg/200 mL premix     1,000 mg 200 mL/hr over 60 Minutes Intravenous  Once 04/22/17 1627 04/22/17 1826   04/08/17 0200  piperacillin-tazobactam (ZOSYN) IVPB 3.375 g  Status:  Discontinued     3.375 g 12.5 mL/hr over 240 Minutes Intravenous Every 8 hours 04/08/17 0058 04/15/17 1601   04/08/17 0100  piperacillin-tazobactam (ZOSYN) IVPB 3.375 g  Status:  Discontinued     3.375 g 100 mL/hr over 30 Minutes Intravenous Every 6 hours 04/08/17 0054 04/08/17 0057   04/03/17 1200  ampicillin (OMNIPEN) 2 g in sodium chloride 0.9 % 50 mL IVPB     2 g 150 mL/hr over 20 Minutes Intravenous Every 6 hours 04/03/17 1005 04/06/17 1837   04/01/17 1800  vancomycin (VANCOCIN) IVPB 1000 mg/200 mL premix  Status:  Discontinued     1,000 mg 200 mL/hr over 60 Minutes Intravenous Every 24 hours 04/01/17 0146 04/01/17 0154   04/01/17 1600  ceFAZolin (ANCEF) IVPB 2g/100 mL premix     2 g 200 mL/hr over 30 Minutes Intravenous Every 8 hours 04/01/17 1100 04/02/17 0056   04/01/17 0828  bacitracin 50,000 Units in  sodium chloride irrigation 0.9 % 500 mL irrigation  Status:  Discontinued       As needed 04/01/17 0837 04/01/17 0947   04/01/17 0600  piperacillin-tazobactam (ZOSYN) IVPB 3.375 g  Status:  Discontinued     3.375 g 12.5 mL/hr over 240 Minutes Intravenous Every 8 hours 04/01/17 0146 04/01/17 0154   04/01/17 0600  vancomycin (VANCOCIN) IVPB 750 mg/150 ml premix  Status:  Discontinued     750 mg 150 mL/hr over 60 Minutes Intravenous Every 12 hours 04/01/17 0225 04/03/17 0959   04/01/17 0400  piperacillin-tazobactam (ZOSYN) IVPB 3.375 g  Status:  Discontinued     3.375 g 12.5 mL/hr over 240 Minutes Intravenous Every 8 hours 04/01/17 0225 04/03/17 0959        Subjective:   Joe Wilkinson was seen and examined today. No complaints per patient. No fevers. Patient denies dizziness, chest pain, shortness of breath, abdominal pain, N/V/D/C, new weakness, numbess, tingling.   Objective:   Vitals:   05/18/17 0411 05/18/17 0840 05/18/17 0945 05/18/17 1003  BP: 108/68  129/83 127/75  Pulse: 87 92 (!) 102   Resp: 18 18 18 18   Temp: 98.4 F (36.9 C)  98.7 F (37.1 C) 98.3 F (36.8 C)  TempSrc: Axillary  Oral Oral  SpO2: 94% 96% 95% 97%  Weight:      Height:        Intake/Output Summary (Last 24 hours) at 05/18/17 1145 Last data filed at 05/18/17 0933  Gross per 24 hour  Intake              200 ml  Output             2750 ml  Net            -2550 ml     Wt Readings from Last 3 Encounters:  05/12/17 83.7 kg (184 lb 9.6 oz)  03/30/17 113.4 kg (250 lb)     Exam   General: Alert and oriented x 3, NAD  Eyes: right craniectomy  HEENT:  Trach   Cardiovascular: S1 S2 clear, RRR  Respiratory: Clear to auscultation bilaterally, no wheezing, rales or rhonchi  Gastrointestinal: Soft, nontender, nondistended, + bowel  sounds, PEG  Ext: no pedal edema bilaterally  Neuro: left-sided hemiparesis  Musculoskeletal: No digital cyanosis, clubbing  Skin: No rashes  Psych: Normal  affect and demeanor, alert and oriented x3    Data Reviewed:  I have personally reviewed following labs and imaging studies  Micro Results No results found for this or any previous visit (from the past 240 hour(s)).  Radiology Reports Ct Head Wo Contrast  Result Date: 05/08/2017 CLINICAL DATA:  Followup craniectomy.  Headache. EXAM: CT HEAD WITHOUT CONTRAST TECHNIQUE: Contiguous axial images were obtained from the base of the skull through the vertex without intravenous contrast. COMPARISON:  04/10/2017 and multiple previous FINDINGS: Brain: Massive swelling in the region of the complete right MCA territory infarction with more protrusion of the infarcted brain tissue through the craniectomy defect. Mild petechial bleeding within the superior portion of the infarction but without frank hematoma. No new vascular territory involvement. No midline shift because of the decompressive craniectomy. No ventricular trapping. No extra-axial collection. Along the upper region of the craniectomy, I wonder if there could actually be a dural defect through which some of the swollen brain has extended. Vascular: No other vascular finding. Skull: No unexpected finding related to the craniectomy. Sinuses/Orbits: No significant sinus finding. Old blowout fracture on the right. Other: None significant IMPRESSION: Further swelling in the region of the complete right MCA territory infarction with more extensive bulging of the brain through the craniectomy defect. No midline shift however, because of the decompressive craniectomy. At the superior margin, there could be some extension of the swollen brain through a dural defect. Also at the superior margin, there is a small amount of petechial bleeding within the region of infarction. Electronically Signed   By: Nelson Chimes M.D.   On: 05/08/2017 14:12   Ct Abdomen Pelvis W Contrast  Result Date: 04/22/2017 CLINICAL DATA:  Perianal abscess. Status post stroke and  complicated recovery. EXAM: CT ABDOMEN AND PELVIS WITH CONTRAST TECHNIQUE: Multidetector CT imaging of the abdomen and pelvis was performed using the standard protocol following bolus administration of intravenous contrast. CONTRAST:  160mL ISOVUE-300 IOPAMIDOL (ISOVUE-300) INJECTION 61% COMPARISON:  CT abdomen and pelvis April 06, 2017 FINDINGS: LOWER CHEST: Bilateral lower lobe atelectasis. Heart size is upper limits of normal. No pericardial effusion. Included heart size is normal. No pericardial effusion. HEPATOBILIARY: 2.5 cm heterogeneously enhancing mass LEFT lobe of the liver with centripetal puddling. Liver is otherwise unremarkable. Status post cholecystectomy. PANCREAS: Normal. SPLEEN: Normal. ADRENALS/URINARY TRACT: Kidneys are orthotopic, demonstrating symmetric enhancement. No nephrolithiasis, hydronephrosis or solid renal masses. Too small to characterize hypodensities in the kidneys bilaterally. The unopacified ureters are normal in course and caliber. Delayed imaging through the kidneys demonstrates symmetric prompt contrast excretion within the proximal urinary collecting system. Urinary bladder is partially distended and unremarkable. Normal adrenal glands. STOMACH/BOWEL: The stomach, small and large bowel are normal in course and caliber without inflammatory changes, sensitivity decreased without oral contrast. Intraluminal gastrostomy retaining bulb. Moderate colonic diverticulosis. Normal appendix. VASCULAR/LYMPHATIC: Aortoiliac vessels are normal in course and caliber. Mild calcific atherosclerosis. No lymphadenopathy by CT size criteria. Prominent inguinal lymph nodes are likely reactive. REPRODUCTIVE: Normal. OTHER: No intraperitoneal free fluid or free air. MUSCULOSKELETAL: Perianal soft tissue stranding and soft tissue density without focal fluid collection, subcutaneous gas or radiopaque foreign bodies inferior extent of process incompletely imaged. No intraperitoneal extension. Small  bilateral fat containing inguinal hernias. Small fat containing umbilical hernia. Severe LEFT L5-S1 facet arthropathy. IMPRESSION: 1. Perianal cellulitis without drainable fluid  collection ; inferior extent of process not imaged. 2. No acute intra-abdominal or pelvic process. 3. 2.5 cm hepatic hemangioma. Aortic Atherosclerosis (ICD10-I70.0). Electronically Signed   By: Elon Alas M.D.   On: 04/22/2017 23:37   Dg Chest Port 1 View  Result Date: 05/13/2017 CLINICAL DATA:  Leukocytosis. EXAM: PORTABLE CHEST 1 VIEW COMPARISON:  05/05/2017 FINDINGS: Tracheostomy remains in place. There is mild atelectasis at both lung bases. No consolidation or lobar collapse. Upper lungs are clear. No heart failure or effusion. IMPRESSION: Mild bibasilar atelectasis. Electronically Signed   By: Nelson Chimes M.D.   On: 05/13/2017 08:55   Dg Chest Port 1 View  Result Date: 05/05/2017 CLINICAL DATA:  Neck pain EXAM: PORTABLE CHEST 1 VIEW COMPARISON:  Chest radiograph 04/30/2017 FINDINGS: Tracheostomy tube tip is at the level of the clavicular heads. Cardiomediastinal contours are unchanged. No focal airspace consolidation or pulmonary edema. No pneumothorax. Unchanged probable left pleural effusion and associated atelectasis. IMPRESSION: Unchanged small left pleural effusion and associated atelectasis. Electronically Signed   By: Ulyses Jarred M.D.   On: 05/05/2017 20:58   Dg Chest Port 1 View  Result Date: 04/30/2017 CLINICAL DATA:  Respiratory failure EXAM: PORTABLE CHEST 1 VIEW COMPARISON:  04/24/2017 FINDINGS: Left lower lobe airspace disease has progressed in the interval. Small left effusion. Right lung clear. Tracheostomy remains in good position IMPRESSION: Progression of mild left lower lobe atelectasis/ infiltrate and small left effusion. Electronically Signed   By: Franchot Gallo M.D.   On: 04/30/2017 15:20   Dg Chest Port 1 View  Result Date: 04/24/2017 CLINICAL DATA:  Replacement of tracheostomy tube  today. EXAM: PORTABLE CHEST 1 VIEW COMPARISON:  Single-view of the chest 04/22/2017. FINDINGS: Tracheostomy tube is in place with the tip in good position at the level of the clavicular heads. Lungs are clear. Heart size is normal. No pneumothorax or pleural effusion. No acute abnormality. IMPRESSION: Tracheostomy tube in good position.  No acute disease. Electronically Signed   By: Inge Rise M.D.   On: 04/24/2017 12:20   Dg Chest Port 1 View  Result Date: 04/22/2017 CLINICAL DATA:  Followup respiratory failure. EXAM: PORTABLE CHEST 1 VIEW COMPARISON:  04/18/2017 FINDINGS: Tracheostomy remains in place. Upper lungs are clear. Mild volume loss in both lower lobes. No worsening or new findings. IMPRESSION: Persistent mild volume loss in both lower lobes. Electronically Signed   By: Nelson Chimes M.D.   On: 04/22/2017 08:25   Dg Abd 2 Views  Result Date: 05/06/2017 CLINICAL DATA:  Abdominal distension. EXAM: ABDOMEN - 2 VIEW COMPARISON:  May 02, 2017. FINDINGS: No abnormal bowel dilatation is noted. Gastrostomy tube is seen projected over gastric air bubble. Residual contrast is seen in the colon. No abnormal calcifications are noted. IMPRESSION: No evidence of bowel obstruction or ileus. Electronically Signed   By: Marijo Conception, M.D.   On: 05/06/2017 21:02   Dg Abd Portable 1v  Result Date: 05/02/2017 CLINICAL DATA:  Abdomen pain EXAM: PORTABLE ABDOMEN - 1 VIEW COMPARISON:  None. FINDINGS: The bowel gas pattern is normal. No radio-opaque calculi or other significant radiographic abnormality are seen. Surgical clips are projected over the right pelvis. IMPRESSION: No acute abnormality. Electronically Signed   By: Abelardo Diesel M.D.   On: 05/02/2017 15:33   Dg Swallowing Func-speech Pathology  Result Date: 05/05/2017 Objective Swallowing Evaluation: Type of Study: MBS-Modified Barium Swallow Study Patient Details Name: Joe Wilkinson MRN: [id number] Date of Birth: September 30, 1960 Today's Date:  05/05/2017 Time: SLP Start  Time (ACUTE ONLY): 1350-SLP Stop Time (ACUTE ONLY): 1410 SLP Time Calculation (min) (ACUTE ONLY): 20 min Past Medical History: No past medical history on file. Past Surgical History: Past Surgical History: Procedure Laterality Date . CRANIECTOMY Right 04/01/2017  Procedure: RIGHT DECOMPRESSIVE CRANIECTOMY;  Surgeon: Ditty, Kevan Ny, MD;  Location: [place];  Service: Neurosurgery;  Laterality: Right; . ESOPHAGOGASTRODUODENOSCOPY N/A 04/13/2017  Procedure: ESOPHAGOGASTRODUODENOSCOPY (EGD);  Surgeon: Georganna Skeans, MD;  Location: [place];  Service: General;  Laterality: N/A;  bedside . PEG PLACEMENT N/A 04/13/2017  Procedure: PERCUTANEOUS ENDOSCOPIC GASTROSTOMY (PEG) PLACEMENT;  Surgeon: Georganna Skeans, MD;  Location: [facility] ENDOSCOPY;  Service: General;  Laterality: N/A; HPI: 56 yo male brought to [facility] with altered mental status from DKA and sepsis. Found to have RT MCA CVA and transfer to [facility]. Required Rt decompressive craniectomy and remained on vent post op.ETT 8/23-9/5, s/p tracheostomy 9/5.  Subjective: pt eager for POs Assessment / Plan / Recommendation CHL IP CLINICAL IMPRESSIONS 05/05/2017 Clinical Impression  Pt demonstrates cognitive impairment impacting swallow function. Prolonged liangual rocking and pumping occurs with most boluses, particulalry purees, though a liquid wash is beneficial to facilitate oral transit. Swallow response is delayed reuslting in flash penetration events. Otherwise oropharyngeal mechanism is WNL. Pt may initaite a puree/thin diet though intake may be poor and assistance will be needed for attention with feeding and to avoid prolonged oral holding. PMSV to be in place with all PO. WIll f/u for tolerance.  SLP Visit Diagnosis Dysphagia, oropharyngeal phase (R13.12) Attention and concentration deficit following -- Frontal lobe and executive function deficit following -- Impact on safety and function --   CHL IP TREATMENT RECOMMENDATION 05/05/2017  Treatment Recommendations Therapy as outlined in treatment plan below   Prognosis 05/05/2017 Prognosis for Safe Diet Advancement Good Barriers to Reach Goals -- Barriers/Prognosis Comment -- CHL IP DIET RECOMMENDATION 05/05/2017 SLP Diet Recommendations Thin liquid;Dysphagia 1 (Puree) solids Liquid Administration via Cup;Straw Medication Administration Crushed with puree Compensations Minimize environmental distractions;Follow solids with liquid Postural Changes Seated upright at 90 degrees   CHL IP OTHER RECOMMENDATIONS 05/05/2017 Recommended Consults -- Oral Care Recommendations Other (Comment);Oral care BID Other Recommendations Have oral suction available;Place PMSV during PO intake   CHL IP FOLLOW UP RECOMMENDATIONS 05/05/2017 Follow up Recommendations Skilled Nursing facility   [facility] IP FREQUENCY AND DURATION 05/05/2017 Speech Therapy Frequency (ACUTE ONLY) min 2x/week Treatment Duration 2 weeks      CHL IP ORAL PHASE 05/05/2017 Oral Phase Impaired Oral - Pudding Teaspoon -- Oral - Pudding Cup -- Oral - Honey Teaspoon NT Oral - Honey Cup -- Oral - Nectar Teaspoon -- Oral - Nectar Cup -- Oral - Nectar Straw NT Oral - Thin Teaspoon NT Oral - Thin Cup -- Oral - Thin Straw Holding of bolus;Lingual pumping Oral - Puree Lingual pumping;Holding of bolus Oral - Mech Soft -- Oral - Regular Lingual pumping;Holding of bolus Oral - Multi-Consistency -- Oral - Pill -- Oral Phase - Comment --  CHL IP PHARYNGEAL PHASE 05/05/2017 Pharyngeal Phase Impaired Pharyngeal- Pudding Teaspoon -- Pharyngeal -- Pharyngeal- Pudding Cup -- Pharyngeal -- Pharyngeal- Honey Teaspoon NT Pharyngeal -- Pharyngeal- Honey Cup -- Pharyngeal -- Pharyngeal- Nectar Teaspoon -- Pharyngeal -- Pharyngeal- Nectar Cup -- Pharyngeal -- Pharyngeal- Nectar Straw NT Pharyngeal -- Pharyngeal- Thin Teaspoon NT Pharyngeal -- Pharyngeal- Thin Cup -- Pharyngeal -- Pharyngeal- Thin Straw Delayed swallow initiation-pyriform sinuses;Penetration/Aspiration during swallow  Pharyngeal Material enters airway, remains ABOVE vocal cords then ejected out;Material does not enter airway Pharyngeal- Puree Delayed swallow initiation-pyriform  sinuses Pharyngeal -- Pharyngeal- Mechanical Soft Delayed swallow initiation-pyriform sinuses Pharyngeal -- Pharyngeal- Regular -- Pharyngeal -- Pharyngeal- Multi-consistency -- Pharyngeal -- Pharyngeal- Pill -- Pharyngeal -- Pharyngeal Comment --  CHL IP CERVICAL ESOPHAGEAL PHASE 04/28/2017 Cervical Esophageal Phase WFL Pudding Teaspoon -- Pudding Cup -- Honey Teaspoon -- Honey Cup -- Nectar Teaspoon -- Nectar Cup -- Nectar Straw -- Thin Teaspoon -- Thin Cup -- Thin Straw -- Puree -- Mechanical Soft -- Regular -- Multi-consistency -- Pill -- Cervical Esophageal Comment -- No flowsheet data found. DeBlois, Katherene Ponto 05/05/2017, 3:25 PM              Dg Swallowing Func-speech Pathology  Result Date: 04/28/2017 Objective Swallowing Evaluation: Type of Study: MBS-Modified Barium Swallow Study Patient Details Name: Joe Wilkinson MRN: [id number] Date of Birth: March 06, 1961 Today's Date: 04/28/2017 Time: SLP Start Time (ACUTE ONLY): 0939-SLP Stop Time (ACUTE ONLY): 1003 SLP Time Calculation (min) (ACUTE ONLY): 24 min Past Medical History: No past medical history on file. Past Surgical History: Past Surgical History: Procedure Laterality Date . CRANIECTOMY Right 04/01/2017  Procedure: RIGHT DECOMPRESSIVE CRANIECTOMY;  Surgeon: Ditty, Kevan Ny, MD;  Location: [place];  Service: Neurosurgery;  Laterality: Right; . ESOPHAGOGASTRODUODENOSCOPY N/A 04/13/2017  Procedure: ESOPHAGOGASTRODUODENOSCOPY (EGD);  Surgeon: Georganna Skeans, MD;  Location: [place];  Service: General;  Laterality: N/A;  bedside . PEG PLACEMENT N/A 04/13/2017  Procedure: PERCUTANEOUS ENDOSCOPIC GASTROSTOMY (PEG) PLACEMENT;  Surgeon: Georganna Skeans, MD;  Location: [facility] ENDOSCOPY;  Service: General;  Laterality: N/A; HPI: 56 yo male brought to [facility] with altered mental status from DKA and sepsis.  Found to have RT MCA CVA and transfer to [facility]. Required Rt decompressive craniectomy and remained on vent post op.ETT 8/23-9/5, s/p tracheostomy 9/5.  Subjective: pt eager for POs Assessment / Plan / Recommendation CHL IP CLINICAL IMPRESSIONS 04/28/2017 Clinical Impression Pt has a severe oral and moderate pharyngeal dysphagia. Significant oral holding and anteropr/posterior rocking is observed, especially with thicker textures. Most of the puree was orally suctioned after not being able to initiate a/p transfer given Max cues, significant extra time, and small amounts of honey thick liquids as a liquid wash. Small amounts had already fallen posteriorly to the pharynx without a swallow triggered. He moved thin and nectar thick liquids more readily into his pharynx, but a delay in swallow and incomplete glottal closure allowed for silent aspiration before/druing the swallow.  Pt will not cough to command because it makes his head hurt. Small spoonfuls of thin liquid, even after pooling in his pyriform sinuses, did not enter the airway. Recommend allowing a few ice chips, given one at a time by staff, following oral care to provide pt comfort and increase use of swallowing musculature. Would not leave patient with a cup of ice as he would be impulsive with his intake and has trouble consistently initiating a swallow. SLP will continue to follow. SLP Visit Diagnosis Dysphagia, oropharyngeal phase (R13.12) Attention and concentration deficit following -- Frontal lobe and executive function deficit following -- Impact on safety and function Moderate aspiration risk;Severe aspiration risk;Risk for inadequate nutrition/hydration   CHL IP TREATMENT RECOMMENDATION 04/28/2017 Treatment Recommendations Therapy as outlined in treatment plan below   Prognosis 04/28/2017 Prognosis for Safe Diet Advancement Good Barriers to Reach Goals Severity of deficits Barriers/Prognosis Comment -- [facility] IP DIET RECOMMENDATION 04/28/2017 SLP Diet  Recommendations NPO;Ice chips PRN after oral care Liquid Administration via -- Medication Administration Via alternative means Compensations -- Postural Changes --   CHL IP OTHER RECOMMENDATIONS 04/28/2017 Recommended  Consults -- Oral Care Recommendations Oral care QID;Other (Comment) Other Recommendations Have oral suction available   [facility] IP FOLLOW UP RECOMMENDATIONS 04/28/2017 Follow up Recommendations Skilled Nursing facility   [facility] IP FREQUENCY AND DURATION 04/28/2017 Speech Therapy Frequency (ACUTE ONLY) min 2x/week Treatment Duration 2 weeks      [facility]L IP ORAL PHASE 04/28/2017 Oral Phase Impaired Oral - Pudding Teaspoon -- Oral - Pudding Cup -- Oral - Honey Teaspoon Left anterior bolus loss;Weak lingual manipulation;Reduced posterior propulsion;Holding of bolus;Lingual/palatal residue;Delayed oral transit;Decreased bolus cohesion Oral - Honey Cup -- Oral - Nectar Teaspoon -- Oral - Nectar Cup -- Oral - Nectar Straw Left anterior bolus loss;Weak lingual manipulation;Reduced posterior propulsion;Holding of bolus;Lingual/palatal residue;Delayed oral transit;Decreased bolus cohesion Oral - Thin Teaspoon Left anterior bolus loss;Weak lingual manipulation;Reduced posterior propulsion;Holding of bolus;Lingual/palatal residue;Delayed oral transit;Decreased bolus cohesion Oral - Thin Cup -- Oral - Thin Straw Left anterior bolus loss;Weak lingual manipulation;Reduced posterior propulsion;Holding of bolus;Lingual/palatal residue;Delayed oral transit;Decreased bolus cohesion Oral - Puree Left anterior bolus loss;Weak lingual manipulation;Reduced posterior propulsion;Holding of bolus;Lingual/palatal residue;Delayed oral transit;Decreased bolus cohesion Oral - Mech Soft -- Oral - Regular -- Oral - Multi-Consistency -- Oral - Pill -- Oral Phase - Comment --  [facility]L IP PHARYNGEAL PHASE 04/28/2017 Pharyngeal Phase Impaired Pharyngeal- Pudding Teaspoon -- Pharyngeal -- Pharyngeal- Pudding Cup -- Pharyngeal -- Pharyngeal- Honey Teaspoon  Delayed swallow initiation-pyriform sinuses;Reduced airway/laryngeal closure Pharyngeal -- Pharyngeal- Honey Cup -- Pharyngeal -- Pharyngeal- Nectar Teaspoon -- Pharyngeal -- Pharyngeal- Nectar Cup -- Pharyngeal -- Pharyngeal- Nectar Straw Delayed swallow initiation-pyriform sinuses;Reduced airway/laryngeal closure;Penetration/Aspiration during swallow Pharyngeal Material enters airway, passes BELOW cords without attempt by patient to eject out (silent aspiration) Pharyngeal- Thin Teaspoon Delayed swallow initiation-pyriform sinuses;Reduced airway/laryngeal closure Pharyngeal -- Pharyngeal- Thin Cup -- Pharyngeal -- Pharyngeal- Thin Straw Delayed swallow initiation-pyriform sinuses;Reduced airway/laryngeal closure;Penetration/Aspiration before swallow Pharyngeal Material enters airway, passes BELOW cords without attempt by patient to eject out (silent aspiration) Pharyngeal- Puree Delayed swallow initiation-pyriform sinuses;Reduced airway/laryngeal closure Pharyngeal -- Pharyngeal- Mechanical Soft -- Pharyngeal -- Pharyngeal- Regular -- Pharyngeal -- Pharyngeal- Multi-consistency -- Pharyngeal -- Pharyngeal- Pill -- Pharyngeal -- Pharyngeal Comment --  CHL IP CERVICAL ESOPHAGEAL PHASE 04/28/2017 Cervical Esophageal Phase WFL Pudding Teaspoon -- Pudding Cup -- Honey Teaspoon -- Honey Cup -- Nectar Teaspoon -- Nectar Cup -- Nectar Straw -- Thin Teaspoon -- Thin Cup -- Thin Straw -- Puree -- Mechanical Soft -- Regular -- Multi-consistency -- Pill -- Cervical Esophageal Comment -- No flowsheet data found. [name] 04/28/2017, 11:05 AM  [name], M.A. CCC-SLP [phone]              Lab Data:  CBC:  Recent Labs Lab 05/12/17 0536 05/13/17 0524 05/15/17 0507 05/17/17 0320  WBC 13.8* 14.7* 13.0* 14.7*  HGB 12.3* 12.1* 11.9* 12.3*  HCT 38.9* 40.3 40.0 40.9  MCV 91.5 92.4 93.5 92.7  PLT 204 251 226 259   Basic Metabolic Panel:  Recent Labs Lab 05/15/17 0507 05/17/17 0320  NA 137 137    K 4.5 4.4  CL 102 99*  CO2 27 25  GLUCOSE 142* 109*  BUN 28* 29*  CREATININE 0.75 0.75  CALCIUM 10.6* 10.9*   GFR: Estimated Creatinine Clearance: 106.5 mL/min (by C-G formula based on SCr of 0.75 mg/dL). Liver Function Tests: No results for input(s): AST, ALT, ALKPHOS, BILITOT, PROT, ALBUMIN in the last 168 hours. No results for input(s): LIPASE, AMYLASE in the last 168 hours. No results for input(s): AMMONIA in the last 168 hours. Coagulation Profile: No results  for input(s): INR, PROTIME in the last 168 hours. Cardiac Enzymes: No results for input(s): CKTOTAL, CKMB, CKMBINDEX, TROPONINI in the last 168 hours. BNP (last 3 results) No results for input(s): PROBNP in the last 8760 hours. HbA1C: No results for input(s): HGBA1C in the last 72 hours. CBG:  Recent Labs Lab 05/17/17 1631 05/17/17 1752 05/17/17 2150 05/18/17 0620 05/18/17 1125  GLUCAP 118* 122* 124* 131* 130*   Lipid Profile: No results for input(s): CHOL, HDL, LDLCALC, TRIG, CHOLHDL, LDLDIRECT in the last 72 hours. Thyroid Function Tests: No results for input(s): TSH, T4TOTAL, FREET4, T3FREE, THYROIDAB in the last 72 hours. Anemia Panel: No results for input(s): VITAMINB12, FOLATE, FERRITIN, TIBC, IRON, RETICCTPCT in the last 72 hours. Urine analysis:    Component Value Date/Time   COLORURINE YELLOW 05/02/2017 1353   APPEARANCEUR CLOUDY (A) 05/02/2017 1353   LABSPEC 1.017 05/02/2017 1353   PHURINE 7.0 05/02/2017 1353   GLUCOSEU NEGATIVE 05/02/2017 1353   HGBUR NEGATIVE 05/02/2017 1353   BILIRUBINUR NEGATIVE 05/02/2017 1353   KETONESUR NEGATIVE 05/02/2017 1353   PROTEINUR 100 (A) 05/02/2017 1353   NITRITE NEGATIVE 05/02/2017 1353   LEUKOCYTESUR NEGATIVE 05/02/2017 1353     Liticia Gasior M.D. [facility] 05/18/2017, 11:45 AM  Pager: [phone] Between 7am to 7pm - call Pager - [phone]  After 7pm go to [email or web address] - password [email or web address]  Call night coverage person covering after 7pm

## 2017-05-19 LAB — GLUCOSE, CAPILLARY
GLUCOSE-CAPILLARY: 117 mg/dL — AB (ref 65–99)
Glucose-Capillary: 157 mg/dL — ABNORMAL HIGH (ref 65–99)
Glucose-Capillary: 155 mg/dL — ABNORMAL HIGH (ref 65–99)
Glucose-Capillary: 143 mg/dL — ABNORMAL HIGH (ref 65–99)
Glucose-Capillary: 122 mg/dL — ABNORMAL HIGH (ref 65–99)

## 2017-05-19 MED ORDER — APIXABAN 5 MG PO TABS
5.0000 mg | ORAL_TABLET | Freq: Two times a day (BID) | ORAL | Status: DC
  Administered 2017-05-19 – 2017-05-29 (×20): 5 mg
  Filled 2017-05-19 (×21): qty 1

## 2017-05-19 NOTE — Care Management Note (Signed)
Case Management Note  Patient Details  Name: Marlene Pfluger MRN: [id number] Date of Birth: 05-Nov-1960  Subjective/Objective:                    Action/Plan: Patient continues on the difficult to place list for SNF. CM following.  Expected Discharge Date:  04/28/17               Expected Discharge Plan:  [place]  In-House Referral:  Clinical Social Work, Scientist, research (medical)  CM Consult  Post Acute Care Choice:    Choice offered to:     DME Arranged:    DME Agency:     HH Arranged:    [place] Agency:     Status of Service:  In process, will continue to follow  If discussed at Long Length of Stay Meetings, dates discussed:    Additional Comments:  Pollie Friar, RN 05/19/2017, 10:11 AM

## 2017-05-19 NOTE — Progress Notes (Signed)
  Speech Language Pathology Treatment: Dysphagia  Patient Details Name: Joe Wilkinson MRN: [id number] DOB: 02-23-1961 Today's Date: 05/19/2017 Time: 8250-0370 SLP Time Calculation (min) (ACUTE ONLY): 24 min  Assessment / Plan / Recommendation Clinical Impression  Pt seen during lunch meal, although with minimal interest in items on tray. He was agreeable to only two bites of chopped solids, with Mod cues provided by SLP for emergent awareness and self-management of left-sided pocketing and anterior loss. Pt had little to no residue with his pureed food, and no overt s/s of aspiration were observed. Pt overestimates his abilities, showing intellectual but not yet anticipatory awareness of his physical limitations, with pt asking to be allowed to get up and walk around his room by himself.   HPI HPI: 56 yo male brought to [facility] with altered mental status from DKA and sepsis. 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 Required Rt decompressive craniectomy and remained on vent post op.ETT 8/23-9/5, s/p tracheostomy 9/5.       SLP Plan  Continue with current plan of care       Recommendations  Diet recommendations: Dysphagia 2 (fine chop);Thin liquid Liquids provided via: Cup;Straw Medication Administration: Crushed with puree Supervision: Staff to assist with self feeding;Full supervision/cueing for compensatory strategies Compensations: Minimize environmental distractions;Follow solids with liquid;Monitor for anterior loss;Lingual sweep for clearance of pocketing;Small sips/bites Postural Changes and/or Swallow Maneuvers: Seated upright 90 degrees      Patient may use Passy-Muir Speech Valve: During all waking hours (remove during sleep) PMSV Supervision: Intermittent         Oral Care Recommendations: Oral care BID Follow up Recommendations: Skilled Nursing facility SLP Visit Diagnosis: Dysphagia, oropharyngeal phase (R13.12) Plan: Continue with current plan of care        GO                Joe Wilkinson 05/19/2017, 12:57 PM  Joe Wilkinson, M.A. CCC-SLP [phone]

## 2017-05-19 NOTE — Progress Notes (Signed)
Physical Therapy Treatment Patient Details Name: Joe Wilkinson MRN: [id number] DOB: 1960-10-21 Today's Date: 05/19/2017    History of Present Illness 56 yo male brought to [facility] with altered mental status from DKA and sepsis.  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  Required Rt decompressive craniectomy and remained on vent post op.ETT 8/23-9/5, s/p tracheostomy 9/5    PT Comments    Pt continues to need mod/max assistance of 2 people for mobility. Progressing toward goals slowly. Acute PT to continued during pt's hospital stay.    Follow Up Recommendations  SNF;Supervision/Assistance - 24 hour     Equipment Recommendations  None recommended by PT    Recommendations for Other Services       Precautions / Restrictions Precautions Precautions: Fall;Other (comment) Precaution Comments: craniectomy, no bone flap right side; trach in place; peg tube; L side neglect Restrictions Other Position/Activity Restrictions: craniectomy, no bone flap right side    Mobility  Bed Mobility Overal bed mobility: Needs Assistance Bed Mobility: Rolling;Supine to Sit Rolling: +2 for physical assistance;Max assist   Supine to sit: Mod assist;HOB elevated;+2 for physical assistance     General bed mobility comments: max assist to roll onto left side with hand over hand guidance of right hand to rail. mod assist to perform partial roll to right. mod assist for coming to sitting at edge of bed with pt assisting minimally to advance legs to/off edge and minimal use of arms to elevate trunk into sitting at edge of bed.   Transfers Overall transfer level: Needs assistance   Transfers: Sit to/from Stand;Stand Pivot Transfers   Stand pivot transfers: Mod assist;+2 physical assistance;From elevated surface Squat pivot transfers: Max assist;+2 physical assistance     General transfer comment: mod assist to stand x2 from elevated bed with bil LE's blocked. cues/facilitation for anterior weight  shifting to assist with standing. pt able to hold onto PTA/SPTA and assist with UE's. on 1st stand worked on upright posture and lateral weight shifting with no buckling noted of knees, good weight acceptance with min/mod assist for standing balance. on 2cd stand SPT performed bed to chair with no movements of LE"s to assist noted.       Modified Rankin (Stroke Patients Only) Modified Rankin (Stroke Patients Only) Pre-Morbid Rankin Score: No symptoms Modified Rankin: Severe disability     Balance Overall balance assessment: Needs assistance Sitting-balance support: Feet supported;Single extremity supported Sitting balance-Leahy Scale: Poor Sitting balance - Comments: neuro re-ed activities to work on midline, left side orientation. performed ant/posterior weight shifting, elbow taps to bed. min to mod assist needed wtih 2cd person behind pt for safety.  Postural control: Left lateral lean;Posterior lean   Standing balance-Leahy Scale: Zero Standing balance comment: 2 person mod/max assist to stand and for standing balance with UE support          Cognition Arousal/Alertness: Awake/alert Behavior During Therapy: Flat affect Overall Cognitive Status: Impaired/Different from baseline Area of Impairment: Problem solving;Awareness;Safety/judgement;Following commands;Attention;Memory                   Current Attention Level: Sustained Memory: Decreased recall of precautions;Decreased short-term memory Following Commands: Follows one step commands with increased time;Follows one step commands inconsistently;Follows multi-step commands inconsistently;Follows multi-step commands with increased time Safety/Judgement: Decreased awareness of safety;Decreased awareness of deficits Awareness: Intellectual Problem Solving: Slow processing;Decreased initiation;Difficulty sequencing;Requires verbal cues;Requires tactile cues General Comments: Pt requiring cues to attend to L side.  Pertinent Vitals/Pain Pain Assessment: 0-10 Pain Score: 4  Pain Location: Left leg and hand Pain Descriptors / Indicators: Aching;Tender;Guarding;Grimacing Pain Intervention(s): Limited activity within patient's tolerance;Monitored during session;Patient requesting pain meds-RN notified;Repositioned     PT Goals (current goals can now be found in the care plan section) Acute Rehab PT Goals Patient Stated Goal: none stated PT Goal Formulation: Patient unable to participate in goal setting Time For Goal Achievement: 05/18/17 Potential to Achieve Goals: Fair    Frequency    Min 3X/week      PT Plan Current plan remains appropriate    AM-PAC PT "6 Clicks" Daily Activity  Outcome Measure  Difficulty turning over in bed (including adjusting bedclothes, sheets and blankets)?: Unable Difficulty moving from lying on back to sitting on the side of the bed? : Unable Difficulty sitting down on and standing up from a chair with arms (e.g., wheelchair, bedside commode, etc,.)?: Unable Help needed moving to and from a bed to chair (including a wheelchair)?: Total Help needed walking in hospital room?: Total Help needed climbing 3-5 steps with a railing? : Total 6 Click Score: 6    End of Session Equipment Utilized During Treatment: Gait belt Activity Tolerance: Patient tolerated treatment well;No increased pain Patient left: in chair;with call bell/phone within reach;with chair alarm set Nurse Communication: Mobility status;Need for lift equipment PT Visit Diagnosis: Other symptoms and signs involving the nervous system (R29.898);Hemiplegia and hemiparesis;Unsteadiness on feet (R26.81);Muscle weakness (generalized) (M62.81) Hemiplegia - Right/Left: Left Hemiplegia - dominant/non-dominant: Non-dominant Hemiplegia - caused by: Nontraumatic intracerebral hemorrhage     Time: 1633-1700 PT Time Calculation (min) (ACUTE ONLY): 27 min  Charges:  $Therapeutic Activity: 8-22  mins $Neuromuscular Re-education: 8-22 mins                    Willow Ora, PTA, CLT Acute Rehab Services Office- [phone] 05/19/17, 5:19 PM   Willow Ora 05/19/2017, 5:18 PM

## 2017-05-19 NOTE — Progress Notes (Signed)
Nutrition Follow-up  DOCUMENTATION CODES:   Not applicable  INTERVENTION:   -Recommend continuing current TF regimen; pt may benefit from transitioning to night-time feedings or bolus feedings on follow-up    NUTRITION DIAGNOSIS:   Biting/chewing difficulty related to dysphagia as evidenced by  (Dysphagia 1 diet, need for TF).  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   Pt awaiting SNF placement, remains on trach collar  Tolerating Glucerna 1.2 at rate of 65 ml/hr with Pro-Stat BID via G-tube  Pt on Dysphagia II diet as well; recorded po intake 5-40% of meals  Labs: reviewed Meds: reviewed  Diet Order:  DIET DYS 2 Room service appropriate? Yes; Fluid consistency: Thin  Skin:  Wound (see comment) (perianal cellulitis, no pressure ulcers)  Last BM:  10/4  Height:   Ht Readings from Last 1 Encounters:  04/26/17 '5\' 10"'$  (1.778 m)    Weight:   Wt Readings from Last 1 Encounters:  05/12/17 184 lb 9.6 oz (83.7 kg)    Ideal Body Weight:  75.45 kg  BMI:  Body mass index is 26.49 kg/m.  Estimated Nutritional Needs:   Kcal:  2100-2300  Protein:  115-130 gm  Fluid:  2.1-2.3 L  EDUCATION NEEDS:   No education needs identified at this time  [place], [place], LDN [phone] Pager  [phone] Weekend/On-Call Pager

## 2017-05-19 NOTE — Progress Notes (Signed)
[facility]                                                                              Patient Demographics  Joe Wilkinson, is a 56 y.o. male, DOB - 30-Mar-1961, VVO:[id number]  Admit date - 03/31/2017   Admitting Physician Kerney Elbe, MD  Outpatient Primary MD for the patient is Patient, No Pcp Per  Outpatient specialists:   LOS - 48  days   Medical records reviewed and are as summarized below:    No chief complaint on file.      Brief summary  HPI on 04/01/2017 by Dr. 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 He was transferred to [facility] ICU for NeuroSx care, seen by neurosurgery and Neuro with plans for craniotomy. He was intubated and sedated and unable to provide any HPI, ROS, PMH, PSH, FH, home meds, allergies. Interim history  He was transferred to the ICU and underwent decompressive right craniectomy 8/25. Hypertonic saline protocol was completed. He required transfusion support and was treated for AFib with RVR, continued on the ventilator, eventually receiving trach 9/5 and PEG 9/6, on ATC 9/7. Patient does not have health insurance and because of his craniectomy without the ability to fit a helmet, most skilled nursing facilities areunwilling to accept him. Currently he is getting meds through PEG and continuous TF. Heparin was converted to prophylaxis Lovenox. He was transferred from [facility] service to [facility] service on 9/25. Currently awaiting placement to snf.   Assessment & Plan   Massive right MCA territory infarct, PCA watershed infarct- acute CVA - the patient was found unresponsive, hemiparesis and was found to have acute right MCA CVA - Neurosurgery, neurology consulted, post craniotomy, cytotoxic edema requiring decompression with craniotomy, hypersaline treatment - Continue trach, PEG for dysphagia  -  Neurosurgery follow-up in 2 months. - LDL 38, hemoglobin A1c 14.3 - Repeat CT head showed further swelling in the region of complete right MCA territory infarction with more extensive bulging of the brain through the craniectomy defect. No midline shift. At superior margin there could be some extension of the swollen brain through dural defect. At the superior margin, small amount of petechial bleeding within the region of infarction - awaiting SNF or long-term care, no acute issues, medically stable    Acute respiratory failure with hypoxia and hypercarbia - the patient was intubated at the time of admission, status post trach - Continue pulmonary hygiene, followed by PCCM - per PCCM on 10/9, #4 cuffless Shiley change to another #4 cuffless   Septic shock secondary to S agalactiae pneumonia - resolved, complete antibiotics on 8/30. However placed on Zosyn from 9/129/8 due to concerns for aspiration.   New-onset atrial fibrillation with RVR - currently normal sinus rhythm, [place] vasc 4 - patient was not placed on an decortication due to anemia requiring 4 units packed RBCs. Hemoglobin has remained stable, started on Eliquis by neurology   Blood loss anemia- acute on chronic - unclear source,FOBT negative - Hemoglobin has remained stable in  the past several weeks, follow CBC   Escherichia coli, Klebsiella UTI - completed Keflex on 05/15/17 for 10 days  Nutrition, dysphagia - continue tube feeds, also placed on dysphagia 2 diet.  Perianal cellulitis with possible abscess - Gen. Surgery was consulted,was placed on clindamycin   Code Status:full CODE STATUS DVT Prophylaxis:  eliquis  Family Communication: no family at the bedside  Disposition Plan: pending skilled nursing facility  Time Spent in minutes   15 minutes  Procedures:  -LP 8/24 >> glucose 205, RBC 16, protein 62, WBC 5 -CT head 8/24 >> Large Rt MCA -8/25 decompressive craniectomy -EEG 8/27 >> bilateral cerebral  dysfunction, non-specific in etiology -Echo 8/27 >> EF 65 to 70% -CT abd/pelvis 8/30 >> third spacing, 3.5 cm low attenuation Lt hepatic lobe -CT Head 9/3>> Severe right hemisphere cytotoxic edema with stable extension of edematous brain through the right craniectomy defect. Stable petechial hemorrhage without malignant hemorrhagicTransformation. Stable intracranial mass effect with leftward midline shift of 7 mm. Small left PCA territory infarct. -9/5 Trach placement.  -9/6 PEG tube placement.  -CXR 9/23 > LLL atx and small effusion -Barrium swallow 9/21 > severe oral and mod pharyngeal dysphagia; mod to severe aspiration risk -CT head on 10/1 shows Further swelling in the region of the complete right MCA territory infarction with more extensive bulging of the brain through the craniectomy defect. No midline shift.   Consultants:   PCCM Neurology Neurosurgery General surgery   Antimicrobials:      Medications  Scheduled Meds: . acetaminophen (TYLENOL) oral liquid 160 mg/5 mL  650 mg Per Tube TID  . apixaban  5 mg Per Tube BID  . chlorhexidine  15 mL Mouth Rinse BID  . feeding supplement (PRO-STAT SUGAR FREE 64)  30 mL Per Tube BID  . free water  250 mL Per Tube Q8H  . gabapentin  300 mg Oral QHS  . glycopyrrolate  1 mg Per Tube BID  . insulin aspart  0-20 Units Subcutaneous TID WC  . insulin aspart  0-5 Units Subcutaneous QHS  . insulin detemir  34 Units Subcutaneous Daily  . metoprolol tartrate  12.5 mg Per Tube BID  . pantoprazole sodium  40 mg Per Tube Daily   Continuous Infusions: . feeding supplement (GLUCERNA 1.2 CAL) 1,000 mL (05/19/17 1043)   PRN Meds:.bisacodyl, docusate, ibuprofen, ipratropium-albuterol, [DISCONTINUED] ondansetron **OR** ondansetron (ZOFRAN) IV, promethazine   Antibiotics   Anti-infectives    Start     Dose/Rate Route Frequency Ordered Stop   05/05/17 2200  cephALEXin (KEFLEX) capsule 500 mg     500 mg Oral Every 12 hours 05/05/17 1640  05/15/17 1042   04/26/17 0600  vancomycin (VANCOCIN) IVPB 750 mg/150 ml premix  Status:  Discontinued     750 mg 150 mL/hr over 60 Minutes Intravenous Every 12 hours 04/25/17 1729 04/25/17 1730   04/25/17 1830  clindamycin (CLEOCIN) capsule 300 mg     300 mg Oral Every 6 hours 04/25/17 1730 04/29/17 1717   04/25/17 1800  vancomycin (VANCOCIN) IVPB 750 mg/150 ml premix  Status:  Discontinued     750 mg 150 mL/hr over 60 Minutes Intravenous Every 12 hours 04/25/17 1728 04/25/17 1729   04/23/17 0945  piperacillin-tazobactam (ZOSYN) IVPB 3.375 g  Status:  Discontinued     3.375 g 12.5 mL/hr over 240 Minutes Intravenous Every 8 hours 04/23/17 0930 04/25/17 1730   04/23/17 0430  vancomycin (VANCOCIN) IVPB 1000 mg/200 mL premix  Status:  Discontinued  1,000 mg 200 mL/hr over 60 Minutes Intravenous Every 12 hours 04/22/17 1655 04/25/17 1728   04/22/17 1630  vancomycin (VANCOCIN) IVPB 1000 mg/200 mL premix     1,000 mg 200 mL/hr over 60 Minutes Intravenous  Once 04/22/17 1627 04/22/17 1826   04/08/17 0200  piperacillin-tazobactam (ZOSYN) IVPB 3.375 g  Status:  Discontinued     3.375 g 12.5 mL/hr over 240 Minutes Intravenous Every 8 hours 04/08/17 0058 04/15/17 1601   04/08/17 0100  piperacillin-tazobactam (ZOSYN) IVPB 3.375 g  Status:  Discontinued     3.375 g 100 mL/hr over 30 Minutes Intravenous Every 6 hours 04/08/17 0054 04/08/17 0057   04/03/17 1200  ampicillin (OMNIPEN) 2 g in sodium chloride 0.9 % 50 mL IVPB     2 g 150 mL/hr over 20 Minutes Intravenous Every 6 hours 04/03/17 1005 04/06/17 1837   04/01/17 1800  vancomycin (VANCOCIN) IVPB 1000 mg/200 mL premix  Status:  Discontinued     1,000 mg 200 mL/hr over 60 Minutes Intravenous Every 24 hours 04/01/17 0146 04/01/17 0154   04/01/17 1600  ceFAZolin (ANCEF) IVPB 2g/100 mL premix     2 g 200 mL/hr over 30 Minutes Intravenous Every 8 hours 04/01/17 1100 04/02/17 0056   04/01/17 0828  bacitracin 50,000 Units in sodium chloride  irrigation 0.9 % 500 mL irrigation  Status:  Discontinued       As needed 04/01/17 0837 04/01/17 0947   04/01/17 0600  piperacillin-tazobactam (ZOSYN) IVPB 3.375 g  Status:  Discontinued     3.375 g 12.5 mL/hr over 240 Minutes Intravenous Every 8 hours 04/01/17 0146 04/01/17 0154   04/01/17 0600  vancomycin (VANCOCIN) IVPB 750 mg/150 ml premix  Status:  Discontinued     750 mg 150 mL/hr over 60 Minutes Intravenous Every 12 hours 04/01/17 0225 04/03/17 0959   04/01/17 0400  piperacillin-tazobactam (ZOSYN) IVPB 3.375 g  Status:  Discontinued     3.375 g 12.5 mL/hr over 240 Minutes Intravenous Every 8 hours 04/01/17 0225 04/03/17 0959        Subjective:   Joe Wilkinson was seen and examined today. No complaints. Patient denies dizziness, chest pain, shortness of breath, abdominal pain, N/V/D/C, new weakness, numbess, tingling.   Objective:   Vitals:   05/19/17 0424 05/19/17 0500 05/19/17 0756 05/19/17 0935  BP:  120/76  125/72  Pulse: 84 92 90 (!) 101  Resp: 18 18 16 18   Temp:  98.6 F (37 C)  98.3 F (36.8 C)  TempSrc:  Axillary  Oral  SpO2: 97% 97% 99% 98%  Weight:      Height:        Intake/Output Summary (Last 24 hours) at 05/19/17 1148 Last data filed at 05/19/17 0900  Gross per 24 hour  Intake              140 ml  Output             2250 ml  Net            -2110 ml     Wt Readings from Last 3 Encounters:  05/12/17 83.7 kg (184 lb 9.6 oz)  03/30/17 113.4 kg (250 lb)     Exam   General: Alert and oriented x 3, NAD  Eyes:   HEENT:  Right craniectomy, trach  Cardiovascular: S1 S2 auscultated, RRR  Respiratory: Clear to auscultation bilaterally, no wheezing, rales or rhonchi  Gastrointestinal: Soft, nontender, nondistended, + bowel sounds, PEG +  Ext: no pedal  edema bilaterally  Neuro: left hemiparesis  Musculoskeletal: No digital cyanosis, clubbing  Skin: No rashes  Psych: Normal affect and demeanor, alert and oriented x3    Data Reviewed:   I have personally reviewed following labs and imaging studies  Micro Results No results found for this or any previous visit (from the past 240 hour(s)).  Radiology Reports Ct Head Wo Contrast  Result Date: 05/08/2017 CLINICAL DATA:  Followup craniectomy.  Headache. EXAM: CT HEAD WITHOUT CONTRAST TECHNIQUE: Contiguous axial images were obtained from the base of the skull through the vertex without intravenous contrast. COMPARISON:  04/10/2017 and multiple previous FINDINGS: Brain: Massive swelling in the region of the complete right MCA territory infarction with more protrusion of the infarcted brain tissue through the craniectomy defect. Mild petechial bleeding within the superior portion of the infarction but without frank hematoma. No new vascular territory involvement. No midline shift because of the decompressive craniectomy. No ventricular trapping. No extra-axial collection. Along the upper region of the craniectomy, I wonder if there could actually be a dural defect through which some of the swollen brain has extended. Vascular: No other vascular finding. Skull: No unexpected finding related to the craniectomy. Sinuses/Orbits: No significant sinus finding. Old blowout fracture on the right. Other: None significant IMPRESSION: Further swelling in the region of the complete right MCA territory infarction with more extensive bulging of the brain through the craniectomy defect. No midline shift however, because of the decompressive craniectomy. At the superior margin, there could be some extension of the swollen brain through a dural defect. Also at the superior margin, there is a small amount of petechial bleeding within the region of infarction. Electronically Signed   By: Nelson Chimes M.D.   On: 05/08/2017 14:12   Ct Abdomen Pelvis W Contrast  Result Date: 04/22/2017 CLINICAL DATA:  Perianal abscess. Status post stroke and complicated recovery. EXAM: CT ABDOMEN AND PELVIS WITH CONTRAST TECHNIQUE:  Multidetector CT imaging of the abdomen and pelvis was performed using the standard protocol following bolus administration of intravenous contrast. CONTRAST:  173mL ISOVUE-300 IOPAMIDOL (ISOVUE-300) INJECTION 61% COMPARISON:  CT abdomen and pelvis April 06, 2017 FINDINGS: LOWER CHEST: Bilateral lower lobe atelectasis. Heart size is upper limits of normal. No pericardial effusion. Included heart size is normal. No pericardial effusion. HEPATOBILIARY: 2.5 cm heterogeneously enhancing mass LEFT lobe of the liver with centripetal puddling. Liver is otherwise unremarkable. Status post cholecystectomy. PANCREAS: Normal. SPLEEN: Normal. ADRENALS/URINARY TRACT: Kidneys are orthotopic, demonstrating symmetric enhancement. No nephrolithiasis, hydronephrosis or solid renal masses. Too small to characterize hypodensities in the kidneys bilaterally. The unopacified ureters are normal in course and caliber. Delayed imaging through the kidneys demonstrates symmetric prompt contrast excretion within the proximal urinary collecting system. Urinary bladder is partially distended and unremarkable. Normal adrenal glands. STOMACH/BOWEL: The stomach, small and large bowel are normal in course and caliber without inflammatory changes, sensitivity decreased without oral contrast. Intraluminal gastrostomy retaining bulb. Moderate colonic diverticulosis. Normal appendix. VASCULAR/LYMPHATIC: Aortoiliac vessels are normal in course and caliber. Mild calcific atherosclerosis. No lymphadenopathy by CT size criteria. Prominent inguinal lymph nodes are likely reactive. REPRODUCTIVE: Normal. OTHER: No intraperitoneal free fluid or free air. MUSCULOSKELETAL: Perianal soft tissue stranding and soft tissue density without focal fluid collection, subcutaneous gas or radiopaque foreign bodies inferior extent of process incompletely imaged. No intraperitoneal extension. Small bilateral fat containing inguinal hernias. Small fat containing umbilical  hernia. Severe LEFT L5-S1 facet arthropathy. IMPRESSION: 1. Perianal cellulitis without drainable fluid collection ; inferior extent of process  not imaged. 2. No acute intra-abdominal or pelvic process. 3. 2.5 cm hepatic hemangioma. Aortic Atherosclerosis (ICD10-I70.0). Electronically Signed   By: Elon Alas M.D.   On: 04/22/2017 23:37   Dg Chest Port 1 View  Result Date: 05/13/2017 CLINICAL DATA:  Leukocytosis. EXAM: PORTABLE CHEST 1 VIEW COMPARISON:  05/05/2017 FINDINGS: Tracheostomy remains in place. There is mild atelectasis at both lung bases. No consolidation or lobar collapse. Upper lungs are clear. No heart failure or effusion. IMPRESSION: Mild bibasilar atelectasis. Electronically Signed   By: Nelson Chimes M.D.   On: 05/13/2017 08:55   Dg Chest Port 1 View  Result Date: 05/05/2017 CLINICAL DATA:  Neck pain EXAM: PORTABLE CHEST 1 VIEW COMPARISON:  Chest radiograph 04/30/2017 FINDINGS: Tracheostomy tube tip is at the level of the clavicular heads. Cardiomediastinal contours are unchanged. No focal airspace consolidation or pulmonary edema. No pneumothorax. Unchanged probable left pleural effusion and associated atelectasis. IMPRESSION: Unchanged small left pleural effusion and associated atelectasis. Electronically Signed   By: Ulyses Jarred M.D.   On: 05/05/2017 20:58   Dg Chest Port 1 View  Result Date: 04/30/2017 CLINICAL DATA:  Respiratory failure EXAM: PORTABLE CHEST 1 VIEW COMPARISON:  04/24/2017 FINDINGS: Left lower lobe airspace disease has progressed in the interval. Small left effusion. Right lung clear. Tracheostomy remains in good position IMPRESSION: Progression of mild left lower lobe atelectasis/ infiltrate and small left effusion. Electronically Signed   By: Franchot Gallo M.D.   On: 04/30/2017 15:20   Dg Chest Port 1 View  Result Date: 04/24/2017 CLINICAL DATA:  Replacement of tracheostomy tube today. EXAM: PORTABLE CHEST 1 VIEW COMPARISON:  Single-view of the chest  04/22/2017. FINDINGS: Tracheostomy tube is in place with the tip in good position at the level of the clavicular heads. Lungs are clear. Heart size is normal. No pneumothorax or pleural effusion. No acute abnormality. IMPRESSION: Tracheostomy tube in good position.  No acute disease. Electronically Signed   By: Inge Rise M.D.   On: 04/24/2017 12:20   Dg Chest Port 1 View  Result Date: 04/22/2017 CLINICAL DATA:  Followup respiratory failure. EXAM: PORTABLE CHEST 1 VIEW COMPARISON:  04/18/2017 FINDINGS: Tracheostomy remains in place. Upper lungs are clear. Mild volume loss in both lower lobes. No worsening or new findings. IMPRESSION: Persistent mild volume loss in both lower lobes. Electronically Signed   By: Nelson Chimes M.D.   On: 04/22/2017 08:25   Dg Abd 2 Views  Result Date: 05/06/2017 CLINICAL DATA:  Abdominal distension. EXAM: ABDOMEN - 2 VIEW COMPARISON:  May 02, 2017. FINDINGS: No abnormal bowel dilatation is noted. Gastrostomy tube is seen projected over gastric air bubble. Residual contrast is seen in the colon. No abnormal calcifications are noted. IMPRESSION: No evidence of bowel obstruction or ileus. Electronically Signed   By: Marijo Conception, M.D.   On: 05/06/2017 21:02   Dg Abd Portable 1v  Result Date: 05/02/2017 CLINICAL DATA:  Abdomen pain EXAM: PORTABLE ABDOMEN - 1 VIEW COMPARISON:  None. FINDINGS: The bowel gas pattern is normal. No radio-opaque calculi or other significant radiographic abnormality are seen. Surgical clips are projected over the right pelvis. IMPRESSION: No acute abnormality. Electronically Signed   By: Abelardo Diesel M.D.   On: 05/02/2017 15:33   Dg Swallowing Func-speech Pathology  Result Date: 05/05/2017 Objective Swallowing Evaluation: Type of Study: MBS-Modified Barium Swallow Study Patient Details Name: Natale Thoma MRN: [id number] Date of Birth: 1960-10-12 Today's Date: 05/05/2017 Time: SLP Start Time (ACUTE ONLY): 1350-SLP Stop Time (ACUTE  ONLY): 1410 SLP Time Calculation (min) (ACUTE ONLY): 20 min Past Medical History: No past medical history on file. Past Surgical History: Past Surgical History: Procedure Laterality Date . CRANIECTOMY Right 04/01/2017  Procedure: RIGHT DECOMPRESSIVE CRANIECTOMY;  Surgeon: Ditty, Kevan Ny, MD;  Location: [place];  Service: Neurosurgery;  Laterality: Right; . ESOPHAGOGASTRODUODENOSCOPY N/A 04/13/2017  Procedure: ESOPHAGOGASTRODUODENOSCOPY (EGD);  Surgeon: Georganna Skeans, MD;  Location: [place];  Service: General;  Laterality: N/A;  bedside . PEG PLACEMENT N/A 04/13/2017  Procedure: PERCUTANEOUS ENDOSCOPIC GASTROSTOMY (PEG) PLACEMENT;  Surgeon: Georganna Skeans, MD;  Location: [facility] ENDOSCOPY;  Service: General;  Laterality: N/A; HPI: 56 yo male brought to [facility] with altered mental status from DKA and sepsis. Found to have RT MCA CVA and transfer to [facility]. Required Rt decompressive craniectomy and remained on vent post op.ETT 8/23-9/5, s/p tracheostomy 9/5.  Subjective: pt eager for POs Assessment / Plan / Recommendation CHL IP CLINICAL IMPRESSIONS 05/05/2017 Clinical Impression  Pt demonstrates cognitive impairment impacting swallow function. Prolonged liangual rocking and pumping occurs with most boluses, particulalry purees, though a liquid wash is beneficial to facilitate oral transit. Swallow response is delayed reuslting in flash penetration events. Otherwise oropharyngeal mechanism is WNL. Pt may initaite a puree/thin diet though intake may be poor and assistance will be needed for attention with feeding and to avoid prolonged oral holding. PMSV to be in place with all PO. WIll f/u for tolerance.  SLP Visit Diagnosis Dysphagia, oropharyngeal phase (R13.12) Attention and concentration deficit following -- Frontal lobe and executive function deficit following -- Impact on safety and function --   CHL IP TREATMENT RECOMMENDATION 05/05/2017 Treatment Recommendations Therapy as outlined in treatment plan below    Prognosis 05/05/2017 Prognosis for Safe Diet Advancement Good Barriers to Reach Goals -- Barriers/Prognosis Comment -- CHL IP DIET RECOMMENDATION 05/05/2017 SLP Diet Recommendations Thin liquid;Dysphagia 1 (Puree) solids Liquid Administration via Cup;Straw Medication Administration Crushed with puree Compensations Minimize environmental distractions;Follow solids with liquid Postural Changes Seated upright at 90 degrees   CHL IP OTHER RECOMMENDATIONS 05/05/2017 Recommended Consults -- Oral Care Recommendations Other (Comment);Oral care BID Other Recommendations Have oral suction available;Place PMSV during PO intake   CHL IP FOLLOW UP RECOMMENDATIONS 05/05/2017 Follow up Recommendations Skilled Nursing facility   [facility] IP FREQUENCY AND DURATION 05/05/2017 Speech Therapy Frequency (ACUTE ONLY) min 2x/week Treatment Duration 2 weeks      CHL IP ORAL PHASE 05/05/2017 Oral Phase Impaired Oral - Pudding Teaspoon -- Oral - Pudding Cup -- Oral - Honey Teaspoon NT Oral - Honey Cup -- Oral - Nectar Teaspoon -- Oral - Nectar Cup -- Oral - Nectar Straw NT Oral - Thin Teaspoon NT Oral - Thin Cup -- Oral - Thin Straw Holding of bolus;Lingual pumping Oral - Puree Lingual pumping;Holding of bolus Oral - Mech Soft -- Oral - Regular Lingual pumping;Holding of bolus Oral - Multi-Consistency -- Oral - Pill -- Oral Phase - Comment --  CHL IP PHARYNGEAL PHASE 05/05/2017 Pharyngeal Phase Impaired Pharyngeal- Pudding Teaspoon -- Pharyngeal -- Pharyngeal- Pudding Cup -- Pharyngeal -- Pharyngeal- Honey Teaspoon NT Pharyngeal -- Pharyngeal- Honey Cup -- Pharyngeal -- Pharyngeal- Nectar Teaspoon -- Pharyngeal -- Pharyngeal- Nectar Cup -- Pharyngeal -- Pharyngeal- Nectar Straw NT Pharyngeal -- Pharyngeal- Thin Teaspoon NT Pharyngeal -- Pharyngeal- Thin Cup -- Pharyngeal -- Pharyngeal- Thin Straw Delayed swallow initiation-pyriform sinuses;Penetration/Aspiration during swallow Pharyngeal Material enters airway, remains ABOVE vocal cords then ejected  out;Material does not enter airway Pharyngeal- Puree Delayed swallow initiation-pyriform sinuses Pharyngeal -- Pharyngeal- Mechanical Soft Delayed  swallow initiation-pyriform sinuses Pharyngeal -- Pharyngeal- Regular -- Pharyngeal -- Pharyngeal- Multi-consistency -- Pharyngeal -- Pharyngeal- Pill -- Pharyngeal -- Pharyngeal Comment --  CHL IP CERVICAL ESOPHAGEAL PHASE 04/28/2017 Cervical Esophageal Phase WFL Pudding Teaspoon -- Pudding Cup -- Honey Teaspoon -- Honey Cup -- Nectar Teaspoon -- Nectar Cup -- Nectar Straw -- Thin Teaspoon -- Thin Cup -- Thin Straw -- Puree -- Mechanical Soft -- Regular -- Multi-consistency -- Pill -- Cervical Esophageal Comment -- No flowsheet data found. DeBlois, Katherene Ponto 05/05/2017, 3:25 PM              Dg Swallowing Func-speech Pathology  Result Date: 04/28/2017 Objective Swallowing Evaluation: Type of Study: MBS-Modified Barium Swallow Study Patient Details Name: Issaic Welliver MRN: [id number] Date of Birth: 01-16-1961 Today's Date: 04/28/2017 Time: SLP Start Time (ACUTE ONLY): 0939-SLP Stop Time (ACUTE ONLY): 1003 SLP Time Calculation (min) (ACUTE ONLY): 24 min Past Medical History: No past medical history on file. Past Surgical History: Past Surgical History: Procedure Laterality Date . CRANIECTOMY Right 04/01/2017  Procedure: RIGHT DECOMPRESSIVE CRANIECTOMY;  Surgeon: Ditty, Kevan Ny, MD;  Location: [place];  Service: Neurosurgery;  Laterality: Right; . ESOPHAGOGASTRODUODENOSCOPY N/A 04/13/2017  Procedure: ESOPHAGOGASTRODUODENOSCOPY (EGD);  Surgeon: Georganna Skeans, MD;  Location: [place];  Service: General;  Laterality: N/A;  bedside . PEG PLACEMENT N/A 04/13/2017  Procedure: PERCUTANEOUS ENDOSCOPIC GASTROSTOMY (PEG) PLACEMENT;  Surgeon: Georganna Skeans, MD;  Location: [facility] ENDOSCOPY;  Service: General;  Laterality: N/A; HPI: 56 yo male brought to [facility] with altered mental status from DKA and sepsis. Found to have RT MCA CVA and transfer to [facility]. Required Rt decompressive  craniectomy and remained on vent post op.ETT 8/23-9/5, s/p tracheostomy 9/5.  Subjective: pt eager for POs Assessment / Plan / Recommendation CHL IP CLINICAL IMPRESSIONS 04/28/2017 Clinical Impression Pt has a severe oral and moderate pharyngeal dysphagia. Significant oral holding and anteropr/posterior rocking is observed, especially with thicker textures. Most of the puree was orally suctioned after not being able to initiate a/p transfer given Max cues, significant extra time, and small amounts of honey thick liquids as a liquid wash. Small amounts had already fallen posteriorly to the pharynx without a swallow triggered. He moved thin and nectar thick liquids more readily into his pharynx, but a delay in swallow and incomplete glottal closure allowed for silent aspiration before/druing the swallow.  Pt will not cough to command because it makes his head hurt. Small spoonfuls of thin liquid, even after pooling in his pyriform sinuses, did not enter the airway. Recommend allowing a few ice chips, given one at a time by staff, following oral care to provide pt comfort and increase use of swallowing musculature. Would not leave patient with a cup of ice as he would be impulsive with his intake and has trouble consistently initiating a swallow. SLP will continue to follow. SLP Visit Diagnosis Dysphagia, oropharyngeal phase (R13.12) Attention and concentration deficit following -- Frontal lobe and executive function deficit following -- Impact on safety and function Moderate aspiration risk;Severe aspiration risk;Risk for inadequate nutrition/hydration   [facility] IP TREATMENT RECOMMENDATION 04/28/2017 Treatment Recommendations Therapy as outlined in treatment plan below   Prognosis 04/28/2017 Prognosis for Safe Diet Advancement Good Barriers to Reach Goals Severity of deficits Barriers/Prognosis Comment -- [facility] IP DIET RECOMMENDATION 04/28/2017 SLP Diet Recommendations NPO;Ice chips PRN after oral care Liquid Administration via  -- Medication Administration Via alternative means Compensations -- Postural Changes --   [facility] IP OTHER RECOMMENDATIONS 04/28/2017 Recommended Consults -- Oral Care Recommendations Oral care  QID;Other (Comment) Other Recommendations Have oral suction available   [facility] IP FOLLOW UP RECOMMENDATIONS 04/28/2017 Follow up Recommendations Skilled Nursing facility   [facility] IP FREQUENCY AND DURATION 04/28/2017 Speech Therapy Frequency (ACUTE ONLY) min 2x/week Treatment Duration 2 weeks      [facility] IP ORAL PHASE 04/28/2017 Oral Phase Impaired Oral - Pudding Teaspoon -- Oral - Pudding Cup -- Oral - Honey Teaspoon Left anterior bolus loss;Weak lingual manipulation;Reduced posterior propulsion;Holding of bolus;Lingual/palatal residue;Delayed oral transit;Decreased bolus cohesion Oral - Honey Cup -- Oral - Nectar Teaspoon -- Oral - Nectar Cup -- Oral - Nectar Straw Left anterior bolus loss;Weak lingual manipulation;Reduced posterior propulsion;Holding of bolus;Lingual/palatal residue;Delayed oral transit;Decreased bolus cohesion Oral - Thin Teaspoon Left anterior bolus loss;Weak lingual manipulation;Reduced posterior propulsion;Holding of bolus;Lingual/palatal residue;Delayed oral transit;Decreased bolus cohesion Oral - Thin Cup -- Oral - Thin Straw Left anterior bolus loss;Weak lingual manipulation;Reduced posterior propulsion;Holding of bolus;Lingual/palatal residue;Delayed oral transit;Decreased bolus cohesion Oral - Puree Left anterior bolus loss;Weak lingual manipulation;Reduced posterior propulsion;Holding of bolus;Lingual/palatal residue;Delayed oral transit;Decreased bolus cohesion Oral - Mech Soft -- Oral - Regular -- Oral - Multi-Consistency -- Oral - Pill -- Oral Phase - Comment --  [facility] IP PHARYNGEAL PHASE 04/28/2017 Pharyngeal Phase Impaired Pharyngeal- Pudding Teaspoon -- Pharyngeal -- Pharyngeal- Pudding Cup -- Pharyngeal -- Pharyngeal- Honey Teaspoon Delayed swallow initiation-pyriform sinuses;Reduced airway/laryngeal closure  Pharyngeal -- Pharyngeal- Honey Cup -- Pharyngeal -- Pharyngeal- Nectar Teaspoon -- Pharyngeal -- Pharyngeal- Nectar Cup -- Pharyngeal -- Pharyngeal- Nectar Straw Delayed swallow initiation-pyriform sinuses;Reduced airway/laryngeal closure;Penetration/Aspiration during swallow Pharyngeal Material enters airway, passes BELOW cords without attempt by patient to eject out (silent aspiration) Pharyngeal- Thin Teaspoon Delayed swallow initiation-pyriform sinuses;Reduced airway/laryngeal closure Pharyngeal -- Pharyngeal- Thin Cup -- Pharyngeal -- Pharyngeal- Thin Straw Delayed swallow initiation-pyriform sinuses;Reduced airway/laryngeal closure;Penetration/Aspiration before swallow Pharyngeal Material enters airway, passes BELOW cords without attempt by patient to eject out (silent aspiration) Pharyngeal- Puree Delayed swallow initiation-pyriform sinuses;Reduced airway/laryngeal closure Pharyngeal -- Pharyngeal- Mechanical Soft -- Pharyngeal -- Pharyngeal- Regular -- Pharyngeal -- Pharyngeal- Multi-consistency -- Pharyngeal -- Pharyngeal- Pill -- Pharyngeal -- Pharyngeal Comment --  [facility] IP CERVICAL ESOPHAGEAL PHASE 04/28/2017 Cervical Esophageal Phase WFL Pudding Teaspoon -- Pudding Cup -- Honey Teaspoon -- Honey Cup -- Nectar Teaspoon -- Nectar Cup -- Nectar Straw -- Thin Teaspoon -- Thin Cup -- Thin Straw -- Puree -- Mechanical Soft -- Regular -- Multi-consistency -- Pill -- Cervical Esophageal Comment -- No flowsheet data found. [name] 04/28/2017, 11:05 AM  [name], M.A. CCC-SLP [phone]              Lab Data:  CBC:  Recent Labs Lab 05/13/17 0524 05/15/17 0507 05/17/17 0320  WBC 14.7* 13.0* 14.7*  HGB 12.1* 11.9* 12.3*  HCT 40.3 40.0 40.9  MCV 92.4 93.5 92.7  PLT 251 226 546   Basic Metabolic Panel:  Recent Labs Lab 05/15/17 0507 05/17/17 0320  NA 137 137  K 4.5 4.4  CL 102 99*  CO2 27 25  GLUCOSE 142* 109*  BUN 28* 29*  CREATININE 0.75 0.75  CALCIUM 10.6* 10.9*    GFR: Estimated Creatinine Clearance: 106.5 mL/min (by C-G formula based on SCr of 0.75 mg/dL). Liver Function Tests: No results for input(s): AST, ALT, ALKPHOS, BILITOT, PROT, ALBUMIN in the last 168 hours. No results for input(s): LIPASE, AMYLASE in the last 168 hours. No results for input(s): AMMONIA in the last 168 hours. Coagulation Profile: No results for input(s): INR, PROTIME in the last 168 hours. Cardiac Enzymes: No results for input(s):  CKTOTAL, CKMB, CKMBINDEX, TROPONINI in the last 168 hours. BNP (last 3 results) No results for input(s): PROBNP in the last 8760 hours. HbA1C: No results for input(s): HGBA1C in the last 72 hours. CBG:  Recent Labs Lab 05/17/17 1752 05/17/17 2150 05/18/17 0620 05/18/17 1125 05/18/17 1635  GLUCAP 122* 124* 131* 130* 104*   Lipid Profile: No results for input(s): CHOL, HDL, LDLCALC, TRIG, CHOLHDL, LDLDIRECT in the last 72 hours. Thyroid Function Tests: No results for input(s): TSH, T4TOTAL, FREET4, T3FREE, THYROIDAB in the last 72 hours. Anemia Panel: No results for input(s): VITAMINB12, FOLATE, FERRITIN, TIBC, IRON, RETICCTPCT in the last 72 hours. Urine analysis:    Component Value Date/Time   COLORURINE YELLOW 05/02/2017 1353   APPEARANCEUR CLOUDY (A) 05/02/2017 1353   LABSPEC 1.017 05/02/2017 1353   PHURINE 7.0 05/02/2017 1353   GLUCOSEU NEGATIVE 05/02/2017 1353   HGBUR NEGATIVE 05/02/2017 1353   BILIRUBINUR NEGATIVE 05/02/2017 1353   KETONESUR NEGATIVE 05/02/2017 1353   PROTEINUR 100 (A) 05/02/2017 1353   NITRITE NEGATIVE 05/02/2017 1353   LEUKOCYTESUR NEGATIVE 05/02/2017 1353     Alta Shober M.D. [facility] 05/19/2017, 11:48 AM  Pager: [phone] Between 7am to 7pm - call Pager - [phone]  After 7pm go to [email or web address] - password [email or web address]  Call night coverage person covering after 7pm

## 2017-05-20 LAB — CBC
HEMATOCRIT: 46.2 % (ref 39.0–52.0)
Hemoglobin: 13.9 g/dL (ref 13.0–17.0)
MCH: 27.9 pg (ref 26.0–34.0)
MCHC: 30.1 g/dL (ref 30.0–36.0)
MCV: 92.6 fL (ref 78.0–100.0)
Platelets: 239 10*3/uL (ref 150–400)
RBC: 4.99 MIL/uL (ref 4.22–5.81)
RDW: 16.1 % — AB (ref 11.5–15.5)
WBC: 15.2 10*3/uL — ABNORMAL HIGH (ref 4.0–10.5)

## 2017-05-20 LAB — GLUCOSE, CAPILLARY
GLUCOSE-CAPILLARY: 111 mg/dL — AB (ref 65–99)
GLUCOSE-CAPILLARY: 118 mg/dL — AB (ref 65–99)
Glucose-Capillary: 164 mg/dL — ABNORMAL HIGH (ref 65–99)
Glucose-Capillary: 186 mg/dL — ABNORMAL HIGH (ref 65–99)

## 2017-05-20 LAB — BASIC METABOLIC PANEL
Anion gap: 9 (ref 5–15)
BUN: 29 mg/dL — ABNORMAL HIGH (ref 6–20)
CALCIUM: 11.1 mg/dL — AB (ref 8.9–10.3)
CO2: 26 mmol/L (ref 22–32)
CREATININE: 0.65 mg/dL (ref 0.61–1.24)
Chloride: 102 mmol/L (ref 101–111)
GFR calc Af Amer: >60 mL/min (ref >60)
GFR calc non Af Amer: >60 mL/min (ref >60)
GLUCOSE: 127 mg/dL — AB (ref 65–99)
Potassium: 4.6 mmol/L (ref 3.5–5.1)
Sodium: 137 mmol/L (ref 135–145)

## 2017-05-20 NOTE — Progress Notes (Deleted)
Lab just called with critical value for patient high blood sugar of 502 gave her 10 units of novo log after the blood was drawn, will notify attending.

## 2017-05-20 NOTE — Progress Notes (Signed)
[facility]                                                                              Patient Demographics  Joe Wilkinson, is a 56 y.o. male, DOB - 12-03-1960, OHY:[id number]  Admit date - 03/31/2017   Admitting Physician Kerney Elbe, MD  Outpatient Primary MD for the patient is Patient, No Pcp Per  Outpatient specialists:   LOS - 49  days   Medical records reviewed and are as summarized below:    No chief complaint on file.      Brief summary  HPI on 04/01/2017 by Dr. 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 He was transferred to [facility] ICU for NeuroSx care, seen by neurosurgery and Neuro with plans for craniotomy. He was intubated and sedated and unable to provide any HPI, ROS, PMH, PSH, FH, home meds, allergies. Interim history  He was transferred to the ICU and underwent decompressive right craniectomy 8/25. Hypertonic saline protocol was completed. He required transfusion support and was treated for AFib with RVR, continued on the ventilator, eventually receiving trach 9/5 and PEG 9/6, on ATC 9/7. Patient does not have health insurance and because of his craniectomy without the ability to fit a helmet, most skilled nursing facilities areunwilling to accept him. Currently he is getting meds through PEG and continuous TF. Heparin was converted to prophylaxis Lovenox. [facility] service on 9/25. Currently awaiting placement to snf.   Assessment & Plan   Massive right MCA territory infarct, PCA watershed infarct- acute CVA - the patient was found unresponsive, hemiparesis and was found to have acute right MCA CVA - Neurosurgery, neurology consulted, post craniotomy, cytotoxic edema requiring decompression with craniotomy, hypersaline treatment - Continue trach, PEG for dysphagia  -  Neurosurgery follow-up in 2 months. - LDL 38, hemoglobin A1c 14.3 - Repeat CT head showed further swelling in the region of complete right MCA territory infarction with more extensive bulging of the brain through the craniectomy defect. No midline shift. At superior margin there could be some extension of the swollen brain through dural defect. At the superior margin, small amount of petechial bleeding within the region of infarction - awaiting skilled nursing facility, medically stable   Acute respiratory failure with hypoxia and hypercarbia - the patient was intubated at the time of admission, status post trach - Continue pulmonary hygiene, followed by PCCM - per PCCM on 10/9, #4 cuffless Shiley change to another #4 cuffless   Septic shock secondary to S agalactiae pneumonia - resolved, completed antibiotics on 10/8   New-onset atrial fibrillation with RVR - currently normal sinus rhythm, [place] vasc 4 - patient was not placed on an decortication due to anemia requiring 4 units packed RBCs. Hemoglobin has remained stable, started on Eliquis by neurology   Blood loss anemia- acute on chronic - unclear source,FOBT negative - Hemoglobin has remained stable in the past several weeks, follow CBC   Escherichia coli, Klebsiella UTI - completed Keflex on  05/15/17 for 10 days  Nutrition, dysphagia - continue tube feeds, also placed on dysphagia 2 diet.  Perianal cellulitis with possible abscess - Gen. Surgery was consulted,was placed on clindamycin, completed    Code Status:full CODE STATUS DVT Prophylaxis:  eliquis  Family Communication: no family at the bedside  Disposition Plan: medically stable, awaiting skilled nursing facility  Time Spent in minutes   15 minutes  Procedures:  -LP 8/24 >> glucose 205, RBC 16, protein 62, WBC 5 -CT head 8/24 >> Large Rt MCA -8/25 decompressive craniectomy -EEG 8/27 >> bilateral cerebral dysfunction, non-specific in etiology -Echo 8/27 >> EF  65 to 70% -CT abd/pelvis 8/30 >> third spacing, 3.5 cm low attenuation Lt hepatic lobe -CT Head 9/3>> Severe right hemisphere cytotoxic edema with stable extension of edematous brain through the right craniectomy defect. Stable petechial hemorrhage without malignant hemorrhagicTransformation. Stable intracranial mass effect with leftward midline shift of 7 mm. Small left PCA territory infarct. -9/5 Trach placement.  -9/6 PEG tube placement.  -CXR 9/23 > LLL atx and small effusion -Barrium swallow 9/21 > severe oral and mod pharyngeal dysphagia; mod to severe aspiration risk -CT head on 10/1 shows Further swelling in the region of the complete right MCA territory infarction with more extensive bulging of the brain through the craniectomy defect. No midline shift.   Consultants:   PCCM Neurology Neurosurgery General surgery   Antimicrobials:      Medications  Scheduled Meds: . acetaminophen (TYLENOL) oral liquid 160 mg/5 mL  650 mg Per Tube TID  . apixaban  5 mg Per Tube BID  . chlorhexidine  15 mL Mouth Rinse BID  . feeding supplement (PRO-STAT SUGAR FREE 64)  30 mL Per Tube BID  . free water  250 mL Per Tube Q8H  . gabapentin  300 mg Oral QHS  . glycopyrrolate  1 mg Per Tube BID  . insulin aspart  0-20 Units Subcutaneous TID WC  . insulin aspart  0-5 Units Subcutaneous QHS  . insulin detemir  34 Units Subcutaneous Daily  . metoprolol tartrate  12.5 mg Per Tube BID  . pantoprazole sodium  40 mg Per Tube Daily   Continuous Infusions: . feeding supplement (GLUCERNA 1.2 CAL) 1,000 mL (05/20/17 0240)   PRN Meds:.bisacodyl, docusate, ibuprofen, ipratropium-albuterol, [DISCONTINUED] ondansetron **OR** ondansetron (ZOFRAN) IV, promethazine   Antibiotics   Anti-infectives    Start     Dose/Rate Route Frequency Ordered Stop   05/05/17 2200  cephALEXin (KEFLEX) capsule 500 mg     500 mg Oral Every 12 hours 05/05/17 1640 05/15/17 1042   04/26/17 0600  vancomycin (VANCOCIN) IVPB  750 mg/150 ml premix  Status:  Discontinued     750 mg 150 mL/hr over 60 Minutes Intravenous Every 12 hours 04/25/17 1729 04/25/17 1730   04/25/17 1830  clindamycin (CLEOCIN) capsule 300 mg     300 mg Oral Every 6 hours 04/25/17 1730 04/29/17 1717   04/25/17 1800  vancomycin (VANCOCIN) IVPB 750 mg/150 ml premix  Status:  Discontinued     750 mg 150 mL/hr over 60 Minutes Intravenous Every 12 hours 04/25/17 1728 04/25/17 1729   04/23/17 0945  piperacillin-tazobactam (ZOSYN) IVPB 3.375 g  Status:  Discontinued     3.375 g 12.5 mL/hr over 240 Minutes Intravenous Every 8 hours 04/23/17 0930 04/25/17 1730   04/23/17 0430  vancomycin (VANCOCIN) IVPB 1000 mg/200 mL premix  Status:  Discontinued     1,000 mg 200 mL/hr over 60 Minutes Intravenous Every 12 hours  04/22/17 1655 04/25/17 1728   04/22/17 1630  vancomycin (VANCOCIN) IVPB 1000 mg/200 mL premix     1,000 mg 200 mL/hr over 60 Minutes Intravenous  Once 04/22/17 1627 04/22/17 1826   04/08/17 0200  piperacillin-tazobactam (ZOSYN) IVPB 3.375 g  Status:  Discontinued     3.375 g 12.5 mL/hr over 240 Minutes Intravenous Every 8 hours 04/08/17 0058 04/15/17 1601   04/08/17 0100  piperacillin-tazobactam (ZOSYN) IVPB 3.375 g  Status:  Discontinued     3.375 g 100 mL/hr over 30 Minutes Intravenous Every 6 hours 04/08/17 0054 04/08/17 0057   04/03/17 1200  ampicillin (OMNIPEN) 2 g in sodium chloride 0.9 % 50 mL IVPB     2 g 150 mL/hr over 20 Minutes Intravenous Every 6 hours 04/03/17 1005 04/06/17 1837   04/01/17 1800  vancomycin (VANCOCIN) IVPB 1000 mg/200 mL premix  Status:  Discontinued     1,000 mg 200 mL/hr over 60 Minutes Intravenous Every 24 hours 04/01/17 0146 04/01/17 0154   04/01/17 1600  ceFAZolin (ANCEF) IVPB 2g/100 mL premix     2 g 200 mL/hr over 30 Minutes Intravenous Every 8 hours 04/01/17 1100 04/02/17 0056   04/01/17 0828  bacitracin 50,000 Units in sodium chloride irrigation 0.9 % 500 mL irrigation  Status:  Discontinued        As needed 04/01/17 0837 04/01/17 0947   04/01/17 0600  piperacillin-tazobactam (ZOSYN) IVPB 3.375 g  Status:  Discontinued     3.375 g 12.5 mL/hr over 240 Minutes Intravenous Every 8 hours 04/01/17 0146 04/01/17 0154   04/01/17 0600  vancomycin (VANCOCIN) IVPB 750 mg/150 ml premix  Status:  Discontinued     750 mg 150 mL/hr over 60 Minutes Intravenous Every 12 hours 04/01/17 0225 04/03/17 0959   04/01/17 0400  piperacillin-tazobactam (ZOSYN) IVPB 3.375 g  Status:  Discontinued     3.375 g 12.5 mL/hr over 240 Minutes Intravenous Every 8 hours 04/01/17 0225 04/03/17 0959        Subjective:   Joe Wilkinson was seen and examined today. No fevers, no complaints. No acute issues overnight.  Patient denies dizziness, chest pain, shortness of breath, abdominal pain, N/V/D/C, new weakness, numbess, tingling.   Objective:   Vitals:   05/20/17 0316 05/20/17 0554 05/20/17 0916 05/20/17 0924  BP:  121/84 134/87   Pulse: 85 98 97 94  Resp: 17 18 20 18   Temp:  97.6 F (36.4 C) 98.2 F (36.8 C)   TempSrc:  Axillary Oral   SpO2: 98% 98% 100% 96%  Weight:      Height:        Intake/Output Summary (Last 24 hours) at 05/20/17 1144 Last data filed at 05/20/17 0932  Gross per 24 hour  Intake              120 ml  Output             1000 ml  Net             -880 ml     Wt Readings from Last 3 Encounters:  05/12/17 83.7 kg (184 lb 9.6 oz)  03/30/17 113.4 kg (250 lb)     Exam    General: Alert and oriented x 3, NAD  Eyes:   HEENT:  trach  Cardiovascular: S1 S2 clear, RRR   Respiratory: CTAB  Gastrointestinal: Soft, nontender, nondistended, + bowel sounds, PEG +  Ext: no pedal edema bilaterally  Neuro: no new deficits  Musculoskeletal: No digital cyanosis,  clubbing  Skin: No rashes  Psych: Normal affect and demeanor, alert and oriented x3    Data Reviewed:  I have personally reviewed following labs and imaging studies  Micro Results No results found for this or  any previous visit (from the past 240 hour(s)).  Radiology Reports Ct Head Wo Contrast  Result Date: 05/08/2017 CLINICAL DATA:  Followup craniectomy.  Headache. EXAM: CT HEAD WITHOUT CONTRAST TECHNIQUE: Contiguous axial images were obtained from the base of the skull through the vertex without intravenous contrast. COMPARISON:  04/10/2017 and multiple previous FINDINGS: Brain: Massive swelling in the region of the complete right MCA territory infarction with more protrusion of the infarcted brain tissue through the craniectomy defect. Mild petechial bleeding within the superior portion of the infarction but without frank hematoma. No new vascular territory involvement. No midline shift because of the decompressive craniectomy. No ventricular trapping. No extra-axial collection. Along the upper region of the craniectomy, I wonder if there could actually be a dural defect through which some of the swollen brain has extended. Vascular: No other vascular finding. Skull: No unexpected finding related to the craniectomy. Sinuses/Orbits: No significant sinus finding. Old blowout fracture on the right. Other: None significant IMPRESSION: Further swelling in the region of the complete right MCA territory infarction with more extensive bulging of the brain through the craniectomy defect. No midline shift however, because of the decompressive craniectomy. At the superior margin, there could be some extension of the swollen brain through a dural defect. Also at the superior margin, there is a small amount of petechial bleeding within the region of infarction. Electronically Signed   By: Nelson Chimes M.D.   On: 05/08/2017 14:12   Ct Abdomen Pelvis W Contrast  Result Date: 04/22/2017 CLINICAL DATA:  Perianal abscess. Status post stroke and complicated recovery. EXAM: CT ABDOMEN AND PELVIS WITH CONTRAST TECHNIQUE: Multidetector CT imaging of the abdomen and pelvis was performed using the standard protocol following bolus  administration of intravenous contrast. CONTRAST:  193mL ISOVUE-300 IOPAMIDOL (ISOVUE-300) INJECTION 61% COMPARISON:  CT abdomen and pelvis April 06, 2017 FINDINGS: LOWER CHEST: Bilateral lower lobe atelectasis. Heart size is upper limits of normal. No pericardial effusion. Included heart size is normal. No pericardial effusion. HEPATOBILIARY: 2.5 cm heterogeneously enhancing mass LEFT lobe of the liver with centripetal puddling. Liver is otherwise unremarkable. Status post cholecystectomy. PANCREAS: Normal. SPLEEN: Normal. ADRENALS/URINARY TRACT: Kidneys are orthotopic, demonstrating symmetric enhancement. No nephrolithiasis, hydronephrosis or solid renal masses. Too small to characterize hypodensities in the kidneys bilaterally. The unopacified ureters are normal in course and caliber. Delayed imaging through the kidneys demonstrates symmetric prompt contrast excretion within the proximal urinary collecting system. Urinary bladder is partially distended and unremarkable. Normal adrenal glands. STOMACH/BOWEL: The stomach, small and large bowel are normal in course and caliber without inflammatory changes, sensitivity decreased without oral contrast. Intraluminal gastrostomy retaining bulb. Moderate colonic diverticulosis. Normal appendix. VASCULAR/LYMPHATIC: Aortoiliac vessels are normal in course and caliber. Mild calcific atherosclerosis. No lymphadenopathy by CT size criteria. Prominent inguinal lymph nodes are likely reactive. REPRODUCTIVE: Normal. OTHER: No intraperitoneal free fluid or free air. MUSCULOSKELETAL: Perianal soft tissue stranding and soft tissue density without focal fluid collection, subcutaneous gas or radiopaque foreign bodies inferior extent of process incompletely imaged. No intraperitoneal extension. Small bilateral fat containing inguinal hernias. Small fat containing umbilical hernia. Severe LEFT L5-S1 facet arthropathy. IMPRESSION: 1. Perianal cellulitis without drainable fluid  collection ; inferior extent of process not imaged. 2. No acute intra-abdominal or pelvic process. 3. 2.5  cm hepatic hemangioma. Aortic Atherosclerosis (ICD10-I70.0). Electronically Signed   By: Elon Alas M.D.   On: 04/22/2017 23:37   Dg Chest Port 1 View  Result Date: 05/13/2017 CLINICAL DATA:  Leukocytosis. EXAM: PORTABLE CHEST 1 VIEW COMPARISON:  05/05/2017 FINDINGS: Tracheostomy remains in place. There is mild atelectasis at both lung bases. No consolidation or lobar collapse. Upper lungs are clear. No heart failure or effusion. IMPRESSION: Mild bibasilar atelectasis. Electronically Signed   By: Nelson Chimes M.D.   On: 05/13/2017 08:55   Dg Chest Port 1 View  Result Date: 05/05/2017 CLINICAL DATA:  Neck pain EXAM: PORTABLE CHEST 1 VIEW COMPARISON:  Chest radiograph 04/30/2017 FINDINGS: Tracheostomy tube tip is at the level of the clavicular heads. Cardiomediastinal contours are unchanged. No focal airspace consolidation or pulmonary edema. No pneumothorax. Unchanged probable left pleural effusion and associated atelectasis. IMPRESSION: Unchanged small left pleural effusion and associated atelectasis. Electronically Signed   By: Ulyses Jarred M.D.   On: 05/05/2017 20:58   Dg Chest Port 1 View  Result Date: 04/30/2017 CLINICAL DATA:  Respiratory failure EXAM: PORTABLE CHEST 1 VIEW COMPARISON:  04/24/2017 FINDINGS: Left lower lobe airspace disease has progressed in the interval. Small left effusion. Right lung clear. Tracheostomy remains in good position IMPRESSION: Progression of mild left lower lobe atelectasis/ infiltrate and small left effusion. Electronically Signed   By: Franchot Gallo M.D.   On: 04/30/2017 15:20   Dg Chest Port 1 View  Result Date: 04/24/2017 CLINICAL DATA:  Replacement of tracheostomy tube today. EXAM: PORTABLE CHEST 1 VIEW COMPARISON:  Single-view of the chest 04/22/2017. FINDINGS: Tracheostomy tube is in place with the tip in good position at the level of the  clavicular heads. Lungs are clear. Heart size is normal. No pneumothorax or pleural effusion. No acute abnormality. IMPRESSION: Tracheostomy tube in good position.  No acute disease. Electronically Signed   By: Inge Rise M.D.   On: 04/24/2017 12:20   Dg Chest Port 1 View  Result Date: 04/22/2017 CLINICAL DATA:  Followup respiratory failure. EXAM: PORTABLE CHEST 1 VIEW COMPARISON:  04/18/2017 FINDINGS: Tracheostomy remains in place. Upper lungs are clear. Mild volume loss in both lower lobes. No worsening or new findings. IMPRESSION: Persistent mild volume loss in both lower lobes. Electronically Signed   By: Nelson Chimes M.D.   On: 04/22/2017 08:25   Dg Abd 2 Views  Result Date: 05/06/2017 CLINICAL DATA:  Abdominal distension. EXAM: ABDOMEN - 2 VIEW COMPARISON:  May 02, 2017. FINDINGS: No abnormal bowel dilatation is noted. Gastrostomy tube is seen projected over gastric air bubble. Residual contrast is seen in the colon. No abnormal calcifications are noted. IMPRESSION: No evidence of bowel obstruction or ileus. Electronically Signed   By: Marijo Conception, M.D.   On: 05/06/2017 21:02   Dg Abd Portable 1v  Result Date: 05/02/2017 CLINICAL DATA:  Abdomen pain EXAM: PORTABLE ABDOMEN - 1 VIEW COMPARISON:  None. FINDINGS: The bowel gas pattern is normal. No radio-opaque calculi or other significant radiographic abnormality are seen. Surgical clips are projected over the right pelvis. IMPRESSION: No acute abnormality. Electronically Signed   By: Abelardo Diesel M.D.   On: 05/02/2017 15:33   Dg Swallowing Func-speech Pathology  Result Date: 05/05/2017 Objective Swallowing Evaluation: Type of Study: MBS-Modified Barium Swallow Study Patient Details Name: Brand Siever MRN: [id number] Date of Birth: 1961/08/08 Today's Date: 05/05/2017 Time: SLP Start Time (ACUTE ONLY): 1350-SLP Stop Time (ACUTE ONLY): 1410 SLP Time Calculation (min) (ACUTE ONLY): 20 min Past  Medical History: No past medical history  on file. Past Surgical History: Past Surgical History: Procedure Laterality Date . CRANIECTOMY Right 04/01/2017  Procedure: RIGHT DECOMPRESSIVE CRANIECTOMY;  Surgeon: Ditty, Kevan Ny, MD;  Location: [place];  Service: Neurosurgery;  Laterality: Right; . ESOPHAGOGASTRODUODENOSCOPY N/A 04/13/2017  Procedure: ESOPHAGOGASTRODUODENOSCOPY (EGD);  Surgeon: Georganna Skeans, MD;  Location: [place];  Service: General;  Laterality: N/A;  bedside . PEG PLACEMENT N/A 04/13/2017  Procedure: PERCUTANEOUS ENDOSCOPIC GASTROSTOMY (PEG) PLACEMENT;  Surgeon: Georganna Skeans, MD;  Location: [facility] ENDOSCOPY;  Service: General;  Laterality: N/A; HPI: 57 yo male brought to [facility] with altered mental status from DKA and sepsis. Found to have RT MCA CVA and transfer to [facility]. Required Rt decompressive craniectomy and remained on vent post op.ETT 8/23-9/5, s/p tracheostomy 9/5.  Subjective: pt eager for POs Assessment / Plan / Recommendation CHL IP CLINICAL IMPRESSIONS 05/05/2017 Clinical Impression  Pt demonstrates cognitive impairment impacting swallow function. Prolonged liangual rocking and pumping occurs with most boluses, particulalry purees, though a liquid wash is beneficial to facilitate oral transit. Swallow response is delayed reuslting in flash penetration events. Otherwise oropharyngeal mechanism is WNL. Pt may initaite a puree/thin diet though intake may be poor and assistance will be needed for attention with feeding and to avoid prolonged oral holding. PMSV to be in place with all PO. WIll f/u for tolerance.  SLP Visit Diagnosis Dysphagia, oropharyngeal phase (R13.12) Attention and concentration deficit following -- Frontal lobe and executive function deficit following -- Impact on safety and function --   CHL IP TREATMENT RECOMMENDATION 05/05/2017 Treatment Recommendations Therapy as outlined in treatment plan below   Prognosis 05/05/2017 Prognosis for Safe Diet Advancement Good Barriers to Reach Goals -- Barriers/Prognosis  Comment -- CHL IP DIET RECOMMENDATION 05/05/2017 SLP Diet Recommendations Thin liquid;Dysphagia 1 (Puree) solids Liquid Administration via Cup;Straw Medication Administration Crushed with puree Compensations Minimize environmental distractions;Follow solids with liquid Postural Changes Seated upright at 90 degrees   CHL IP OTHER RECOMMENDATIONS 05/05/2017 Recommended Consults -- Oral Care Recommendations Other (Comment);Oral care BID Other Recommendations Have oral suction available;Place PMSV during PO intake   CHL IP FOLLOW UP RECOMMENDATIONS 05/05/2017 Follow up Recommendations Skilled Nursing facility   [facility] IP FREQUENCY AND DURATION 05/05/2017 Speech Therapy Frequency (ACUTE ONLY) min 2x/week Treatment Duration 2 weeks      CHL IP ORAL PHASE 05/05/2017 Oral Phase Impaired Oral - Pudding Teaspoon -- Oral - Pudding Cup -- Oral - Honey Teaspoon NT Oral - Honey Cup -- Oral - Nectar Teaspoon -- Oral - Nectar Cup -- Oral - Nectar Straw NT Oral - Thin Teaspoon NT Oral - Thin Cup -- Oral - Thin Straw Holding of bolus;Lingual pumping Oral - Puree Lingual pumping;Holding of bolus Oral - Mech Soft -- Oral - Regular Lingual pumping;Holding of bolus Oral - Multi-Consistency -- Oral - Pill -- Oral Phase - Comment --  CHL IP PHARYNGEAL PHASE 05/05/2017 Pharyngeal Phase Impaired Pharyngeal- Pudding Teaspoon -- Pharyngeal -- Pharyngeal- Pudding Cup -- Pharyngeal -- Pharyngeal- Honey Teaspoon NT Pharyngeal -- Pharyngeal- Honey Cup -- Pharyngeal -- Pharyngeal- Nectar Teaspoon -- Pharyngeal -- Pharyngeal- Nectar Cup -- Pharyngeal -- Pharyngeal- Nectar Straw NT Pharyngeal -- Pharyngeal- Thin Teaspoon NT Pharyngeal -- Pharyngeal- Thin Cup -- Pharyngeal -- Pharyngeal- Thin Straw Delayed swallow initiation-pyriform sinuses;Penetration/Aspiration during swallow Pharyngeal Material enters airway, remains ABOVE vocal cords then ejected out;Material does not enter airway Pharyngeal- Puree Delayed swallow initiation-pyriform sinuses Pharyngeal --  Pharyngeal- Mechanical Soft Delayed swallow initiation-pyriform sinuses Pharyngeal -- Pharyngeal- Regular -- Pharyngeal --  Pharyngeal- Multi-consistency -- Pharyngeal -- Pharyngeal- Pill -- Pharyngeal -- Pharyngeal Comment --  CHL IP CERVICAL ESOPHAGEAL PHASE 04/28/2017 Cervical Esophageal Phase WFL Pudding Teaspoon -- Pudding Cup -- Honey Teaspoon -- Honey Cup -- Nectar Teaspoon -- Nectar Cup -- Nectar Straw -- Thin Teaspoon -- Thin Cup -- Thin Straw -- Puree -- Mechanical Soft -- Regular -- Multi-consistency -- Pill -- Cervical Esophageal Comment -- No flowsheet data found. DeBlois, Katherene Ponto 05/05/2017, 3:25 PM              Dg Swallowing Func-speech Pathology  Result Date: 04/28/2017 Objective Swallowing Evaluation: Type of Study: MBS-Modified Barium Swallow Study Patient Details Name: Yovanny Coats MRN: [id number] Date of Birth: Dec 10, 1960 Today's Date: 04/28/2017 Time: SLP Start Time (ACUTE ONLY): 0939-SLP Stop Time (ACUTE ONLY): 1003 SLP Time Calculation (min) (ACUTE ONLY): 24 min Past Medical History: No past medical history on file. Past Surgical History: Past Surgical History: Procedure Laterality Date . CRANIECTOMY Right 04/01/2017  Procedure: RIGHT DECOMPRESSIVE CRANIECTOMY;  Surgeon: Ditty, Kevan Ny, MD;  Location: [place];  Service: Neurosurgery;  Laterality: Right; . ESOPHAGOGASTRODUODENOSCOPY N/A 04/13/2017  Procedure: ESOPHAGOGASTRODUODENOSCOPY (EGD);  Surgeon: Georganna Skeans, MD;  Location: [place];  Service: General;  Laterality: N/A;  bedside . PEG PLACEMENT N/A 04/13/2017  Procedure: PERCUTANEOUS ENDOSCOPIC GASTROSTOMY (PEG) PLACEMENT;  Surgeon: Georganna Skeans, MD;  Location: [facility] ENDOSCOPY;  Service: General;  Laterality: N/A; HPI: 55 yo male brought to [facility] with altered mental status from DKA and sepsis. Found to have RT MCA CVA and transfer to [facility]. Required Rt decompressive craniectomy and remained on vent post op.ETT 8/23-9/5, s/p tracheostomy 9/5.  Subjective: pt eager for POs  Assessment / Plan / Recommendation CHL IP CLINICAL IMPRESSIONS 04/28/2017 Clinical Impression Pt has a severe oral and moderate pharyngeal dysphagia. Significant oral holding and anteropr/posterior rocking is observed, especially with thicker textures. Most of the puree was orally suctioned after not being able to initiate a/p transfer given Max cues, significant extra time, and small amounts of honey thick liquids as a liquid wash. Small amounts had already fallen posteriorly to the pharynx without a swallow triggered. He moved thin and nectar thick liquids more readily into his pharynx, but a delay in swallow and incomplete glottal closure allowed for silent aspiration before/druing the swallow.  Pt will not cough to command because it makes his head hurt. Small spoonfuls of thin liquid, even after pooling in his pyriform sinuses, did not enter the airway. Recommend allowing a few ice chips, given one at a time by staff, following oral care to provide pt comfort and increase use of swallowing musculature. Would not leave patient with a cup of ice as he would be impulsive with his intake and has trouble consistently initiating a swallow. SLP will continue to follow. SLP Visit Diagnosis Dysphagia, oropharyngeal phase (R13.12) Attention and concentration deficit following -- Frontal lobe and executive function deficit following -- Impact on safety and function Moderate aspiration risk;Severe aspiration risk;Risk for inadequate nutrition/hydration   [facility] IP TREATMENT RECOMMENDATION 04/28/2017 Treatment Recommendations Therapy as outlined in treatment plan below   Prognosis 04/28/2017 Prognosis for Safe Diet Advancement Good Barriers to Reach Goals Severity of deficits Barriers/Prognosis Comment -- [facility] IP DIET RECOMMENDATION 04/28/2017 SLP Diet Recommendations NPO;Ice chips PRN after oral care Liquid Administration via -- Medication Administration Via alternative means Compensations -- Postural Changes --   [facility] IP OTHER  RECOMMENDATIONS 04/28/2017 Recommended Consults -- Oral Care Recommendations Oral care QID;Other (Comment) Other Recommendations Have oral suction available  [facility] IP FOLLOW UP RECOMMENDATIONS 04/28/2017 Follow up Recommendations Skilled Nursing facility   [facility] IP FREQUENCY AND DURATION 04/28/2017 Speech Therapy Frequency (ACUTE ONLY) min 2x/week Treatment Duration 2 weeks      [facility] IP ORAL PHASE 04/28/2017 Oral Phase Impaired Oral - Pudding Teaspoon -- Oral - Pudding Cup -- Oral - Honey Teaspoon Left anterior bolus loss;Weak lingual manipulation;Reduced posterior propulsion;Holding of bolus;Lingual/palatal residue;Delayed oral transit;Decreased bolus cohesion Oral - Honey Cup -- Oral - Nectar Teaspoon -- Oral - Nectar Cup -- Oral - Nectar Straw Left anterior bolus loss;Weak lingual manipulation;Reduced posterior propulsion;Holding of bolus;Lingual/palatal residue;Delayed oral transit;Decreased bolus cohesion Oral - Thin Teaspoon Left anterior bolus loss;Weak lingual manipulation;Reduced posterior propulsion;Holding of bolus;Lingual/palatal residue;Delayed oral transit;Decreased bolus cohesion Oral - Thin Cup -- Oral - Thin Straw Left anterior bolus loss;Weak lingual manipulation;Reduced posterior propulsion;Holding of bolus;Lingual/palatal residue;Delayed oral transit;Decreased bolus cohesion Oral - Puree Left anterior bolus loss;Weak lingual manipulation;Reduced posterior propulsion;Holding of bolus;Lingual/palatal residue;Delayed oral transit;Decreased bolus cohesion Oral - Mech Soft -- Oral - Regular -- Oral - Multi-Consistency -- Oral - Pill -- Oral Phase - Comment --  [facility] IP PHARYNGEAL PHASE 04/28/2017 Pharyngeal Phase Impaired Pharyngeal- Pudding Teaspoon -- Pharyngeal -- Pharyngeal- Pudding Cup -- Pharyngeal -- Pharyngeal- Honey Teaspoon Delayed swallow initiation-pyriform sinuses;Reduced airway/laryngeal closure Pharyngeal -- Pharyngeal- Honey Cup -- Pharyngeal -- Pharyngeal- Nectar Teaspoon -- Pharyngeal --  Pharyngeal- Nectar Cup -- Pharyngeal -- Pharyngeal- Nectar Straw Delayed swallow initiation-pyriform sinuses;Reduced airway/laryngeal closure;Penetration/Aspiration during swallow Pharyngeal Material enters airway, passes BELOW cords without attempt by patient to eject out (silent aspiration) Pharyngeal- Thin Teaspoon Delayed swallow initiation-pyriform sinuses;Reduced airway/laryngeal closure Pharyngeal -- Pharyngeal- Thin Cup -- Pharyngeal -- Pharyngeal- Thin Straw Delayed swallow initiation-pyriform sinuses;Reduced airway/laryngeal closure;Penetration/Aspiration before swallow Pharyngeal Material enters airway, passes BELOW cords without attempt by patient to eject out (silent aspiration) Pharyngeal- Puree Delayed swallow initiation-pyriform sinuses;Reduced airway/laryngeal closure Pharyngeal -- Pharyngeal- Mechanical Soft -- Pharyngeal -- Pharyngeal- Regular -- Pharyngeal -- Pharyngeal- Multi-consistency -- Pharyngeal -- Pharyngeal- Pill -- Pharyngeal -- Pharyngeal Comment --  [facility] IP CERVICAL ESOPHAGEAL PHASE 04/28/2017 Cervical Esophageal Phase WFL Pudding Teaspoon -- Pudding Cup -- Honey Teaspoon -- Honey Cup -- Nectar Teaspoon -- Nectar Cup -- Nectar Straw -- Thin Teaspoon -- Thin Cup -- Thin Straw -- Puree -- Mechanical Soft -- Regular -- Multi-consistency -- Pill -- Cervical Esophageal Comment -- No flowsheet data found. [name] 04/28/2017, 11:05 AM  [name], M.A. CCC-SLP [phone]              Lab Data:  CBC:  Recent Labs Lab 05/15/17 0507 05/17/17 0320 05/20/17 0340  WBC 13.0* 14.7* 15.2*  HGB 11.9* 12.3* 13.9  HCT 40.0 40.9 46.2  MCV 93.5 92.7 92.6  PLT 226 245 568   Basic Metabolic Panel:  Recent Labs Lab 05/15/17 0507 05/17/17 0320 05/20/17 0340  NA 137 137 137  K 4.5 4.4 4.6  CL 102 99* 102  CO2 27 25 26   GLUCOSE 142* 109* 127*  BUN 28* 29* 29*  CREATININE 0.75 0.75 0.65  CALCIUM 10.6* 10.9* 11.1*   GFR: Estimated Creatinine Clearance: 106.5  mL/min (by C-G formula based on SCr of 0.65 mg/dL). Liver Function Tests: No results for input(s): AST, ALT, ALKPHOS, BILITOT, PROT, ALBUMIN in the last 168 hours. No results for input(s): LIPASE, AMYLASE in the last 168 hours. No results for input(s): AMMONIA in the last 168 hours. Coagulation Profile: No results for input(s): INR, PROTIME in the last 168 hours. Cardiac Enzymes: No results for input(s):  CKTOTAL, CKMB, CKMBINDEX, TROPONINI in the last 168 hours. BNP (last 3 results) No results for input(s): PROBNP in the last 8760 hours. HbA1C: No results for input(s): HGBA1C in the last 72 hours. CBG:  Recent Labs Lab 05/19/17 1125 05/19/17 1837 05/19/17 2219 05/20/17 0624 05/20/17 1125  GLUCAP 155* 143* 122* 164* 186*   Lipid Profile: No results for input(s): CHOL, HDL, LDLCALC, TRIG, CHOLHDL, LDLDIRECT in the last 72 hours. Thyroid Function Tests: No results for input(s): TSH, T4TOTAL, FREET4, T3FREE, THYROIDAB in the last 72 hours. Anemia Panel: No results for input(s): VITAMINB12, FOLATE, FERRITIN, TIBC, IRON, RETICCTPCT in the last 72 hours. Urine analysis:    Component Value Date/Time   COLORURINE YELLOW 05/02/2017 1353   APPEARANCEUR CLOUDY (A) 05/02/2017 1353   LABSPEC 1.017 05/02/2017 1353   PHURINE 7.0 05/02/2017 1353   GLUCOSEU NEGATIVE 05/02/2017 1353   HGBUR NEGATIVE 05/02/2017 1353   BILIRUBINUR NEGATIVE 05/02/2017 1353   KETONESUR NEGATIVE 05/02/2017 1353   PROTEINUR 100 (A) 05/02/2017 1353   NITRITE NEGATIVE 05/02/2017 1353   LEUKOCYTESUR NEGATIVE 05/02/2017 1353     Omeed Osuna M.D. [facility] 05/20/2017, 11:44 AM  Pager: [phone] Between 7am to 7pm - call Pager - [phone]  After 7pm go to [email or web address] - password [email or web address]  Call night coverage person covering after 7pm

## 2017-05-21 LAB — BASIC METABOLIC PANEL
ANION GAP: 7 (ref 5–15)
BUN: 31 mg/dL — ABNORMAL HIGH (ref 6–20)
CO2: 30 mmol/L (ref 22–32)
Calcium: 10.8 mg/dL — ABNORMAL HIGH (ref 8.9–10.3)
Chloride: 99 mmol/L — ABNORMAL LOW (ref 101–111)
Creatinine, Ser: 0.77 mg/dL (ref 0.61–1.24)
Glucose, Bld: 124 mg/dL — ABNORMAL HIGH (ref 65–99)
POTASSIUM: 4.3 mmol/L (ref 3.5–5.1)
SODIUM: 136 mmol/L (ref 135–145)

## 2017-05-21 LAB — GLUCOSE, CAPILLARY
GLUCOSE-CAPILLARY: 148 mg/dL — AB (ref 65–99)
GLUCOSE-CAPILLARY: 130 mg/dL — AB (ref 65–99)
Glucose-Capillary: 137 mg/dL — ABNORMAL HIGH (ref 65–99)
Glucose-Capillary: 112 mg/dL — ABNORMAL HIGH (ref 65–99)

## 2017-05-21 LAB — CBC
HCT: 45.8 % (ref 39.0–52.0)
HEMOGLOBIN: 13.5 g/dL (ref 13.0–17.0)
MCH: 27.4 pg (ref 26.0–34.0)
MCHC: 29.5 g/dL — ABNORMAL LOW (ref 30.0–36.0)
MCV: 92.9 fL (ref 78.0–100.0)
PLATELETS: 215 10*3/uL (ref 150–400)
RBC: 4.93 MIL/uL (ref 4.22–5.81)
RDW: 16.2 % — ABNORMAL HIGH (ref 11.5–15.5)
WBC: 11.6 10*3/uL — AB (ref 4.0–10.5)

## 2017-05-21 NOTE — Progress Notes (Signed)
[facility]                                                                              Patient Demographics  Joe Wilkinson, is a 56 y.o. male, DOB - 10-19-1960, XVQ:[id number]  Admit date - 03/31/2017   Admitting Physician Kerney Elbe, MD  Outpatient Primary MD for the patient is Patient, No Pcp Per  Outpatient specialists:   LOS - 50  days   Medical records reviewed and are as summarized below:    No chief complaint on file.      Brief summary  HPI on 04/01/2017 by Dr. 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 He was transferred to [facility] ICU for NeuroSx care, seen by neurosurgery and Neuro with plans for craniotomy. He was intubated and sedated and unable to provide any HPI, ROS, PMH, PSH, FH, home meds, allergies. Interim history  He was transferred to the ICU and underwent decompressive right craniectomy 8/25. Hypertonic saline protocol was completed. He required transfusion support and was treated for AFib with RVR, continued on the ventilator, eventually receiving trach 9/5 and PEG 9/6, on ATC 9/7. Patient does not have health insurance and because of his craniectomy without the ability to fit a helmet, most skilled nursing facilities areunwilling to accept him. Currently he is getting meds through PEG and continuous TF. Heparin was converted to prophylaxis Lovenox. He was transferred from [facility] service to [facility] service on 9/25. Currently awaiting placement to snf.   Assessment & Plan   Massive right MCA territory infarct, PCA watershed infarct- acute CVA - the patient was found unresponsive, hemiparesis and was found to have acute right MCA CVA - Neurosurgery, neurology were consulted, s/p post craniotomy, cytotoxic edema requiring decompression with craniotomy, hypersaline treatment - Continue trach, PEG for dysphagia,  also tolerating dysphagia 2 diet  - Neurosurgery follow-up in 2 months. - LDL 38, hemoglobin A1c 14.3 - Repeat CT head showed further swelling in the region of complete right MCA territory infarction with more extensive bulging of the brain through the craniectomy defect. No midline shift. At superior margin there could be some extension of the swollen brain through dural defect. At the superior margin, small amount of petechial bleeding within the region of infarction - edically stable, awaiting skilled nursing facility   Acute respiratory failure with hypoxia and hypercarbia - the patient was intubated at the time of admission, status post trach - Continue pulmonary hygiene, followed by PCCM - per PCCM on 10/9, #4 cuffless Shiley change to another #4 cuffless   Septic shock secondary to S agalactiae pneumonia - resolved, completed antibiotics on 10/8   New-onset atrial fibrillation with RVR - currently normal sinus rhythm, [place] vasc 4 - patient was not placed on an decortication due to anemia requiring 4 units packed RBCs. Hemoglobin has remained stable, started on Eliquis by neurology   Blood loss anemia- acute on chronic - unclear source,FOBT negative - Hemoglobin has remained stable in the past several weeks, follow CBC   Escherichia  coli, Klebsiella UTI - completed Keflex on 05/15/17 for 10 days  Nutrition, dysphagia - continue tube feeds,tolerating dysphagia 2 diet  Perianal cellulitis with possible abscess - Gen. Surgery was consulted,was placed on clindamycin, completed    Code Status:full CODE STATUS DVT Prophylaxis:  eliquis  Family Communication: no family at the bedside  Disposition Plan: medically stable, awaiting skilled nursing facility  Time Spent in minutes   15 minutes  Procedures:  -LP 8/24 >> glucose 205, RBC 16, protein 62, WBC 5 -CT head 8/24 >> Large Rt MCA -8/25 decompressive craniectomy -EEG 8/27 >> bilateral cerebral dysfunction, non-specific  in etiology -Echo 8/27 >> EF 65 to 70% -CT abd/pelvis 8/30 >> third spacing, 3.5 cm low attenuation Lt hepatic lobe -CT Head 9/3>> Severe right hemisphere cytotoxic edema with stable extension of edematous brain through the right craniectomy defect. Stable petechial hemorrhage without malignant hemorrhagicTransformation. Stable intracranial mass effect with leftward midline shift of 7 mm. Small left PCA territory infarct. -9/5 Trach placement.  -9/6 PEG tube placement.  -CXR 9/23 > LLL atx and small effusion -Barrium swallow 9/21 > severe oral and mod pharyngeal dysphagia; mod to severe aspiration risk -CT head on 10/1 shows Further swelling in the region of the complete right MCA territory infarction with more extensive bulging of the brain through the craniectomy defect. No midline shift.   Consultants:   PCCM Neurology Neurosurgery General surgery   Antimicrobials:      Medications  Scheduled Meds: . acetaminophen (TYLENOL) oral liquid 160 mg/5 mL  650 mg Per Tube TID  . apixaban  5 mg Per Tube BID  . chlorhexidine  15 mL Mouth Rinse BID  . feeding supplement (PRO-STAT SUGAR FREE 64)  30 mL Per Tube BID  . free water  250 mL Per Tube Q8H  . gabapentin  300 mg Oral QHS  . glycopyrrolate  1 mg Per Tube BID  . insulin aspart  0-20 Units Subcutaneous TID WC  . insulin aspart  0-5 Units Subcutaneous QHS  . insulin detemir  34 Units Subcutaneous Daily  . metoprolol tartrate  12.5 mg Per Tube BID  . pantoprazole sodium  40 mg Per Tube Daily   Continuous Infusions: . feeding supplement (GLUCERNA 1.2 CAL) 1,000 mL (05/21/17 1149)   PRN Meds:.bisacodyl, docusate, ibuprofen, ipratropium-albuterol, [DISCONTINUED] ondansetron **OR** ondansetron (ZOFRAN) IV, promethazine   Antibiotics   Anti-infectives    Start     Dose/Rate Route Frequency Ordered Stop   05/05/17 2200  cephALEXin (KEFLEX) capsule 500 mg     500 mg Oral Every 12 hours 05/05/17 1640 05/15/17 1042   04/26/17 0600   vancomycin (VANCOCIN) IVPB 750 mg/150 ml premix  Status:  Discontinued     750 mg 150 mL/hr over 60 Minutes Intravenous Every 12 hours 04/25/17 1729 04/25/17 1730   04/25/17 1830  clindamycin (CLEOCIN) capsule 300 mg     300 mg Oral Every 6 hours 04/25/17 1730 04/29/17 1717   04/25/17 1800  vancomycin (VANCOCIN) IVPB 750 mg/150 ml premix  Status:  Discontinued     750 mg 150 mL/hr over 60 Minutes Intravenous Every 12 hours 04/25/17 1728 04/25/17 1729   04/23/17 0945  piperacillin-tazobactam (ZOSYN) IVPB 3.375 g  Status:  Discontinued     3.375 g 12.5 mL/hr over 240 Minutes Intravenous Every 8 hours 04/23/17 0930 04/25/17 1730   04/23/17 0430  vancomycin (VANCOCIN) IVPB 1000 mg/200 mL premix  Status:  Discontinued     1,000 mg 200 mL/hr over 60 Minutes  Intravenous Every 12 hours 04/22/17 1655 04/25/17 1728   04/22/17 1630  vancomycin (VANCOCIN) IVPB 1000 mg/200 mL premix     1,000 mg 200 mL/hr over 60 Minutes Intravenous  Once 04/22/17 1627 04/22/17 1826   04/08/17 0200  piperacillin-tazobactam (ZOSYN) IVPB 3.375 g  Status:  Discontinued     3.375 g 12.5 mL/hr over 240 Minutes Intravenous Every 8 hours 04/08/17 0058 04/15/17 1601   04/08/17 0100  piperacillin-tazobactam (ZOSYN) IVPB 3.375 g  Status:  Discontinued     3.375 g 100 mL/hr over 30 Minutes Intravenous Every 6 hours 04/08/17 0054 04/08/17 0057   04/03/17 1200  ampicillin (OMNIPEN) 2 g in sodium chloride 0.9 % 50 mL IVPB     2 g 150 mL/hr over 20 Minutes Intravenous Every 6 hours 04/03/17 1005 04/06/17 1837   04/01/17 1800  vancomycin (VANCOCIN) IVPB 1000 mg/200 mL premix  Status:  Discontinued     1,000 mg 200 mL/hr over 60 Minutes Intravenous Every 24 hours 04/01/17 0146 04/01/17 0154   04/01/17 1600  ceFAZolin (ANCEF) IVPB 2g/100 mL premix     2 g 200 mL/hr over 30 Minutes Intravenous Every 8 hours 04/01/17 1100 04/02/17 0056   04/01/17 0828  bacitracin 50,000 Units in sodium chloride irrigation 0.9 % 500 mL irrigation   Status:  Discontinued       As needed 04/01/17 0837 04/01/17 0947   04/01/17 0600  piperacillin-tazobactam (ZOSYN) IVPB 3.375 g  Status:  Discontinued     3.375 g 12.5 mL/hr over 240 Minutes Intravenous Every 8 hours 04/01/17 0146 04/01/17 0154   04/01/17 0600  vancomycin (VANCOCIN) IVPB 750 mg/150 ml premix  Status:  Discontinued     750 mg 150 mL/hr over 60 Minutes Intravenous Every 12 hours 04/01/17 0225 04/03/17 0959   04/01/17 0400  piperacillin-tazobactam (ZOSYN) IVPB 3.375 g  Status:  Discontinued     3.375 g 12.5 mL/hr over 240 Minutes Intravenous Every 8 hours 04/01/17 0225 04/03/17 0959        Subjective:   Joe Wilkinson was seen and examined today. No complaints, no fevers. No acute issues overnight. Patient denies dizziness, chest pain, shortness of breath, abdominal pain, N/V/D/C, new weakness, numbess, tingling.   Objective:   Vitals:   05/21/17 0307 05/21/17 0631 05/21/17 0838 05/21/17 0915  BP:  (!) 102/22  120/78  Pulse: 82 92 95 (!) 105  Resp: 16 16 16 12   Temp:  98.2 F (36.8 C)  98.9 F (37.2 C)  TempSrc:  Oral  Oral  SpO2: 97% 96% 96% 97%  Weight:      Height:        Intake/Output Summary (Last 24 hours) at 05/21/17 1204 Last data filed at 05/21/17 1000  Gross per 24 hour  Intake             1215 ml  Output             2150 ml  Net             -935 ml     Wt Readings from Last 3 Encounters:  05/12/17 83.7 kg (184 lb 9.6 oz)  03/30/17 113.4 kg (250 lb)     Exam General: Alert and oriented x 3, NAD Eyes:  HEENT:  Right craniectomy, trach Cardiovascular: S1 S2 clear, RRR, No pedal edema b/l Respiratory: CTAB Gastrointestinal: Soft, nontender, nondistended, + bowel sounds, PEG + Ext: no pedal edema bilaterally Neuro: left-sided hemiparesis Musculoskeletal: No digital cyanosis, clubbing Skin: No  rashes Psych: Normal affect and demeanor, alert and oriented x3      Data Reviewed:  I have personally reviewed following labs and imaging  studies  Micro Results No results found for this or any previous visit (from the past 240 hour(s)).  Radiology Reports Ct Head Wo Contrast  Result Date: 05/08/2017 CLINICAL DATA:  Followup craniectomy.  Headache. EXAM: CT HEAD WITHOUT CONTRAST TECHNIQUE: Contiguous axial images were obtained from the base of the skull through the vertex without intravenous contrast. COMPARISON:  04/10/2017 and multiple previous FINDINGS: Brain: Massive swelling in the region of the complete right MCA territory infarction with more protrusion of the infarcted brain tissue through the craniectomy defect. Mild petechial bleeding within the superior portion of the infarction but without frank hematoma. No new vascular territory involvement. No midline shift because of the decompressive craniectomy. No ventricular trapping. No extra-axial collection. Along the upper region of the craniectomy, I wonder if there could actually be a dural defect through which some of the swollen brain has extended. Vascular: No other vascular finding. Skull: No unexpected finding related to the craniectomy. Sinuses/Orbits: No significant sinus finding. Old blowout fracture on the right. Other: None significant IMPRESSION: Further swelling in the region of the complete right MCA territory infarction with more extensive bulging of the brain through the craniectomy defect. No midline shift however, because of the decompressive craniectomy. At the superior margin, there could be some extension of the swollen brain through a dural defect. Also at the superior margin, there is a small amount of petechial bleeding within the region of infarction. Electronically Signed   By: Nelson Chimes M.D.   On: 05/08/2017 14:12   Ct Abdomen Pelvis W Contrast  Result Date: 04/22/2017 CLINICAL DATA:  Perianal abscess. Status post stroke and complicated recovery. EXAM: CT ABDOMEN AND PELVIS WITH CONTRAST TECHNIQUE: Multidetector CT imaging of the abdomen and pelvis was  performed using the standard protocol following bolus administration of intravenous contrast. CONTRAST:  148mL ISOVUE-300 IOPAMIDOL (ISOVUE-300) INJECTION 61% COMPARISON:  CT abdomen and pelvis April 06, 2017 FINDINGS: LOWER CHEST: Bilateral lower lobe atelectasis. Heart size is upper limits of normal. No pericardial effusion. Included heart size is normal. No pericardial effusion. HEPATOBILIARY: 2.5 cm heterogeneously enhancing mass LEFT lobe of the liver with centripetal puddling. Liver is otherwise unremarkable. Status post cholecystectomy. PANCREAS: Normal. SPLEEN: Normal. ADRENALS/URINARY TRACT: Kidneys are orthotopic, demonstrating symmetric enhancement. No nephrolithiasis, hydronephrosis or solid renal masses. Too small to characterize hypodensities in the kidneys bilaterally. The unopacified ureters are normal in course and caliber. Delayed imaging through the kidneys demonstrates symmetric prompt contrast excretion within the proximal urinary collecting system. Urinary bladder is partially distended and unremarkable. Normal adrenal glands. STOMACH/BOWEL: The stomach, small and large bowel are normal in course and caliber without inflammatory changes, sensitivity decreased without oral contrast. Intraluminal gastrostomy retaining bulb. Moderate colonic diverticulosis. Normal appendix. VASCULAR/LYMPHATIC: Aortoiliac vessels are normal in course and caliber. Mild calcific atherosclerosis. No lymphadenopathy by CT size criteria. Prominent inguinal lymph nodes are likely reactive. REPRODUCTIVE: Normal. OTHER: No intraperitoneal free fluid or free air. MUSCULOSKELETAL: Perianal soft tissue stranding and soft tissue density without focal fluid collection, subcutaneous gas or radiopaque foreign bodies inferior extent of process incompletely imaged. No intraperitoneal extension. Small bilateral fat containing inguinal hernias. Small fat containing umbilical hernia. Severe LEFT L5-S1 facet arthropathy. IMPRESSION:  1. Perianal cellulitis without drainable fluid collection ; inferior extent of process not imaged. 2. No acute intra-abdominal or pelvic process. 3. 2.5 cm hepatic hemangioma.  Aortic Atherosclerosis (ICD10-I70.0). Electronically Signed   By: Elon Alas M.D.   On: 04/22/2017 23:37   Dg Chest Port 1 View  Result Date: 05/13/2017 CLINICAL DATA:  Leukocytosis. EXAM: PORTABLE CHEST 1 VIEW COMPARISON:  05/05/2017 FINDINGS: Tracheostomy remains in place. There is mild atelectasis at both lung bases. No consolidation or lobar collapse. Upper lungs are clear. No heart failure or effusion. IMPRESSION: Mild bibasilar atelectasis. Electronically Signed   By: Nelson Chimes M.D.   On: 05/13/2017 08:55   Dg Chest Port 1 View  Result Date: 05/05/2017 CLINICAL DATA:  Neck pain EXAM: PORTABLE CHEST 1 VIEW COMPARISON:  Chest radiograph 04/30/2017 FINDINGS: Tracheostomy tube tip is at the level of the clavicular heads. Cardiomediastinal contours are unchanged. No focal airspace consolidation or pulmonary edema. No pneumothorax. Unchanged probable left pleural effusion and associated atelectasis. IMPRESSION: Unchanged small left pleural effusion and associated atelectasis. Electronically Signed   By: Ulyses Jarred M.D.   On: 05/05/2017 20:58   Dg Chest Port 1 View  Result Date: 04/30/2017 CLINICAL DATA:  Respiratory failure EXAM: PORTABLE CHEST 1 VIEW COMPARISON:  04/24/2017 FINDINGS: Left lower lobe airspace disease has progressed in the interval. Small left effusion. Right lung clear. Tracheostomy remains in good position IMPRESSION: Progression of mild left lower lobe atelectasis/ infiltrate and small left effusion. Electronically Signed   By: Franchot Gallo M.D.   On: 04/30/2017 15:20   Dg Chest Port 1 View  Result Date: 04/24/2017 CLINICAL DATA:  Replacement of tracheostomy tube today. EXAM: PORTABLE CHEST 1 VIEW COMPARISON:  Single-view of the chest 04/22/2017. FINDINGS: Tracheostomy tube is in place with the  tip in good position at the level of the clavicular heads. Lungs are clear. Heart size is normal. No pneumothorax or pleural effusion. No acute abnormality. IMPRESSION: Tracheostomy tube in good position.  No acute disease. Electronically Signed   By: Inge Rise M.D.   On: 04/24/2017 12:20   Dg Chest Port 1 View  Result Date: 04/22/2017 CLINICAL DATA:  Followup respiratory failure. EXAM: PORTABLE CHEST 1 VIEW COMPARISON:  04/18/2017 FINDINGS: Tracheostomy remains in place. Upper lungs are clear. Mild volume loss in both lower lobes. No worsening or new findings. IMPRESSION: Persistent mild volume loss in both lower lobes. Electronically Signed   By: Nelson Chimes M.D.   On: 04/22/2017 08:25   Dg Abd 2 Views  Result Date: 05/06/2017 CLINICAL DATA:  Abdominal distension. EXAM: ABDOMEN - 2 VIEW COMPARISON:  May 02, 2017. FINDINGS: No abnormal bowel dilatation is noted. Gastrostomy tube is seen projected over gastric air bubble. Residual contrast is seen in the colon. No abnormal calcifications are noted. IMPRESSION: No evidence of bowel obstruction or ileus. Electronically Signed   By: Marijo Conception, M.D.   On: 05/06/2017 21:02   Dg Abd Portable 1v  Result Date: 05/02/2017 CLINICAL DATA:  Abdomen pain EXAM: PORTABLE ABDOMEN - 1 VIEW COMPARISON:  None. FINDINGS: The bowel gas pattern is normal. No radio-opaque calculi or other significant radiographic abnormality are seen. Surgical clips are projected over the right pelvis. IMPRESSION: No acute abnormality. Electronically Signed   By: Abelardo Diesel M.D.   On: 05/02/2017 15:33   Dg Swallowing Func-speech Pathology  Result Date: 05/05/2017 Objective Swallowing Evaluation: Type of Study: MBS-Modified Barium Swallow Study Patient Details Name: Joe Wilkinson MRN: [id number] Date of Birth: 26-Dec-1960 Today's Date: 05/05/2017 Time: SLP Start Time (ACUTE ONLY): 1350-SLP Stop Time (ACUTE ONLY): 1410 SLP Time Calculation (min) (ACUTE ONLY): 20 min Past  Medical History:  No past medical history on file. Past Surgical History: Past Surgical History: Procedure Laterality Date . CRANIECTOMY Right 04/01/2017  Procedure: RIGHT DECOMPRESSIVE CRANIECTOMY;  Surgeon: Ditty, Kevan Ny, MD;  Location: [place];  Service: Neurosurgery;  Laterality: Right; . ESOPHAGOGASTRODUODENOSCOPY N/A 04/13/2017  Procedure: ESOPHAGOGASTRODUODENOSCOPY (EGD);  Surgeon: Georganna Skeans, MD;  Location: [place];  Service: General;  Laterality: N/A;  bedside . PEG PLACEMENT N/A 04/13/2017  Procedure: PERCUTANEOUS ENDOSCOPIC GASTROSTOMY (PEG) PLACEMENT;  Surgeon: Georganna Skeans, MD;  [facility] ENDOSCOPY;  Service: General;  Laterality: N/A; HPI: 56 yo male brought to [facility] with altered mental status from DKA and sepsis. Found to have RT MCA CVA and transfer to [facility]. Required Rt decompressive craniectomy and remained on vent post op.ETT 8/23-9/5, s/p tracheostomy 9/5.  Subjective: pt eager for POs Assessment / Plan / Recommendation CHL IP CLINICAL IMPRESSIONS 05/05/2017 Clinical Impression  Pt demonstrates cognitive impairment impacting swallow function. Prolonged liangual rocking and pumping occurs with most boluses, particulalry purees, though a liquid wash is beneficial to facilitate oral transit. Swallow response is delayed reuslting in flash penetration events. Otherwise oropharyngeal mechanism is WNL. Pt may initaite a puree/thin diet though intake may be poor and assistance will be needed for attention with feeding and to avoid prolonged oral holding. PMSV to be in place with all PO. WIll f/u for tolerance.  SLP Visit Diagnosis Dysphagia, oropharyngeal phase (R13.12) Attention and concentration deficit following -- Frontal lobe and executive function deficit following -- Impact on safety and function --   CHL IP TREATMENT RECOMMENDATION 05/05/2017 Treatment Recommendations Therapy as outlined in treatment plan below   Prognosis 05/05/2017 Prognosis for Safe Diet Advancement Good Barriers  to Reach Goals -- Barriers/Prognosis Comment -- CHL IP DIET RECOMMENDATION 05/05/2017 SLP Diet Recommendations Thin liquid;Dysphagia 1 (Puree) solids Liquid Administration via Cup;Straw Medication Administration Crushed with puree Compensations Minimize environmental distractions;Follow solids with liquid Postural Changes Seated upright at 90 degrees   CHL IP OTHER RECOMMENDATIONS 05/05/2017 Recommended Consults -- Oral Care Recommendations Other (Comment);Oral care BID Other Recommendations Have oral suction available;Place PMSV during PO intake   CHL IP FOLLOW UP RECOMMENDATIONS 05/05/2017 Follow up Recommendations Skilled Nursing facility   [facility] IP FREQUENCY AND DURATION 05/05/2017 Speech Therapy Frequency (ACUTE ONLY) min 2x/week Treatment Duration 2 weeks      CHL IP ORAL PHASE 05/05/2017 Oral Phase Impaired Oral - Pudding Teaspoon -- Oral - Pudding Cup -- Oral - Honey Teaspoon NT Oral - Honey Cup -- Oral - Nectar Teaspoon -- Oral - Nectar Cup -- Oral - Nectar Straw NT Oral - Thin Teaspoon NT Oral - Thin Cup -- Oral - Thin Straw Holding of bolus;Lingual pumping Oral - Puree Lingual pumping;Holding of bolus Oral - Mech Soft -- Oral - Regular Lingual pumping;Holding of bolus Oral - Multi-Consistency -- Oral - Pill -- Oral Phase - Comment --  CHL IP PHARYNGEAL PHASE 05/05/2017 Pharyngeal Phase Impaired Pharyngeal- Pudding Teaspoon -- Pharyngeal -- Pharyngeal- Pudding Cup -- Pharyngeal -- Pharyngeal- Honey Teaspoon NT Pharyngeal -- Pharyngeal- Honey Cup -- Pharyngeal -- Pharyngeal- Nectar Teaspoon -- Pharyngeal -- Pharyngeal- Nectar Cup -- Pharyngeal -- Pharyngeal- Nectar Straw NT Pharyngeal -- Pharyngeal- Thin Teaspoon NT Pharyngeal -- Pharyngeal- Thin Cup -- Pharyngeal -- Pharyngeal- Thin Straw Delayed swallow initiation-pyriform sinuses;Penetration/Aspiration during swallow Pharyngeal Material enters airway, remains ABOVE vocal cords then ejected out;Material does not enter airway Pharyngeal- Puree Delayed swallow  initiation-pyriform sinuses Pharyngeal -- Pharyngeal- Mechanical Soft Delayed swallow initiation-pyriform sinuses Pharyngeal -- Pharyngeal- Regular -- Pharyngeal -- Pharyngeal- Multi-consistency --  Pharyngeal -- Pharyngeal- Pill -- Pharyngeal -- Pharyngeal Comment --  CHL IP CERVICAL ESOPHAGEAL PHASE 04/28/2017 Cervical Esophageal Phase WFL Pudding Teaspoon -- Pudding Cup -- Honey Teaspoon -- Honey Cup -- Nectar Teaspoon -- Nectar Cup -- Nectar Straw -- Thin Teaspoon -- Thin Cup -- Thin Straw -- Puree -- Mechanical Soft -- Regular -- Multi-consistency -- Pill -- Cervical Esophageal Comment -- No flowsheet data found. DeBlois, Katherene Ponto 05/05/2017, 3:25 PM              Dg Swallowing Func-speech Pathology  Result Date: 04/28/2017 Objective Swallowing Evaluation: Type of Study: MBS-Modified Barium Swallow Study Patient Details Name: Joe Wilkinson MRN: [id number] Date of Birth: 1961/01/30 Today's Date: 04/28/2017 Time: SLP Start Time (ACUTE ONLY): 0939-SLP Stop Time (ACUTE ONLY): 1003 SLP Time Calculation (min) (ACUTE ONLY): 24 min Past Medical History: No past medical history on file. Past Surgical History: Past Surgical History: Procedure Laterality Date . CRANIECTOMY Right 04/01/2017  Procedure: RIGHT DECOMPRESSIVE CRANIECTOMY;  Surgeon: Ditty, Kevan Ny, MD;  Location: [place];  Service: Neurosurgery;  Laterality: Right; . ESOPHAGOGASTRODUODENOSCOPY N/A 04/13/2017  Procedure: ESOPHAGOGASTRODUODENOSCOPY (EGD);  Surgeon: Georganna Skeans, MD;  Location: [place];  Service: General;  Laterality: N/A;  bedside . PEG PLACEMENT N/A 04/13/2017  Procedure: PERCUTANEOUS ENDOSCOPIC GASTROSTOMY (PEG) PLACEMENT;  Surgeon: Georganna Skeans, MD;  Location: [facility] ENDOSCOPY;  Service: General;  Laterality: N/A; HPI: 56 yo male brought to [facility] with altered mental status from DKA and sepsis. Found to have RT MCA CVA and transfer to [facility]. Required Rt decompressive craniectomy and remained on vent post op.ETT 8/23-9/5, s/p  tracheostomy 9/5.  Subjective: pt eager for POs Assessment / Plan / Recommendation CHL IP CLINICAL IMPRESSIONS 04/28/2017 Clinical Impression Pt has a severe oral and moderate pharyngeal dysphagia. Significant oral holding and anteropr/posterior rocking is observed, especially with thicker textures. Most of the puree was orally suctioned after not being able to initiate a/p transfer given Max cues, significant extra time, and small amounts of honey thick liquids as a liquid wash. Small amounts had already fallen posteriorly to the pharynx without a swallow triggered. He moved thin and nectar thick liquids more readily into his pharynx, but a delay in swallow and incomplete glottal closure allowed for silent aspiration before/druing the swallow.  Pt will not cough to command because it makes his head hurt. Small spoonfuls of thin liquid, even after pooling in his pyriform sinuses, did not enter the airway. Recommend allowing a few ice chips, given one at a time by staff, following oral care to provide pt comfort and increase use of swallowing musculature. Would not leave patient with a cup of ice as he would be impulsive with his intake and has trouble consistently initiating a swallow. SLP will continue to follow. SLP Visit Diagnosis Dysphagia, oropharyngeal phase (R13.12) Attention and concentration deficit following -- Frontal lobe and executive function deficit following -- Impact on safety and function Moderate aspiration risk;Severe aspiration risk;Risk for inadequate nutrition/hydration   [facility]L IP TREATMENT RECOMMENDATION 04/28/2017 Treatment Recommendations Therapy as outlined in treatment plan below   Prognosis 04/28/2017 Prognosis for Safe Diet Advancement Good Barriers to Reach Goals Severity of deficits Barriers/Prognosis Comment -- [facility] IP DIET RECOMMENDATION 04/28/2017 SLP Diet Recommendations NPO;Ice chips PRN after oral care Liquid Administration via -- Medication Administration Via alternative means  Compensations -- Postural Changes --   [facility] IP OTHER RECOMMENDATIONS 04/28/2017 Recommended Consults -- Oral Care Recommendations Oral care QID;Other (Comment) Other Recommendations Have oral suction available   [facility] IP FOLLOW  UP RECOMMENDATIONS 04/28/2017 Follow up Recommendations Skilled Nursing facility   [facility] IP FREQUENCY AND DURATION 04/28/2017 Speech Therapy Frequency (ACUTE ONLY) min 2x/week Treatment Duration 2 weeks      [facility] IP ORAL PHASE 04/28/2017 Oral Phase Impaired Oral - Pudding Teaspoon -- Oral - Pudding Cup -- Oral - Honey Teaspoon Left anterior bolus loss;Weak lingual manipulation;Reduced posterior propulsion;Holding of bolus;Lingual/palatal residue;Delayed oral transit;Decreased bolus cohesion Oral - Honey Cup -- Oral - Nectar Teaspoon -- Oral - Nectar Cup -- Oral - Nectar Straw Left anterior bolus loss;Weak lingual manipulation;Reduced posterior propulsion;Holding of bolus;Lingual/palatal residue;Delayed oral transit;Decreased bolus cohesion Oral - Thin Teaspoon Left anterior bolus loss;Weak lingual manipulation;Reduced posterior propulsion;Holding of bolus;Lingual/palatal residue;Delayed oral transit;Decreased bolus cohesion Oral - Thin Cup -- Oral - Thin Straw Left anterior bolus loss;Weak lingual manipulation;Reduced posterior propulsion;Holding of bolus;Lingual/palatal residue;Delayed oral transit;Decreased bolus cohesion Oral - Puree Left anterior bolus loss;Weak lingual manipulation;Reduced posterior propulsion;Holding of bolus;Lingual/palatal residue;Delayed oral transit;Decreased bolus cohesion Oral - Mech Soft -- Oral - Regular -- Oral - Multi-Consistency -- Oral - Pill -- Oral Phase - Comment --  [facility] IP PHARYNGEAL PHASE 04/28/2017 Pharyngeal Phase Impaired Pharyngeal- Pudding Teaspoon -- Pharyngeal -- Pharyngeal- Pudding Cup -- Pharyngeal -- Pharyngeal- Honey Teaspoon Delayed swallow initiation-pyriform sinuses;Reduced airway/laryngeal closure Pharyngeal -- Pharyngeal- Honey Cup -- Pharyngeal --  Pharyngeal- Nectar Teaspoon -- Pharyngeal -- Pharyngeal- Nectar Cup -- Pharyngeal -- Pharyngeal- Nectar Straw Delayed swallow initiation-pyriform sinuses;Reduced airway/laryngeal closure;Penetration/Aspiration during swallow Pharyngeal Material enters airway, passes BELOW cords without attempt by patient to eject out (silent aspiration) Pharyngeal- Thin Teaspoon Delayed swallow initiation-pyriform sinuses;Reduced airway/laryngeal closure Pharyngeal -- Pharyngeal- Thin Cup -- Pharyngeal -- Pharyngeal- Thin Straw Delayed swallow initiation-pyriform sinuses;Reduced airway/laryngeal closure;Penetration/Aspiration before swallow Pharyngeal Material enters airway, passes BELOW cords without attempt by patient to eject out (silent aspiration) Pharyngeal- Puree Delayed swallow initiation-pyriform sinuses;Reduced airway/laryngeal closure Pharyngeal -- Pharyngeal- Mechanical Soft -- Pharyngeal -- Pharyngeal- Regular -- Pharyngeal -- Pharyngeal- Multi-consistency -- Pharyngeal -- Pharyngeal- Pill -- Pharyngeal -- Pharyngeal Comment --  CHL IP CERVICAL ESOPHAGEAL PHASE 04/28/2017 Cervical Esophageal Phase WFL Pudding Teaspoon -- Pudding Cup -- Honey Teaspoon -- Honey Cup -- Nectar Teaspoon -- Nectar Cup -- Nectar Straw -- Thin Teaspoon -- Thin Cup -- Thin Straw -- Puree -- Mechanical Soft -- Regular -- Multi-consistency -- Pill -- Cervical Esophageal Comment -- No flowsheet data found. [name] 04/28/2017, 11:05 AM  [name], M.A. CCC-SLP [phone]              Lab Data:  CBC:  Recent Labs Lab 05/15/17 0507 05/17/17 0320 05/20/17 0340 05/21/17 0610  WBC 13.0* 14.7* 15.2* 11.6*  HGB 11.9* 12.3* 13.9 13.5  HCT 40.0 40.9 46.2 45.8  MCV 93.5 92.7 92.6 92.9  PLT 226 245 239 809   Basic Metabolic Panel:  Recent Labs Lab 05/15/17 0507 05/17/17 0320 05/20/17 0340 05/21/17 0610  NA 137 137 137 136  K 4.5 4.4 4.6 4.3  CL 102 99* 102 99*  CO2 27 25 26 30   GLUCOSE 142* 109* 127* 124*  BUN  28* 29* 29* 31*  CREATININE 0.75 0.75 0.65 0.77  CALCIUM 10.6* 10.9* 11.1* 10.8*   GFR: Estimated Creatinine Clearance: 106.5 mL/min (by C-G formula based on SCr of 0.77 mg/dL). Liver Function Tests: No results for input(s): AST, ALT, ALKPHOS, BILITOT, PROT, ALBUMIN in the last 168 hours. No results for input(s): LIPASE, AMYLASE in the last 168 hours. No results for input(s): AMMONIA in the last 168 hours. Coagulation Profile: No results for  input(s): INR, PROTIME in the last 168 hours. Cardiac Enzymes: No results for input(s): CKTOTAL, CKMB, CKMBINDEX, TROPONINI in the last 168 hours. BNP (last 3 results) No results for input(s): PROBNP in the last 8760 hours. HbA1C: No results for input(s): HGBA1C in the last 72 hours. CBG:  Recent Labs Lab 05/20/17 1125 05/20/17 1617 05/20/17 2152 05/21/17 0635 05/21/17 1116  GLUCAP 186* 111* 118* 148* 130*   Lipid Profile: No results for input(s): CHOL, HDL, LDLCALC, TRIG, CHOLHDL, LDLDIRECT in the last 72 hours. Thyroid Function Tests: No results for input(s): TSH, T4TOTAL, FREET4, T3FREE, THYROIDAB in the last 72 hours. Anemia Panel: No results for input(s): VITAMINB12, FOLATE, FERRITIN, TIBC, IRON, RETICCTPCT in the last 72 hours. Urine analysis:    Component Value Date/Time   COLORURINE YELLOW 05/02/2017 1353   APPEARANCEUR CLOUDY (A) 05/02/2017 1353   LABSPEC 1.017 05/02/2017 1353   PHURINE 7.0 05/02/2017 1353   GLUCOSEU NEGATIVE 05/02/2017 1353   HGBUR NEGATIVE 05/02/2017 1353   BILIRUBINUR NEGATIVE 05/02/2017 1353   KETONESUR NEGATIVE 05/02/2017 1353   PROTEINUR 100 (A) 05/02/2017 1353   NITRITE NEGATIVE 05/02/2017 1353   LEUKOCYTESUR NEGATIVE 05/02/2017 1353     Chandani Rogowski M.D. [facility] 05/21/2017, 12:04 PM  Pager: [phone] Between 7am to 7pm - call Pager - [phone]  After 7pm go to [email or web address] - password [email or web address]  Call night coverage person covering after 7pm

## 2017-05-22 DIAGNOSIS — Z930 Tracheostomy status: Secondary | ICD-10-CM

## 2017-05-22 DIAGNOSIS — J9620 Acute and chronic respiratory failure, unspecified whether with hypoxia or hypercapnia: Secondary | ICD-10-CM

## 2017-05-22 LAB — GLUCOSE, CAPILLARY
GLUCOSE-CAPILLARY: 172 mg/dL — AB (ref 65–99)
GLUCOSE-CAPILLARY: 163 mg/dL — AB (ref 65–99)
Glucose-Capillary: 113 mg/dL — ABNORMAL HIGH (ref 65–99)
Glucose-Capillary: 126 mg/dL — ABNORMAL HIGH (ref 65–99)

## 2017-05-22 LAB — BASIC METABOLIC PANEL
Anion gap: 9 (ref 5–15)
BUN: 29 mg/dL — AB (ref 6–20)
CHLORIDE: 100 mmol/L — AB (ref 101–111)
CO2: 26 mmol/L (ref 22–32)
Calcium: 10.3 mg/dL (ref 8.9–10.3)
Creatinine, Ser: 0.71 mg/dL (ref 0.61–1.24)
Glucose, Bld: 138 mg/dL — ABNORMAL HIGH (ref 65–99)
POTASSIUM: 4.3 mmol/L (ref 3.5–5.1)
SODIUM: 135 mmol/L (ref 135–145)

## 2017-05-22 LAB — CBC
HEMATOCRIT: 41.3 % (ref 39.0–52.0)
Hemoglobin: 12.7 g/dL — ABNORMAL LOW (ref 13.0–17.0)
MCH: 28.5 pg (ref 26.0–34.0)
MCHC: 30.8 g/dL (ref 30.0–36.0)
MCV: 92.6 fL (ref 78.0–100.0)
Platelets: 228 10*3/uL (ref 150–400)
RBC: 4.46 MIL/uL (ref 4.22–5.81)
RDW: 16.2 % — AB (ref 11.5–15.5)
WBC: 11.6 10*3/uL — ABNORMAL HIGH (ref 4.0–10.5)

## 2017-05-22 NOTE — Progress Notes (Signed)
  Speech Language Pathology Treatment: Cognitive-Linquistic  Patient Details Name: Joe Wilkinson MRN: [id number] DOB: 08/25/1960 Today's Date: 05/22/2017 Time: 8466-5993 SLP Time Calculation (min) (ACUTE ONLY): 10 min  Assessment / Plan / Recommendation Clinical Impression  Pt seen with card game while up in chair, needed moderate to max verbal and visual cues to direct gaze left during game. Pt able to increase volume to make requests over call bell with verbal cue x1 with min assist. Pt needs encouragement to participate.   HPI HPI: 56 yo male brought to [facility] with altered mental status from DKA and sepsis. 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 Required Rt decompressive craniectomy and remained on vent post op.ETT 8/23-9/5, s/p tracheostomy 9/5.       SLP Plan  Continue with current plan of care       Recommendations                   Plan: Continue with current plan of care       GO               [facility], MA CCC-SLP [phone]  Lynann Beaver 05/22/2017, 2:12 PM

## 2017-05-22 NOTE — Progress Notes (Signed)
Physical Therapy Treatment Patient Details Name: Joe Wilkinson MRN: [id number] DOB: November 25, 1960 Today's Date: 05/22/2017    History of Present Illness 56 yo male brought to [facility] with altered mental status from DKA and sepsis.  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  Required Rt decompressive craniectomy and remained on vent post op.ETT 8/23-9/5, s/p tracheostomy 9/5    PT Comments    Patient seen for OOB activity progression. Continues to require +2 physical assist for all aspects of OOB mobility. Tolerate sit <> stand with +2 max assist and pivot to chair. Attempted neuro facilitation for trunk control at EOB. Patient tolerated well but continues to require assist. Will continue to see and progress as tolerated. Goals updated as indicated.    Follow Up Recommendations  SNF;Supervision/Assistance - 24 hour     Equipment Recommendations  None recommended by PT    Recommendations for Other Services       Precautions / Restrictions Precautions Precautions: Fall;Other (comment) Precaution Comments: craniectomy, no bone flap right side; trach in place; peg tube; L side neglect Restrictions Weight Bearing Restrictions: No Other Position/Activity Restrictions: craniectomy, no bone flap right side    Mobility  Bed Mobility Overal bed mobility: Needs Assistance Bed Mobility: Rolling;Supine to Sit Rolling: +2 for physical assistance;Max assist   Supine to sit: Mod assist;HOB elevated;+2 for physical assistance Sit to supine: +2 for physical assistance;Max assist   General bed mobility comments: max assist to roll onto left side with hand over hand guidance of right hand to rail. mod assist to perform partial roll to right. mod assist for coming to sitting at edge of bed with pt assisting minimally to advance legs to/off edge and minimal use of arms to elevate trunk into sitting at edge of bed.   Transfers Overall transfer level: Needs assistance Equipment used:  (face to face  with gait belt and chuck pad) Transfers: Sit to/from Stand;Stand Pivot Transfers Sit to Stand: Max assist;+2 physical assistance Stand pivot transfers: Max assist;+2 physical assistance;From elevated surface       General transfer comment: Patient elevated to standind with LLE blocked out during transition. Performex sit <> stand x2 and pivot OOB to chair with increased assist.  Ambulation/Gait         Gait velocity: unable   General Gait Details: unable   Stairs            Wheelchair Mobility    Modified Rankin (Stroke Patients Only) Modified Rankin (Stroke Patients Only) Pre-Morbid Rankin Score: No symptoms Modified Rankin: Severe disability     Balance Overall balance assessment: Needs assistance Sitting-balance support: Feet supported;Single extremity supported Sitting balance-Leahy Scale: Poor Sitting balance - Comments: facilitation techniques for EOB activity. able to maintain upright for 5-10 seconds  Postural control: Left lateral lean;Posterior lean   Standing balance-Leahy Scale: Zero Standing balance comment: 2 person mod/max assist to stand and for standing balance with UE support. Tactile facilitation for upright posture                            Cognition Arousal/Alertness: Awake/alert Behavior During Therapy: Flat affect Overall Cognitive Status: Impaired/Different from baseline Area of Impairment: Problem solving;Awareness;Safety/judgement;Following commands;Attention;Memory                   Current Attention Level: Sustained Memory: Decreased recall of precautions;Decreased short-term memory Following Commands: Follows one step commands with increased time;Follows one step commands inconsistently;Follows multi-step commands inconsistently;Follows  multi-step commands with increased time Safety/Judgement: Decreased awareness of safety;Decreased awareness of deficits Awareness: Intellectual Problem Solving: Slow  processing;Decreased initiation;Difficulty sequencing;Requires verbal cues;Requires tactile cues General Comments: cotninues to require cues to attend to L side.       Exercises Other Exercises Other Exercises: Trunk extenion and flexion with tactile cues and assist for stability at EOB    General Comments        Pertinent Vitals/Pain Pain Assessment: 0-10 Faces Pain Scale: Hurts little more Pain Location: Left leg and hand Pain Descriptors / Indicators: Aching;Tender;Guarding;Grimacing Pain Intervention(s): Monitored during session;Repositioned    Home Living                      Prior Function            PT Goals (current goals can now be found in the care plan section) Acute Rehab PT Goals Patient Stated Goal: none stated PT Goal Formulation: Patient unable to participate in goal setting Time For Goal Achievement: 06/01/17 Potential to Achieve Goals: Fair Progress towards PT goals: Goals downgraded-see care plan    Frequency    Min 3X/week      PT Plan Current plan remains appropriate    Co-evaluation              AM-PAC PT "6 Clicks" Daily Activity  Outcome Measure  Difficulty turning over in bed (including adjusting bedclothes, sheets and blankets)?: Unable Difficulty moving from lying on back to sitting on the side of the bed? : Unable Difficulty sitting down on and standing up from a chair with arms (e.g., wheelchair, bedside commode, etc,.)?: Unable Help needed moving to and from a bed to chair (including a wheelchair)?: Total Help needed walking in hospital room?: Total Help needed climbing 3-5 steps with a railing? : Total 6 Click Score: 6    End of Session Equipment Utilized During Treatment: Gait belt Activity Tolerance: Patient tolerated treatment well;No increased pain Patient left: in chair;with call bell/phone within reach;with chair alarm set Nurse Communication: Mobility status;Need for lift equipment PT Visit Diagnosis:  Other symptoms and signs involving the nervous system (R29.898);Hemiplegia and hemiparesis;Unsteadiness on feet (R26.81);Muscle weakness (generalized) (M62.81) Hemiplegia - Right/Left: Left Hemiplegia - dominant/non-dominant: Non-dominant Hemiplegia - caused by: Nontraumatic intracerebral hemorrhage     Time: 0539-7673 PT Time Calculation (min) (ACUTE ONLY): 19 min  Charges:  $Therapeutic Activity: 8-22 mins                    G Codes:       Alben Deeds, PT DPT  Board Certified Neurologic Specialist [place] 05/22/2017, 1:43 PM

## 2017-05-22 NOTE — Progress Notes (Signed)
RT changed patient trach from 4 cuffless to 4 cuffless with help of other RT. No apparent complications or signs of distress. Patient vitals stable throughout and currently, RT will continue to monitor.

## 2017-05-22 NOTE — Progress Notes (Signed)
[facility]                                                                              Patient Demographics  Joe Wilkinson, is a 56 y.o. male, DOB - 05/31/1961, YJE:[id number]  Admit date - 03/31/2017   Admitting Physician Kerney Elbe, MD  Outpatient Primary MD for the patient is Patient, No Pcp Per  Outpatient specialists:   LOS - 51  days   Medical records reviewed and are as summarized below:    No chief complaint on file.      Brief summary  HPI on 04/01/2017 by Dr. 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 He was transferred to [facility] ICU for NeuroSx care, seen by neurosurgery and Neuro with plans for craniotomy. He was intubated and sedated and unable to provide any HPI, ROS, PMH, PSH, FH, home meds, allergies. Interim history  He was transferred to the ICU and underwent decompressive right craniectomy 8/25. Hypertonic saline protocol was completed. He required transfusion support and was treated for AFib with RVR, continued on the ventilator, eventually receiving trach 9/5 and PEG 9/6, on ATC 9/7. Patient does not have health insurance and because of his craniectomy without the ability to fit a helmet, most skilled nursing facilities areunwilling to accept him. Currently he is getting meds through PEG and continuous TF. Heparin was converted to prophylaxis Lovenox. He was transferred from [facility] service to [facility] service on 9/25. Currently awaiting placement to snf.   Assessment & Plan   Massive right MCA territory infarct, PCA watershed infarct- acute CVA - the patient was found unresponsive, hemiparesis and was found to have acute right MCA CVA - Neurosurgery, neurology were consulted, s/p post craniotomy, cytotoxic edema requiring decompression with craniotomy, hypersaline treatment - Continue trach, PEG for dysphagia,  also tolerating dysphagia 2 diet  - Neurosurgery follow-up in 2 months. - LDL 38, hemoglobin A1c 14.3 - Repeat CT head showed further swelling in the region of complete right MCA territory infarction with more extensive bulging of the brain through the craniectomy defect. No midline shift. At superior margin there could be some extension of the swollen brain through dural defect. At the superior margin, small amount of petechial bleeding within the region of infarction - Awaiting skilled nursing facility   Acute respiratory failure with hypoxia and hypercarbia - the patient was intubated at the time of admission, status post trach - Continue pulmonary hygiene, followed by PCCM - per PCCM on 10/9, #4 cuffless Shiley change to another #4 cuffless   Septic shock secondary to S agalactiae pneumonia - resolved, completed antibiotics on 10/8  New-onset atrial fibrillation with RVR - currently normal sinus rhythm, [place] vasc 4 - patient was not placed on an decortication due to anemia requiring 4 units packed RBCs. Hemoglobin has remained stable, started on Eliquis by neurology   Blood loss anemia- acute on chronic - unclear source,FOBT negative - Hemoglobin has remained stable in the past several weeks - H&H stable   Escherichia coli, Klebsiella  UTI - completed Keflex on 05/15/17 for 10 days  Nutrition, dysphagia - continue tube feeds,tolerating dysphagia 2 diet  Perianal cellulitis with possible abscess - Gen. Surgery was consulted,was placed on clindamycin, completed    Code Status:full CODE STATUS DVT Prophylaxis:  eliquis  Family Communication: no family at the bedside  Disposition Plan: medically stable, awaiting skilled nursing facility  Time Spent in minutes   15 minutes  Procedures:  -LP 8/24 >> glucose 205, RBC 16, protein 62, WBC 5 -CT head 8/24 >> Large Rt MCA -8/25 decompressive craniectomy -EEG 8/27 >> bilateral cerebral dysfunction, non-specific in  etiology -Echo 8/27 >> EF 65 to 70% -CT abd/pelvis 8/30 >> third spacing, 3.5 cm low attenuation Lt hepatic lobe -CT Head 9/3>> Severe right hemisphere cytotoxic edema with stable extension of edematous brain through the right craniectomy defect. Stable petechial hemorrhage without malignant hemorrhagicTransformation. Stable intracranial mass effect with leftward midline shift of 7 mm. Small left PCA territory infarct. -9/5 Trach placement.  -9/6 PEG tube placement.  -CXR 9/23 > LLL atx and small effusion -Barrium swallow 9/21 > severe oral and mod pharyngeal dysphagia; mod to severe aspiration risk -CT head on 10/1 shows Further swelling in the region of the complete right MCA territory infarction with more extensive bulging of the brain through the craniectomy defect. No midline shift.   Consultants:   PCCM Neurology Neurosurgery General surgery   Antimicrobials:      Medications  Scheduled Meds: . acetaminophen (TYLENOL) oral liquid 160 mg/5 mL  650 mg Per Tube TID  . apixaban  5 mg Per Tube BID  . chlorhexidine  15 mL Mouth Rinse BID  . feeding supplement (PRO-STAT SUGAR FREE 64)  30 mL Per Tube BID  . free water  250 mL Per Tube Q8H  . gabapentin  300 mg Oral QHS  . glycopyrrolate  1 mg Per Tube BID  . insulin aspart  0-20 Units Subcutaneous TID WC  . insulin aspart  0-5 Units Subcutaneous QHS  . insulin detemir  34 Units Subcutaneous Daily  . metoprolol tartrate  12.5 mg Per Tube BID  . pantoprazole sodium  40 mg Per Tube Daily   Continuous Infusions: . feeding supplement (GLUCERNA 1.2 CAL) 1,000 mL (05/22/17 0449)   PRN Meds:.bisacodyl, docusate, ibuprofen, ipratropium-albuterol, [DISCONTINUED] ondansetron **OR** ondansetron (ZOFRAN) IV, promethazine   Antibiotics   Anti-infectives    Start     Dose/Rate Route Frequency Ordered Stop   05/05/17 2200  cephALEXin (KEFLEX) capsule 500 mg     500 mg Oral Every 12 hours 05/05/17 1640 05/15/17 1042   04/26/17 0600   vancomycin (VANCOCIN) IVPB 750 mg/150 ml premix  Status:  Discontinued     750 mg 150 mL/hr over 60 Minutes Intravenous Every 12 hours 04/25/17 1729 04/25/17 1730   04/25/17 1830  clindamycin (CLEOCIN) capsule 300 mg     300 mg Oral Every 6 hours 04/25/17 1730 04/29/17 1717   04/25/17 1800  vancomycin (VANCOCIN) IVPB 750 mg/150 ml premix  Status:  Discontinued     750 mg 150 mL/hr over 60 Minutes Intravenous Every 12 hours 04/25/17 1728 04/25/17 1729   04/23/17 0945  piperacillin-tazobactam (ZOSYN) IVPB 3.375 g  Status:  Discontinued     3.375 g 12.5 mL/hr over 240 Minutes Intravenous Every 8 hours 04/23/17 0930 04/25/17 1730   04/23/17 0430  vancomycin (VANCOCIN) IVPB 1000 mg/200 mL premix  Status:  Discontinued     1,000 mg 200 mL/hr over 60 Minutes Intravenous Every  12 hours 04/22/17 1655 04/25/17 1728   04/22/17 1630  vancomycin (VANCOCIN) IVPB 1000 mg/200 mL premix     1,000 mg 200 mL/hr over 60 Minutes Intravenous  Once 04/22/17 1627 04/22/17 1826   04/08/17 0200  piperacillin-tazobactam (ZOSYN) IVPB 3.375 g  Status:  Discontinued     3.375 g 12.5 mL/hr over 240 Minutes Intravenous Every 8 hours 04/08/17 0058 04/15/17 1601   04/08/17 0100  piperacillin-tazobactam (ZOSYN) IVPB 3.375 g  Status:  Discontinued     3.375 g 100 mL/hr over 30 Minutes Intravenous Every 6 hours 04/08/17 0054 04/08/17 0057   04/03/17 1200  ampicillin (OMNIPEN) 2 g in sodium chloride 0.9 % 50 mL IVPB     2 g 150 mL/hr over 20 Minutes Intravenous Every 6 hours 04/03/17 1005 04/06/17 1837   04/01/17 1800  vancomycin (VANCOCIN) IVPB 1000 mg/200 mL premix  Status:  Discontinued     1,000 mg 200 mL/hr over 60 Minutes Intravenous Every 24 hours 04/01/17 0146 04/01/17 0154   04/01/17 1600  ceFAZolin (ANCEF) IVPB 2g/100 mL premix     2 g 200 mL/hr over 30 Minutes Intravenous Every 8 hours 04/01/17 1100 04/02/17 0056   04/01/17 0828  bacitracin 50,000 Units in sodium chloride irrigation 0.9 % 500 mL irrigation   Status:  Discontinued       As needed 04/01/17 0837 04/01/17 0947   04/01/17 0600  piperacillin-tazobactam (ZOSYN) IVPB 3.375 g  Status:  Discontinued     3.375 g 12.5 mL/hr over 240 Minutes Intravenous Every 8 hours 04/01/17 0146 04/01/17 0154   04/01/17 0600  vancomycin (VANCOCIN) IVPB 750 mg/150 ml premix  Status:  Discontinued     750 mg 150 mL/hr over 60 Minutes Intravenous Every 12 hours 04/01/17 0225 04/03/17 0959   04/01/17 0400  piperacillin-tazobactam (ZOSYN) IVPB 3.375 g  Status:  Discontinued     3.375 g 12.5 mL/hr over 240 Minutes Intravenous Every 8 hours 04/01/17 0225 04/03/17 0959        Subjective:   Joe Wilkinson was seen and examined today. No complaints, no fevers or chills. Patient denies dizziness, chest pain, shortness of breath, abdominal pain, N/V/D/C, new weakness, numbess, tingling.   Objective:   Vitals:   05/22/17 0326 05/22/17 0605 05/22/17 0943 05/22/17 1130  BP:  133/77    Pulse: 78 98 (!) 107 (!) 111  Resp: 18 18 18 18   Temp:  99.3 F (37.4 C)    TempSrc:  Oral    SpO2: 97% 97% 98% 96%  Weight:      Height:        Intake/Output Summary (Last 24 hours) at 05/22/17 1151 Last data filed at 05/22/17 0825  Gross per 24 hour  Intake              360 ml  Output             1300 ml  Net             -940 ml     Wt Readings from Last 3 Encounters:  05/12/17 83.7 kg (184 lb 9.6 oz)  03/30/17 113.4 kg (250 lb)     Exam   General: Alert and oriented x 3, NAD  Eyes:   HEENT: +trach, right craniectomy  Cardiovascular: S1 S2 clear, RRR  Respiratory: CTAB  Gastrointestinal: Soft, NT, PEG +  Ext: no pedal edema bilaterally  Neuro: left-sided weakness  Musculoskeletal: No digital cyanosis, clubbing  Skin: No rashes  Psych: Normal  affect and demeanor, alert and oriented x3    Data Reviewed:  I have personally reviewed following labs and imaging studies  Micro Results No results found for this or any previous visit (from the  past 240 hour(s)).  Radiology Reports Ct Head Wo Contrast  Result Date: 05/08/2017 CLINICAL DATA:  Followup craniectomy.  Headache. EXAM: CT HEAD WITHOUT CONTRAST TECHNIQUE: Contiguous axial images were obtained from the base of the skull through the vertex without intravenous contrast. COMPARISON:  04/10/2017 and multiple previous FINDINGS: Brain: Massive swelling in the region of the complete right MCA territory infarction with more protrusion of the infarcted brain tissue through the craniectomy defect. Mild petechial bleeding within the superior portion of the infarction but without frank hematoma. No new vascular territory involvement. No midline shift because of the decompressive craniectomy. No ventricular trapping. No extra-axial collection. Along the upper region of the craniectomy, I wonder if there could actually be a dural defect through which some of the swollen brain has extended. Vascular: No other vascular finding. Skull: No unexpected finding related to the craniectomy. Sinuses/Orbits: No significant sinus finding. Old blowout fracture on the right. Other: None significant IMPRESSION: Further swelling in the region of the complete right MCA territory infarction with more extensive bulging of the brain through the craniectomy defect. No midline shift however, because of the decompressive craniectomy. At the superior margin, there could be some extension of the swollen brain through a dural defect. Also at the superior margin, there is a small amount of petechial bleeding within the region of infarction. Electronically Signed   By: Nelson Chimes M.D.   On: 05/08/2017 14:12   Ct Abdomen Pelvis W Contrast  Result Date: 04/22/2017 CLINICAL DATA:  Perianal abscess. Status post stroke and complicated recovery. EXAM: CT ABDOMEN AND PELVIS WITH CONTRAST TECHNIQUE: Multidetector CT imaging of the abdomen and pelvis was performed using the standard protocol following bolus administration of intravenous  contrast. CONTRAST:  1110mL ISOVUE-300 IOPAMIDOL (ISOVUE-300) INJECTION 61% COMPARISON:  CT abdomen and pelvis April 06, 2017 FINDINGS: LOWER CHEST: Bilateral lower lobe atelectasis. Heart size is upper limits of normal. No pericardial effusion. Included heart size is normal. No pericardial effusion. HEPATOBILIARY: 2.5 cm heterogeneously enhancing mass LEFT lobe of the liver with centripetal puddling. Liver is otherwise unremarkable. Status post cholecystectomy. PANCREAS: Normal. SPLEEN: Normal. ADRENALS/URINARY TRACT: Kidneys are orthotopic, demonstrating symmetric enhancement. No nephrolithiasis, hydronephrosis or solid renal masses. Too small to characterize hypodensities in the kidneys bilaterally. The unopacified ureters are normal in course and caliber. Delayed imaging through the kidneys demonstrates symmetric prompt contrast excretion within the proximal urinary collecting system. Urinary bladder is partially distended and unremarkable. Normal adrenal glands. STOMACH/BOWEL: The stomach, small and large bowel are normal in course and caliber without inflammatory changes, sensitivity decreased without oral contrast. Intraluminal gastrostomy retaining bulb. Moderate colonic diverticulosis. Normal appendix. VASCULAR/LYMPHATIC: Aortoiliac vessels are normal in course and caliber. Mild calcific atherosclerosis. No lymphadenopathy by CT size criteria. Prominent inguinal lymph nodes are likely reactive. REPRODUCTIVE: Normal. OTHER: No intraperitoneal free fluid or free air. MUSCULOSKELETAL: Perianal soft tissue stranding and soft tissue density without focal fluid collection, subcutaneous gas or radiopaque foreign bodies inferior extent of process incompletely imaged. No intraperitoneal extension. Small bilateral fat containing inguinal hernias. Small fat containing umbilical hernia. Severe LEFT L5-S1 facet arthropathy. IMPRESSION: 1. Perianal cellulitis without drainable fluid collection ; inferior extent of  process not imaged. 2. No acute intra-abdominal or pelvic process. 3. 2.5 cm hepatic hemangioma. Aortic Atherosclerosis (ICD10-I70.0). Electronically Signed  By: Elon Alas M.D.   On: 04/22/2017 23:37   Dg Chest Port 1 View  Result Date: 05/13/2017 CLINICAL DATA:  Leukocytosis. EXAM: PORTABLE CHEST 1 VIEW COMPARISON:  05/05/2017 FINDINGS: Tracheostomy remains in place. There is mild atelectasis at both lung bases. No consolidation or lobar collapse. Upper lungs are clear. No heart failure or effusion. IMPRESSION: Mild bibasilar atelectasis. Electronically Signed   By: Nelson Chimes M.D.   On: 05/13/2017 08:55   Dg Chest Port 1 View  Result Date: 05/05/2017 CLINICAL DATA:  Neck pain EXAM: PORTABLE CHEST 1 VIEW COMPARISON:  Chest radiograph 04/30/2017 FINDINGS: Tracheostomy tube tip is at the level of the clavicular heads. Cardiomediastinal contours are unchanged. No focal airspace consolidation or pulmonary edema. No pneumothorax. Unchanged probable left pleural effusion and associated atelectasis. IMPRESSION: Unchanged small left pleural effusion and associated atelectasis. Electronically Signed   By: Ulyses Jarred M.D.   On: 05/05/2017 20:58   Dg Chest Port 1 View  Result Date: 04/30/2017 CLINICAL DATA:  Respiratory failure EXAM: PORTABLE CHEST 1 VIEW COMPARISON:  04/24/2017 FINDINGS: Left lower lobe airspace disease has progressed in the interval. Small left effusion. Right lung clear. Tracheostomy remains in good position IMPRESSION: Progression of mild left lower lobe atelectasis/ infiltrate and small left effusion. Electronically Signed   By: Franchot Gallo M.D.   On: 04/30/2017 15:20   Dg Chest Port 1 View  Result Date: 04/24/2017 CLINICAL DATA:  Replacement of tracheostomy tube today. EXAM: PORTABLE CHEST 1 VIEW COMPARISON:  Single-view of the chest 04/22/2017. FINDINGS: Tracheostomy tube is in place with the tip in good position at the level of the clavicular heads. Lungs are clear.  Heart size is normal. No pneumothorax or pleural effusion. No acute abnormality. IMPRESSION: Tracheostomy tube in good position.  No acute disease. Electronically Signed   By: Inge Rise M.D.   On: 04/24/2017 12:20   Dg Abd 2 Views  Result Date: 05/06/2017 CLINICAL DATA:  Abdominal distension. EXAM: ABDOMEN - 2 VIEW COMPARISON:  May 02, 2017. FINDINGS: No abnormal bowel dilatation is noted. Gastrostomy tube is seen projected over gastric air bubble. Residual contrast is seen in the colon. No abnormal calcifications are noted. IMPRESSION: No evidence of bowel obstruction or ileus. Electronically Signed   By: Marijo Conception, M.D.   On: 05/06/2017 21:02   Dg Abd Portable 1v  Result Date: 05/02/2017 CLINICAL DATA:  Abdomen pain EXAM: PORTABLE ABDOMEN - 1 VIEW COMPARISON:  None. FINDINGS: The bowel gas pattern is normal. No radio-opaque calculi or other significant radiographic abnormality are seen. Surgical clips are projected over the right pelvis. IMPRESSION: No acute abnormality. Electronically Signed   By: Abelardo Diesel M.D.   On: 05/02/2017 15:33   Dg Swallowing Func-speech Pathology  Result Date: 05/05/2017 Objective Swallowing Evaluation: Type of Study: MBS-Modified Barium Swallow Study Patient Details Name: Urias Sheek MRN: [id number] Date of Birth: [birth year] Today's Date: 05/05/2017 Time: SLP Start Time (ACUTE ONLY): 1350-SLP Stop Time (ACUTE ONLY): 1410 SLP Time Calculation (min) (ACUTE ONLY): 20 min Past Medical History: No past medical history on file. Past Surgical History: Past Surgical History: Procedure Laterality Date . CRANIECTOMY Right 04/01/2017  Procedure: RIGHT DECOMPRESSIVE CRANIECTOMY;  Surgeon: Ditty, Kevan Ny, MD;  Location: [place];  Service: Neurosurgery;  Laterality: Right; . ESOPHAGOGASTRODUODENOSCOPY N/A 04/13/2017  Procedure: ESOPHAGOGASTRODUODENOSCOPY (EGD);  Surgeon: Georganna Skeans, MD;  Location: [place];  Service: General;  Laterality: N/A;  bedside .  PEG PLACEMENT N/A 04/13/2017  Procedure: PERCUTANEOUS ENDOSCOPIC GASTROSTOMY (PEG)  PLACEMENT;  Surgeon: Georganna Skeans, MD;  Location: [facility] ENDOSCOPY;  Service: General;  Laterality: N/A; HPI: 56 yo male brought to [facility] with altered mental status from DKA and sepsis. Found to have RT MCA CVA and transfer to [facility]. Required Rt decompressive craniectomy and remained on vent post op.ETT 8/23-9/5, s/p tracheostomy 9/5.  Subjective: pt eager for POs Assessment / Plan / Recommendation CHL IP CLINICAL IMPRESSIONS 05/05/2017 Clinical Impression  Pt demonstrates cognitive impairment impacting swallow function. Prolonged liangual rocking and pumping occurs with most boluses, particulalry purees, though a liquid wash is beneficial to facilitate oral transit. Swallow response is delayed reuslting in flash penetration events. Otherwise oropharyngeal mechanism is WNL. Pt may initaite a puree/thin diet though intake may be poor and assistance will be needed for attention with feeding and to avoid prolonged oral holding. PMSV to be in place with all PO. WIll f/u for tolerance.  SLP Visit Diagnosis Dysphagia, oropharyngeal phase (R13.12) Attention and concentration deficit following -- Frontal lobe and executive function deficit following -- Impact on safety and function --   CHL IP TREATMENT RECOMMENDATION 05/05/2017 Treatment Recommendations Therapy as outlined in treatment plan below   Prognosis 05/05/2017 Prognosis for Safe Diet Advancement Good Barriers to Reach Goals -- Barriers/Prognosis Comment -- CHL IP DIET RECOMMENDATION 05/05/2017 SLP Diet Recommendations Thin liquid;Dysphagia 1 (Puree) solids Liquid Administration via Cup;Straw Medication Administration Crushed with puree Compensations Minimize environmental distractions;Follow solids with liquid Postural Changes Seated upright at 90 degrees   CHL IP OTHER RECOMMENDATIONS 05/05/2017 Recommended Consults -- Oral Care Recommendations Other (Comment);Oral care BID Other  Recommendations Have oral suction available;Place PMSV during PO intake   CHL IP FOLLOW UP RECOMMENDATIONS 05/05/2017 Follow up Recommendations Skilled Nursing facility   [facility] IP FREQUENCY AND DURATION 05/05/2017 Speech Therapy Frequency (ACUTE ONLY) min 2x/week Treatment Duration 2 weeks      CHL IP ORAL PHASE 05/05/2017 Oral Phase Impaired Oral - Pudding Teaspoon -- Oral - Pudding Cup -- Oral - Honey Teaspoon NT Oral - Honey Cup -- Oral - Nectar Teaspoon -- Oral - Nectar Cup -- Oral - Nectar Straw NT Oral - Thin Teaspoon NT Oral - Thin Cup -- Oral - Thin Straw Holding of bolus;Lingual pumping Oral - Puree Lingual pumping;Holding of bolus Oral - Mech Soft -- Oral - Regular Lingual pumping;Holding of bolus Oral - Multi-Consistency -- Oral - Pill -- Oral Phase - Comment --  CHL IP PHARYNGEAL PHASE 05/05/2017 Pharyngeal Phase Impaired Pharyngeal- Pudding Teaspoon -- Pharyngeal -- Pharyngeal- Pudding Cup -- Pharyngeal -- Pharyngeal- Honey Teaspoon NT Pharyngeal -- Pharyngeal- Honey Cup -- Pharyngeal -- Pharyngeal- Nectar Teaspoon -- Pharyngeal -- Pharyngeal- Nectar Cup -- Pharyngeal -- Pharyngeal- Nectar Straw NT Pharyngeal -- Pharyngeal- Thin Teaspoon NT Pharyngeal -- Pharyngeal- Thin Cup -- Pharyngeal -- Pharyngeal- Thin Straw Delayed swallow initiation-pyriform sinuses;Penetration/Aspiration during swallow Pharyngeal Material enters airway, remains ABOVE vocal cords then ejected out;Material does not enter airway Pharyngeal- Puree Delayed swallow initiation-pyriform sinuses Pharyngeal -- Pharyngeal- Mechanical Soft Delayed swallow initiation-pyriform sinuses Pharyngeal -- Pharyngeal- Regular -- Pharyngeal -- Pharyngeal- Multi-consistency -- Pharyngeal -- Pharyngeal- Pill -- Pharyngeal -- Pharyngeal Comment --  CHL IP CERVICAL ESOPHAGEAL PHASE 04/28/2017 Cervical Esophageal Phase WFL Pudding Teaspoon -- Pudding Cup -- Honey Teaspoon -- Honey Cup -- Nectar Teaspoon -- Nectar Cup -- Nectar Straw -- Thin Teaspoon -- Thin Cup  -- Thin Straw -- Puree -- Mechanical Soft -- Regular -- Multi-consistency -- Pill -- Cervical Esophageal Comment -- No flowsheet data found. DeBlois, Katherene Ponto 05/05/2017, 3:25 PM  Dg Swallowing Func-speech Pathology  Result Date: 04/28/2017 Objective Swallowing Evaluation: Type of Study: MBS-Modified Barium Swallow Study Patient Details Name: Eileen Croswell MRN: [id number] Date of Birth: 1961-05-16 Today's Date: 04/28/2017 Time: SLP Start Time (ACUTE ONLY): 0939-SLP Stop Time (ACUTE ONLY): 1003 SLP Time Calculation (min) (ACUTE ONLY): 24 min Past Medical History: No past medical history on file. Past Surgical History: Past Surgical History: Procedure Laterality Date . CRANIECTOMY Right 04/01/2017  Procedure: RIGHT DECOMPRESSIVE CRANIECTOMY;  Surgeon: Ditty, Kevan Ny, MD;  Location: [place];  Service: Neurosurgery;  Laterality: Right; . ESOPHAGOGASTRODUODENOSCOPY N/A 04/13/2017  Procedure: ESOPHAGOGASTRODUODENOSCOPY (EGD);  Surgeon: Georganna Skeans, MD;  Location: [place];  Service: General;  Laterality: N/A;  bedside . PEG PLACEMENT N/A 04/13/2017  Procedure: PERCUTANEOUS ENDOSCOPIC GASTROSTOMY (PEG) PLACEMENT;  Surgeon: Georganna Skeans, MD;  Location: [facility] ENDOSCOPY;  Service: General;  Laterality: N/A; HPI: 56 yo male brought to [facility] with altered mental status from DKA and sepsis. Found to have RT MCA CVA and transfer to [facility]. Required Rt decompressive craniectomy and remained on vent post op.ETT 8/23-9/5, s/p tracheostomy 9/5.  Subjective: pt eager for POs Assessment / Plan / Recommendation CHL IP CLINICAL IMPRESSIONS 04/28/2017 Clinical Impression Pt has a severe oral and moderate pharyngeal dysphagia. Significant oral holding and anteropr/posterior rocking is observed, especially with thicker textures. Most of the puree was orally suctioned after not being able to initiate a/p transfer given Max cues, significant extra time, and small amounts of honey thick liquids as a liquid wash. Small  amounts had already fallen posteriorly to the pharynx without a swallow triggered. He moved thin and nectar thick liquids more readily into his pharynx, but a delay in swallow and incomplete glottal closure allowed for silent aspiration before/druing the swallow.  Pt will not cough to command because it makes his head hurt. Small spoonfuls of thin liquid, even after pooling in his pyriform sinuses, did not enter the airway. Recommend allowing a few ice chips, given one at a time by staff, following oral care to provide pt comfort and increase use of swallowing musculature. Would not leave patient with a cup of ice as he would be impulsive with his intake and has trouble consistently initiating a swallow. SLP will continue to follow. SLP Visit Diagnosis Dysphagia, oropharyngeal phase (R13.12) Attention and concentration deficit following -- Frontal lobe and executive function deficit following -- Impact on safety and function Moderate aspiration risk;Severe aspiration risk;Risk for inadequate nutrition/hydration   [facility] IP TREATMENT RECOMMENDATION 04/28/2017 Treatment Recommendations Therapy as outlined in treatment plan below   Prognosis 04/28/2017 Prognosis for Safe Diet Advancement Good Barriers to Reach Goals Severity of deficits Barriers/Prognosis Comment -- [facility] IP DIET RECOMMENDATION 04/28/2017 SLP Diet Recommendations NPO;Ice chips PRN after oral care Liquid Administration via -- Medication Administration Via alternative means Compensations -- Postural Changes --   [facility] IP OTHER RECOMMENDATIONS 04/28/2017 Recommended Consults -- Oral Care Recommendations Oral care QID;Other (Comment) Other Recommendations Have oral suction available   [facility] IP FOLLOW UP RECOMMENDATIONS 04/28/2017 Follow up Recommendations Skilled Nursing facility   [facility] IP FREQUENCY AND DURATION 04/28/2017 Speech Therapy Frequency (ACUTE ONLY) min 2x/week Treatment Duration 2 weeks      [facility] IP ORAL PHASE 04/28/2017 Oral Phase Impaired Oral - Pudding  Teaspoon -- Oral - Pudding Cup -- Oral - Honey Teaspoon Left anterior bolus loss;Weak lingual manipulation;Reduced posterior propulsion;Holding of bolus;Lingual/palatal residue;Delayed oral transit;Decreased bolus cohesion Oral - Honey Cup -- Oral - Nectar Teaspoon -- Oral - Nectar Cup -- Oral - Nectar Straw  Left anterior bolus loss;Weak lingual manipulation;Reduced posterior propulsion;Holding of bolus;Lingual/palatal residue;Delayed oral transit;Decreased bolus cohesion Oral - Thin Teaspoon Left anterior bolus loss;Weak lingual manipulation;Reduced posterior propulsion;Holding of bolus;Lingual/palatal residue;Delayed oral transit;Decreased bolus cohesion Oral - Thin Cup -- Oral - Thin Straw Left anterior bolus loss;Weak lingual manipulation;Reduced posterior propulsion;Holding of bolus;Lingual/palatal residue;Delayed oral transit;Decreased bolus cohesion Oral - Puree Left anterior bolus loss;Weak lingual manipulation;Reduced posterior propulsion;Holding of bolus;Lingual/palatal residue;Delayed oral transit;Decreased bolus cohesion Oral - Mech Soft -- Oral - Regular -- Oral - Multi-Consistency -- Oral - Pill -- Oral Phase - Comment --  [facility] IP PHARYNGEAL PHASE 04/28/2017 Pharyngeal Phase Impaired Pharyngeal- Pudding Teaspoon -- Pharyngeal -- Pharyngeal- Pudding Cup -- Pharyngeal -- Pharyngeal- Honey Teaspoon Delayed swallow initiation-pyriform sinuses;Reduced airway/laryngeal closure Pharyngeal -- Pharyngeal- Honey Cup -- Pharyngeal -- Pharyngeal- Nectar Teaspoon -- Pharyngeal -- Pharyngeal- Nectar Cup -- Pharyngeal -- Pharyngeal- Nectar Straw Delayed swallow initiation-pyriform sinuses;Reduced airway/laryngeal closure;Penetration/Aspiration during swallow Pharyngeal Material enters airway, passes BELOW cords without attempt by patient to eject out (silent aspiration) Pharyngeal- Thin Teaspoon Delayed swallow initiation-pyriform sinuses;Reduced airway/laryngeal closure Pharyngeal -- Pharyngeal- Thin Cup -- Pharyngeal  -- Pharyngeal- Thin Straw Delayed swallow initiation-pyriform sinuses;Reduced airway/laryngeal closure;Penetration/Aspiration before swallow Pharyngeal Material enters airway, passes BELOW cords without attempt by patient to eject out (silent aspiration) Pharyngeal- Puree Delayed swallow initiation-pyriform sinuses;Reduced airway/laryngeal closure Pharyngeal -- Pharyngeal- Mechanical Soft -- Pharyngeal -- Pharyngeal- Regular -- Pharyngeal -- Pharyngeal- Multi-consistency -- Pharyngeal -- Pharyngeal- Pill -- Pharyngeal -- Pharyngeal Comment --  [facility] IP CERVICAL ESOPHAGEAL PHASE 04/28/2017 Cervical Esophageal Phase WFL Pudding Teaspoon -- Pudding Cup -- Honey Teaspoon -- Honey Cup -- Nectar Teaspoon -- Nectar Cup -- Nectar Straw -- Thin Teaspoon -- Thin Cup -- Thin Straw -- Puree -- Mechanical Soft -- Regular -- Multi-consistency -- Pill -- Cervical Esophageal Comment -- No flowsheet data found. [name] 04/28/2017, 11:05 AM  [name], M.A. CCC-SLP [phone]              Lab Data:  CBC:  Recent Labs Lab 05/17/17 0320 05/20/17 0340 05/21/17 0610 05/22/17 0459  WBC 14.7* 15.2* 11.6* 11.6*  HGB 12.3* 13.9 13.5 12.7*  HCT 40.9 46.2 45.8 41.3  MCV 92.7 92.6 92.9 92.6  PLT 245 239 215 387   Basic Metabolic Panel:  Recent Labs Lab 05/17/17 0320 05/20/17 0340 05/21/17 0610 05/22/17 0459  NA 137 137 136 135  K 4.4 4.6 4.3 4.3  CL 99* 102 99* 100*  CO2 25 26 30 26   GLUCOSE 109* 127* 124* 138*  BUN 29* 29* 31* 29*  CREATININE 0.75 0.65 0.77 0.71  CALCIUM 10.9* 11.1* 10.8* 10.3   GFR: Estimated Creatinine Clearance: 106.5 mL/min (by C-G formula based on SCr of 0.71 mg/dL). Liver Function Tests: No results for input(s): AST, ALT, ALKPHOS, BILITOT, PROT, ALBUMIN in the last 168 hours. No results for input(s): LIPASE, AMYLASE in the last 168 hours. No results for input(s): AMMONIA in the last 168 hours. Coagulation Profile: No results for input(s): INR, PROTIME in the last  168 hours. Cardiac Enzymes: No results for input(s): CKTOTAL, CKMB, CKMBINDEX, TROPONINI in the last 168 hours. BNP (last 3 results) No results for input(s): PROBNP in the last 8760 hours. HbA1C: No results for input(s): HGBA1C in the last 72 hours. CBG:  Recent Labs Lab 05/21/17 0635 05/21/17 1116 05/21/17 1606 05/21/17 2131 05/22/17 0635  GLUCAP 148* 130* 137* 112* 172*   Lipid Profile: No results for input(s): CHOL, HDL, LDLCALC, TRIG, CHOLHDL, LDLDIRECT in the last 72 hours.  Thyroid Function Tests: No results for input(s): TSH, T4TOTAL, FREET4, T3FREE, THYROIDAB in the last 72 hours. Anemia Panel: No results for input(s): VITAMINB12, FOLATE, FERRITIN, TIBC, IRON, RETICCTPCT in the last 72 hours. Urine analysis:    Component Value Date/Time   COLORURINE YELLOW 05/02/2017 1353   APPEARANCEUR CLOUDY (A) 05/02/2017 1353   LABSPEC 1.017 05/02/2017 1353   PHURINE 7.0 05/02/2017 1353   GLUCOSEU NEGATIVE 05/02/2017 1353   HGBUR NEGATIVE 05/02/2017 1353   BILIRUBINUR NEGATIVE 05/02/2017 1353   KETONESUR NEGATIVE 05/02/2017 1353   PROTEINUR 100 (A) 05/02/2017 1353   NITRITE NEGATIVE 05/02/2017 1353   LEUKOCYTESUR NEGATIVE 05/02/2017 1353     Sheehan Stacey M.D. [facility] 05/22/2017, 11:51 AM  Pager: [phone] Between 7am to 7pm - call Pager - [phone]  After 7pm go to [email or web address] - password [email or web address]  Call night coverage person covering after 7pm

## 2017-05-22 NOTE — Progress Notes (Signed)
PULMONARY / CRITICAL CARE MEDICINE   Name: Joe Wilkinson MRN: [id number] DOB: [birth year]    ADMISSION DATE:  03/31/2017 CHIEF COMPLAINT:  CVA  HISTORY OF PRESENT ILLNESS:   56 yo male brought to [facility] with altered mental status from DKA and sepsis.  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  Required Rt decompressive craniectomy and remained on vent post op.  SUBJECTIVE:  He is tolerating PM valve.  Didn't feel like coughing anymore today.  VITAL SIGNS: BP 129/73 (BP Location: Right Arm)   Pulse (!) 107   Temp 99 F (37.2 C) (Oral)   Resp 18   Ht 5\' 10"  (1.778 m)   Wt 184 lb 9.6 oz (83.7 kg)   SpO2 100%   BMI 26.49 kg/m   INTAKE / OUTPUT: I/O last 3 completed shifts: In: 1095 [P.O.:360; Other:60; NG/GT:675] Out: 2650 [Urine:2650]  PHYSICAL EXAMINATION:   General - pleasant Eyes - pupils reactive ENT - trach site clean Cardiac - regular, no murmur Chest - no wheeze, rales Abd - soft, non tender Ext - no edema Skin - no rashes Neuro - follows simple commands   BMET  Recent Labs Lab 05/20/17 0340 05/21/17 0610 05/22/17 0459  NA 137 136 135  K 4.6 4.3 4.3  CL 102 99* 100*  CO2 26 30 26   BUN 29* 31* 29*  CREATININE 0.65 0.77 0.71  GLUCOSE 127* 124* 138*    Electrolytes  Recent Labs Lab 05/20/17 0340 05/21/17 0610 05/22/17 0459  CALCIUM 11.1* 10.8* 10.3    CBC  Recent Labs Lab 05/20/17 0340 05/21/17 0610 05/22/17 0459  WBC 15.2* 11.6* 11.6*  HGB 13.9 13.5 12.7*  HCT 46.2 45.8 41.3  PLT 239 215 228    Glucose  Recent Labs Lab 05/21/17 0635 05/21/17 1116 05/21/17 1606 05/21/17 2131 05/22/17 0635 05/22/17 1159  GLUCAP 148* 130* 137* 112* 172* 163*    Imaging No results found.  STUDIES:  LP 8/24 >> glucose 205, RBC 16, protein 62, WBC 5 CT head 8/24 >> Large Rt MCA EEG 8/27 >> bilateral cerebral dysfunction, non-specific in etiology Echo 8/27 >> EF 65 to 70% CT abd/pelvis 8/30 >> third spacing, 3.5 cm low attenuation Lt  hepatic lobe CT Head 9/3>> Severe right hemisphere cytotoxic edema with stable extension of edematous brain through the right craniectomy defect. Stable petechial hemorrhage without malignant hemorrhagicTransformation. Stable intracranial mass effect with leftward midline shift of 7 mm. Small left PCA territory infarct. CXR 9/23 > LLL atx and small effusion Barrium swallow 9/21 > severe oral and mod pharyngeal dysphagia; mod to severe aspiration risk SLP 10/4 >> tolerating PMV, pocketing food on left  CULTURES: CSF 8/24>> negative BCX 8/24>> negative UCX 8/24>> negative Sputum 8/24>> S. Agalactiae Sputum 9/1>> Consistent with normal resp. Flora UC 9/25 >> klebsiella (S-rocephin, imipenem), Ecoli (S- rocephin, imipenem)  ANTIBIOTICS: vanco 8/24 >> 8/26 Zosyn 8/24 >> 8/26, 9/1 >>9/8  Cleocin 9/18>>>9/22 Ampicillin 8/27 > 8/30  SIGNIFICANT EVENTS: 8/23 Admit [facility] 8/23 8/24 Transfer to [facility] after Rt MCA CVA, start 3% NS 8/25 Decompressive craniectomy 8/27 Transfuse 1 unit PRBC 8/28 Transfuse 1 unit PRBC, A fib with RVR 8/30 Transfuse 1 unit PRBC 8/31 Off pressors, reintubated x 2, transfuse PRBC 9/5 Trach 9/6 PEG 9/14 Trach exchanged from 8 cuffed to 6 cuffless 9/22 Trach exchanged to 4 cuffless 10/01 Tolerating PMV, ATC 28% 10/04 ATC 28%, minimal secretions, tolerating PMV  LINES/TUBES: ETT 8/23 >>9/5 Trach [name]) 9/5 >> PEG 9/6 >>  ASSESSMENT / PLAN:  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

## 2017-05-23 LAB — GLUCOSE, CAPILLARY
GLUCOSE-CAPILLARY: 127 mg/dL — AB (ref 65–99)
GLUCOSE-CAPILLARY: 154 mg/dL — AB (ref 65–99)
GLUCOSE-CAPILLARY: 99 mg/dL (ref 65–99)
Glucose-Capillary: 105 mg/dL — ABNORMAL HIGH (ref 65–99)

## 2017-05-23 NOTE — Progress Notes (Signed)
CSW continuing to follow for SNF placement. Patient remains on the DTP list and has no bed available at this time. CSW will continue to follow.  Laveda Abbe, [place] Clinical Social Worker [phone]

## 2017-05-23 NOTE — Progress Notes (Signed)
[facility]                                                                              Patient Demographics  Joe Wilkinson, is a 56 y.o. male, DOB - 1960/10/27, DTO:[id number]  Admit date - 03/31/2017   Admitting Physician Kerney Elbe, MD  Outpatient Primary MD for the patient is Patient, No Pcp Per  Outpatient specialists:   LOS - 52  days   Medical records reviewed and are as summarized below:    No chief complaint on file.      Brief summary   HPI on 04/01/2017 by Dr. 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 He was transferred to [facility] ICU for NeuroSx care, seen by neurosurgery and Neuro with plans for craniotomy. He was intubated and sedated and unable to provide any HPI, ROS, PMH, PSH, FH, home meds, allergies.  Interim history  He was transferred to the ICU and underwent decompressive right craniectomy 8/25. Hypertonic saline protocol was completed. He required transfusion support and was treated for AFib with RVR, continued on the ventilator, eventually receiving trach 9/5 and PEG 9/6, on ATC 9/7. Patient does not have health insurance and because of his craniectomy without the ability to fit a helmet, most skilled nursing facilities areunwilling to accept him. Currently he is getting meds through PEG and continuous TF, also tolerating dysphagia 2 diet.  He was transferred from [facility] service to [facility] service on 9/25. Currently awaiting placement to SNF. Pulmonology following,getting closer to decannulation of the trach  Assessment & Plan   Massive right MCA territory infarct, PCA watershed infarct- acute CVA - on day of admission, the patient was found unresponsive, hemiparesis and was found to have acute right MCA CVA - Neurosurgery, neurology were consulted, s/p post craniotomy, cytotoxic edema requiring  decompression with craniotomy, hypersaline treatment - Continue trach, PEG for dysphagia, also tolerating dysphagia 2 diet  - Neurosurgery follow-up in 2 months. - LDL 38, hemoglobin A1c 14.3 - Repeat CT head showed further swelling in the region of complete right MCA territory infarction with more extensive bulging of the brain through the craniectomy defect. No midline shift. At superior margin there could be some extension of the swollen brain through dural defect. At the superior margin, small amount of petechial bleeding within the region of infarction - Awaiting skilled nursing facility   Acute respiratory failure with hypoxia and hypercarbia - the patient was intubated at the time of admission, status post trach - continue pulmonary hygiene. PCCM following, per recommendations getting closer to decannulation  Septic shock secondary to S agalactiae pneumonia - resolved, completed antibiotics on 10/8  New-onset atrial fibrillation with RVR - currently normal sinus rhythm, [place] vasc 4 - patient was not initially placed on anticoagulation due to anemia requiring 4 units packed RBCs. Hemoglobin has remained stable, started on Eliquis by neurology on 10/2  Blood loss anemia- acute on chronic - unclear source,FOBT negative - Hemoglobin has remained stable in the past several weeks on anticoagulation -  H&H stable  Escherichia coli, Klebsiella UTI - completed Keflex on 05/15/17 for 10 days  Nutrition, dysphagia - continue tube feeds, tolerating dysphagia 2 diet  Perianal cellulitis with possible abscess - Gen. Surgery was consulted,was placed on clindamycin, completed    Code Status:full CODE STATUS DVT Prophylaxis:  eliquis  Family Communication: no family at the bedside  Disposition Plan: medically stable, awaiting skilled nursing facility  Time Spent in minutes   15 minutes  Procedures:  -LP 8/24 >> glucose 205, RBC 16, protein 62, WBC 5 -CT head 8/24 >> Large Rt  MCA -8/25 decompressive craniectomy -EEG 8/27 >> bilateral cerebral dysfunction, non-specific in etiology -Echo 8/27 >> EF 65 to 70% -CT abd/pelvis 8/30 >> third spacing, 3.5 cm low attenuation Lt hepatic lobe -CT Head 9/3>> Severe right hemisphere cytotoxic edema with stable extension of edematous brain through the right craniectomy defect. Stable petechial hemorrhage without malignant hemorrhagicTransformation. Stable intracranial mass effect with leftward midline shift of 7 mm. Small left PCA territory infarct. -9/5 Trach placement.  -9/6 PEG tube placement.  -CXR 9/23 > LLL atx and small effusion -Barrium swallow 9/21 > severe oral and mod pharyngeal dysphagia; mod to severe aspiration risk -CT head on 10/1 shows Further swelling in the region of the complete right MCA territory infarction with more extensive bulging of the brain through the craniectomy defect. No midline shift.   Consultants:   PCCM Neurology Neurosurgery General surgery   Antimicrobials:      Medications  Scheduled Meds: . acetaminophen (TYLENOL) oral liquid 160 mg/5 mL  650 mg Per Tube TID  . apixaban  5 mg Per Tube BID  . chlorhexidine  15 mL Mouth Rinse BID  . feeding supplement (PRO-STAT SUGAR FREE 64)  30 mL Per Tube BID  . free water  250 mL Per Tube Q8H  . gabapentin  300 mg Oral QHS  . glycopyrrolate  1 mg Per Tube BID  . insulin aspart  0-20 Units Subcutaneous TID WC  . insulin aspart  0-5 Units Subcutaneous QHS  . insulin detemir  34 Units Subcutaneous Daily  . metoprolol tartrate  12.5 mg Per Tube BID  . pantoprazole sodium  40 mg Per Tube Daily   Continuous Infusions: . feeding supplement (GLUCERNA 1.2 CAL) 1,000 mL (05/22/17 2314)   PRN Meds:.bisacodyl, docusate, ibuprofen, ipratropium-albuterol, [DISCONTINUED] ondansetron **OR** ondansetron (ZOFRAN) IV, promethazine   Antibiotics   Anti-infectives    Start     Dose/Rate Route Frequency Ordered Stop   05/05/17 2200  cephALEXin  (KEFLEX) capsule 500 mg     500 mg Oral Every 12 hours 05/05/17 1640 05/15/17 1042   04/26/17 0600  vancomycin (VANCOCIN) IVPB 750 mg/150 ml premix  Status:  Discontinued     750 mg 150 mL/hr over 60 Minutes Intravenous Every 12 hours 04/25/17 1729 04/25/17 1730   04/25/17 1830  clindamycin (CLEOCIN) capsule 300 mg     300 mg Oral Every 6 hours 04/25/17 1730 04/29/17 1717   04/25/17 1800  vancomycin (VANCOCIN) IVPB 750 mg/150 ml premix  Status:  Discontinued     750 mg 150 mL/hr over 60 Minutes Intravenous Every 12 hours 04/25/17 1728 04/25/17 1729   04/23/17 0945  piperacillin-tazobactam (ZOSYN) IVPB 3.375 g  Status:  Discontinued     3.375 g 12.5 mL/hr over 240 Minutes Intravenous Every 8 hours 04/23/17 0930 04/25/17 1730   04/23/17 0430  vancomycin (VANCOCIN) IVPB 1000 mg/200 mL premix  Status:  Discontinued     1,000 mg  200 mL/hr over 60 Minutes Intravenous Every 12 hours 04/22/17 1655 04/25/17 1728   04/22/17 1630  vancomycin (VANCOCIN) IVPB 1000 mg/200 mL premix     1,000 mg 200 mL/hr over 60 Minutes Intravenous  Once 04/22/17 1627 04/22/17 1826   04/08/17 0200  piperacillin-tazobactam (ZOSYN) IVPB 3.375 g  Status:  Discontinued     3.375 g 12.5 mL/hr over 240 Minutes Intravenous Every 8 hours 04/08/17 0058 04/15/17 1601   04/08/17 0100  piperacillin-tazobactam (ZOSYN) IVPB 3.375 g  Status:  Discontinued     3.375 g 100 mL/hr over 30 Minutes Intravenous Every 6 hours 04/08/17 0054 04/08/17 0057   04/03/17 1200  ampicillin (OMNIPEN) 2 g in sodium chloride 0.9 % 50 mL IVPB     2 g 150 mL/hr over 20 Minutes Intravenous Every 6 hours 04/03/17 1005 04/06/17 1837   04/01/17 1800  vancomycin (VANCOCIN) IVPB 1000 mg/200 mL premix  Status:  Discontinued     1,000 mg 200 mL/hr over 60 Minutes Intravenous Every 24 hours 04/01/17 0146 04/01/17 0154   04/01/17 1600  ceFAZolin (ANCEF) IVPB 2g/100 mL premix     2 g 200 mL/hr over 30 Minutes Intravenous Every 8 hours 04/01/17 1100 04/02/17  0056   04/01/17 0828  bacitracin 50,000 Units in sodium chloride irrigation 0.9 % 500 mL irrigation  Status:  Discontinued       As needed 04/01/17 0837 04/01/17 0947   04/01/17 0600  piperacillin-tazobactam (ZOSYN) IVPB 3.375 g  Status:  Discontinued     3.375 g 12.5 mL/hr over 240 Minutes Intravenous Every 8 hours 04/01/17 0146 04/01/17 0154   04/01/17 0600  vancomycin (VANCOCIN) IVPB 750 mg/150 ml premix  Status:  Discontinued     750 mg 150 mL/hr over 60 Minutes Intravenous Every 12 hours 04/01/17 0225 04/03/17 0959   04/01/17 0400  piperacillin-tazobactam (ZOSYN) IVPB 3.375 g  Status:  Discontinued     3.375 g 12.5 mL/hr over 240 Minutes Intravenous Every 8 hours 04/01/17 0225 04/03/17 0959        Subjective:   Joe Wilkinson was seen and examined today. No complaints. Overnight no fevers or chills. No acute issues.  Objective:   Vitals:   05/23/17 0345 05/23/17 0454 05/23/17 1037 05/23/17 1139  BP:  127/85 102/63   Pulse: 80 86 95   Resp: 18 18 19    Temp:  98 F (36.7 C) 99 F (37.2 C)   TempSrc:  Axillary Oral   SpO2: 100% 98% 98% 100%  Weight:      Height:        Intake/Output Summary (Last 24 hours) at 05/23/17 1215 Last data filed at 05/23/17 0600  Gross per 24 hour  Intake             1930 ml  Output             2250 ml  Net             -320 ml     Wt Readings from Last 3 Encounters:  05/12/17 83.7 kg (184 lb 9.6 oz)  03/30/17 113.4 kg (250 lb)     Exam General: Alert and oriented x 3, NAD Eyes:  HEENT: right Craniectomy, trach + Cardiovascular: S1 S2 clear, RRR Respiratory: CTAB Gastrointestinal: Soft, nontender, nondistended, + bowel sounds Ext: no pedal edema bilaterally Neuro: left sided weakness  Musculoskeletal: No digital cyanosis, clubbing Skin: No rashes Psych: Normal affect and demeanor, alert and oriented x3  Data Reviewed:  I have personally reviewed following labs and imaging studies  Micro Results No results found for  this or any previous visit (from the past 240 hour(s)).  Radiology Reports Ct Head Wo Contrast  Result Date: 05/08/2017 CLINICAL DATA:  Followup craniectomy.  Headache. EXAM: CT HEAD WITHOUT CONTRAST TECHNIQUE: Contiguous axial images were obtained from the base of the skull through the vertex without intravenous contrast. COMPARISON:  04/10/2017 and multiple previous FINDINGS: Brain: Massive swelling in the region of the complete right MCA territory infarction with more protrusion of the infarcted brain tissue through the craniectomy defect. Mild petechial bleeding within the superior portion of the infarction but without frank hematoma. No new vascular territory involvement. No midline shift because of the decompressive craniectomy. No ventricular trapping. No extra-axial collection. Along the upper region of the craniectomy, I wonder if there could actually be a dural defect through which some of the swollen brain has extended. Vascular: No other vascular finding. Skull: No unexpected finding related to the craniectomy. Sinuses/Orbits: No significant sinus finding. Old blowout fracture on the right. Other: None significant IMPRESSION: Further swelling in the region of the complete right MCA territory infarction with more extensive bulging of the brain through the craniectomy defect. No midline shift however, because of the decompressive craniectomy. At the superior margin, there could be some extension of the swollen brain through a dural defect. Also at the superior margin, there is a small amount of petechial bleeding within the region of infarction. Electronically Signed   By: Nelson Chimes M.D.   On: 05/08/2017 14:12   Dg Chest Port 1 View  Result Date: 05/13/2017 CLINICAL DATA:  Leukocytosis. EXAM: PORTABLE CHEST 1 VIEW COMPARISON:  05/05/2017 FINDINGS: Tracheostomy remains in place. There is mild atelectasis at both lung bases. No consolidation or lobar collapse. Upper lungs are clear. No heart  failure or effusion. IMPRESSION: Mild bibasilar atelectasis. Electronically Signed   By: Nelson Chimes M.D.   On: 05/13/2017 08:55   Dg Chest Port 1 View  Result Date: 05/05/2017 CLINICAL DATA:  Neck pain EXAM: PORTABLE CHEST 1 VIEW COMPARISON:  Chest radiograph 04/30/2017 FINDINGS: Tracheostomy tube tip is at the level of the clavicular heads. Cardiomediastinal contours are unchanged. No focal airspace consolidation or pulmonary edema. No pneumothorax. Unchanged probable left pleural effusion and associated atelectasis. IMPRESSION: Unchanged small left pleural effusion and associated atelectasis. Electronically Signed   By: Ulyses Jarred M.D.   On: 05/05/2017 20:58   Dg Chest Port 1 View  Result Date: 04/30/2017 CLINICAL DATA:  Respiratory failure EXAM: PORTABLE CHEST 1 VIEW COMPARISON:  04/24/2017 FINDINGS: Left lower lobe airspace disease has progressed in the interval. Small left effusion. Right lung clear. Tracheostomy remains in good position IMPRESSION: Progression of mild left lower lobe atelectasis/ infiltrate and small left effusion. Electronically Signed   By: Franchot Gallo M.D.   On: 04/30/2017 15:20   Dg Chest Port 1 View  Result Date: 04/24/2017 CLINICAL DATA:  Replacement of tracheostomy tube today. EXAM: PORTABLE CHEST 1 VIEW COMPARISON:  Single-view of the chest 04/22/2017. FINDINGS: Tracheostomy tube is in place with the tip in good position at the level of the clavicular heads. Lungs are clear. Heart size is normal. No pneumothorax or pleural effusion. No acute abnormality. IMPRESSION: Tracheostomy tube in good position.  No acute disease. Electronically Signed   By: Inge Rise M.D.   On: 04/24/2017 12:20   Dg Abd 2 Views  Result Date: 05/06/2017 CLINICAL DATA:  Abdominal distension. EXAM:  ABDOMEN - 2 VIEW COMPARISON:  May 02, 2017. FINDINGS: No abnormal bowel dilatation is noted. Gastrostomy tube is seen projected over gastric air bubble. Residual contrast is seen in  the colon. No abnormal calcifications are noted. IMPRESSION: No evidence of bowel obstruction or ileus. Electronically Signed   By: Marijo Conception, M.D.   On: 05/06/2017 21:02   Dg Abd Portable 1v  Result Date: 05/02/2017 CLINICAL DATA:  Abdomen pain EXAM: PORTABLE ABDOMEN - 1 VIEW COMPARISON:  None. FINDINGS: The bowel gas pattern is normal. No radio-opaque calculi or other significant radiographic abnormality are seen. Surgical clips are projected over the right pelvis. IMPRESSION: No acute abnormality. Electronically Signed   By: Abelardo Diesel M.D.   On: 05/02/2017 15:33   Dg Swallowing Func-speech Pathology  Result Date: 05/05/2017 Objective Swallowing Evaluation: Type of Study: MBS-Modified Barium Swallow Study Patient Details Name: Bret Vanessen MRN: [id number] Date of Birth: Feb 06, 1961 Today's Date: 05/05/2017 Time: SLP Start Time (ACUTE ONLY): 1350-SLP Stop Time (ACUTE ONLY): 1410 SLP Time Calculation (min) (ACUTE ONLY): 20 min Past Medical History: No past medical history on file. Past Surgical History: Past Surgical History: Procedure Laterality Date . CRANIECTOMY Right 04/01/2017  Procedure: RIGHT DECOMPRESSIVE CRANIECTOMY;  Surgeon: Ditty, Kevan Ny, MD;  Location: [place];  Service: Neurosurgery;  Laterality: Right; . ESOPHAGOGASTRODUODENOSCOPY N/A 04/13/2017  Procedure: ESOPHAGOGASTRODUODENOSCOPY (EGD);  Surgeon: Georganna Skeans, MD;  Location: [place];  Service: General;  Laterality: N/A;  bedside . PEG PLACEMENT N/A 04/13/2017  Procedure: PERCUTANEOUS ENDOSCOPIC GASTROSTOMY (PEG) PLACEMENT;  Surgeon: Georganna Skeans, MD;  Location: [facility] ENDOSCOPY;  Service: General;  Laterality: N/A; HPI: 56 yo male brought to [facility] with altered mental status from DKA and sepsis. Found to have RT MCA CVA and transfer to [facility]. Required Rt decompressive craniectomy and remained on vent post op.ETT 8/23-9/5, s/p tracheostomy 9/5.  Subjective: pt eager for POs Assessment / Plan / Recommendation CHL IP CLINICAL  IMPRESSIONS 05/05/2017 Clinical Impression  Pt demonstrates cognitive impairment impacting swallow function. Prolonged liangual rocking and pumping occurs with most boluses, particulalry purees, though a liquid wash is beneficial to facilitate oral transit. Swallow response is delayed reuslting in flash penetration events. Otherwise oropharyngeal mechanism is WNL. Pt may initaite a puree/thin diet though intake may be poor and assistance will be needed for attention with feeding and to avoid prolonged oral holding. PMSV to be in place with all PO. WIll f/u for tolerance.  SLP Visit Diagnosis Dysphagia, oropharyngeal phase (R13.12) Attention and concentration deficit following -- Frontal lobe and executive function deficit following -- Impact on safety and function --   CHL IP TREATMENT RECOMMENDATION 05/05/2017 Treatment Recommendations Therapy as outlined in treatment plan below   Prognosis 05/05/2017 Prognosis for Safe Diet Advancement Good Barriers to Reach Goals -- Barriers/Prognosis Comment -- CHL IP DIET RECOMMENDATION 05/05/2017 SLP Diet Recommendations Thin liquid;Dysphagia 1 (Puree) solids Liquid Administration via Cup;Straw Medication Administration Crushed with puree Compensations Minimize environmental distractions;Follow solids with liquid Postural Changes Seated upright at 90 degrees   CHL IP OTHER RECOMMENDATIONS 05/05/2017 Recommended Consults -- Oral Care Recommendations Other (Comment);Oral care BID Other Recommendations Have oral suction available;Place PMSV during PO intake   CHL IP FOLLOW UP RECOMMENDATIONS 05/05/2017 Follow up Recommendations Skilled Nursing facility   [facility] IP FREQUENCY AND DURATION 05/05/2017 Speech Therapy Frequency (ACUTE ONLY) min 2x/week Treatment Duration 2 weeks      CHL IP ORAL PHASE 05/05/2017 Oral Phase Impaired Oral - Pudding Teaspoon -- Oral - Pudding Cup -- Oral - Honey  Teaspoon NT Oral - Honey Cup -- Oral - Nectar Teaspoon -- Oral - Nectar Cup -- Oral - Nectar Straw NT  Oral - Thin Teaspoon NT Oral - Thin Cup -- Oral - Thin Straw Holding of bolus;Lingual pumping Oral - Puree Lingual pumping;Holding of bolus Oral - Mech Soft -- Oral - Regular Lingual pumping;Holding of bolus Oral - Multi-Consistency -- Oral - Pill -- Oral Phase - Comment --  CHL IP PHARYNGEAL PHASE 05/05/2017 Pharyngeal Phase Impaired Pharyngeal- Pudding Teaspoon -- Pharyngeal -- Pharyngeal- Pudding Cup -- Pharyngeal -- Pharyngeal- Honey Teaspoon NT Pharyngeal -- Pharyngeal- Honey Cup -- Pharyngeal -- Pharyngeal- Nectar Teaspoon -- Pharyngeal -- Pharyngeal- Nectar Cup -- Pharyngeal -- Pharyngeal- Nectar Straw NT Pharyngeal -- Pharyngeal- Thin Teaspoon NT Pharyngeal -- Pharyngeal- Thin Cup -- Pharyngeal -- Pharyngeal- Thin Straw Delayed swallow initiation-pyriform sinuses;Penetration/Aspiration during swallow Pharyngeal Material enters airway, remains ABOVE vocal cords then ejected out;Material does not enter airway Pharyngeal- Puree Delayed swallow initiation-pyriform sinuses Pharyngeal -- Pharyngeal- Mechanical Soft Delayed swallow initiation-pyriform sinuses Pharyngeal -- Pharyngeal- Regular -- Pharyngeal -- Pharyngeal- Multi-consistency -- Pharyngeal -- Pharyngeal- Pill -- Pharyngeal -- Pharyngeal Comment --  CHL IP CERVICAL ESOPHAGEAL PHASE 04/28/2017 Cervical Esophageal Phase WFL Pudding Teaspoon -- Pudding Cup -- Honey Teaspoon -- Honey Cup -- Nectar Teaspoon -- Nectar Cup -- Nectar Straw -- Thin Teaspoon -- Thin Cup -- Thin Straw -- Puree -- Mechanical Soft -- Regular -- Multi-consistency -- Pill -- Cervical Esophageal Comment -- No flowsheet data found. DeBlois, Katherene Ponto 05/05/2017, 3:25 PM              Dg Swallowing Func-speech Pathology  Result Date: 04/28/2017 Objective Swallowing Evaluation: Type of Study: MBS-Modified Barium Swallow Study Patient Details Name: Sung Parodi MRN: [id number] Date of Birth: [birth year] Today's Date: 04/28/2017 Time: SLP Start Time (ACUTE ONLY): 0939-SLP Stop Time  (ACUTE ONLY): 1003 SLP Time Calculation (min) (ACUTE ONLY): 24 min Past Medical History: No past medical history on file. Past Surgical History: Past Surgical History: Procedure Laterality Date . CRANIECTOMY Right 04/01/2017  Procedure: RIGHT DECOMPRESSIVE CRANIECTOMY;  Surgeon: Ditty, Kevan Ny, MD;  Location: [place];  Service: Neurosurgery;  Laterality: Right; . ESOPHAGOGASTRODUODENOSCOPY N/A 04/13/2017  Procedure: ESOPHAGOGASTRODUODENOSCOPY (EGD);  Surgeon: Georganna Skeans, MD;  Location: [place];  Service: General;  Laterality: N/A;  bedside . PEG PLACEMENT N/A 04/13/2017  Procedure: PERCUTANEOUS ENDOSCOPIC GASTROSTOMY (PEG) PLACEMENT;  Surgeon: Georganna Skeans, MD;  Location: [facility] ENDOSCOPY;  Service: General;  Laterality: N/A; HPI: 56 yo male brought to [facility] with altered mental status from DKA and sepsis. Found to have RT MCA CVA and transfer to [facility]. Required Rt decompressive craniectomy and remained on vent post op.ETT 8/23-9/5, s/p tracheostomy 9/5.  Subjective: pt eager for POs Assessment / Plan / Recommendation CHL IP CLINICAL IMPRESSIONS 04/28/2017 Clinical Impression Pt has a severe oral and moderate pharyngeal dysphagia. Significant oral holding and anteropr/posterior rocking is observed, especially with thicker textures. Most of the puree was orally suctioned after not being able to initiate a/p transfer given Max cues, significant extra time, and small amounts of honey thick liquids as a liquid wash. Small amounts had already fallen posteriorly to the pharynx without a swallow triggered. He moved thin and nectar thick liquids more readily into his pharynx, but a delay in swallow and incomplete glottal closure allowed for silent aspiration before/druing the swallow.  Pt will not cough to command because it makes his head hurt. Small spoonfuls of thin liquid, even after pooling in his pyriform sinuses, did not  enter the airway. Recommend allowing a few ice chips, given one at a time by staff,  following oral care to provide pt comfort and increase use of swallowing musculature. Would not leave patient with a cup of ice as he would be impulsive with his intake and has trouble consistently initiating a swallow. SLP will continue to follow. SLP Visit Diagnosis Dysphagia, oropharyngeal phase (R13.12) Attention and concentration deficit following -- Frontal lobe and executive function deficit following -- Impact on safety and function Moderate aspiration risk;Severe aspiration risk;Risk for inadequate nutrition/hydration   CHL IP TREATMENT RECOMMENDATION 04/28/2017 Treatment Recommendations Therapy as outlined in treatment plan below   Prognosis 04/28/2017 Prognosis for Safe Diet Advancement Good Barriers to Reach Goals Severity of deficits Barriers/Prognosis Comment -- CHL IP DIET RECOMMENDATION 04/28/2017 SLP Diet Recommendations NPO;Ice chips PRN after oral care Liquid Administration via -- Medication Administration Via alternative means Compensations -- Postural Changes --   CHL IP OTHER RECOMMENDATIONS 04/28/2017 Recommended Consults -- Oral Care Recommendations Oral care QID;Other (Comment) Other Recommendations Have oral suction available   CHL IP FOLLOW UP RECOMMENDATIONS 04/28/2017 Follow up Recommendations Skilled Nursing facility   [facility] IP FREQUENCY AND DURATION 04/28/2017 Speech Therapy Frequency (ACUTE ONLY) min 2x/week Treatment Duration 2 weeks      CHL IP ORAL PHASE 04/28/2017 Oral Phase Impaired Oral - Pudding Teaspoon -- Oral - Pudding Cup -- Oral - Honey Teaspoon Left anterior bolus loss;Weak lingual manipulation;Reduced posterior propulsion;Holding of bolus;Lingual/palatal residue;Delayed oral transit;Decreased bolus cohesion Oral - Honey Cup -- Oral - Nectar Teaspoon -- Oral - Nectar Cup -- Oral - Nectar Straw Left anterior bolus loss;Weak lingual manipulation;Reduced posterior propulsion;Holding of bolus;Lingual/palatal residue;Delayed oral transit;Decreased bolus cohesion Oral - Thin Teaspoon  Left anterior bolus loss;Weak lingual manipulation;Reduced posterior propulsion;Holding of bolus;Lingual/palatal residue;Delayed oral transit;Decreased bolus cohesion Oral - Thin Cup -- Oral - Thin Straw Left anterior bolus loss;Weak lingual manipulation;Reduced posterior propulsion;Holding of bolus;Lingual/palatal residue;Delayed oral transit;Decreased bolus cohesion Oral - Puree Left anterior bolus loss;Weak lingual manipulation;Reduced posterior propulsion;Holding of bolus;Lingual/palatal residue;Delayed oral transit;Decreased bolus cohesion Oral - Mech Soft -- Oral - Regular -- Oral - Multi-Consistency -- Oral - Pill -- Oral Phase - Comment --  CHL IP PHARYNGEAL PHASE 04/28/2017 Pharyngeal Phase Impaired Pharyngeal- Pudding Teaspoon -- Pharyngeal -- Pharyngeal- Pudding Cup -- Pharyngeal -- Pharyngeal- Honey Teaspoon Delayed swallow initiation-pyriform sinuses;Reduced airway/laryngeal closure Pharyngeal -- Pharyngeal- Honey Cup -- Pharyngeal -- Pharyngeal- Nectar Teaspoon -- Pharyngeal -- Pharyngeal- Nectar Cup -- Pharyngeal -- Pharyngeal- Nectar Straw Delayed swallow initiation-pyriform sinuses;Reduced airway/laryngeal closure;Penetration/Aspiration during swallow Pharyngeal Material enters airway, passes BELOW cords without attempt by patient to eject out (silent aspiration) Pharyngeal- Thin Teaspoon Delayed swallow initiation-pyriform sinuses;Reduced airway/laryngeal closure Pharyngeal -- Pharyngeal- Thin Cup -- Pharyngeal -- Pharyngeal- Thin Straw Delayed swallow initiation-pyriform sinuses;Reduced airway/laryngeal closure;Penetration/Aspiration before swallow Pharyngeal Material enters airway, passes BELOW cords without attempt by patient to eject out (silent aspiration) Pharyngeal- Puree Delayed swallow initiation-pyriform sinuses;Reduced airway/laryngeal closure Pharyngeal -- Pharyngeal- Mechanical Soft -- Pharyngeal -- Pharyngeal- Regular -- Pharyngeal -- Pharyngeal- Multi-consistency -- Pharyngeal --  Pharyngeal- Pill -- Pharyngeal -- Pharyngeal Comment --  CHL IP CERVICAL ESOPHAGEAL PHASE 04/28/2017 Cervical Esophageal Phase WFL Pudding Teaspoon -- Pudding Cup -- Honey Teaspoon -- Honey Cup -- Nectar Teaspoon -- Nectar Cup -- Nectar Straw -- Thin Teaspoon -- Thin Cup -- Thin Straw -- Puree -- Mechanical Soft -- Regular -- Multi-consistency -- Pill -- Cervical Esophageal Comment -- No flowsheet data found. [name] 04/28/2017, 11:05 AM  [name], M.A. CCC-SLP [phone]  Lab Data:  CBC:  Recent Labs Lab 05/17/17 0320 05/20/17 0340 05/21/17 0610 05/22/17 0459  WBC 14.7* 15.2* 11.6* 11.6*  HGB 12.3* 13.9 13.5 12.7*  HCT 40.9 46.2 45.8 41.3  MCV 92.7 92.6 92.9 92.6  PLT 245 239 215 774   Basic Metabolic Panel:  Recent Labs Lab 05/17/17 0320 05/20/17 0340 05/21/17 0610 05/22/17 0459  NA 137 137 136 135  K 4.4 4.6 4.3 4.3  CL 99* 102 99* 100*  CO2 25 26 30 26   GLUCOSE 109* 127* 124* 138*  BUN 29* 29* 31* 29*  CREATININE 0.75 0.65 0.77 0.71  CALCIUM 10.9* 11.1* 10.8* 10.3   GFR: Estimated Creatinine Clearance: 106.5 mL/min (by C-G formula based on SCr of 0.71 mg/dL). Liver Function Tests: No results for input(s): AST, ALT, ALKPHOS, BILITOT, PROT, ALBUMIN in the last 168 hours. No results for input(s): LIPASE, AMYLASE in the last 168 hours. No results for input(s): AMMONIA in the last 168 hours. Coagulation Profile: No results for input(s): INR, PROTIME in the last 168 hours. Cardiac Enzymes: No results for input(s): CKTOTAL, CKMB, CKMBINDEX, TROPONINI in the last 168 hours. BNP (last 3 results) No results for input(s): PROBNP in the last 8760 hours. HbA1C: No results for input(s): HGBA1C in the last 72 hours. CBG:  Recent Labs Lab 05/22/17 1159 05/22/17 1711 05/22/17 2138 05/23/17 0619 05/23/17 1202  GLUCAP 163* 113* 126* 127* 154*   Lipid Profile: No results for input(s): CHOL, HDL, LDLCALC, TRIG, CHOLHDL, LDLDIRECT in the last  72 hours. Thyroid Function Tests: No results for input(s): TSH, T4TOTAL, FREET4, T3FREE, THYROIDAB in the last 72 hours. Anemia Panel: No results for input(s): VITAMINB12, FOLATE, FERRITIN, TIBC, IRON, RETICCTPCT in the last 72 hours. Urine analysis:    Component Value Date/Time   COLORURINE YELLOW 05/02/2017 1353   APPEARANCEUR CLOUDY (A) 05/02/2017 1353   LABSPEC 1.017 05/02/2017 1353   PHURINE 7.0 05/02/2017 1353   GLUCOSEU NEGATIVE 05/02/2017 1353   HGBUR NEGATIVE 05/02/2017 1353   BILIRUBINUR NEGATIVE 05/02/2017 1353   KETONESUR NEGATIVE 05/02/2017 1353   PROTEINUR 100 (A) 05/02/2017 1353   NITRITE NEGATIVE 05/02/2017 1353   LEUKOCYTESUR NEGATIVE 05/02/2017 1353     Ripudeep Rai M.D. [facility] 05/23/2017, 12:15 PM  Pager: [phone] Between 7am to 7pm - call Pager - [phone]  After 7pm go to [email or web address] - password [email or web address]  Call night coverage person covering after 7pm

## 2017-05-23 NOTE — Progress Notes (Signed)
Occupational Therapy Treatment Patient Details Name: Joe Wilkinson MRN: [id number] DOB: 1961/01/08 Today's Date: 05/23/2017    History of present illness 56 yo male brought to [facility] with altered mental status from DKA and sepsis.  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  Required Rt decompressive craniectomy and remained on vent post op.ETT 8/23-9/5, s/p tracheostomy 9/5   OT comments  Pt with decreased participation in session today. Pt stating that he doesn't want to move because of pain on his L side. Pt performing rolling to don abdominal binder with Max A +2. Pt self feeding ice cream at bed level with ice cream placed at midline and on L side. Continue to recommend dc with post-acute OT and will continue to follow acutely to facilitate safe dc.    Follow Up Recommendations  SNF;Supervision/Assistance - 24 hour    Equipment Recommendations   (Defer to next venue)    Recommendations for Other Services      Precautions / Restrictions Precautions Precautions: Fall;Other (comment) Precaution Comments: craniectomy, no bone flap right side; trach in place; peg tube; L side neglect Restrictions Weight Bearing Restrictions: No Other Position/Activity Restrictions: craniectomy, no bone flap right side       Mobility Bed Mobility Overal bed mobility: Needs Assistance Bed Mobility: Rolling Rolling: Max assist;+2 for physical assistance         General bed mobility comments: Max A +2 to roll side to side and don abdominal binder   Transfers                      Balance                                           ADL either performed or assessed with clinical judgement   ADL Overall ADL's : Needs assistance/impaired Eating/Feeding: Minimal assistance;Cueing for sequencing;Bed level Eating/Feeding Details (indicate cue type and reason): Pt eating ice cream with it placed at midline or at L side.                                     General ADL Comments: Pt declining all ADLs and transfer. Pt requiring increased encouragement to participate. Pt repeating that he did not want to move because he was in pain. Pt reporting pain at his head, L arm, and L leg     Vision   Vision Assessment?: Vision impaired- to be further tested in functional context   Perception     Praxis      Cognition Arousal/Alertness: Awake/alert Behavior During Therapy: Flat affect;Agitated Overall Cognitive Status: Impaired/Different from baseline Area of Impairment: Problem solving;Awareness;Safety/judgement;Following commands;Attention;Memory                   Current Attention Level: Sustained Memory: Decreased recall of precautions;Decreased short-term memory Following Commands: Follows one step commands with increased time;Follows one step commands inconsistently;Follows multi-step commands inconsistently;Follows multi-step commands with increased time Safety/Judgement: Decreased awareness of safety;Decreased awareness of deficits Awareness: Intellectual Problem Solving: Slow processing;Decreased initiation;Difficulty sequencing;Requires verbal cues;Requires tactile cues General Comments: Pt demonstrating increased agitation today due to pain. Pt presenting with increased confusion stating that OT came yesterday and was more agitated when told OT did not visit yesterday. Pt requiring Max cues to look to L and attend  to L side.         Exercises     Shoulder Instructions       General Comments Pt LUE with increased edema. Positioned with pillows to elevate. Pt reporting increase pain that is more signfificant than prior sessions.    Pertinent Vitals/ Pain       Pain Assessment: Faces Faces Pain Scale: Hurts even more Pain Location: Left leg and hand; head Pain Descriptors / Indicators: Aching;Tender;Guarding;Grimacing Pain Intervention(s): Monitored during session;Limited activity within patient's  tolerance;Repositioned  Home Living                                          Prior Functioning/Environment              Frequency  Min 2X/week        Progress Toward Goals  OT Goals(current goals can now be found in the care plan section)  Progress towards OT goals: Progressing toward goals  Acute Rehab OT Goals Patient Stated Goal: none stated OT Goal Formulation: Patient unable to participate in goal setting Time For Goal Achievement: 05/30/17 Potential to Achieve Goals: Good ADL Goals Pt Will Perform Grooming: with min assist;sitting Additional ADL Goal #1: Pt will perform bed mobility as precursor to ADL at min A level Additional ADL Goal #2: Pt will track items with multimodal cues past mdline 50% of the time Additional ADL Goal #3: Pt will tolerate sitting EOB with min A as precursor to performing ADL  Plan Discharge plan remains appropriate    Co-evaluation                 AM-PAC PT "6 Clicks" Daily Activity     Outcome Measure   Help from another person eating meals?: A Lot Help from another person taking care of personal grooming?: A Lot Help from another person toileting, which includes using toliet, bedpan, or urinal?: Total Help from another person bathing (including washing, rinsing, drying)?: Total Help from another person to put on and taking off regular upper body clothing?: A Lot Help from another person to put on and taking off regular lower body clothing?: Total 6 Click Score: 9    End of Session Equipment Utilized During Treatment: Oxygen  OT Visit Diagnosis: Unsteadiness on feet (R26.81);Muscle weakness (generalized) (M62.81);Low vision, both eyes (H54.2);Other symptoms and signs involving the nervous system (R29.898);Other symptoms and signs involving cognitive function;Hemiplegia and hemiparesis;Pain Hemiplegia - Right/Left: Left Hemiplegia - dominant/non-dominant: Non-Dominant Hemiplegia - caused by: Cerebral  infarction Pain - Right/Left: Right Pain - part of body:  (Head)   Activity Tolerance Patient limited by pain   Patient Left in bed;with call bell/phone within reach;with bed alarm set   Nurse Communication Mobility status        Time: 2409-7353 OT Time Calculation (min): 33 min  Charges: OT General Charges $OT Visit: 1 Visit OT Treatments $Self Care/Home Management : 23-37 mins  [place], OTR/L Acute Rehab Pager: [phone] Office: [place] 05/23/2017, 5:39 PM

## 2017-05-23 NOTE — Plan of Care (Signed)
Problem: Pain Managment: Goal: General experience of comfort will improve Outcome: Progressing Pt repositioned for comfort and he was able to make verbal requests that improved his comfort.  Will continue to monitor for safety.

## 2017-05-24 DIAGNOSIS — T17908A Unspecified foreign body in respiratory tract, part unspecified causing other injury, initial encounter: Secondary | ICD-10-CM

## 2017-05-24 LAB — GLUCOSE, CAPILLARY
Glucose-Capillary: 148 mg/dL — ABNORMAL HIGH (ref 65–99)
Glucose-Capillary: 122 mg/dL — ABNORMAL HIGH (ref 65–99)
Glucose-Capillary: 103 mg/dL — ABNORMAL HIGH (ref 65–99)
Glucose-Capillary: 118 mg/dL — ABNORMAL HIGH (ref 65–99)

## 2017-05-24 MED ORDER — GLUCERNA 1.2 CAL PO LIQD
1000.0000 mL | ORAL | Status: DC
  Administered 2017-05-24 – 2017-05-28 (×4): 1000 mL
  Filled 2017-05-24 (×8): qty 1000

## 2017-05-24 MED ORDER — ENSURE ENLIVE PO LIQD
237.0000 mL | Freq: Two times a day (BID) | ORAL | Status: DC
  Administered 2017-05-24 – 2017-05-29 (×9): 237 mL via ORAL

## 2017-05-24 MED ORDER — GLUCERNA 1.2 CAL PO LIQD
780.0000 mL | ORAL | Status: DC
  Filled 2017-05-24: qty 948

## 2017-05-24 NOTE — Progress Notes (Signed)
PULMONARY / CRITICAL CARE MEDICINE   Name: Joe Wilkinson MRN: [id number] DOB: 09-19-1960    ADMISSION DATE:  03/31/2017 CHIEF COMPLAINT:  CVA  HISTORY OF PRESENT ILLNESS:   56 yo male brought to [facility] with altered mental status from DKA and sepsis.  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  Required Rt decompressive craniectomy and remained on vent post op.  SUBJECTIVE:  Controls secretions   VITAL SIGNS: BP 91/69 (BP Location: Right Arm)   Pulse 90   Temp 98 F (36.7 C) (Oral)   Resp 18   Ht 5\' 10"  (1.778 m)   Wt 184 lb 9.6 oz (83.7 kg)   SpO2 99%   BMI 26.49 kg/m   INTAKE / OUTPUT: I/O last 3 completed shifts: In: 3668.8 [NG/GT:3668.8] Out: 4251 [Urine:4250; Stool:1]  PHYSICAL EXAMINATION:   General:  NAd male on t collar HEENT: cranial deformity noted, #4 cuffless trach with pmc notred PSY: nl affect Neuro: left sided weakness CV: s1s2 rrr, no m/r/g PULM: even/non-labored, lungs bilaterally decreased  in bases CZ:YSAY, non-tender, bsx4 active , peg Extremities: warm/dry, +edema  Skin: no rashes or lesions   BMET  Recent Labs Lab 05/20/17 0340 05/21/17 0610 05/22/17 0459  NA 137 136 135  K 4.6 4.3 4.3  CL 102 99* 100*  CO2 26 30 26   BUN 29* 31* 29*  CREATININE 0.65 0.77 0.71  GLUCOSE 127* 124* 138*    Electrolytes  Recent Labs Lab 05/20/17 0340 05/21/17 0610 05/22/17 0459  CALCIUM 11.1* 10.8* 10.3    CBC  Recent Labs Lab 05/20/17 0340 05/21/17 0610 05/22/17 0459  WBC 15.2* 11.6* 11.6*  HGB 13.9 13.5 12.7*  HCT 46.2 45.8 41.3  PLT 239 215 228    Glucose  Recent Labs Lab 05/22/17 2138 05/23/17 0619 05/23/17 1202 05/23/17 1711 05/23/17 2045 05/24/17 0606  GLUCAP 126* 127* 154* 99 105* 148*    Imaging No results found.  STUDIES:  LP 8/24 >> glucose 205, RBC 16, protein 62, WBC 5 CT head 8/24 >> Large Rt MCA EEG 8/27 >> bilateral cerebral dysfunction, non-specific in etiology Echo 8/27 >> EF 65 to 70% CT  abd/pelvis 8/30 >> third spacing, 3.5 cm low attenuation Lt hepatic lobe CT Head 9/3>> Severe right hemisphere cytotoxic edema with stable extension of edematous brain through the right craniectomy defect. Stable petechial hemorrhage without malignant hemorrhagicTransformation. Stable intracranial mass effect with leftward midline shift of 7 mm. Small left PCA territory infarct. CXR 9/23 > LLL atx and small effusion Barrium swallow 9/21 > severe oral and mod pharyngeal dysphagia; mod to severe aspiration risk SLP 10/4 >> tolerating PMV, pocketing food on left  CULTURES: CSF 8/24>> negative BCX 8/24>> negative UCX 8/24>> negative Sputum 8/24>> S. Agalactiae Sputum 9/1>> Consistent with normal resp. Flora UC 9/25 >> klebsiella (S-rocephin, imipenem), Ecoli (S- rocephin, imipenem)  ANTIBIOTICS: vanco 8/24 >> 8/26 Zosyn 8/24 >> 8/26, 9/1 >>9/8  Cleocin 9/18>>>9/22 Ampicillin 8/27 > 8/30  SIGNIFICANT EVENTS: 8/23 Admit [facility] 8/23 8/24 Transfer to [facility] after Rt MCA CVA, start 3% NS 8/25 Decompressive craniectomy 8/27 Transfuse 1 unit PRBC 8/28 Transfuse 1 unit PRBC, A fib with RVR 8/30 Transfuse 1 unit PRBC 8/31 Off pressors, reintubated x 2, transfuse PRBC 9/5 Trach 9/6 PEG 9/14 Trach exchanged from 8 cuffed to 6 cuffless 9/22 Trach exchanged to 4 cuffless 10/01 Tolerating PMV, ATC 28% 10/04 ATC 28%, minimal secretions, tolerating PMV 10/17 ready for decannulation   LINES/TUBES: ETT 8/23 >>9/5 Trach (JY)  9/5 >> PEG 9/6 >>  ASSESSMENT / PLAN:  Acute on chronic hypoxemic respiratory failure s/p trach  Small pleural effusion - resolved as of CXR 10/6 Plan: Controls secretions with cough and self suction. He is ready for decannulation if Dr. Titus Mould agrees   Will continue to follow till decannulation  Continue PM valve during the day  [place] PCCM Pager [phone] till 3 pm If no answer page [phone] 05/24/2017, 8:06 AM   .STAFF NOTE: I, Merrie Roof, MD FACP have personally reviewed patient's available data, including medical history, events of note, physical examination and test results as part of my evaluation. I have discussed with resident/NP and other care providers such as pharmacist, RN and RRT. In addition, I personally evaluated patient and elicited key findings of: awake, good spirits, strong cough, PMV successful and all day, he eats and is successful at coughing secretions through his mouth, no distress, last pcxr without infiltrate, he is stronger, would wan to dc trach and place guaze and observe.  Lavon Paganini. Titus Mould, MD, [place] Pgr: [facility] 05/24/2017 3:09 PM

## 2017-05-24 NOTE — Progress Notes (Signed)
Nutrition Follow-up  DOCUMENTATION CODES:   Not applicable  INTERVENTION:   -Add Ensure Enlive po BID, each supplement provides 350 kcal and 20 grams of protein  -Changed TF regimen to nocturnal feeds. Plan to infuse over 12 hours daily from 7pm tp 7am daily. Continue Glucerna 1.2 @ rate of 65 ml/hr for 12 hours daily. Continue Pro-Stat BID (TF regimen provides total of 1136 kcals, 77 g of protein).   -Feeding assistance as needed   NUTRITION DIAGNOSIS:   Biting/chewing difficulty related to dysphagia as evidenced by  (Dysphagia 1 diet, need for TF).  Improving  GOAL:   Patient will meet greater than or equal to 90% of their needs  Progressing  MONITOR:   TF tolerance, PO intake, Weight trends, Labs, Skin, I & O's  REASON FOR ASSESSMENT:   Consult Enteral/tube feeding initiation and management  ASSESSMENT:   Pt admitted to [facility] with AMS from DKA and sepsis, found to have R MCA CVA with 12 mm midline shift and transferred to [facility], required R decompressive craniectomy.   Tolerating trach collar, utilizing PMV. Noted plan for decannulation. Pt sitting up in chair on visit today  Pt reports his appetite is good; reports he is not eating well as he does not like the foods being sent to him on a Dysphagia II diet; reports it is the same things over and over again. Noted diet just advanced to Dysphagia III today which should allow more food options from menu. Recorded po intake 40%; pt also reports he likes Ensure shakes (none ordered at present)  Tolerating Glucerna 1.2 @ 56ml/hr for 24 hours with Pro-Stat BID via G-tube  Pt agreeable to changing to nighttime feedings and agreeable to addition of Ensure Enlive in hopes of weaning TF off by promoting oral intake  Diet Order:  DIET DYS 3 Room service appropriate? Yes; Fluid consistency: Thin  Skin:  Wound (see comment) (perianal cellulitis, no pressure ulcers)  Last BM:  10/16  Height:   Ht Readings from Last 1  Encounters:  04/26/17 5\' 10"  (1.778 m)    Weight:   Wt Readings from Last 1 Encounters:  05/12/17 184 lb 9.6 oz (83.7 kg)    Ideal Body Weight:  75.45 kg  BMI:  Body mass index is 26.49 kg/m.  Estimated Nutritional Needs:   Kcal:  2100-2300  Protein:  115-130 gm  Fluid:  2.1-2.3 L  EDUCATION NEEDS:   No education needs identified at this time  [place], [place], LDN [phone] Pager  [phone] Weekend/On-Call Pager

## 2017-05-24 NOTE — Procedures (Signed)
Tracheostomy Change Note  Patient Details:   Name: Nikolaos Maddocks DOB: [birth year] MRN: [id number]    Airway Documentation:     Evaluation  O2 sats: stable throughout Complications: No apparent complications Patient did tolerate procedure well. Bilateral Breath Sounds: Diminished, Clear     Trach removed per MD order. Sats 97% RA with no distress noted. Foam dressing placed over stoma for protection.   Elwin Mocha 05/24/2017, 4:23 PM

## 2017-05-24 NOTE — Progress Notes (Signed)
PROGRESS NOTE    Joe Wilkinson  WUJ:[id number] DOB: [birth year] DOA: 03/31/2017 PCP: Patient, No Pcp Per   No chief complaint on file.   Brief Narrative:  HPI on 04/01/2017 by Dr. Hanley Hays (PCCM) [phone] M found unresponsive at bustop admitted to [facility] in DKA, concern for sepsis w/o source, later developed hemiparesis and found to have acute Right MCA CVA.  Now transferred to [facility] ICU for NeuroSx care, seen by NSX and Neuro.  Tentative plan for crani in AM. He is intubated and sedated and unable to provide any HPI, ROS, PMH, PSH, FH, home meds, allergies.  All Hx form medical record.  Interim history  He was transferred to the ICU and underwent decompressive right craniectomy 8/25. Hypertonic saline protocol was completed. He required transfusion support and was treated for AFib with RVR, continued on the ventilator, eventually receiving trach 9/5 and PEG 9/6, on ATC 9/7. Patient does not have health insurance and because of his craniectomy without the ability to fit a helmet, most skilled nursing facilities areunwilling to accept him.  Currently he is getting meds thru PEG and continuous TF. Heparin was converted to prophylaxis Lovenox. He was transferred from [facility] service to [facility] service on 9/25. Currently awaiting SNF placement. PCCM continues to follow patient, getting closer to decannulation of the trach.   Assessment & Plan   Acute respiratory failure with hypoxia and hypercarbia  -Continue pulmonary hygiene  -s/p trach -Continue ATC as tolerated -PCCM continues to follow patient, pending recommendations regarding decannulation  Septic shock secondary to S. 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 Leonie Man, recommended anticoagulation (FOBT negative), started on Eliquis  -LDL 38, hemoglobin A1C 14.3 -Repeat CT had showed further swelling in the region of the complete right MCA territory infarction with more extensive bulging of the brain through the craniectomy defect. No midline shift however because of decompressive craniotomy. At superior margin, there could be some extension of the swollen brain through a dural defect. At the superior margin, small amount of petechial bleeding within the region of the infarction. -PT/OT rec SNF  Atrial fibrillation with RVR, new onset -Currently in sinus rhythm -CHADSVASC 4 -Patient was not placed on anticoagulation as he was found to be anemic and required 4 units PRBC transfusion this admission -Continue Eliquis and metoprolol  Diabetes mellitus, type II -Hemoglobin A1c 14.3 -Continue Levemir, insulin sliding scale CBG monitoring  Anemia blood loss -Unclear source -Abdominal x-ray does not show bleeding, FOBT negative -Hemoglobin 11.9, appears to be stable of the past several weeks -Continue to monitor CBC periodically   Escherichia coli, Klebsiella UTI -Completed 10 days of keflex on 05/15/2017  Nutrition/dysphagia -Speech therapy recommended dysphagia 1 diet, patient currently on tube feeds -Patient did have some abdominal distention on 05/06/2017, abdominal film does not show ileus or obstruction -Continue to monitor  Peri-anal cellulitis with small abscess -Gen. surgery consulted and appreciated, determined neurosurgical drainage necessary -Patient was on clindamycin through 04/29/2017  DVT Prophylaxis  Eliquis   Code Status: Full  Family Communication: None at bedside  Disposition Plan: Admitted,  pending SNF  Consultants PCCM Neurology Neurosurgery General surgery   Procedures  -LP 8/24 >> glucose 205, RBC 16, protein 62, WBC 5 -CT head 8/24 >> Large Rt MCA -8/25 decompressive craniectomy -EEG 8/27 >> bilateral cerebral dysfunction, non-specific in etiology -Echo 8/27 >> EF 65 to 70% -CT abd/pelvis 8/30 >> third spacing, 3.5 cm low attenuation Lt hepatic lobe -CT Head 9/3>> Severe right hemisphere cytotoxic edema with stable extension of edematous brain through the right craniectomy defect. Stable petechial hemorrhage without malignant hemorrhagicTransformation. Stable intracranial mass effect with leftward midline shift of 7 mm. Small left PCA territory infarct. -9/5 Trach placement.  -9/6 PEG tube placement.  -CXR 9/23 > LLL atx and small effusion -Barrium swallow 9/21 > severe oral and mod pharyngeal dysphagia; mod to severe aspiration risk -CT head on 10/1 shows Further swelling in the region of the complete right MCA territory infarction with more extensive bulging of the brain through the craniectomy defect. No midline shift.   Antibiotics   Anti-infectives    Start     Dose/Rate Route Frequency Ordered Stop   05/05/17 2200  cephALEXin (KEFLEX) capsule 500 mg     500 mg Oral Every 12 hours 05/05/17 1640 05/15/17 1042   04/26/17 0600  vancomycin (VANCOCIN) IVPB 750 mg/150 ml premix  Status:  Discontinued     750 mg 150 mL/hr over 60 Minutes Intravenous Every 12 hours 04/25/17 1729 04/25/17 1730   04/25/17 1830  clindamycin (CLEOCIN) capsule 300 mg     300 mg Oral Every 6 hours 04/25/17 1730 04/29/17 1717   04/25/17 1800  vancomycin (VANCOCIN) IVPB 750 mg/150 ml premix  Status:  Discontinued     750 mg 150 mL/hr over 60 Minutes Intravenous Every 12 hours 04/25/17 1728 04/25/17 1729   04/23/17 0945  piperacillin-tazobactam (ZOSYN) IVPB 3.375 g  Status:  Discontinued     3.375 g 12.5 mL/hr over 240 Minutes Intravenous Every 8 hours 04/23/17 0930 04/25/17 1730    04/23/17 0430  vancomycin (VANCOCIN) IVPB 1000 mg/200 mL premix  Status:  Discontinued     1,000 mg 200 mL/hr over 60 Minutes Intravenous Every 12 hours 04/22/17 1655 04/25/17 1728   04/22/17 1630  vancomycin (VANCOCIN) IVPB 1000 mg/200 mL premix     1,000 mg 200 mL/hr over 60 Minutes Intravenous  Once 04/22/17 1627 04/22/17 1826   04/08/17 0200  piperacillin-tazobactam (ZOSYN) IVPB 3.375 g  Status:  Discontinued     3.375 g 12.5 mL/hr over 240 Minutes Intravenous Every 8 hours 04/08/17 0058 04/15/17 1601   04/08/17 0100  piperacillin-tazobactam (ZOSYN) IVPB 3.375 g  Status:  Discontinued     3.375 g 100 mL/hr over 30 Minutes Intravenous Every 6 hours 04/08/17 0054 04/08/17 0057   04/03/17 1200  ampicillin (OMNIPEN) 2 g in sodium chloride 0.9 % 50 mL IVPB     2 g 150 mL/hr over 20 Minutes Intravenous Every 6 hours 04/03/17 1005 04/06/17 1837   04/01/17 1800  vancomycin (VANCOCIN) IVPB 1000 mg/200 mL premix  Status:  Discontinued     1,000 mg 200 mL/hr over 60 Minutes Intravenous Every 24 hours 04/01/17 0146 04/01/17 0154   04/01/17 1600  ceFAZolin (ANCEF) IVPB 2g/100 mL premix     2 g 200 mL/hr over 30 Minutes Intravenous Every 8 hours 04/01/17 1100 04/02/17 0056   04/01/17 0828  bacitracin 50,000 Units in sodium chloride irrigation 0.9 % 500 mL irrigation  Status:  Discontinued       As needed 04/01/17 0837 04/01/17 0947  04/01/17 0600  piperacillin-tazobactam (ZOSYN) IVPB 3.375 g  Status:  Discontinued     3.375 g 12.5 mL/hr over 240 Minutes Intravenous Every 8 hours 04/01/17 0146 04/01/17 0154   04/01/17 0600  vancomycin (VANCOCIN) IVPB 750 mg/150 ml premix  Status:  Discontinued     750 mg 150 mL/hr over 60 Minutes Intravenous Every 12 hours 04/01/17 0225 04/03/17 0959   04/01/17 0400  piperacillin-tazobactam (ZOSYN) IVPB 3.375 g  Status:  Discontinued     3.375 g 12.5 mL/hr over 240 Minutes Intravenous Every 8 hours 04/01/17 0225 04/03/17 7353      Subjective:   Esai Stecklein seen and examined today.  Continues complaining of headache. Denies testing, shortness breath, abdominal pain, nausea vomiting, diarrhea or constipation. Complains of left-sided weakness and inability to move.  Objective:   Vitals:   05/24/17 0912 05/24/17 0944 05/24/17 1201 05/24/17 1300  BP:  (!) 100/37  123/69  Pulse: (!) 108 (!) 108 94 (!) 102  Resp: 18   18  Temp:    98.3 F (36.8 C)  TempSrc:    Oral  SpO2: 95% 94% 97% 100%  Weight:      Height:        Intake/Output Summary (Last 24 hours) at 05/24/17 1541 Last data filed at 05/24/17 1500  Gross per 24 hour  Intake          2338.75 ml  Output             3501 ml  Net         -1162.25 ml   Filed Weights   04/26/17 0416 04/26/17 2055 05/12/17 1112  Weight: 85.5 kg (188 lb 7.9 oz) 88.7 kg (195 lb 8.8 oz) 83.7 kg (184 lb 9.6 oz)   Exam  General: Well developed, well nourished, NAD, appears stated age  24: NCAT, mucous membranes moist. Right craniectomy   Neck: Trach  Cardiovascular: S1 S2 auscultated, no murmurs, RRR  Respiratory: Clear to auscultation bilaterally with equal chest rise  Abdomen: Soft, nontender, nondistended, + bowel sounds  Extremities: warm dry without cyanosis clubbing or edema  Neuro: AAOx3, no new deficits, left sided weakness  Psych: Appropriate and pleasant    Data Reviewed: I have personally reviewed following labs and imaging studies  CBC:  Recent Labs Lab 05/20/17 0340 05/21/17 0610 05/22/17 0459  WBC 15.2* 11.6* 11.6*  HGB 13.9 13.5 12.7*  HCT 46.2 45.8 41.3  MCV 92.6 92.9 92.6  PLT 239 215 299   Basic Metabolic Panel:  Recent Labs Lab 05/20/17 0340 05/21/17 0610 05/22/17 0459  NA 137 136 135  K 4.6 4.3 4.3  CL 102 99* 100*  CO2 26 30 26   GLUCOSE 127* 124* 138*  BUN 29* 31* 29*  CREATININE 0.65 0.77 0.71  CALCIUM 11.1* 10.8* 10.3   GFR: Estimated Creatinine Clearance: 106.5 mL/min (by C-G formula based on SCr of 0.71 mg/dL). Liver Function  Tests: No results for input(s): AST, ALT, ALKPHOS, BILITOT, PROT, ALBUMIN in the last 168 hours. No results for input(s): LIPASE, AMYLASE in the last 168 hours. No results for input(s): AMMONIA in the last 168 hours. Coagulation Profile: No results for input(s): INR, PROTIME in the last 168 hours. Cardiac Enzymes: No results for input(s): CKTOTAL, CKMB, CKMBINDEX, TROPONINI in the last 168 hours. BNP (last 3 results) No results for input(s): PROBNP in the last 8760 hours. HbA1C: No results for input(s): HGBA1C in the last 72 hours. CBG:  Recent Labs Lab 05/23/17 1202  05/23/17 1711 05/23/17 2045 05/24/17 0606 05/24/17 1157  GLUCAP 154* 99 105* 148* 122*   Lipid Profile: No results for input(s): CHOL, HDL, LDLCALC, TRIG, CHOLHDL, LDLDIRECT in the last 72 hours. Thyroid Function Tests: No results for input(s): TSH, T4TOTAL, FREET4, T3FREE, THYROIDAB in the last 72 hours. Anemia Panel: No results for input(s): VITAMINB12, FOLATE, FERRITIN, TIBC, IRON, RETICCTPCT in the last 72 hours. Urine analysis:    Component Value Date/Time   COLORURINE YELLOW 05/02/2017 1353   APPEARANCEUR CLOUDY (A) 05/02/2017 1353   LABSPEC 1.017 05/02/2017 1353   PHURINE 7.0 05/02/2017 1353   GLUCOSEU NEGATIVE 05/02/2017 1353   HGBUR NEGATIVE 05/02/2017 1353   BILIRUBINUR NEGATIVE 05/02/2017 1353   KETONESUR NEGATIVE 05/02/2017 1353   PROTEINUR 100 (A) 05/02/2017 1353   NITRITE NEGATIVE 05/02/2017 1353   LEUKOCYTESUR NEGATIVE 05/02/2017 1353   Sepsis Labs: @LABRCNTIP (procalcitonin:4,lacticidven:4)  ) No results found for this or any previous visit (from the past 240 hour(s)).    Radiology Studies: No results found.   Scheduled Meds: . acetaminophen (TYLENOL) oral liquid 160 mg/5 mL  650 mg Per Tube TID  . apixaban  5 mg Per Tube BID  . chlorhexidine  15 mL Mouth Rinse BID  . feeding supplement (ENSURE ENLIVE)  237 mL Oral BID BM  . feeding supplement (GLUCERNA 1.2 CAL)  1,000 mL Per  Tube Q24H  . feeding supplement (PRO-STAT SUGAR FREE 64)  30 mL Per Tube BID  . free water  250 mL Per Tube Q8H  . gabapentin  300 mg Oral QHS  . glycopyrrolate  1 mg Per Tube BID  . insulin aspart  0-20 Units Subcutaneous TID WC  . insulin aspart  0-5 Units Subcutaneous QHS  . insulin detemir  34 Units Subcutaneous Daily  . metoprolol tartrate  12.5 mg Per Tube BID  . pantoprazole sodium  40 mg Per Tube Daily   Continuous Infusions:    LOS: 53 days   Time Spent in minutes   20 minutes  Nayleen Janosik D.O. on 05/24/2017 at 3:41 PM  Between 7am to 7pm - Pager - [phone]  After 7pm go to [email or web address] - password [email or web address]  And look for the night coverage person covering for me after hours  [facility] Office  [phone]

## 2017-05-24 NOTE — Progress Notes (Signed)
Pt requesing chopped spagetti and other foods. Inquires about [employer] soft diet. Notified md.

## 2017-05-24 NOTE — Plan of Care (Signed)
Problem: Pain Managment: Goal: General experience of comfort will improve Outcome: Progressing Pt pain being managed with prn Ibuprofen and scheduled tylenol.  Pt complained of headache today but is more able to express discomfort so that repositioning and medications can be given to reduce pain levels.  Pt is currently comfortable and resting in bed.  No issues reported at this time.  Will continue to monitor for safety.

## 2017-05-24 NOTE — Progress Notes (Signed)
Physical Therapy Treatment Patient Details Name: Joe Wilkinson MRN: [id number] DOB: [birth year] Today's Date: 05/24/2017    History of Present Illness 56 yo male brought to [facility] with altered mental status from DKA and sepsis.  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  Required Rt decompressive craniectomy and remained on vent post op.ETT 8/23-9/5, s/p tracheostomy 9/5    PT Comments    Pt con't to have strong L sided neglect, decreased insight to deficits, impaired problem solving, sequencing, balance and mobility. Pt con't to require maXAx2 for all mobility. Acute PT to con't to follow.   Follow Up Recommendations  SNF;Supervision/Assistance - 24 hour     Equipment Recommendations  None recommended by PT    Recommendations for Other Services       Precautions / Restrictions Precautions Precautions: Fall;Other (comment) Precaution Comments: craniectomy, no bone flap right side; trach in place; peg tube; L side neglect Restrictions Weight Bearing Restrictions: No    Mobility  Bed Mobility Overal bed mobility: Needs Assistance Bed Mobility: Rolling Rolling: Max assist;+2 for physical assistance   Supine to sit: Max assist;+2 for physical assistance     General bed mobility comments: max directional verbal and tactile cues to complete task  Transfers Overall transfer level: Needs assistance Equipment used:  (2 person lift with gait belt and chuck pad) Transfers: Sit to/from [employer] Transfers Sit to Stand: Max assist;+2 physical assistance Stand pivot transfers: Max assist;+2 physical assistance;From elevated surface       General transfer comment: L knee blocked, unable to use L UE functionally.. Unable to achieve full upright standing or advance LEs on own  Ambulation/Gait             General Gait Details: unable   Stairs            Wheelchair Mobility    Modified Rankin (Stroke Patients Only) Modified Rankin (Stroke Patients  Only) Pre-Morbid Rankin Score: No symptoms Modified Rankin: Severe disability     Balance Overall balance assessment: Needs assistance Sitting-balance support: Feet supported;Single extremity supported Sitting balance-Leahy Scale: Poor Sitting balance - Comments: worked on trunk facilitation with tactile cues. Pt able to maintain sitting EOB balance with minA and max verbal and tacile cues to maintain midline. Also worked on acknowledging L side due to pt's strong neglect Postural control: Posterior lean   Standing balance-Leahy Scale: Zero Standing balance comment: 2 person mod/max assist to stand and for standing balance with UE support. Tactile facilitation for upright posture                            Cognition Arousal/Alertness: Awake/alert Behavior During Therapy: Restless;Flat affect Overall Cognitive Status: Impaired/Different from baseline Area of Impairment: Orientation;Attention;Memory;Following commands;Safety/judgement;Awareness;Problem solving                 Orientation Level: Disoriented to;Place;Time (stated he was in [facility], aware he had a stroke) Current Attention Level: Focused Memory: Decreased recall of precautions;Decreased short-term memory Following Commands: Follows one step commands inconsistently;Follows one step commands with increased time Safety/Judgement: Decreased awareness of safety;Decreased awareness of deficits Awareness: Intellectual Problem Solving: Slow processing;Decreased initiation;Difficulty sequencing;Requires verbal cues;Requires tactile cues General Comments: pt kept repeating "I want to walk, [name]." despite education on CVA and knowlege of requirment of assist for all mobillity, pt also with L sided neglect      Exercises Other Exercises Other Exercises: PROM to L UE and LE  into flexion. Pt with strong grimacing. Pt with noted increased tightness    General Comments        Pertinent Vitals/Pain Pain  Assessment: Faces Faces Pain Scale: Hurts even more Pain Location: Left leg and hand; head Pain Descriptors / Indicators: Aching;Tender;Guarding;Grimacing Pain Intervention(s): Monitored during session (pt unable to tolerate L UE/LE ROM due to pain)    Home Living                      Prior Function            PT Goals (current goals can now be found in the care plan section) Progress towards PT goals: Progressing toward goals    Frequency    Min 3X/week      PT Plan Current plan remains appropriate    Co-evaluation              AM-PAC PT "6 Clicks" Daily Activity  Outcome Measure  Difficulty turning over in bed (including adjusting bedclothes, sheets and blankets)?: Unable Difficulty moving from lying on back to sitting on the side of the bed? : Unable Difficulty sitting down on and standing up from a chair with arms (e.g., wheelchair, bedside commode, etc,.)?: Unable Help needed moving to and from a bed to chair (including a wheelchair)?: Total Help needed walking in hospital room?: Total Help needed climbing 3-5 steps with a railing? : Total 6 Click Score: 6    End of Session Equipment Utilized During Treatment: Gait belt Activity Tolerance: Patient tolerated treatment well Patient left: in chair;with call bell/phone within reach;with chair alarm set Nurse Communication: Mobility status;Need for lift equipment PT Visit Diagnosis: Hemiplegia and hemiparesis;Other symptoms and signs involving the nervous system (R29.898) Hemiplegia - Right/Left: Left Hemiplegia - dominant/non-dominant: Non-dominant Hemiplegia - caused by: Nontraumatic intracerebral hemorrhage     Time: 1045-1110 PT Time Calculation (min) (ACUTE ONLY): 25 min  Charges:  $Therapeutic Activity: 8-22 mins $Neuromuscular Re-education: 8-22 mins                    G Codes:       Joe Wilkinson, PT, DPT Pager #: [phone] Office #: [phone]    Joe Wilkinson 05/24/2017, 2:38  PM

## 2017-05-25 DIAGNOSIS — J90 Pleural effusion, not elsewhere classified: Secondary | ICD-10-CM

## 2017-05-25 LAB — GLUCOSE, CAPILLARY
GLUCOSE-CAPILLARY: 171 mg/dL — AB (ref 65–99)
GLUCOSE-CAPILLARY: 129 mg/dL — AB (ref 65–99)
Glucose-Capillary: 109 mg/dL — ABNORMAL HIGH (ref 65–99)
Glucose-Capillary: 68 mg/dL (ref 65–99)

## 2017-05-25 MED ORDER — BENZONATATE 100 MG PO CAPS
200.0000 mg | ORAL_CAPSULE | Freq: Three times a day (TID) | ORAL | Status: DC
  Administered 2017-05-25 – 2017-05-29 (×7): 200 mg via ORAL
  Filled 2017-05-25 (×8): qty 2

## 2017-05-25 NOTE — Progress Notes (Signed)
  Speech Language Pathology Treatment: Cognitive-Linquistic  Patient Details Name: Joe Wilkinson MRN: [id number] DOB: [birth year] Today's Date: 05/25/2017 Time: 0121-0143 SLP Time Calculation (min) (ACUTE ONLY): 22 min  Assessment / Plan / Recommendation Clinical Impression  Pt seen while in bed; attempted to engage pt in a card game played on his bedside table to elicit left gaze and sustained attention to task. Pt needed MAX verbal cueing to participate in tx session stating that SLP was "bothering him"; with max cueing, pt demonstrated improved ability to avert gaze to the left. Despite max verbal cues, pt was unwilling to reach to touch the cards more than one time during the session. Suspect that greater success could be reported with increased willingness to participate. Will continue to follow.   HPI HPI: 56 yo male brought to [facility] with altered mental status from DKA and sepsis. Found to have RT MCA CVA and transfer to [facility]. Required Rt decompressive craniectomy and remained on vent post op.ETT 8/23-9/5, s/p tracheostomy 9/5.       SLP Plan  Continue with current plan of care       Recommendations                   Follow up Recommendations: Skilled Nursing facility SLP Visit Diagnosis: Cognitive communication deficit (T59.741) Plan: Continue with current plan of care       [place], Student SLP 05/25/2017, 1:57 PM

## 2017-05-25 NOTE — Progress Notes (Signed)
Pt had Trach removed this afternoon. Pt is doing wells. O2 Sats on RA remain 97%. Pt remains able to talk and has no difficulty with swallow. Will cont to monitor.

## 2017-05-25 NOTE — Progress Notes (Signed)
PROGRESS NOTE    Joe Wilkinson  NID:[id number] DOB: 03/13/1961 DOA: 03/31/2017 PCP: Patient, No Pcp Per   No chief complaint on file.   Brief Narrative:  HPI on 04/01/2017 by Dr. Hanley Hays (PCCM) [id number] M found unresponsive at bustop admitted to [facility] in DKA, concern for sepsis w/o source, later developed hemiparesis and found to have acute Right MCA CVA.  Now transferred to [facility] ICU for NeuroSx care, seen by NSX and Neuro.  Tentative plan for crani in AM. He is intubated and sedated and unable to provide any HPI, ROS, PMH, PSH, FH, home meds, allergies.  All Hx form medical record.  Interim history  He was transferred to the ICU and underwent decompressive right craniectomy 8/25. Hypertonic saline protocol was completed. He required transfusion support and was treated for AFib with RVR, continued on the ventilator, eventually receiving trach 9/5 and PEG 9/6, on ATC 9/7. Patient does not have health insurance and because of his craniectomy without the ability to fit a helmet, most skilled nursing facilities areunwilling to accept him.  Currently he is getting meds thru PEG and continuous TF. Heparin was converted to prophylaxis Lovenox. He was transferred from [facility] service to [facility] service on 9/25. Currently awaiting SNF placement. PCCM continues to follow patient, getting closer to decannulation of the trach.   Assessment & Plan   Acute respiratory failure with hypoxia and hypercarbia  -Continue pulmonary hygiene  -s/p trach -PCCM continues to follow patient, s/p decannulation on 05/24/2017  Septic shock secondary to S. 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 Leonie Man, recommended anticoagulation (FOBT negative), started on Eliquis  -LDL 38, hemoglobin A1C 14.3 -Repeat CT had showed further swelling in the region of the complete right MCA territory infarction with more extensive bulging of the brain through the craniectomy defect. No midline shift however because of decompressive craniotomy. At superior margin, there could be some extension of the swollen brain through a dural defect. At the superior margin, small amount of petechial bleeding within the region of the infarction. -PT/OT rec SNF  Atrial fibrillation with RVR, new onset -Currently in sinus rhythm -CHADSVASC 4 -Patient was not placed on anticoagulation as he was found to be anemic and required 4 units PRBC transfusion this admission -Continue Eliquis and metoprolol  Diabetes mellitus, type II -Hemoglobin A1c 14.3 -Continue Levemir, insulin sliding scale CBG monitoring  Anemia blood loss -Unclear source -Abdominal x-ray does not show bleeding, FOBT negative -Hemoglobin 11.9, appears to be stable of the past several weeks -Continue to monitor CBC periodically   Escherichia coli, Klebsiella UTI -Completed 10 days of keflex on 05/15/2017  Nutrition/dysphagia -Speech therapy recommended dysphagia 1 diet, patient currently on tube feeds -Patient did have some abdominal distention on 05/06/2017, abdominal film does not show ileus or obstruction -Continue to monitor  Peri-anal cellulitis with small abscess -Gen. surgery consulted and appreciated, determined neurosurgical drainage necessary -Patient was on clindamycin through 04/29/2017  DVT Prophylaxis  Eliquis   Code Status: Full  Family Communication: None at bedside  Disposition Plan: Admitted, pending SNF    Consultants PCCM  Neurology Neurosurgery General surgery   Procedures  -LP 8/24 >> glucose 205, RBC 16, protein 62, WBC 5 -CT head 8/24 >> Large Rt MCA -8/25 decompressive craniectomy -EEG 8/27 >> bilateral cerebral dysfunction, non-specific in etiology -Echo 8/27 >> EF 65 to 70% -CT abd/pelvis 8/30 >> third spacing, 3.5 cm low attenuation Lt hepatic lobe -CT Head 9/3>> Severe right hemisphere cytotoxic edema with stable extension of edematous brain through the right craniectomy defect. Stable petechial hemorrhage without malignant hemorrhagicTransformation. Stable intracranial mass effect with leftward midline shift of 7 mm. Small left PCA territory infarct. -9/5 Trach placement.  -9/6 PEG tube placement.  -CXR 9/23 > LLL atx and small effusion -Barrium swallow 9/21 > severe oral and mod pharyngeal dysphagia; mod to severe aspiration risk -CT head on 10/1 shows Further swelling in the region of the complete right MCA territory infarction with more extensive bulging of the brain through the craniectomy defect. No midline shift.  -10/17 Decannulation  Antibiotics   Anti-infectives    Start     Dose/Rate Route Frequency Ordered Stop   05/05/17 2200  cephALEXin (KEFLEX) capsule 500 mg     500 mg Oral Every 12 hours 05/05/17 1640 05/15/17 1042   04/26/17 0600  vancomycin (VANCOCIN) IVPB 750 mg/150 ml premix  Status:  Discontinued     750 mg 150 mL/hr over 60 Minutes Intravenous Every 12 hours 04/25/17 1729 04/25/17 1730   04/25/17 1830  clindamycin (CLEOCIN) capsule 300 mg     300 mg Oral Every 6 hours 04/25/17 1730 04/29/17 1717   04/25/17 1800  vancomycin (VANCOCIN) IVPB 750 mg/150 ml premix  Status:  Discontinued     750 mg 150 mL/hr over 60 Minutes Intravenous Every 12 hours 04/25/17 1728 04/25/17 1729   04/23/17 0945  piperacillin-tazobactam (ZOSYN) IVPB 3.375 g  Status:  Discontinued     3.375 g 12.5 mL/hr over 240 Minutes Intravenous Every 8 hours 04/23/17 0930 04/25/17 1730    04/23/17 0430  vancomycin (VANCOCIN) IVPB 1000 mg/200 mL premix  Status:  Discontinued     1,000 mg 200 mL/hr over 60 Minutes Intravenous Every 12 hours 04/22/17 1655 04/25/17 1728   04/22/17 1630  vancomycin (VANCOCIN) IVPB 1000 mg/200 mL premix     1,000 mg 200 mL/hr over 60 Minutes Intravenous  Once 04/22/17 1627 04/22/17 1826   04/08/17 0200  piperacillin-tazobactam (ZOSYN) IVPB 3.375 g  Status:  Discontinued     3.375 g 12.5 mL/hr over 240 Minutes Intravenous Every 8 hours 04/08/17 0058 04/15/17 1601   04/08/17 0100  piperacillin-tazobactam (ZOSYN) IVPB 3.375 g  Status:  Discontinued     3.375 g 100 mL/hr over 30 Minutes Intravenous Every 6 hours 04/08/17 0054 04/08/17 0057   04/03/17 1200  ampicillin (OMNIPEN) 2 g in sodium chloride 0.9 % 50 mL IVPB     2 g 150 mL/hr over 20 Minutes Intravenous Every 6 hours 04/03/17 1005 04/06/17 1837   04/01/17 1800  vancomycin (VANCOCIN) IVPB 1000 mg/200 mL premix  Status:  Discontinued     1,000 mg 200 mL/hr over 60 Minutes Intravenous Every 24 hours 04/01/17 0146 04/01/17 0154   04/01/17 1600  ceFAZolin (ANCEF) IVPB 2g/100 mL premix     2 g 200 mL/hr over 30 Minutes Intravenous Every 8 hours 04/01/17 1100 04/02/17 0056   04/01/17 0828  bacitracin 50,000 Units in sodium chloride irrigation 0.9 % 500 mL irrigation  Status:  Discontinued       As needed 04/01/17 0837 04/01/17 0947  04/01/17 0600  piperacillin-tazobactam (ZOSYN) IVPB 3.375 g  Status:  Discontinued     3.375 g 12.5 mL/hr over 240 Minutes Intravenous Every 8 hours 04/01/17 0146 04/01/17 0154   04/01/17 0600  vancomycin (VANCOCIN) IVPB 750 mg/150 ml premix  Status:  Discontinued     750 mg 150 mL/hr over 60 Minutes Intravenous Every 12 hours 04/01/17 0225 04/03/17 0959   04/01/17 0400  piperacillin-tazobactam (ZOSYN) IVPB 3.375 g  Status:  Discontinued     3.375 g 12.5 mL/hr over 240 Minutes Intravenous Every 8 hours 04/01/17 0225 04/03/17 8466      Subjective:   Joe Wilkinson seen and examined today.  Continues to complain of headache. Denies other complaints at this time. Denies chest pain, shortness of breath, abdominal pain, N/V/D/C.   Objective:   Vitals:   05/24/17 2105 05/25/17 0032 05/25/17 0509 05/25/17 0924  BP: 132/79 133/72 (!) 118/59 120/77  Pulse: (!) 106 99 (!) 101 (!) 101  Resp: 18 18 18 18   Temp: (!) 97.5 F (36.4 C) 98 F (36.7 C) 98.1 F (36.7 C) 98.6 F (37 C)  TempSrc: Axillary Axillary Axillary Oral  SpO2: 98% 99% 99% 97%  Weight:      Height:        Intake/Output Summary (Last 24 hours) at 05/25/17 1243 Last data filed at 05/25/17 0800  Gross per 24 hour  Intake              410 ml  Output              800 ml  Net             -390 ml   Filed Weights   04/26/17 0416 04/26/17 2055 05/12/17 1112  Weight: 85.5 kg (188 lb 7.9 oz) 88.7 kg (195 lb 8.8 oz) 83.7 kg (184 lb 9.6 oz)   Exam  General: Well developed, well nourished, NAD, appears stated age  HEENT: NCAT, mucous membranes moist.   Cardiovascular: S1 S2 auscultated, RRR, no murmur  Respiratory: Clear to auscultation bilaterally with equal chest rise  Abdomen: Soft, nontender, nondistended, + bowel sounds  Extremities: warm dry without cyanosis clubbing or edema  Neuro: AAOx3, no new deficits, left sided weakness  Psych: Appropriate and pleasant  Data Reviewed: I have personally reviewed following labs and imaging studies  CBC:  Recent Labs Lab 05/20/17 0340 05/21/17 0610 05/22/17 0459  WBC 15.2* 11.6* 11.6*  HGB 13.9 13.5 12.7*  HCT 46.2 45.8 41.3  MCV 92.6 92.9 92.6  PLT 239 215 599   Basic Metabolic Panel:  Recent Labs Lab 05/20/17 0340 05/21/17 0610 05/22/17 0459  NA 137 136 135  K 4.6 4.3 4.3  CL 102 99* 100*  CO2 26 30 26   GLUCOSE 127* 124* 138*  BUN 29* 31* 29*  CREATININE 0.65 0.77 0.71  CALCIUM 11.1* 10.8* 10.3   GFR: Estimated Creatinine Clearance: 106.5 mL/min (by C-G formula based on SCr of 0.71 mg/dL). Liver Function  Tests: No results for input(s): AST, ALT, ALKPHOS, BILITOT, PROT, ALBUMIN in the last 168 hours. No results for input(s): LIPASE, AMYLASE in the last 168 hours. No results for input(s): AMMONIA in the last 168 hours. Coagulation Profile: No results for input(s): INR, PROTIME in the last 168 hours. Cardiac Enzymes: No results for input(s): CKTOTAL, CKMB, CKMBINDEX, TROPONINI in the last 168 hours. BNP (last 3 results) No results for input(s): PROBNP in the last 8760 hours. HbA1C: No results for input(s): HGBA1C in the  last 72 hours. CBG:  Recent Labs Lab 05/24/17 1157 05/24/17 1623 05/24/17 2107 05/25/17 0602 05/25/17 1124  GLUCAP 122* 103* 118* 109* 171*   Lipid Profile: No results for input(s): CHOL, HDL, LDLCALC, TRIG, CHOLHDL, LDLDIRECT in the last 72 hours. Thyroid Function Tests: No results for input(s): TSH, T4TOTAL, FREET4, T3FREE, THYROIDAB in the last 72 hours. Anemia Panel: No results for input(s): VITAMINB12, FOLATE, FERRITIN, TIBC, IRON, RETICCTPCT in the last 72 hours. Urine analysis:    Component Value Date/Time   COLORURINE YELLOW 05/02/2017 1353   APPEARANCEUR CLOUDY (A) 05/02/2017 1353   LABSPEC 1.017 05/02/2017 1353   PHURINE 7.0 05/02/2017 1353   GLUCOSEU NEGATIVE 05/02/2017 1353   HGBUR NEGATIVE 05/02/2017 1353   BILIRUBINUR NEGATIVE 05/02/2017 1353   KETONESUR NEGATIVE 05/02/2017 1353   PROTEINUR 100 (A) 05/02/2017 1353   NITRITE NEGATIVE 05/02/2017 1353   LEUKOCYTESUR NEGATIVE 05/02/2017 1353   Sepsis Labs: @LABRCNTIP (procalcitonin:4,lacticidven:4)  ) No results found for this or any previous visit (from the past 240 hour(s)).    Radiology Studies: No results found.   Scheduled Meds: . acetaminophen (TYLENOL) oral liquid 160 mg/5 mL  650 mg Per Tube TID  . apixaban  5 mg Per Tube BID  . chlorhexidine  15 mL Mouth Rinse BID  . feeding supplement (ENSURE ENLIVE)  237 mL Oral BID BM  . feeding supplement (GLUCERNA 1.2 CAL)  1,000 mL Per  Tube Q24H  . feeding supplement (PRO-STAT SUGAR FREE 64)  30 mL Per Tube BID  . free water  250 mL Per Tube Q8H  . gabapentin  300 mg Oral QHS  . glycopyrrolate  1 mg Per Tube BID  . insulin aspart  0-20 Units Subcutaneous TID WC  . insulin aspart  0-5 Units Subcutaneous QHS  . insulin detemir  34 Units Subcutaneous Daily  . metoprolol tartrate  12.5 mg Per Tube BID  . pantoprazole sodium  40 mg Per Tube Daily   Continuous Infusions:    LOS: 54 days   Time Spent in minutes   20 minutes  Loyal Holzheimer D.O. on 05/25/2017 at 12:43 PM  Between 7am to 7pm - Pager - [phone]  After 7pm go to [email or web address] - password [email or web address]  And look for the night coverage person covering for me after hours  [facility] Office  [phone]

## 2017-05-25 NOTE — Progress Notes (Signed)
PULMONARY / CRITICAL CARE MEDICINE   Name: Joe Wilkinson MRN: [id number] DOB: 11/13/1960    ADMISSION DATE:  03/31/2017 CHIEF COMPLAINT:  CVA  HISTORY OF PRESENT ILLNESS:   56 yo male brought to [facility] with altered mental status from DKA and sepsis.  Found to have RT MCA CVA and transfer to [facility].  Required Rt decompressive craniectomy and remained on vent post op.  SUBJECTIVE:  Decannulated yesterday, no new complaints  VITAL SIGNS: BP 120/77 (BP Location: Right Arm)   Pulse (!) 101   Temp 98.6 F (37 C) (Oral)   Resp 18   Ht 5\' 10"  (1.778 m)   Wt 83.7 kg (184 lb 9.6 oz)   SpO2 97%   BMI 26.49 kg/m   INTAKE / OUTPUT: I/O last 3 completed shifts: In: 1203.2 [P.O.:600; NG/GT:603.2] Out: 2300 [Urine:2300]  PHYSICAL EXAMINATION:   General:  NAD on RA HEENT: Cranial deformity noted, stoma clean PSY: Depressed affect this AM Neuro: Left sided weakness noted on exam CV: RRR, Nl S1/S2, -M/R/G PULM: CTA bilaterally GI: Soft, NT, ND and +BS Extremities: -edema and -tenderness Skin: Intact  BMET  Recent Labs Lab 05/20/17 0340 05/21/17 0610 05/22/17 0459  NA 137 136 135  K 4.6 4.3 4.3  CL 102 99* 100*  CO2 26 30 26   BUN 29* 31* 29*  CREATININE 0.65 0.77 0.71  GLUCOSE 127* 124* 138*   Electrolytes  Recent Labs Lab 05/20/17 0340 05/21/17 0610 05/22/17 0459  CALCIUM 11.1* 10.8* 10.3   CBC  Recent Labs Lab 05/20/17 0340 05/21/17 0610 05/22/17 0459  WBC 15.2* 11.6* 11.6*  HGB 13.9 13.5 12.7*  HCT 46.2 45.8 41.3  PLT 239 215 228   Glucose  Recent Labs Lab 05/23/17 2045 05/24/17 0606 05/24/17 1157 05/24/17 1623 05/24/17 2107 05/25/17 0602  GLUCAP 105* 148* 122* 103* 118* 109*   Imaging No results found.  STUDIES:  LP 8/24 >> glucose 205, RBC 16, protein 62, WBC 5 CT head 8/24 >> Large Rt MCA EEG 8/27 >> bilateral cerebral dysfunction, non-specific in etiology Echo 8/27 >> EF 65 to 70% CT abd/pelvis 8/30 >> third spacing, 3.5 cm low  attenuation Lt hepatic lobe CT Head 9/3>> Severe right hemisphere cytotoxic edema with stable extension of edematous brain through the right craniectomy defect. Stable petechial hemorrhage without malignant hemorrhagicTransformation. Stable intracranial mass effect with leftward midline shift of 7 mm. Small left PCA territory infarct. CXR 9/23 > LLL atx and small effusion Barrium swallow 9/21 > severe oral and mod pharyngeal dysphagia; mod to severe aspiration risk SLP 10/4 >> tolerating PMV, pocketing food on left  CULTURES: CSF 8/24>> negative BCX 8/24>> negative UCX 8/24>> negative Sputum 8/24>> S. Agalactiae Sputum 9/1>> Consistent with normal resp. Flora UC 9/25 >> klebsiella (S-rocephin, imipenem), Ecoli (S- rocephin, imipenem)  ANTIBIOTICS: vanco 8/24 >> 8/26 Zosyn 8/24 >> 8/26, 9/1 >>9/8  Cleocin 9/18>>>9/22 Ampicillin 8/27 > 8/30  SIGNIFICANT EVENTS: 8/23 Admit [facility] 8/23 8/24 Transfer to [facility] after Rt MCA CVA, start 3% NS 8/25 Decompressive craniectomy 8/27 Transfuse 1 unit PRBC 8/28 Transfuse 1 unit PRBC, A fib with RVR 8/30 Transfuse 1 unit PRBC 8/31 Off pressors, reintubated x 2, transfuse PRBC 9/5 Trach 9/6 PEG 9/14 Trach exchanged from 8 cuffed to 6 cuffless 9/22 Trach exchanged to 4 cuffless 10/01 Tolerating PMV, ATC 28% 10/04 ATC 28%, minimal secretions, tolerating PMV 10/17 ready for decannulation   LINES/TUBES: ETT 8/23 >>9/5 Trach [name]) 9/5 >>10/17 PEG 9/6 >>  I reviewed CXR myself, not  acute disease noted.  ASSESSMENT / PLAN:  Acute on chronic hypoxemic respiratory failure s/p trach  Plan:  - Titrate O2 for sat of 88-92%  Small pleural effusion Plan:  - F/U CXR   - No thora for now  Trach status Plan:  - Monitor for airway protection  PCCM will sign off, please call back if needed.  Rush Farmer, M.D. [facility] Pulmonary/Critical Care Medicine. Pager: [phone]. After hours pager: [phone].

## 2017-05-26 ENCOUNTER — Inpatient Hospital Stay (HOSPITAL_COMMUNITY): Payer: Medicaid Other

## 2017-05-26 DIAGNOSIS — R05 Cough: Secondary | ICD-10-CM

## 2017-05-26 DIAGNOSIS — Z9289 Personal history of other medical treatment: Secondary | ICD-10-CM

## 2017-05-26 LAB — CBC
HEMATOCRIT: 43.3 % (ref 39.0–52.0)
HEMOGLOBIN: 13 g/dL (ref 13.0–17.0)
MCH: 27.8 pg (ref 26.0–34.0)
MCHC: 30 g/dL (ref 30.0–36.0)
MCV: 92.5 fL (ref 78.0–100.0)
Platelets: 213 10*3/uL (ref 150–400)
RBC: 4.68 MIL/uL (ref 4.22–5.81)
RDW: 15.8 % — ABNORMAL HIGH (ref 11.5–15.5)
WBC: 13.1 10*3/uL — AB (ref 4.0–10.5)

## 2017-05-26 LAB — BASIC METABOLIC PANEL
ANION GAP: 9 (ref 5–15)
BUN: 28 mg/dL — ABNORMAL HIGH (ref 6–20)
CALCIUM: 10.5 mg/dL — AB (ref 8.9–10.3)
CHLORIDE: 98 mmol/L — AB (ref 101–111)
CO2: 30 mmol/L (ref 22–32)
Creatinine, Ser: 0.8 mg/dL (ref 0.61–1.24)
GFR calc non Af Amer: >60 mL/min (ref >60)
Glucose, Bld: 122 mg/dL — ABNORMAL HIGH (ref 65–99)
Potassium: 4.3 mmol/L (ref 3.5–5.1)
Sodium: 137 mmol/L (ref 135–145)

## 2017-05-26 LAB — GLUCOSE, CAPILLARY
GLUCOSE-CAPILLARY: 134 mg/dL — AB (ref 65–99)
GLUCOSE-CAPILLARY: 90 mg/dL (ref 65–99)
Glucose-Capillary: 127 mg/dL — ABNORMAL HIGH (ref 65–99)
Glucose-Capillary: 134 mg/dL — ABNORMAL HIGH (ref 65–99)

## 2017-05-26 MED ORDER — GUAIFENESIN-CODEINE 100-10 MG/5ML PO SOLN
5.0000 mL | ORAL | Status: DC | PRN
  Administered 2017-05-26 – 2017-05-27 (×2): 5 mL via ORAL
  Filled 2017-05-26 (×3): qty 5

## 2017-05-26 NOTE — Progress Notes (Signed)
CSW updated from CSW Surveyor, quantity that patient has a bed available at [facility] on Monday. CSW will follow to faciliate discharge when bed available.  Laveda Abbe, [place] Clinical Social Worker [phone]

## 2017-05-26 NOTE — Care Management Note (Signed)
Case Management Note  Patient Details  Name: Joe Wilkinson MRN: [id number] Date of Birth: 01-03-1961  Subjective/Objective:                    Action/Plan: Plan is for patient to d/c to [facility] on Monday. No further needs per CM.   Expected Discharge Date:  04/28/17               Expected Discharge Plan:  [place]  In-House Referral:  Clinical Social Work, Scientist, research (medical)  CM Consult  Post Acute Care Choice:    Choice offered to:     DME Arranged:    DME Agency:     HH Arranged:    [place] Agency:     Status of Service:  In process, will continue to follow  If discussed at Long Length of Stay Meetings, dates discussed:    Additional Comments:  Pollie Friar, RN 05/26/2017, 2:48 PM

## 2017-05-26 NOTE — Progress Notes (Signed)
Physical Therapy Treatment Patient Details Name: Joe Wilkinson MRN: [id number] DOB: 10-Nov-1960 Today's Date: 05/26/2017    History of Present Illness 56 yo male brought to [facility] with altered mental status from DKA and sepsis.  Found to have RT MCA CVA and transfer to [facility].  Required Rt decompressive craniectomy and remained on vent post op.ETT 8/23-9/5, s/p tracheostomy 9/5    PT Comments     Patient is still demonstrating left side neglect and requiring increased physical assistance +2 for bed mobility. Unable to tolerate transfers this session. Today's treatment focus on sitting balance and postural control exercises at EOB; reaching with R UE outside of his BOS. Patient was able to self correct for his LOB x2. Still needing mod assist with some challenges. PT will continue to follow acutely and progress as tolerated.   Follow Up Recommendations  SNF;Supervision/Assistance - 24 hour     Equipment Recommendations  None recommended by PT    Recommendations for Other Services       Precautions / Restrictions Precautions Precautions: Fall Precaution Comments: craniectomy, no bone flap right side; trach in place; peg tube; L side neglect Restrictions Weight Bearing Restrictions: No    Mobility  Bed Mobility Overal bed mobility: Needs Assistance Bed Mobility: Rolling;Supine to Sit;Sit to Supine Rolling: Max assist;+2 for physical assistance   Supine to sit: Max assist;+2 for physical assistance Sit to supine: Max assist;+2 for physical assistance   General bed mobility comments: helicopter technique to bring patient to EOB. Pt able to minimally assist with R LE movements toward edge. Resistant to elevate trunk to upright requiring max assist  Transfers                    Ambulation/Gait                 Stairs            Wheelchair Mobility    Modified Rankin (Stroke Patients Only) Modified Rankin (Stroke Patients Only) Pre-Morbid Rankin Score: No  symptoms Modified Rankin: Severe disability     Balance Overall balance assessment: Needs assistance Sitting-balance support: Feet supported;Single extremity supported Sitting balance-Leahy Scale: Poor Sitting balance - Comments: pt able to maintain static sitting with 1 UE support occasional instance of no UE support and maintaining static sitting.                                    Cognition Arousal/Alertness: Awake/alert Behavior During Therapy: Flat affect Overall Cognitive Status: Impaired/Different from baseline Area of Impairment: Attention;Memory;Following commands;Safety/judgement;Awareness;Problem solving                   Current Attention Level: Focused Memory: Decreased recall of precautions;Decreased short-term memory Following Commands: Follows one step commands inconsistently;Follows one step commands with increased time Safety/Judgement: Decreased awareness of safety;Decreased awareness of deficits Awareness: Intellectual Problem Solving: Slow processing;Decreased initiation;Difficulty sequencing;Requires verbal cues;Requires tactile cues General Comments: still demonstrating L side neglect; insistant on walking but not willing to work at bed level or with static standing      Exercises Other Exercises Other Exercises: Sitting balance and trunk control activities at EOB. Reaching with RUE outside of BOS    General Comments        Pertinent Vitals/Pain Pain Assessment: Faces Faces Pain Scale: Hurts a little bit Pain Location: Left leg and hand; head Pain Descriptors / Indicators: Aching;Tender;Guarding;Grimacing Pain Intervention(s): Monitored during session  Home Living                      Prior Function            PT Goals (current goals can now be found in the care plan section) Acute Rehab PT Goals Patient Stated Goal: to walk PT Goal Formulation: Patient unable to participate in goal setting Time For Goal  Achievement: 06/01/17 Potential to Achieve Goals: Fair Progress towards PT goals: Progressing toward goals    Frequency    Min 3X/week      PT Plan Current plan remains appropriate    Co-evaluation              AM-PAC PT "6 Clicks" Daily Activity  Outcome Measure  Difficulty turning over in bed (including adjusting bedclothes, sheets and blankets)?: Unable Difficulty moving from lying on back to sitting on the side of the bed? : Unable Difficulty sitting down on and standing up from a chair with arms (e.g., wheelchair, bedside commode, etc,.)?: Unable Help needed moving to and from a bed to chair (including a wheelchair)?: Total Help needed walking in hospital room?: Total Help needed climbing 3-5 steps with a railing? : Total 6 Click Score: 6    End of Session Equipment Utilized During Treatment: Gait belt Activity Tolerance: Patient limited by lethargy (Stated several times that he just wanted to get back in bed) Patient left: with call bell/phone within reach;in bed Nurse Communication: Mobility status PT Visit Diagnosis: Hemiplegia and hemiparesis;Other symptoms and signs involving the nervous system (R29.898) Hemiplegia - Right/Left: Left Hemiplegia - dominant/non-dominant: Non-dominant Hemiplegia - caused by: Nontraumatic intracerebral hemorrhage     Time: 9509-3267 PT Time Calculation (min) (ACUTE ONLY): 24 min  Charges:  $Therapeutic Activity: 8-22 mins                    G CodesSindy Guadel[place], SPT [phone] office    Margarita Grizzle 05/26/2017, 3:39 PM

## 2017-05-26 NOTE — Progress Notes (Signed)
PROGRESS NOTE    Joe Wilkinson  NAT:[id number] DOB: 1960-09-07 DOA: 03/31/2017 PCP: Patient, No Pcp Per   No chief complaint on file.   Brief Narrative:  HPI on 04/01/2017 by Dr. Hanley Hays (PCCM) [phone] found unresponsive at bustop admitted to [facility] in DKA, concern for sepsis w/o source, later developed hemiparesis and found to have acute Right MCA CVA.  Now transferred to [facility] ICU for NeuroSx care, seen by NSX and Neuro.  Tentative plan for crani in AM. He is intubated and sedated and unable to provide any HPI, ROS, PMH, PSH, FH, home meds, allergies.  All Hx form medical record.  Interim history  He was transferred to the ICU and underwent decompressive right craniectomy 8/25. Hypertonic saline protocol was completed. He required transfusion support and was treated for AFib with RVR, continued on the ventilator, eventually receiving trach 9/5 and PEG 9/6, on ATC 9/7. Patient does not have health insurance and because of his craniectomy without the ability to fit a helmet, most skilled nursing facilities areunwilling to accept him.  Currently he is getting meds thru PEG and continuous TF. Heparin was converted to prophylaxis Lovenox. He was transferred from [facility] service to [facility] service on 9/25. Currently awaiting SNF placement. PCCM continues to follow patient, getting closer to decannulation of the trach.   Assessment & Plan   Acute respiratory failure with hypoxia and hypercarbia  -Continue pulmonary hygiene  -s/p trach -PCCM continues to follow patient, s/p decannulation on 05/24/2017  Septic shock secondary to S. 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 Leonie Man, recommended anticoagulation (FOBT negative), started on Eliquis  -LDL 38, hemoglobin A1C 14.3 -Repeat CT had showed further swelling in the region of the complete right MCA territory infarction with more extensive bulging of the brain through the craniectomy defect. No midline shift however because of decompressive craniotomy. At superior margin, there could be some extension of the swollen brain through a dural defect. At the superior margin, small amount of petechial bleeding within the region of the infarction. -PT/OT rec SNF  Atrial fibrillation with RVR, new onset -Currently in sinus rhythm -CHADSVASC 4 -Patient was not placed on anticoagulation as he was found to be anemic and required 4 units PRBC transfusion this admission -Continue Eliquis and metoprolol  Diabetes mellitus, type II -Hemoglobin A1c 14.3 -Continue Levemir, insulin sliding scale CBG monitoring  Anemia blood loss -Unclear source -Abdominal x-ray does not show bleeding, FOBT negative -Hemoglobin 11.9, appears to be stable of the past several weeks -Continue to monitor CBC periodically   Escherichia coli, Klebsiella UTI -Completed 10 days of keflex on 05/15/2017  Nutrition/dysphagia -Speech therapy recommended dysphagia 1 diet, patient currently on tube feeds -Patient did have some abdominal distention on 05/06/2017, abdominal film does not show ileus or obstruction -Continue to monitor  Peri-anal cellulitis with small abscess -Gen. surgery consulted and appreciated, determined neurosurgical drainage necessary -Patient was on clindamycin through 04/29/2017  DVT Prophylaxis  Eliquis   Code Status:  Full  Family Communication: None at bedside  Disposition Plan: Admitted, pending SNF   Consultants PCCM Neurology Neurosurgery General surgery   Procedures  -LP 8/24 >> glucose 205, RBC 16, protein 62, WBC 5 -CT head 8/24 >> Large Rt MCA -8/25 decompressive craniectomy -EEG 8/27 >> bilateral cerebral dysfunction, non-specific in etiology -Echo 8/27 >> EF 65 to 70% -CT abd/pelvis 8/30 >> third spacing, 3.5 cm low attenuation Lt hepatic lobe -CT Head 9/3>> Severe right hemisphere cytotoxic edema with stable extension of edematous brain through the right craniectomy defect. Stable petechial hemorrhage without malignant hemorrhagicTransformation. Stable intracranial mass effect with leftward midline shift of 7 mm. Small left PCA territory infarct. -9/5 Trach placement.  -9/6 PEG tube placement.  -CXR 9/23 > LLL atx and small effusion -Barrium swallow 9/21 > severe oral and mod pharyngeal dysphagia; mod to severe aspiration risk -CT head on 10/1 shows Further swelling in the region of the complete right MCA territory infarction with more extensive bulging of the brain through the craniectomy defect. No midline shift.  -10/17 Decannulation  Antibiotics   Anti-infectives    Start     Dose/Rate Route Frequency Ordered Stop   05/05/17 2200  cephALEXin (KEFLEX) capsule 500 mg     500 mg Oral Every 12 hours 05/05/17 1640 05/15/17 1042   04/26/17 0600  vancomycin (VANCOCIN) IVPB 750 mg/150 ml premix  Status:  Discontinued     750 mg 150 mL/hr over 60 Minutes Intravenous Every 12 hours 04/25/17 1729 04/25/17 1730   04/25/17 1830  clindamycin (CLEOCIN) capsule 300 mg     300 mg Oral Every 6 hours 04/25/17 1730 04/29/17 1717   04/25/17 1800  vancomycin (VANCOCIN) IVPB 750 mg/150 ml premix  Status:  Discontinued     750 mg 150 mL/hr over 60 Minutes Intravenous Every 12 hours 04/25/17 1728 04/25/17 1729   04/23/17 0945  piperacillin-tazobactam (ZOSYN) IVPB 3.375 g  Status:  Discontinued      3.375 g 12.5 mL/hr over 240 Minutes Intravenous Every 8 hours 04/23/17 0930 04/25/17 1730   04/23/17 0430  vancomycin (VANCOCIN) IVPB 1000 mg/200 mL premix  Status:  Discontinued     1,000 mg 200 mL/hr over 60 Minutes Intravenous Every 12 hours 04/22/17 1655 04/25/17 1728   04/22/17 1630  vancomycin (VANCOCIN) IVPB 1000 mg/200 mL premix     1,000 mg 200 mL/hr over 60 Minutes Intravenous  Once 04/22/17 1627 04/22/17 1826   04/08/17 0200  piperacillin-tazobactam (ZOSYN) IVPB 3.375 g  Status:  Discontinued     3.375 g 12.5 mL/hr over 240 Minutes Intravenous Every 8 hours 04/08/17 0058 04/15/17 1601   04/08/17 0100  piperacillin-tazobactam (ZOSYN) IVPB 3.375 g  Status:  Discontinued     3.375 g 100 mL/hr over 30 Minutes Intravenous Every 6 hours 04/08/17 0054 04/08/17 0057   04/03/17 1200  ampicillin (OMNIPEN) 2 g in sodium chloride 0.9 % 50 mL IVPB     2 g 150 mL/hr over 20 Minutes Intravenous Every 6 hours 04/03/17 1005 04/06/17 1837   04/01/17 1800  vancomycin (VANCOCIN) IVPB 1000 mg/200 mL premix  Status:  Discontinued     1,000 mg 200 mL/hr over 60 Minutes Intravenous Every 24 hours 04/01/17 0146 04/01/17 0154   04/01/17 1600  ceFAZolin (ANCEF) IVPB 2g/100 mL premix     2 g 200 mL/hr over 30 Minutes Intravenous Every 8 hours 04/01/17 1100 04/02/17 0056   04/01/17 0828  bacitracin 50,000 Units in sodium chloride irrigation 0.9 % 500 mL irrigation  Status:  Discontinued       As needed 04/01/17 0837 04/01/17 0947   04/01/17 0600  piperacillin-tazobactam (ZOSYN) IVPB 3.375 g  Status:  Discontinued     3.375 g 12.5 mL/hr over 240 Minutes Intravenous Every 8 hours 04/01/17 0146 04/01/17 0154   04/01/17 0600  vancomycin (VANCOCIN) IVPB 750 mg/150 ml premix  Status:  Discontinued     750 mg 150 mL/hr over 60 Minutes Intravenous Every 12 hours 04/01/17 0225 04/03/17 0959   04/01/17 0400  piperacillin-tazobactam (ZOSYN) IVPB 3.375 g  Status:  Discontinued     3.375 g 12.5 mL/hr over 240  Minutes Intravenous Every 8 hours 04/01/17 0225 04/03/17 7026      Subjective:   Joe Wilkinson seen and examined today.   Continues to have headache and cough. Does not feel cough medicine has helped much. Feels he has chest congestion. Denies chest pain, shortness of breath, abdominal pain, N/V/D/C.   Objective:   Vitals:   05/25/17 2059 05/26/17 0002 05/26/17 0516 05/26/17 1042  BP: 123/61 118/67 115/61 98/69  Pulse: 91 99 (!) 102 96  Resp: 20 20 18 16   Temp: 98.1 F (36.7 C) 98.3 F (36.8 C) 98.1 F (36.7 C) 98.7 F (37.1 C)  TempSrc: Oral Oral Oral Oral  SpO2: 98% 100% 100% 96%  Weight:      Height:        Intake/Output Summary (Last 24 hours) at 05/26/17 1128 Last data filed at 05/26/17 0517  Gross per 24 hour  Intake              480 ml  Output             2000 ml  Net            -1520 ml   Filed Weights   04/26/17 0416 04/26/17 2055 05/12/17 1112  Weight: 85.5 kg (188 lb 7.9 oz) 88.7 kg (195 lb 8.8 oz) 83.7 kg (184 lb 9.6 oz)   Exam  General: Well developed, well nourished, NAD, appears stated age  HEENT: NCAT, mucous membranes moist. Right craniectomy   Cardiovascular: S1 S2 auscultated, RRR, no murmur  Respiratory: Clear to auscultation bilaterally with equal chest rise  Abdomen: Soft, nontender, nondistended, + bowel sounds  Extremities: warm dry without cyanosis clubbing or edema  Neuro: AAOx3, left sided weakness, no new deficits  Psych: Normal affect and demeanor with intact judgement and insight  Data Reviewed: I have personally reviewed following labs and imaging studies  CBC:  Recent Labs Lab 05/20/17 0340 05/21/17 0610 05/22/17 0459 05/26/17 0526  WBC 15.2* 11.6* 11.6* 13.1*  HGB 13.9 13.5 12.7* 13.0  HCT 46.2 45.8 41.3 43.3  MCV 92.6 92.9 92.6 92.5  PLT 239 215 228 378   Basic Metabolic Panel:  Recent Labs Lab 05/20/17 0340 05/21/17 0610 05/22/17 0459 05/26/17 0526  NA 137 136 135 137  K 4.6 4.3 4.3 4.3  CL 102 99*  100* 98*  CO2 26 30 26 30   GLUCOSE 127* 124* 138* 122*  BUN 29* 31* 29* 28*  CREATININE 0.65 0.77 0.71 0.80  CALCIUM 11.1* 10.8* 10.3 10.5*   GFR: Estimated Creatinine Clearance: 106.5 mL/min (by C-G formula based on SCr of 0.8 mg/dL). Liver Function Tests: No results for input(s): AST, ALT, ALKPHOS, BILITOT, PROT, ALBUMIN in the last 168 hours. No results for input(s): LIPASE, AMYLASE in the last 168 hours. No results for input(s): AMMONIA in the last 168 hours. Coagulation Profile: No results for  input(s): INR, PROTIME in the last 168 hours. Cardiac Enzymes: No results for input(s): CKTOTAL, CKMB, CKMBINDEX, TROPONINI in the last 168 hours. BNP (last 3 results) No results for input(s): PROBNP in the last 8760 hours. HbA1C: No results for input(s): HGBA1C in the last 72 hours. CBG:  Recent Labs Lab 05/25/17 1124 05/25/17 1612 05/25/17 2058 05/26/17 0612 05/26/17 1122  GLUCAP 171* 68 129* 127* 134*   Lipid Profile: No results for input(s): CHOL, HDL, LDLCALC, TRIG, CHOLHDL, LDLDIRECT in the last 72 hours. Thyroid Function Tests: No results for input(s): TSH, T4TOTAL, FREET4, T3FREE, THYROIDAB in the last 72 hours. Anemia Panel: No results for input(s): VITAMINB12, FOLATE, FERRITIN, TIBC, IRON, RETICCTPCT in the last 72 hours. Urine analysis:    Component Value Date/Time   COLORURINE YELLOW 05/02/2017 1353   APPEARANCEUR CLOUDY (A) 05/02/2017 1353   LABSPEC 1.017 05/02/2017 1353   PHURINE 7.0 05/02/2017 1353   GLUCOSEU NEGATIVE 05/02/2017 1353   HGBUR NEGATIVE 05/02/2017 1353   BILIRUBINUR NEGATIVE 05/02/2017 1353   KETONESUR NEGATIVE 05/02/2017 1353   PROTEINUR 100 (A) 05/02/2017 1353   NITRITE NEGATIVE 05/02/2017 1353   LEUKOCYTESUR NEGATIVE 05/02/2017 1353   Sepsis Labs: @LABRCNTIP (procalcitonin:4,lacticidven:4)  ) No results found for this or any previous visit (from the past 240 hour(s)).    Radiology Studies: Dg Chest Port 1 View  Result Date:  05/26/2017 CLINICAL DATA:  Cough EXAM: PORTABLE CHEST 1 VIEW COMPARISON:  05/13/2017 FINDINGS: Tracheostomy tube has been removed in the interval. Cardiac shadow is within normal limits. Lungs are well aerated bilaterally. No focal infiltrate or sizable effusion is seen. Mild degenerative changes of the thoracic spine are noted. IMPRESSION: No active disease. Electronically Signed   By: Inez Catalina M.D.   On: 05/26/2017 09:11     Scheduled Meds: . acetaminophen (TYLENOL) oral liquid 160 mg/5 mL  650 mg Per Tube TID  . apixaban  5 mg Per Tube BID  . benzonatate  200 mg Oral TID  . chlorhexidine  15 mL Mouth Rinse BID  . feeding supplement (ENSURE ENLIVE)  237 mL Oral BID BM  . feeding supplement (GLUCERNA 1.2 CAL)  1,000 mL Per Tube Q24H  . feeding supplement (PRO-STAT SUGAR FREE 64)  30 mL Per Tube BID  . free water  250 mL Per Tube Q8H  . gabapentin  300 mg Oral QHS  . glycopyrrolate  1 mg Per Tube BID  . insulin aspart  0-20 Units Subcutaneous TID WC  . insulin aspart  0-5 Units Subcutaneous QHS  . insulin detemir  34 Units Subcutaneous Daily  . metoprolol tartrate  12.5 mg Per Tube BID  . pantoprazole sodium  40 mg Per Tube Daily   Continuous Infusions:    LOS: 55 days   Time Spent in minutes   20 minutes  Joe Wilkinson D.O. on 05/26/2017 at 11:28 AM  Between 7am to 7pm - Pager - [phone]  After 7pm go to [email or web address] - password [email or web address]  And look for the night coverage person covering for me after hours  [facility] Office  [phone]

## 2017-05-27 LAB — GLUCOSE, CAPILLARY
GLUCOSE-CAPILLARY: 101 mg/dL — AB (ref 65–99)
GLUCOSE-CAPILLARY: 110 mg/dL — AB (ref 65–99)
Glucose-Capillary: 113 mg/dL — ABNORMAL HIGH (ref 65–99)
Glucose-Capillary: 83 mg/dL (ref 65–99)

## 2017-05-27 NOTE — Progress Notes (Signed)
PROGRESS NOTE    Joe Wilkinson  SAY:[id number] DOB: 01/08/1961 DOA: 03/31/2017 PCP: Patient, No Pcp Per   No chief complaint on file.   Brief Narrative:   56yo M found unresponsive at bus[place]stop admitted to [facility] in DKA, concern for sepsis w/o source, later developed hemiparesis and found to have acute Right MCA CVA.  Transferred to [facility] ICU for NeuroSx care, seen by NSX and Neuro.  He was transferred to the ICU and underwent decompressive right craniectomy 8/25. Hypertonic saline protocol was completed. He required transfusion support and was treated for AFib with RVR, continued on the ventilator, eventually receiving trach 9/5 and PEG 9/6, on ATC 9/7. Patient does not have health insurance and because of his craniectomy without the ability to fit a helmet, most skilled nursing facilities areunwilling to accept him.  Currently he is getting meds thru PEG and continuous TF. Heparin was converted to prophylaxis Lovenox. He was transferred from [facility] service to [facility] service on 9/25. Currently awaiting SNF placement. PCCM continues to follow patient, getting closer to decannulation of the trach.   Assessment & Plan   Acute respiratory failure with hypoxia and hypercarbia  -Continue pulmonary hygiene  -s/p trach -PCCM continues to follow patient, s/p decannulation on 05/24/2017  Septic shock secondary to S. 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  Leonie Man, recommended anticoagulation (FOBT negative), started on Eliquis  -LDL 38, hemoglobin A1C 14.3 -Repeat CT had showed further swelling in the region of the complete right MCA territory infarction with more extensive bulging of the brain through the craniectomy defect. No midline shift however because of decompressive craniotomy. At superior margin, there could be some extension of the swollen brain through a dural defect. At the superior margin, small amount of petechial bleeding within the region of the infarction. -PT/OT rec SNF- bed available on Monday  Atrial fibrillation with RVR, new onset -Currently in sinus rhythm -CHADSVASC 4 -Continue Eliquis and metoprolol  Diabetes mellitus, type II -Hemoglobin A1c 14.3 -Continue Levemir, insulin sliding scale CBG monitoring  Anemia blood loss -Unclear source -FOBT negative -Hemoglobin appears to be stable of the past several weeks -Continue to monitor CBC periodically   Escherichia coli, Klebsiella UTI -Completed 10 days of keflex on 05/15/2017  Nutrition/dysphagia -Speech therapy recommended dysphagia 1 diet, patient currently on tube feeds -Continue to monitor  Peri-anal cellulitis with small abscess -Gen. surgery consulted and appreciated, determined neurosurgical drainage necessary -Patient was on clindamycin through 04/29/2017  DVT Prophylaxis  Eliquis   Code Status: Full  Family Communication: None at bedside  Disposition Plan: SNF on Monday AM- [facility] Neuro[facility] General surgery   Procedures  -LP 8/24 >> glucose 205, RBC 16, protein 62, WBC 5 -CT head 8/24 >> Large Rt MCA -8/25 decompressive craniectomy -EEG 8/27 >> bilateral cerebral dysfunction, non-specific in etiology -Echo 8/27 >> EF 65 to 70% -CT abd/pelvis 8/30 >> third spacing, 3.5 cm low attenuation Lt hepatic lobe -CT Head 9/3>> Severe right hemisphere cytotoxic edema with stable extension of edematous brain  through the right  craniectomy defect. Stable petechial hemorrhage without malignant hemorrhagicTransformation. Stable intracranial mass effect with leftward midline shift of 7 mm. Small left PCA territory infarct. -9/5 Trach placement.  -9/6 PEG tube placement.  -CXR 9/23 > LLL atx and small effusion -Barrium swallow 9/21 > severe oral and mod pharyngeal dysphagia; mod to severe aspiration risk -CT head on 10/1 shows Further swelling in the region of the complete right MCA territory infarction with more extensive bulging of the brain through the craniectomy defect. No midline shift.  -10/17 Decannulation  Antibiotics   Anti-infectives    Start     Dose/Rate Route Frequency Ordered Stop   05/05/17 2200  cephALEXin (KEFLEX) capsule 500 mg     500 mg Oral Every 12 hours 05/05/17 1640 05/15/17 1042   04/26/17 0600  vancomycin (VANCOCIN) IVPB 750 mg/150 ml premix  Status:  Discontinued     750 mg 150 mL/hr over 60 Minutes Intravenous Every 12 hours 04/25/17 1729 04/25/17 1730   04/25/17 1830  clindamycin (CLEOCIN) capsule 300 mg     300 mg Oral Every 6 hours 04/25/17 1730 04/29/17 1717   04/25/17 1800  vancomycin (VANCOCIN) IVPB 750 mg/150 ml premix  Status:  Discontinued     750 mg 150 mL/hr over 60 Minutes Intravenous Every 12 hours 04/25/17 1728 04/25/17 1729   04/23/17 0945  piperacillin-tazobactam (ZOSYN) IVPB 3.375 g  Status:  Discontinued     3.375 g 12.5 mL/hr over 240 Minutes Intravenous Every 8 hours 04/23/17 0930 04/25/17 1730   04/23/17 0430  vancomycin (VANCOCIN) IVPB 1000 mg/200 mL premix  Status:  Discontinued     1,000 mg 200 mL/hr over 60 Minutes Intravenous Every 12 hours 04/22/17 1655 04/25/17 1728   04/22/17 1630  vancomycin (VANCOCIN) IVPB 1000 mg/200 mL premix     1,000 mg 200 mL/hr over 60 Minutes Intravenous  Once 04/22/17 1627 04/22/17 1826   04/08/17 0200  piperacillin-tazobactam (ZOSYN) IVPB 3.375 g  Status:  Discontinued     3.375 g 12.5 mL/hr over 240 Minutes  Intravenous Every 8 hours 04/08/17 0058 04/15/17 1601   04/08/17 0100  piperacillin-tazobactam (ZOSYN) IVPB 3.375 g  Status:  Discontinued     3.375 g 100 mL/hr over 30 Minutes Intravenous Every 6 hours 04/08/17 0054 04/08/17 0057   04/03/17 1200  ampicillin (OMNIPEN) 2 g in sodium chloride 0.9 % 50 mL IVPB     2 g 150 mL/hr over 20 Minutes Intravenous Every 6 hours 04/03/17 1005 04/06/17 1837   04/01/17 1800  vancomycin (VANCOCIN) IVPB 1000 mg/200 mL premix  Status:  Discontinued     1,000 mg 200 mL/hr over 60 Minutes Intravenous Every 24 hours 04/01/17 0146 04/01/17 0154   04/01/17 1600  ceFAZolin (ANCEF) IVPB 2g/100 mL premix     2 g 200 mL/hr over 30 Minutes Intravenous Every 8 hours 04/01/17 1100 04/02/17 0056   04/01/17 0828  bacitracin 50,000 Units in sodium chloride irrigation 0.9 % 500 mL irrigation  Status:  Discontinued       As needed 04/01/17 0837 04/01/17 0947   04/01/17 0600  piperacillin-tazobactam (ZOSYN) IVPB 3.375 g  Status:  Discontinued     3.375 g 12.5 mL/hr over 240 Minutes Intravenous Every 8 hours 04/01/17 0146 04/01/17 0154   04/01/17 0600  vancomycin (VANCOCIN) IVPB 750 mg/150 ml premix  Status:  Discontinued     750 mg 150 mL/hr over 60 Minutes Intravenous Every 12 hours 04/01/17 0225 04/03/17 0959   04/01/17 0400  piperacillin-tazobactam (ZOSYN)  IVPB 3.375 g  Status:  Discontinued     3.375 g 12.5 mL/hr over 240 Minutes Intravenous Every 8 hours 04/01/17 0225 04/03/17 0959      Subjective:   No complaints this AM  Objective:   Vitals:   05/26/17 2112 05/27/17 0051 05/27/17 0533 05/27/17 0944  BP: 108/61 110/63 (!) 118/59 94/76  Pulse: (!) 105 99 (!) 101 97  Resp: 20 20 20 20   Temp: 98.3 F (36.8 C) 98.5 F (36.9 C) 98.3 F (36.8 C) 98.4 F (36.9 C)  TempSrc: Oral Oral Oral Oral  SpO2: 98% 99% 98% 99%  Weight:      Height:        Intake/Output Summary (Last 24 hours) at 05/27/17 1237 Last data filed at 05/27/17 1100  Gross per 24 hour    Intake             1228 ml  Output             2000 ml  Net             -772 ml   Filed Weights   04/26/17 0416 04/26/17 2055 05/12/17 1112  Weight: 85.5 kg (188 lb 7.9 oz) 88.7 kg (195 lb 8.8 oz) 83.7 kg (184 lb 9.6 oz)   Exam  General: NAD, right side enlargement in the temporal region  Cardiovascular: rrr  Respiratory: clear  Abdomen: +BS, soft  Psych: flat affect  Data Reviewed: I have personally reviewed following labs and imaging studies  CBC:  Recent Labs Lab 05/21/17 0610 05/22/17 0459 05/26/17 0526  WBC 11.6* 11.6* 13.1*  HGB 13.5 12.7* 13.0  HCT 45.8 41.3 43.3  MCV 92.9 92.6 92.5  PLT 215 228 481   Basic Metabolic Panel:  Recent Labs Lab 05/21/17 0610 05/22/17 0459 05/26/17 0526  NA 136 135 137  K 4.3 4.3 4.3  CL 99* 100* 98*  CO2 30 26 30   GLUCOSE 124* 138* 122*  BUN 31* 29* 28*  CREATININE 0.77 0.71 0.80  CALCIUM 10.8* 10.3 10.5*   GFR: Estimated Creatinine Clearance: 106.5 mL/min (by C-G formula based on SCr of 0.8 mg/dL). Liver Function Tests: No results for input(s): AST, ALT, ALKPHOS, BILITOT, PROT, ALBUMIN in the last 168 hours. No results for input(s): LIPASE, AMYLASE in the last 168 hours. No results for input(s): AMMONIA in the last 168 hours. Coagulation Profile: No results for input(s): INR, PROTIME in the last 168 hours. Cardiac Enzymes: No results for input(s): CKTOTAL, CKMB, CKMBINDEX, TROPONINI in the last 168 hours. BNP (last 3 results) No results for input(s): PROBNP in the last 8760 hours. HbA1C: No results for input(s): HGBA1C in the last 72 hours. CBG:  Recent Labs Lab 05/26/17 1122 05/26/17 1609 05/26/17 2111 05/27/17 0607 05/27/17 1119  GLUCAP 134* 134* 90 101* 113*   Lipid Profile: No results for input(s): CHOL, HDL, LDLCALC, TRIG, CHOLHDL, LDLDIRECT in the last 72 hours. Thyroid Function Tests: No results for input(s): TSH, T4TOTAL, FREET4, T3FREE, THYROIDAB in the last 72 hours. Anemia Panel: No  results for input(s): VITAMINB12, FOLATE, FERRITIN, TIBC, IRON, RETICCTPCT in the last 72 hours. Urine analysis:    Component Value Date/Time   COLORURINE YELLOW 05/02/2017 1353   APPEARANCEUR CLOUDY (A) 05/02/2017 1353   LABSPEC 1.017 05/02/2017 1353   PHURINE 7.0 05/02/2017 1353   GLUCOSEU NEGATIVE 05/02/2017 1353   HGBUR NEGATIVE 05/02/2017 1353   BILIRUBINUR NEGATIVE 05/02/2017 1353   KETONESUR NEGATIVE 05/02/2017 1353   PROTEINUR 100 (A) 05/02/2017 1353  NITRITE NEGATIVE 05/02/2017 [place] 05/02/2017 1353     No results found for this or any previous visit (from the past 240 hour(s)).    Radiology Studies: Dg Chest Port 1 View  Result Date: 05/26/2017 CLINICAL DATA:  Cough EXAM: PORTABLE CHEST 1 VIEW COMPARISON:  05/13/2017 FINDINGS: Tracheostomy tube has been removed in the interval. Cardiac shadow is within normal limits. Lungs are well aerated bilaterally. No focal infiltrate or sizable effusion is seen. Mild degenerative changes of the thoracic spine are noted. IMPRESSION: No active disease. Electronically Signed   By: Inez Catalina M.D.   On: 05/26/2017 09:11     Scheduled Meds: . acetaminophen (TYLENOL) oral liquid 160 mg/5 mL  650 mg Per Tube TID  . apixaban  5 mg Per Tube BID  . benzonatate  200 mg Oral TID  . chlorhexidine  15 mL Mouth Rinse BID  . feeding supplement (ENSURE ENLIVE)  237 mL Oral BID BM  . feeding supplement (GLUCERNA 1.2 CAL)  1,000 mL Per Tube Q24H  . feeding supplement (PRO-STAT SUGAR FREE 64)  30 mL Per Tube BID  . free water  250 mL Per Tube Q8H  . gabapentin  300 mg Oral QHS  . glycopyrrolate  1 mg Per Tube BID  . insulin aspart  0-20 Units Subcutaneous TID WC  . insulin aspart  0-5 Units Subcutaneous QHS  . insulin detemir  34 Units Subcutaneous Daily  . metoprolol tartrate  12.5 mg Per Tube BID  . pantoprazole sodium  40 mg Per Tube Daily   Continuous Infusions:    LOS: 56 days   Time Spent in minutes   20  minutes  Mirai Greenwood U Jese Comella D.O. on 05/27/2017 at 12:37 PM    [facility] Office  [phone]

## 2017-05-28 LAB — GLUCOSE, CAPILLARY
GLUCOSE-CAPILLARY: 145 mg/dL — AB (ref 65–99)
GLUCOSE-CAPILLARY: 100 mg/dL — AB (ref 65–99)
Glucose-Capillary: 111 mg/dL — ABNORMAL HIGH (ref 65–99)
Glucose-Capillary: 83 mg/dL (ref 65–99)

## 2017-05-28 NOTE — Progress Notes (Signed)
PROGRESS NOTE    Joe Wilkinson  IZT:[id number] DOB: [birth year] DOA: 03/31/2017 PCP: Patient, No Pcp Per   No chief complaint on file.   Brief Narrative:   56yo M found unresponsive at bus-stop admitted to [facility] in DKA, concern for sepsis w/o source, later developed hemiparesis and found to have acute Right MCA CVA.  Transferred to [facility] ICU for NeuroSx care, seen by NSX and Neuro.  He was transferred to the ICU and underwent decompressive right craniectomy 8/25. Hypertonic saline protocol was completed. He required transfusion support and was treated for AFib with RVR, continued on the ventilator, eventually receiving trach 9/5 and PEG 9/6, on ATC 9/7. Patient does not have health insurance and because of his craniectomy without the ability to fit a helmet, most skilled nursing facilities areunwilling to accept him.  Currently he is getting meds thru PEG and continuous TF. Heparin was converted to prophylaxis Lovenox. He was transferred from [facility] service to [facility] service on 9/25. Currently awaiting SNF placement. PCCM continues to follow patient, getting closer to decannulation of the trach.   Assessment & Plan   Acute respiratory failure with hypoxia and hypercarbia  -Continue pulmonary hygiene  -s/p trach -PCCM continues to follow patient, s/p decannulation on 05/24/2017  Septic shock secondary to S. 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 Leonie Man, recommended  anticoagulation (FOBT negative), started on Eliquis  -LDL 38, hemoglobin A1C 14.3 -Repeat CT had showed further swelling in the region of the complete right MCA territory infarction with more extensive bulging of the brain through the craniectomy defect. No midline shift however because of decompressive craniotomy. At superior margin, there could be some extension of the swollen brain through a dural defect. At the superior margin, small amount of petechial bleeding within the region of the infarction. -PT/OT rec SNF- bed available on Monday  Atrial fibrillation with RVR, new onset -Currently in sinus rhythm -CHADSVASC 4 -Continue Eliquis and metoprolol  Diabetes mellitus, type II -Hemoglobin A1c 14.3 -Continue Levemir, insulin sliding scale CBG monitoring  Anemia blood loss -Unclear source -FOBT negative -Hemoglobin appears to be stable of the past several weeks -Continue to monitor CBC periodically   Escherichia coli, Klebsiella UTI -Completed 10 days of keflex on 05/15/2017  Nutrition/dysphagia -Speech therapy recommended dysphagia 1 diet, patient currently on tube feeds -Continue to monitor  Peri-anal cellulitis with small abscess -Gen. surgery consulted and appreciated, determined neurosurgical drainage necessary -Patient was on clindamycin through 04/29/2017  DVT Prophylaxis  Eliquis   Code Status: Full  Family Communication: None at bedside  Disposition Plan: SNF on Monday AM- [place] [facility] Neurosurgery General surgery   Procedures  -LP 8/24 >> glucose 205, RBC 16, protein 62, WBC 5 -CT head 8/24 >> Large Rt MCA -8/25 decompressive craniectomy -EEG 8/27 >> bilateral cerebral dysfunction, non-specific in etiology -Echo 8/27 >> EF 65 to 70% -CT abd/pelvis 8/30 >> third spacing, 3.5 cm low attenuation Lt hepatic lobe -CT Head 9/3>> Severe right hemisphere cytotoxic edema with stable extension of edematous brain through the right  craniectomy defect.  Stable petechial hemorrhage without malignant hemorrhagicTransformation. Stable intracranial mass effect with leftward midline shift of 7 mm. Small left PCA territory infarct. -9/5 Trach placement.  -9/6 PEG tube placement.  -CXR 9/23 > LLL atx and small effusion -Barrium swallow 9/21 > severe oral and mod pharyngeal dysphagia; mod to severe aspiration risk -CT head on 10/1 shows Further swelling in the region of the complete right MCA territory infarction with more extensive bulging of the brain through the craniectomy defect. No midline shift.  -10/17 Decannulation  Antibiotics   Anti-infectives    Start     Dose/Rate Route Frequency Ordered Stop   05/05/17 2200  cephALEXin (KEFLEX) capsule 500 mg     500 mg Oral Every 12 hours 05/05/17 1640 05/15/17 1042   04/26/17 0600  vancomycin (VANCOCIN) IVPB 750 mg/150 ml premix  Status:  Discontinued     750 mg 150 mL/hr over 60 Minutes Intravenous Every 12 hours 04/25/17 1729 04/25/17 1730   04/25/17 1830  clindamycin (CLEOCIN) capsule 300 mg     300 mg Oral Every 6 hours 04/25/17 1730 04/29/17 1717   04/25/17 1800  vancomycin (VANCOCIN) IVPB 750 mg/150 ml premix  Status:  Discontinued     750 mg 150 mL/hr over 60 Minutes Intravenous Every 12 hours 04/25/17 1728 04/25/17 1729   04/23/17 0945  piperacillin-tazobactam (ZOSYN) IVPB 3.375 g  Status:  Discontinued     3.375 g 12.5 mL/hr over 240 Minutes Intravenous Every 8 hours 04/23/17 0930 04/25/17 1730   04/23/17 0430  vancomycin (VANCOCIN) IVPB 1000 mg/200 mL premix  Status:  Discontinued     1,000 mg 200 mL/hr over 60 Minutes Intravenous Every 12 hours 04/22/17 1655 04/25/17 1728   04/22/17 1630  vancomycin (VANCOCIN) IVPB 1000 mg/200 mL premix     1,000 mg 200 mL/hr over 60 Minutes Intravenous  Once 04/22/17 1627 04/22/17 1826   04/08/17 0200  piperacillin-tazobactam (ZOSYN) IVPB 3.375 g  Status:  Discontinued     3.375 g 12.5 mL/hr over 240 Minutes Intravenous Every 8  hours 04/08/17 0058 04/15/17 1601   04/08/17 0100  piperacillin-tazobactam (ZOSYN) IVPB 3.375 g  Status:  Discontinued     3.375 g 100 mL/hr over 30 Minutes Intravenous Every 6 hours 04/08/17 0054 04/08/17 0057   04/03/17 1200  ampicillin (OMNIPEN) 2 g in sodium chloride 0.9 % 50 mL IVPB     2 g 150 mL/hr over 20 Minutes Intravenous Every 6 hours 04/03/17 1005 04/06/17 1837   04/01/17 1800  vancomycin (VANCOCIN) IVPB 1000 mg/200 mL premix  Status:  Discontinued     1,000 mg 200 mL/hr over 60 Minutes Intravenous Every 24 hours 04/01/17 0146 04/01/17 0154   04/01/17 1600  ceFAZolin (ANCEF) IVPB 2g/100 mL premix     2 g 200 mL/hr over 30 Minutes Intravenous Every 8 hours 04/01/17 1100 04/02/17 0056   04/01/17 0828  bacitracin 50,000 Units in sodium chloride irrigation 0.9 % 500 mL irrigation  Status:  Discontinued       As needed 04/01/17 0837 04/01/17 0947   04/01/17 0600  piperacillin-tazobactam (ZOSYN) IVPB 3.375 g  Status:  Discontinued     3.375 g 12.5 mL/hr over 240 Minutes Intravenous Every 8 hours 04/01/17 0146 04/01/17 0154   04/01/17 0600  vancomycin (VANCOCIN) IVPB 750 mg/150 ml premix  Status:  Discontinued     750 mg 150 mL/hr over 60 Minutes Intravenous Every 12 hours 04/01/17 0225 04/03/17 0959   04/01/17 0400  piperacillin-tazobactam (ZOSYN) IVPB 3.375  g  Status:  Discontinued     3.375 g 12.5 mL/hr over 240 Minutes Intravenous Every 8 hours 04/01/17 0225 04/03/17 0959      Subjective:   No overnight events  Objective:   Vitals:   05/28/17 0056 05/28/17 0421 05/28/17 0914 05/28/17 1333  BP: 120/78 108/68 (!) 91/55 119/86  Pulse: 100 93  92  Resp: 20 18 17 20   Temp: 98.2 F (36.8 C) 98.7 F (37.1 C) 98.5 F (36.9 C) 98.7 F (37.1 C)  TempSrc: Axillary Axillary Oral Oral  SpO2: 100% 95% 92% 100%  Weight:      Height:        Intake/Output Summary (Last 24 hours) at 05/28/17 1411 Last data filed at 05/28/17 0423  Gross per 24 hour  Intake               60  ml  Output              550 ml  Net             -490 ml   Filed Weights   04/26/17 0416 04/26/17 2055 05/12/17 1112  Weight: 85.5 kg (188 lb 7.9 oz) 88.7 kg (195 lb 8.8 oz) 83.7 kg (184 lb 9.6 oz)   Exam  General: in bed, NAD  Cardiovascular: rrr  Respiratory:no wheezing  Abdomen: +BS   Data Reviewed: I have personally reviewed following labs and imaging studies  CBC:  Recent Labs Lab 05/22/17 0459 05/26/17 0526  WBC 11.6* 13.1*  HGB 12.7* 13.0  HCT 41.3 43.3  MCV 92.6 92.5  PLT 228 562   Basic Metabolic Panel:  Recent Labs Lab 05/22/17 0459 05/26/17 0526  NA 135 137  K 4.3 4.3  CL 100* 98*  CO2 26 30  GLUCOSE 138* 122*  BUN 29* 28*  CREATININE 0.71 0.80  CALCIUM 10.3 10.5*   GFR: Estimated Creatinine Clearance: 106.5 mL/min (by C-G formula based on SCr of 0.8 mg/dL). Liver Function Tests: No results for input(s): AST, ALT, ALKPHOS, BILITOT, PROT, ALBUMIN in the last 168 hours. No results for input(s): LIPASE, AMYLASE in the last 168 hours. No results for input(s): AMMONIA in the last 168 hours. Coagulation Profile: No results for input(s): INR, PROTIME in the last 168 hours. Cardiac Enzymes: No results for input(s): CKTOTAL, CKMB, CKMBINDEX, TROPONINI in the last 168 hours. BNP (last 3 results) No results for input(s): PROBNP in the last 8760 hours. HbA1C: No results for input(s): HGBA1C in the last 72 hours. CBG:  Recent Labs Lab 05/27/17 1119 05/27/17 1653 05/27/17 2136 05/28/17 0605 05/28/17 1128  GLUCAP 113* 110* 83 111* 145*   Lipid Profile: No results for input(s): CHOL, HDL, LDLCALC, TRIG, CHOLHDL, LDLDIRECT in the last 72 hours. Thyroid Function Tests: No results for input(s): TSH, T4TOTAL, FREET4, T3FREE, THYROIDAB in the last 72 hours. Anemia Panel: No results for input(s): VITAMINB12, FOLATE, FERRITIN, TIBC, IRON, RETICCTPCT in the last 72 hours. Urine analysis:    Component Value Date/Time   COLORURINE YELLOW 05/02/2017  1353   APPEARANCEUR CLOUDY (A) 05/02/2017 1353   LABSPEC 1.017 05/02/2017 1353   PHURINE 7.0 05/02/2017 1353   GLUCOSEU NEGATIVE 05/02/2017 1353   HGBUR NEGATIVE 05/02/2017 1353   BILIRUBINUR NEGATIVE 05/02/2017 1353   KETONESUR NEGATIVE 05/02/2017 1353   PROTEINUR 100 (A) 05/02/2017 1353   NITRITE NEGATIVE 05/02/2017 1353   LEUKOCYTESUR NEGATIVE 05/02/2017 1353     No results found for this or any previous visit (from the past 240  hour(s)).    Radiology Studies: No results found.   Scheduled Meds: . acetaminophen (TYLENOL) oral liquid 160 mg/5 mL  650 mg Per Tube TID  . apixaban  5 mg Per Tube BID  . benzonatate  200 mg Oral TID  . chlorhexidine  15 mL Mouth Rinse BID  . feeding supplement (ENSURE ENLIVE)  237 mL Oral BID BM  . feeding supplement (GLUCERNA 1.2 CAL)  1,000 mL Per Tube Q24H  . feeding supplement (PRO-STAT SUGAR FREE 64)  30 mL Per Tube BID  . free water  250 mL Per Tube Q8H  . gabapentin  300 mg Oral QHS  . glycopyrrolate  1 mg Per Tube BID  . insulin aspart  0-20 Units Subcutaneous TID WC  . insulin aspart  0-5 Units Subcutaneous QHS  . insulin detemir  34 Units Subcutaneous Daily  . metoprolol tartrate  12.5 mg Per Tube BID  . pantoprazole sodium  40 mg Per Tube Daily   Continuous Infusions:    LOS: 57 days   Time Spent in minutes   20 minutes  Brodi Kari U Tavie Haseman D.O. on 05/28/2017 at 2:11 PM    [place]  [phone]

## 2017-05-29 ENCOUNTER — Inpatient Hospital Stay
Admission: RE | Admit: 2017-05-29 | Discharge: 2017-09-24 | Disposition: A | Discharge status: Still In Hospital | Payer: Medicaid Other | Source: Ambulatory Visit | Attending: Internal Medicine | Admitting: Internal Medicine

## 2017-05-29 DIAGNOSIS — T17908D Unspecified foreign body in respiratory tract, part unspecified causing other injury, subsequent encounter: Secondary | ICD-10-CM

## 2017-05-29 LAB — GLUCOSE, CAPILLARY
Glucose-Capillary: 151 mg/dL — ABNORMAL HIGH (ref 65–99)
Glucose-Capillary: 118 mg/dL — ABNORMAL HIGH (ref 65–99)

## 2017-05-29 MED ORDER — CHLORHEXIDINE GLUCONATE 0.12 % MT SOLN: 15.0000 mL | Freq: Two times a day (BID) | OROMUCOSAL | 0 refills | Status: DC

## 2017-05-29 MED ORDER — FREE WATER: 250.0000 mL | Freq: Three times a day (TID) | Status: DC

## 2017-05-29 MED ORDER — BISACODYL 10 MG RE SUPP: 10.0000 mg | Freq: Every day | RECTAL | 0 refills | Status: DC | PRN

## 2017-05-29 MED ORDER — INSULIN DETEMIR 100 UNIT/ML ~~LOC~~ SOLN: 34.0000 [IU] | Freq: Every day | SUBCUTANEOUS | 11 refills | Status: DC

## 2017-05-29 MED ORDER — DOCUSATE SODIUM 50 MG/5ML PO LIQD: 100.0000 mg | Freq: Two times a day (BID) | ORAL | 0 refills | Status: DC | PRN

## 2017-05-29 MED ORDER — GLUCERNA 1.2 CAL PO LIQD: 1000.0000 mL | ORAL | Status: DC

## 2017-05-29 MED ORDER — GLYCOPYRROLATE 1 MG PO TABS: 1.0000 mg | ORAL_TABLET | Freq: Two times a day (BID) | ORAL | Status: DC

## 2017-05-29 MED ORDER — GUAIFENESIN-CODEINE 100-10 MG/5ML PO SOLN: 5.0000 mL | ORAL | 0 refills | Status: DC | PRN

## 2017-05-29 MED ORDER — GABAPENTIN 300 MG PO CAPS: 300.0000 mg | ORAL_CAPSULE | Freq: Every day | ORAL | Status: DC

## 2017-05-29 MED ORDER — METOPROLOL TARTRATE 25 MG PO TABS: 12.5000 mg | ORAL_TABLET | Freq: Two times a day (BID) | ORAL | Status: DC

## 2017-05-29 MED ORDER — IBUPROFEN 400 MG PO TABS: 400.0000 mg | ORAL_TABLET | Freq: Four times a day (QID) | ORAL | 0 refills | Status: DC | PRN

## 2017-05-29 MED ORDER — PANTOPRAZOLE SODIUM 40 MG PO PACK: 40.0000 mg | PACK | Freq: Every day | ORAL | Status: DC

## 2017-05-29 MED ORDER — ENSURE ENLIVE PO LIQD: 237.0000 mL | Freq: Two times a day (BID) | ORAL | 12 refills | Status: DC

## 2017-05-29 MED ORDER — BENZONATATE 200 MG PO CAPS: 200.0000 mg | ORAL_CAPSULE | Freq: Three times a day (TID) | ORAL | 0 refills | Status: DC

## 2017-05-29 MED ORDER — APIXABAN 5 MG PO TABS: 5.0000 mg | ORAL_TABLET | Freq: Two times a day (BID) | ORAL | Status: DC

## 2017-05-29 MED ORDER — PRO-STAT SUGAR FREE PO LIQD: 30.0000 mL | Freq: Two times a day (BID) | ORAL | 0 refills | Status: DC

## 2017-05-29 NOTE — Clinical Social Work Placement (Signed)
Nurse to call report to [phone], Room [place]  NOTE  Date:  05/29/2017  Patient Details  Name: Joe Wilkinson MRN: [id number] Date of Birth: 24-Apr-1961  Clinical Social Work is seeking post-discharge placement for this patient at the [place] level of care (*CSW will initial, date and re-position this form in  chart as items are completed):  Yes   Patient/family provided with [facility] Work Department's list of facilities offering this level of care within the geographic area requested by the patient (or if unable, by the patient's family).  Yes   Patient/family informed of their freedom to choose among providers that offer the needed level of care, that participate in Medicare, Medicaid or managed care program needed by the patient, have an available bed and are willing to accept the patient.  Yes   Patient/family informed of [facility]'s ownership interest in [facility] and [facility], as well as of the fact that they are under no obligation to receive care at these facilities.  PASRR submitted to EDS on       PASRR number received on       Existing PASRR number confirmed on       FL2 transmitted to all facilities in geographic area requested by pt/family on       FL2 transmitted to all facilities within larger geographic area on 05/29/17     Patient informed that his/her managed care company has contracts with or will negotiate with certain facilities, including the following:        Yes   Patient/family informed of bed offers received.  Patient chooses bed at [facility]     Physician recommends and patient chooses bed at      Patient to be transferred to [facility] on 05/29/17.  Patient to be transferred to facility by [facility]     Patient family notified on 05/29/17 of transfer.  Name of family member notified:  Lurena Joiner     PHYSICIAN       Additional Comment:     _______________________________________________ Geralynn Ochs, LCSW 05/29/2017, 2:07 PM

## 2017-05-29 NOTE — NC FL2 (Signed)
[place] LEVEL OF CARE SCREENING TOOL     IDENTIFICATION  Patient Name: Joe Wilkinson Birthdate: 13-Feb-1961 Sex: male Admission Date (Current Location): 03/31/2017  [place] and [place] Number:   [facility])   Facility and Address:  [facility] [place], [place] [address], [place], [place] [address]      Provider Number: [id number]  Attending Physician Name and Address:  Cristal Ford, DO  Relative Name and Phone Number:       Current Level of Care: Hospital Recommended Level of Care: [place] Prior Approval Number:    Date Approved/Denied:   PASRR Number: [id number]  Discharge Plan: SNF    Current Diagnoses: Patient Active Problem List   Diagnosis Date Noted  . Acute on chronic respiratory failure ([place])   . Tracheostomy status ([place])   . Acute blood loss anemia 04/27/2017  . Headache 04/27/2017  . AF (paroxysmal atrial fibrillation) ([place]) 04/27/2017  . Encounter for tracheostomy tube change ([place])   . Ventilator dependent ([place])   . Endotracheal tube present   . Acute respiratory failure ([place])   . Compromised airway   . CVA (cerebral vascular accident) ([place]) 04/01/2017  . Cytotoxic cerebral edema ([place]) 04/01/2017  . 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 DKA (diabetic ketoacidoses) ([place]) 03/30/2017  . Encounter for central line placement   . Encounter for imaging study to confirm orogastric (OG) tube placement   . Hypovolemic shock (H[facility])     Orientation RESPIRATION BLADDER Height & Weight     Self, Place, Time  Normal Incontinent, External catheter Weight: 184 lb 9.6 oz (83.7 kg) Height:  5\' 10"  (177.8 cm)  BEHAVIORAL SYMPTOMS/MOOD NEUROLOGICAL BOWEL NUTRITION STATUS      Incontinent Feeding tube  AMBULATORY STATUS COMMUNICATION OF NEEDS Skin   Extensive Assist Verbally Surgical wounds                       Personal Care Assistance Level of Assistance  Bathing, Feeding, Dressing Bathing  Assistance: Maximum assistance Feeding assistance: Maximum assistance Dressing Assistance: Maximum assistance     Functional Limitations Info  Speech     Speech Info: Adequate    SPECIAL CARE FACTORS FREQUENCY  PT (By licensed PT), OT (By licensed OT), Speech therapy     PT Frequency: 5/wk OT Frequency: 5/wk     Speech Therapy Frequency: 3/wk      Contractures      Additional Factors Info  Code Status, Allergies, Insulin Sliding Scale Code Status Info: FULL Allergies Info: NKA   Insulin Sliding Scale Info: 7/day       Current Medications (05/29/2017):  This is the current hospital active medication list Current Facility-Administered Medications  Medication Dose Route Frequency Provider Last Rate Last Dose  . acetaminophen (TYLENOL) solution 650 mg  650 mg Per Tube TID Janece Canterbury, MD   650 mg at 05/29/17 1044  . apixaban (ELIQUIS) tablet 5 mg  5 mg Per Tube BID Rai, Ripudeep K, MD   5 mg at 05/29/17 1045  . benzonatate (TESSALON) capsule 200 mg  200 mg Oral TID Cristal Ford, DO   200 mg at 05/29/17 1045  . bisacodyl (DULCOLAX) suppository 10 mg  10 mg Rectal Daily PRN Chesley Mires, MD      . chlorhexidine (PERIDEX) 0.12 % solution 15 mL  15 mL Mouth Rinse BID Patrecia Pour, MD   15 mL at 05/29/17 1045  . docusate (COLACE) 50 MG/5ML liquid 100  mg  100 mg Per Tube BID PRN Chesley Mires, MD   100 mg at 04/12/17 1359  . feeding supplement (ENSURE ENLIVE) (ENSURE ENLIVE) liquid 237 mL  237 mL Oral BID BM Mikhail, Maryann, DO   237 mL at 05/29/17 1000  . feeding supplement (GLUCERNA 1.2 CAL) liquid 1,000 mL  1,000 mL Per Tube Q24H Cristal Ford, DO   Stopped at 05/29/17 0831  . feeding supplement (PRO-STAT SUGAR FREE 64) liquid 30 mL  30 mL Per Tube BID Rayburn, Kelly A, PA-C   30 mL at 05/29/17 1045  . free water 250 mL  250 mL Per Tube Christiana Pellant, MD   250 mL at 05/29/17 0552  . gabapentin (NEURONTIN) capsule 300 mg  300 mg Oral Hettie Holstein, MD    300 mg at 05/28/17 2324  . glycopyrrolate (ROBINUL) tablet 1 mg  1 mg Per Tube BID Janece Canterbury, MD   1 mg at 05/29/17 1045  . guaiFENesin-codeine 100-10 MG/5ML solution 5 mL  5 mL Oral Q4H PRN Cristal Ford, DO   5 mL at 05/27/17 2148  . ibuprofen (ADVIL,MOTRIN) tablet 400 mg  400 mg Oral Q6H PRN Janece Canterbury, MD   400 mg at 05/29/17 0826  . insulin aspart (novoLOG) injection 0-20 Units  0-20 Units Subcutaneous TID WC Hosie Poisson, MD   4 Units at 05/29/17 0826  . insulin aspart (novoLOG) injection 0-5 Units  0-5 Units Subcutaneous QHS Hosie Poisson, MD   Stopped at 05/08/17 2314  . insulin detemir (LEVEMIR) injection 34 Units  34 Units Subcutaneous Daily Mannam, Praveen, MD   34 Units at 05/29/17 1045  . ipratropium-albuterol (DUONEB) 0.5-2.5 (3) MG/3ML nebulizer solution 3 mL  3 mL Nebulization Q2H PRN Chesley Mires, MD      . metoprolol tartrate (LOPRESSOR) tablet 12.5 mg  12.5 mg Per Tube BID Baggett, Brooke A, RPH   12.5 mg at 05/29/17 1044  . ondansetron (ZOFRAN) injection 4 mg  4 mg Intravenous Q4H PRN Ditty, Kevan Ny, MD   4 mg at 04/25/17 1733  . pantoprazole sodium (PROTONIX) 40 mg/20 mL oral suspension 40 mg  40 mg Per Tube Daily Nita Sells, MD   40 mg at 05/28/17 1242  . promethazine (PHENERGAN) tablet 12.5-25 mg  12.5-25 mg Per Tube Q4H PRN Rayburn, Kelly A, PA-C   25 mg at 05/20/17 1843     Discharge Medications: Please see discharge summary for a list of discharge medications.  Relevant Imaging Results:  Relevant Lab Results:   Additional Information SS#: [id number]  Geralynn Ochs, LCSW

## 2017-05-29 NOTE — Discharge Summary (Addendum)
Physician Discharge Summary  Joe Wilkinson BMW:[id number] DOB: 12-06-1960 DOA: 03/31/2017  PCP: Patient, No Pcp Per  Admit date: 03/31/2017 Discharge date: 05/29/2017  Time spent: 45 minutes  Recommendations for Outpatient Follow-up:  Patient will be discharged to [facility], continue physical and occupational therapy.  Patient will need to follow up with primary care provider within one week of discharge.  Follow up with Dr. Leonie Man, neurology, in 6 weeks. Follow up with neurosurgery in 3 weeks. Patient should continue medications as prescribed.  Patient should follow a dysphagia 3 diet, along with tube feeds from 7pm to 7am at 28ml/hr.   Discharge Diagnoses:  Acute respiratory failure with hypoxia and hypercarbia  Septic shock secondary to S. 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 Hanley Hays (PCCM) [phone] M found unresponsive at bustop admitted to [facility] in DKA, concern for sepsis w/o source, later developed hemiparesis and found to have acute Right MCA CVA. Now transferred to [facility] ICU for NeuroSx care, seen by NSX and Neuro. Tentative plan for crani in AM. He is intubated and sedated and unable to provide any HPI, ROS, PMH, PSH, FH, home meds, allergies. All Hx form medical record.on 04/01/2017 by Dr. Hanley Hays (PCCM) [phone] M found unresponsive at bustop admitted to [facility] in DKA, concern for sepsis w/o source, later developed hemiparesis and found to have acute Right MCA CVA. Now  transferred to [facility] ICU for NeuroSx care, seen by NSX and Neuro. Tentative plan for crani in AM. He is intubated and sedated and unable to provide any HPI, ROS, PMH, PSH, FH, home meds, allergies. All Hx form medical record.  Hospital Course:  Interim history  He was transferred to the ICU and underwent decompressive right craniectomy 8/25. Hypertonic saline protocol was completed. He required transfusion support and was treated for AFib with RVR, continued on the ventilator, eventually receiving trach 9/5 and PEG 9/6, on ATC 9/7. Patient does not have health insurance and because of his craniectomy without the ability to fit a helmet, most skilled nursing facilities areunwilling to accept him. Currently he is getting meds thru PEG and continuous TF. Heparin was converted to prophylaxis Lovenox. He was transferred from [facility] service to [facility] service on 9/25. Currently awaiting SNF placement. PCCM continued to follow patient,s/p decannulation of the trach.   Acute respiratory failure with hypoxia and hypercarbia  -Continue pulmonary hygiene  -s/p trach -PCCM continued to follow patient, s/p decannulation on 05/24/2017  Septic shock secondary to S. 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 Leonie Man, recommended anticoagulation (FOBT negative), started on  Eliquis  -LDL 38, hemoglobin A1C 14.3 -Repeat  CT had showed further swelling in the region of the complete right MCA territory infarction with more extensive bulging of the brain through the craniectomy defect. No midline shift however because of decompressive craniotomy. At superior margin, there could be some extension of the swollen brain through a dural defect. At the superior margin, small amount of petechial bleeding within the region of the infarction. -PT/OT rec SNF- patient will be discharged to [facility] -Follow up with neurology and neurosurgery  Atrial fibrillation with RVR, new onset -Currently in sinus rhythm -CHADSVASC 4 -Patient was not placed on anticoagulation as he was found to be anemic and required 4 units PRBC transfusion this admission -Continue Eliquis and metoprolol  Insulin dependent diabetes mellitus -Hemoglobin A1c 14.3 -per review of chart, patient had DKA prior to admission to [facility], which had resolved -Continue Levemir, insulin sliding scale CBG monitoring -was on metformin as an outpatient, however had not filled script in >1 year  Anemia blood loss -Unclear source -Abdominal x-ray does not show bleeding, FOBT negative -Hemoglobin appears to be stable of the past several weeks -Continue to monitor CBC periodically   Escherichia coli, Klebsiella UTI -Completed 10 days of keflex on 05/15/2017  Nutrition/dysphagia -Speech therapy recommended dysphagia 1 diet, patient currently on tube feeds -Patient did have some abdominal distention on 05/06/2017, abdominal film does not show ileus or obstruction -Continue to monitor  Peri-anal cellulitis with small abscess -Gen. surgery consulted and appreciated, determined neurosurgical drainage necessary -Patient was on clindamycin through 04/29/2017  Consultants PCCM Neurology Neurosurgery General surgery   Procedures  -LP 8/24 >> glucose 205, RBC 16, protein 62, WBC 5 -CT head 8/24 >> Large Rt MCA -8/25 decompressive craniectomy -EEG  8/27 >> bilateral cerebral dysfunction, non-specific in etiology -Echo 8/27 >> EF 65 to 70% -CT abd/pelvis 8/30 >> third spacing, 3.5 cm low attenuation Lt hepatic lobe -CT Head 9/3>> Severe right hemisphere cytotoxic edema with stable extension of edematous brain through the right craniectomy defect. Stable petechial hemorrhage without malignant hemorrhagicTransformation. Stable intracranial mass effect with leftward midline shift of 7 mm. Small left PCA territory infarct. -9/5 Trach placement.  -9/6 PEG tube placement.  -CXR 9/23 > LLL atx and small effusion -Barrium swallow 9/21 > severe oral and mod pharyngeal dysphagia; mod to severe aspiration risk -CT head on 10/1 shows Further swelling in the region of the complete right MCA territory infarction with more extensive bulging of the brain through the craniectomy defect. No midline shift.  -10/17 Decannulation  Discharge Exam: Vitals:   05/29/17 0643 05/29/17 0857  BP: 123/80 117/81  Pulse: (!) 104 98  Resp: 18 20  Temp: 98.3 F (36.8 C) 97.9 F (36.6 C)  SpO2: 96% 100%   Continues to have mild headache. Denies chest pain, shortness of breath, abdominal pain, nausea, vomiting.    General: Well developed, well nourished, NAD, appears stated age  36: NCAT,mucous membranes moist. Right craniectomy  Cardiovascular: S1 S2 auscultated, no rubs, murmurs or gallops. Regular rate and rhythm.  Respiratory: Clear to auscultation bilaterally with equal chest rise  Abdomen: Soft, nontender, nondistended, + bowel sounds, peg in place  Extremities: warm dry without cyanosis clubbing or edema  Neuro: AAOx3, left sided weakness, no new deficits   Psych: Appropriate and pleasant   Discharge Instructions Discharge Instructions    Discharge instructions    Complete by:  As directed    Patient will be discharged to [facility], continue physical  and occupational therapy.  Patient will need to follow up with primary care provider within  one week of discharge.  Follow up with Dr. Leonie Man, neurology, in 6 weeks. Follow up with neurosurgery in 3 weeks. Patient should continue medications as prescribed.  Patient should follow a dysphagia 3 diet.     Current Discharge Medication List    START taking these medications   Details  Amino Acids-Protein Hydrolys (FEEDING SUPPLEMENT, PRO-STAT SUGAR FREE 64,) LIQD Place 30 mLs into feeding tube 2 (two) times daily. Qty: 900 mL, Refills: 0    apixaban (ELIQUIS) 5 MG TABS tablet Place 1 tablet (5 mg total) into feeding tube 2 (two) times daily. Qty: 60 tablet    benzonatate (TESSALON) 200 MG capsule Take 1 capsule (200 mg total) by mouth 3 (three) times daily. Qty: 20 capsule, Refills: 0    bisacodyl (DULCOLAX) 10 MG suppository Place 1 suppository (10 mg total) rectally daily as needed for moderate constipation. Qty: 12 suppository, Refills: 0    chlorhexidine (PERIDEX) 0.12 % solution 15 mLs by Mouth Rinse route 2 (two) times daily. Qty: 120 mL, Refills: 0    docusate (COLACE) 50 MG/5ML liquid Place 10 mLs (100 mg total) into feeding tube 2 (two) times daily as needed for mild constipation. Qty: 100 mL, Refills: 0    !! feeding supplement, ENSURE ENLIVE, (ENSURE ENLIVE) LIQD Take 237 mLs by mouth 2 (two) times daily between meals. Qty: 237 mL, Refills: 12    gabapentin (NEURONTIN) 300 MG capsule Take 1 capsule (300 mg total) by mouth at bedtime.    glycopyrrolate (ROBINUL) 1 MG tablet Place 1 tablet (1 mg total) into feeding tube 2 (two) times daily.    guaiFENesin-codeine 100-10 MG/5ML syrup Take 5 mLs by mouth every 4 (four) hours as needed for cough. Qty: 120 mL, Refills: 0    ibuprofen (ADVIL,MOTRIN) 400 MG tablet Take 1 tablet (400 mg total) by mouth every 6 (six) hours as needed (breakthrough for headache). Qty: 30 tablet, Refills: 0    metoprolol tartrate (LOPRESSOR) 25 MG tablet Place 0.5 tablets (12.5 mg total) into feeding tube 2 (two) times daily.    !!  Nutritional Supplements (FEEDING SUPPLEMENT, GLUCERNA 1.2 CAL,) LIQD Place 1,000 mLs into feeding tube daily.    pantoprazole sodium (PROTONIX) 40 mg/20 mL PACK Place 20 mLs (40 mg total) into feeding tube daily. Qty: 30 each    Water For Irrigation, Sterile (FREE WATER) SOLN Place 250 mLs into feeding tube every 8 (eight) hours.     !! - Potential duplicate medications found. Please discuss with provider.    CONTINUE these medications which have CHANGED   Details  insulin detemir (LEVEMIR) 100 UNIT/ML injection Inject 0.34 mLs (34 Units total) into the skin daily. Qty: 10 mL, Refills: 11      CONTINUE these medications which have NOT CHANGED   Details  insulin aspart (NOVOLOG) 100 UNIT/ML injection Inject 10 Units into the skin 3 (three) times daily before meals.      STOP taking these medications     lisinopril (PRINIVIL,ZESTRIL) 10 MG tablet      metFORMIN (GLUCOPHAGE-XR) 500 MG 24 hr tablet        No Known Allergies Follow-up Information    Costella, Yancarlos Berthold, PA-C Follow up in 3 week(s).   Specialty:  Physician Assistant Contact information: [place] [address] [phone]        Garvin Fila, MD. Schedule an appointment as soon as  possible for a visit in 6 week(s).   Specialties:  Neurology, Radiology Why:  Hospital follow up Contact information: [address] [place] [address] [phone]            The results of significant diagnostics from this hospitalization (including imaging, microbiology, ancillary and laboratory) are listed below for reference.    Significant Diagnostic Studies: Ct Head Wo Contrast  Result Date: 05/08/2017 CLINICAL DATA:  Followup craniectomy.  Headache. EXAM: CT HEAD WITHOUT CONTRAST TECHNIQUE: Contiguous axial images were obtained from the base of the skull through the vertex without intravenous contrast. COMPARISON:  04/10/2017 and multiple previous FINDINGS: Brain: Massive swelling  in the region of the complete right MCA territory infarction with more protrusion of the infarcted brain tissue through the craniectomy defect. Mild petechial bleeding within the superior portion of the infarction but without frank hematoma. No new vascular territory involvement. No midline shift because of the decompressive craniectomy. No ventricular trapping. No extra-axial collection. Along the upper region of the craniectomy, I wonder if there could actually be a dural defect through which some of the swollen brain has extended. Vascular: No other vascular finding. Skull: No unexpected finding related to the craniectomy. Sinuses/Orbits: No significant sinus finding. Old blowout fracture on the right. Other: None significant IMPRESSION: Further swelling in the region of the complete right MCA territory infarction with more extensive bulging of the brain through the craniectomy defect. No midline shift however, because of the decompressive craniectomy. At the superior margin, there could be some extension of the swollen brain through a dural defect. Also at the superior margin, there is a small amount of petechial bleeding within the region of infarction. Electronically Signed   By: Nelson Chimes M.D.   On: 05/08/2017 14:12   Dg Chest Port 1 View  Result Date: 05/26/2017 CLINICAL DATA:  Cough EXAM: PORTABLE CHEST 1 VIEW COMPARISON:  05/13/2017 FINDINGS: Tracheostomy tube has been removed in the interval. Cardiac shadow is within normal limits. Lungs are well aerated bilaterally. No focal infiltrate or sizable effusion is seen. Mild degenerative changes of the thoracic spine are noted. IMPRESSION: No active disease. Electronically Signed   By: Inez Catalina M.D.   On: 05/26/2017 09:11   Dg Chest Port 1 View  Result Date: 05/13/2017 CLINICAL DATA:  Leukocytosis. EXAM: PORTABLE CHEST 1 VIEW COMPARISON:  05/05/2017 FINDINGS: Tracheostomy remains in place. There is mild atelectasis at both lung bases. No  consolidation or lobar collapse. Upper lungs are clear. No heart failure or effusion. IMPRESSION: Mild bibasilar atelectasis. Electronically Signed   By: Nelson Chimes M.D.   On: 05/13/2017 08:55   Dg Chest Port 1 View  Result Date: 05/05/2017 CLINICAL DATA:  Neck pain EXAM: PORTABLE CHEST 1 VIEW COMPARISON:  Chest radiograph 04/30/2017 FINDINGS: Tracheostomy tube tip is at the level of the clavicular heads. Cardiomediastinal contours are unchanged. No focal airspace consolidation or pulmonary edema. No pneumothorax. Unchanged probable left pleural effusion and associated atelectasis. IMPRESSION: Unchanged small left pleural effusion and associated atelectasis. Electronically Signed   By: Ulyses Jarred M.D.   On: 05/05/2017 20:58   Dg Chest Port 1 View  Result Date: 04/30/2017 CLINICAL DATA:  Respiratory failure EXAM: PORTABLE CHEST 1 VIEW COMPARISON:  04/24/2017 FINDINGS: Left lower lobe airspace disease has progressed in the interval. Small left effusion. Right lung clear. Tracheostomy remains in good position IMPRESSION: Progression of mild left lower lobe atelectasis/ infiltrate and small left effusion. Electronically Signed   By: Franchot Gallo  M.D.   On: 04/30/2017 15:20   Dg Abd 2 Views  Result Date: 05/06/2017 CLINICAL DATA:  Abdominal distension. EXAM: ABDOMEN - 2 VIEW COMPARISON:  May 02, 2017. FINDINGS: No abnormal bowel dilatation is noted. Gastrostomy tube is seen projected over gastric air bubble. Residual contrast is seen in the colon. No abnormal calcifications are noted. IMPRESSION: No evidence of bowel obstruction or ileus. Electronically Signed   By: Marijo Conception, M.D.   On: 05/06/2017 21:02   Dg Abd Portable 1v  Result Date: 05/02/2017 CLINICAL DATA:  Abdomen pain EXAM: PORTABLE ABDOMEN - 1 VIEW COMPARISON:  None. FINDINGS: The bowel gas pattern is normal. No radio-opaque calculi or other significant radiographic abnormality are seen. Surgical clips are projected over the  right pelvis. IMPRESSION: No acute abnormality. Electronically Signed   By: Abelardo Diesel M.D.   On: 05/02/2017 15:33   Dg Swallowing Func-speech Pathology  Result Date: 05/05/2017 Objective Swallowing Evaluation: Type of Study: MBS-Modified Barium Swallow Study Patient Details Name: Joe Wilkinson MRN: [id number] Date of Birth: 07-09-1961 Today's Date: 05/05/2017 Time: SLP Start Time (ACUTE ONLY): 1350-SLP Stop Time (ACUTE ONLY): 1410 SLP Time Calculation (min) (ACUTE ONLY): 20 min Past Medical History: No past medical history on file. Past Surgical History: Past Surgical History: Procedure Laterality Date . CRANIECTOMY Right 04/01/2017  Procedure: RIGHT DECOMPRESSIVE CRANIECTOMY;  Surgeon: Ditty, Kevan Ny, MD;  Location: [place];  Service: Neurosurgery;  Laterality: Right; . ESOPHAGOGASTRODUODENOSCOPY N/A 04/13/2017  Procedure: ESOPHAGOGASTRODUODENOSCOPY (EGD);  Surgeon: Georganna Skeans, MD;  Location: [place];  Service: General;  Laterality: N/A;  bedside . PEG PLACEMENT N/A 04/13/2017  Procedure: PERCUTANEOUS ENDOSCOPIC GASTROSTOMY (PEG) PLACEMENT;  Surgeon: Georganna Skeans, MD;  Location: [facility] ENDOSCOPY;  Service: General;  Laterality: N/A; HPI: 56 yo male brought to [facility] with altered mental status from DKA and sepsis. Found to have RT MCA CVA and transfer to [facility]. Required Rt decompressive craniectomy and remained on vent post op.ETT 8/23-9/5, s/p tracheostomy 9/5.  Subjective: pt eager for POs Assessment / Plan / Recommendation CHL IP CLINICAL IMPRESSIONS 05/05/2017 Clinical Impression  Pt demonstrates cognitive impairment impacting swallow function. Prolonged liangual rocking and pumping occurs with most boluses, particulalry purees, though a liquid wash is beneficial to facilitate oral transit. Swallow response is delayed reuslting in flash penetration events. Otherwise oropharyngeal mechanism is WNL. Pt may initaite a puree/thin diet though intake may be poor and assistance will be needed for attention  with feeding and to avoid prolonged oral holding. PMSV to be in place with all PO. WIll f/u for tolerance.  SLP Visit Diagnosis Dysphagia, oropharyngeal phase (R13.12) Attention and concentration deficit following -- Frontal lobe and executive function deficit following -- Impact on safety and function --   CHL IP TREATMENT RECOMMENDATION 05/05/2017 Treatment Recommendations Therapy as outlined in treatment plan below   Prognosis 05/05/2017 Prognosis for Safe Diet Advancement Good Barriers to Reach Goals -- Barriers/Prognosis Comment -- CHL IP DIET RECOMMENDATION 05/05/2017 SLP Diet Recommendations Thin liquid;Dysphagia 1 (Puree) solids Liquid Administration via Cup;Straw Medication Administration Crushed with puree Compensations Minimize environmental distractions;Follow solids with liquid Postural Changes Seated upright at 90 degrees   CHL IP OTHER RECOMMENDATIONS 05/05/2017 Recommended Consults -- Oral Care Recommendations Other (Comment);Oral care BID Other Recommendations Have oral suction available;Place PMSV during PO intake   CHL IP FOLLOW UP RECOMMENDATIONS 05/05/2017 Follow up Recommendations Skilled Nursing facility   [facility] IP FREQUENCY AND DURATION 05/05/2017 Speech Therapy Frequency (ACUTE ONLY) min 2x/week Treatment Duration 2 weeks  CHL IP ORAL PHASE 05/05/2017 Oral Phase Impaired Oral - Pudding Teaspoon -- Oral - Pudding Cup -- Oral - Honey Teaspoon NT Oral - Honey Cup -- Oral - Nectar Teaspoon -- Oral - Nectar Cup -- Oral - Nectar Straw NT Oral - Thin Teaspoon NT Oral - Thin Cup -- Oral - Thin Straw Holding of bolus;Lingual pumping Oral - Puree Lingual pumping;Holding of bolus Oral - Mech Soft -- Oral - Regular Lingual pumping;Holding of bolus Oral - Multi-Consistency -- Oral - Pill -- Oral Phase - Comment --  CHL IP PHARYNGEAL PHASE 05/05/2017 Pharyngeal Phase Impaired Pharyngeal- Pudding Teaspoon -- Pharyngeal -- Pharyngeal- Pudding Cup -- Pharyngeal -- Pharyngeal- Honey Teaspoon NT Pharyngeal --  Pharyngeal- Honey Cup -- Pharyngeal -- Pharyngeal- Nectar Teaspoon -- Pharyngeal -- Pharyngeal- Nectar Cup -- Pharyngeal -- Pharyngeal- Nectar Straw NT Pharyngeal -- Pharyngeal- Thin Teaspoon NT Pharyngeal -- Pharyngeal- Thin Cup -- Pharyngeal -- Pharyngeal- Thin Straw Delayed swallow initiation-pyriform sinuses;Penetration/Aspiration during swallow Pharyngeal Material enters airway, remains ABOVE vocal cords then ejected out;Material does not enter airway Pharyngeal- Puree Delayed swallow initiation-pyriform sinuses Pharyngeal -- Pharyngeal- Mechanical Soft Delayed swallow initiation-pyriform sinuses Pharyngeal -- Pharyngeal- Regular -- Pharyngeal -- Pharyngeal- Multi-consistency -- Pharyngeal -- Pharyngeal- Pill -- Pharyngeal -- Pharyngeal Comment --  CHL IP CERVICAL ESOPHAGEAL PHASE 04/28/2017 Cervical Esophageal Phase WFL Pudding Teaspoon -- Pudding Cup -- Honey Teaspoon -- Honey Cup -- Nectar Teaspoon -- Nectar Cup -- Nectar Straw -- Thin Teaspoon -- Thin Cup -- Thin Straw -- Puree -- Mechanical Soft -- Regular -- Multi-consistency -- Pill -- Cervical Esophageal Comment -- No flowsheet data found. DeBlois, Katherene Ponto 05/05/2017, 3:25 PM               Microbiology: No results found for this or any previous visit (from the past 240 hour(s)).   Labs: Basic Metabolic Panel:  Recent Labs Lab 05/26/17 0526  NA 137  K 4.3  CL 98*  CO2 30  GLUCOSE 122*  BUN 28*  CREATININE 0.80  CALCIUM 10.5*   Liver Function Tests: No results for input(s): AST, ALT, ALKPHOS, BILITOT, PROT, ALBUMIN in the last 168 hours. No results for input(s): LIPASE, AMYLASE in the last 168 hours. No results for input(s): AMMONIA in the last 168 hours. CBC:  Recent Labs Lab 05/26/17 0526  WBC 13.1*  HGB 13.0  HCT 43.3  MCV 92.5  PLT 213   Cardiac Enzymes: No results for input(s): CKTOTAL, CKMB, CKMBINDEX, TROPONINI in the last 168 hours. BNP: BNP (last 3 results) No results for input(s): BNP in the last  8760 hours.  ProBNP (last 3 results) No results for input(s): PROBNP in the last 8760 hours.  CBG:  Recent Labs Lab 05/28/17 0605 05/28/17 1128 05/28/17 1646 05/28/17 2101 05/29/17 0638  GLUCAP 111* 145* 100* 83 151*       Signed:  Kia Varnadore  [facility] 05/29/2017, 11:16 AM

## 2017-05-29 NOTE — Care Management Note (Signed)
Case Management Note  Patient Details  Name: Joe Wilkinson MRN: [id number] Date of Birth: Feb 25, 1961  Subjective/Objective:                    Action/Plan: Pt discharging to [facility] today. No further needs per CM.   Expected Discharge Date:  05/29/17               Expected Discharge Plan:  [place]  In-House Referral:  Clinical Social Work, Scientist, research (medical)  CM Consult  Post Acute Care Choice:    Choice offered to:     DME Arranged:    DME Agency:     HH Arranged:    [place] Agency:     Status of Service:  Completed, signed off  If discussed at [employer], dates discussed:    Additional Comments:  Pollie Friar, RN 05/29/2017, 12:50 PM

## 2017-05-30 ENCOUNTER — Non-Acute Institutional Stay (SKILLED_NURSING_FACILITY): Payer: Medicaid Other | Admitting: Internal Medicine

## 2017-05-30 ENCOUNTER — Encounter: Payer: Self-pay | Admitting: Internal Medicine

## 2017-05-30 DIAGNOSIS — E108 Type 1 diabetes mellitus with unspecified complications: Secondary | ICD-10-CM | POA: Diagnosis not present

## 2017-05-30 DIAGNOSIS — D62 Acute posthemorrhagic anemia: Secondary | ICD-10-CM | POA: Diagnosis not present

## 2017-05-30 DIAGNOSIS — I10 Essential (primary) hypertension: Secondary | ICD-10-CM | POA: Insufficient documentation

## 2017-05-30 DIAGNOSIS — I480 Paroxysmal atrial fibrillation: Secondary | ICD-10-CM | POA: Diagnosis not present

## 2017-05-30 DIAGNOSIS — E1149 Type 2 diabetes mellitus with other diabetic neurological complication: Secondary | ICD-10-CM | POA: Insufficient documentation

## 2017-05-30 DIAGNOSIS — I63411 Cerebral infarction due to embolism of right middle cerebral artery: Secondary | ICD-10-CM | POA: Diagnosis not present

## 2017-05-30 DIAGNOSIS — G44309 Post-traumatic headache, unspecified, not intractable: Secondary | ICD-10-CM

## 2017-05-30 NOTE — Progress Notes (Signed)
Provider:  Veleta Miners Location:   [place] Room Number: 139/W Place of Service:  SNF (31)  PCP: Patient, No Pcp Per Patient Care Team: Patient, No Pcp Per as PCP - General (General Practice)  Extended Emergency Contact Information Primary Emergency Contact: Theresia Bough [place] Mobile Phone: [phone] Relation: Sister Secondary Emergency Contact: Coleman,Dorris  [place] Mobile Phone: [phone] Relation: Aunt  Code Status: Full Code Goals of Care: Advanced Directive information Advanced Directives 05/30/2017  Does Patient Have a Medical Advance Directive? Yes  Type of Advance Directive (No Data)  Does patient want to make changes to medical advance directive? No - Patient declined  Would patient like information on creating a medical advance directive? No - Patient declined      Chief Complaint  Patient presents with  . New Admit To SNF    New Admission Visit    HPI: Patient is a 56 y.o. male seen today for admission to SNF for therapy.  Patient has been in the Hospital from 08/23-10/22.  He was initially admitted as he was found unresponsive near the Bus stop . In the hospital he was found to Have BS of over 1100 and he was in DKA Shock.Marland Kitchen He was intubated and was on Ventilator  And , Pressors. While in ICU he sustained Acute CVA and was transferred to [facility] from [facility]. CT Scan showed Large Right MCA   infarct with Diffuse Edema and Mass effect. Underwent Decompressive right frontotemporoparietal craniectomy for decompression with duraplasty on 08/25. Patient also Developed PAF requiring Amiodarone which was stopped later. He was started on Eliquis as this was thought to be reason for his stroke. He also Developed Anemia requiring Repeated Blood transfusions.Thought to be due to Surgery. He required tracheostomy from 04/12/17- 10/17 Also Had PEG tube Feeds from 09/06. He was discharged on  Dysphagia diet  And Tube feeds at night. Also had Pneumonia and UTI  and was treated With Antibiotics. He also had Perianal Cellulitis with Small Abscess treated with Clindamycin. His repeat CT scan done on 10/01 showed swelling in the region of the complete right MCA territory infarction with more extensive bulging of the brain through the craniectomy defect. No midline shift however  Patient is now in SNF for therapy. He said he did not receive any therapy in the hospital. His only complain was Headache at site of surgery. He looks in good spirit . He says he was living with his Girlfriend in the apartment before the Stroke. 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 Does not seem like he was taking any of his Medications.   Past Medical History:  Diagnosis Date  . DM (diabetes mellitus) ([place])   . Hypertension    Past Surgical History:  Procedure Laterality Date  . CRANIECTOMY Right 04/01/2017   Procedure: RIGHT DECOMPRESSIVE CRANIECTOMY;  Surgeon: Ditty, Kevan Ny, MD;  Location: [place];  Service: Neurosurgery;  Laterality: Right;  . ESOPHAGOGASTRODUODENOSCOPY N/A 04/13/2017   Procedure: ESOPHAGOGASTRODUODENOSCOPY (EGD);  Surgeon: Georganna Skeans, MD;  Location: [place];  Service: General;  Laterality: N/A;  bedside  . PEG PLACEMENT N/A 04/13/2017   Procedure: PERCUTANEOUS ENDOSCOPIC GASTROSTOMY (PEG) PLACEMENT;  Surgeon: Georganna Skeans, MD;  Location: [place];  Service: General;  Laterality: N/A;    reports that he has quit smoking. His smoking use included Cigarettes. He has never used smokeless tobacco. He reports that he does not drink alcohol or use drugs. Social History   Social  History  . Marital status: Unknown    Spouse name: N/A  . Number of children: N/A  . Years of education: N/A   Occupational History  . Not on file.   Social History Main Topics  . Smoking status: Former Smoker    Types: Cigarettes  . Smokeless tobacco: Never Used     Comment: UTA  .  Alcohol use No     Comment: UTA  . Drug use: No     Comment: UTA  . Sexual activity: Not Currently    Birth control/ protection: None   Other Topics Concern  . Not on file   Social History Narrative  . No narrative on file    Functional Status Survey:    History reviewed. No pertinent family history.  There are no preventive care reminders to display for this patient.  No Known Allergies  Outpatient Encounter Prescriptions as of 05/30/2017  Medication Sig  . acetaminophen (TYLENOL) 325 MG tablet Place 650 mg into feeding tube every 4 (four) hours as needed.  . Amino Acids-Protein Hydrolys (FEEDING SUPPLEMENT, PRO-STAT SUGAR FREE 64,) LIQD Place 30 mLs into feeding tube 2 (two) times daily.  Marland Kitchen apixaban (ELIQUIS) 5 MG TABS tablet Place 1 tablet (5 mg total) into feeding tube 2 (two) times daily.  . benzonatate (TESSALON) 200 MG capsule Take 1 capsule (200 mg total) by mouth 3 (three) times daily.  . bisacodyl (DULCOLAX) 10 MG suppository Place 1 suppository (10 mg total) rectally daily as needed for moderate constipation.  . chlorhexidine (PERIDEX) 0.12 % solution 15 mLs by Mouth Rinse route 2 (two) times daily.  Marland Kitchen docusate (COLACE) 50 MG/5ML liquid Place 10 mLs (100 mg total) into feeding tube 2 (two) times daily as needed for mild constipation.  . feeding supplement, ENSURE ENLIVE, (ENSURE ENLIVE) LIQD Take 237 mLs by mouth 2 (two) times daily between meals.  . gabapentin (NEURONTIN) 300 MG capsule Take 1 capsule (300 mg total) by mouth at bedtime.  Marland Kitchen glycopyrrolate (ROBINUL) 1 MG tablet Place 1 tablet (1 mg total) into feeding tube 2 (two) times daily.  Marland Kitchen guaiFENesin-codeine 100-10 MG/5ML syrup Take 5 mLs by mouth every 4 (four) hours as needed for cough.  . insulin aspart (NOVOLOG) 100 UNIT/ML injection Inject 10 Units into the skin 3 (three) times daily before meals.  . insulin detemir (LEVEMIR) 100 UNIT/ML injection Inject 0.34 mLs (34 Units total) into the skin daily.  .  metoprolol tartrate (LOPRESSOR) 25 MG tablet Place 0.5 tablets (12.5 mg total) into feeding tube 2 (two) times daily.  . Nutritional Supplements (FEEDING SUPPLEMENT, GLUCERNA 1.2 CAL,) LIQD Place 1,000 mLs into feeding tube daily.  . pantoprazole sodium (PROTONIX) 40 mg/20 mL PACK Place 20 mLs (40 mg total) into feeding tube daily.  . Water For Irrigation, Sterile (FREE WATER) SOLN Place 250 mLs into feeding tube every 8 (eight) hours.  . [DISCONTINUED] ibuprofen (ADVIL,MOTRIN) 400 MG tablet Take 1 tablet (400 mg total) by mouth every 6 (six) hours as needed (breakthrough for headache).   No facility-administered encounter medications on file as of 05/30/2017.      Review of Systems  Constitutional: Negative.   HENT: Negative.   Respiratory: Negative.   Cardiovascular: Negative.   Genitourinary: Negative.   Musculoskeletal: Negative.   Skin: Negative.   Neurological: Positive for headaches.  Psychiatric/Behavioral: Negative.        Vitals:   05/30/17 1156  BP: 140/90  Pulse: 68  Resp: 20  Temp: 98.4 F (36.9  C)  TempSrc: Oral   There is no height or weight on file to calculate BMI. Physical Exam  Constitutional: He appears well-developed and well-nourished.  HENT:  Head: Normocephalic.  Mouth/Throat: Oropharynx is clear and moist.  Boggy Swelling on Right side of head where surgery was done. Incision looks Well healed.  Eyes: Pupils are equal, round, and reactive to light.  Neck: Neck supple.  Cardiovascular: Normal rate and normal heart sounds.   No murmur heard. Pulmonary/Chest: Effort normal and breath sounds normal. No respiratory distress. He has no wheezes. He has no rales.  Abdominal: Soft. Bowel sounds are normal. He exhibits no distension. There is no tenderness. There is no rebound.  Peg Tube  Musculoskeletal: He exhibits no edema.  Neurological: He is alert.  Has severe Left Hemiparesis with 0/5. Good strength in RUE and LE  Skin: Skin is warm and dry.    Psychiatric: He has a normal mood and affect. His behavior is normal. Thought content normal.    Labs reviewed: Basic Metabolic Panel:  Recent Labs  04/09/17 0407  04/11/17 0219  04/12/17 0151  05/21/17 0610 05/22/17 0459 05/26/17 0526  NA 148*  < > 143  < > 139  < > 136 135 137  K 4.1  < > 3.9  --  3.8  < > 4.3 4.3 4.3  CL 115*  < > 111  --  106  < > 99* 100* 98*  CO2 28  < > 27  --  26  < > 30 26 30   GLUCOSE 251*  < > 209*  --  147*  < > 124* 138* 122*  BUN 18  < > 12  --  15  < > 31* 29* 28*  CREATININE 0.86  < > 0.82  --  0.75  < > 0.77 0.71 0.80  CALCIUM 8.3*  < > 8.7*  --  8.5*  < > 10.8* 10.3 10.5*  MG 2.4  --  2.1  --  2.2  --   --   --   --   PHOS 3.7  --  3.6  --  4.4  --   --   --   --   < > = values in this interval not displayed. Liver Function Tests:  Recent Labs  04/25/17 0337 04/26/17 0340 05/02/17 0527  AST 20 20 19   ALT 24 24 20   ALKPHOS 66 73 60  BILITOT 0.3 0.2* 0.2*  PROT 8.1 8.3* 7.2  ALBUMIN 2.3* 2.5* 2.5*    Recent Labs  03/30/17 1808 05/02/17 0527  LIPASE 181* 33   No results for input(s): AMMONIA in the last 8760 hours. CBC:  Recent Labs  04/24/17 0424 04/25/17 0337 04/26/17 0340  05/21/17 0610 05/22/17 0459 05/26/17 0526  WBC 15.9* 16.0* 16.0*  < > 11.6* 11.6* 13.1*  NEUTROABS 9.9* 10.3* 10.7*  --   --   --   --   HGB 9.5* 9.5* 10.7*  < > 13.5 12.7* 13.0  HCT 31.8* 31.9* 35.3*  < > 45.8 41.3 43.3  MCV 94.4 94.4 95.1  < > 92.9 92.6 92.5  PLT 607* 594* 560*  < > 215 228 213  < > = values in this interval not displayed. Cardiac Enzymes:  Recent Labs  04/04/17 0148 04/04/17 0932 04/04/17 1500  TROPONINI 0.04* 0.03* 0.04*   BNP: Invalid input(s): POCBNP Lab Results  Component Value Date   HGBA1C 14.3 (H) 04/02/2017   No results found for: TSH  Lab Results  Component Value Date   [id number] 573 04/04/2017   Lab Results  Component Value Date   FOLATE 14.4 04/04/2017   Lab Results  Component Value Date    IRON 42 (L) 04/04/2017   TIBC 116 (L) 04/04/2017   FERRITIN 2,186 (H) 04/04/2017    Imaging and Procedures obtained prior to SNF admission: No results found.  Assessment/Plan  Acute CVA  Patient is going to start therapy.. He has dense Stroke involving Left side.  His BP is controlled Not on Any Meds Follow up with Neurology and Neurosurgery. He is also on Eliquis per Neurology.  PAF (paroxysmal atrial fibrillation)  On Eliquis. Rate controlled and in Sinus rhythm Right now.   Type 1 diabetes mellitus with complications   On Levemir A1C was 14.3 in the hospital. Accu Check  Also on Novolog with Meals   anemia Required blood transfusion in the hospital Hgb Stable will Monitur. Was FOBT in Hospital  Post-traumatic headache,  Just tylenol for Now. Dysphagia On Puree diet. D/W speech they are going to work with him to see if advance his diet. Disposition Would be hard for him to go independent living.  Family/ staff Communication:   Labs/tests ordered: CMP, CBC in 1 week  Total time spent in this patient care encounter was 45_ minutes; greater than 50% of the visit spent counseling patient, reviewing records , Labs and coordinating care for problems addressed at this encounter.

## 2017-05-31 NOTE — Telephone Encounter (Signed)
This encounter was created in error - please disregard.

## 2017-06-06 ENCOUNTER — Encounter (HOSPITAL_COMMUNITY)
Admission: RE | Admit: 2017-06-06 | Discharge: 2017-06-06 | Disposition: A | Discharge status: Home / Self Care | Payer: Medicaid Other | Source: Skilled Nursing Facility | Attending: Internal Medicine | Admitting: Internal Medicine

## 2017-06-06 DIAGNOSIS — E1165 Type 2 diabetes mellitus with hyperglycemia: Secondary | ICD-10-CM | POA: Insufficient documentation

## 2017-06-06 DIAGNOSIS — I63311 Cerebral infarction due to thrombosis of right middle cerebral artery: Secondary | ICD-10-CM | POA: Insufficient documentation

## 2017-06-06 DIAGNOSIS — Z5189 Encounter for other specified aftercare: Secondary | ICD-10-CM | POA: Insufficient documentation

## 2017-06-06 DIAGNOSIS — D6489 Other specified anemias: Secondary | ICD-10-CM | POA: Insufficient documentation

## 2017-06-06 LAB — CBC
HCT: 45.6 % (ref 39.0–52.0)
HEMOGLOBIN: 13.9 g/dL (ref 13.0–17.0)
MCH: 28 pg (ref 26.0–34.0)
MCHC: 30.5 g/dL (ref 30.0–36.0)
MCV: 91.8 fL (ref 78.0–100.0)
PLATELETS: 176 10*3/uL (ref 150–400)
RBC: 4.97 MIL/uL (ref 4.22–5.81)
RDW: 15.7 % — AB (ref 11.5–15.5)
WBC: 12.3 10*3/uL — ABNORMAL HIGH (ref 4.0–10.5)

## 2017-06-06 LAB — COMPREHENSIVE METABOLIC PANEL
ALBUMIN: 3.2 g/dL — AB (ref 3.5–5.0)
ALT: 32 U/L (ref 17–63)
ANION GAP: 6 (ref 5–15)
AST: 15 U/L (ref 15–41)
Alkaline Phosphatase: 62 U/L (ref 38–126)
BUN: 23 mg/dL — ABNORMAL HIGH (ref 6–20)
CO2: 28 mmol/L (ref 22–32)
Calcium: 10.7 mg/dL — ABNORMAL HIGH (ref 8.9–10.3)
Chloride: 100 mmol/L — ABNORMAL LOW (ref 101–111)
Creatinine, Ser: 0.63 mg/dL (ref 0.61–1.24)
GFR calc Af Amer: >60 mL/min (ref >60)
GFR calc non Af Amer: >60 mL/min (ref >60)
GLUCOSE: 121 mg/dL — AB (ref 65–99)
POTASSIUM: 4.2 mmol/L (ref 3.5–5.1)
SODIUM: 134 mmol/L — AB (ref 135–145)
Total Bilirubin: 0.4 mg/dL (ref 0.3–1.2)
Total Protein: 7 g/dL (ref 6.5–8.1)

## 2017-06-13 ENCOUNTER — Encounter (HOSPITAL_COMMUNITY)
Admission: RE | Admit: 2017-06-13 | Discharge: 2017-06-13 | Disposition: A | Discharge status: Home / Self Care | Payer: Medicaid Other | Source: Skilled Nursing Facility | Attending: Internal Medicine | Admitting: Internal Medicine

## 2017-06-13 DIAGNOSIS — I63311 Cerebral infarction due to thrombosis of right middle cerebral artery: Secondary | ICD-10-CM | POA: Insufficient documentation

## 2017-06-13 DIAGNOSIS — E1165 Type 2 diabetes mellitus with hyperglycemia: Secondary | ICD-10-CM | POA: Insufficient documentation

## 2017-06-13 DIAGNOSIS — Z5189 Encounter for other specified aftercare: Secondary | ICD-10-CM | POA: Insufficient documentation

## 2017-06-13 DIAGNOSIS — D6489 Other specified anemias: Secondary | ICD-10-CM | POA: Insufficient documentation

## 2017-06-13 LAB — CBC WITH DIFFERENTIAL/PLATELET
Basophils Absolute: 0.1 10*3/uL (ref 0.0–0.1)
Basophils Relative: 1 %
EOS ABS: 0.3 10*3/uL (ref 0.0–0.7)
EOS PCT: 2 %
HCT: 50 % (ref 39.0–52.0)
HEMOGLOBIN: 15.3 g/dL (ref 13.0–17.0)
LYMPHS ABS: 2.9 10*3/uL (ref 0.7–4.0)
Lymphocytes Relative: 26 %
MCH: 27.9 pg (ref 26.0–34.0)
MCHC: 30.6 g/dL (ref 30.0–36.0)
MCV: 91.1 fL (ref 78.0–100.0)
MONOS PCT: 8 %
Monocytes Absolute: 0.9 10*3/uL (ref 0.1–1.0)
Neutro Abs: 6.9 10*3/uL (ref 1.7–7.7)
Neutrophils Relative %: 62 %
Platelets: 200 10*3/uL (ref 150–400)
RBC: 5.49 MIL/uL (ref 4.22–5.81)
RDW: 15.5 % (ref 11.5–15.5)
WBC: 11.1 10*3/uL — ABNORMAL HIGH (ref 4.0–10.5)

## 2017-06-13 LAB — COMPREHENSIVE METABOLIC PANEL
ALK PHOS: 64 U/L (ref 38–126)
ALT: 47 U/L (ref 17–63)
ANION GAP: 10 (ref 5–15)
AST: 20 U/L (ref 15–41)
Albumin: 3.7 g/dL (ref 3.5–5.0)
BUN: 22 mg/dL — ABNORMAL HIGH (ref 6–20)
CALCIUM: 11.4 mg/dL — AB (ref 8.9–10.3)
CO2: 28 mmol/L (ref 22–32)
Chloride: 102 mmol/L (ref 101–111)
Creatinine, Ser: 0.77 mg/dL (ref 0.61–1.24)
GFR calc non Af Amer: >60 mL/min (ref >60)
Glucose, Bld: 129 mg/dL — ABNORMAL HIGH (ref 65–99)
Potassium: 4.1 mmol/L (ref 3.5–5.1)
SODIUM: 140 mmol/L (ref 135–145)
TOTAL PROTEIN: 7.9 g/dL (ref 6.5–8.1)
Total Bilirubin: 0.5 mg/dL (ref 0.3–1.2)

## 2017-06-16 ENCOUNTER — Encounter: Payer: Self-pay | Admitting: Internal Medicine

## 2017-06-16 ENCOUNTER — Non-Acute Institutional Stay (SKILLED_NURSING_FACILITY): Payer: Medicaid Other | Admitting: Internal Medicine

## 2017-06-16 DIAGNOSIS — I480 Paroxysmal atrial fibrillation: Secondary | ICD-10-CM

## 2017-06-16 DIAGNOSIS — I63411 Cerebral infarction due to embolism of right middle cerebral artery: Secondary | ICD-10-CM | POA: Diagnosis not present

## 2017-06-16 DIAGNOSIS — R52 Pain, unspecified: Secondary | ICD-10-CM | POA: Diagnosis not present

## 2017-06-16 DIAGNOSIS — E108 Type 1 diabetes mellitus with unspecified complications: Secondary | ICD-10-CM | POA: Diagnosis not present

## 2017-06-16 NOTE — Progress Notes (Signed)
Location:   [place] Room Number: 831D Place of Service:  SNF (31) Provider:  [place] Lewis  Patient, No Pcp Per  Patient Care Team: Patient, No Pcp Per as PCP - General (General Practice)  Extended Emergency Contact Information Primary Emergency Contact: Theresia Bough [place] Mobile Phone: [phone] Relation: Sister Secondary Emergency Contact: Coleman,Dorris  [place] Mobile Phone: [phone] Relation: Aunt  Code Status:  Full Code Goals of care: Advanced Directive information Advanced Directives 06/16/2017  Does Patient Have a Medical Advance Directive? Yes  Type of Advance Directive (No Data)  Does patient want to make changes to medical advance directive? No - Patient declined  Would patient like information on creating a medical advance directive? No - Patient declined     Chief Complaint  Patient presents with  . Acute Visit    Patient c/o Pain Management    HPI:  Pt is a 56 y.o. male seen today for an acute visit for pain management  \Patient is here for rehabilitation after sustainingan acute CVA large right MCA infarct with diffuse edema and mass effect required a craniotomy for decompression with dural plasty.  He initially was admitted to the hospital in diabetic ketoacidosis with a blood sugar  Over 1100---.   Hospital he also developed atrial fibrillation originally was on a amiodarone now is on Lopressor for rate control he is on Eliquis for anticoagulation.  He also developed anemia which required transfusions hemoglobin appears to be stabilized now at 15.3.  He also at one time required a tracheostomy also had a PEG tube insertion is been discharged on dysphagia diet and tube feedings at night.  He also was treatfor pneumonia and UTI as well as perianal cellulitis.  He has been discharged here for therapy-apparently progress has been somewhat slow per discussion with therapist  today-e is also complaining of somewhat generalized pain he does have some chronic  Headaches since the craniotomy-he is on Tylenol for pain but he says this is not totally effective     Past Medical History:  Diagnosis Date  . DM (diabetes mellitus) ([place])   . Hypertension    History reviewed. No pertinent surgical history.  No Known Allergies  Outpatient Encounter Medications as of 06/16/2017  Medication Sig  . acetaminophen (TYLENOL) 325 MG tablet Place 650 mg into feeding tube every 4 (four) hours as needed.  . Amino Acids-Protein Hydrolys (FEEDING SUPPLEMENT, PRO-STAT SUGAR FREE 64,) LIQD Place 30 mLs into feeding tube 2 (two) times daily.  Marland Kitchen apixaban (ELIQUIS) 5 MG TABS tablet Place 1 tablet (5 mg total) into feeding tube 2 (two) times daily.  . bisacodyl (DULCOLAX) 10 MG suppository Place 1 suppository (10 mg total) rectally daily as needed for moderate constipation.  . chlorhexidine (PERIDEX) 0.12 % solution 15 mLs by Mouth Rinse route 2 (two) times daily.  Marland Kitchen docusate (COLACE) 50 MG/5ML liquid Place 10 mLs (100 mg total) into feeding tube 2 (two) times daily as needed for mild constipation.  . feeding supplement, ENSURE ENLIVE, (ENSURE ENLIVE) LIQD Take 237 mLs by mouth 2 (two) times daily between meals.  . gabapentin (NEURONTIN) 300 MG capsule Take 1 capsule (300 mg total) by mouth at bedtime.  Marland Kitchen glycopyrrolate (ROBINUL) 1 MG tablet Place 1 tablet (1 mg total) into feeding tube 2 (two) times daily.  Marland Kitchen guaiFENesin-codeine 100-10 MG/5ML syrup Take 5 mLs by mouth every 4 (four) hours as needed for cough.  . insulin aspart (NOVOLOG) 100  UNIT/ML injection Inject 10 Units into the skin 3 (three) times daily before meals.  . insulin detemir (LEVEMIR) 100 UNIT/ML injection Inject 0.34 mLs (34 Units total) into the skin daily.  . metoprolol tartrate (LOPRESSOR) 25 MG tablet Place 0.5 tablets (12.5 mg total) into feeding tube 2 (two) times daily.  . Nutritional Supplements (FEEDING  SUPPLEMENT, GLUCERNA 1.2 CAL,) LIQD Place 1,000 mLs into feeding tube daily.  . pantoprazole sodium (PROTONIX) 40 mg/20 mL PACK Place 20 mLs (40 mg total) into feeding tube daily.  . Water For Irrigation, Sterile (FREE WATER) SOLN Place 250 mLs into feeding tube every 8 (eight) hours.  . [DISCONTINUED] benzonatate (TESSALON) 200 MG capsule Take 1 capsule (200 mg total) by mouth 3 (three) times daily.   No facility-administered encounter medications on file as of 06/16/2017.     Review of Systems   He is not complaining of fever or chills.  Skin does not complain of rashes or itching.  Head ears eyes nose mouth and throat is not complaining of acute visual changes does have dysphagia but does not complaining of sore throat.  Respiratory does not complain of shortness breath or cough.  Cardiac does not complain of chest pain or edema.  GI does not complan of abdominal pain but says he has a poor appetite have not noted complaints of vomiting or nausea diarrhea or constipation  \GU does not compla of dysuria.  Muscle skeletal does complain of somewhat diffuse pain more so it appears his left side which is his weak side-also complains of intermittent headaches.  Neurologic again does complain at times of headaches is status post CVA with left-sided hemiparasis.  Psych does not complain of anxiety or depression-per therapy at times appears to be somewhat frustrated  There is no immunization history on file for this patient. Pertinent  Health Maintenance Due  Topic Date Due  . FOOT EXAM  04/06/1971  . OPHTHALMOLOGY EXAM  04/06/1971  . URINE MICROALBUMIN  04/06/1971  . COLONOSCOPY  04/06/2011  . INFLUENZA VACCINE  03/08/2017  . HEMOGLOBIN A1C  10/03/2017   No flowsheet data found. Functional Status Survey:    Vitals:   06/16/17 1208  BP: 118/90  Pulse: 98  he is afebrile respirations are 17 weight is 177 pounds  Physical Exam   In general this is a pleasant fairly  well-developed middle-aged male in no distress.  His skin is warm and dry he does have some continued bulky swelling of right side of his head status post surgery-incision looks benign without sign of infection.  Eyes pupils  Appear reactive to light sclerae and conjunctivae are clear  appear. To have possibly right-sided deficits   oropharynx -- did not open his mouth very wide. From what I could see mucous membranes are moist  Chest is clear to auscultation there is no labored breathing.  Heart is regular rate and rhythm without murmur gallop around he does not have significant lower extremity edema.  Abdomen is soft nontender with positive bowel sounds PEG site appears unremarkable.  Muscle skeletal continues with severe left-sided hemiparalysis is able to moveright upper and lower extremities with baseline strength. There is some pain  With passive range of motion of his weak side extremities  He is alert  Psych--he is pleasant and appropriate with normal mood and effect    Labs reviewed: Recent Labs    04/09/17 0407  04/11/17 0219  04/12/17 0151  05/26/17 0526 06/06/17 0630 06/13/17 0700  NA 148*   < >  143   < > 139   < > 137 134* 140  K 4.1   < > 3.9  --  3.8   < > 4.3 4.2 4.1  CL 115*   < > 111  --  106   < > 98* 100* 102  CO2 28   < > 27  --  26   < > 30 28 28   GLUCOSE 251*   < > 209*  --  147*   < > 122* 121* 129*  BUN 18   < > 12  --  15   < > 28* 23* 22*  CREATININE 0.86   < > 0.82  --  0.75   < > 0.80 0.63 0.77  CALCIUM 8.3*   < > 8.7*  --  8.5*   < > 10.5* 10.7* 11.4*  MG 2.4  --  2.1  --  2.2  --   --   --   --   PHOS 3.7  --  3.6  --  4.4  --   --   --   --    < > = values in this interval not displayed.   Recent Labs    05/02/17 0527 06/06/17 0630 06/13/17 0700  AST 19 15 20   ALT 20 32 47  ALKPHOS 60 62 64  BILITOT 0.2* 0.4 0.5  PROT 7.2 7.0 7.9  ALBUMIN 2.5* 3.2* 3.7   Recent Labs    04/25/17 0337 04/26/17 0340  05/26/17 0526 06/06/17 0630  06/13/17 0700  WBC 16.0* 16.0*   < > 13.1* 12.3* 11.1*  NEUTROABS 10.3* 10.7*  --   --   --  6.9  HGB 9.5* 10.7*   < > 13.0 13.9 15.3  HCT 31.9* 35.3*   < > 43.3 45.6 50.0  MCV 94.4 95.1   < > 92.5 91.8 91.1  PLT 594* 560*   < > 213 176 200   < > = values in this interval not displayed.   No results found for: TSH Lab Results  Component Value Date   HGBA1C 14.3 (H) 04/02/2017   Lab Results  Component Value Date   CHOL 93 04/02/2017   HDL 28 (L) 04/02/2017   LDLCALC 38 04/02/2017   TRIG 135 04/02/2017   CHOLHDL 3.3 04/02/2017    Significant Diagnostic Results in last 30 days:  Dg Chest Port 1 View  Result Date: 05/26/2017 CLINICAL DATA:  Cough EXAM: PORTABLE CHEST 1 VIEW COMPARISON:  05/13/2017 FINDINGS: Tracheostomy tube has been removed in the interval. Cardiac shadow is within normal limits. Lungs are well aerated bilaterally. No focal infiltrate or sizable effusion is seen. Mild degenerative changes of the thoracic spine are noted. IMPRESSION: No active disease. Electronically Signed   By: Inez Catalina M.D.   On: 05/26/2017 09:11    Assessment/Plan  #1 pain management--at this point only has Tylenol will add tramadol 50 mg every 6 hours when necessary to see if this will help especially before therapy and monitor  #2 history of CVA continues with therapy apparently this is still somewhat of a challenge hopefully stronger pain management may help-he has follow-up with  Neurology and neurosurgery he is on Eliquis per neurology  #3 atrial fibrillation at this point appears to be rate controlledpulse was in the 90's but he had  just  Finished therapy this pointwill monitor--on Eliquis foranticoagulation  #4 type 1 diabetes continues on Levemir and NovoLog sliding scale--hemoglobin A1c was 14.3 in the  hospital-I suspect this reflects poor prior control CBGs here been quite stable in the 90s-mid 100s.  #5 anemia this has improved significantly with a hemoglobin of 15.3 on lab  done 06/13/2017.  36-history of posttraumatic headache again will increase his pain medication up to tramadol when necessary as noted above and monitor.  #7 history of dysphagia continues on a dysphagia diet and PEG feedings   [id number]

## 2017-06-28 ENCOUNTER — Non-Acute Institutional Stay (SKILLED_NURSING_FACILITY): Payer: Medicaid Other | Admitting: Internal Medicine

## 2017-06-28 ENCOUNTER — Encounter: Payer: Self-pay | Admitting: Internal Medicine

## 2017-06-28 DIAGNOSIS — E108 Type 1 diabetes mellitus with unspecified complications: Secondary | ICD-10-CM

## 2017-06-28 DIAGNOSIS — I480 Paroxysmal atrial fibrillation: Secondary | ICD-10-CM | POA: Diagnosis not present

## 2017-06-28 DIAGNOSIS — W19XXXD Unspecified fall, subsequent encounter: Secondary | ICD-10-CM | POA: Diagnosis not present

## 2017-06-28 DIAGNOSIS — R52 Pain, unspecified: Secondary | ICD-10-CM | POA: Diagnosis not present

## 2017-06-28 NOTE — Progress Notes (Signed)
Location:   [place] Room Number: 139/W Place of Service:  SNF (31) Provider:  [place] Lewis  Patient, No Pcp Per  Patient Care Team: Patient, No Pcp Per as PCP - General (General Practice)  Extended Emergency Contact Information Primary Emergency Contact: Theresia Bough [place] Mobile Phone: [phone] Relation: Sister Secondary Emergency Contact: Coleman,Dorris  [place] Mobile Phone: [phone] Relation: Aunt  Code Status:  Full Code Goals of care: Advanced Directive information Advanced Directives 06/28/2017  Does Patient Have a Medical Advance Directive? Yes  Type of Advance Directive (No Data)  Does patient want to make changes to medical advance directive? No - Patient declined  Would patient like information on creating a medical advance directive? No - Patient declined     Chief Complaint  Patient presents with  . Acute Visit    F/U from Fall    HPI:  Pt is a 56 y.o. male seen today for an acute visit for  follow-up of a fall yesterday.  Patient apparently was trying to get into bed without any assistance and fell -- in his head- but did not sustain any visible injuries in does not complain of any increased pain-he does have a history of somewhat chronic headaches which he said are unchanged he has experienced these apparently since his recent CVA.  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  He was initially admitted to the hospital in diabetic ketoacidosis with a blood sugar of over thousand.  He also developed atrial fibrillation in the hospital he is now on Lopressor for rate control and this appears to be effective he is on Eliquis for anticoagulation.  He continues to have a PEG tube with a history of dysphasia he is taking some food and nourishment p.o. however.  He also was treated  for pneumonia during hospitalization and at one time required a tracheostomy trach has since been removed  So recently for complaints of generalized pain including the chronic headaches-he has been started on as needed tramadol he says this is helping.  Currently vital signs are stable he has no acute complaints other than chronic intermittent headache.  He says he would really like to get up to live close to [place]  the [place] area      Past Medical History:  Diagnosis Date  . DM (diabetes mellitus) ([place])   . Hypertension    Past Surgical History:  Procedure Laterality Date  . CRANIECTOMY Right 04/01/2017   Procedure: RIGHT DECOMPRESSIVE CRANIECTOMY;  Surgeon: Ditty, Kevan Ny, MD;  Location: [facility];  Service: Neurosurgery;  Laterality: Right;  . ESOPHAGOGASTRODUODENOSCOPY N/A 04/13/2017   Procedure: ESOPHAGOGASTRODUODENOSCOPY (EGD);  Surgeon: Georganna Skeans, MD;  Location: [facility];  Service: General;  Laterality: N/A;  bedside  . PEG PLACEMENT N/A 04/13/2017   Procedure: PERCUTANEOUS ENDOSCOPIC GASTROSTOMY (PEG) PLACEMENT;  Surgeon: Georganna Skeans, MD;  Location: [facility] ENDOSCOPY;  Service: General;  Laterality: N/A;    No Known Allergies  Outpatient Encounter Medications as of 06/28/2017  Medication Sig  . acetaminophen (TYLENOL) 325 MG tablet Place 650 mg into feeding tube every 4 (four) hours as needed.  . Amino Acids-Protein Hydrolys (FEEDING SUPPLEMENT, PRO-STAT SUGAR FREE 64,) LIQD Place 30 mLs into feeding tube 2 (two) times daily.  Marland Kitchen apixaban (ELIQUIS) 5 MG TABS tablet Place 1 tablet (5 mg total) into feeding tube 2 (two) times  daily.  . bisacodyl (DULCOLAX) 10 MG suppository Place 1 suppository (10 mg total) rectally daily as needed for moderate constipation.  . chlorhexidine (PERIDEX) 0.12 % solution 15 mLs by Mouth Rinse route 2 (two) times daily.  Marland Kitchen docusate (COLACE) 50 MG/5ML liquid Place 10 mLs (100 mg total) into feeding tube 2 (two) times daily as  needed for mild constipation.  . feeding supplement, ENSURE ENLIVE, (ENSURE ENLIVE) LIQD Take 237 mLs by mouth 2 (two) times daily between meals.  . gabapentin (NEURONTIN) 300 MG capsule Take 1 capsule (300 mg total) by mouth at bedtime.  Marland Kitchen glycopyrrolate (ROBINUL) 1 MG tablet Place 1 tablet (1 mg total) into feeding tube 2 (two) times daily.  Marland Kitchen guaiFENesin-codeine 100-10 MG/5ML syrup Take 5 mLs by mouth every 4 (four) hours as needed for cough.  . insulin aspart (NOVOLOG) 100 UNIT/ML injection Inject 10 Units into the skin 3 (three) times daily before meals.  . insulin detemir (LEVEMIR) 100 UNIT/ML injection Inject 0.34 mLs (34 Units total) into the skin daily.  . metoprolol tartrate (LOPRESSOR) 25 MG tablet Place 0.5 tablets (12.5 mg total) into feeding tube 2 (two) times daily.  . Nutritional Supplements (FEEDING SUPPLEMENT, GLUCERNA 1.2 CAL,) LIQD Place 1,000 mLs into feeding tube daily.  . pantoprazole sodium (PROTONIX) 40 mg/20 mL PACK Place 20 mLs (40 mg total) into feeding tube daily.  . traMADol (ULTRAM) 50 MG tablet Take 50 mg by mouth every 6 (six) hours as needed.  . Water For Irrigation, Sterile (FREE WATER) SOLN Place 250 mLs into feeding tube every 8 (eight) hours.   No facility-administered encounter medications on file as of 06/28/2017.     Review of Systems   Is in general is not complaining of any fever or chills Nutrition p.o.  .  Skin does not complain of rashes itching or increased bruising.  Head ears eyes nose mouth and throat complains of somewhat chronic intermittent headaches-he does have a history of dysphasia is status post PEG tube tolerating some food PO  Respiratory does not complain of shortness of breath or cough.  Cardiac denies chest pain does not have significant lower extremity edema.  GI is not complaining of abdominal discomfort is status post PEG tube says his appetite is not great is not real fond of the food-does not complain of constipation  diarrhea nausea or vomiting.  Musculoskeletal does not really complain of joint pain today-does have some left-sided discomfort which is chronic he says the tramadol is helping.  Neurologic does not complain of dizziness or syncope continues to complain of chronic intermittent headaches status post CVA continues with left-sided hemiparalysis.  Psych does not complain of anxiety or depression says he will he would like to get to be closer to his sister in the [place] area.     There is no immunization history on file for this patient. Pertinent  Health Maintenance Due  Topic Date Due  . FOOT EXAM  04[birth year]06/1971  . OPHTHALMOLOGY EXAM  04[birth year]06/1971  . URINE MICROALBUMIN  04[birth year]06/1971  . COLONOSCOPY  04/06/2011  . INFLUENZA VACCINE  03/08/2017  . HEMOGLOBIN A1C  10/03/2017   No flowsheet data found. Functional Status Survey:    Vitals:   06/28/17 1526  BP: 114/68  Pulse: 96  Resp: 18  Temp: 98.1 F (36.7 C)  TempSrc: Oral    Physical Exam   In general this is a pleasant middle-aged male in no distress resting comfortably in bed.  His skin is warm and  dry does have some swelling of the right side of his head status post craniotomy-this appears unchanged from previous exam.  Eyes pupils appear reactive to light sclera and conjunctive are clear he does have a history of some right-sided deficits visual acuity appears to be at baseline.  Oropharynx is clear mucous membranes moist.    Chest is clear to auscultation there is no labored breathing.  Heart is regular rate and rhythm without murmur gallop or rub he does not have significant lower extremity edema.  Abdomen is soft nontender with active bowel sounds PEG site appears unremarkable without drainage bleeding or sign of infection.  Musculoskeletal has severe left-sided hemiparesis which is baseline is able to move right upper and lower extremities.at baseline  Neurologic  Speech is clear continues with left-sided  deficits he is alert is  Psych he is alert and oriented pleasant and appropriate   Labs reviewed: Recent Labs    04/09/17 0407  04/11/17 0219  04/12/17 0151  05/26/17 0526 06/06/17 0630 06/13/17 0700  NA 148*   < > 143   < > 139   < > 137 134* 140  K 4.1   < > 3.9  --  3.8   < > 4.3 4.2 4.1  CL 115*   < > 111  --  106   < > 98* 100* 102  CO2 28   < > 27  --  26   < > 30 28 28   GLUCOSE 251*   < > 209*  --  147*   < > 122* 121* 129*  BUN 18   < > 12  --  15   < > 28* 23* 22*  CREATININE 0.86   < > 0.82  --  0.75   < > 0.80 0.63 0.77  CALCIUM 8.3*   < > 8.7*  --  8.5*   < > 10.5* 10.7* 11.4*  MG 2.4  --  2.1  --  2.2  --   --   --   --   PHOS 3.7  --  3.6  --  4.4  --   --   --   --    < > = values in this interval not displayed.   Recent Labs    05/02/17 0527 06/06/17 0630 06/13/17 0700  AST 19 15 20   ALT 20 32 47  ALKPHOS 60 62 64  BILITOT 0.2* 0.4 0.5  PROT 7.2 7.0 7.9  ALBUMIN 2.5* 3.2* 3.7   Recent Labs    04/25/17 0337 04/26/17 0340  05/26/17 0526 06/06/17 0630 06/13/17 0700  WBC 16.0* 16.0*   < > 13.1* 12.3* 11.1*  NEUTROABS 10.3* 10.7*  --   --   --  6.9  HGB 9.5* 10.7*   < > 13.0 13.9 15.3  HCT 31.9* 35.3*   < > 43.3 45.6 50.0  MCV 94.4 95.1   < > 92.5 91.8 91.1  PLT 594* 560*   < > 213 176 200   < > = values in this interval not displayed.   No results found for: TSH Lab Results  Component Value Date   HGBA1C 14.3 (H) 04/02/2017   Lab Results  Component Value Date   CHOL 93 04/02/2017   HDL 28 (L) 04/02/2017   LDLCALC 38 04/02/2017   TRIG 135 04/02/2017   CHOLHDL 3.3 04/02/2017    Significant Diagnostic Results in last 30 days:  No results found.  Assessment/Plan  #1 history of  fall-physical examhis baseline with previous exams he does have a history of headache but this is chronic and unchanged-at this point continue to monitor-- neurologically and  physically appears to be at baseline Little equal without #2 Pain management-per  patient tramadol is helping at this point will monitor.  Treatment   # 3---He is history of diabetes he is on Levemir 34 units as well as NovoLog with meals if blood sugar is greater than 150   Blood sugars appear to be quite stable largely in the  low mid 100s through all day parts -.  I do see occasional readings in the 60s 70s but these are not frequent    #4-atrial fibrillation this appears rate controlled on Lopressor he is on Eliquis for anticoagulation.  5.  Leukocytosis this appears to be almost normalized on most recent lab will update a CBC with differential he does not show signs of infection fever chills.  Also will update a BMP I do see calcium level was 11.4 on lab done this month-he is not on calcium supplementation-   [id number]

## 2017-06-29 ENCOUNTER — Encounter (HOSPITAL_COMMUNITY)
Admission: RE | Admit: 2017-06-29 | Discharge: 2017-06-29 | Disposition: A | Discharge status: Home / Self Care | Payer: Medicaid Other | Source: Skilled Nursing Facility | Attending: *Deleted | Admitting: *Deleted

## 2017-06-29 LAB — BASIC METABOLIC PANEL
ANION GAP: 6 (ref 5–15)
BUN: 23 mg/dL — ABNORMAL HIGH (ref 6–20)
CALCIUM: 11.6 mg/dL — AB (ref 8.9–10.3)
CO2: 29 mmol/L (ref 22–32)
Chloride: 101 mmol/L (ref 101–111)
Creatinine, Ser: 0.77 mg/dL (ref 0.61–1.24)
GFR calc Af Amer: >60 mL/min (ref >60)
GFR calc non Af Amer: >60 mL/min (ref >60)
GLUCOSE: 106 mg/dL — AB (ref 65–99)
Potassium: 4 mmol/L (ref 3.5–5.1)
Sodium: 136 mmol/L (ref 135–145)

## 2017-06-29 LAB — CBC WITH DIFFERENTIAL/PLATELET
Basophils Absolute: 0.1 10*3/uL (ref 0.0–0.1)
Basophils Relative: 1 %
Eosinophils Absolute: 0.4 10*3/uL (ref 0.0–0.7)
Eosinophils Relative: 4 %
HEMATOCRIT: 45.9 % (ref 39.0–52.0)
Hemoglobin: 13.6 g/dL (ref 13.0–17.0)
Lymphocytes Relative: 31 %
Lymphs Abs: 3.1 10*3/uL (ref 0.7–4.0)
MCH: 26.9 pg (ref 26.0–34.0)
MCHC: 29.6 g/dL — AB (ref 30.0–36.0)
MCV: 90.9 fL (ref 78.0–100.0)
MONO ABS: 0.7 10*3/uL (ref 0.1–1.0)
Monocytes Relative: 7 %
NEUTROS ABS: 5.8 10*3/uL (ref 1.7–7.7)
Neutrophils Relative %: 57 %
Platelets: 184 10*3/uL (ref 150–400)
RBC: 5.05 MIL/uL (ref 4.22–5.81)
RDW: 15.4 % (ref 11.5–15.5)
WBC: 10 10*3/uL (ref 4.0–10.5)

## 2017-07-03 ENCOUNTER — Other Ambulatory Visit: Payer: Self-pay

## 2017-07-03 MED ORDER — TRAMADOL HCL 50 MG PO TABS: 50.0000 mg | ORAL_TABLET | Freq: Four times a day (QID) | ORAL | 0 refills | Status: DC | PRN

## 2017-07-03 NOTE — Telephone Encounter (Signed)
RX Fax for [facility]@ [phone]  

## 2017-07-10 ENCOUNTER — Non-Acute Institutional Stay (SKILLED_NURSING_FACILITY): Payer: Medicaid Other | Admitting: Internal Medicine

## 2017-07-10 ENCOUNTER — Encounter: Payer: Self-pay | Admitting: Internal Medicine

## 2017-07-10 DIAGNOSIS — E108 Type 1 diabetes mellitus with unspecified complications: Secondary | ICD-10-CM | POA: Diagnosis not present

## 2017-07-10 DIAGNOSIS — I63411 Cerebral infarction due to embolism of right middle cerebral artery: Secondary | ICD-10-CM

## 2017-07-10 DIAGNOSIS — I10 Essential (primary) hypertension: Secondary | ICD-10-CM | POA: Diagnosis not present

## 2017-07-10 DIAGNOSIS — D62 Acute posthemorrhagic anemia: Secondary | ICD-10-CM | POA: Diagnosis not present

## 2017-07-10 DIAGNOSIS — I480 Paroxysmal atrial fibrillation: Secondary | ICD-10-CM | POA: Diagnosis not present

## 2017-07-10 NOTE — Progress Notes (Signed)
Location:   [place] Room Number: 139/W Place of Service:  SNF (31) Provider:  Ariella Voit,[place] Priestly  Patient, No Pcp Per  Patient Care Team: Patient, No Pcp Per as PCP - General (General Practice)  Extended Emergency Contact Information Primary Emergency Contact: Theresia Bough [place] Mobile Phone: [phone] Relation: Sister Secondary Emergency Contact: Coleman,Dorris  [place] Mobile Phone: [phone] Relation: Aunt  Code Status:  Full Code Goals of care: Advanced Directive information Advanced Directives 07/10/2017  Does Patient Have a Medical Advance Directive? Yes  Type of Advance Directive (No Data)  Does patient want to make changes to medical advance directive? No - Patient declined  Would patient like information on creating a medical advance directive? No - Patient declined   Chief complaint-acute visit follow-up chronic medical conditions include history of CVA with left-sided hemiparesis- diabetes-atrial fibrillation-constipation-anemia- HPI:  Pt is a 56 y.o. male seen today for medical management of chronic diseases.  As noted above.  He is here for rehab after sustaining an acute CVA while hospitalized.  Initially he was admitted to the hospital unresponsive-he was found to have a blood sugar of over 1100 and was in DKA.  He was intubated and on a ventilator as well as pressors- it was in the ICU he sustained in the CVA.  CT scan showed a large right MCA infarct with diffuse edema and mass-effect.  He underwent a decompressive right frontotemporal parietal craniotomy for decompression with duraplasty in August.  He also developed A. fib that required amiodarone which was later stopped he was started on Eliquis and is also on Lopressor this appears to be rate controlled.  He also had anemia that was thought secondary to his surgeries-this appears to have stabilized he did require transfusions in the  hospital hemoglobin was 13.6 on lab done on June 29, 2017.  He also had a tracheostomy which is since been removed he continues on PEG feedings at night-he is on a dysphagia diet.  Hospital he also was treated for pneumonia and UTI this responded to antibiotics he also had perianal cellulitis with a small abscess that was treated with clindamycin.  A CT scan done October 1 showed swelling in the region of the right MCA territory infarction with more extensive bulging of the brain through the craniotomy defect there was no midline shift however.  Neurologically he appears to be at baseline with left-sided hemiparalysis-he has complained of somewhat chronic intermittent headaches he says these are present intermittently and have been present during his hospitalization as well without really  a change in status  In regards to CVA he does continue with therapy still has a splint to his left arm with significant left-sided hemiparesis-he does continue on Eliquis with a history of the A. fib which is rate controlled with Lopressor.  He will be seeing neurology tomorrow for follow-up.  In regards to diabetes blood sugars appear to be largely stable in the mid 100s through all day partnership occasionally there are readings in the 80s and 90s but these are not consistent-will update a hemoglobin A1c.  Apparently had very poor control as an outpatient resulting again in apparent diabetic ketoacidosis.  He is currently on Levemir 34 units a day as well as NovoLog 10 units with meals if CBG is greater than 150.  Again today other than the intermittent headaches which he says are chronic he has no complaints he says his pain medication including tramadol does help.  Past Medical History:  Diagnosis Date  . DM (diabetes mellitus) ([place])   . Hypertension    Past Surgical History:  Procedure Laterality Date  . CRANIECTOMY Right 04/01/2017   Procedure: RIGHT DECOMPRESSIVE CRANIECTOMY;   Surgeon: Ditty, Kevan Ny, MD;  Location: [facility];  Service: Neurosurgery;  Laterality: Right;  . ESOPHAGOGASTRODUODENOSCOPY N/A 04/13/2017   Procedure: ESOPHAGOGASTRODUODENOSCOPY (EGD);  Surgeon: Georganna Skeans, MD;  Location: [facility];  Service: General;  Laterality: N/A;  bedside  . PEG PLACEMENT N/A 04/13/2017   Procedure: PERCUTANEOUS ENDOSCOPIC GASTROSTOMY (PEG) PLACEMENT;  Surgeon: Georganna Skeans, MD;  Location: [facility] ENDOSCOPY;  Service: General;  Laterality: N/A;    No Known Allergies  Outpatient Encounter Medications as of 07/10/2017  Medication Sig  . acetaminophen (TYLENOL) 325 MG tablet Place 650 mg into feeding tube every 4 (four) hours as needed.  . Amino Acids-Protein Hydrolys (FEEDING SUPPLEMENT, PRO-STAT SUGAR FREE 64,) LIQD Place 30 mLs into feeding tube 2 (two) times daily.  Marland Kitchen apixaban (ELIQUIS) 5 MG TABS tablet Place 1 tablet (5 mg total) into feeding tube 2 (two) times daily.  . bisacodyl (DULCOLAX) 10 MG suppository Place 1 suppository (10 mg total) rectally daily as needed for moderate constipation.  . chlorhexidine (PERIDEX) 0.12 % solution 15 mLs by Mouth Rinse route 2 (two) times daily.  Marland Kitchen docusate (COLACE) 50 MG/5ML liquid Place 10 mLs (100 mg total) into feeding tube 2 (two) times daily as needed for mild constipation.  . feeding supplement, ENSURE ENLIVE, (ENSURE ENLIVE) LIQD Take 237 mLs by mouth 2 (two) times daily between meals.  . gabapentin (NEURONTIN) 300 MG capsule Take 1 capsule (300 mg total) by mouth at bedtime.  Marland Kitchen glycopyrrolate (ROBINUL) 1 MG tablet Place 1 tablet (1 mg total) into feeding tube 2 (two) times daily.  Marland Kitchen guaiFENesin-codeine 100-10 MG/5ML syrup Take 5 mLs by mouth every 4 (four) hours as needed for cough.  . insulin aspart (NOVOLOG) 100 UNIT/ML injection Inject 10 Units into the skin 3 (three) times daily before meals.  . insulin detemir (LEVEMIR) 100 UNIT/ML injection Inject 0.34 mLs (34 Units total) into the skin daily.  . metoprolol  tartrate (LOPRESSOR) 25 MG tablet Place 0.5 tablets (12.5 mg total) into feeding tube 2 (two) times daily.  . Nutritional Supplements (FEEDING SUPPLEMENT, GLUCERNA 1.2 CAL,) LIQD Place 1,000 mLs into feeding tube daily.  . pantoprazole sodium (PROTONIX) 40 mg/20 mL PACK Place 20 mLs (40 mg total) into feeding tube daily.  . traMADol (ULTRAM) 50 MG tablet Take 1 tablet (50 mg total) by mouth every 6 (six) hours as needed.  . Water For Irrigation, Sterile (FREE WATER) SOLN Place 250 mLs into feeding tube every 8 (eight) hours.   No facility-administered encounter medications on file as of 07/10/2017.      Review of Systems   In general is not complaining of fever chills says his appetite is not that great appears he has lost about 4 pounds over the past several weeks.  Skin is not complaining of rashes or itching continues with craniotomy site right side of his head which appears relatively unchanged and do not see sign of infection.  Head ears eyes nose mouth and throat is not complaining of visual changes or sore throat.  Respiratory is not complaining cough or shortness of breath.  Cardiac is not complaining of chest pain or significant lower extremity edema.  GI does not complain of abdominal pain does complain of hard stools however is not really complaining of nausea or  vomiting.  GU is not complaining of dysuria.  Musculoskeletal does not really complain of joint pain at this time does have left-sided hemiparesis.  Neurologic as noted above does have left-sided hemiparesis does not complain of numbness or tingling he does complain of intermittent headaches.  Psych does not complain of overt anxiety or depression nursing staff does not report issues in this regard.     There is no immunization history on file for this patient. Pertinent  Health Maintenance Due  Topic Date Due  . INFLUENZA VACCINE  08/07/2017 (Originally 03/08/2017)  . FOOT EXAM  08/07/2017 (Originally  04/06/1971)  . OPHTHALMOLOGY EXAM  08/07/2017 (Originally 04/06/1971)  . URINE MICROALBUMIN  08/07/2017 (Originally 04/06/1971)  . COLONOSCOPY  08/07/2017 (Originally 04/06/2011)  . HEMOGLOBIN A1C  10/03/2017   No flowsheet data found. Functional Status Survey:    Vitals:   07/10/17 1501  BP: 129/76  Pulse: 89  Resp: 18  Temp: 97.7 F (36.5 C)  TempSrc: Oral  SpO2: 97%  Weight: 176 lb (79.8 kg)  Height: 5\' 6"  (1.676 m)  Of note manual blood pressure was 138/88.   Body mass index is 28.41 kg/m. Physical Exam In general this is a well-nourished middle-aged male in no distress lying comfortably in bed.  His skin is warm and dry continues to have a boggy swelling on the right side of his head this appears relatively unchanged I do not see sign of infection.  Eyes pupils appear reactive to light sclera and conjunctive are clear visual acuity appears grossly intact.  Oropharynx is clear mucous membranes moist.  Chest is clear to auscultation there is no labored breathing respiratory effort is somewhat poor.  Heart is regular rate and rhythm without murmur gallop or rub he does not really have significant lower extremity edema.  Abdomen is soft nontender with positive bowel sounds PEG site appears to be unremarkable.  Musculoskeletal continues with left-sided hemiparesis is able to move right upper and lower extremities limited exams bed.-- Does have left arm in a splin t Neurologic again continues with left-sided hemiparalysis  Speech is clear.  Psych he is alert and oriented pleasant and appropriate  Labs reviewed: Recent Labs    04/09/17 0407  04/11/17 0219  04/12/17 0151  06/06/17 0630 06/13/17 0700 06/29/17 0930  NA 148*   < > 143   < > 139   < > 134* 140 136  K 4.1   < > 3.9  --  3.8   < > 4.2 4.1 4.0  CL 115*   < > 111  --  106   < > 100* 102 101  CO2 28   < > 27  --  26   < > 28 28 29   GLUCOSE 251*   < > 209*  --  147*   < > 121* 129* 106*  BUN 18   < > 12   --  15   < > 23* 22* 23*  CREATININE 0.86   < > 0.82  --  0.75   < > 0.63 0.77 0.77  CALCIUM 8.3*   < > 8.7*  --  8.5*   < > 10.7* 11.4* 11.6*  MG 2.4  --  2.1  --  2.2  --   --   --   --   PHOS 3.7  --  3.6  --  4.4  --   --   --   --    < > = values in  this interval not displayed.   Recent Labs    05/02/17 0527 06/06/17 0630 06/13/17 0700  AST 19 15 20   ALT 20 32 47  ALKPHOS 60 62 64  BILITOT 0.2* 0.4 0.5  PROT 7.2 7.0 7.9  ALBUMIN 2.5* 3.2* 3.7   Recent Labs    04/26/17 0340  06/06/17 0630 06/13/17 0700 06/29/17 0930  WBC 16.0*   < > 12.3* 11.1* 10.0  NEUTROABS 10.7*  --   --  6.9 5.8  HGB 10.7*   < > 13.9 15.3 13.6  HCT 35.3*   < > 45.6 50.0 45.9  MCV 95.1   < > 91.8 91.1 90.9  PLT 560*   < > 176 200 184   < > = values in this interval not displayed.   No results found for: TSH Lab Results  Component Value Date   HGBA1C 14.3 (H) 04/02/2017   Lab Results  Component Value Date   CHOL 93 04/02/2017   HDL 28 (L) 04/02/2017   LDLCALC 38 04/02/2017   TRIG 135 04/02/2017   CHOLHDL 3.3 04/02/2017    Significant Diagnostic Results in last 30 days:  No results found.  Assessment/Plan  #1-history of CVA continues with left-sided hemiparesis he is being followed by neurology and will see them tomorrow-  He continues on Neurontin for apparent neuropathic discomfort this is at at bedtime-he is also on Eliquis per neurology.  He also is continuing with therapy.  2.  History of atrial fibrillation he continues on Eliquis he is on Lopressor for rate control this appears to be stable.  3.  History of type 1 diabetes on Levemir hemoglobin A1c was actually 14.3 in the hospital I suspect this reflects poor prior control- he continues on NovoLog with meals as well-blood sugars appear to be stable largely in the lower 100s occasionally 80-90 range but again this is fairly rare.  4.-History of anemia required blood transfusion in the hospital but hemoglobin has been stable  here at 13.6 will update this.  5.  History of posttraumatic headache he continues on Tylenol and tramadol as needed apparently this is helping-character of these headaches have not changed per patient.  6.  Dysphasia continues on a dysphagia diet-having attempts to advance this continues to have a PEG feeding at night-he is lost a small amount of weight this will have to be monitored and he is followed closely by speech therapy as well as dietary.  7.  History of pneumonia he does not really show evidence of this at this point will monitor at one point he did have a trach.  8.  Constipation he is having bowel movements but apparently these are somewhat difficult will change his Colace to routine twice a day hold for diarrhea and monitor.  9.  Hypertension at this point appears relatively stable there is some variability but at this point will monitor he is on Lopressor again this is for atrial fibrillation as well  Again will update a hemoglobin A1c with his history of diabetes I suspect this will show improvement- also will do a CBC with his history of anemia and a basic metabolic panel to keep an eye on his electrolytes and renal function.  [id number]

## 2017-07-11 ENCOUNTER — Encounter (HOSPITAL_COMMUNITY)
Admission: RE | Admit: 2017-07-11 | Discharge: 2017-07-11 | Disposition: A | Discharge status: Home / Self Care | Payer: Medicaid Other | Source: Skilled Nursing Facility | Attending: Internal Medicine | Admitting: Internal Medicine

## 2017-07-11 ENCOUNTER — Encounter: Payer: Self-pay | Admitting: Neurology

## 2017-07-11 ENCOUNTER — Ambulatory Visit (INDEPENDENT_AMBULATORY_CARE_PROVIDER_SITE_OTHER): Payer: Medicaid Other | Admitting: Neurology

## 2017-07-11 VITALS — BP 129/76 | HR 65

## 2017-07-11 DIAGNOSIS — D6489 Other specified anemias: Secondary | ICD-10-CM | POA: Insufficient documentation

## 2017-07-11 DIAGNOSIS — I63411 Cerebral infarction due to embolism of right middle cerebral artery: Secondary | ICD-10-CM

## 2017-07-11 DIAGNOSIS — E1165 Type 2 diabetes mellitus with hyperglycemia: Secondary | ICD-10-CM | POA: Diagnosis not present

## 2017-07-11 DIAGNOSIS — Z5189 Encounter for other specified aftercare: Secondary | ICD-10-CM | POA: Diagnosis present

## 2017-07-11 DIAGNOSIS — I63311 Cerebral infarction due to thrombosis of right middle cerebral artery: Secondary | ICD-10-CM | POA: Insufficient documentation

## 2017-07-11 LAB — BASIC METABOLIC PANEL
ANION GAP: 7 (ref 5–15)
BUN: 24 mg/dL — ABNORMAL HIGH (ref 6–20)
CALCIUM: 12.6 mg/dL — AB (ref 8.9–10.3)
CHLORIDE: 106 mmol/L (ref 101–111)
CO2: 27 mmol/L (ref 22–32)
CREATININE: 0.76 mg/dL (ref 0.61–1.24)
GFR calc non Af Amer: >60 mL/min (ref >60)
Glucose, Bld: 123 mg/dL — ABNORMAL HIGH (ref 65–99)
Potassium: 4.4 mmol/L (ref 3.5–5.1)
SODIUM: 140 mmol/L (ref 135–145)

## 2017-07-11 LAB — HEMOGLOBIN A1C
HEMOGLOBIN A1C: 5.8 % — AB (ref 4.8–5.6)
MEAN PLASMA GLUCOSE: 119.76 mg/dL

## 2017-07-11 LAB — CBC WITH DIFFERENTIAL/PLATELET
BASOS PCT: 1 %
Basophils Absolute: 0.1 10*3/uL (ref 0.0–0.1)
EOS ABS: 0.3 10*3/uL (ref 0.0–0.7)
EOS PCT: 3 %
HCT: 47.5 % (ref 39.0–52.0)
HEMOGLOBIN: 14.2 g/dL (ref 13.0–17.0)
LYMPHS ABS: 3.5 10*3/uL (ref 0.7–4.0)
Lymphocytes Relative: 30 %
MCH: 26.9 pg (ref 26.0–34.0)
MCHC: 29.9 g/dL — AB (ref 30.0–36.0)
MCV: 90.1 fL (ref 78.0–100.0)
MONOS PCT: 7 %
Monocytes Absolute: 0.8 10*3/uL (ref 0.1–1.0)
Neutro Abs: 7.2 10*3/uL (ref 1.7–7.7)
Neutrophils Relative %: 59 %
Platelets: 232 10*3/uL (ref 150–400)
RBC: 5.27 MIL/uL (ref 4.22–5.81)
RDW: 15.5 % (ref 11.5–15.5)
WBC: 11.9 10*3/uL — AB (ref 4.0–10.5)

## 2017-07-11 NOTE — Patient Instructions (Signed)
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 I also advised the patient to eat and to slowly reduce his PEG tube feeds so that the back tube can be removed eventually. Continue ongoing physical, occupational and speech therapy follow-up. Follow-up with neurosurgery for cranioplasty of the right skull defect in a couple of months. Return for follow-up with my nurse practitioner in 3 months or call earlier if necessary

## 2017-07-11 NOTE — Progress Notes (Signed)
[facility] [address] [place]. [place] [address] [phone]       OFFICE FOLLOW-UP NOTE  Joe. Joe Wilkinson Date of Birth:  1961-06-27 Medical Record Number:  [id number]   HPI: Joe Wilkinson is a 37 year african [place] male seen today for first office follow-up visit following hospital admission for stroke in August 2018.  He is accompanied by [facility] center nursing home attendant where he stays. No family is available. I have personally reviewed the electronic medical records and imaging films. :  Joe Wilkinson is an 56 y.o. male who presented to [facility] in DKA. He was found lethargic at [place] bus stop and then became unresponsive. EMS reported O2 sat of 80% and BP of 90/40. His CBG revealed hyperglycemia. On arrival to [facility] the patient patient could tell the examiner his name but was unable to provide any other information. He was restless, moaning and moving all extremities, not following commands. Labs were consistent with DKA and he was started on insulin drip. Initial CT head at [facility] showed no acute abnormality. He developed Kussmaul breathing requiring intubation. 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 He was transported to [facility] for further care. He   developed hypotension requiring pressors.  He was taken for an emergent right hemi-craniectomy for malignant cerebral edema by Dr. Cyndy Freeze and had a prolonged neuro ICU stay which was complicated by acute respiratory failure with hypoxia, hypercarbia, septic shock secondary to Streptococcus agalactiae  pneumonia.  He also developed new onset atrial fibrillation with rapid ventricular rate.  E. coli and Klebsiella urinary tract infection.  Perianal cellulitis with small abscess.  He had a prolonged ICU stay and was eventually weaned off ventilatory support.   Follow-up CT scan showed significant right hemispheric edema with herniation out outside craniotomy bone defect.  He underwent PEG tube placement by trauma team.  He was discharged on 05/29/17 to pain center for rehabilitation.  He is mental status has improved and he can speak and communicate but has persistent spastic dense hemiplegia.  He develops intermittent confusion.  He is still has persistent right-sided scalp swelling and has been seen in neurosurgery follow-up clinic on 06/21/17 and the decision to do cranioplasty has been postponed for a few more months until the swelling subsides.  He can eat a dysphagia 3 diet but is still getting mostly PEG tube feeds.  Patient has had a few falls.  He remains full support and requires 2 person support to stand and take a few steps.  He is getting ongoing physical occupational and speech therapy.    ROS 14 system review of systems is positive for weakness, gait difficulty, pain, scalp swelling and all other systems negative  PMH:  Past Medical History:  Diagnosis Date  . DM (diabetes mellitus) ([place])   . Hypertension   . Stroke [facility])     Social History:  Social History   Socioeconomic History  . Marital status: Unknown    Spouse name: Not on file  . Number of children: Not on file  . Years of education: Not on file  . Highest education level: Not on file  Social Needs  . Financial resource strain: Not on file  . Food insecurity - worry: Not on file  . Food insecurity - inability: Not on file  . Transportation needs - medical: Not  on file  . Transportation needs - non-medical: Not on file  Occupational History  . Not on file  Tobacco Use  . Smoking status: Former Smoker    Types: Cigarettes  . Smokeless tobacco: Never Used  . Tobacco comment: UTA  Substance and Sexual Activity  . Alcohol use: No    Comment: UTA  . Drug use: No    Comment: UTA  . Sexual activity: Not Currently    Birth control/protection: None  Other Topics  Concern  . Not on file  Social History Narrative  . Not on file    Medications:   Current Outpatient Medications on File Prior to Visit  Medication Sig Dispense Refill  . acetaminophen (TYLENOL) 325 MG tablet Place 650 mg into feeding tube every 4 (four) hours as needed.    . Amino Acids-Protein Hydrolys (FEEDING SUPPLEMENT, PRO-STAT SUGAR FREE 64,) LIQD Place 30 mLs into feeding tube 2 (two) times daily. 900 mL 0  . apixaban (ELIQUIS) 5 MG TABS tablet Place 1 tablet (5 mg total) into feeding tube 2 (two) times daily. 60 tablet   . bisacodyl (DULCOLAX) 10 MG suppository Place 1 suppository (10 mg total) rectally daily as needed for moderate constipation. 12 suppository 0  . chlorhexidine (PERIDEX) 0.12 % solution 15 mLs by Mouth Rinse route 2 (two) times daily. 120 mL 0  . gabapentin (NEURONTIN) 300 MG capsule Take 1 capsule (300 mg total) by mouth at bedtime.    Marland Kitchen glycopyrrolate (ROBINUL) 1 MG tablet Place 1 tablet (1 mg total) into feeding tube 2 (two) times daily.    Marland Kitchen guaiFENesin-codeine 100-10 MG/5ML syrup Take 5 mLs by mouth every 4 (four) hours as needed for cough. 120 mL 0  . insulin aspart (NOVOLOG) 100 UNIT/ML injection Inject 10 Units into the skin 3 (three) times daily before meals.    . insulin detemir (LEVEMIR) 100 UNIT/ML injection Inject 0.34 mLs (34 Units total) into the skin daily. 10 mL 11  . metoprolol tartrate (LOPRESSOR) 25 MG tablet Place 0.5 tablets (12.5 mg total) into feeding tube 2 (two) times daily.    . Nutritional Supplements (FEEDING SUPPLEMENT, GLUCERNA 1.2 CAL,) LIQD Place 1,000 mLs into feeding tube daily.    . pantoprazole sodium (PROTONIX) 40 mg/20 mL PACK Place 20 mLs (40 mg total) into feeding tube daily. 30 each   . traMADol (ULTRAM) 50 MG tablet Take 1 tablet (50 mg total) by mouth every 6 (six) hours as needed. 30 tablet 0  . Water For Irrigation, Sterile (FREE WATER) SOLN Place 250 mLs into feeding tube every 8 (eight) hours.    . docusate (COLACE) 50  MG/5ML liquid Place 10 mLs (100 mg total) into feeding tube 2 (two) times daily as needed for mild constipation. 100 mL 0  . feeding supplement, ENSURE ENLIVE, (ENSURE ENLIVE) LIQD Take 237 mLs by mouth 2 (two) times daily between meals. 237 mL 12   No current facility-administered medications on file prior to visit.     Allergies:  No Known Allergies  Physical Exam General: frail malnourished looking middle aged african american male Head: head normocephalic and atraumatic.  Neck: supple with no carotid or supraclavicular bruits Cardiovascular: regular rate and rhythm, no murmurs Musculoskeletal: no deformity Skin:  no rash/petichiae Vascular:  Normal pulses all extremities Vitals:   07/11/17 1553  BP: 129/76  Pulse: 65   Neurologic Exam Mental Status: Awake and fully alert. Oriented to place and time. Recent and remote memory intact. Attention span, concentration and  fund of knowledge appropriate. Mood and affect appropriate. Mild dysarthria Cranial Nerves: Fundoscopic exam reveals sharp disc margins. Pupils equal, briskly reactive to light. Extraocular movements full without nystagmus. Visual fields show dense left homonymous hemianopia to confrontation. Hearing intact. Facial sensation intact. Moderate left lower face weakness, tongue, palate moves normally and symmetrically.  Motor: Normal bulk and tone. Normal strength in all tested extremity muscles on right side with dense spastic left hemiplegia 0/5 with flexion contractures in left hand.spasticity left upper and lower extremity Sensory.: intact to touch ,pinprick .position and vibratory sensation.  Coordination: Rapid alternating movements normal in all extremities. Finger-to-nose and heel-to-shin performed accurately bilaterally. Gait and Station:unable to do Reflexes: 2+ and asymmetric and brisker on left. Toes downgoing on  Right and upgoing on left.   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 I also advised the patient to eat and to slowly reduce his PEG tube feeds so that the back tube can be removed eventually. Continue ongoing physical, occupational and speech therapy follow-up. Follow-up with neurosurgery for cranioplasty of the right skull defect in a couple of months. Return for follow-up with my nurse practitioner in 3 months or call earlier if necessary Greater than 50% of time during this 30 minute visit was spent on counseling,explanation of diagnosis, planning of further management, discussion with patient and family and coordination of care Antony Contras, MD  [facility] [address] [place], [place] [address]  Phone [phone] Fax [phone] Note: This document was prepared with digital dictation and possible smart phrase technology. Any transcriptional errors that result from this process are unintentional

## 2017-07-12 LAB — PARATHYROID HORMONE, INTACT (NO CA): PTH: 22 pg/mL (ref 15–65)

## 2017-07-13 ENCOUNTER — Encounter: Payer: Self-pay | Admitting: Internal Medicine

## 2017-07-13 ENCOUNTER — Non-Acute Institutional Stay (SKILLED_NURSING_FACILITY): Payer: Medicaid Other | Admitting: Internal Medicine

## 2017-07-13 ENCOUNTER — Encounter (HOSPITAL_COMMUNITY)
Admission: RE | Admit: 2017-07-13 | Discharge: 2017-07-13 | Disposition: A | Discharge status: Home / Self Care | Payer: Medicaid Other | Source: Skilled Nursing Facility | Attending: Internal Medicine | Admitting: Internal Medicine

## 2017-07-13 DIAGNOSIS — I63411 Cerebral infarction due to embolism of right middle cerebral artery: Secondary | ICD-10-CM | POA: Diagnosis not present

## 2017-07-13 DIAGNOSIS — Z5189 Encounter for other specified aftercare: Secondary | ICD-10-CM | POA: Diagnosis present

## 2017-07-13 DIAGNOSIS — I63311 Cerebral infarction due to thrombosis of right middle cerebral artery: Secondary | ICD-10-CM | POA: Diagnosis present

## 2017-07-13 DIAGNOSIS — D6489 Other specified anemias: Secondary | ICD-10-CM | POA: Diagnosis present

## 2017-07-13 DIAGNOSIS — Z9889 Other specified postprocedural states: Secondary | ICD-10-CM

## 2017-07-13 DIAGNOSIS — I10 Essential (primary) hypertension: Secondary | ICD-10-CM

## 2017-07-13 DIAGNOSIS — I480 Paroxysmal atrial fibrillation: Secondary | ICD-10-CM | POA: Diagnosis not present

## 2017-07-13 DIAGNOSIS — E1165 Type 2 diabetes mellitus with hyperglycemia: Secondary | ICD-10-CM | POA: Insufficient documentation

## 2017-07-13 DIAGNOSIS — E108 Type 1 diabetes mellitus with unspecified complications: Secondary | ICD-10-CM | POA: Diagnosis not present

## 2017-07-13 DIAGNOSIS — Z931 Gastrostomy status: Secondary | ICD-10-CM

## 2017-07-13 LAB — COMPREHENSIVE METABOLIC PANEL
ALT: 47 U/L (ref 17–63)
AST: 18 U/L (ref 15–41)
Albumin: 3.5 g/dL (ref 3.5–5.0)
Alkaline Phosphatase: 66 U/L (ref 38–126)
Anion gap: 8 (ref 5–15)
BILIRUBIN TOTAL: 0.5 mg/dL (ref 0.3–1.2)
BUN: 28 mg/dL — AB (ref 6–20)
CHLORIDE: 104 mmol/L (ref 101–111)
CO2: 26 mmol/L (ref 22–32)
CREATININE: 0.8 mg/dL (ref 0.61–1.24)
Calcium: 12.1 mg/dL — ABNORMAL HIGH (ref 8.9–10.3)
Glucose, Bld: 161 mg/dL — ABNORMAL HIGH (ref 65–99)
POTASSIUM: 4.1 mmol/L (ref 3.5–5.1)
Sodium: 138 mmol/L (ref 135–145)
TOTAL PROTEIN: 7.5 g/dL (ref 6.5–8.1)

## 2017-07-13 NOTE — Progress Notes (Signed)
Location:   [facility] Room Number: 139/W Place of Service:  SNF [id number]) Provider:  Clydene Fake, MD  Patient Care Team: Virgie Dad, MD as PCP - General (Internal Medicine)  Extended Emergency Contact Information Primary Emergency Contact: Theresia Bough [place] Mobile Phone: [phone] Relation: Sister Secondary Emergency Contact: Nickie Retort [place] Mobile Phone: [phone] Relation: Aunt  Code Status:  Full Code Goals of care: Advanced Directive information Advanced Directives 07/13/2017  Does Patient Have a Medical Advance Directive? Yes  Type of Advance Directive (No Data)  Does patient want to make changes to medical advance directive? No - Patient declined  Would patient like information on creating a medical advance directive? No - Patient declined     Chief Complaint  Patient presents with  . Acute Visit    Hypercalcemia    HPI:  Pt is a 56 y.o. male seen today for an acute visit for Persistent Hypercalcemia.  Patient has h/o Diabetes and Hypertension   Patient was admitted to SNF for Therapy and Possible Long term Care after being  in the Hospital from 08/23-10/22. He was initially admitted as he was found unresponsive near the Bus stop . In the hospital he was found to Have BS of over 1100 and he was in DKA Shock.Marland Kitchen He was intubated and was on Ventilator  And , Pressors. While in ICU he sustained Acute CVA and was transferred to [facility]. CT Scan showed Large Right MCA   infarct with Diffuse Edema and Mass effect. Underwent Decompressive right frontotemporoparietal craniectomy for decompression with duraplasty on 08/25. Patient also Developed PAF requiring Amiodarone which was stopped later. He was started on Eliquis as this was thought to be reason for his stroke. He required tracheostomy from 04/12/17- 10/17 Also Had PEG tube Feeds from 09/06. He is on   Dysphagia diet  And Tube feeds at night. Patient was seen today for worsening Hypercalcemia. His Serum Calcium level has been slowly going up and is 12.6 today. Patient does not have any new complains. He did have llot of complains aginst the Nursing staff and also was upset that his girlfriend is not planning to take him home   Past Medical History:  Diagnosis Date  . DM (diabetes mellitus) ([place])   . Hypertension   . Stroke [facility])    Past Surgical History:  Procedure Laterality Date  . CRANIECTOMY Right 04/01/2017   Procedure: RIGHT DECOMPRESSIVE CRANIECTOMY;  Surgeon: Ditty, Kevan Ny, MD;  Location: [place];  Service: Neurosurgery;  Laterality: Right;  . ESOPHAGOGASTRODUODENOSCOPY N/A 04/13/2017   Procedure: ESOPHAGOGASTRODUODENOSCOPY (EGD);  Surgeon: Georganna Skeans, MD;  Location: [facility];  Service: General;  Laterality: N/A;  bedside  . PEG PLACEMENT N/A 04/13/2017   Procedure: PERCUTANEOUS ENDOSCOPIC GASTROSTOMY (PEG) PLACEMENT;  Surgeon: Georganna Skeans, MD;  Location: [facility] ENDOSCOPY;  Service: General;  Laterality: N/A;    No Known Allergies  Outpatient Encounter Medications as of 07/13/2017  Medication Sig  . acetaminophen (TYLENOL) 325 MG tablet Place 650 mg into feeding tube every 4 (four) hours as needed.  . Amino Acids-Protein Hydrolys (FEEDING SUPPLEMENT, PRO-STAT SUGAR FREE 64,) LIQD Place 30 mLs into feeding tube 2 (two) times daily.  Marland Kitchen apixaban (ELIQUIS) 5 MG TABS tablet Place 1 tablet (5 mg total) into feeding tube 2 (two) times daily.  . bisacodyl (DULCOLAX) 10 MG suppository Place 1 suppository (10 mg total) rectally daily as needed for moderate constipation.  Marland Kitchen  chlorhexidine (PERIDEX) 0.12 % solution 15 mLs by Mouth Rinse route 2 (two) times daily.  Marland Kitchen docusate (COLACE) 50 MG/5ML liquid Place 10 mLs (100 mg total) into feeding tube 2 (two) times daily as needed for mild constipation.  . feeding supplement, ENSURE ENLIVE, (ENSURE ENLIVE) LIQD Take 237 mLs by mouth 2  (two) times daily between meals.  . gabapentin (NEURONTIN) 300 MG capsule Take 1 capsule (300 mg total) by mouth at bedtime.  Marland Kitchen glycopyrrolate (ROBINUL) 1 MG tablet Place 1 tablet (1 mg total) into feeding tube 2 (two) times daily.  Marland Kitchen guaiFENesin-codeine 100-10 MG/5ML syrup Take 5 mLs by mouth every 4 (four) hours as needed for cough.  . insulin aspart (NOVOLOG) 100 UNIT/ML injection Inject 10 Units into the skin 3 (three) times daily before meals.  . insulin detemir (LEVEMIR) 100 UNIT/ML injection Inject 0.34 mLs (34 Units total) into the skin daily.  . metoprolol tartrate (LOPRESSOR) 25 MG tablet Place 0.5 tablets (12.5 mg total) into feeding tube 2 (two) times daily.  . Nutritional Supplements (FEEDING SUPPLEMENT, GLUCERNA 1.2 CAL,) LIQD Glucerna 1.2 cal by peg tube @@ 65 ml/hr continuous feeding from 7 pm to 7 am. Twice a day  . pantoprazole sodium (PROTONIX) 40 mg/20 mL PACK Place 20 mLs (40 mg total) into feeding tube daily.  . traMADol (ULTRAM) 50 MG tablet Take 1 tablet (50 mg total) by mouth every 6 (six) hours as needed.  . Water For Irrigation, Sterile (FREE WATER) SOLN Place 250 mLs into feeding tube every 8 (eight) hours.  . [DISCONTINUED] Nutritional Supplements (FEEDING SUPPLEMENT, GLUCERNA 1.2 CAL,) LIQD Place 1,000 mLs into feeding tube daily. (Patient taking differently: Place 1,000 mLs into feeding tube 2 (two) times daily. )   No facility-administered encounter medications on file as of 07/13/2017.      Review of Systems  Review of Systems  Constitutional: Negative for activity change, appetite change, chills, diaphoresis, fatigue and fever.  HENT: Negative for mouth sores, postnasal drip, rhinorrhea, sinus pain and sore throat.   Respiratory: Negative for apnea, cough, chest tightness, shortness of breath and wheezing.   Cardiovascular: Negative for chest pain, palpitations and leg swelling.  Gastrointestinal: Negative for abdominal distention, abdominal pain, constipation,  diarrhea, nausea and vomiting.  Genitourinary: Negative for dysuria and frequency.  Musculoskeletal: Negative for arthralgias, joint swelling and myalgias.  Skin: Negative for rash.  Neurological: Negative for dizziness, syncope, weakness, light-headedness and numbness.  Psychiatric/Behavioral: Negative for behavioral problems, confusion and sleep disturbance.      There is no immunization history on file for this patient. Pertinent  Health Maintenance Due  Topic Date Due  . INFLUENZA VACCINE  08/07/2017 (Originally 03/08/2017)  . FOOT EXAM  08/07/2017 (Originally 04/06/1971)  . OPHTHALMOLOGY EXAM  08/07/2017 (Originally 04/06/1971)  . URINE MICROALBUMIN  08/07/2017 (Originally 04/06/1971)  . COLONOSCOPY  08/07/2017 (Originally 04/06/2011)  . HEMOGLOBIN A1C  01/09/2018   No flowsheet data found. Functional Status Survey:    Vitals:   07/14/17 0726  BP: 135/81  Pulse: 95  Resp: 18  Temp: (!) 97.5 F (36.4 C)   There is no height or weight on file to calculate BMI. Physical Exam  Constitutional: He appears well-developed and well-nourished.  HENT:  Head: Normocephalic.  Mouth/Throat: Oropharynx is clear and moist.  Eyes: Pupils are equal, round, and reactive to light.  Neck: Neck supple.  Cardiovascular: Normal rate and normal heart sounds. An irregular rhythm present.  Pulmonary/Chest: Effort normal and breath sounds normal. No respiratory distress.  He has no wheezes. He has no rales.  Abdominal: Soft. Bowel sounds are normal. He exhibits no distension. There is no tenderness. There is no rebound.  Musculoskeletal: He exhibits no edema.  Lymphadenopathy:    He has no cervical adenopathy.  Neurological: He is alert.  Has severe and dense Left hemiparesis with Neglect of left side.  Skin: Skin is warm and dry.  Psychiatric: His speech is normal. His affect is angry. He is slowed. He exhibits a depressed mood.    Labs reviewed: Recent Labs    04/09/17 0407  04/11/17 0219   04/12/17 0151  06/29/17 0930 07/11/17 1025 07/13/17 0719  NA 148*   < > 143   < > 139   < > 136 140 138  K 4.1   < > 3.9  --  3.8   < > 4.0 4.4 4.1  CL 115*   < > 111  --  106   < > 101 106 104  CO2 28   < > 27  --  26   < > 29 27 26   GLUCOSE 251*   < > 209*  --  147*   < > 106* 123* 161*  BUN 18   < > 12  --  15   < > 23* 24* 28*  CREATININE 0.86   < > 0.82  --  0.75   < > 0.77 0.76 0.80  CALCIUM 8.3*   < > 8.7*  --  8.5*   < > 11.6* 12.6* 12.1*  MG 2.4  --  2.1  --  2.2  --   --   --   --   PHOS 3.7  --  3.6  --  4.4  --   --   --   --    < > = values in this interval not displayed.   Recent Labs    06/06/17 0630 06/13/17 0700 07/13/17 0719  AST 15 20 18   ALT 32 47 47  ALKPHOS 62 64 66  BILITOT 0.4 0.5 0.5  PROT 7.0 7.9 7.5  ALBUMIN 3.2* 3.7 3.5   Recent Labs    06/13/17 0700 06/29/17 0930 07/11/17 1025  WBC 11.1* 10.0 11.9*  NEUTROABS 6.9 5.8 7.2  HGB 15.3 13.6 14.2  HCT 50.0 45.9 47.5  MCV 91.1 90.9 90.1  PLT 200 184 232   No results found for: TSH Lab Results  Component Value Date   HGBA1C 5.8 (H) 07/11/2017   Lab Results  Component Value Date   CHOL 93 04/02/2017   HDL 28 (L) 04/02/2017   LDLCALC 38 04/02/2017   TRIG 135 04/02/2017   CHOLHDL 3.3 04/02/2017    Significant Diagnostic Results in last 30 days:  No results found.  Assessment/Plan  Hypercalcemia On reviewing Patients labs his calcium level has been slowing increasing from 9.3 in 09/18 to 12.6 now. His BUN has been slowly increasing also. Plan d/w Dietician and speech therapy Patient has not been eating well mostly due to his Cognitive impairment. He has lost some weight also. Will incrase free water Flushes Q 6 hours. Check Vit D PTH and TSH If no improvement refer to Endocrinology.  Cerebrovascular accident (CVA) due to embolism of right middle cerebral artery  D/W Therapy Patient not doing well with therapy.  e gets very frustrated and has not made any progress. They are  planning to Cut him off. Unfortunately patient continue s to have dense Hemiplegia. He will need Full Care  and would not be able to go home.  AF (paroxysmal atrial fibrillation) Rate control on Metoprolol Tolerating Eliquis   Essential hypertension BP controlled on Lopressor  Type 1 diabetes mellitus  BS are well controlled right now on Levimir. A1C was 5.8 in 12/04  Status post craniectomy Follow up with Neurosurgery  S/P PEG D/W speech and  dietician Patient continues to have severe cognitive impairment impairment. He is not eating and has complete neglect of the food. They have upgraded his diet to see if it will improve his appetite but nothing has helped.  He has lost some weight recently. Continue PEG feedings right . Would consider Remeron if he does not he    Family/ staff Communication:   Labs/tests ordered:  Repeat Calcium Level. Total time spent in this patient care encounter was 45_ minutes; greater than 50% of the visit spent counseling patient, reviewing records , Labs and coordinating care for problems addressed at this encounter.

## 2017-07-14 ENCOUNTER — Encounter: Payer: Self-pay | Admitting: Internal Medicine

## 2017-07-14 DIAGNOSIS — Z9889 Other specified postprocedural states: Secondary | ICD-10-CM | POA: Insufficient documentation

## 2017-07-14 DIAGNOSIS — Z931 Gastrostomy status: Secondary | ICD-10-CM | POA: Insufficient documentation

## 2017-07-17 ENCOUNTER — Encounter (HOSPITAL_COMMUNITY)
Admission: RE | Admit: 2017-07-17 | Discharge: 2017-07-17 | Disposition: A | Discharge status: Home / Self Care | Payer: Medicaid Other | Source: Skilled Nursing Facility | Attending: *Deleted | Admitting: *Deleted

## 2017-07-17 DIAGNOSIS — E1165 Type 2 diabetes mellitus with hyperglycemia: Secondary | ICD-10-CM | POA: Diagnosis not present

## 2017-07-17 LAB — COMPREHENSIVE METABOLIC PANEL
ALBUMIN: 3.6 g/dL (ref 3.5–5.0)
ALT: 32 U/L (ref 17–63)
ANION GAP: 7 (ref 5–15)
AST: 14 U/L — ABNORMAL LOW (ref 15–41)
Alkaline Phosphatase: 59 U/L (ref 38–126)
BILIRUBIN TOTAL: 0.7 mg/dL (ref 0.3–1.2)
BUN: 23 mg/dL — ABNORMAL HIGH (ref 6–20)
CALCIUM: 11.9 mg/dL — AB (ref 8.9–10.3)
CO2: 27 mmol/L (ref 22–32)
Chloride: 104 mmol/L (ref 101–111)
Creatinine, Ser: 0.72 mg/dL (ref 0.61–1.24)
GLUCOSE: 147 mg/dL — AB (ref 65–99)
POTASSIUM: 4.2 mmol/L (ref 3.5–5.1)
Sodium: 138 mmol/L (ref 135–145)
TOTAL PROTEIN: 7.5 g/dL (ref 6.5–8.1)

## 2017-07-17 LAB — TSH: TSH: 0.913 u[IU]/mL (ref 0.350–4.500)

## 2017-07-18 LAB — VITAMIN D 25 HYDROXY (VIT D DEFICIENCY, FRACTURES): VIT D 25 HYDROXY: 17.5 ng/mL — AB (ref 30.0–100.0)

## 2017-07-20 ENCOUNTER — Encounter (HOSPITAL_COMMUNITY)
Admission: RE | Admit: 2017-07-20 | Discharge: 2017-07-20 | Disposition: A | Discharge status: Home / Self Care | Payer: Medicaid Other | Source: Skilled Nursing Facility | Attending: Internal Medicine | Admitting: Internal Medicine

## 2017-07-20 DIAGNOSIS — Z5189 Encounter for other specified aftercare: Secondary | ICD-10-CM | POA: Insufficient documentation

## 2017-07-20 DIAGNOSIS — D6489 Other specified anemias: Secondary | ICD-10-CM | POA: Insufficient documentation

## 2017-07-20 DIAGNOSIS — E1165 Type 2 diabetes mellitus with hyperglycemia: Secondary | ICD-10-CM | POA: Insufficient documentation

## 2017-07-20 DIAGNOSIS — I63311 Cerebral infarction due to thrombosis of right middle cerebral artery: Secondary | ICD-10-CM | POA: Diagnosis present

## 2017-07-20 LAB — COMPREHENSIVE METABOLIC PANEL
ALK PHOS: 62 U/L (ref 38–126)
ALT: 33 U/L (ref 17–63)
AST: 21 U/L (ref 15–41)
Albumin: 3.8 g/dL (ref 3.5–5.0)
Anion gap: 9 (ref 5–15)
BUN: 25 mg/dL — AB (ref 6–20)
CALCIUM: 11.7 mg/dL — AB (ref 8.9–10.3)
CO2: 24 mmol/L (ref 22–32)
CREATININE: 0.77 mg/dL (ref 0.61–1.24)
Chloride: 105 mmol/L (ref 101–111)
Glucose, Bld: 178 mg/dL — ABNORMAL HIGH (ref 65–99)
Potassium: 3.9 mmol/L (ref 3.5–5.1)
SODIUM: 138 mmol/L (ref 135–145)
Total Bilirubin: 0.6 mg/dL (ref 0.3–1.2)
Total Protein: 7.7 g/dL (ref 6.5–8.1)

## 2017-07-27 ENCOUNTER — Encounter (HOSPITAL_COMMUNITY)
Admission: RE | Admit: 2017-07-27 | Discharge: 2017-07-27 | Disposition: A | Discharge status: Home / Self Care | Payer: Medicaid Other | Source: Skilled Nursing Facility | Attending: Internal Medicine | Admitting: Internal Medicine

## 2017-07-27 DIAGNOSIS — D6489 Other specified anemias: Secondary | ICD-10-CM | POA: Diagnosis present

## 2017-07-27 DIAGNOSIS — Z5189 Encounter for other specified aftercare: Secondary | ICD-10-CM | POA: Diagnosis present

## 2017-07-27 DIAGNOSIS — I63311 Cerebral infarction due to thrombosis of right middle cerebral artery: Secondary | ICD-10-CM | POA: Insufficient documentation

## 2017-07-27 DIAGNOSIS — E1165 Type 2 diabetes mellitus with hyperglycemia: Secondary | ICD-10-CM | POA: Diagnosis not present

## 2017-07-27 LAB — COMPREHENSIVE METABOLIC PANEL
ALBUMIN: 3.3 g/dL — AB (ref 3.5–5.0)
ALT: 26 U/L (ref 17–63)
AST: 16 U/L (ref 15–41)
Alkaline Phosphatase: 61 U/L (ref 38–126)
Anion gap: 10 (ref 5–15)
BUN: 19 mg/dL (ref 6–20)
CHLORIDE: 104 mmol/L (ref 101–111)
CO2: 24 mmol/L (ref 22–32)
Calcium: 11 mg/dL — ABNORMAL HIGH (ref 8.9–10.3)
Creatinine, Ser: 0.76 mg/dL (ref 0.61–1.24)
GFR calc Af Amer: >60 mL/min (ref >60)
Glucose, Bld: 158 mg/dL — ABNORMAL HIGH (ref 65–99)
POTASSIUM: 3.8 mmol/L (ref 3.5–5.1)
Sodium: 138 mmol/L (ref 135–145)
Total Bilirubin: 0.7 mg/dL (ref 0.3–1.2)
Total Protein: 7.3 g/dL (ref 6.5–8.1)

## 2017-07-27 LAB — ACID FAST SMEAR (AFB)

## 2017-07-27 LAB — ACID FAST SMEAR (AFB, MYCOBACTERIA): Acid Fast Smear: NEGATIVE

## 2017-07-28 LAB — ACID FAST CULTURE WITH REFLEXED SENSITIVITIES (MYCOBACTERIA)

## 2017-07-28 LAB — ACID FAST CULTURE WITH REFLEXED SENSITIVITIES: ACID FAST CULTURE - AFSCU3: NEGATIVE

## 2017-08-03 ENCOUNTER — Encounter (HOSPITAL_COMMUNITY)
Admission: RE | Admit: 2017-08-03 | Discharge: 2017-08-03 | Disposition: A | Discharge status: Home / Self Care | Payer: Medicaid Other | Source: Skilled Nursing Facility | Attending: *Deleted | Admitting: *Deleted

## 2017-08-03 DIAGNOSIS — E1165 Type 2 diabetes mellitus with hyperglycemia: Secondary | ICD-10-CM | POA: Diagnosis not present

## 2017-08-03 LAB — BASIC METABOLIC PANEL
Anion gap: 9 (ref 5–15)
BUN: 21 mg/dL — ABNORMAL HIGH (ref 6–20)
CALCIUM: 11.1 mg/dL — AB (ref 8.9–10.3)
CHLORIDE: 104 mmol/L (ref 101–111)
CO2: 25 mmol/L (ref 22–32)
CREATININE: 0.8 mg/dL (ref 0.61–1.24)
GFR calc Af Amer: >60 mL/min (ref >60)
GFR calc non Af Amer: >60 mL/min (ref >60)
GLUCOSE: 143 mg/dL — AB (ref 65–99)
Potassium: 4.2 mmol/L (ref 3.5–5.1)
Sodium: 138 mmol/L (ref 135–145)

## 2017-08-09 ENCOUNTER — Other Ambulatory Visit: Payer: Self-pay

## 2017-08-09 MED ORDER — TRAMADOL HCL 50 MG PO TABS: 50.0000 mg | ORAL_TABLET | Freq: Four times a day (QID) | ORAL | 0 refills | Status: DC | PRN

## 2017-08-09 NOTE — Telephone Encounter (Signed)
RX Fax for [facility]@ [phone]  

## 2017-08-10 ENCOUNTER — Encounter (HOSPITAL_COMMUNITY)
Admission: RE | Admit: 2017-08-10 | Discharge: 2017-08-10 | Disposition: A | Discharge status: Home / Self Care | Payer: Medicaid Other | Source: Skilled Nursing Facility | Attending: Internal Medicine | Admitting: Internal Medicine

## 2017-08-10 ENCOUNTER — Non-Acute Institutional Stay (SKILLED_NURSING_FACILITY): Payer: Medicaid Other | Admitting: Internal Medicine

## 2017-08-10 ENCOUNTER — Encounter: Payer: Self-pay | Admitting: Internal Medicine

## 2017-08-10 DIAGNOSIS — E1165 Type 2 diabetes mellitus with hyperglycemia: Secondary | ICD-10-CM | POA: Insufficient documentation

## 2017-08-10 DIAGNOSIS — Z931 Gastrostomy status: Secondary | ICD-10-CM

## 2017-08-10 DIAGNOSIS — I10 Essential (primary) hypertension: Secondary | ICD-10-CM | POA: Diagnosis not present

## 2017-08-10 DIAGNOSIS — I480 Paroxysmal atrial fibrillation: Secondary | ICD-10-CM | POA: Diagnosis not present

## 2017-08-10 DIAGNOSIS — E108 Type 1 diabetes mellitus with unspecified complications: Secondary | ICD-10-CM

## 2017-08-10 DIAGNOSIS — I63411 Cerebral infarction due to embolism of right middle cerebral artery: Secondary | ICD-10-CM | POA: Diagnosis not present

## 2017-08-10 DIAGNOSIS — Z9889 Other specified postprocedural states: Secondary | ICD-10-CM

## 2017-08-10 LAB — BASIC METABOLIC PANEL
Anion gap: 11 (ref 5–15)
BUN: 24 mg/dL — AB (ref 6–20)
CHLORIDE: 104 mmol/L (ref 101–111)
CO2: 25 mmol/L (ref 22–32)
Calcium: 11.3 mg/dL — ABNORMAL HIGH (ref 8.9–10.3)
Creatinine, Ser: 0.84 mg/dL (ref 0.61–1.24)
GFR calc Af Amer: >60 mL/min (ref >60)
GLUCOSE: 150 mg/dL — AB (ref 65–99)
Potassium: 4.2 mmol/L (ref 3.5–5.1)
Sodium: 140 mmol/L (ref 135–145)

## 2017-08-10 NOTE — Progress Notes (Signed)
Location:   [place] Room Number: 139/W Place of Service:  SNF ([id number]) Provider:  Clydene Fake, MD  Patient Care Team: Virgie Dad, MD as PCP - General (Internal Medicine)  Extended Emergency Contact Information Primary Emergency Contact: Theresia Bough [place] of [place] Phone: [phone] Relation: Sister Secondary Emergency Contact: Nickie Retort [place] of [place] Mobile Phone: [phone] Relation: Aunt  Code Status:  Full Code Goals of care: Advanced Directive information Advanced Directives 08/10/2017  Does Patient Have a Medical Advance Directive? Yes  Type of Advance Directive (No Data)  Does patient want to make changes to medical advance directive? No - Patient declined  Would patient like information on creating a medical advance directive? No - Patient declined     Chief Complaint  Patient presents with  . Medical Management of Chronic Issues    Routine Visit,Due Flu Shot,Pneumonia Shot, Colonoscopy,T-Dap, Albumin, Hep-C,DM Foot and Eye Exams    HPI:  Pt is a 57 y.o. male seen today for medical management of chronic diseases.    Patient with h/o h/o Diabetes and Hypertension  was admitted to SNF for Therapy and Possible Long term Care after being  in the Hospital from 08/23-10/22. He was initially admitted in the hospital  as he was found unresponsive near the Bus stop . In the hospital he was found to Have BS of over 1100 and he was in DKA Shock.Marland Kitchen He was intubated and was on Ventilator And , Pressors. While in ICU he sustained Acute CVA and was transferred to [facility] from [facility] CT Scan showed Large Right MCA infarct with Diffuse Edema and Mass effect. He UnderwentDecompressive right frontotemporoparietal craniectomy for decompression with duraplastyon 08/25. Patient also Developed PAF requiring Amiodarone which was stopped later. He was started on Eliquis as this was thought to be  reason for his stroke. He required tracheostomy from 04/12/17- 10/17 Also Had PEG tube Feeds from 09/06.   Patient has not done with therapy in the facility and therapy has stopped working with him. He Continues to not eat  mostly due to his Cognitive Impairment. He is still on PEG tube Feedings. According to the patient he states he does not like the food but the nurses have tried different choices with him but he continues not to eat. Patient also developed hypercalcemia.  With his calcium peaking at 12.6.  It has responded to increase water flushes through the PEG tube and is now down to 11.3.  Patient continues to be asymptomatic. Patient need full Nursing Care. Physical his weight seems to be stable at 175 lbs.   Past Medical History:  Diagnosis Date  . DM (diabetes mellitus) ([place])   . Hypertension   . Stroke [facility])    Past Surgical History:  Procedure Laterality Date  . CRANIECTOMY Right 04/01/2017   Procedure: RIGHT DECOMPRESSIVE CRANIECTOMY;  Surgeon: Ditty, Kevan Ny, MD;  Location: [place];  Service: Neurosurgery;  Laterality: Right;  . ESOPHAGOGASTRODUODENOSCOPY N/A 04/13/2017   Procedure: ESOPHAGOGASTRODUODENOSCOPY (EGD);  Surgeon: Georganna Skeans, MD;  Location: [place];  Service: General;  Laterality: N/A;  bedside  . PEG PLACEMENT N/A 04/13/2017   Procedure: PERCUTANEOUS ENDOSCOPIC GASTROSTOMY (PEG) PLACEMENT;  Surgeon: Georganna Skeans, MD;  [facility];  Service: General;  Laterality: N/A;    No Known Allergies  Outpatient Encounter Medications as of 08/10/2017  Medication Sig  . acetaminophen (TYLENOL) 325 MG tablet Place 650 mg into feeding tube every 4 (four)  hours as needed.  . Amino Acids-Protein Hydrolys (FEEDING SUPPLEMENT, PRO-STAT SUGAR FREE 64,) LIQD Take 30 mLs by mouth daily. Per gastric tube  . apixaban (ELIQUIS) 5 MG TABS tablet Place 1 tablet (5 mg total) into feeding tube 2 (two) times daily.  . bisacodyl (DULCOLAX) 10 MG suppository Place  1 suppository (10 mg total) rectally daily as needed for moderate constipation.  . chlorhexidine (PERIDEX) 0.12 % solution 15 mLs by Mouth Rinse route 2 (two) times daily.  Marland Kitchen docusate (COLACE) 50 MG/5ML liquid Place 10 mLs (100 mg total) into feeding tube 2 (two) times daily as needed for mild constipation.  . docusate (COLACE) 50 MG/5ML liquid Take 100 mg by mouth 2 (two) times daily.  . feeding supplement, ENSURE ENLIVE, (ENSURE ENLIVE) LIQD Take 237 mLs by mouth 2 (two) times daily between meals.  . gabapentin (NEURONTIN) 300 MG capsule Take 1 capsule (300 mg total) by mouth at bedtime.  Marland Kitchen glycopyrrolate (ROBINUL) 1 MG tablet Place 1 tablet (1 mg total) into feeding tube 2 (two) times daily.  Marland Kitchen guaiFENesin-codeine 100-10 MG/5ML syrup Take 5 mLs by mouth every 4 (four) hours as needed for cough.  . insulin aspart (NOVOLOG) 100 UNIT/ML injection Inject 10 Units into the skin 3 (three) times daily before meals.  . insulin detemir (LEVEMIR) 100 UNIT/ML injection Inject 0.34 mLs (34 Units total) into the skin daily.  . metoprolol tartrate (LOPRESSOR) 25 MG tablet Place 0.5 tablets (12.5 mg total) into feeding tube 2 (two) times daily.  . Nutritional Supplements (FEEDING SUPPLEMENT, GLUCERNA 1.2 CAL,) LIQD Glucerna 1.2 cal by peg tube Three times a day flush with 30 ml free water before and after administering full volume of TF.  . pantoprazole sodium (PROTONIX) 40 mg/20 mL PACK Place 20 mLs (40 mg total) into feeding tube daily.  . traMADol (ULTRAM) 50 MG tablet Take 1 tablet (50 mg total) by mouth every 6 (six) hours as needed.  . [DISCONTINUED] Amino Acids-Protein Hydrolys (FEEDING SUPPLEMENT, PRO-STAT SUGAR FREE 64,) LIQD Place 30 mLs into feeding tube 2 (two) times daily. (Patient taking differently: Place 30 mLs into feeding tube daily. )  . [DISCONTINUED] Water For Irrigation, Sterile (FREE WATER) SOLN Place 250 mLs into feeding tube every 8 (eight) hours. (Patient taking differently: Place 300  mLs into feeding tube every 6 (six) hours. )   No facility-administered encounter medications on file as of 08/10/2017.      Review of Systems Not reliable Due  to his Cognitive Impairment  Constitutional: Negative for activity change, appetite change, chills, diaphoresis, fatigue and fever.  HENT: Negative for mouth sores, postnasal drip, rhinorrhea, sinus pain and sore throat.   Respiratory: Negative for apnea, cough, chest tightness, shortness of breath and wheezing.   Cardiovascular: Negative for chest pain, palpitations and leg swelling.  Gastrointestinal: Negative for abdominal distention, abdominal pain, constipation, diarrhea, nausea and vomiting.  Genitourinary: Negative for dysuria and frequency.  Musculoskeletal: Negative for arthralgias, joint swelling and myalgias.  Skin: Negative for rash.  Neurological: Negative for dizziness, syncope, weakness, light-headedness and numbness.  Psychiatric/Behavioral: Negative for behavioral problems, confusion and sleep disturbance.      There is no immunization history on file for this patient. Pertinent  Health Maintenance Due  Topic Date Due  . INFLUENZA VACCINE  09/10/2017 (Originally 03/08/2017)  . FOOT EXAM  09/10/2017 (Originally 04[birth year]06/1971)  . OPHTHALMOLOGY EXAM  09/10/2017 (Originally 04/06/1971)  . URINE MICROALBUMIN  09/10/2017 (Originally 04/06/1971)  . COLONOSCOPY  09/10/2017 (Originally 04/06/2011)  .  HEMOGLOBIN A1C  01/09/2018   No flowsheet data found. Functional Status Survey:    Vitals:   08/10/17 1407  BP: 128/70  Pulse: 88  Resp: 18  Temp: 98.1 F (36.7 C)  TempSrc: Oral  SpO2: 99%   There is no height or weight on file to calculate BMI. Physical Exam  Constitutional: He appears well-developed and well-nourished.  HENT:  Head: Normocephalic.  Mouth/Throat: Oropharynx is clear and moist.  Eyes: Pupils are equal, round, and reactive to light.  Neck: Neck supple.  Cardiovascular: Normal rate, regular rhythm  and normal heart sounds.  No murmur heard. Pulmonary/Chest: Effort normal and breath sounds normal. No respiratory distress. He has no wheezes. He has no rales.  Abdominal: Soft. Bowel sounds are normal. He exhibits no distension. There is no tenderness. There is no rebound.  Musculoskeletal:  Trace Edema  Lymphadenopathy:    He has no cervical adenopathy.  Neurological: He is alert.  Oriented to place but did not know the Name of facility. Not Oriented to time. Has dense Left  Hemiparesis.   Skin: Skin is warm and dry.  Psychiatric: He has a normal mood and affect. His behavior is normal.    Labs reviewed: Recent Labs    04/09/17 0407  04/11/17 0219  04/12/17 0151  07/27/17 0704 08/03/17 0900 08/10/17 0800  NA 148*   < > 143   < > 139   < > 138 138 140  K 4.1   < > 3.9  --  3.8   < > 3.8 4.2 4.2  CL 115*   < > 111  --  106   < > 104 104 104  CO2 28   < > 27  --  26   < > 24 25 25   GLUCOSE 251*   < > 209*  --  147*   < > 158* 143* 150*  BUN 18   < > 12  --  15   < > 19 21* 24*  CREATININE 0.86   < > 0.82  --  0.75   < > 0.76 0.80 0.84  CALCIUM 8.3*   < > 8.7*  --  8.5*   < > 11.0* 11.1* 11.3*  MG 2.4  --  2.1  --  2.2  --   --   --   --   PHOS 3.7  --  3.6  --  4.4  --   --   --   --    < > = values in this interval not displayed.   Recent Labs    07/17/17 0330 07/20/17 1100 07/27/17 0704  AST 14* 21 16  ALT 32 33 26  ALKPHOS 59 62 61  BILITOT 0.7 0.6 0.7  PROT 7.5 7.7 7.3  ALBUMIN 3.6 3.8 3.3*   Recent Labs    06/13/17 0700 06/29/17 0930 07/11/17 1025  WBC 11.1* 10.0 11.9*  NEUTROABS 6.9 5.8 7.2  HGB 15.3 13.6 14.2  HCT 50.0 45.9 47.5  MCV 91.1 90.9 90.1  PLT 200 184 232   Lab Results  Component Value Date   TSH 0.913 07/17/2017   Lab Results  Component Value Date   HGBA1C 5.8 (H) 07/11/2017   Lab Results  Component Value Date   CHOL 93 04/02/2017   HDL 28 (L) 04/02/2017   LDLCALC 38 04/02/2017   TRIG 135 04/02/2017   CHOLHDL 3.3 04/02/2017      Significant Diagnostic Results in last 30 days:  No  results found.  Assessment/Plan  Type 1 diabetes mellitus with complications  Last P8E was 5.8 in 12/04 BS are running mostly less then 150 Will discontinue Bolus Novolog. Continue only on Levimir  AF (paroxysmal atrial fibrillation)  In Sinus Rhythm Continues on Eliquis  Cerebrovascular accident (CVA) due to embolism of right middle cerebral artery  Has failed therapy Continues to Need Full Nursing Support. Follows with Neurology LDL Less then 60 Not On any statin BP controlled  Essential hypertension On Metoprolol  S/P PEG Placement D/W Dietician, Nurses and Speech Patient  Is not eating . Continues to completely ignore food Will try Remeron QHS to stimulate appetite. Continue PEG tube Boluses of Glycerna but they are usually done after his meal time.  Status post craniectomy Has Follow up With Dr Cyndy Freeze.  Hypercalcemia Calcium right now stable at 11.3 Vit D low PTH level Normal Refer to Dr Dorris Fetch for Consult. Medications reviewed Discontinue Robitussin, Ultram, Robinul, Started on Miralax for Constipation.    Family/ staff Communication:   Labs/tests ordered:  Repeat Bmp to follow Calcium in 2 weeks  Total time spent in this patient care encounter was _45 minutes; greater than 50% of the visit spent counseling patient, reviewing records , Labs and coordinating care for problems addressed at this encounter.

## 2017-08-11 NOTE — Progress Notes (Signed)
This encounter was created in error - please disregard.

## 2017-08-30 ENCOUNTER — Encounter: Payer: Self-pay | Admitting: Internal Medicine

## 2017-08-30 NOTE — Progress Notes (Signed)
Location:   [place] Room Number: 139/W Place of Service:  SNF [phone]) Provider:  Freddi Starr, MD  Patient Care Team: Virgie Dad, MD as PCP - General (Internal Medicine)  Extended Emergency Contact Information Primary Emergency Contact: Theresia Bough [place] of [place] Mobile Phone: [phone] Relation: Sister Secondary Emergency Contact: Nickie Retort [place] of [place] Mobile Phone: [phone] Relation: Aunt  Code Status:   Goals of care: Advanced Directive information Advanced Directives 08/30/2017  Does Patient Have a Medical Advance Directive? Yes  Type of Advance Directive (No Data)  Does patient want to make changes to medical advance directive? No - Patient declined  Would patient like information on creating a medical advance directive? No - Patient declined     Chief Complaint  Patient presents with  . Medical Management of Chronic Issues    Routine Visit    HPI:  Pt is a 57 y.o. male seen today for medical management of chronic diseases.     Past Medical History:  Diagnosis Date  . DM (diabetes mellitus) ([place])   . Hypertension   . Stroke [facility])    Past Surgical History:  Procedure Laterality Date  . CRANIECTOMY Right 04/01/2017   Procedure: RIGHT DECOMPRESSIVE CRANIECTOMY;  Surgeon: Ditty, Kevan Ny, MD;  Location: [place];  Service: Neurosurgery;  Laterality: Right;  . ESOPHAGOGASTRODUODENOSCOPY N/A 04/13/2017   Procedure: ESOPHAGOGASTRODUODENOSCOPY (EGD);  Surgeon: Georganna Skeans, MD;  Location: [place];  Service: General;  Laterality: N/A;  bedside  . PEG PLACEMENT N/A 04/13/2017   Procedure: PERCUTANEOUS ENDOSCOPIC GASTROSTOMY (PEG) PLACEMENT;  Surgeon: Georganna Skeans, MD;  Location: [facility] ENDOSCOPY;  Service: General;  Laterality: N/A;    No Known Allergies  Outpatient Encounter Medications as of 08/30/2017  Medication Sig  . acetaminophen (TYLENOL) 325 MG tablet Place 650  mg into feeding tube every 4 (four) hours as needed.  . Amino Acids-Protein Hydrolys (FEEDING SUPPLEMENT, PRO-STAT SUGAR FREE 64,) LIQD Take 30 mLs by mouth daily. Per gastric tube  . apixaban (ELIQUIS) 5 MG TABS tablet Place 1 tablet (5 mg total) into feeding tube 2 (two) times daily.  . bisacodyl (DULCOLAX) 10 MG suppository Place 1 suppository (10 mg total) rectally daily as needed for moderate constipation.  . chlorhexidine (PERIDEX) 0.12 % solution 15 mLs by Mouth Rinse route 2 (two) times daily.  Marland Kitchen docusate (COLACE) 50 MG/5ML liquid Place 10 mLs (100 mg total) into feeding tube 2 (two) times daily as needed for mild constipation.  . docusate (COLACE) 50 MG/5ML liquid Take 100 mg by mouth 2 (two) times daily.  . feeding supplement, ENSURE ENLIVE, (ENSURE ENLIVE) LIQD Take 237 mLs by mouth 2 (two) times daily between meals.  . gabapentin (NEURONTIN) 300 MG capsule Take 1 capsule (300 mg total) by mouth at bedtime.  . insulin detemir (LEVEMIR) 100 UNIT/ML injection Inject 0.34 mLs (34 Units total) into the skin daily.  . metoprolol tartrate (LOPRESSOR) 25 MG tablet Place 0.5 tablets (12.5 mg total) into feeding tube 2 (two) times daily.  . mirtazapine (REMERON) 15 MG tablet Take 7.5 mg by mouth at bedtime.  . Nutritional Supplements (FEEDING SUPPLEMENT, GLUCERNA 1.2 CAL,) LIQD Glucerna 1.2 cal by peg tube Three times a day flush with 30 ml free water before and after administering full volume of TF.  . pantoprazole sodium (PROTONIX) 40 mg/20 mL PACK Place 20 mLs (40 mg total) into feeding tube daily.  . polyethylene glycol (MIRALAX / GLYCOLAX) packet Place  17 g into feeding tube daily.  . [DISCONTINUED] glycopyrrolate (ROBINUL) 1 MG tablet Place 1 tablet (1 mg total) into feeding tube 2 (two) times daily.  . [DISCONTINUED] guaiFENesin-codeine 100-10 MG/5ML syrup Take 5 mLs by mouth every 4 (four) hours as needed for cough.  . [DISCONTINUED] insulin aspart (NOVOLOG) 100 UNIT/ML injection Inject 10  Units into the skin 3 (three) times daily before meals.  . [DISCONTINUED] traMADol (ULTRAM) 50 MG tablet Take 1 tablet (50 mg total) by mouth every 6 (six) hours as needed.   No facility-administered encounter medications on file as of 08/30/2017.      Review of Systems   There is no immunization history on file for this patient. Pertinent  Health Maintenance Due  Topic Date Due  . INFLUENZA VACCINE  09/10/2017 (Originally 03/08/2017)  . OPHTHALMOLOGY EXAM  09/10/2017 (Originally 04/06/1971)  . URINE MICROALBUMIN  09/10/2017 (Originally [birth year]06/1971)  . COLONOSCOPY  09/10/2017 (Originally 04/06/2011)  . HEMOGLOBIN A1C  01/09/2018  . FOOT EXAM  08/24/2018   No flowsheet data found. Functional Status Survey:    Vitals:   08/30/17 1240  BP: (!) 100/44  Pulse: 90  Resp: 16  Temp: 98 F (36.7 C)  TempSrc: Oral  SpO2: 97%  Weight: 171 lb 9.6 oz (77.8 kg)  Height: 5\' 6"  (1.676 m)   Body mass index is 27.7 kg/m. Physical Exam  Labs reviewed: Recent Labs    04/09/17 0407  04/11/17 0219  04/12/17 0151  07/27/17 0704 08/03/17 0900 08/10/17 0800  NA 148*   < > 143   < > 139   < > 138 138 140  K 4.1   < > 3.9  --  3.8   < > 3.8 4.2 4.2  CL 115*   < > 111  --  106   < > 104 104 104  CO2 28   < > 27  --  26   < > 24 25 25   GLUCOSE 251*   < > 209*  --  147*   < > 158* 143* 150*  BUN 18   < > 12  --  15   < > 19 21* 24*  CREATININE 0.86   < > 0.82  --  0.75   < > 0.76 0.80 0.84  CALCIUM 8.3*   < > 8.7*  --  8.5*   < > 11.0* 11.1* 11.3*  MG 2.4  --  2.1  --  2.2  --   --   --   --   PHOS 3.7  --  3.6  --  4.4  --   --   --   --    < > = values in this interval not displayed.   Recent Labs    07/17/17 0330 07/20/17 1100 07/27/17 0704  AST 14* 21 16  ALT 32 33 26  ALKPHOS 59 62 61  BILITOT 0.7 0.6 0.7  PROT 7.5 7.7 7.3  ALBUMIN 3.6 3.8 3.3*   Recent Labs    06/13/17 0700 06/29/17 0930 07/11/17 1025  WBC 11.1* 10.0 11.9*  NEUTROABS 6.9 5.8 7.2  HGB 15.3 13.6 14.2    HCT 50.0 45.9 47.5  MCV 91.1 90.9 90.1  PLT 200 184 232   Lab Results  Component Value Date   TSH 0.913 07/17/2017   Lab Results  Component Value Date   HGBA1C 5.8 (H) 07/11/2017   Lab Results  Component Value Date   CHOL 93 04/02/2017  HDL 28 (L) 04/02/2017   LDLCALC 38 04/02/2017   TRIG 135 04/02/2017   CHOLHDL 3.3 04/02/2017    Significant Diagnostic Results in last 30 days:  No results found.  Assessment/Plan There are no diagnoses linked to this encounter.   Family/ staff Communication:   Labs/tests ordered:      This encounter was created in error - please disregard.

## 2017-09-04 ENCOUNTER — Encounter: Payer: Self-pay | Admitting: "Endocrinology

## 2017-09-04 ENCOUNTER — Non-Acute Institutional Stay (SKILLED_NURSING_FACILITY): Payer: Medicaid Other | Admitting: Internal Medicine

## 2017-09-04 ENCOUNTER — Ambulatory Visit (INDEPENDENT_AMBULATORY_CARE_PROVIDER_SITE_OTHER): Payer: Medicaid Other | Admitting: "Endocrinology

## 2017-09-04 ENCOUNTER — Encounter (HOSPITAL_COMMUNITY)
Admission: RE | Admit: 2017-09-04 | Discharge: 2017-09-04 | Disposition: A | Discharge status: Home / Self Care | Payer: Medicaid Other | Source: Skilled Nursing Facility | Attending: *Deleted | Admitting: *Deleted

## 2017-09-04 ENCOUNTER — Telehealth: Payer: Self-pay | Admitting: "Endocrinology

## 2017-09-04 ENCOUNTER — Encounter: Payer: Self-pay | Admitting: Internal Medicine

## 2017-09-04 DIAGNOSIS — E108 Type 1 diabetes mellitus with unspecified complications: Secondary | ICD-10-CM

## 2017-09-04 DIAGNOSIS — I480 Paroxysmal atrial fibrillation: Secondary | ICD-10-CM

## 2017-09-04 DIAGNOSIS — I10 Essential (primary) hypertension: Secondary | ICD-10-CM | POA: Diagnosis not present

## 2017-09-04 DIAGNOSIS — I63411 Cerebral infarction due to embolism of right middle cerebral artery: Secondary | ICD-10-CM

## 2017-09-04 DIAGNOSIS — Z931 Gastrostomy status: Secondary | ICD-10-CM

## 2017-09-04 LAB — BASIC METABOLIC PANEL
Anion gap: 9 (ref 5–15)
BUN: 21 mg/dL — AB (ref 6–20)
CALCIUM: 11 mg/dL — AB (ref 8.9–10.3)
CHLORIDE: 107 mmol/L (ref 101–111)
CO2: 24 mmol/L (ref 22–32)
CREATININE: 0.76 mg/dL (ref 0.61–1.24)
GFR calc non Af Amer: >60 mL/min (ref >60)
Glucose, Bld: 109 mg/dL — ABNORMAL HIGH (ref 65–99)
Potassium: 4 mmol/L (ref 3.5–5.1)
SODIUM: 140 mmol/L (ref 135–145)

## 2017-09-04 NOTE — Progress Notes (Signed)
Location:   [place] Room Number: 139/W Place of Service:  SNF [phone]) Provider:  Freddi Starr, MD  Patient Care Team: Virgie Dad, MD as PCP - General (Internal Medicine)  Extended Emergency Contact Information Primary Emergency Contact: Theresia Bough [place] Phone: [phone] Relation: Sister Secondary Emergency Contact: Nickie Retort [place] Mobile Phone: [phone] Relation: Aunt  Code Status:  Full Code Goals of care: Advanced Directive information Advanced Directives 09/04/2017  Does Patient Have a Medical Advance Directive? Yes  Type of Advance Directive (No Data)  Does patient want to make changes to medical advance directive? No - Patient declined  Would patient like information on creating a medical advance directive? No - Patient declined     Chief Complaint  Patient presents with  . Medical Management of Chronic Issues    Routine Visit   For medical management of chronic medical conditions including history of CVA-diabetes type 1-hypertension-atrial fibrillation- hypercalcemia- peripheral neuropathy.   HPI:  Pt is a 57 y.o. male seen today for medical management of chronic diseases.  As noted above.  Patient has a history of the above conditions and was admitted to skilled nursing for therapy possible long-term care after being in the hospital for an extended period last summer in early autumn.  Initially was admitted to the hospital because he was found unresponsive near a bus stop he was found to have a blood sugar over 1100 and was in DKA and shock- he was intubated was on a ventilator and pressors.  While in the ICU he did have an acute CVA and was transferred to [facility].  CT showed a large right MCA infarct with diffuse edema and mass-effect.  He underwent a decompressive right frontal temporal parietal craniotomy for decompression with duraplasty on  April 01, 2017 He also had A. fib that required amiodarone he was eventually started on Eliquis since this was thought to be possibly the reason for his stroke-he is on Lopressor for rate control.  He also required a tracheostomy at one point which is since been removed he also had a PEG tube inserted he continues with this.  He continues to struggle with therapy.  In appetite appears to be spotty still on PEG tube feedings his weight is stable at around 175 pounds.  Patient also has a history of hypercalcemia last calcium was noted to be 11.3 on lab done earlier this month he actually has seen.  Endocrinology today for follow-up today he has no acute complaints he says he does have an occasional cough feels he may be getting a cold.     Past Medical History:  Diagnosis Date  . DM (diabetes mellitus) ([place])   . Hypertension   . Stroke [facility])    Past Surgical History:  Procedure Laterality Date  . CRANIECTOMY Right 04/01/2017   Procedure: RIGHT DECOMPRESSIVE CRANIECTOMY;  Surgeon: Ditty, Kevan Ny, MD;  Location: [place];  Service: Neurosurgery;  Laterality: Right;  . ESOPHAGOGASTRODUODENOSCOPY N/A 04/13/2017   Procedure: ESOPHAGOGASTRODUODENOSCOPY (EGD);  Surgeon: Georganna Skeans, MD;  Location: [place];  Service: General;  Laterality: N/A;  bedside  . PEG PLACEMENT N/A 04/13/2017   Procedure: PERCUTANEOUS ENDOSCOPIC GASTROSTOMY (PEG) PLACEMENT;  Surgeon: Georganna Skeans, MD;  Location: [facility] ENDOSCOPY;  Service: General;  Laterality: N/A;    No Known Allergies  Outpatient Encounter Medications as of 09/04/2017  Medication Sig  . acetaminophen (TYLENOL) 325 MG tablet Place 650 mg  into feeding tube every 4 (four) hours as needed.  . Amino Acids-Protein Hydrolys (FEEDING SUPPLEMENT, PRO-STAT SUGAR FREE 64,) LIQD Take 30 mLs by mouth daily. Per gastric tube  . apixaban (ELIQUIS) 5 MG TABS tablet Place 1 tablet (5 mg total) into feeding tube 2 (two) times daily.  . bisacodyl  (DULCOLAX) 10 MG suppository Place 1 suppository (10 mg total) rectally daily as needed for moderate constipation.  . chlorhexidine (PERIDEX) 0.12 % solution 15 mLs by Mouth Rinse route 2 (two) times daily.  Marland Kitchen docusate (COLACE) 50 MG/5ML liquid Place 10 mLs (100 mg total) into feeding tube 2 (two) times daily as needed for mild constipation.  . docusate (COLACE) 50 MG/5ML liquid Take 100 mg by mouth 2 (two) times daily.  . feeding supplement, ENSURE ENLIVE, (ENSURE ENLIVE) LIQD Take 237 mLs by mouth 2 (two) times daily between meals.  . gabapentin (NEURONTIN) 300 MG capsule Take 1 capsule (300 mg total) by mouth at bedtime.  . insulin detemir (LEVEMIR) 100 UNIT/ML injection Inject 0.34 mLs (34 Units total) into the skin daily.  . metoprolol tartrate (LOPRESSOR) 25 MG tablet Place 0.5 tablets (12.5 mg total) into feeding tube 2 (two) times daily.  . mirtazapine (REMERON) 15 MG tablet Take 7.5 mg by mouth at bedtime.  . Nutritional Supplements (FEEDING SUPPLEMENT, GLUCERNA 1.2 CAL,) LIQD Glucerna 1.2 cal by peg tube Three times a day flush with 30 ml free water before and after administering full volume of TF.  . pantoprazole sodium (PROTONIX) 40 mg/20 mL PACK Place 20 mLs (40 mg total) into feeding tube daily.  . polyethylene glycol (MIRALAX / GLYCOLAX) packet Place 17 g into feeding tube daily.  . [DISCONTINUED] insulin aspart (NOVOLOG FLEXPEN) 100 UNIT/ML FlexPen Inject 10 Units into the skin 3 (three) times daily with meals.   . [DISCONTINUED] traMADol (ULTRAM) 50 MG tablet Take by mouth at bedtime as needed.   No facility-administered encounter medications on file as of 09/04/2017.      Review of Systems   This is limited secondary to cognitive impairment.  Also provided by nursing.  In general does not complain of fever chills.  Skin is not complaining of rashes or itching.  Head ears eyes nose mouth and throat feels he may be having somewhat of a runny nose at times does not complain  of any sore throat or visual changes from baseline.  Respiratory says he has occasionally a cough productive of clear phlegm does not complain of any shortness of breath.  Cardiac does not complain of chest pain or significant lower extremity edema.  GI is not complaining of abdominal discomfort does have a PEG tube continues with somewhat poor appetite.  GU does not complain of dysuria.  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  Neurologic does not complain of dizziness headache has a history CVA with left-sided weakness as well as a history of peripheral neuropathy.  Psych nursing does not report any behavioral issues again does have some cognitive impairment   There is no immunization history on file for this patient. Pertinent  Health Maintenance Due  Topic Date Due  . INFLUENZA VACCINE  09/10/2017 (Originally 03/08/2017)  . OPHTHALMOLOGY EXAM  09/10/2017 (Originally 04/06/1971)  . URINE MICROALBUMIN  09/10/2017 (Originally 04/06/1971)  . COLONOSCOPY  09/10/2017 (Originally 04/06/2011)  . HEMOGLOBIN A1C  01/09/2018  . FOOT EXAM  08/24/2018   Fall Risk  09/04/2017  Falls in the past year? No   Functional Status  Survey:    Vitals:   09/04/17 1257  BP: 138/78  Pulse: (!) 58  Resp: 18  Temp: 98.1 F (36.7 C)  TempSrc: Oral  SpO2: 97%  Weight: 171 lb 9.6 oz (77.8 kg)  Height: 5\' 6"  (1.676 m)  Of note pulse on exam was 90 Body mass index is 27.7 kg/m. Physical Exam In general this is a well-developed middle-aged male in no distress sitting comfortably in his wheelchair.  His skin is warm and dry.  Eyes visual acuity appears grossly intact.  Oropharynx is clear mucous membranes moist.  Nose could not really appreciate any discharge at this point  Chest is clear to auscultation there is no labored breathing.  Heart is largely regular rate and rhythm with an occasional irregular beat he has fairly minimal  edema.  Abdomen is soft nontender with positive bowel sounds PEG site appears unremarkable.  Neurologic continues with left-sided hemiparalysis left upper and lower extremities at baseline- has somewhat slow speech.  Psych he is oriented to self is pleasant appropriate response appropriately to simple questions that are straightforward.     Labs reviewed: Recent Labs    04/09/17 0407  04/11/17 0219  04/12/17 0151  08/03/17 0900 08/10/17 0800 09/04/17 0430  NA 148*   < > 143   < > 139   < > 138 140 140  K 4.1   < > 3.9  --  3.8   < > 4.2 4.2 4.0  CL 115*   < > 111  --  106   < > 104 104 107  CO2 28   < > 27  --  26   < > 25 25 24   GLUCOSE 251*   < > 209*  --  147*   < > 143* 150* 109*  BUN 18   < > 12  --  15   < > 21* 24* 21*  CREATININE 0.86   < > 0.82  --  0.75   < > 0.80 0.84 0.76  CALCIUM 8.3*   < > 8.7*  --  8.5*   < > 11.1* 11.3* 11.0*  MG 2.4  --  2.1  --  2.2  --   --   --   --   PHOS 3.7  --  3.6  --  4.4  --   --   --   --    < > = values in this interval not displayed.   Recent Labs    07/17/17 0330 07/20/17 1100 07/27/17 0704  AST 14* 21 16  ALT 32 33 26  ALKPHOS 59 62 61  BILITOT 0.7 0.6 0.7  PROT 7.5 7.7 7.3  ALBUMIN 3.6 3.8 3.3*   Recent Labs    06/13/17 0700 06/29/17 0930 07/11/17 1025  WBC 11.1* 10.0 11.9*  NEUTROABS 6.9 5.8 7.2  HGB 15.3 13.6 14.2  HCT 50.0 45.9 47.5  MCV 91.1 90.9 90.1  PLT 200 184 232   Lab Results  Component Value Date   TSH 0.913 07/17/2017   Lab Results  Component Value Date   HGBA1C 5.8 (H) 07/11/2017   Lab Results  Component Value Date   CHOL 93 04/02/2017   HDL 28 (L) 04/02/2017   LDLCALC 38 04/02/2017   TRIG 135 04/02/2017   CHOLHDL 3.3 04/02/2017    Significant Diagnostic Results in last 30 days:  No results found.  Assessment/Plan  #1-History of CVA-continues with dense left-sided hemiparalysis therapy has been a challenge continues  to need significant nursing support-he is followed by  neurology- LDL has been less than 60-blood pressure appears to be relatively stable.  -At this point continue supportive care.  2.  History of PEG tube placement he is on Remeron for appetite stimulation appetite still continues to be somewhat of a challenge but his weight is stable he continues with PEG feedings.  3.  History of type 1 diabetes blood sugars largely run in the 80s to low 100s-hemoglobin A1c was 5.8 in December 2018 at this point will monitor he is followed by endocrinology.  4.  Hypertension continues on Lopressor he is also on this for history of A. fib blood pressures appear to be stable most recently 138/78-100/44 in this range.  5.  Atrial fibrillation he is on Lopressor for rate control also on Eliquis for anticoagulation this appears to be rate controlled I see variable pulse is ranging from the high 50s-90s.  6.  Hypercalcemia with calcium on lab done on August 10, 2017 was 11.3 again he is followed by endocrinology and they have ordered labs today including a vitamin D level and PTH.  7.  History of craniotomy he is followed by Dr. Cyndy Freeze-.  8.  History of neuropathy again he is on Neurontin but he does not really complain of that today.  9.  Cold symptoms-I did discuss this with nursing they have not really noted much cough or any drainage-at this point will monitor with vital signs pulse ox every shift for 48 hours does have Robitussin as needed and this will have to be encouraged this will have to be watched lung exam was quite benign  [id number]

## 2017-09-04 NOTE — Telephone Encounter (Signed)
Joe Wilkinson nurse is stating that he in incontinent and it will be impossible to get a 24 hr urine on him, she is asking if that order can be discontinued, please advise?

## 2017-09-04 NOTE — Telephone Encounter (Signed)
Will dc

## 2017-09-04 NOTE — Progress Notes (Signed)
Consult Note                                            09/04/2017, 1:49 PM   Subjective:    Patient ID: Joe Wilkinson, male    DOB: [birth year], PCP Virgie Dad, MD   Past Medical History:  Diagnosis Date  . DM (diabetes mellitus) ([place])   . Hypertension   . Stroke [facility])    Past Surgical History:  Procedure Laterality Date  . CRANIECTOMY Right 04/01/2017   Procedure: RIGHT DECOMPRESSIVE CRANIECTOMY;  Surgeon: Ditty, Kevan Ny, MD;  Location: [place];  Service: Neurosurgery;  Laterality: Right;  . ESOPHAGOGASTRODUODENOSCOPY N/A 04/13/2017   Procedure: ESOPHAGOGASTRODUODENOSCOPY (EGD);  Surgeon: Georganna Skeans, MD;  Location: [place];  Service: General;  Laterality: N/A;  bedside  . PEG PLACEMENT N/A 04/13/2017   Procedure: PERCUTANEOUS ENDOSCOPIC GASTROSTOMY (PEG) PLACEMENT;  Surgeon: Georganna Skeans, MD;  Location: [facility] ENDOSCOPY;  Service: General;  Laterality: N/A;   Social History   Socioeconomic History  . Marital status: Unknown    Spouse name: None  . Number of children: None  . Years of education: None  . Highest education level: None  Social Needs  . Financial resource strain: None  . Food insecurity - worry: None  . Food insecurity - inability: None  . Transportation needs - medical: None  . Transportation needs - non-medical: None  Occupational History  . None  Tobacco Use  . Smoking status: Former Smoker    Types: Cigarettes  . Smokeless tobacco: Never Used  . Tobacco comment: UTA  Substance and Sexual Activity  . Alcohol use: No    Comment: UTA  . Drug use: No    Comment: UTA  . Sexual activity: Not Currently    Birth control/protection: None  Other Topics Concern  . None  Social History Narrative  . None   Outpatient Encounter Medications as of 09/04/2017  Medication Sig  . [DISCONTINUED] insulin aspart (NOVOLOG FLEXPEN) 100 UNIT/ML FlexPen Inject 10 Units into the skin 3 (three) times daily with meals.   . [DISCONTINUED] traMADol  (ULTRAM) 50 MG tablet Take by mouth at bedtime as needed.  Marland Kitchen acetaminophen (TYLENOL) 325 MG tablet Place 650 mg into feeding tube every 4 (four) hours as needed.  . Amino Acids-Protein Hydrolys (FEEDING SUPPLEMENT, PRO-STAT SUGAR FREE 64,) LIQD Take 30 mLs by mouth daily. Per gastric tube  . apixaban (ELIQUIS) 5 MG TABS tablet Place 1 tablet (5 mg total) into feeding tube 2 (two) times daily.  . bisacodyl (DULCOLAX) 10 MG suppository Place 1 suppository (10 mg total) rectally daily as needed for moderate constipation.  . chlorhexidine (PERIDEX) 0.12 % solution 15 mLs by Mouth Rinse route 2 (two) times daily.  Marland Kitchen docusate (COLACE) 50 MG/5ML liquid Place 10 mLs (100 mg total) into feeding tube 2 (two) times daily as needed for mild constipation.  . docusate (COLACE) 50 MG/5ML liquid Take 100 mg by mouth 2 (two) times daily.  . feeding supplement, ENSURE ENLIVE, (ENSURE ENLIVE) LIQD Take 237 mLs by mouth 2 (two) times daily between meals.  . gabapentin (NEURONTIN) 300 MG capsule Take 1 capsule (300 mg total) by mouth at bedtime.  . insulin detemir (LEVEMIR) 100 UNIT/ML injection Inject 0.34 mLs (34 Units total) into the skin daily.  . metoprolol tartrate (LOPRESSOR) 25 MG tablet Place 0.5  tablets (12.5 mg total) into feeding tube 2 (two) times daily.  . mirtazapine (REMERON) 15 MG tablet Take 7.5 mg by mouth at bedtime.  . Nutritional Supplements (FEEDING SUPPLEMENT, GLUCERNA 1.2 CAL,) LIQD Glucerna 1.2 cal by peg tube Three times a day flush with 30 ml free water before and after administering full volume of TF.  . pantoprazole sodium (PROTONIX) 40 mg/20 mL PACK Place 20 mLs (40 mg total) into feeding tube daily.  . polyethylene glycol (MIRALAX / GLYCOLAX) packet Place 17 g into feeding tube daily.   No facility-administered encounter medications on file as of 09/04/2017.    ALLERGIES: No Known Allergies  VACCINATION STATUS:  There is no immunization history on file for this  patient.  HPI Joe Wilkinson is 57 y.o. male who presents today with a medical history as above. he is being seen in consultation for hypercalcemia requested by Virgie Dad, MD. 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 - He is a poor historian. He is accompanying nursing home worker does not know the details of his medical history. He was found to have mild to moderate hypercalcemia from September 2018. -There is no documented history of prior parathyroid, thyroid, pituitary, pancreatitis dysfunction. No reported history of malignancy. - He has diabetes on insulin. Review of Systems  Constitutional: Reports steady weight, no fatigue, no subjective hyperthermia, no subjective hypothermia Eyes: no blurry vision, no xerophthalmia ENT: no sore throat, no nodules palpated in throat, no dysphagia/odynophagia, no hoarseness Cardiovascular: no Chest Pain, no Shortness of Breath, no palpitations, no leg swelling Respiratory: no cough, no SOB Gastrointestinal: no Nausea/Vomiting/Diarhhea Musculoskeletal:  Contracted left upper and lower extremities.  Skin: no rashes Neurological: per his caretaker he is continent of bladder and rectum. Psychiatric: no depression, no anxiety  Objective:    BP 113/72   Pulse 69   Wt Readings from Last 3 Encounters:  09/04/17 171 lb 9.6 oz (77.8 kg)  08/30/17 171 lb 9.6 oz (77.8 kg)  07/10/17 176 lb (79.8 kg)    Physical Exam  Constitutional:  wheelchair-bound with left hemiparesis.  Eyes: PERRLA, EOMI, no exophthalmos ENT: moist mucous membranes, no thyromegaly, no cervical lymphadenopathy, significant skull deficit on right frontoparietal region.  Cardiovascular: normal precordial activity, Regular Rate and Rhythm, no Murmur/Rubs/Gallops Respiratory:  adequate breathing efforts, no gross chest deformity, Clear to auscultation bilaterally Gastrointestinal: abdomen soft, Non -tender, No  distension, Bowel Sounds present Musculoskeletal:   contracted left upper and lower extremities. Skin: moist, warm, no rashes Neurological:  motor power 0/5 on left side, 3-4 out of 5 on the right side.   CMP ( most recent) CMP     Component Value Date/Time   NA 140 09/04/2017 0430   K 4.0 09/04/2017 0430   CL 107 09/04/2017 0430   CO2 24 09/04/2017 0430   GLUCOSE 109 (H) 09/04/2017 0430   BUN 21 (H) 09/04/2017 0430   CREATININE 0.76 09/04/2017 0430   CALCIUM 11.0 (H) 09/04/2017 0430   PROT 7.3 07/27/2017 0704   ALBUMIN 3.3 (L) 07/27/2017 0704   AST 16 07/27/2017 0704   ALT 26 07/27/2017 0704   ALKPHOS 61 07/27/2017 0704   BILITOT 0.7 07/27/2017 0704   GFRNONAA >60 09/04/2017 0430   GFRAA >60 09/04/2017 0430   PTH from 07/11/2017 was 22, associated with calcium of 12.6.  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 - I have reviewed his available records records and clinically evaluated the patient.  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  - This puts him at risk for osteoporosis, complications  of hypercalcemia including cardiac dysrhythmias, nephrolithiasis, etc. he will need definitive treatment. -Since this is mainly a diagnosis of exclusion, I would like to obtain some basic  endocrine workup to rule out other causes of hypercalcemia including primary hyperparathyroidism and malignancy. He'll be sent for lab to obtain repeat PTH/calcium, serum magnesium, serum phosphorous, serum 25 hydroxy vitamin D, and 24-hour urine calcium and creatinine. - If his workup points towards immobilization hypercalcemia, he would be considered for  zoledronic acid 5 mg IV infusion. - He will be reassessed with his labs in a week.  - I did not initiate any new prescriptions today.  - Time spent with the patient: 45 minutes, of which >50% was spent in obtaining information about his symptoms, reviewing his previous labs, evaluations, and treatments, and counseling him on immobilization hypercalcemia.   - I advised patient to maintain close follow up with Virgie Dad, MD for primary care needs. Follow up plan: Return in about 1 week (around 09/11/2017) for follow up with pre-visit labs.   Glade Lloyd, MD [facility] [address] [place], [place] [address] Phone: [phone]  Fax: [phone]     09/04/2017, 1:49 PM  This note was partially dictated with voice recognition software. Similar sounding words can be transcribed inadequately or may not  be corrected upon review.

## 2017-09-04 NOTE — Telephone Encounter (Signed)
Renee at the [facility] is aware

## 2017-09-05 ENCOUNTER — Encounter (HOSPITAL_COMMUNITY)
Admission: RE | Admit: 2017-09-05 | Discharge: 2017-09-05 | Disposition: A | Discharge status: Home / Self Care | Payer: Medicaid Other | Source: Skilled Nursing Facility | Attending: Internal Medicine | Admitting: Internal Medicine

## 2017-09-05 ENCOUNTER — Encounter: Payer: Self-pay | Admitting: "Endocrinology

## 2017-09-05 DIAGNOSIS — I63311 Cerebral infarction due to thrombosis of right middle cerebral artery: Secondary | ICD-10-CM | POA: Insufficient documentation

## 2017-09-05 DIAGNOSIS — Z5189 Encounter for other specified aftercare: Secondary | ICD-10-CM | POA: Insufficient documentation

## 2017-09-05 DIAGNOSIS — E1165 Type 2 diabetes mellitus with hyperglycemia: Secondary | ICD-10-CM | POA: Insufficient documentation

## 2017-09-05 DIAGNOSIS — D6489 Other specified anemias: Secondary | ICD-10-CM | POA: Insufficient documentation

## 2017-09-05 LAB — MAGNESIUM: MAGNESIUM: 2.1 mg/dL (ref 1.7–2.4)

## 2017-09-05 LAB — PHOSPHORUS: PHOSPHORUS: 3.7 mg/dL (ref 2.5–4.6)

## 2017-09-06 LAB — PTH, INTACT AND CALCIUM
CALCIUM TOTAL (PTH): 11.5 mg/dL — AB (ref 8.7–10.2)
PTH: 11 pg/mL — AB (ref 15–65)

## 2017-09-06 LAB — VITAMIN D 25 HYDROXY (VIT D DEFICIENCY, FRACTURES): Vit D, 25-Hydroxy: 19 ng/mL — ABNORMAL LOW (ref 30.0–100.0)

## 2017-09-09 LAB — PTH-RELATED PEPTIDE: PTH-related peptide: <2 pmol/L

## 2017-09-13 ENCOUNTER — Encounter: Payer: Self-pay | Admitting: "Endocrinology

## 2017-09-13 ENCOUNTER — Ambulatory Visit (INDEPENDENT_AMBULATORY_CARE_PROVIDER_SITE_OTHER): Payer: Medicaid Other | Admitting: "Endocrinology

## 2017-09-13 DIAGNOSIS — E559 Vitamin D deficiency, unspecified: Secondary | ICD-10-CM | POA: Diagnosis not present

## 2017-09-13 MED ORDER — VITAMIN D3 125 MCG (5000 UT) PO CAPS: 5000.0000 [IU] | ORAL_CAPSULE | Freq: Every day | ORAL | 0 refills | Status: DC

## 2017-09-13 NOTE — Progress Notes (Signed)
Consult Note                                            09/13/2017, 2:23 PM   Subjective:    Patient ID: Joe Wilkinson, male    DOB: [birth year], PCP Joe Dad, MD   Past Medical History:  Diagnosis Date  . DM (diabetes mellitus) ([place])   . Hypertension   . Stroke [facility])    Past Surgical History:  Procedure Laterality Date  . CRANIECTOMY Right 04/01/2017   Procedure: RIGHT DECOMPRESSIVE CRANIECTOMY;  Surgeon: Ditty, Kevan Ny, MD;  Location: [facility];  Service: Neurosurgery;  Laterality: Right;  . ESOPHAGOGASTRODUODENOSCOPY N/A 04/13/2017   Procedure: ESOPHAGOGASTRODUODENOSCOPY (EGD);  Surgeon: Georganna Skeans, MD;  Location: [facility];  Service: General;  Laterality: N/A;  bedside  . PEG PLACEMENT N/A 04/13/2017   Procedure: PERCUTANEOUS ENDOSCOPIC GASTROSTOMY (PEG) PLACEMENT;  Surgeon: Georganna Skeans, MD;  Location: [facility] ENDOSCOPY;  Service: General;  Laterality: N/A;   Social History   Socioeconomic History  . Marital status: Unknown    Spouse name: None  . Number of children: None  . Years of education: None  . Highest education level: None  Social Needs  . Financial resource strain: None  . Food insecurity - worry: None  . Food insecurity - inability: None  . Transportation needs - medical: None  . Transportation needs - non-medical: None  Occupational History  . None  Tobacco Use  . Smoking status: Former Smoker    Types: Cigarettes  . Smokeless tobacco: Never Used  . Tobacco comment: UTA  Substance and Sexual Activity  . Alcohol use: No    Comment: UTA  . Drug use: No    Comment: UTA  . Sexual activity: Not Currently    Birth control/protection: None  Other Topics Concern  . None  Social History Narrative  . None   Outpatient Encounter Medications as of 09/13/2017  Medication Sig  . acetaminophen (TYLENOL) 325 MG tablet Place 650 mg into feeding tube every 4 (four) hours as needed.  . Amino Acids-Protein Hydrolys (FEEDING SUPPLEMENT,  PRO-STAT SUGAR FREE 64,) LIQD Take 30 mLs by mouth daily. Per gastric tube  . apixaban (ELIQUIS) 5 MG TABS tablet Place 1 tablet (5 mg total) into feeding tube 2 (two) times daily.  . bisacodyl (DULCOLAX) 10 MG suppository Place 1 suppository (10 mg total) rectally daily as needed for moderate constipation.  . chlorhexidine (PERIDEX) 0.12 % solution 15 mLs by Mouth Rinse route 2 (two) times daily.  . Cholecalciferol (VITAMIN D3) 5000 units CAPS Take 1 capsule (5,000 Units total) by mouth daily.  Marland Kitchen docusate (COLACE) 50 MG/5ML liquid Place 10 mLs (100 mg total) into feeding tube 2 (two) times daily as needed for mild constipation.  . docusate (COLACE) 50 MG/5ML liquid Take 100 mg by mouth 2 (two) times daily.  . feeding supplement, ENSURE ENLIVE, (ENSURE ENLIVE) LIQD Take 237 mLs by mouth 2 (two) times daily between meals.  . gabapentin (NEURONTIN) 300 MG capsule Take 1 capsule (300 mg total) by mouth at bedtime.  . insulin detemir (LEVEMIR) 100 UNIT/ML injection Inject 0.34 mLs (34 Units total) into the skin daily.  . metoprolol tartrate (LOPRESSOR) 25 MG tablet Place 0.5 tablets (12.5 mg total) into feeding tube 2 (two) times daily.  . mirtazapine (REMERON) 15 MG tablet Take 7.5 mg  by mouth at bedtime.  . Nutritional Supplements (FEEDING SUPPLEMENT, GLUCERNA 1.2 CAL,) LIQD Glucerna 1.2 cal by peg tube Three times a day flush with 30 ml free water before and after administering full volume of TF.  . pantoprazole sodium (PROTONIX) 40 mg/20 mL PACK Place 20 mLs (40 mg total) into feeding tube daily.  . polyethylene glycol (MIRALAX / GLYCOLAX) packet Place 17 g into feeding tube daily.   No facility-administered encounter medications on file as of 09/13/2017.    ALLERGIES: No Known Allergies  VACCINATION STATUS:  There is no immunization history on file for this patient.  HPI Joe Wilkinson is 57 y.o. male who presents today with a medical history as above. he is being seen in follow-up for  hypercalcemia.  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 - He is a poor historian. He is accompanying nursing home worker does not know the details of his medical history. He was found to have mild to moderate hypercalcemia from September 2018. -There is no documented history of prior parathyroid, thyroid, pituitary, pancreatitis dysfunction. No reported history of malignancy. -After his last visit repeat labs show calcium of 11.5, associated with low PTH of 11.  He has associated vitamin D deficiency.  - He has diabetes on insulin. Review of Systems  Constitutional: Reports steady weight, no fatigue, no subjective hyperthermia, no subjective hypothermia Eyes: no blurry vision, no xerophthalmia ENT: no sore throat, no nodules palpated in throat, no dysphagia/odynophagia, no hoarseness Cardiovascular: no Chest Pain, no Shortness of Breath, no palpitations, no leg swelling Respiratory: no cough, no SOB Gastrointestinal: no Nausea/Vomiting/Diarhhea Musculoskeletal:  Contracted left upper and lower extremities.  Skin: no rashes Neurological: He is reported to be incontinent of bladder.   psychiatric: no depression, no anxiety  Objective:    BP 127/87   Pulse 79   Wt Readings from Last 3 Encounters:  09/04/17 171 lb 9.6 oz (77.8 kg)  08/30/17 171 lb 9.6 oz (77.8 kg)  07/10/17 176 lb (79.8 kg)    Physical Exam  Constitutional:  wheelchair-bound with left hemiparesis.  Eyes: PERRLA, EOMI, no exophthalmos ENT: moist mucous membranes, no thyromegaly, no cervical lymphadenopathy, significant skull deficit on right frontoparietal region.  Cardiovascular: normal precordial activity, Regular Rate and Rhythm, no Murmur/Rubs/Gallops Respiratory:  adequate breathing efforts, no gross chest deformity, Clear to auscultation bilaterally Gastrointestinal: abdomen soft, Non -tender, No distension, Bowel Sounds  present Musculoskeletal:   contracted left upper and lower extremities. Skin: moist, warm, no rashes Neurological:  motor power 0/5 on left side, 3-4 out of 5 on the right side.   CMP ( most recent) CMP     Component Value Date/Time   NA 140 09/04/2017 0430   K 4.0 09/04/2017 0430   CL 107 09/04/2017 0430   CO2 24 09/04/2017 0430   GLUCOSE 109 (H) 09/04/2017 0430   BUN 21 (H) 09/04/2017 0430   CREATININE 0.76 09/04/2017 0430   CALCIUM 11.5 (H) 09/05/2017 0709   PROT 7.3 07/27/2017 0704   ALBUMIN 3.3 (L) 07/27/2017 0704   AST 16 07/27/2017 0704   ALT 26 07/27/2017 0704   ALKPHOS 61 07/27/2017 0704   BILITOT 0.7 07/27/2017 0704   GFRNONAA >60 09/04/2017 0430   GFRAA >60 09/04/2017 0430   PTH from 07/11/2017 was 22, associated with calcium of 12.6.  Diabetic Labs (most recent): Lab Results  Component Value Date   HGBA1C 5.8 (H) 07/11/2017   HGBA1C 14.3 (H)  04/02/2017     Lipid Panel ( most recent) Lipid Panel     Component Value Date/Time   CHOL 93 04/02/2017 0607   TRIG 135 04/02/2017 0607   HDL 28 (L) 04/02/2017 0607   CHOLHDL 3.3 04/02/2017 0607   VLDL 27 04/02/2017 0607   LDLCALC 38 04/02/2017 0607      Lab Results  Component Value Date   TSH 0.913 07/17/2017    Recent Results (from the past 2160 hour(s))  Basic metabolic panel     Status: Abnormal   Collection Time: 06/29/17  9:30 AM  Result Value Ref Range   Sodium 136 135 - 145 mmol/L   Potassium 4.0 3.5 - 5.1 mmol/L   Chloride 101 101 - 111 mmol/L   CO2 29 22 - 32 mmol/L   Glucose, Bld 106 (H) 65 - 99 mg/dL   BUN 23 (H) 6 - 20 mg/dL   Creatinine, Ser 0.77 0.61 - 1.24 mg/dL   Calcium 11.6 (H) 8.9 - 10.3 mg/dL   GFR calc non Af Amer >60 >60 mL/min   GFR calc Af Amer >60 >60 mL/min    Comment: (NOTE) The eGFR has been calculated using the CKD EPI equation. This calculation has not been validated in all clinical situations. eGFR's persistently <60 mL/min signify possible Chronic Kidney Disease.     Anion gap 6 5 - 15  CBC with Differential/Platelet     Status: Abnormal   Collection Time: 06/29/17  9:30 AM  Result Value Ref Range   WBC 10.0 4.0 - 10.5 K/uL   RBC 5.05 4.22 - 5.81 MIL/uL   Hemoglobin 13.6 13.0 - 17.0 g/dL   HCT 45.9 39.0 - 52.0 %   MCV 90.9 78.0 - 100.0 fL   MCH 26.9 26.0 - 34.0 pg   MCHC 29.6 (L) 30.0 - 36.0 g/dL   RDW 15.4 11.5 - 15.5 %   Platelets 184 150 - 400 K/uL    Comment: SPECIMEN CHECKED FOR CLOTS PLATELET COUNT CONFIRMED BY SMEAR    Neutrophils Relative % 57 %   Neutro Abs 5.8 1.7 - 7.7 K/uL   Lymphocytes Relative 31 %   Lymphs Abs 3.1 0.7 - 4.0 K/uL   Monocytes Relative 7 %   Monocytes Absolute 0.7 0.1 - 1.0 K/uL   Eosinophils Relative 4 %   Eosinophils Absolute 0.4 0.0 - 0.7 K/uL   Basophils Relative 1 %   Basophils Absolute 0.1 0.0 - 0.1 K/uL  CBC with Differential/Platelet     Status: Abnormal   Collection Time: 07/11/17 10:25 AM  Result Value Ref Range   WBC 11.9 (H) 4.0 - 10.5 K/uL   RBC 5.27 4.22 - 5.81 MIL/uL   Hemoglobin 14.2 13.0 - 17.0 g/dL   HCT 47.5 39.0 - 52.0 %   MCV 90.1 78.0 - 100.0 fL   MCH 26.9 26.0 - 34.0 pg   MCHC 29.9 (L) 30.0 - 36.0 g/dL   RDW 15.5 11.5 - 15.5 %   Platelets 232 150 - 400 K/uL    Comment: PLATELET COUNT CONFIRMED BY SMEAR   Neutrophils Relative % 59 %   Neutro Abs 7.2 1.7 - 7.7 K/uL   Lymphocytes Relative 30 %   Lymphs Abs 3.5 0.7 - 4.0 K/uL   Monocytes Relative 7 %   Monocytes Absolute 0.8 0.1 - 1.0 K/uL   Eosinophils Relative 3 %   Eosinophils Absolute 0.3 0.0 - 0.7 K/uL   Basophils Relative 1 %   Basophils Absolute 0.1  0.0 - 0.1 K/uL  Basic metabolic panel     Status: Abnormal   Collection Time: 07/11/17 10:25 AM  Result Value Ref Range   Sodium 140 135 - 145 mmol/L   Potassium 4.4 3.5 - 5.1 mmol/L   Chloride 106 101 - 111 mmol/L   CO2 27 22 - 32 mmol/L   Glucose, Bld 123 (H) 65 - 99 mg/dL   BUN 24 (H) 6 - 20 mg/dL   Creatinine, Ser 0.76 0.61 - 1.24 mg/dL   Calcium 12.6 (H) 8.9 -  10.3 mg/dL   GFR calc non Af Amer >60 >60 mL/min   GFR calc Af Amer >60 >60 mL/min    Comment: (NOTE) The eGFR has been calculated using the CKD EPI equation. This calculation has not been validated in all clinical situations. eGFR's persistently <60 mL/min signify possible Chronic Kidney Disease.    Anion gap 7 5 - 15  Hemoglobin A1c     Status: Abnormal   Collection Time: 07/11/17 10:25 AM  Result Value Ref Range   Hgb A1c MFr Bld 5.8 (H) 4.8 - 5.6 %    Comment: (NOTE) Pre diabetes:          5.7%-6.4% Diabetes:              >6.4% Glycemic control for   <7.0% adults with diabetes    Mean Plasma Glucose 119.76 mg/dL    Comment: Performed at [facility] [address], [place], [place] [address]  Parathyroid hormone, intact (no Ca)     Status: None   Collection Time: 07/11/17 10:25 AM  Result Value Ref Range   PTH 22 15 - 65 pg/mL    Comment: (NOTE) Performed At: [facility] [place], [place] [id number] Rush Farmer MD SL:[id number]   Comprehensive metabolic panel     Status: Abnormal   Collection Time: 07/13/17  7:19 AM  Result Value Ref Range   Sodium 138 135 - 145 mmol/L   Potassium 4.1 3.5 - 5.1 mmol/L   Chloride 104 101 - 111 mmol/L   CO2 26 22 - 32 mmol/L   Glucose, Bld 161 (H) 65 - 99 mg/dL   BUN 28 (H) 6 - 20 mg/dL   Creatinine, Ser 0.80 0.61 - 1.24 mg/dL   Calcium 12.1 (H) 8.9 - 10.3 mg/dL   Total Protein 7.5 6.5 - 8.1 g/dL   Albumin 3.5 3.5 - 5.0 g/dL   AST 18 15 - 41 U/L   ALT 47 17 - 63 U/L   Alkaline Phosphatase 66 38 - 126 U/L   Total Bilirubin 0.5 0.3 - 1.2 mg/dL   GFR calc non Af Amer >60 >60 mL/min   GFR calc Af Amer >60 >60 mL/min    Comment: (NOTE) The eGFR has been calculated using the CKD EPI equation. This calculation has not been validated in all clinical situations. eGFR's persistently <60 mL/min signify possible Chronic Kidney Disease.    Anion gap 8 5 - 15  TSH     Status: None   Collection Time:  07/17/17  3:30 AM  Result Value Ref Range   TSH 0.913 0.350 - 4.500 uIU/mL    Comment: Performed by a 3rd Generation assay with a functional sensitivity of <=0.01 uIU/mL.  Comprehensive metabolic panel     Status: Abnormal   Collection Time: 07/17/17  3:30 AM  Result Value Ref Range   Sodium 138 135 - 145 mmol/L   Potassium 4.2 3.5 - 5.1  mmol/L   Chloride 104 101 - 111 mmol/L   CO2 27 22 - 32 mmol/L   Glucose, Bld 147 (H) 65 - 99 mg/dL   BUN 23 (H) 6 - 20 mg/dL   Creatinine, Ser 0.72 0.61 - 1.24 mg/dL   Calcium 11.9 (H) 8.9 - 10.3 mg/dL   Total Protein 7.5 6.5 - 8.1 g/dL   Albumin 3.6 3.5 - 5.0 g/dL   AST 14 (L) 15 - 41 U/L   ALT 32 17 - 63 U/L   Alkaline Phosphatase 59 38 - 126 U/L   Total Bilirubin 0.7 0.3 - 1.2 mg/dL   GFR calc non Af Amer >60 >60 mL/min   GFR calc Af Amer >60 >60 mL/min    Comment: (NOTE) The eGFR has been calculated using the CKD EPI equation. This calculation has not been validated in all clinical situations. eGFR's persistently <60 mL/min signify possible Chronic Kidney Disease.    Anion gap 7 5 - 15  VITAMIN D 25 Hydroxy (Vit-D Deficiency, Fractures)     Status: Abnormal   Collection Time: 07/17/17  3:30 AM  Result Value Ref Range   Vit D, 25-Hydroxy 17.5 (L) 30.0 - 100.0 ng/mL    Comment: (NOTE) Vitamin D deficiency has been defined by the Institute of Medicine and an Endocrine Society practice guideline as a level of serum 25-OH vitamin D less than 20 ng/mL (1,2). The Endocrine Society went on to further define vitamin D insufficiency as a level between 21 and 29 ng/mL (2). 1. IOM (Institute of Medicine). 2010. Dietary reference   intakes for calcium and D. [place]: The   [employer]. 2. Holick MF, Binkley Vantage, Bischoff-Ferrari HA, et al.   Evaluation, treatment, and prevention of vitamin D   deficiency: an Endocrine Society clinical practice   guideline. JCEM. 2011 Jul; 96(7):1911-30. Performed At: [facility] [place], [place] [id number] Rush Farmer MD WU:[id number]   Comprehensive metabolic panel     Status: Abnormal   Collection Time: 07/20/17 11:00 AM  Result Value Ref Range   Sodium 138 135 - 145 mmol/L   Potassium 3.9 3.5 - 5.1 mmol/L   Chloride 105 101 - 111 mmol/L   CO2 24 22 - 32 mmol/L   Glucose, Bld 178 (H) 65 - 99 mg/dL   BUN 25 (H) 6 - 20 mg/dL   Creatinine, Ser 0.77 0.61 - 1.24 mg/dL   Calcium 11.7 (H) 8.9 - 10.3 mg/dL   Total Protein 7.7 6.5 - 8.1 g/dL   Albumin 3.8 3.5 - 5.0 g/dL   AST 21 15 - 41 U/L   ALT 33 17 - 63 U/L   Alkaline Phosphatase 62 38 - 126 U/L   Total Bilirubin 0.6 0.3 - 1.2 mg/dL   GFR calc non Af Amer >60 >60 mL/min   GFR calc Af Amer >60 >60 mL/min    Comment: (NOTE) The eGFR has been calculated using the CKD EPI equation. This calculation has not been validated in all clinical situations. eGFR's persistently <60 mL/min signify possible Chronic Kidney Disease.    Anion gap 9 5 - 15  Comprehensive metabolic panel     Status: Abnormal   Collection Time: 07/27/17  7:04 AM  Result Value Ref Range   Sodium 138 135 - 145 mmol/L   Potassium 3.8 3.5 - 5.1 mmol/L   Chloride 104 101 - 111 mmol/L   CO2 24 22 - 32 mmol/L   Glucose, Bld 158 (H) 65 -  99 mg/dL   BUN 19 6 - 20 mg/dL   Creatinine, Ser 0.76 0.61 - 1.24 mg/dL   Calcium 11.0 (H) 8.9 - 10.3 mg/dL   Total Protein 7.3 6.5 - 8.1 g/dL   Albumin 3.3 (L) 3.5 - 5.0 g/dL   AST 16 15 - 41 U/L   ALT 26 17 - 63 U/L   Alkaline Phosphatase 61 38 - 126 U/L   Total Bilirubin 0.7 0.3 - 1.2 mg/dL   GFR calc non Af Amer >60 >60 mL/min   GFR calc Af Amer >60 >60 mL/min    Comment: (NOTE) The eGFR has been calculated using the CKD EPI equation. This calculation has not been validated in all clinical situations. eGFR's persistently <60 mL/min signify possible Chronic Kidney Disease.    Anion gap 10 5 - 15  Basic metabolic panel     Status: Abnormal   Collection Time: 08/03/17   9:00 AM  Result Value Ref Range   Sodium 138 135 - 145 mmol/L   Potassium 4.2 3.5 - 5.1 mmol/L   Chloride 104 101 - 111 mmol/L   CO2 25 22 - 32 mmol/L   Glucose, Bld 143 (H) 65 - 99 mg/dL   BUN 21 (H) 6 - 20 mg/dL   Creatinine, Ser 0.80 0.61 - 1.24 mg/dL   Calcium 11.1 (H) 8.9 - 10.3 mg/dL   GFR calc non Af Amer >60 >60 mL/min   GFR calc Af Amer >60 >60 mL/min    Comment: (NOTE) The eGFR has been calculated using the CKD EPI equation. This calculation has not been validated in all clinical situations. eGFR's persistently <60 mL/min signify possible Chronic Kidney Disease.    Anion gap 9 5 - 15  Basic metabolic panel     Status: Abnormal   Collection Time: 08/10/17  8:00 AM  Result Value Ref Range   Sodium 140 135 - 145 mmol/L   Potassium 4.2 3.5 - 5.1 mmol/L   Chloride 104 101 - 111 mmol/L   CO2 25 22 - 32 mmol/L   Glucose, Bld 150 (H) 65 - 99 mg/dL   BUN 24 (H) 6 - 20 mg/dL   Creatinine, Ser 0.84 0.61 - 1.24 mg/dL   Calcium 11.3 (H) 8.9 - 10.3 mg/dL   GFR calc non Af Amer >60 >60 mL/min   GFR calc Af Amer >60 >60 mL/min    Comment: (NOTE) The eGFR has been calculated using the CKD EPI equation. This calculation has not been validated in all clinical situations. eGFR's persistently <60 mL/min signify possible Chronic Kidney Disease.    Anion gap 11 5 - 15  Basic metabolic panel     Status: Abnormal   Collection Time: 09/04/17  4:30 AM  Result Value Ref Range   Sodium 140 135 - 145 mmol/L   Potassium 4.0 3.5 - 5.1 mmol/L   Chloride 107 101 - 111 mmol/L   CO2 24 22 - 32 mmol/L   Glucose, Bld 109 (H) 65 - 99 mg/dL   BUN 21 (H) 6 - 20 mg/dL   Creatinine, Ser 0.76 0.61 - 1.24 mg/dL   Calcium 11.0 (H) 8.9 - 10.3 mg/dL   GFR calc non Af Amer >60 >60 mL/min   GFR calc Af Amer >60 >60 mL/min    Comment: (NOTE) The eGFR has been calculated using the CKD EPI equation. This calculation has not been validated in all clinical situations. eGFR's persistently <60 mL/min signify  possible Chronic Kidney Disease.  Anion gap 9 5 - 15  PTH-related peptide     Status: None   Collection Time: 09/05/17  7:00 AM  Result Value Ref Range   PTH-related peptide <2.0 pmol/L    Comment: (NOTE) Reference Range: All Ages: <2.0 The PTHrP assay should not be used to exclude cancer or screen tumor patients for humoral hypercalcemia of malignancy (HHM). The results should always be assessed in conjunction with the patient's medical history, clinical examination, and other findings. If test results are clinically discordant, please contact the laboratory. Performed At: [facility] [place], [place] [id number] Pepkowitz Sheral Apley MD WU:[id number] Performed at [facility], [address], [place], [place] [address]   Phosphorus     Status: None   Collection Time: 09/05/17  7:09 AM  Result Value Ref Range   Phosphorus 3.7 2.5 - 4.6 mg/dL  Magnesium     Status: None   Collection Time: 09/05/17  7:09 AM  Result Value Ref Range   Magnesium 2.1 1.7 - 2.4 mg/dL  PTH, intact and calcium     Status: Abnormal   Collection Time: 09/05/17  7:09 AM  Result Value Ref Range   PTH 11 (L) 15 - 65 pg/mL   Calcium, Total (PTH) 11.5 (H) 8.7 - 10.2 mg/dL   PTH Interp Comment     Comment: (NOTE) Interpretation                 Intact PTH    Calcium                                (pg/mL)      (mg/dL) Normal                          15 - 65     8.6 - 10.2 Primary Hyperparathyroidism         >65          >10.2 Secondary Hyperparathyroidism       >65          <10.2 Non-Parathyroid Hypercalcemia       <65          >10.2 Hypoparathyroidism                  <15          < 8.6 Non-Parathyroid Hypocalcemia    15 - 65          < 8.6 Performed At: [facility] [place], [place] [id number] Rush Farmer MD TA:[id number]   VITAMIN D 25 Hydroxy (Vit-D Deficiency, Fractures)     Status: Abnormal   Collection Time: 09/05/17  7:09 AM  Result Value Ref  Range   Vit D, 25-Hydroxy 19.0 (L) 30.0 - 100.0 ng/mL    Comment: (NOTE) Vitamin D deficiency has been defined by the Institute of Medicine and an Endocrine Society practice guideline as a level of serum 25-OH vitamin D less than 20 ng/mL (1,2). The Endocrine Society went on to further define vitamin D insufficiency as a level between 21 and 29 ng/mL (2). 1. IOM (Institute of Medicine). 2010. Dietary reference   intakes for calcium and D. [place]: The   [employer]. 2. Holick MF, Binkley Crozier, Bischoff-Ferrari HA, et al.   Evaluation, treatment, and prevention of vitamin D   deficiency: an Endocrine Society clinical practice  guideline. JCEM. 2011 Jul; 96(7):1911-30. Performed At: [facility] [place], [place] [id number] Rush Farmer MD DY:[id number]    Since his last visit.  Labs show PTH at 11 low normal associated with hypercalcemia of 11.5, PTH related peptide less than 2, phosphorus 3.7, magnesium 2.1.  Vitamin D deficiency of 19.    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  -His subsequent workup supports this differential diagnosis more than any alternative diagnosis.   -He could not perform 24-hour urine collection due to incontinence. - he will  be considered for  zoledronic acid 5 mg IV infusion, and [facility] infusion center. -I will also initiate vitamin D supplement, with vitamin D3 5000 units daily.  - I advised patient to maintain close follow up with Joe Dad, MD for primary care needs. Follow up plan: Return in about 3 weeks (around 10/04/2017) for refer her for Reclast Infusion 31m IV per infusion protocol., refer her for Reclast Infusion.   GGlade Lloyd MD [facility] [address]Phone: [phone] Fax: [phone]    09/13/2017, 2:23 PM  This note was partially dictated with voice recognition software. Similar sounding words can be transcribed inadequately or may not  be corrected upon review.

## 2017-09-22 ENCOUNTER — Encounter (HOSPITAL_COMMUNITY)
Admit: 2017-09-22 | Discharge: 2017-09-22 | Disposition: A | Discharge status: Home / Self Care | Payer: Medicaid Other | Attending: "Endocrinology | Admitting: "Endocrinology

## 2017-09-22 MED ORDER — ZOLEDRONIC ACID 5 MG/100ML IV SOLN
5.0000 mg | Freq: Once | INTRAVENOUS | Status: AC
  Administered 2017-09-22: 5 mg via INTRAVENOUS

## 2017-09-22 MED ORDER — SODIUM CHLORIDE 0.9 % IV SOLN
INTRAVENOUS | Status: DC
  Administered 2017-09-22: 10:00:00 via INTRAVENOUS

## 2017-09-22 MED ORDER — ZOLEDRONIC ACID 5 MG/100ML IV SOLN
INTRAVENOUS | Status: AC
  Filled 2017-09-22: qty 100

## 2017-09-24 ENCOUNTER — Emergency Department (HOSPITAL_COMMUNITY): Payer: Medicaid Other

## 2017-09-24 ENCOUNTER — Inpatient Hospital Stay (HOSPITAL_COMMUNITY)
Admission: EM | Admit: 2017-09-24 | Discharge: 2017-09-30 | DRG: 853 | Disposition: A | Discharge status: Skilled Nursing Facility | Payer: Medicaid Other | Attending: Internal Medicine | Admitting: Internal Medicine

## 2017-09-24 ENCOUNTER — Inpatient Hospital Stay (HOSPITAL_COMMUNITY): Payer: Medicaid Other

## 2017-09-24 ENCOUNTER — Encounter (HOSPITAL_COMMUNITY): Payer: Self-pay

## 2017-09-24 DIAGNOSIS — Z79899 Other long term (current) drug therapy: Secondary | ICD-10-CM | POA: Diagnosis not present

## 2017-09-24 DIAGNOSIS — Z8673 Personal history of transient ischemic attack (TIA), and cerebral infarction without residual deficits: Secondary | ICD-10-CM

## 2017-09-24 DIAGNOSIS — I120 Hypertensive chronic kidney disease with stage 5 chronic kidney disease or end stage renal disease: Secondary | ICD-10-CM | POA: Diagnosis present

## 2017-09-24 DIAGNOSIS — E119 Type 2 diabetes mellitus without complications: Secondary | ICD-10-CM | POA: Diagnosis present

## 2017-09-24 DIAGNOSIS — I69354 Hemiplegia and hemiparesis following cerebral infarction affecting left non-dominant side: Secondary | ICD-10-CM | POA: Diagnosis not present

## 2017-09-24 DIAGNOSIS — E1122 Type 2 diabetes mellitus with diabetic chronic kidney disease: Secondary | ICD-10-CM | POA: Diagnosis not present

## 2017-09-24 DIAGNOSIS — E1169 Type 2 diabetes mellitus with other specified complication: Secondary | ICD-10-CM | POA: Diagnosis not present

## 2017-09-24 DIAGNOSIS — R6521 Severe sepsis with septic shock: Secondary | ICD-10-CM | POA: Diagnosis present

## 2017-09-24 DIAGNOSIS — K610 Anal abscess: Secondary | ICD-10-CM

## 2017-09-24 DIAGNOSIS — Z880 Allergy status to penicillin: Secondary | ICD-10-CM

## 2017-09-24 DIAGNOSIS — R1310 Dysphagia, unspecified: Secondary | ICD-10-CM | POA: Diagnosis present

## 2017-09-24 DIAGNOSIS — N186 End stage renal disease: Secondary | ICD-10-CM | POA: Diagnosis present

## 2017-09-24 DIAGNOSIS — G8929 Other chronic pain: Secondary | ICD-10-CM | POA: Diagnosis not present

## 2017-09-24 DIAGNOSIS — Z794 Long term (current) use of insulin: Secondary | ICD-10-CM | POA: Diagnosis not present

## 2017-09-24 DIAGNOSIS — Z87891 Personal history of nicotine dependence: Secondary | ICD-10-CM

## 2017-09-24 DIAGNOSIS — Z431 Encounter for attention to gastrostomy: Secondary | ICD-10-CM | POA: Diagnosis not present

## 2017-09-24 DIAGNOSIS — Z992 Dependence on renal dialysis: Secondary | ICD-10-CM | POA: Diagnosis not present

## 2017-09-24 DIAGNOSIS — Z7901 Long term (current) use of anticoagulants: Secondary | ICD-10-CM

## 2017-09-24 DIAGNOSIS — A419 Sepsis, unspecified organism: Secondary | ICD-10-CM | POA: Diagnosis present

## 2017-09-24 DIAGNOSIS — E1149 Type 2 diabetes mellitus with other diabetic neurological complication: Secondary | ICD-10-CM

## 2017-09-24 DIAGNOSIS — I10 Essential (primary) hypertension: Secondary | ICD-10-CM | POA: Diagnosis not present

## 2017-09-24 DIAGNOSIS — T85528A Displacement of other gastrointestinal prosthetic devices, implants and grafts, initial encounter: Secondary | ICD-10-CM

## 2017-09-24 LAB — URINALYSIS, ROUTINE W REFLEX MICROSCOPIC
Bacteria, UA: NONE SEEN
Bilirubin Urine: NEGATIVE
Glucose, UA: NEGATIVE mg/dL
Hgb urine dipstick: NEGATIVE
Ketones, ur: NEGATIVE mg/dL
Leukocytes, UA: NEGATIVE
Nitrite: NEGATIVE
Protein, ur: >=300 mg/dL — AB
Specific Gravity, Urine: 1.02 (ref 1.005–1.030)
pH: 6 (ref 5.0–8.0)

## 2017-09-24 LAB — COMPREHENSIVE METABOLIC PANEL
ALT: 59 U/L (ref 17–63)
AST: 34 U/L (ref 15–41)
Albumin: 2.6 g/dL — ABNORMAL LOW (ref 3.5–5.0)
Alkaline Phosphatase: 102 U/L (ref 38–126)
Anion gap: 11 (ref 5–15)
BUN: 24 mg/dL — ABNORMAL HIGH (ref 6–20)
CO2: 26 mmol/L (ref 22–32)
Calcium: 9.2 mg/dL (ref 8.9–10.3)
Chloride: 103 mmol/L (ref 101–111)
Creatinine, Ser: 0.9 mg/dL (ref 0.61–1.24)
GFR calc Af Amer: >60 mL/min (ref >60)
GFR calc non Af Amer: >60 mL/min (ref >60)
Glucose, Bld: 141 mg/dL — ABNORMAL HIGH (ref 65–99)
Potassium: 4.2 mmol/L (ref 3.5–5.1)
Sodium: 140 mmol/L (ref 135–145)
Total Bilirubin: 1 mg/dL (ref 0.3–1.2)
Total Protein: 8.3 g/dL — ABNORMAL HIGH (ref 6.5–8.1)

## 2017-09-24 LAB — CBC WITH DIFFERENTIAL/PLATELET
Basophils Absolute: 0.1 10*3/uL (ref 0.0–0.1)
Basophils Relative: 0 %
Eosinophils Absolute: 0.2 10*3/uL (ref 0.0–0.7)
Eosinophils Relative: 1 %
HCT: 38.9 % — ABNORMAL LOW (ref 39.0–52.0)
Hemoglobin: 11.9 g/dL — ABNORMAL LOW (ref 13.0–17.0)
Lymphocytes Relative: 6 %
Lymphs Abs: 1.4 10*3/uL (ref 0.7–4.0)
MCH: 27.7 pg (ref 26.0–34.0)
MCHC: 30.6 g/dL (ref 30.0–36.0)
MCV: 90.7 fL (ref 78.0–100.0)
Monocytes Absolute: 3.2 10*3/uL — ABNORMAL HIGH (ref 0.1–1.0)
Monocytes Relative: 13 %
Neutro Abs: 19 10*3/uL — ABNORMAL HIGH (ref 1.7–7.7)
Neutrophils Relative %: 80 %
Platelets: 271 10*3/uL (ref 150–400)
RBC: 4.29 MIL/uL (ref 4.22–5.81)
RDW: 15.1 % (ref 11.5–15.5)
WBC: 23.8 10*3/uL — ABNORMAL HIGH (ref 4.0–10.5)

## 2017-09-24 LAB — CBG MONITORING, ED
Glucose-Capillary: 95 mg/dL (ref 65–99)
Glucose-Capillary: 107 mg/dL — ABNORMAL HIGH (ref 65–99)

## 2017-09-24 LAB — PROTIME-INR
INR: 1.31
Prothrombin Time: 16.2 seconds — ABNORMAL HIGH (ref 11.4–15.2)

## 2017-09-24 LAB — PHOSPHORUS: Phosphorus: 2.2 mg/dL — ABNORMAL LOW (ref 2.5–4.6)

## 2017-09-24 LAB — LIPASE, BLOOD: Lipase: 35 U/L (ref 11–51)

## 2017-09-24 LAB — I-STAT CG4 LACTIC ACID, ED: LACTIC ACID, VENOUS: 1.55 mmol/L (ref 0.5–1.9)

## 2017-09-24 LAB — INFLUENZA PANEL BY PCR (TYPE A & B)
Influenza A By PCR: NEGATIVE
Influenza B By PCR: NEGATIVE

## 2017-09-24 MED ORDER — CHLORHEXIDINE GLUCONATE CLOTH 2 % EX PADS
6.0000 | MEDICATED_PAD | Freq: Every day | CUTANEOUS | Status: DC
  Administered 2017-09-25 – 2017-09-29 (×4): 6 via TOPICAL

## 2017-09-24 MED ORDER — ACETAMINOPHEN 325 MG PO TABS
650.0000 mg | ORAL_TABLET | Freq: Four times a day (QID) | ORAL | Status: DC | PRN
  Administered 2017-09-26 (×2): 650 mg via ORAL
  Filled 2017-09-24 (×3): qty 2

## 2017-09-24 MED ORDER — SODIUM CHLORIDE 0.9% FLUSH
3.0000 mL | Freq: Two times a day (BID) | INTRAVENOUS | Status: DC
  Administered 2017-09-25 – 2017-09-30 (×9): 3 mL via INTRAVENOUS

## 2017-09-24 MED ORDER — ONDANSETRON HCL 4 MG/2ML IJ SOLN: 4.0000 mg | Freq: Four times a day (QID) | INTRAMUSCULAR | Status: DC | PRN

## 2017-09-24 MED ORDER — SODIUM CHLORIDE 0.9 % IV BOLUS (SEPSIS)
2500.0000 mL | Freq: Once | INTRAVENOUS | Status: AC
  Administered 2017-09-24: 2500 mL via INTRAVENOUS

## 2017-09-24 MED ORDER — POLYETHYLENE GLYCOL 3350 17 G PO PACK
17.0000 g | PACK | Freq: Every day | ORAL | Status: DC
  Administered 2017-09-26 – 2017-09-29 (×3): 17 g
  Filled 2017-09-24 (×3): qty 1

## 2017-09-24 MED ORDER — IOPAMIDOL (ISOVUE-300) INJECTION 61%
100.0000 mL | Freq: Once | INTRAVENOUS | Status: AC | PRN
  Administered 2017-09-24: 100 mL via INTRAVENOUS

## 2017-09-24 MED ORDER — CEFEPIME HCL 1 G IJ SOLR: 500.0000 mg | Freq: Three times a day (TID) | INTRAMUSCULAR | Status: DC

## 2017-09-24 MED ORDER — SODIUM CHLORIDE 0.9% FLUSH
10.0000 mL | Freq: Two times a day (BID) | INTRAVENOUS | Status: DC
  Administered 2017-09-25 – 2017-09-30 (×11): 10 mL

## 2017-09-24 MED ORDER — INSULIN ASPART 100 UNIT/ML ~~LOC~~ SOLN: 0.0000 [IU] | Freq: Three times a day (TID) | SUBCUTANEOUS | Status: DC

## 2017-09-24 MED ORDER — CHLORHEXIDINE GLUCONATE 0.12 % MT SOLN
15.0000 mL | Freq: Two times a day (BID) | OROMUCOSAL | Status: DC
  Administered 2017-09-25 – 2017-09-30 (×7): 15 mL via OROMUCOSAL
  Filled 2017-09-24 (×8): qty 15

## 2017-09-24 MED ORDER — HYDRALAZINE HCL 20 MG/ML IJ SOLN: 10.0000 mg | Freq: Three times a day (TID) | INTRAMUSCULAR | Status: DC | PRN

## 2017-09-24 MED ORDER — NOREPINEPHRINE 4 MG/250ML-% IV SOLN
INTRAVENOUS | Status: AC
  Filled 2017-09-24: qty 250

## 2017-09-24 MED ORDER — ONDANSETRON HCL 4 MG PO TABS: 4.0000 mg | ORAL_TABLET | Freq: Four times a day (QID) | ORAL | Status: DC | PRN

## 2017-09-24 MED ORDER — PIPERACILLIN-TAZOBACTAM 3.375 G IVPB 30 MIN
3.3750 g | Freq: Once | INTRAVENOUS | Status: AC
  Administered 2017-09-24: 3.375 g via INTRAVENOUS
  Filled 2017-09-24: qty 50

## 2017-09-24 MED ORDER — ENSURE ENLIVE PO LIQD
237.0000 mL | Freq: Two times a day (BID) | ORAL | Status: DC
  Administered 2017-09-26 – 2017-09-28 (×3): 237 mL via ORAL

## 2017-09-24 MED ORDER — INSULIN ASPART 100 UNIT/ML ~~LOC~~ SOLN
0.0000 [IU] | SUBCUTANEOUS | Status: DC
  Administered 2017-09-25 (×3): 2 [IU] via SUBCUTANEOUS
  Administered 2017-09-28: 3 [IU] via SUBCUTANEOUS
  Administered 2017-09-29 (×2): 2 [IU] via SUBCUTANEOUS
  Administered 2017-09-29: 3 [IU] via SUBCUTANEOUS
  Administered 2017-09-30: 2 [IU] via SUBCUTANEOUS

## 2017-09-24 MED ORDER — SODIUM CHLORIDE 0.9 % IV SOLN
INTRAVENOUS | Status: DC
  Administered 2017-09-25: 21:00:00 via INTRAVENOUS
  Administered 2017-09-25: 125 mL/h via INTRAVENOUS
  Administered 2017-09-26 – 2017-09-30 (×8): via INTRAVENOUS

## 2017-09-24 MED ORDER — PANTOPRAZOLE SODIUM 40 MG PO PACK: 40.0000 mg | PACK | Freq: Every day | ORAL | Status: DC

## 2017-09-24 MED ORDER — ACETAMINOPHEN 650 MG RE SUPP
650.0000 mg | Freq: Once | RECTAL | Status: AC
  Administered 2017-09-24: 650 mg via RECTAL
  Filled 2017-09-24: qty 1

## 2017-09-24 MED ORDER — ACETAMINOPHEN 650 MG RE SUPP
650.0000 mg | Freq: Four times a day (QID) | RECTAL | Status: DC | PRN
  Administered 2017-09-25: 650 mg via RECTAL
  Filled 2017-09-24: qty 1

## 2017-09-24 MED ORDER — APIXABAN 5 MG PO TABS
5.0000 mg | ORAL_TABLET | Freq: Two times a day (BID) | ORAL | Status: DC
  Administered 2017-09-25: 5 mg
  Filled 2017-09-24: qty 1

## 2017-09-24 MED ORDER — SODIUM CHLORIDE 0.9% FLUSH: 10.0000 mL | INTRAVENOUS | Status: DC | PRN

## 2017-09-24 MED ORDER — VANCOMYCIN HCL IN DEXTROSE 1-5 GM/200ML-% IV SOLN
1000.0000 mg | Freq: Once | INTRAVENOUS | Status: AC
  Administered 2017-09-24: 1000 mg via INTRAVENOUS
  Filled 2017-09-24: qty 200

## 2017-09-24 MED ORDER — SODIUM CHLORIDE 0.9 % IV BOLUS (SEPSIS)
1000.0000 mL | Freq: Once | INTRAVENOUS | Status: AC
  Administered 2017-09-24: 1000 mL via INTRAVENOUS

## 2017-09-24 MED ORDER — DOCUSATE SODIUM 50 MG/5ML PO LIQD
100.0000 mg | Freq: Two times a day (BID) | ORAL | Status: DC | PRN
  Filled 2017-09-24: qty 10

## 2017-09-24 MED ORDER — BISACODYL 10 MG RE SUPP: 10.0000 mg | Freq: Every day | RECTAL | Status: DC | PRN

## 2017-09-24 MED ORDER — NOREPINEPHRINE BITARTRATE 1 MG/ML IV SOLN
0.0000 ug/min | INTRAVENOUS | Status: DC
  Administered 2017-09-25: 2 ug/min via INTRAVENOUS
  Administered 2017-09-25: 1 ug/min via INTRAVENOUS
  Administered 2017-09-25: 7 ug/min via INTRAVENOUS
  Filled 2017-09-24: qty 4

## 2017-09-24 MED ORDER — INSULIN DETEMIR 100 UNIT/ML ~~LOC~~ SOLN
34.0000 [IU] | Freq: Every day | SUBCUTANEOUS | Status: DC
  Administered 2017-09-25: 34 [IU] via SUBCUTANEOUS
  Filled 2017-09-24 (×2): qty 0.34

## 2017-09-24 NOTE — ED Notes (Signed)
EDP to start cental line.

## 2017-09-24 NOTE — ED Notes (Signed)
Dr. Wilson Singer discontinued 2nd lactic acid.

## 2017-09-24 NOTE — ED Provider Notes (Signed)
[facility] EMERGENCY DEPARTMENT Provider Note   CSN: [id number] Arrival date & time: 09/24/17  1459     History   Chief Complaint Chief Complaint  Patient presents with  . pulled peg tube out    HPI Joe Wilkinson is a 57 y.o. male.  HPI   35yM sent for evaluation after g-tube was accidentlaly removed while changing his clothes. Supervisor at the facility placed a Foley catheter prior to sending to the emergency room.  Patient is complaining of some discomfort at the site of his G-tube.  He has no other acute complaints.  On arrival he is noted to be febrile with a temperature of 103, tachycardic and hypotensive.  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  Past Medical History:  Diagnosis Date  . DM (diabetes mellitus) ([place])   . Hypertension   . Stroke [facility])     Patient Active Problem List   Diagnosis Date Noted  . Vitamin D deficiency 09/13/2017  . 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  . S/P percutaneous endoscopic gastrostomy (PEG) tube placement ([place]) 07/14/2017  . Type 1 diabetes mellitus with complications ([place]) 46/96/2952  . Hypertension   . Acute blood loss anemia 04/27/2017  . Headache 04/27/2017  . AF (paroxysmal atrial fibrillation) ([place]) 04/27/2017  . Acute respiratory failure ([place])   . Compromised airway   . CVA (cerebral vascular accident) ([place]) 04/01/2017  . Cytotoxic cerebral edema ([place]) 04/01/2017  . DKA (diabetic ketoacidoses) ([place]) 03/30/2017    Past Surgical History:  Procedure Laterality Date  . CRANIECTOMY Right 04/01/2017   Procedure: RIGHT DECOMPRESSIVE CRANIECTOMY;  Surgeon: Ditty, Kevan Ny, MD;  Location: [place];  Service: Neurosurgery;  Laterality: Right;  . ESOPHAGOGASTRODUODENOSCOPY N/A 04/13/2017   Procedure: ESOPHAGOGASTRODUODENOSCOPY (EGD);  Surgeon: Georganna Skeans, MD;  Location: [place];   Service: General;  Laterality: N/A;  bedside  . PEG PLACEMENT N/A 04/13/2017   Procedure: PERCUTANEOUS ENDOSCOPIC GASTROSTOMY (PEG) PLACEMENT;  Surgeon: Georganna Skeans, MD;  Location: [facility] ENDOSCOPY;  Service: General;  Laterality: N/A;       Home Medications    Prior to Admission medications   Medication Sig Start Date End Date Taking? Authorizing Provider  acetaminophen (TYLENOL) 325 MG tablet Place 650 mg into feeding tube every 4 (four) hours as needed.    [provider]  Amino Acids-Protein Hydrolys (FEEDING SUPPLEMENT, PRO-STAT SUGAR FREE 64,) LIQD Take 30 mLs by mouth daily. Per gastric tube    [provider]  apixaban (ELIQUIS) 5 MG TABS tablet Place 1 tablet (5 mg total) into feeding tube 2 (two) times daily. 05/29/17   Mikhail, Velta Addison, DO  bisacodyl (DULCOLAX) 10 MG suppository Place 1 suppository (10 mg total) rectally daily as needed for moderate constipation. 05/29/17   Mikhail, Velta Addison, DO  chlorhexidine (PERIDEX) 0.12 % solution 15 mLs by Mouth Rinse route 2 (two) times daily. 05/29/17   Cristal Ford, DO  Cholecalciferol (VITAMIN D3) 5000 units CAPS Take 1 capsule (5,000 Units total) by mouth daily. 09/13/17   Cassandria Anger, MD  docusate (COLACE) 50 MG/5ML liquid Place 10 mLs (100 mg total) into feeding tube 2 (two) times daily as needed for mild constipation. 05/29/17   Mikhail, Velta Addison, DO  docusate (COLACE) 50 MG/5ML liquid Take 100 mg by mouth 2 (two) times daily.    [provider]  feeding supplement, ENSURE ENLIVE, (ENSURE ENLIVE) LIQD Take 237 mLs by  mouth 2 (two) times daily between meals. 05/29/17   Mikhail, Velta Addison, DO  gabapentin (NEURONTIN) 300 MG capsule Take 1 capsule (300 mg total) by mouth at bedtime. 05/29/17   Mikhail, Velta Addison, DO  insulin detemir (LEVEMIR) 100 UNIT/ML injection Inject 0.34 mLs (34 Units total) into the skin daily. 05/29/17   Mikhail, Velta Addison, DO  metoprolol tartrate (LOPRESSOR) 25 MG tablet Place 0.5  tablets (12.5 mg total) into feeding tube 2 (two) times daily. 05/29/17   Mikhail, Velta Addison, DO  mirtazapine (REMERON) 15 MG tablet Take 7.5 mg by mouth at bedtime.    [provider]  Nutritional Supplements (FEEDING SUPPLEMENT, GLUCERNA 1.2 CAL,) LIQD Glucerna 1.2 cal by peg tube Three times a day flush with 30 ml free water before and after administering full volume of TF.    [provider]  pantoprazole sodium (PROTONIX) 40 mg/20 mL PACK Place 20 mLs (40 mg total) into feeding tube daily. 05/29/17   Mikhail, Velta Addison, DO  polyethylene glycol (MIRALAX / GLYCOLAX) packet Place 17 g into feeding tube daily.    [provider]    Family History No family history on file.  Social History Social History   Tobacco Use  . Smoking status: Former Smoker    Types: Cigarettes  . Smokeless tobacco: Never Used  . Tobacco comment: UTA  Substance Use Topics  . Alcohol use: No    Comment: UTA  . Drug use: No    Comment: UTA     Allergies   Patient has no known allergies.   Review of Systems Review of Systems  All systems reviewed and negative, other than as noted in HPI.  Physical Exam Updated Vital Signs BP (!) 83/57   Pulse (!) 124   Temp (!) 103 F (39.4 C) (Oral)   Resp 18   Ht 5\' 6"  (1.676 m)   Wt 81.6 kg (180 lb)   SpO2 93%   BMI 29.05 kg/m   Physical Exam  Constitutional: He appears well-developed and well-nourished. No distress.  HENT:  Head: Normocephalic.  Previous R craniotomy  Eyes: Conjunctivae are normal. Right eye exhibits no discharge. Left eye exhibits no discharge.  Neck: Neck supple.  Cardiovascular: Normal rate, regular rhythm and normal heart sounds. Exam reveals no gallop and no friction rub.  No murmur heard. Pulmonary/Chest: Effort normal and breath sounds normal. No respiratory distress.  Abdominal: Soft. He exhibits no distension. There is no tenderness.  Gastrostomy with foley catheter in place. Protuberant abdomen.  Nontender.  Musculoskeletal: He exhibits no edema or tenderness.  Neurological: He is alert.  Skin: Skin is warm and dry.  Psychiatric: He has a normal mood and affect. His behavior is normal. Thought content normal.  Nursing note and vitals reviewed.    ED Treatments / Results  Labs (all labs ordered are listed, but only abnormal results are displayed) Labs Reviewed  COMPREHENSIVE METABOLIC PANEL - Abnormal; Notable for the following components:      Result Value   Glucose, Bld 141 (*)    BUN 24 (*)    Total Protein 8.3 (*)    Albumin 2.6 (*)    All other components within normal limits  CBC WITH DIFFERENTIAL/PLATELET - Abnormal; Notable for the following components:   WBC 23.8 (*)    Hemoglobin 11.9 (*)    HCT 38.9 (*)    Neutro Abs 19.0 (*)    Monocytes Absolute 3.2 (*)    All other components within normal limits  PROTIME-INR - Abnormal;  Notable for the following components:   Prothrombin Time 16.2 (*)    All other components within normal limits  URINALYSIS, ROUTINE W REFLEX MICROSCOPIC - Abnormal; Notable for the following components:   Protein, ur >=300 (*)    Squamous Epithelial / LPF 0-5 (*)    All other components within normal limits  PHOSPHORUS - Abnormal; Notable for the following components:   Phosphorus 2.2 (*)    All other components within normal limits  CULTURE, BLOOD (ROUTINE X 2)  INFLUENZA PANEL BY PCR (TYPE A & B)  LIPASE, BLOOD  BASIC METABOLIC PANEL  CBC  I-STAT CG4 LACTIC ACID, ED  I-STAT CG4 LACTIC ACID, ED    EKG  EKG Interpretation  Date/Time:  Sunday September 24 2017 15:20:53 EST Ventricular Rate:  128 PR Interval:    QRS Duration: 84 QT Interval:  289 QTC Calculation: 422 R Axis:   79 Text Interpretation:  Sinus tachycardia Nonspecific repol abnormality, inferior leads ST elevation, consider lateral injury Confirmed by Virgel Manifold [phone]) on 09/24/2017 7:53:47 PM       Radiology No results found.  Procedures Gastrostomy  tube replacement Date/Time: 09/24/2017 7:46 PM Performed by: Virgel Manifold, MD Authorized by: Virgel Manifold, MD  Consent: Verbal consent obtained. Risks and benefits: risks, benefits and alternatives were discussed Consent given by: patient Patient identity confirmed: verbally with patient and provided demographic data Preparation: Patient was prepped and draped in the usual sterile fashion. Local anesthesia used: no  Anesthesia: Local anesthesia used: no  Sedation: Patient sedated: no  Patient tolerance: Patient tolerated the procedure well with no immediate complications Comments: Foley catheter removed. 10F gastric tube placed easily. Balloon inflated with 6cc of NS. Pt tolerated well. Stomach content aspirated from tube.     CENTRAL LINE Performed by: Virgel Manifold Consent: The procedure was performed in an emergent situation. Required items: required blood products, implants, devices, and special equipment available Patient identity confirmed: arm band and provided demographic data Time out: Immediately prior to procedure a "time out" was called to verify the correct patient, procedure, equipment, support staff and site/side marked as required. Indications: vascular access Anesthesia: local infiltration Local anesthetic: lidocaine 1% without epinephrine Anesthetic total: 3 ml Patient sedated: no Preparation: skin prepped with 2% chlorhexidine Skin prep agent dried: skin prep agent completely dried prior to procedure Sterile barriers:  sterile barriers used - cap, mask, sterile gloves, and large sterile sheet Hand hygiene: hand hygiene performed prior to central venous catheter insertion  Location details: R femoral  Catheter type: triple lumen Catheter size: 8 Fr Pre-procedure: landmarks identified Ultrasound guidance: yes Successful placement: yes Post-procedure: line sutured and dressing applied Assessment: blood return through all parts, free fluid  flow. Patient tolerance: Patient tolerated the procedure well with no immediate complications.  CRITICAL CARE Performed by: Virgel Manifold Total critical care time: 40 minutes Critical care time was exclusive of separately billable procedures and treating other patients. Critical care was necessary to treat or prevent imminent or life-threatening deterioration. Critical care was time spent personally by me on the following activities: development of treatment plan with patient and/or surrogate as well as nursing, discussions with consultants, evaluation of patient's response to treatment, examination of patient, obtaining history from patient or surrogate, ordering and performing treatments and interventions, ordering and review of laboratory studies, ordering and review of radiographic studies, pulse oximetry and re-evaluation of patient's condition.   Medications Ordered in ED Medications  sodium chloride 0.9 % bolus 2,500 mL (not administered)  vancomycin (  VANCOCIN) IVPB 1000 mg/200 mL premix (not administered)  piperacillin-tazobactam (ZOSYN) IVPB 3.375 g (not administered)  acetaminophen (TYLENOL) suppository 650 mg (not administered)     Initial Impression / Assessment and Plan / ED Course  I have reviewed the triage vital signs and the nursing notes.  Pertinent labs & imaging results that were available during my care of the patient were reviewed by me and considered in my medical decision making (see chart for details).     41yM who perhaps fortunately had his g-tube accidentially removed. It was replaced (of note, this was done prior to CT). Noted to be febrile, hypotensive and tachycardic on arrival. Worked up for sepsis. Empiric abx. BP still soft after IVF. Central line placed at request of hospitalist service.   Final Clinical Impressions(s) / ED Diagnoses   Final diagnoses:  Sepsis, due to unspecified organism [facility])  Dislodged gastrostomy tube [facility])    ED Discharge  Orders    None       Virgel Manifold, MD 09/25/17 [phone]

## 2017-09-24 NOTE — ED Notes (Signed)
Pt is alert and oriented. Pt refusing in and out catheter. Pt informed and educated on the importance of a urine sample and pt states that he would like to use a urinal when he is ready to go. RN Lana Fish informed

## 2017-09-24 NOTE — ED Notes (Signed)
When this RN came on shift, the pt did not have any fluids running

## 2017-09-24 NOTE — ED Notes (Addendum)
Dr. Aggie Moats, hospitalist, came out of the pt's room at about 1920. Dr. Aggie Moats stated that the pt would need a central line and that he would notify the EDP.   Dr. Wilson Singer went into the room to start the central line at about 2025. The procedure took about 25-30 mins.   This RN was not able to administer medications on time that were ordered at [place] because the EDP was notified about 5-10 mins before after the medications were ordered.  This RN went in to update pt's vital signs after seeing the orders for the Levophed. Vitals were updated, cbg was take, oral temp was taken at this because ct was here to take pt over for an abdominal scan.  Pt currently in CT at this time.

## 2017-09-24 NOTE — ED Notes (Signed)
When this RN was receiving report on this pt, this RN was told that the pt was a hard stick, and they weren't able to collect the second lactic acid. Lab tried as well and was unsuccessful hence the central line being placed.  RN will collect 2nd lactic acid after cental line is placed.

## 2017-09-24 NOTE — ED Notes (Signed)
Pt continues to refuse in and out catheter, states that "it hurts too badly to get them catheters". Pt requested to be repositioned and stated that once he was repositioned he would be able to use condom catheter. Pt repositioned per request, and pt used condom catheter. Urine specimen obtained.

## 2017-09-24 NOTE — H&P (Signed)
[facility] History and Physical  [name] FAO:[id number] DOB: November 19, 1960 DOA: 09/24/2017  Referring physician:  PCP: Virgie Dad, MD   Chief Complaint: "I just felt bad."  HPI: Joe Wilkinson is a 57 y.o. male with a past medical history of diabetes, hypertension and stroke affecting the left side presents to the hospital with PEG tube out.  Patient states that [place] home.  Patient was getting help of his closed with his PEG tube was pulled free.  Patient was immediately transported to the emergency room via EMS.  Patient states otherwise day was normal.  Patient did not have any fever, chills, nausea, vomiting or any other symptoms.  ED course: Patient states that once arriving in the emergency room he felt bad.  Had a fever 103.  Started on broad spectrum antibiotics.  Fluid resuscitation initiated.  Soft blood pressures noted.  Central line requested from EDP.  Hospitalist consulted for admission.   Review of Systems:  As per HPI otherwise 10 point review of systems negative.    Past Medical History:  Diagnosis Date  . DM (diabetes mellitus) ([place])   . Hypertension   . Stroke [facility])    Past Surgical History:  Procedure Laterality Date  . CRANIECTOMY Right 04/01/2017   Procedure: RIGHT DECOMPRESSIVE CRANIECTOMY;  Surgeon: Ditty, Kevan Ny, MD;  Location: [place];  Service: Neurosurgery;  Laterality: Right;  . ESOPHAGOGASTRODUODENOSCOPY N/A 04/13/2017   Procedure: ESOPHAGOGASTRODUODENOSCOPY (EGD);  Surgeon: Georganna Skeans, MD;  Location: [place];  Service: General;  Laterality: N/A;  bedside  . PEG PLACEMENT N/A 04/13/2017   Procedure: PERCUTANEOUS ENDOSCOPIC GASTROSTOMY (PEG) PLACEMENT;  Surgeon: Georganna Skeans, MD;  Location: [place];  Service: General;  Laterality: N/A;   Social History:  reports that he has quit smoking. His smoking use included cigarettes. he has never used smokeless tobacco. He reports that he does not drink alcohol or use  drugs.  No Known Allergies  No family history on file.   Prior to Admission medications   Medication Sig Start Date End Date Taking? Authorizing Provider  Amino Acids-Protein Hydrolys (FEEDING SUPPLEMENT, PRO-STAT SUGAR FREE 64,) LIQD Place 30 mLs into feeding tube daily. Per gastric tube    Yes [provider]  apixaban (ELIQUIS) 5 MG TABS tablet Place 1 tablet (5 mg total) into feeding tube 2 (two) times daily. 05/29/17  Yes Mikhail, Velta Addison, DO  bisacodyl (DULCOLAX) 10 MG suppository Place 1 suppository (10 mg total) rectally daily as needed for moderate constipation. 05/29/17  Yes Mikhail, Velta Addison, DO  chlorhexidine (PERIDEX) 0.12 % solution 15 mLs by Mouth Rinse route 2 (two) times daily. 05/29/17  Yes Mikhail, Velta Addison, DO  Cholecalciferol (VITAMIN D3) 5000 units CAPS Take 1 capsule (5,000 Units total) by mouth daily. Patient taking differently: Place 5,000 Units into feeding tube daily.  09/13/17  Yes Nida, Marella Chimes, MD  docusate (COLACE) 50 MG/5ML liquid Place 10 mLs (100 mg total) into feeding tube 2 (two) times daily as needed for mild constipation. 05/29/17  Yes Mikhail, [place], DO  feeding supplement, ENSURE ENLIVE, (ENSURE ENLIVE) LIQD Take 237 mLs by mouth 2 (two) times daily between meals. 05/29/17  Yes Mikhail, Maryann, DO  gabapentin (NEURONTIN) 300 MG capsule Take 1 capsule (300 mg total) by mouth at bedtime. 05/29/17  Yes Mikhail, Velta Addison, DO  insulin detemir (LEVEMIR) 100 UNIT/ML injection Inject 0.34 mLs (34 Units total) into the skin daily. 05/29/17  Yes Mikhail, [place], DO  metoprolol tartrate (LOPRESSOR) 25 MG tablet Place 0.5 tablets (  12.5 mg total) into feeding tube 2 (two) times daily. 05/29/17  Yes Mikhail, Maryann, DO  mirtazapine (REMERON) 15 MG tablet Take 7.5 mg by mouth at bedtime.   Yes [provider]  Nutritional Supplements (FEEDING SUPPLEMENT, GLUCERNA 1.2 CAL,) LIQD Glucerna 1.2 cal by peg tube Three times a day flush with 30 ml free  water before and after administering full volume of TF.   Yes [provider]  pantoprazole sodium (PROTONIX) 40 mg/20 mL PACK Place 20 mLs (40 mg total) into feeding tube daily. 05/29/17  Yes Mikhail, [place], DO  polyethylene glycol (MIRALAX / GLYCOLAX) packet Place 17 g into feeding tube daily.   Yes [provider]  Water For Irrigation, Sterile (FREE WATER) SOLN Place 300 mLs into feeding tube every 4 (four) hours. Keep HOB elevated at 45 degrees at all times during feedings, flushings, and medications 12a, 4a, 8a, 12p, 4p,8p   Yes [provider]  witch hazel-glycerin (TUCKS) pad Apply 1 application topically as needed for itching.   Yes [provider]  zoledronic acid (RECLAST) 5 MG/100ML SOLN injection Inject 5 mg into the vein once.   Yes [provider]  acetaminophen (TYLENOL) 325 MG tablet Place 650 mg into feeding tube every 4 (four) hours as needed.    [provider]   Physical Exam: Vitals:   09/24/17 1720 09/24/17 1751 09/24/17 1821 09/24/17 1839  BP: (!) 101/58 (S) (!) 89/58 (!) 133/113 (!) 114/96  Pulse: (!) 122 (!) 124 (!) 121 (!) 123  Resp: (!) 22 (!) 24 (!) 22 18  Temp: 100 F (37.8 C)     TempSrc: Oral     SpO2: 97% 97% 96% 98%  Weight:      Height:        Wt Readings from Last 3 Encounters:  09/24/17 81.6 kg (180 lb)  09/04/17 77.8 kg (171 lb 9.6 oz)  08/30/17 77.8 kg (171 lb 9.6 oz)    General:  Appears calm and comfortable; A&Ox3 Eyes:  PERRL, EOMI, normal lids, iris ENT:  grossly normal hearing, lips & tongue Neck:  no LAD, masses or thyromegaly Cardiovascular:  Tachy, RR, no m/r/g. No LE edema.  Respiratory:  CTA bilaterally, no w/r/r. Normal respiratory effort. Abdomen:  soft, nd, diff ttp, peg tube Skin:  no rash or induration seen on limited exam Musculoskeletal:  grossly normal tone BUE/BLE Psychiatric:  grossly normal mood and affect, speech fluent and appropriate Neurologic:  CN 2-12 grossly  intact, contractures on L extr          Labs on Admission:  Basic Metabolic Panel: Recent Labs  Lab 09/24/17 1527  NA 140  K 4.2  CL 103  CO2 26  GLUCOSE 141*  BUN 24*  CREATININE 0.90  CALCIUM 9.2   Liver Function Tests: Recent Labs  Lab 09/24/17 1527  AST 34  ALT 59  ALKPHOS 102  BILITOT 1.0  PROT 8.3*  ALBUMIN 2.6*   No results for input(s): LIPASE, AMYLASE in the last 168 hours. No results for input(s): AMMONIA in the last 168 hours. CBC: Recent Labs  Lab 09/24/17 1527  WBC 23.8*  NEUTROABS 19.0*  HGB 11.9*  HCT 38.9*  MCV 90.7  PLT 271   Cardiac Enzymes: No results for input(s): CKTOTAL, CKMB, CKMBINDEX, TROPONINI in the last 168 hours.  BNP (last 3 results) No results for input(s): BNP in the last 8760 hours.  ProBNP (last 3 results) No results for input(s): PROBNP in the last  8760 hours.   Serum creatinine: 0.9 mg/dL 09/24/17 1527 Estimated creatinine clearance: 91.9 mL/min  CBG: No results for input(s): GLUCAP in the last 168 hours.  Radiological Exams on Admission: Dg Chest Portable 1 View  Result Date: 09/24/2017 CLINICAL DATA:  Fever. EXAM: PORTABLE CHEST 1 VIEW COMPARISON:  05/26/2017 FINDINGS: The heart size and mediastinal contours are within normal limits. Both lungs are clear. The visualized skeletal structures are unremarkable. IMPRESSION: No active disease. Electronically Signed   By: Kerby Moors M.D.   On: 09/24/2017 16:15    EKG: Independently reviewed. Sinus tach.  Assessment/Plan Active Problems:   Sepsis ([place])   Severe Sepsis 2/2 unk/hypotension Patient hemodynamically stable Given Vanc emergency room, will continue Given Maxipime in the emergency room, will continue Urine culture pending Blood cultures 2 pending Patient given 2500 mL of fluid in the emergency room Lactic acid normal Lipase ordered CXR clear Flu neg CT abd pelvis w /cont Central line to be placed by EDP, levophed with goal map of  65  Hypertension When necessary hydralazine 10 mg IV as needed for severe blood pressure Hold lopressor  DM SSI q4 Cont glargine 34u qd  Stroke Cont prophylactic meds Restart TF in AM  Chronic pain Hold neurontin  Code Status: FC DVT Prophylaxis: eliquis  Family Communication: none available Disposition Plan: Pending Improvement  Status: ICU inpt  Elwin Mocha, MD Family Medicine [facility] [email or web address] Password [email or web address]

## 2017-09-24 NOTE — ED Notes (Signed)
PEG tube replaced by D Kohut. Patient tolerated well. Gauze and hyperflex dressing applied.

## 2017-09-24 NOTE — ED Notes (Signed)
Condom catheter applied to pt 

## 2017-09-24 NOTE — ED Triage Notes (Signed)
Per [facility] Staff, staff was helping changed his clothes and accidentaly pulled out peg tube.  Pt c/o pain at site.  Supervisor placed foley cath tube in place.

## 2017-09-25 ENCOUNTER — Other Ambulatory Visit: Payer: Self-pay

## 2017-09-25 LAB — CBC
HEMATOCRIT: 33.7 % — AB (ref 39.0–52.0)
Hemoglobin: 10.1 g/dL — ABNORMAL LOW (ref 13.0–17.0)
MCH: 27.4 pg (ref 26.0–34.0)
MCHC: 30 g/dL (ref 30.0–36.0)
MCV: 91.3 fL (ref 78.0–100.0)
Platelets: 290 10*3/uL (ref 150–400)
RBC: 3.69 MIL/uL — AB (ref 4.22–5.81)
RDW: 15.4 % (ref 11.5–15.5)
WBC: 25 10*3/uL — AB (ref 4.0–10.5)

## 2017-09-25 LAB — GLUCOSE, CAPILLARY
GLUCOSE-CAPILLARY: 111 mg/dL — AB (ref 65–99)
GLUCOSE-CAPILLARY: 133 mg/dL — AB (ref 65–99)
GLUCOSE-CAPILLARY: 142 mg/dL — AB (ref 65–99)
GLUCOSE-CAPILLARY: 96 mg/dL (ref 65–99)
Glucose-Capillary: 120 mg/dL — ABNORMAL HIGH (ref 65–99)
Glucose-Capillary: 157 mg/dL — ABNORMAL HIGH (ref 65–99)
Glucose-Capillary: 148 mg/dL — ABNORMAL HIGH (ref 65–99)
Glucose-Capillary: 72 mg/dL (ref 65–99)

## 2017-09-25 LAB — BASIC METABOLIC PANEL
ANION GAP: 10 (ref 5–15)
BUN: 13 mg/dL (ref 6–20)
CO2: 19 mmol/L — ABNORMAL LOW (ref 22–32)
Calcium: 8 mg/dL — ABNORMAL LOW (ref 8.9–10.3)
Chloride: 112 mmol/L — ABNORMAL HIGH (ref 101–111)
Creatinine, Ser: 0.69 mg/dL (ref 0.61–1.24)
Glucose, Bld: 146 mg/dL — ABNORMAL HIGH (ref 65–99)
POTASSIUM: 3.4 mmol/L — AB (ref 3.5–5.1)
SODIUM: 141 mmol/L (ref 135–145)

## 2017-09-25 LAB — MAGNESIUM: Magnesium: 2.1 mg/dL (ref 1.7–2.4)

## 2017-09-25 LAB — MRSA PCR SCREENING: MRSA by PCR: NEGATIVE

## 2017-09-25 MED ORDER — LIDOCAINE HCL (PF) 1 % IJ SOLN
5.0000 mL | Freq: Once | INTRAMUSCULAR | Status: AC
  Administered 2017-09-25: 5 mL via INTRADERMAL

## 2017-09-25 MED ORDER — METRONIDAZOLE IN NACL 5-0.79 MG/ML-% IV SOLN
500.0000 mg | Freq: Three times a day (TID) | INTRAVENOUS | Status: DC
  Administered 2017-09-25 – 2017-09-29 (×12): 500 mg via INTRAVENOUS
  Filled 2017-09-25 (×13): qty 100

## 2017-09-25 MED ORDER — CEFEPIME HCL 1 G IJ SOLR
1.0000 g | Freq: Three times a day (TID) | INTRAMUSCULAR | Status: DC
  Administered 2017-09-25 – 2017-09-29 (×14): 1 g via INTRAVENOUS
  Filled 2017-09-25 (×25): qty 1

## 2017-09-25 MED ORDER — NOREPINEPHRINE 4 MG/250ML-% IV SOLN
INTRAVENOUS | Status: AC
  Filled 2017-09-25: qty 250

## 2017-09-25 MED ORDER — VANCOMYCIN HCL IN DEXTROSE 1-5 GM/200ML-% IV SOLN
1000.0000 mg | Freq: Two times a day (BID) | INTRAVENOUS | Status: DC
  Administered 2017-09-25 – 2017-09-27 (×5): 1000 mg via INTRAVENOUS
  Filled 2017-09-25 (×4): qty 200

## 2017-09-25 MED ORDER — POTASSIUM CHLORIDE 10 MEQ/100ML IV SOLN
10.0000 meq | INTRAVENOUS | Status: AC
  Administered 2017-09-25 (×4): 10 meq via INTRAVENOUS
  Filled 2017-09-25 (×4): qty 100

## 2017-09-25 MED ORDER — MORPHINE SULFATE (PF) 2 MG/ML IV SOLN
INTRAVENOUS | Status: AC
  Filled 2017-09-25: qty 1

## 2017-09-25 MED ORDER — DEXTROSE 5 % IV SOLN: 500.0000 mg | Freq: Three times a day (TID) | INTRAVENOUS | Status: DC

## 2017-09-25 MED ORDER — MORPHINE SULFATE (PF) 2 MG/ML IV SOLN
2.0000 mg | Freq: Once | INTRAVENOUS | Status: AC
  Administered 2017-09-25: 2 mg via INTRAVENOUS

## 2017-09-25 MED ORDER — PANTOPRAZOLE SODIUM 40 MG IV SOLR
40.0000 mg | INTRAVENOUS | Status: DC
  Administered 2017-09-25 – 2017-09-30 (×5): 40 mg via INTRAVENOUS
  Filled 2017-09-25 (×5): qty 40

## 2017-09-25 MED ORDER — LIDOCAINE HCL (PF) 1 % IJ SOLN
INTRAMUSCULAR | Status: AC
  Filled 2017-09-25: qty 5

## 2017-09-25 NOTE — Consult Note (Signed)
[facility] Consult  Reason for Consult: Left gluteal abscess  Referring Physician:  Dr. Manuella Ghazi   Chief Complaint    pulled peg tube out      Joe Wilkinson is a 57 y.o. male.  HPI: Joe Wilkinson is a 57 yo who presented from his SNF "feeling bad."  He also was found to have his PEG dislodged on arrival to the ED and a foley was placed back into the area.  CT scan has demonstrated a left gluteal abscess and question of a right gluteal abscess and an abscess in the anorectal junction posteriorly.  He stays at the SNF and has been there since his Stroke s/p Right decompressive craniectomy and PEG placement by GI. He says that his PEG is used daily and nightly for supplemental feeds.  He does take in some food by mouth but says he feels full most of the time.    CT scan demonstrated the PEG within the stomach. No drainage or abscess around the PEG.   Past Medical History:  Diagnosis Date  . DM (diabetes mellitus) ([place])   . Hypertension   . Stroke [facility])     Past Surgical History:  Procedure Laterality Date  . CRANIECTOMY Right 04/01/2017   Procedure: RIGHT DECOMPRESSIVE CRANIECTOMY;  Surgeon: Ditty, Kevan Ny, MD;  Location: [place];  Service: Neurosurgery;  Laterality: Right;  . ESOPHAGOGASTRODUODENOSCOPY N/A 04/13/2017   Procedure: ESOPHAGOGASTRODUODENOSCOPY (EGD);  Surgeon: Georganna Skeans, MD;  Location: [place];  Service: General;  Laterality: N/A;  bedside  . PEG PLACEMENT N/A 04/13/2017   Procedure: PERCUTANEOUS ENDOSCOPIC GASTROSTOMY (PEG) PLACEMENT;  Surgeon: Georganna Skeans, MD;  Location: [facility] ENDOSCOPY;  Service: General;  Laterality: N/A;    No family history on file.  Social History   Tobacco Use  . Smoking status: Former Smoker    Types: Cigarettes  . Smokeless tobacco: Never Used  . Tobacco comment: UTA  Substance Use Topics  . Alcohol use: No    Comment: UTA  . Drug use: No    Comment: UTA    Medications:  I have reviewed the patient's  current medications. Prior to Admission:  Medications Prior to Admission  Medication Sig Dispense Refill Last Dose  . Amino Acids-Protein Hydrolys (FEEDING SUPPLEMENT, PRO-STAT SUGAR FREE 64,) LIQD Place 30 mLs into feeding tube daily. Per gastric tube    09/24/2017 at Unknown time  . apixaban (ELIQUIS) 5 MG TABS tablet Place 1 tablet (5 mg total) into feeding tube 2 (two) times daily. 60 tablet  09/24/2017 at [place]  . bisacodyl (DULCOLAX) 10 MG suppository Place 1 suppository (10 mg total) rectally daily as needed for moderate constipation. 12 suppository 0 unknown  . chlorhexidine (PERIDEX) 0.12 % solution 15 mLs by Mouth Rinse route 2 (two) times daily. 120 mL 0 09/24/2017 at Unknown time  . Cholecalciferol (VITAMIN D3) 5000 units CAPS Take 1 capsule (5,000 Units total) by mouth daily. (Patient taking differently: Place 5,000 Units into feeding tube daily. ) 90 capsule 0 09/24/2017 at Unknown time  . docusate (COLACE) 50 MG/5ML liquid Place 10 mLs (100 mg total) into feeding tube 2 (two) times daily as needed for mild constipation. 100 mL 0 09/24/2017 at Unknown time  . feeding supplement, ENSURE ENLIVE, (ENSURE ENLIVE) LIQD Take 237 mLs by mouth 2 (two) times daily between meals. 237 mL 12 09/24/2017 at Unknown time  . gabapentin (NEURONTIN) 300 MG capsule Take 1 capsule (300 mg total) by mouth at bedtime.   09/23/2017 at Unknown  time  . insulin detemir (LEVEMIR) 100 UNIT/ML injection Inject 0.34 mLs (34 Units total) into the skin daily. 10 mL 11 09/24/2017 at Unknown time  . metoprolol tartrate (LOPRESSOR) 25 MG tablet Place 0.5 tablets (12.5 mg total) into feeding tube 2 (two) times daily.   09/24/2017 at 900a  . mirtazapine (REMERON) 15 MG tablet Take 7.5 mg by mouth at bedtime.   09/23/2017 at Unknown time  . Nutritional Supplements (FEEDING SUPPLEMENT, GLUCERNA 1.2 CAL,) LIQD Glucerna 1.2 cal by peg tube Three times a day flush with 30 ml free water before and after administering full volume of TF.    09/24/2017 at Unknown time  . pantoprazole sodium (PROTONIX) 40 mg/20 mL PACK Place 20 mLs (40 mg total) into feeding tube daily. 30 each  09/24/2017 at Unknown time  . polyethylene glycol (MIRALAX / GLYCOLAX) packet Place 17 g into feeding tube daily.   09/24/2017 at Unknown time  . Water For Irrigation, Sterile (FREE WATER) SOLN Place 300 mLs into feeding tube every 4 (four) hours. Keep HOB elevated at 45 degrees at all times during feedings, flushings, and medications 12a, 4a, 8a, 12p, 4p,8p   unknown  . witch hazel-glycerin (TUCKS) pad Apply 1 application topically as needed for itching.   unknown  . zoledronic acid (RECLAST) 5 MG/100ML SOLN injection Inject 5 mg into the vein once.   09/13/2017 at Unknown time  . acetaminophen (TYLENOL) 325 MG tablet Place 650 mg into feeding tube every 4 (four) hours as needed.   unknown   Scheduled: . chlorhexidine  15 mL Mouth Rinse BID  . Chlorhexidine Gluconate Cloth  6 each Topical Daily  . feeding supplement (ENSURE ENLIVE)  237 mL Oral BID BM  . insulin aspart  0-15 Units Subcutaneous Q4H  . insulin detemir  34 Units Subcutaneous Daily  . pantoprazole (PROTONIX) IV  40 mg Intravenous Q24H  . polyethylene glycol  17 g Per Tube Daily  . sodium chloride flush  10-40 mL Intracatheter Q12H  . sodium chloride flush  3 mL Intravenous Q12H   Continuous: . sodium chloride 125 mL/hr (09/25/17 0018)  . ceFEPime (MAXIPIME) IV Stopped (09/25/17 1007)  . metronidazole Stopped (09/25/17 1145)  . norepinephrine (LEVOPHED) Adult infusion 7 mcg/min (09/25/17 1146)  . vancomycin 1,000 mg (09/25/17 0522)   TKW:[id number] **OR** acetaminophen, bisacodyl, docusate, hydrALAZINE, ondansetron **OR** ondansetron (ZOFRAN) IV, sodium chloride flush  Allergies: Allergies  Allergen Reactions  . Penicillins Hives    ROS:  A comprehensive review of systems was negative except for: Constitutional: positive for malaise Gastrointestinal: positive for abdominal pain and  buttock/ rectal pain     Blood pressure 101/83, pulse 99, temperature 98.1 F (36.7 C), temperature source Oral, resp. rate (!) 26, height '5\' 6"'$  (1.676 m), weight 179 lb 3.7 oz (81.3 kg), SpO2 98 %. Physical Exam  Constitutional: He is well-developed, well-nourished, and in no distress.  HENT:  Head: Normocephalic.  Eyes: Pupils are equal, round, and reactive to light.  Neck: Normal range of motion.  Cardiovascular: Normal rate.  Abdominal: Soft. He exhibits no distension. There is no tenderness.  PEG in place with no drainage, moves freely   Genitourinary:  Genitourinary Comments: Rectal exam deferred due to pain, left gluteal area with ruptured abscess, about 1inch size site of rupture, copious purulence expressed, cavity interrogated with finger and loculations broken up, right perirectal area/ glute without induration or fluctuance that is obvious  Musculoskeletal: Normal range of motion.  Vitals reviewed.   Results: Results  for orders placed or performed during the hospital encounter of 09/24/17 (from the past 48 hour(s))  Influenza panel by PCR (type A & B)     Status: None   Collection Time: 09/24/17  3:15 PM  Result Value Ref Range   Influenza A By PCR NEGATIVE NEGATIVE   Influenza B By PCR NEGATIVE NEGATIVE    Comment: (NOTE) The Xpert Xpress Flu assay is intended as an aid in the diagnosis of  influenza and should not be used as a sole basis for treatment.  This  assay is FDA approved for nasopharyngeal swab specimens only. Nasal  washings and aspirates are unacceptable for Xpert Xpress Flu testing. Performed at [facility], [address] [place], [place] [address]   Comprehensive metabolic panel     Status: Abnormal   Collection Time: 09/24/17  3:27 PM  Result Value Ref Range   Sodium 140 135 - 145 mmol/L   Potassium 4.2 3.5 - 5.1 mmol/L   Chloride 103 101 - 111 mmol/L   CO2 26 22 - 32 mmol/L   Glucose, Bld 141 (H) 65 - 99 mg/dL   BUN 24 (H) 6 - 20 mg/dL    Creatinine, Ser 0.90 0.61 - 1.24 mg/dL   Calcium 9.2 8.9 - 10.3 mg/dL   Total Protein 8.3 (H) 6.5 - 8.1 g/dL   Albumin 2.6 (L) 3.5 - 5.0 g/dL   AST 34 15 - 41 U/L   ALT 59 17 - 63 U/L   Alkaline Phosphatase 102 38 - 126 U/L   Total Bilirubin 1.0 0.3 - 1.2 mg/dL   GFR calc non Af Amer >60 >60 mL/min   GFR calc Af Amer >60 >60 mL/min    Comment: (NOTE) The eGFR has been calculated using the CKD EPI equation. This calculation has not been validated in all clinical situations. eGFR's persistently <60 mL/min signify possible Chronic Kidney Disease.    Anion gap 11 5 - 15    Comment: Performed at [facility], [address] [place], [place] [address]  CBC with Differential     Status: Abnormal   Collection Time: 09/24/17  3:27 PM  Result Value Ref Range   WBC 23.8 (H) 4.0 - 10.5 K/uL   RBC 4.29 4.22 - 5.81 MIL/uL   Hemoglobin 11.9 (L) 13.0 - 17.0 g/dL   HCT 38.9 (L) 39.0 - 52.0 %   MCV 90.7 78.0 - 100.0 fL   MCH 27.7 26.0 - 34.0 pg   MCHC 30.6 30.0 - 36.0 g/dL   RDW 15.1 11.5 - 15.5 %   Platelets 271 150 - 400 K/uL   Neutrophils Relative % 80 %   Neutro Abs 19.0 (H) 1.7 - 7.7 K/uL   Lymphocytes Relative 6 %   Lymphs Abs 1.4 0.7 - 4.0 K/uL   Monocytes Relative 13 %   Monocytes Absolute 3.2 (H) 0.1 - 1.0 K/uL   Eosinophils Relative 1 %   Eosinophils Absolute 0.2 0.0 - 0.7 K/uL   Basophils Relative 0 %   Basophils Absolute 0.1 0.0 - 0.1 K/uL    Comment: Performed at [facility], [address], [place], [place] [address]  Protime-INR     Status: Abnormal   Collection Time: 09/24/17  3:27 PM  Result Value Ref Range   Prothrombin Time 16.2 (H) 11.4 - 15.2 seconds   INR 1.31     Comment: Performed at [facility], [address], [place], [place] [address]  Phosphorus     Status: Abnormal  Collection Time: 09/24/17  3:27 PM  Result Value Ref Range   Phosphorus 2.2 (L) 2.5 - 4.6 mg/dL    Comment: Performed at [facility], [address], [place], [place] [address]  Lipase,  blood     Status: None   Collection Time: 09/24/17  3:27 PM  Result Value Ref Range   Lipase 35 11 - 51 U/L    Comment: Performed at [facility], [address], [place], [place] [address]  Culture, blood (Routine x 2)     Status: None (Preliminary result)   Collection Time: 09/24/17  3:29 PM  Result Value Ref Range   Specimen Description      BLOOD LEFT ANTECUBITAL Blood Culture adequate volume BOTTLES DRAWN AEROBIC ONLY   Special Requests NONE    Culture      NO GROWTH < 24 HOURS Performed at [facility], [address] [place], [place] [address]    Report Status PENDING   I-Stat CG4 Lactic Acid, ED     Status: None   Collection Time: 09/24/17  3:43 PM  Result Value Ref Range   Lactic Acid, Venous 1.55 0.5 - 1.9 mmol/L  Urinalysis, Routine w reflex microscopic     Status: Abnormal   Collection Time: 09/24/17  5:49 PM  Result Value Ref Range   Color, Urine YELLOW YELLOW   APPearance CLEAR CLEAR   Specific Gravity, Urine 1.020 1.005 - 1.030   pH 6.0 5.0 - 8.0   Glucose, UA NEGATIVE NEGATIVE mg/dL   Hgb urine dipstick NEGATIVE NEGATIVE   Bilirubin Urine NEGATIVE NEGATIVE   Ketones, ur NEGATIVE NEGATIVE mg/dL   Protein, ur >=300 (A) NEGATIVE mg/dL   Nitrite NEGATIVE NEGATIVE   Leukocytes, UA NEGATIVE NEGATIVE   RBC / HPF 6-30 0 - 5 RBC/hpf   WBC, UA 0-5 0 - 5 WBC/hpf   Bacteria, UA NONE SEEN NONE SEEN   Squamous Epithelial / LPF 0-5 (A) NONE SEEN   Mucus PRESENT     Comment: Performed at [facility], [address], [place], [place] [address]  CBG monitoring, ED     Status: None   Collection Time: 09/24/17  8:43 PM  Result Value Ref Range   Glucose-Capillary 95 65 - 99 mg/dL  CBG monitoring, ED     Status: Abnormal   Collection Time: 09/24/17  9:29 PM  Result Value Ref Range   Glucose-Capillary 107 (H) 65 - 99 mg/dL  MRSA PCR Screening     Status: None   Collection Time: 09/24/17 11:30 PM  Result Value Ref Range   MRSA by PCR NEGATIVE NEGATIVE    Comment:         The GeneXpert MRSA Assay (FDA approved for NASAL specimens only), is one component of a comprehensive MRSA colonization surveillance program. It is not intended to diagnose MRSA infection nor to guide or monitor treatment for MRSA infections. Performed at [facility], [address] [place],  [address]   Glucose, capillary     Status: None   Collection Time: 09/25/17 12:26 AM  Result Value Ref Range   Glucose-Capillary 96 65 - 99 mg/dL   Comment 1 Notify RN   Glucose, capillary     Status: Abnormal   Collection Time: 09/25/17  4:13 AM  Result Value Ref Range   Glucose-Capillary 120 (H) 65 - 99 mg/dL   Comment 1 Notify RN   Basic metabolic panel     Status: Abnormal   Collection Time: 09/25/17  5:31 AM  Result Value Ref Range   Sodium 141 135 - 145 mmol/L   Potassium 3.4 (L) 3.5 - 5.1 mmol/L    Comment: DELTA CHECK NOTED   Chloride 112 (H) 101 - 111 mmol/L   CO2 19 (L) 22 - 32 mmol/L   Glucose, Bld 146 (H) 65 - 99 mg/dL   BUN 13 6 - 20 mg/dL   Creatinine, Ser 0.69 0.61 - 1.24 mg/dL   Calcium 8.0 (L) 8.9 - 10.3 mg/dL   GFR calc non Af Amer >60 >60 mL/min   GFR calc Af Amer >60 >60 mL/min    Comment: (NOTE) The eGFR has been calculated using the CKD EPI equation. This calculation has not been validated in all clinical situations. eGFR's persistently <60 mL/min signify possible Chronic Kidney Disease.    Anion gap 10 5 - 15    Comment: Performed at [facility], [address] [place], [place] [address]  CBC     Status: Abnormal   Collection Time: 09/25/17  5:31 AM  Result Value Ref Range   WBC 25.0 (H) 4.0 - 10.5 K/uL   RBC 3.69 (L) 4.22 - 5.81 MIL/uL   Hemoglobin 10.1 (L) 13.0 - 17.0 g/dL   HCT 33.7 (L) 39.0 - 52.0 %   MCV 91.3 78.0 - 100.0 fL   MCH 27.4 26.0 - 34.0 pg   MCHC 30.0 30.0 - 36.0 g/dL   RDW 15.4 11.5 - 15.5 %   Platelets 290 150 - 400 K/uL    Comment: Performed at [facility], [address] [place] [address], [place], [place] [address]  Magnesium      Status: None   Collection Time: 09/25/17  5:45 AM  Result Value Ref Range   Magnesium 2.1 1.7 - 2.4 mg/dL    Comment: Performed at [facility], [address] [place], [place] [address]  Glucose, capillary     Status: Abnormal   Collection Time: 09/25/17  7:57 AM  Result Value Ref Range   Glucose-Capillary 157 (H) 65 - 99 mg/dL  Glucose, capillary     Status: Abnormal   Collection Time: 09/25/17  9:21 AM  Result Value Ref Range   Glucose-Capillary 148 (H) 65 - 99 mg/dL  Glucose, capillary     Status: Abnormal   Collection Time: 09/25/17 11:17 AM  Result Value Ref Range   Glucose-Capillary 142 (H) 65 - 99 mg/dL    CT personally reviewed large left gluteal abscess, partially visualized, right gluteal fluid collection? And posterior collection but this appears to connect with the let; ? Possibly horseshoe abscess with prominent portion on left? Difficult to say with imaging   Ct Abdomen Pelvis W Contrast  Result Date: 09/24/2017 CLINICAL DATA:  Abdominal pain at PEG tube site after pulling at out accidentally. Weakness and increased pain to right-sided flank. EXAM: CT ABDOMEN AND PELVIS WITH CONTRAST TECHNIQUE: Multidetector CT imaging of the abdomen and pelvis was performed using the standard protocol following bolus administration of intravenous contrast. CONTRAST:  151m ISOVUE-300 IOPAMIDOL (ISOVUE-300) INJECTION 61% COMPARISON:  CT abdomen dated 04/22/2017. FINDINGS: Lower chest: No acute abnormality. Hepatobiliary: Stable hemangioma within the left liver lobe. No acute or suspicious abnormality within the liver. Gallbladder is not seen, presumed cholecystectomy. No bile duct dilatation. Pancreas: Unremarkable. No pancreatic ductal dilatation or surrounding inflammatory changes. Spleen: Normal in size without focal abnormality. Adrenals/Urinary Tract: Adrenal glands appear normal. Kidneys appear normal without mass, stone or hydronephrosis. Bladder appears normal. Stomach/Bowel: Worsening  appearance of the perianal soft tissues. There is  now a defined abscess collection to the left of the gluteal fold, perianal, measuring at least 6.7 x 3.4 cm, incompletely imaged at the lower aspects of the exam. Smaller collection to the right of the gluteal fold, measuring 2.3 cm, also incompletely imaged. An additional 1.9 cm collection more centrally near the anorectal junction. Associated thickening of the walls of the lower rectum. No dilated large or small bowel loops. Appendix is normal. Per clinical data, Foley catheter tube in place of the PEG tube, well positioned in the stomach. No fluid collection or evidence of inflammatory change at the catheter entrance site. Vascular/Lymphatic: No significant vascular findings are present. No enlarged abdominal or pelvic lymph nodes. Reproductive: Mild prominence of the prostate gland causing slight mass effect on the bladder base. Other: No free intraperitoneal air. Musculoskeletal: No acute or suspicious osseous finding. Mild degenerative change throughout the thoracolumbar spine. IMPRESSION: 1. Per report, Foley catheter was placed after PEG tube was accidentally removed. The Foley catheter appears well positioned in the stomach. No fluid collection, inflammatory change or other procedural complicating feature at the PEG tube insertion site. 2. WORSENED appearance of the PERIANAL soft tissues. There is now a defined abscess collection to the left of the gluteal fold, perianal, measuring at least 6.7 cm greatest dimension, but incompletely imaged at the lower aspects of the exam. Smaller abscess collection to the right of the gluteal fold measures 2.3 cm, also incompletely imaged. Additional 1.9 cm abscess collection more centrally near the anal rectal junction. Associated thickening of the walls of the lower rectum, with surrounding perirectal inflammation, but no extension of the defined abscess collection into the perirectal fat of the lower pelvis. 3.  Additional chronic/incidental findings detailed above. Electronically Signed   By: Franki Cabot M.D.   On: 09/24/2017 22:29   Dg Chest Portable 1 View  Result Date: 09/24/2017 CLINICAL DATA:  Fever. EXAM: PORTABLE CHEST 1 VIEW COMPARISON:  05/26/2017 FINDINGS: The heart size and mediastinal contours are within normal limits. Both lungs are clear. The visualized skeletal structures are unremarkable. IMPRESSION: No active disease. Electronically Signed   By: Kerby Moors M.D.   On: 09/24/2017 16:15    Assessment & Plan:  Kayston Jodoin is a 57 y.o. male with a history of stroke, requiring right decompressive craniectomy, PEG placement 04/2017 by GI.  He is still requiring supplemental feeds at the SNF per his report and gets "Milk" likely tube feeds at the SNF during the night.  - PEG ok to use, foley is just as good as PEG tube, and can be as it is confirmed on on CT scan to be in the stomach - Would not remove the PEG until:   1.) Speech verifies patient is safe to take in food by mouth  2.) Patient has trial of po intake that is adequate to sustain his  nutrition/ hydration  - If patient completes the above, the the PEG can be removed at the bedside and allowed to heal secondarily  -Left gluteal abscess already draining, verbal consent obtained from the patient to break up loculations and place a Penrose drain  - Will leave the drain in place for a few days to allow for adequate drainage - IV antibiotics  - Will possibly need repeat CT scan of the pelvis to verify complete drainage of the large area   All questions were answered to the satisfaction of the patient. He gave verbal consent to have the Penrose placed.   Virl Cagey 09/25/2017,  1:24 PM

## 2017-09-25 NOTE — Progress Notes (Signed)
Dr. Manuella Ghazi wanted to hold meds from earlier that went into peg/foley tube until general surgery evaluated the area. Dr. Constance Haw mentioned the area was good to go and that CT had verified placement of Peg/Foley tube. She wanted to hold off on removing it though until speech was consulted and seen the patient. He states [facility] but Dr. Constance Haw couldn't verify if he eats enough by mouth. Dr. Manuella Ghazi states that we can go ahead and start using the Peg/foley tube now and push meds /feeding supp through it.   Abigaile Rossie Rica Mote, RN

## 2017-09-25 NOTE — Evaluation (Signed)
Clinical/Bedside Swallow Evaluation Patient Details  Name: Joe Wilkinson MRN: [id number] Date of Birth: Sep 20, 1960  Today's Date: 09/25/2017 Time: SLP Start Time (ACUTE ONLY): 2017 SLP Stop Time (ACUTE ONLY): 2038 SLP Time Calculation (min) (ACUTE ONLY): 21 min  Past Medical History:  Past Medical History:  Diagnosis Date  . DM (diabetes mellitus) ([place])   . Hypertension   . Stroke [facility])    Past Surgical History:  Past Surgical History:  Procedure Laterality Date  . CRANIECTOMY Right 04/01/2017   Procedure: RIGHT DECOMPRESSIVE CRANIECTOMY;  Surgeon: Ditty, Kevan Ny, MD;  Location: [place];  Service: Neurosurgery;  Laterality: Right;  . ESOPHAGOGASTRODUODENOSCOPY N/A 04/13/2017   Procedure: ESOPHAGOGASTRODUODENOSCOPY (EGD);  Surgeon: Georganna Skeans, MD;  Location: [facility];  Service: General;  Laterality: N/A;  bedside  . PEG PLACEMENT N/A 04/13/2017   Procedure: PERCUTANEOUS ENDOSCOPIC GASTROSTOMY (PEG) PLACEMENT;  Surgeon: Georganna Skeans, MD;  Location: [facility] ENDOSCOPY;  Service: General;  Laterality: N/A;   HPI:  This is a 57 year old male with history of prior CVA and left-sided hemiparesis along with PEG tube, diabetes, and hypertension who was brought to the ED due to "feeling bad."  He apparently pulled his PEG tube out which was replaced with a Foley catheter.  He was placed on vancomycin and cefepime for sepsis that appears to be associated to a left gluteal abscess.  IV Flagyl has been added this morning for better anaerobic coverage.  This was evaluated by general surgery earlier today and the patient began to spontaneously drain and had some debridement of loculations with a Penrose drain placed.  He has been recommended to have a speech evaluation performed on account of the fact that he does have some oral intake.  He continues to remain on norepinephrine for pressor support.   Assessment / Plan / Recommendation Clinical Impression  Clinical swallow evaluation completed at  bedside at request of MD and RN who report that Pt is asking for something to eat. Epic review reveals that Pt had MBSS in September 2018 with recommendation for puree and thin liquids. Pt reportedly has been eating purees and thins at [facility], however unable to confirm intake at this time. Pt without overt signs or symptoms aspiration of thins and puree presented this date; Pt with mild left buccal pocketing with solids and benefited from liquid wash. Recommend D1/puree and thin liquids and SLP to contact SLP at [facility] to determine his typical intake there.    SLP Visit Diagnosis: Dysphagia, oral phase (R13.11)    Aspiration Risk  Mild aspiration risk    Diet Recommendation Thin liquid;Dysphagia 1 (Puree)   Liquid Administration via: Cup;Straw Medication Administration: Crushed with puree Supervision: Full supervision/cueing for compensatory strategies;Patient able to self feed Compensations: Minimize environmental distractions;Follow solids with liquid;Monitor for anterior loss;Lingual sweep for clearance of pocketing;Small sips/bites Postural Changes: Seated upright at 90 degrees;Remain upright for at least 30 minutes after po intake    Other  Recommendations Oral Care Recommendations: Oral care BID   Follow up Recommendations Skilled Nursing facility      Frequency and Duration min 2x/week  2 weeks       Prognosis Prognosis for Safe Diet Advancement: Good Barriers to Reach Goals: Time post onset      Swallow Study   General Date of Onset: 09/24/17 HPI: This is a 57 year old male with history of prior CVA and left-sided hemiparesis along with PEG tube, diabetes, and hypertension who was brought to the ED due to "feeling bad."  He apparently pulled  his PEG tube out which was replaced with a Foley catheter.  He was placed on vancomycin and cefepime for sepsis that appears to be associated to a left gluteal abscess.  IV Flagyl has been added this morning for better anaerobic coverage.  This  was evaluated by general surgery earlier today and the patient began to spontaneously drain and had some debridement of loculations with a Penrose drain placed.  He has been recommended to have a speech evaluation performed on account of the fact that he does have some oral intake.  He continues to remain on norepinephrine for pressor support. Type of Study: Bedside Swallow Evaluation Previous Swallow Assessment: MBSS Sept D1/puree and thin Diet Prior to this Study: NPO Temperature Spikes Noted: No Respiratory Status: Room air Behavior/Cognition: Alert;Cooperative;Pleasant mood Oral Cavity Assessment: Within Functional Limits Oral Care Completed by SLP: Recent completion by staff Oral Cavity - Dentition: Adequate natural dentition;Missing dentition Vision: Functional for self-feeding Self-Feeding Abilities: Needs set up Patient Positioning: Upright in bed Baseline Vocal Quality: Normal Volitional Cough: Weak Volitional Swallow: Able to elicit    Oral/Motor/Sensory Function Overall Oral Motor/Sensory Function: Mild impairment Facial ROM: Reduced left Facial Symmetry: Abnormal symmetry left   Ice Chips Ice chips: Within functional limits Presentation: Spoon   Thin Liquid Thin Liquid: Within functional limits Presentation: Straw    Nectar Thick Nectar Thick Liquid: Not tested   Honey Thick Honey Thick Liquid: Not tested   Puree Puree: Impaired Presentation: Spoon Oral Phase Impairments: Reduced lingual movement/coordination Oral Phase Functional Implications: Prolonged oral transit   Solid   Thank you,  Genene Churn, CCC-SLP [phone]    Solid: Impaired Oral Phase Functional Implications: Prolonged oral transit;Oral residue        Joe Wilkinson 09/25/2017,8:45 PM

## 2017-09-25 NOTE — Progress Notes (Signed)
PROGRESS NOTE    Joe Wilkinson  GHW:[id number] DOB: 31-Dec-1960 DOA: 09/24/2017 PCP: Virgie Dad, MD   Brief Narrative:   This is a 57 year old male with history of prior CVA and left-sided hemiparesis along with PEG tube, diabetes, and hypertension who was brought to the ED due to "feeling bad."  He apparently pulled his PEG tube out which was replaced with a Foley catheter.  He was placed on vancomycin and cefepime for sepsis that appears to be associated to a left gluteal abscess.  IV Flagyl has been added this morning for better anaerobic coverage.  This was evaluated by general surgery earlier today and the patient began to spontaneously drain and had some debridement of loculations with a Penrose drain placed.  He has been recommended to have a speech evaluation performed on account of the fact that he does have some oral intake.  He continues to remain on norepinephrine for pressor support.  Assessment & Plan:   Active Problems:   Sepsis ([place])   1. Septic shock likely secondary to left-sided gluteal abscess.  Blood culture thus far has remained negative.  Appreciate general surgery evaluation for I&D.  Culture from abscess is currently pending.  Continue antibiotics for now and reassess clinically for need of repeat CT.  Repeat lactic acid level in a.m.  Continue to wean norepinephrine as tolerated.  Continue IV fluid. 2. Displaced PEG tube.  Continue with Foley catheter at PEG site.  This can be used as needed for feeding.  Will order speech/swallow evaluation and decide whether or not this will be needed in the near future. 3. Hypertension-currently hypotensive.  Hold blood pressure medications. 4. Diabetes.  Good blood glucose control noted thus far.  Continue current management. 5. Prior CVA.  Continue on Eliquis once swallow eval completed.   DVT prophylaxis:SCDs Code Status: Full Family Communication: None Disposition Plan: TBD   Consultants:   General  Surgery  Procedures:   L gluteal abscess bedside I/D 2/18  Antimicrobials:   Vancomycin and Cefepime 2/17->  Flagyl 2/18->   Subjective: Patient seen and evaluated today with no new acute complaints or concerns. No acute concerns or events noted overnight.  Objective: Vitals:   09/25/17 1100 09/25/17 1110 09/25/17 1120 09/25/17 1236  BP: (!) 93/38 (!) 84/58 101/83   Pulse: (!) 122 99 99   Resp: (!) 26 (!) 28 (!) 26   Temp:    98.1 F (36.7 C)  TempSrc:    Oral  SpO2: 95% 97% 98%   Weight:      Height:        Intake/Output Summary (Last 24 hours) at 09/25/2017 1624 Last data filed at 09/25/2017 0500 Gross per 24 hour  Intake 5763.73 ml  Output 850 ml  Net 4913.73 ml   Filed Weights   09/24/17 1716 09/24/17 2343 09/25/17 0500  Weight: 81.6 kg (180 lb) 81.3 kg (179 lb 3.7 oz) 81.3 kg (179 lb 3.7 oz)    Examination:  General exam: Appears calm and comfortable  Respiratory system: Clear to auscultation. Respiratory effort normal. Cardiovascular system: S1 & S2 heard, RRR. No JVD, murmurs, rubs, gallops or clicks. No pedal edema. Gastrointestinal system: Abdomen is nondistended, soft and nontender. No organomegaly or masses felt. Normal bowel sounds heard. Central nervous system: Alert and oriented. No focal neurological deficits. Extremities: Symmetric 5 x 5 power. Skin: No rashes, lesions or ulcers Psychiatry: Judgement and insight appear normal. Mood & affect appropriate.     Data Reviewed: I have  personally reviewed following labs and imaging studies  CBC: Recent Labs  Lab 09/24/17 1527 09/25/17 0531  WBC 23.8* 25.0*  NEUTROABS 19.0*  --   HGB 11.9* 10.1*  HCT 38.9* 33.7*  MCV 90.7 91.3  PLT 271 161   Basic Metabolic Panel: Recent Labs  Lab 09/24/17 1527 09/25/17 0531 09/25/17 0545  NA 140 141  --   K 4.2 3.4*  --   CL 103 112*  --   CO2 26 19*  --   GLUCOSE 141* 146*  --   BUN 24* 13  --   CREATININE 0.90 0.69  --   CALCIUM 9.2 8.0*  --    MG  --   --  2.1  PHOS 2.2*  --   --    GFR: Estimated Creatinine Clearance: 103.3 mL/min (by C-G formula based on SCr of 0.69 mg/dL). Liver Function Tests: Recent Labs  Lab 09/24/17 1527  AST 34  ALT 59  ALKPHOS 102  BILITOT 1.0  PROT 8.3*  ALBUMIN 2.6*   Recent Labs  Lab 09/24/17 1527  LIPASE 35   No results for input(s): AMMONIA in the last 168 hours. Coagulation Profile: Recent Labs  Lab 09/24/17 1527  INR 1.31   Cardiac Enzymes: No results for input(s): CKTOTAL, CKMB, CKMBINDEX, TROPONINI in the last 168 hours. BNP (last 3 results) No results for input(s): PROBNP in the last 8760 hours. HbA1C: No results for input(s): HGBA1C in the last 72 hours. CBG: Recent Labs  Lab 09/25/17 0026 09/25/17 0413 09/25/17 0757 09/25/17 0921 09/25/17 1117  GLUCAP 96 120* 157* 148* 142*   Lipid Profile: No results for input(s): CHOL, HDL, LDLCALC, TRIG, CHOLHDL, LDLDIRECT in the last 72 hours. Thyroid Function Tests: No results for input(s): TSH, T4TOTAL, FREET4, T3FREE, THYROIDAB in the last 72 hours. Anemia Panel: No results for input(s): VITAMINB12, FOLATE, FERRITIN, TIBC, IRON, RETICCTPCT in the last 72 hours. Sepsis Labs: Recent Labs  Lab 09/24/17 1543  LATICACIDVEN 1.55    Recent Results (from the past 240 hour(s))  Culture, blood (Routine x 2)     Status: None (Preliminary result)   Collection Time: 09/24/17  3:29 PM  Result Value Ref Range Status   Specimen Description   Final    BLOOD LEFT ANTECUBITAL Blood Culture adequate volume BOTTLES DRAWN AEROBIC ONLY   Special Requests NONE  Final   Culture   Final    NO GROWTH < 24 HOURS Performed at [facility], [address] [place], [place] [address]    Report Status PENDING  Incomplete  MRSA PCR Screening     Status: None   Collection Time: 09/24/17 11:30 PM  Result Value Ref Range Status   MRSA by PCR NEGATIVE NEGATIVE Final    Comment:        The GeneXpert MRSA Assay (FDA approved for NASAL  specimens only), is one component of a comprehensive MRSA colonization surveillance program. It is not intended to diagnose MRSA infection nor to guide or monitor treatment for MRSA infections. Performed at [facility], [address] [place], [place] [address]          Radiology Studies: Ct Abdomen Pelvis W Contrast  Result Date: 09/24/2017 CLINICAL DATA:  Abdominal pain at PEG tube site after pulling at out accidentally. Weakness and increased pain to right-sided flank. EXAM: CT ABDOMEN AND PELVIS WITH CONTRAST TECHNIQUE: Multidetector CT imaging of the abdomen and pelvis was performed using the standard protocol following bolus administration of intravenous contrast. CONTRAST:  163mL ISOVUE-300 IOPAMIDOL (ISOVUE-300) INJECTION 61% COMPARISON:  CT abdomen dated 04/22/2017. FINDINGS: Lower chest: No acute abnormality. Hepatobiliary: Stable hemangioma within the left liver lobe. No acute or suspicious abnormality within the liver. Gallbladder is not seen, presumed cholecystectomy. No bile duct dilatation. Pancreas: Unremarkable. No pancreatic ductal dilatation or surrounding inflammatory changes. Spleen: Normal in size without focal abnormality. Adrenals/Urinary Tract: Adrenal glands appear normal. Kidneys appear normal without mass, stone or hydronephrosis. Bladder appears normal. Stomach/Bowel: Worsening appearance of the perianal soft tissues. There is now a defined abscess collection to the left of the gluteal fold, perianal, measuring at least 6.7 x 3.4 cm, incompletely imaged at the lower aspects of the exam. Smaller collection to the right of the gluteal fold, measuring 2.3 cm, also incompletely imaged. An additional 1.9 cm collection more centrally near the anorectal junction. Associated thickening of the walls of the lower rectum. No dilated large or small bowel loops. Appendix is normal. Per clinical data, Foley catheter tube in place of the PEG tube, well positioned in the stomach. No  fluid collection or evidence of inflammatory change at the catheter entrance site. Vascular/Lymphatic: No significant vascular findings are present. No enlarged abdominal or pelvic lymph nodes. Reproductive: Mild prominence of the prostate gland causing slight mass effect on the bladder base. Other: No free intraperitoneal air. Musculoskeletal: No acute or suspicious osseous finding. Mild degenerative change throughout the thoracolumbar spine. IMPRESSION: 1. Per report, Foley catheter was placed after PEG tube was accidentally removed. The Foley catheter appears well positioned in the stomach. No fluid collection, inflammatory change or other procedural complicating feature at the PEG tube insertion site. 2. WORSENED appearance of the PERIANAL soft tissues. There is now a defined abscess collection to the left of the gluteal fold, perianal, measuring at least 6.7 cm greatest dimension, but incompletely imaged at the lower aspects of the exam. Smaller abscess collection to the right of the gluteal fold measures 2.3 cm, also incompletely imaged. Additional 1.9 cm abscess collection more centrally near the anal rectal junction. Associated thickening of the walls of the lower rectum, with surrounding perirectal inflammation, but no extension of the defined abscess collection into the perirectal fat of the lower pelvis. 3. Additional chronic/incidental findings detailed above. Electronically Signed   By: Franki Cabot M.D.   On: 09/24/2017 22:29   Dg Chest Portable 1 View  Result Date: 09/24/2017 CLINICAL DATA:  Fever. EXAM: PORTABLE CHEST 1 VIEW COMPARISON:  05/26/2017 FINDINGS: The heart size and mediastinal contours are within normal limits. Both lungs are clear. The visualized skeletal structures are unremarkable. IMPRESSION: No active disease. Electronically Signed   By: Kerby Moors M.D.   On: 09/24/2017 16:15        Scheduled Meds: . chlorhexidine  15 mL Mouth Rinse BID  . Chlorhexidine Gluconate  Cloth  6 each Topical Daily  . feeding supplement (ENSURE ENLIVE)  237 mL Oral BID BM  . insulin aspart  0-15 Units Subcutaneous Q4H  . insulin detemir  34 Units Subcutaneous Daily  . pantoprazole (PROTONIX) IV  40 mg Intravenous Q24H  . polyethylene glycol  17 g Per Tube Daily  . sodium chloride flush  10-40 mL Intracatheter Q12H  . sodium chloride flush  3 mL Intravenous Q12H   Continuous Infusions: . sodium chloride 125 mL/hr (09/25/17 0018)  . ceFEPime (MAXIPIME) IV Stopped (09/25/17 1007)  . metronidazole Stopped (09/25/17 1145)  . norepinephrine (LEVOPHED) Adult infusion 8 mcg/min (09/25/17 1350)  . vancomycin 1,000 mg (09/25/17 0522)  LOS: 1 day    Time spent: 30 minutes    Norva Bowe Darleen Crocker, DO [facility] Pager [phone]  If 7PM-7AM, please contact night-coverage [email or web address] Password [facility] 09/25/2017, 4:24 PM

## 2017-09-25 NOTE — Clinical Social Work Note (Signed)
Clinical Social Work Assessment  Patient Details  Name: Joe Wilkinson MRN: [id number] Date of Birth: 03-25-1961  Date of referral:  09/25/17               Reason for consult:  Discharge Planning                Permission sought to share information with:  Other Permission granted to share information::  Yes, Verbal Permission Granted  Name::        Agency::  [facility]  Relationship::     Contact Information:     Housing/Transportation Living arrangements for the past 2 months:  [place] of Information:  Facility Patient Interpreter Needed:  None Criminal Activity/Legal Involvement Pertinent to Current Situation/Hospitalization:  No - Comment as needed Significant Relationships:  None Lives with:  Facility Resident Do you feel safe going back to the place where you live?  Yes Need for family participation in patient care:  No (Coment)  Care giving concerns: Pt resides in LTC at [facility].   Social Worker assessment / plan: Pt is a 58 year old male admitted from [facility]. Pt has been a LTC resident at [facility] since October 2018. He was a difficult to place pt that [facility] accepted from [facility]. Pt has a mother and a sister but they are not involved. Plan is for return to [facility] upon dc.  Employment status:  Disabled (Comment on whether or not currently receiving Disability) Insurance information:  Medicaid In [place] PT Recommendations:  Not assessed at this time Information / Referral to community resources:     Patient/Family's Response to care: Pt accepting of care.  Patient/Family's Understanding of and Emotional Response to Diagnosis, Current Treatment, and Prognosis: It appears pt has a good understanding of diagnosis and treatment needs. No emotional distress identified.  Emotional Assessment Appearance:  Appears stated age Attitude/Demeanor/Rapport:  Engaged Affect (typically observed):  Pleasant Orientation:  Oriented to Self, Oriented to Place Alcohol / Substance use:  Not  Applicable Psych involvement (Current and /or in the community):  No (Comment)  Discharge Needs  Concerns to be addressed:  Discharge Planning Concerns Readmission within the last 30 days:  No Current discharge risk:  None Barriers to Discharge:  No Barriers Identified   Shade Flood, LCSW 09/25/2017, 12:15 PM

## 2017-09-25 NOTE — Progress Notes (Signed)
ANTIBIOTIC CONSULT NOTE-Preliminary  Pharmacy Consult for Vancomycin Indication: sepsis  Allergies  Allergen Reactions  . Penicillins Hives    Patient Measurements: Height: 5\' 6"  (167.6 cm) Weight: 179 lb 3.7 oz (81.3 kg) IBW/kg (Calculated) : 63.8 kg   Vital Signs: Temp: 100.8 F (38.2 C) (02/17 2343) Temp Source: Oral (02/17 2343) BP: 99/86 (02/17 2245) Pulse Rate: 109 (02/17 2343)  Labs: Recent Labs    09/24/17 1527  WBC 23.8*  HGB 11.9*  PLT 271  CREATININE 0.90    Estimated Creatinine Clearance: 91.8 mL/min (by C-G formula based on SCr of 0.9 mg/dL).   Microbiology: Recent Results (from the past 720 hour(s))  Culture, blood (Routine x 2)     Status: None (Preliminary result)   Collection Time: 09/24/17  3:29 PM  Result Value Ref Range Status   Specimen Description   Final    BLOOD LEFT ANTECUBITAL Blood Culture adequate volume BOTTLES DRAWN AEROBIC ONLY   Special Requests   Final    NONE Performed at [facility], [address] [place], [place] [address]    Culture PENDING  Incomplete   Report Status PENDING  Incomplete    Medical History: Past Medical History:  Diagnosis Date  . DM (diabetes mellitus) ([place])   . Hypertension   . Stroke [facility])     Medications:  Anti-infectives (From admission, onward)   Start     Dose/Rate Route Frequency Ordered Stop   09/25/17 2200  ceFEPIme (MAXIPIME) 500 mg in dextrose 5 % 50 mL IVPB  Status:  Discontinued     500 mg 100 mL/hr over 30 Minutes Intravenous Every 8 hours 09/24/17 1920 09/25/17 0134   09/25/17 0500  vancomycin (VANCOCIN) IVPB 1000 mg/200 mL premix     1,000 mg 200 mL/hr over 60 Minutes Intravenous Every 12 hours 09/25/17 0204     09/25/17 0230  ceFEPIme (MAXIPIME) 1 g in sodium chloride 0.9 % 100 mL IVPB     1 g 200 mL/hr over 30 Minutes Intravenous Every 8 hours 09/25/17 0159     09/25/17 0145  ceFEPIme (MAXIPIME) 500 mg in dextrose 5 % 50 mL IVPB  Status:  Discontinued     500 mg 100  mL/hr over 30 Minutes Intravenous Every 8 hours 09/25/17 0134 09/25/17 0159   09/24/17 1530  vancomycin (VANCOCIN) IVPB 1000 mg/200 mL premix     1,000 mg 200 mL/hr over 60 Minutes Intravenous  Once 09/24/17 1515 09/24/17 1730   09/24/17 1530  piperacillin-tazobactam (ZOSYN) IVPB 3.375 g     3.375 g 100 mL/hr over 30 Minutes Intravenous  Once 09/24/17 1515 09/24/17 1629      Assessment: 57 yr old male admitted for r/o sepsis.  Pt received Vancomycin 1 gram IV in ED @ 16:30 on 09/24/17.   Patient had a measured trough of 19 while on Vancomycin 1 gram IV Q12 hr during an admission in Sept. 2018.  Goal of Therapy:  Vancomycin trough level 15-20 mcg/ml  Plan:  Preliminary review of pertinent patient information completed.  Protocol will be initiated with Vancomycin 1 gram IV Q12hr.  Forestine Na clinical pharmacist will complete review during morning rounds to assess patient and finalize treatment regimen if needed.  Beryle Lathe, [facility] 09/25/2017,2:23 AM

## 2017-09-25 NOTE — H&P (View-Only) (Signed)
[facility] Consult  Reason for Consult: Left gluteal abscess  Referring Physician:  Dr. Manuella Ghazi   Chief Complaint    pulled peg tube out      Joe Wilkinson is a 57 y.o. male.  HPI: Joe [place]kinson is a 57 yo who presented from his SNF "feeling bad."  He also was found to have his PEG dislodged on arrival to the ED and a foley was placed back into the area.  CT scan has demonstrated a left gluteal abscess and question of a right gluteal abscess and an abscess in the anorectal junction posteriorly.  He stays at the SNF and has been there since his Stroke s/p Right decompressive craniectomy and PEG placement by GI. He says that his PEG is used daily and nightly for supplemental feeds.  He does take in some food by mouth but says he feels full most of the time.    CT scan demonstrated the PEG within the stomach. No drainage or abscess around the PEG.   Past Medical History:  Diagnosis Date  . DM (diabetes mellitus) ([place])   . Hypertension   . Stroke [facility])     Past Surgical History:  Procedure Laterality Date  . CRANIECTOMY Right 04/01/2017   Procedure: RIGHT DECOMPRESSIVE CRANIECTOMY;  Surgeon: Ditty, Kevan Ny, MD;  Location: [place];  Service: Neurosurgery;  Laterality: Right;  . ESOPHAGOGASTRODUODENOSCOPY N/A 04/13/2017   Procedure: ESOPHAGOGASTRODUODENOSCOPY (EGD);  Surgeon: Georganna Skeans, MD;  Location: [place];  Service: General;  Laterality: N/A;  bedside  . PEG PLACEMENT N/A 04/13/2017   Procedure: PERCUTANEOUS ENDOSCOPIC GASTROSTOMY (PEG) PLACEMENT;  Surgeon: Georganna Skeans, MD;  Location: [facility];  Service: General;  Laterality: N/A;    No family history on file.  Social History   Tobacco Use  . Smoking status: Former Smoker    Types: Cigarettes  . Smokeless tobacco: Never Used  . Tobacco comment: UTA  Substance Use Topics  . Alcohol use: No    Comment: UTA  . Drug use: No    Comment: UTA    Medications:  I have reviewed the patient's  current medications. Prior to Admission:  Medications Prior to Admission  Medication Sig Dispense Refill Last Dose  . Amino Acids-Protein Hydrolys (FEEDING SUPPLEMENT, PRO-STAT SUGAR FREE 64,) LIQD Place 30 mLs into feeding tube daily. Per gastric tube    09/24/2017 at Unknown time  . apixaban (ELIQUIS) 5 MG TABS tablet Place 1 tablet (5 mg total) into feeding tube 2 (two) times daily. 60 tablet  09/24/2017 at [place]  . bisacodyl (DULCOLAX) 10 MG suppository Place 1 suppository (10 mg total) rectally daily as needed for moderate constipation. 12 suppository 0 unknown  . chlorhexidine (PERIDEX) 0.12 % solution 15 mLs by Mouth Rinse route 2 (two) times daily. 120 mL 0 09/24/2017 at Unknown time  . Cholecalciferol (VITAMIN D3) 5000 units CAPS Take 1 capsule (5,000 Units total) by mouth daily. (Patient taking differently: Place 5,000 Units into feeding tube daily. ) 90 capsule 0 09/24/2017 at Unknown time  . docusate (COLACE) 50 MG/5ML liquid Place 10 mLs (100 mg total) into feeding tube 2 (two) times daily as needed for mild constipation. 100 mL 0 09/24/2017 at Unknown time  . feeding supplement, ENSURE ENLIVE, (ENSURE ENLIVE) LIQD Take 237 mLs by mouth 2 (two) times daily between meals. 237 mL 12 09/24/2017 at Unknown time  . gabapentin (NEURONTIN) 300 MG capsule Take 1 capsule (300 mg total) by mouth at bedtime.   09/23/2017 at Unknown  time  . insulin detemir (LEVEMIR) 100 UNIT/ML injection Inject 0.34 mLs (34 Units total) into the skin daily. 10 mL 11 09/24/2017 at Unknown time  . metoprolol tartrate (LOPRESSOR) 25 MG tablet Place 0.5 tablets (12.5 mg total) into feeding tube 2 (two) times daily.   09/24/2017 at 900a  . mirtazapine (REMERON) 15 MG tablet Take 7.5 mg by mouth at bedtime.   09/23/2017 at Unknown time  . Nutritional Supplements (FEEDING SUPPLEMENT, GLUCERNA 1.2 CAL,) LIQD Glucerna 1.2 cal by peg tube Three times a day flush with 30 ml free water before and after administering full volume of TF.    09/24/2017 at Unknown time  . pantoprazole sodium (PROTONIX) 40 mg/20 mL PACK Place 20 mLs (40 mg total) into feeding tube daily. 30 each  09/24/2017 at Unknown time  . polyethylene glycol (MIRALAX / GLYCOLAX) packet Place 17 g into feeding tube daily.   09/24/2017 at Unknown time  . Water For Irrigation, Sterile (FREE WATER) SOLN Place 300 mLs into feeding tube every 4 (four) hours. Keep HOB elevated at 45 degrees at all times during feedings, flushings, and medications 12a, 4a, 8a, 12p, 4p,8p   unknown  . witch hazel-glycerin (TUCKS) pad Apply 1 application topically as needed for itching.   unknown  . zoledronic acid (RECLAST) 5 MG/100ML SOLN injection Inject 5 mg into the vein once.   09/13/2017 at Unknown time  . acetaminophen (TYLENOL) 325 MG tablet Place 650 mg into feeding tube every 4 (four) hours as needed.   unknown   Scheduled: . chlorhexidine  15 mL Mouth Rinse BID  . Chlorhexidine Gluconate Cloth  6 each Topical Daily  . feeding supplement (ENSURE ENLIVE)  237 mL Oral BID BM  . insulin aspart  0-15 Units Subcutaneous Q4H  . insulin detemir  34 Units Subcutaneous Daily  . pantoprazole (PROTONIX) IV  40 mg Intravenous Q24H  . polyethylene glycol  17 g Per Tube Daily  . sodium chloride flush  10-40 mL Intracatheter Q12H  . sodium chloride flush  3 mL Intravenous Q12H   Continuous: . sodium chloride 125 mL/hr (09/25/17 0018)  . ceFEPime (MAXIPIME) IV Stopped (09/25/17 1007)  . metronidazole Stopped (09/25/17 1145)  . norepinephrine (LEVOPHED) Adult infusion 7 mcg/min (09/25/17 1146)  . vancomycin 1,000 mg (09/25/17 0522)   VOJ:[id number] **OR** acetaminophen, bisacodyl, docusate, hydrALAZINE, ondansetron **OR** ondansetron (ZOFRAN) IV, sodium chloride flush  Allergies: Allergies  Allergen Reactions  . Penicillins Hives    ROS:  A comprehensive review of systems was negative except for: Constitutional: positive for malaise Gastrointestinal: positive for abdominal pain and  buttock/ rectal pain     Blood pressure 101/83, pulse 99, temperature 98.1 F (36.7 C), temperature source Oral, resp. rate (!) 26, height '5\' 6"'$  (1.676 m), weight 179 lb 3.7 oz (81.3 kg), SpO2 98 %. Physical Exam  Constitutional: He is well-developed, well-nourished, and in no distress.  HENT:  Head: Normocephalic.  Eyes: Pupils are equal, round, and reactive to light.  Neck: Normal range of motion.  Cardiovascular: Normal rate.  Abdominal: Soft. He exhibits no distension. There is no tenderness.  PEG in place with no drainage, moves freely   Genitourinary:  Genitourinary Comments: Rectal exam deferred due to pain, left gluteal area with ruptured abscess, about 1inch size site of rupture, copious purulence expressed, cavity interrogated with finger and loculations broken up, right perirectal area/ glute without induration or fluctuance that is obvious  Musculoskeletal: Normal range of motion.  Vitals reviewed.   Results: Results  for orders placed or performed during the hospital encounter of 09/24/17 (from the past 48 hour(s))  Influenza panel by PCR (type A & B)     Status: None   Collection Time: 09/24/17  3:15 PM  Result Value Ref Range   Influenza A By PCR NEGATIVE NEGATIVE   Influenza B By PCR NEGATIVE NEGATIVE    Comment: (NOTE) The Xpert Xpress Flu assay is intended as an aid in the diagnosis of  influenza and should not be used as a sole basis for treatment.  This  assay is FDA approved for nasopharyngeal swab specimens only. Nasal  washings and aspirates are unacceptable for Xpert Xpress Flu testing. Performed at [facility], [address] [place], [place] [address]   Comprehensive metabolic panel     Status: Abnormal   Collection Time: 09/24/17  3:27 PM  Result Value Ref Range   Sodium 140 135 - 145 mmol/L   Potassium 4.2 3.5 - 5.1 mmol/L   Chloride 103 101 - 111 mmol/L   CO2 26 22 - 32 mmol/L   Glucose, Bld 141 (H) 65 - 99 mg/dL   BUN 24 (H) 6 - 20 mg/dL    Creatinine, Ser 0.90 0.61 - 1.24 mg/dL   Calcium 9.2 8.9 - 10.3 mg/dL   Total Protein 8.3 (H) 6.5 - 8.1 g/dL   Albumin 2.6 (L) 3.5 - 5.0 g/dL   AST 34 15 - 41 U/L   ALT 59 17 - 63 U/L   Alkaline Phosphatase 102 38 - 126 U/L   Total Bilirubin 1.0 0.3 - 1.2 mg/dL   GFR calc non Af Amer >60 >60 mL/min   GFR calc Af Amer >60 >60 mL/min    Comment: (NOTE) The eGFR has been calculated using the CKD EPI equation. This calculation has not been validated in all clinical situations. eGFR's persistently <60 mL/min signify possible Chronic Kidney Disease.    Anion gap 11 5 - 15    Comment: Performed at [facility], [address] [place], [place] [address]  CBC with Differential     Status: Abnormal   Collection Time: 09/24/17  3:27 PM  Result Value Ref Range   WBC 23.8 (H) 4.0 - 10.5 K/uL   RBC 4.29 4.22 - 5.81 MIL/uL   Hemoglobin 11.9 (L) 13.0 - 17.0 g/dL   HCT 38.9 (L) 39.0 - 52.0 %   MCV 90.7 78.0 - 100.0 fL   MCH 27.7 26.0 - 34.0 pg   MCHC 30.6 30.0 - 36.0 g/dL   RDW 15.1 11.5 - 15.5 %   Platelets 271 150 - 400 K/uL   Neutrophils Relative % 80 %   Neutro Abs 19.0 (H) 1.7 - 7.7 K/uL   Lymphocytes Relative 6 %   Lymphs Abs 1.4 0.7 - 4.0 K/uL   Monocytes Relative 13 %   Monocytes Absolute 3.2 (H) 0.1 - 1.0 K/uL   Eosinophils Relative 1 %   Eosinophils Absolute 0.2 0.0 - 0.7 K/uL   Basophils Relative 0 %   Basophils Absolute 0.1 0.0 - 0.1 K/uL    Comment: Performed at [facility], [address] [place], [place] [address]  Protime-INR     Status: Abnormal   Collection Time: 09/24/17  3:27 PM  Result Value Ref Range   Prothrombin Time 16.2 (H) 11.4 - 15.2 seconds   INR 1.31     Comment: Performed at [facility], [address] [place],  [address]  Phosphorus     Status: Abnormal  Collection Time: 09/24/17  3:27 PM  Result Value Ref Range   Phosphorus 2.2 (L) 2.5 - 4.6 mg/dL    Comment: Performed at [facility], [address] [place], [place] [address]  Lipase,  blood     Status: None   Collection Time: 09/24/17  3:27 PM  Result Value Ref Range   Lipase 35 11 - 51 U/L    Comment: Performed at [facility], [address], [place], [place] [address]  Culture, blood (Routine x 2)     Status: None (Preliminary result)   Collection Time: 09/24/17  3:29 PM  Result Value Ref Range   Specimen Description      BLOOD LEFT ANTECUBITAL Blood Culture adequate volume BOTTLES DRAWN AEROBIC ONLY   Special Requests NONE    Culture      NO GROWTH < 24 HOURS Performed at [facility], [address], [place], [place] [address]    Report Status PENDING   I-Stat CG4 Lactic Acid, ED     Status: None   Collection Time: 09/24/17  3:43 PM  Result Value Ref Range   Lactic Acid, Venous 1.55 0.5 - 1.9 mmol/L  Urinalysis, Routine w reflex microscopic     Status: Abnormal   Collection Time: 09/24/17  5:49 PM  Result Value Ref Range   Color, Urine YELLOW YELLOW   APPearance CLEAR CLEAR   Specific Gravity, Urine 1.020 1.005 - 1.030   pH 6.0 5.0 - 8.0   Glucose, UA NEGATIVE NEGATIVE mg/dL   Hgb urine dipstick NEGATIVE NEGATIVE   Bilirubin Urine NEGATIVE NEGATIVE   Ketones, ur NEGATIVE NEGATIVE mg/dL   Protein, ur >=300 (A) NEGATIVE mg/dL   Nitrite NEGATIVE NEGATIVE   Leukocytes, UA NEGATIVE NEGATIVE   RBC / HPF 6-30 0 - 5 RBC/hpf   WBC, UA 0-5 0 - 5 WBC/hpf   Bacteria, UA NONE SEEN NONE SEEN   Squamous Epithelial / LPF 0-5 (A) NONE SEEN   Mucus PRESENT     Comment: Performed at [facility], [address] [place], [place] [address]  CBG monitoring, ED     Status: None   Collection Time: 09/24/17  8:43 PM  Result Value Ref Range   Glucose-Capillary 95 65 - 99 mg/dL  CBG monitoring, ED     Status: Abnormal   Collection Time: 09/24/17  9:29 PM  Result Value Ref Range   Glucose-Capillary 107 (H) 65 - 99 mg/dL  MRSA PCR Screening     Status: None   Collection Time: 09/24/17 11:30 PM  Result Value Ref Range   MRSA by PCR NEGATIVE NEGATIVE    Comment:         The GeneXpert MRSA Assay (FDA approved for NASAL specimens only), is one component of a comprehensive MRSA colonization surveillance program. It is not intended to diagnose MRSA infection nor to guide or monitor treatment for MRSA infections. Performed at [facility], [address] [place], [place] [address]   Glucose, capillary     Status: None   Collection Time: 09/25/17 12:26 AM  Result Value Ref Range   Glucose-Capillary 96 65 - 99 mg/dL   Comment 1 Notify RN   Glucose, capillary     Status: Abnormal   Collection Time: 09/25/17  4:13 AM  Result Value Ref Range   Glucose-Capillary 120 (H) 65 - 99 mg/dL   Comment 1 Notify RN   Basic metabolic panel     Status: Abnormal   Collection Time: 09/25/17  5:31 AM  Result Value Ref Range   Sodium 141 135 - 145 mmol/L   Potassium 3.4 (L) 3.5 - 5.1 mmol/L    Comment: DELTA CHECK NOTED   Chloride 112 (H) 101 - 111 mmol/L   CO2 19 (L) 22 - 32 mmol/L   Glucose, Bld 146 (H) 65 - 99 mg/dL   BUN 13 6 - 20 mg/dL   Creatinine, Ser 0.69 0.61 - 1.24 mg/dL   Calcium 8.0 (L) 8.9 - 10.3 mg/dL   GFR calc non Af Amer >60 >60 mL/min   GFR calc Af Amer >60 >60 mL/min    Comment: (NOTE) The eGFR has been calculated using the CKD EPI equation. This calculation has not been validated in all clinical situations. eGFR's persistently <60 mL/min signify possible Chronic Kidney Disease.    Anion gap 10 5 - 15    Comment: Performed at [facility], [address], [place], [place] [address]  CBC     Status: Abnormal   Collection Time: 09/25/17  5:31 AM  Result Value Ref Range   WBC 25.0 (H) 4.0 - 10.5 K/uL   RBC 3.69 (L) 4.22 - 5.81 MIL/uL   Hemoglobin 10.1 (L) 13.0 - 17.0 g/dL   HCT 33.7 (L) 39.0 - 52.0 %   MCV 91.3 78.0 - 100.0 fL   MCH 27.4 26.0 - 34.0 pg   MCHC 30.0 30.0 - 36.0 g/dL   RDW 15.4 11.5 - 15.5 %   Platelets 290 150 - 400 K/uL    Comment: Performed at [facility], [address], [place], [place] [address]  Magnesium      Status: None   Collection Time: 09/25/17  5:45 AM  Result Value Ref Range   Magnesium 2.1 1.7 - 2.4 mg/dL    Comment: Performed at [facility], [address] [place], [place] [address]  Glucose, capillary     Status: Abnormal   Collection Time: 09/25/17  7:57 AM  Result Value Ref Range   Glucose-Capillary 157 (H) 65 - 99 mg/dL  Glucose, capillary     Status: Abnormal   Collection Time: 09/25/17  9:21 AM  Result Value Ref Range   Glucose-Capillary 148 (H) 65 - 99 mg/dL  Glucose, capillary     Status: Abnormal   Collection Time: 09/25/17 11:17 AM  Result Value Ref Range   Glucose-Capillary 142 (H) 65 - 99 mg/dL    CT personally reviewed large left gluteal abscess, partially visualized, right gluteal fluid collection? And posterior collection but this appears to connect with the let; ? Possibly horseshoe abscess with prominent portion on left? Difficult to say with imaging   Ct Abdomen Pelvis W Contrast  Result Date: 09/24/2017 CLINICAL DATA:  Abdominal pain at PEG tube site after pulling at out accidentally. Weakness and increased pain to right-sided flank. EXAM: CT ABDOMEN AND PELVIS WITH CONTRAST TECHNIQUE: Multidetector CT imaging of the abdomen and pelvis was performed using the standard protocol following bolus administration of intravenous contrast. CONTRAST:  15m ISOVUE-300 IOPAMIDOL (ISOVUE-300) INJECTION 61% COMPARISON:  CT abdomen dated 04/22/2017. FINDINGS: Lower chest: No acute abnormality. Hepatobiliary: Stable hemangioma within the left liver lobe. No acute or suspicious abnormality within the liver. Gallbladder is not seen, presumed cholecystectomy. No bile duct dilatation. Pancreas: Unremarkable. No pancreatic ductal dilatation or surrounding inflammatory changes. Spleen: Normal in size without focal abnormality. Adrenals/Urinary Tract: Adrenal glands appear normal. Kidneys appear normal without mass, stone or hydronephrosis. Bladder appears normal. Stomach/Bowel: Worsening  appearance of the perianal soft tissues. There is  now a defined abscess collection to the left of the gluteal fold, perianal, measuring at least 6.7 x 3.4 cm, incompletely imaged at the lower aspects of the exam. Smaller collection to the right of the gluteal fold, measuring 2.3 cm, also incompletely imaged. An additional 1.9 cm collection more centrally near the anorectal junction. Associated thickening of the walls of the lower rectum. No dilated large or small bowel loops. Appendix is normal. Per clinical data, Foley catheter tube in place of the PEG tube, well positioned in the stomach. No fluid collection or evidence of inflammatory change at the catheter entrance site. Vascular/Lymphatic: No significant vascular findings are present. No enlarged abdominal or pelvic lymph nodes. Reproductive: Mild prominence of the prostate gland causing slight mass effect on the bladder base. Other: No free intraperitoneal air. Musculoskeletal: No acute or suspicious osseous finding. Mild degenerative change throughout the thoracolumbar spine. IMPRESSION: 1. Per report, Foley catheter was placed after PEG tube was accidentally removed. The Foley catheter appears well positioned in the stomach. No fluid collection, inflammatory change or other procedural complicating feature at the PEG tube insertion site. 2. WORSENED appearance of the PERIANAL soft tissues. There is now a defined abscess collection to the left of the gluteal fold, perianal, measuring at least 6.7 cm greatest dimension, but incompletely imaged at the lower aspects of the exam. Smaller abscess collection to the right of the gluteal fold measures 2.3 cm, also incompletely imaged. Additional 1.9 cm abscess collection more centrally near the anal rectal junction. Associated thickening of the walls of the lower rectum, with surrounding perirectal inflammation, but no extension of the defined abscess collection into the perirectal fat of the lower pelvis. 3.  Additional chronic/incidental findings detailed above. Electronically Signed   By: Franki Cabot M.D.   On: 09/24/2017 22:29   Dg Chest Portable 1 View  Result Date: 09/24/2017 CLINICAL DATA:  Fever. EXAM: PORTABLE CHEST 1 VIEW COMPARISON:  05/26/2017 FINDINGS: The heart size and mediastinal contours are within normal limits. Both lungs are clear. The visualized skeletal structures are unremarkable. IMPRESSION: No active disease. Electronically Signed   By: Kerby Moors M.D.   On: 09/24/2017 16:15    Assessment & Plan:  Clayborne Divis is a 57 y.o. male with a history of stroke, requiring right decompressive craniectomy, PEG placement 04/2017 by GI.  He is still requiring supplemental feeds at the SNF per his report and gets "Milk" likely tube feeds at the SNF during the night.  - PEG ok to use, foley is just as good as PEG tube, and can be as it is confirmed on on CT scan to be in the stomach - Would not remove the PEG until:   1.) Speech verifies patient is safe to take in food by mouth  2.) Patient has trial of po intake that is adequate to sustain his  nutrition/ hydration  - If patient completes the above, the the PEG can be removed at the bedside and allowed to heal secondarily  -Left gluteal abscess already draining, verbal consent obtained from the patient to break up loculations and place a Penrose drain  - Will leave the drain in place for a few days to allow for adequate drainage - IV antibiotics  - Will possibly need repeat CT scan of the pelvis to verify complete drainage of the large area   All questions were answered to the satisfaction of the patient. He gave verbal consent to have the Penrose placed.   Virl Cagey 09/25/2017,  1:24 PM

## 2017-09-26 DIAGNOSIS — K610 Anal abscess: Secondary | ICD-10-CM

## 2017-09-26 DIAGNOSIS — Z431 Encounter for attention to gastrostomy: Secondary | ICD-10-CM

## 2017-09-26 DIAGNOSIS — T85528A Displacement of other gastrointestinal prosthetic devices, implants and grafts, initial encounter: Secondary | ICD-10-CM

## 2017-09-26 LAB — BASIC METABOLIC PANEL
Anion gap: 7 (ref 5–15)
BUN: 7 mg/dL (ref 6–20)
CHLORIDE: 110 mmol/L (ref 101–111)
CO2: 19 mmol/L — AB (ref 22–32)
Calcium: 7.7 mg/dL — ABNORMAL LOW (ref 8.9–10.3)
Creatinine, Ser: 0.55 mg/dL — ABNORMAL LOW (ref 0.61–1.24)
GFR calc non Af Amer: >60 mL/min (ref >60)
Glucose, Bld: 89 mg/dL (ref 65–99)
Potassium: 3.5 mmol/L (ref 3.5–5.1)
Sodium: 136 mmol/L (ref 135–145)

## 2017-09-26 LAB — GLUCOSE, CAPILLARY
GLUCOSE-CAPILLARY: 73 mg/dL (ref 65–99)
GLUCOSE-CAPILLARY: 91 mg/dL (ref 65–99)
GLUCOSE-CAPILLARY: 67 mg/dL (ref 65–99)
Glucose-Capillary: 92 mg/dL (ref 65–99)
Glucose-Capillary: 80 mg/dL (ref 65–99)

## 2017-09-26 LAB — CBC
HEMATOCRIT: 31.2 % — AB (ref 39.0–52.0)
HEMOGLOBIN: 9.4 g/dL — AB (ref 13.0–17.0)
MCH: 27.2 pg (ref 26.0–34.0)
MCHC: 30.1 g/dL (ref 30.0–36.0)
MCV: 90.2 fL (ref 78.0–100.0)
Platelets: 311 10*3/uL (ref 150–400)
RBC: 3.46 MIL/uL — AB (ref 4.22–5.81)
RDW: 15.2 % (ref 11.5–15.5)
WBC: 19.4 10*3/uL — ABNORMAL HIGH (ref 4.0–10.5)

## 2017-09-26 LAB — LACTIC ACID, PLASMA: Lactic Acid, Venous: 0.5 mmol/L (ref 0.5–1.9)

## 2017-09-26 MED ORDER — JUVEN PO PACK
1.0000 | PACK | Freq: Two times a day (BID) | ORAL | Status: DC
  Administered 2017-09-27 – 2017-09-30 (×7): 1 via ORAL
  Filled 2017-09-26 (×8): qty 1

## 2017-09-26 MED ORDER — INSULIN DETEMIR 100 UNIT/ML ~~LOC~~ SOLN
30.0000 [IU] | Freq: Every day | SUBCUTANEOUS | Status: DC
  Administered 2017-09-26 – 2017-09-30 (×4): 30 [IU] via SUBCUTANEOUS
  Filled 2017-09-26 (×8): qty 0.3

## 2017-09-26 MED ORDER — APIXABAN 5 MG PO TABS
5.0000 mg | ORAL_TABLET | Freq: Two times a day (BID) | ORAL | Status: DC
  Administered 2017-09-26 – 2017-09-27 (×2): 5 mg
  Filled 2017-09-26 (×2): qty 1

## 2017-09-26 MED ORDER — GLUCERNA 1.2 CAL PO LIQD
300.0000 mL | Freq: Four times a day (QID) | ORAL | Status: DC
  Administered 2017-09-26 – 2017-09-30 (×14): 300 mL
  Filled 2017-09-26 (×5): qty 1000
  Filled 2017-09-26: qty 474
  Filled 2017-09-26: qty 1000
  Filled 2017-09-26: qty 474
  Filled 2017-09-26 (×3): qty 1000
  Filled 2017-09-26: qty 474
  Filled 2017-09-26 (×9): qty 1000
  Filled 2017-09-26: qty 474
  Filled 2017-09-26: qty 1000
  Filled 2017-09-26: qty 474
  Filled 2017-09-26: qty 1000

## 2017-09-26 MED ORDER — PRO-STAT SUGAR FREE PO LIQD
30.0000 mL | Freq: Two times a day (BID) | ORAL | Status: DC
  Administered 2017-09-26 – 2017-09-30 (×6): 30 mL via ORAL
  Filled 2017-09-26 (×7): qty 30

## 2017-09-26 NOTE — Progress Notes (Signed)
Inpatient Diabetes Program Recommendations  AACE/ADA: New Consensus Statement on Inpatient Glycemic Control (2015)  Target Ranges:  Prepandial:   less than 140 mg/dL      Peak postprandial:   less than 180 mg/dL (1-2 hours)      Critically ill patients:  140 - 180 mg/dL   Results for Joe Wilkinson, Joe Wilkinson (MRN [id number]) as of 09/26/2017 08:44  Ref. Range 09/25/2017 07:57 09/25/2017 09:21 09/25/2017 11:17 09/25/2017 17:08 09/25/2017 20:15 09/25/2017 23:15 09/26/2017 03:37 09/26/2017 07:56  Glucose-Capillary Latest Ref Range: 65 - 99 mg/dL 157 (H) 148 (H) 142 (H) 111 (H) 133 (H) 72 73 91   Review of Glycemic Control  Diabetes history: DM2 Outpatient Diabetes medications: Levemir 34 units daily Current orders for Inpatient glycemic control: Levemir 34 units daily, Novolog 0-15 units Q4H  Inpatient Diabetes Program Recommendations: Insulin - Basal: Please consider slightly decreasing Levemir to 30 units daily.  Thanks, Barnie Alderman, RN, MSN, CDE Diabetes Coordinator Inpatient Diabetes Program [phone] (Team Pager from 8am to 5pm)

## 2017-09-26 NOTE — Progress Notes (Signed)
PROGRESS NOTE    Joe Wilkinson  BSJ:[id number] DOB: 07/12/1961 DOA: 09/24/2017 PCP: Virgie Dad, MD   Brief Narrative:   This is a 57 year old male with history of prior CVA and left-sided hemiparesis along with PEG tube, diabetes, and hypertension who was brought to the ED due to "feeling bad."  He apparently pulled his PEG tube out which was replaced with a Foley catheter.  He was placed on vancomycin and cefepime for sepsis that appears to be associated to a left gluteal abscess.  IV Flagyl has been added on 2/18 for better anaerobic coverage.  This was evaluated by general surgery on 2/18 and the patient began to spontaneously drain and had some debridement of loculations with a Penrose drain placed.  He has been recommended to have a speech evaluation performed on account of the fact that he does have some oral intake. Thus far he has been recommended a pureed diet, but does not have adequate oral intake for which he will require some PEG feeds. He has been weaned off of his norepinephrine at this point with stable VS noted thus far. His leukocytosis has been improving.  Assessment & Plan:   Active Problems:   Sepsis ([place])   Abscess, perianal   Dislodged gastrostomy tube ([place])   1. Septic shock likely secondary to left-sided gluteal abscess-improving.  Blood culture thus far has remained negative.  Appreciate general surgery evaluation for I&D.  Culture from abscess is currently pending, but noted to have gram+ cocci in clusters.  Continue antibiotics for now and reassess clinically for need of repeat CT.  Repeat lactic acid level in a.m.  Continue IV fluid. Appreciate GS. 2. Displaced PEG tube.  Continue with Foley catheter at PEG site.  This can be used as needed for feeding which will be ordered.  Appreciate speech/swallow for some oral pureed feeds, but patient is being difficult with his oral intake. 3. Hypertension-stabilized.  Hold blood pressure medications for now. Weaned off  Levophed over 24 hours ago. Could move to floor. 4. Diabetes.  Good blood glucose control noted thus far.  Continue current management. 5. Prior CVA.  Restart Eliquis today.   DVT prophylaxis:SCDs and restart Eliquis today Code Status: Full Family Communication: None Disposition Plan: TBD   Consultants:   General Surgery  Procedures:   L gluteal abscess bedside I/D 2/18  Antimicrobials:   Vancomycin and Cefepime 2/17->  Flagyl 2/18->   Subjective: Patient seen and evaluated today with no new acute complaints or concerns. No acute concerns or events noted overnight. He is being difficult with his feedings and is asking for chocolate milk.  Objective: Vitals:   09/26/17 1400 09/26/17 1500 09/26/17 1600 09/26/17 1700  BP: 117/69 117/70 120/73 121/66  Pulse: (!) 101 (!) 106 98 (!) 108  Resp: 19 (!) 0 11 16  Temp:      TempSrc:      SpO2: 97% 99% 96% 97%  Weight:      Height:        Intake/Output Summary (Last 24 hours) at 09/26/2017 1707 Last data filed at 09/26/2017 1500 Gross per 24 hour  Intake 5775 ml  Output 2250 ml  Net 3525 ml   Filed Weights   09/24/17 2343 09/25/17 0500 09/26/17 0500  Weight: 81.3 kg (179 lb 3.7 oz) 81.3 kg (179 lb 3.7 oz) 82.3 kg (181 lb 7 oz)    Examination:  General exam: Appears calm and comfortable  Respiratory system: Clear to auscultation. Respiratory effort normal.  Cardiovascular system: S1 & S2 heard, RRR. No JVD, murmurs, rubs, gallops or clicks. No pedal edema. Gastrointestinal system: Abdomen is nondistended, soft and nontender. No organomegaly or masses felt. Normal bowel sounds heard. Central nervous system: Alert and oriented. No focal neurological deficits. Extremities: Symmetric 5 x 5 power. Skin: Penrose drain with ongoing drainage to gluteal area Psychiatry: Judgement and insight appear normal. Mood & affect appropriate.     Data Reviewed: I have personally reviewed following labs and imaging  studies  CBC: Recent Labs  Lab 09/24/17 1527 09/25/17 0531 09/26/17 0439  WBC 23.8* 25.0* 19.4*  NEUTROABS 19.0*  --   --   HGB 11.9* 10.1* 9.4*  HCT 38.9* 33.7* 31.2*  MCV 90.7 91.3 90.2  PLT 271 290 379   Basic Metabolic Panel: Recent Labs  Lab 09/24/17 1527 09/25/17 0531 09/25/17 0545 09/26/17 0439  NA 140 141  --  136  K 4.2 3.4*  --  3.5  CL 103 112*  --  110  CO2 26 19*  --  19*  GLUCOSE 141* 146*  --  89  BUN 24* 13  --  7  CREATININE 0.90 0.69  --  0.55*  CALCIUM 9.2 8.0*  --  7.7*  MG  --   --  2.1  --   PHOS 2.2*  --   --   --    GFR: Estimated Creatinine Clearance: 103.8 mL/min (A) (by C-G formula based on SCr of 0.55 mg/dL (L)). Liver Function Tests: Recent Labs  Lab 09/24/17 1527  AST 34  ALT 59  ALKPHOS 102  BILITOT 1.0  PROT 8.3*  ALBUMIN 2.6*   Recent Labs  Lab 09/24/17 1527  LIPASE 35   No results for input(s): AMMONIA in the last 168 hours. Coagulation Profile: Recent Labs  Lab 09/24/17 1527  INR 1.31   Cardiac Enzymes: No results for input(s): CKTOTAL, CKMB, CKMBINDEX, TROPONINI in the last 168 hours. BNP (last 3 results) No results for input(s): PROBNP in the last 8760 hours. HbA1C: No results for input(s): HGBA1C in the last 72 hours. CBG: Recent Labs  Lab 09/25/17 2015 09/25/17 2315 09/26/17 0337 09/26/17 0756 09/26/17 1202  GLUCAP 133* 72 73 91 92   Lipid Profile: No results for input(s): CHOL, HDL, LDLCALC, TRIG, CHOLHDL, LDLDIRECT in the last 72 hours. Thyroid Function Tests: No results for input(s): TSH, T4TOTAL, FREET4, T3FREE, THYROIDAB in the last 72 hours. Anemia Panel: No results for input(s): VITAMINB12, FOLATE, FERRITIN, TIBC, IRON, RETICCTPCT in the last 72 hours. Sepsis Labs: Recent Labs  Lab 09/24/17 1543 09/26/17 0439  LATICACIDVEN 1.55 0.5    Recent Results (from the past 240 hour(s))  Culture, blood (Routine x 2)     Status: None (Preliminary result)   Collection Time: 09/24/17  3:29 PM   Result Value Ref Range Status   Specimen Description   Final    BLOOD LEFT ANTECUBITAL Blood Culture adequate volume BOTTLES DRAWN AEROBIC ONLY   Special Requests NONE  Final   Culture   Final    NO GROWTH 2 DAYS Performed at [facility], [address] [place], [place] [address]    Report Status PENDING  Incomplete  MRSA PCR Screening     Status: None   Collection Time: 09/24/17 11:30 PM  Result Value Ref Range Status   MRSA by PCR NEGATIVE NEGATIVE Final    Comment:        The GeneXpert MRSA Assay (FDA approved for NASAL specimens only), is one  component of a comprehensive MRSA colonization surveillance program. It is not intended to diagnose MRSA infection nor to guide or monitor treatment for MRSA infections. Performed at [facility], [address], [place], [place] [address]   Aerobic Culture (superficial specimen)     Status: None (Preliminary result)   Collection Time: 09/25/17  1:55 PM  Result Value Ref Range Status   Specimen Description   Final    ABSCESS Performed at [facility], [address] [place], [place] [address]    Special Requests   Final    Normal Performed at [facility], [address] [place], [place] [address]    Gram Stain   Final    MODERATE WBC PRESENT, PREDOMINANTLY PMN FEW GRAM POSITIVE COCCI IN PAIRS IN CLUSTERS RARE GRAM NEGATIVE RODS Performed at [facility], [place] [address] [place], [place] [address]    Culture PENDING  Incomplete   Report Status PENDING  Incomplete         Radiology Studies: Ct Abdomen Pelvis W Contrast  Result Date: 09/24/2017 CLINICAL DATA:  Abdominal pain at PEG tube site after pulling at out accidentally. Weakness and increased pain to right-sided flank. EXAM: CT ABDOMEN AND PELVIS WITH CONTRAST TECHNIQUE: Multidetector CT imaging of the abdomen and pelvis was performed using the standard protocol following bolus administration of intravenous contrast. CONTRAST:  151mL ISOVUE-300 IOPAMIDOL  (ISOVUE-300) INJECTION 61% COMPARISON:  CT abdomen dated 04/22/2017. FINDINGS: Lower chest: No acute abnormality. Hepatobiliary: Stable hemangioma within the left liver lobe. No acute or suspicious abnormality within the liver. Gallbladder is not seen, presumed cholecystectomy. No bile duct dilatation. Pancreas: Unremarkable. No pancreatic ductal dilatation or surrounding inflammatory changes. Spleen: Normal in size without focal abnormality. Adrenals/Urinary Tract: Adrenal glands appear normal. Kidneys appear normal without mass, stone or hydronephrosis. Bladder appears normal. Stomach/Bowel: Worsening appearance of the perianal soft tissues. There is now a defined abscess collection to the left of the gluteal fold, perianal, measuring at least 6.7 x 3.4 cm, incompletely imaged at the lower aspects of the exam. Smaller collection to the right of the gluteal fold, measuring 2.3 cm, also incompletely imaged. An additional 1.9 cm collection more centrally near the anorectal junction. Associated thickening of the walls of the lower rectum. No dilated large or small bowel loops. Appendix is normal. Per clinical data, Foley catheter tube in place of the PEG tube, well positioned in the stomach. No fluid collection or evidence of inflammatory change at the catheter entrance site. Vascular/Lymphatic: No significant vascular findings are present. No enlarged abdominal or pelvic lymph nodes. Reproductive: Mild prominence of the prostate gland causing slight mass effect on the bladder base. Other: No free intraperitoneal air. Musculoskeletal: No acute or suspicious osseous finding. Mild degenerative change throughout the thoracolumbar spine. IMPRESSION: 1. Per report, Foley catheter was placed after PEG tube was accidentally removed. The Foley catheter appears well positioned in the stomach. No fluid collection, inflammatory change or other procedural complicating feature at the PEG tube insertion site. 2. WORSENED appearance  of the PERIANAL soft tissues. There is now a defined abscess collection to the left of the gluteal fold, perianal, measuring at least 6.7 cm greatest dimension, but incompletely imaged at the lower aspects of the exam. Smaller abscess collection to the right of the gluteal fold measures 2.3 cm, also incompletely imaged. Additional 1.9 cm abscess collection more centrally near the anal rectal junction. Associated thickening of the walls of the lower rectum, with surrounding perirectal inflammation, but no extension of  the defined abscess collection into the perirectal fat of the lower pelvis. 3. Additional chronic/incidental findings detailed above. Electronically Signed   By: Franki Cabot M.D.   On: 09/24/2017 22:29        Scheduled Meds: . apixaban  5 mg Per Tube BID  . chlorhexidine  15 mL Mouth Rinse BID  . Chlorhexidine Gluconate Cloth  6 each Topical Daily  . feeding supplement (ENSURE ENLIVE)  237 mL Oral BID BM  . feeding supplement (GLUCERNA 1.2 CAL)  300 mL Per Tube QID  . feeding supplement (PRO-STAT SUGAR FREE 64)  30 mL Oral BID  . insulin aspart  0-15 Units Subcutaneous Q4H  . insulin detemir  30 Units Subcutaneous Daily  . nutrition supplement (JUVEN)  1 packet Oral BID BM  . pantoprazole (PROTONIX) IV  40 mg Intravenous Q24H  . polyethylene glycol  17 g Per Tube Daily  . sodium chloride flush  10-40 mL Intracatheter Q12H  . sodium chloride flush  3 mL Intravenous Q12H   Continuous Infusions: . sodium chloride 125 mL/hr at 09/26/17 1444  . ceFEPime (MAXIPIME) IV Stopped (09/26/17 1011)  . metronidazole Stopped (09/26/17 1040)  . norepinephrine (LEVOPHED) Adult infusion 1 mcg/min (09/25/17 2115)  . vancomycin 1,000 mg (09/26/17 1643)     LOS: 2 days    Time spent: 30 minutes    Audrinna Sherman Darleen Crocker, DO [facility] Pager [phone]  If 7PM-7AM, please contact night-coverage [email or web address] Password [facility] 09/26/2017, 5:07 PM

## 2017-09-26 NOTE — Progress Notes (Signed)
Dr. Manuella Ghazi informed about how patient is not wanting to take medication and tube feeding.

## 2017-09-26 NOTE — Progress Notes (Addendum)
Initial Nutrition Assessment  DOCUMENTATION CODES:     INTERVENTION:  Since pt intake is so poor - resume bolus feedings of  Glucerna 1.2 @  300 ml QID.   Add 30 ml Prostat and JUVEN BID via PEG.  Tube feeding regimen provides 1640 kcal, 102 grams of protein, and 966 ml of H2O.   IVF-NS@125  ml/hr   Continue to encourage meal intake ad lib  NUTRITION DIAGNOSIS:  Inadequate oral intake r/t severe neuro deficits; chronic alternate feeding method via PEG. Increased nutrient needs related to abscess and sepsis (acute).  GOAL: Pt to meet >/= 90% of their estimated nutrition needs      MONITOR: tube feeding tolerance, labs, skin assessments and weights    REASON FOR ASSESSMENT:   Consult Enteral/tube feeding initiation and management  ASSESSMENT: Joe Wilkinson is from [facility]. He has a hx of CVA August 2018 and is s/p craniectomy and PEG (placed initially 04/13/17) . He is PEG dependent for 100% of nutrition at SNF. Pulled his PEG out which was replaced with foley.  He presents to ED septic with gluteal abscess-expressing copious purulence per MD.   At SNF-Multiple failed attempts to wean him  from tube feeding to oral intake have been conducted. His diet is Diet at SNF dysphagia 2 with consistently poor oral intake (1-25%). He has been  consistently refuseing ensure enlive per nursing report.  Initial TF regimen: bolus feedings of 400 ml of Glucerna 1.2 TID via PEG. ProStat 30 ml daily and Free water 300 ml q 6 hrs daily. Provides 1540 kcal, 87 gr protein.  On 2/7 Tube feeding was changed to nocturnal 11p-7-a . Glucerna 1.2 @ 85 ml/hr x 8 hr + ProStat 30 ml to provide 916 kcal 55 gr protein each day.   Also Remeron was added 2/8 to potentially help improve appetite and oral intake.   Weight hx: wt loss the past 6 months since CVA approximately 25-30 lbs. Since January his wt has been stable 171-173 lb.    BMP Latest Ref Rng & Units 09/26/2017 09/25/2017 09/24/2017  Glucose 65 - 99 mg/dL 89  146(H) 141(H)  BUN 6 - 20 mg/dL 7 13 24(H)  Creatinine 0.61 - 1.24 mg/dL 0.55(L) 0.69 0.90  Sodium 135 - 145 mmol/L 136 141 140  Potassium 3.5 - 5.1 mmol/L 3.5 3.4(L) 4.2  Chloride 101 - 111 mmol/L 110 112(H) 103  CO2 22 - 32 mmol/L 19(L) 19(L) 26  Calcium 8.9 - 10.3 mg/dL 7.7(L) 8.0(L) 9.2     NUTRITION - FOCUSED PHYSICAL EXAM: No distinguishable changes  Diet Order:  DIET - DYS 1 Room service appropriate? Yes; Fluid consistency: Thin  EDUCATION NEEDS:   Skin:   gluteal abcess   Last BM:  2/17  Height:   Ht Readings from Last 1 Encounters:  09/24/17 5\' 6"  (1.676 m)    Weight:   Wt Readings from Last 1 Encounters:  09/26/17 181 lb 7 oz (82.3 kg)    Ideal Body Weight:     BMI:  Body mass index is 29.28 kg/m.  Estimated Nutritional Needs: Based on usual wt of 172# (78 kg)  Kcal:  1941-7408   Protein:  93-101 gr   Fluid:  1.9-2.0 liters daily  Colman Cater MS,RD,CSG,LDN Office: [phone] Pager: [phone]

## 2017-09-26 NOTE — Progress Notes (Signed)
[facility] Progress Note     Subjective: Speech says pureed diet and thin liquids. He has taken in minimal this AM. Says he does not want his PEG removed.   Objective: Vital signs in last 24 hours: Temp:  [98.1 F (36.7 C)-100.3 F (37.9 C)] 98.6 F (37 C) (02/19 0400) Pulse Rate:  [90-122] 104 (02/19 0800) Resp:  [0-33] 18 (02/19 0800) BP: (82-147)/(30-133) 111/74 (02/19 0800) SpO2:  [92 %-100 %] 99 % (02/19 0800) Weight:  [181 lb 7 oz (82.3 kg)] 181 lb 7 oz (82.3 kg) (02/19 0500) Last BM Date: 09/24/17  Intake/Output from previous day: 02/18 0701 - 02/19 0700 In: 600 [IV Piggyback:600] Out: 2250 [Urine:2250] Intake/Output this shift: No intake/output data recorded.  General appearance: alert, cooperative and no distress Resp: normal work breathing left buttock with penrose in place, purulent drainage on ABD  Lab Results:  Recent Labs    09/25/17 0531 09/26/17 0439  WBC 25.0* 19.4*  HGB 10.1* 9.4*  HCT 33.7* 31.2*  PLT 290 311   BMET Recent Labs    09/25/17 0531 09/26/17 0439  NA 141 136  K 3.4* 3.5  CL 112* 110  CO2 19* 19*  GLUCOSE 146* 89  BUN 13 7  CREATININE 0.69 0.55*  CALCIUM 8.0* 7.7*   PT/INR Recent Labs    09/24/17 1527  LABPROT 16.2*  INR 1.31    Studies/Results: Ct Abdomen Pelvis W Contrast  Result Date: 09/24/2017 CLINICAL DATA:  Abdominal pain at PEG tube site after pulling at out accidentally. Weakness and increased pain to right-sided flank. EXAM: CT ABDOMEN AND PELVIS WITH CONTRAST TECHNIQUE: Multidetector CT imaging of the abdomen and pelvis was performed using the standard protocol following bolus administration of intravenous contrast. CONTRAST:  180mL ISOVUE-300 IOPAMIDOL (ISOVUE-300) INJECTION 61% COMPARISON:  CT abdomen dated 04/22/2017. FINDINGS: Lower chest: No acute abnormality. Hepatobiliary: Stable hemangioma within the left liver lobe. No acute or suspicious abnormality within the liver. Gallbladder is  not seen, presumed cholecystectomy. No bile duct dilatation. Pancreas: Unremarkable. No pancreatic ductal dilatation or surrounding inflammatory changes. Spleen: Normal in size without focal abnormality. Adrenals/Urinary Tract: Adrenal glands appear normal. Kidneys appear normal without mass, stone or hydronephrosis. Bladder appears normal. Stomach/Bowel: Worsening appearance of the perianal soft tissues. There is now a defined abscess collection to the left of the gluteal fold, perianal, measuring at least 6.7 x 3.4 cm, incompletely imaged at the lower aspects of the exam. Smaller collection to the right of the gluteal fold, measuring 2.3 cm, also incompletely imaged. An additional 1.9 cm collection more centrally near the anorectal junction. Associated thickening of the walls of the lower rectum. No dilated large or small bowel loops. Appendix is normal. Per clinical data, Foley catheter tube in place of the PEG tube, well positioned in the stomach. No fluid collection or evidence of inflammatory change at the catheter entrance site. Vascular/Lymphatic: No significant vascular findings are present. No enlarged abdominal or pelvic lymph nodes. Reproductive: Mild prominence of the prostate gland causing slight mass effect on the bladder base. Other: No free intraperitoneal air. Musculoskeletal: No acute or suspicious osseous finding. Mild degenerative change throughout the thoracolumbar spine. IMPRESSION: 1. Per report, Foley catheter was placed after PEG tube was accidentally removed. The Foley catheter appears well positioned in the stomach. No fluid collection, inflammatory change or other procedural complicating feature at the PEG tube insertion site. 2. WORSENED appearance of the PERIANAL soft tissues. There is now a defined abscess collection to the left  of the gluteal fold, perianal, measuring at least 6.7 cm greatest dimension, but incompletely imaged at the lower aspects of the exam. Smaller abscess  collection to the right of the gluteal fold measures 2.3 cm, also incompletely imaged. Additional 1.9 cm abscess collection more centrally near the anal rectal junction. Associated thickening of the walls of the lower rectum, with surrounding perirectal inflammation, but no extension of the defined abscess collection into the perirectal fat of the lower pelvis. 3. Additional chronic/incidental findings detailed above. Electronically Signed   By: Franki Cabot M.D.   On: 09/24/2017 22:29   Dg Chest Portable 1 View  Result Date: 09/24/2017 CLINICAL DATA:  Fever. EXAM: PORTABLE CHEST 1 VIEW COMPARISON:  05/26/2017 FINDINGS: The heart size and mediastinal contours are within normal limits. Both lungs are clear. The visualized skeletal structures are unremarkable. IMPRESSION: No active disease. Electronically Signed   By: Kerby Moors M.D.   On: 09/24/2017 16:15    Anti-infectives: Anti-infectives (From admission, onward)   Start     Dose/Rate Route Frequency Ordered Stop   09/25/17 2200  ceFEPIme (MAXIPIME) 500 mg in dextrose 5 % 50 mL IVPB  Status:  Discontinued     500 mg 100 mL/hr over 30 Minutes Intravenous Every 8 hours 09/24/17 1920 09/25/17 0134   09/25/17 1000  metroNIDAZOLE (FLAGYL) IVPB 500 mg     500 mg 100 mL/hr over 60 Minutes Intravenous Every 8 hours 09/25/17 0912     09/25/17 0500  vancomycin (VANCOCIN) IVPB 1000 mg/200 mL premix     1,000 mg 200 mL/hr over 60 Minutes Intravenous Every 12 hours 09/25/17 0204     09/25/17 0230  ceFEPIme (MAXIPIME) 1 g in sodium chloride 0.9 % 100 mL IVPB     1 g 200 mL/hr over 30 Minutes Intravenous Every 8 hours 09/25/17 0159     09/25/17 0145  ceFEPIme (MAXIPIME) 500 mg in dextrose 5 % 50 mL IVPB  Status:  Discontinued     500 mg 100 mL/hr over 30 Minutes Intravenous Every 8 hours 09/25/17 0134 09/25/17 0159   09/24/17 1530  vancomycin (VANCOCIN) IVPB 1000 mg/200 mL premix     1,000 mg 200 mL/hr over 60 Minutes Intravenous  Once 09/24/17  1515 09/24/17 1730   09/24/17 1530  piperacillin-tazobactam (ZOSYN) IVPB 3.375 g     3.375 g 100 mL/hr over 30 Minutes Intravenous  Once 09/24/17 1515 09/24/17 1629      Assessment/Plan: Mr. Kelemen is a 57 yo with a left gluteal/ perianal abscess with penrose in place for drainage. Continues to drain. WBC down to 19 from 25.  -Continue IV Antibiotics for today, leave drain in place again today, may have to go out with drain and follow up for removal if otherwise improving  -Encouraged oral intake, but do not think patient will be able to take in enough to remove the PEG, only took in 25% of the breakfast -Would use PEG as needed for now    LOS: 2 days    Virl Cagey 09/26/2017

## 2017-09-27 ENCOUNTER — Telehealth: Payer: Self-pay

## 2017-09-27 ENCOUNTER — Encounter (HOSPITAL_COMMUNITY)
Admission: RE | Admit: 2017-09-27 | Discharge: 2017-09-27 | Disposition: A | Discharge status: Home / Self Care | Payer: Medicaid Other | Source: Skilled Nursing Facility | Attending: Internal Medicine | Admitting: Internal Medicine

## 2017-09-27 ENCOUNTER — Inpatient Hospital Stay (HOSPITAL_COMMUNITY): Payer: Medicaid Other

## 2017-09-27 DIAGNOSIS — A419 Sepsis, unspecified organism: Principal | ICD-10-CM

## 2017-09-27 DIAGNOSIS — E1165 Type 2 diabetes mellitus with hyperglycemia: Secondary | ICD-10-CM | POA: Insufficient documentation

## 2017-09-27 DIAGNOSIS — E1169 Type 2 diabetes mellitus with other specified complication: Secondary | ICD-10-CM

## 2017-09-27 DIAGNOSIS — Z5189 Encounter for other specified aftercare: Secondary | ICD-10-CM | POA: Insufficient documentation

## 2017-09-27 DIAGNOSIS — I10 Essential (primary) hypertension: Secondary | ICD-10-CM

## 2017-09-27 DIAGNOSIS — I63311 Cerebral infarction due to thrombosis of right middle cerebral artery: Secondary | ICD-10-CM | POA: Insufficient documentation

## 2017-09-27 DIAGNOSIS — K610 Anal abscess: Secondary | ICD-10-CM

## 2017-09-27 DIAGNOSIS — Z794 Long term (current) use of insulin: Secondary | ICD-10-CM

## 2017-09-27 DIAGNOSIS — D6489 Other specified anemias: Secondary | ICD-10-CM | POA: Insufficient documentation

## 2017-09-27 LAB — CBC
HEMATOCRIT: 33.8 % — AB (ref 39.0–52.0)
Hemoglobin: 10.2 g/dL — ABNORMAL LOW (ref 13.0–17.0)
MCH: 27 pg (ref 26.0–34.0)
MCHC: 30.2 g/dL (ref 30.0–36.0)
MCV: 89.4 fL (ref 78.0–100.0)
Platelets: 366 10*3/uL (ref 150–400)
RBC: 3.78 MIL/uL — AB (ref 4.22–5.81)
RDW: 15.3 % (ref 11.5–15.5)
WBC: 18.8 10*3/uL — ABNORMAL HIGH (ref 4.0–10.5)

## 2017-09-27 LAB — GLUCOSE, CAPILLARY
GLUCOSE-CAPILLARY: 76 mg/dL (ref 65–99)
GLUCOSE-CAPILLARY: 99 mg/dL (ref 65–99)
GLUCOSE-CAPILLARY: 118 mg/dL — AB (ref 65–99)
Glucose-Capillary: 101 mg/dL — ABNORMAL HIGH (ref 65–99)
Glucose-Capillary: 115 mg/dL — ABNORMAL HIGH (ref 65–99)
Glucose-Capillary: 80 mg/dL (ref 65–99)

## 2017-09-27 LAB — VANCOMYCIN, TROUGH: VANCOMYCIN TR: 11 ug/mL — AB (ref 15–20)

## 2017-09-27 MED ORDER — ACETAMINOPHEN 325 MG PO TABS
650.0000 mg | ORAL_TABLET | ORAL | Status: DC | PRN
  Administered 2017-09-27 – 2017-09-29 (×2): 650 mg via ORAL
  Filled 2017-09-27 (×3): qty 2

## 2017-09-27 MED ORDER — VANCOMYCIN HCL 10 G IV SOLR
1250.0000 mg | Freq: Two times a day (BID) | INTRAVENOUS | Status: DC
  Administered 2017-09-27 – 2017-09-29 (×4): 1250 mg via INTRAVENOUS
  Filled 2017-09-27 (×10): qty 1250

## 2017-09-27 MED ORDER — ACETAMINOPHEN 650 MG RE SUPP
650.0000 mg | RECTAL | Status: DC | PRN
Start: 2017-09-27 — End: 2017-09-30

## 2017-09-27 MED ORDER — IOPAMIDOL (ISOVUE-300) INJECTION 61%
100.0000 mL | Freq: Once | INTRAVENOUS | Status: AC | PRN
  Administered 2017-09-27: 100 mL via INTRAVENOUS

## 2017-09-27 NOTE — Progress Notes (Signed)
Inpatient Diabetes Program Recommendations  AACE/ADA: New Consensus Statement on Inpatient Glycemic Control (2015)  Target Ranges:  Prepandial:   less than 140 mg/dL      Peak postprandial:   less than 180 mg/dL (1-2 hours)      Critically ill patients:  140 - 180 mg/dL   Results for TANAV, ORSAK (MRN [id number]) as of 09/27/2017 10:10  Ref. Range 09/26/2017 07:56 09/26/2017 12:02 09/26/2017 16:51 09/26/2017 20:03 09/27/2017 00:24 09/27/2017 04:57 09/27/2017 07:40  Glucose-Capillary Latest Ref Range: 65 - 99 mg/dL 91 92 80 67 101 (H) 76 80   Review of Glycemic Control  Diabetes history: DM2 Outpatient Diabetes medications: Levemir 34 units daily Current orders for Inpatient glycemic control: Levemir 30 units daily, Novolog 0-15 units Q4H  Inpatient Diabetes Program Recommendations: Insulin - Basal: Please consider slightly decreasing Levemir to 27 units daily.  Thanks, Barnie Alderman, RN, MSN, CDE Diabetes Coordinator Inpatient Diabetes Program [phone] (Team Pager from 8am to 5pm)

## 2017-09-27 NOTE — Progress Notes (Signed)
[facility] Progress Note     Subjective: No reported issues. Stool on backside on examination.   Objective: Vital signs in last 24 hours: Temp:  [98.8 F (37.1 C)-101.1 F (38.4 C)] 98.8 F (37.1 C) (02/20 0503) Pulse Rate:  [96-108] 96 (02/20 0503) Resp:  [11-22] 20 (02/20 0503) BP: (111-131)/(66-78) 126/78 (02/20 0503) SpO2:  [96 %-100 %] 100 % (02/20 0503) Weight:  [180 lb 16 oz (82.1 kg)] 180 lb 16 oz (82.1 kg) (02/20 0503) Last BM Date: 09/27/17  Intake/Output from previous day: 02/19 0701 - 02/20 0700 In: 7135 [P.O.:60; I.V.:5875; IV Piggyback:1200] Out: 3850 [Urine:3850] Intake/Output this shift: No intake/output data recorded.  General appearance: alert, cooperative and no distress Resp: normal work breathing left buttock drain in place, tender, right perianal area tender, stool over all area, cleaned  Lab Results:  Recent Labs    09/26/17 0439 09/27/17 1142  WBC 19.4* 18.8*  HGB 9.4* 10.2*  HCT 31.2* 33.8*  PLT 311 366   BMET Recent Labs    09/25/17 0531 09/26/17 0439  NA 141 136  K 3.4* 3.5  CL 112* 110  CO2 19* 19*  GLUCOSE 146* 89  BUN 13 7  CREATININE 0.69 0.55*  CALCIUM 8.0* 7.7*   Personally reviewed CT scan- continued area of abscess posteriorly and on the right consistent with more of a horseshoe type abscess  Studies/Results: Ct Pelvis W Contrast  Result Date: 09/27/2017 CLINICAL DATA:  Left buttock pain for 3 days. Evaluate for left gluteal abscess. EXAM: CT PELVIS WITH CONTRAST TECHNIQUE: Multidetector CT imaging of the pelvis was performed using the standard protocol following the bolus administration of intravenous contrast. CONTRAST:  147mL ISOVUE-300 IOPAMIDOL (ISOVUE-300) INJECTION 61% COMPARISON:  09/24/2017 FINDINGS: Since the study of 3 days ago, there has been placement of a drainage catheter in the left buttock abscess. The left-sided collection is largely evacuated. There is some regional edematous change.  Small fluid collection to the right-sided midline in the right fatty buttock persists, measuring about 2.8 cm in diameter. Small left posterior perirectal abscess persists, 2 cm in diameter. No new or progressive finding. No evidence of deep space infection. In the pelvis itself, there is no finding of note. IMPRESSION: Catheter drainage of a left buttock abscess. Some persistent collection in the right buttock fatty tissues, measuring 2.8 cm in maximal diameter. Small persistent left posterior perirectal/perianal collection unchanged at 2 cm in diameter. Electronically Signed   By: Nelson Chimes M.D.   On: 09/27/2017 15:09    Anti-infectives: Anti-infectives (From admission, onward)   Start     Dose/Rate Route Frequency Ordered Stop   09/25/17 2200  ceFEPIme (MAXIPIME) 500 mg in dextrose 5 % 50 mL IVPB  Status:  Discontinued     500 mg 100 mL/hr over 30 Minutes Intravenous Every 8 hours 09/24/17 1920 09/25/17 0134   09/25/17 1000  metroNIDAZOLE (FLAGYL) IVPB 500 mg     500 mg 100 mL/hr over 60 Minutes Intravenous Every 8 hours 09/25/17 0912     09/25/17 0500  vancomycin (VANCOCIN) IVPB 1000 mg/200 mL premix     1,000 mg 200 mL/hr over 60 Minutes Intravenous Every 12 hours 09/25/17 0204     09/25/17 0230  ceFEPIme (MAXIPIME) 1 g in sodium chloride 0.9 % 100 mL IVPB     1 g 200 mL/hr over 30 Minutes Intravenous Every 8 hours 09/25/17 0159     09/25/17 0145  ceFEPIme (MAXIPIME) 500 mg in dextrose 5 % 50  mL IVPB  Status:  Discontinued     500 mg 100 mL/hr over 30 Minutes Intravenous Every 8 hours 09/25/17 0134 09/25/17 0159   09/24/17 1530  vancomycin (VANCOCIN) IVPB 1000 mg/200 mL premix     1,000 mg 200 mL/hr over 60 Minutes Intravenous  Once 09/24/17 1515 09/24/17 1730   09/24/17 1530  piperacillin-tazobactam (ZOSYN) IVPB 3.375 g     3.375 g 100 mL/hr over 30 Minutes Intravenous  Once 09/24/17 1515 09/24/17 1629      Assessment/Plan: Joe Wilkinson is a 57 yo with history of stroke Right  craniectomy who has a perianal abscess that was partially drained at the bedside but needs further drainage in the operating room. He looks like he likely has some horseshoe component due to the extension posteriorly and to the right.  -OR tomorrow for incision and drainage -Hold eliquis -NPO at midnight    LOS: 3 days    Virl Cagey 09/27/2017

## 2017-09-27 NOTE — Plan of Care (Signed)
Pt had fever relieved with Tylenol. Pt also refusing to take PO meds. NSD. Will continue to monitor.

## 2017-09-27 NOTE — Progress Notes (Signed)
SLP Cancellation Note  Patient Details Name: Joe Wilkinson MRN: [id number] DOB: [birth year]   Cancelled treatment:       Reason Eval/Treat Not Completed: Patient at procedure or test/unavailable; Pt off the floor for CT. Pt was on D2 and thin at [facility], however PO intake was limited and Pt predominantly relied on PEG feedings. Will advance diet to D2 and thin and SLP will sign off. Reconsult if indicated.   Thank you,  Genene Churn, [place]   [place] 09/27/2017, 2:40 PM

## 2017-09-27 NOTE — Progress Notes (Signed)
Per Dr. Constance Haw hold 2200 dose of Eliquis (09/27/17).

## 2017-09-27 NOTE — Telephone Encounter (Signed)
Possible re-admission to facility. This is a patient you were seeing at [facility] . [facility] F/U is needed if patient was re-admitted to facility upon discharge. Hospital discharge from [place] on 09/24/2017

## 2017-09-27 NOTE — Progress Notes (Signed)
PROGRESS NOTE    Joe Wilkinson  XNA:[id number] DOB: [birth year] DOA: 09/24/2017 PCP: Virgie Dad, MD  Brief Narrative:  This is a 57 year old male with history of prior CVA and left-sided hemiparesis along with PEG tube, diabetes, and hypertension who was brought to the ED due to "feeling bad."  He apparently pulled his PEG tube out which was replaced with a Foley catheter.  He was placed on vancomycin and cefepime for sepsis that appears to be associated to a left gluteal abscess.  IV Flagyl has been added this morning for better anaerobic coverage.  This was evaluated by general surgery earlier today and the patient began to spontaneously drain and had some debridement of loculations with a Penrose drain placed.  He has been recommended to have a speech evaluation performed on account of the fact that he does have some oral intake.  BP stable w/o pressors. continue spiking fever. Will need further I&D.  Assessment & Plan:   Active Problems:   Sepsis ([place])   Abscess, perianal   Dislodged gastrostomy tube ([place])   1. Septic shock likely secondary to left-sided gluteal abscess.  Blood culture thus far has remained negative.  Appreciate general surgery evaluation for I&D.  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 General surgery planning to taking ot OR in am for repeat I&D and cleaning. Continue current antibiotics. Follow culture results.  2. Displaced PEG tube.  Continue with Foley catheter at PEG site.  This can be used as needed for feeding.  Will follow speech/swallow therapy recommendations and further  decide whether or not this will be needed in the near future. 3. Hypertension- stable w/o meds. Will continue to monitor while holding antihypertensive emds 4. Diabetes.  Fair control. Will continue current hypoglycemic regimen  5. Prior CVA.  Plan is to eventually resume eliquis; no on  hold with anticipated I&D again in am to drain abscess on right gluteal region.   DVT prophylaxis:SCDs Code Status: Full Family Communication: None Disposition Plan: TBD, but anticipate discharge back to SNF once medically stable.    Consultants:   General Surgery  Procedures:   L gluteal abscess bedside I/D 2/18  Antimicrobials:   Vancomycin and Cefepime 2/17->  Flagyl 2/18->   Subjective: Fever overnight, denying CP and SOB. No major pain on his recently I&D area.   Objective: Vitals:   09/26/17 1800 09/26/17 2150 09/27/17 0210 09/27/17 0503  BP: 111/68 131/69  126/78  Pulse: (!) 106 99  96  Resp: (!) 22 20  20   Temp:  (!) 101.1 F (38.4 C) 98.8 F (37.1 C) 98.8 F (37.1 C)  TempSrc:  Oral Oral Oral  SpO2: 97% 97%  100%  Weight:    82.1 kg (180 lb 16 oz)  Height:        Intake/Output Summary (Last 24 hours) at 09/27/2017 1913 Last data filed at 09/27/2017 1728 Gross per 24 hour  Intake 3718.33 ml  Output 2750 ml  Net 968.33 ml   Filed Weights   09/25/17 0500 09/26/17 0500 09/27/17 0503  Weight: 81.3 kg (179 lb 3.7 oz) 82.3 kg (181 lb 7 oz) 82.1 kg (180 lb 16 oz)    Examination: General exam: continue spiking fever (Temp overnight 101.3); reports no pain. No CP and no SOB> Respiratory system: good air movement bilaterally, no wheezing, no crackles  Cardiovascular system: S1 and S2, no rubs, no gallops, no JVD  Gastrointestinal system: soft, NT, ND, positive BS Central nervous system: AAOX3, no new deficit appreciated. Chronic left hemiplegia as residual deficit from previous stroke.  Extremities: some atrophy appreciated on left side; no cyanosis, no clubbing  Skin: Patient with open left abscess on his left buttock with drain in place. Psychiatry: Normal judgment, good insight.  Appropriate mood.  Able to follow commands appropriately.    Data Reviewed: I have personally reviewed following labs and imaging studies  CBC: Recent Labs  Lab  09/24/17 1527 09/25/17 0531 09/26/17 0439 09/27/17 1142  WBC 23.8* 25.0* 19.4* 18.8*  NEUTROABS 19.0*  --   --   --   HGB 11.9* 10.1* 9.4* 10.2*  HCT 38.9* 33.7* 31.2* 33.8*  MCV 90.7 91.3 90.2 89.4  PLT 271 290 311 403   Basic Metabolic Panel: Recent Labs  Lab 09/24/17 1527 09/25/17 0531 09/25/17 0545 09/26/17 0439  NA 140 141  --  136  K 4.2 3.4*  --  3.5  CL 103 112*  --  110  CO2 26 19*  --  19*  GLUCOSE 141* 146*  --  89  BUN 24* 13  --  7  CREATININE 0.90 0.69  --  0.55*  CALCIUM 9.2 8.0*  --  7.7*  MG  --   --  2.1  --   PHOS 2.2*  --   --   --    GFR: Estimated Creatinine Clearance: 103.7 mL/min (A) (by C-G formula based on SCr of 0.55 mg/dL (L)).   Liver Function Tests: Recent Labs  Lab 09/24/17 1527  AST 34  ALT 59  ALKPHOS 102  BILITOT 1.0  PROT 8.3*  ALBUMIN 2.6*   Recent Labs  Lab 09/24/17 1527  LIPASE 35   Coagulation Profile: Recent Labs  Lab 09/24/17 1527  INR 1.31   CBG: Recent Labs  Lab 09/27/17 0024 09/27/17 0457 09/27/17 0740 09/27/17 1129 09/27/17 1740  GLUCAP 101* 76 80 99 115*   Sepsis Labs: Recent Labs  Lab 09/24/17 1543 09/26/17 0439  LATICACIDVEN 1.55 0.5    Recent Results (from the past 240 hour(s))  Culture, blood (Routine x 2)     Status: None (Preliminary result)   Collection Time: 09/24/17  3:29 PM  Result Value Ref Range Status   Specimen Description   Final    BLOOD LEFT ANTECUBITAL Blood Culture adequate volume BOTTLES DRAWN AEROBIC ONLY   Special Requests NONE  Final   Culture   Final    NO GROWTH 3 DAYS Performed at [facility], [address] [place], [place] [address]    Report Status PENDING  Incomplete  MRSA PCR Screening     Status: None   Collection Time: 09/24/17 11:30 PM  Result Value Ref Range Status   MRSA by PCR NEGATIVE NEGATIVE Final    Comment:        The GeneXpert MRSA Assay (FDA approved for NASAL specimens only), is one component of a comprehensive MRSA  colonization surveillance program. It is not intended to diagnose MRSA infection nor to guide or monitor treatment for MRSA infections. Performed at [facility], [address], [place], [place] [address]   Aerobic Culture (superficial specimen)     Status: None (Preliminary result)   Collection Time: 09/25/17  1:55 PM  Result Value Ref Range Status   Specimen Description   Final    ABSCESS Performed at [facility], [address] [place],  [address]    Special Requests   Final  Normal Performed at [address] [place], [place] [address]    Gram Stain   Final    MODERATE WBC PRESENT, PREDOMINANTLY PMN FEW GRAM POSITIVE COCCI IN PAIRS IN CLUSTERS RARE GRAM NEGATIVE RODS    Culture   Final    CULTURE REINCUBATED FOR BETTER GROWTH Performed at [facility], [place] [address], [place], [place] [address]    Report Status PENDING  Incomplete     Radiology Studies: Ct Pelvis W Contrast  Result Date: 09/27/2017 CLINICAL DATA:  Left buttock pain for 3 days. Evaluate for left gluteal abscess. EXAM: CT PELVIS WITH CONTRAST TECHNIQUE: Multidetector CT imaging of the pelvis was performed using the standard protocol following the bolus administration of intravenous contrast. CONTRAST:  11mL ISOVUE-300 IOPAMIDOL (ISOVUE-300) INJECTION 61% COMPARISON:  09/24/2017 FINDINGS: Since the study of 3 days ago, there has been placement of a drainage catheter in the left buttock abscess. The left-sided collection is largely evacuated. There is some regional edematous change. Small fluid collection to the right-sided midline in the right fatty buttock persists, measuring about 2.8 cm in diameter. Small left posterior perirectal abscess persists, 2 cm in diameter. No new or progressive finding. No evidence of deep space infection. In the pelvis itself, there is no finding of note. IMPRESSION: Catheter drainage of a left buttock abscess. Some persistent collection in the right  buttock fatty tissues, measuring 2.8 cm in maximal diameter. Small persistent left posterior perirectal/perianal collection unchanged at 2 cm in diameter. Electronically Signed   By: Nelson Chimes M.D.   On: 09/27/2017 15:09   Scheduled Meds: . chlorhexidine  15 mL Mouth Rinse BID  . Chlorhexidine Gluconate Cloth  6 each Topical Daily  . feeding supplement (ENSURE ENLIVE)  237 mL Oral BID BM  . feeding supplement (GLUCERNA 1.2 CAL)  300 mL Per Tube QID  . feeding supplement (PRO-STAT SUGAR FREE 64)  30 mL Oral BID  . insulin aspart  0-15 Units Subcutaneous Q4H  . insulin detemir  30 Units Subcutaneous Daily  . nutrition supplement (JUVEN)  1 packet Oral BID BM  . pantoprazole (PROTONIX) IV  40 mg Intravenous Q24H  . polyethylene glycol  17 g Per Tube Daily  . sodium chloride flush  10-40 mL Intracatheter Q12H  . sodium chloride flush  3 mL Intravenous Q12H   Continuous Infusions: . sodium chloride 125 mL/hr at 09/27/17 0634  . ceFEPime (MAXIPIME) IV Stopped (09/27/17 1728)  . metronidazole Stopped (09/27/17 1728)  . norepinephrine (LEVOPHED) Adult infusion 1 mcg/min (09/25/17 2115)  . vancomycin 1,250 mg (09/27/17 1800)     LOS: 3 days    Time spent: 30 minutes    Barton Dubois, MD [facility] Pager [phone]  If 7PM-7AM, please contact night-coverage [email or web address] Password [email or web address] 09/27/2017, 7:13 PM

## 2017-09-27 NOTE — Progress Notes (Signed)
[place] for Vancomycin Indication: sepsis, gluteal abscess  Allergies  Allergen Reactions  . Penicillins Hives    Patient Measurements: Height: 5\' 6"  (167.6 cm) Weight: 180 lb 16 oz (82.1 kg) IBW/kg (Calculated) : 63.8 kg   Vital Signs: Temp: 98.8 F (37.1 C) (02/20 0503) Temp Source: Oral (02/20 0503) BP: 126/78 (02/20 0503) Pulse Rate: 96 (02/20 0503)  Labs: Recent Labs    09/25/17 0531 09/26/17 0439 09/27/17 1142  WBC 25.0* 19.4* 18.8*  HGB 10.1* 9.4* 10.2*  PLT 290 311 366  CREATININE 0.69 0.55*  --     Estimated Creatinine Clearance: 103.7 mL/min (A) (by C-G formula based on SCr of 0.55 mg/dL (L)).   Microbiology: Recent Results (from the past 720 hour(s))  Culture, blood (Routine x 2)     Status: None (Preliminary result)   Collection Time: 09/24/17  3:29 PM  Result Value Ref Range Status   Specimen Description   Final    BLOOD LEFT ANTECUBITAL Blood Culture adequate volume BOTTLES DRAWN AEROBIC ONLY   Special Requests NONE  Final   Culture   Final    NO GROWTH 3 DAYS Performed at [facility], [address] [place], [place] [address]    Report Status PENDING  Incomplete  MRSA PCR Screening     Status: None   Collection Time: 09/24/17 11:30 PM  Result Value Ref Range Status   MRSA by PCR NEGATIVE NEGATIVE Final    Comment:        The GeneXpert MRSA Assay (FDA approved for NASAL specimens only), is one component of a comprehensive MRSA colonization surveillance program. It is not intended to diagnose MRSA infection nor to guide or monitor treatment for MRSA infections. Performed at [facility], [address], [place], [place] [address]   Aerobic Culture (superficial specimen)     Status: None (Preliminary result)   Collection Time: 09/25/17  1:55 PM  Result Value Ref Range Status   Specimen Description   Final    ABSCESS Performed at [facility], [address], [place], [place] [address]    Special  Requests   Final    Normal Performed at [facility], [address] [place], [place] [address]    Gram Stain   Final    MODERATE WBC PRESENT, PREDOMINANTLY PMN FEW GRAM POSITIVE COCCI IN PAIRS IN CLUSTERS RARE GRAM NEGATIVE RODS    Culture   Final    CULTURE REINCUBATED FOR BETTER GROWTH Performed at [facility], [place] [address] [place],  [address]    Report Status PENDING  Incomplete    Medical History: Past Medical History:  Diagnosis Date  . DM (diabetes mellitus) ([place])   . Hypertension   . Stroke [facility])     Medications:  Anti-infectives (From admission, onward)   Start     Dose/Rate Route Frequency Ordered Stop   09/25/17 2200  ceFEPIme (MAXIPIME) 500 mg in dextrose 5 % 50 mL IVPB  Status:  Discontinued     500 mg 100 mL/hr over 30 Minutes Intravenous Every 8 hours 09/24/17 1920 09/25/17 0134   09/25/17 1000  metroNIDAZOLE (FLAGYL) IVPB 500 mg     500 mg 100 mL/hr over 60 Minutes Intravenous Every 8 hours 09/25/17 0912     09/25/17 0500  vancomycin (VANCOCIN) IVPB 1000 mg/200 mL premix     1,000 mg 200 mL/hr over 60 Minutes Intravenous Every 12 hours 09/25/17 0204     09/25/17 0230  ceFEPIme (MAXIPIME) 1 g in sodium chloride 0.9 % 100 mL IVPB     1 g 200 mL/hr over 30 Minutes Intravenous Every 8 hours 09/25/17 0159     09/25/17 0145  ceFEPIme (MAXIPIME) 500 mg in dextrose 5 % 50 mL IVPB  Status:  Discontinued     500 mg 100 mL/hr over 30 Minutes Intravenous Every 8 hours 09/25/17 0134 09/25/17 0159   09/24/17 1530  vancomycin (VANCOCIN) IVPB 1000 mg/200 mL premix     1,000 mg 200 mL/hr over 60 Minutes Intravenous  Once 09/24/17 1515 09/24/17 1730   09/24/17 1530  piperacillin-tazobactam (ZOSYN) IVPB 3.375 g     3.375 g 100 mL/hr over 30 Minutes Intravenous  Once 09/24/17 1515 09/24/17 1629      Assessment: 57 yr old male admitted for r/o sepsis, gluteal abscess.  His vanc trough today was 11.0 mcg/ml Goal of Therapy:  Vancomycin trough level  15-20 mcg/ml  Plan:  Increase vancomycin to 1250 mg q12 hours F/u renal function, cultures and clinical course  Beverlee Nims, RPH 09/27/2017,4:50 PM

## 2017-09-28 ENCOUNTER — Encounter (HOSPITAL_COMMUNITY): Payer: Self-pay | Admitting: *Deleted

## 2017-09-28 ENCOUNTER — Encounter (HOSPITAL_COMMUNITY)
Admission: EM | Disposition: A | Discharge status: Skilled Nursing Facility | Payer: Self-pay | Source: Home / Self Care | Attending: Internal Medicine

## 2017-09-28 ENCOUNTER — Inpatient Hospital Stay (HOSPITAL_COMMUNITY): Payer: Medicaid Other | Admitting: Anesthesiology

## 2017-09-28 DIAGNOSIS — Z0271 Encounter for disability determination: Secondary | ICD-10-CM

## 2017-09-28 HISTORY — PX: INCISION AND DRAINAGE PERIRECTAL ABSCESS: SHX1804

## 2017-09-28 LAB — GLUCOSE, CAPILLARY
GLUCOSE-CAPILLARY: 91 mg/dL (ref 65–99)
GLUCOSE-CAPILLARY: 99 mg/dL (ref 65–99)
Glucose-Capillary: 100 mg/dL — ABNORMAL HIGH (ref 65–99)
Glucose-Capillary: 102 mg/dL — ABNORMAL HIGH (ref 65–99)
Glucose-Capillary: 115 mg/dL — ABNORMAL HIGH (ref 65–99)
Glucose-Capillary: 154 mg/dL — ABNORMAL HIGH (ref 65–99)
Glucose-Capillary: 90 mg/dL (ref 65–99)

## 2017-09-28 LAB — AEROBIC CULTURE W GRAM STAIN (SUPERFICIAL SPECIMEN)
Culture: NORMAL
Special Requests: NORMAL

## 2017-09-28 LAB — AEROBIC CULTURE  (SUPERFICIAL SPECIMEN)

## 2017-09-28 SURGERY — INCISION AND DRAINAGE, ABSCESS, PERIRECTAL
Anesthesia: General

## 2017-09-28 MED ORDER — CHLORHEXIDINE GLUCONATE CLOTH 2 % EX PADS: 6.0000 | MEDICATED_PAD | Freq: Once | CUTANEOUS | Status: DC

## 2017-09-28 MED ORDER — FENTANYL CITRATE (PF) 100 MCG/2ML IJ SOLN
INTRAMUSCULAR | Status: DC | PRN
  Administered 2017-09-28 (×4): 50 ug via INTRAVENOUS

## 2017-09-28 MED ORDER — FENTANYL CITRATE (PF) 100 MCG/2ML IJ SOLN
INTRAMUSCULAR | Status: AC
  Filled 2017-09-28: qty 2

## 2017-09-28 MED ORDER — MORPHINE SULFATE (PF) 2 MG/ML IV SOLN
2.0000 mg | INTRAVENOUS | Status: DC | PRN
  Administered 2017-09-28 – 2017-09-30 (×9): 2 mg via INTRAVENOUS
  Filled 2017-09-28 (×9): qty 1

## 2017-09-28 MED ORDER — SUGAMMADEX SODIUM 200 MG/2ML IV SOLN
INTRAVENOUS | Status: DC | PRN
  Administered 2017-09-28: 163 mg via INTRAVENOUS

## 2017-09-28 MED ORDER — PROPOFOL 10 MG/ML IV BOLUS
INTRAVENOUS | Status: DC | PRN
  Administered 2017-09-28: 110 mg via INTRAVENOUS

## 2017-09-28 MED ORDER — PHENYLEPHRINE HCL 10 MG/ML IJ SOLN
INTRAMUSCULAR | Status: DC | PRN
  Administered 2017-09-28 (×4): 80 ug via INTRAVENOUS

## 2017-09-28 MED ORDER — LIDOCAINE HCL (PF) 1 % IJ SOLN
INTRAMUSCULAR | Status: AC
  Filled 2017-09-28: qty 5

## 2017-09-28 MED ORDER — SUCCINYLCHOLINE CHLORIDE 20 MG/ML IJ SOLN
INTRAMUSCULAR | Status: DC | PRN
  Administered 2017-09-28: 120 mg via INTRAVENOUS

## 2017-09-28 MED ORDER — SUCCINYLCHOLINE CHLORIDE 20 MG/ML IJ SOLN
INTRAMUSCULAR | Status: AC
  Filled 2017-09-28: qty 1

## 2017-09-28 MED ORDER — LIDOCAINE HCL (CARDIAC) 20 MG/ML IV SOLN
INTRAVENOUS | Status: DC | PRN
  Administered 2017-09-28: 40 mg via INTRAVENOUS

## 2017-09-28 MED ORDER — MIDAZOLAM HCL 2 MG/2ML IJ SOLN
1.0000 mg | INTRAMUSCULAR | Status: AC
  Administered 2017-09-28: 2 mg via INTRAVENOUS

## 2017-09-28 MED ORDER — PHENYLEPHRINE 40 MCG/ML (10ML) SYRINGE FOR IV PUSH (FOR BLOOD PRESSURE SUPPORT)
PREFILLED_SYRINGE | INTRAVENOUS | Status: AC
  Filled 2017-09-28: qty 10

## 2017-09-28 MED ORDER — EPHEDRINE SULFATE 50 MG/ML IJ SOLN
INTRAMUSCULAR | Status: AC
  Filled 2017-09-28: qty 1

## 2017-09-28 MED ORDER — MIDAZOLAM HCL 2 MG/2ML IJ SOLN
INTRAMUSCULAR | Status: AC
  Filled 2017-09-28: qty 2

## 2017-09-28 MED ORDER — FENTANYL CITRATE (PF) 100 MCG/2ML IJ SOLN
25.0000 ug | INTRAMUSCULAR | Status: DC | PRN
  Administered 2017-09-28 (×3): 50 ug via INTRAVENOUS
  Filled 2017-09-28: qty 2

## 2017-09-28 MED ORDER — ARTIFICIAL TEARS OPHTHALMIC OINT
TOPICAL_OINTMENT | OPHTHALMIC | Status: AC
  Filled 2017-09-28: qty 7

## 2017-09-28 MED ORDER — ROCURONIUM BROMIDE 100 MG/10ML IV SOLN
INTRAVENOUS | Status: DC | PRN
  Administered 2017-09-28: 5 mg via INTRAVENOUS
  Administered 2017-09-28: 20 mg via INTRAVENOUS

## 2017-09-28 MED ORDER — HEPARIN SOD (PORK) LOCK FLUSH 100 UNIT/ML IV SOLN
INTRAVENOUS | Status: AC
  Filled 2017-09-28: qty 15

## 2017-09-28 MED ORDER — SUGAMMADEX SODIUM 200 MG/2ML IV SOLN
INTRAVENOUS | Status: AC
  Filled 2017-09-28: qty 2

## 2017-09-28 MED ORDER — SODIUM CHLORIDE 0.9 % IV SOLN
INTRAVENOUS | Status: DC
  Administered 2017-09-28: 1000 mL via INTRAVENOUS

## 2017-09-28 MED ORDER — 0.9 % SODIUM CHLORIDE (POUR BTL) OPTIME
TOPICAL | Status: DC | PRN
  Administered 2017-09-28: 1000 mL

## 2017-09-28 MED ORDER — PROPOFOL 10 MG/ML IV BOLUS
INTRAVENOUS | Status: AC
  Filled 2017-09-28: qty 40

## 2017-09-28 MED ORDER — OXYCODONE HCL 5 MG/5ML PO SOLN
5.0000 mg | ORAL | Status: DC | PRN
  Administered 2017-09-28: 5 mg via ORAL
  Filled 2017-09-28 (×2): qty 5

## 2017-09-28 SURGICAL SUPPLY — 24 items
BAG HAMPER (MISCELLANEOUS) ×2 IMPLANT
BNDG CONFORM 2 STRL LF (GAUZE/BANDAGES/DRESSINGS) ×2 IMPLANT
CLOTH BEACON ORANGE TIMEOUT ST (SAFETY) ×2 IMPLANT
COVER LIGHT HANDLE STERIS (MISCELLANEOUS) ×4 IMPLANT
DRAIN PENROSE 12X.25 LTX STRL (MISCELLANEOUS) ×4 IMPLANT
DRAPE PROXIMA HALF (DRAPES) ×2 IMPLANT
ELECT REM PT RETURN 9FT ADLT (ELECTROSURGICAL) ×2
ELECTRODE REM PT RTRN 9FT ADLT (ELECTROSURGICAL) ×1 IMPLANT
GAUZE SPONGE 4X4 12PLY STRL (GAUZE/BANDAGES/DRESSINGS) ×2 IMPLANT
GLOVE BIO SURGEON STRL SZ 6.5 (GLOVE) ×2 IMPLANT
GLOVE BIOGEL PI IND STRL 6.5 (GLOVE) ×1 IMPLANT
GLOVE BIOGEL PI IND STRL 7.0 (GLOVE) ×2 IMPLANT
GLOVE BIOGEL PI INDICATOR 6.5 (GLOVE) ×1
GLOVE BIOGEL PI INDICATOR 7.0 (GLOVE) ×2
GLOVE ECLIPSE 7.0 STRL STRAW (GLOVE) ×2 IMPLANT
GOWN STRL REUS W/TWL LRG LVL3 (GOWN DISPOSABLE) ×4 IMPLANT
KIT ROOM TURNOVER APOR (KITS) ×2 IMPLANT
MANIFOLD NEPTUNE II (INSTRUMENTS) ×2 IMPLANT
NS IRRIG 1000ML POUR BTL (IV SOLUTION) ×2 IMPLANT
PACK PERI GYN (CUSTOM PROCEDURE TRAY) ×2 IMPLANT
PAD ABD 5X9 TENDERSORB (GAUZE/BANDAGES/DRESSINGS) ×4 IMPLANT
PAD ARMBOARD 7.5X6 YLW CONV (MISCELLANEOUS) ×2 IMPLANT
SET BASIN LINEN APH (SET/KITS/TRAYS/PACK) ×2 IMPLANT
SWAB CULTURE LIQ STUART DBL (MISCELLANEOUS) ×4 IMPLANT

## 2017-09-28 NOTE — Op Note (Signed)
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 Constance Haw, MD   Assistants: No qualified resident was available   Anesthesia: General endotracheal   Anesthesiologist: Lerry Liner, MD    Specimens: Culture    Estimated Blood Loss: Minimal   Blood Replacement: None    Complications: None   Wound Class: Dirty/ Infected    Operative Indications: Joe Wilkinson is a 57 yo male with a history of a stroke s/p right craniectomy who was admitted to the hospital with a leukocytosis and findings of a perianal abscess on his CT scan. On my initial visit to see him, the left sided prominent abscess had ruptured spontaneously, and a penrose drain was placed at the bedside through the opening.  There was some concern of the limited CT of the pelvis that he could have other collections, but nothing was identified clinically on exam. Given this, we waited to see if he would improve, but after a day we repeated the CT once he still was spiking fevers.  The CT demonstrated a posterior and right sided collection that had not been drained.  The left side was well drained with the penrose drain in place. After a discussion of the risk and benefits of drainage including bleeding (especially given his eliquis use), need for further operations, continued infection, and drains being in place for a few days to weeks, the patient opted to proceed.  We held his eliquis the day prior to surgery for the night time dose.   Findings: Left sided cavity with some loculations that were broken up anteriorly and posteriorly. The posterior abscess was incised and connected to the left.   There was no clear connection through probing of the posterior to the right.  The right was opened. Drains were left in place (see below).    Procedure: The patient was taken to the operating room. General endotracheal anesthesia was induced and he was placed in lithotomy. No intravenous antibiotics were administered due to his current regimen and need for further cultures.  The perianal and perineal area was prepped and draped in the normal sterile fashion.   The prior spontaneous rupture site was inspected and probed. This went anterior to the base of the scrotum, and a counter incision was made.  The cavity also extended posterior to the anus and a radial incision was made at the area of fluctuance, and the cavity was connected. All loculations were broken up.  On internal rectal exam there was no extension above the sphincter muscles, and the cavity was all latera and superficial to the sphincter muscles.  A culture was obtained.   On the CT there was a second loculation of fluid on the right. Fluctuance could be felt, so an incision was made over this area in the right posterior perianal region.  The cavity was opened up, but did not track to the posterior incision.  There was some tracking more anteriorly, and a separate counter incision was made.  All loculations were again broken up.    A quarter inch penrose drain was placed through the right sided incision and counter incision, and secured with a 3-0 nylon suture at both openings.  The area was  also packed with gauze to aid in hemostasis during the immediate post operative period.    The left sided incisions were also drained with a penrose drain going to the posterior cavity and two penrose drains going side by side to the anterior incision.  These were again all secured in place with a 3-0 nylon suture at both openings. Packing with gauze was also applied to help with hemostasis.  ABDs were placed to the area.    Final inspection revealed  acceptable hemostasis. All counts were correct at the end of the case. The patient was awakened from anesthesia and extubate without complication.  The patient went to the PACU in stable condition.    Curlene Labrum, MD [facility] [address] [place], [place] [address] [phone] (office)

## 2017-09-28 NOTE — Anesthesia Procedure Notes (Signed)
Procedure Name: Intubation Date/Time: 09/28/2017 7:46 AM Performed by: Andree Elk, Thao [place] A, CRNA Pre-anesthesia Checklist: Patient identified, Patient being monitored, Timeout performed, Emergency Drugs available and Suction available Patient Re-evaluated:Patient Re-evaluated prior to induction Oxygen Delivery Method: Circle System Utilized and Circle system utilized Preoxygenation: Pre-oxygenation with 100% oxygen Induction Type: IV induction Ventilation: Mask ventilation without difficulty Laryngoscope Size: Glidescope and 3 Grade View: Grade I Tube type: Oral Tube size: 7.0 mm Number of attempts: 1 Airway Equipment and Method: Stylet Placement Confirmation: ETT inserted through vocal cords under direct vision,  positive ETCO2 and breath sounds checked- equal and bilateral Secured at: 21 cm Tube secured with: Tape Dental Injury: Teeth and Oropharynx as per pre-operative assessment

## 2017-09-28 NOTE — Transfer of Care (Signed)
Immediate Anesthesia Transfer of Care Note  Patient: Joe Wilkinson  Procedure(s) Performed: IRRIGATION AND DEBRIDEMENT PERIANAL ABSCESS (N/A )  Patient Location: PACU  Anesthesia Type:General  Level of Consciousness: awake, alert , oriented and patient cooperative  Airway & Oxygen Therapy: Patient Spontanous Breathing and Patient connected to nasal cannula oxygen  Post-op Assessment: Report given to RN and Post -op Vital signs reviewed and stable  Post vital signs: Reviewed and stable  Last Vitals:  Vitals:   09/28/17 0730 09/28/17 0855  BP: 129/65 (!) 154/80  Pulse:  (!) 107  Resp: (!) 22 16  Temp:  36.9 C  SpO2: 97% 98%    Last Pain:  Vitals:   09/28/17 0701  TempSrc: Oral  PainSc: 0-No pain      Patients Stated Pain Goal: 7 (15/37/94 3276)  Complications: No apparent anesthesia complications

## 2017-09-28 NOTE — Anesthesia Preprocedure Evaluation (Addendum)
Anesthesia Evaluation  Patient identified by MRN, date of birth, ID band Patient awake  General Assessment Comment:Slow responses to questions  Reviewed: Allergy & Precautions, NPO status , Patient's Chart, lab work & pertinent test results  History of Anesthesia Complications Negative for: history of anesthetic complications  Airway Mallampati: III  TM Distance: >3 FB    Comment: Uncooperative with airway exam Dental  (+) Poor Dentition   Pulmonary former smoker,    breath sounds clear to auscultation       Cardiovascular hypertension,  Rhythm:Regular Rate:Tachycardia     Neuro/Psych  Headaches, 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, Residual Symptoms    GI/Hepatic neg GERD  ,  Endo/Other  diabetes, Poorly Controlled, Type 1  Renal/GU      Musculoskeletal   Abdominal   Peds  Hematology  (+) anemia ,   Anesthesia Other Findings   Reproductive/Obstetrics                             Anesthesia Physical Anesthesia Plan  ASA: IV  Anesthesia Plan: General   Post-op Pain Management:    Induction: Intravenous  PONV Risk Score and Plan:   Airway Management Planned: Oral ETT and Video Laryngoscope Planned  Additional Equipment:   Intra-op Plan:   Post-operative Plan: Extubation in OR  Informed Consent: I have reviewed the patients History and Physical, chart, labs and discussed the procedure including the risks, benefits and alternatives for the proposed anesthesia with the patient or authorized representative who has indicated his/her understanding and acceptance.     Plan Discussed with:   Anesthesia Plan Comments:        Anesthesia Quick Evaluation

## 2017-09-28 NOTE — Progress Notes (Signed)
PROGRESS NOTE    Joe Wilkinson  PPI:[id number] DOB: 1961/03/02 DOA: 09/24/2017 PCP: Virgie Dad, MD  Brief Narrative:  This is a 57 year old male with history of prior CVA and left-sided hemiparesis along with PEG tube, diabetes, and hypertension who was brought to the ED due to "feeling bad."  He apparently pulled his PEG tube out which was replaced with a Foley catheter.  He was placed on vancomycin and cefepime for sepsis that appears to be associated to a left gluteal abscess.  IV Flagyl has been added this morning for better anaerobic coverage.  This was evaluated by general surgery earlier today and the patient began to spontaneously drain and had some debridement of loculations with a Penrose drain placed.  He has been recommended to have a speech evaluation performed on account of the fact that he does have some oral intake.  BP stable w/o pressors. continue spiking fever. Will need further I&D.  Assessment & Plan:   Active Problems:   Sepsis ([place])   Abscess, perianal   Dislodged gastrostomy tube ([place])   1. Septic shock likely secondary to left-sided gluteal abscess.  Blood culture thus far has remained negative.  Appreciate general surgery evaluation for I&D.General surgery has performed second I&D and has place drain on right gluteal area. Will follow their rec's. Continue current antibiotics. Follow culture results. Continue supportive care and follow clinical response. 2. Displaced PEG tube.  Continue with Foley catheter at PEG site.  This can be used as needed for feeding.  Will follow speech/swallow therapy recommendations and further  decide whether or not this will be needed in the near future. 3. Hypertension- has remained stable. Continue monitoring off antihypertensive drugs. 4. Diabetes.  Fair control. Will continue current hypoglycemic regimen  5. Prior CVA.  Plan is to resume eliquis for secondary prevention, most likely tomorrow 2/22 (if ok by general surgery).  Continue risk factors modifications and further rehab as per SNF plans at discharge.   DVT prophylaxis:SCDs Code Status: Full Family Communication: None Disposition Plan: TBD, but anticipate discharge back to SNF once medically stable.    Consultants:   General Surgery  Procedures:   L gluteal abscess and right gluteal abscess: I&D 2/18 and 2/21 (respectively)  Antimicrobials:   Vancomycin and Cefepime 2/17->  Flagyl 2/18->   Subjective: Afebrile, no CP, no SOB. tolerated well second I&D to his right gluteal area.   Objective: Vitals:   09/28/17 0930 09/28/17 0945 09/28/17 1006 09/28/17 1425  BP: 137/86 (!) 168/81 118/81 (!) 158/84  Pulse: 60  (!) 102 92  Resp: (!) 22 20 18 18   Temp:   98.8 F (37.1 C) 98.9 F (37.2 C)  TempSrc:    Oral  SpO2: 92% 93% 100%   Weight:      Height:        Intake/Output Summary (Last 24 hours) at 09/28/2017 1511 Last data filed at 09/28/2017 1339 Gross per 24 hour  Intake 5430.58 ml  Output 1135 ml  Net 4295.58 ml   Filed Weights   09/26/17 0500 09/27/17 0503 09/28/17 0442  Weight: 82.3 kg (181 lb 7 oz) 82.1 kg (180 lb 16 oz) 81.5 kg (179 lb 10.8 oz)    Examination: General exam: Currently afebrile, status post second I&D earlier this morning.  Patient tolerated well.  No chest pain, no shortness of breath, no nausea, no vomiting Respiratory system: Good air movement bilaterally, no wheezing, no crackles; positive rhonchi appreciated on exam.  Cardiovascular system: S1 and S2,  no rubs, no gallops, no JVD. Gastrointestinal system: Soft, nontender, nondistended, positive bowel sounds.   Central nervous system: Neurologic exam remains unchanged. He is AAOX3, without new deficit appreciated. Chronic left hemiplegia as residual deficit from previous stroke unchanged.  Extremities: some atrophy appreciated on left side; no cyanosis, no clubbing.  Skin: Patient with open left abscess on his left buttock with drain in place. And now  with I&D on right gluteal with drain in place.  Psychiatry: unchanged psych features; patient with Normal judgment, good insight and appropriate mood. Able to follow commands appropriately.    Data Reviewed: I have personally reviewed following labs and imaging studies  CBC: Recent Labs  Lab 09/24/17 1527 09/25/17 0531 09/26/17 0439 09/27/17 1142  WBC 23.8* 25.0* 19.4* 18.8*  NEUTROABS 19.0*  --   --   --   HGB 11.9* 10.1* 9.4* 10.2*  HCT 38.9* 33.7* 31.2* 33.8*  MCV 90.7 91.3 90.2 89.4  PLT 271 290 311 938   Basic Metabolic Panel: Recent Labs  Lab 09/24/17 1527 09/25/17 0531 09/25/17 0545 09/26/17 0439  NA 140 141  --  136  K 4.2 3.4*  --  3.5  CL 103 112*  --  110  CO2 26 19*  --  19*  GLUCOSE 141* 146*  --  89  BUN 24* 13  --  7  CREATININE 0.90 0.69  --  0.55*  CALCIUM 9.2 8.0*  --  7.7*  MG  --   --  2.1  --   PHOS 2.2*  --   --   --    GFR: Estimated Creatinine Clearance: 103.4 mL/min (A) (by C-G formula based on SCr of 0.55 mg/dL (L)).   Liver Function Tests: Recent Labs  Lab 09/24/17 1527  AST 34  ALT 59  ALKPHOS 102  BILITOT 1.0  PROT 8.3*  ALBUMIN 2.6*   Recent Labs  Lab 09/24/17 1527  LIPASE 35   Coagulation Profile: Recent Labs  Lab 09/24/17 1527  INR 1.31   CBG: Recent Labs  Lab 09/28/17 0030 09/28/17 0439 09/28/17 0704 09/28/17 0902 09/28/17 1113  GLUCAP 90 91 99 102* 115*   Sepsis Labs: Recent Labs  Lab 09/24/17 1543 09/26/17 0439  LATICACIDVEN 1.55 0.5    Recent Results (from the past 240 hour(s))  Culture, blood (Routine x 2)     Status: None (Preliminary result)   Collection Time: 09/24/17  3:29 PM  Result Value Ref Range Status   Specimen Description   Final    BLOOD LEFT ANTECUBITAL Blood Culture adequate volume BOTTLES DRAWN AEROBIC ONLY   Special Requests NONE  Final   Culture   Final    NO GROWTH 4 DAYS Performed at [facility], [address], [place], [place] [address]    Report Status PENDING   Incomplete  MRSA PCR Screening     Status: None   Collection Time: 09/24/17 11:30 PM  Result Value Ref Range Status   MRSA by PCR NEGATIVE NEGATIVE Final    Comment:        The GeneXpert MRSA Assay (FDA approved for NASAL specimens only), is one component of a comprehensive MRSA colonization surveillance program. It is not intended to diagnose MRSA infection nor to guide or monitor treatment for MRSA infections. Performed at [facility], [address] [place], [place] [address]   Aerobic Culture (superficial specimen)     Status: None   Collection Time: 09/25/17  1:55 PM  Result Value Ref Range Status  Specimen Description   Final    ABSCESS Performed at [facility] [address] [place], [place] [address]    Special Requests   Final    Normal Performed at [facility], [address], [place], [place] [address]    Gram Stain   Final    MODERATE WBC PRESENT, PREDOMINANTLY PMN FEW GRAM POSITIVE COCCI IN PAIRS IN CLUSTERS RARE GRAM NEGATIVE RODS    Culture   Final    FEW NORMAL SKIN FLORA Performed at [facility], [place] [address], [place], [place] [address]    Report Status 09/28/2017 FINAL  Final     Radiology Studies: Ct Pelvis W Contrast  Result Date: 09/27/2017 CLINICAL DATA:  Left buttock pain for 3 days. Evaluate for left gluteal abscess. EXAM: CT PELVIS WITH CONTRAST TECHNIQUE: Multidetector CT imaging of the pelvis was performed using the standard protocol following the bolus administration of intravenous contrast. CONTRAST:  193mL ISOVUE-300 IOPAMIDOL (ISOVUE-300) INJECTION 61% COMPARISON:  09/24/2017 FINDINGS: Since the study of 3 days ago, there has been placement of a drainage catheter in the left buttock abscess. The left-sided collection is largely evacuated. There is some regional edematous change. Small fluid collection to the right-sided midline in the right fatty buttock persists, measuring about 2.8 cm in diameter. Small left posterior  perirectal abscess persists, 2 cm in diameter. No new or progressive finding. No evidence of deep space infection. In the pelvis itself, there is no finding of note. IMPRESSION: Catheter drainage of a left buttock abscess. Some persistent collection in the right buttock fatty tissues, measuring 2.8 cm in maximal diameter. Small persistent left posterior perirectal/perianal collection unchanged at 2 cm in diameter. Electronically Signed   By: Nelson Chimes M.D.   On: 09/27/2017 15:09   Scheduled Meds: . chlorhexidine  15 mL Mouth Rinse BID  . Chlorhexidine Gluconate Cloth  6 each Topical Daily  . feeding supplement (ENSURE ENLIVE)  237 mL Oral BID BM  . feeding supplement (GLUCERNA 1.2 CAL)  300 mL Per Tube QID  . feeding supplement (PRO-STAT SUGAR FREE 64)  30 mL Oral BID  . insulin aspart  0-15 Units Subcutaneous Q4H  . insulin detemir  30 Units Subcutaneous Daily  . nutrition supplement (JUVEN)  1 packet Oral BID BM  . pantoprazole (PROTONIX) IV  40 mg Intravenous Q24H  . polyethylene glycol  17 g Per Tube Daily  . sodium chloride flush  10-40 mL Intracatheter Q12H  . sodium chloride flush  3 mL Intravenous Q12H   Continuous Infusions: . sodium chloride 125 mL/hr at 09/28/17 0240  . ceFEPime (MAXIPIME) IV Stopped (09/28/17 1339)  . metronidazole Stopped (09/28/17 0333)  . norepinephrine (LEVOPHED) Adult infusion 1 mcg/min (09/25/17 2115)  . vancomycin Stopped (09/28/17 1021)     LOS: 4 days    Time spent: 25 minutes    Barton Dubois, MD [facility] Pager [phone]  If 7PM-7AM, please contact night-coverage [email or web address] Password [facility] 09/28/2017, 3:11 PM

## 2017-09-28 NOTE — Progress Notes (Signed)
Patient off unit via bed by OR staff. No distress was noted before transfer.

## 2017-09-28 NOTE — Interval H&P Note (Signed)
History and Physical Interval Note:  09/28/2017 7:02 AM  Joe Wilkinson  has presented today for surgery, with the diagnosis of Perirectal Abscess  The various methods of treatment have been discussed with the patient and family. After consideration of risks, benefits and other options for treatment, the patient has consented to  Procedure(s): IRRIGATION AND DEBRIDEMENT PERIRECTAL ABSCESS (N/A) as a surgical intervention .  The patient's history has been reviewed, patient examined, no change in status, stable for surgery.  I have reviewed the patient's chart and labs.  Questions were answered to the patient's satisfaction.    Attempt at another bedside drainage not feasible. CT with continued areas of collection. Will go to OR and definitely drain the remaining areas and secure drains in place. Previous one has fallen out.  No questions from the patient. Virl Cagey

## 2017-09-28 NOTE — Progress Notes (Signed)
Received verbal order from Dr. Constance Haw for Morphine 2 mg Q3hours PRN for severe pain and Roxicet solution 5 mg Q4hours PRN as needed for moderate pain.

## 2017-09-28 NOTE — Discharge Instructions (Signed)
Keep the area clean and take sitz baths every morning and night and as needed if you are soiled. Place pads in the anal area for the drainage.  Leave the drains in place.  Disposable Sitz Bath A disposable sitz bath is a plastic basin that fits over the toilet. A bag is hung above the toilet, and the bag is connected to a tube that opens into the basin. The bag is filled with warm water that flows into the basin through the tube. A sitz bath can be used to help relieve symptoms, clean, and promote healing in the genital and anal areas, as well as in the lower abdomen and buttocks. What are the risks? Sitz baths are generally very safe. It is possible for the skin between the genitals and the anus (perineum) to become infected, but this is rare. You can avoid this by cleaning your sitz bath supplies thoroughly. How to use a disposable sitz bath 1. Close the clamp on the tube. Make sure the clamp is closed tightly to prevent leakage. 2. Fill the sitz bath basin and the plastic bag with warm water. The water should be warm enough to be comfortable, but not hot. 3. Raise the toilet seat and place the filled basin on the toilet. Make sure the overflow opening is facing toward the back of the toilet. ? If you prefer, you may place the empty basin on the toilet first, and then use the plastic bag to fill the basin with warm water. 4. Hang the filled plastic bag overhead on a hook or towel rack close to the toilet. The bag should be higher than the toilet so that the water will flow down through the tube. 5. Attach the tube to the opening on the basin. Make sure that the tube is attached to the basin tightly to prevent leakage. 6. Sit on the basin and release the clamp. This will allow warm water to flow into the basin and flush the area around your genitals and anus. 7. Remain sitting on the basin for about 15-20 minutes, or as long as told by your health care provider. 8. Stand up and gently pat your skin  dry. If directed, apply clean bandages (dressings) to the affected area as told by your health care provider. 9. Carefully remove the basin from the toilet seat and tip the basin into the toilet to empty any remaining water. Empty any remaining water from the plastic bag into the toilet. Then, flush the toilet. 10. Wash the basin with warm water and soap. Let the basin air dry in the sink. You should also let the plastic bag and the tubing air dry. 11. Store the basin, tubing, and plastic bag in a clean, dry area. 12. Wash your hands with soap and water. If soap and water are not available, use hand sanitizer. Contact a health care provider if:  You have symptoms that get worse instead of better.  You develop new skin irritation, redness, or swelling around your genitals or anus. This information is not intended to replace advice given to you by your health care provider. Make sure you discuss any questions you have with your health care provider. Document Released: 01/24/2012 Document Revised: 12/31/2015 Document Reviewed: 06/14/2015 Elsevier Interactive Patient Education  2018 [employer]. How to Take a [employer] A sitz bath is a warm water bath that is taken while you are sitting down. The water should only come up to your hips and should cover your buttocks.  Your health care provider may recommend a sitz bath to help you:  Clean the lower part of your body, including your genital area.  With itching.  With pain.  With sore muscles or muscles that tighten or spasm.  How to take a sitz bath Take 3-4 sitz baths per day or as told by your health care provider. 1. Partially fill a bathtub with warm water. You will only need the water to be deep enough to cover your hips and buttocks when you are sitting in it. 2. If your health care provider told you to put medicine in the water, follow the directions exactly. 3. Sit in the water and open the tub drain a little. 4. Turn on the warm  water again to keep the tub at the correct level. Keep the water running constantly. 5. Soak in the water for 15-20 minutes or as told by your health care provider. 6. After the sitz bath, pat the affected area dry first. Do not rub it. 7. Be careful when you stand up after the sitz bath because you may feel dizzy.  Contact a health care provider if:  Your symptoms get worse. Do not continue with sitz baths if your symptoms get worse.  You have new symptoms. Do not continue with sitz baths until you talk with your health care provider. This information is not intended to replace advice given to you by your health care provider. Make sure you discuss any questions you have with your health care provider. Document Released: 04/16/2004 Document Revised: 12/23/2015 Document Reviewed: 07/23/2014 Elsevier Interactive Patient Education  [employer].

## 2017-09-28 NOTE — Anesthesia Postprocedure Evaluation (Signed)
Anesthesia Post Note  Patient: Joe Wilkinson  Procedure(s) Performed: IRRIGATION AND DEBRIDEMENT PERIANAL ABSCESS (N/A )  Patient location during evaluation: PACU Anesthesia Type: General Level of consciousness: awake and alert, oriented and patient cooperative Pain management: pain level controlled Vital Signs Assessment: post-procedure vital signs reviewed and stable Respiratory status: spontaneous breathing Cardiovascular status: stable Postop Assessment: no apparent nausea or vomiting Anesthetic complications: no     Last Vitals:  Vitals:   09/28/17 0915 09/28/17 0930  BP: (!) 147/133 137/86  Pulse: (!) 128 60  Resp: 20 (!) 22  Temp:    SpO2: 97% 92%    Last Pain:  Vitals:   09/28/17 0930  TempSrc:   PainSc: 4                  ADAMS, AMY A

## 2017-09-28 NOTE — Clinical Social Work Note (Signed)
CSW following. Pt had surgical procedure today for wounds. He may dc with drains in place. Plan is for return to [facility] when stable. LCSW, Ambrose Pancoast, will be following for any needs on Friday 2/22.

## 2017-09-29 LAB — GLUCOSE, CAPILLARY
GLUCOSE-CAPILLARY: 112 mg/dL — AB (ref 65–99)
GLUCOSE-CAPILLARY: 133 mg/dL — AB (ref 65–99)
GLUCOSE-CAPILLARY: 97 mg/dL (ref 65–99)
Glucose-Capillary: 101 mg/dL — ABNORMAL HIGH (ref 65–99)
Glucose-Capillary: 155 mg/dL — ABNORMAL HIGH (ref 65–99)
Glucose-Capillary: 79 mg/dL (ref 65–99)
Glucose-Capillary: 122 mg/dL — ABNORMAL HIGH (ref 65–99)

## 2017-09-29 LAB — CULTURE, BLOOD (ROUTINE X 2): Culture: NO GROWTH

## 2017-09-29 MED ORDER — SULFAMETHOXAZOLE-TRIMETHOPRIM 800-160 MG PO TABS
1.0000 | ORAL_TABLET | Freq: Two times a day (BID) | ORAL | Status: DC
  Administered 2017-09-29 – 2017-09-30 (×2): 1 via ORAL
  Filled 2017-09-29 (×2): qty 1

## 2017-09-29 MED ORDER — APIXABAN 5 MG PO TABS
5.0000 mg | ORAL_TABLET | Freq: Two times a day (BID) | ORAL | Status: DC
  Administered 2017-09-29 – 2017-09-30 (×2): 5 mg via ORAL
  Filled 2017-09-29 (×2): qty 1

## 2017-09-29 NOTE — Progress Notes (Signed)
Pt refused vitals being taken during night shift. He complained that he was in too much pain to be messed with. Pt is alert and oriented. He did take most of his medication for me.

## 2017-09-29 NOTE — Progress Notes (Signed)
[place] for Vancomycin Indication: sepsis, gluteal abscess  Plan:  Continue vancomycin to 1250 mg q12 hours Continue Flagyl 500mg  IV q8hrs per MD (f/u for switch to PO) Continue Cefepime 1gm IV q8h Deescalate ABX when improved / appropriate F/u renal function, cultures and clinical course  Cefepime 2/18 >> Vancomycin 2/17 >> Flagyl 2/18 >>  Allergies  Allergen Reactions  . Penicillins Hives   Patient Measurements: Height: 5\' 6"  (167.6 cm) Weight: 178 lb 2 oz (80.8 kg) IBW/kg (Calculated) : 63.8 kg  Vital Signs:    Labs: Recent Labs    09/27/17 1142  WBC 18.8*  HGB 10.2*  PLT 366   Estimated Creatinine Clearance: 103 mL/min (A) (by C-G formula based on SCr of 0.55 mg/dL (L)).  Microbiology: Recent Results (from the past 720 hour(s))  Culture, blood (Routine x 2)     Status: None   Collection Time: 09/24/17  3:29 PM  Result Value Ref Range Status   Specimen Description   Final    BLOOD LEFT ANTECUBITAL Blood Culture adequate volume BOTTLES DRAWN AEROBIC ONLY   Special Requests NONE  Final   Culture   Final    NO GROWTH 5 DAYS Performed at [facility], [address] [place], [place] [address]    Report Status 09/29/2017 FINAL  Final  MRSA PCR Screening     Status: None   Collection Time: 09/24/17 11:30 PM  Result Value Ref Range Status   MRSA by PCR NEGATIVE NEGATIVE Final    Comment:        The GeneXpert MRSA Assay (FDA approved for NASAL specimens only), is one component of a comprehensive MRSA colonization surveillance program. It is not intended to diagnose MRSA infection nor to guide or monitor treatment for MRSA infections. Performed at [facility], [address] [place], [place] [address]   Aerobic Culture (superficial specimen)     Status: None   Collection Time: 09/25/17  1:55 PM  Result Value Ref Range Status   Specimen Description   Final    ABSCESS Performed at [facility], [address]  [place], [place] [address]    Special Requests   Final    Normal Performed at [facility], [address], [place], [place] [address]    Gram Stain   Final    MODERATE WBC PRESENT, PREDOMINANTLY PMN FEW GRAM POSITIVE COCCI IN PAIRS IN CLUSTERS RARE GRAM NEGATIVE RODS    Culture   Final    FEW NORMAL SKIN FLORA Performed at [facility], [place] [address], [place], [place] [address]    Report Status 09/28/2017 FINAL  Final  Aerobic Culture (superficial specimen)     Status: None (Preliminary result)   Collection Time: 09/28/17  8:20 AM  Result Value Ref Range Status   Specimen Description   Final    ABSCESS Performed at [facility], [address] [place], [place] [address]    Special Requests   Final    NONE Performed at [facility], [address], [place], [place] [address]    Gram Stain NO WBC SEEN RARE GRAM VARIABLE ROD   Final   Culture   Final    CULTURE REINCUBATED FOR BETTER GROWTH Performed at [facility], [place] [address], [place], [place] [address]    Report Status PENDING  Incomplete   Medical History: Past Medical History:  Diagnosis Date  . DM (diabetes mellitus) ([place])   . Hypertension   .  Stroke [facility])    Medications:  Anti-infectives (From admission, onward)   Start     Dose/Rate Route Frequency Ordered Stop   09/27/17 1700  vancomycin (VANCOCIN) 1,250 mg in sodium chloride 0.9 % 250 mL IVPB     1,250 mg 166.7 mL/hr over 90 Minutes Intravenous Every 12 hours 09/27/17 1652     09/25/17 2200  ceFEPIme (MAXIPIME) 500 mg in dextrose 5 % 50 mL IVPB  Status:  Discontinued     500 mg 100 mL/hr over 30 Minutes Intravenous Every 8 hours 09/24/17 1920 09/25/17 0134   09/25/17 1000  metroNIDAZOLE (FLAGYL) IVPB 500 mg     500 mg 100 mL/hr over 60 Minutes Intravenous Every 8 hours 09/25/17 0912     09/25/17 0500  vancomycin (VANCOCIN) IVPB 1000 mg/200 mL premix  Status:  Discontinued     1,000 mg 200 mL/hr over 60 Minutes Intravenous Every 12 hours  09/25/17 0204 09/27/17 1652   09/25/17 0230  ceFEPIme (MAXIPIME) 1 g in sodium chloride 0.9 % 100 mL IVPB     1 g 200 mL/hr over 30 Minutes Intravenous Every 8 hours 09/25/17 0159     09/25/17 0145  ceFEPIme (MAXIPIME) 500 mg in dextrose 5 % 50 mL IVPB  Status:  Discontinued     500 mg 100 mL/hr over 30 Minutes Intravenous Every 8 hours 09/25/17 0134 09/25/17 0159   09/24/17 1530  vancomycin (VANCOCIN) IVPB 1000 mg/200 mL premix     1,000 mg 200 mL/hr over 60 Minutes Intravenous  Once 09/24/17 1515 09/24/17 1730   09/24/17 1530  piperacillin-tazobactam (ZOSYN) IVPB 3.375 g     3.375 g 100 mL/hr over 30 Minutes Intravenous  Once 09/24/17 1515 09/24/17 1629     Assessment: 57 yr old male admitted for r/o sepsis, gluteal abscess.  His vanc trough today was 11.0 mcg/ml  Goal of Therapy:  Vancomycin trough level 15-20 mcg/ml  Hart Robinsons A, RPH 09/29/2017,12:39 PM

## 2017-09-29 NOTE — Progress Notes (Signed)
PROGRESS NOTE    Joe Wilkinson  LGX:[id number] DOB: [birth year] DOA: 09/24/2017 PCP: Virgie Dad, MD  Brief Narrative:  This is a 57 year old male with history of prior CVA and left-sided hemiparesis along with PEG tube, diabetes, and hypertension who was brought to the ED due to "feeling bad."  He apparently pulled his PEG tube out which was replaced with a Foley catheter.  He was placed on vancomycin and cefepime for sepsis that appears to be associated to a left gluteal abscess.  IV Flagyl has been added this morning for better anaerobic coverage.  This was evaluated by general surgery earlier today and the patient began to spontaneously drain and had some debridement of loculations with a Penrose drain placed.  He has been recommended to have a speech evaluation performed on account of the fact that he does have some oral intake.  BP stable w/o pressors. continue spiking fever. Will need further I&D.  Assessment & Plan:   Active Problems:   Sepsis ([place])   Abscess, perianal   Dislodged gastrostomy tube ([place])   1. Septic shock likely secondary to left-sided gluteal abscess.  Blood culture thus far has remained negative.  Appreciate general surgery evaluation for I&D. General surgery has performed second I&D and has place drain on right gluteal area.  We will transition antibiotics to p.o. and assess tolerance.  Plan is to keep Penrose drains in place and the general surgery team will follow patient at the pain center to was slowly removed then.  Continue wound care as per their instructions. 2. Displaced PEG tube.  Replace and stable.  Patient so far tolerating swallowing well.  Continue with PEG as a backup route. 3. Hypertension- blood pressure has remained stable without the use of antihypertensive agents.  Will continue monitoring and holding medications to avoid hypotension. 4. Diabetes.  Overall with fair control.  Will continue current hypoglycemic regimen. 5. Prior CVA.  Will resume  Eliquis for secondary prevention. Continue risk factors modifications and further rehab as per SNF plans at discharge.   DVT prophylaxis:SCDs Code Status: Full Family Communication: None Disposition Plan: Anticipate discharge back to pain center on 2/23 if he remains afebrile and otherwise tolerated well transition to oral antibiotics.  Consultants:   General Surgery  Procedures:   L gluteal abscess and right gluteal abscess: I&D 2/18 and 2/21 (respectively)  Antimicrobials:   Vancomycin and Cefepime 2/17->2/22  Flagyl 2/18->2/22  Bactrim 2/22   Subjective: No fever, no chest pain, no shortness of breath, no nausea vomiting.  Patient reports mild discomfort in his gluteal area after I&D otherwise in no acute distress currently.  Objective: Vitals:   09/28/17 0945 09/28/17 1006 09/28/17 1425 09/29/17 0500  BP: (!) 168/81 118/81 (!) 158/84   Pulse:  (!) 102 92   Resp: 20 18 18    Temp:  98.8 F (37.1 C) 98.9 F (37.2 C)   TempSrc:   Oral   SpO2: 93% 100%    Weight:    80.8 kg (178 lb 2 oz)  Height:        Intake/Output Summary (Last 24 hours) at 09/29/2017 1755 Last data filed at 09/29/2017 1146 Gross per 24 hour  Intake 1537.5 ml  Output 3100 ml  Net -1562.5 ml   Filed Weights   09/27/17 0503 09/28/17 0442 09/29/17 0500  Weight: 82.1 kg (180 lb 16 oz) 81.5 kg (179 lb 10.8 oz) 80.8 kg (178 lb 2 oz)    Examination: General exam: Patient afebrile for almost  24 hours now, reports some mild discomfort around his gluteal area otherwise in no acute distress.  No chest pain, no shortness of breath.  Respiratory system: Good air movement bilaterally, no wheezing, no crackles; positive rhonchi appreciated. Cardiovascular system: No JVD, no rubs, no gallops; positive S1 and S2 on examination. Gastrointestinal system: Soft, nontender, positive bowel sounds, PEG tube in place. The rest of his physical examination has essentially remained unchanged from my exam on 2/24;  please see below for details: Central nervous system: Neurologic exam remains unchanged. He is AAOX3, without new deficit appreciated. Chronic left hemiplegia as residual deficit from previous stroke unchanged.  Extremities: some atrophy appreciated on left side; no cyanosis, no clubbing.  Skin: Patient with open left abscess on his left buttock with drain in place. And now with I&D on right gluteal with drain in place.  Psychiatry: unchanged psych features; patient with Normal judgment, good insight and appropriate mood. Able to follow commands appropriately.    Data Reviewed: I have personally reviewed following labs and imaging studies  CBC: Recent Labs  Lab 09/24/17 1527 09/25/17 0531 09/26/17 0439 09/27/17 1142  WBC 23.8* 25.0* 19.4* 18.8*  NEUTROABS 19.0*  --   --   --   HGB 11.9* 10.1* 9.4* 10.2*  HCT 38.9* 33.7* 31.2* 33.8*  MCV 90.7 91.3 90.2 89.4  PLT 271 290 311 536   Basic Metabolic Panel: Recent Labs  Lab 09/24/17 1527 09/25/17 0531 09/25/17 0545 09/26/17 0439  NA 140 141  --  136  K 4.2 3.4*  --  3.5  CL 103 112*  --  110  CO2 26 19*  --  19*  GLUCOSE 141* 146*  --  89  BUN 24* 13  --  7  CREATININE 0.90 0.69  --  0.55*  CALCIUM 9.2 8.0*  --  7.7*  MG  --   --  2.1  --   PHOS 2.2*  --   --   --    GFR: Estimated Creatinine Clearance: 103 mL/min (A) (by C-G formula based on SCr of 0.55 mg/dL (L)).   Liver Function Tests: Recent Labs  Lab 09/24/17 1527  AST 34  ALT 59  ALKPHOS 102  BILITOT 1.0  PROT 8.3*  ALBUMIN 2.6*   Recent Labs  Lab 09/24/17 1527  LIPASE 35   Coagulation Profile: Recent Labs  Lab 09/24/17 1527  INR 1.31   CBG: Recent Labs  Lab 09/29/17 0107 09/29/17 0426 09/29/17 0825 09/29/17 1151 09/29/17 1749  GLUCAP 133* 97 101* 155* 79   Sepsis Labs: Recent Labs  Lab 09/24/17 1543 09/26/17 0439  LATICACIDVEN 1.55 0.5    Recent Results (from the past 240 hour(s))  Culture, blood (Routine x 2)     Status: None    Collection Time: 09/24/17  3:29 PM  Result Value Ref Range Status   Specimen Description   Final    BLOOD LEFT ANTECUBITAL Blood Culture adequate volume BOTTLES DRAWN AEROBIC ONLY   Special Requests NONE  Final   Culture   Final    NO GROWTH 5 DAYS Performed at [facility], [address], [place], [place] [address]    Report Status 09/29/2017 FINAL  Final  MRSA PCR Screening     Status: None   Collection Time: 09/24/17 11:30 PM  Result Value Ref Range Status   MRSA by PCR NEGATIVE NEGATIVE Final    Comment:        The GeneXpert MRSA Assay (FDA approved for NASAL  specimens only), is one component of a comprehensive MRSA colonization surveillance program. It is not intended to diagnose MRSA infection nor to guide or monitor treatment for MRSA infections. Performed at [facility], [address], [place], [place] [address]   Aerobic Culture (superficial specimen)     Status: None   Collection Time: 09/25/17  1:55 PM  Result Value Ref Range Status   Specimen Description   Final    ABSCESS Performed at [facility], [address] [place], [place] [address]    Special Requests   Final    Normal Performed at [facility], [address], [place], [place] [address]    Gram Stain   Final    MODERATE WBC PRESENT, PREDOMINANTLY PMN FEW GRAM POSITIVE COCCI IN PAIRS IN CLUSTERS RARE GRAM NEGATIVE RODS    Culture   Final    FEW NORMAL SKIN FLORA Performed at [facility], [place] [address], [place], [place] [address]    Report Status 09/28/2017 FINAL  Final  Aerobic Culture (superficial specimen)     Status: None (Preliminary result)   Collection Time: 09/28/17  8:20 AM  Result Value Ref Range Status   Specimen Description   Final    ABSCESS Performed at [facility], [address] [place], [place] [address]    Special Requests   Final    NONE Performed at [facility], [address], [place], [place] [address]    Gram Stain NO WBC SEEN RARE GRAM VARIABLE ROD    Final   Culture   Final    CULTURE REINCUBATED FOR BETTER GROWTH Performed at [facility], [place] [address] [place], [place] [address]    Report Status PENDING  Incomplete     Radiology Studies: No results found. Scheduled Meds: . chlorhexidine  15 mL Mouth Rinse BID  . Chlorhexidine Gluconate Cloth  6 each Topical Daily  . feeding supplement (ENSURE ENLIVE)  237 mL Oral BID BM  . feeding supplement (GLUCERNA 1.2 CAL)  300 mL Per Tube QID  . feeding supplement (PRO-STAT SUGAR FREE 64)  30 mL Oral BID  . insulin aspart  0-15 Units Subcutaneous Q4H  . insulin detemir  30 Units Subcutaneous Daily  . nutrition supplement (JUVEN)  1 packet Oral BID BM  . pantoprazole (PROTONIX) IV  40 mg Intravenous Q24H  . polyethylene glycol  17 g Per Tube Daily  . sodium chloride flush  10-40 mL Intracatheter Q12H  . sodium chloride flush  3 mL Intravenous Q12H  . sulfamethoxazole-trimethoprim  1 tablet Oral Q12H   Continuous Infusions: . sodium chloride 125 mL/hr at 09/29/17 1553     LOS: 5 days    Time spent: 25 minutes    Barton Dubois, MD [facility] Pager [phone]  If 7PM-7AM, please contact night-coverage [email or web address] Password [facility] 09/29/2017, 5:55 PM

## 2017-09-29 NOTE — Addendum Note (Signed)
Addendum  created 09/29/17 1257 by Charmaine Downs, CRNA   Sign clinical note

## 2017-09-29 NOTE — Progress Notes (Signed)
[facility] Progress Note  1 Day Post-Op  Subjective: No major issues. No complaints. Has stooled again. Aided RN in cleaning the patient.   Objective: Vital signs in last 24 hours: Weight:  [178 lb 2 oz (80.8 kg)] 178 lb 2 oz (80.8 kg) (02/22 0500) Last BM Date: 09/28/17  Intake/Output from previous day: 02/21 0701 - 02/22 0700 In: 4752.9 [P.O.:480; I.V.:3272.9; IV Piggyback:1000] Out: 1310 [Urine:1300; Blood:10] Intake/Output this shift: Total I/O In: -  Out: 2100 [Urine:2100]  General appearance: alert, cooperative and no distress drains in place in the perianal area, stool washed away, gauze removed without issues, no gross bleeding  Lab Results:  Recent Labs    09/27/17 1142  WBC 18.8*  HGB 10.2*  HCT 33.8*  PLT 366    Anti-infectives: Anti-infectives (From admission, onward)   Start     Dose/Rate Route Frequency Ordered Stop   09/27/17 1700  vancomycin (VANCOCIN) 1,250 mg in sodium chloride 0.9 % 250 mL IVPB     1,250 mg 166.7 mL/hr over 90 Minutes Intravenous Every 12 hours 09/27/17 1652     09/25/17 2200  ceFEPIme (MAXIPIME) 500 mg in dextrose 5 % 50 mL IVPB  Status:  Discontinued     500 mg 100 mL/hr over 30 Minutes Intravenous Every 8 hours 09/24/17 1920 09/25/17 0134   09/25/17 1000  metroNIDAZOLE (FLAGYL) IVPB 500 mg     500 mg 100 mL/hr over 60 Minutes Intravenous Every 8 hours 09/25/17 0912     09/25/17 0500  vancomycin (VANCOCIN) IVPB 1000 mg/200 mL premix  Status:  Discontinued     1,000 mg 200 mL/hr over 60 Minutes Intravenous Every 12 hours 09/25/17 0204 09/27/17 1652   09/25/17 0230  ceFEPIme (MAXIPIME) 1 g in sodium chloride 0.9 % 100 mL IVPB     1 g 200 mL/hr over 30 Minutes Intravenous Every 8 hours 09/25/17 0159     09/25/17 0145  ceFEPIme (MAXIPIME) 500 mg in dextrose 5 % 50 mL IVPB  Status:  Discontinued     500 mg 100 mL/hr over 30 Minutes Intravenous Every 8 hours 09/25/17 0134 09/25/17 0159   09/24/17 1530   vancomycin (VANCOCIN) IVPB 1000 mg/200 mL premix     1,000 mg 200 mL/hr over 60 Minutes Intravenous  Once 09/24/17 1515 09/24/17 1730   09/24/17 1530  piperacillin-tazobactam (ZOSYN) IVPB 3.375 g     3.375 g 100 mL/hr over 30 Minutes Intravenous  Once 09/24/17 1515 09/24/17 1629      Assessment/Plan: Mr. 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  -Packing removed -Continue antibiotics  -Can d/c back to SNF and I will follow as outpatient to slowly remove the drains   Updated Dr. Dyann Kief.    LOS: 5 days    Virl Cagey 09/29/2017

## 2017-09-29 NOTE — Anesthesia Postprocedure Evaluation (Signed)
Anesthesia Post Note  Patient: Joe Wilkinson  Procedure(s) Performed: IRRIGATION AND DEBRIDEMENT PERIANAL ABSCESS (N/A )  Patient location during evaluation: Nursing Unit Anesthesia Type: General Level of consciousness: awake Pain management: pain level controlled Vital Signs Assessment: post-procedure vital signs reviewed and stable Respiratory status: spontaneous breathing, nonlabored ventilation and respiratory function stable Postop Assessment: no apparent nausea or vomiting Anesthetic complications: no     Last Vitals:  Vitals:   09/28/17 1006 09/28/17 1425  BP: 118/81 (!) 158/84  Pulse: (!) 102 92  Resp: 18 18  Temp: 37.1 C 37.2 C  SpO2: 100%     Last Pain:  Vitals:   09/29/17 1004  TempSrc:   PainSc: 0-No pain                 Mackinzee Roszak J

## 2017-09-29 NOTE — Progress Notes (Signed)
ANTICOAGULATION CONSULT NOTE - Initial Consult  Pharmacy Consult for APIXABAN (home med) Indication: atrial fibrillation  Allergies  Allergen Reactions  . Penicillins Hives    Patient Measurements: Height: 5\' 6"  (167.6 cm) Weight: 196 lb 6.9 oz (89.1 kg) IBW/kg (Calculated) : 63.8  Vital Signs: Temp: 101 F (38.3 C) (02/22 1700) Temp Source: Oral (02/22 1700) BP: 120/67 (02/22 1700) Pulse Rate: 115 (02/22 1700)  Labs: Recent Labs    09/27/17 1142  HGB 10.2*  HCT 33.8*  PLT 366    Estimated Creatinine Clearance: 107.8 mL/min (A) (by C-G formula based on SCr of 0.55 mg/dL (L)).   Medical History: Past Medical History:  Diagnosis Date  . DM (diabetes mellitus) ([place])   . Hypertension   . Stroke [facility])     Medications:  Medications Prior to Admission  Medication Sig Dispense Refill Last Dose  . Amino Acids-Protein Hydrolys (FEEDING SUPPLEMENT, PRO-STAT SUGAR FREE 64,) LIQD Place 30 mLs into feeding tube daily. Per gastric tube    09/24/2017 at Unknown time  . apixaban (ELIQUIS) 5 MG TABS tablet Place 1 tablet (5 mg total) into feeding tube 2 (two) times daily. 60 tablet  09/24/2017 at [place]  . bisacodyl (DULCOLAX) 10 MG suppository Place 1 suppository (10 mg total) rectally daily as needed for moderate constipation. 12 suppository 0 unknown  . chlorhexidine (PERIDEX) 0.12 % solution 15 mLs by Mouth Rinse route 2 (two) times daily. 120 mL 0 09/24/2017 at Unknown time  . Cholecalciferol (VITAMIN D3) 5000 units CAPS Take 1 capsule (5,000 Units total) by mouth daily. (Patient taking differently: Place 5,000 Units into feeding tube daily. ) 90 capsule 0 09/24/2017 at Unknown time  . docusate (COLACE) 50 MG/5ML liquid Place 10 mLs (100 mg total) into feeding tube 2 (two) times daily as needed for mild constipation. 100 mL 0 09/24/2017 at Unknown time  . feeding supplement, ENSURE ENLIVE, (ENSURE ENLIVE) LIQD Take 237 mLs by mouth 2 (two) times daily between meals. 237 mL 12 09/24/2017  at Unknown time  . gabapentin (NEURONTIN) 300 MG capsule Take 1 capsule (300 mg total) by mouth at bedtime.   09/23/2017 at Unknown time  . insulin detemir (LEVEMIR) 100 UNIT/ML injection Inject 0.34 mLs (34 Units total) into the skin daily. 10 mL 11 09/24/2017 at Unknown time  . metoprolol tartrate (LOPRESSOR) 25 MG tablet Place 0.5 tablets (12.5 mg total) into feeding tube 2 (two) times daily.   09/24/2017 at 900a  . mirtazapine (REMERON) 15 MG tablet Take 7.5 mg by mouth at bedtime.   09/23/2017 at Unknown time  . Nutritional Supplements (FEEDING SUPPLEMENT, GLUCERNA 1.2 CAL,) LIQD Glucerna 1.2 cal by peg tube Three times a day flush with 30 ml free water before and after administering full volume of TF.   09/24/2017 at Unknown time  . pantoprazole sodium (PROTONIX) 40 mg/20 mL PACK Place 20 mLs (40 mg total) into feeding tube daily. 30 each  09/24/2017 at Unknown time  . polyethylene glycol (MIRALAX / GLYCOLAX) packet Place 17 g into feeding tube daily.   09/24/2017 at Unknown time  . Water For Irrigation, Sterile (FREE WATER) SOLN Place 300 mLs into feeding tube every 4 (four) hours. Keep HOB elevated at 45 degrees at all times during feedings, flushings, and medications 12a, 4a, 8a, 12p, 4p,8p   unknown  . witch hazel-glycerin (TUCKS) pad Apply 1 application topically as needed for itching.   unknown  . zoledronic acid (RECLAST) 5 MG/100ML SOLN injection Inject 5 mg into  the vein once.   09/13/2017 at Unknown time  . acetaminophen (TYLENOL) 325 MG tablet Place 650 mg into feeding tube every 4 (four) hours as needed.   unknown    Assessment: 57yo male on Apixaban 5mg  BID prior to admission.  Was held due to surgery.  Pharmacy now asked to resume.   Goal of Therapy:  Monitor platelets by anticoagulation protocol: Yes   Plan:  Apixaban 5mg  po BID Monitor for s/sx bleeding complications  Ena Dawley 09/29/2017,6:46 PM

## 2017-09-30 ENCOUNTER — Inpatient Hospital Stay
Admission: RE | Admit: 2017-09-30 | Discharge: 2018-01-19 | Disposition: A | Discharge status: Nursing Home (Includes ICF) | Payer: Medicaid Other | Source: Ambulatory Visit | Attending: Internal Medicine | Admitting: Internal Medicine

## 2017-09-30 DIAGNOSIS — Z9889 Other specified postprocedural states: Principal | ICD-10-CM

## 2017-09-30 DIAGNOSIS — Z431 Encounter for attention to gastrostomy: Secondary | ICD-10-CM

## 2017-09-30 DIAGNOSIS — E1122 Type 2 diabetes mellitus with diabetic chronic kidney disease: Secondary | ICD-10-CM

## 2017-09-30 DIAGNOSIS — I10 Essential (primary) hypertension: Secondary | ICD-10-CM

## 2017-09-30 DIAGNOSIS — N186 End stage renal disease: Secondary | ICD-10-CM

## 2017-09-30 DIAGNOSIS — I69354 Hemiplegia and hemiparesis following cerebral infarction affecting left non-dominant side: Secondary | ICD-10-CM

## 2017-09-30 DIAGNOSIS — Z992 Dependence on renal dialysis: Secondary | ICD-10-CM

## 2017-09-30 LAB — GLUCOSE, CAPILLARY
GLUCOSE-CAPILLARY: 86 mg/dL (ref 65–99)
GLUCOSE-CAPILLARY: 87 mg/dL (ref 65–99)
GLUCOSE-CAPILLARY: 92 mg/dL (ref 65–99)
Glucose-Capillary: 125 mg/dL — ABNORMAL HIGH (ref 65–99)
Glucose-Capillary: 83 mg/dL (ref 65–99)

## 2017-09-30 LAB — BASIC METABOLIC PANEL
Anion gap: 6 (ref 5–15)
BUN: 10 mg/dL (ref 6–20)
CHLORIDE: 107 mmol/L (ref 101–111)
CO2: 23 mmol/L (ref 22–32)
CREATININE: 0.46 mg/dL — AB (ref 0.61–1.24)
Calcium: 8.1 mg/dL — ABNORMAL LOW (ref 8.9–10.3)
GFR calc Af Amer: >60 mL/min (ref >60)
GFR calc non Af Amer: >60 mL/min (ref >60)
Glucose, Bld: 95 mg/dL (ref 65–99)
POTASSIUM: 3.7 mmol/L (ref 3.5–5.1)
SODIUM: 136 mmol/L (ref 135–145)

## 2017-09-30 LAB — CBC
HEMATOCRIT: 28.9 % — AB (ref 39.0–52.0)
HEMOGLOBIN: 8.8 g/dL — AB (ref 13.0–17.0)
MCH: 27.8 pg (ref 26.0–34.0)
MCHC: 30.4 g/dL (ref 30.0–36.0)
MCV: 91.2 fL (ref 78.0–100.0)
Platelets: 419 10*3/uL — ABNORMAL HIGH (ref 150–400)
RBC: 3.17 MIL/uL — ABNORMAL LOW (ref 4.22–5.81)
RDW: 15.7 % — ABNORMAL HIGH (ref 11.5–15.5)
WBC: 18.4 10*3/uL — ABNORMAL HIGH (ref 4.0–10.5)

## 2017-09-30 MED ORDER — GLUCERNA 1.2 CAL PO LIQD: 300.0000 mL | Freq: Four times a day (QID) | ORAL | Status: DC

## 2017-09-30 MED ORDER — POLYETHYLENE GLYCOL 3350 17 G PO PACK: 17.0000 g | PACK | Freq: Every day | ORAL | 0 refills | Status: DC | PRN

## 2017-09-30 MED ORDER — SULFAMETHOXAZOLE-TRIMETHOPRIM 800-160 MG PO TABS: 1.0000 | ORAL_TABLET | Freq: Two times a day (BID) | ORAL | 0 refills | Status: AC

## 2017-09-30 NOTE — Progress Notes (Signed)
Patient will Discharge To: [facility] Anticipated DC Date: 09/30/17 Family Notified: Viann Shove, [phone] Transport By: Nurse Larene Beach   Per MD patient ready for DC to [facility] . RN, patient, patient's family, and facility notified of DC. Assessment, Fl2/Pasrr, and Discharge Summary sent to facility. RN given number for report [phone] ask for Vickie). DC packet on chart.  CSW signing off.  Reed Breech LCSWA [phone]

## 2017-09-30 NOTE — Progress Notes (Signed)
Tim the [facility] removed the PICC. Called report to [facility]. Talked with Roselyn Reef and then gave report to [place]. NT wheeled stable pt to [facility].

## 2017-09-30 NOTE — Discharge Summary (Signed)
Physician Discharge Summary  Paige Vanderwoude IRS:[id number] DOB: 1961-06-10 DOA: 09/24/2017  PCP: Virgie Dad, MD  Admit date: 09/24/2017 Discharge date: 09/30/2017  Time spent: 35 minutes  Recommendations for Outpatient Follow-up:  1. Repeat BMET in 5 days to follow electrolytes and renal function  2. Repeat CBC in 5 days to follow WBC's trend  3. Follow wound care instructions.   Discharge Diagnoses:  Active Problems:   Type 2 diabetes mellitus with chronic kidney disease on chronic dialysis, with long-term current use of insulin (HCC)   Sepsis (HCC)   Abscess, perianal   Dislodged gastrostomy tube (HCC)   Benign essential HTN   Hemiparesis affecting left side as late effect of cerebrovascular accident (CVA) ([place])   Discharge Condition: stable and improved. Discharge back to [facility] center for further care and rehabilitation.  Diet recommendation: dysphagia 2 diet, (modified carbohydrates and heart healthy)  Filed Weights   09/29/17 0500 09/29/17 1700 09/30/17 0512  Weight: 80.8 kg (178 lb 2 oz) 89.1 kg (196 lb 6.9 oz) 85.8 kg (189 lb 2.5 oz)    History of present illness:  57 year old male with history of prior CVA and left-sided hemiparesis along with PEG tube, diabetes, and hypertension who was brought to the ED due to "feeling bad."  He apparently pulled his PEG tube out which was replaced with a Foley catheter initially.  He was placed on vancomycin and cefepime for sepsis that appears to be associated to a left and right gluteal abscess.  Please refer to H&P written by Dr. Aggie Moats on 09/24/17 for further details/info on admission.   Hospital Course:  1. Septic shock likely secondary to left-sided gluteal abscess.  Blood culture thus far has remained negative.  Appreciate general surgery evaluation for I&D. General surgery has performed second I&D and has place drain on right gluteal area.  Will discharge on PO antibiotics (Bactirm for 10 more days).  Plan is to keep Penrose  drains in place and the general surgery team will follow patient at [facility] to slowly removed then. Continue wound care as per their instructions. 2. Displaced PEG tube.  Replace and stable.  Patient so far tolerating dysphagia 2 diet. But main nutrition still through PEG. 3. Essential Hypertension - stable and well controlled. Resume home antihypertensive regimen. 4. Diabetes.  Overall with fair control.  Will resume home hypoglycemic regimen. 5. Prior CVA.  Will resume Eliquis for secondary prevention. Continue risk factors modifications and further rehab as per SNF plans at discharge. 6. Left hemiparesis and dysphagia: as late effect from CVA. Continue dysphagia 2 with thin liquids. Close supervision. Main nutrition through PEG. 7. Leukocytosis: overall trending down. No further fever. Continue antibiotics and repeat CBC to follow WBC's trend in 5 days.   Procedures:   L gluteal abscess and right gluteal abscess: I&D 2/18 and 2/21 (respectively)  Consultations:  General surgery   Discharge Exam: Vitals:   09/29/17 2153 09/30/17 0512  BP: 137/67 135/70  Pulse: (!) 111 100  Resp:    Temp: 99.6 F (37.6 C) 98.7 F (37.1 C)  SpO2: 98% 99%   General exam: Patient afebrile for 48 hours now, reports some mild discomfort around his gluteal area otherwise in no acute distress.  No chest pain, no shortness of breath. No nausea or vomiting. Respiratory system: Good air movement bilaterally, no wheezing, no crackles; positive rhonchi appreciated. Cardiovascular system: No JVD, no rubs, no gallops; positive S1 and S2 on examination. Gastrointestinal system: Soft, nontender, positive bowel sounds,  PEG tube in place. Central nervous system: Neurologic exam remains unchanged. He is AAOX3, without new deficit appreciated. Chronic left hemiplegia as residual deficit from previous stroke unchanged.  Extremities: some atrophy appreciated on left side; no cyanosis, no clubbing.  Skin:  Patient with open left abscess on his left buttock with drain in place. And now with I&D on right gluteal with drain in place.  Psychiatry: unchanged psych features; patient with Normal judgment, good insight and appropriate mood. Able to follow commands appropriately.       Discharge Instructions   Discharge Instructions    Diet - low sodium heart healthy   Complete by:  As directed    Discharge instructions   Complete by:  As directed    Take medications as prescribed Maintain adequate hydration Follow modified carb diet Follow wound care instructions as pero general surgery rec's.     Allergies as of 09/30/2017      Reactions   Penicillins Hives      Medication List    TAKE these medications   acetaminophen 325 MG tablet Commonly known as:  TYLENOL Place 650 mg into feeding tube every 4 (four) hours as needed.   apixaban 5 MG Tabs tablet Commonly known as:  ELIQUIS Place 1 tablet (5 mg total) into feeding tube 2 (two) times daily.   bisacodyl 10 MG suppository Commonly known as:  DULCOLAX Place 1 suppository (10 mg total) rectally daily as needed for moderate constipation.   chlorhexidine 0.12 % solution Commonly known as:  PERIDEX 15 mLs by Mouth Rinse route 2 (two) times daily.   docusate 50 MG/5ML liquid Commonly known as:  COLACE Place 10 mLs (100 mg total) into feeding tube 2 (two) times daily as needed for mild constipation.   feeding supplement (ENSURE ENLIVE) Liqd Take 237 mLs by mouth 2 (two) times daily between meals. What changed:  Another medication with the same name was changed. Make sure you understand how and when to take each.   feeding supplement (GLUCERNA 1.2 CAL) Liqd Place 300 mLs into feeding tube 4 (four) times daily. What changed:    how much to take  how to take this  when to take this  additional instructions   feeding supplement (PRO-STAT SUGAR FREE 64) Liqd Place 30 mLs into feeding tube daily. Per gastric tube   free  water Soln Place 300 mLs into feeding tube every 4 (four) hours. Keep HOB elevated at 45 degrees at all times during feedings, flushings, and medications 12a, 4a, 8a, 12p, 4p,8p   gabapentin 300 MG capsule Commonly known as:  NEURONTIN Take 1 capsule (300 mg total) by mouth at bedtime.   insulin detemir 100 UNIT/ML injection Commonly known as:  LEVEMIR Inject 0.34 mLs (34 Units total) into the skin daily.   metoprolol tartrate 25 MG tablet Commonly known as:  LOPRESSOR Place 0.5 tablets (12.5 mg total) into feeding tube 2 (two) times daily.   mirtazapine 15 MG tablet Commonly known as:  REMERON Take 7.5 mg by mouth at bedtime.   pantoprazole sodium 40 mg/20 mL Pack Commonly known as:  PROTONIX Place 20 mLs (40 mg total) into feeding tube daily.   polyethylene glycol packet Commonly known as:  MIRALAX / GLYCOLAX Place 17 g into feeding tube daily as needed. What changed:    when to take this  reasons to take this   RECLAST 5 MG/100ML Soln injection Generic drug:  zoledronic acid Inject 5 mg into the vein once.  sulfamethoxazole-trimethoprim 800-160 MG tablet Commonly known as:  BACTRIM DS,SEPTRA DS Take 1 tablet by mouth every 12 (twelve) hours for 9 days.   Vitamin D3 5000 units Caps Take 1 capsule (5,000 Units total) by mouth daily. What changed:  how to take this   witch hazel-glycerin pad Commonly known as:  TUCKS Apply 1 application topically as needed for itching.      Allergies  Allergen Reactions  . Penicillins Hives    The results of significant diagnostics from this hospitalization (including imaging, microbiology, ancillary and laboratory) are listed below for reference.    Significant Diagnostic Studies: Ct Pelvis W Contrast  Result Date: 09/27/2017 CLINICAL DATA:  Left buttock pain for 3 days. Evaluate for left gluteal abscess. EXAM: CT PELVIS WITH CONTRAST TECHNIQUE: Multidetector CT imaging of the pelvis was performed using the standard  protocol following the bolus administration of intravenous contrast. CONTRAST:  170mL ISOVUE-300 IOPAMIDOL (ISOVUE-300) INJECTION 61% COMPARISON:  09/24/2017 FINDINGS: Since the study of 3 days ago, there has been placement of a drainage catheter in the left buttock abscess. The left-sided collection is largely evacuated. There is some regional edematous change. Small fluid collection to the right-sided midline in the right fatty buttock persists, measuring about 2.8 cm in diameter. Small left posterior perirectal abscess persists, 2 cm in diameter. No new or progressive finding. No evidence of deep space infection. In the pelvis itself, there is no finding of note. IMPRESSION: Catheter drainage of a left buttock abscess. Some persistent collection in the right buttock fatty tissues, measuring 2.8 cm in maximal diameter. Small persistent left posterior perirectal/perianal collection unchanged at 2 cm in diameter. Electronically Signed   By: Nelson Chimes M.D.   On: 09/27/2017 15:09   Ct Abdomen Pelvis W Contrast  Result Date: 09/24/2017 CLINICAL DATA:  Abdominal pain at PEG tube site after pulling at out accidentally. Weakness and increased pain to right-sided flank. EXAM: CT ABDOMEN AND PELVIS WITH CONTRAST TECHNIQUE: Multidetector CT imaging of the abdomen and pelvis was performed using the standard protocol following bolus administration of intravenous contrast. CONTRAST:  199mL ISOVUE-300 IOPAMIDOL (ISOVUE-300) INJECTION 61% COMPARISON:  CT abdomen dated 04/22/2017. FINDINGS: Lower chest: No acute abnormality. Hepatobiliary: Stable hemangioma within the left liver lobe. No acute or suspicious abnormality within the liver. Gallbladder is not seen, presumed cholecystectomy. No bile duct dilatation. Pancreas: Unremarkable. No pancreatic ductal dilatation or surrounding inflammatory changes. Spleen: Normal in size without focal abnormality. Adrenals/Urinary Tract: Adrenal glands appear normal. Kidneys appear  normal without mass, stone or hydronephrosis. Bladder appears normal. Stomach/Bowel: Worsening appearance of the perianal soft tissues. There is now a defined abscess collection to the left of the gluteal fold, perianal, measuring at least 6.7 x 3.4 cm, incompletely imaged at the lower aspects of the exam. Smaller collection to the right of the gluteal fold, measuring 2.3 cm, also incompletely imaged. An additional 1.9 cm collection more centrally near the anorectal junction. Associated thickening of the walls of the lower rectum. No dilated large or small bowel loops. Appendix is normal. Per clinical data, Foley catheter tube in place of the PEG tube, well positioned in the stomach. No fluid collection or evidence of inflammatory change at the catheter entrance site. Vascular/Lymphatic: No significant vascular findings are present. No enlarged abdominal or pelvic lymph nodes. Reproductive: Mild prominence of the prostate gland causing slight mass effect on the bladder base. Other: No free intraperitoneal air. Musculoskeletal: No acute or suspicious osseous finding. Mild degenerative change throughout the thoracolumbar spine. IMPRESSION:  1. Per report, Foley catheter was placed after PEG tube was accidentally removed. The Foley catheter appears well positioned in the stomach. No fluid collection, inflammatory change or other procedural complicating feature at the PEG tube insertion site. 2. WORSENED appearance of the PERIANAL soft tissues. There is now a defined abscess collection to the left of the gluteal fold, perianal, measuring at least 6.7 cm greatest dimension, but incompletely imaged at the lower aspects of the exam. Smaller abscess collection to the right of the gluteal fold measures 2.3 cm, also incompletely imaged. Additional 1.9 cm abscess collection more centrally near the anal rectal junction. Associated thickening of the walls of the lower rectum, with surrounding perirectal inflammation, but no  extension of the defined abscess collection into the perirectal fat of the lower pelvis. 3. Additional chronic/incidental findings detailed above. Electronically Signed   By: Franki Cabot M.D.   On: 09/24/2017 22:29   Dg Chest Portable 1 View  Result Date: 09/24/2017 CLINICAL DATA:  Fever. EXAM: PORTABLE CHEST 1 VIEW COMPARISON:  05/26/2017 FINDINGS: The heart size and mediastinal contours are within normal limits. Both lungs are clear. The visualized skeletal structures are unremarkable. IMPRESSION: No active disease. Electronically Signed   By: Kerby Moors M.D.   On: 09/24/2017 16:15    Microbiology: Recent Results (from the past 240 hour(s))  Culture, blood (Routine x 2)     Status: None   Collection Time: 09/24/17  3:29 PM  Result Value Ref Range Status   Specimen Description   Final    BLOOD LEFT ANTECUBITAL Blood Culture adequate volume BOTTLES DRAWN AEROBIC ONLY   Special Requests NONE  Final   Culture   Final    NO GROWTH 5 DAYS Performed at [facility], [address] [place], [place] [address]    Report Status 09/29/2017 FINAL  Final  MRSA PCR Screening     Status: None   Collection Time: 09/24/17 11:30 PM  Result Value Ref Range Status   MRSA by PCR NEGATIVE NEGATIVE Final    Comment:        The GeneXpert MRSA Assay (FDA approved for NASAL specimens only), is one component of a comprehensive MRSA colonization surveillance program. It is not intended to diagnose MRSA infection nor to guide or monitor treatment for MRSA infections. Performed at [facility], [address] [place], [place] [address]   Aerobic Culture (superficial specimen)     Status: None   Collection Time: 09/25/17  1:55 PM  Result Value Ref Range Status   Specimen Description   Final    ABSCESS Performed at [facility], [address], [place], [place] [address]    Special Requests   Final    Normal Performed at [facility], [address] [place], [place] [address]    Gram Stain    Final    MODERATE WBC PRESENT, PREDOMINANTLY PMN FEW GRAM POSITIVE COCCI IN PAIRS IN CLUSTERS RARE GRAM NEGATIVE RODS    Culture   Final    FEW NORMAL SKIN FLORA Performed at [facility], [place] [address] [place], [place] [address]    Report Status 09/28/2017 FINAL  Final  Aerobic Culture (superficial specimen)     Status: None (Preliminary result)   Collection Time: 09/28/17  8:20 AM  Result Value Ref Range Status   Specimen Description   Final    ABSCESS Performed at [facility], [address], [place], [place] [address]    Special Requests   Final  NONE Performed at [facility], [address] [place], [place] [address]    Gram Stain NO WBC SEEN RARE GRAM VARIABLE ROD   Final   Culture   Final    RARE ENTEROCOCCUS SPECIES CULTURE REINCUBATED FOR BETTER GROWTH Performed at [facility], [place] [address], [place], [place] [address]    Report Status PENDING  Incomplete     Labs: Basic Metabolic Panel: Recent Labs  Lab 09/24/17 1527 09/25/17 0531 09/25/17 0545 09/26/17 0439 09/30/17 0710  NA 140 141  --  136 136  K 4.2 3.4*  --  3.5 3.7  CL 103 112*  --  110 107  CO2 26 19*  --  19* 23  GLUCOSE 141* 146*  --  89 95  BUN 24* 13  --  7 10  CREATININE 0.90 0.69  --  0.55* 0.46*  CALCIUM 9.2 8.0*  --  7.7* 8.1*  MG  --   --  2.1  --   --   PHOS 2.2*  --   --   --   --    Liver Function Tests: Recent Labs  Lab 09/24/17 1527  AST 34  ALT 59  ALKPHOS 102  BILITOT 1.0  PROT 8.3*  ALBUMIN 2.6*   Recent Labs  Lab 09/24/17 1527  LIPASE 35   CBC: Recent Labs  Lab 09/24/17 1527 09/25/17 0531 09/26/17 0439 09/27/17 1142 09/30/17 0710  WBC 23.8* 25.0* 19.4* 18.8* 18.4*  NEUTROABS 19.0*  --   --   --   --   HGB 11.9* 10.1* 9.4* 10.2* 8.8*  HCT 38.9* 33.7* 31.2* 33.8* 28.9*  MCV 90.7 91.3 90.2 89.4 91.2  PLT 271 290 311 366 419*    CBG: Recent Labs  Lab 09/30/17 0005 09/30/17 0501 09/30/17 0509 09/30/17 0754 09/30/17 1316   GLUCAP 112* 86 87 92 125*    Signed:  Barton Dubois MD.  [facility] 09/30/2017, 2:31 PM

## 2017-09-30 NOTE — Progress Notes (Signed)
[facility] Progress Note  2 Days Post-Op  Subjective: No major issues. Again has soiled himself and RNs having to clean.  Objective: Vital signs in last 24 hours: Temp:  [98.7 F (37.1 C)-101 F (38.3 C)] 98.7 F (37.1 C) (02/23 0512) Pulse Rate:  [100-115] 100 (02/23 0512) Resp:  [18] 18 (02/22 1700) BP: (120-137)/(67-70) 135/70 (02/23 0512) SpO2:  [97 %-100 %] 99 % (02/23 0512) Weight:  [189 lb 2.5 oz (85.8 kg)-196 lb 6.9 oz (89.1 kg)] 189 lb 2.5 oz (85.8 kg) (02/23 0512) Last BM Date: 09/29/17  Intake/Output from previous day: 02/22 0701 - 02/23 0700 In: -  Out: 4200 [Urine:4200] Intake/Output this shift: No intake/output data recorded.  General appearance: alert, cooperative and no distress Resp: normal work of breathing penrose drains in perianal region, minimal induration, some drainage but difficult to say given that he was just cleaned   Lab Results:  Recent Labs    09/27/17 1142 09/30/17 0710  WBC 18.8* 18.4*  HGB 10.2* 8.8*  HCT 33.8* 28.9*  PLT 366 419*   BMET Recent Labs    09/30/17 0710  NA 136  K 3.7  CL 107  CO2 23  GLUCOSE 95  BUN 10  CREATININE 0.46*  CALCIUM 8.1*    Anti-infectives: Anti-infectives (From admission, onward)   Start     Dose/Rate Route Frequency Ordered Stop   09/29/17 1800  sulfamethoxazole-trimethoprim (BACTRIM DS,SEPTRA DS) 800-160 MG per tablet 1 tablet     1 tablet Oral Every 12 hours 09/29/17 1752     09/27/17 1700  vancomycin (VANCOCIN) 1,250 mg in sodium chloride 0.9 % 250 mL IVPB  Status:  Discontinued     1,250 mg 166.7 mL/hr over 90 Minutes Intravenous Every 12 hours 09/27/17 1652 09/29/17 1752   09/25/17 2200  ceFEPIme (MAXIPIME) 500 mg in dextrose 5 % 50 mL IVPB  Status:  Discontinued     500 mg 100 mL/hr over 30 Minutes Intravenous Every 8 hours 09/24/17 1920 09/25/17 0134   09/25/17 1000  metroNIDAZOLE (FLAGYL) IVPB 500 mg  Status:  Discontinued     500 mg 100 mL/hr over 60 Minutes  Intravenous Every 8 hours 09/25/17 0912 09/29/17 1752   09/25/17 0500  vancomycin (VANCOCIN) IVPB 1000 mg/200 mL premix  Status:  Discontinued     1,000 mg 200 mL/hr over 60 Minutes Intravenous Every 12 hours 09/25/17 0204 09/27/17 1652   09/25/17 0230  ceFEPIme (MAXIPIME) 1 g in sodium chloride 0.9 % 100 mL IVPB  Status:  Discontinued     1 g 200 mL/hr over 30 Minutes Intravenous Every 8 hours 09/25/17 0159 09/29/17 1752   09/25/17 0145  ceFEPIme (MAXIPIME) 500 mg in dextrose 5 % 50 mL IVPB  Status:  Discontinued     500 mg 100 mL/hr over 30 Minutes Intravenous Every 8 hours 09/25/17 0134 09/25/17 0159   09/24/17 1530  vancomycin (VANCOCIN) IVPB 1000 mg/200 mL premix     1,000 mg 200 mL/hr over 60 Minutes Intravenous  Once 09/24/17 1515 09/24/17 1730   09/24/17 1530  piperacillin-tazobactam (ZOSYN) IVPB 3.375 g     3.375 g 100 mL/hr over 30 Minutes Intravenous  Once 09/24/17 1515 09/24/17 1629      Assessment/Plan: Mr. 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  -Continue antibiotics  -Can d/c back to SNF and I will follow as outpatient to slowly remove the drains  LOS: 6 days   Virl Cagey 09/30/2017

## 2017-10-02 ENCOUNTER — Encounter (HOSPITAL_COMMUNITY): Payer: Self-pay | Admitting: General Surgery

## 2017-10-02 ENCOUNTER — Telehealth: Payer: Self-pay

## 2017-10-02 ENCOUNTER — Encounter (HOSPITAL_COMMUNITY)
Admission: RE | Admit: 2017-10-02 | Discharge: 2017-10-02 | Disposition: A | Discharge status: Home / Self Care | Payer: Medicaid Other | Source: Skilled Nursing Facility | Attending: *Deleted | Admitting: *Deleted

## 2017-10-02 LAB — CBC
HCT: 34.6 % — ABNORMAL LOW (ref 39.0–52.0)
HEMOGLOBIN: 10.1 g/dL — AB (ref 13.0–17.0)
MCH: 27.2 pg (ref 26.0–34.0)
MCHC: 29.2 g/dL — AB (ref 30.0–36.0)
MCV: 93 fL (ref 78.0–100.0)
Platelets: 525 10*3/uL — ABNORMAL HIGH (ref 150–400)
RBC: 3.72 MIL/uL — AB (ref 4.22–5.81)
RDW: 16.5 % — ABNORMAL HIGH (ref 11.5–15.5)
WBC: 17.2 10*3/uL — AB (ref 4.0–10.5)

## 2017-10-02 LAB — BASIC METABOLIC PANEL
ANION GAP: 8 (ref 5–15)
BUN: 12 mg/dL (ref 6–20)
CHLORIDE: 108 mmol/L (ref 101–111)
CO2: 22 mmol/L (ref 22–32)
CREATININE: 0.7 mg/dL (ref 0.61–1.24)
Calcium: 8.7 mg/dL — ABNORMAL LOW (ref 8.9–10.3)
GFR calc non Af Amer: >60 mL/min (ref >60)
Glucose, Bld: 135 mg/dL — ABNORMAL HIGH (ref 65–99)
Potassium: 3.9 mmol/L (ref 3.5–5.1)
SODIUM: 138 mmol/L (ref 135–145)

## 2017-10-02 LAB — AEROBIC CULTURE W GRAM STAIN (SUPERFICIAL SPECIMEN): Gram Stain: NONE SEEN

## 2017-10-02 LAB — AEROBIC CULTURE  (SUPERFICIAL SPECIMEN)

## 2017-10-02 NOTE — Telephone Encounter (Signed)
Possible re-admission to facility. This is a patient you were seeing at [facility]. [facility] F/U is needed if patient was re-admitted to facility upon discharge. Hospital discharge from [facility] on 09/30/2017

## 2017-10-03 ENCOUNTER — Non-Acute Institutional Stay (SKILLED_NURSING_FACILITY): Payer: Medicaid Other | Admitting: Internal Medicine

## 2017-10-03 ENCOUNTER — Encounter: Payer: Self-pay | Admitting: Internal Medicine

## 2017-10-03 DIAGNOSIS — L0231 Cutaneous abscess of buttock: Secondary | ICD-10-CM | POA: Insufficient documentation

## 2017-10-03 DIAGNOSIS — I63411 Cerebral infarction due to embolism of right middle cerebral artery: Secondary | ICD-10-CM

## 2017-10-03 DIAGNOSIS — I69354 Hemiplegia and hemiparesis following cerebral infarction affecting left non-dominant side: Secondary | ICD-10-CM

## 2017-10-03 DIAGNOSIS — Z9889 Other specified postprocedural states: Secondary | ICD-10-CM | POA: Diagnosis not present

## 2017-10-03 DIAGNOSIS — I480 Paroxysmal atrial fibrillation: Secondary | ICD-10-CM

## 2017-10-03 DIAGNOSIS — Z931 Gastrostomy status: Secondary | ICD-10-CM | POA: Diagnosis not present

## 2017-10-03 DIAGNOSIS — E108 Type 1 diabetes mellitus with unspecified complications: Secondary | ICD-10-CM | POA: Diagnosis not present

## 2017-10-03 NOTE — Progress Notes (Signed)
Provider:  Veleta Miners Location:   [facility] Room Number: 139/W Place of Service:  SNF (31)  PCP: Virgie Dad, MD Patient Care Team: Virgie Dad, MD as PCP - General (Internal Medicine)  Extended Emergency Contact Information Primary Emergency Contact: Theresia Bough [place] of [place] Mobile Phone: [phone] Relation: Sister Secondary Emergency Contact: Nickie Retort [place] of [place] Mobile Phone: [phone] Relation: Aunt  Code Status: Full Code Goals of Care: Advanced Directive information Advanced Directives 10/03/2017  Does Patient Have a Medical Advance Directive? Yes  Type of Advance Directive (No Data)  Does patient want to make changes to medical advance directive? No - Patient declined  Would patient like information on creating a medical advance directive? No - Patient declined      Chief Complaint  Patient presents with  . Readmit To SNF    Readmission Visit    HPI: Patient is a 57 y.o. male seen today for admission to Facility for Wound Care and Long term Placement.  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 He was send from the facility to ED when he Pulled his Peg Tube. In ED he was found to be septic with Low BP and Leucocytosis. His CT scan showed Perianal Abscess. He was started on IV antibiotics and Surgery was consulted. Surgery took patient for Incision and Drainage on 02/21 under General anesthesia. He also had penrose drains placed. He is discharged to the facility on bactrim with plan for surgery to slowly remove the drains. Patient is doing well in facility. He is only c/o Pain in his Bottom. He denies any fever or chills. Therapy still is not planning to work with him  due to his significant cognitive impairment    Past Medical History:  Diagnosis Date  . DM (diabetes mellitus) ([place])   . Hypertension   . Stroke [facility])    Past Surgical History:  Procedure Laterality Date  . CRANIECTOMY Right 04/01/2017   Procedure: RIGHT DECOMPRESSIVE CRANIECTOMY;  Surgeon: Ditty, Kevan Ny, MD;  Location: [place];  Service: Neurosurgery;  Laterality: Right;  . ESOPHAGOGASTRODUODENOSCOPY N/A 04/13/2017   Procedure: ESOPHAGOGASTRODUODENOSCOPY (EGD);  Surgeon: Georganna Skeans, MD;  Location: [place];  Service: General;  Laterality: N/A;  bedside  . INCISION AND DRAINAGE PERIRECTAL ABSCESS N/A 09/28/2017   Procedure: IRRIGATION AND DEBRIDEMENT PERIANAL ABSCESS;  Surgeon: Virl Cagey, MD;  Location: [facility];  Service: General;  Laterality: N/A;  . PEG PLACEMENT N/A 04/13/2017   Procedure: PERCUTANEOUS ENDOSCOPIC GASTROSTOMY (PEG) PLACEMENT;  Surgeon: Georganna Skeans, MD;  Location: [place];  Service: General;  Laterality: N/A;    reports that he has quit smoking. His smoking use included cigarettes. he has never used smokeless tobacco. He reports that he does not drink alcohol or use drugs. Social History   Socioeconomic History  . Marital status: Unknown    Spouse name: Not on file  . Number of children: Not on file  . Years of education: Not on file  . Highest education level: Not on file  Social Needs  . Financial resource strain: Not on file  . Food insecurity - worry: Not on file  . Food insecurity - inability: Not on file  . Transportation needs - medical: Not on file  . Transportation needs - non-medical: Not on file  Occupational History  .  Not on file  Tobacco Use  . Smoking status: Former Smoker    Types: Cigarettes  . Smokeless tobacco: Never Used  . Tobacco comment: UTA  Substance and Sexual Activity  . Alcohol use: No    Comment: UTA  . Drug use: No    Comment: UTA  . Sexual activity: Not Currently    Birth control/protection:  None  Other Topics Concern  . Not on file  Social History Narrative  . Not on file    Functional Status Survey:    History reviewed. No pertinent family history.  Health Maintenance  Topic Date Due  . PNEUMOCOCCAL POLYSACCHARIDE VACCINE (1) 10/31/2017 (Originally 04/06/1963)  . INFLUENZA VACCINE  10/31/2017 (Originally 03/08/2017)  . OPHTHALMOLOGY EXAM  10/31/2017 (Originally 04/06/1971)  . URINE MICROALBUMIN  10/31/2017 (Originally 04/06/1971)  . COLONOSCOPY  10/31/2017 (Originally 04/06/2011)  . TETANUS/TDAP  10/31/2017 (Originally 04/05/1980)  . Hepatitis C Screening  10/31/2017 (Originally [birth year])  . HEMOGLOBIN A1C  01/09/2018  . FOOT EXAM  08/24/2018  . HIV Screening  Completed    Allergies  Allergen Reactions  . Penicillins Hives    Outpatient Encounter Medications as of 10/03/2017  Medication Sig  . acetaminophen (TYLENOL) 325 MG tablet Place 650 mg into feeding tube every 4 (four) hours as needed.  . Amino Acids-Protein Hydrolys (FEEDING SUPPLEMENT, PRO-STAT SUGAR FREE 64,) LIQD Place 30 mLs into feeding tube daily. Per gastric tube   . apixaban (ELIQUIS) 5 MG TABS tablet Place 1 tablet (5 mg total) into feeding tube 2 (two) times daily.  . bisacodyl (DULCOLAX) 10 MG suppository Place 1 suppository (10 mg total) rectally daily as needed for moderate constipation.  . chlorhexidine (PERIDEX) 0.12 % solution 15 mLs by Mouth Rinse route 2 (two) times daily.  . Cholecalciferol (VITAMIN D3) 50000 units TABS Give 5 tablets gastric tube once a day  . docusate (COLACE) 50 MG/5ML liquid Place 10 mLs (100 mg total) into feeding tube 2 (two) times daily as needed for mild constipation.  . feeding supplement, ENSURE ENLIVE, (ENSURE ENLIVE) LIQD Take 237 mLs by mouth 2 (two) times daily between meals.  . gabapentin (NEURONTIN) 300 MG capsule Take 1 capsule (300 mg total) by mouth at bedtime.  . insulin detemir (LEVEMIR) 100 UNIT/ML injection Inject 0.34 mLs (34 Units total) into the  skin daily.  . metoprolol tartrate (LOPRESSOR) 25 MG tablet Place 0.5 tablets (12.5 mg total) into feeding tube 2 (two) times daily.  . mirtazapine (REMERON) 15 MG tablet Take 7.5 mg by mouth at bedtime.  . Nutritional Supplements (FEEDING SUPPLEMENT, GLUCERNA 1.2 CAL,) LIQD Place 300 mLs into feeding tube 4 (four) times daily.  . pantoprazole sodium (PROTONIX) 40 mg/20 mL PACK Place 20 mLs (40 mg total) into feeding tube daily.  . polyethylene glycol (MIRALAX / GLYCOLAX) packet Place 17 g into feeding tube daily as needed.  . sulfamethoxazole-trimethoprim (BACTRIM DS,SEPTRA DS) 800-160 MG tablet Take 1 tablet by mouth every 12 (twelve) hours for 9 days.  . Water For Irrigation, Sterile (FREE WATER) SOLN Place 300 mLs into feeding tube every 4 (four) hours. Keep HOB elevated at 45 degrees at all times during feedings, flushings, and medications 12a, 4a, 8a, 12p, 4p,8p  . zoledronic acid (RECLAST) 5 MG/100ML SOLN injection Inject 5 mg into the vein once.  . [DISCONTINUED] Cholecalciferol (VITAMIN D3) 5000 units CAPS Take 1 capsule (5,000 Units total) by mouth daily.  . [DISCONTINUED] witch hazel-glycerin (TUCKS) pad Apply 1 application topically as needed for itching.  No facility-administered encounter medications on file as of 10/03/2017.      Review of Systems  Review of Systems  Constitutional: Negative for activity change, appetite change, chills, diaphoresis, fatigue and fever.  HENT: Negative for mouth sores, postnasal drip, rhinorrhea, sinus pain and sore throat.   Respiratory: Negative for apnea, cough, chest tightness, shortness of breath and wheezing.   Cardiovascular: Negative for chest pain, palpitations and leg swelling.  Gastrointestinal: Negative for abdominal distention, abdominal pain, constipation, diarrhea, nausea and vomiting.  Genitourinary: Negative for dysuria and frequency.  Musculoskeletal: Negative for arthralgias, joint swelling and myalgias.  Skin: Negative for  rash.  Neurological: Negative for dizziness, syncope, weakness, light-headedness and numbness.  Psychiatric/Behavioral: Negative for behavioral problems, confusion and sleep disturbance.     Vitals:   10/03/17 1128  BP: 137/61  Pulse: (!) 58  Resp: 17  Temp: 97.8 F (36.6 C)  TempSrc: Oral  SpO2: 98%   There is no height or weight on file to calculate BMI. Physical Exam  Constitutional: He appears well-developed and well-nourished.  HENT:  Mouth/Throat: Oropharynx is clear and moist.  S/P Cranioplasty  Eyes: Pupils are equal, round, and reactive to light.  Neck: Neck supple.  Cardiovascular: Normal rate and normal heart sounds.  No murmur heard. Pulmonary/Chest: Effort normal and breath sounds normal. No respiratory distress. He has no wheezes. He has no rales.  Abdominal: Soft. Bowel sounds are normal. He exhibits no distension. There is no tenderness. There is no rebound.  Has Peg Tube in Place  Musculoskeletal: He exhibits no edema.  Lymphadenopathy:    He has no cervical adenopathy.  Neurological: He is alert.  Left Hemiparesis  Skin: Skin is warm and dry.  Wound seen with Nurse. Has drains.with clean drainage. No Odor or swelling or redness  Psychiatric: He has a normal mood and affect. His behavior is normal. Thought content normal.    Labs reviewed: Basic Metabolic Panel: Recent Labs    04/12/17 0151  09/05/17 0709 09/24/17 1527  09/25/17 0545 09/26/17 0439 09/30/17 0710 10/02/17 0400  NA 139   < >  --  140   < >  --  136 136 138  K 3.8   < >  --  4.2   < >  --  3.5 3.7 3.9  CL 106   < >  --  103   < >  --  110 107 108  CO2 26   < >  --  26   < >  --  19* 23 22  GLUCOSE 147*   < >  --  141*   < >  --  89 95 135*  BUN 15   < >  --  24*   < >  --  7 10 12   CREATININE 0.75   < >  --  0.90   < >  --  0.55* 0.46* 0.70  CALCIUM 8.5*   < > 11.5* 9.2   < >  --  7.7* 8.1* 8.7*  MG 2.2  --  2.1  --   --  2.1  --   --   --   PHOS 4.4  --  3.7 2.2*  --   --    --   --   --    < > = values in this interval not displayed.   Liver Function Tests: Recent Labs    07/20/17 1100 07/27/17 0704 09/24/17 1527  AST 21 16 34  ALT  33 26 59  ALKPHOS 62 61 102  BILITOT 0.6 0.7 1.0  PROT 7.7 7.3 8.3*  ALBUMIN 3.8 3.3* 2.6*   Recent Labs    03/30/17 1808 05/02/17 0527 09/24/17 1527  LIPASE 181* 33 35   No results for input(s): AMMONIA in the last 8760 hours. CBC: Recent Labs    06/29/17 0930 07/11/17 1025 09/24/17 1527  09/27/17 1142 09/30/17 0710 10/02/17 0400  WBC 10.0 11.9* 23.8*   < > 18.8* 18.4* 17.2*  NEUTROABS 5.8 7.2 19.0*  --   --   --   --   HGB 13.6 14.2 11.9*   < > 10.2* 8.8* 10.1*  HCT 45.9 47.5 38.9*   < > 33.8* 28.9* 34.6*  MCV 90.9 90.1 90.7   < > 89.4 91.2 93.0  PLT 184 232 271   < > 366 419* 525*   < > = values in this interval not displayed.   Cardiac Enzymes: Recent Labs    04/04/17 0148 04/04/17 0932 04/04/17 1500  TROPONINI 0.04* 0.03* 0.04*   BNP: Invalid input(s): POCBNP Lab Results  Component Value Date   HGBA1C 5.8 (H) 07/11/2017   Lab Results  Component Value Date   TSH 0.913 07/17/2017   Lab Results  Component Value Date   VITAMINB12 519 04/04/2017   Lab Results  Component Value Date   FOLATE 14.4 04/04/2017   Lab Results  Component Value Date   IRON 42 (L) 04/04/2017   TIBC 116 (L) 04/04/2017   FERRITIN 2,186 (H) 04/04/2017    Imaging and Procedures obtained prior to SNF admission: No results found.  Assessment/Plan  Gluteal Abscess S/P I&D with Drain placement Patient on Bactrim His White count is still elevated But clinically he is doing well. Will be followe dby Surgery in Facility. Repeat CBC Vitals Q shift Leucocytosis Repeat CBC Type 1 diabetes mellitus with complications  Last N4O was 5.8 in 12/04 BS are running mostly less then 150 Now only on Levimir  AF (paroxysmal atrial fibrillation)  In Sinus rhythm On Eliquis  Cerebrovascular accident (CVA) due to  embolism of right middle cerebral artery  On Eliquis No therapy Continues to be full Nursing care LDL less then 60 BP Controlled  Follows wiht Neurology Essential hypertension On Metoprolol  S/P PEG Placement Patient continues to need PEG tube . Peg Tube was changed in the ED. Has Foley  Status post craniectomy Follow up With Dr Cyndy Freeze.  Hypercalcemia On Iv Reclast Thought to be due to Immobility Follows with Dr Dorris Fetch. Family/ staff Communication:   Labs/tests ordered:   Total time spent in this patient care encounter was 45_ minutes; greater than 50% of the visit spent counseling patient, reviewing records , Labs and coordinating care for problems addressed at this encounter.

## 2017-10-04 ENCOUNTER — Ambulatory Visit (INDEPENDENT_AMBULATORY_CARE_PROVIDER_SITE_OTHER): Payer: Medicaid Other | Admitting: "Endocrinology

## 2017-10-04 ENCOUNTER — Encounter: Payer: Self-pay | Admitting: "Endocrinology

## 2017-10-04 DIAGNOSIS — E559 Vitamin D deficiency, unspecified: Secondary | ICD-10-CM | POA: Diagnosis not present

## 2017-10-04 MED ORDER — CALCIUM CARBONATE ANTACID 500 MG PO CHEW: 1.0000 | CHEWABLE_TABLET | Freq: Every day | ORAL | 1 refills | Status: DC

## 2017-10-04 NOTE — Progress Notes (Signed)
Endocrinology follow-up note                                            10/04/2017, 1:29 PM   Subjective:    Patient ID: Joe Wilkinson, male    DOB: Sep 10, 1960, PCP Virgie Dad, MD   Past Medical History:  Diagnosis Date  . DM (diabetes mellitus) ([place])   . Hypertension   . Stroke [facility])    Past Surgical History:  Procedure Laterality Date  . CRANIECTOMY Right 04/01/2017   Procedure: RIGHT DECOMPRESSIVE CRANIECTOMY;  Surgeon: Ditty, Kevan Ny, MD;  Location: [place];  Service: Neurosurgery;  Laterality: Right;  . ESOPHAGOGASTRODUODENOSCOPY N/A 04/13/2017   Procedure: ESOPHAGOGASTRODUODENOSCOPY (EGD);  Surgeon: Georganna Skeans, MD;  Location: [place];  Service: General;  Laterality: N/A;  bedside  . INCISION AND DRAINAGE PERIRECTAL ABSCESS N/A 09/28/2017   Procedure: IRRIGATION AND DEBRIDEMENT PERIANAL ABSCESS;  Surgeon: Virl Cagey, MD;  Location: [facility];  Service: General;  Laterality: N/A;  . PEG PLACEMENT N/A 04/13/2017   Procedure: PERCUTANEOUS ENDOSCOPIC GASTROSTOMY (PEG) PLACEMENT;  Surgeon: Georganna Skeans, MD;  Location[facility];  Service: General;  Laterality: N/A;   Social History   Socioeconomic History  . Marital status: Unknown    Spouse name: None  . Number of children: None  . Years of education: None  . Highest education level: None  Social Needs  . Financial resource strain: None  . Food insecurity - worry: None  . Food insecurity - inability: None  . Transportation needs - medical: None  . Transportation needs - non-medical: None  Occupational History  . None  Tobacco Use  . Smoking status: Former Smoker    Types: Cigarettes  . Smokeless tobacco: Never Used  . Tobacco comment: UTA  Substance and Sexual Activity  . Alcohol use: No    Comment: UTA  . Drug use: No    Comment: UTA  . Sexual activity: Not Currently    Birth control/protection: None  Other Topics Concern  . None  Social History Narrative  . None    Outpatient Encounter Medications as of 10/04/2017  Medication Sig  . acetaminophen (TYLENOL) 325 MG tablet Place 650 mg into feeding tube every 4 (four) hours as needed.  . Amino Acids-Protein Hydrolys (FEEDING SUPPLEMENT, PRO-STAT SUGAR FREE 64,) LIQD Place 30 mLs into feeding tube daily. Per gastric tube   . apixaban (ELIQUIS) 5 MG TABS tablet Place 1 tablet (5 mg total) into feeding tube 2 (two) times daily.  . bisacodyl (DULCOLAX) 10 MG suppository Place 1 suppository (10 mg total) rectally daily as needed for moderate constipation.  . calcium carbonate (TUMS) 500 MG chewable tablet Chew 1 tablet (200 mg of elemental calcium total) by mouth daily.  . chlorhexidine (PERIDEX) 0.12 % solution 15 mLs by Mouth Rinse route 2 (two) times daily.  . Cholecalciferol (VITAMIN D3) 50000 units TABS Give 5 tablets gastric tube once a day  . docusate (COLACE) 50 MG/5ML liquid Place 10 mLs (100 mg total) into feeding tube 2 (two) times daily as needed for mild constipation.  . feeding supplement, ENSURE ENLIVE, (ENSURE ENLIVE) LIQD Take 237 mLs by mouth 2 (two) times daily between meals.  . gabapentin (NEURONTIN) 300 MG capsule Take 1 capsule (300 mg total) by mouth at bedtime.  . insulin detemir (LEVEMIR) 100 UNIT/ML injection Inject  0.34 mLs (34 Units total) into the skin daily.  . metoprolol tartrate (LOPRESSOR) 25 MG tablet Place 0.5 tablets (12.5 mg total) into feeding tube 2 (two) times daily.  . mirtazapine (REMERON) 15 MG tablet Take 7.5 mg by mouth at bedtime.  . Nutritional Supplements (FEEDING SUPPLEMENT, GLUCERNA 1.2 CAL,) LIQD Place 300 mLs into feeding tube 4 (four) times daily.  . pantoprazole sodium (PROTONIX) 40 mg/20 mL PACK Place 20 mLs (40 mg total) into feeding tube daily.  . polyethylene glycol (MIRALAX / GLYCOLAX) packet Place 17 g into feeding tube daily as needed.  . sulfamethoxazole-trimethoprim (BACTRIM DS,SEPTRA DS) 800-160 MG tablet Take 1 tablet by mouth every 12 (twelve) hours  for 9 days.  . Water For Irrigation, Sterile (FREE WATER) SOLN Place 300 mLs into feeding tube every 4 (four) hours. Keep HOB elevated at 45 degrees at all times during feedings, flushings, and medications 12a, 4a, 8a, 12p, 4p,8p  . zoledronic acid (RECLAST) 5 MG/100ML SOLN injection Inject 5 mg into the vein once.   No facility-administered encounter medications on file as of 10/04/2017.    ALLERGIES: Allergies  Allergen Reactions  . Penicillins Hives    VACCINATION STATUS:  There is no immunization history on file for this patient.  HPI Joe Wilkinson is 57 y.o. male who presents today with a medical history as above. he is being seen in follow-up for hypercalcemia.  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 - He is a poor historian. He is accompanying nursing home worker does not know the details of his medical history.  He was found to have mild to moderate hypercalcemia from September 2018. -He is status post Reclast infusion to treat immobilization hypercalcemia on September 22, 2017.  Subsequent labs show calcium dropping to 8.1, most recent is 8.7.  Prior to treatment his calcium was 11.5 associated with low PTH of 11.  He is on ongoing vitamin D supplement.  Highest calcium he had was 12.6 mg/dL. - He has diabetes on insulin. -No new complaints today.  Review of Systems  Constitutional: Reports steady weight gain, no fatigue, no subjective hyperthermia, no subjective hypothermia, + wheelchair bound. Eyes: no blurry vision, no xerophthalmia ENT: no sore throat, no nodules palpated in throat, no dysphagia/odynophagia, no hoarseness Cardiovascular: no Chest Pain, no Shortness of Breath, no palpitations, no leg swelling Respiratory: no cough, no SOB Gastrointestinal: no Nausea/Vomiting/Diarhhea Musculoskeletal:  Contracted left upper and lower extremities. + wheelchair bound. Skin: no rashes Neurological: He is  reported to be incontinent of bladder.   psychiatric: no depression, no anxiety  Objective:    BP 133/71   Pulse 81   Wt Readings from Last 3 Encounters:  09/30/17 189 lb 2.5 oz (85.8 kg)  09/04/17 171 lb 9.6 oz (77.8 kg)  08/30/17 171 lb 9.6 oz (77.8 kg)    Physical Exam  Constitutional:  wheelchair-bound with left hemiparesis.  Eyes: PERRLA, EOMI, no exophthalmos ENT: moist mucous membranes, no thyromegaly, no cervical lymphadenopathy, significant skull deficit on right frontoparietal region.  Cardiovascular: normal precordial activity, Regular Rate and Rhythm, no Murmur/Rubs/Gallops Respiratory:  adequate breathing efforts, no gross chest deformity, Clear to auscultation bilaterally Gastrointestinal: abdomen soft, Non -tender, No distension, Bowel Sounds present Musculoskeletal:   contracted left upper and lower extremities. Skin: moist, warm, no rashes Neurological:  motor power 0/5 on left side, 3-4 out of 5 on the right side.    CMP  PTH from 07/11/2017 was  22, associated with calcium of 12.6. Results for TEVEN, MITTMAN (MRN [id number]) as of 10/04/2017 13:34  Ref. Range 10/02/2017 80:88  BASIC METABOLIC PANEL Unknown Rpt (A)  Sodium Latest Ref Range: 135 - 145 mmol/L 138  Potassium Latest Ref Range: 3.5 - 5.1 mmol/L 3.9  Chloride Latest Ref Range: 101 - 111 mmol/L 108  CO2 Latest Ref Range: 22 - 32 mmol/L 22  Glucose Latest Ref Range: 65 - 99 mg/dL 135 (H)  BUN Latest Ref Range: 6 - 20 mg/dL 12  Creatinine Latest Ref Range: 0.61 - 1.24 mg/dL 0.70  Calcium Latest Ref Range: 8.9 - 10.3 mg/dL 8.7 (L)  Anion gap Latest Ref Range: 5 - 15  8  GFR, Est Non African American Latest Ref Range: >60 mL/min >60  GFR, Est African American Latest Ref Range: >60 mL/min >60   Diabetic Labs (most recent): Lab Results  Component Value Date   HGBA1C 5.8 (H) 07/11/2017   HGBA1C 14.3 (H) 04/02/2017     Lipid Panel ( most recent) Lipid Panel     Component Value Date/Time   CHOL 93  04/02/2017 0607   TRIG 135 04/02/2017 0607   HDL 28 (L) 04/02/2017 0607   CHOLHDL 3.3 04/02/2017 0607   VLDL 27 04/02/2017 0607   LDLCALC 38 04/02/2017 0607      Lab Results  Component Value Date   TSH 0.913 07/17/2017     Assessment & Plan:   1. 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  -He is status post Reclast infusion with subsequent improvement of calcium to 8.1--8.7 mg/dL. -He will be initiated on low-dose calcium supplement with calcium carbonate 500 mg daily with breakfast. -He will be continued on  vitamin D supplement, with vitamin D3 5000 units daily.  - I advised patient to maintain close follow up with Virgie Dad, MD for primary care needs. Follow up plan: Return in about 4 weeks (around 11/01/2017) for follow up with pre-visit labs.   Glade Lloyd, MD [facility] [address] [place], [place] [address] Phone: [phone]  Fax: [phone]     10/04/2017, 1:29 PM  This note was partially dictated with voice recognition software. Similar sounding words can be transcribed inadequately or may not  be corrected upon review.

## 2017-10-06 NOTE — Progress Notes (Signed)
[facility]  Saw patient at [facility]. 3/4 drains remain in place. Has been having low grade fevers to 100.1. Otherwise well.   Stool in the area of the drains again, patient essentially incontinent.   2 additional drains removed. Area less indurated, less tender. 1 drain remaining in place.  May fall out on its own. If it does that is ok.  Will see again after 3/13.  Curlene Labrum, MD [facility] [address] [place], [place] [id number] [phone] (office)

## 2017-10-09 ENCOUNTER — Other Ambulatory Visit: Payer: Self-pay

## 2017-10-09 MED ORDER — TRAMADOL HCL 50 MG PO TABS: 50.0000 mg | ORAL_TABLET | Freq: Four times a day (QID) | ORAL | 0 refills | Status: DC | PRN

## 2017-10-09 NOTE — Progress Notes (Signed)
[facility]  PATIENT: Joe Wilkinson DOB: 21-Mar-1961   REASON FOR VISIT: Follow-up for history of stroke August 2018 HISTORY FROM: Patient and CNA from [facility]    HISTORY OF PRESENT ILLNESS:  07/11/17 Joe Wilkinson is a 65 year african [place] male seen today for first office follow-up visit following hospital admission for stroke in August 2018.  He is accompanied by [facility] center nursing home attendant where he stays. No family is available. I have personally reviewed the electronic medical records and imaging films. Joe Wilkinson an 57 y.o.malewho presented to [facility] in DKA. He was found lethargic at a bus stop and then became unresponsive. EMS reported O2 sat of 80% and BP of 90/40. His CBG revealed hyperglycemia. On arrival to [facility] the patient patient could tell the examiner his name but was unable to provide any other information. He was restless, moaning and moving all extremities, not following commands. Labs were consistent with DKA and he was started on insulin drip. Initial CT head at [facility] showed no acute abnormality. He developed Kussmaul breathing requiring intubation. 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 He was transported to [facility] for further care. He   developed hypotension requiring pressors.  He was taken for an emergent right hemi-craniectomy for malignant cerebral edema by Dr. Cyndy Freeze and had a prolonged neuro ICU stay which was complicated by acute respiratory failure with hypoxia, hypercarbia, septic shock secondary to Streptococcus agalactiae  pneumonia.  He also developed new onset atrial fibrillation with rapid ventricular rate.  E. coli and Klebsiella urinary tract infection.  Perianal cellulitis with small abscess.  He had a prolonged ICU stay and was eventually weaned  off ventilatory support.  Follow-up CT scan showed significant right hemispheric edema with herniation out outside craniotomy bone defect.  He underwent PEG tube placement by trauma team.  He was discharged on 05/29/17 to pain center for rehabilitation.  He is mental status has improved and he can speak and communicate but has persistent spastic dense hemiplegia.  He develops intermittent confusion.  He is still has persistent right-sided scalp swelling and has been seen in neurosurgery follow-up clinic on 06/21/17 and the decision to do cranioplasty has been postponed for a few more months until the swelling subsides.  He can eat a dysphagia 3 diet but is still getting mostly PEG tube feeds.  Patient has had a few falls.  He remains full support and requires 2 person support to stand and take a few steps.  He is getting ongoing physical occupational and speech therapy. UPDATE 3/5/2019[name], 56 year old male returns for follow-up with history of stroke in August 2018.  He remains on Eliquis for secondary stroke prevention and atrial fibrillation.  He was readmitted to the hospital on 09/24/2017 for sepsis and perianal abscess.  His PEG tube was dislodged.  He continues to have hemiparesis affecting the left side from his stroke event.  He continues to have a persistent right-sided scalp swelling and cranioplasty has been postponed until the swelling subsides.  He remains on a dysphagia diet and gets PEG feedings at night. Patient is wheelchair-bound.  His physical therapy was stopped because patient was not making progress.  No recent falls.  He is dependent for all activities of daily living.  He returns for reevaluation  REVIEW OF SYSTEMS: Full 14  system review of systems performed and notable only for those listed, all others are neg:  Constitutional: neg  Cardiovascular: neg Ear/Nose/Throat: neg  Skin: Open wounds to buttocks Eyes: neg Respiratory: neg Gastroitestinal: neg    Hematology/Lymphatic: neg  Endocrine: neg Musculoskeletal: wheelchair-bound Allergy/Immunology: neg Neurological: neg Psychiatric: neg Sleep : neg   ALLERGIES: Allergies  Allergen Reactions  . Penicillins Hives    HOME MEDICATIONS: Outpatient Medications Prior to Visit  Medication Sig Dispense Refill  . acetaminophen (TYLENOL) 325 MG tablet Place 650 mg into feeding tube every 4 (four) hours as needed.    . Amino Acids-Protein Hydrolys (FEEDING SUPPLEMENT, PRO-STAT SUGAR FREE 64,) LIQD Place 30 mLs into feeding tube daily. Per gastric tube     . apixaban (ELIQUIS) 5 MG TABS tablet Place 1 tablet (5 mg total) into feeding tube 2 (two) times daily. 60 tablet   . bisacodyl (DULCOLAX) 10 MG suppository Place 1 suppository (10 mg total) rectally daily as needed for moderate constipation. 12 suppository 0  . calcium carbonate (TUMS) 500 MG chewable tablet Chew 1 tablet (200 mg of elemental calcium total) by mouth daily. 30 tablet 1  . chlorhexidine (PERIDEX) 0.12 % solution 15 mLs by Mouth Rinse route 2 (two) times daily. 120 mL 0  . Cholecalciferol (VITAMIN D3) 50000 units TABS Give 5 tablets gastric tube once a day    . docusate (COLACE) 50 MG/5ML liquid Place 10 mLs (100 mg total) into feeding tube 2 (two) times daily as needed for mild constipation. 100 mL 0  . feeding supplement, ENSURE ENLIVE, (ENSURE ENLIVE) LIQD Take 237 mLs by mouth 2 (two) times daily between meals. 237 mL 12  . gabapentin (NEURONTIN) 300 MG capsule Take 1 capsule (300 mg total) by mouth at bedtime.    . insulin detemir (LEVEMIR) 100 UNIT/ML injection Inject 0.34 mLs (34 Units total) into the skin daily. 10 mL 11  . metoprolol tartrate (LOPRESSOR) 25 MG tablet Place 0.5 tablets (12.5 mg total) into feeding tube 2 (two) times daily.    . mirtazapine (REMERON) 15 MG tablet Take 7.5 mg by mouth at bedtime.    . Nutritional Supplements (FEEDING SUPPLEMENT, GLUCERNA 1.2 CAL,) LIQD Place 300 mLs into feeding tube 4  (four) times daily.    . pantoprazole sodium (PROTONIX) 40 mg/20 mL PACK Place 20 mLs (40 mg total) into feeding tube daily. 30 each   . polyethylene glycol (MIRALAX / GLYCOLAX) packet Place 17 g into feeding tube daily as needed. 14 each 0  . traMADol (ULTRAM) 50 MG tablet Take 1 tablet (50 mg total) by mouth every 6 (six) hours as needed. 30 tablet 0  . Water For Irrigation, Sterile (FREE WATER) SOLN Place 300 mLs into feeding tube every 4 (four) hours. Keep HOB elevated at 45 degrees at all times during feedings, flushings, and medications 12a, 4a, 8a, 12p, 4p,8p    . zoledronic acid (RECLAST) 5 MG/100ML SOLN injection Inject 5 mg into the vein once.     No facility-administered medications prior to visit.     PAST MEDICAL HISTORY: Past Medical History:  Diagnosis Date  . DM (diabetes mellitus) ([place])   . Hypertension   . Stroke [facility])     PAST SURGICAL HISTORY: Past Surgical History:  Procedure Laterality Date  . CRANIECTOMY Right 04/01/2017   Procedure: RIGHT DECOMPRESSIVE CRANIECTOMY;  Surgeon: Ditty, Kevan Ny, MD;  Location: [place];  Service: Neurosurgery;  Laterality: Right;  . ESOPHAGOGASTRODUODENOSCOPY N/A 04/13/2017   Procedure: ESOPHAGOGASTRODUODENOSCOPY (EGD);  Surgeon: Georganna Skeans, MD;  Location: [place];  Service: General;  Laterality: N/A;  bedside  . INCISION AND DRAINAGE PERIRECTAL ABSCESS N/A 09/28/2017   Procedure: IRRIGATION AND DEBRIDEMENT PERIANAL ABSCESS;  Surgeon: Virl Cagey, MD;  Location: [facility];  Service: General;  Laterality: N/A;  . PEG PLACEMENT N/A 04/13/2017   Procedure: PERCUTANEOUS ENDOSCOPIC GASTROSTOMY (PEG) PLACEMENT;  Surgeon: Georganna Skeans, MD;  Location: [place];  Service: General;  Laterality: N/A;    FAMILY HISTORY: No family history on file.  SOCIAL HISTORY: Social History   Socioeconomic History  . Marital status: Unknown    Spouse name: Not on file  . Number of children: Not on file  . Years of education: Not  on file  . Highest education level: Not on file  Social Needs  . Financial resource strain: Not on file  . Food insecurity - worry: Not on file  . Food insecurity - inability: Not on file  . Transportation needs - medical: Not on file  . Transportation needs - non-medical: Not on file  Occupational History  . Not on file  Tobacco Use  . Smoking status: Former Smoker    Types: Cigarettes  . Smokeless tobacco: Never Used  . Tobacco comment: UTA  Substance and Sexual Activity  . Alcohol use: No    Comment: UTA  . Drug use: No    Comment: UTA  . Sexual activity: Not Currently    Birth control/protection: None  Other Topics Concern  . Not on file  Social History Narrative  . Not on file     PHYSICAL EXAM  Vitals:   10/10/17 1037  BP: 103/65  Pulse: 96  Weight: 163 lb (73.9 kg)   Body mass index is 26.31 kg/m.  Generalized: Well developed, in no acute distress  Head: normocephalic and atraumatic,. Oropharynx benign  Neck: Supple, no carotid bruits  Cardiac: Regular rate rhythm, no murmur  Musculoskeletal: No deformity   Neurological examination   Mentation: Alert oriented to time, place, history taking. Attention span and concentration appropriate. Recent and remote memory intact.  Follows all commands, mild dysarthria.   Cranial nerve II-XII: Fundoscopic exam reveals sharp disc margins.Pupils were equal round reactive to light extraocular movements were full, visual field with dense left homonymous hemianopia,  on confrontational test.  Mild left lower facial weakness  hearing was intact to finger rubbing bilaterally. Uvula tongue midline. head turning and shoulder shrug were normal and symmetric.Tongue protrusion into cheek strength was normal. Motor: normal bulk and tone, full strength in the BUE, BLE, on the right dense spastic left hemiplegia 0 out of 5 with flexion contractures in the left hand spasticity left upper and lower extremity  Sensory: normal and  symmetric to light touch, pinprick, and  Vibration in the upper and lower extremities  Coordination: finger-nose-finger, heel-to-shin bilaterally, performed accurately on the right unable on the left  Reflexes: 2+ on the right brisker on the left plantar responses were downgoing on the right and upgoing on the left  Gait and Station: [employer] DIAGNOSTIC DATA (LABS, IMAGING, TESTING) - I reviewed patient records, labs, notes, testing and imaging myself where available.  Lab Results  Component Value Date   WBC 8.5 10/10/2017   HGB 11.6 (L) 10/10/2017   HCT 40.0 10/10/2017   MCV 93.5 10/10/2017   PLT 443 (H) 10/10/2017      Component Value Date/Time   NA 133 (L) 10/10/2017 0714   K 4.7 10/10/2017 0714   CL 101  10/10/2017 0714   CO2 23 10/10/2017 0714   GLUCOSE 102 (H) 10/10/2017 0714   BUN 19 10/10/2017 0714   CREATININE 0.92 10/10/2017 0714   CALCIUM 9.5 10/10/2017 0714   CALCIUM 11.5 (H) 09/05/2017 0709   PROT 7.6 10/10/2017 0714   ALBUMIN 3.2 (L) 10/10/2017 0714   AST 22 10/10/2017 0714   ALT 46 10/10/2017 0714   ALKPHOS 53 10/10/2017 0714   BILITOT 0.3 10/10/2017 0714   GFRNONAA >60 10/10/2017 0714   GFRAA >60 10/10/2017 0714   Lab Results  Component Value Date   CHOL 93 04/02/2017   HDL 28 (L) 04/02/2017   LDLCALC 38 04/02/2017   TRIG 135 04/02/2017   CHOLHDL 3.3 04/02/2017   Lab Results  Component Value Date   HGBA1C 5.8 (H) 07/11/2017   Lab Results  Component Value Date   SHFWYOVZ85 885 04/04/2017   Lab Results  Component Value Date   TSH 0.913 07/17/2017      ASSESSMENT AND PLAN 32 year African-American male with 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  Hospital readmission on 09/24/2017 for sepsis and perianal abscess.The patient is a current patient of Dr.  Leonie Man  who is out of the office today . This note is sent to the work in doctor.       PLAN:Stressed the importance of management of risk factors to prevent further stroke Continue Eliquis for secondary stroke prevention and atrial fibrillation Maintain strict control of hypertension with blood pressure goal below 130/90, today's reading 103/65  continue antihypertensive medications Control of diabetes with hemoglobin A1c below 6.5 followed by primary care  continue diabetic medications Cholesterol with LDL cholesterol less than 70, followed by primary care,   Wheelchair-bound at risk for falls,  Continue dysphagia diet and PEG  feedings at night  Physical therapy has been stopped for now Follow-up with neurosurgery for cranioplasty of the right skull defect  Follow-up in 6 months I spent 25 minutes in total face to face time with the patient/ CMA more than 50% of which was spent counseling and coordination of care, reviewing test results reviewing medications and discussing and reviewing the diagnosis of  ,significant residual left hemiplegia, atrial fibrillation,, risk for recurrent stroke/TIAs.  Dennie Bible, [facility], [facility], APRN  [facility] [address], [place], [place] [address] [phone]

## 2017-10-09 NOTE — Telephone Encounter (Signed)
This encounter was created in error - please disregard.

## 2017-10-10 ENCOUNTER — Ambulatory Visit: Payer: Medicaid Other | Admitting: Nurse Practitioner

## 2017-10-10 ENCOUNTER — Encounter: Payer: Self-pay | Admitting: Nurse Practitioner

## 2017-10-10 ENCOUNTER — Encounter (HOSPITAL_COMMUNITY)
Admission: RE | Admit: 2017-10-10 | Discharge: 2017-10-10 | Disposition: A | Discharge status: Home / Self Care | Payer: Medicaid Other | Source: Skilled Nursing Facility | Attending: Internal Medicine | Admitting: Internal Medicine

## 2017-10-10 VITALS — BP 103/65 | HR 96 | Wt 163.0 lb

## 2017-10-10 DIAGNOSIS — Z5189 Encounter for other specified aftercare: Secondary | ICD-10-CM | POA: Insufficient documentation

## 2017-10-10 DIAGNOSIS — D72829 Elevated white blood cell count, unspecified: Secondary | ICD-10-CM | POA: Insufficient documentation

## 2017-10-10 DIAGNOSIS — I63411 Cerebral infarction due to embolism of right middle cerebral artery: Secondary | ICD-10-CM

## 2017-10-10 DIAGNOSIS — I10 Essential (primary) hypertension: Secondary | ICD-10-CM | POA: Insufficient documentation

## 2017-10-10 DIAGNOSIS — I69354 Hemiplegia and hemiparesis following cerebral infarction affecting left non-dominant side: Secondary | ICD-10-CM | POA: Diagnosis not present

## 2017-10-10 DIAGNOSIS — I480 Paroxysmal atrial fibrillation: Secondary | ICD-10-CM

## 2017-10-10 DIAGNOSIS — I63311 Cerebral infarction due to thrombosis of right middle cerebral artery: Secondary | ICD-10-CM | POA: Insufficient documentation

## 2017-10-10 DIAGNOSIS — Z978 Presence of other specified devices: Secondary | ICD-10-CM | POA: Insufficient documentation

## 2017-10-10 LAB — CBC WITH DIFFERENTIAL/PLATELET
BASOS ABS: 0.2 10*3/uL — AB (ref 0.0–0.1)
Basophils Relative: 2 %
EOS PCT: 4 %
Eosinophils Absolute: 0.4 10*3/uL (ref 0.0–0.7)
HCT: 40 % (ref 39.0–52.0)
HEMOGLOBIN: 11.6 g/dL — AB (ref 13.0–17.0)
LYMPHS PCT: 41 %
Lymphs Abs: 3.5 10*3/uL (ref 0.7–4.0)
MCH: 27.1 pg (ref 26.0–34.0)
MCHC: 29 g/dL — ABNORMAL LOW (ref 30.0–36.0)
MCV: 93.5 fL (ref 78.0–100.0)
Monocytes Absolute: 0.7 10*3/uL (ref 0.1–1.0)
Monocytes Relative: 8 %
NEUTROS PCT: 45 %
Neutro Abs: 3.8 10*3/uL (ref 1.7–7.7)
PLATELETS: 443 10*3/uL — AB (ref 150–400)
RBC: 4.28 MIL/uL (ref 4.22–5.81)
RDW: 16.6 % — ABNORMAL HIGH (ref 11.5–15.5)
WBC: 8.5 10*3/uL (ref 4.0–10.5)

## 2017-10-10 LAB — COMPREHENSIVE METABOLIC PANEL
ALT: 46 U/L (ref 17–63)
AST: 22 U/L (ref 15–41)
Albumin: 3.2 g/dL — ABNORMAL LOW (ref 3.5–5.0)
Alkaline Phosphatase: 53 U/L (ref 38–126)
Anion gap: 9 (ref 5–15)
BUN: 19 mg/dL (ref 6–20)
CO2: 23 mmol/L (ref 22–32)
CREATININE: 0.92 mg/dL (ref 0.61–1.24)
Calcium: 9.5 mg/dL (ref 8.9–10.3)
Chloride: 101 mmol/L (ref 101–111)
GFR calc Af Amer: >60 mL/min (ref >60)
GFR calc non Af Amer: >60 mL/min (ref >60)
Glucose, Bld: 102 mg/dL — ABNORMAL HIGH (ref 65–99)
Potassium: 4.7 mmol/L (ref 3.5–5.1)
SODIUM: 133 mmol/L — AB (ref 135–145)
Total Bilirubin: 0.3 mg/dL (ref 0.3–1.2)
Total Protein: 7.6 g/dL (ref 6.5–8.1)

## 2017-10-10 NOTE — Patient Instructions (Signed)
Per skilled nsg sheet [facility]

## 2017-10-10 NOTE — Progress Notes (Signed)
I reviewed note and agree with plan.   Penni Bombard, MD 11/09/1538, 0:86 PM Certified in Neurology, Neurophysiology and Neuroimaging  [facility] [address] [place], [place] [address] [phone]

## 2017-10-17 ENCOUNTER — Encounter (HOSPITAL_COMMUNITY)
Admission: RE | Admit: 2017-10-17 | Discharge: 2017-10-17 | Disposition: A | Discharge status: Home / Self Care | Payer: Medicaid Other | Source: Skilled Nursing Facility | Attending: Internal Medicine | Admitting: Internal Medicine

## 2017-10-17 DIAGNOSIS — R1310 Dysphagia, unspecified: Secondary | ICD-10-CM | POA: Insufficient documentation

## 2017-10-17 DIAGNOSIS — I63311 Cerebral infarction due to thrombosis of right middle cerebral artery: Secondary | ICD-10-CM | POA: Insufficient documentation

## 2017-10-17 DIAGNOSIS — I10 Essential (primary) hypertension: Secondary | ICD-10-CM | POA: Insufficient documentation

## 2017-10-17 DIAGNOSIS — Z978 Presence of other specified devices: Secondary | ICD-10-CM | POA: Insufficient documentation

## 2017-10-17 DIAGNOSIS — E119 Type 2 diabetes mellitus without complications: Secondary | ICD-10-CM | POA: Insufficient documentation

## 2017-10-17 DIAGNOSIS — I4891 Unspecified atrial fibrillation: Secondary | ICD-10-CM | POA: Insufficient documentation

## 2017-10-17 LAB — COMPREHENSIVE METABOLIC PANEL
ALBUMIN: 3.1 g/dL — AB (ref 3.5–5.0)
ALT: 35 U/L (ref 17–63)
ANION GAP: 9 (ref 5–15)
AST: 16 U/L (ref 15–41)
Alkaline Phosphatase: 49 U/L (ref 38–126)
BUN: 18 mg/dL (ref 6–20)
CHLORIDE: 106 mmol/L (ref 101–111)
CO2: 24 mmol/L (ref 22–32)
Calcium: 9.5 mg/dL (ref 8.9–10.3)
Creatinine, Ser: 0.57 mg/dL — ABNORMAL LOW (ref 0.61–1.24)
GFR calc Af Amer: >60 mL/min (ref >60)
GFR calc non Af Amer: >60 mL/min (ref >60)
GLUCOSE: 104 mg/dL — AB (ref 65–99)
POTASSIUM: 4.1 mmol/L (ref 3.5–5.1)
Sodium: 139 mmol/L (ref 135–145)
Total Bilirubin: 0.4 mg/dL (ref 0.3–1.2)
Total Protein: 6.8 g/dL (ref 6.5–8.1)

## 2017-10-19 ENCOUNTER — Telehealth: Payer: Self-pay | Admitting: General Surgery

## 2017-10-19 NOTE — Telephone Encounter (Signed)
Perianal area checked. Remaining drain has fallen out. No sutures in place. Area looks soft and without infection. Small areas of granulation where prior drains were in place, healing.  No additional surgical intervention needed.  Curlene Labrum, MD [facility] [address] [place], [place] [address] [phone] (office)

## 2017-10-26 ENCOUNTER — Non-Acute Institutional Stay (SKILLED_NURSING_FACILITY): Payer: Medicaid Other | Admitting: Internal Medicine

## 2017-10-26 ENCOUNTER — Encounter: Payer: Self-pay | Admitting: Internal Medicine

## 2017-10-26 DIAGNOSIS — E108 Type 1 diabetes mellitus with unspecified complications: Secondary | ICD-10-CM | POA: Diagnosis not present

## 2017-10-26 DIAGNOSIS — I69354 Hemiplegia and hemiparesis following cerebral infarction affecting left non-dominant side: Secondary | ICD-10-CM | POA: Diagnosis not present

## 2017-10-26 DIAGNOSIS — K610 Anal abscess: Secondary | ICD-10-CM | POA: Diagnosis not present

## 2017-10-26 DIAGNOSIS — I10 Essential (primary) hypertension: Secondary | ICD-10-CM | POA: Diagnosis not present

## 2017-10-26 DIAGNOSIS — I480 Paroxysmal atrial fibrillation: Secondary | ICD-10-CM | POA: Diagnosis not present

## 2017-10-26 DIAGNOSIS — Z931 Gastrostomy status: Secondary | ICD-10-CM | POA: Diagnosis not present

## 2017-10-27 ENCOUNTER — Encounter (HOSPITAL_COMMUNITY)
Admission: RE | Admit: 2017-10-27 | Discharge: 2017-10-27 | Disposition: A | Discharge status: Home / Self Care | Payer: Medicaid Other | Source: Skilled Nursing Facility | Attending: *Deleted | Admitting: *Deleted

## 2017-10-28 ENCOUNTER — Encounter (HOSPITAL_COMMUNITY)
Admission: RE | Admit: 2017-10-28 | Discharge: 2017-10-28 | Disposition: A | Discharge status: Home / Self Care | Payer: Medicaid Other | Source: Skilled Nursing Facility | Attending: *Deleted | Admitting: *Deleted

## 2017-10-30 ENCOUNTER — Encounter (HOSPITAL_COMMUNITY)
Admission: RE | Admit: 2017-10-30 | Discharge: 2017-10-30 | Disposition: A | Discharge status: Home / Self Care | Payer: Medicaid Other | Source: Skilled Nursing Facility | Attending: Internal Medicine | Admitting: Internal Medicine

## 2017-10-30 DIAGNOSIS — Z5189 Encounter for other specified aftercare: Secondary | ICD-10-CM | POA: Diagnosis present

## 2017-10-30 DIAGNOSIS — Z978 Presence of other specified devices: Secondary | ICD-10-CM | POA: Diagnosis present

## 2017-10-30 DIAGNOSIS — R1310 Dysphagia, unspecified: Secondary | ICD-10-CM | POA: Diagnosis not present

## 2017-10-30 DIAGNOSIS — I63311 Cerebral infarction due to thrombosis of right middle cerebral artery: Secondary | ICD-10-CM | POA: Diagnosis present

## 2017-10-30 DIAGNOSIS — I4891 Unspecified atrial fibrillation: Secondary | ICD-10-CM | POA: Diagnosis not present

## 2017-10-30 DIAGNOSIS — E119 Type 2 diabetes mellitus without complications: Secondary | ICD-10-CM | POA: Diagnosis not present

## 2017-10-30 DIAGNOSIS — I10 Essential (primary) hypertension: Secondary | ICD-10-CM | POA: Diagnosis not present

## 2017-10-31 LAB — HEPATITIS C ANTIBODY: HCV Ab: 0.3 s/co ratio (ref 0.0–0.9)

## 2017-11-07 LAB — HEPATITIS C ANTIBODY (REFLEX)

## 2017-11-08 ENCOUNTER — Encounter: Payer: Self-pay | Admitting: "Endocrinology

## 2017-11-08 ENCOUNTER — Ambulatory Visit (INDEPENDENT_AMBULATORY_CARE_PROVIDER_SITE_OTHER): Payer: Medicaid Other | Admitting: "Endocrinology

## 2017-11-08 MED ORDER — VITAMIN D 50 MCG (2000 UT) PO CAPS: 1.0000 | ORAL_CAPSULE | Freq: Every day | ORAL | 6 refills | Status: DC

## 2017-11-08 NOTE — Progress Notes (Signed)
Endocrinology follow-up note                                            11/08/2017, 3:25 PM   Subjective:    Patient ID: Joe Wilkinson, male    DOB: [birth year], PCP Virgie Dad, MD   Past Medical History:  Diagnosis Date  . DM (diabetes mellitus) ([place])   . Hypertension   . Stroke [facility])    Past Surgical History:  Procedure Laterality Date  . CRANIECTOMY Right 04/01/2017   Procedure: RIGHT DECOMPRESSIVE CRANIECTOMY;  Surgeon: Ditty, Kevan Ny, MD;  Location: [place];  Service: Neurosurgery;  Laterality: Right;  . ESOPHAGOGASTRODUODENOSCOPY N/A 04/13/2017   Procedure: ESOPHAGOGASTRODUODENOSCOPY (EGD);  Surgeon: Georganna Skeans, MD;  Location: [place];  Service: General;  Laterality: N/A;  bedside  . INCISION AND DRAINAGE PERIRECTAL ABSCESS N/A 09/28/2017   Procedure: IRRIGATION AND DEBRIDEMENT PERIANAL ABSCESS;  Surgeon: Virl Cagey, MD;  Location: [facility];  Service: General;  Laterality: N/A;  . PEG PLACEMENT N/A 04/13/2017   Procedure: PERCUTANEOUS ENDOSCOPIC GASTROSTOMY (PEG) PLACEMENT;  Surgeon: Georganna Skeans, MD;  Location: [facility] END[facility]COPY;  Service: General;  Laterality: N/A;   Social History   Socioeconomic History  . Marital status: Unknown    Spouse name: Not on file  . Number of children: Not on file  . Years of education: Not on file  . Highest education level: Not on file  Occupational History  . Not on file  Social Needs  . Financial resource strain: Not on file  . Food insecurity:    Worry: Not on file    Inability: Not on file  . Transportation needs:    Medical: Not on file    Non-medical: Not on file  Tobacco Use  . Smoking status: Former Smoker    Types: Cigarettes  . Smokeless tobacco: Never Used  . Tobacco comment: UTA  Substance and Sexual Activity  . Alcohol use: No    Comment: UTA  . Drug use: No    Comment: UTA  . Sexual activity: Not Currently    Birth control/protection: None  Lifestyle  . Physical activity:    Days  per week: Not on file    Minutes per session: Not on file  . Stress: Not on file  Relationships  . Social connections:    Talks on phone: Not on file    Gets together: Not on file    Attends religious service: Not on file    Active member of club or organization: Not on file    Attends meetings of clubs or organizations: Not on file    Relationship status: Not on file  Other Topics Concern  . Not on file  Social History Narrative  . Not on file   Outpatient Encounter Medications as of 11/08/2017  Medication Sig  . apixaban (ELIQUIS) 5 MG TABS tablet Place 1 tablet (5 mg total) into feeding tube 2 (two) times daily.  . bisacodyl (DULCOLAX) 10 MG suppository Place 1 suppository (10 mg total) rectally daily as needed for moderate constipation.  . docusate (COLACE) 50 MG/5ML liquid Place 10 mLs (100 mg total) into feeding tube 2 (two) times daily as needed for mild constipation.  . insulin detemir (LEVEMIR) 100 UNIT/ML injection Inject 0.34 mLs (34 Units total) into the skin daily. (Patient taking differently: Inject 30 Units  into the skin daily. )  . metoprolol tartrate (LOPRESSOR) 25 MG tablet Place 0.5 tablets (12.5 mg total) into feeding tube 2 (two) times daily.  . mirtazapine (REMERON) 15 MG tablet Take 7.5 mg by mouth at bedtime.  . pantoprazole sodium (PROTONIX) 40 mg/20 mL PACK Place 20 mLs (40 mg total) into feeding tube daily.  . polyethylene glycol (MIRALAX / GLYCOLAX) packet Place 17 g into feeding tube daily as needed.  . traMADol (ULTRAM) 50 MG tablet Take 1 tablet (50 mg total) by mouth every 6 (six) hours as needed.  . [DISCONTINUED] calcium carbonate (TUMS) 500 MG chewable tablet Chew 1 tablet (200 mg of elemental calcium total) by mouth daily.  . [DISCONTINUED] Cholecalciferol (VITAMIN D3) 50000 units TABS Give 5 tablets gastric tube once a day  . acetaminophen (TYLENOL) 325 MG tablet Place 650 mg into feeding tube every 4 (four) hours as needed.  . Amino Acids-Protein  Hydrolys (FEEDING SUPPLEMENT, PRO-STAT SUGAR FREE 64,) LIQD Place 30 mLs into feeding tube daily. Per gastric tube   . chlorhexidine (PERIDEX) 0.12 % solution 15 mLs by Mouth Rinse route 2 (two) times daily.  . Cholecalciferol (VITAMIN D) 2000 units CAPS Take 1 capsule (2,000 Units total) by mouth daily.  Marland Kitchen gabapentin (NEURONTIN) 300 MG capsule Take 1 capsule (300 mg total) by mouth at bedtime.  . Nutritional Supplements (FEEDING SUPPLEMENT, GLUCERNA 1.2 CAL,) LIQD Place 300 mLs into feeding tube 4 (four) times daily.  . Water For Irrigation, Sterile (FREE WATER) SOLN Place 300 mLs into feeding tube every 4 (four) hours. Keep HOB elevated at 45 degrees at all times during feedings, flushings, and medications 12a, 4a, 8a, 12p, 4p,8p  . [DISCONTINUED] zoledronic acid (RECLAST) 5 MG/100ML SOLN injection Inject 5 mg into the vein once.   No facility-administered encounter medications on file as of 11/08/2017.    ALLERGIES: Allergies  Allergen Reactions  . Penicillins Hives    VACCINATION STATUS:  There is no immunization history on file for this patient.  HPI Joe Wilkinson is 57 y.o. male who presents today with a medical history as above. he is being seen in follow-up for hypercalcemia.  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 - He is a poor historian. He is accompanying nursing home worker who  does not know the details of his medical history. -He was first seen for immobilization hypercalcemia, highest being 12.6 mg/dL.  He is status post Reclast infusion one time with proper expected clinical response subsequently.   -His most recent labs show calcium of 9.5.  - He has diabetes on insulin. -No new complaints today.  Review of Systems  Constitutional: +Wheelchair-bound,  no subjective hyperthermia, no subjective hypothermia, + wheelchair bound. Eyes: no blurry vision, no xerophthalmia ENT: no sore throat, no nodules  palpated in throat, no dysphagia/odynophagia, no hoarseness Cardiovascular: no Chest Pain, no Shortness of Breath, no palpitations, no leg swelling Respiratory: no cough, no SOB Gastrointestinal: no Nausea/Vomiting/Diarhhea Musculoskeletal:  Contracted left upper and lower extremities. + wheelchair bound. Skin: no rashes Neurological: He is reported to be incontinent of bladder.   psychiatric: no depression, no anxiety  Objective:    BP 100/69   Wt Readings from Last 3 Encounters:  10/26/17 164 lb 6.4 oz (74.6 kg)  10/10/17 163 lb (73.9 kg)  09/30/17 189 lb 2.5 oz (85.8 kg)    Physical Exam  Constitutional:  wheelchair-bound with left hemiparesis.  Eyes: PERRLA, EOMI, no exophthalmos  ENT: moist mucous membranes, no thyromegaly, no cervical lymphadenopathy, significant skull deficit on right frontoparietal region.  Musculoskeletal:   contracted left upper and lower extremities. Skin: moist, warm, no rashes Neurological:  motor power 0/5 on left side, 3-4 out of 5 on the right side.  Recent Results (from the past 2160 hour(s))  Basic metabolic panel     Status: Abnormal   Collection Time: 09/04/17  4:30 AM  Result Value Ref Range   Sodium 140 135 - 145 mmol/L   Potassium 4.0 3.5 - 5.1 mmol/L   Chloride 107 101 - 111 mmol/L   CO2 24 22 - 32 mmol/L   Glucose, Bld 109 (H) 65 - 99 mg/dL   BUN 21 (H) 6 - 20 mg/dL   Creatinine, Ser 0.76 0.61 - 1.24 mg/dL   Calcium 11.0 (H) 8.9 - 10.3 mg/dL   GFR calc non Af Amer >60 >60 mL/min   GFR calc Af Amer >60 >60 mL/min    Comment: (NOTE) The eGFR has been calculated using the CKD EPI equation. This calculation has not been validated in all clinical situations. eGFR's persistently <60 mL/min signify possible Chronic Kidney Disease.    Anion gap 9 5 - 15  PTH-related peptide     Status: None   Collection Time: 09/05/17  7:00 AM  Result Value Ref Range   PTH-related peptide <2.0 pmol/L    Comment: (NOTE) Reference Range: All Ages:  <2.0 The PTHrP assay should not be used to exclude cancer or screen tumor patients for humoral hypercalcemia of malignancy (HHM). The results should always be assessed in conjunction with the patient's medical history, clinical examination, and other findings. If test results are clinically discordant, please contact the laboratory. Performed At: [place], [place] [id number] Pepkowitz Sheral Apley MD [id number] Performed at [facility], [address] [place], [place] [address]   Phosphorus     Status: None   Collection Time: 09/05/17  7:09 AM  Result Value Ref Range   Phosphorus 3.7 2.5 - 4.6 mg/dL  Magnesium     Status: None   Collection Time: 09/05/17  7:09 AM  Result Value Ref Range   Magnesium 2.1 1.7 - 2.4 mg/dL  PTH, intact and calcium     Status: Abnormal   Collection Time: 09/05/17  7:09 AM  Result Value Ref Range   PTH 11 (L) 15 - 65 pg/mL   Calcium, Total (PTH) 11.5 (H) 8.7 - 10.2 mg/dL   PTH Interp Comment     Comment: (NOTE) Interpretation                 Intact PTH    Calcium                                (pg/mL)      (mg/dL) Normal                          15 - 65     8.6 - 10.2 Primary Hyperparathyroidism         >65          >10.2 Secondary Hyperparathyroidism       >65          <10.2 Non-Parathyroid Hypercalcemia       <65          >10.2 Hypoparathyroidism                  <  15          < 8.6 Non-Parathyroid Hypocalcemia    15 - 65          < 8.6 Performed At: [facility] [place], [place] [id number] Rush Farmer MD [id number]   VITAMIN D 25 Hydroxy (Vit-D Deficiency, Fractures)     Status: Abnormal   Collection Time: 09/05/17  7:09 AM  Result Value Ref Range   Vit D, 25-Hydroxy 19.0 (L) 30.0 - 100.0 ng/mL    Comment: (NOTE) Vitamin D deficiency has been defined by the Institute of Medicine and an Endocrine Society practice guideline as a level of serum 25-OH vitamin D less than 20 ng/mL  (1,2). The Endocrine Society went on to further define vitamin D insufficiency as a level between 21 and 29 ng/mL (2). 1. IOM (Institute of Medicine). 2010. Dietary reference   intakes for calcium and D. Waves: The   [employer]. 2. Holick MF, Binkley [place], Bischoff-Ferrari HA, et al.   Evaluation, treatment, and prevention of vitamin D   deficiency: an Endocrine Society clinical practice   guideline. JCEM. 2011 Jul; 96(7):1911-30. Performed At: [facility] [place], [place] [id number] Rush Farmer MD [id number]   Influenza panel by PCR (type A & B)     Status: None   Collection Time: 09/24/17  3:15 PM  Result Value Ref Range   Influenza A By PCR NEGATIVE NEGATIVE   Influenza B By PCR NEGATIVE NEGATIVE    Comment: (NOTE) The Xpert Xpress Flu assay is intended as an aid in the diagnosis of  influenza and should not be used as a sole basis for treatment.  This  assay is FDA approved for nasopharyngeal swab specimens only. Nasal  washings and aspirates are unacceptable for Xpert Xpress Flu testing. Performed at [facility], [address], [place], [place] [address]   Comprehensive metabolic panel     Status: Abnormal   Collection Time: 09/24/17  3:27 PM  Result Value Ref Range   Sodium 140 135 - 145 mmol/L   Potassium 4.2 3.5 - 5.1 mmol/L   Chloride 103 101 - 111 mmol/L   CO2 26 22 - 32 mmol/L   Glucose, Bld 141 (H) 65 - 99 mg/dL   BUN 24 (H) 6 - 20 mg/dL   Creatinine, Ser 0.90 0.61 - 1.24 mg/dL   Calcium 9.2 8.9 - 10.3 mg/dL   Total Protein 8.3 (H) 6.5 - 8.1 g/dL   Albumin 2.6 (L) 3.5 - 5.0 g/dL   AST 34 15 - 41 U/L   ALT 59 17 - 63 U/L   Alkaline Phosphatase 102 38 - 126 U/L   Total Bilirubin 1.0 0.3 - 1.2 mg/dL   GFR calc non Af Amer >60 >60 mL/min   GFR calc Af Amer >60 >60 mL/min    Comment: (NOTE) The eGFR has been calculated using the CKD EPI equation. This calculation has not been validated in all clinical  situations. eGFR's persistently <60 mL/min signify possible Chronic Kidney Disease.    Anion gap 11 5 - 15    Comment: Performed at [facility], [address] [place], [place] [address]  CBC with Differential     Status: Abnormal   Collection Time: 09/24/17  3:27 PM  Result Value Ref Range   WBC 23.8 (H) 4.0 - 10.5 K/uL   RBC 4.29 4.22 - 5.81 MIL/uL   Hemoglobin 11.9 (L) 13.0 - 17.0 g/dL   HCT 38.9 (  L) 39.0 - 52.0 %   MCV 90.7 78.0 - 100.0 fL   MCH 27.7 26.0 - 34.0 pg   MCHC 30.6 30.0 - 36.0 g/dL   RDW 15.1 11.5 - 15.5 %   Platelets 271 150 - 400 K/uL   Neutrophils Relative % 80 %   Neutro Abs 19.0 (H) 1.7 - 7.7 K/uL   Lymphocytes Relative 6 %   Lymphs Abs 1.4 0.7 - 4.0 K/uL   Monocytes Relative 13 %   Monocytes Absolute 3.2 (H) 0.1 - 1.0 K/uL   Eosinophils Relative 1 %   Eosinophils Absolute 0.2 0.0 - 0.7 K/uL   Basophils Relative 0 %   Basophils Absolute 0.1 0.0 - 0.1 K/uL    Comment: Performed at [facility], [address], [place], [place] [address]  Protime-INR     Status: Abnormal   Collection Time: 09/24/17  3:27 PM  Result Value Ref Range   Prothrombin Time 16.2 (H) 11.4 - 15.2 seconds   INR 1.31     Comment: Performed at [facility], [address], [place], [place] [address]  Phosphorus     Status: Abnormal   Collection Time: 09/24/17  3:27 PM  Result Value Ref Range   Phosphorus 2.2 (L) 2.5 - 4.6 mg/dL    Comment: Performed at [facility], [address] [place], [place] [address]  Lipase, blood     Status: None   Collection Time: 09/24/17  3:27 PM  Result Value Ref Range   Lipase 35 11 - 51 U/L    Comment: Performed at [facility], [address], [place], [place] [address]  Culture, blood (Routine x 2)     Status: None   Collection Time: 09/24/17  3:29 PM  Result Value Ref Range   Specimen Description      BLOOD LEFT ANTECUBITAL Blood Culture adequate volume BOTTLES DRAWN AEROBIC ONLY   Special Requests NONE    Culture      NO GROWTH 5  DAYS Performed at [facility], [address] [place], [place] [address]    Report Status 09/29/2017 FINAL   I-Stat CG4 Lactic Acid, ED     Status: None   Collection Time: 09/24/17  3:43 PM  Result Value Ref Range   Lactic Acid, Venous 1.55 0.5 - 1.9 mmol/L  Urinalysis, Routine w reflex microscopic     Status: Abnormal   Collection Time: 09/24/17  5:49 PM  Result Value Ref Range   Color, Urine YELLOW YELLOW   APPearance CLEAR CLEAR   Specific Gravity, Urine 1.020 1.005 - 1.030   pH 6.0 5.0 - 8.0   Glucose, UA NEGATIVE NEGATIVE mg/dL   Hgb urine dipstick NEGATIVE NEGATIVE   Bilirubin Urine NEGATIVE NEGATIVE   Ketones, ur NEGATIVE NEGATIVE mg/dL   Protein, ur >=300 (A) NEGATIVE mg/dL   Nitrite NEGATIVE NEGATIVE   Leukocytes, UA NEGATIVE NEGATIVE   RBC / HPF 6-30 0 - 5 RBC/hpf   WBC, UA 0-5 0 - 5 WBC/hpf   Bacteria, UA NONE SEEN NONE SEEN   Squamous Epithelial / LPF 0-5 (A) NONE SEEN   Mucus PRESENT     Comment: Performed at [facility], [address], [place],  [address]  CBG monitoring, ED     Status: None   Collection Time: 09/24/17  8:43 PM  Result Value Ref Range   Glucose-Capillary 95 65 - 99 mg/dL  CBG monitoring, ED     Status: Abnormal   Collection Time: 09/24/17  9:29  PM  Result Value Ref Range   Glucose-Capillary 107 (H) 65 - 99 mg/dL  MRSA PCR Screening     Status: None   Collection Time: 09/24/17 11:30 PM  Result Value Ref Range   MRSA by PCR NEGATIVE NEGATIVE    Comment:        The GeneXpert MRSA Assay (FDA approved for NASAL specimens only), is one component of a comprehensive MRSA colonization surveillance program. It is not intended to diagnose MRSA infection nor to guide or monitor treatment for MRSA infections. Performed at [facility], [address], [place], [place] [address]   Glucose, capillary     Status: None   Collection Time: 09/25/17 12:26 AM  Result Value Ref Range   Glucose-Capillary 96 65 - 99 mg/dL   Comment 1 Notify RN    Glucose, capillary     Status: Abnormal   Collection Time: 09/25/17  4:13 AM  Result Value Ref Range   Glucose-Capillary 120 (H) 65 - 99 mg/dL   Comment 1 Notify RN   Basic metabolic panel     Status: Abnormal   Collection Time: 09/25/17  5:31 AM  Result Value Ref Range   Sodium 141 135 - 145 mmol/L   Potassium 3.4 (L) 3.5 - 5.1 mmol/L    Comment: DELTA CHECK NOTED   Chloride 112 (H) 101 - 111 mmol/L   CO2 19 (L) 22 - 32 mmol/L   Glucose, Bld 146 (H) 65 - 99 mg/dL   BUN 13 6 - 20 mg/dL   Creatinine, Ser 0.69 0.61 - 1.24 mg/dL   Calcium 8.0 (L) 8.9 - 10.3 mg/dL   GFR calc non Af Amer >60 >60 mL/min   GFR calc Af Amer >60 >60 mL/min    Comment: (NOTE) The eGFR has been calculated using the CKD EPI equation. This calculation has not been validated in all clinical situations. eGFR's persistently <60 mL/min signify possible Chronic Kidney Disease.    Anion gap 10 5 - 15    Comment: Performed at [facility], [address], [place], [place] [address]  CBC     Status: Abnormal   Collection Time: 09/25/17  5:31 AM  Result Value Ref Range   WBC 25.0 (H) 4.0 - 10.5 K/uL   RBC 3.69 (L) 4.22 - 5.81 MIL/uL   Hemoglobin 10.1 (L) 13.0 - 17.0 g/dL   HCT 33.7 (L) 39.0 - 52.0 %   MCV 91.3 78.0 - 100.0 fL   MCH 27.4 26.0 - 34.0 pg   MCHC 30.0 30.0 - 36.0 g/dL   RDW 15.4 11.5 - 15.5 %   Platelets 290 150 - 400 K/uL    Comment: Performed at [facility], [address], [place], [place] [address]  Magnesium     Status: None   Collection Time: 09/25/17  5:45 AM  Result Value Ref Range   Magnesium 2.1 1.7 - 2.4 mg/dL    Comment: Performed at [facility], [address] [place], [place] [address]  Glucose, capillary     Status: Abnormal   Collection Time: 09/25/17  7:57 AM  Result Value Ref Range   Glucose-Capillary 157 (H) 65 - 99 mg/dL  Glucose, capillary     Status: Abnormal   Collection Time: 09/25/17  9:21 AM  Result Value Ref Range   Glucose-Capillary 148 (H) 65 - 99 mg/dL   Glucose, capillary     Status: Abnormal   Collection Time: 09/25/17 11:17 AM  Result Value Ref Range   Glucose-Capillary  142 (H) 65 - 99 mg/dL  Aerobic Culture (superficial specimen)     Status: None   Collection Time: 09/25/17  1:55 PM  Result Value Ref Range   Specimen Description      ABSCESS Performed at [facility], [address], [place], [place] [address]    Special Requests      Normal Performed at [facility], [address], [place], [place] [address]    Gram Stain      MODERATE WBC PRESENT, PREDOMINANTLY PMN FEW GRAM POSITIVE COCCI IN PAIRS IN CLUSTERS RARE GRAM NEGATIVE RODS    Culture      FEW NORMAL SKIN FLORA Performed at [facility], [place] [address] [place], [place] [address]    Report Status 09/28/2017 FINAL   Glucose, capillary     Status: Abnormal   Collection Time: 09/25/17  5:08 PM  Result Value Ref Range   Glucose-Capillary 111 (H) 65 - 99 mg/dL  Glucose, capillary     Status: Abnormal   Collection Time: 09/25/17  8:15 PM  Result Value Ref Range   Glucose-Capillary 133 (H) 65 - 99 mg/dL  Glucose, capillary     Status: None   Collection Time: 09/25/17 11:15 PM  Result Value Ref Range   Glucose-Capillary 72 65 - 99 mg/dL  Glucose, capillary     Status: None   Collection Time: 09/26/17  3:37 AM  Result Value Ref Range   Glucose-Capillary 73 65 - 99 mg/dL  CBC     Status: Abnormal   Collection Time: 09/26/17  4:39 AM  Result Value Ref Range   WBC 19.4 (H) 4.0 - 10.5 K/uL   RBC 3.46 (L) 4.22 - 5.81 MIL/uL   Hemoglobin 9.4 (L) 13.0 - 17.0 g/dL   HCT 31.2 (L) 39.0 - 52.0 %   MCV 90.2 78.0 - 100.0 fL   MCH 27.2 26.0 - 34.0 pg   MCHC 30.1 30.0 - 36.0 g/dL   RDW 15.2 11.5 - 15.5 %   Platelets 311 150 - 400 K/uL    Comment: Performed at [facility], [address] [place], [place] [address]  Basic metabolic panel     Status: Abnormal   Collection Time: 09/26/17  4:39 AM  Result Value Ref Range   Sodium 136 135 - 145 mmol/L   Potassium  3.5 3.5 - 5.1 mmol/L   Chloride 110 101 - 111 mmol/L   CO2 19 (L) 22 - 32 mmol/L   Glucose, Bld 89 65 - 99 mg/dL   BUN 7 6 - 20 mg/dL   Creatinine, Ser 0.55 (L) 0.61 - 1.24 mg/dL   Calcium 7.7 (L) 8.9 - 10.3 mg/dL   GFR calc non Af Amer >60 >60 mL/min   GFR calc Af Amer >60 >60 mL/min    Comment: (NOTE) The eGFR has been calculated using the CKD EPI equation. This calculation has not been validated in all clinical situations. eGFR's persistently <60 mL/min signify possible Chronic Kidney Disease.    Anion gap 7 5 - 15    Comment: Performed at [facility], [address], [place], [place] [address]  Lactic acid, plasma     Status: None   Collection Time: 09/26/17  4:39 AM  Result Value Ref Range   Lactic Acid, Venous 0.5 0.5 - 1.9 mmol/L    Comment: Performed at [facility], [address], [place], [place] [address]  Glucose, capillary     Status: None   Collection Time: 09/26/17  7:56 AM  Result Value Ref Range   Glucose-Capillary 91 65 - 99 mg/dL  Glucose, capillary     Status: None   Collection Time: 09/26/17 12:02 PM  Result Value Ref Range   Glucose-Capillary 92 65 - 99 mg/dL  Glucose, capillary     Status: None   Collection Time: 09/26/17  4:51 PM  Result Value Ref Range   Glucose-Capillary 80 65 - 99 mg/dL  Glucose, capillary     Status: None   Collection Time: 09/26/17  8:03 PM  Result Value Ref Range   Glucose-Capillary 67 65 - 99 mg/dL  Glucose, capillary     Status: Abnormal   Collection Time: 09/27/17 12:24 AM  Result Value Ref Range   Glucose-Capillary 101 (H) 65 - 99 mg/dL   Comment 1 Notify RN    Comment 2 Document in Chart   Glucose, capillary     Status: None   Collection Time: 09/27/17  4:57 AM  Result Value Ref Range   Glucose-Capillary 76 65 - 99 mg/dL  Glucose, capillary     Status: None   Collection Time: 09/27/17  7:40 AM  Result Value Ref Range   Glucose-Capillary 80 65 - 99 mg/dL   Comment 1 Notify RN    Comment 2 Document in Chart    Glucose, capillary     Status: None   Collection Time: 09/27/17 11:29 AM  Result Value Ref Range   Glucose-Capillary 99 65 - 99 mg/dL   Comment 1 Notify RN    Comment 2 Document in Chart   CBC     Status: Abnormal   Collection Time: 09/27/17 11:42 AM  Result Value Ref Range   WBC 18.8 (H) 4.0 - 10.5 K/uL   RBC 3.78 (L) 4.22 - 5.81 MIL/uL   Hemoglobin 10.2 (L) 13.0 - 17.0 g/dL   HCT 33.8 (L) 39.0 - 52.0 %   MCV 89.4 78.0 - 100.0 fL   MCH 27.0 26.0 - 34.0 pg   MCHC 30.2 30.0 - 36.0 g/dL   RDW 15.3 11.5 - 15.5 %   Platelets 366 150 - 400 K/uL    Comment: Performed at [facility], [address], [place], [place] [address]  Vancomycin, trough     Status: Abnormal   Collection Time: 09/27/17  3:29 PM  Result Value Ref Range   Vancomycin Tr 11 (L) 15 - 20 ug/mL    Comment: Performed at [facility], [address], [place], [place] [address]  Glucose, capillary     Status: Abnormal   Collection Time: 09/27/17  5:40 PM  Result Value Ref Range   Glucose-Capillary 115 (H) 65 - 99 mg/dL   Comment 1 Notify RN    Comment 2 Document in Chart   Glucose, capillary     Status: Abnormal   Collection Time: 09/27/17  8:22 PM  Result Value Ref Range   Glucose-Capillary 118 (H) 65 - 99 mg/dL   Comment 1 Notify RN    Comment 2 Document in Chart   Glucose, capillary     Status: None   Collection Time: 09/28/17 12:30 AM  Result Value Ref Range   Glucose-Capillary 90 65 - 99 mg/dL   Comment 1 Notify RN    Comment 2 Document in Chart   Glucose, capillary     Status: None   Collection Time: 09/28/17  4:39 AM  Result Value Ref Range   Glucose-Capillary 91 65 - 99 mg/dL   Comment 1 Notify RN  Comment 2 Document in Chart   Glucose, capillary     Status: None   Collection Time: 09/28/17  7:04 AM  Result Value Ref Range   Glucose-Capillary 99 65 - 99 mg/dL  Aerobic Culture (superficial specimen)     Status: None   Collection Time: 09/28/17  8:20 AM  Result Value Ref Range   Specimen  Description      ABSCESS Performed at [facility], [address], [place], [place] [address]    Special Requests      NONE Performed at [facility], [address], [place], [place] [address]    Gram Stain      NO WBC SEEN RARE GRAM VARIABLE ROD Performed at [facility], [place] [address] [place], [place] [address]    Culture RARE ENTEROCOCCUS FAECALIS    Report Status 10/02/2017 FINAL    Organism ID, Bacteria ENTEROCOCCUS FAECALIS       Susceptibility   Enterococcus faecalis - MIC*    AMPICILLIN <=2 SENSITIVE Sensitive     VANCOMYCIN 1 SENSITIVE Sensitive     GENTAMICIN SYNERGY RESISTANT Resistant     * RARE ENTEROCOCCUS FAECALIS  Glucose, capillary     Status: Abnormal   Collection Time: 09/28/17  9:02 AM  Result Value Ref Range   Glucose-Capillary 102 (H) 65 - 99 mg/dL  Glucose, capillary     Status: Abnormal   Collection Time: 09/28/17 11:13 AM  Result Value Ref Range   Glucose-Capillary 115 (H) 65 - 99 mg/dL  Glucose, capillary     Status: Abnormal   Collection Time: 09/28/17  5:12 PM  Result Value Ref Range   Glucose-Capillary 154 (H) 65 - 99 mg/dL  Glucose, capillary     Status: Abnormal   Collection Time: 09/28/17  8:45 PM  Result Value Ref Range   Glucose-Capillary 100 (H) 65 - 99 mg/dL   Comment 1 Notify RN    Comment 2 Document in Chart   Glucose, capillary     Status: Abnormal   Collection Time: 09/29/17  1:07 AM  Result Value Ref Range   Glucose-Capillary 133 (H) 65 - 99 mg/dL  Glucose, capillary     Status: None   Collection Time: 09/29/17  4:26 AM  Result Value Ref Range   Glucose-Capillary 97 65 - 99 mg/dL  Glucose, capillary     Status: Abnormal   Collection Time: 09/29/17  8:25 AM  Result Value Ref Range   Glucose-Capillary 101 (H) 65 - 99 mg/dL  Glucose, capillary     Status: Abnormal   Collection Time: 09/29/17 11:51 AM  Result Value Ref Range   Glucose-Capillary 155 (H) 65 - 99 mg/dL   Comment 1 Notify RN    Comment 2 Document in  Chart   Glucose, capillary     Status: None   Collection Time: 09/29/17  5:49 PM  Result Value Ref Range   Glucose-Capillary 79 65 - 99 mg/dL  Glucose, capillary     Status: Abnormal   Collection Time: 09/29/17  8:06 PM  Result Value Ref Range   Glucose-Capillary 122 (H) 65 - 99 mg/dL   Comment 1 Notify RN    Comment 2 Document in Chart   Glucose, capillary     Status: Abnormal   Collection Time: 09/30/17 12:05 AM  Result Value Ref Range   Glucose-Capillary 112 (H) 65 - 99 mg/dL   Comment 1 Notify RN    Comment 2 Document in Chart   Glucose, capillary  Status: None   Collection Time: 09/30/17  5:01 AM  Result Value Ref Range   Glucose-Capillary 86 65 - 99 mg/dL  Glucose, capillary     Status: None   Collection Time: 09/30/17  5:09 AM  Result Value Ref Range   Glucose-Capillary 87 65 - 99 mg/dL   Comment 1 Notify RN    Comment 2 Document in Chart   CBC     Status: Abnormal   Collection Time: 09/30/17  7:10 AM  Result Value Ref Range   WBC 18.4 (H) 4.0 - 10.5 K/uL   RBC 3.17 (L) 4.22 - 5.81 MIL/uL   Hemoglobin 8.8 (L) 13.0 - 17.0 g/dL   HCT 28.9 (L) 39.0 - 52.0 %   MCV 91.2 78.0 - 100.0 fL   MCH 27.8 26.0 - 34.0 pg   MCHC 30.4 30.0 - 36.0 g/dL   RDW 15.7 (H) 11.5 - 15.5 %   Platelets 419 (H) 150 - 400 K/uL    Comment: Performed at [facility], [address], [place], [place] [address]  Basic metabolic panel     Status: Abnormal   Collection Time: 09/30/17  7:10 AM  Result Value Ref Range   Sodium 136 135 - 145 mmol/L   Potassium 3.7 3.5 - 5.1 mmol/L   Chloride 107 101 - 111 mmol/L   CO2 23 22 - 32 mmol/L   Glucose, Bld 95 65 - 99 mg/dL   BUN 10 6 - 20 mg/dL   Creatinine, Ser 0.46 (L) 0.61 - 1.24 mg/dL   Calcium 8.1 (L) 8.9 - 10.3 mg/dL   GFR calc non Af Amer >60 >60 mL/min   GFR calc Af Amer >60 >60 mL/min    Comment: (NOTE) The eGFR has been calculated using the CKD EPI equation. This calculation has not been validated in all clinical situations. eGFR's  persistently <60 mL/min signify possible Chronic Kidney Disease.    Anion gap 6 5 - 15    Comment: Performed at [facility], [address] [place], [place] [address]  Glucose, capillary     Status: None   Collection Time: 09/30/17  7:54 AM  Result Value Ref Range   Glucose-Capillary 92 65 - 99 mg/dL  Glucose, capillary     Status: Abnormal   Collection Time: 09/30/17  1:16 PM  Result Value Ref Range   Glucose-Capillary 125 (H) 65 - 99 mg/dL  Glucose, capillary     Status: None   Collection Time: 09/30/17  6:35 PM  Result Value Ref Range   Glucose-Capillary 83 65 - 99 mg/dL  CBC     Status: Abnormal   Collection Time: 10/02/17  4:00 AM  Result Value Ref Range   WBC 17.2 (H) 4.0 - 10.5 K/uL   RBC 3.72 (L) 4.22 - 5.81 MIL/uL   Hemoglobin 10.1 (L) 13.0 - 17.0 g/dL   HCT 34.6 (L) 39.0 - 52.0 %   MCV 93.0 78.0 - 100.0 fL   MCH 27.2 26.0 - 34.0 pg   MCHC 29.2 (L) 30.0 - 36.0 g/dL   RDW 16.5 (H) 11.5 - 15.5 %   Platelets 525 (H) 150 - 400 K/uL    Comment: Performed at [facility], [address] [place], [place] [address]  Basic metabolic panel     Status: Abnormal   Collection Time: 10/02/17  4:00 AM  Result Value Ref Range   Sodium 138 135 - 145 mmol/L   Potassium 3.9 3.5 - 5.1 mmol/L   Chloride 108  101 - 111 mmol/L   CO2 22 22 - 32 mmol/L   Glucose, Bld 135 (H) 65 - 99 mg/dL   BUN 12 6 - 20 mg/dL   Creatinine, Ser 0.70 0.61 - 1.24 mg/dL   Calcium 8.7 (L) 8.9 - 10.3 mg/dL   GFR calc non Af Amer >60 >60 mL/min   GFR calc Af Amer >60 >60 mL/min    Comment: (NOTE) The eGFR has been calculated using the CKD EPI equation. This calculation has not been validated in all clinical situations. eGFR's persistently <60 mL/min signify possible Chronic Kidney Disease.    Anion gap 8 5 - 15    Comment: Performed at [facility], [address] [place], [place] [address]  CBC with Differential/Platelet     Status: Abnormal   Collection Time: 10/10/17  7:14 AM  Result Value Ref Range    WBC 8.5 4.0 - 10.5 K/uL   RBC 4.28 4.22 - 5.81 MIL/uL   Hemoglobin 11.6 (L) 13.0 - 17.0 g/dL   HCT 40.0 39.0 - 52.0 %   MCV 93.5 78.0 - 100.0 fL   MCH 27.1 26.0 - 34.0 pg   MCHC 29.0 (L) 30.0 - 36.0 g/dL   RDW 16.6 (H) 11.5 - 15.5 %   Platelets 443 (H) 150 - 400 K/uL   Neutrophils Relative % 45 %   Neutro Abs 3.8 1.7 - 7.7 K/uL   Lymphocytes Relative 41 %   Lymphs Abs 3.5 0.7 - 4.0 K/uL   Monocytes Relative 8 %   Monocytes Absolute 0.7 0.1 - 1.0 K/uL   Eosinophils Relative 4 %   Eosinophils Absolute 0.4 0.0 - 0.7 K/uL   Basophils Relative 2 %   Basophils Absolute 0.2 (H) 0.0 - 0.1 K/uL    Comment: Performed at [facility], [address] [place], [place] [address]  Comprehensive metabolic panel     Status: Abnormal   Collection Time: 10/10/17  7:14 AM  Result Value Ref Range   Sodium 133 (L) 135 - 145 mmol/L   Potassium 4.7 3.5 - 5.1 mmol/L   Chloride 101 101 - 111 mmol/L   CO2 23 22 - 32 mmol/L   Glucose, Bld 102 (H) 65 - 99 mg/dL   BUN 19 6 - 20 mg/dL   Creatinine, Ser 0.92 0.61 - 1.24 mg/dL   Calcium 9.5 8.9 - 10.3 mg/dL   Total Protein 7.6 6.5 - 8.1 g/dL   Albumin 3.2 (L) 3.5 - 5.0 g/dL   AST 22 15 - 41 U/L   ALT 46 17 - 63 U/L   Alkaline Phosphatase 53 38 - 126 U/L   Total Bilirubin 0.3 0.3 - 1.2 mg/dL   GFR calc non Af Amer >60 >60 mL/min   GFR calc Af Amer >60 >60 mL/min    Comment: (NOTE) The eGFR has been calculated using the CKD EPI equation. This calculation has not been validated in all clinical situations. eGFR's persistently <60 mL/min signify possible Chronic Kidney Disease.    Anion gap 9 5 - 15    Comment: Performed at [facility] [place], [place] [address]  Comprehensive metabolic panel     Status: Abnormal   Collection Time: 10/17/17  7:10 AM  Result Value Ref Range   Sodium 139 135 - 145 mmol/L   Potassium 4.1 3.5 - 5.1 mmol/L   Chloride 106 101 - 111 mmol/L   CO2 24 22 - 32 mmol/L   Glucose, Bld 104 (H) 65 -  99 mg/dL    BUN 18 6 - 20 mg/dL   Creatinine, Ser 0.57 (L) 0.61 - 1.24 mg/dL   Calcium 9.5 8.9 - 10.3 mg/dL   Total Protein 6.8 6.5 - 8.1 g/dL   Albumin 3.1 (L) 3.5 - 5.0 g/dL   AST 16 15 - 41 U/L   ALT 35 17 - 63 U/L   Alkaline Phosphatase 49 38 - 126 U/L   Total Bilirubin 0.4 0.3 - 1.2 mg/dL   GFR calc non Af Amer >60 >60 mL/min   GFR calc Af Amer >60 >60 mL/min    Comment: (NOTE) The eGFR has been calculated using the CKD EPI equation. This calculation has not been validated in all clinical situations. eGFR's persistently <60 mL/min signify possible Chronic Kidney Disease.    Anion gap 9 5 - 15    Comment: Performed at [facility], [address] [place], [place] [address]  Hepatitis c antibody (reflex)     Status: None   Collection Time: 10/28/17  9:52 AM  Result Value Ref Range   HCV Ab TEST REQUEST RECEIVED WITHOUT APPROPRIATE SPECIMEN     Comment: TEST WILL BE CREDITED Performed at [facility], [address], [place], [place] [address]   Hepatitis C antibody     Status: None   Collection Time: 10/30/17 12:38 AM  Result Value Ref Range   HCV Ab 0.3 0.0 - 0.9 s/co ratio    Comment: (NOTE)                                  Negative:     < 0.8                             Indeterminate: 0.8 - 0.9                                  Positive:     > 0.9 The CDC recommends that a positive HCV antibody result be followed up with a HCV Nucleic Acid Amplification test (098119). Performed At: [facility] [place], [place] [id number] Rush Farmer MD [id number] Performed at [facility], [address] [place], [place] [address]    CMP     Component Value Date/Time   NA 139 10/17/2017 0710   K 4.1 10/17/2017 0710   CL 106 10/17/2017 0710   CO2 24 10/17/2017 0710   GLUCOSE 104 (H) 10/17/2017 0710   BUN 18 10/17/2017 0710   CREATININE 0.57 (L) 10/17/2017 0710   CALCIUM 9.5 10/17/2017 0710   CALCIUM 11.5 (H) 09/05/2017 0709   PROT 6.8 10/17/2017 0710    ALBUMIN 3.1 (L) 10/17/2017 0710   AST 16 10/17/2017 0710   ALT 35 10/17/2017 0710   ALKPHOS 49 10/17/2017 0710   BILITOT 0.4 10/17/2017 0710   GFRNONAA >60 10/17/2017 0710   GFRAA >60 10/17/2017 0710    Diabetic Labs (most recent): Lab Results  Component Value Date   HGBA1C 5.8 (H) 07/11/2017   HGBA1C 14.3 (H) 04/02/2017     Lipid Panel ( most recent) Lipid Panel     Component Value Date/Time   CHOL 93 04/02/2017 0607   TRIG 135 04/02/2017 0607   HDL 28 (L) 04/02/2017 0607   CHOLHDL 3.3 04/02/2017 0607   VLDL 27 04/02/2017 4132  [place] 38 04/02/2017 8718      Lab Results  Component Value Date   TSH 0.913 07/17/2017     Assessment & Plan:   1. 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  -He is status post Reclast infusion with subsequent improvement of calcium to 9-9.5.  -He advised to discontinue calcium supplement, large dose of vitamin D supplement.   -He will continue to benefit from vitamin D3 2000 units daily.    - I advised patient to maintain close follow up with Virgie Dad, MD for primary care needs. Follow up plan: Return in about 3 months (around 02/07/2018) for follow up with pre-visit labs.   Glade Lloyd, MD [facility] [address] [place], [place] [address] Phone: [phone]  Fax: [phone]     11/08/2017, 3:25 PM  This note was partially dictated with voice recognition software. Similar sounding words can be transcribed inadequately or may not  be corrected upon review.

## 2017-11-20 ENCOUNTER — Other Ambulatory Visit: Payer: Self-pay

## 2017-11-20 MED ORDER — TRAMADOL HCL 50 MG PO TABS: 50.0000 mg | ORAL_TABLET | Freq: Four times a day (QID) | ORAL | 0 refills | Status: DC | PRN

## 2017-11-20 NOTE — Telephone Encounter (Signed)
RX Fax for [facility]@ [phone]  

## 2017-12-05 ENCOUNTER — Encounter: Payer: Self-pay | Admitting: Internal Medicine

## 2017-12-05 ENCOUNTER — Non-Acute Institutional Stay (SKILLED_NURSING_FACILITY): Payer: Medicaid Other | Admitting: Internal Medicine

## 2017-12-05 DIAGNOSIS — I69354 Hemiplegia and hemiparesis following cerebral infarction affecting left non-dominant side: Secondary | ICD-10-CM

## 2017-12-05 DIAGNOSIS — I10 Essential (primary) hypertension: Secondary | ICD-10-CM | POA: Diagnosis not present

## 2017-12-05 DIAGNOSIS — E108 Type 1 diabetes mellitus with unspecified complications: Secondary | ICD-10-CM

## 2017-12-05 DIAGNOSIS — Z931 Gastrostomy status: Secondary | ICD-10-CM

## 2017-12-05 DIAGNOSIS — I480 Paroxysmal atrial fibrillation: Secondary | ICD-10-CM

## 2017-12-05 NOTE — Progress Notes (Signed)
Location:   [place] Room Number: 104/W Place of Service:  SNF (31) Provider:  Clydene Fake, MD  Patient Care Team: Virgie Dad, MD as PCP - General (Internal Medicine)  Extended Emergency Contact Information Primary Emergency Contact: Theresia Bough [place] Phone: [phone] Relation: Sister Secondary Emergency Contact: Nickie Retort [place] Mobile Phone: [phone] Relation: Aunt  Code Status:  Full Code Goals of care: Advanced Directive information Advanced Directives 12/05/2017  Does Patient Have a Medical Advance Directive? Yes  Type of Advance Directive (No Data)  Does patient want to make changes to medical advance directive? No - Patient declined  Would patient like information on creating a medical advance directive? No - Patient declined     Chief Complaint  Patient presents with  . Medical Management of Chronic Issues    Routine Visit    HPI:  Pt is a 57 y.o. male seen today for medical management of chronic diseases.   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 And sepsis due to Gluteal Abscess in 02/19  Patient is doing well in facility. He is eating better and maintaining his weight. His Gluteal wound is now healed . Therapy is also evaluating him again for OT. And PT. He did not have any complains today.No New Nursing issues.    Past Medical History:  Diagnosis Date  . DM (diabetes mellitus) ([place])   . Hypertension   . Stroke [facility]    Past Surgical History:  Procedure Laterality Date  . CRANIECTOMY Right 04/01/2017   Procedure: RIGHT DECOMPRESSIVE CRANIECTOMY;  Surgeon: Ditty, Kevan Ny, MD;  Location: [facility];  Service: Neurosurgery;   Laterality: Right;  . ESOPHAGOGASTRODUODENOSCOPY N/A 04/13/2017   Procedure: ESOPHAGOGASTRODUODENOSCOPY (EGD);  Surgeon: Georganna Skeans, MD;  Location: [facility];  Service: General;  Laterality: N/A;  bedside  . INCISION AND DRAINAGE PERIRECTAL ABSCESS N/A 09/28/2017   Procedure: IRRIGATION AND DEBRIDEMENT PERIANAL ABSCESS;  Surgeon: Virl Cagey, MD;  Location: [facility];  Service: General;  Laterality: N/A;  . PEG PLACEMENT N/A 04/13/2017   Procedure: PERCUTANEOUS ENDOSCOPIC GASTROSTOMY (PEG) PLACEMENT;  Surgeon: Georganna Skeans, MD;  Location[facility] ENDOSCOPY;  Service: General;  Laterality: N/A;    Allergies  Allergen Reactions  . Penicillins Hives    Outpatient Encounter Medications as of 12/05/2017  Medication Sig  . acetaminophen (TYLENOL) 325 MG tablet Place 650 mg into feeding tube every 4 (four) hours as needed.  . Amino Acids-Protein Hydrolys (FEEDING SUPPLEMENT, PRO-STAT SUGAR FREE 64,) LIQD Place 30 mLs into feeding tube daily. Per gastric tube   . apixaban (ELIQUIS) 5 MG TABS tablet Place 1 tablet (5 mg total) into feeding tube 2 (two) times daily.  . bisacodyl (DULCOLAX) 10 MG suppository Place 1 suppository (10 mg total) rectally daily as needed for moderate constipation.  . Cholecalciferol (VITAMIN D) 2000 units CAPS Take 1 capsule (2,000 Units total) by mouth daily.  . coal tar (NEUTROGENA T-GEL) 0.5 % shampoo Apply to scalp on shower days twice a week Mon., Thru  . docusate (COLACE) 50 MG/5ML liquid Place 10 mLs (100 mg total) into feeding tube 2 (two) times daily as needed for mild constipation.  . gabapentin (NEURONTIN) 300 MG capsule Take 1 capsule (300 mg total) by mouth at bedtime.  . insulin detemir (LEVEMIR)  100 UNIT/ML injection Inject 27 Units into the skin daily.  . metoprolol tartrate (LOPRESSOR) 25 MG tablet Place 0.5 tablets (12.5 mg total) into feeding tube 2 (two) times daily.  . mirtazapine (REMERON) 15 MG tablet Take 7.5 mg by mouth at bedtime.  .  Nutritional Supplements (FEEDING SUPPLEMENT, GLUCERNA 1.2 CAL,) LIQD Place 300 mLs into feeding tube 4 (four) times daily.  . pantoprazole sodium (PROTONIX) 40 mg/20 mL PACK Place 20 mLs (40 mg total) into feeding tube daily.  . polyethylene glycol (MIRALAX / GLYCOLAX) packet Place 17 g into feeding tube daily as needed.  . traMADol (ULTRAM) 50 MG tablet Take 1 tablet (50 mg total) by mouth every 6 (six) hours as needed.  . Water For Irrigation, Sterile (FREE WATER) SOLN Place 300 mLs into feeding tube every 4 (four) hours. Keep HOB elevated at 45 degrees at all times during feedings, flushings, and medications 12a, 4a, 8a, 12p, 4p,8p  . [DISCONTINUED] chlorhexidine (PERIDEX) 0.12 % solution 15 mLs by Mouth Rinse route 2 (two) times daily.  . [DISCONTINUED] insulin detemir (LEVEMIR) 100 UNIT/ML injection Inject 0.34 mLs (34 Units total) into the skin daily.   No facility-administered encounter medications on file as of 12/05/2017.      Review of Systems  Review of Systems  Constitutional: Negative for activity change, appetite change, chills, diaphoresis, fatigue and fever.  HENT: Negative for mouth sores, postnasal drip, rhinorrhea, sinus pain and sore throat.   Respiratory: Negative for apnea, cough, chest tightness, shortness of breath and wheezing.   Cardiovascular: Negative for chest pain, palpitations and leg swelling.  Gastrointestinal: Negative for abdominal distention, abdominal pain, constipation, diarrhea, nausea and vomiting.  Genitourinary: Negative for dysuria and frequency.  Musculoskeletal: Negative for arthralgias, joint swelling and myalgias.  Skin: Negative for rash.  Neurological: Negative for dizziness, syncope, weakness, light-headedness and numbness.  Psychiatric/Behavioral: Negative for behavioral problems, confusion and sleep disturbance.      There is no immunization history on file for this patient. Pertinent  Health Maintenance Due  Topic Date Due  .  OPHTHALMOLOGY EXAM  04/06/1971  . URINE MICROALBUMIN  04/06/1971  . HEMOGLOBIN A1C  01/09/2018  . INFLUENZA VACCINE  03/08/2018  . FOOT EXAM  08/24/2018  . COLONOSCOPY  Discontinued   Fall Risk  10/10/2017 09/13/2017 09/04/2017  Falls in the past year? No No No  Risk for fall due to : - Impaired balance/gait;Impaired mobility;Medication side effect;Mental status change -   Functional Status Survey:    Vitals:   12/05/17 0925  BP: 121/85  Pulse: 72  Resp: 15  Temp: 97.9 F (36.6 C)  TempSrc: Oral  SpO2: 94%  Weight: 160 lb 3.2 oz (72.7 kg)  Height: 5\' 6"  (1.676 m)   Body mass index is 25.86 kg/m. Physical Exam  Constitutional: He appears well-developed and well-nourished.  HENT:  Mouth/Throat: Oropharynx is clear and moist.  Eyes: Pupils are equal, round, and reactive to light.  Neck: Neck supple.  Cardiovascular: Normal rate and regular rhythm.  No murmur heard. Pulmonary/Chest: Effort normal and breath sounds normal. No stridor. No respiratory distress.  Abdominal: Soft. Bowel sounds are normal. He exhibits no distension. There is no tenderness. There is no guarding.  Musculoskeletal: He exhibits no edema.  Neurological: He is alert.  Left Hemiparesis  Skin: Skin is warm and dry.  Psychiatric: He has a normal mood and affect. His behavior is normal. Thought content normal.    Labs reviewed: Recent Labs    04/12/17 0151  09/05/17 0709 09/24/17 1527  09/25/17 0545  10/02/17 0400 10/10/17 0714 10/17/17 0710  NA 139   < >  --  140   < >  --    < > 138 133* 139  K 3.8   < >  --  4.2   < >  --    < > 3.9 4.7 4.1  CL 106   < >  --  103   < >  --    < > 108 101 106  CO2 26   < >  --  26   < >  --    < > 22 23 24   GLUCOSE 147*   < >  --  141*   < >  --    < > 135* 102* 104*  BUN 15   < >  --  24*   < >  --    < > 12 19 18   CREATININE 0.75   < >  --  0.90   < >  --    < > 0.70 0.92 0.57*  CALCIUM 8.5*   < > 11.5* 9.2   < >  --    < > 8.7* 9.5 9.5  MG 2.2  --  2.1  --    --  2.1  --   --   --   --   PHOS 4.4  --  3.7 2.2*  --   --   --   --   --   --    < > = values in this interval not displayed.   Recent Labs    09/24/17 1527 10/10/17 0714 10/17/17 0710  AST 34 22 16  ALT 59 46 35  ALKPHOS 102 53 49  BILITOT 1.0 0.3 0.4  PROT 8.3* 7.6 6.8  ALBUMIN 2.6* 3.2* 3.1*   Recent Labs    07/11/17 1025 09/24/17 1527  09/30/17 0710 10/02/17 0400 10/10/17 0714  WBC 11.9* 23.8*   < > 18.4* 17.2* 8.5  NEUTROABS 7.2 19.0*  --   --   --  3.8  HGB 14.2 11.9*   < > 8.8* 10.1* 11.6*  HCT 47.5 38.9*   < > 28.9* 34.6* 40.0  MCV 90.1 90.7   < > 91.2 93.0 93.5  PLT 232 271   < > 419* 525* 443*   < > = values in this interval not displayed.   Lab Results  Component Value Date   TSH 0.913 07/17/2017   Lab Results  Component Value Date   HGBA1C 5.8 (H) 07/11/2017   Lab Results  Component Value Date   CHOL 93 04/02/2017   HDL 28 (L) 04/02/2017   LDLCALC 38 04/02/2017   TRIG 135 04/02/2017   CHOLHDL 3.3 04/02/2017    Significant Diagnostic Results in last 30 days:  No results found.  Assessment/Plan Type 1 diabetes mellitus with complications Last X5M was 5.8 in 12/04 BS are running mostly less then 150 Now only on Levimir Repeat A1C AS we are going to reduce his tube feed will decrease his Levimir to 22 units Continue to follow Accu checks.  AF (paroxysmal atrial fibrillation) In Sinus rhythm On Eliquis  Cerebrovascular accident (CVA) due to embolism of right middle cerebral artery On Eliquis Therapy reconsidering as patient has improved cognitively Continues to be full Nursing care LDL less then 60 Repeat Fasting Lipid BP Controlled  Follows with Neurology Decrease Neurontin to 200 mg QHS Essential hypertension On Metoprolol  S/PPEG Placement Patient continues to need PEG tube . But his PO intake has improved . Will continue Remeron D/W Dietary we will taper his Peg feeding and encourage PO feeds. Status post  craniectomy Arrange Follow up With Dr Cyndy Freeze.  Hypercalcemia On Iv Reclast Thought to be due to Immobility Follows with Dr Dorris Fetch.closely. Calcium stopped . On Lower dose of Vit D Calcium staying WNL. Gluteal wound Completely resolved  Family/ staff Communication:   Labs/tests ordered:    Total time spent in this patient care encounter was 25_ minutes; greater than 50% of the visit spent counseling patient, reviewing records , Labs and coordinating care for problems addressed at this encounter.

## 2017-12-05 NOTE — Progress Notes (Signed)
Location:   [facility] Room Number: 152/W Place of Service:  SNF (31) Provider:  Freddi Starr, MD  Patient Care Team: Virgie Dad, MD as PCP - General (Internal Medicine)  Extended Emergency Contact Information Primary Emergency Contact: Theresia Bough [place] Phone: [phone] Relation: Sister Secondary Emergency Contact: Nickie Retort [place] of [place] Mobile Phone: [phone] Relation: Aunt  Code Status:  Full Code Goals of care: Advanced Directive information Advanced Directives 10/26/2017  Does Patient Have a Medical Advance Directive? Yes  Type of Advance Directive (No Data)  Does patient want to make changes to medical advance directive? No - Patient declined  Would patient like information on creating a medical advance directive? No - Patient declined     Chief Complaint  Patient presents with  . Medical Management of Chronic Issues    [place] Visit  For medical management of chronic medical conditions including history of CVA with left-sided hemiparalysis- history of right frontal temporoparietal craniotomy-recent history of rectal abscess-type 1 diabetes-atrial fibrillation-hypertension-  HPI:  Pt is a 57 y.o. male seen today for medical management of chronic diseases.  As noted above.  Patient has a very complicated medical history.  Patient was initially admitted here for therapy possible long-term care after being in the hospital from late August to late October 2018.  He [place] stop hospital he was found to have a blood sugar of over 1100 and was in DKA.  He was intubated and on a ventilator secondary to shock.  While in the ICU he sustained an acute CVA.  CT scan showed a large right MCA infarct with diffuse edema and mass-effect he underwent decompression with a frontotemporal parietal craniotomy  And subsequent duraplasty on April 01, 2017.  He also developed atrial fibrillation that required amiodarone which was stopped later he was started on Eliquis.  At one point he required a tracheostomy which is since been removed he continues with a PEG tube with a history of dysphasia and has tube feedings as well as oral intake.  He also is a type I diabetic blood sugars appear to be fairly well controlled on Levemir CBGs in the morning appear to be fairly stable largely in the lower 100s this is the case also at noon however later in the day occasionally he has readings in the 70s and 80s-although apparently he is not overtly symptomatic.  Most recent acute issue was a perirectal abscess with sepsis which required hospitalization  .  He did respond to IV antibiotics surgery was consulted for an incision and drainage which was performed in late February.  He also had drains placed which have since been removed.  This abscess has largely resolved and he is followed by wound care he does not complain of acute pain in his buttocks area though apparently at times it is still sore.  He also has a history of hypercalcemia and is followed by endocrinology--this is thought to be secondary to his immobility he has been initiated on low-dose calcium supplementation with calcium carbonate-also continued on vitamin D.  This appears to have normalized with a calcium of 9.5 on lab done on March 12.  In regards to his CVA he continues with significant left-sided hemiparalysis and is followed by neurology goal is to keep his systolic blood pressure is possible lower than 517 and diastolic less than 90 and keep LDL less than 70 years per recent  labs his LDL of 60 he is on a statin-blood pressures also appear to be fairly well controlled recent readings 116/74-124/66 he is on Lopressor.  In regards to A. fib he is rate controlled on the Lopressor he is on Eliquis for anticoagulation.  He also has an element of peripheral neuropathy continues on  Neurontin nightly.  In regards to his appetite he has been started on Remeron with hopes of appetite stimulation his weight has been relatively stable although I suspect his tube feedings are contributing to this.  He is also on Protonix.  Currently does not really have any acute complaints he is resting in bed comfortably appears to be in better spirits than when I have seen him recently previously.         Past Medical History:  Diagnosis Date  . DM (diabetes mellitus) ([place])   . Hypertension   . Stroke [facility])    Past Surgical History:  Procedure Laterality Date  . CRANIECTOMY Right 04/01/2017   Procedure: RIGHT DECOMPRESSIVE CRANIECTOMY;  Surgeon: Ditty, Kevan Ny, MD;  Location: [facility];  Service: Neurosurgery;  Laterality: Right;  . ESOPHAGOGASTRODUODENOSCOPY N/A 04/13/2017   Procedure: ESOPHAGOGASTRODUODENOSCOPY (EGD);  Surgeon: Georganna Skeans, MD;  Location: [place];  Service: General;  Laterality: N/A;  bedside  . INCISION AND DRAINAGE PERIRECTAL ABSCESS N/A 09/28/2017   Procedure: IRRIGATION AND DEBRIDEMENT PERIANAL ABSCESS;  Surgeon: Virl Cagey, MD;  Location: [facility];  Service: General;  Laterality: N/A;  . PEG PLACEMENT N/A 04/13/2017   Procedure: PERCUTANEOUS ENDOSCOPIC GASTROSTOMY (PEG) PLACEMENT;  Surgeon: Georganna Skeans, MD;  Location[facility] ENDOSCOPY;  Service: General;  Laterality: N/A;    Allergies  Allergen Reactions  . Penicillins Hives    Outpatient Encounter Medications as of 10/26/2017  Medication Sig  . acetaminophen (TYLENOL) 325 MG tablet Place 650 mg into feeding tube every 4 (four) hours as needed.  . Amino Acids-Protein Hydrolys (FEEDING SUPPLEMENT, PRO-STAT SUGAR FREE 64,) LIQD Place 30 mLs into feeding tube daily. Per gastric tube   . apixaban (ELIQUIS) 5 MG TABS tablet Place 1 tablet (5 mg total) into feeding tube 2 (two) times daily.  . bisacodyl (DULCOLAX) 10 MG suppository Place 1 suppository (10 mg total) rectally daily as needed  for moderate constipation.  . calcium carbonate (TUMS) 500 MG chewable tablet Chew 1 tablet (200 mg of elemental calcium total) by mouth daily.  . chlorhexidine (PERIDEX) 0.12 % solution 15 mLs by Mouth Rinse route 2 (two) times daily.  . Cholecalciferol (VITAMIN D3) 50000 units TABS Give 5 tablets gastric tube once a day  . docusate (COLACE) 50 MG/5ML liquid Place 10 mLs (100 mg total) into feeding tube 2 (two) times daily as needed for mild constipation.  . gabapentin (NEURONTIN) 300 MG capsule Take 1 capsule (300 mg total) by mouth at bedtime.  . insulin detemir (LEVEMIR) 100 UNIT/ML injection Inject 0.34 mLs (34 Units total) into the skin daily.  . metoprolol tartrate (LOPRESSOR) 25 MG tablet Place 0.5 tablets (12.5 mg total) into feeding tube 2 (two) times daily.  . mirtazapine (REMERON) 15 MG tablet Take 7.5 mg by mouth at bedtime.  . Nutritional Supplements (FEEDING SUPPLEMENT, GLUCERNA 1.2 CAL,) LIQD Place 300 mLs into feeding tube 4 (four) times daily.  . pantoprazole sodium (PROTONIX) 40 mg/20 mL PACK Place 20 mLs (40 mg total) into feeding tube daily.  . polyethylene glycol (MIRALAX / GLYCOLAX) packet Place 17 g into feeding tube daily as needed.  . traMADol (ULTRAM) 50 MG tablet  Take 1 tablet (50 mg total) by mouth every 6 (six) hours as needed.  . Water For Irrigation, Sterile (FREE WATER) SOLN Place 300 mLs into feeding tube every 4 (four) hours. Keep HOB elevated at 45 degrees at all times during feedings, flushings, and medications 12a, 4a, 8a, 12p, 4p,8p  . zoledronic acid (RECLAST) 5 MG/100ML SOLN injection Inject 5 mg into the vein once.  . [DISCONTINUED] feeding supplement, ENSURE ENLIVE, (ENSURE ENLIVE) LIQD Take 237 mLs by mouth 2 (two) times daily between meals.   No facility-administered encounter medications on file as of 10/26/2017.      Review of Systems   In general is not complaining of fever chills and appears he is lost about 6 pounds in the past month although  this is been stable now for about 3 weeks-he is on tube feedings which are managed by dietary with supplements.  Skin is not complain of rashes or itching does have a perirectal abscess which appears to be resolving unremarkably per review of recent surgery no.  Head ears eyes nose mouth and throat is not complaining of any sore throat or visual changes from baseline.  At times will complain of a headache this is chronic and I believe he is improving somewhat  He does have dysphasia.  Respiratory does not complain of shortness of breath or cough.  Cardiac does not complain of chest pain.  Does not appear to have significant lower extremity edema.  GI is not complaining of abdominal discomfort nausea vomiting diarrhea or constipation he does have a PEG tube.  GU is not complaining of dysuria.  Musculoskeletal continues with significant left-sided hemiparalysis at times will complain of buttocks pain and generalized pain but this appears to be relieved with the tramadol.  Neurologic again does have history CVA with left-sided hemiparalysis does complain occasionally of headache also appears to have some element of diabetic neuropathy.  Psych appears to be in better spirits does not complain of being overtly depressed or anxious.       There is no immunization history on file for this patient. Pertinent  Health Maintenance Due  Topic Date Due  . INFLUENZA VACCINE  10/31/2017 (Originally 03/08/2017)  . OPHTHALMOLOGY EXAM  10/31/2017 (Originally 04/06/1971)  . URINE MICROALBUMIN  10/31/2017 (Originally 04/06/1971)  . COLONOSCOPY  10/31/2017 (Originally 04/06/2011)  . HEMOGLOBIN A1C  01/09/2018  . FOOT EXAM  08/24/2018   Fall Risk  10/10/2017 09/13/2017 09/04/2017  Falls in the past year? No No No  Risk for fall due to : - Impaired balance/gait;Impaired mobility;Medication side effect;Mental status change -   Functional Status Survey:    Vitals:   10/26/17 1302  BP: 116/74  Pulse: 90    Resp: 20  Temp: (!) 97.2 F (36.2 C)  TempSrc: Oral  SpO2: 94%  Weight: 164 lb 6.4 oz (74.6 kg)  Height: 5\' 6"  (1.676 m)   Body mass index is 26.53 kg/m. Physical Exam In general this is a pleasant middle-age male in no distress lying comfortably in bed.  His skin is warm and dry limited view of his buttocks area secondary to patient positioning but from what I was able to ascertain I could not see any significant sign of infection of the buttocks area.  He is status post craniotomy most prominently right temporoparietal area.  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  Heart  is regular rate and rhythm without murmur gallop or rub.  Abdomen is soft somewhat obese nontender with positive bowel sounds PEG site appears unremarkable without sign of infection or drainage.  Musculoskeletal does have left-sided hemiparalysis is able to move the right upper and lower extremities it appears at baseline with some deconditioning.  Neurologic as noted above he does have mild dysarthric speech.  As well as left facial weakness  Psych he is alert and oriented pleasant and appropriate Labs reviewed: Recent Labs    04/12/17 0151  09/05/17 0709 09/24/17 1527  09/25/17 0545  10/02/17 0400 10/10/17 0714 10/17/17 0710  NA 139   < >  --  140   < >  --    < > 138 133* 139  K 3.8   < >  --  4.2   < >  --    < > 3.9 4.7 4.1  CL 106   < >  --  103   < >  --    < > 108 101 106  CO2 26   < >  --  26   < >  --    < > 22 23 24   GLUCOSE 147*   < >  --  141*   < >  --    < > 135* 102* 104*  BUN 15   < >  --  24*   < >  --    < > 12 19 18   CREATININE 0.75   < >  --  0.90   < >  --    < > 0.70 0.92 0.57*  CALCIUM 8.5*   < > 11.5* 9.2   < >  --    < > 8.7* 9.5 9.5  MG 2.2  --  2.1  --   --  2.1  --   --   --   --   PHOS 4.4  --  3.7 2.2*  --   --   --   --    --   --    < > = values in this interval not displayed.   Recent Labs    09/24/17 1527 10/10/17 0714 10/17/17 0710  AST 34 22 16  ALT 59 46 35  ALKPHOS 102 53 49  BILITOT 1.0 0.3 0.4  PROT 8.3* 7.6 6.8  ALBUMIN 2.6* 3.2* 3.1*   Recent Labs    07/11/17 1025 09/24/17 1527  09/30/17 0710 10/02/17 0400 10/10/17 0714  WBC 11.9* 23.8*   < > 18.4* 17.2* 8.5  NEUTROABS 7.2 19.0*  --   --   --  3.8  HGB 14.2 11.9*   < > 8.8* 10.1* 11.6*  HCT 47.5 38.9*   < > 28.9* 34.6* 40.0  MCV 90.1 90.7   < > 91.2 93.0 93.5  PLT 232 271   < > 419* 525* 443*   < > = values in this interval not displayed.   Lab Results  Component Value Date   TSH 0.913 07/17/2017   Lab Results  Component Value Date   HGBA1C 5.8 (H) 07/11/2017   Lab Results  Component Value Date   CHOL 93 04/02/2017   HDL 28 (L) 04/02/2017   LDLCALC 38 04/02/2017   TRIG 135 04/02/2017   CHOLHDL 3.3 04/02/2017    Significant Diagnostic Results in last 30 days:  Ct Pelvis W Contrast  Result Date: 09/27/2017 CLINICAL DATA:  Left buttock pain for 3 days. Evaluate for  left gluteal abscess. EXAM: CT PELVIS WITH CONTRAST TECHNIQUE: Multidetector CT imaging of the pelvis was performed using the standard protocol following the bolus administration of intravenous contrast. CONTRAST:  123mL ISOVUE-300 IOPAMIDOL (ISOVUE-300) INJECTION 61% COMPARISON:  09/24/2017 FINDINGS: Since the study of 3 days ago, there has been placement of a drainage catheter in the left buttock abscess. The left-sided collection is largely evacuated. There is some regional edematous change. Small fluid collection to the right-sided midline in the right fatty buttock persists, measuring about 2.8 cm in diameter. Small left posterior perirectal abscess persists, 2 cm in diameter. No new or progressive finding. No evidence of deep space infection. In the pelvis itself, there is no finding of note. IMPRESSION: Catheter drainage of a left buttock abscess. Some  persistent collection in the right buttock fatty tissues, measuring 2.8 cm in maximal diameter. Small persistent left posterior perirectal/perianal collection unchanged at 2 cm in diameter. Electronically Signed   By: Nelson Chimes M.D.   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  2.  Hypertension as noted above this appears to be stable on Lopressor.  3.  History of atrial fibrillation this appears to be rate controlled he is on Eliquis on exam today sounded like sinus rhythm.  4.  History of type 1 diabetes with complications-his P5T in December was 5.8 blood sugars are running quite decent in the early day.  Although somewhat low in the 70s and 80s at times later in the day I discussed this with Dr. Lyndel Safe and will decrease his Levemir down to 30 units and monitor--  #5-history of gluteal abscess status post incision and drainage with drains-the drains have come out this appears to be stable per review of recent surgery note-- he has completed his antibiotics white count has normalized  #6 history of PEG placement he appears to tolerate this wel-- getting his appetite improved continues to be a challenge on the dysphagia diet he has been started on Remeron-- weight appears to be stabilized but I suspect this will continue to be a challenge--will monitor for now with help from dietary.--He is on supplements. via  Tube  7.  History of status post craniotomy he has follow-up with Dr. Ditty-occasionally will complain of headaches but I believe these are improving somewhat.  8.  History of hypercalcemia he is followed by endocrinology he has received IV Reclast--this is thought to be due to immobility and again endocrinology has put him  on vitamin D and calcium carbonate-last calcium level was 9.5 on October 17, 2017  #9 history of constipation-he is on numerous agents including Colace twice a day as needed as well asDulcolax t as needed and MiraLAX once a day as needed--apparently this is stabilized.  10.  History of peripheral neuropathy again he is on Neurontin at night.  Of note for health screening will order hepatitis C screening-also arrange for an eye doctor to do a diabetic eye exam when Dr. is in the facility-will defer colonoscopy for now secondary to patient's rectal issues  [id number]-of note greater than 45 minutes spent assessing patient-reviewing his chart-reviewing his labs- discussing his status with nursing staff- and coordinating and formulating a plan of care for numerous diagnoses-of note greater than 50%  of time spent coordinating plan of care

## 2017-12-06 ENCOUNTER — Encounter (HOSPITAL_COMMUNITY)
Admission: RE | Admit: 2017-12-06 | Discharge: 2017-12-06 | Disposition: A | Discharge status: Home / Self Care | Payer: Medicaid Other | Source: Skilled Nursing Facility | Attending: Internal Medicine | Admitting: Internal Medicine

## 2017-12-06 DIAGNOSIS — I63311 Cerebral infarction due to thrombosis of right middle cerebral artery: Secondary | ICD-10-CM | POA: Insufficient documentation

## 2017-12-06 DIAGNOSIS — Z978 Presence of other specified devices: Secondary | ICD-10-CM | POA: Diagnosis present

## 2017-12-06 DIAGNOSIS — I10 Essential (primary) hypertension: Secondary | ICD-10-CM | POA: Diagnosis present

## 2017-12-06 DIAGNOSIS — Z5189 Encounter for other specified aftercare: Secondary | ICD-10-CM | POA: Diagnosis present

## 2017-12-06 LAB — LIPID PANEL
Cholesterol: 149 mg/dL (ref 0–200)
HDL: 27 mg/dL — ABNORMAL LOW (ref >40)
LDL CALC: 67 mg/dL (ref 0–99)
Total CHOL/HDL Ratio: 5.5 RATIO
Triglycerides: 276 mg/dL — ABNORMAL HIGH (ref <150)
VLDL: 55 mg/dL — ABNORMAL HIGH (ref 0–40)

## 2017-12-06 LAB — HEMOGLOBIN A1C
HEMOGLOBIN A1C: 4.5 % — AB (ref 4.8–5.6)
MEAN PLASMA GLUCOSE: 82.45 mg/dL

## 2017-12-25 ENCOUNTER — Non-Acute Institutional Stay (SKILLED_NURSING_FACILITY): Payer: Medicaid Other | Admitting: Internal Medicine

## 2017-12-25 ENCOUNTER — Encounter: Payer: Self-pay | Admitting: Internal Medicine

## 2017-12-25 DIAGNOSIS — Z9889 Other specified postprocedural states: Secondary | ICD-10-CM | POA: Diagnosis not present

## 2017-12-25 DIAGNOSIS — I480 Paroxysmal atrial fibrillation: Secondary | ICD-10-CM

## 2017-12-25 DIAGNOSIS — I10 Essential (primary) hypertension: Secondary | ICD-10-CM | POA: Diagnosis not present

## 2017-12-25 DIAGNOSIS — E108 Type 1 diabetes mellitus with unspecified complications: Secondary | ICD-10-CM | POA: Diagnosis not present

## 2017-12-25 DIAGNOSIS — I69354 Hemiplegia and hemiparesis following cerebral infarction affecting left non-dominant side: Secondary | ICD-10-CM | POA: Diagnosis not present

## 2017-12-25 NOTE — Progress Notes (Signed)
This is a routine visit.  Level care skilled.  Facility is [facility].  Chief complaint-routine visit for medical management of chronic medical conditions including his insulin dependent diabetes type 1-hypertension-history of CVA-history of decompression right frontotemporoparietal craniotomy.  Also history of PEG tube placement-hypercalcemia- atrial fibrillation-.  History of present illness.  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  he also had a decompressive right frontotemporal parietal craniotomy--4 decompression with a duraplasty this was done in August.  At one point he developed a gluteal muscle Abscess but this resolved with antibiotics required short hospitalization He continues with leftt-sided hemiparesis--- but is doing well with supportive care- he is in a new room and he really likes the change   Regards to diabetes he is a type I diabetic at one point he had diabetic ketoacidosis which resulted in hospitalization and subsequently had a CVA while being hospitalized.  He is currently on Levemir 22 units hemoglobin A1c earlier this month was 4.5- blood sugars appear to be largely in the lower 100s but I do note a 59 in hospital semi-frequent readings in the 80s-will reduce the Levemir somewhat.  Clinically he has no complaints   Past Medical History:  Diagnosis Date  . DM (diabetes mellitus) ([place])   . Hypertension   . Stroke [facility])         Past Surgical History:  Procedure Laterality Date  . CRANIECTOMY Right 04/01/2017   Procedure: RIGHT DECOMPRESSIVE CRANIECTOMY;  Surgeon: Ditty, Kevan Ny, MD;  Location: [place];  Service: Neurosurgery;   Laterality: Right;  . ESOPHAGOGASTRODUODENOSCOPY N/A 04/13/2017   Procedure: ESOPHAGOGASTRODUODENOSCOPY (EGD);  Surgeon: Georganna Skeans, MD;  Location: [place];  Service: General;  Laterality: N/A;  bedside  . INCISION AND DRAINAGE PERIRECTAL ABSCESS N/A 09/28/2017   Procedure: IRRIGATION AND DEBRIDEMENT PERIANAL ABSCESS;  Surgeon: Virl Cagey, MD;  Location: [facility];  Service: General;  Laterality: N/A;  . PEG PLACEMENT N/A 04/13/2017   Procedure: PERCUTANEOUS ENDOSCOPIC GASTROSTOMY (PEG) PLACEMENT;  Surgeon: Georganna Skeans, MD;  Location: [facility] ENDOSCOPY;  Service: General;  Laterality: N/A;        Allergies  Allergen Reactions  . Penicillins Hives               Medication Sig  . acetaminophen (TYLENOL) 325 MG tablet Place 650 mg into feeding tube every 4 (four) hours as needed.  . Amino Acids-Protein Hydrolys (FEEDING SUPPLEMENT, PRO-STAT SUGAR FREE 64,) LIQD Place 30 mLs into feeding tube daily. Per gastric tube   . apixaban (ELIQUIS) 5 MG TABS tablet Place 1 tablet (5 mg total) into feeding tube 2 (two) times daily.  . bisacodyl (DULCOLAX) 10 MG suppository Place 1 suppository (10 mg total) rectally daily as needed for moderate constipation.  . Cholecalciferol (VITAMIN D) 2000 units CAPS Take 1 capsule (2,000 Units total) by mouth daily.  . coal tar (NEUTROGENA T-GEL) 0.5 % shampoo Apply to scalp on shower days twice a week Mon., Thru  . docusate (COLACE) 50 MG/5ML liquid Place 10 mLs (100 mg total) into feeding tube 2 (two) times daily as needed for mild constipation.  . gabapentin (NEURONTIN) 300 MG capsule Take 1 capsule (300 mg total) by  mouth at bedtime.  . insulin detemir (LEVEMIR) 100 UNIT/ML injection Inject 22 Units into the skin daily.  . metoprolol tartrate (LOPRESSOR) 25 MG tablet Place 0.5 tablets (12.5 mg total) into feeding tube 2 (two) times daily.  . mirtazapine (REMERON) 15 MG tablet Take 7.5 mg by mouth at bedtime.  . Nutritional Supplements  (FEEDING SUPPLEMENT, GLUCERNA 1.2 CAL,) LIQD Place 300 mLs into feeding tube 4 (four) times daily.  . pantoprazole sodium (PROTONIX) 40 mg/20 mL PACK Place 20 mLs (40 mg total) into feeding tube daily.  . polyethylene glycol (MIRALAX / GLYCOLAX) packet Place 17 g into feeding tube daily as needed.  . traMADol (ULTRAM) 50 MG tablet Take 1 tablet (50 mg total) by mouth every 6 (six) hours as needed.  . Water For Irrigation, Sterile (FREE WATER) SOLN Place 300 mLs into feeding tube every 4 (four) hours. Keep HOB elevated at 45 degrees at all times during feedings, flushings, and medications 12a, 4a, 8a, 12p, 4p,8p  . [DISCONTINUED] chlorhexidine (PERIDEX) 0.12 % solution 15 mLs by Mouth Rinse route 2 (two) times daily.  . [DISCONTINUED] insulin detemir (LEVEMIR) 100 UNIT/ML injection Inject 0.34 mLs (34 Units total) into the skin daily.     Review of systems.  In general is not complaining any fever chills his weight is stable.  Skin is not complaining of rashes or itching.  Head ears eyes nose mouth and throat does not complain of sore throat or visual changes from baseline.  Respiratory does not complain of shortness of breath or cough.  Cardiac is not complaining of chest pain or significant lower extremity edema.  GI does not complain of abdominal discomfort nausea vomiting diarrhea constipation he does have a PEG tube and is tolerating feedings well.  GU does not complain of dysuria.  Musculoskeletal not complaining of joint pain he does have left-sided hemiparesis.  Neurologic again he does have a CVA with left-sided hemiparesis which persists does not complain of headache dizziness or syncope.  psych does not complain of overt anxiety or depression he appears to be quitecontent  with his new room and roommate   Today wife physical exam.  He is afebrile pulse 96 respirations of 17 blood pressure 130/80 appears relatively baseline weight is stable at 159.6 pounds in general this  is a pleasant middle-age male in no distress sitting comfortably in his wheelchair.  His skin is warm and dry  PEG site appears unremarkable.  Head-he still has evidence of craniotomy which appears well-healed right side  Eyes pupils appear reactive to light sclera and conjunctive are clear visual acuity appears at baseline.  Oropharynx is clear mucous membranes moist.  Chest clear to auscultation there is no labored breathing.  Heart is regular rate and rhythm without murmur gallop or rub he does not have significant lower extremity edema.  Abdomen is soft nontender with positive bowel sounds.  PEG site appears unremarkable without tenderness to palpation drainage or bleeding.  Musculoskeletal continues with left-sided hemiparalysis  Moves right upper and lower extremities at basel.  Neurologic --as noted above he is alert.  Psych he is alert and oriented pleasant and appropriate   Labs.  Dec 06, 2017.  Hbg A1c 4.5.  LDL was 67.  October 17, 2017.  Sodium 139 potassium 4.1 BUN 18 creatinine 0.57.  Calcium was 9.5 October 04, 2017.    Albumin 3.1 otherwise liver function test within normal limits   October 10, 2017.  WBC 8.5 hemoglobin 11.6 platelets 443  Assessment and plan.  1.  History of insulin-dependent diabetes type 1- continues on Levemir 22 units every morning-blood sugars at times will run somewhat low will reduce her Levemir down to 17 units and monitor hemoglobin A1c was 4.5 on lab done earlier this month.  2.  Hypertension this appears stable on Lopressor blood pressure today is 130/80 this appears relatively baseline with previous values per discussion with nursing.  3.  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  He will need follow-up by neurology.  LDL on most recent lab was 67 goal is to keep LDL less  than 70  4.  History of decompression right frontal temporoparietal craniotomy with decompression and duraplasty-this appears to be stable he does not appear to complain of pain with this will monitor.  5.  History of dysphasia with PEG tube placement again his p.o. intake has improved tube feedings have been reduced he is on Remeron for appetite stimulation his weight has been stable he is also on dietary supplementation.  History of constipation he continues on Colace  6.  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 Dorris Fetch endocrinology last calcium was 9.3 he did receive  IV Reclast--we will update a metabolic panel to ensure stability here.  7.  History of gluteal abscess again this has largely resolved he did require hospitalization there is been no reoccurrence.  8.  History of atrial fibrillation this appears stable and rate controlled on beta-blocker he is on Eliquis t for anticoagulation.  9.-  History of constipation he is on Dulcolax and Colace as needed apparently this is stabilized with no recent complaints.  10.  History of vitamin D deficiency he is on supplementation this is being followed by endocrinology as well  [id number]-of note greater than 35 minutes spent assessing patient reviewing his chart and labs discussing his status with nursing staff- coordinating and formulating a plan of care for numerous diagnoses- note greater than 50% of time spent coordinating plan of care

## 2017-12-26 ENCOUNTER — Encounter (HOSPITAL_COMMUNITY)
Admission: RE | Admit: 2017-12-26 | Discharge: 2017-12-26 | Disposition: A | Discharge status: Home / Self Care | Payer: Medicaid Other | Source: Skilled Nursing Facility | Attending: Internal Medicine | Admitting: Internal Medicine

## 2017-12-26 DIAGNOSIS — Z5189 Encounter for other specified aftercare: Secondary | ICD-10-CM | POA: Diagnosis present

## 2017-12-26 DIAGNOSIS — I10 Essential (primary) hypertension: Secondary | ICD-10-CM | POA: Diagnosis present

## 2017-12-26 DIAGNOSIS — I63311 Cerebral infarction due to thrombosis of right middle cerebral artery: Secondary | ICD-10-CM | POA: Insufficient documentation

## 2017-12-26 DIAGNOSIS — Z978 Presence of other specified devices: Secondary | ICD-10-CM | POA: Insufficient documentation

## 2017-12-26 LAB — BASIC METABOLIC PANEL
Anion gap: 7 (ref 5–15)
BUN: 17 mg/dL (ref 6–20)
CALCIUM: 10 mg/dL (ref 8.9–10.3)
CO2: 27 mmol/L (ref 22–32)
CREATININE: 0.7 mg/dL (ref 0.61–1.24)
Chloride: 105 mmol/L (ref 101–111)
GFR calc Af Amer: >60 mL/min (ref >60)
GLUCOSE: 91 mg/dL (ref 65–99)
Potassium: 4.2 mmol/L (ref 3.5–5.1)
Sodium: 139 mmol/L (ref 135–145)

## 2017-12-26 LAB — CBC WITH DIFFERENTIAL/PLATELET
Basophils Absolute: 0.1 K/uL (ref 0.0–0.1)
Basophils Relative: 1 %
Eosinophils Absolute: 0.4 K/uL (ref 0.0–0.7)
Eosinophils Relative: 4 %
HCT: 47.8 % (ref 39.0–52.0)
Hemoglobin: 14.5 g/dL (ref 13.0–17.0)
Lymphocytes Relative: 38 %
Lymphs Abs: 3.8 K/uL (ref 0.7–4.0)
MCH: 27 pg (ref 26.0–34.0)
MCHC: 30.3 g/dL (ref 30.0–36.0)
MCV: 89 fL (ref 78.0–100.0)
Monocytes Absolute: 0.8 K/uL (ref 0.1–1.0)
Monocytes Relative: 8 %
Neutro Abs: 5 K/uL (ref 1.7–7.7)
Neutrophils Relative %: 49 %
Platelets: 174 K/uL (ref 150–400)
RBC: 5.37 MIL/uL (ref 4.22–5.81)
RDW: 14.6 % (ref 11.5–15.5)
WBC: 10 K/uL (ref 4.0–10.5)

## 2018-01-01 ENCOUNTER — Encounter: Payer: Self-pay | Admitting: Internal Medicine

## 2018-01-19 ENCOUNTER — Other Ambulatory Visit: Payer: Self-pay

## 2018-01-19 ENCOUNTER — Encounter: Payer: Self-pay | Admitting: Internal Medicine

## 2018-01-19 ENCOUNTER — Encounter (HOSPITAL_COMMUNITY): Payer: Self-pay

## 2018-01-19 ENCOUNTER — Emergency Department (HOSPITAL_COMMUNITY): Payer: Medicaid Other

## 2018-01-19 ENCOUNTER — Non-Acute Institutional Stay (SKILLED_NURSING_FACILITY): Payer: Medicaid Other | Admitting: Internal Medicine

## 2018-01-19 ENCOUNTER — Emergency Department (HOSPITAL_COMMUNITY)
Admission: EM | Admit: 2018-01-19 | Discharge: 2018-01-19 | Disposition: A | Discharge status: Home / Self Care | Payer: Medicaid Other | Attending: Emergency Medicine | Admitting: Emergency Medicine

## 2018-01-19 ENCOUNTER — Inpatient Hospital Stay
Admission: RE | Admit: 2018-01-19 | Discharge: 2020-03-28 | Disposition: A | Discharge status: Other Acute Inpatient Hospital | Payer: Medicaid Other | Source: Ambulatory Visit | Attending: Internal Medicine | Admitting: Internal Medicine

## 2018-01-19 DIAGNOSIS — E109 Type 1 diabetes mellitus without complications: Secondary | ICD-10-CM | POA: Diagnosis not present

## 2018-01-19 DIAGNOSIS — R42 Dizziness and giddiness: Secondary | ICD-10-CM | POA: Diagnosis present

## 2018-01-19 DIAGNOSIS — Z9889 Other specified postprocedural states: Secondary | ICD-10-CM | POA: Diagnosis not present

## 2018-01-19 DIAGNOSIS — R519 Headache, unspecified: Secondary | ICD-10-CM

## 2018-01-19 DIAGNOSIS — I10 Essential (primary) hypertension: Secondary | ICD-10-CM | POA: Insufficient documentation

## 2018-01-19 DIAGNOSIS — I69354 Hemiplegia and hemiparesis following cerebral infarction affecting left non-dominant side: Secondary | ICD-10-CM

## 2018-01-19 DIAGNOSIS — Z79899 Other long term (current) drug therapy: Secondary | ICD-10-CM | POA: Insufficient documentation

## 2018-01-19 DIAGNOSIS — G4489 Other headache syndrome: Secondary | ICD-10-CM

## 2018-01-19 DIAGNOSIS — Z7901 Long term (current) use of anticoagulants: Secondary | ICD-10-CM | POA: Diagnosis not present

## 2018-01-19 DIAGNOSIS — Z794 Long term (current) use of insulin: Secondary | ICD-10-CM | POA: Diagnosis not present

## 2018-01-19 DIAGNOSIS — Z8673 Personal history of transient ischemic attack (TIA), and cerebral infarction without residual deficits: Principal | ICD-10-CM

## 2018-01-19 DIAGNOSIS — R51 Headache: Secondary | ICD-10-CM | POA: Insufficient documentation

## 2018-01-19 DIAGNOSIS — I959 Hypotension, unspecified: Secondary | ICD-10-CM | POA: Diagnosis not present

## 2018-01-19 DIAGNOSIS — Z87891 Personal history of nicotine dependence: Secondary | ICD-10-CM | POA: Insufficient documentation

## 2018-01-19 LAB — CBC WITH DIFFERENTIAL/PLATELET
BASOS ABS: 0.1 10*3/uL (ref 0.0–0.1)
BASOS PCT: 1 %
EOS PCT: 4 %
Eosinophils Absolute: 0.5 10*3/uL (ref 0.0–0.7)
HCT: 47.9 % (ref 39.0–52.0)
Hemoglobin: 14.4 g/dL (ref 13.0–17.0)
Lymphocytes Relative: 34 %
Lymphs Abs: 3.7 10*3/uL (ref 0.7–4.0)
MCH: 27 pg (ref 26.0–34.0)
MCHC: 30.1 g/dL (ref 30.0–36.0)
MCV: 89.7 fL (ref 78.0–100.0)
Monocytes Absolute: 0.8 10*3/uL (ref 0.1–1.0)
Monocytes Relative: 7 %
Neutro Abs: 6 10*3/uL (ref 1.7–7.7)
Neutrophils Relative %: 54 %
PLATELETS: 198 10*3/uL (ref 150–400)
RBC: 5.34 MIL/uL (ref 4.22–5.81)
RDW: 14.6 % (ref 11.5–15.5)
WBC: 10.9 10*3/uL — AB (ref 4.0–10.5)

## 2018-01-19 LAB — BASIC METABOLIC PANEL
Anion gap: 6 (ref 5–15)
BUN: 15 mg/dL (ref 6–20)
CALCIUM: 10.1 mg/dL (ref 8.9–10.3)
CO2: 29 mmol/L (ref 22–32)
CREATININE: 0.8 mg/dL (ref 0.61–1.24)
Chloride: 108 mmol/L (ref 101–111)
GFR calc Af Amer: >60 mL/min (ref >60)
Glucose, Bld: 88 mg/dL (ref 65–99)
POTASSIUM: 4.6 mmol/L (ref 3.5–5.1)
SODIUM: 143 mmol/L (ref 135–145)

## 2018-01-19 MED ORDER — SODIUM CHLORIDE 0.9 % IV SOLN
INTRAVENOUS | Status: DC
  Administered 2018-01-19: 18:00:00 via INTRAVENOUS

## 2018-01-19 MED ORDER — FENTANYL CITRATE (PF) 100 MCG/2ML IJ SOLN
50.0000 ug | Freq: Once | INTRAMUSCULAR | Status: AC
  Administered 2018-01-19: 50 ug via INTRAVENOUS
  Filled 2018-01-19: qty 2

## 2018-01-19 MED ORDER — ONDANSETRON HCL 4 MG/2ML IJ SOLN
4.0000 mg | Freq: Once | INTRAMUSCULAR | Status: AC
  Administered 2018-01-19: 4 mg via INTRAVENOUS
  Filled 2018-01-19: qty 2

## 2018-01-19 NOTE — ED Provider Notes (Addendum)
[facility] EMERGENCY DEPARTMENT Provider Note   CSN: [id number] Arrival date & time: 01/19/18  [place]     History   Chief Complaint Chief Complaint  Patient presents with  . Dizziness    HPI Joe Wilkinson is a 57 y.o. male.  HPI   Patient presents from his skilled nursing facility where he was in usual state of health until this morning when he began to have a headache and dizziness.  Caregivers noticed that the right side of his head was more swollen than usual.  This is a location where he has the pain.  The patient is status post craniotomy, for "stroke," about 9 months ago.  Resides in a skilled nursing facility for care.  He has dense left-sided hemiplegia.  He is able to communicate well and use the right side of his body.  He denies fever, chills, cough, chest pain, blurred vision, new weakness or paresthesia.  No prior similar problem.  There are no other known modifying factors.  Past Medical History:  Diagnosis Date  . DM (diabetes mellitus) ([place])   . Hypertension   . Stroke [facility])     Patient Active Problem List   Diagnosis Date Noted  . Gluteal abscess 10/03/2017  . Benign essential HTN   . Hemiparesis affecting left side as late effect of cerebrovascular accident (CVA) ([place])   . Abscess, perianal   . Dislodged gastrostomy tube ([place])   . Sepsis ([place]) 09/24/2017  . Vitamin D deficiency 09/13/2017  . 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  . S/P percutaneous endoscopic gastrostomy (PEG) tube placement ([place]) 07/14/2017  . Type 1 diabetes mellitus with complications ([place]) 40/34/7425  . Acute blood loss anemia 04/27/2017  . Headache 04/27/2017  . AF (paroxysmal atrial fibrillation) ([place]) 04/27/2017  . Acute respiratory failure ([place])   . Cytotoxic cerebral edema ([place]) 04/01/2017  . DKA (diabetic ketoacidoses) ([place]) 03/30/2017    Past Surgical History:  Procedure Laterality Date  . CRANIECTOMY Right 04/01/2017   Procedure: RIGHT DECOMPRESSIVE CRANIECTOMY;  Surgeon: Ditty, Kevan Ny, MD;  Location: [place];  Service: Neurosurgery;  Laterality: Right;  . ESOPHAGOGASTRODUODENOSCOPY N/A 04/13/2017   Procedure: ESOPHAGOGASTRODUODENOSCOPY (EGD);  Surgeon: Georganna Skeans, MD;  Location: [place];  Service: General;  Laterality: N/A;  bedside  . INCISION AND DRAINAGE PERIRECTAL ABSCESS N/A 09/28/2017   Procedure: IRRIGATION AND DEBRIDEMENT PERIANAL ABSCESS;  Surgeon: Virl Cagey, MD;  Location: [facility];  Service: General;  Laterality: N/A;  . PEG PLACEMENT N/A 04/13/2017   Procedure: PERCUTANEOUS ENDOSCOPIC GASTROSTOMY (PEG) PLACEMENT;  Surgeon: Georganna Skeans, MD;  Location: [facility] ENDOSCOPY;  Service: General;  Laterality: N/A;        Home Medications    Prior to Admission medications   Medication Sig Start Date End Date Taking? Authorizing Provider  acetaminophen (TYLENOL) 325 MG tablet Place 650 mg into feeding tube every 4 (four) hours as needed.   Yes [provider]  Amino Acids-Protein Hydrolys (FEEDING SUPPLEMENT, PRO-STAT SUGAR FREE 64,) LIQD Place 30 mLs into feeding tube 2 (two) times daily between meals. Per gastric tube    Yes [provider]  apixaban (ELIQUIS) 5 MG TABS tablet Place 1 tablet (5 mg total) into feeding tube 2 (two) times daily. 05/29/17  Yes Mikhail, Velta Addison, DO  bisacodyl (DULCOLAX) 10 MG suppository Place 1 suppository (10 mg total) rectally daily as needed for moderate constipation. 05/29/17  Yes Mikhail, Velta Addison, DO  Cholecalciferol (VITAMIN D) 2000 units CAPS Take 1  capsule (2,000 Units total) by mouth daily. Patient taking differently: Place 2 capsules into feeding tube daily.  11/08/17  Yes Nida, Marella Chimes, MD  coal tar (NEUTROGENA T-GEL) 0.5 % shampoo Apply 1 application topically See admin instructions. Apply to scalp on shower days twice a week Mon and [employer]   Yes [provider]  docusate (COLACE) 50 MG/5ML liquid Place 10 mLs (100  mg total) into feeding tube 2 (two) times daily as needed for mild constipation. 05/29/17  Yes Mikhail, Maryann, DO  gabapentin (NEURONTIN) 100 MG capsule 200 mg at bedtime. Feeding tube   Yes [provider]  insulin detemir (LEVEMIR) 100 UNIT/ML injection Inject 17 Units into the skin daily.    Yes [provider]  metoprolol tartrate (LOPRESSOR) 25 MG tablet Place 0.5 tablets (12.5 mg total) into feeding tube 2 (two) times daily. 05/29/17  Yes Mikhail, Maryann, DO  mirtazapine (REMERON) 15 MG tablet Place 7.5 mg into feeding tube at bedtime.    Yes [provider]  pantoprazole sodium (PROTONIX) 40 mg/20 mL PACK Place 20 mLs (40 mg total) into feeding tube daily. 05/29/17  Yes Mikhail, [place], DO  polyethylene glycol (MIRALAX / GLYCOLAX) packet Place 17 g into feeding tube daily as needed. Patient taking differently: Place 17 g into feeding tube daily as needed for mild constipation.  09/30/17  Yes Barton Dubois, MD  Water For Irrigation, Sterile (FREE WATER) SOLN Place 300 mLs into feeding tube every 4 (four) hours. Keep HOB elevated at 45 degrees at all times during feedings, flushings, and medications 12a, 4a, 8a, 12p, 4p,8p   Yes [provider]  Nutritional Supplements (FEEDING SUPPLEMENT, GLUCERNA 1.2 CAL,) LIQD Place 300 mLs into feeding tube 4 (four) times daily. 09/30/17   Barton Dubois, MD    Family History No family history on file.  Social History Social History   Tobacco Use  . Smoking status: Former Smoker    Types: Cigarettes  . Smokeless tobacco: Never Used  . Tobacco comment: UTA  Substance Use Topics  . Alcohol use: No    Comment: UTA  . Drug use: No    Comment: UTA     Allergies   Cheese and Penicillins   Review of Systems Review of Systems  All other systems reviewed and are negative.    Physical Exam Updated Vital Signs BP 111/75   Pulse 96   Temp 98.2 F (36.8 C) (Oral)   Resp 18   Ht 5\' 6"  (1.676 m)   Wt  68 kg (150 lb)   SpO2 96%   BMI 24.21 kg/m   Physical Exam  Constitutional: He is oriented to person, place, and time. He appears well-developed and well-nourished.  HENT:  Head: Normocephalic and atraumatic.  Right Ear: External ear normal.  Left Ear: External ear normal.  Eyes: Pupils are equal, round, and reactive to light. Conjunctivae and EOM are normal.  Neck: Normal range of motion and phonation normal. Neck supple.  Cardiovascular: Normal rate, regular rhythm and normal heart sounds.  Pulmonary/Chest: Effort normal and breath sounds normal. He exhibits no bony tenderness.  Abdominal: Soft. There is no tenderness.  Musculoskeletal:  Flexion contracture left arm.  Atrophic left leg.  Neurological: He is alert and oriented to person, place, and time.  Left hemiplegia.  Alert, lucid, cooperative.  Skin: Skin is warm, dry and intact.  Psychiatric: He has a normal mood and affect. His behavior is normal. Judgment and thought content normal.  Nursing note and  vitals reviewed.    ED Treatments / Results  Labs (all labs ordered are listed, but only abnormal results are displayed) Labs Reviewed  CBC WITH DIFFERENTIAL/PLATELET - Abnormal; Notable for the following components:      Result Value   WBC 10.9 (*)    All other components within normal limits  BASIC METABOLIC PANEL    EKG None  Radiology Ct Head Wo Contrast  Result Date: 01/19/2018 CLINICAL DATA:  Neuro deficits.  Increased swelling head. EXAM: CT HEAD WITHOUT CONTRAST TECHNIQUE: Contiguous axial images were obtained from the base of the skull through the vertex without intravenous contrast. COMPARISON:  05/08/2017 FINDINGS: Brain: Large territory chronic right MCA infarct with encephalomalacia and water density brain. Compensatory enlargement of the right lateral ventricle. Craniectomy defect with protruding brain and dura through the craniectomy defect. Midline shift slightly to the right due to volume loss on the  right. No acute hemorrhage.  No acute infarct or mass. Vascular: Negative for hyperdense vessel Skull: Large right-sided craniectomy Sinuses/Orbits: Mild mucosal edema paranasal sinuses. Negative orbit Other: None IMPRESSION: Large territory chronic right MCA infarct. Craniectomy for decompression. Infarcted brain and dura protrude through the craniectomy defect. No mass-effect or acute hemorrhage. Electronically Signed   By: Franchot Gallo M.D.   On: 01/19/2018 19:16    Procedures Procedures (including critical care time)  Medications Ordered in ED Medications  0.9 %  sodium chloride infusion ( Intravenous New Bag/Given 01/19/18 1809)  fentaNYL (SUBLIMAZE) injection 50 mcg (50 mcg Intravenous Given 01/19/18 1810)  ondansetron (ZOFRAN) injection 4 mg (4 mg Intravenous Given 01/19/18 1810)     Initial Impression / Assessment and Plan / ED Course  I have reviewed the triage vital signs and the nursing notes.  Pertinent labs & imaging results that were available during my care of the patient were reviewed by me and considered in my medical decision making (see chart for details).  Clinical Course as of Jan 20 1955  Fri Jan 19, 2018  1955 Normal  Basic metabolic panel [EW]  2952 Normal except minimal white count elevation  CBC with Differential(!) [EW]  1955 Evolutionary changes of right brain infarct, with water density brain, and enlarged right lateral ventricle.  Very minimal right-sided shift, indicating volume loss effect.  CT Head Wo Contrast [EW]    Clinical Course User Index [EW] Daleen Bo, MD     Patient Vitals for the past 24 hrs:  BP Temp Temp src Pulse Resp SpO2 Height Weight  01/19/18 1830 111/75 - - 96 - 96 % - -  01/19/18 1700 115/84 - - - - - - -  01/19/18 1655 104/79 98.2 F (36.8 C) Oral 86 18 98 % 5\' 6"  (1.676 m) 68 kg (150 lb)  01/19/18 1651 - - - - - - 5\' 6"  (1.676 m) 70.8 kg (156 lb)    7:51 PM Reevaluation with update and discussion. After initial  assessment and treatment, an updated evaluation reveals patient is more comfortable now and states his headache has resolved.  No change in his physical exam.  Findings discussed with the patient. Daleen Bo   Medical Decision Making: Nonspecific headache, status post craniotomy for complications of massive right MCA infarct.  Patient's brain has gone through evolutionary changes, now consisting of water density, with very minimal right sided shift.  This appears to be chronically stable without significant acute changes which would require intervention.  Patient can be evaluated further expectantly by neurosurgery if needed for persistent symptoms or  other concerns.  CRITICAL CARE-no Performed by: Daleen Bo   Nursing Notes Reviewed/ Care Coordinated Applicable Imaging Reviewed Interpretation of Laboratory Data incorporated into ED treatment  The patient appears reasonably screened and/or stabilized for discharge and I doubt any other medical condition or other [facility] requiring further screening, evaluation, or treatment in the ED at this time prior to discharge.  Plan: Home Medications-continue usual; Home Treatments-rest, fluids; return here if the recommended treatment, does not improve the symptoms; Recommended follow up-PCP, as needed     Final Clinical Impressions(s) / ED Diagnoses   Final diagnoses:  Nonintractable headache, unspecified chronicity pattern, unspecified headache type  Status post craniotomy  Status post stroke    ED Discharge Orders    None       Daleen Bo, MD 01/19/18 Karl Bales    Daleen Bo, MD 01/19/18 1956

## 2018-01-19 NOTE — Discharge Instructions (Addendum)
There were no acute complications to explain the swelling and pain.  It is safe to continue your usual treatments, and use the pain medicine, recommended by your PCP for further care.  We recommend that you follow-up with your neurosurgeon, Dr. Cyndy Freeze, for persistent pain, changes in mental status or other concerns, related to craniotomy and stroke.

## 2018-01-19 NOTE — ED Triage Notes (Signed)
Pt brought from [facility] by EMS. Per [place], pt c/o dizziness today. Pt with history of craniotomy with R side of head noted to be more swollen today, no trauma noted. Pt c/o headache which started today.

## 2018-01-19 NOTE — Progress Notes (Signed)
Location:   [place] Room Number: 104/W Place of Service:  SNF [phone]) Provider:  Freddi Starr, MD  Patient Care Team: Virgie Dad, MD as PCP - General (Internal Medicine)  Extended Emergency Contact Information Primary Emergency Contact: Theresia Bough [place] Mobile Phone: [phone] Relation: Sister Secondary Emergency Contact: Nickie Retort [place] Mobile Phone: [phone] Relation: Aunt  Code Status:  Full Code Goals of care: Advanced Directive information Advanced Directives 01/19/2018  Does Patient Have a Medical Advance Directive? Yes  Type of Advance Directive (No Data)  Does patient want to make changes to medical advance directive? No - Patient declined  Would patient like information on creating a medical advance directive? No - Patient declined     Chief Complaint  Patient presents with  . Acute Visit    Patient being seen for Change in Status w/history of CVA    HPI:  Joe Wilkinson is a 57 y.o. male seen today for an acute visit for  A change in status-with history previously of CVA.  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  Apparently patient was in his usual state and doing well but late this afternoon it was noted by some staff members that the craniotomy site appeared to have a change in appearance and position  He was also complaining of increased dizziness and headache.  Vital  signs appear to be stable except for blood pressure which was very difficult to obtain.  However he did not appear overtly symptomatic with syncope or change in mental status.  He is on Lopressor with a history of atrial fibrillation  His blood sugar of note was 91-O2 saturation was 88% but his hands were quite cold.  He is not complaining of chest pain or  diffulty breathing but is complaining of a significant headache as well as some dizziness  Past Medical History:  Diagnosis Date  . DM (diabetes mellitus) ([place])   . Hypertension   . Stroke [facility])    Past Surgical History:  Procedure Laterality Date  . CRANIECTOMY Right 04/01/2017   Procedure: RIGHT DECOMPRESSIVE CRANIECTOMY;  Surgeon: Ditty, Kevan Ny, MD;  Location: [place];  Service: Neurosurgery;  Laterality: Right;  . ESOPHAGOGASTRODUODENOSCOPY N/A 04/13/2017   Procedure: ESOPHAGOGASTRODUODENOSCOPY (EGD);  Surgeon: Georganna Skeans, MD;  Location: [place];  Service: General;  Laterality: N/A;  bedside  . INCISION AND DRAINAGE PERIRECTAL ABSCESS N/A 09/28/2017   Procedure: IRRIGATION AND DEBRIDEMENT PERIANAL ABSCESS;  Surgeon: Virl Cagey, MD;  Location: [facility];  Service: General;  Laterality: N/A;  . PEG PLACEMENT N/A 04/13/2017   Procedure: PERCUTANEOUS ENDOSCOPIC GASTROSTOMY (PEG) PLACEMENT;  Surgeon: Georganna Skeans, MD;  Location: [facility] ENDOSCOPY;  Service: General;  Laterality: N/A;    Allergies  Allergen Reactions  . Penicillins Hives    Outpatient Encounter Medications as of 01/19/2018  Medication Sig  . acetaminophen (TYLENOL) 325 MG tablet Place 650 mg into feeding tube every 4 (four) hours as needed.  . Amino Acids-Protein Hydrolys (FEEDING  SUPPLEMENT, PRO-STAT SUGAR FREE 64,) LIQD Place 30 mLs into feeding tube daily. Per gastric tube   . apixaban (ELIQUIS) 5 MG TABS tablet Place 1 tablet (5 mg total) into feeding tube 2 (two) times daily.  . bisacodyl (DULCOLAX) 10 MG suppository Place 1 suppository (10 mg  total) rectally daily as needed for moderate constipation.  . Cholecalciferol (VITAMIN D) 2000 units CAPS Take 1 capsule (2,000 Units total) by mouth daily.  . coal tar (NEUTROGENA T-GEL) 0.5 % shampoo Apply to scalp on shower days twice a week Mon., Thru  . docusate (COLACE) 50 MG/5ML liquid Place 10 mLs (100 mg total) into feeding tube 2 (two) times daily as needed for mild constipation.  . gabapentin (NEURONTIN) 300 MG capsule Take 1 capsule (300 mg total) by mouth at bedtime.  . insulin detemir (LEVEMIR) 100 UNIT/ML injection Inject 27 Units into the skin daily.  . metoprolol tartrate (LOPRESSOR) 25 MG tablet Place 0.5 tablets (12.5 mg total) into feeding tube 2 (two) times daily.  . mirtazapine (REMERON) 15 MG tablet Take 7.5 mg by mouth at bedtime.  . Nutritional Supplements (FEEDING SUPPLEMENT, GLUCERNA 1.2 CAL,) LIQD Place 300 mLs into feeding tube 4 (four) times daily.  . pantoprazole sodium (PROTONIX) 40 mg/20 mL PACK Place 20 mLs (40 mg total) into feeding tube daily.  . polyethylene glycol (MIRALAX / GLYCOLAX) packet Place 17 g into feeding tube daily as needed.  . Water For Irrigation, Sterile (FREE WATER) SOLN Place 300 mLs into feeding tube every 4 (four) hours. Keep HOB elevated at 45 degrees at all times during feedings, flushings, and medications 12a, 4a, 8a, 12p, 4p,8p  . [DISCONTINUED] traMADol (ULTRAM) 50 MG tablet Take 1 tablet (50 mg total) by mouth every 6 (six) hours as needed.   No facility-administered encounter medications on file as of 01/19/2018.     Review of Systems   General does not really complain of fever chills.  Skin he is not complaining of diaphoresis or itching.  Head ears eyes nose mouth and throat is not complaining of visual changes from baseline or sore throat.  Respiratory does not complain of shortness of breath or cough.  Cardiac denies chest pain.  GI is complaining of some mild tenderness at PEG site but I do not believe this is a new  complaint.  Is not really complaining of abdominal discomfort-nursings not noted nausea or vomiting.  GU-is not complaining of dysuria.  Musculoskeletal --does not really complain of joint pain.  Neurologic is complaining of a headache with some dizziness does not complain of syncope or numbness.  Psych does not appear overtly anxious or depressed appears to be at baseline   at There is no immunization history on file for this patient. Pertinent  Health Maintenance Due  Topic Date Due  . OPHTHALMOLOGY EXAM  02/18/2018 (Originally 04/06/1971)  . URINE MICROALBUMIN  02/18/2018 (Originally 04/06/1971)  . INFLUENZA VACCINE  03/08/2018  . HEMOGLOBIN A1C  06/08/2018  . FOOT EXAM  08/24/2018  . COLONOSCOPY  Discontinued   Fall Risk  10/10/2017 09/13/2017 09/04/2017  Falls in the past year? No No No  Risk for fall due to : - Impaired balance/gait;Impaired mobility;Medication side effect;Mental status change -   Functional Status Survey:    Vitals:   01/19/18 1629  Pulse: 94  Resp: 18  Temp: 98.1 F (36.7 C)  TempSrc: Oral  Blood pressure was very difficult to obtain-  Physical Exam   In general pleasant middle-age male who does  not appear to be in any acute distress  --is alert   skin is warm and dry.  Eyes visual acuity appears to be at baseline  .  Oropharynx--membranes are moist --appears to move tongue at baseline  Head-right-sided deformity site  of craniectomy appears to have changed some position and possibly slightly larger than previous exams- per nursing which sees this daily this has changed in appearance  Chest is clear to auscultation with somewhat poor respiratory effort   Heart is regular rate and rhythm no gallop or rub  Appears to possibly have some Jugular vein distention  Abdomen is soft with slightly hypoactive bowel sounds PEG tube is in place.  Muscle skeletal-- has left-sided hemiparalysis at baseline this appears unchanged-he does have his left  arm  on a support  Is able to move right-sided extremities   Psych he is alert oriented and appropriate which is his baseline       labs reviewed: Recent Labs    04/12/17 0151  09/05/17 0709 09/24/17 1527  09/25/17 0545  10/10/17 0714 10/17/17 0710 12/26/17 0700  NA 139   < >  --  140   < >  --    < > 133* 139 139  K 3.8   < >  --  4.2   < >  --    < > 4.7 4.1 4.2  CL 106   < >  --  103   < >  --    < > 101 106 105  CO2 26   < >  --  26   < >  --    < > 23 24 27   GLUCOSE 147*   < >  --  141*   < >  --    < > 102* 104* 91  BUN 15   < >  --  24*   < >  --    < > 19 18 17   CREATININE 0.75   < >  --  0.90   < >  --    < > 0.92 0.57* 0.70  CALCIUM 8.5*   < > 11.5* 9.2   < >  --    < > 9.5 9.5 10.0  MG 2.2  --  2.1  --   --  2.1  --   --   --   --   PHOS 4.4  --  3.7 2.2*  --   --   --   --   --   --    < > = values in this interval not displayed.   Recent Labs    09/24/17 1527 10/10/17 0714 10/17/17 0710  AST 34 22 16  ALT 59 46 35  ALKPHOS 102 53 49  BILITOT 1.0 0.3 0.4  PROT 8.3* 7.6 6.8  ALBUMIN 2.6* 3.2* 3.1*   Recent Labs    09/24/17 1527  10/02/17 0400 10/10/17 0714 12/26/17 0700  WBC 23.8*   < > 17.2* 8.5 10.0  NEUTROABS 19.0*  --   --  3.8 5.0  HGB 11.9*   < > 10.1* 11.6* 14.5  HCT 38.9*   < > 34.6* 40.0 47.8  MCV 90.7   < > 93.0 93.5 89.0  PLT 271   < > 525* 443* 174   < > = values in this interval not displayed.   Lab Results  Component Value Date   TSH 0.913 07/17/2017   Lab Results  Component Value Date  HGBA1C 4.5 (L) 12/06/2017   Lab Results  Component Value Date   CHOL 149 12/06/2017   HDL 27 (L) 12/06/2017   LDLCALC 67 12/06/2017   TRIG 276 (H) 12/06/2017   CHOLHDL 5.5 12/06/2017    Significant Diagnostic Results in last 30 days:  No results found.  Assessment/Plan  #1- change of status--CVA  History-- with possible change in appearance of craniectomy site- as well as associated headache and dizziness- also difficulty  obtaining blood pressure  We will send him to the ER for expedient evaluation-he does not appear to be in acute distress  But  per nursing the site has changed and he is now complaining of a fairly significant headache and dizziness  Of note when EMS arrived there also was difficulty obtaining blood pressure possibly found a systolic of 35 but unsure of accuracy-again he does not appear overtly symptomatic of severe hypotension --will warrant expedient evaluation.  [id number]

## 2018-01-22 ENCOUNTER — Telehealth: Payer: Self-pay | Admitting: Nurse Practitioner

## 2018-01-22 ENCOUNTER — Encounter: Payer: Self-pay | Admitting: Internal Medicine

## 2018-01-22 ENCOUNTER — Non-Acute Institutional Stay (SKILLED_NURSING_FACILITY): Payer: Medicaid Other | Admitting: Internal Medicine

## 2018-01-22 DIAGNOSIS — E108 Type 1 diabetes mellitus with unspecified complications: Secondary | ICD-10-CM

## 2018-01-22 DIAGNOSIS — R519 Headache, unspecified: Secondary | ICD-10-CM

## 2018-01-22 DIAGNOSIS — R51 Headache: Secondary | ICD-10-CM

## 2018-01-22 DIAGNOSIS — I10 Essential (primary) hypertension: Secondary | ICD-10-CM

## 2018-01-22 DIAGNOSIS — I69354 Hemiplegia and hemiparesis following cerebral infarction affecting left non-dominant side: Secondary | ICD-10-CM | POA: Diagnosis not present

## 2018-01-22 NOTE — Telephone Encounter (Signed)
Mavis/[facility]- [phone] called pt was seen at ED for HA (pls see ED notes). She is calling to schedule a f/u appt. There is nothing available in the next couple of weeks. Should this pt see Hoyle Sauer or Janett Billow?? Please call to schedule an appt

## 2018-01-22 NOTE — Telephone Encounter (Signed)
Spoke to [employer] with [facility].  Relayed from ED note, to see pcp, whom pt see at the facility.  Looking further, pt was seen by Dr. Cyndy Freeze, NS is [facility] (Mavis was asking).

## 2018-01-22 NOTE — Progress Notes (Signed)
This encounter was created in error - please disregard.

## 2018-01-22 NOTE — Progress Notes (Signed)
Location:   [place] Room Number: 104/W Place of Service:  SNF [phone]) Provider:  Freddi Starr, MD  Patient Care Team: Virgie Dad, MD as PCP - General (Internal Medicine)  Extended Emergency Contact Information Primary Emergency Contact: Theresia Bough [place] Mobile Phone: [phone] Relation: Sister Secondary Emergency Contact: Nickie Retort [place] Mobile Phone: [phone] Relation: Aunt  Code Status:  Full Code Goals of care: Advanced Directive information Advanced Directives 01/22/2018  Does Patient Have a Medical Advance Directive? Yes  Type of Advance Directive (No Data)  Does patient want to make changes to medical advance directive? No - Patient declined  Would patient like information on creating a medical advance directive? No - Patient declined     Chief complaint-acute visit follow-up ER visit for headache  HPI:  Pt is a 57 y.o. male seen today for an acute visit for for follow-up of an ER visit last Friday-.  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   Last Friday nursing staff thought that he had a change in the side of his craniectomy he was also complaining of a fairly severe headache and dizziness  It was also difficult to obtain his blood pressure  For evaluation  Work-up there was reassuring CT scan showed only evolutionary changes of his CVA.  Blood work was  unremarkable- he was given fentanyl for his headache and apparently this was effective.  He has returned to the facility and says he is feeling better currently neurologically appears to be at baseline vital signs are stable.  Regards to blood pressure this actually was stable in the ER-apparently at times his blood pressure is just difficult to auscultate blood pressure today manually was 245/80- appears systolic in ER was around 110   Past Medical History:  Diagnosis Date  . DM (diabetes mellitus) ([place])   . Hypertension   . Stroke [facility])    Past Surgical History:  Procedure Laterality Date  . CRANIECTOMY Right 04/01/2017   Procedure: RIGHT DECOMPRESSIVE CRANIECTOMY;  Surgeon: Ditty, Kevan Ny, MD;  Location: [place];  Service: Neurosurgery;  Laterality: Right;  . ESOPHAGOGASTRODUODENOSCOPY N/A 04/13/2017   Procedure: ESOPHAGOGASTRODUODENOSCOPY (EGD);  Surgeon: Georganna Skeans, MD;  Location: [place];  Service: General;  Laterality: N/A;  bedside  . INCISION AND DRAINAGE PERIRECTAL ABSCESS N/A 09/28/2017   Procedure: IRRIGATION AND DEBRIDEMENT PERIANAL ABSCESS;  Surgeon: Virl Cagey, MD;  Location: [facility];  Service: General;  Laterality: N/A;  . PEG PLACEMENT N/A 04/13/2017   Procedure: PERCUTANEOUS ENDOSCOPIC GASTROSTOMY (PEG) PLACEMENT;  Surgeon: Georganna Skeans, MD;  Location: [facility] ENDOSCOPY;  Service: General;  Laterality: N/A;    Allergies  Allergen Reactions  . Cheese   . Penicillins Hives    Has patient had a PCN reaction causing immediate rash, facial/tongue/throat swelling, SOB or lightheadedness with hypotension: Unknown Has patient had a PCN reaction causing severe rash involving mucus membranes or skin necrosis: Unknown Has patient had a PCN reaction that required hospitalization: No Has  patient had a PCN reaction occurring within the last 10 years: No If all of the above answers are "NO", then may proceed with Cephalosporin use.     Outpatient Encounter  Medications as of 01/22/2018  Medication Sig  . acetaminophen (TYLENOL) 325 MG tablet Place 650 mg into feeding tube every 4 (four) hours as needed.  . Amino Acids-Protein Hydrolys (FEEDING SUPPLEMENT, PRO-STAT SUGAR FREE 64,) LIQD Place 30 mLs into feeding tube 2 (two) times daily between meals. Per gastric tube   . apixaban (ELIQUIS) 5 MG TABS tablet Place 1 tablet (5 mg total) into feeding tube 2 (two) times daily.  . bisacodyl (DULCOLAX) 10 MG suppository Place 1 suppository (10 mg total) rectally daily as needed for moderate constipation.  . Cholecalciferol (VITAMIN D) 2000 units CAPS Take 1 capsule (2,000 Units total) by mouth daily.  . coal tar (NEUTROGENA T-GEL) 0.5 % shampoo Apply 1 application topically See admin instructions. Apply to scalp on shower days twice a week Mon and Thur  . docusate (COLACE) 50 MG/5ML liquid Place 10 mLs (100 mg total) into feeding tube 2 (two) times daily as needed for mild constipation.  . gabapentin (NEURONTIN) 100 MG capsule 200 mg at bedtime. Feeding tube  . insulin detemir (LEVEMIR) 100 UNIT/ML injection Inject 17 Units into the skin daily.   . metoprolol tartrate (LOPRESSOR) 25 MG tablet Place 0.5 tablets (12.5 mg total) into feeding tube 2 (two) times daily.  . mirtazapine (REMERON) 15 MG tablet Place 7.5 mg into feeding tube at bedtime.   . Nutritional Supplements (FEEDING SUPPLEMENT, GLUCERNA 1.2 CAL,) LIQD Place 300 mLs into feeding tube 4 (four) times daily.  . pantoprazole sodium (PROTONIX) 40 mg/20 mL PACK Place 20 mLs (40 mg total) into feeding tube daily.  . polyethylene glycol (MIRALAX / GLYCOLAX) packet Place 17 g into feeding tube daily as needed.  . Water For Irrigation, Sterile (FREE WATER) SOLN Place 300 mLs into feeding tube every 4 (four) hours. Keep HOB elevated at 45 degrees at all times during feedings, flushings, and medications 12a, 4a, 8a, 12p, 4p,8p   No facility-administered encounter medications on file as of 01/22/2018.      Review of Systems   In general is not complaining of any fever chills says he feels better.  Skin does not complain of rashes itching or diaphoresis.  Head ears eyes nose mouth and throat does not complain of visual changes or sore throat  Resp--does not complain shortness of breath or cough--  Cardiac does not complain of chest pain or palpitations or significant lower extremity edema.  GI does not complain of abdominal pain he is status post PEG tube.   Muscle Skeletal does not complain of joint pain does have left-sided hemiparalysis  Neurologic again he does have history CVA with left-sided hemiparalysis-he is not complaining of a headache or dizziness today he says this is improved.  Psych does not complain of being overtly anxious or depressed-appears to be in good spirits     There is no immunization history on file for this patient. Pertinent  Health Maintenance Due  Topic Date Due  . OPHTHALMOLOGY EXAM  02/18/2018 (Originally 04/06/1971)  . URINE MICROALBUMIN  02/18/2018 (Originally 04/06/1971)  . INFLUENZA VACCINE  03/08/2018  . HEMOGLOBIN A1C  06/08/2018  . FOOT EXAM  08/24/2018  . COLONOSCOPY  Discontinued   Fall Risk  10/10/2017 09/13/2017 09/04/2017  Falls in the past year? No No No  Risk for fall due to : - Impaired  balance/gait;Impaired mobility;Medication side effect;Mental status change -   Functional Status Survey:    Vitals:   01/22/18 1505  BP: (!) 138/96  Pulse: 88  Resp: 20  Temp: 97.8 F (36.6 C)  TempSrc: Oral  Of note manual blood pressure was 130/84   Physical Exam In general this is a pleasant middle-age male in no distress lying comfortably in bed   His skin is warm and dry.  Eyes visual acuity appears to be grossly intact- sclera and conjunctive are clear-   Oropharynx is clear mucous membranes moist.  Head-continues with right-sided deformity site of craniectomy- this appears relatively baseline today.  Chest is clear to  auscultation there is no labored breathing  g heart is regular rate and rhythm without murmur gallop or rub he does not have significant lower extremity edema  Abdomen is soft nontender with active bowel sounds PEG site appears unremarkable without drainage bleeding or sign of infection  Musculoskeletal continues with left-sided hemiparalysis--is able to move right-sided extremities at baseline   Neurologic as noted above he is alert sided hemiparesis at baseline right-sided strength appears preserved  d psych he appears grossly alert and oriented says he is feeling better    Labs reviewed: Recent Labs    04/12/17 0151  09/05/17 0709 09/24/17 1527  09/25/17 0545  10/17/17 0710 12/26/17 0700 01/19/18 1735  NA 139   < >  --  140   < >  --    < > 139 139 143  K 3.8   < >  --  4.2   < >  --    < > 4.1 4.2 4.6  CL 106   < >  --  103   < >  --    < > 106 105 108  CO2 26   < >  --  26   < >  --    < > 24 27 29   GLUCOSE 147*   < >  --  141*   < >  --    < > 104* 91 88  BUN 15   < >  --  24*   < >  --    < > 18 17 15   CREATININE 0.75   < >  --  0.90   < >  --    < > 0.57* 0.70 0.80  CALCIUM 8.5*   < > 11.5* 9.2   < >  --    < > 9.5 10.0 10.1  MG 2.2  --  2.1  --   --  2.1  --   --   --   --   PHOS 4.4  --  3.7 2.2*  --   --   --   --   --   --    < > = values in this interval not displayed.   Recent Labs    09/24/17 1527 10/10/17 0714 10/17/17 0710  AST 34 22 16  ALT 59 46 35  ALKPHOS 102 53 49  BILITOT 1.0 0.3 0.4  PROT 8.3* 7.6 6.8  ALBUMIN 2.6* 3.2* 3.1*   Recent Labs    10/10/17 0714 12/26/17 0700 01/19/18 1735  WBC 8.5 10.0 10.9*  NEUTROABS 3.8 5.0 6.0  HGB 11.6* 14.5 14.4  HCT 40.0 47.8 47.9  MCV 93.5 89.0 89.7  PLT 443* 174 198   Lab Results  Component Value Date   TSH 0.913 07/17/2017   Lab Results  Component Value  Date   HGBA1C 4.5 (L) 12/06/2017   Lab Results  Component Value Date   CHOL 149 12/06/2017   HDL 27 (L) 12/06/2017   LDLCALC 67  12/06/2017   TRIG 276 (H) 12/06/2017   CHOLHDL 5.5 12/06/2017    Significant Diagnostic Results in last 30 days:  Ct Head Wo Contrast  Result Date: 01/19/2018 CLINICAL DATA:  Neuro deficits.  Increased swelling head. EXAM: CT HEAD WITHOUT CONTRAST TECHNIQUE: Contiguous axial images were obtained from the base of the skull through the vertex without intravenous contrast. COMPARISON:  05/08/2017 FINDINGS: Brain: Large territory chronic right MCA infarct with encephalomalacia and water density brain. Compensatory enlargement of the right lateral ventricle. Craniectomy defect with protruding brain and dura through the craniectomy defect. Midline shift slightly to the right due to volume loss on the right. No acute hemorrhage.  No acute infarct or mass. Vascular: Negative for hyperdense vessel Skull: Large right-sided craniectomy Sinuses/Orbits: Mild mucosal edema paranasal sinuses. Negative orbit Other: None IMPRESSION: Large territory chronic right MCA infarct. Craniectomy for decompression. Infarcted brain and dura protrude through the craniectomy defect. No mass-effect or acute hemorrhage. Electronically Signed   By: Franchot Gallo M.D.   On: 01/19/2018 19:16    Assessment/Plan  History of headache previous history CVA with left-sided hemiparalysis with history of decompression of right frontal temporal parietal cranio ectomy with decompression- again work-up in the ER was reassuring-CT scan did not show any acute changes he says the headache--he does have PRN Tylenol.  Of note-we are also ordering a neurosurgery consult for follow-up 2 hypotension-hypotension again he has somewhat difficult to auscultate a blood pressures but this appears to be stable he is on Lopressor.  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

## 2018-02-06 ENCOUNTER — Other Ambulatory Visit (HOSPITAL_COMMUNITY)
Admission: RE | Admit: 2018-02-06 | Discharge: 2018-02-06 | Disposition: A | Discharge status: Home / Self Care | Payer: Medicaid Other | Source: Skilled Nursing Facility | Attending: Internal Medicine | Admitting: Internal Medicine

## 2018-02-06 LAB — TSH: TSH: 1.524 u[IU]/mL (ref 0.350–4.500)

## 2018-02-09 ENCOUNTER — Ambulatory Visit: Payer: Medicaid Other | Admitting: "Endocrinology

## 2018-02-12 ENCOUNTER — Encounter: Payer: Self-pay | Admitting: "Endocrinology

## 2018-02-12 ENCOUNTER — Ambulatory Visit (INDEPENDENT_AMBULATORY_CARE_PROVIDER_SITE_OTHER): Payer: Medicaid Other | Admitting: "Endocrinology

## 2018-02-12 NOTE — Progress Notes (Signed)
Endocrinology follow-up note                                            02/12/2018, 11:00 AM   Subjective:    Patient ID: Joe Wilkinson, male    DOB: Feb 13, 1961, PCP Virgie Dad, MD   Past Medical History:  Diagnosis Date  . DM (diabetes mellitus) ([place])   . Hypertension   . Stroke [facility])    Past Surgical History:  Procedure Laterality Date  . CRANIECTOMY Right 04/01/2017   Procedure: RIGHT DECOMPRESSIVE CRANIECTOMY;  Surgeon: Ditty, Kevan Ny, MD;  Location: [place];  Service: Neurosurgery;  Laterality: Right;  . ESOPHAGOGASTRODUODENOSCOPY N/A 04/13/2017   Procedure: ESOPHAGOGASTRODUODENOSCOPY (EGD);  Surgeon: Georganna Skeans, MD;  Location: [place];  Service: General;  Laterality: N/A;  bedside  . INCISION AND DRAINAGE PERIRECTAL ABSCESS N/A 09/28/2017   Procedure: IRRIGATION AND DEBRIDEMENT PERIANAL ABSCESS;  Surgeon: Virl Cagey, MD;  Location: [facility];  Service: General;  Laterality: N/A;  . PEG PLACEMENT N/A 04/13/2017   Procedure: PERCUTANEOUS ENDOSCOPIC GASTROSTOMY (PEG) PLACEMENT;  Surgeon: Georganna Skeans, MD;  Location: [facility] ENDOSCOPY;  Service: General;  Laterality: N/A;   Social History   Socioeconomic History  . Marital status: Unknown    Spouse name: Not on file  . Number of children: Not on file  . Years of education: Not on file  . Highest education level: Not on file  Occupational History  . Not on file  Social Needs  . Financial resource strain: Not on file  . Food insecurity:    Worry: Not on file    Inability: Not on file  . Transportation needs:    Medical: Not on file    Non-medical: Not on file  Tobacco Use  . Smoking status: Former Smoker    Types: Cigarettes  . Smokeless tobacco: Never Used  . Tobacco comment: UTA  Substance and Sexual Activity  . Alcohol use: No    Comment: UTA  . Drug use: No    Comment: UTA  . Sexual activity: Not Currently    Birth control/protection: None  Lifestyle  . Physical activity:    Days  per week: Not on file    Minutes per session: Not on file  . Stress: Not on file  Relationships  . Social connections:    Talks on phone: Not on file    Gets together: Not on file    Attends religious service: Not on file    Active member of club or organization: Not on file    Attends meetings of clubs or organizations: Not on file    Relationship status: Not on file  Other Topics Concern  . Not on file  Social History Narrative  . Not on file   Outpatient Encounter Medications as of 02/12/2018  Medication Sig  . acetaminophen (TYLENOL) 325 MG tablet Place 650 mg into feeding tube every 4 (four) hours as needed.  . Amino Acids-Protein Hydrolys (FEEDING SUPPLEMENT, PRO-STAT SUGAR FREE 64,) LIQD Place 30 mLs into feeding tube 2 (two) times daily between meals. Per gastric tube   . apixaban (ELIQUIS) 5 MG TABS tablet Place 1 tablet (5 mg total) into feeding tube 2 (two) times daily.  Marland Kitchen apixaban (ELIQUIS) 5 MG TABS tablet Take 5 mg by mouth 2 (two) times daily.  . bisacodyl (DULCOLAX) 10  MG suppository Place 1 suppository (10 mg total) rectally daily as needed for moderate constipation.  . Cholecalciferol (VITAMIN D) 2000 units CAPS Take 1 capsule (2,000 Units total) by mouth daily.  . coal tar (NEUTROGENA T-GEL) 0.5 % shampoo Apply 1 application topically See admin instructions. Apply to scalp on shower days twice a week Mon and Thur  . docusate (COLACE) 50 MG/5ML liquid Place 10 mLs (100 mg total) into feeding tube 2 (two) times daily as needed for mild constipation.  . gabapentin (NEURONTIN) 100 MG capsule 200 mg at bedtime. Feeding tube  . insulin detemir (LEVEMIR) 100 UNIT/ML injection Inject 17 Units into the skin daily.   . metoprolol tartrate (LOPRESSOR) 25 MG tablet Place 0.5 tablets (12.5 mg total) into feeding tube 2 (two) times daily.  . mirtazapine (REMERON) 15 MG tablet Place 7.5 mg into feeding tube at bedtime.   . Nutritional Supplements (FEEDING SUPPLEMENT, GLUCERNA 1.2  CAL,) LIQD Place 300 mLs into feeding tube 4 (four) times daily.  . pantoprazole sodium (PROTONIX) 40 mg/20 mL PACK Place 20 mLs (40 mg total) into feeding tube daily.  . polyethylene glycol (MIRALAX / GLYCOLAX) packet Place 17 g into feeding tube daily as needed.  . Water For Irrigation, Sterile (FREE WATER) SOLN Place 300 mLs into feeding tube every 4 (four) hours. Keep HOB elevated at 45 degrees at all times during feedings, flushings, and medications 12a, 4a, 8a, 12p, 4p,8p   No facility-administered encounter medications on file as of 02/12/2018.    ALLERGIES: Allergies  Allergen Reactions  . Cheese   . Penicillins Hives    Has patient had a PCN reaction causing immediate rash, facial/tongue/throat swelling, SOB or lightheadedness with hypotension: Unknown Has patient had a PCN reaction causing severe rash involving mucus membranes or skin necrosis: Unknown Has patient had a PCN reaction that required hospitalization: No Has patient had a PCN reaction occurring within the last 10 years: No If all of the above answers are "NO", then may proceed with Cephalosporin use.     VACCINATION STATUS:  There is no immunization history on file for this patient.  HPI Joe Wilkinson is 57 y.o. male who presents today with a medical history as above. he is being seen in follow-up for immobilization hypercalcemia.   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 - He is a poor historian. He is accompanying nursing home worker. -He was first seen for immobilization hypercalcemia, highest being 12.6 mg/dL.  He is status post Reclast infusion one time with proper expected clinical response subsequently.   -His most recent labs show stable calcium of 10.1.   -No new complaints today.  Review of Systems  Constitutional: + Remains wheelchair-bound,  no subjective hyperthermia, no subjective hypothermia, + wheelchair bound. Eyes: no blurry  vision, no xerophthalmia ENT: no sore throat, no nodules palpated in throat, no dysphagia/odynophagia, no hoarseness Cardiovascular: no Chest Pain, no Shortness of Breath, no palpitations, no leg swelling Respiratory: no cough, no SOB Gastrointestinal: no Nausea/Vomiting/Diarhhea Musculoskeletal:  Contracted left upper and lower extremities. + wheelchair bound. Skin: no rashes Neurological: He is reported to be incontinent of bladder.   psychiatric: no depression, no anxiety  Objective:    BP 118/71   Pulse 82   Wt Readings from Last 3 Encounters:  01/19/18 150 lb (68 kg)  12/05/17 160 lb 3.2 oz (72.7 kg)  10/26/17 164 lb 6.4 oz (74.6 kg)    Physical Exam  Constitutional:  + wheelchair-bound with left hemiparesis.  Eyes: PERRLA, EOMI, no exophthalmos ENT: moist mucous membranes, no thyromegaly, no cervical lymphadenopathy, significant skull deficit on right frontoparietal region.  Musculoskeletal:   contracted left upper and lower extremities. Skin: moist, warm, no rashes Neurological:  motor power 0/5 on left side, 3-4 out of 5 on the right side.    Recent Results (from the past 2160 hour(s))  Lipid panel     Status: Abnormal   Collection Time: 12/06/17  7:05 AM  Result Value Ref Range   Cholesterol 149 0 - 200 mg/dL   Triglycerides 276 (H) <150 mg/dL   HDL 27 (L) >40 mg/dL   Total CHOL/HDL Ratio 5.5 RATIO   VLDL 55 (H) 0 - 40 mg/dL   LDL Cholesterol 67 0 - 99 mg/dL    Comment:        Total Cholesterol/HDL:CHD Risk Coronary Heart Disease Risk Table                     Men   Women  1/2 Average Risk   3.4   3.3  Average Risk       5.0   4.4  2 X Average Risk   9.6   7.1  3 X Average Risk  23.4   11.0        Use the calculated Patient Ratio above and the CHD Risk Table to determine the patient's CHD Risk.        ATP III CLASSIFICATION (LDL):  <100     mg/dL   Optimal  100-129  mg/dL   Near or Above                    Optimal  130-159  mg/dL   Borderline   160-189  mg/dL   High  >190     mg/dL   Very High Performed at [facility] [place], [place] [address]   Hemoglobin A1c     Status: Abnormal   Collection Time: 12/06/17  7:05 AM  Result Value Ref Range   Hgb A1c MFr Bld 4.5 (L) 4.8 - 5.6 %    Comment: (NOTE) Pre diabetes:          5.7%-6.4% Diabetes:              >6.4% Glycemic control for   <7.0% adults with diabetes    Mean Plasma Glucose 82.45 mg/dL    Comment: Performed at [facility] [address], [place], [place] [address]  CBC with Differential/Platelet     Status: None   Collection Time: 12/26/17  7:00 AM  Result Value Ref Range   WBC 10.0 4.0 - 10.5 K/uL   RBC 5.37 4.22 - 5.81 MIL/uL   Hemoglobin 14.5 13.0 - 17.0 g/dL   HCT 47.8 39.0 - 52.0 %   MCV 89.0 78.0 - 100.0 fL   MCH 27.0 26.0 - 34.0 pg   MCHC 30.3 30.0 - 36.0 g/dL   RDW 14.6 11.5 - 15.5 %   Platelets 174 150 - 400 K/uL    Comment: SPECIMEN CHECKED FOR CLOTS PLATELET COUNT CONFIRMED BY SMEAR GIANT PLATELETS SEEN    Neutrophils Relative % 49 %   Neutro Abs 5.0 1.7 - 7.7 K/uL   Lymphocytes Relative 38 %   Lymphs Abs 3.8 0.7 - 4.0 K/uL   Monocytes Relative 8 %   Monocytes Absolute 0.8 0.1 - 1.0 K/uL   Eosinophils Relative  4 %   Eosinophils Absolute 0.4 0.0 - 0.7 K/uL   Basophils Relative 1 %   Basophils Absolute 0.1 0.0 - 0.1 K/uL    Comment: Performed at [facility], [address], [place], [place] [address]  Basic metabolic panel     Status: None   Collection Time: 12/26/17  7:00 AM  Result Value Ref Range   Sodium 139 135 - 145 mmol/L   Potassium 4.2 3.5 - 5.1 mmol/L   Chloride 105 101 - 111 mmol/L   CO2 27 22 - 32 mmol/L   Glucose, Bld 91 65 - 99 mg/dL   BUN 17 6 - 20 mg/dL   Creatinine, Ser 0.70 0.61 - 1.24 mg/dL   Calcium 10.0 8.9 - 10.3 mg/dL   GFR calc non Af Amer >60 >60 mL/min   GFR calc Af Amer >60 >60 mL/min    Comment: (NOTE) The eGFR has been calculated using the CKD EPI equation. This calculation has not  been validated in all clinical situations. eGFR's persistently <60 mL/min signify possible Chronic Kidney Disease.    Anion gap 7 5 - 15    Comment: Performed at [facility], [address], [place], [place] [address]  Basic metabolic panel     Status: None   Collection Time: 01/19/18  5:35 PM  Result Value Ref Range   Sodium 143 135 - 145 mmol/L   Potassium 4.6 3.5 - 5.1 mmol/L   Chloride 108 101 - 111 mmol/L   CO2 29 22 - 32 mmol/L   Glucose, Bld 88 65 - 99 mg/dL   BUN 15 6 - 20 mg/dL   Creatinine, Ser 0.80 0.61 - 1.24 mg/dL   Calcium 10.1 8.9 - 10.3 mg/dL   GFR calc non Af Amer >60 >60 mL/min   GFR calc Af Amer >60 >60 mL/min    Comment: (NOTE) The eGFR has been calculated using the CKD EPI equation. This calculation has not been validated in all clinical situations. eGFR's persistently <60 mL/min signify possible Chronic Kidney Disease.    Anion gap 6 5 - 15    Comment: Performed at [facility], [address] [place], [place] [address]  CBC with Differential     Status: Abnormal   Collection Time: 01/19/18  5:35 PM  Result Value Ref Range   WBC 10.9 (H) 4.0 - 10.5 K/uL   RBC 5.34 4.22 - 5.81 MIL/uL   Hemoglobin 14.4 13.0 - 17.0 g/dL   HCT 47.9 39.0 - 52.0 %   MCV 89.7 78.0 - 100.0 fL   MCH 27.0 26.0 - 34.0 pg   MCHC 30.1 30.0 - 36.0 g/dL   RDW 14.6 11.5 - 15.5 %   Platelets 198 150 - 400 K/uL   Neutrophils Relative % 54 %   Neutro Abs 6.0 1.7 - 7.7 K/uL   Lymphocytes Relative 34 %   Lymphs Abs 3.7 0.7 - 4.0 K/uL   Monocytes Relative 7 %   Monocytes Absolute 0.8 0.1 - 1.0 K/uL   Eosinophils Relative 4 %   Eosinophils Absolute 0.5 0.0 - 0.7 K/uL   Basophils Relative 1 %   Basophils Absolute 0.1 0.0 - 0.1 K/uL    Comment: Performed at [facility], [address] [place], [place] [address]  TSH     Status: None   Collection Time: 02/06/18  3:30 AM  Result Value Ref Range   TSH 1.524 0.350 - 4.500 uIU/mL    Comment: Performed by a 3rd Generation assay  with a  functional sensitivity of <=0.01 uIU/mL. Performed at [facility], [address], [place], [place] [address]       Assessment & Plan:   1. 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  -He is status post Reclast infusion with subsequent improvement of calcium to stable normal range, most recently 10.1.  -He advised to discontinue calcium supplement, large dose of vitamin D supplement.   -He will continue to benefit from vitamin D3 2000 units daily.    - I advised patient to maintain close follow up with Virgie Dad, MD for primary care needs.  Follow up plan: Return in about 1 year (around 02/13/2019), or if symptoms worsen or fail to improve, for follow up with pre-visit labs.   Glade Lloyd, MD [facility] [address] [place], [place] [address] Phone: [phone]  Fax: [phone]     02/12/2018, 11:00 AM  This note was partially dictated with voice recognition software. Similar sounding words can be transcribed inadequately or may not  be corrected upon review.

## 2018-02-27 ENCOUNTER — Encounter: Payer: Self-pay | Admitting: Internal Medicine

## 2018-02-27 ENCOUNTER — Non-Acute Institutional Stay (SKILLED_NURSING_FACILITY): Payer: Medicaid Other | Admitting: Internal Medicine

## 2018-02-27 DIAGNOSIS — I69354 Hemiplegia and hemiparesis following cerebral infarction affecting left non-dominant side: Secondary | ICD-10-CM | POA: Diagnosis not present

## 2018-02-27 DIAGNOSIS — Z9889 Other specified postprocedural states: Secondary | ICD-10-CM

## 2018-02-27 DIAGNOSIS — I480 Paroxysmal atrial fibrillation: Secondary | ICD-10-CM | POA: Diagnosis not present

## 2018-02-27 DIAGNOSIS — E108 Type 1 diabetes mellitus with unspecified complications: Secondary | ICD-10-CM

## 2018-02-27 NOTE — Progress Notes (Signed)
Location:   [place] Room Number: 104/W Place of Service:  SNF [phone]) Provider:  Veleta Miners MD  Virgie Dad, MD  Patient Care Team: Virgie Dad, MD as PCP - General (Internal Medicine)  Extended Emergency Contact Information Primary Emergency Contact: Theresia Bough [place] Phone: [phone] Relation: Sister Secondary Emergency Contact: Nickie Retort [place] of [place] Mobile Phone: [phone] Relation: Aunt  Code Status:  Dull Code Goals of care: Advanced Directive information Advanced Directives 02/27/2018  Does Patient Have a Medical Advance Directive? Yes  Type of Advance Directive (No Data)  Does patient want to make changes to medical advance directive? No - Patient declined  Would patient like information on creating a medical advance directive? No - Patient declined     Chief Complaint  Patient presents with  . Medical Management of Chronic Issues    Patient is being seen for Routine Visit of Medical Management Patient is due DM Albumin, Eye Exam T-dap, and Prev 13    HPI:  Pt is a 57 y.o. male seen today for medical management of chronic diseases.    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 And sepsis due to Gluteal Abscess Requiring Surgical Debridement in 02/19  Patient was seen today for routine visit.  He continues to do well in the facility. He is eating better and maintaining his weight.  He gets some supplements from his PEG tube.  He did complain of some pain in his left elbow.  He said he has this pain for last 2 weeks.  No history of any falls. He also had complained of mild cough Patient continues to adjust to long-term care.  He had no new nursing issues..  Past  Medical History:  Diagnosis Date  . DM (diabetes mellitus) ([place])   . Hypertension   . Stroke [facility])    Past Surgical History:  Procedure Laterality Date  . CRANIECTOMY Right 04/01/2017   Procedure: RIGHT DECOMPRESSIVE CRANIECTOMY;  Surgeon: Ditty, Kevan Ny, MD;  Location: [place];  Service: Neurosurgery;  Laterality: Right;  . ESOPHAGOGASTRODUODENOSCOPY N/A 04/13/2017   Procedure: ESOPHAGOGASTRODUODENOSCOPY (EGD);  Surgeon: Georganna Skeans, MD;  Location: [place];  Service: General;  Laterality: N/A;  bedside  . INCISION AND DRAINAGE PERIRECTAL ABSCESS N/A 09/28/2017   Procedure: IRRIGATION AND DEBRIDEMENT PERIANAL ABSCESS;  Surgeon: Virl Cagey, MD;  Location: [facility];  Service: General;  Laterality: N/A;  . PEG PLACEMENT N/A 04/13/2017   Procedure: PERCUTANEOUS ENDOSCOPIC GASTROSTOMY (PEG) PLACEMENT;  Surgeon: Georganna Skeans, MD;  Location: [facility] ENDOSCOPY;  Service: General;  Laterality: N/A;    Allergies  Allergen Reactions  . Cheese   . Penicillins Hives    Has patient had a PCN reaction causing immediate rash, facial/tongue/throat swelling, SOB or lightheadedness with hypotension: Unknown Has patient had a PCN reaction causing severe rash involving mucus membranes or skin necrosis: Unknown Has patient had a PCN reaction that required hospitalization: No Has patient had a PCN reaction occurring within the last 10 years: No If all of the above answers are "NO", then may proceed with Cephalosporin use.     Outpatient Encounter Medications as of 02/27/2018  Medication Sig  . acetaminophen (TYLENOL) 325 MG tablet Place 650 mg into feeding tube every 4 (four) hours as needed.  . Amino Acids-Protein Hydrolys (FEEDING SUPPLEMENT, PRO-STAT  SUGAR FREE 64,) LIQD Place 30 mLs into feeding tube 2 (two) times daily between meals. Per gastric tube   . apixaban (ELIQUIS) 5 MG TABS tablet Place 1 tablet (5 mg total) into feeding tube 2 (two) times daily.  . bisacodyl (DULCOLAX) 10 MG  suppository Place 1 suppository (10 mg total) rectally daily as needed for moderate constipation.  . Cholecalciferol (VITAMIN D) 2000 units CAPS Take 1 capsule (2,000 Units total) by mouth daily.  . coal tar (NEUTROGENA T-GEL) 0.5 % shampoo Apply 1 application topically See admin instructions. Apply to scalp on shower days twice a week Mon and Thur  . docusate (COLACE) 50 MG/5ML liquid Place 10 mLs (100 mg total) into feeding tube 2 (two) times daily as needed for mild constipation.  . gabapentin (NEURONTIN) 100 MG capsule 200 mg at bedtime. Feeding tube  . insulin detemir (LEVEMIR) 100 UNIT/ML injection Inject 17 Units into the skin daily.   . metoprolol tartrate (LOPRESSOR) 25 MG tablet Place 0.5 tablets (12.5 mg total) into feeding tube 2 (two) times daily.  . mirtazapine (REMERON) 15 MG tablet Place 7.5 mg into feeding tube at bedtime.   . Nutritional Supplements (FEEDING SUPPLEMENT, GLUCERNA 1.2 CAL,) LIQD Place 300 mLs into feeding tube 4 (four) times daily.  . pantoprazole sodium (PROTONIX) 40 mg/20 mL PACK Place 20 mLs (40 mg total) into feeding tube daily.  . polyethylene glycol (MIRALAX / GLYCOLAX) packet Place 17 g into feeding tube daily as needed.  . Water For Irrigation, Sterile (FREE WATER) SOLN Place 300 mLs into feeding tube every 4 (four) hours. Keep HOB elevated at 45 degrees at all times during feedings, flushings, and medications 12a, 4a, 8a, 12p, 4p,8p  . [DISCONTINUED] apixaban (ELIQUIS) 5 MG TABS tablet Take 5 mg by mouth 2 (two) times daily.   No facility-administered encounter medications on file as of 02/27/2018.      Review of Systems  Review of Systems  Constitutional: Negative for activity change, appetite change, chills, diaphoresis, fatigue and fever.  HENT: Negative for mouth sores, postnasal drip, rhinorrhea, sinus pain and sore throat.   Respiratory: Negative for apnea, , chest tightness, shortness of breath and wheezing.   Cardiovascular: Negative for chest  pain, palpitations and leg swelling.  Gastrointestinal: Negative for abdominal distention, abdominal pain, constipation, diarrhea, nausea and vomiting.  Genitourinary: Negative for dysuria and frequency.  Musculoskeletal: Negative for arthralgias, joint swelling and myalgias.  Skin: Negative for rash.  Neurological: Negative for dizziness, syncope, weakness, light-headedness and numbness.  Psychiatric/Behavioral: Negative for behavioral problems, confusion and sleep disturbance.      There is no immunization history on file for this patient. Pertinent  Health Maintenance Due  Topic Date Due  . OPHTHALMOLOGY EXAM  03/30/2018 (Originally 04/06/1971)  . URINE MICROALBUMIN  03/30/2018 (Originally 04/06/1971)  . INFLUENZA VACCINE  03/08/2018  . HEMOGLOBIN A1C  06/08/2018  . FOOT EXAM  08/24/2018  . COLONOSCOPY  Discontinued   Fall Risk  10/10/2017 09/13/2017 09/04/2017  Falls in the past year? No No No  Risk for fall due to : - Impaired balance/gait;Impaired mobility;Medication side effect;Mental status change -   Functional Status Survey:    Vitals:   02/27/18 1049  BP: 117/67  Pulse: 81  Resp: 18  Temp: (!) 97 F (36.1 C)  TempSrc: Oral  SpO2: 94%  Weight: 158 lb 3.2 oz (71.8 kg)  Height: 5\' 6"  (1.676 m)   Body mass index is 25.53 kg/m. Physical Exam  Constitutional: He appears well-developed  and well-nourished.  HENT:  Mouth/Throat: Oropharynx is clear and moist.  Has protruding Area on head  Eyes: Pupils are equal, round, and reactive to light.  Neck: Neck supple.  Cardiovascular: Regular rhythm.  Pulmonary/Chest: Effort normal and breath sounds normal. No stridor. No respiratory distress. He has no wheezes.  Abdominal: Soft. Bowel sounds are normal. He exhibits no distension. There is no tenderness. There is no guarding.  Peg Site was Normal   Musculoskeletal: He exhibits no edema.  Left Elbow was tender and Patient c/o Pain on Movement  Neurological: He is alert.    Has Left Hemiparesis    Labs reviewed: Recent Labs    04/12/17 0151  09/05/17 0709 09/24/17 1527  09/25/17 0545  10/17/17 0710 12/26/17 0700 01/19/18 1735  NA 139   < >  --  140   < >  --    < > 139 139 143  K 3.8   < >  --  4.2   < >  --    < > 4.1 4.2 4.6  CL 106   < >  --  103   < >  --    < > 106 105 108  CO2 26   < >  --  26   < >  --    < > 24 27 29   GLUCOSE 147*   < >  --  141*   < >  --    < > 104* 91 88  BUN 15   < >  --  24*   < >  --    < > 18 17 15   CREATININE 0.75   < >  --  0.90   < >  --    < > 0.57* 0.70 0.80  CALCIUM 8.5*   < > 11.5* 9.2   < >  --    < > 9.5 10.0 10.1  MG 2.2  --  2.1  --   --  2.1  --   --   --   --   PHOS 4.4  --  3.7 2.2*  --   --   --   --   --   --    < > = values in this interval not displayed.   Recent Labs    09/24/17 1527 10/10/17 0714 10/17/17 0710  AST 34 22 16  ALT 59 46 35  ALKPHOS 102 53 49  BILITOT 1.0 0.3 0.4  PROT 8.3* 7.6 6.8  ALBUMIN 2.6* 3.2* 3.1*   Recent Labs    10/10/17 0714 12/26/17 0700 01/19/18 1735  WBC 8.5 10.0 10.9*  NEUTROABS 3.8 5.0 6.0  HGB 11.6* 14.5 14.4  HCT 40.0 47.8 47.9  MCV 93.5 89.0 89.7  PLT 443* 174 198   Lab Results  Component Value Date   TSH 1.524 02/06/2018   Lab Results  Component Value Date   HGBA1C 4.5 (L) 12/06/2017   Lab Results  Component Value Date   CHOL 149 12/06/2017   HDL 27 (L) 12/06/2017   LDLCALC 67 12/06/2017   TRIG 276 (H) 12/06/2017   CHOLHDL 5.5 12/06/2017    Significant Diagnostic Results in last 30 days:  No results found.  Assessment/Plan Type 1 diabetes mellitus with complications Last O6V was 4.5 in 05/19 BS are running mostly less then 150 Now only on Levimir AS we are going to reduce his tube feed will decrease his Levimir to 10 units Continue to follow Accu  checks.BID  AF (paroxysmal atrial fibrillation) In Sinus rhythm On Eliquis  Cerebrovascular accident (CVA) due to embolism of right middle cerebral arteryS/P  Craniectomy On Eliquis Continues to be full Nursing care LDL less then 67 in 05/19  BP Controlled Follows with Neurology Also Has Follow up with Neurosurgery for Craniectomy.  Essential hypertension On Metoprolol  S/PPEG Placement Patient continues to need PEG tube . But his PO intake has improved . Will continue Remeron D/W Dietary and we will taper his Peg feeding to now 200 cc and encourage PO feeds. Patient Maintaining his weight Status post craniectomy Arrange Follow up With Neurosurgery Hypercalcemia On Iv Reclast Thought to be due to Immobility Follows with Dr Dorris Fetch.closely. Calcium stopped . On Lower dose of Vit D Calcium staying stable Gluteal wound Completely resolved Left Elbow Pain Xray was ordered which is negative for any acute Process Will D/W therapy for Brace to support    Family/ staff Communication:   Labs/tests ordered:

## 2018-03-27 ENCOUNTER — Encounter: Payer: Self-pay | Admitting: Internal Medicine

## 2018-03-27 ENCOUNTER — Non-Acute Institutional Stay (SKILLED_NURSING_FACILITY): Payer: Medicaid Other | Admitting: Internal Medicine

## 2018-03-27 DIAGNOSIS — E108 Type 1 diabetes mellitus with unspecified complications: Secondary | ICD-10-CM | POA: Diagnosis not present

## 2018-03-27 DIAGNOSIS — E559 Vitamin D deficiency, unspecified: Secondary | ICD-10-CM

## 2018-03-27 DIAGNOSIS — Z931 Gastrostomy status: Secondary | ICD-10-CM

## 2018-03-27 DIAGNOSIS — I10 Essential (primary) hypertension: Secondary | ICD-10-CM | POA: Diagnosis not present

## 2018-03-27 DIAGNOSIS — I69354 Hemiplegia and hemiparesis following cerebral infarction affecting left non-dominant side: Secondary | ICD-10-CM

## 2018-03-27 DIAGNOSIS — I480 Paroxysmal atrial fibrillation: Secondary | ICD-10-CM

## 2018-03-27 NOTE — Progress Notes (Signed)
Location:   [facility] Room Number: W546E Place of Service:  SNF [phone]) Provider:  Freddi Starr, MD  Patient Care Team: Virgie Dad, MD as PCP - General (Internal Medicine)  Extended Emergency Contact Information Primary Emergency Contact: Theresia Bough [place] Mobile Phone: [phone] Relation: Sister Secondary Emergency Contact: Nickie Retort [place] of [place] Mobile Phone: [phone] Relation: Aunt  Code Status:  Full Code Goals of care: Advanced Directive information Advanced Directives 02/27/2018  Does Patient Have a Medical Advance Directive? Yes  Type of Advance Directive (No Data)  Does patient want to make changes to medical advance directive? No - Patient declined  Would patient like information on creating a medical advance directive? No - Patient declined     Chief Complaint  Patient presents with  . 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 male seen today for medical management of chronic issues as noted above.  He appears to be doing well-nursing does not report any issues and he is not complaining of any issues at this time either.  His weight appears to be stable at 158.4 pounds.  He does have a PEG tube but also has by mouth p.o. intake.  He has tolerated his PEG feedings well. PEG feedings have been titrated down secondary to better p.o. intake.  He does have a history of hypercalcemia and is followed by endocrinology last calcium level was 10.1 on lab done in June  will update this.  In regards to CVA again he has left-sided hemiparalysis continues on Eliquis his LDL was 67 on lab done in May.  Blood pressure appears to be quite stable he is on Lopressor- blood pressure this morning was 128/76-yesterday was roughly at the same level throughout all day parts again this appears to be quite consistent- lowest reading I see is 96/64 but this is rare highest ones that I see listed is 156/88  Currently he is lying in bed comfortably has no acute complaints at times will complain of headache but medications appear to help with this he mainly takes Tylenol for this.  He is also on Neurontin at night for neuropathy.      Past Medical History:  Diagnosis Date  . DM (diabetes mellitus) ([place])   . Hypertension   . Stroke [facility])    Past Surgical History:  Procedure Laterality Date  . CRANIECTOMY Right 04/01/2017   Procedure: RIGHT DECOMPRESSIVE CRANIECTOMY;  Surgeon: Ditty, Kevan Ny, MD;  Location: [place];  Service: Neurosurgery;  Laterality: Right;  . ESOPHAGOGASTRODUODENOSCOPY N/A 04/13/2017   Procedure: ESOPHAGOGASTRODUODENOSCOPY (EGD);  Surgeon: Georganna Skeans, MD;  Location: [place];  Service: General;  Laterality: N/A;  bedside  . INCISION AND DRAINAGE PERIRECTAL ABSCESS N/A 09/28/2017   Procedure: IRRIGATION AND DEBRIDEMENT PERIANAL ABSCESS;  Surgeon: Virl Cagey, MD;  Location: [facility];  Service: General;  Laterality: N/A;  . PEG PLACEMENT N/A 04/13/2017   Procedure: PERCUTANEOUS ENDOSCOPIC GASTROSTOMY (PEG) PLACEMENT;  Surgeon: Georganna Skeans, MD;  Location: [facility] ENDOSCOPY;  Service: General;  Laterality: N/A;    Allergies  Allergen Reactions  . Cheese   . Penicillins  Hives    Has patient had a PCN reaction causing immediate rash, facial/tongue/throat swelling, SOB or lightheadedness with hypotension: Unknown Has patient had a PCN reaction causing severe rash involving mucus membranes or skin necrosis: Unknown Has patient had a PCN  reaction that required hospitalization: No Has patient had a PCN reaction occurring within the last 10 years: No If all of the above answers are "NO", then may proceed with Cephalosporin use.     Outpatient Encounter Medications as of 03/27/2018  Medication Sig  . acetaminophen (TYLENOL) 325 MG tablet Place 650 mg into feeding tube every 4 (four) hours as needed.  . Amino Acids-Protein Hydrolys (FEEDING SUPPLEMENT, PRO-STAT SUGAR FREE 64,) LIQD Place 30 mLs into feeding tube 2 (two) times daily between meals. Per gastric tube   . apixaban (ELIQUIS) 5 MG TABS tablet Place 1 tablet (5 mg total) into feeding tube 2 (two) times daily.  . bisacodyl (DULCOLAX) 10 MG suppository Place 1 suppository (10 mg total) rectally daily as needed for moderate constipation.  . Cholecalciferol (VITAMIN D) 2000 units CAPS Take 1 capsule (2,000 Units total) by mouth daily.  . coal tar (NEUTROGENA T-GEL) 0.5 % shampoo Apply 1 application topically See admin instructions. Apply to scalp on shower days twice a week Mon and Thur  . docusate (COLACE) 50 MG/5ML liquid Place 10 mLs (100 mg total) into feeding tube 2 (two) times daily as needed for mild constipation.  . gabapentin (NEURONTIN) 100 MG capsule 200 mg at bedtime. Feeding tube  . insulin detemir (LEVEMIR) 100 UNIT/ML injection Inject 10 Units into the skin daily.   . metoprolol tartrate (LOPRESSOR) 25 MG tablet Place 0.5 tablets (12.5 mg total) into feeding tube 2 (two) times daily.  . mirtazapine (REMERON) 15 MG tablet Place 7.5 mg into feeding tube at bedtime.   . Nutritional Supplements (FEEDING SUPPLEMENT, GLUCERNA 1.2 CAL,) LIQD Place 300 mLs into feeding tube 4 (four) times daily. (Patient taking differently: Place 300 mLs into feeding tube daily. )  . pantoprazole sodium (PROTONIX) 40 mg/20 mL PACK Place 20 mLs (40 mg total) into feeding tube daily.  . polyethylene glycol (MIRALAX / GLYCOLAX) packet Place 17 g into feeding tube daily as needed.  . Water  For Irrigation, Sterile (FREE WATER) SOLN Place 300 mLs into feeding tube every 4 (four) hours. Keep HOB elevated at 45 degrees at all times during feedings, flushings, and medications 12a, 4a, 8a, 12p, 4p,8p   No facility-administered encounter medications on file as of 03/27/2018.     Review of Systems General is not complaining of any fever chills weight appears to be stable.  Skin does not complain of rashes or itching.  Head ears eyes nose mouth and throat is not complaining of any sore throat or visual changes at times will complain of a headache Tylenol appears to help with this.  Respiratory does not complain of shortness of breath or cough.  Cardiac does not complain of chest pain does not appear to have significant lower extremity edema.  GI does not complain of abdominal pain nausea vomiting diarrhea constipation does have a PEG tube tolerates feedings well again p.o. intake has improved.  GU is not complaining of any dysuria.  Musculoskeletal does have left-sided hemiparesis but is not complaining of joint pain.  Neurologic does not complain of dizziness or syncope again at times will complain of headaches and also has a history of neuropathy continues on Neurontin.  Psych does not complain of being overtly anxious or depressed  appears to have adapted well at least for the time being to skilled nursing  There is no immunization history on file for this patient. Pertinent  Health Maintenance Due  Topic Date Due  . INFLUENZA VACCINE  03/08/2018  . OPHTHALMOLOGY EXAM  03/30/2018 (Originally 04/06/1971)  . URINE MICROALBUMIN  03/30/2018 (Originally 04/06/1971)  . HEMOGLOBIN A1C  06/08/2018  . FOOT EXAM  08/24/2018  . COLONOSCOPY  Discontinued   Fall Risk  10/10/2017 09/13/2017 09/04/2017  Falls in the past year? No No No  Risk for fall due to : - Impaired balance/gait;Impaired mobility;Medication side effect;Mental status change -   Functional Status Survey:    Vitals:    03/27/18 1439  BP: 126/80  Pulse: 79  Resp: 19  Temp: 98.4 F (36.9 C)  TempSrc: Oral  SpO2: 93%  Weight: 158 lb 6.4 oz (71.8 kg)  Height: 5\' 6"  (1.676 m)   Body mass index is 25.57 kg/m. Physical Exam  Wt:158.4, BP: 126/80, P:79, RR: 19, T: 98.4, SpO2:93%  In general this is a pleasant middle-age male in no distress lying comfortably in bed.  His skin is warm and dry.  Eyes pupils appear reactive light sclera and conjunctive are clear visual acuity appears grossly intact  Oropharynx is clear mucous membranes moist.  Head he does have a protrusion on the right side of his head which appears relatively baseline   Chest is clear to auscultation there is no labored breathing.  Heart is regular rate and rhythm without murmur gallop or rub he does not have significant lower extremity edema.  Abdomen is soft nontender with positive bowel sounds PEG site appears unremarkable without increased erythema drainage.  Musculoskeletal continues with left-sided hemiparalysis at baseline does have movement of his right upper and lower extremities limited exam since he is in bed.  Neurologic again has left-sided hemiparalysis- he is alert  Psych he is alert and oriented pleasant and appropriate  Labs reviewed: Recent Labs    04/12/17 0151  09/05/17 0709 09/24/17 1527  09/25/17 0545  10/17/17 0710 12/26/17 0700 01/19/18 1735  NA 139   < >  --  140   < >  --    < > 139 139 143  K 3.8   < >  --  4.2   < >  --    < > 4.1 4.2 4.6  CL 106   < >  --  103   < >  --    < > 106 105 108  CO2 26   < >  --  26   < >  --    < > 24 27 29   GLUCOSE 147*   < >  --  141*   < >  --    < > 104* 91 88  BUN 15   < >  --  24*   < >  --    < > 18 17 15   CREATININE 0.75   < >  --  0.90   < >  --    < > 0.57* 0.70 0.80  CALCIUM 8.5*   < > 11.5* 9.2   < >  --    < > 9.5 10.0 10.1  MG 2.2  --  2.1  --   --  2.1  --   --   --   --   PHOS 4.4  --  3.7 2.2*  --   --   --   --   --   --    < > =  values in  this interval not displayed.   Recent Labs    09/24/17 1527 10/10/17 0714 10/17/17 0710  AST 34 22 16  ALT 59 46 35  ALKPHOS 102 53 49  BILITOT 1.0 0.3 0.4  PROT 8.3* 7.6 6.8  ALBUMIN 2.6* 3.2* 3.1*   Recent Labs    10/10/17 0714 12/26/17 0700 01/19/18 1735  WBC 8.5 10.0 10.9*  NEUTROABS 3.8 5.0 6.0  HGB 11.6* 14.5 14.4  HCT 40.0 47.8 47.9  MCV 93.5 89.0 89.7  PLT 443* 174 198   Lab Results  Component Value Date   TSH 1.524 02/06/2018   Lab Results  Component Value Date   HGBA1C 4.5 (L) 12/06/2017   Lab Results  Component Value Date   CHOL 149 12/06/2017   HDL 27 (L) 12/06/2017   LDLCALC 67 12/06/2017   TRIG 276 (H) 12/06/2017   CHOLHDL 5.5 12/06/2017    Significant Diagnostic Results in last 30 days:  No results found.  Assessment/Plan   #1 history of CVA with embolism of right middle cerebral artery status post craniotomy- continues to need extensive nursing care but appears to be stable in this regards his LDL is less than 70 on lipid panel done in May.  Blood pressure appears to be quite stable on Lopressor- he is followed by neurology  He does continue on Eliquis.  2.  History of atrial fibrillation this appears rate controlled on Lopressor he is on Eliquis for anticoagulation.  3.-History of type 1 diabetes- A1c in May was 4.5-blood sugars are quite stable sugars range from 89-107.  At bedtime sugars run fairly comparable 89 up to 120.  He continues on Levemir- 10 units   #4- history of hypertension- as noted above this appears to be quite stable on metoprolol 12.5 mg twice daily.  5 history of neuropathy continues on Neurontin at at bedtime and appears to be tolerating this well.  6.  History of hypercalcemia he is followed by endocrinology Dr. Dorris Fetch and has been on IV Reclast---this is thought secondary to immobility- calcium is been discontinued llast calcium level was 10.1 in June will update this.  7.  History of PEG tube placement he is  tolerating this well- his p.o. intake has improved-continues on Remeron.  Weight is stable  #8 history of gluteal wound again this required surgical debridement but it has resolved.  9 history of vitamin D deficiency he is on supplementation- will update a vitamin D level as well  Again will update a metabolic panel to keep an eye on his calcium also will update his CBC with diff--- last white count was 10.9 in June but his differential was within normal range--also will update vitamin D level  [id number]

## 2018-03-28 ENCOUNTER — Encounter (HOSPITAL_COMMUNITY)
Admission: RE | Admit: 2018-03-28 | Discharge: 2018-03-28 | Disposition: A | Discharge status: Home / Self Care | Payer: Medicaid Other | Source: Skilled Nursing Facility | Attending: Internal Medicine | Admitting: Internal Medicine

## 2018-03-28 DIAGNOSIS — I10 Essential (primary) hypertension: Secondary | ICD-10-CM | POA: Insufficient documentation

## 2018-03-28 DIAGNOSIS — E069 Thyroiditis, unspecified: Secondary | ICD-10-CM | POA: Insufficient documentation

## 2018-03-28 DIAGNOSIS — I63311 Cerebral infarction due to thrombosis of right middle cerebral artery: Secondary | ICD-10-CM | POA: Diagnosis present

## 2018-03-28 DIAGNOSIS — Z5189 Encounter for other specified aftercare: Secondary | ICD-10-CM | POA: Insufficient documentation

## 2018-03-28 LAB — BASIC METABOLIC PANEL
ANION GAP: 5 (ref 5–15)
BUN: 16 mg/dL (ref 6–20)
CALCIUM: 9.3 mg/dL (ref 8.9–10.3)
CO2: 27 mmol/L (ref 22–32)
Chloride: 108 mmol/L (ref 98–111)
Creatinine, Ser: 0.72 mg/dL (ref 0.61–1.24)
GFR calc non Af Amer: >60 mL/min (ref >60)
Glucose, Bld: 86 mg/dL (ref 70–99)
Potassium: 4.1 mmol/L (ref 3.5–5.1)
SODIUM: 140 mmol/L (ref 135–145)

## 2018-03-28 LAB — CBC WITH DIFFERENTIAL/PLATELET
BASOS ABS: 0.1 10*3/uL (ref 0.0–0.1)
BASOS PCT: 1 %
EOS PCT: 5 %
Eosinophils Absolute: 0.4 10*3/uL (ref 0.0–0.7)
HCT: 47.1 % (ref 39.0–52.0)
Hemoglobin: 14.4 g/dL (ref 13.0–17.0)
Lymphocytes Relative: 35 %
Lymphs Abs: 3.1 10*3/uL (ref 0.7–4.0)
MCH: 27.9 pg (ref 26.0–34.0)
MCHC: 30.6 g/dL (ref 30.0–36.0)
MCV: 91.3 fL (ref 78.0–100.0)
MONO ABS: 0.6 10*3/uL (ref 0.1–1.0)
Monocytes Relative: 7 %
Neutro Abs: 4.5 10*3/uL (ref 1.7–7.7)
Neutrophils Relative %: 52 %
PLATELETS: 167 10*3/uL (ref 150–400)
RBC: 5.16 MIL/uL (ref 4.22–5.81)
RDW: 14.3 % (ref 11.5–15.5)
WBC: 8.7 10*3/uL (ref 4.0–10.5)

## 2018-03-29 LAB — VITAMIN D 25 HYDROXY (VIT D DEFICIENCY, FRACTURES): Vit D, 25-Hydroxy: 30.2 ng/mL (ref 30.0–100.0)

## 2018-04-11 NOTE — Progress Notes (Signed)
[facility]  PATIENT: Joe Wilkinson DOB: [birth year]   REASON FOR VISIT: Follow-up for history of stroke August 2018 HISTORY FROM: Patient and CNA from [facility]    HISTORY OF PRESENT ILLNESS: 07/11/17 PSMr Verdi is a 60 year african [place] male seen today for first office follow-up visit following hospital admission for stroke in August 2018.  He is accompanied by [facility] center nursing home attendant where he stays. No family is available. I have personally reviewed the electronic medical records and imaging films. Joe Rosenboom Priceis an 56 y.o.malewho presented to [facility] in DKA. He was found lethargic at a bus stop and then became unresponsive. EMS reported O2 sat of 80% and BP of 90/40. His CBG revealed hyperglycemia. On arrival to [facility] the patient patient could tell the examiner his name but was unable to provide any other information. He was restless, moaning and moving all extremities, not following commands. Labs were consistent with DKA and he was started on insulin drip. Initial CT head at [facility] showed no acute abnormality. He developed Kussmaul breathing requiring intubation. 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 He was transported to [facility] for further care. He   developed hypotension requiring pressors.  He was taken for an emergent right hemi-craniectomy for malignant cerebral edema by Dr. Cyndy Freeze and had a prolonged neuro ICU stay which was complicated by acute respiratory failure with hypoxia, hypercarbia, septic shock secondary to Streptococcus agalactiae  pneumonia.  He also developed new onset atrial fibrillation with rapid ventricular rate.  E. coli and Klebsiella urinary tract infection.  Perianal cellulitis with small abscess.  He had a prolonged ICU stay and was eventually weaned  off ventilatory support.  Follow-up CT scan showed significant right hemispheric edema with herniation out outside craniotomy bone defect.  He underwent PEG tube placement by trauma team.  He was discharged on 05/29/17 to pain center for rehabilitation.  He is mental status has improved and he can speak and communicate but has persistent spastic dense hemiplegia.  He develops intermittent confusion.  He is still has persistent right-sided scalp swelling and has been seen in neurosurgery follow-up clinic on 06/21/17 and the decision to do cranioplasty has been postponed for a few more months until the swelling subsides.  He can eat a dysphagia 3 diet but is still getting mostly PEG tube feeds.  Patient has had a few falls.  He remains full support and requires 2 person support to stand and take a few steps.  He is getting ongoing physical occupational and speech therapy. UPDATE 3/5/2019[name], 57 year old male returns for follow-up with history of stroke in August 2018.  He remains on Eliquis for secondary stroke prevention and atrial fibrillation.  He was readmitted to the hospital on 09/24/2017 for sepsis and perianal abscess.  His PEG tube was dislodged.  He continues to have hemiparesis affecting the left side from his stroke event.  He continues to have a persistent right-sided scalp swelling and cranioplasty has been postponed until the swelling subsides.  He remains on a puree  diet and gets PEG feedings at night. Patient is wheelchair-bound.  His physical therapy was stopped because patient was not making progress.  No recent falls.  He is dependent for all activities of daily living.   He returns for reevaluation UPDATE 9/5/2019[name], 57 year old  male returns for follow-up with a history of stroke event in August 2018.  He is currently residing in a skilled facility.  He remains on Eliquis for secondary stroke prevention and atrial fibrillation.  He has not had further stroke or TIA symptoms.   He has a PEG tube with feedings at  night.  He is on a regular diet.  He continues to have significant left hemiparesis and a persistent right sided scalp swelling.  His cranioplasty has been postponed.  He sees a physician at [facility] he is due for follow-up in 6 months the patient says.  He is wheelchair-bound transfers with a Hoyer lift.  No recent falls.  He remains dependent for all activities of daily living except feeding himself.  Blood pressure in the office today 151/94.  Most recent hemoglobin A1c done 12/06/17  was 4.5.  Reviewed recent CBC BMP from 03/28/2018 within normal limits.  Vitamin D level 30.2 he returns for reevaluation. REVIEW OF SYSTEMS: Full 14 system review of systems performed and notable only for those listed, all others are neg:  Constitutional: neg  Cardiovascular: neg Ear/Nose/Throat: neg  Skin: neg Eyes: neg Respiratory: neg Gastroitestinal: neg  Hematology/Lymphatic: neg  Endocrine: neg Musculoskeletal: wheelchair-bound Allergy/Immunology: neg Neurological: neg Psychiatric: neg Sleep : neg   ALLERGIES: Allergies  Allergen Reactions  . Cheese   . Penicillins Hives    Has patient had a PCN reaction causing immediate rash, facial/tongue/throat swelling, SOB or lightheadedness with hypotension: Unknown Has patient had a PCN reaction causing severe rash involving mucus membranes or skin necrosis: Unknown Has patient had a PCN reaction that required hospitalization: No Has patient had a PCN reaction occurring within the last 10 years: No If all of the above answers are "NO", then may proceed with Cephalosporin use.     HOME MEDICATIONS: Outpatient Medications Prior to Visit  Medication Sig Dispense Refill  . acetaminophen (TYLENOL) 325 MG tablet Place 650 mg into feeding tube every 4 (four) hours as needed.    . Amino Acids-Protein Hydrolys (FEEDING SUPPLEMENT, PRO-STAT SUGAR FREE 64,) LIQD Place 30 mLs into feeding tube 2 (two) times daily between  meals. Per gastric tube     . apixaban (ELIQUIS) 5 MG TABS tablet Place 1 tablet (5 mg total) into feeding tube 2 (two) times daily. 60 tablet   . bisacodyl (DULCOLAX) 10 MG suppository Place 1 suppository (10 mg total) rectally daily as needed for moderate constipation. 12 suppository 0  . Cholecalciferol (VITAMIN D) 2000 units CAPS Take 1 capsule (2,000 Units total) by mouth daily. 30 capsule 6  . coal tar (NEUTROGENA T-GEL) 0.5 % shampoo Apply 1 application topically See admin instructions. Apply to scalp on shower days twice a week Mon and Thur    . docusate (COLACE) 50 MG/5ML liquid Place 10 mLs (100 mg total) into feeding tube 2 (two) times daily as needed for mild constipation. 100 mL 0  . gabapentin (NEURONTIN) 100 MG capsule 200 mg at bedtime. Feeding tube    . insulin detemir (LEVEMIR) 100 UNIT/ML injection Inject 10 Units into the skin daily.     . metoprolol tartrate (LOPRESSOR) 25 MG tablet Place 0.5 tablets (12.5 mg total) into feeding tube 2 (two) times daily.    . mirtazapine (REMERON) 15 MG tablet Place 7.5 mg into feeding tube at bedtime.     . pantoprazole sodium (PROTONIX) 40 mg/20 mL PACK Place 20 mLs (40 mg total) into feeding tube daily. 30 each   . polyethylene  glycol (MIRALAX / GLYCOLAX) packet Place 17 g into feeding tube daily as needed. 14 each 0  . Water For Irrigation, Sterile (FREE WATER) SOLN Place 300 mLs into feeding tube every 4 (four) hours. Keep HOB elevated at 45 degrees at all times during feedings, flushings, and medications 12a, 4a, 8a, 12p, 4p,8p    . Nutritional Supplements (FEEDING SUPPLEMENT, GLUCERNA 1.2 CAL,) LIQD Place 300 mLs into feeding tube 4 (four) times daily. (Patient taking differently: Place 300 mLs into feeding tube daily. )     No facility-administered medications prior to visit.     PAST MEDICAL HISTORY: Past Medical History:  Diagnosis Date  . DM (diabetes mellitus) ([place])   . Hypertension   . Stroke [facility])     PAST SURGICAL  HISTORY: Past Surgical History:  Procedure Laterality Date  . CRANIECTOMY Right 04/01/2017   Procedure: RIGHT DECOMPRESSIVE CRANIECTOMY;  Surgeon: Ditty, Kevan Ny, MD;  Location: [place];  Service: Neurosurgery;  Laterality: Right;  . ESOPHAGOGASTRODUODENOSCOPY N/A 04/13/2017   Procedure: ESOPHAGOGASTRODUODENOSCOPY (EGD);  Surgeon: Georganna Skeans, MD;  Location: [place];  Service: General;  Laterality: N/A;  bedside  . INCISION AND DRAINAGE PERIRECTAL ABSCESS N/A 09/28/2017   Procedure: IRRIGATION AND DEBRIDEMENT PERIANAL ABSCESS;  Surgeon: Virl Cagey, MD;  Location: [facility];  Service: General;  Laterality: N/A;  . PEG PLACEMENT N/A 04/13/2017   Procedure: PERCUTANEOUS ENDOSCOPIC GASTROSTOMY (PEG) PLACEMENT;  Surgeon: Georganna Skeans, MD;  Location: [place];  Service: General;  Laterality: N/A;    FAMILY HISTORY: History reviewed. No pertinent family history.  SOCIAL HISTORY: Social History   Socioeconomic History  . Marital status: Unknown    Spouse name: Not on file  . Number of children: Not on file  . Years of education: Not on file  . Highest education level: Not on file  Occupational History  . Not on file  Social Needs  . Financial resource strain: Not on file  . Food insecurity:    Worry: Not on file    Inability: Not on file  . Transportation needs:    Medical: Not on file    Non-medical: Not on file  Tobacco Use  . Smoking status: Former Smoker    Types: Cigarettes  . Smokeless tobacco: Never Used  . Tobacco comment: UTA  Substance and Sexual Activity  . Alcohol use: No    Comment: UTA  . Drug use: No    Comment: UTA  . Sexual activity: Not Currently    Birth control/protection: None  Lifestyle  . Physical activity:    Days per week: Not on file    Minutes per session: Not on file  . Stress: Not on file  Relationships  . Social connections:    Talks on phone: Not on file    Gets together: Not on file    Attends religious service: Not  on file    Active member of club or organization: Not on file    Attends meetings of clubs or organizations: Not on file    Relationship status: Not on file  . Intimate partner violence:    Fear of current or ex partner: Not on file    Emotionally abused: Not on file    Physically abused: Not on file    Forced sexual activity: Not on file  Other Topics Concern  . Not on file  Social History Narrative  . Not on file     PHYSICAL EXAM  Vitals:   04/12/18 0817  BP: Marland Kitchen)  151/94  Pulse: 91  SpO2: 97%  Height: 5\' 6"  (1.676 m)   Body mass index is 25.57 kg/m.  Generalized: Well developed, in no acute distress  Head: normocephalic and atraumatic,. Oropharynx benign  Neck: Supple, no carotid bruits  Cardiac: Regular rate rhythm, no murmur  Musculoskeletal: No deformity   Neurological examination   Mentation: Alert oriented to time, place, history taking. Attention span and concentration appropriate. Recent and remote memory intact.  Follows all commands, mild dysarthria.   Cranial nerve II-XII: Fundoscopic exam not done.Pupils were equal round reactive to light extraocular movements were full, visual field with dense left homonymous hemianopia,  on confrontational test.  Mild left lower facial weakness  hearing was intact to finger rubbing bilaterally. Uvula tongue midline. head turning and shoulder shrug were normal and symmetric.Tongue protrusion into cheek strength was normal. Motor: normal bulk and tone, full strength in the BUE, BLE, on the right dense spastic left hemiplegia 0 out of 5 with flexion contractures in the left hand spasticity left upper and lower extremity  Sensory: normal and symmetric to light touch, pinprick, and  Vibration in the upper and lower extremities on the right diminished on the left Coordination: finger-nose-finger, heel-to-shin bilaterally, performed accurately on the right unable on the left  Reflexes: 2+ on the right brisker on the left plantar  responses were downgoing on the right and upgoing on the left  Gait and Station: [employer] DIAGNOSTIC DATA (LABS, IMAGING, TESTING) - I reviewed patient records, labs, notes, testing and imaging myself where available.  Lab Results  Component Value Date   WBC 8.7 03/28/2018   HGB 14.4 03/28/2018   HCT 47.1 03/28/2018   MCV 91.3 03/28/2018   PLT 167 03/28/2018      Component Value Date/Time   NA 140 03/28/2018 0700   K 4.1 03/28/2018 0700   CL 108 03/28/2018 0700   CO2 27 03/28/2018 0700   GLUCOSE 86 03/28/2018 0700   BUN 16 03/28/2018 0700   CREATININE 0.72 03/28/2018 0700   CALCIUM 9.3 03/28/2018 0700   CALCIUM 11.5 (H) 09/05/2017 0709   PROT 6.8 10/17/2017 0710   ALBUMIN 3.1 (L) 10/17/2017 0710   AST 16 10/17/2017 0710   ALT 35 10/17/2017 0710   ALKPHOS 49 10/17/2017 0710   BILITOT 0.4 10/17/2017 0710   GFRNONAA >60 03/28/2018 0700   GFRAA >60 03/28/2018 0700   Lab Results  Component Value Date   CHOL 149 12/06/2017   HDL 27 (L) 12/06/2017   LDLCALC 67 12/06/2017   TRIG 276 (H) 12/06/2017   CHOLHDL 5.5 12/06/2017   Lab Results  Component Value Date   HGBA1C 4.5 (L) 12/06/2017   Lab Results  Component Value Date   VITAMINB12 519 04/04/2017   Lab Results  Component Value Date   TSH 1.524 02/06/2018      ASSESSMENT AND PLAN 61 year African-American male with 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  Hospital readmission on 09/24/2017 for sepsis and perianal abscess.The patient is a current patient of Dr. Leonie Man  who is out of the office today . This note is sent to the work in doctor.       PLAN:Stressed the importance of management of risk factors to prevent further stroke Continue Eliquis for secondary stroke prevention and atrial fibrillation Maintain strict  control of hypertension with blood pressure  goal below 130/90, today's reading 151/94  continue antihypertensive medications Control of diabetes with hemoglobin A1c below 6.5 followed by primary care  continue diabetic medications most recent hemoglobin A1c 4.5 on 12/06/2017 Cholesterol with LDL cholesterol less than 70, followed by primary care,   Wheelchair-bound at risk for falls,  Continue current  diet and PEG  feedings at night  Follow-up with neurosurgery for cranioplasty of the right skull defect has been delayed  Follow-up in stroke clinic 8 months I spent 25 minutes in total face to face time with the patient/ CMA more than 50% of which was spent counseling and coordination of care, reviewing test results reviewing medications and discussing and reviewing the diagnosis of  ,significant residual left hemiplegia, atrial fibrillation,, risk for recurrent stroke/TIAs.  Patient made aware he needs 24/7 care, totally dependent and not able to be discharged to home at this time. Dennie Bible, [facility], [facility], APRN  [facility] [address] [place], [place] [address] [phone]

## 2018-04-12 ENCOUNTER — Encounter: Payer: Self-pay | Admitting: Nurse Practitioner

## 2018-04-12 ENCOUNTER — Encounter

## 2018-04-12 ENCOUNTER — Ambulatory Visit: Payer: Medicaid Other | Admitting: Nurse Practitioner

## 2018-04-12 VITALS — BP 151/94 | HR 91 | Ht 66.0 in

## 2018-04-12 DIAGNOSIS — Z8673 Personal history of transient ischemic attack (TIA), and cerebral infarction without residual deficits: Secondary | ICD-10-CM | POA: Insufficient documentation

## 2018-04-12 DIAGNOSIS — I480 Paroxysmal atrial fibrillation: Secondary | ICD-10-CM

## 2018-04-12 DIAGNOSIS — I69354 Hemiplegia and hemiparesis following cerebral infarction affecting left non-dominant side: Secondary | ICD-10-CM

## 2018-04-12 DIAGNOSIS — I10 Essential (primary) hypertension: Secondary | ICD-10-CM | POA: Insufficient documentation

## 2018-04-12 NOTE — Patient Instructions (Signed)
Per skilled sheet 

## 2018-04-16 NOTE — Progress Notes (Signed)
I have reviewed and agreed above plan. 

## 2018-05-28 ENCOUNTER — Non-Acute Institutional Stay (SKILLED_NURSING_FACILITY): Payer: Medicaid Other | Admitting: Internal Medicine

## 2018-05-28 ENCOUNTER — Encounter: Payer: Self-pay | Admitting: Internal Medicine

## 2018-05-28 DIAGNOSIS — E1149 Type 2 diabetes mellitus with other diabetic neurological complication: Secondary | ICD-10-CM | POA: Diagnosis not present

## 2018-05-28 DIAGNOSIS — I69354 Hemiplegia and hemiparesis following cerebral infarction affecting left non-dominant side: Secondary | ICD-10-CM

## 2018-05-28 DIAGNOSIS — I10 Essential (primary) hypertension: Secondary | ICD-10-CM | POA: Diagnosis not present

## 2018-05-28 DIAGNOSIS — Z9889 Other specified postprocedural states: Secondary | ICD-10-CM

## 2018-05-28 DIAGNOSIS — I480 Paroxysmal atrial fibrillation: Secondary | ICD-10-CM | POA: Diagnosis not present

## 2018-05-28 DIAGNOSIS — Z931 Gastrostomy status: Secondary | ICD-10-CM

## 2018-05-28 NOTE — Progress Notes (Signed)
Location:    [place] Room Number: 104/W Place of Service:  SNF [id number]) Provider:  Freddi Starr, MD  Patient Care Team: Virgie Dad, MD as PCP - General (Internal Medicine)  Extended Emergency Contact Information Primary Emergency Contact: Theresia Bough [place] of [place] Mobile Phone: [phone] Relation: Sister Secondary Emergency Contact: Nickie Retort [place] Mobile Phone: [phone] Relation: Aunt  Code Status:  Full Code Goals of care: Advanced Directive information Advanced Directives 05/28/2018  Does Patient Have a Medical Advance Directive? Yes  Type of Advance Directive (No Data)  Does patient want to make changes to medical advance directive? No - Patient declined  Would patient like information on creating a medical advance directive? No - Patient declined     Chief Complaint  Patient presents with  . Acute Visit    Left foot drop  As well as follow-up of diabetes type 2- history of CVA with left-sided hemiparalysis- as well as atrial fibrillation and hypercalcemia  HPI:  Pt is a 57 y.o. male seen today for an acute visit for left foot drop as well as follow-up of conditions as noted above.  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  Despite his multiple medical diagnoses he appears to be doing pretty well here.  He does have a left foot droop which may be slowly progressing- he does not really complain of pain with this again this is the  result of his left-sided hemiparalysis.  At one point he was seen by physical therapy but apparently this was discontinued because of noncompliance we will try to see if this can be addressed by therapy.  In regards to his history of diabetes his hemoglobin A1c in May was 4.5 he is on Levemir 10 units in the morning- a.m. blood sugars appear to run from 79 to the low 100s- evening blood sugars run largely in the low 100s occasionally in the 90s I do see 1 of 65.  He has a history of A. fib this appears rate controlled on Lopressor he continues on Eliquis for anticoagulation he is also on this for his history of CVA.  I do note per note from the neurologic nurse practitioner who saw him in September that goal cholesterol was less then an LDL of 70 it was 67 on lab done in May we will update this.  He does continue on PEG tube feeding as well as some oral feeding with a history of dysphasia he is actually gained about 5 pounds since August.  He also has some history of hypercalcemia thought secondary to immobility but this appears to have stabilized calcium was 9.3 on lab done in August will update this as well.  Currently he is sitting in his wheelchair comfortably appears to be doing well with supportive care  Past Medical History:  Diagnosis Date  . DM (diabetes mellitus) ([place])   . Hypertension   . Stroke [facility])    Past Surgical History:  Procedure Laterality Date  . CRANIECTOMY Right 04/01/2017   Procedure: RIGHT DECOMPRESSIVE CRANIECTOMY;  Surgeon:  Ditty, Kevan Ny, MD;  Location: [place];  Service: Neurosurgery;  Laterality: Right;  . ESOPHAGOGASTRODUODENOSCOPY N/A 04/13/2017   Procedure: ESOPHAGOGASTRODUODENOSCOPY (EGD);  Surgeon: Georganna Skeans, MD;  Location: [facility];  Service: General;  Laterality: N/A;  bedside  . INCISION AND DRAINAGE PERIRECTAL ABSCESS N/A 09/28/2017   Procedure: IRRIGATION AND DEBRIDEMENT PERIANAL ABSCESS;  Surgeon: Virl Cagey, MD;  [facility];  Service: General;  Laterality: N/A;  . PEG PLACEMENT N/A 04/13/2017   Procedure: PERCUTANEOUS ENDOSCOPIC GASTROSTOMY (PEG) PLACEMENT;  Surgeon: Georganna Skeans, MD;  Location: [facility] ENDOSCOPY;  Service: General;  Laterality: N/A;    Allergies  Allergen Reactions  . Cheese   . Penicillins Hives    Has patient had a PCN reaction causing immediate rash, facial/tongue/throat swelling, SOB or lightheadedness with hypotension: Unknown Has patient had a PCN reaction causing severe rash involving mucus membranes or skin necrosis: Unknown Has patient had a PCN reaction that required hospitalization: No Has patient had a PCN reaction occurring within the last 10 years: No If all of the above answers are "NO", then may proceed with Cephalosporin use.     Outpatient Encounter Medications as of 05/28/2018  Medication Sig  . acetaminophen (TYLENOL) 325 MG tablet Place 650 mg into feeding tube every 4 (four) hours as needed.  . Amino Acids-Protein Hydrolys (FEEDING SUPPLEMENT, PRO-STAT SUGAR FREE 64,) LIQD Place 30 mLs into feeding tube 2 (two) times daily between meals. Per gastric tube   . apixaban (ELIQUIS) 5 MG TABS tablet Place 1 tablet (5 mg total) into feeding tube 2 (two) times daily.  . bisacodyl (DULCOLAX) 10 MG suppository Place 1 suppository (10 mg total) rectally daily as needed for moderate constipation.  . Cholecalciferol (VITAMIN D) 2000 units CAPS Take 1 capsule (2,000 Units total) by mouth daily.  . coal tar (NEUTROGENA T-GEL) 0.5 % shampoo Apply 1 application topically See admin instructions. Apply to scalp on shower days twice a week Mon and Thur  . docusate (COLACE) 50 MG/5ML liquid Place 10 mLs (100 mg total) into feeding tube 2 (two) times daily as needed for mild constipation.  . gabapentin (NEURONTIN) 100 MG capsule 200 mg at bedtime. Feeding tube  . insulin detemir (LEVEMIR) 100 UNIT/ML injection Inject 10 Units into the skin daily.   . metoprolol tartrate (LOPRESSOR) 25 MG  tablet Place 0.5 tablets (12.5 mg total) into feeding tube 2 (two) times daily.  Marland Kitchen omeprazole (PRILOSEC) 40 MG capsule Take 40 mg by mouth 2 (two) times daily.  . polyethylene glycol (MIRALAX / GLYCOLAX) packet Place 17 g into feeding tube daily as needed.  . Water For Irrigation, Sterile (FREE WATER) SOLN Place 300 mLs into feeding tube every 4 (four) hours. Keep HOB elevated at 45 degrees at all times during feedings, flushings, and medications 12a, 4a, 8a, 12p, 4p,8p  . [DISCONTINUED] mirtazapine (REMERON) 15 MG tablet Place 7.5 mg into feeding tube at bedtime.   . [DISCONTINUED] pantoprazole sodium (PROTONIX) 40 mg/20 mL PACK Place 20 mLs (40 mg total) into feeding tube daily.   No facility-administered encounter medications on file as of 05/28/2018.     Review of Systems   General is not complaining any fever or chills he has gained a small amount of weight.  Skin is not complain of rashes or itching the deformity right side of his head appears to be slowly decreasing.  Head ears eyes nose mouth and throat does not complain of visual changes from baseline- not complaining of a sore  throat--.  At times has complained of a headache but is complaining of this less frequently  Respiratory does not complain of shortness of breath or cough.  Cardiac does not complain of chest pain or significant lower extremity edema.  GI is not complaining of abdominal discomfort nausea vomiting diarrhea constipation appears to be tolerating PEG feedings-again nursing staff is trying to promote more by mouth intake and this has improved  GU is not complaining of dysuria.  Musculoskeletal continues with left-sided hemiparalysis does not complain of joint pain currently.  Neurologic is not complaining of dizziness or syncope headache complaints have improved somewhat-continues on Neurontin with a history of neuropathy.  Psych does not complain of being anxious or depressed appears to be doing well with  supportive care      There is no immunization history on file for this patient. Pertinent  Health Maintenance Due  Topic Date Due  . INFLUENZA VACCINE  06/10/2018 (Originally 03/08/2018)  . OPHTHALMOLOGY EXAM  06/28/2018 (Originally 04/06/1971)  . URINE MICROALBUMIN  06/28/2018 (Originally 04/06/1971)  . HEMOGLOBIN A1C  06/08/2018  . FOOT EXAM  08/24/2018  . COLONOSCOPY  Discontinued   Fall Risk  10/10/2017 09/13/2017 09/04/2017  Falls in the past year? No No No  Risk for fall due to : - Impaired balance/gait;Impaired mobility;Medication side effect;Mental status change -   Functional Status Survey:    Vitals:   05/28/18 1423  BP: 136/70  Pulse: 67  Resp: 20  Temp: 98.6 F (37 C)  TempSrc: Oral  SpO2: 99%  Weight is 164 pounds Physical Exam   In general this is a pleasant well-nourished middle-age male in no distress sitting comfortably in his wheelchair.  His skin is warm and dry.  Eyes visual acuity appears to be at previous baseline sclera and conjunctive are clear.  Oropharynx is clear mucous membranes moist.  Chest is clear to auscultation there is no labored breathing.  Heart is regular rate and rhythm without murmur gallop or rub he has minimal lower extremity edema on the left side.  Abdomen is soft nontender with positive bowel sounds PEG site is currently covered this appears to be unremarkable.  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  Moves his right upper and lower extremities at baseline.  Neurologic as noted above he does have left-sided hemiparesis and some left facial weakness at baseline.  Psych he is pleasant alert and largely oriented  Labs reviewed: Recent Labs    09/05/17 0709 09/24/17 1527  09/25/17 0545  12/26/17 0700 01/19/18 1735 03/28/18 0700  NA  --  140   < >  --    < > 139 143 140  K  --  4.2   < >  --    < > 4.2 4.6 4.1  CL   --  103   < >  --    < > 105 108 108  CO2  --  26   < >  --    < > 27 29 27   GLUCOSE  --  141*   < >  --    < > 91 88 86  BUN  --  24*   < >  --    < > 17 15 16   CREATININE  --  0.90   < >  --    < > 0.70 0.80 0.72  CALCIUM 11.5* 9.2   < >  --    < >  10.0 10.1 9.3  MG 2.1  --   --  2.1  --   --   --   --   PHOS 3.7 2.2*  --   --   --   --   --   --    < > = values in this interval not displayed.   Recent Labs    09/24/17 1527 10/10/17 0714 10/17/17 0710  AST 34 22 16  ALT 59 46 35  ALKPHOS 102 53 49  BILITOT 1.0 0.3 0.4  PROT 8.3* 7.6 6.8  ALBUMIN 2.6* 3.2* 3.1*   Recent Labs    12/26/17 0700 01/19/18 1735 03/28/18 0700  WBC 10.0 10.9* 8.7  NEUTROABS 5.0 6.0 4.5  HGB 14.5 14.4 14.4  HCT 47.8 47.9 47.1  MCV 89.0 89.7 91.3  PLT 174 198 167   Lab Results  Component Value Date   TSH 1.524 02/06/2018   Lab Results  Component Value Date   HGBA1C 4.5 (L) 12/06/2017   Lab Results  Component Value Date   CHOL 149 12/06/2017   HDL 27 (L) 12/06/2017   LDLCALC 67 12/06/2017   TRIG 276 (H) 12/06/2017   CHOLHDL 5.5 12/06/2017    Significant Diagnostic Results in last 30 days:  No results found.  Assessment/Plan  #1-history of CVA with resulting left side hemiparesis-with left foot drop- will order therapy to take a look at this to see if they could help help alleviate the foot drop- also spoke with him that he may benefit from therapy if he will be compliant- at this point will wait therapy evaluation.  He continues on Eliquis for anticoagulation.  Per review of neurology note goal LDL is less than 70 it was 67 in May will have this updated.  Also goal blood pressure was less than 130/90- recent readings today 136/70 but previous readings have been 126/62 and 121/71- at this point will continue to monitor I do not see consistent elevations over 130 but this will have to be watched he continues on Lopressor.  2.  Hypertension please see above.  3 hyperlipidemia he  is currently not on an agent but LDL appears to be satisfactory will have this updated.  4.  History of atrial fibrillation this appears rate controlled on Lopressor he is on Eliquis for anticoagulation.  5.-History of dysphasia continues on p.o. feedings as well as feedings via PEG tube these appear to be well tolerated-staff is encouraging increased p.o. intake.-  His weight appears to be trending up.  6.-  History of hypercalcemia- last calcium was 9.3 on August lab this appears to be stable will have this updated as well.  7.  History of cranioplasty- with right skull defect- he will need follow-up by neurosurgery apparently this is in [place]-this appears to  becoming less prominent  #8 diabetes-thought to be type II- blood sugars show some stability actually some low readings in the morning-this was discussed with Dr. Lyndel Safe and will discontinue the Levemir and start low-dose Glucophage 500 mg every morning and continue to monitor blood sugars.  9.  Diabetic neuropathy he is on Neurontin but this appears to be helping with any pain issues.  10.  History of constipation continues on Colace routinely and Dulcolax as needed this appears stable as well  Again will update lab work including a lipid panel hemoglobin N4O CBC and metabolic panel to assess calcium level.  [id number] note greater than 40 minutes spent assessing patient-discussing his status with nursing staff-  and coordinating formulating a plan of care for numerous diagnoses- of note greater than 50% of time spent coordinating a plan of care with input as noted above

## 2018-05-29 ENCOUNTER — Encounter (HOSPITAL_COMMUNITY)
Admission: RE | Admit: 2018-05-29 | Discharge: 2018-05-29 | Disposition: A | Discharge status: Home / Self Care | Payer: Medicaid Other | Source: Skilled Nursing Facility | Attending: Internal Medicine | Admitting: Internal Medicine

## 2018-05-29 DIAGNOSIS — I63311 Cerebral infarction due to thrombosis of right middle cerebral artery: Secondary | ICD-10-CM | POA: Insufficient documentation

## 2018-05-29 LAB — BASIC METABOLIC PANEL
Anion gap: 6 (ref 5–15)
BUN: 17 mg/dL (ref 6–20)
CALCIUM: 9.3 mg/dL (ref 8.9–10.3)
CO2: 26 mmol/L (ref 22–32)
Chloride: 109 mmol/L (ref 98–111)
Creatinine, Ser: 0.71 mg/dL (ref 0.61–1.24)
Glucose, Bld: 92 mg/dL (ref 70–99)
Potassium: 4 mmol/L (ref 3.5–5.1)
SODIUM: 141 mmol/L (ref 135–145)

## 2018-05-29 LAB — CBC WITH DIFFERENTIAL/PLATELET
Abs Immature Granulocytes: 0.03 10*3/uL (ref 0.00–0.07)
Basophils Absolute: 0.1 10*3/uL (ref 0.0–0.1)
Basophils Relative: 1 %
EOS PCT: 3 %
Eosinophils Absolute: 0.4 10*3/uL (ref 0.0–0.5)
HEMATOCRIT: 43.1 % (ref 39.0–52.0)
Hemoglobin: 12.8 g/dL — ABNORMAL LOW (ref 13.0–17.0)
Immature Granulocytes: 0 %
Lymphocytes Relative: 38 %
Lymphs Abs: 4.1 10*3/uL — ABNORMAL HIGH (ref 0.7–4.0)
MCH: 27.6 pg (ref 26.0–34.0)
MCHC: 29.7 g/dL — AB (ref 30.0–36.0)
MCV: 92.9 fL (ref 80.0–100.0)
MONOS PCT: 8 %
Monocytes Absolute: 0.8 10*3/uL (ref 0.1–1.0)
Neutro Abs: 5.4 10*3/uL (ref 1.7–7.7)
Neutrophils Relative %: 50 %
PLATELETS: 194 10*3/uL (ref 150–400)
RBC: 4.64 MIL/uL (ref 4.22–5.81)
RDW: 14.4 % (ref 11.5–15.5)
WBC: 10.9 10*3/uL — ABNORMAL HIGH (ref 4.0–10.5)
nRBC: 0 % (ref 0.0–0.2)

## 2018-05-29 LAB — LIPID PANEL
CHOL/HDL RATIO: 5.9 ratio
Cholesterol: 165 mg/dL (ref 0–200)
HDL: 28 mg/dL — AB (ref >40)
LDL CALC: 73 mg/dL (ref 0–99)
TRIGLYCERIDES: 321 mg/dL — AB (ref <150)
VLDL: 64 mg/dL — AB (ref 0–40)

## 2018-05-30 LAB — HEMOGLOBIN A1C
Hgb A1c MFr Bld: 4.7 % — ABNORMAL LOW (ref 4.8–5.6)
MEAN PLASMA GLUCOSE: 88 mg/dL

## 2018-06-12 ENCOUNTER — Encounter (HOSPITAL_COMMUNITY)
Admission: RE | Admit: 2018-06-12 | Discharge: 2018-06-12 | Disposition: A | Discharge status: Home / Self Care | Payer: Medicaid Other | Source: Skilled Nursing Facility | Attending: Internal Medicine | Admitting: Internal Medicine

## 2018-06-12 DIAGNOSIS — I10 Essential (primary) hypertension: Secondary | ICD-10-CM | POA: Diagnosis not present

## 2018-06-12 DIAGNOSIS — E069 Thyroiditis, unspecified: Secondary | ICD-10-CM | POA: Diagnosis not present

## 2018-06-12 DIAGNOSIS — I63311 Cerebral infarction due to thrombosis of right middle cerebral artery: Secondary | ICD-10-CM | POA: Diagnosis present

## 2018-06-12 DIAGNOSIS — E119 Type 2 diabetes mellitus without complications: Secondary | ICD-10-CM | POA: Diagnosis not present

## 2018-06-12 DIAGNOSIS — I4891 Unspecified atrial fibrillation: Secondary | ICD-10-CM | POA: Insufficient documentation

## 2018-06-12 LAB — CBC WITH DIFFERENTIAL/PLATELET
ABS IMMATURE GRANULOCYTES: 0.02 10*3/uL (ref 0.00–0.07)
BASOS ABS: 0.1 10*3/uL (ref 0.0–0.1)
Basophils Relative: 1 %
Eosinophils Absolute: 0.3 10*3/uL (ref 0.0–0.5)
Eosinophils Relative: 4 %
HEMATOCRIT: 49.1 % (ref 39.0–52.0)
Hemoglobin: 14.1 g/dL (ref 13.0–17.0)
Immature Granulocytes: 0 %
LYMPHS ABS: 3 10*3/uL (ref 0.7–4.0)
LYMPHS PCT: 37 %
MCH: 27 pg (ref 26.0–34.0)
MCHC: 28.7 g/dL — ABNORMAL LOW (ref 30.0–36.0)
MCV: 93.9 fL (ref 80.0–100.0)
MONOS PCT: 7 %
Monocytes Absolute: 0.6 10*3/uL (ref 0.1–1.0)
NEUTROS ABS: 4.1 10*3/uL (ref 1.7–7.7)
NEUTROS PCT: 51 %
NRBC: 0 % (ref 0.0–0.2)
Platelets: 199 10*3/uL (ref 150–400)
RBC: 5.23 MIL/uL (ref 4.22–5.81)
RDW: 14.4 % (ref 11.5–15.5)
WBC: 8.2 10*3/uL (ref 4.0–10.5)

## 2018-06-18 ENCOUNTER — Non-Acute Institutional Stay (SKILLED_NURSING_FACILITY): Payer: Medicaid Other | Admitting: Internal Medicine

## 2018-06-18 ENCOUNTER — Encounter: Payer: Self-pay | Admitting: Internal Medicine

## 2018-06-18 DIAGNOSIS — Z9889 Other specified postprocedural states: Secondary | ICD-10-CM | POA: Diagnosis not present

## 2018-06-18 DIAGNOSIS — I10 Essential (primary) hypertension: Secondary | ICD-10-CM | POA: Diagnosis not present

## 2018-06-18 DIAGNOSIS — I480 Paroxysmal atrial fibrillation: Secondary | ICD-10-CM | POA: Diagnosis not present

## 2018-06-18 DIAGNOSIS — E108 Type 1 diabetes mellitus with unspecified complications: Secondary | ICD-10-CM

## 2018-06-18 NOTE — Progress Notes (Signed)
Location:    [place] Room Number: 104/W Place of Service:  SNF [phone]) Provider:  Veleta Miners MD  Virgie Dad, MD  Patient Care Team: Virgie Dad, MD as PCP - General (Internal Medicine)  Extended Emergency Contact Information Primary Emergency Contact: Theresia Bough [place] of [place] Phone: [phone] Relation: Sister Secondary Emergency Contact: Nickie Retort [place] of [place] Mobile Phone: [phone] Relation: Aunt  Code Status:  Full Code Goals of care: Advanced Directive information Advanced Directives 06/18/2018  Does Patient Have a Medical Advance Directive? Yes  Type of Advance Directive (No Data)  Does patient want to make changes to medical advance directive? No - Patient declined  Would patient like information on creating a medical advance directive? No - Patient declined     Chief Complaint  Patient presents with  . Medical Management of Chronic Issues    Routine visit of medical management    HPI:  Pt is a 57 y.o. male seen today for medical management of chronic diseases.    Patient has h/o Diabetes , Hypertension, Acute Large Right MCA CVA with Left Hemiparesis,S/P Decompressive right frontotemporoparietal craniectomy for decompression with duraplastyon 08/18.,S/P Peg Tube , Hypercalcemia due to immobility.And sepsis due to Gluteal Abscess Requiring Surgical Debridement in 02/19.  Patient is doing well in Facility and Has gained Weight and is now 165 lbs.He is on Regular diet. Per Nurses they are still using the PEG tube for Medicines ? As he refuses to take them PO. He did c/o Pain in his Left UE and LE. Denies any other problems.     Past Medical History:  Diagnosis Date  . DM (diabetes mellitus) ([place])   . Hypertension   . Stroke [facility])    Past Surgical History:  Procedure Laterality Date  . CRANIECTOMY Right 04/01/2017   Procedure: RIGHT DECOMPRESSIVE CRANIECTOMY;  Surgeon:  Ditty, Kevan Ny, MD;  Location: [place];  Service: Neurosurgery;  Laterality: Right;  . ESOPHAGOGASTRODUODENOSCOPY N/A 04/13/2017   Procedure: ESOPHAGOGASTRODUODENOSCOPY (EGD);  Surgeon: Georganna Skeans, MD;  Location: [place];  Service: General;  Laterality: N/A;  bedside  . INCISION AND DRAINAGE PERIRECTAL ABSCESS N/A 09/28/2017   Procedure: IRRIGATION AND DEBRIDEMENT PERIANAL ABSCESS;  Surgeon: Virl Cagey, MD;  Location: [facility];  Service: General;  Laterality: N/A;  . PEG PLACEMENT N/A 04/13/2017   Procedure: PERCUTANEOUS ENDOSCOPIC GASTROSTOMY (PEG) PLACEMENT;  Surgeon: Georganna Skeans, MD;  Location: [facility] ENDOSCOPY;  Service: General;  Laterality: N/A;    Allergies  Allergen Reactions  . Cheese   . Penicillins Hives    Has patient had a PCN reaction causing immediate rash, facial/tongue/throat swelling, SOB or lightheadedness with hypotension: Unknown Has patient had a PCN reaction causing severe rash involving mucus membranes or skin necrosis: Unknown Has patient had a PCN reaction that required hospitalization: No Has patient had a PCN reaction occurring within the last 10 years: No If all of the above answers are "NO", then may proceed with Cephalosporin use.     Outpatient Encounter Medications as of 06/18/2018  Medication Sig  . acetaminophen (TYLENOL) 325 MG tablet Place 650 mg into feeding tube every 4 (four) hours as needed.  . Amino Acids-Protein Hydrolys (FEEDING SUPPLEMENT, PRO-STAT SUGAR FREE 64,) LIQD Place 30 mLs into feeding tube 2 (two) times daily between meals. Per gastric tube   . apixaban (ELIQUIS) 5 MG TABS tablet Place 1 tablet (5 mg total) into feeding tube 2 (two) times daily.  Marland Kitchen atorvastatin (  LIPITOR) 10 MG tablet Take 5 mg by mouth daily.  . bisacodyl (DULCOLAX) 10 MG suppository Place 1 suppository (10 mg total) rectally daily as needed for moderate constipation.  . Cholecalciferol (VITAMIN D) 2000 units CAPS Take 1 capsule (2,000 Units total) by  mouth daily.  . coal tar (NEUTROGENA T-GEL) 0.5 % shampoo Apply 1 application topically See admin instructions. Apply to scalp on shower days twice a week Mon and Thur  . docusate (COLACE) 50 MG/5ML liquid Place 10 mLs (100 mg total) into feeding tube 2 (two) times daily as needed for mild constipation.  . gabapentin (NEURONTIN) 100 MG capsule 200 mg at bedtime. Feeding tube  . metFORMIN (GLUCOPHAGE) 500 MG tablet Take 500 mg by mouth daily.  . metoprolol tartrate (LOPRESSOR) 25 MG tablet Place 0.5 tablets (12.5 mg total) into feeding tube 2 (two) times daily.  Marland Kitchen omeprazole (PRILOSEC) 40 MG capsule Take 40 mg by mouth 2 (two) times daily.  . polyethylene glycol (MIRALAX / GLYCOLAX) packet Place 17 g into feeding tube daily as needed.  . Water For Irrigation, Sterile (FREE WATER) SOLN Place 300 mLs into feeding tube every 4 (four) hours. Keep HOB elevated at 45 degrees at all times during feedings, flushings, and medications 12a, 4a, 8a, 12p, 4p,8p  . [DISCONTINUED] insulin detemir (LEVEMIR) 100 UNIT/ML injection Inject 10 Units into the skin daily.    No facility-administered encounter medications on file as of 06/18/2018.      Review of Systems  Constitutional: Negative.   HENT: Negative.   Respiratory: Negative.   Cardiovascular: Negative.   Gastrointestinal: Negative.   Genitourinary: Negative.   Musculoskeletal: Positive for arthralgias.  Skin: Negative.   Neurological: Negative.   Psychiatric/Behavioral: Negative.      There is no immunization history on file for this patient. Pertinent  Health Maintenance Due  Topic Date Due  . OPHTHALMOLOGY EXAM  06/28/2018 (Originally 04/06/1971)  . URINE MICROALBUMIN  06/28/2018 (Originally 04/06/1971)  . INFLUENZA VACCINE  07/18/2018 (Originally 03/08/2018)  . FOOT EXAM  08/24/2018  . HEMOGLOBIN A1C  11/28/2018  . COLONOSCOPY  Discontinued   Fall Risk  10/10/2017 09/13/2017 09/04/2017  Falls in the past year? No No No  Risk for fall due to :  - Impaired balance/gait;Impaired mobility;Medication side effect;Mental status change -   Functional Status Survey:    Vitals:   06/18/18 1150  BP: 118/70  Pulse: 68  Resp: 18  Temp: 98.9 F (37.2 C)  TempSrc: Oral  SpO2: 97%  Weight: 165 lb 3.2 oz (74.9 kg)  Height: 5\' 6"  (1.676 m)   Body mass index is 26.66 kg/m. Physical Exam  Constitutional: He appears well-developed and well-nourished.  HENT:  Mouth/Throat: Oropharynx is clear and moist.  Has protruding Area on head  Eyes: Pupils are equal, round, and reactive to light.  Neck: Neck supple.  Cardiovascular: Regular rhythm.  Pulmonary/Chest: Effort normal and breath sounds normal. No stridor. No respiratory distress. He has no wheezes.  Abdominal: Soft. Bowel sounds are normal. He exhibits no distension. There is no tenderness. There is no guarding.  Peg Site was Normal   Musculoskeletal: He exhibits no edema.  Neurological: He is alert.  Has Left Hemiparesis with some stiffness in his Extremities.    Labs reviewed: Recent Labs    09/05/17 0709 09/24/17 1527  09/25/17 0545  01/19/18 1735 03/28/18 0700 05/29/18 0335  NA  --  140   < >  --    < > 143 140 141  K  --  4.2   < >  --    < > 4.6 4.1 4.0  CL  --  103   < >  --    < > 108 108 109  CO2  --  26   < >  --    < > 29 27 26   GLUCOSE  --  141*   < >  --    < > 88 86 92  BUN  --  24*   < >  --    < > 15 16 17   CREATININE  --  0.90   < >  --    < > 0.80 0.72 0.71  CALCIUM 11.5* 9.2   < >  --    < > 10.1 9.3 9.3  MG 2.1  --   --  2.1  --   --   --   --   PHOS 3.7 2.2*  --   --   --   --   --   --    < > = values in this interval not displayed.   Recent Labs    09/24/17 1527 10/10/17 0714 10/17/17 0710  AST 34 22 16  ALT 59 46 35  ALKPHOS 102 53 49  BILITOT 1.0 0.3 0.4  PROT 8.3* 7.6 6.8  ALBUMIN 2.6* 3.2* 3.1*   Recent Labs    03/28/18 0700 05/29/18 0335 06/12/18 0700  WBC 8.7 10.9* 8.2  NEUTROABS 4.5 5.4 4.1  HGB 14.4 12.8* 14.1  HCT 47.1  43.1 49.1  MCV 91.3 92.9 93.9  PLT 167 194 199   Lab Results  Component Value Date   TSH 1.524 02/06/2018   Lab Results  Component Value Date   HGBA1C 4.7 (L) 05/29/2018   Lab Results  Component Value Date   CHOL 165 05/29/2018   HDL 28 (L) 05/29/2018   LDLCALC 73 05/29/2018   TRIG 321 (H) 05/29/2018   CHOLHDL 5.9 05/29/2018    Significant Diagnostic Results in last 30 days:  No results found.  Assessment/Plan  Diabetes mellitus with complications Last B9U was 4.7 in 10/19 BS are running mostly less then 150 Levemir was Discontinued and now he is only on Metformin Accu Checks QD AF (paroxysmal atrial fibrillation) In Sinus rhythm On Eliquis and Metoprolol  Cerebrovascular accident (CVA) due to embolism of right middle cerebral arteryS/P Craniectomy On Eliquis Continues to be full Nursing care LDL  73 in 10/19 Started on Statin BP Controlled Follows withNeurology Also Has Follow up with Neurosurgery for Craniectomy.in 6 months CT scan schedule in Feb  Essential hypertension On Metoprolol  S/PPEG Placement Discontinue PEG Feeds On regular diet now Weight stable Will now Clamp Tube.And possible removal eventually Status post craniectomy ArrangeFollow up With Neurosurgery Hypercalcemia Received one Dose of IV reclast Thought to be due to [place] with Dr Dorris Fetch. Calcium stopped . On Lower dose of Vit D Calcium staying stable Gluteal wound Completely resolved Left Extremities Pain  Most Likely due to Spasticity Will start on Baclofen 5 Mg BID Already on Neurontin. Family/ staff Communication:   Labs/tests ordered:

## 2018-06-21 ENCOUNTER — Non-Acute Institutional Stay (SKILLED_NURSING_FACILITY): Payer: Medicaid Other | Admitting: Internal Medicine

## 2018-06-21 ENCOUNTER — Encounter: Payer: Self-pay | Admitting: Internal Medicine

## 2018-06-21 DIAGNOSIS — T7840XA Allergy, unspecified, initial encounter: Secondary | ICD-10-CM | POA: Diagnosis not present

## 2018-06-21 DIAGNOSIS — E1149 Type 2 diabetes mellitus with other diabetic neurological complication: Secondary | ICD-10-CM

## 2018-06-21 DIAGNOSIS — I69354 Hemiplegia and hemiparesis following cerebral infarction affecting left non-dominant side: Secondary | ICD-10-CM | POA: Diagnosis not present

## 2018-06-21 DIAGNOSIS — R4702 Dysphasia: Secondary | ICD-10-CM | POA: Diagnosis not present

## 2018-06-21 DIAGNOSIS — I10 Essential (primary) hypertension: Secondary | ICD-10-CM

## 2018-06-21 NOTE — Progress Notes (Signed)
Location:    [facility] Room Number: 104/W Place of Service:  SNF [phone]) Provider:  Freddi Starr, MD  Patient Care Team: Virgie Dad, MD as PCP - General (Internal Medicine)  Extended Emergency Contact Information Primary Emergency Contact: Theresia Bough [place] of [place] Mobile Phone: [phone] Relation: Sister Secondary Emergency Contact: Nickie Retort [place] of [place] Mobile Phone: [phone] Relation: Aunt  Code Status:  Full Code Goals of care: Advanced Directive information Advanced Directives 06/21/2018  Does Patient Have a Medical Advance Directive? Yes  Type of Advance Directive (No Data)  Does patient want to make changes to medical advance directive? No - Patient declined  Would patient like information on creating a medical advance directive? No - Patient declined     Chief Complaint  Patient presents with  . Acute Visit    Allergies    HPI:  Pt is a 57 y.o. male seen today for an acute visit for complaints of runny nose with nasal stuffiness and suspected allergy symptoms.  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  He does not really complain of any chest congestion or significant cough says he does cough at times---but symptoms  Appear  to be more in the nose and eyes-.  He is afebrile vital signs appear to be stable blood pressure is mildly elevated today but he has been somewhat upset by another resident which is probably contributing to this  He was seen earlier this week for a routine visit  and was started on  baclofen for concerns of increased left lower extremity discomfort he does have a history of left-sided hemiparalysis  Past Medical History:  Diagnosis Date  . DM (diabetes mellitus) ([place])   . Hypertension   . Stroke [facility])    Past Surgical History:  Procedure Laterality Date  . CRANIECTOMY Right 04/01/2017   Procedure: RIGHT DECOMPRESSIVE CRANIECTOMY;  Surgeon: Ditty, Kevan Ny, MD;  Location: [place];  Service: Neurosurgery;  Laterality: Right;  . ESOPHAGOGASTRODUODENOSCOPY N/A 04/13/2017   Procedure: ESOPHAGOGASTRODUODENOSCOPY (EGD);  Surgeon: Georganna Skeans, MD;  Location: [place];  Service: General;  Laterality: N/A;  bedside  . INCISION AND DRAINAGE PERIRECTAL ABSCESS N/A 09/28/2017   Procedure: IRRIGATION AND DEBRIDEMENT PERIANAL ABSCESS;  Surgeon: Virl Cagey, MD;  Location: [facility];  Service: General;  Laterality: N/A;  . PEG PLACEMENT N/A 04/13/2017   Procedure: PERCUTANEOUS ENDOSCOPIC GASTROSTOMY (PEG) PLACEMENT;  Surgeon: Georganna Skeans, MD;  Location: [facility] ENDOSCOPY;  Service: General;  Laterality: N/A;    Allergies  Allergen Reactions  . Cheese   . Penicillins Hives    Has patient had a PCN reaction causing immediate rash, facial/tongue/throat swelling, SOB or lightheadedness with hypotension: Unknown Has patient had a PCN reaction causing severe rash involving mucus membranes or skin necrosis: Unknown Has patient had a PCN reaction that required hospitalization: No Has patient had a PCN reaction occurring within the last 10 years: No If all of the above answers are "NO", then may proceed with Cephalosporin use.     Outpatient Encounter Medications as of 06/21/2018  Medication Sig  . acetaminophen (TYLENOL) 325 MG tablet Place 650 mg into feeding  tube every 4 (four) hours as needed.  . Amino Acids-Protein Hydrolys (FEEDING SUPPLEMENT, PRO-STAT SUGAR FREE 64,) LIQD Place 30 mLs into feeding tube 2 (two) times daily between meals. Per gastric  tube   . apixaban (ELIQUIS) 5 MG TABS tablet Place 1 tablet (5 mg total) into feeding tube 2 (two) times daily.  Marland Kitchen atorvastatin (LIPITOR) 10 MG tablet Take 5 mg by mouth daily.  . Baclofen 5 MG TABS Take 5 mg by mouth 2 (two) times daily.  . bisacodyl (DULCOLAX) 10 MG suppository Place 1 suppository (10 mg total) rectally daily as needed for moderate constipation.  . Cholecalciferol (VITAMIN D) 2000 units CAPS Take 1 capsule (2,000 Units total) by mouth daily.  . coal tar (NEUTROGENA T-GEL) 0.5 % shampoo Apply 1 application topically See admin instructions. Apply to scalp on shower days twice a week Tues., Fri.  . docusate (COLACE) 50 MG/5ML liquid Place 10 mLs (100 mg total) into feeding tube 2 (two) times daily as needed for mild constipation.  . gabapentin (NEURONTIN) 100 MG capsule 200 mg at bedtime. Feeding tube  . metFORMIN (GLUCOPHAGE) 500 MG tablet Take 500 mg by mouth daily.  . metoprolol tartrate (LOPRESSOR) 25 MG tablet Place 0.5 tablets (12.5 mg total) into feeding tube 2 (two) times daily.  Marland Kitchen omeprazole (PRILOSEC) 40 MG capsule Take 40 mg by mouth daily.   . polyethylene glycol (MIRALAX / GLYCOLAX) packet Place 17 g into feeding tube daily as needed.  . [DISCONTINUED] Water For Irrigation, Sterile (FREE WATER) SOLN Place 300 mLs into feeding tube every 4 (four) hours. Keep HOB elevated at 45 degrees at all times during feedings, flushings, and medications 12a, 4a, 8a, 12p, 4p,8p   No facility-administered encounter medications on file as of 06/21/2018.     Review of Systems   General is not complaining of any fever chills says he just feels relatively well.  Skin does not complain of rashes or itching or diaphoresis.  Head ears eyes nose mouth and throat is not complaining of nasal discharge and congestion and some watery eye drainage.  Respiratory does not complain of shortness of breath questionable cough at times.  Cardiac does not complain of chest pain or significant  edema.  GI is not complaining of nausea vomiting diarrhea constipation had a PEG tube but now is taking all nourishment by mouth and has tolerated this well.  GU is not complaining of dysuria.  Musculoskeletal complained of some left leg pain when he saw Dr. Lyndel Safe earlier this week but baclofen appears to be possibly be helping.  Neurologic is not complaining of dizziness headache numbness he does have a history of left-sided hemiparesis with CVA continues on Neurontin.   Psych appears to be in good spirits he is somewhat upset by another resident who has made some racially insensitive comments-    There is no immunization history on file for this patient. Pertinent  Health Maintenance Due  Topic Date Due  . OPHTHALMOLOGY EXAM  06/28/2018 (Originally 04/06/1971)  . URINE MICROALBUMIN  06/28/2018 (Originally 04/06/1971)  . INFLUENZA VACCINE  07/18/2018 (Originally 03/08/2018)  . FOOT EXAM  08/24/2018  . HEMOGLOBIN A1C  11/28/2018  . COLONOSCOPY  Discontinued   Fall Risk  10/10/2017 09/13/2017 09/04/2017  Falls in the past year? No No No  Risk for fall due to : - Impaired balance/gait;Impaired mobility;Medication side effect;Mental status change -   Functional Status Survey:     Temperature is 98.9 pulse 83 respirations of 18 blood pressure  138 over the mid 90s Physical Exam In general this is a pleasant middle-age male in no distress sitting comfortably in his chair.  His skin is warm and dry.   Eyes he appears to have some watery drainage.  Nose turbinates are clear I do not see any active drainage at this time.  Oropharynx clear mucous membranes moist.  Head-she continues to have a protruding area on his head which is baseline.  Chest is clear to auscultation there is no labored breathing.  Heart is regular rate and rhythm without murmur gallop or rub he does not have significant lower extremity edema.  Abdomen is soft nontender with positive bowel sounds PEG site in place  does not appear to be remarkable.  Musculoskeletal does continue with left-sided hemiparalysis at baseline moves his right upper upper and lower extremities at baseline.  Neurologic as noted above he is alert--he is with some left facial weakness.  Psych he is largely alert and oriented pleasant and appropriate    Labs reviewed: Recent Labs    09/05/17 0709 09/24/17 1527  09/25/17 0545  01/19/18 1735 03/28/18 0700 05/29/18 0335  NA  --  140   < >  --    < > 143 140 141  K  --  4.2   < >  --    < > 4.6 4.1 4.0  CL  --  103   < >  --    < > 108 108 109  CO2  --  26   < >  --    < > 29 27 26   GLUCOSE  --  141*   < >  --    < > 88 86 92  BUN  --  24*   < >  --    < > 15 16 17   CREATININE  --  0.90   < >  --    < > 0.80 0.72 0.71  CALCIUM 11.5* 9.2   < >  --    < > 10.1 9.3 9.3  MG 2.1  --   --  2.1  --   --   --   --   PHOS 3.7 2.2*  --   --   --   --   --   --    < > = values in this interval not displayed.   Recent Labs    09/24/17 1527 10/10/17 0714 10/17/17 0710  AST 34 22 16  ALT 59 46 35  ALKPHOS 102 53 49  BILITOT 1.0 0.3 0.4  PROT 8.3* 7.6 6.8  ALBUMIN 2.6* 3.2* 3.1*   Recent Labs    03/28/18 0700 05/29/18 0335 06/12/18 0700  WBC 8.7 10.9* 8.2  NEUTROABS 4.5 5.4 4.1  HGB 14.4 12.8* 14.1  HCT 47.1 43.1 49.1  MCV 91.3 92.9 93.9  PLT 167 194 199   Lab Results  Component Value Date   TSH 1.524 02/06/2018   Lab Results  Component Value Date   HGBA1C 4.7 (L) 05/29/2018   Lab Results  Component Value Date   CHOL 165 05/29/2018   HDL 28 (L) 05/29/2018   LDLCALC 73 05/29/2018   TRIG 321 (H) 05/29/2018   CHOLHDL 5.9 05/29/2018    Significant Diagnostic Results in last 30 days:  No results found.  Assessment/Plan  #1 suspected allergy symptoms-this appears to be confined more to his nose and eyes will do a trial course of Flonase 1 spray twice daily to each nostril  for 5 days and monitor monitor vital signs to shift for 72 hours to make sure there  is no progression of this or increased cough congestion or signs this may be something more serious.  2.  History of stiffness with left-sided hemiparesis secondary to CVA-- he has been started baclofen this appears to be helping but will have to be watched.--He continues on Eliquis for anticoagulation is also on this for atrial fibrillation  3.  Hypertension- this may be more situational per nursing his blood pressure usually runs  lower than this he is systolically in the 458K diastolically in the [id number] he is somewhat upsetat the moment  by another resident which is probably contributing to this will monitor and I have spoken with nursing to recheck this.  He is on Lopressor1 2.5 mg twice daily  #4 atrial fibrillation this appears rate controlled on the Lopressor he is on Eliquis for anticoagulation.  5 history of dysphasia he is now essentially entirely on p.o. feedings PEG feedings have been discontinued he is doing well with this he has just started taking all his food and medications p.o.- but I suspect if this continues to be a success we can remove the PEG tube--at this point site appears benign   #6 history of  diabetes- he is now only on Glucophage and this appears to be stable as well CBGs run in the 80s to 90s recently     [id number]

## 2018-06-26 ENCOUNTER — Encounter (HOSPITAL_COMMUNITY)
Admission: RE | Admit: 2018-06-26 | Discharge: 2018-06-26 | Disposition: A | Discharge status: Home / Self Care | Payer: Medicaid Other | Source: Skilled Nursing Facility | Attending: *Deleted | Admitting: *Deleted

## 2018-07-13 ENCOUNTER — Encounter: Payer: Self-pay | Admitting: Internal Medicine

## 2018-07-13 ENCOUNTER — Non-Acute Institutional Stay (SKILLED_NURSING_FACILITY): Payer: Medicaid Other | Admitting: Internal Medicine

## 2018-07-13 DIAGNOSIS — J309 Allergic rhinitis, unspecified: Secondary | ICD-10-CM

## 2018-07-13 DIAGNOSIS — E1149 Type 2 diabetes mellitus with other diabetic neurological complication: Secondary | ICD-10-CM

## 2018-07-13 DIAGNOSIS — I10 Essential (primary) hypertension: Secondary | ICD-10-CM | POA: Diagnosis not present

## 2018-07-13 DIAGNOSIS — I480 Paroxysmal atrial fibrillation: Secondary | ICD-10-CM

## 2018-07-13 NOTE — Progress Notes (Signed)
Location:    [facility] Room Number: 104/W Place of Service:  SNF [phone]) Provider:  Freddi Starr, MD  Patient Care Team: Virgie Dad, MD as PCP - General (Internal Medicine)  Extended Emergency Contact Information Primary Emergency Contact: Theresia Bough [place] of [place] Mobile Phone: [phone] Relation: Sister Secondary Emergency Contact: Nickie Retort [place] of [place] Mobile Phone: [phone] Relation: Aunt  Code Status:  Full Code Goals of care: Advanced Directive information Advanced Directives 07/13/2018  Does Patient Have a Medical Advance Directive? Yes  Type of Advance Directive (No Data)  Does patient want to make changes to medical advance directive? No - Patient declined  Would patient like information on creating a medical advance directive? No - Patient declined    Acute visit secondary to sore throat nasal drainage-   HPI:  Pt is a 57 y.o. male seen today for an acute visit for follow-up of a sore throat and apparently nasal drainage as well.  Patient does have a history of allergies and is thought this is most likely causing the symptoms he is not complaining of any shortness of breath chest congestion fever chills.  His other medical issues include type 2 diabetes as well as hypertension history of acute large right MCA CVA with left-sided hemiparalysis as well as a history of a decompressive right frontal temporoparietal craniotomy for decompression with duraplasty.  He does have a PEG tube but has requested that this be removed since he is taking now his medications and nourishment largely p.o.  Also has a diagnosis for hypercalcemia because of a mobility and sepsis due to a gluteal abscess that required surgical debridement back in February this has resolved.  Currently other than a sore throat and nasal drainage does not really have any complaints  Regards to type 2 diabetes his  blood sugars appear to run largely from the high 60s to low 100s he is on Glucophage 500 mg a day.  He also has a history of hypertension which appears to be stable he is on Lopressor 12.5 mg twice daily.  He also has a history of atrial fibrillation which again is controlled on Lopressor he is on Eliquis for anticoagulation.     Past Medical History:  Diagnosis Date  . DM (diabetes mellitus) ([place])   . Hypertension   . Stroke [facility])    Past Surgical History:  Procedure Laterality Date  . CRANIECTOMY Right 04/01/2017   Procedure: RIGHT DECOMPRESSIVE CRANIECTOMY;  Surgeon: Ditty, Kevan Ny, MD;  Location: [place];  Service: Neurosurgery;  Laterality: Right;  . ESOPHAGOGASTRODUODENOSCOPY N/A 04/13/2017   Procedure: ESOPHAGOGASTRODUODENOSCOPY (EGD);  Surgeon: Georganna Skeans, MD;  Location: [place];  Service: General;  Laterality: N/A;  bedside  . INCISION AND DRAINAGE PERIRECTAL ABSCESS N/A 09/28/2017   Procedure: IRRIGATION AND DEBRIDEMENT PERIANAL ABSCESS;  Surgeon: Virl Cagey, MD;  Location: [facility];  Service: General;  Laterality: N/A;  . PEG PLACEMENT N/A 04/13/2017   Procedure: PERCUTANEOUS ENDOSCOPIC GASTROSTOMY (PEG) PLACEMENT;  Surgeon: Georganna Skeans, MD;  Location: [facility] ENDOSCOPY;  Service: General;  Laterality: N/A;    Allergies  Allergen Reactions  . Cheese   . Penicillins Hives    Has patient had a PCN reaction causing immediate rash, facial/tongue/throat swelling, SOB or lightheadedness with hypotension: Unknown Has patient had a PCN reaction causing severe rash involving mucus membranes or skin necrosis: Unknown Has patient had a PCN reaction that required hospitalization: No Has patient had a  PCN reaction occurring within the last 10 years: No If all of the above answers are "NO", then may proceed with Cephalosporin use.     Outpatient Encounter Medications as of 07/13/2018  Medication Sig  . acetaminophen (TYLENOL) 325 MG tablet Place 650 mg into feeding  tube every 4 (four) hours as needed.  . Amino Acids-Protein Hydrolys (FEEDING SUPPLEMENT, PRO-STAT SUGAR FREE 64,) LIQD Place 30 mLs into feeding tube 2 (two) times daily between meals. Per gastric tube   . apixaban (ELIQUIS) 5 MG TABS tablet Place 1 tablet (5 mg total) into feeding tube 2 (two) times daily.  Marland Kitchen atorvastatin (LIPITOR) 10 MG tablet Take 5 mg by mouth daily.  . Baclofen 5 MG TABS Take 5 mg by mouth 2 (two) times daily.  . bisacodyl (DULCOLAX) 10 MG suppository Place 1 suppository (10 mg total) rectally daily as needed for moderate constipation.  . Cholecalciferol (VITAMIN D) 2000 units CAPS Take 1 capsule (2,000 Units total) by mouth daily.  . coal tar (NEUTROGENA T-GEL) 0.5 % shampoo Apply 1 application topically See admin instructions. Apply to scalp on shower days twice a week Tues., Fri.  . docusate (COLACE) 50 MG/5ML liquid Place 10 mLs (100 mg total) into feeding tube 2 (two) times daily as needed for mild constipation.  . gabapentin (NEURONTIN) 100 MG capsule 200 mg at bedtime. Feeding tube  . metFORMIN (GLUCOPHAGE) 500 MG tablet Take 500 mg by mouth daily.  . metoprolol tartrate (LOPRESSOR) 25 MG tablet Place 0.5 tablets (12.5 mg total) into feeding tube 2 (two) times daily.  Marland Kitchen omeprazole (PRILOSEC) 40 MG capsule Take 40 mg by mouth daily.   . polyethylene glycol (MIRALAX / GLYCOLAX) packet Place 17 g into feeding tube daily as needed.   No facility-administered encounter medications on file as of 07/13/2018.     Review of Systems   General is not complaining of any fever or chills.  Skin is not complain of rashes or itching.  Head ears eyes nose mouth and throat does complain of some nasal stuffiness runny nose possibly runny eyes and a sore throat does not complain of visual changes.  Respiratory is not complaining of shortness of breath or cough.  Cardiac does not complain of chest pain or significant edema.  GI is not complaining of abdominal pain nausea  vomiting diarrhea constipation.  GU is not complaining of dysuria.  Musculoskeletal does have left-sided hemiparesis status post CVA does not complain of joint pain.  Neurologic as noted above does not complain of headache or dizziness or syncope.  And psych is not complaining of being overtly depressed or anxious    There is no immunization history on file for this patient. Pertinent  Health Maintenance Due  Topic Date Due  . INFLUENZA VACCINE  07/18/2018 (Originally 03/08/2018)  . OPHTHALMOLOGY EXAM  08/13/2018 (Originally 04/06/1971)  . URINE MICROALBUMIN  08/13/2018 (Originally 04/06/1971)  . FOOT EXAM  08/24/2018  . HEMOGLOBIN A1C  11/28/2018  . COLONOSCOPY  Discontinued   Fall Risk  10/10/2017 09/13/2017 09/04/2017  Falls in the past year? No No No  Risk for fall due to : - Impaired balance/gait;Impaired mobility;Medication side effect;Mental status change -   Functional Status Survey:    Vitals:   07/13/18 1241  BP: 108/66  Pulse: 66  Resp: 16  Temp: (!) 97 F (36.1 C)  TempSrc: Oral  SpO2: 98%    Physical Exam In general this is a pleasant middle-age male in no distress lying comfortably in  bed.  His skin is warm and dry.  Oropharynx is clear mucous membranes moist.  Eyes appears to have possibly small amount of clear drainage visual acuity appears to be intact.  Nose again a small amount of clear drainage.  Throat is clear membranes moist I could not really appreciate increased erythema.  There is no exudate.  Chest clear to auscultation there is no labored breathing.  Heart is regular rate and rhythm without murmur gallop or rub has minimal lower extremity edema.  Abdomen is soft nontender with positive bowel sounds.  Musculoskeletal continues with left-sided hemiparalysis moves right upper and lower extremities it appears at baseline.  Neurologic as noted above he is alert does have some continued facial weakness.  Psych he is largely alert and  oriented pleasant appropriate.   Labs reviewed: Recent Labs    09/05/17 0709 09/24/17 1527  09/25/17 0545  01/19/18 1735 03/28/18 0700 05/29/18 0335  NA  --  140   < >  --    < > 143 140 141  K  --  4.2   < >  --    < > 4.6 4.1 4.0  CL  --  103   < >  --    < > 108 108 109  CO2  --  26   < >  --    < > 29 27 26   GLUCOSE  --  141*   < >  --    < > 88 86 92  BUN  --  24*   < >  --    < > 15 16 17   CREATININE  --  0.90   < >  --    < > 0.80 0.72 0.71  CALCIUM 11.5* 9.2   < >  --    < > 10.1 9.3 9.3  MG 2.1  --   --  2.1  --   --   --   --   PHOS 3.7 2.2*  --   --   --   --   --   --    < > = values in this interval not displayed.   Recent Labs    09/24/17 1527 10/10/17 0714 10/17/17 0710  AST 34 22 16  ALT 59 46 35  ALKPHOS 102 53 49  BILITOT 1.0 0.3 0.4  PROT 8.3* 7.6 6.8  ALBUMIN 2.6* 3.2* 3.1*   Recent Labs    03/28/18 0700 05/29/18 0335 06/12/18 0700  WBC 8.7 10.9* 8.2  NEUTROABS 4.5 5.4 4.1  HGB 14.4 12.8* 14.1  HCT 47.1 43.1 49.1  MCV 91.3 92.9 93.9  PLT 167 194 199   Lab Results  Component Value Date   TSH 1.524 02/06/2018   Lab Results  Component Value Date   HGBA1C 4.7 (L) 05/29/2018   Lab Results  Component Value Date   CHOL 165 05/29/2018   HDL 28 (L) 05/29/2018   LDLCALC 73 05/29/2018   TRIG 321 (H) 05/29/2018   CHOLHDL 5.9 05/29/2018    Significant Diagnostic Results in last 30 days:  No results found.  Assessment/Plan  #1 suspected allergy symptoms with sore throat he did respond previously to a course of Flonase will restart this 1 spray each nostril twice daily for 5 days- also he does have an order for Robitussin will make this routine every 6 hours for 72 hours and then as needed.  2.  Diabetes blood sugars appear to be well controlled I see  actually some readings in the 60s to 80s we will reduce the Glucophage down to 250 mg daily and monitor this continues to be stable suspect we might be able to take him off oral  medication.  3 atrial fibrillation appears rate controlled on Lopressor continues on Eliquis for anticoagulation.  4.  Hypertension this appears stable recent systolics to be more in the low 100s- continues on low-dose Lopressor he is also on this for anticoagulation.  Will update a CBC with differential and metabolic panel for updated values.  [id number]

## 2018-07-14 ENCOUNTER — Encounter (HOSPITAL_COMMUNITY)
Admission: AD | Admit: 2018-07-14 | Discharge: 2018-07-14 | Disposition: A | Discharge status: Home / Self Care | Payer: Medicaid Other | Source: Skilled Nursing Facility | Attending: Internal Medicine | Admitting: Internal Medicine

## 2018-07-14 DIAGNOSIS — I63311 Cerebral infarction due to thrombosis of right middle cerebral artery: Secondary | ICD-10-CM | POA: Diagnosis not present

## 2018-07-14 LAB — BASIC METABOLIC PANEL
Anion gap: 4 — ABNORMAL LOW (ref 5–15)
BUN: 10 mg/dL (ref 6–20)
CALCIUM: 9.2 mg/dL (ref 8.9–10.3)
CO2: 26 mmol/L (ref 22–32)
Chloride: 110 mmol/L (ref 98–111)
Creatinine, Ser: 0.86 mg/dL (ref 0.61–1.24)
GFR calc non Af Amer: >60 mL/min (ref >60)
Glucose, Bld: 75 mg/dL (ref 70–99)
POTASSIUM: 3.6 mmol/L (ref 3.5–5.1)
Sodium: 140 mmol/L (ref 135–145)

## 2018-07-14 LAB — CBC WITH DIFFERENTIAL/PLATELET
Abs Immature Granulocytes: 0.02 10*3/uL (ref 0.00–0.07)
Basophils Absolute: 0.1 10*3/uL (ref 0.0–0.1)
Basophils Relative: 1 %
EOS ABS: 0.3 10*3/uL (ref 0.0–0.5)
Eosinophils Relative: 3 %
HCT: 47.5 % (ref 39.0–52.0)
Hemoglobin: 13.8 g/dL (ref 13.0–17.0)
Immature Granulocytes: 0 %
LYMPHS ABS: 2.6 10*3/uL (ref 0.7–4.0)
Lymphocytes Relative: 30 %
MCH: 27 pg (ref 26.0–34.0)
MCHC: 29.1 g/dL — ABNORMAL LOW (ref 30.0–36.0)
MCV: 92.8 fL (ref 80.0–100.0)
Monocytes Absolute: 1.2 10*3/uL — ABNORMAL HIGH (ref 0.1–1.0)
Monocytes Relative: 13 %
NEUTROS ABS: 4.6 10*3/uL (ref 1.7–7.7)
Neutrophils Relative %: 53 %
Platelets: 156 10*3/uL (ref 150–400)
RBC: 5.12 MIL/uL (ref 4.22–5.81)
RDW: 14 % (ref 11.5–15.5)
WBC: 8.8 10*3/uL (ref 4.0–10.5)
nRBC: 0 % (ref 0.0–0.2)

## 2018-07-15 ENCOUNTER — Encounter: Payer: Self-pay | Admitting: Internal Medicine

## 2018-08-03 ENCOUNTER — Encounter: Payer: Self-pay | Admitting: Internal Medicine

## 2018-08-03 ENCOUNTER — Non-Acute Institutional Stay (SKILLED_NURSING_FACILITY): Payer: Medicaid Other | Admitting: Internal Medicine

## 2018-08-03 DIAGNOSIS — I10 Essential (primary) hypertension: Secondary | ICD-10-CM

## 2018-08-03 DIAGNOSIS — I69354 Hemiplegia and hemiparesis following cerebral infarction affecting left non-dominant side: Secondary | ICD-10-CM

## 2018-08-03 DIAGNOSIS — Z931 Gastrostomy status: Secondary | ICD-10-CM

## 2018-08-03 DIAGNOSIS — I480 Paroxysmal atrial fibrillation: Secondary | ICD-10-CM

## 2018-08-03 DIAGNOSIS — E1149 Type 2 diabetes mellitus with other diabetic neurological complication: Secondary | ICD-10-CM | POA: Diagnosis not present

## 2018-08-03 NOTE — Progress Notes (Signed)
Location:    [place] Room Number: 104/W Place of Service:  SNF [phone]) Provider:  Granville Lewis PA-C  Virgie Dad, MD  Patient Care Team: Virgie Dad, MD as PCP - General (Internal Medicine)  Extended Emergency Contact Information Primary Emergency Contact: Theresia Bough [place] Mobile Phone: [phone] Relation: Sister Secondary Emergency Contact: Nickie Retort [place] Mobile Phone: [phone] Relation: Aunt  Code Status:  Full Code Goals of care: Advanced Directive information Advanced Directives 08/03/2018  Does Patient Have a Medical Advance Directive? Yes  Type of Advance Directive (No Data)  Does patient want to make changes to medical advance directive? No - Patient declined  Would patient like information on creating a medical advance directive? No - Patient declined     Chief Complaint  Patient presents with  . 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 male seen today for medical management of chronic diseases.  As noted above he appears to be doing well.  He has had his PEG tube recently removed and appears to have tolerated this well he is now taking all his medications by mouth as well as all his p.o. intake.  He had largely been taking all his food p.o. previously occasionally would take his meds via PEG but again has tolerated having it removed and he really wanted it removed.  Most recent weight is 158.6 pounds this is down about 5 pounds from the last couple months although there may be  some scale variation here will have to be monitored however.  He is on Pro-stat supplementation twice a day  He also was seen for possible allergic rhinitis recently and responded well to a course of Flonase and routine Robitussin.  He does have a history of an acute large right MCA with left-sided hemiparesis continues to have significant left-sided weakness but does well with supportive care.  Does have a baclofen order for spasms. He is also on Eliquis-of note he is also on Eliquis with a history of atrial fibrillation continues on metoprolol for rate control.      He also has a history of a right frontal temporoparietal craniotomy with decompression and duraplasty- however the deformity on the right side of his head is gradually receding.  He does have a history of type 2 diabetes his blood sugars are largely in the 70s to the 106 range-he is on low-dose Glucophage--his hemoglobin A1c was 4.7 back in October   Does have a previous history of hypercalcemia thought secondary to immobility his calcium was stopped he did receive 1 dose of IV Reclast- this appears to be stable calcium on lab done earlier this month was 9.2.  Currently he is resting in bed comfortably really does not have any acute complaints other than feeling he may have some mild constipation said his last bowel movement was yesterday he is on as needed docusate as well as Dulcolax and MiraLAX as needed      Past Medical History:  Diagnosis Date  . DM (diabetes mellitus) ([place])   . Hypertension   . Stroke [facility])    Past Surgical History:  Procedure Laterality Date  . CRANIECTOMY Right 04/01/2017   Procedure: RIGHT DECOMPRESSIVE CRANIECTOMY;  Surgeon: Ditty, Kevan Ny, MD;  Location: [place];  Service: Neurosurgery;  Laterality: Right;  . ESOPHAGOGASTRODUODENOSCOPY N/A 04/13/2017   Procedure: ESOPHAGOGASTRODUODENOSCOPY (EGD);  Surgeon: Georganna Skeans, MD;  Location: [place];  Service: General;  Laterality:  N/A;  bedside  . INCISION AND DRAINAGE PERIRECTAL ABSCESS N/A 09/28/2017   Procedure: IRRIGATION AND DEBRIDEMENT PERIANAL ABSCESS;  Surgeon: Virl Cagey, MD;  [facility];  Service: General;  Laterality: N/A;  . PEG PLACEMENT N/A 04/13/2017   Procedure: PERCUTANEOUS ENDOSCOPIC GASTROSTOMY (PEG) PLACEMENT;  Surgeon: Georganna Skeans, MD;  Location: [facility] ENDOSCOPY;  Service: General;  Laterality: N/A;    Allergies  Allergen Reactions  . Cheese   . Penicillins Hives    Has patient had a PCN reaction causing immediate rash, facial/tongue/throat swelling, SOB or lightheadedness with hypotension: Unknown Has patient had a PCN reaction causing severe rash involving mucus membranes or skin necrosis: Unknown Has patient had a PCN reaction that required hospitalization: No Has patient had a PCN reaction occurring within the last 10 years: No If all of the above answers are "NO", then may proceed with Cephalosporin use.     Outpatient Encounter Medications as of 08/03/2018  Medication Sig  . acetaminophen (TYLENOL) 325 MG tablet Place 650 mg into feeding tube every 4 (four) hours as needed.  . Amino Acids-Protein Hydrolys (FEEDING SUPPLEMENT, PRO-STAT SUGAR FREE 64,) LIQD Place 30 mLs into feeding tube 2 (two) times daily between meals. Per gastric tube   . apixaban (ELIQUIS) 5 MG TABS tablet Place 1 tablet (5 mg total) into feeding tube 2 (two) times daily.  Marland Kitchen atorvastatin (LIPITOR) 10 MG tablet Take 5 mg by mouth daily.  . Baclofen 5 MG TABS Take 5 mg by mouth 2 (two) times daily.  . bisacodyl (DULCOLAX) 10 MG suppository Place 1 suppository (10 mg total) rectally daily as needed for moderate constipation.  . Cholecalciferol (VITAMIN D) 2000 units CAPS Take 1 capsule (2,000 Units total) by mouth daily.  . coal tar (NEUTROGENA T-GEL) 0.5 % shampoo Apply 1 application topically See admin instructions. Apply to scalp on shower days twice a week Tues., Fri.  . Dextromethorphan-guaiFENesin  (ROBITUSSIN COUGH+CHEST CONG DM PO) Take 10 mLs by mouth every 6 (six) hours as needed.  . docusate (COLACE) 50 MG/5ML liquid Place 10 mLs (100 mg total) into feeding tube 2 (two) times daily as needed for mild constipation.  . gabapentin (NEURONTIN) 100 MG capsule 200 mg at bedtime. Feeding tube  . metFORMIN (GLUCOPHAGE) 500 MG tablet Take 250 mg by mouth daily.   . metoprolol tartrate (LOPRESSOR) 25 MG tablet Place 0.5 tablets (12.5 mg total) into feeding tube 2 (two) times daily.  Marland Kitchen omeprazole (PRILOSEC) 40 MG capsule Take 40 mg by mouth daily.   . polyethylene glycol (MIRALAX / GLYCOLAX) packet Place 17 g into feeding tube daily as needed.   No facility-administered encounter medications on file as of 08/03/2018.      Review of Systems   General is not complaining of any fever or chills  Skin is not complaining of rashes or itching.  Head ears eyes nose mouth and throat does not complain of visual changes or sore throat.  Allergy symptoms appear to be resolved  Respiratory not complain of cough or shortness of breath  Cardiac is not complaining of chest pain or edema.  GI is not complaining of abdominal pain nausea vomiting diarrhea thinks  he has some mild constipation-- PEG tube has since been removed  GU is not complaining of dysuria.  Musculoskeletal does have left-sided hemiparesis not really complaining of spasticity discomfort at this time.  Neurologic as noted above  denis dizziness or syncope.  At times will complain of a headache but is not complaining today  Psych appears to be in good spirits does not complain of being depressed or anxious does continue to state he would like to go home  There is no immunization history on file for this patient. Pertinent  Health Maintenance Due  Topic Date Due  . INFLUENZA VACCINE  08/03/2018 (Originally 03/08/2018)  . OPHTHALMOLOGY EXAM  08/13/2018 (Originally 04/06/1971)  . URINE MICROALBUMIN  08/13/2018 (Originally 04/06/1971)    . FOOT EXAM  08/24/2018  . HEMOGLOBIN A1C  11/28/2018  . COLONOSCOPY  Discontinued   Fall Risk  10/10/2017 09/13/2017 09/04/2017  Falls in the past year? No No No  Risk for fall due to : - Impaired balance/gait;Impaired mobility;Medication side effect;Mental status change -   Functional Status Survey:    Vitals:   08/03/18 1243  BP: 106/62  Pulse: 63  Resp: 18  Temp: 97.6 F (36.4 C)  TempSrc: Oral  SpO2: 98%  Weight: 158 lb 9.6 oz (71.9 kg)  Height: 5\' 6"  (1.676 m)   Body mass index is 25.6 kg/m. Physical Exam In general this is a pleasant middle-age male in no distress lying comfortably in bed.  His skin is warm and dry he continues to have a protrusion area on the right side of his head but this appears to be gradually receding.  Eyes visual acuity appears to be grossly intact sclera and conjunctive are clear.  Oropharynx is clear mucous membranes moist.  Chest is clear to auscultation there is no labored breathing.  Heart is regular rate and rhythm without murmur gallop or rub he does not have significant lower extremity edema pedal pulses are intact bilaterally.  Abdomen is soft nontender with positive bowel sounds former PEG site appears to have well-healed scabbing without sign of infection.  Musculoskeletal continues with left-sided hemiparesis at baseline with stiffness.  Neurologic as noted above is able to move his right upper and lower extremities.  He is alert speech is clear.  Psych he is alert and oriented pleasant and appropriate Labs reviewed: Recent Labs    09/05/17 0709 09/24/17 1527  09/25/17 0545  03/28/18 0700 05/29/18 0335 07/14/18 0415  NA  --  140   < >  --    < > 140 141 140  K  --  4.2   < >  --    < > 4.1 4.0 3.6  CL  --  103   < >  --    < > 108 109 110  CO2  --  26   < >  --    < > 27 26 26   GLUCOSE  --  141*   < >  --    < > 86 92 75  BUN  --  24*   < >  --    < > 16 17 10   CREATININE  --  0.90   < >  --    < > 0.72 0.71 0.86   CALCIUM 11.5* 9.2   < >  --    < > 9.3 9.3 9.2  MG 2.1  --   --  2.1  --   --   --   --  PHOS 3.7 2.2*  --   --   --   --   --   --    < > = values in this interval not displayed.   Recent Labs    09/24/17 1527 10/10/17 0714 10/17/17 0710  AST 34 22 16  ALT 59 46 35  ALKPHOS 102 53 49  BILITOT 1.0 0.3 0.4  PROT 8.3* 7.6 6.8  ALBUMIN 2.6* 3.2* 3.1*   Recent Labs    05/29/18 0335 06/12/18 0700 07/14/18 0415  WBC 10.9* 8.2 8.8  NEUTROABS 5.4 4.1 4.6  HGB 12.8* 14.1 13.8  HCT 43.1 49.1 47.5  MCV 92.9 93.9 92.8  PLT 194 199 156   Lab Results  Component Value Date   TSH 1.524 02/06/2018   Lab Results  Component Value Date   HGBA1C 4.7 (L) 05/29/2018   Lab Results  Component Value Date   CHOL 165 05/29/2018   HDL 28 (L) 05/29/2018   LDLCALC 73 05/29/2018   TRIG 321 (H) 05/29/2018   CHOLHDL 5.9 05/29/2018    Significant Diagnostic Results in last 30 days:  No results found.  Assessment/Plan  History of CVA due to embolism of right middle cerebral artery status post craniotomy.  He is on Eliquis continues to need extensive support secondary to left-sided hemiparesis.  He is on a statin LDL was 73 in October 2019 he is on a statin.  Blood pressure appears to be stable- he is followed by neurology.  2.  History of atrial fibrillation this appears rate controlled on beta-blocker he is on Eliquis for anticoagulation.  3.  History of type 2 diabetes with neurologic complications Z4M was 4.7 in October- blood sugars as noted above are largely in the 70s to 90s occasionally slightly over 100- will DC low dose Glucophage and monitor.  4.-  History of neuropathy-spasticity-he is on baclofen as well as Neurontin this appears to help he also has Tylenol as needed.  5.  Hypertension appears stable on Metroprolol.  6.  History of PEG placement again this has been discontinued he is on a regular diet he has lost a small amount of weight he is on supplements at this  point will monitor there may be some scale variation here.  7-history of hypercalcemia again this is normalized calcium level was stable at 9.2 on lab done earlier this month-previously did receive a dose of IV Reclast has been followed by endocrinology. He is on  vitamin D  8.  History of left side extremity pain again this appears to be helped by the baclofen as well as Neurontin.  9.  Constipation he is on numerous PRN agents he will get a dose of MiraLAX tonight and monitor.    [id number]

## 2018-08-04 ENCOUNTER — Encounter: Payer: Self-pay | Admitting: Internal Medicine

## 2018-08-24 ENCOUNTER — Ambulatory Visit (HOSPITAL_COMMUNITY)
Admission: RE | Admit: 2018-08-24 | Discharge: 2018-08-24 | Disposition: A | Discharge status: Home / Self Care | Payer: Medicaid Other | Source: Ambulatory Visit | Attending: Neurosurgery | Admitting: Neurosurgery

## 2018-08-24 DIAGNOSIS — Z8673 Personal history of transient ischemic attack (TIA), and cerebral infarction without residual deficits: Secondary | ICD-10-CM | POA: Insufficient documentation

## 2018-09-15 ENCOUNTER — Other Ambulatory Visit (HOSPITAL_COMMUNITY)
Admission: RE | Admit: 2018-09-15 | Discharge: 2018-09-15 | Disposition: A | Discharge status: Home / Self Care | Payer: Medicaid Other | Source: Other Acute Inpatient Hospital | Attending: Internal Medicine | Admitting: Internal Medicine

## 2018-09-15 DIAGNOSIS — Z20828 Contact with and (suspected) exposure to other viral communicable diseases: Secondary | ICD-10-CM | POA: Insufficient documentation

## 2018-09-15 LAB — INFLUENZA PANEL BY PCR (TYPE A & B)
INFLAPCR: NEGATIVE
INFLBPCR: NEGATIVE

## 2018-09-27 ENCOUNTER — Encounter: Payer: Self-pay | Admitting: Internal Medicine

## 2018-09-27 ENCOUNTER — Non-Acute Institutional Stay (SKILLED_NURSING_FACILITY): Payer: Medicaid Other | Admitting: Internal Medicine

## 2018-09-27 DIAGNOSIS — Z9889 Other specified postprocedural states: Secondary | ICD-10-CM

## 2018-09-27 DIAGNOSIS — E108 Type 1 diabetes mellitus with unspecified complications: Secondary | ICD-10-CM | POA: Diagnosis not present

## 2018-09-27 DIAGNOSIS — I10 Essential (primary) hypertension: Secondary | ICD-10-CM | POA: Diagnosis not present

## 2018-09-27 DIAGNOSIS — I480 Paroxysmal atrial fibrillation: Secondary | ICD-10-CM | POA: Diagnosis not present

## 2018-09-27 DIAGNOSIS — I69354 Hemiplegia and hemiparesis following cerebral infarction affecting left non-dominant side: Secondary | ICD-10-CM

## 2018-09-27 NOTE — Progress Notes (Signed)
Location:  [place] Room Number: [place] of Service:  SNF [phone]) Provider:  Veleta Miners, MD  Virgie Dad, MD  Patient Care Team: Virgie Dad, MD as PCP - General (Internal Medicine)  Extended Emergency Contact Information Primary Emergency Contact: Theresia Bough [place] of [place] Phone: [phone] Relation: Sister Secondary Emergency Contact: Nickie Retort [place] of [place] Mobile Phone: [phone] Relation: Aunt  Code Status:  Full Code Goals of care: Advanced Directive information Advanced Directives 09/27/2018  Does Patient Have a Medical Advance Directive? No  Type of Advance Directive -  Does patient want to make changes to medical advance directive? No - Patient declined  Would patient like information on creating a medical advance directive? No - Patient declined     Chief Complaint  Patient presents with  . Medical Management of Chronic Issues    Hypertension, Vitamin D Deficiency, DM Type I, AF    HPI:  Pt is a 58 y.o. [place] seen today for medical management of chronic diseases.   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  Patient is now eating PO and PEG tube was removed. His weight has improved and is now 156 lbs.He is on Regular diet. He still is Wheelchair Dependent. Gets Restorative therapy . His Mood is good.  Had a CT scan done by neurosurgery recently to evaluate him for closure of his Craniectomy No Acute issues  Past Medical History:  Diagnosis Date  . DM (diabetes mellitus) ([place])   . Hypertension   . Stroke [facility])    Past Surgical History:  Procedure Laterality Date  . CRANIECTOMY Right 04/01/2017   Procedure:  RIGHT DECOMPRESSIVE CRANIECTOMY;  Surgeon: Ditty, Kevan Ny, MD;  Location: [place];  Service: Neurosurgery;  Laterality: Right;  . ESOPHAGOGASTRODUODENOSCOPY N/A 04/13/2017   Procedure: ESOPHAGOGASTRODUODENOSCOPY (EGD);  Surgeon: Georganna Skeans, MD;  Location: [place];  Service: General;  Laterality: N/A;  bedside  . INCISION AND DRAINAGE PERIRECTAL ABSCESS N/A 09/28/2017   Procedure: IRRIGATION AND DEBRIDEMENT PERIANAL ABSCESS;  Surgeon: Virl Cagey, MD;  Location: [facility];  Service: General;  Laterality: N/A;  . PEG PLACEMENT N/A 04/13/2017   Procedure: PERCUTANEOUS ENDOSCOPIC GASTROSTOMY (PEG) PLACEMENT;  Surgeon: Georganna Skeans, MD;  Location: [facility] ENDOSCOPY;  Service: General;  Laterality: N/A;    Allergies  Allergen Reactions  . Cheese   . Penicillins Hives    Has patient had a PCN reaction causing immediate rash, facial/tongue/throat swelling, SOB or lightheadedness with hypotension: Unknown Has patient had a PCN reaction causing severe rash involving mucus membranes or skin necrosis: Unknown Has patient had a PCN reaction that required hospitalization: No Has patient had a PCN reaction occurring within the last 10 years: No If all of the above answers are "NO", then may proceed with Cephalosporin use.     Outpatient Encounter Medications as of 09/27/2018  Medication Sig  . acetaminophen (TYLENOL) 325 MG tablet Place 650 mg into feeding tube every 4 (four) hours as needed.  . Amino Acids-Protein Hydrolys (FEEDING SUPPLEMENT, PRO-STAT SUGAR FREE 64,) LIQD Take 30 mLs by mouth 2 (two) times daily between meals.   Marland Kitchen apixaban (ELIQUIS) 5 MG TABS tablet Take 5 mg by mouth 2 (two) times daily.  Marland Kitchen atorvastatin (LIPITOR) 10 MG tablet Take 5 mg by mouth  daily.  . Baclofen 5 MG TABS Take 5 mg by mouth 2 (two) times daily.  . bisacodyl (DULCOLAX) 10 MG suppository Place 1 suppository (10 mg total) rectally daily as needed for moderate constipation.  . Cholecalciferol (VITAMIN D)  2000 units CAPS Take 1 capsule (2,000 Units total) by mouth daily.  . coal tar (NEUTROGENA T-GEL) 0.5 % shampoo Apply 1 application topically See admin instructions. Apply to scalp on shower days twice a week Tues., Fri.  . Dextromethorphan-guaiFENesin (ROBITUSSIN COUGH+CHEST CONG DM PO) Take 10 mLs by mouth every 6 (six) hours as needed.  . docusate (COLACE) 50 MG/5ML liquid Place 10 mLs (100 mg total) into feeding tube 2 (two) times daily as needed for mild constipation.  . gabapentin (NEURONTIN) 100 MG capsule Take 200 mg by mouth at bedtime.   . metoprolol succinate (TOPROL-XL) 25 MG 24 hr tablet Take 12.5 mg by mouth 2 (two) times daily.  . NON FORMULARY Diet type:  Upgrade to Regular Diet and continue thin liquids  . omeprazole (PRILOSEC) 40 MG capsule Take 40 mg by mouth daily.   . polyethylene glycol (MIRALAX / GLYCOLAX) packet Take 17 g by mouth daily as needed.  . [DISCONTINUED] apixaban (ELIQUIS) 5 MG TABS tablet Place 1 tablet (5 mg total) into feeding tube 2 (two) times daily. (Patient not taking: Reported on 09/27/2018)  . [DISCONTINUED] metFORMIN (GLUCOPHAGE) 500 MG tablet Take 250 mg by mouth daily.   . [DISCONTINUED] metoprolol tartrate (LOPRESSOR) 25 MG tablet Place 0.5 tablets (12.5 mg total) into feeding tube 2 (two) times daily. (Patient not taking: Reported on 09/27/2018)  . [DISCONTINUED] polyethylene glycol (MIRALAX / GLYCOLAX) packet Place 17 g into feeding tube daily as needed. (Patient not taking: Reported on 09/27/2018)   No facility-administered encounter medications on file as of 09/27/2018.     Review of Systems  Review of Systems  Constitutional: Negative for activity change, appetite change, chills, diaphoresis, fatigue and fever.  HENT: Negative for mouth sores, postnasal drip, rhinorrhea, sinus pain and sore throat.   Respiratory: Negative for apnea, cough, chest tightness, shortness of breath and wheezing.   Cardiovascular: Negative for chest pain, palpitations  and leg swelling.  Gastrointestinal: Negative for abdominal distention, abdominal pain, constipation, diarrhea, nausea and vomiting.  Genitourinary: Negative for dysuria and frequency.  Musculoskeletal: Negative for arthralgias, joint swelling and myalgias.  Skin: Negative for rash.  Neurological: Negative for dizziness, syncope, weakness, light-headedness and numbness.  Psychiatric/Behavioral: Negative for behavioral problems, confusion and sleep disturbance.     There is no immunization history for the selected administration types on file for this patient. Pertinent  Health Maintenance Due  Topic Date Due  . FOOT EXAM  10/26/2018 (Originally 08/24/2018)  . OPHTHALMOLOGY EXAM  10/26/2018 (Originally 04/06/1971)  . URINE MICROALBUMIN  10/26/2018 (Originally 04/06/1971)  . INFLUENZA VACCINE  11/06/2018 (Originally 03/08/2018)  . HEMOGLOBIN A1C  11/28/2018  . COLONOSCOPY  Discontinued   Fall Risk  10/10/2017 09/13/2017 09/04/2017  Falls in the past year? No No No  Risk for fall due to : - Impaired balance/gait;Impaired mobility;Medication side effect;Mental status change -   Functional Status Survey:    Vitals:   09/27/18 1229  BP: 111/75  Pulse: 69  Resp: 18  Temp: 98.3 F (36.8 C)  Weight: 156 lb 6.4 oz (70.9 kg)  Height: 5\' 6"  (1.676 m)   Body mass index is 25.24 kg/m. Physical Exam Constitutional:      Appearance: He is well-developed.  Eyes:  Pupils: Pupils are equal, round, and reactive to light.  Neck:     Musculoskeletal: Neck supple.  Cardiovascular:     Rate and Rhythm: Regular rhythm.  Pulmonary:     Effort: Pulmonary effort is normal. No respiratory distress.     Breath sounds: Normal breath sounds. No stridor. No wheezing.  Abdominal:     General: Bowel sounds are normal. There is no distension.     Palpations: Abdomen is soft.     Tenderness: There is no abdominal tenderness. There is no guarding.  Neurological:     Mental Status: He is alert.      Comments: Has Left Hemiparesis with some stiffness in his Extremities.     Labs reviewed: Recent Labs    03/28/18 0700 05/29/18 0335 07/14/18 0415  NA 140 141 140  K 4.1 4.0 3.6  CL 108 109 110  CO2 27 26 26   GLUCOSE 86 92 75  BUN 16 17 10   CREATININE 0.72 0.71 0.86  CALCIUM 9.3 9.3 9.2   Recent Labs    10/10/17 0714 10/17/17 0710  AST 22 16  ALT 46 35  ALKPHOS 53 49  BILITOT 0.3 0.4  PROT 7.6 6.8  ALBUMIN 3.2* 3.1*   Recent Labs    05/29/18 0335 06/12/18 0700 07/14/18 0415  WBC 10.9* 8.2 8.8  NEUTROABS 5.4 4.1 4.6  HGB 12.8* 14.1 13.8  HCT 43.1 49.1 47.5  MCV 92.9 93.9 92.8  PLT 194 199 156   Lab Results  Component Value Date   TSH 1.524 02/06/2018   Lab Results  Component Value Date   HGBA1C 4.7 (L) 05/29/2018   Lab Results  Component Value Date   CHOL 165 05/29/2018   HDL 28 (L) 05/29/2018   LDLCALC Joe 05/29/2018   TRIG 321 (H) 05/29/2018   CHOLHDL 5.9 05/29/2018    Significant Diagnostic Results in last 30 days:  No results found.  Assessment/Plan Diabetes mellitus with complications Last A1P FXT0.2IO 10/19 BS are running mostly less then 150 Levemir was Discontinued also Metformin was discontinued Accu checks now 3/week  AF (paroxysmal atrial fibrillation) In Sinus rhythm On Eliquis and Metoprolol  Cerebrovascular accident (CVA) due to embolism of right middle cerebral arteryS/P Craniectomy On Eliquis Continues to be full Nursing care LDL  Joe in 10/19 On Statin Will increase the Dose for goal of LDL less then 70 BP Controlled Follows withNeurology Also Has Follow up with Neurosurgery for Craniectomy.in April CT scan schedule in March.  Essential hypertension On Metoprolol  Status post craniectomy Follow up WithNeurosurgery Hypercalcemia Received one Dose of IV reclast Thought to be due to Immobility Follows with Dr Dorris Fetch. Calcium stopped . On Lower dose of Vit D Calcium stayingstable Gluteal  wound Completely resolved Left Extremities Pain Resolved now Were Most Likely due to Spasticity on Baclofen 5 Mg BID Already on Neurontin.    Family/ staff Communication:   Labs/tests ordered:  Repeat Lipid and A1C next visit  Total time spent in this patient care encounter was 25_ minutes; greater than 50% of the visit spent counseling patient, reviewing records , Labs and coordinating care for problems addressed at this encounter.

## 2018-10-29 ENCOUNTER — Encounter: Payer: Self-pay | Admitting: Adult Health

## 2018-10-29 ENCOUNTER — Non-Acute Institutional Stay (SKILLED_NURSING_FACILITY): Payer: Medicaid Other | Admitting: Adult Health

## 2018-10-29 DIAGNOSIS — L210 Seborrhea capitis: Secondary | ICD-10-CM

## 2018-10-29 DIAGNOSIS — I69354 Hemiplegia and hemiparesis following cerebral infarction affecting left non-dominant side: Secondary | ICD-10-CM

## 2018-10-29 DIAGNOSIS — I10 Essential (primary) hypertension: Secondary | ICD-10-CM | POA: Diagnosis not present

## 2018-10-29 DIAGNOSIS — E785 Hyperlipidemia, unspecified: Secondary | ICD-10-CM

## 2018-10-29 DIAGNOSIS — E1149 Type 2 diabetes mellitus with other diabetic neurological complication: Secondary | ICD-10-CM

## 2018-10-29 DIAGNOSIS — I480 Paroxysmal atrial fibrillation: Secondary | ICD-10-CM | POA: Diagnosis not present

## 2018-10-29 DIAGNOSIS — I63411 Cerebral infarction due to embolism of right middle cerebral artery: Secondary | ICD-10-CM

## 2018-10-29 DIAGNOSIS — E1142 Type 2 diabetes mellitus with diabetic polyneuropathy: Secondary | ICD-10-CM

## 2018-10-29 DIAGNOSIS — E1169 Type 2 diabetes mellitus with other specified complication: Secondary | ICD-10-CM

## 2018-10-29 DIAGNOSIS — K219 Gastro-esophageal reflux disease without esophagitis: Secondary | ICD-10-CM

## 2018-10-29 DIAGNOSIS — K5909 Other constipation: Secondary | ICD-10-CM

## 2018-10-29 NOTE — Progress Notes (Signed)
Location:   [place] Room Number: [place] of Service:  SNF (31)   CODE STATUS: Full Code  Allergies  Allergen Reactions  . Cheese   . Penicillins Hives    Has patient had a PCN reaction causing immediate rash, facial/tongue/throat swelling, SOB or lightheadedness with hypotension: Unknown Has patient had a PCN reaction causing severe rash involving mucus membranes or skin necrosis: Unknown Has patient had a PCN reaction that required hospitalization: No Has patient had a PCN reaction occurring within the last 10 years: No If all of the above answers are "NO", then may proceed with Cephalosporin use.     Chief Complaint  Patient presents with  . 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     HPI:  He is a 58 year old long term resident of this facility being seen for the management of his chronic illnesses: afib; hypertension; diabetes. He denies any uncontrolled pain; no changes in appetite; no anxiety or depressive thoughts; no reports of fevers present.   Past Medical History:  Diagnosis Date  . DM (diabetes mellitus) ([place])   . Hypertension   . Stroke [facility])     Past Surgical History:  Procedure Laterality Date  . CRANIECTOMY Right 04/01/2017   Procedure: RIGHT DECOMPRESSIVE CRANIECTOMY;  Surgeon: Ditty, Kevan Ny, MD;  Location: [place];  Service: Neurosurgery;  Laterality: Right;  . ESOPHAGOGASTRODUODENOSCOPY N/A 04/13/2017   Procedure: ESOPHAGOGASTRODUODENOSCOPY (EGD);  Surgeon: Georganna Skeans, MD;  Location: [place];  Service: General;  Laterality: N/A;  bedside  . INCISION AND DRAINAGE PERIRECTAL ABSCESS N/A 09/28/2017   Procedure: IRRIGATION AND DEBRIDEMENT PERIANAL ABSCESS;  Surgeon: Virl Cagey, MD;  Location: [facility];  Service: General;  Laterality: N/A;  . PEG PLACEMENT N/A 04/13/2017   Procedure:  PERCUTANEOUS ENDOSCOPIC GASTROSTOMY (PEG) PLACEMENT;  Surgeon: Georganna Skeans, MD;  Location: [facility];  Service: General;  Laterality: N/A;    Social History   Socioeconomic History  . Marital status: Unknown    Spouse name: Not on file  . Number of children: Not on file  . Years of education: Not on file  . Highest education level: Not on file  Occupational History  . Not on file  Social Needs  . Financial resource strain: Not on file  . Food insecurity:    Worry: Not on file    Inability: Not on file  . Transportation needs:    Medical: Not on file    Non-medical: Not on file  Tobacco Use  . Smoking status: Former Smoker    Types: Cigarettes  . Smokeless tobacco: Never Used  . Tobacco comment: UTA  Substance and Sexual Activity  . Alcohol use: No    Comment: UTA  . Drug use: No    Comment: UTA  . Sexual activity: Not Currently    Birth control/protection: None  Lifestyle  . Physical activity:    Days per week: Not on file    Minutes per session: Not on file  . Stress: Not on file  Relationships  . Social connections:    Talks on phone: Not on file    Gets together: Not on file    Attends religious service: Not on file    Active member of club or organization: Not on file    Attends meetings of clubs or organizations: Not on file    Relationship status: Not on  file  . Intimate partner violence:    Fear of current or ex partner: Not on file    Emotionally abused: Not on file    Physically abused: Not on file    Forced sexual activity: Not on file  Other Topics Concern  . Not on file  Social History Narrative  . Not on file   History reviewed. No pertinent family history.    VITAL SIGNS BP (!) 102/59   Pulse 75   Temp (!) 97.1 F (36.2 C)   Resp 18   Ht 5\' 6"  (1.676 m)   Wt 157 lb 12.8 oz (71.6 kg)   BMI 25.47 kg/m   Outpatient Encounter Medications as of 10/29/2018  Medication Sig  . acetaminophen (TYLENOL) 325 MG tablet Place 650 mg into  feeding tube every 4 (four) hours as needed.  . Amino Acids-Protein Hydrolys (FEEDING SUPPLEMENT, PRO-STAT SUGAR FREE 64,) LIQD Take 30 mLs by mouth 2 (two) times daily between meals.   Marland Kitchen apixaban (ELIQUIS) 5 MG TABS tablet Take 5 mg by mouth 2 (two) times daily.  Marland Kitchen atorvastatin (LIPITOR) 10 MG tablet Take 10 mg by mouth daily.   . Baclofen 5 MG TABS Take 5 mg by mouth 2 (two) times daily.  . bisacodyl (DULCOLAX) 10 MG suppository Place 1 suppository (10 mg total) rectally daily as needed for moderate constipation.  . Cholecalciferol (VITAMIN D) 2000 units CAPS Take 1 capsule (2,000 Units total) by mouth daily.  . coal tar (NEUTROGENA T-GEL) 0.5 % shampoo Apply 1 application topically See admin instructions. Apply to scalp on shower days twice a week Tues., Fri.  . Dextromethorphan-guaiFENesin (ROBITUSSIN COUGH+CHEST CONG DM PO) Take 10 mLs by mouth every 6 (six) hours as needed.  . docusate (COLACE) 50 MG/5ML liquid Place 10 mLs (100 mg total) into feeding tube 2 (two) times daily as needed for mild constipation.  . gabapentin (NEURONTIN) 100 MG capsule Take 200 mg by mouth at bedtime.   . metoprolol succinate (TOPROL-XL) 25 MG 24 hr tablet Take 12.5 mg by mouth 2 (two) times daily.  . NON FORMULARY Diet type:  Upgrade to Regular Diet and continue thin liquids  . omeprazole (PRILOSEC) 40 MG capsule Take 40 mg by mouth daily.   . polyethylene glycol (MIRALAX / GLYCOLAX) packet Take 17 g by mouth daily as needed.   No facility-administered encounter medications on file as of 10/29/2018.      SIGNIFICANT DIAGNOSTIC EXAMS  TODAY:   08-24-18: ct of head: 1. 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 Increased volume loss of the infarcted brain as well as decreased herniation of brain and dura through the craniectomy defect. 2. No new intracranial abnormality.  LABS REVIEWED TODAY:   05-29-18: chol 165; ldl 73; trig 321; hdl 28 hgb a1c 4.7  07-14-18: wbc 8.8;  hgb 13.8; hct 47.5; mcv 92.8 plt 156; glucose 75; bun 10; creat 0.86; k+ 3.6; na++ 140 ca 9.2    Review of Systems  Constitutional: Negative for malaise/fatigue.  Respiratory: Negative for cough and shortness of breath.   Cardiovascular: Negative for chest pain, palpitations and leg swelling.  Gastrointestinal: Negative for abdominal pain, constipation and heartburn.  Musculoskeletal: Positive for myalgias. Negative for back pain and joint pain.       Chronic left side pain is managed   Skin: Negative.   Neurological: Negative for dizziness.  Psychiatric/Behavioral: The patient is not nervous/anxious.     Physical Exam Constitutional:  General: He is not in acute distress.    Appearance: He is well-developed. He is not diaphoretic.  Neck:     Musculoskeletal: Neck supple.     Thyroid: No thyromegaly.  Cardiovascular:     Rate and Rhythm: Normal rate and regular rhythm.     Pulses: Normal pulses.     Heart sounds: Normal heart sounds.  Pulmonary:     Effort: Pulmonary effort is normal. No respiratory distress.     Breath sounds: Normal breath sounds.  Abdominal:     General: Bowel sounds are normal. There is no distension.     Palpations: Abdomen is soft.     Tenderness: There is no abdominal tenderness.  Musculoskeletal:     Right lower leg: No edema.     Left lower leg: No edema.     Comments: Left hemiplegia with stiffness present.   Lymphadenopathy:     Cervical: No cervical adenopathy.  Skin:    General: Skin is warm and dry.  Neurological:     Mental Status: He is alert. Mental status is at baseline.  Psychiatric:        Mood and Affect: Mood normal.        ASSESSMENT/ PLAN:  TODAY;   1. AF(paroxysmal atrial fibrillation): heart rate is stable; will continue lopressor 12.5 mg twice daily for rate control and eliquis 5 mg twice daily   2. Benign essential hypertension: is stable b/p 102/59 will continue lopressor 12.5 mg twice daily  3. Type 2  diabetes mellitus with neurological complications: is stable hgb a1c 4.7 will monitor  4. Hemiparesis affecting left side as late effect of cerebrovascular accident (CVA): is stable will continue baclofen 5 mg twice daily for spasticity  5. GERD without esophagitis: is stable will continue prilosec 40 mg daily   6. Dyslipidemia associated with type 2 diabetes mellitus is stable LDL 73 will continue lipitor 10 mg daily   7. Diabetic peripheral neuropathy: is stable will continue gabapentin 200 mg nightly   8. Chronic constipation: is stable will continue colace twice daily as needed and miralax daily as needed  9. Dandruff: stable will continue neutrogenia shampoo twice weekly   10. Cerebrovascular accident (CVA) due to embolism of right middle cerebral artery: S/P Decompressive right frontotemporoparietal craniectomy for decompression with duraplastyon 08/18. Is neurologically stable will continue eliquis 5 mg twice daily   Will check hgb a1c      MD is aware of resident's narcotic use and is in agreement with current plan of care. We will attempt to wean resident as apropriate   Ok Edwards NP [facility]  Contact [phone] Monday through Friday 8am- 5pm  After hours call [phone]

## 2018-10-30 ENCOUNTER — Encounter (HOSPITAL_COMMUNITY)
Admission: RE | Admit: 2018-10-30 | Discharge: 2018-10-30 | Disposition: A | Discharge status: Home / Self Care | Payer: Medicaid Other | Source: Skilled Nursing Facility | Attending: Adult Health | Admitting: Adult Health

## 2018-10-30 DIAGNOSIS — I63311 Cerebral infarction due to thrombosis of right middle cerebral artery: Secondary | ICD-10-CM | POA: Insufficient documentation

## 2018-10-30 DIAGNOSIS — E1165 Type 2 diabetes mellitus with hyperglycemia: Secondary | ICD-10-CM | POA: Diagnosis not present

## 2018-10-30 DIAGNOSIS — J1089 Influenza due to other identified influenza virus with other manifestations: Secondary | ICD-10-CM | POA: Insufficient documentation

## 2018-10-30 DIAGNOSIS — Z5189 Encounter for other specified aftercare: Secondary | ICD-10-CM | POA: Insufficient documentation

## 2018-10-30 LAB — HEMOGLOBIN A1C
HEMOGLOBIN A1C: 4.3 % — AB (ref 4.8–5.6)
Mean Plasma Glucose: 76.71 mg/dL

## 2018-11-03 DIAGNOSIS — L210 Seborrhea capitis: Secondary | ICD-10-CM | POA: Insufficient documentation

## 2018-11-03 DIAGNOSIS — E1142 Type 2 diabetes mellitus with diabetic polyneuropathy: Secondary | ICD-10-CM | POA: Insufficient documentation

## 2018-11-03 DIAGNOSIS — E1169 Type 2 diabetes mellitus with other specified complication: Secondary | ICD-10-CM | POA: Insufficient documentation

## 2018-11-03 DIAGNOSIS — K219 Gastro-esophageal reflux disease without esophagitis: Secondary | ICD-10-CM | POA: Insufficient documentation

## 2018-11-03 DIAGNOSIS — E785 Hyperlipidemia, unspecified: Secondary | ICD-10-CM

## 2018-11-03 DIAGNOSIS — K5909 Other constipation: Secondary | ICD-10-CM | POA: Insufficient documentation

## 2018-11-06 ENCOUNTER — Other Ambulatory Visit (HOSPITAL_COMMUNITY)
Admission: RE | Admit: 2018-11-06 | Discharge: 2018-11-06 | Disposition: A | Discharge status: Home / Self Care | Payer: Medicaid Other | Source: Skilled Nursing Facility | Attending: Internal Medicine | Admitting: Internal Medicine

## 2018-11-06 DIAGNOSIS — E1165 Type 2 diabetes mellitus with hyperglycemia: Secondary | ICD-10-CM | POA: Diagnosis not present

## 2018-11-07 ENCOUNTER — Other Ambulatory Visit (HOSPITAL_COMMUNITY)
Admission: RE | Admit: 2018-11-07 | Discharge: 2018-11-07 | Disposition: A | Discharge status: Home / Self Care | Payer: Medicaid Other | Source: Skilled Nursing Facility | Attending: Adult Health | Admitting: Adult Health

## 2018-11-07 DIAGNOSIS — E1165 Type 2 diabetes mellitus with hyperglycemia: Secondary | ICD-10-CM | POA: Diagnosis not present

## 2018-11-07 LAB — MICROALBUMIN / CREATININE URINE RATIO
Creatinine, Urine: 226.6 mg/dL
MICROALB/CREAT RATIO: 27 mg/g{creat} (ref 0–29)
Microalb, Ur: 61.6 ug/mL — ABNORMAL HIGH

## 2018-11-08 LAB — MICROALBUMIN, URINE: Microalb, Ur: 43 ug/mL — ABNORMAL HIGH

## 2018-11-27 ENCOUNTER — Encounter: Payer: Self-pay | Admitting: Adult Health

## 2018-11-27 ENCOUNTER — Non-Acute Institutional Stay (SKILLED_NURSING_FACILITY): Payer: Medicaid Other | Admitting: Adult Health

## 2018-11-27 DIAGNOSIS — K219 Gastro-esophageal reflux disease without esophagitis: Secondary | ICD-10-CM

## 2018-11-27 DIAGNOSIS — E785 Hyperlipidemia, unspecified: Secondary | ICD-10-CM | POA: Diagnosis not present

## 2018-11-27 DIAGNOSIS — E1169 Type 2 diabetes mellitus with other specified complication: Secondary | ICD-10-CM

## 2018-11-27 DIAGNOSIS — I69354 Hemiplegia and hemiparesis following cerebral infarction affecting left non-dominant side: Secondary | ICD-10-CM

## 2018-11-27 NOTE — Progress Notes (Signed)
Location:   [place] Room Number: [place] of Service:  SNF (31)   CODE STATUS: Full Code  Allergies  Allergen Reactions  . Cheese   . Penicillins Hives    Has patient had a PCN reaction causing immediate rash, facial/tongue/throat swelling, SOB or lightheadedness with hypotension: Unknown Has patient had a PCN reaction causing severe rash involving mucus membranes or skin necrosis: Unknown Has patient had a PCN reaction that required hospitalization: No Has patient had a PCN reaction occurring within the last 10 years: No If all of the above answers are "NO", then may proceed with Cephalosporin use.     Chief Complaint  Patient presents with  . Medical Management of Chronic Issues    Dyslipidemia associated with type 2 diabetes mellitus; hemiparesis affecting left side as late effect of cerebrovascular accident (CVA): gerd without esophagitis.     HPI:  He is a 58 year old long term resident of this facility being seen for the management of his chronic illnesses: dyslipidemia; hemiparesis; gerd. He denies any uncontrolled pain or spasticity. He denies any changes in appetite; no heart burn. No reports of fevers.   Past Medical History:  Diagnosis Date  . DM (diabetes mellitus) ([place])   . Hypertension   . Stroke [facility])     Past Surgical History:  Procedure Laterality Date  . CRANIECTOMY Right 04/01/2017   Procedure: RIGHT DECOMPRESSIVE CRANIECTOMY;  Surgeon: Ditty, Kevan Ny, MD;  Location: [place];  Service: Neurosurgery;  Laterality: Right;  . ESOPHAGOGASTRODUODENOSCOPY N/A 04/13/2017   Procedure: ESOPHAGOGASTRODUODENOSCOPY (EGD);  Surgeon: Georganna Skeans, MD;  Location: [place];  Service: General;  Laterality: N/A;  bedside  . INCISION AND DRAINAGE PERIRECTAL ABSCESS N/A 09/28/2017   Procedure: IRRIGATION AND DEBRIDEMENT PERIANAL ABSCESS;  Surgeon: Virl Cagey, MD;  Location: [facility];  Service: General;  Laterality: N/A;  .  PEG PLACEMENT N/A 04/13/2017   Procedure: PERCUTANEOUS ENDOSCOPIC GASTROSTOMY (PEG) PLACEMENT;  Surgeon: Georganna Skeans, MD;  Location: [facility] ENDOSCOPY;  Service: General;  Laterality: N/A;    Social History   Socioeconomic History  . Marital status: Unknown    Spouse name: Not on file  . Number of children: Not on file  . Years of education: Not on file  . Highest education level: Not on file  Occupational History  . Not on file  Social Needs  . Financial resource strain: Not on file  . Food insecurity:    Worry: Not on file    Inability: Not on file  . Transportation needs:    Medical: Not on file    Non-medical: Not on file  Tobacco Use  . Smoking status: Former Smoker    Types: Cigarettes  . Smokeless tobacco: Never Used  . Tobacco comment: UTA  Substance and Sexual Activity  . Alcohol use: No    Comment: UTA  . Drug use: No    Comment: UTA  . Sexual activity: Not Currently    Birth control/protection: None  Lifestyle  . Physical activity:    Days per week: Not on file    Minutes per session: Not on file  . Stress: Not on file  Relationships  . Social connections:    Talks on phone: Not on file    Gets together: Not on file    Attends religious service: Not on file    Active member of club or organization: Not on file    Attends meetings of clubs or organizations: Not  on file    Relationship status: Not on file  . Intimate partner violence:    Fear of current or ex partner: Not on file    Emotionally abused: Not on file    Physically abused: Not on file    Forced sexual activity: Not on file  Other Topics Concern  . Not on file  Social History Narrative  . Not on file   History reviewed. No pertinent family history.    VITAL SIGNS BP 119/63   Pulse 72   Temp 98.7 F (37.1 C)   Resp 17   Ht 5\' 6"  (1.676 m)   Wt 164 lb 12.8 oz (74.8 kg)   BMI 26.60 kg/m   Outpatient Encounter Medications as of 11/27/2018  Medication Sig  . acetaminophen (TYLENOL)  325 MG tablet Place 650 mg into feeding tube every 4 (four) hours as needed.  . Amino Acids-Protein Hydrolys (FEEDING SUPPLEMENT, PRO-STAT SUGAR FREE 64,) LIQD Take 30 mLs by mouth 2 (two) times daily between meals.   Marland Kitchen apixaban (ELIQUIS) 5 MG TABS tablet Take 5 mg by mouth 2 (two) times daily.  Marland Kitchen atorvastatin (LIPITOR) 10 MG tablet Take 10 mg by mouth daily.   . Baclofen 5 MG TABS Take 5 mg by mouth 2 (two) times daily.  . bisacodyl (DULCOLAX) 10 MG suppository Place 1 suppository (10 mg total) rectally daily as needed for moderate constipation.  . Cholecalciferol (VITAMIN D) 2000 units CAPS Take 1 capsule (2,000 Units total) by mouth daily.  . coal tar (NEUTROGENA T-GEL) 0.5 % shampoo Apply 1 application topically See admin instructions. Apply to scalp on shower days twice a week Tues., Fri.  . Dextromethorphan-guaiFENesin (ROBITUSSIN COUGH+CHEST CONG DM PO) Take 10 mLs by mouth every 6 (six) hours as needed.  . docusate (COLACE) 50 MG/5ML liquid Take 100 mg by mouth 2 (two) times daily as needed for mild constipation.  . gabapentin (NEURONTIN) 100 MG capsule Take 200 mg by mouth at bedtime.   . metoprolol succinate (TOPROL-XL) 25 MG 24 hr tablet Take 12.5 mg by mouth 2 (two) times daily.  . NON FORMULARY Diet type:  Upgrade to Regular Diet and continue thin liquids  . omeprazole (PRILOSEC) 40 MG capsule Take 40 mg by mouth daily.   . polyethylene glycol (MIRALAX / GLYCOLAX) packet Take 17 g by mouth daily as needed.  . [DISCONTINUED] docusate (COLACE) 50 MG/5ML liquid Place 10 mLs (100 mg total) into feeding tube 2 (two) times daily as needed for mild constipation. (Patient not taking: Reported on 11/27/2018)   No facility-administered encounter medications on file as of 11/27/2018.      SIGNIFICANT DIAGNOSTIC EXAMS  PREVIOUS:   08-24-18: ct of head: 1. 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 Increased volume loss of the infarcted brain as well as  decreased herniation of brain and dura through the craniectomy defect. 2. No new intracranial abnormality.  NO NEW EXAMS.   LABS REVIEWED PREVIOUS:   05-29-18: chol 165; ldl 73; trig 321; hdl 28 hgb a1c 4.7  07-14-18: wbc 8.8; hgb 13.8; hct 47.5; mcv 92.8 plt 156; glucose 75; bun 10; creat 0.86; k+ 3.6; na++ 140 ca 9.2   TODAY:   09-15-18: influenza: neg 10-30-18: hgb a1c 4.3 11-07-18: urine micro-albumin 43.0   Review of Systems  Constitutional: Negative for malaise/fatigue.  Respiratory: Negative for cough and shortness of breath.   Cardiovascular: Negative for chest pain, palpitations and leg swelling.  Gastrointestinal: Negative for abdominal pain,  constipation and heartburn.  Musculoskeletal: Negative for back pain, joint pain and myalgias.  Skin: Negative.   Neurological: Negative for dizziness.  Psychiatric/Behavioral: The patient is not nervous/anxious.     Physical Exam Constitutional:      General: He is not in acute distress.    Appearance: He is well-developed and normal weight. He is not diaphoretic.  Neck:     Musculoskeletal: Neck supple.     Thyroid: No thyromegaly.  Cardiovascular:     Rate and Rhythm: Normal rate and regular rhythm.     Pulses: Normal pulses.     Heart sounds: Normal heart sounds.  Pulmonary:     Effort: Pulmonary effort is normal. No respiratory distress.     Breath sounds: Normal breath sounds.  Abdominal:     General: Bowel sounds are normal. There is no distension.     Palpations: Abdomen is soft.     Tenderness: There is no abdominal tenderness.  Musculoskeletal:     Right lower leg: No edema.     Left lower leg: No edema.     Comments: Left hemiplegia with stiffness present.    Lymphadenopathy:     Cervical: No cervical adenopathy.  Skin:    General: Skin is warm and dry.  Neurological:     Mental Status: He is alert. Mental status is at baseline.  Psychiatric:        Mood and Affect: Mood normal.      ASSESSMENT/ PLAN:   TODAY;   1. Hemiparesis affecting self side as late effect of cerebrovascular assident (CVA): is stable will continue baclofen 5 mg twice daily for spasticity  2. GERD without esophagitis: is stable will continue prilosec 40 mg daily   3. Dyslipidemia associated with type 2 diabetes mellitus: is stable LDL 73; will continue lipitor 10 mg daily   PREVIOUS  4. AF(paroxysmal atrial fibrillation): heart rate is stable; will continue toprol xl  12.5 mg twice daily for rate control and eliquis 5 mg twice daily   5. Benign essential hypertension: is stable b/p 119/63 will continue toprol xl  12.5 mg twice daily  6. Type 2 diabetes mellitus with neurological complications: is stable hgb a1c 4.3 will monitor  7. Diabetic peripheral neuropathy: is stable will continue gabapentin 200 mg nightly   8. Chronic constipation: is stable will continue colace twice daily as needed and miralax daily as needed  9. Dandruff: stable will continue neutrogenia shampoo twice weekly   10. Cerebrovascular accident (CVA) due to embolism of right middle cerebral artery: S/P Decompressive right frontotemporoparietal craniectomy for decompression with duraplastyon 08/18. Is neurologically stable will continue eliquis 5 mg twice daily   Will check cmp lipids    MD is aware of resident's narcotic use and is in agreement with current plan of care. We will attempt to wean resident as apropriate   Ok Edwards NP [facility]  Contact [phone] Monday through Friday 8am- 5pm  After hours call [phone]

## 2018-11-28 ENCOUNTER — Encounter (HOSPITAL_COMMUNITY)
Admission: RE | Admit: 2018-11-28 | Discharge: 2018-11-28 | Disposition: A | Discharge status: Home / Self Care | Payer: Medicaid Other | Source: Skilled Nursing Facility | Attending: Internal Medicine | Admitting: Internal Medicine

## 2018-11-28 DIAGNOSIS — E785 Hyperlipidemia, unspecified: Secondary | ICD-10-CM | POA: Diagnosis not present

## 2018-11-28 LAB — COMPREHENSIVE METABOLIC PANEL
ALT: 16 U/L (ref 0–44)
AST: 9 U/L — ABNORMAL LOW (ref 15–41)
Albumin: 3.1 g/dL — ABNORMAL LOW (ref 3.5–5.0)
Alkaline Phosphatase: 57 U/L (ref 38–126)
Anion gap: 5 (ref 5–15)
BUN: 16 mg/dL (ref 6–20)
CO2: 27 mmol/L (ref 22–32)
Calcium: 9.1 mg/dL (ref 8.9–10.3)
Chloride: 108 mmol/L (ref 98–111)
Creatinine, Ser: 0.86 mg/dL (ref 0.61–1.24)
GFR calc Af Amer: >60 mL/min (ref >60)
GFR calc non Af Amer: >60 mL/min (ref >60)
Glucose, Bld: 93 mg/dL (ref 70–99)
Potassium: 4.4 mmol/L (ref 3.5–5.1)
Sodium: 140 mmol/L (ref 135–145)
Total Bilirubin: 0.3 mg/dL (ref 0.3–1.2)
Total Protein: 6.8 g/dL (ref 6.5–8.1)

## 2018-11-28 LAB — LIPID PANEL
Cholesterol: 146 mg/dL (ref 0–200)
HDL: 39 mg/dL — ABNORMAL LOW (ref >40)
LDL Cholesterol: 84 mg/dL (ref 0–99)
Total CHOL/HDL Ratio: 3.7 RATIO
Triglycerides: 114 mg/dL (ref <150)
VLDL: 23 mg/dL (ref 0–40)

## 2018-12-11 ENCOUNTER — Telehealth: Payer: Self-pay | Admitting: *Deleted

## 2018-12-11 NOTE — Telephone Encounter (Signed)
[facility].  Spoke to [employer].  They do not do VV.  She stated he was doing fine.  Will r/s appt to later time 03-08-19 at 0900.  May have to adjust if precautions still in place.  She verbalized understanding.

## 2018-12-12 ENCOUNTER — Ambulatory Visit: Payer: Medicaid Other | Admitting: Adult Health

## 2018-12-17 ENCOUNTER — Encounter: Payer: Self-pay | Admitting: Adult Health

## 2018-12-17 ENCOUNTER — Non-Acute Institutional Stay (SKILLED_NURSING_FACILITY): Payer: Medicaid Other | Admitting: Adult Health

## 2018-12-17 DIAGNOSIS — K5909 Other constipation: Secondary | ICD-10-CM

## 2018-12-17 NOTE — Progress Notes (Signed)
Location:   [place] Room Number: [place] of Service:  SNF (31)   CODE STATUS: Full Code  Allergies  Allergen Reactions  . Cheese   . Penicillins Hives    Has patient had a PCN reaction causing immediate rash, facial/tongue/throat swelling, SOB or lightheadedness with hypotension: Unknown Has patient had a PCN reaction causing severe rash involving mucus membranes or skin necrosis: Unknown Has patient had a PCN reaction that required hospitalization: No Has patient had a PCN reaction occurring within the last 10 years: No If all of the above answers are "NO", then may proceed with Cephalosporin use.     Chief Complaint  Patient presents with  . Acute Visit    Syncope    HPI:  He had a syncopal episode over this past weekend. He denies any chest pain shortness of breath or vertigo. There are no reports of fevers present. The nurse did do a digital exam and found him to have significant constipation. He was given a fleets enema with relief noted.   Past Medical History:  Diagnosis Date  . DM (diabetes mellitus) ([place])   . Hypertension   . Stroke [facility])     Past Surgical History:  Procedure Laterality Date  . CRANIECTOMY Right 04/01/2017   Procedure: RIGHT DECOMPRESSIVE CRANIECTOMY;  Surgeon: Ditty, Kevan Ny, MD;  Location: [place];  Service: Neurosurgery;  Laterality: Right;  . ESOPHAGOGASTRODUODENOSCOPY N/A 04/13/2017   Procedure: ESOPHAGOGASTRODUODENOSCOPY (EGD);  Surgeon: Georganna Skeans, MD;  Location: [place];  Service: General;  Laterality: N/A;  bedside  . INCISION AND DRAINAGE PERIRECTAL ABSCESS N/A 09/28/2017   Procedure: IRRIGATION AND DEBRIDEMENT PERIANAL ABSCESS;  Surgeon: Virl Cagey, MD;  Location: [facility];  Service: General;  Laterality: N/A;  . PEG PLACEMENT N/A 04/13/2017   Procedure: PERCUTANEOUS ENDOSCOPIC GASTROSTOMY (PEG) PLACEMENT;  Surgeon: Georganna Skeans, MD;  Location[facility] ENDOSCOPY;  Service: General;   Laterality: N/A;    Social History   Socioeconomic History  . Marital status: Unknown    Spouse name: Not on file  . Number of children: Not on file  . Years of education: Not on file  . Highest education level: Not on file  Occupational History  . Not on file  Social Needs  . Financial resource strain: Not on file  . Food insecurity:    Worry: Not on file    Inability: Not on file  . Transportation needs:    Medical: Not on file    Non-medical: Not on file  Tobacco Use  . Smoking status: Former Smoker    Types: Cigarettes  . Smokeless tobacco: Never Used  . Tobacco comment: UTA  Substance and Sexual Activity  . Alcohol use: No    Comment: UTA  . Drug use: No    Comment: UTA  . Sexual activity: Not Currently    Birth control/protection: None  Lifestyle  . Physical activity:    Days per week: Not on file    Minutes per session: Not on file  . Stress: Not on file  Relationships  . Social connections:    Talks on phone: Not on file    Gets together: Not on file    Attends religious service: Not on file    Active member of club or organization: Not on file    Attends meetings of clubs or organizations: Not on file    Relationship status: Not on file  . Intimate partner violence:    Fear  of current or ex partner: Not on file    Emotionally abused: Not on file    Physically abused: Not on file    Forced sexual activity: Not on file  Other Topics Concern  . Not on file  Social History Narrative  . Not on file   History reviewed. No pertinent family history.    VITAL SIGNS Ht 5\' 6"  (1.676 m)   Wt 166 lb (75.3 kg)   BMI 26.79 kg/m   Outpatient Encounter Medications as of 12/17/2018  Medication Sig  . acetaminophen (TYLENOL) 325 MG tablet Place 650 mg into feeding tube every 4 (four) hours as needed.  . Amino Acids-Protein Hydrolys (FEEDING SUPPLEMENT, PRO-STAT SUGAR FREE 64,) LIQD Take 30 mLs by mouth 2 (two) times daily between meals.   Marland Kitchen apixaban  (ELIQUIS) 5 MG TABS tablet Take 5 mg by mouth 2 (two) times daily.  Marland Kitchen atorvastatin (LIPITOR) 20 MG tablet Take 20 mg by mouth daily.   . Baclofen 5 MG TABS Take 5 mg by mouth 2 (two) times daily.  . bisacodyl (DULCOLAX) 10 MG suppository Place 1 suppository (10 mg total) rectally daily as needed for moderate constipation.  . Cholecalciferol (VITAMIN D) 2000 units CAPS Take 1 capsule (2,000 Units total) by mouth daily.  . coal tar (NEUTROGENA T-GEL) 0.5 % shampoo Apply 1 application topically See admin instructions. Apply to scalp on shower days twice a week Tues., Fri.  . Dextromethorphan-guaiFENesin (ROBITUSSIN COUGH+CHEST CONG DM PO) Take 10 mLs by mouth every 6 (six) hours as needed.  . docusate (COLACE) 50 MG/5ML liquid Take 100 mg by mouth 2 (two) times daily as needed for mild constipation.  . gabapentin (NEURONTIN) 100 MG capsule Take 200 mg by mouth at bedtime.   Marland Kitchen ketoconazole (NIZORAL) 2 % cream Apply topically three times daily to ringworm area to left hip until resolved  . metoprolol succinate (TOPROL-XL) 25 MG 24 hr tablet Take 12.5 mg by mouth 2 (two) times daily.  . NON FORMULARY Diet type:  Upgrade to Regular Diet and continue thin liquids  . omeprazole (PRILOSEC) 40 MG capsule Take 40 mg by mouth daily.   . polyethylene glycol (MIRALAX / GLYCOLAX) packet Take 17 g by mouth daily as needed.   No facility-administered encounter medications on file as of 12/17/2018.      SIGNIFICANT DIAGNOSTIC EXAMS  PREVIOUS:   08-24-18: ct of head: 1. 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 Increased volume loss of the infarcted brain as well as decreased herniation of brain and dura through the craniectomy defect. 2. No new intracranial abnormality.  NO NEW EXAMS.   LABS REVIEWED PREVIOUS:   05-29-18: chol 165; ldl 73; trig 321; hdl 28 hgb a1c 4.7  07-14-18: wbc 8.8; hgb 13.8; hct 47.5; mcv 92.8 plt 156; glucose 75; bun 10; creat 0.86; k+ 3.6; na++ 140  ca 9.2  09-15-18: influenza: neg 10-30-18: hgb a1c 4.3 11-07-18: urine micro-albumin 43.0   NO NEW LABS.   Review of Systems  Constitutional: Negative for malaise/fatigue.  Respiratory: Negative for cough and shortness of breath.   Cardiovascular: Negative for chest pain, palpitations and leg swelling.  Gastrointestinal: Positive for constipation. Negative for abdominal pain and heartburn.  Musculoskeletal: Negative for back pain, joint pain and myalgias.  Skin: Negative.   Neurological: Negative for dizziness.  Psychiatric/Behavioral: The patient is not nervous/anxious.      Physical Exam Constitutional:      General: He is not in acute distress.  Appearance: He is well-developed. He is not diaphoretic.  Neck:     Musculoskeletal: Neck supple.     Thyroid: No thyromegaly.  Cardiovascular:     Rate and Rhythm: Normal rate and regular rhythm.     Pulses: Normal pulses.     Heart sounds: Normal heart sounds.  Pulmonary:     Effort: Pulmonary effort is normal. No respiratory distress.     Breath sounds: Normal breath sounds.  Abdominal:     General: Bowel sounds are normal. There is no distension.     Palpations: Abdomen is soft.     Tenderness: There is no abdominal tenderness.  Musculoskeletal:     Right lower leg: No edema.     Left lower leg: No edema.     Comments:  Left hemiplegia with stiffness present.   Lymphadenopathy:     Cervical: No cervical adenopathy.  Skin:    General: Skin is warm and dry.  Neurological:     Mental Status: He is alert. Mental status is at baseline.  Psychiatric:        Mood and Affect: Mood normal.       ASSESSMENT/ PLAN:  TODAY;   1. Chronic constipation: is worse; will begin miralax daily routinely and will monitor his status.     MD is aware of resident's narcotic use and is in agreement with current plan of care. We will attempt to wean resident as apropriate   Ok Edwards NP [facility]  Contact  [phone] Monday through Friday 8am- 5pm  After hours call [phone]

## 2018-12-27 ENCOUNTER — Encounter: Payer: Self-pay | Admitting: Internal Medicine

## 2018-12-27 ENCOUNTER — Non-Acute Institutional Stay (SKILLED_NURSING_FACILITY): Payer: Medicaid Other | Admitting: Internal Medicine

## 2018-12-27 DIAGNOSIS — Z9889 Other specified postprocedural states: Secondary | ICD-10-CM

## 2018-12-27 DIAGNOSIS — I63411 Cerebral infarction due to embolism of right middle cerebral artery: Secondary | ICD-10-CM | POA: Diagnosis not present

## 2018-12-27 DIAGNOSIS — I480 Paroxysmal atrial fibrillation: Secondary | ICD-10-CM

## 2018-12-27 DIAGNOSIS — E1149 Type 2 diabetes mellitus with other diabetic neurological complication: Secondary | ICD-10-CM

## 2018-12-27 NOTE — Progress Notes (Signed)
Location:    [place] Room Number: 104/W Place of Service:  SNF [phone]) Provider: Veleta Miners MD  Virgie Dad, MD  Patient Care Team: Virgie Dad, MD as PCP - General (Internal Medicine)  Extended Emergency Contact Information Primary Emergency Contact: Theresia Bough [place] Phone: [phone] Relation: Sister Secondary Emergency Contact: Nickie Retort [place] of [place] Mobile Phone: [phone] Relation: Aunt  Code Status:  Full Code Goals of care: Advanced Directive information Advanced Directives 12/27/2018  Does Patient Have a Medical Advance Directive? Yes  Type of Advance Directive [place]  Does patient want to make changes to medical advance directive? No - Patient declined  Copy of [place] in Chart? No - copy requested  Would patient like information on creating a medical advance directive? No - Patient declined     Chief Complaint  Patient presents with  . Medical Management of Chronic Issues    Routine visit of medical management   . Best Practice Recommendations    PPSV-23, T-Dap  . Quality Metric Gaps    Eye Exam    HPI:  Pt is a 58 y.o. male seen today for medical management of chronic diseases.    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 He stays stable Eating well. Has gained weight. No New Nursing issues. Patient say she hurts in his paresis side and was wondering if he can get something stronger then Tylenol He also wants to know when he can go home. He says his sister can take him. Right now he is complete dependent for his ADLS and is wheelchair dependent.      Past Medical History:  Diagnosis Date  . DM (diabetes mellitus) ([place])   . Hypertension   . Stroke [facility])    Past Surgical History:  Procedure Laterality Date  . CRANIECTOMY Right 04/01/2017   Procedure: RIGHT DECOMPRESSIVE CRANIECTOMY;  Surgeon: Ditty, Kevan Ny, MD;  Location: [place];  Service: Neurosurgery;  Laterality: Right;  . ESOPHAGOGASTRODUODENOSCOPY N/A 04/13/2017   Procedure: ESOPHAGOGASTRODUODENOSCOPY (EGD);  Surgeon: Georganna Skeans, MD;  Location: [place];  Service: General;  Laterality: N/A;  bedside  . INCISION AND DRAINAGE PERIRECTAL ABSCESS N/A 09/28/2017   Procedure: IRRIGATION AND DEBRIDEMENT PERIANAL ABSCESS;  Surgeon: Virl Cagey, MD;  Location: [facility];  Service: General;  Laterality: N/A;  . PEG PLACEMENT N/A 04/13/2017   Procedure: PERCUTANEOUS ENDOSCOPIC GASTROSTOMY (PEG) PLACEMENT;  Surgeon: Georganna Skeans, MD;  Location: [facility] ENDOSCOPY;  Service: General;  Laterality: N/A;    Allergies  Allergen Reactions  . Cheese   . Penicillins Hives    Has patient had a PCN reaction causing immediate rash, facial/tongue/throat swelling, SOB or lightheadedness with hypotension: Unknown Has patient had a PCN reaction causing severe rash involving mucus membranes or skin necrosis: Unknown Has patient had a PCN reaction that required hospitalization: No Has patient had a PCN reaction occurring within the last 10 years: No If all of the above answers are "NO", then may proceed with Cephalosporin use.     Outpatient Encounter Medications as of 12/27/2018  Medication Sig  . acetaminophen (TYLENOL) 325 MG tablet Place 650 mg into feeding tube every 4 (four) hours as needed.  . Amino Acids-Protein Hydrolys (FEEDING SUPPLEMENT, PRO-STAT SUGAR FREE 64,)  LIQD Take 30 mLs by mouth 2 (two) times daily between meals.   Marland Kitchen apixaban (ELIQUIS) 5 MG TABS tablet Take 5 mg by mouth 2 (two) times daily.  Marland Kitchen atorvastatin (LIPITOR) 20 MG tablet Take 20 mg by mouth daily.   . Baclofen 5  MG TABS Take 5 mg by mouth 2 (two) times daily.  . bisacodyl (DULCOLAX) 10 MG suppository Place 1 suppository (10 mg total) rectally daily as needed for moderate constipation.  . Cholecalciferol (VITAMIN D) 2000 units CAPS Take 1 capsule (2,000 Units total) by mouth daily.  . coal tar (NEUTROGENA T-GEL) 0.5 % shampoo Apply 1 application topically See admin instructions. Apply to scalp on shower days twice a week Tues., Fri.  . Dextromethorphan-guaiFENesin (ROBITUSSIN COUGH+CHEST CONG DM PO) Take 10 mLs by mouth every 6 (six) hours as needed.  . gabapentin (NEURONTIN) 100 MG capsule Take 200 mg by mouth at bedtime.   Marland Kitchen ketoconazole (NIZORAL) 2 % cream Apply topically three times daily to ringworm area to left hip until resolved  . metoprolol succinate (TOPROL-XL) 25 MG 24 hr tablet Take 12.5 mg by mouth 2 (two) times daily.  . NON FORMULARY Diet type:  Upgrade to Regular Diet and continue thin liquids  . omeprazole (PRILOSEC) 40 MG capsule Take 40 mg by mouth daily.   . polyethylene glycol (MIRALAX / GLYCOLAX) packet Take 17 g by mouth daily as needed.  . [DISCONTINUED] docusate (COLACE) 50 MG/5ML liquid Take 100 mg by mouth 2 (two) times daily as needed for mild constipation.   No facility-administered encounter medications on file as of 12/27/2018.      Review of Systems  Review of Systems  Constitutional: Negative for activity change, appetite change, chills, diaphoresis, fatigue and fever.  HENT: Negative for mouth sores, postnasal drip, rhinorrhea, sinus pain and sore throat.   Respiratory: Negative for apnea, cough, chest tightness, shortness of breath and wheezing.   Cardiovascular: Negative for chest pain, palpitations and leg swelling.  Gastrointestinal: Negative for abdominal distention, abdominal pain, constipation, diarrhea, nausea and vomiting.  Genitourinary: Negative for dysuria and frequency.  Musculoskeletal: Positive  for arthralgias,and myalgias.  Skin: Negative for rash.   Neurological: Negative for dizziness, syncope, weakness, light-headedness and numbness.  Psychiatric/Behavioral: Negative for behavioral problems, confusion and sleep disturbance.     There is no immunization history for the selected administration types on file for this patient. Pertinent  Health Maintenance Due  Topic Date Due  . OPHTHALMOLOGY EXAM  01/17/2019 (Originally 04/06/1971)  . INFLUENZA VACCINE  03/09/2019  . HEMOGLOBIN A1C  05/02/2019  . FOOT EXAM  06/05/2019  . URINE MICROALBUMIN  11/07/2019  . COLONOSCOPY  Discontinued   Fall Risk  10/10/2017 09/13/2017 09/04/2017  Falls in the past year? No No No  Risk for fall due to : - Impaired balance/gait;Impaired mobility;Medication side effect;Mental status change -   Functional Status Survey:    Vitals:   12/27/18 0818  BP: 92/64  Pulse: 73  Resp: 16  Temp: 98.1 F (36.7 C)  TempSrc: Oral  SpO2: 95%  Weight: 166 lb (75.3 kg)  Height: 5\' 6"  (1.676 m)   Body mass index is 26.79 kg/m. Physical Exam  Labs reviewed: Recent Labs    05/29/18 0335 07/14/18 0415 11/28/18 0657  NA 141 140 140  K 4.0 3.6 4.4  CL 109 110 108  CO2 26 26 27   GLUCOSE 92 75 93  BUN 17 10 16   CREATININE 0.71 0.86 0.86  CALCIUM 9.3 9.2 9.1  Recent Labs    11/28/18 0657  AST 9*  ALT 16  ALKPHOS 57  BILITOT 0.3  PROT 6.8  ALBUMIN 3.1*   Recent Labs    05/29/18 0335 06/12/18 0700 07/14/18 0415  WBC 10.9* 8.2 8.8  NEUTROABS 5.4 4.1 4.6  HGB 12.8* 14.1 13.8  HCT 43.1 49.1 47.5  MCV 92.9 93.9 92.8  PLT 194 199 156   Lab Results  Component Value Date   TSH 1.524 02/06/2018   Lab Results  Component Value Date   HGBA1C 4.3 (L) 10/30/2018   Lab Results  Component Value Date   CHOL 146 11/28/2018   HDL 39 (L) 11/28/2018   LDLCALC 84 11/28/2018   TRIG 114 11/28/2018   CHOLHDL 3.7 11/28/2018    Significant Diagnostic Results in last 30 days:  No results found.  Assessment/Plan  Diabetes mellitus with complications  Last M4Q AST4.1DQ22/29 BS are running mostly less then 150 Accu checks now 3/week  AF (paroxysmal atrial fibrillation) In Sinus rhythm On Eliquisand Metoprolol  Cerebrovascular accident (CVA) due to embolism of right middle cerebral arteryS/P Craniectomy On Eliquis Continues to be full Nursing care LDL84in 04/20 On Statin  BP Controlled Follows withNeurology Also Has Follow up with Neurosurgery for Craniectomy.which was delayed due to Covid Status post craniectomy Follow up WithNeurosurgery Hypercalcemia Received one Dose of IV reclast Thought to be due to [place] with Dr Dorris Fetch. Calcium stopped . On Lower dose of Vit D Calcium stayingstable Left Extremities Pain  Were Most Likely due to Spasticity on Baclofen 5 Mg BID Already on Neurontin.   Family/ staff Communication:   Labs/tests ordered:  Will order for Pneumonia and Tetanus Shots   Total time spent in this patient care encounter was  25_  minutes; greater than 50% of the visit spent counseling patient and staff, reviewing records , Labs and coordinating care for problems addressed at this encounter.

## 2019-01-22 ENCOUNTER — Encounter: Payer: Self-pay | Admitting: Adult Health

## 2019-01-22 ENCOUNTER — Non-Acute Institutional Stay (SKILLED_NURSING_FACILITY): Payer: Medicaid Other | Admitting: Adult Health

## 2019-01-22 DIAGNOSIS — I10 Essential (primary) hypertension: Secondary | ICD-10-CM

## 2019-01-22 DIAGNOSIS — I480 Paroxysmal atrial fibrillation: Secondary | ICD-10-CM | POA: Diagnosis not present

## 2019-01-22 DIAGNOSIS — E1159 Type 2 diabetes mellitus with other circulatory complications: Secondary | ICD-10-CM | POA: Diagnosis not present

## 2019-01-22 DIAGNOSIS — E1149 Type 2 diabetes mellitus with other diabetic neurological complication: Secondary | ICD-10-CM | POA: Diagnosis not present

## 2019-01-22 DIAGNOSIS — I152 Hypertension secondary to endocrine disorders: Secondary | ICD-10-CM

## 2019-01-22 NOTE — Progress Notes (Signed)
Location:   [place] Room Number: [place] of Service:  SNF (31)   CODE STATUS: Full Code  Allergies  Allergen Reactions  . Cheese   . Penicillins Hives    Has patient had a PCN reaction causing immediate rash, facial/tongue/throat swelling, SOB or lightheadedness with hypotension: Unknown Has patient had a PCN reaction causing severe rash involving mucus membranes or skin necrosis: Unknown Has patient had a PCN reaction that required hospitalization: No Has patient had a PCN reaction occurring within the last 10 years: No If all of the above answers are "NO", then may proceed with Cephalosporin use.     Chief Complaint  Patient presents with  . Medical Management of Chronic Issues      AF (paroxysmal atrial fibrillation)  Hypertension associated with type 2 diabetes mellitus:  Type 2 diabetes mellitus with neurological complications:    HPI:  He is a 58 year old long term resident of this facility being seen for the management of his chronic illnesses: afib; hypertension; diabetes. He denies any uncontrolled pain; no changes in his appetite; no insomnia no depressive thoughts. There are no reports of fevers present.   Past Medical History:  Diagnosis Date  . DM (diabetes mellitus) ([place])   . Hypertension   . Stroke [facility])     Past Surgical History:  Procedure Laterality Date  . CRANIECTOMY Right 04/01/2017   Procedure: RIGHT DECOMPRESSIVE CRANIECTOMY;  Surgeon: Ditty, Kevan Ny, MD;  Location: [place];  Service: Neurosurgery;  Laterality: Right;  . ESOPHAGOGASTRODUODENOSCOPY N/A 04/13/2017   Procedure: ESOPHAGOGASTRODUODENOSCOPY (EGD);  Surgeon: Georganna Skeans, MD;  Location: [place];  Service: General;  Laterality: N/A;  bedside  . INCISION AND DRAINAGE PERIRECTAL ABSCESS N/A 09/28/2017   Procedure: IRRIGATION AND DEBRIDEMENT PERIANAL ABSCESS;  Surgeon: Virl Cagey, MD;  Location: [facility];  Service: General;  Laterality: N/A;   . PEG PLACEMENT N/A 04/13/2017   Procedure: PERCUTANEOUS ENDOSCOPIC GASTROSTOMY (PEG) PLACEMENT;  Surgeon: Georganna Skeans, MD;  Location: [facility] ENDOSCOPY;  Service: General;  Laterality: N/A;    Social History   Socioeconomic History  . Marital status: Unknown    Spouse name: Not on file  . Number of children: Not on file  . Years of education: Not on file  . Highest education level: Not on file  Occupational History  . Not on file  Social Needs  . Financial resource strain: Not on file  . Food insecurity    Worry: Not on file    Inability: Not on file  . Transportation needs    Medical: Not on file    Non-medical: Not on file  Tobacco Use  . Smoking status: Former Smoker    Types: Cigarettes  . Smokeless tobacco: Never Used  . Tobacco comment: UTA  Substance and Sexual Activity  . Alcohol use: No    Comment: UTA  . Drug use: No    Comment: UTA  . Sexual activity: Not Currently    Birth control/protection: None  Lifestyle  . Physical activity    Days per week: Not on file    Minutes per session: Not on file  . Stress: Not on file  Relationships  . Social Herbalist on phone: Not on file    Gets together: Not on file    Attends religious service: Not on file    Active member of club or organization: Not on file    Attends meetings of clubs  or organizations: Not on file    Relationship status: Not on file  . Intimate partner violence    Fear of current or ex partner: Not on file    Emotionally abused: Not on file    Physically abused: Not on file    Forced sexual activity: Not on file  Other Topics Concern  . Not on file  Social History Narrative  . Not on file   History reviewed. No pertinent family history.    VITAL SIGNS BP 100/61   Pulse 61   Temp 97.7 F (36.5 C)   Resp 16   Ht 5\' 6"  (1.676 m)   Wt 169 lb 3.2 oz (76.7 kg)   BMI 27.31 kg/m   Outpatient Encounter Medications as of 01/22/2019  Medication Sig  . acetaminophen (TYLENOL)  325 MG tablet Place 650 mg into feeding tube every 4 (four) hours as needed.  . Amino Acids-Protein Hydrolys (FEEDING SUPPLEMENT, PRO-STAT SUGAR FREE 64,) LIQD Take 30 mLs by mouth 2 (two) times daily between meals.   Marland Kitchen apixaban (ELIQUIS) 5 MG TABS tablet Take 5 mg by mouth 2 (two) times daily.  Marland Kitchen atorvastatin (LIPITOR) 20 MG tablet Take 20 mg by mouth daily.   . Baclofen 5 MG TABS Take 5 mg by mouth 2 (two) times daily.  . bisacodyl (DULCOLAX) 10 MG suppository Place 1 suppository (10 mg total) rectally daily as needed for moderate constipation.  . Cholecalciferol (VITAMIN D) 2000 units CAPS Take 1 capsule (2,000 Units total) by mouth daily.  . coal tar (NEUTROGENA T-GEL) 0.5 % shampoo Apply 1 application topically See admin instructions. Apply to scalp on shower days twice a week Tues., Fri.  . Dextromethorphan-guaiFENesin (ROBITUSSIN COUGH+CHEST CONG DM PO) Take 10 mLs by mouth every 6 (six) hours as needed.  . gabapentin (NEURONTIN) 100 MG capsule Take 200 mg by mouth at bedtime.   Marland Kitchen ketoconazole (NIZORAL) 2 % cream Apply topically three times daily to ringworm area to left hip until resolved  . metoprolol succinate (TOPROL-XL) 25 MG 24 hr tablet Take 12.5 mg by mouth 2 (two) times daily.  . NON FORMULARY Diet type:  Upgrade to Regular Diet and continue thin liquids  . omeprazole (PRILOSEC) 40 MG capsule Take 40 mg by mouth daily.   . polyethylene glycol (MIRALAX / GLYCOLAX) packet Take 17 g by mouth daily.    No facility-administered encounter medications on file as of 01/22/2019.      SIGNIFICANT DIAGNOSTIC EXAMS  PREVIOUS:   08-24-18: ct of head: 1. 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 Increased volume loss of the infarcted brain as well as decreased herniation of brain and dura through the craniectomy defect. 2. No new intracranial abnormality.  NO NEW EXAMS.   LABS REVIEWED PREVIOUS:   05-29-18: chol 165; ldl 73; trig 321; hdl 28 hgb a1c  4.7  07-14-18: wbc 8.8; hgb 13.8; hct 47.5; mcv 92.8 plt 156; glucose 75; bun 10; creat 0.86; k+ 3.6; na++ 140 ca 9.2  09-15-18: influenza: neg 10-30-18: hgb a1c 4.3 11-07-18: urine micro-albumin 43.0   TODAY:   11-28-18: glucose 93; bun 16; creat 0.86; k+ 4.4; an++ 140; ca 9.0; liver normal albumin 3.1; chol 146; ldl 84; trig 114; hdl 39   Review of Systems  Constitutional: Negative for malaise/fatigue.  Respiratory: Negative for cough and shortness of breath.   Cardiovascular: Negative for chest pain, palpitations and leg swelling.  Gastrointestinal: Negative for abdominal pain, constipation and heartburn.  Musculoskeletal:  Negative for back pain, joint pain and myalgias.  Skin: Negative.   Neurological: Negative for dizziness.  Psychiatric/Behavioral: The patient is not nervous/anxious.        Physical Exam Constitutional:      General: He is not in acute distress.    Appearance: He is well-developed. He is not diaphoretic.  Neck:     Musculoskeletal: Neck supple.     Thyroid: No thyromegaly.  Cardiovascular:     Rate and Rhythm: Normal rate and regular rhythm.     Pulses: Normal pulses.     Heart sounds: Normal heart sounds.  Pulmonary:     Effort: Pulmonary effort is normal. No respiratory distress.     Breath sounds: Normal breath sounds.  Abdominal:     General: Bowel sounds are normal. There is no distension.     Palpations: Abdomen is soft.     Tenderness: There is no abdominal tenderness.  Musculoskeletal:     Right lower leg: No edema.     Left lower leg: No edema.     Comments: Left hemiplegia with stiffness present.   Lymphadenopathy:     Cervical: No cervical adenopathy.  Skin:    General: Skin is warm and dry.  Neurological:     Mental Status: He is alert. Mental status is at baseline.     Comments: History of right decompression craniotomy   Psychiatric:        Mood and Affect: Mood normal.       ASSESSMENT/ PLAN:  TODAY;   1. AF (paroxysmal  atrial fibrillation) heart rate is stable; will continue toprol xl 12.5 mg twice daily for rate control is on eliquis 5 mg twice daily   2. Hypertension associated with type 2 diabetes mellitus: is stable b/p 100/61 will continue toprol xl 12.5 mg twice daily   3. Type 2 diabetes mellitus with neurological complications: is stable hgb a1c 4.3 will monitor    PREVIOUS  4. Diabetic peripheral neuropathy: is stable will continue gabapentin 200 mg nightly   5. Chronic constipation: is stable will continue colace twice daily as needed and miralax daily as needed  6. Dandruff: stable will continue neutrogenia shampoo twice weekly   7. Cerebrovascular accident (CVA) due to embolism of right middle cerebral artery: S/P Decompressive right frontotemporoparietal craniectomy for decompression with duraplastyon 08/18. Is neurologically stable will continue eliquis 5 mg twice daily   8. Hemiparesis affecting self side as late effect of cerebrovascular assident (CVA): is stable will continue baclofen 5 mg twice daily for spasticity  9. GERD without esophagitis: is stable will continue prilosec 40 mg daily   10. Dyslipidemia associated with type 2 diabetes mellitus: is stable LDL 84; will continue lipitor 10 mg daily      MD is aware of resident's narcotic use and is in agreement with current plan of care. We will attempt to wean resident as apropriate   Ok Edwards NP [facility]  Contact [phone] Monday through Friday 8am- 5pm  After hours call [phone]

## 2019-01-24 DIAGNOSIS — E1159 Type 2 diabetes mellitus with other circulatory complications: Secondary | ICD-10-CM | POA: Insufficient documentation

## 2019-02-13 ENCOUNTER — Ambulatory Visit: Payer: Medicaid Other | Admitting: "Endocrinology

## 2019-02-21 ENCOUNTER — Encounter: Payer: Self-pay | Admitting: Adult Health

## 2019-02-21 ENCOUNTER — Non-Acute Institutional Stay (SKILLED_NURSING_FACILITY): Payer: Medicaid Other | Admitting: Adult Health

## 2019-02-21 DIAGNOSIS — E1142 Type 2 diabetes mellitus with diabetic polyneuropathy: Secondary | ICD-10-CM

## 2019-02-21 DIAGNOSIS — K5909 Other constipation: Secondary | ICD-10-CM

## 2019-02-21 DIAGNOSIS — I63411 Cerebral infarction due to embolism of right middle cerebral artery: Secondary | ICD-10-CM | POA: Diagnosis not present

## 2019-02-21 NOTE — Progress Notes (Signed)
Location:   [place] Room Number: [place] of Service:  SNF (31)   CODE STATUS: Full Code  Allergies  Allergen Reactions  . Cheese   . Penicillins Hives    Has patient had a PCN reaction causing immediate rash, facial/tongue/throat swelling, SOB or lightheadedness with hypotension: Unknown Has patient had a PCN reaction causing severe rash involving mucus membranes or skin necrosis: Unknown Has patient had a PCN reaction that required hospitalization: No Has patient had a PCN reaction occurring within the last 10 years: No If all of the above answers are "NO", then may proceed with Cephalosporin use.     Chief Complaint  Patient presents with  . Medical Management of Chronic Issues        Diabetic peripheral neuropathy: Chronic constipation:Cerebrovascular accident (CVA) due to embolism of right middle cerebral artery    HPI:  He is a 58 year old long term resident of this facility being seen for the management of his chronic illnesses: neuropathy; constipation; cva. He denies any uncontrolled pain; no changes in his appetite. He denies any insomnia or anxiety.   Past Medical History:  Diagnosis Date  . DM (diabetes mellitus) ([place])   . Hypertension   . Stroke [facility])     Past Surgical History:  Procedure Laterality Date  . CRANIECTOMY Right 04/01/2017   Procedure: RIGHT DECOMPRESSIVE CRANIECTOMY;  Surgeon: Ditty, Kevan Ny, MD;  Location: [place];  Service: Neurosurgery;  Laterality: Right;  . ESOPHAGOGASTRODUODENOSCOPY N/A 04/13/2017   Procedure: ESOPHAGOGASTRODUODENOSCOPY (EGD);  Surgeon: Georganna Skeans, MD;  Location: [place];  Service: General;  Laterality: N/A;  bedside  . INCISION AND DRAINAGE PERIRECTAL ABSCESS N/A 09/28/2017   Procedure: IRRIGATION AND DEBRIDEMENT PERIANAL ABSCESS;  Surgeon: Virl Cagey, MD;  Location: [facility];  Service: General;  Laterality: N/A;  . PEG PLACEMENT N/A 04/13/2017   Procedure: PERCUTANEOUS  ENDOSCOPIC GASTROSTOMY (PEG) PLACEMENT;  Surgeon: Georganna Skeans, MD;  Location: [facility] ENDOSCOPY;  Service: General;  Laterality: N/A;    Social History   Socioeconomic History  . Marital status: Unknown    Spouse name: Not on file  . Number of children: Not on file  . Years of education: Not on file  . Highest education level: Not on file  Occupational History  . Not on file  Social Needs  . Financial resource strain: Not on file  . Food insecurity    Worry: Not on file    Inability: Not on file  . Transportation needs    Medical: Not on file    Non-medical: Not on file  Tobacco Use  . Smoking status: Former Smoker    Types: Cigarettes  . Smokeless tobacco: Never Used  . Tobacco comment: UTA  Substance and Sexual Activity  . Alcohol use: No    Comment: UTA  . Drug use: No    Comment: UTA  . Sexual activity: Not Currently    Birth control/protection: None  Lifestyle  . Physical activity    Days per week: Not on file    Minutes per session: Not on file  . Stress: Not on file  Relationships  . Social Herbalist on phone: Not on file    Gets together: Not on file    Attends religious service: Not on file    Active member of club or organization: Not on file    Attends meetings of clubs or organizations: Not on file    Relationship status:  Not on file  . Intimate partner violence    Fear of current or ex partner: Not on file    Emotionally abused: Not on file    Physically abused: Not on file    Forced sexual activity: Not on file  Other Topics Concern  . Not on file  Social History Narrative  . Not on file   History reviewed. No pertinent family history.    VITAL SIGNS BP 102/70   Pulse 80   Temp 98.3 F (36.8 C)   Ht 5\' 6"  (1.676 m)   Wt 171 lb 6.4 oz (77.7 kg)   BMI 27.66 kg/m   Outpatient Encounter Medications as of 02/21/2019  Medication Sig  . acetaminophen (TYLENOL) 325 MG tablet Take 650 mg by mouth every 4 (four) hours as needed.    . Amino Acids-Protein Hydrolys (FEEDING SUPPLEMENT, PRO-STAT SUGAR FREE 64,) LIQD Take 30 mLs by mouth 2 (two) times daily between meals.   Marland Kitchen apixaban (ELIQUIS) 5 MG TABS tablet Take 5 mg by mouth 2 (two) times daily.  Marland Kitchen atorvastatin (LIPITOR) 20 MG tablet Take 20 mg by mouth daily.   . Baclofen 5 MG TABS Take 5 mg by mouth 2 (two) times daily.  . bisacodyl (DULCOLAX) 10 MG suppository Place 1 suppository (10 mg total) rectally daily as needed for moderate constipation.  . Cholecalciferol (VITAMIN D) 2000 units CAPS Take 1 capsule (2,000 Units total) by mouth daily.  . coal tar (NEUTROGENA T-GEL) 0.5 % shampoo Apply 1 application topically See admin instructions. Apply to scalp on shower days twice a week Tues., Fri.  . Dextromethorphan-guaiFENesin (ROBITUSSIN COUGH+CHEST CONG DM PO) Take 10 mLs by mouth every 6 (six) hours as needed.  . gabapentin (NEURONTIN) 100 MG capsule Take 200 mg by mouth at bedtime.   Marland Kitchen ketoconazole (NIZORAL) 2 % cream Apply topically three times daily to ringworm area to left hip until resolved  . metoprolol succinate (TOPROL-XL) 25 MG 24 hr tablet Take 12.5 mg by mouth 2 (two) times daily.  . NON FORMULARY Diet type:  Upgrade to Regular Diet and continue thin liquids  . omeprazole (PRILOSEC) 40 MG capsule Take 40 mg by mouth daily.   . polyethylene glycol (MIRALAX / GLYCOLAX) packet Take 17 g by mouth daily.    No facility-administered encounter medications on file as of 02/21/2019.      SIGNIFICANT DIAGNOSTIC EXAMS  PREVIOUS:   08-24-18: ct of head: 1. 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 Increased volume loss of the infarcted brain as well as decreased herniation of brain and dura through the craniectomy defect. 2. No new intracranial abnormality.  NO NEW EXAMS.   LABS REVIEWED PREVIOUS:   05-29-18: chol 165; ldl 73; trig 321; hdl 28 hgb a1c 4.7  07-14-18: wbc 8.8; hgb 13.8; hct 47.5; mcv 92.8 plt 156; glucose 75; bun  10; creat 0.86; k+ 3.6; na++ 140 ca 9.2  09-15-18: influenza: neg 10-30-18: hgb a1c 4.3 11-07-18: urine micro-albumin 43.0  11-28-18: glucose 93; bun 16; creat 0.86; k+ 4.4; an++ 140; ca 9.0; liver normal albumin 3.1; chol 146; ldl 84; trig 114; hdl 39   NO NEW LABS.    Review of Systems  Constitutional: Negative for malaise/fatigue.  Respiratory: Negative for cough and shortness of breath.   Cardiovascular: Negative for chest pain, palpitations and leg swelling.  Gastrointestinal: Negative for abdominal pain, constipation and heartburn.  Musculoskeletal: Negative for back pain, joint pain and myalgias.  Skin: Negative.  Neurological: Negative for dizziness.  Psychiatric/Behavioral: The patient is not nervous/anxious.      Physical Exam Constitutional:      General: He is not in acute distress.    Appearance: He is well-developed. He is not diaphoretic.  Neck:     Musculoskeletal: Neck supple.     Thyroid: No thyromegaly.  Cardiovascular:     Rate and Rhythm: Normal rate and regular rhythm.     Pulses: Normal pulses.     Heart sounds: Normal heart sounds.  Pulmonary:     Effort: Pulmonary effort is normal. No respiratory distress.     Breath sounds: Normal breath sounds.  Abdominal:     General: Bowel sounds are normal. There is no distension.     Palpations: Abdomen is soft.     Tenderness: There is no abdominal tenderness.  Musculoskeletal:     Right lower leg: No edema.     Left lower leg: No edema.     Comments: Left hemiplegia with stiffness present  Lymphadenopathy:     Cervical: No cervical adenopathy.  Skin:    General: Skin is warm and dry.  Neurological:     Mental Status: He is alert. Mental status is at baseline.     Comments: History of right decompression craniotomy    Psychiatric:        Mood and Affect: Mood normal.     ASSESSMENT/ PLAN:  TODAY;   1. Diabetic peripheral neuropathy: is stable will continue neurontin 200 mg nightly   2. Chronic  constipation: is stable will continue colace twice daily as needed and miralax daily as needed  3. Cerebrovascular accident (CVA) due to embolism of right middle cerebral artery: S/P decompressive right frontotemporoparietal craniectomy for decompression with duraplasty on 08/18 is neurologically stable will continue elquis 5 mg twice daily     PREVIOUS  4. Dandruff: stable will continue neutrogenia shampoo twice weekly   5. Hemiparesis affecting self side as late effect of cerebrovascular assident (CVA): is stable will continue baclofen 5 mg twice daily for spasticity  6. GERD without esophagitis: is stable will continue prilosec 40 mg daily   7. Dyslipidemia associated with type 2 diabetes mellitus: is stable LDL 84; will continue lipitor 10 mg daily   8. AF (paroxysmal atrial fibrillation) heart rate is stable; will continue toprol xl 12.5 mg twice daily for rate control is on eliquis 5 mg twice daily   9. Hypertension associated with type 2 diabetes mellitus: is stable b/p 102/70 will continue toprol xl 12.5 mg twice daily   10. Type 2 diabetes mellitus with neurological complications: is stable hgb a1c 4.3 will monitor     MD is aware of resident's narcotic use and is in agreement with current plan of care. We will attempt to wean resident as apropriate   Ok Edwards NP [facility]  Contact [phone] Monday through Friday 8am- 5pm  After hours call [phone]

## 2019-03-08 ENCOUNTER — Encounter (HOSPITAL_COMMUNITY)
Admission: RE | Admit: 2019-03-08 | Discharge: 2019-03-08 | Disposition: A | Discharge status: Home / Self Care | Payer: Medicaid Other | Source: Ambulatory Visit | Attending: Internal Medicine | Admitting: Internal Medicine

## 2019-03-08 ENCOUNTER — Ambulatory Visit: Payer: Self-pay | Admitting: Adult Health

## 2019-03-08 DIAGNOSIS — I10 Essential (primary) hypertension: Secondary | ICD-10-CM | POA: Diagnosis present

## 2019-03-08 LAB — CBC WITH DIFFERENTIAL/PLATELET
Abs Immature Granulocytes: 0.02 10*3/uL (ref 0.00–0.07)
Basophils Absolute: 0.1 10*3/uL (ref 0.0–0.1)
Basophils Relative: 1 %
Eosinophils Absolute: 0.5 10*3/uL (ref 0.0–0.5)
Eosinophils Relative: 5 %
HCT: 41.6 % (ref 39.0–52.0)
Hemoglobin: 12 g/dL — ABNORMAL LOW (ref 13.0–17.0)
Immature Granulocytes: 0 %
Lymphocytes Relative: 35 %
Lymphs Abs: 3.4 10*3/uL (ref 0.7–4.0)
MCH: 25.4 pg — ABNORMAL LOW (ref 26.0–34.0)
MCHC: 28.8 g/dL — ABNORMAL LOW (ref 30.0–36.0)
MCV: 88.1 fL (ref 80.0–100.0)
Monocytes Absolute: 0.9 10*3/uL (ref 0.1–1.0)
Monocytes Relative: 9 %
Neutro Abs: 4.8 10*3/uL (ref 1.7–7.7)
Neutrophils Relative %: 50 %
Platelets: 193 10*3/uL (ref 150–400)
RBC: 4.72 MIL/uL (ref 4.22–5.81)
RDW: 14.6 % (ref 11.5–15.5)
WBC: 9.7 10*3/uL (ref 4.0–10.5)
nRBC: 0 % (ref 0.0–0.2)

## 2019-03-08 LAB — COMPREHENSIVE METABOLIC PANEL
ALT: 17 U/L (ref 0–44)
AST: 10 U/L — ABNORMAL LOW (ref 15–41)
Albumin: 3.1 g/dL — ABNORMAL LOW (ref 3.5–5.0)
Alkaline Phosphatase: 64 U/L (ref 38–126)
Anion gap: 6 (ref 5–15)
BUN: 15 mg/dL (ref 6–20)
CO2: 23 mmol/L (ref 22–32)
Calcium: 9.1 mg/dL (ref 8.9–10.3)
Chloride: 109 mmol/L (ref 98–111)
Creatinine, Ser: 0.79 mg/dL (ref 0.61–1.24)
GFR calc Af Amer: >60 mL/min (ref >60)
GFR calc non Af Amer: >60 mL/min (ref >60)
Glucose, Bld: 97 mg/dL (ref 70–99)
Potassium: 4.2 mmol/L (ref 3.5–5.1)
Sodium: 138 mmol/L (ref 135–145)
Total Bilirubin: 0.4 mg/dL (ref 0.3–1.2)
Total Protein: 6.6 g/dL (ref 6.5–8.1)

## 2019-03-08 LAB — TSH: TSH: 1.925 u[IU]/mL (ref 0.350–4.500)

## 2019-03-09 LAB — VITAMIN D 25 HYDROXY (VIT D DEFICIENCY, FRACTURES): Vit D, 25-Hydroxy: 22.8 ng/mL — ABNORMAL LOW (ref 30.0–100.0)

## 2019-03-12 ENCOUNTER — Encounter: Payer: Self-pay | Admitting: "Endocrinology

## 2019-03-12 ENCOUNTER — Ambulatory Visit (INDEPENDENT_AMBULATORY_CARE_PROVIDER_SITE_OTHER): Payer: Medicaid Other | Admitting: "Endocrinology

## 2019-03-12 DIAGNOSIS — E559 Vitamin D deficiency, unspecified: Secondary | ICD-10-CM

## 2019-03-12 NOTE — Progress Notes (Signed)
03/12/2019, 5:39 PM                                Endocrinology Telehealth Visit Follow up Note -During COVID -19 Pandemic  I connected with Joe Wilkinson on 03/12/2019   by telephone and verified that I am speaking with the correct person using two identifiers. Joe Wilkinson, [birth year]. he has verbally consented to this visit. All issues noted in this document were discussed and addressed. The format was not optimal for physical exam.   Subjective:    Patient ID: Joe Wilkinson, male    DOB: 01/13/1961, PCP Joe Duos, MD   Past Medical History:  Diagnosis Date  . DM (diabetes mellitus) ([place])   . Hypertension   . Stroke [facility])    Past Surgical History:  Procedure Laterality Date  . CRANIECTOMY Right 04/01/2017   Procedure: RIGHT DECOMPRESSIVE CRANIECTOMY;  Surgeon: Ditty, Kevan Ny, MD;  Location: [place];  Service: Neurosurgery;  Laterality: Right;  . ESOPHAGOGASTRODUODENOSCOPY N/A 04/13/2017   Procedure: ESOPHAGOGASTRODUODENOSCOPY (EGD);  Surgeon: Georganna Skeans, MD;  Location: [place];  Service: General;  Laterality: N/A;  bedside  . INCISION AND DRAINAGE PERIRECTAL ABSCESS N/A 09/28/2017   Procedure: IRRIGATION AND DEBRIDEMENT PERIANAL ABSCESS;  Surgeon: Virl Cagey, MD;  Location: [facility];  Service: General;  Laterality: N/A;  . PEG PLACEMENT N/A 04/13/2017   Procedure: PERCUTANEOUS ENDOSCOPIC GASTROSTOMY (PEG) PLACEMENT;  Surgeon: Georganna Skeans, MD;  Location: [facility] END[facility]COPY;  Service: General;  Laterality: N/A;   Social History   Socioeconomic History  . Marital status: Unknown    Spouse name: Not on file  . Number of children: Not on file  . Years of education: Not on file  . Highest education level: Not on file  Occupational History  . Not on file  Social Needs  . Financial resource strain: Not on file  . Food insecurity    Worry: Not on file    Inability: Not on file  . Transportation needs     Medical: Not on file    Non-medical: Not on file  Tobacco Use  . Smoking status: Former Smoker    Types: Cigarettes  . Smokeless tobacco: Never Used  . Tobacco comment: UTA  Substance and Sexual Activity  . Alcohol use: No    Comment: UTA  . Drug use: No    Comment: UTA  . Sexual activity: Not Currently    Birth control/protection: None  Lifestyle  . Physical activity    Days per week: Not on file    Minutes per session: Not on file  . Stress: Not on file  Relationships  . Social Herbalist on phone: Not on file    Gets together: Not on file    Attends religious service: Not on file    Active member of club or organization: Not on file    Attends meetings of clubs or organizations: Not on file    Relationship status: Not on file  Other Topics Concern  . Not on file  Social History Narrative  . Not on file   Outpatient Encounter Medications as of 03/12/2019  Medication  Sig  . acetaminophen (TYLENOL) 325 MG tablet Take 650 mg by mouth every 4 (four) hours as needed.   . Amino Acids-Protein Hydrolys (FEEDING SUPPLEMENT, PRO-STAT SUGAR FREE 64,) LIQD Take 30 mLs by mouth 2 (two) times daily between meals.   Marland Kitchen apixaban (ELIQUIS) 5 MG TABS tablet Take 5 mg by mouth 2 (two) times daily.  Marland Kitchen atorvastatin (LIPITOR) 20 MG tablet Take 20 mg by mouth daily.   . Baclofen 5 MG TABS Take 5 mg by mouth 2 (two) times daily.  . bisacodyl (DULCOLAX) 10 MG suppository Place 1 suppository (10 mg total) rectally daily as needed for moderate constipation.  . Cholecalciferol (VITAMIN D) 2000 units CAPS Take 1 capsule (2,000 Units total) by mouth daily.  . coal tar (NEUTROGENA T-GEL) 0.5 % shampoo Apply 1 application topically See admin instructions. Apply to scalp on shower days twice a week Tues., Fri.  . Dextromethorphan-guaiFENesin (ROBITUSSIN COUGH+CHEST CONG DM PO) Take 10 mLs by mouth every 6 (six) hours as needed.  . gabapentin (NEURONTIN) 100 MG capsule Take 200 mg by mouth at  bedtime.   Marland Kitchen ketoconazole (NIZORAL) 2 % cream Apply topically three times daily to ringworm area to left hip until resolved  . metoprolol succinate (TOPROL-XL) 25 MG 24 hr tablet Take 12.5 mg by mouth 2 (two) times daily.  . NON FORMULARY Diet type:  Upgrade to Regular Diet and continue thin liquids  . omeprazole (PRILOSEC) 40 MG capsule Take 40 mg by mouth daily.   . polyethylene glycol (MIRALAX / GLYCOLAX) packet Take 17 g by mouth daily.    No facility-administered encounter medications on file as of 03/12/2019.    ALLERGIES: Allergies  Allergen Reactions  . Cheese   . Penicillins Hives    Has patient had a PCN reaction causing immediate rash, facial/tongue/throat swelling, SOB or lightheadedness with hypotension: Unknown Has patient had a PCN reaction causing severe rash involving mucus membranes or skin necrosis: Unknown Has patient had a PCN reaction that required hospitalization: No Has patient had a PCN reaction occurring within the last 10 years: No If all of the above answers are "NO", then may proceed with Cephalosporin use.     VACCINATION STATUS: Immunization History  Administered Date(s) Administered  . Tdap 01/07/2019    HPI Joe Wilkinson is 58 y.o. male who is a nursing home resident, spoke with his nurse taking care of him.  He was seen previously in consultation for immobilization hypercalcemia.    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 - He ate to have continue to do better.  His repeat labs show calcium of 9.1, generally improving from 4.6 mg per DL.   He is status post Reclast infusion one time with proper expected clinical response subsequently.   Review of Systems   Objective:    There were no vitals taken for this visit.  Wt Readings from Last 3 Encounters:  02/21/19 171 lb 6.4 oz (77.7 kg)  01/22/19 169 lb 3.2 oz (76.7 kg)  12/27/18 166 lb (75.3 kg)    Recent Results (from the past 2160 hour(s))  CBC  with Differential/Platelet     Status: Abnormal   Collection Time: 03/08/19  9:30 AM  Result Value Ref Range   WBC 9.7 4.0 - 10.5 K/uL   RBC 4.72 4.22 - 5.81 MIL/uL   Hemoglobin 12.0 (L) 13.0 - 17.0 g/dL   HCT 41.6 39.0 - 52.0 %   MCV 88.1 80.0 -  100.0 fL   MCH 25.4 (L) 26.0 - 34.0 pg   MCHC 28.8 (L) 30.0 - 36.0 g/dL   RDW 14.6 11.5 - 15.5 %   Platelets 193 150 - 400 K/uL   nRBC 0.0 0.0 - 0.2 %   Neutrophils Relative % 50 %   Neutro Abs 4.8 1.7 - 7.7 K/uL   Lymphocytes Relative 35 %   Lymphs Abs 3.4 0.7 - 4.0 K/uL   Monocytes Relative 9 %   Monocytes Absolute 0.9 0.1 - 1.0 K/uL   Eosinophils Relative 5 %   Eosinophils Absolute 0.5 0.0 - 0.5 K/uL   Basophils Relative 1 %   Basophils Absolute 0.1 0.0 - 0.1 K/uL   Immature Granulocytes 0 %   Abs Immature Granulocytes 0.02 0.00 - 0.07 K/uL    Comment: Performed at [facility], [address], [place], [place] [address]  VITAMIN D 25 Hydroxy (Vit-D Deficiency, Fractures)     Status: Abnormal   Collection Time: 03/08/19  9:30 AM  Result Value Ref Range   Vit D, 25-Hydroxy 22.8 (L) 30.0 - 100.0 ng/mL    Comment: (NOTE) Vitamin D deficiency has been defined by the Institute of Medicine and an Endocrine Society practice guideline as a level of serum 25-OH vitamin D less than 20 ng/mL (1,2). The Endocrine Society went on to further define vitamin D insufficiency as a level between 21 and 29 ng/mL (2). 1. IOM (Institute of Medicine). 2010. Dietary reference   intakes for calcium and D. [place]: The   [employer]. 2. Holick MF, Binkley [place], Bischoff-Ferrari HA, et al.   Evaluation, treatment, and prevention of vitamin D   deficiency: an Endocrine Society clinical practice   guideline. JCEM. 2011 Jul; 96(7):1911-30. Performed At: [facility] [place], [place] [id number] Rush Farmer MD OV:[id number]   Comprehensive metabolic panel     Status: Abnormal   Collection Time: 03/08/19  9:30 AM   Result Value Ref Range   Sodium 138 135 - 145 mmol/L   Potassium 4.2 3.5 - 5.1 mmol/L   Chloride 109 98 - 111 mmol/L   CO2 23 22 - 32 mmol/L   Glucose, Bld 97 70 - 99 mg/dL   BUN 15 6 - 20 mg/dL   Creatinine, Ser 0.79 0.61 - 1.24 mg/dL   Calcium 9.1 8.9 - 10.3 mg/dL   Total Protein 6.6 6.5 - 8.1 g/dL   Albumin 3.1 (L) 3.5 - 5.0 g/dL   AST 10 (L) 15 - 41 U/L   ALT 17 0 - 44 U/L   Alkaline Phosphatase 64 38 - 126 U/L   Total Bilirubin 0.4 0.3 - 1.2 mg/dL   GFR calc non Af Amer >60 >60 mL/min   GFR calc Af Amer >60 >60 mL/min   Anion gap 6 5 - 15    Comment: Performed at [facility], [address] [place], [place] [address]  TSH     Status: None   Collection Time: 03/08/19  9:30 AM  Result Value Ref Range   TSH 1.925 0.350 - 4.500 uIU/mL    Comment: Performed by a 3rd Generation assay with a functional sensitivity of <=0.01 uIU/mL. Performed at [facility], [address], [place], [place] [address]       Assessment & Plan:   1. 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  -He is status post Reclast infusion with subsequent improvement of calcium to stable normal range,  most recently 9.1.  -He will not need any further intervention at this time.  I have advised for them to increase his daily vitamin D supplement to 4000 units.   -He will return in 1 year with CMP.  - I advised patient to maintain close follow up with Joe Duos, MD for primary care needs.  Time for this visit: 15 minutes. Joe Wilkinson  participated in the discussions, expressed understanding, and voiced agreement with the above plans.  All questions were answered to his satisfaction. he is encouraged to contact clinic should he have any questions or concerns prior to his return visit.  Follow up plan: Return in about 1 year (around 03/11/2020) for Follow up with Pre-visit Labs.   Glade Lloyd, MD [facility] [address] [place], [place] [address] Phone: [phone]  Fax: [phone]     03/12/2019, 5:39 PM  This note was partially dictated with voice recognition software. Similar sounding words can be transcribed inadequately or may not  be corrected upon review.

## 2019-03-20 ENCOUNTER — Encounter: Payer: Self-pay | Admitting: Internal Medicine

## 2019-03-20 ENCOUNTER — Non-Acute Institutional Stay (SKILLED_NURSING_FACILITY): Payer: Medicaid Other | Admitting: Internal Medicine

## 2019-03-20 DIAGNOSIS — E1159 Type 2 diabetes mellitus with other circulatory complications: Secondary | ICD-10-CM | POA: Diagnosis not present

## 2019-03-20 DIAGNOSIS — K219 Gastro-esophageal reflux disease without esophagitis: Secondary | ICD-10-CM

## 2019-03-20 DIAGNOSIS — I10 Essential (primary) hypertension: Secondary | ICD-10-CM | POA: Diagnosis not present

## 2019-03-20 DIAGNOSIS — I480 Paroxysmal atrial fibrillation: Secondary | ICD-10-CM | POA: Diagnosis not present

## 2019-03-20 NOTE — Progress Notes (Signed)
Location:  [place] Room Number: 117/D Place of Service:  SNF (31)  Hennie Duos, MD  Patient Care Team: Hennie Duos, MD as PCP - General (Internal Medicine) [facility], [place] [name], Phylis Bougie, NP as Nurse Practitioner (Geriatric Medicine)  Extended Emergency Contact Information Primary Emergency Contact: Theresia Bough [place] Phone: [phone] Relation: Sister Secondary Emergency Contact: Nickie Retort [place] Mobile Phone: [phone] Relation: Aunt    Allergies: Cheese and Penicillins  Chief Complaint  Patient presents with  . Medical Management of Chronic Issues    Routine visit of medical management  . Immunizations    PPSV-23, Flu Shot  . Quality Metric Gaps    Eye Exam    HPI: Patient is 58 y.o. male who is being seen for routine issues of hypertension, paroxysmal atrial fib, and GERD.  Past Medical History:  Diagnosis Date  . DM (diabetes mellitus) ([place])   . Hypertension   . Stroke [facility])     Past Surgical History:  Procedure Laterality Date  . CRANIECTOMY Right 04/01/2017   Procedure: RIGHT DECOMPRESSIVE CRANIECTOMY;  Surgeon: Ditty, Kevan Ny, MD;  Location: [place];  Service: Neurosurgery;  Laterality: Right;  . ESOPHAGOGASTRODUODENOSCOPY N/A 04/13/2017   Procedure: ESOPHAGOGASTRODUODENOSCOPY (EGD);  Surgeon: Georganna Skeans, MD;  Location: [place];  Service: General;  Laterality: N/A;  bedside  . INCISION AND DRAINAGE PERIRECTAL ABSCESS N/A 09/28/2017   Procedure: IRRIGATION AND DEBRIDEMENT PERIANAL ABSCESS;  Surgeon: Virl Cagey, MD;  Location: [facility];  Service: General;  Laterality: N/A;  . PEG PLACEMENT N/A 04/13/2017   Procedure: PERCUTANEOUS ENDOSCOPIC GASTROSTOMY (PEG) PLACEMENT;  Surgeon: Georganna Skeans, MD;  Location: [place];  Service: General;  Laterality: N/A;    Outpatient Encounter Medications as of  03/20/2019  Medication Sig  . acetaminophen (TYLENOL) 325 MG tablet Take 650 mg by mouth every 4 (four) hours as needed.   . Amino Acids-Protein Hydrolys (FEEDING SUPPLEMENT, PRO-STAT SUGAR FREE 64,) LIQD Take 30 mLs by mouth 2 (two) times daily between meals.   Marland Kitchen apixaban (ELIQUIS) 5 MG TABS tablet Take 5 mg by mouth 2 (two) times daily.  Marland Kitchen atorvastatin (LIPITOR) 20 MG tablet Take 20 mg by mouth daily.   . Baclofen 5 MG TABS Take 5 mg by mouth 2 (two) times daily.  . bisacodyl (DULCOLAX) 10 MG suppository Place 1 suppository (10 mg total) rectally daily as needed for moderate constipation.  . Cholecalciferol 25 MCG (1000 UT) tablet Take 2,000 Units by mouth 2 (two) times daily.  . coal tar (NEUTROGENA T-GEL) 0.5 % shampoo Apply 1 application topically See admin instructions. Apply to scalp on shower days twice a week Wed. And Sat.  . Dextromethorphan-guaiFENesin (ROBITUSSIN COUGH+CHEST CONG DM PO) Take 10 mLs by mouth every 6 (six) hours as needed.  . gabapentin (NEURONTIN) 100 MG capsule Take 200 mg by mouth at bedtime.   . metoprolol succinate (TOPROL-XL) 25 MG 24 hr tablet Take 12.5 mg by mouth 2 (two) times daily.  . NON FORMULARY Diet type:  Upgrade to Regular Diet and continue thin liquids  . omeprazole (PRILOSEC) 40 MG capsule Take 40 mg by mouth daily.   . polyethylene glycol (MIRALAX / GLYCOLAX) packet Take 17 g by mouth daily.    No facility-administered encounter medications on file as of 03/20/2019.      No orders of the defined types were placed in this encounter.   Immunization History  Administered  Date(s) Administered  . Tdap 01/07/2019    Social History   Tobacco Use  . Smoking status: Former Smoker    Types: Cigarettes  . Smokeless tobacco: Never Used  . Tobacco comment: UTA  Substance Use Topics  . Alcohol use: No    Comment: UTA    Review of Systems  DATA OBTAINED: from patient-limited; nursing-no acute concerns GENERAL:  no fevers, fatigue, appetite  changes SKIN: No itching, rash HEENT: No complaint RESPIRATORY: No cough, wheezing, SOB CARDIAC: No chest pain, palpitations, lower extremity edema  GI: No abdominal pain, No N/V/D or constipation, No heartburn or reflux  GU: No dysuria, frequency or urgency, or incontinence  MUSCULOSKELETAL: No unrelieved bone/joint pain NEUROLOGIC: No headache, dizziness  PSYCHIATRIC: No overt anxiety or sadness  Vitals:   03/20/19 1110  BP: 119/73  Pulse: 78  Resp: 17  Temp: 98.3 F (36.8 C)  SpO2: 95%   Body mass index is 28.02 kg/m. Physical Exam  GENERAL APPEARANCE: Alert, No acute distress  SKIN: No diaphoresis rash HEENT: Unremarkable RESPIRATORY: Breathing is even, unlabored. Lung sounds are clear   CARDIOVASCULAR: Heart RRR no murmurs, rubs or gallops. No peripheral edema  GASTROINTESTINAL: Abdomen is soft, non-tender, not distended w/ normal bowel sounds.  GENITOURINARY: Bladder non tender, not distended  MUSCULOSKELETAL: No abnormal joints or musculature NEUROLOGIC: Cranial nerves 2-12 grossly intact; left hemiparesis PSYCHIATRIC: Mood and affect appropriate to situation, no behavioral issues  Patient Active Problem List   Diagnosis Date Noted  . Hypertension associated with type 2 diabetes mellitus ([place]) 01/24/2019  . GERD without esophagitis 11/03/2018  . Dyslipidemia associated with type 2 diabetes mellitus ([place]) 11/03/2018  . Diabetic peripheral neuropathy ([place]) 11/03/2018  . Chronic constipation 11/03/2018  . Dandruff 11/03/2018  . Gluteal abscess 10/03/2017  . Benign essential HTN   . Hemiparesis affecting left side as late effect of cerebrovascular accident (CVA) ([place])   . Abscess, perianal   . Vitamin D deficiency 09/13/2017  . 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  . Type 1 diabetes mellitus with complications ([place]) 59/56/3875  . Type 2 diabetes mellitus with neurological complications ([place])   . AF (paroxysmal  atrial fibrillation) ([place]) 04/27/2017  . Cerebrovascular accident (CVA) due to embolism of right middle cerebral artery ([place]) 04/01/2017  . Cytotoxic cerebral edema (H[facility]) 04/01/2017    CMP     Component Value Date/Time   NA 138 03/08/2019 0930   K 4.2 03/08/2019 0930   CL 109 03/08/2019 0930   CO2 23 03/08/2019 0930   GLUCOSE 97 03/08/2019 0930   BUN 15 03/08/2019 0930   CREATININE 0.79 03/08/2019 0930   CALCIUM 9.1 03/08/2019 0930   CALCIUM 11.5 (H) 09/05/2017 0709   PROT 6.6 03/08/2019 0930   ALBUMIN 3.1 (L) 03/08/2019 0930   AST 10 (L) 03/08/2019 0930   ALT 17 03/08/2019 0930   ALKPHOS 64 03/08/2019 0930   BILITOT 0.4 03/08/2019 0930   GFRNONAA >60 03/08/2019 0930   GFRAA >60 03/08/2019 0930   Recent Labs    07/14/18 0415 11/28/18 0657 03/08/19 0930  NA 140 140 138  K 3.6 4.4 4.2  CL 110 108 109  CO2 26 27 23   GLUCOSE 75 93 97  BUN 10 16 15   CREATININE 0.86 0.86 0.79  CALCIUM 9.2 9.1 9.1   Recent Labs    11/28/18 0657 03/08/19 0930  AST 9* 10*  ALT 16 17  ALKPHOS 57 64  BILITOT 0.3 0.4  PROT  6.8 6.6  ALBUMIN 3.1* 3.1*   Recent Labs    06/12/18 0700 07/14/18 0415 03/08/19 0930  WBC 8.2 8.8 9.7  NEUTROABS 4.1 4.6 4.8  HGB 14.1 13.8 12.0*  HCT 49.1 47.5 41.6  MCV 93.9 92.8 88.1  PLT 199 156 193   Recent Labs    05/29/18 0335 11/28/18 0700  CHOL 165 146  LDLCALC 73 84  TRIG 321* 114   Lab Results  Component Value Date   MICROALBUR 43.0 (H) 11/07/2018   Lab Results  Component Value Date   TSH 1.925 03/08/2019   Lab Results  Component Value Date   HGBA1C 4.3 (L) 10/30/2018   Lab Results  Component Value Date   CHOL 146 11/28/2018   HDL 39 (L) 11/28/2018   LDLCALC 84 11/28/2018   TRIG 114 11/28/2018   CHOLHDL 3.7 11/28/2018    Significant Diagnostic Results in last 30 days:  No results found.  Assessment and Plan  Hypertension associated with type 2 diabetes mellitus ([place]) Controlled; continue metoprolol 12.5 mg twice daily   AF (paroxysmal atrial fibrillation) (HCC) Rate controlled on metoprolol 12.5 mg twice daily and prophylaxed with Eliquis 5 mg twice daily; continue current regimen  GERD without esophagitis No reports of reflux or aspiration; continue omeprazole 40 mg daily    Hennie Duos, MD

## 2019-03-24 ENCOUNTER — Encounter: Payer: Self-pay | Admitting: Internal Medicine

## 2019-03-24 NOTE — Assessment & Plan Note (Signed)
No reports of reflux or aspiration; continue omeprazole 40 mg daily 

## 2019-03-24 NOTE — Assessment & Plan Note (Signed)
Controlled; continue metoprolol 12.5 mg twice daily 

## 2019-03-24 NOTE — Assessment & Plan Note (Signed)
Rate controlled on metoprolol 12.5 mg twice daily and prophylaxed with Eliquis 5 mg twice daily; continue current regimen

## 2019-04-02 ENCOUNTER — Ambulatory Visit (INDEPENDENT_AMBULATORY_CARE_PROVIDER_SITE_OTHER): Payer: Medicaid Other | Admitting: Adult Health

## 2019-04-02 ENCOUNTER — Encounter: Payer: Self-pay | Admitting: Adult Health

## 2019-04-02 VITALS — BP 154/78 | HR 90 | Temp 98.0°F | Ht 66.0 in

## 2019-04-02 DIAGNOSIS — Z8673 Personal history of transient ischemic attack (TIA), and cerebral infarction without residual deficits: Secondary | ICD-10-CM

## 2019-04-02 DIAGNOSIS — G8114 Spastic hemiplegia affecting left nondominant side: Secondary | ICD-10-CM

## 2019-04-02 DIAGNOSIS — E1149 Type 2 diabetes mellitus with other diabetic neurological complication: Secondary | ICD-10-CM

## 2019-04-02 DIAGNOSIS — I480 Paroxysmal atrial fibrillation: Secondary | ICD-10-CM

## 2019-04-02 DIAGNOSIS — E785 Hyperlipidemia, unspecified: Secondary | ICD-10-CM

## 2019-04-02 DIAGNOSIS — I10 Essential (primary) hypertension: Secondary | ICD-10-CM | POA: Diagnosis not present

## 2019-04-02 MED ORDER — BACLOFEN 5 MG PO TABS: 5.0000 mg | ORAL_TABLET | Freq: Three times a day (TID) | ORAL | 3 refills | Status: DC

## 2019-04-02 NOTE — Patient Instructions (Signed)
Due to ongoing spasticity pain, recommend increasing baclofen dose from 5 mg twice daily to 5 mg a.m. and 10 mg p.m. (bedtime).  Recommend increasing 5 mg weekly as needed and as tolerated for pain relief.  If no benefit with increasing dose, may trial tizanidine  Follow-up with neurosurgery in [facility] for further plan on undergoing cranioplasty  Continue to work with physical therapy and ensuring range of motion exercises to avoid worsening contractures/pain  Continue Eliquis (apixaban) daily  and atorvastatin for secondary stroke prevention  Continue to follow up with PCP regarding cholesterol and blood pressure management   Continue to monitor blood pressure at home  Maintain strict control of hypertension with blood pressure goal below 130/90, diabetes with hemoglobin A1c goal below 6.5% and cholesterol with LDL cholesterol (bad cholesterol) goal below 70 mg/dL. I also advised the patient to eat a healthy diet with plenty of whole grains, cereals, fruits and vegetables, exercise regularly and maintain ideal body weight.  Followup in the future with me in 1 year or call earlier if needed       Thank you for coming to see [place] at [facility]. I hope we have been able to provide you high quality care today.  You may receive a patient satisfaction survey over the next few weeks. We would appreciate your feedback and comments so that we may continue to improve ourselves and the health of our patients.

## 2019-04-02 NOTE — Progress Notes (Signed)
[facility]  PATIENT: Joe Wilkinson DOB: 1960/10/24   REASON FOR VISIT: Follow-up for history of stroke August 2018 HISTORY FROM: Patient and CNA from [facility] center  Chief Complaint  Patient presents with  . Follow-up    Yearly f/u. Alone. Treatment room. No new concerns at this time.      HISTORY OF PRESENT ILLNESS: 07/11/17 Joe Wilkinson is a 41 year african [place] male seen today for first office follow-up visit following hospital admission for stroke in August 2018.  He is accompanied by [facility] center nursing home attendant where he stays. No family is available. I have personally reviewed the electronic medical records and imaging films. Joe Wilkinson an 58 y.o.malewho presented to [facility] in [place]. He was found lethargic at a bus stop and then became unresponsive. EMS reported O2 sat of 80% and BP of 90/40. His CBG revealed hyperglycemia. On arrival to [facility] the patient patient could tell the examiner his name but was unable to provide any other information. He was restless, moaning and moving all extremities, not following commands. Labs were consistent with DKA and he was started on insulin drip. Initial CT head at [facility] showed no acute abnormality. He developed Kussmaul breathing requiring intubation. 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 He was transported to [facility] for further care. He   developed hypotension requiring pressors.  He was taken for an emergent right hemi-craniectomy for malignant cerebral edema by Dr. Cyndy Freeze and had a prolonged neuro ICU stay which was complicated by acute respiratory failure with hypoxia, hypercarbia, septic shock secondary to Streptococcus agalactiae  pneumonia.  He also developed new onset atrial fibrillation with rapid ventricular rate.  E. coli and  Klebsiella urinary tract infection.  Perianal cellulitis with small abscess.  He had a prolonged ICU stay and was eventually weaned off ventilatory support.  Follow-up CT scan showed significant right hemispheric edema with herniation out outside craniotomy bone defect.  He underwent PEG tube placement by trauma team.  He was discharged on 05/29/17 to pain center for rehabilitation.  He is mental status has improved and he can speak and communicate but has persistent spastic dense hemiplegia.  He develops intermittent confusion.  He is still has persistent right-sided scalp swelling and has been seen in neurosurgery follow-up clinic on 06/21/17 and the decision to do cranioplasty has been postponed for a few more months until the swelling subsides.  He can eat a dysphagia 3 diet but is still getting mostly PEG tube feeds.  Patient has had a few falls.  He remains full support and requires 2 person support to stand and take a few steps.  He is getting ongoing physical occupational and speech therapy. UPDATE 3/5/2019[name], 58 year old male returns for follow-up with history of stroke in August 2018.  He remains on Eliquis for secondary stroke prevention and atrial fibrillation.  He was readmitted to the hospital on 09/24/2017 for sepsis and perianal abscess.  His PEG tube was dislodged.  He continues to have hemiparesis affecting the left side from his stroke event.  He continues to have a persistent right-sided scalp swelling and cranioplasty has been postponed until the swelling subsides.  He remains on a puree  diet and gets PEG feedings at night. Patient is wheelchair-bound.  His physical therapy was stopped because patient was not making  progress.  No recent falls.  He is dependent for all activities of daily living.   He returns for reevaluation UPDATE 9/5/2019[name], 58 year old male returns for follow-up with a history of stroke event in August 2018.  He is currently residing in a skilled  facility.  He remains on Eliquis for secondary stroke prevention and atrial fibrillation.  He has not had further stroke or TIA symptoms.  He has a PEG tube with feedings at  night.  He is on a regular diet.  He continues to have significant left hemiparesis and a persistent right sided scalp swelling.  His cranioplasty has been postponed.  He sees a physician at [facility] he is due for follow-up in 6 months the patient says.  He is wheelchair-bound transfers with a Hoyer lift.  No recent falls.  He remains dependent for all activities of daily living except feeding himself.  Blood pressure in the office today 151/94.  Most recent hemoglobin A1c done 12/06/17  was 4.5.  Reviewed recent CBC BMP from 03/28/2018 within normal limits.  Vitamin D level 30.2 he returns for reevaluation.  04/02/2019 update: Joe Wilkinson is a 58 year old male who is being seen today for stroke follow-up.  Residual deficits of left spastic hemiplegia and persistent right-sided scalp swelling.  Per patient report, will be having additional imaging to potentially undergo cranioplasty but is unsure when this will occur.  He continues to be followed in [facility] by neurosurgery.  He is wheelchair-bound due to residual deficits and dependent for majority of care.  Currently on baclofen 5 mg twice daily for spasticity pain but does endorse ongoing pain with only mild improvement.  Blood pressure today 154/78.  Continues on Eliquis for secondary stroke prevention and atrial fibrillation without bleeding or bruising.  Continues on atorvastatin 20 mg daily without myalgias.  He continues to reside at [facility].  No further concerns at this time.   REVIEW OF SYSTEMS: Full 14 system review of systems performed and notable only for those listed, all others are neg:  Constitutional: neg  Cardiovascular: neg Ear/Nose/Throat: neg  Skin: neg Eyes: neg Respiratory: neg Gastroitestinal: neg  Hematology/Lymphatic: neg  Endocrine: neg Musculoskeletal:  wheelchair-bound; left-sided spasticity pain Allergy/Immunology: neg Neurological: neg Psychiatric: neg Sleep : neg   ALLERGIES: Allergies  Allergen Reactions  . Cheese   . Penicillins Hives    Has patient had a PCN reaction causing immediate rash, facial/tongue/throat swelling, SOB or lightheadedness with hypotension: Unknown Has patient had a PCN reaction causing severe rash involving mucus membranes or skin necrosis: Unknown Has patient had a PCN reaction that required hospitalization: No Has patient had a PCN reaction occurring within the last 10 years: No If all of the above answers are "NO", then may proceed with Cephalosporin use.     HOME MEDICATIONS: Outpatient Medications Prior to Visit  Medication Sig Dispense Refill  . acetaminophen (TYLENOL) 325 MG tablet Take 650 mg by mouth every 4 (four) hours as needed.     . Amino Acids-Protein Hydrolys (FEEDING SUPPLEMENT, PRO-STAT SUGAR FREE 64,) LIQD Take 30 mLs by mouth 2 (two) times daily between meals.     Marland Kitchen apixaban (ELIQUIS) 5 MG TABS tablet Take 5 mg by mouth 2 (two) times daily.    Marland Kitchen atorvastatin (LIPITOR) 20 MG tablet Take 20 mg by mouth daily.     . Baclofen 5 MG TABS Take 5 mg by mouth 2 (two) times daily.    . bisacodyl (DULCOLAX) 10 MG suppository Place  1 suppository (10 mg total) rectally daily as needed for moderate constipation. 12 suppository 0  . Cholecalciferol 25 MCG (1000 UT) tablet Take 2,000 Units by mouth 2 (two) times daily.    . coal tar (NEUTROGENA T-GEL) 0.5 % shampoo Apply 1 application topically See admin instructions. Apply to scalp on shower days twice a week Wed. And Sat.    . Dextromethorphan-guaiFENesin (ROBITUSSIN COUGH+CHEST CONG DM PO) Take 10 mLs by mouth every 6 (six) hours as needed.    . gabapentin (NEURONTIN) 100 MG capsule Take 200 mg by mouth at bedtime.     . metoprolol succinate (TOPROL-XL) 25 MG 24 hr tablet Take 12.5 mg by mouth 2 (two) times daily.    . NON FORMULARY Diet type:   Upgrade to Regular Diet and continue thin liquids    . omeprazole (PRILOSEC) 40 MG capsule Take 40 mg by mouth daily.     . polyethylene glycol (MIRALAX / GLYCOLAX) packet Take 17 g by mouth daily.      No facility-administered medications prior to visit.     PAST MEDICAL HISTORY: Past Medical History:  Diagnosis Date  . DM (diabetes mellitus) ([place])   . Hypertension   . Stroke [facility])     PAST SURGICAL HISTORY: Past Surgical History:  Procedure Laterality Date  . CRANIECTOMY Right 04/01/2017   Procedure: RIGHT DECOMPRESSIVE CRANIECTOMY;  Surgeon: Ditty, Kevan Ny, MD;  Location: [place];  Service: Neurosurgery;  Laterality: Right;  . ESOPHAGOGASTRODUODENOSCOPY N/A 04/13/2017   Procedure: ESOPHAGOGASTRODUODENOSCOPY (EGD);  Surgeon: Georganna Skeans, MD;  Location: [place];  Service: General;  Laterality: N/A;  bedside  . INCISION AND DRAINAGE PERIRECTAL ABSCESS N/A 09/28/2017   Procedure: IRRIGATION AND DEBRIDEMENT PERIANAL ABSCESS;  Surgeon: Virl Cagey, MD;  Location: [facility];  Service: General;  Laterality: N/A;  . PEG PLACEMENT N/A 04/13/2017   Procedure: PERCUTANEOUS ENDOSCOPIC GASTROSTOMY (PEG) PLACEMENT;  Surgeon: Georganna Skeans, MD;  Location: [place];  Service: General;  Laterality: N/A;    FAMILY HISTORY: History reviewed. No pertinent family history.  SOCIAL HISTORY: Social History   Socioeconomic History  . Marital status: Unknown    Spouse name: Not on file  . Number of children: Not on file  . Years of education: Not on file  . Highest education level: Not on file  Occupational History  . Not on file  Social Needs  . Financial resource strain: Not on file  . Food insecurity    Worry: Not on file    Inability: Not on file  . Transportation needs    Medical: Not on file    Non-medical: Not on file  Tobacco Use  . Smoking status: Former Smoker    Types: Cigarettes  . Smokeless tobacco: Never Used  . Tobacco comment: UTA  Substance and Sexual  Activity  . Alcohol use: No    Comment: UTA  . Drug use: No    Comment: UTA  . Sexual activity: Not Currently    Birth control/protection: None  Lifestyle  . Physical activity    Days per week: Not on file    Minutes per session: Not on file  . Stress: Not on file  Relationships  . Social Herbalist on phone: Not on file    Gets together: Not on file    Attends religious service: Not on file    Active member of club or organization: Not on file    Attends meetings of clubs or organizations: Not on  file    Relationship status: Not on file  . Intimate partner violence    Fear of current or ex partner: Not on file    Emotionally abused: Not on file    Physically abused: Not on file    Forced sexual activity: Not on file  Other Topics Concern  . Not on file  Social History Narrative  . Not on file     PHYSICAL EXAM  Vitals:   04/02/19 1402  BP: (!) 154/78  Pulse: 90  Temp: 98 F (36.7 C)  TempSrc: Oral  Height: 5\' 6"  (1.676 m)   Body mass index is 28.02 kg/m.  Generalized: Well developed, pleasant middle-aged African-American male, in no acute distress  Head: normocephalic and atraumatic,. Oropharynx benign  Neck: Supple, no carotid bruits  Cardiac: Regular rate rhythm, no murmur  Musculoskeletal: No deformity   Neurological examination   Mentation: Alert oriented to time, place, history taking. Attention span and concentration appropriate. Recent and remote memory intact.  Follows all commands, mild dysarthria.   Cranial nerve II-XII: Fundoscopic exam not done.Pupils were equal round reactive to light extraocular movements were full, visual field with dense left homonymous hemianopia,  on confrontational test.  Mild left lower facial weakness  hearing was intact to finger rubbing bilaterally. Uvula tongue midline. head turning and shoulder shrug were normal and symmetric.Tongue protrusion into cheek strength was normal. Motor: normal bulk and tone, full  strength in the BUE, BLE, on the right dense spastic left hemiplegia 0/5 with flexion contractures in the left hand and spasticity left upper and lower extremity with limited ROM Sensory: normal and symmetric to light touch, pinprick, and  Vibration in the upper and lower extremities on the right diminished on the left Coordination: finger-nose-finger, heel-to-shin performed accurately on the right unable on the left  Reflexes: 2+ on the right brisker on the left plantar responses were downgoing on the right and upgoing on the left  Gait and Station: [employer]   DIAGNOSTIC DATA (LABS, IMAGING, TESTING) - I reviewed patient records, labs, notes, testing and imaging myself where available.  Lab Results  Component Value Date   WBC 9.7 03/08/2019   HGB 12.0 (L) 03/08/2019   HCT 41.6 03/08/2019   MCV 88.1 03/08/2019   PLT 193 03/08/2019      Component Value Date/Time   NA 138 03/08/2019 0930   K 4.2 03/08/2019 0930   CL 109 03/08/2019 0930   CO2 23 03/08/2019 0930   GLUCOSE 97 03/08/2019 0930   BUN 15 03/08/2019 0930   CREATININE 0.79 03/08/2019 0930   CALCIUM 9.1 03/08/2019 0930   CALCIUM 11.5 (H) 09/05/2017 0709   PROT 6.6 03/08/2019 0930   ALBUMIN 3.1 (L) 03/08/2019 0930   AST 10 (L) 03/08/2019 0930   ALT 17 03/08/2019 0930   ALKPHOS 64 03/08/2019 0930   BILITOT 0.4 03/08/2019 0930   GFRNONAA >60 03/08/2019 0930   GFRAA >60 03/08/2019 0930   Lab Results  Component Value Date   CHOL 146 11/28/2018   HDL 39 (L) 11/28/2018   LDLCALC 84 11/28/2018   TRIG 114 11/28/2018   CHOLHDL 3.7 11/28/2018   Lab Results  Component Value Date   HGBA1C 4.3 (L) 10/30/2018   Lab Results  Component Value Date   [id number] 04/04/2017   Lab Results  Component Value Date   TSH 1.925 03/08/2019      ASSESSMENT AND PLAN 51 year African-American male with large right middle cerebral artery infarct due to  atrial fibrillation in August 2018 status post right hemicraniectomy  for malignant cerebral edema with significant residual spastic left hemiplegia and significant disability    PLAN: Recommend increasing baclofen dose to 5 mg 3 times daily due to ongoing pain Continue Eliquis for secondary stroke prevention and atrial fibrillation Continue atorvastatin for secondary stroke prevention Continue to follow with PCP for HTN, HLD and DM management Follow-up with neurosurgery for cranioplasty of the right skull defect has been delayed  Maintain strict control of hypertension with blood pressure goal below 130/90, diabetes with hemoglobin A1c goal below 6.5% and cholesterol with LDL cholesterol (bad cholesterol) goal below 70 mg/dL. I also advised the patient to eat a healthy diet with plenty of whole grains, cereals, fruits and vegetables, exercise regularly and maintain ideal body weight.  Follow-up in 1 year or call earlier if needed   I spent 25 minutes in total face to face time with the patient more than 50% of which was spent counseling and coordination of care, reviewing test results reviewing medications and discussing and reviewing the diagnosis of  ,significant residual left hemiplegia with spasticity pain, atrial fibrillation,, risk for recurrent stroke/TIAs.    Frann Rider, AGNP-BC  [facility] [address] [place], [place] [address]  Phone [phone] Fax [phone] Note: This document was prepared with digital dictation and possible smart phrase technology. Any transcriptional errors that result from this process are unintentional.

## 2019-04-03 NOTE — Progress Notes (Signed)
I agree with the above plan 

## 2019-04-04 ENCOUNTER — Encounter: Payer: Self-pay | Admitting: Adult Health

## 2019-04-04 ENCOUNTER — Non-Acute Institutional Stay (SKILLED_NURSING_FACILITY): Payer: Medicaid Other | Admitting: Adult Health

## 2019-04-04 DIAGNOSIS — I69354 Hemiplegia and hemiparesis following cerebral infarction affecting left non-dominant side: Secondary | ICD-10-CM | POA: Diagnosis not present

## 2019-04-04 NOTE — Progress Notes (Signed)
Location:  [place] Room Number: 117-D Place of Service:  SNF (31)   CODE STATUS: Full Code  Allergies  Allergen Reactions  . Cheese   . Penicillins Hives    Has patient had a PCN reaction causing immediate rash, facial/tongue/throat swelling, SOB or lightheadedness with hypotension: Unknown Has patient had a PCN reaction causing severe rash involving mucus membranes or skin necrosis: Unknown Has patient had a PCN reaction that required hospitalization: No Has patient had a PCN reaction occurring within the last 10 years: No If all of the above answers are "NO", then may proceed with Cephalosporin use.     Chief Complaint  Patient presents with  . Acute Visit    Patient is seen for left shoulder pain.    HPI:  Staff report that he is complaining of left shoulder pain which not being managed. He states that he pain is achy in nature and hurts all the time. He was started on biofreeze to the shoulder yesterday. He tells me that this is working well and would for it to be twice daily on a routine basis.   Past Medical History:  Diagnosis Date  . DM (diabetes mellitus) ([place])   . Hypertension   . Stroke [facility])     Past Surgical History:  Procedure Laterality Date  . CRANIECTOMY Right 04/01/2017   Procedure: RIGHT DECOMPRESSIVE CRANIECTOMY;  Surgeon: Ditty, Kevan Ny, MD;  Location: [place];  Service: Neurosurgery;  Laterality: Right;  . ESOPHAGOGASTRODUODENOSCOPY N/A 04/13/2017   Procedure: ESOPHAGOGASTRODUODENOSCOPY (EGD);  Surgeon: Georganna Skeans, MD;  Location: [place];  Service: General;  Laterality: N/A;  bedside  . INCISION AND DRAINAGE PERIRECTAL ABSCESS N/A 09/28/2017   Procedure: IRRIGATION AND DEBRIDEMENT PERIANAL ABSCESS;  Surgeon: Virl Cagey, MD;  Location: [facility];  Service: General;  Laterality: N/A;  . PEG PLACEMENT N/A 04/13/2017   Procedure: PERCUTANEOUS ENDOSCOPIC GASTROSTOMY (PEG) PLACEMENT;  Surgeon: Georganna Skeans, MD;   Location: [facility];  Service: General;  Laterality: N/A;    Social History   Socioeconomic History  . Marital status: Unknown    Spouse name: Not on file  . Number of children: Not on file  . Years of education: Not on file  . Highest education level: Not on file  Occupational History  . Not on file  Social Needs  . Financial resource strain: Not on file  . Food insecurity    Worry: Not on file    Inability: Not on file  . Transportation needs    Medical: Not on file    Non-medical: Not on file  Tobacco Use  . Smoking status: Former Smoker    Types: Cigarettes  . Smokeless tobacco: Never Used  . Tobacco comment: UTA  Substance and Sexual Activity  . Alcohol use: No    Comment: UTA  . Drug use: No    Comment: UTA  . Sexual activity: Not Currently    Birth control/protection: None  Lifestyle  . Physical activity    Days per week: Not on file    Minutes per session: Not on file  . Stress: Not on file  Relationships  . Social Herbalist on phone: Not on file    Gets together: Not on file    Attends religious service: Not on file    Active member of club or organization: Not on file    Attends meetings of clubs or organizations: Not on file    Relationship status: Not  on file  . Intimate partner violence    Fear of current or ex partner: Not on file    Emotionally abused: Not on file    Physically abused: Not on file    Forced sexual activity: Not on file  Other Topics Concern  . Not on file  Social History Narrative  . Not on file   History reviewed. No pertinent family history.    VITAL SIGNS BP 109/83   Pulse 73   Temp 98.2 F (36.8 C) (Oral)   Resp 17   Ht 5\' 6"  (1.676 m)   Wt 173 lb 9.6 oz (78.7 kg)   SpO2 95%   BMI 28.02 kg/m   Outpatient Encounter Medications as of 04/04/2019  Medication Sig  . acetaminophen (TYLENOL) 325 MG tablet Take 650 mg by mouth every 4 (four) hours as needed. Not to exceed 3 grams in 24 hours  . Amino  Acids-Protein Hydrolys (FEEDING SUPPLEMENT, PRO-STAT SUGAR FREE 64,) LIQD Take 30 mLs by mouth 2 (two) times daily between meals.   Marland Kitchen apixaban (ELIQUIS) 5 MG TABS tablet Take 5 mg by mouth 2 (two) times daily.   Marland Kitchen atorvastatin (LIPITOR) 20 MG tablet Take 20 mg by mouth daily.   . Baclofen 5 MG TABS Take 5 mg by mouth 3 (three) times daily.  . bisacodyl (DULCOLAX) 10 MG suppository Place 1 suppository (10 mg total) rectally daily as needed for moderate constipation.  . Cholecalciferol 25 MCG (1000 UT) tablet Take 2,000 Units by mouth 2 (two) times daily.   . coal tar (NEUTROGENA T-GEL) 0.5 % shampoo Apply 1 application topically See admin instructions. Apply to scalp on shower days twice a week Wed. And Sat.  . Dextromethorphan-guaiFENesin (ROBITUSSIN COUGH+CHEST CONG DM PO) Take 10 mLs by mouth every 6 (six) hours as needed.  . gabapentin (NEURONTIN) 100 MG capsule Take 200 mg by mouth at bedtime.   . Menthol, Topical Analgesic, (BIOFREEZE) 4 % GEL Apply 1 application topically 2 (two) times daily as needed. Apply to left shoulder  . metoprolol succinate (TOPROL-XL) 25 MG 24 hr tablet Take 12.5 mg by mouth 2 (two) times daily.  . NON FORMULARY Diet type:  Upgrade to Regular Diet and continue thin liquids  . omeprazole (PRILOSEC) 40 MG capsule Take 40 mg by mouth daily.   . polyethylene glycol (MIRALAX / GLYCOLAX) packet Take 17 g by mouth daily.    No facility-administered encounter medications on file as of 04/04/2019.      SIGNIFICANT DIAGNOSTIC EXAMS   PREVIOUS:   08-24-18: ct of head: 1. 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 Increased volume loss of the infarcted brain as well as decreased herniation of brain and dura through the craniectomy defect. 2. No new intracranial abnormality.  NO NEW EXAMS.   LABS REVIEWED PREVIOUS:   05-29-18: chol 165; ldl 73; trig 321; hdl 28 hgb a1c 4.7  07-14-18: wbc 8.8; hgb 13.8; hct 47.5; mcv 92.8 plt 156;  glucose 75; bun 10; creat 0.86; k+ 3.6; na++ 140 ca 9.2  09-15-18: influenza: neg 10-30-18: hgb a1c 4.3 11-07-18: urine micro-albumin 43.0  11-28-18: glucose 93; bun 16; creat 0.86; k+ 4.4; an++ 140; ca 9.0; liver normal albumin 3.1; chol 146; ldl 84; trig 114; hdl 39   NO NEW LABS.    Review of Systems  Constitutional: Negative for malaise/fatigue.  Respiratory: Negative for cough and shortness of breath.   Cardiovascular: Negative for chest pain, palpitations and leg swelling.  Gastrointestinal: Negative for abdominal pain, constipation and heartburn.  Musculoskeletal: Positive for joint pain. Negative for back pain and myalgias.       Left shoulder pain   Skin: Negative.   Neurological: Negative for dizziness.  Psychiatric/Behavioral: The patient is not nervous/anxious.      Physical Exam Constitutional:      General: He is not in acute distress.    Appearance: He is well-developed. He is not diaphoretic.  Neck:     Musculoskeletal: Neck supple.     Thyroid: No thyromegaly.  Cardiovascular:     Rate and Rhythm: Normal rate and regular rhythm.     Pulses: Normal pulses.     Heart sounds: Normal heart sounds.  Pulmonary:     Effort: Pulmonary effort is normal. No respiratory distress.     Breath sounds: Normal breath sounds.  Abdominal:     General: Bowel sounds are normal. There is no distension.     Palpations: Abdomen is soft.     Tenderness: There is no abdominal tenderness.  Musculoskeletal:     Right lower leg: No edema.     Left lower leg: No edema.     Comments:  Left hemiplegia with stiffness present   Lymphadenopathy:     Cervical: No cervical adenopathy.  Skin:    General: Skin is warm and dry.  Neurological:     Mental Status: He is alert. Mental status is at baseline.     Comments: History of right decompression craniotomy     Psychiatric:        Mood and Affect: Mood normal.      ASSESSMENT/ PLAN:  TODAY;   1. Hemiparesis affecting left side as  late effect of cerebrovascular accident: is having left shoulder pain: will change biofreeze to twice daily routinely and will monitor his status.   MD is aware of resident's narcotic use and is in agreement with current plan of care. We will attempt to wean resident as appropriate.  Ok Edwards NP [facility]  Contact [phone] Monday through Friday 8am- 5pm  After hours call [phone]

## 2019-04-05 IMAGING — DX DG CHEST 1V
1 series · 1 of 1 positions shown · non-contrast
Comparison: Radiograph same day.

CLINICAL DATA: Central line placement.

EXAM:
CHEST 1 VIEW

[chest ap]
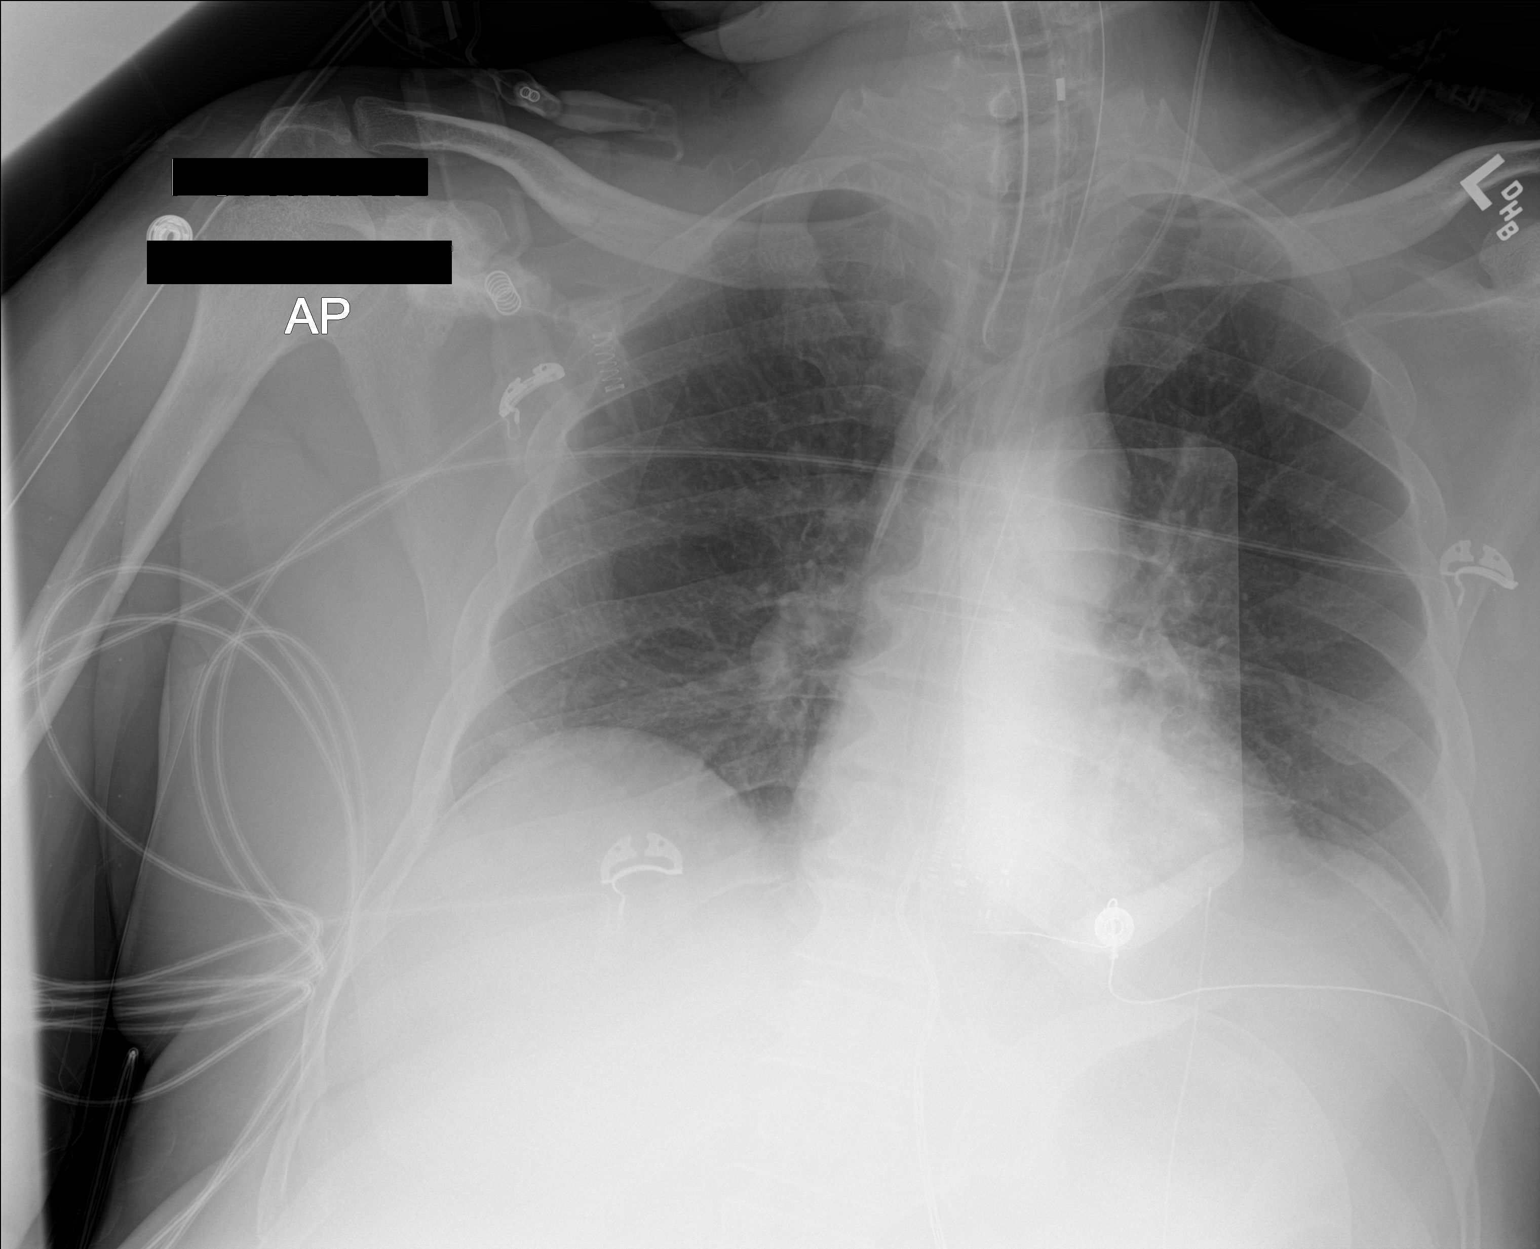

[1 of 1 positions shown; findings below may reference images not displayed]

FINDINGS: The heart size and mediastinal contours are within normal limits.
Both lungs are clear. No pneumothorax or pleural effusion is noted.
Endotracheal and nasogastric tubes are unchanged in position.
Interval placement of left internal jugular catheter, with distal
tip in expected position of SVC. The visualized skeletal structures
are unremarkable.
IMPRESSION: Stable support apparatus. Interval placement of left internal
jugular catheter, with distal tip in expected position of SVC. No
acute cardiopulmonary abnormality seen.

## 2019-04-05 IMAGING — CT CT HEAD W/O CM
3 series · 16 of 46 positions shown, 19 images · non-contrast
Comparison: None.

CLINICAL DATA: 55 y/o  M; unresponsive to stimuli.

EXAM:
CT HEAD WITHOUT CONTRAST
TECHNIQUE: Contiguous axial images were obtained from the base of the skull
through the vertex without intravenous contrast.

[Series 3: head wo · axial · 0.42mm/px · z∈[+1222,+1342]mm · 10 of 29 slices shown, 13 images]
[im 3/29  brain]
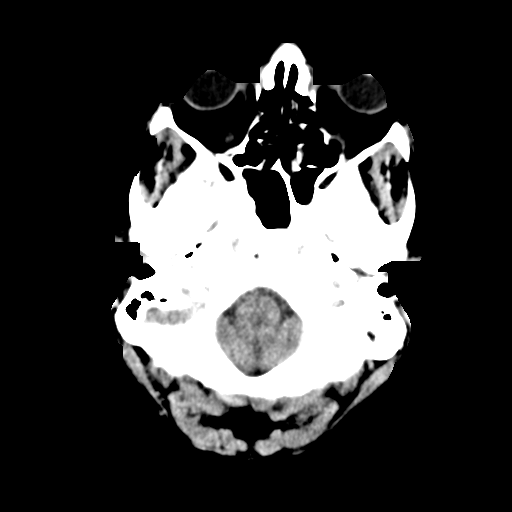
[im 3/29  bone]
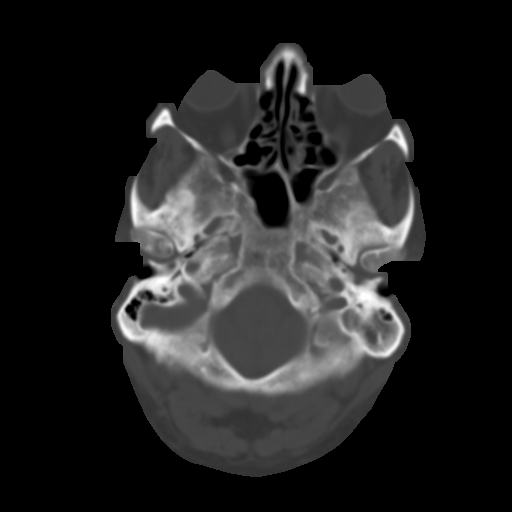
[im 6/29  brain]
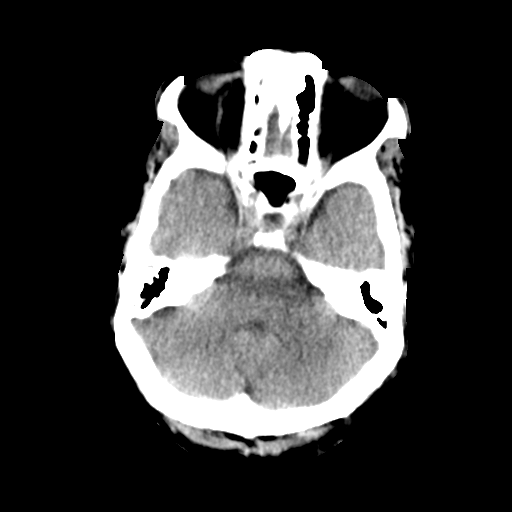
[im 8/29  brain]
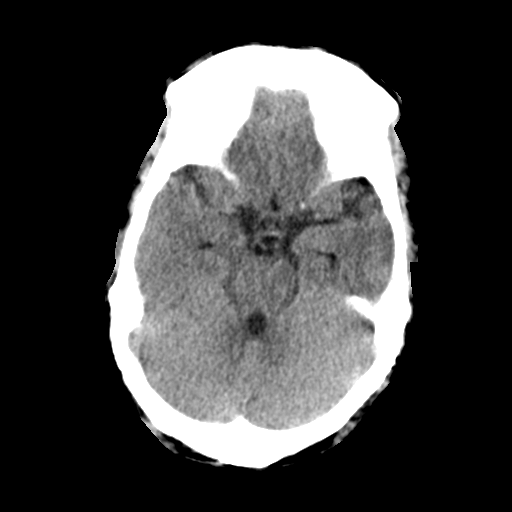
[im 11/29  brain]
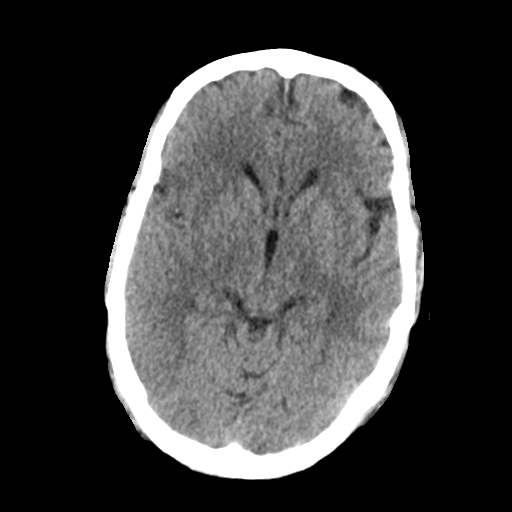
[im 14/29  brain]
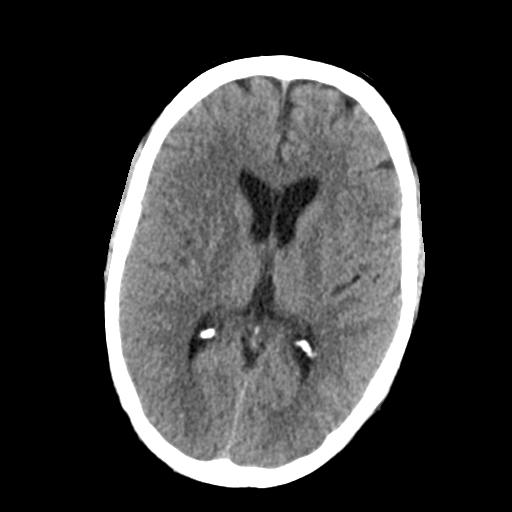
[im 14/29  bone]
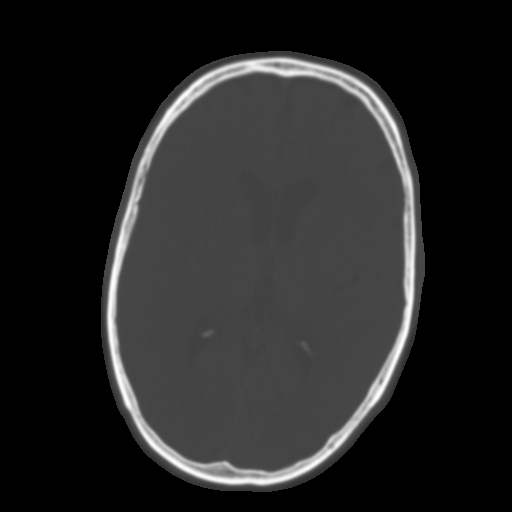
[im 16/29  brain]
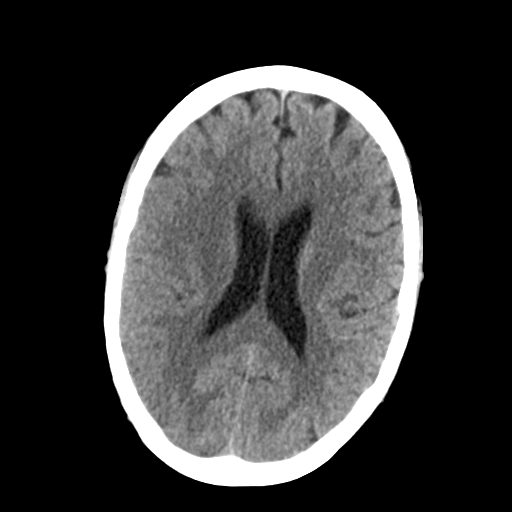
[im 19/29  brain]
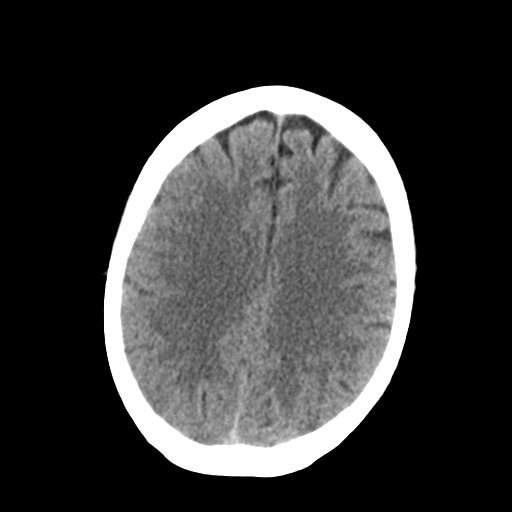
[im 22/29  brain]
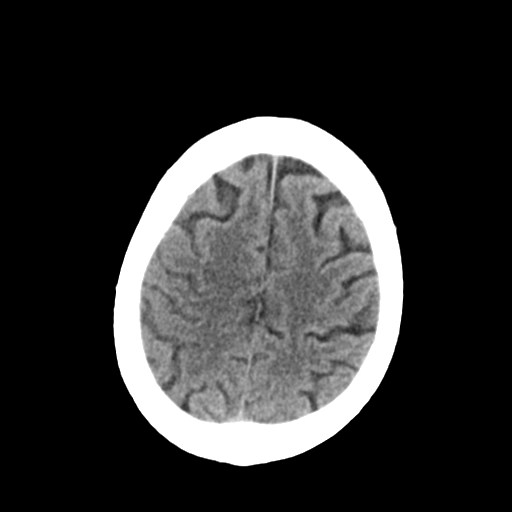
[im 24/29  brain]
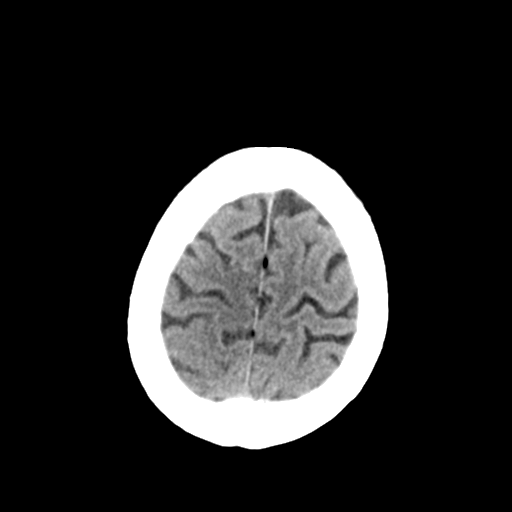
[im 24/29  bone]
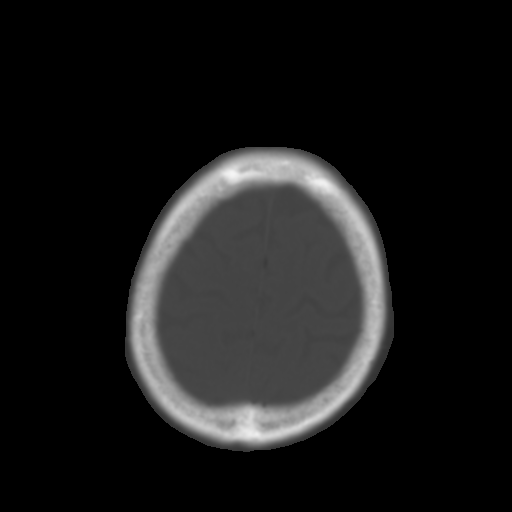
[im 27/29  brain]
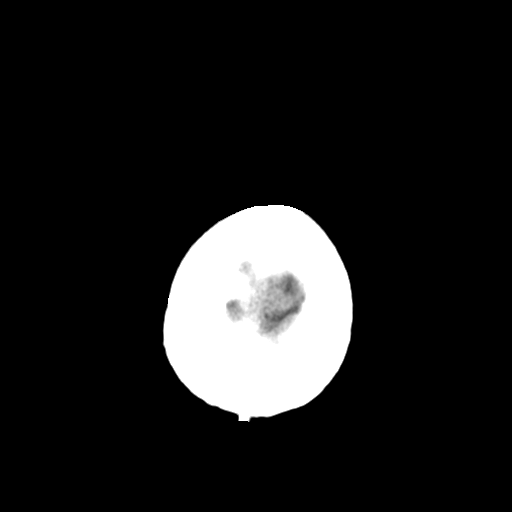

[Series 4: coronal soft tissue · coronal · 0.29mm/px · 3 of 61 slices shown]
[im 21/61  brain]
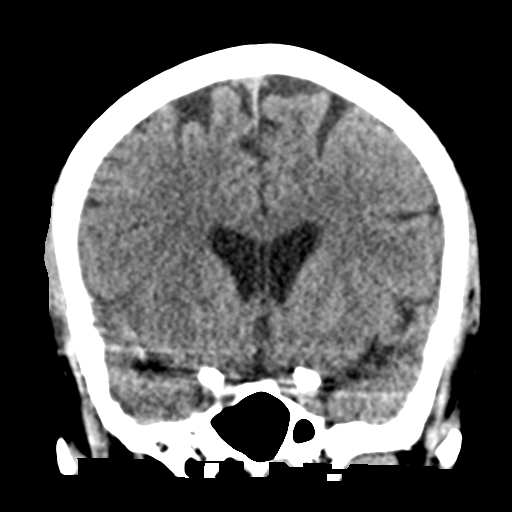
[im 27/61  brain]
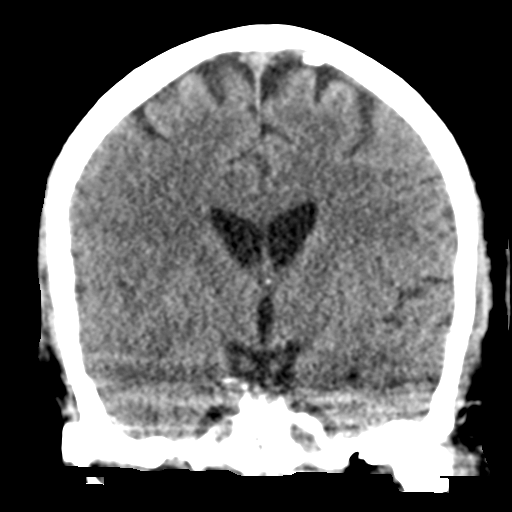
[im 34/61  brain]
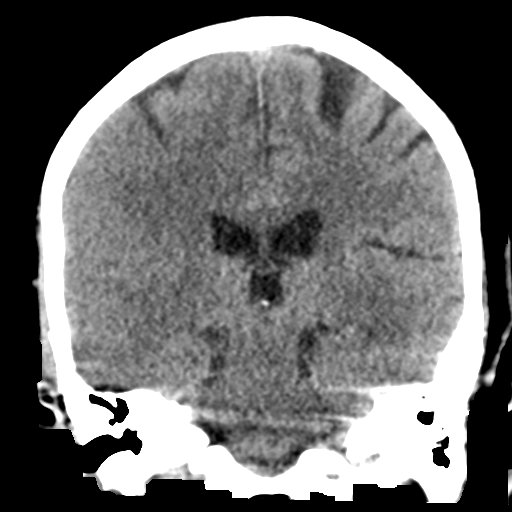

[Series 5: sagittal soft tissue · sagittal · 0.29mm/px · 3 of 49 slices shown]
[im 17/49  brain]
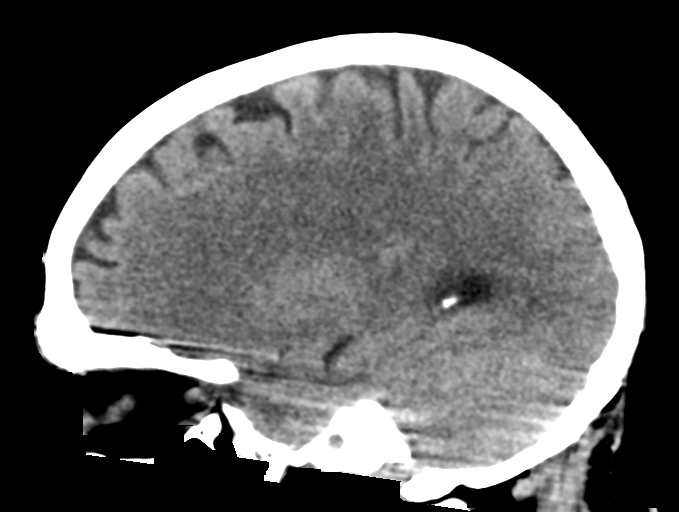
[im 25/49  brain]
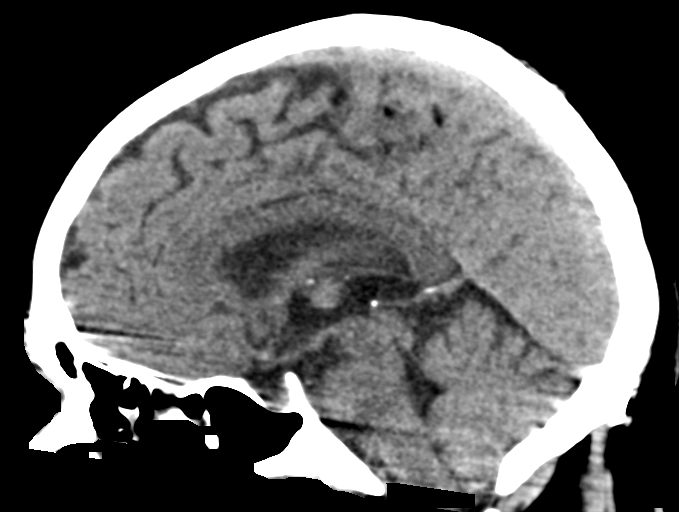
[im 33/49  brain]
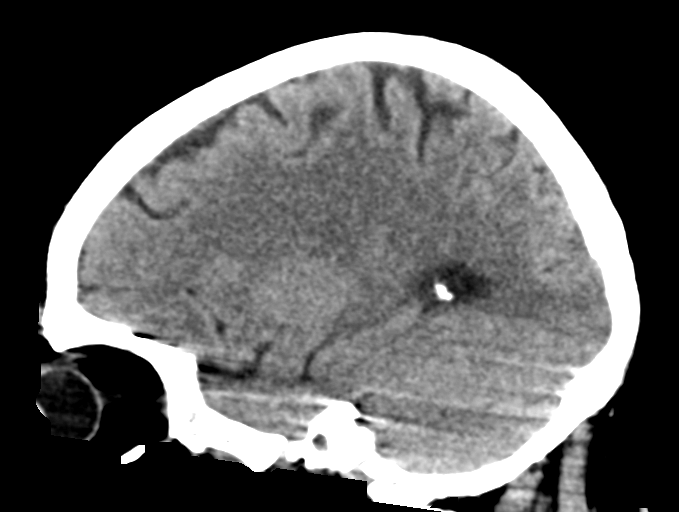

[16 of 46 positions shown; findings below may reference images not displayed]

FINDINGS: Brain: No evidence of acute infarction, hemorrhage, hydrocephalus,
extra-axial collection or mass lesion/mass effect.

Vascular: No hyperdense vessel or unexpected calcification.

Skull: Normal. Negative for fracture or focal lesion.

Sinuses/Orbits: Underpneumatized frontal sinuses. Opacification
rudimentary right frontal sinus. Chronic right lamina papyracea
fracture. Normal aeration of mastoid air cells. Visualized orbits
are normal.

Other: None.
IMPRESSION: No acute intracranial abnormality identified. Unremarkable CT of the
brain.

By: Maycon Saiyed M.D.

## 2019-04-05 IMAGING — DX DG ABDOMEN 1V
1 series · 2 of 2 positions shown · non-contrast
Comparison: None.

CLINICAL DATA: Orogastric tube placement.

EXAM:
ABDOMEN - 1 VIEW

[Series 1: abdomen kub · 0.14mm/px · 2 of 2 slices shown]
[im 1/2]
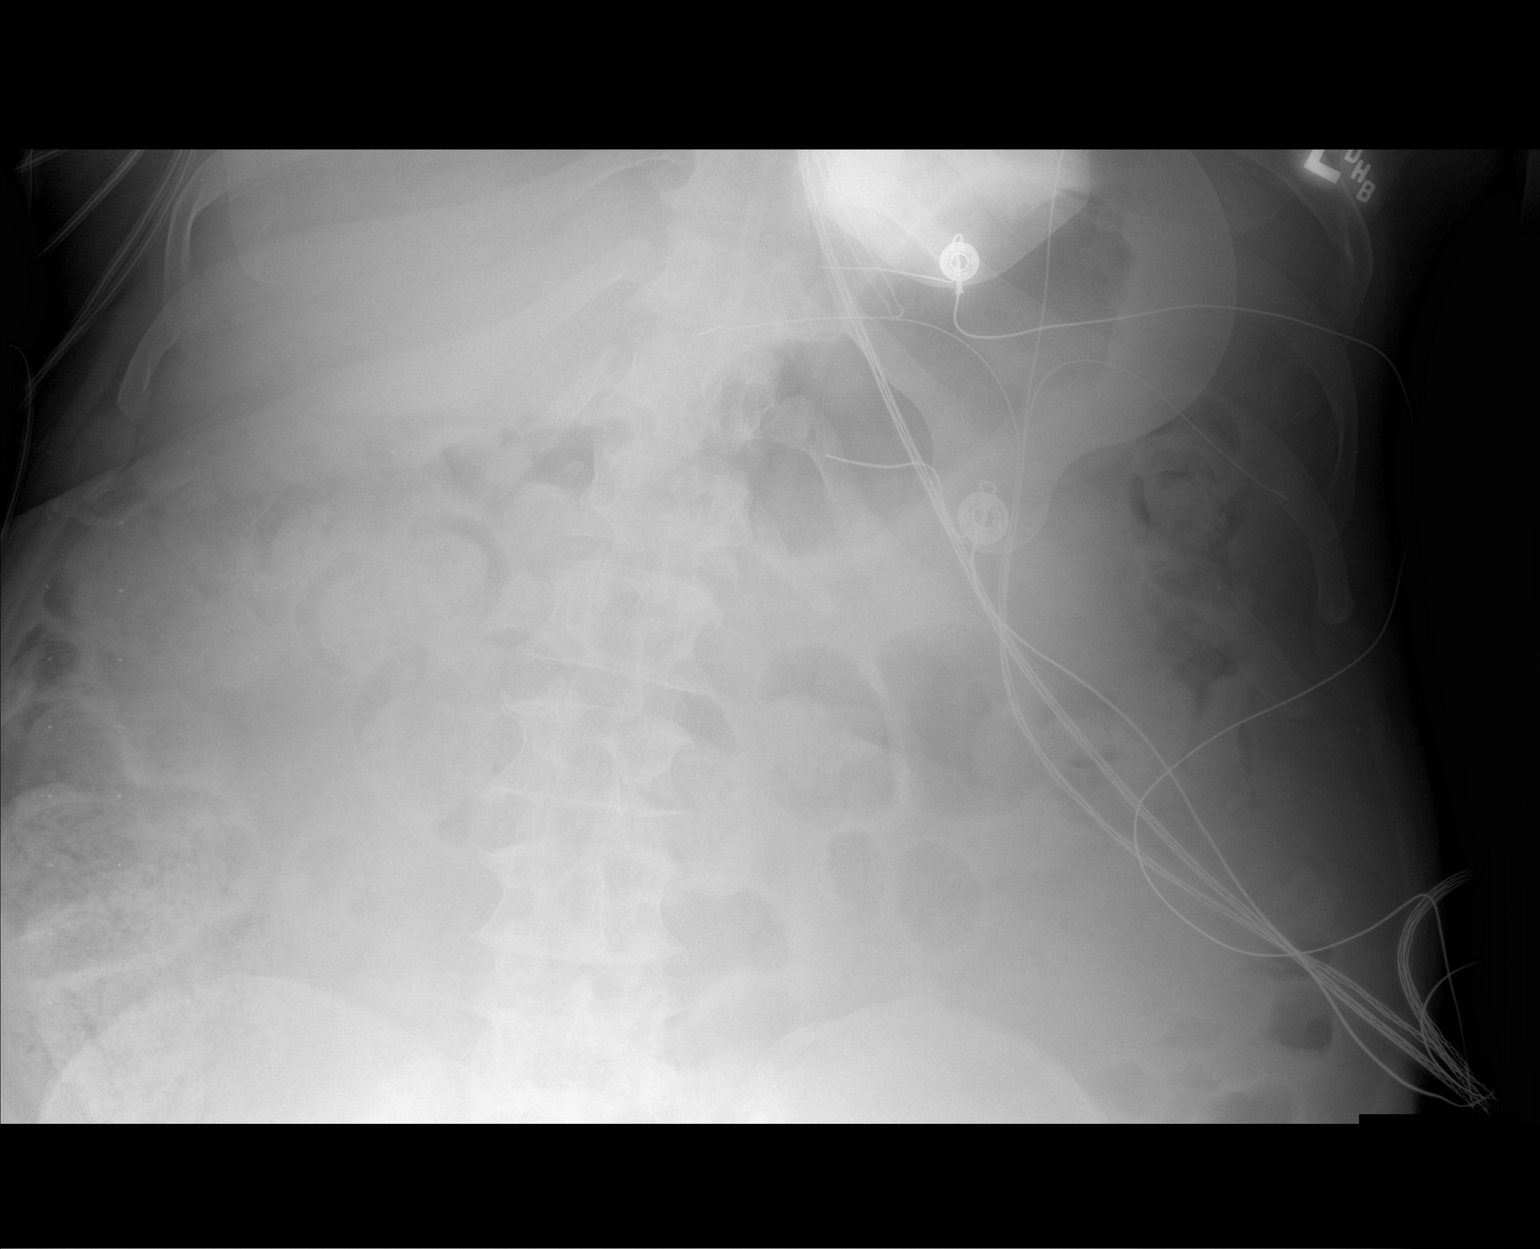
[im 2/2]
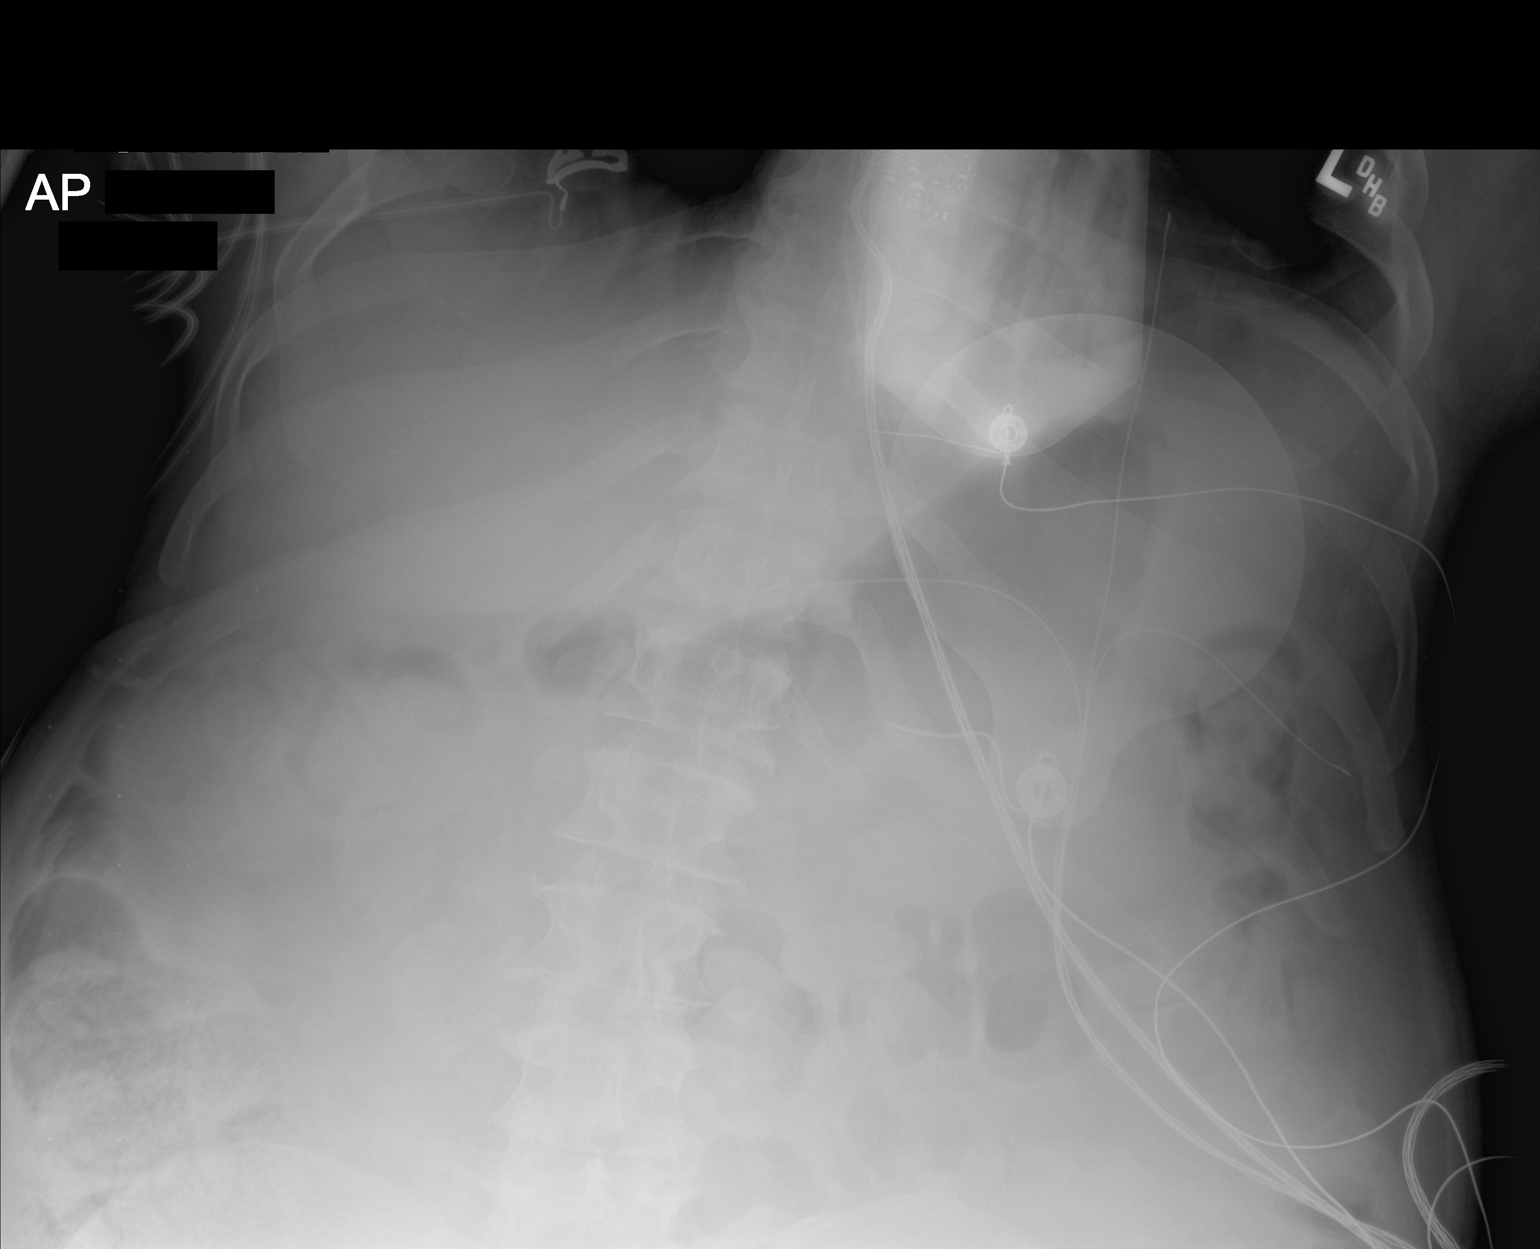

[2 of 2 positions shown; findings below may reference images not displayed]

FINDINGS: The bowel gas pattern is normal. No radio-opaque calculi or other
significant radiographic abnormality are seen. Distal tip of
orogastric tube is seen in expected position of proximal stomach.
IMPRESSION: No evidence of bowel obstruction or ileus. Distal tip of orogastric
tube is seen in expected position of proximal stomach.

## 2019-04-10 ENCOUNTER — Non-Acute Institutional Stay (SKILLED_NURSING_FACILITY): Payer: Medicaid Other | Admitting: Adult Health

## 2019-04-10 ENCOUNTER — Encounter: Payer: Self-pay | Admitting: Adult Health

## 2019-04-10 DIAGNOSIS — I69354 Hemiplegia and hemiparesis following cerebral infarction affecting left non-dominant side: Secondary | ICD-10-CM

## 2019-04-10 DIAGNOSIS — I63411 Cerebral infarction due to embolism of right middle cerebral artery: Secondary | ICD-10-CM

## 2019-04-10 DIAGNOSIS — I480 Paroxysmal atrial fibrillation: Secondary | ICD-10-CM

## 2019-04-10 NOTE — Progress Notes (Signed)
Location:    [facility] Room Number: 117/D Place of Service:  SNF (31)   CODE STATUS:Full Code  Allergies  Allergen Reactions  . Cheese   . Penicillins Hives    Has patient had a PCN reaction causing immediate rash, facial/tongue/throat swelling, SOB or lightheadedness with hypotension: Unknown Has patient had a PCN reaction causing severe rash involving mucus membranes or skin necrosis: Unknown Has patient had a PCN reaction that required hospitalization: No Has patient had a PCN reaction occurring within the last 10 years: No If all of the above answers are "NO", then may proceed with Cephalosporin use.     Chief Complaint  Patient presents with  . Acute Visit    Care Plan Meeting,     HPI:  We have come together for his care plan meeting. BIMS 13/15 mood 0/30. No falls; his weight is stable he can come off prostat. He has a good appetite. He had been having left shoulder pain which is being managed with biofreeze. He is being seen by therapy. There are no reports of anxiety or depressive thoughts.    Past Medical History:  Diagnosis Date  . DM (diabetes mellitus) ([place])   . Hypertension   . Stroke [facility])     Past Surgical History:  Procedure Laterality Date  . CRANIECTOMY Right 04/01/2017   Procedure: RIGHT DECOMPRESSIVE CRANIECTOMY;  Surgeon: Ditty, Kevan Ny, MD;  Location: [facility];  Service: Neurosurgery;  Laterality: Right;  . ESOPHAGOGASTRODUODENOSCOPY N/A 04/13/2017   Procedure: ESOPHAGOGASTRODUODENOSCOPY (EGD);  Surgeon: Georganna Skeans, MD;  Location: [place];  Service: General;  Laterality: N/A;  bedside  . INCISION AND DRAINAGE PERIRECTAL ABSCESS N/A 09/28/2017   Procedure: IRRIGATION AND DEBRIDEMENT PERIANAL ABSCESS;  Surgeon: Virl Cagey, MD;  Location: [facility];  Service: General;  Laterality: N/A;  . PEG PLACEMENT N/A 04/13/2017   Procedure: PERCUTANEOUS ENDOSCOPIC GASTROSTOMY (PEG) PLACEMENT;  Surgeon: Georganna Skeans, MD;   Location: [facility] END[facility]COPY;  Service: General;  Laterality: N/A;    Social History   Socioeconomic History  . Marital status: Unknown    Spouse name: Not on file  . Number of children: Not on file  . Years of education: Not on file  . Highest education level: Not on file  Occupational History  . Not on file  Social Needs  . Financial resource strain: Not on file  . Food insecurity    Worry: Not on file    Inability: Not on file  . Transportation needs    Medical: Not on file    Non-medical: Not on file  Tobacco Use  . Smoking status: Former Smoker    Types: Cigarettes  . Smokeless tobacco: Never Used  . Tobacco comment: UTA  Substance and Sexual Activity  . Alcohol use: No    Comment: UTA  . Drug use: No    Comment: UTA  . Sexual activity: Not Currently    Birth control/protection: None  Lifestyle  . Physical activity    Days per week: Not on file    Minutes per session: Not on file  . Stress: Not on file  Relationships  . Social Herbalist on phone: Not on file    Gets together: Not on file    Attends religious service: Not on file    Active member of club or organization: Not on file    Attends meetings of clubs or organizations: Not on file    Relationship status: Not on  file  . Intimate partner violence    Fear of current or ex partner: Not on file    Emotionally abused: Not on file    Physically abused: Not on file    Forced sexual activity: Not on file  Other Topics Concern  . Not on file  Social History Narrative  . Not on file   History reviewed. No pertinent family history.    VITAL SIGNS BP 109/83   Pulse 73   Temp 98.1 F (36.7 C) (Oral)   Resp 17   Ht 5\' 6"  (1.676 m)   Wt 173 lb 9.6 oz (78.7 kg)   SpO2 95%   BMI 28.02 kg/m   Outpatient Encounter Medications as of 04/10/2019  Medication Sig  . acetaminophen (TYLENOL) 325 MG tablet Take 650 mg by mouth every 4 (four) hours as needed. Not to exceed 3 grams in 24 hours  . Amino  Acids-Protein Hydrolys (FEEDING SUPPLEMENT, PRO-STAT SUGAR FREE 64,) LIQD Take 30 mLs by mouth 2 (two) times daily between meals.   Marland Kitchen apixaban (ELIQUIS) 5 MG TABS tablet Take 5 mg by mouth 2 (two) times daily.   Marland Kitchen atorvastatin (LIPITOR) 20 MG tablet Take 20 mg by mouth daily.   . Baclofen 5 MG TABS Take 5 mg by mouth 3 (three) times daily.  . bisacodyl (DULCOLAX) 10 MG suppository Place 1 suppository (10 mg total) rectally daily as needed for moderate constipation.  . Cholecalciferol 125 MCG (5000 UT) TABS Take 5,000 Units by mouth daily. For vitamin D deficiency  . coal tar (NEUTROGENA T-GEL) 0.5 % shampoo Apply 1 application topically See admin instructions. Apply to scalp on shower days twice a week Wed. And Sat.  . Dextromethorphan-guaiFENesin (ROBITUSSIN COUGH+CHEST CONG DM PO) Take 10 mLs by mouth every 6 (six) hours as needed.  . gabapentin (NEURONTIN) 100 MG capsule Take 200 mg by mouth at bedtime.   . Menthol, Topical Analgesic, (BIOFREEZE) 4 % GEL Apply 1 application topically 2 (two) times daily as needed. Apply to left shoulder  . metoprolol succinate (TOPROL-XL) 25 MG 24 hr tablet Take 12.5 mg by mouth 2 (two) times daily.  . NON FORMULARY Diet type:  Upgrade to Regular Diet and continue thin liquids  . omeprazole (PRILOSEC) 40 MG capsule Take 40 mg by mouth daily.   . polyethylene glycol (MIRALAX / GLYCOLAX) packet Take 17 g by mouth daily.   . [DISCONTINUED] Cholecalciferol 25 MCG (1000 UT) tablet Take 2,000 Units by mouth 2 (two) times daily.    No facility-administered encounter medications on file as of 04/10/2019.      SIGNIFICANT DIAGNOSTIC EXAMS   PREVIOUS:   08-24-18: ct of head: 1. 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 Increased volume loss of the infarcted brain as well as decreased herniation of brain and dura through the craniectomy defect. 2. No new intracranial abnormality.  NO NEW EXAMS.   LABS REVIEWED PREVIOUS:    05-29-18: chol 165; ldl 73; trig 321; hdl 28 hgb a1c 4.7  07-14-18: wbc 8.8; hgb 13.8; hct 47.5; mcv 92.8 plt 156; glucose 75; bun 10; creat 0.86; k+ 3.6; na++ 140 ca 9.2  09-15-18: influenza: neg 10-30-18: hgb a1c 4.3 11-07-18: urine micro-albumin 43.0  11-28-18: glucose 93; bun 16; creat 0.86; k+ 4.4; an++ 140; ca 9.0; liver normal albumin 3.1; chol 146; ldl 84; trig 114; hdl 39   TODAY;   03-08-19: wbc 9.7; hgb 12.0; hct 41.6; mcv 88.1 plt 193; glucose 97; bun  15; creat 0.79; k+ 4.2; na++ 138; ca 9.1; liver normal albumin 3.1; tsh 1.925 vit D 22.8   Review of Systems  Constitutional: Negative for malaise/fatigue.  Respiratory: Negative for cough and shortness of breath.   Cardiovascular: Negative for chest pain, palpitations and leg swelling.  Gastrointestinal: Negative for abdominal pain, constipation and heartburn.  Musculoskeletal: Negative for back pain, joint pain and myalgias.  Skin: Negative.   Neurological: Negative for dizziness.  Psychiatric/Behavioral: The patient is not nervous/anxious.     Physical Exam Constitutional:      General: He is not in acute distress.    Appearance: He is well-developed. He is not diaphoretic.  Neck:     Musculoskeletal: Neck supple.     Thyroid: No thyromegaly.  Cardiovascular:     Rate and Rhythm: Normal rate and regular rhythm.     Pulses: Normal pulses.     Heart sounds: Normal heart sounds.  Pulmonary:     Effort: Pulmonary effort is normal. No respiratory distress.     Breath sounds: Normal breath sounds.  Abdominal:     General: Bowel sounds are normal. There is no distension.     Palpations: Abdomen is soft.     Tenderness: There is no abdominal tenderness.  Musculoskeletal:     Right lower leg: No edema.     Left lower leg: No edema.     Comments:  Left hemiplegia with stiffness present    Lymphadenopathy:     Cervical: No cervical adenopathy.  Skin:    General: Skin is warm and dry.  Neurological:     Mental Status: He is  alert and oriented to person, place, and time.     Comments:  History of right decompression craniotomy      Psychiatric:        Mood and Affect: Mood normal.    .    ASSESSMENT/ PLAN:  TODAY;   1. 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 AF ( paroxysmal atrial fibrillation) 3. Hemiparesis affecting left side as late effect of cerebrovascular accident (CVA)   Will continue current medications Will continue current plan of care Will continue to monitor his status.      MD is aware of resident's narcotic use and is in agreement with current plan of care. We will attempt to wean resident as appropriate.  Ok Edwards NP [facility]  Contact [phone] Monday through Friday 8am- 5pm  After hours call [phone]

## 2019-04-19 ENCOUNTER — Encounter: Payer: Self-pay | Admitting: Adult Health

## 2019-04-19 ENCOUNTER — Non-Acute Institutional Stay (SKILLED_NURSING_FACILITY): Payer: Medicaid Other | Admitting: Adult Health

## 2019-04-19 DIAGNOSIS — I69354 Hemiplegia and hemiparesis following cerebral infarction affecting left non-dominant side: Secondary | ICD-10-CM

## 2019-04-19 DIAGNOSIS — E785 Hyperlipidemia, unspecified: Secondary | ICD-10-CM

## 2019-04-19 DIAGNOSIS — E1169 Type 2 diabetes mellitus with other specified complication: Secondary | ICD-10-CM | POA: Diagnosis not present

## 2019-04-19 DIAGNOSIS — K219 Gastro-esophageal reflux disease without esophagitis: Secondary | ICD-10-CM | POA: Diagnosis not present

## 2019-04-23 NOTE — Progress Notes (Signed)
Location:   [facility] Room Number: 117/D Place of Service:  SNF (31)   CODE STATUS: full code   Allergies  Allergen Reactions  . Cheese   . Penicillins Hives    Has patient had a PCN reaction causing immediate rash, facial/tongue/throat swelling, SOB or lightheadedness with hypotension: Unknown Has patient had a PCN reaction causing severe rash involving mucus membranes or skin necrosis: Unknown Has patient had a PCN reaction that required hospitalization: No Has patient had a PCN reaction occurring within the last 10 years: No If all of the above answers are "NO", then may proceed with Cephalosporin use.     Chief Complaint  Patient presents with  . Medical Management of Chronic Issues         Hemiparesis affecting left side as late effect of cerebrovascular accident (CVA)  GERD without esophagitis  Dyslipidemia associated with type 2 diabetes mellitus:    HPI:  He is a 58 year old long term resident of this facility being seen for the management of his chronic illnesses: hemiparesis; gerd; dyslipidemia. He denies any joint pain; denies any uncontrolled pain; no heart burn; no anxiety or depressive thoughts.   Past Medical History:  Diagnosis Date  . DM (diabetes mellitus) ([place])   . Hypertension   . Stroke [facility])     Past Surgical History:  Procedure Laterality Date  . CRANIECTOMY Right 04/01/2017   Procedure: RIGHT DECOMPRESSIVE CRANIECTOMY;  Surgeon: Ditty, Kevan Ny, MD;  Location: [place];  Service: Neurosurgery;  Laterality: Right;  . ESOPHAGOGASTRODUODENOSCOPY N/A 04/13/2017   Procedure: ESOPHAGOGASTRODUODENOSCOPY (EGD);  Surgeon: Georganna Skeans, MD;  Location: [place];  Service: General;  Laterality: N/A;  bedside  . INCISION AND DRAINAGE PERIRECTAL ABSCESS N/A 09/28/2017   Procedure: IRRIGATION AND DEBRIDEMENT PERIANAL ABSCESS;  Surgeon: Virl Cagey, MD;  Location: [facility];  Service: General;  Laterality: N/A;  . PEG PLACEMENT N/A 04/13/2017    Procedure: PERCUTANEOUS ENDOSCOPIC GASTROSTOMY (PEG) PLACEMENT;  Surgeon: Georganna Skeans, MD;  Location: [facility] END[facility]COPY;  Service: General;  Laterality: N/A;    Social History   Socioeconomic History  . Marital status: Unknown    Spouse name: Not on file  . Number of children: Not on file  . Years of education: Not on file  . Highest education level: Not on file  Occupational History  . Not on file  Social Needs  . Financial resource strain: Not on file  . Food insecurity    Worry: Not on file    Inability: Not on file  . Transportation needs    Medical: Not on file    Non-medical: Not on file  Tobacco Use  . Smoking status: Former Smoker    Types: Cigarettes  . Smokeless tobacco: Never Used  . Tobacco comment: UTA  Substance and Sexual Activity  . Alcohol use: No    Comment: UTA  . Drug use: No    Comment: UTA  . Sexual activity: Not Currently    Birth control/protection: None  Lifestyle  . Physical activity    Days per week: Not on file    Minutes per session: Not on file  . Stress: Not on file  Relationships  . Social Herbalist on phone: Not on file    Gets together: Not on file    Attends religious service: Not on file    Active member of club or organization: Not on file    Attends meetings of clubs or  organizations: Not on file    Relationship status: Not on file  . Intimate partner violence    Fear of current or ex partner: Not on file    Emotionally abused: Not on file    Physically abused: Not on file    Forced sexual activity: Not on file  Other Topics Concern  . Not on file  Social History Narrative  . Not on file   History reviewed. No pertinent family history.    VITAL SIGNS BP 130/80   Pulse 71   Temp 98.5 F (36.9 C) (Oral)   Resp 17   Ht 5\' 6"  (1.676 m)   Wt 181 lb (82.1 kg)   SpO2 95%   BMI 29.21 kg/m   Outpatient Encounter Medications as of 04/19/2019  Medication Sig  . acetaminophen (TYLENOL) 325 MG tablet Take  650 mg by mouth every 4 (four) hours as needed. Not to exceed 3 grams in 24 hours  . Amino Acids-Protein Hydrolys (FEEDING SUPPLEMENT, PRO-STAT SUGAR FREE 64,) LIQD Take 30 mLs by mouth 2 (two) times daily between meals.   Marland Kitchen apixaban (ELIQUIS) 5 MG TABS tablet Take 5 mg by mouth 2 (two) times daily.   Marland Kitchen atorvastatin (LIPITOR) 20 MG tablet Take 20 mg by mouth daily.   . Baclofen 5 MG TABS Take 5 mg by mouth 3 (three) times daily.  . bisacodyl (DULCOLAX) 10 MG suppository Place 1 suppository (10 mg total) rectally daily as needed for moderate constipation.  . Cholecalciferol 125 MCG (5000 UT) TABS Take 5,000 Units by mouth daily. For vitamin D deficiency  . coal tar (NEUTROGENA T-GEL) 0.5 % shampoo Apply 1 application topically See admin instructions. Apply to scalp on shower days twice a week Wed. And Sat.  . Dextromethorphan-guaiFENesin (ROBITUSSIN COUGH+CHEST CONG DM PO) Take 10 mLs by mouth every 6 (six) hours as needed.  . gabapentin (NEURONTIN) 100 MG capsule Take 200 mg by mouth at bedtime.   . Menthol, Topical Analgesic, (BIOFREEZE) 4 % GEL Apply 1 application topically 2 (two) times daily as needed. Apply to left shoulder  . metoprolol succinate (TOPROL-XL) 25 MG 24 hr tablet Take 12.5 mg by mouth 2 (two) times daily.  . NON FORMULARY Diet type:  Upgrade to Regular Diet and continue thin liquids  . omeprazole (PRILOSEC) 40 MG capsule Take 40 mg by mouth daily.   . polyethylene glycol (MIRALAX / GLYCOLAX) packet Take 17 g by mouth daily.    No facility-administered encounter medications on file as of 04/19/2019.      SIGNIFICANT DIAGNOSTIC EXAMS    PREVIOUS:   08-24-18: ct of head: 1. 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 Increased volume loss of the infarcted brain as well as decreased herniation of brain and dura through the craniectomy defect. 2. No new intracranial abnormality.  NO NEW EXAMS.   LABS REVIEWED PREVIOUS:   05-29-18: chol  165; ldl 73; trig 321; hdl 28 hgb a1c 4.7  07-14-18: wbc 8.8; hgb 13.8; hct 47.5; mcv 92.8 plt 156; glucose 75; bun 10; creat 0.86; k+ 3.6; na++ 140 ca 9.2  09-15-18: influenza: neg 10-30-18: hgb a1c 4.3 11-07-18: urine micro-albumin 43.0  11-28-18: glucose 93; bun 16; creat 0.86; k+ 4.4; an++ 140; ca 9.0; liver normal albumin 3.1; chol 146; ldl 84; trig 114; hdl 39  03-08-19: wbc 9.7; hgb 12.0; hct 41.6; mcv 88.1 plt 193; glucose 97; bun 15; creat 0.79; k+ 4.2; na++ 138; ca 9.1; liver normal albumin 3.1;  tsh 1.925 vit D 22.8   NO NEW LABS.    Review of Systems  Constitutional: Negative for malaise/fatigue.  Respiratory: Negative for cough and shortness of breath.   Cardiovascular: Negative for chest pain, palpitations and leg swelling.  Gastrointestinal: Negative for abdominal pain, constipation and heartburn.  Musculoskeletal: Negative for back pain, joint pain and myalgias.  Skin: Negative.   Neurological: Negative for dizziness.  Psychiatric/Behavioral: The patient is not nervous/anxious.      Physical Exam Constitutional:      General: He is not in acute distress.    Appearance: He is well-developed. He is not diaphoretic.  Neck:     Musculoskeletal: Neck supple.     Thyroid: No thyromegaly.  Cardiovascular:     Rate and Rhythm: Normal rate and regular rhythm.     Pulses: Normal pulses.     Heart sounds: Normal heart sounds.  Pulmonary:     Effort: Pulmonary effort is normal. No respiratory distress.     Breath sounds: Normal breath sounds.  Abdominal:     General: Bowel sounds are normal. There is no distension.     Palpations: Abdomen is soft.     Tenderness: There is no abdominal tenderness.  Musculoskeletal:     Right lower leg: No edema.     Left lower leg: No edema.     Comments: Left hemiplegia with stiffness present     Lymphadenopathy:     Cervical: No cervical adenopathy.  Skin:    General: Skin is warm and dry.  Neurological:     Mental Status: He is alert  and oriented to person, place, and time.     Comments: History of right decompression craniotomy       Psychiatric:        Mood and Affect: Mood normal.       ASSESSMENT/ PLAN:  TODAY;   1. Hemiparesis affecting left side as late effect of cerebrovascular accident (CVA) is stable will continue baclofen 5 mg twice daily for spasticity   2. GERD without esophagitis: is stable will continue prilosec 40 mg daily   3. Dyslipidemia associated with type 2 diabetes mellitus: is stable LDL 84 will continue lipitor 10 mg daily    PREVIOUS  4. AF (paroxysmal atrial fibrillation) heart rate is stable; will continue toprol xl 12.5 mg twice daily for rate control is on eliquis 5 mg twice daily   5. Hypertension associated with type 2 diabetes mellitus: is stable b/p 130/80  will continue toprol xl 12.5 mg twice daily   6. Type 2 diabetes mellitus with neurological complications: is stable hgb a1c 4.3 will monitor   7. Diabetic peripheral neuropathy: is stable will continue neurontin 200 mg nightly   8. Chronic constipation: is stable will continue colace twice daily as needed and miralax daily as needed  9. Cerebrovascular accident (CVA) due to embolism of right middle cerebral artery: S/P decompressive right frontotemporoparietal craniectomy for decompression with duraplasty on 08/18 is neurologically stable will continue elquis 5 mg twice daily         MD is aware of resident's narcotic use and is in agreement with current plan of care. We will attempt to wean resident as appropriate.  Ok Edwards NP [facility]  Contact [phone] Monday through Friday 8am- 5pm  After hours call [phone]

## 2019-05-17 ENCOUNTER — Encounter: Payer: Self-pay | Admitting: Adult Health

## 2019-05-17 ENCOUNTER — Non-Acute Institutional Stay (SKILLED_NURSING_FACILITY): Payer: Medicaid Other | Admitting: Adult Health

## 2019-05-17 DIAGNOSIS — I152 Hypertension secondary to endocrine disorders: Secondary | ICD-10-CM

## 2019-05-17 DIAGNOSIS — I10 Essential (primary) hypertension: Secondary | ICD-10-CM

## 2019-05-17 DIAGNOSIS — E1149 Type 2 diabetes mellitus with other diabetic neurological complication: Secondary | ICD-10-CM | POA: Diagnosis not present

## 2019-05-17 DIAGNOSIS — E1142 Type 2 diabetes mellitus with diabetic polyneuropathy: Secondary | ICD-10-CM

## 2019-05-17 DIAGNOSIS — I480 Paroxysmal atrial fibrillation: Secondary | ICD-10-CM | POA: Diagnosis not present

## 2019-05-17 DIAGNOSIS — E785 Hyperlipidemia, unspecified: Secondary | ICD-10-CM

## 2019-05-17 DIAGNOSIS — I63411 Cerebral infarction due to embolism of right middle cerebral artery: Secondary | ICD-10-CM

## 2019-05-17 DIAGNOSIS — E1159 Type 2 diabetes mellitus with other circulatory complications: Secondary | ICD-10-CM | POA: Diagnosis not present

## 2019-05-17 DIAGNOSIS — I69354 Hemiplegia and hemiparesis following cerebral infarction affecting left non-dominant side: Secondary | ICD-10-CM

## 2019-05-17 DIAGNOSIS — K5909 Other constipation: Secondary | ICD-10-CM

## 2019-05-17 DIAGNOSIS — K219 Gastro-esophageal reflux disease without esophagitis: Secondary | ICD-10-CM

## 2019-05-17 DIAGNOSIS — E1169 Type 2 diabetes mellitus with other specified complication: Secondary | ICD-10-CM

## 2019-05-17 NOTE — Progress Notes (Signed)
Location:    [place] Room Number: 117/D Place of Service:  SNF (31)   CODE STATUS: Full Code  Allergies  Allergen Reactions  . Cheese   . Penicillins Hives    Has patient had a PCN reaction causing immediate rash, facial/tongue/throat swelling, SOB or lightheadedness with hypotension: Unknown Has patient had a PCN reaction causing severe rash involving mucus membranes or skin necrosis: Unknown Has patient had a PCN reaction that required hospitalization: No Has patient had a PCN reaction occurring within the last 10 years: No If all of the above answers are "NO", then may proceed with Cephalosporin use.     Chief Complaint  Patient presents with  . Annual Exam    Annual Exam    HPI:  He is a 58 year old long term resident of this facility being seen for his comprehensive annual exam. He has not been hospitalized; no recent falls. His weight has been stable. He denies any anxiety or depressive thoughts. He denies any uncontrolled pain. He has a good appetite. He denies any excessive fatigue   Past Medical History:  Diagnosis Date  . DM (diabetes mellitus) ([place])   . Hypertension   . Stroke [facility])     Past Surgical History:  Procedure Laterality Date  . CRANIECTOMY Right 04/01/2017   Procedure: RIGHT DECOMPRESSIVE CRANIECTOMY;  Surgeon: Ditty, Kevan Ny, MD;  Location: [place];  Service: Neurosurgery;  Laterality: Right;  . ESOPHAGOGASTRODUODENOSCOPY N/A 04/13/2017   Procedure: ESOPHAGOGASTRODUODENOSCOPY (EGD);  Surgeon: Georganna Skeans, MD;  Location: [place];  Service: General;  Laterality: N/A;  bedside  . INCISION AND DRAINAGE PERIRECTAL ABSCESS N/A 09/28/2017   Procedure: IRRIGATION AND DEBRIDEMENT PERIANAL ABSCESS;  Surgeon: Virl Cagey, MD;  Location: [facility];  Service: General;  Laterality: N/A;  . PEG PLACEMENT N/A 04/13/2017   Procedure: PERCUTANEOUS ENDOSCOPIC GASTROSTOMY (PEG) PLACEMENT;  Surgeon: Georganna Skeans, MD;  Location:  [facility] ENDOSCOPY;  Service: General;  Laterality: N/A;    Social History   Socioeconomic History  . Marital status: Unknown    Spouse name: Not on file  . Number of children: Not on file  . Years of education: Not on file  . Highest education level: Not on file  Occupational History  . Not on file  Social Needs  . Financial resource strain: Not on file  . Food insecurity    Worry: Not on file    Inability: Not on file  . Transportation needs    Medical: Not on file    Non-medical: Not on file  Tobacco Use  . Smoking status: Former Smoker    Types: Cigarettes  . Smokeless tobacco: Never Used  . Tobacco comment: UTA  Substance and Sexual Activity  . Alcohol use: No    Comment: UTA  . Drug use: No    Comment: UTA  . Sexual activity: Not Currently    Birth control/protection: None  Lifestyle  . Physical activity    Days per week: Not on file    Minutes per session: Not on file  . Stress: Not on file  Relationships  . Social Herbalist on phone: Not on file    Gets together: Not on file    Attends religious service: Not on file    Active member of club or organization: Not on file    Attends meetings of clubs or organizations: Not on file    Relationship status: Not on file  . Intimate partner  violence    Fear of current or ex partner: Not on file    Emotionally abused: Not on file    Physically abused: Not on file    Forced sexual activity: Not on file  Other Topics Concern  . Not on file  Social History Narrative  . Not on file   Family History  Problem Relation Age of Onset  . Hypertension Father      VITAL SIGNS BP 125/70   Pulse 70   Temp 98.6 F (37 C) (Oral)   Resp 17   Ht 5\' 6"  (1.676 m)   Wt 182 lb (82.6 kg)   SpO2 95%   BMI 29.38 kg/m   Outpatient Encounter Medications as of 05/17/2019  Medication Sig  . acetaminophen (TYLENOL) 325 MG tablet Take 650 mg by mouth every 4 (four) hours as needed. Not to exceed 3 grams in 24 hours  .  apixaban (ELIQUIS) 5 MG TABS tablet Take 5 mg by mouth 2 (two) times daily.   Marland Kitchen atorvastatin (LIPITOR) 20 MG tablet Take 20 mg by mouth daily.   . Baclofen 5 MG TABS Take 5 mg by mouth 3 (three) times daily.  . bisacodyl (DULCOLAX) 10 MG suppository Place 1 suppository (10 mg total) rectally daily as needed for moderate constipation.  . Cholecalciferol 125 MCG (5000 UT) TABS Take 5,000 Units by mouth daily. For vitamin D deficiency  . coal tar (NEUTROGENA T-GEL) 0.5 % shampoo Apply 1 application topically See admin instructions. Apply to scalp on shower days twice a week Wed. And Sat.  . Dextromethorphan-guaiFENesin (ROBITUSSIN COUGH+CHEST CONG DM PO) Take 10 mLs by mouth every 6 (six) hours as needed.  . gabapentin (NEURONTIN) 100 MG capsule Take 200 mg by mouth at bedtime.   Marland Kitchen ketoconazole (NIZORAL) 2 % cream Apply 1 application topically. Ringworm area to left hip and upper back until resolved Three Times A Day  . Menthol, Topical Analgesic, (BIOFREEZE) 4 % GEL Apply 1 application topically 2 (two) times daily as needed. Apply to left shoulder  . metoprolol succinate (TOPROL-XL) 25 MG 24 hr tablet Take 12.5 mg by mouth 2 (two) times daily.  . NON FORMULARY Diet type:   Regular Diet  . omeprazole (PRILOSEC) 40 MG capsule Take 40 mg by mouth daily.   . polyethylene glycol (MIRALAX / GLYCOLAX) packet Take 17 g by mouth daily.   . [DISCONTINUED] Amino Acids-Protein Hydrolys (FEEDING SUPPLEMENT, PRO-STAT SUGAR FREE 64,) LIQD Take 30 mLs by mouth 2 (two) times daily between meals.    No facility-administered encounter medications on file as of 05/17/2019.      SIGNIFICANT DIAGNOSTIC EXAMS   PREVIOUS:   08-24-18: ct of head: 1. 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 Increased volume loss of the infarcted brain as well as decreased herniation of brain and dura through the craniectomy defect. 2. No new intracranial abnormality.  NO NEW EXAMS.   LABS  REVIEWED PREVIOUS:   05-29-18: chol 165; ldl 73; trig 321; hdl 28 hgb a1c 4.7  07-14-18: wbc 8.8; hgb 13.8; hct 47.5; mcv 92.8 plt 156; glucose 75; bun 10; creat 0.86; k+ 3.6; na++ 140 ca 9.2  09-15-18: influenza: neg 10-30-18: hgb a1c 4.3 11-07-18: urine micro-albumin 43.0  11-28-18: glucose 93; bun 16; creat 0.86; k+ 4.4; an++ 140; ca 9.0; liver normal albumin 3.1; chol 146; ldl 84; trig 114; hdl 39  03-08-19: wbc 9.7; hgb 12.0; hct 41.6; mcv 88.1 plt 193; glucose 97; bun 15;  creat 0.79; k+ 4.2; na++ 138; ca 9.1; liver normal albumin 3.1; tsh 1.925 vit D 22.8   NO NEW LABS.   Review of Systems  Constitutional: Negative for malaise/fatigue.  Respiratory: Negative for cough and shortness of breath.   Cardiovascular: Negative for chest pain, palpitations and leg swelling.  Gastrointestinal: Negative for abdominal pain, constipation and heartburn.  Musculoskeletal: Negative for back pain, joint pain and myalgias.  Skin: Negative.   Neurological: Negative for dizziness.  Psychiatric/Behavioral: The patient is not nervous/anxious.       Physical Exam Constitutional:      General: He is not in acute distress.    Appearance: He is well-developed. He is not diaphoretic.  HENT:     Right Ear: Tympanic membrane normal.     Left Ear: Tympanic membrane normal.     Nose: Nose normal.     Mouth/Throat:     Mouth: Mucous membranes are moist.     Pharynx: Oropharynx is clear.  Eyes:     Conjunctiva/sclera: Conjunctivae normal.  Neck:     Musculoskeletal: Neck supple.     Thyroid: No thyromegaly.  Cardiovascular:     Rate and Rhythm: Normal rate and regular rhythm.     Pulses: Normal pulses.     Heart sounds: Normal heart sounds.  Pulmonary:     Effort: Pulmonary effort is normal. No respiratory distress.     Breath sounds: Normal breath sounds.  Abdominal:     General: Bowel sounds are normal. There is no distension.     Palpations: Abdomen is soft.     Tenderness: There is no abdominal  tenderness.  Musculoskeletal:     Right lower leg: No edema.     Left lower leg: No edema.     Comments: Left hemiplegia with stiffness present      Lymphadenopathy:     Cervical: No cervical adenopathy.  Skin:    General: Skin is warm and dry.  Neurological:     Mental Status: He is alert and oriented to person, place, and time.     Comments: History of right decompression craniotomy        Psychiatric:        Mood and Affect: Mood normal.   '     ASSESSMENT/ PLAN:  TODAY;   1. AF (paroxysmal atrial fibrillation) heart rate is stable will continue toprol xl 12.5 mg twice daily for rate control is on eliquis 5 mg twice daily   2. Hypertension associated with type 2 diabetes mellitus is stable b/p 125/70 will continue toprol xl 12.5 mg twice daily   3. Type 2 diabetes mellitus with neurological complications: is stable hgb ac 4.3 is diet controlled will monitor  4. Diabetic peripheral neuropathy is stable will continue neurontin 200 mg nightly  5. Chronic constipation: is stable will continue colace twice daily as needed and miralax daily as needed  6. Cerebrovascular accident (CVA) due to embolism of right middle cerebral artery: s/p decompressive right frontotemporoparietal craniectomy for decompression with duraplasty in 2018: is neurologically stable will eliquis 5 mg twice daily   7. Hemiparesis affecting left side as late effect of cerebrovascular accident (CVA) is stable will continue baclofen 5 mg three times daily for spasticity  8. GERD without esophagitis: is stable will continue prilosec 40 mg daily   9. Dyslipidemia associated with type 2 diabetes mellitus: stable LDL 84 will continue lipitor 10 mg daily   Will check cbc; lipids; vit D psa    MD  is aware of resident's narcotic use and is in agreement with current plan of care. We will attempt to wean resident as appropriate.  Ok Edwards NP [facility]  Contact [phone] Monday through  Friday 8am- 5pm  After hours call [phone]

## 2019-05-20 ENCOUNTER — Encounter (HOSPITAL_COMMUNITY)
Admission: RE | Admit: 2019-05-20 | Discharge: 2019-05-20 | Disposition: A | Discharge status: Home / Self Care | Payer: Medicaid Other | Source: Ambulatory Visit | Attending: Internal Medicine | Admitting: Internal Medicine

## 2019-05-20 ENCOUNTER — Encounter: Payer: Self-pay | Admitting: Adult Health

## 2019-05-20 ENCOUNTER — Other Ambulatory Visit: Payer: Self-pay

## 2019-05-20 DIAGNOSIS — I10 Essential (primary) hypertension: Secondary | ICD-10-CM | POA: Insufficient documentation

## 2019-05-20 LAB — LIPID PANEL
Cholesterol: 151 mg/dL (ref 0–200)
HDL: 30 mg/dL — ABNORMAL LOW (ref >40)
LDL Cholesterol: 84 mg/dL (ref 0–99)
Total CHOL/HDL Ratio: 5 RATIO
Triglycerides: 185 mg/dL — ABNORMAL HIGH (ref <150)
VLDL: 37 mg/dL (ref 0–40)

## 2019-05-20 LAB — CBC WITH DIFFERENTIAL/PLATELET
Abs Immature Granulocytes: 0.02 10*3/uL (ref 0.00–0.07)
Basophils Absolute: 0.1 10*3/uL (ref 0.0–0.1)
Basophils Relative: 1 %
Eosinophils Absolute: 0.5 10*3/uL (ref 0.0–0.5)
Eosinophils Relative: 5 %
HCT: 43.6 % (ref 39.0–52.0)
Hemoglobin: 12.6 g/dL — ABNORMAL LOW (ref 13.0–17.0)
Immature Granulocytes: 0 %
Lymphocytes Relative: 35 %
Lymphs Abs: 3.2 10*3/uL (ref 0.7–4.0)
MCH: 25.3 pg — ABNORMAL LOW (ref 26.0–34.0)
MCHC: 28.9 g/dL — ABNORMAL LOW (ref 30.0–36.0)
MCV: 87.6 fL (ref 80.0–100.0)
Monocytes Absolute: 0.8 10*3/uL (ref 0.1–1.0)
Monocytes Relative: 9 %
Neutro Abs: 4.5 10*3/uL (ref 1.7–7.7)
Neutrophils Relative %: 50 %
Platelets: 196 10*3/uL (ref 150–400)
RBC: 4.98 MIL/uL (ref 4.22–5.81)
RDW: 15.4 % (ref 11.5–15.5)
WBC: 9.1 10*3/uL (ref 4.0–10.5)
nRBC: 0 % (ref 0.0–0.2)

## 2019-05-20 LAB — VITAMIN D 25 HYDROXY (VIT D DEFICIENCY, FRACTURES): Vit D, 25-Hydroxy: 24 ng/mL — ABNORMAL LOW (ref 30–100)

## 2019-05-20 LAB — PSA: Prostatic Specific Antigen: 1.6 ng/mL (ref 0.00–4.00)

## 2019-05-20 NOTE — Progress Notes (Signed)
Location:    [place] Room Number: 117/D Place of Service:  SNF (31)   CODE STATUS: Full Code  Allergies  Allergen Reactions  . Cheese   . Penicillins Hives    Has patient had a PCN reaction causing immediate rash, facial/tongue/throat swelling, SOB or lightheadedness with hypotension: Unknown Has patient had a PCN reaction causing severe rash involving mucus membranes or skin necrosis: Unknown Has patient had a PCN reaction that required hospitalization: No Has patient had a PCN reaction occurring within the last 10 years: No If all of the above answers are "NO", then may proceed with Cephalosporin use.     Chief Complaint  Patient presents with  . Acute Visit    Rash    HPI:    Past Medical History:  Diagnosis Date  . DM (diabetes mellitus) ([place])   . Hypertension   . Stroke [facility])     Past Surgical History:  Procedure Laterality Date  . CRANIECTOMY Right 04/01/2017   Procedure: RIGHT DECOMPRESSIVE CRANIECTOMY;  Surgeon: Ditty, Kevan Ny, MD;  Location: [place];  Service: Neurosurgery;  Laterality: Right;  . ESOPHAGOGASTRODUODENOSCOPY N/A 04/13/2017   Procedure: ESOPHAGOGASTRODUODENOSCOPY (EGD);  Surgeon: Georganna Skeans, MD;  Location: [place];  Service: General;  Laterality: N/A;  bedside  . INCISION AND DRAINAGE PERIRECTAL ABSCESS N/A 09/28/2017   Procedure: IRRIGATION AND DEBRIDEMENT PERIANAL ABSCESS;  Surgeon: Virl Cagey, MD;  Location: [facility];  Service: General;  Laterality: N/A;  . PEG PLACEMENT N/A 04/13/2017   Procedure: PERCUTANEOUS ENDOSCOPIC GASTROSTOMY (PEG) PLACEMENT;  Surgeon: Georganna Skeans, MD;  Location: [facility] ENDOSCOPY;  Service: General;  Laterality: N/A;    Social History   Socioeconomic History  . Marital status: Unknown    Spouse name: Not on file  . Number of children: Not on file  . Years of education: Not on file  . Highest education level: Not on file  Occupational History  . Not on file  Social  Needs  . Financial resource strain: Not on file  . Food insecurity    Worry: Not on file    Inability: Not on file  . Transportation needs    Medical: Not on file    Non-medical: Not on file  Tobacco Use  . Smoking status: Former Smoker    Types: Cigarettes  . Smokeless tobacco: Never Used  . Tobacco comment: UTA  Substance and Sexual Activity  . Alcohol use: No    Comment: UTA  . Drug use: No    Comment: UTA  . Sexual activity: Not Currently    Birth control/protection: None  Lifestyle  . Physical activity    Days per week: Not on file    Minutes per session: Not on file  . Stress: Not on file  Relationships  . Social Herbalist on phone: Not on file    Gets together: Not on file    Attends religious service: Not on file    Active member of club or organization: Not on file    Attends meetings of clubs or organizations: Not on file    Relationship status: Not on file  . Intimate partner violence    Fear of current or ex partner: Not on file    Emotionally abused: Not on file    Physically abused: Not on file    Forced sexual activity: Not on file  Other Topics Concern  . Not on file  Social History Narrative  . Not on  file   No family history on file.    VITAL SIGNS BP (!) 103/56   Pulse 77   Temp 98.6 F (37 C) (Oral)   Resp 20   Ht 5\' 6"  (1.676 m)   Wt 182 lb (82.6 kg)   SpO2 95%   BMI 29.38 kg/m   Outpatient Encounter Medications as of 05/20/2019  Medication Sig  . acetaminophen (TYLENOL) 325 MG tablet Take 650 mg by mouth every 4 (four) hours as needed. Not to exceed 3 grams in 24 hours  . apixaban (ELIQUIS) 5 MG TABS tablet Take 5 mg by mouth 2 (two) times daily.   Marland Kitchen atorvastatin (LIPITOR) 20 MG tablet Take 20 mg by mouth daily.   . Baclofen 5 MG TABS Take 5 mg by mouth 3 (three) times daily.  . bisacodyl (DULCOLAX) 10 MG suppository Place 1 suppository (10 mg total) rectally daily as needed for moderate constipation.  .  Cholecalciferol 1.25 MG (50000 UT) capsule Take 50,000 Units by mouth. Once a day on Tuesday  . clotrimazole (LOTRIMIN) 1 % cream Apply 1 application topically 2 (two) times daily. To fungal rash on left hip and left upper back  . coal tar (NEUTROGENA T-GEL) 0.5 % shampoo Apply 1 application topically See admin instructions. Apply to scalp on shower days twice a week Wed. And Sat.  . Dextromethorphan-guaiFENesin (ROBITUSSIN COUGH+CHEST CONG DM PO) Take 10 mLs by mouth every 6 (six) hours as needed.  . gabapentin (NEURONTIN) 100 MG capsule Take 200 mg by mouth at bedtime.   . Menthol, Topical Analgesic, (BIOFREEZE) 4 % GEL Apply 1 application topically 2 (two) times daily as needed. Apply to left shoulder  . metoprolol succinate (TOPROL-XL) 25 MG 24 hr tablet Take 12.5 mg by mouth 2 (two) times daily.  . NON FORMULARY Diet type:   Regular Diet  . omeprazole (PRILOSEC) 40 MG capsule Take 40 mg by mouth daily.   Marland Kitchen senna (SENOKOT) 8.6 MG TABS tablet Take 2 tablets by mouth daily.  . [DISCONTINUED] Cholecalciferol 125 MCG (5000 UT) TABS Take 5,000 Units by mouth daily. For vitamin D deficiency  . [DISCONTINUED] ketoconazole (NIZORAL) 2 % cream Apply 1 application topically. Ringworm area to left hip and upper back until resolved Three Times A Day  . [DISCONTINUED] polyethylene glycol (MIRALAX / GLYCOLAX) packet Take 17 g by mouth daily.    No facility-administered encounter medications on file as of 05/20/2019.      SIGNIFICANT DIAGNOSTIC EXAMS       ASSESSMENT/ PLAN:    MD is aware of resident's narcotic use and is in agreement with current plan of care. We will attempt to wean resident as appropriate.  Ok Edwards NP [facility]  Contact [phone] Monday through Friday 8am- 5pm  After hours call [phone]

## 2019-05-26 ENCOUNTER — Encounter: Payer: Self-pay | Admitting: Adult Health

## 2019-05-27 NOTE — Progress Notes (Signed)
This encounter was created in error - please disregard.

## 2019-05-29 ENCOUNTER — Encounter: Payer: Self-pay | Admitting: Adult Health

## 2019-05-29 ENCOUNTER — Non-Acute Institutional Stay (SKILLED_NURSING_FACILITY): Payer: Medicaid Other | Admitting: Adult Health

## 2019-05-29 ENCOUNTER — Other Ambulatory Visit: Payer: Self-pay | Admitting: Adult Health

## 2019-05-29 DIAGNOSIS — B372 Candidiasis of skin and nail: Secondary | ICD-10-CM | POA: Diagnosis not present

## 2019-05-29 NOTE — Progress Notes (Signed)
Location:  [place] Room Number: 117-D Place of Service:  SNF (31)   CODE STATUS: Full Code  Allergies  Allergen Reactions  . Cheese   . Penicillins Hives    Has patient had a PCN reaction causing immediate rash, facial/tongue/throat swelling, SOB or lightheadedness with hypotension: Unknown Has patient had a PCN reaction causing severe rash involving mucus membranes or skin necrosis: Unknown Has patient had a PCN reaction that required hospitalization: No Has patient had a PCN reaction occurring within the last 10 years: No If all of the above answers are "NO", then may proceed with Cephalosporin use.     Chief Complaint  Patient presents with  . Acute Visit    Patient is seen for a rash    HPI:  He has a rash on his left shoulder and hip area which is fungal in nature. He does have some itching present. He had been treated topically without relief of rash. He denies any uncontrolled pain. There are no reports of fevers.   Past Medical History:  Diagnosis Date  . DM (diabetes mellitus) ([place])   . Hypertension   . Stroke [facility])     Past Surgical History:  Procedure Laterality Date  . CRANIECTOMY Right 04/01/2017   Procedure: RIGHT DECOMPRESSIVE CRANIECTOMY;  Surgeon: Ditty, Kevan Ny, MD;  Location: [place];  Service: Neurosurgery;  Laterality: Right;  . ESOPHAGOGASTRODUODENOSCOPY N/A 04/13/2017   Procedure: ESOPHAGOGASTRODUODENOSCOPY (EGD);  Surgeon: Georganna Skeans, MD;  Location: [place];  Service: General;  Laterality: N/A;  bedside  . INCISION AND DRAINAGE PERIRECTAL ABSCESS N/A 09/28/2017   Procedure: IRRIGATION AND DEBRIDEMENT PERIANAL ABSCESS;  Surgeon: Virl Cagey, MD;  Location: [facility];  Service: General;  Laterality: N/A;  . PEG PLACEMENT N/A 04/13/2017   Procedure: PERCUTANEOUS ENDOSCOPIC GASTROSTOMY (PEG) PLACEMENT;  Surgeon: Georganna Skeans, MD;  Location: [facility] ENDOSCOPY;  Service: General;  Laterality: N/A;    Social  History   Socioeconomic History  . Marital status: Unknown    Spouse name: Not on file  . Number of children: Not on file  . Years of education: Not on file  . Highest education level: Not on file  Occupational History  . Not on file  Social Needs  . Financial resource strain: Not on file  . Food insecurity    Worry: Not on file    Inability: Not on file  . Transportation needs    Medical: Not on file    Non-medical: Not on file  Tobacco Use  . Smoking status: Former Smoker    Types: Cigarettes  . Smokeless tobacco: Never Used  . Tobacco comment: UTA  Substance and Sexual Activity  . Alcohol use: No    Comment: UTA  . Drug use: No    Comment: UTA  . Sexual activity: Not Currently    Birth control/protection: None  Lifestyle  . Physical activity    Days per week: Not on file    Minutes per session: Not on file  . Stress: Not on file  Relationships  . Social Herbalist on phone: Not on file    Gets together: Not on file    Attends religious service: Not on file    Active member of club or organization: Not on file    Attends meetings of clubs or organizations: Not on file    Relationship status: Not on file  . Intimate partner violence    Fear of current or ex partner:  Not on file    Emotionally abused: Not on file    Physically abused: Not on file    Forced sexual activity: Not on file  Other Topics Concern  . Not on file  Social History Narrative  . Not on file   Family History  Problem Relation Age of Onset  . Hypertension Father       VITAL SIGNS BP (!) 103/56   Pulse 77   Temp 98.9 F (37.2 C) (Oral)   Resp 20   Ht 5\' 6"  (1.676 m)   Wt 182 lb (82.6 kg)   SpO2 95%   BMI 29.38 kg/m   Outpatient Encounter Medications as of 05/29/2019  Medication Sig  . acetaminophen (TYLENOL) 325 MG tablet Take 650 mg by mouth every 4 (four) hours as needed. Not to exceed 3 grams in 24 hours  . apixaban (ELIQUIS) 5 MG TABS tablet Take 5 mg by mouth  2 (two) times daily.   Marland Kitchen atorvastatin (LIPITOR) 20 MG tablet Take 20 mg by mouth daily.   . Baclofen 5 MG TABS Take 5 mg by mouth 3 (three) times daily.  . bisacodyl (DULCOLAX) 10 MG suppository Place 1 suppository (10 mg total) rectally daily as needed for moderate constipation.  . Cholecalciferol 1.25 MG (50000 UT) capsule Take 50,000 Units by mouth. Once a day on Tuesday  . clotrimazole (LOTRIMIN) 1 % cream Apply 1 application topically 2 (two) times daily. To fungal rash on left hip and left upper back  . coal tar (NEUTROGENA T-GEL) 0.5 % shampoo Apply 1 application topically See admin instructions. Apply to scalp on shower days twice a week Wed And Sat prn  . Dextromethorphan-guaiFENesin (ROBITUSSIN COUGH+CHEST CONG DM PO) Take 10 mLs by mouth every 6 (six) hours as needed.  . fluconazole (DIFLUCAN) 200 MG tablet Take 200 mg by mouth daily.  Marland Kitchen gabapentin (NEURONTIN) 100 MG capsule Take 200 mg by mouth at bedtime.   . Menthol, Topical Analgesic, (BIOFREEZE) 4 % GEL Apply 1 application topically 2 (two) times daily as needed. Apply to left shoulder  . metoprolol succinate (TOPROL-XL) 25 MG 24 hr tablet Take 12.5 mg by mouth 2 (two) times daily.  . NON FORMULARY Diet type:   Regular Diet  . omeprazole (PRILOSEC) 40 MG capsule Take 40 mg by mouth daily.   Marland Kitchen senna (SENOKOT) 8.6 MG TABS tablet Take 2 tablets by mouth daily.   No facility-administered encounter medications on file as of 05/29/2019.      SIGNIFICANT DIAGNOSTIC EXAMS   PREVIOUS:   08-24-18: ct of head: 1. 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 Increased volume loss of the infarcted brain as well as decreased herniation of brain and dura through the craniectomy defect. 2. No new intracranial abnormality.  NO NEW EXAMS.   LABS REVIEWED PREVIOUS:   05-29-18: chol 165; ldl 73; trig 321; hdl 28 hgb a1c 4.7  07-14-18: wbc 8.8; hgb 13.8; hct 47.5; mcv 92.8 plt 156; glucose 75; bun 10; creat  0.86; k+ 3.6; na++ 140 ca 9.2  09-15-18: influenza: neg 10-30-18: hgb a1c 4.3 11-07-18: urine micro-albumin 43.0  11-28-18: glucose 93; bun 16; creat 0.86; k+ 4.4; an++ 140; ca 9.0; liver normal albumin 3.1; chol 146; ldl 84; trig 114; hdl 39  03-08-19: wbc 9.7; hgb 12.0; hct 41.6; mcv 88.1 plt 193; glucose 97; bun 15; creat 0.79; k+ 4.2; na++ 138; ca 9.1; liver normal albumin 3.1; tsh 1.925 vit D 22.8  TODAY:  05-20-19: wbc 9.1; hgb 12.6; ht 43.6; mcv 87.6 plt 196; chol 151; ldl 84 trig 185; ldl 30; vit D 24.00; PSA 1.60   Review of Systems  Constitutional: Negative for malaise/fatigue.  Respiratory: Negative for cough and shortness of breath.   Cardiovascular: Negative for chest pain, palpitations and leg swelling.  Gastrointestinal: Negative for abdominal pain, constipation and heartburn.  Musculoskeletal: Negative for back pain, joint pain and myalgias.  Skin: Positive for rash.  Neurological: Negative for dizziness.  Psychiatric/Behavioral: The patient is not nervous/anxious.     Physical Exam Constitutional:      General: He is not in acute distress.    Appearance: He is well-developed. He is not diaphoretic.  Neck:     Musculoskeletal: Neck supple.     Thyroid: No thyromegaly.  Cardiovascular:     Rate and Rhythm: Normal rate and regular rhythm.     Pulses: Normal pulses.     Heart sounds: Normal heart sounds.  Pulmonary:     Effort: Pulmonary effort is normal. No respiratory distress.     Breath sounds: Normal breath sounds.  Abdominal:     General: Bowel sounds are normal. There is no distension.     Palpations: Abdomen is soft.     Tenderness: There is no abdominal tenderness.  Musculoskeletal:     Right lower leg: No edema.     Left lower leg: No edema.     Comments: Left hemiplegia with stiffness present   Lymphadenopathy:     Cervical: No cervical adenopathy.  Skin:    General: Skin is warm and dry.     Comments: Has fungal rash on left shoulder and left hip  without open lesions present   Neurological:     Mental Status: He is alert and oriented to person, place, and time.     Comments: History of right decompression craniotomy   Psychiatric:        Mood and Affect: Mood normal.        ASSESSMENT/ PLAN:  TODAY;   1. Fungal skin rash: is worse: will begin diflucan 200 mg daily through 06-05-19    MD is aware of resident's narcotic use and is in agreement with current plan of care. We will attempt to wean resident as appropriate.  Ok Edwards NP [facility] Adult Medicine  Contact [phone] Monday through Friday 8am- 5pm  After hours call [phone]

## 2019-06-03 DIAGNOSIS — B372 Candidiasis of skin and nail: Secondary | ICD-10-CM | POA: Insufficient documentation

## 2019-06-19 ENCOUNTER — Encounter: Payer: Self-pay | Admitting: Adult Health

## 2019-06-19 ENCOUNTER — Non-Acute Institutional Stay (SKILLED_NURSING_FACILITY): Payer: Medicaid Other | Admitting: Internal Medicine

## 2019-06-19 DIAGNOSIS — E1149 Type 2 diabetes mellitus with other diabetic neurological complication: Secondary | ICD-10-CM

## 2019-06-19 DIAGNOSIS — E1142 Type 2 diabetes mellitus with diabetic polyneuropathy: Secondary | ICD-10-CM | POA: Diagnosis not present

## 2019-06-19 DIAGNOSIS — K5909 Other constipation: Secondary | ICD-10-CM | POA: Diagnosis not present

## 2019-06-19 NOTE — Progress Notes (Deleted)
Location:  [place] Room Number: 117-D Place of Service:  SNF (31)  Hennie Duos, MD  Patient Care Team: Hennie Duos, MD as PCP - General (Internal Medicine) Center, [place] ([place]) [name], Phylis Bougie, NP as Nurse Practitioner (Geriatric Medicine)  Extended Emergency Contact Information Primary Emergency Contact: Theresia Bough [place] of [place] Phone: [phone] Relation: Sister Secondary Emergency Contact: Nickie Retort [place] of [place] Mobile Phone: [phone] Relation: Aunt    Allergies: Cheese and Penicillins  Chief Complaint  Patient presents with  . Medical Management of Chronic Issues    Routine [place] visit  . Quality Metric Gaps    Needs A1c    HPI: Patient is a 58 y.o. male who   Past Medical History:  Diagnosis Date  . DM (diabetes mellitus) ([place])   . Hypertension   . Stroke [facility])     Past Surgical History:  Procedure Laterality Date  . CRANIECTOMY Right 04/01/2017   Procedure: RIGHT DECOMPRESSIVE CRANIECTOMY;  Surgeon: Ditty, Kevan Ny, MD;  Location: [place];  Service: Neurosurgery;  Laterality: Right;  . ESOPHAGOGASTRODUODENOSCOPY N/A 04/13/2017   Procedure: ESOPHAGOGASTRODUODENOSCOPY (EGD);  Surgeon: Georganna Skeans, MD;  Location: [place];  Service: General;  Laterality: N/A;  bedside  . INCISION AND DRAINAGE PERIRECTAL ABSCESS N/A 09/28/2017   Procedure: IRRIGATION AND DEBRIDEMENT PERIANAL ABSCESS;  Surgeon: Virl Cagey, MD;  Location: [facility];  Service: General;  Laterality: N/A;  . PEG PLACEMENT N/A 04/13/2017   Procedure: PERCUTANEOUS ENDOSCOPIC GASTROSTOMY (PEG) PLACEMENT;  Surgeon: Georganna Skeans, MD;  Location: [place];  Service: General;  Laterality: N/A;    Allergies as of 06/19/2019      Reactions   Cheese    Penicillins Hives   Has patient had a PCN reaction causing immediate rash, facial/tongue/throat swelling,  SOB or lightheadedness with hypotension: Unknown Has patient had a PCN reaction causing severe rash involving mucus membranes or skin necrosis: Unknown Has patient had a PCN reaction that required hospitalization: No Has patient had a PCN reaction occurring within the last 10 years: No If all of the above answers are "NO", then may proceed with Cephalosporin use.      Medication List    Notice   This visit is during an admission. Changes to the med list made in this visit will be reflected in the After Visit Summary of the admission.     No orders of the defined types were placed in this encounter.   Immunization History  Administered Date(s) Administered  . Influenza-Unspecified 05/13/2019  . Pneumococcal-Unspecified 05/10/2018  . Tdap 01/07/2019    Social History   Tobacco Use  . Smoking status: Former Smoker    Types: Cigarettes  . Smokeless tobacco: Never Used  . Tobacco comment: UTA  Substance Use Topics  . Alcohol use: No    Comment: UTA    Review of Systems  DATA OBTAINED: from patient, nurse, medical record, family member GENERAL:  no fevers, fatigue, appetite changes SKIN: No itching, rash HEENT: No complaint RESPIRATORY: No cough, wheezing, SOB CARDIAC: No chest pain, palpitations, lower extremity edema  GI: No abdominal pain, No N/V/D or constipation, No heartburn or reflux  GU: No dysuria, frequency or urgency, or incontinence  MUSCULOSKELETAL: No unrelieved bone/joint pain NEUROLOGIC: No headache, dizziness  PSYCHIATRIC: No overt anxiety or sadness  Vitals:   06/19/19 1611  BP: (!) 164/94  Pulse: 81  Resp: 20  Temp: 98.8 F (37.1 C)  SpO2: 95%   Body mass index is 28.99 kg/m. Physical Exam  GENERAL APPEARANCE: Alert, conversant, No acute distress  SKIN: No diaphoresis rash HEENT: Unremarkable RESPIRATORY: Breathing is even, unlabored. Lung sounds are clear   CARDIOVASCULAR: Heart RRR no murmurs, rubs or gallops. No peripheral edema   GASTROINTESTINAL: Abdomen is soft, non-tender, not distended w/ normal bowel sounds.  GENITOURINARY: Bladder non tender, not distended  MUSCULOSKELETAL: No abnormal joints or musculature NEUROLOGIC: Cranial nerves 2-12 grossly intact. Moves all extremities PSYCHIATRIC: Mood and affect appropriate to situation, no behavioral issues  Patient Active Problem List   Diagnosis Date Noted  . Skin yeast infection 06/03/2019  . Hypertension associated with type 2 diabetes mellitus ([place]) 01/24/2019  . GERD without esophagitis 11/03/2018  . Dyslipidemia associated with type 2 diabetes mellitus ([place]) 11/03/2018  . Diabetic peripheral neuropathy ([place]) 11/03/2018  . Chronic constipation 11/03/2018  . Dandruff 11/03/2018  . Benign essential HTN   . Hemiparesis affecting left side as late effect of cerebrovascular accident (CVA) ([place])   . Vitamin D deficiency 09/13/2017  . 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  . Type 2 diabetes mellitus with neurological complications ([place])   . AF (paroxysmal atrial fibrillation) ([place]) 04/27/2017  . Cerebrovascular accident (CVA) due to embolism of right middle cerebral artery ([place]) 04/01/2017  . Cytotoxic cerebral edema (HCC) 04/01/2017    CMP     Component Value Date/Time   NA 138 03/08/2019 0930   K 4.2 03/08/2019 0930   CL 109 03/08/2019 0930   CO2 23 03/08/2019 0930   GLUCOSE 97 03/08/2019 0930   BUN 15 03/08/2019 0930   CREATININE 0.79 03/08/2019 0930   CALCIUM 9.1 03/08/2019 0930   CALCIUM 11.5 (H) 09/05/2017 0709   PROT 6.6 03/08/2019 0930   ALBUMIN 3.1 (L) 03/08/2019 0930   AST 10 (L) 03/08/2019 0930   ALT 17 03/08/2019 0930   ALKPHOS 64 03/08/2019 0930   BILITOT 0.4 03/08/2019 0930   GFRNONAA >60 03/08/2019 0930   GFRAA >60 03/08/2019 0930   Recent Labs    07/14/18 0415 11/28/18 0657 03/08/19 0930  NA 140 140 138  K 3.6 4.4 4.2  CL 110 108 109  CO2 26 27 23   GLUCOSE 75 93 97  BUN 10 16 15   CREATININE 0.86 0.86 0.79  CALCIUM 9.2 9.1  9.1   Recent Labs    11/28/18 0657 03/08/19 0930  AST 9* 10*  ALT 16 17  ALKPHOS 57 64  BILITOT 0.3 0.4  PROT 6.8 6.6  ALBUMIN 3.1* 3.1*   Recent Labs    07/14/18 0415 03/08/19 0930 05/20/19 0630  WBC 8.8 9.7 9.1  NEUTROABS 4.6 4.8 4.5  HGB 13.8 12.0* 12.6*  HCT 47.5 41.6 43.6  MCV 92.8 88.1 87.6  PLT 156 193 196   Recent Labs    11/28/18 0700 05/20/19 0630  CHOL 146 151  LDLCALC 84 84  TRIG 114 185*   Lab Results  Component Value Date   MICROALBUR 43.0 (H) 11/07/2018   Lab Results  Component Value Date   TSH 1.925 03/08/2019   Lab Results  Component Value Date   HGBA1C 4.3 (L) 10/30/2018   Lab Results  Component Value Date   CHOL 151 05/20/2019   HDL 30 (L) 05/20/2019   LDLCALC 84 05/20/2019   TRIG 185 (H) 05/20/2019   CHOLHDL 5.0 05/20/2019    Significant Diagnostic Results in last 30 days:  No results found.  Assessment and Plan  No problem-specific Assessment & Plan  notes found for this encounter.   Labs/tests ordered:    Hennie Duos, MD

## 2019-06-23 NOTE — Progress Notes (Signed)
Location:  [place] Room Number: 117-D Place of Service:  SNF (31)  Joe Duos, MD  Patient Care Team: Joe Duos, MD as PCP - General (Internal Medicine) Center, [place] ([place]) Joe Wilkinson, Phylis Bougie, NP as Nurse Practitioner (Geriatric Medicine)  Extended Emergency Contact Information Primary Emergency Contact: Joe Wilkinson States of [place] Phone: [phone] Relation: Sister Secondary Emergency Contact: Joe Wilkinson [place] of [place] Mobile Phone: [phone] Relation: Aunt    Allergies: Cheese and Penicillins  Chief Complaint  Patient presents with  . Medical Management of Chronic Issues    Routine [place] visit  . Quality Metric Gaps    Needs A1c    HPI: Patient is a 58 y.o. male who is being seen for routine issues of chronic constipation, diabetic neuropathy, and hyperlipidemia associated with diabetes.  Past Medical History:  Diagnosis Date  . DM (diabetes mellitus) ([place])   . Hypertension   . Stroke [facility])     Past Surgical History:  Procedure Laterality Date  . CRANIECTOMY Right 04/01/2017   Procedure: RIGHT DECOMPRESSIVE CRANIECTOMY;  Surgeon: Ditty, Kevan Ny, MD;  Location: [place];  Service: Neurosurgery;  Laterality: Right;  . ESOPHAGOGASTRODUODENOSCOPY N/A 04/13/2017   Procedure: ESOPHAGOGASTRODUODENOSCOPY (EGD);  Surgeon: Georganna Skeans, MD;  Location: [place];  Service: General;  Laterality: N/A;  bedside  . INCISION AND DRAINAGE PERIRECTAL ABSCESS N/A 09/28/2017   Procedure: IRRIGATION AND DEBRIDEMENT PERIANAL ABSCESS;  Surgeon: Virl Cagey, MD;  Location: [facility];  Service: General;  Laterality: N/A;  . PEG PLACEMENT N/A 04/13/2017   Procedure: PERCUTANEOUS ENDOSCOPIC GASTROSTOMY (PEG) PLACEMENT;  Surgeon: Georganna Skeans, MD;  Location: [place];  Service: General;  Laterality: N/A;    Allergies as of 06/19/2019      Reactions    Cheese    Penicillins Hives   Has patient had a PCN reaction causing immediate rash, facial/tongue/throat swelling, SOB or lightheadedness with hypotension: Unknown Has patient had a PCN reaction causing severe rash involving mucus membranes or skin necrosis: Unknown Has patient had a PCN reaction that required hospitalization: No Has patient had a PCN reaction occurring within the last 10 years: No If all of the above answers are "NO", then may proceed with Cephalosporin use.      Medication List    Notice   This visit is during an admission. Changes to the med list made in this visit will be reflected in the After Visit Summary of the admission.    Current Outpatient Medications on File Prior to Visit  Medication Sig Dispense Refill  . acetaminophen (TYLENOL) 325 MG tablet Take 650 mg by mouth every 4 (four) hours as needed. Not to exceed 3 grams in 24 hours    . apixaban (ELIQUIS) 5 MG TABS tablet Take 5 mg by mouth 2 (two) times daily.     Marland Kitchen atorvastatin (LIPITOR) 20 MG tablet Take 40 mg by mouth daily.     . Baclofen 5 MG TABS Take 5 mg by mouth 3 (three) times daily. 90 tablet 3  . bisacodyl (DULCOLAX) 10 MG suppository Place 1 suppository (10 mg total) rectally daily as needed for moderate constipation. 12 suppository 0  . Cholecalciferol 1.25 MG (50000 UT) capsule Take 50,000 Units by mouth. Once a day on Tuesday    . coal tar (NEUTROGENA T-GEL) 0.5 % shampoo Apply 1 application topically See admin instructions. Apply to scalp on shower days twice a week Wed And Sat  prn    . Dextromethorphan-guaiFENesin (ROBITUSSIN COUGH+CHEST CONG DM PO) Take 10 mLs by mouth every 6 (six) hours as needed.    . gabapentin (NEURONTIN) 100 MG capsule Take 200 mg by mouth at bedtime.     . Menthol, Topical Analgesic, (BIOFREEZE) 4 % GEL Apply 1 application topically 2 (two) times daily as needed. Apply to left shoulder    . metoprolol succinate (TOPROL-XL) 25 MG 24 hr tablet Take 12.5 mg by mouth 2  (two) times daily.    . NON FORMULARY Diet type:   Regular Diet    . omeprazole (PRILOSEC) 40 MG capsule Take 40 mg by mouth daily.     Marland Kitchen senna (SENOKOT) 8.6 MG TABS tablet Take 2 tablets by mouth daily.     No current facility-administered medications on file prior to visit.     No orders of the defined types were placed in this encounter.   Immunization History  Administered Date(s) Administered  . Influenza-Unspecified 05/13/2019  . Pneumococcal-Unspecified 05/10/2018  . Tdap 01/07/2019    Social History   Tobacco Use  . Smoking status: Former Smoker    Types: Cigarettes  . Smokeless tobacco: Never Used  . Tobacco comment: UTA  Substance Use Topics  . Alcohol use: No    Comment: UTA    Review of Systems  DATA OBTAINED: from patient GENERAL:  no fevers, fatigue, appetite changes SKIN: No itching, rash HEENT: No complaint RESPIRATORY: No cough, wheezing, SOB CARDIAC: No chest pain, palpitations, lower extremity edema  GI: No abdominal pain, No N/V/D or constipation, No heartburn or reflux  GU: No dysuria, frequency or urgency, or incontinence  MUSCULOSKELETAL: No unrelieved bone/joint pain NEUROLOGIC: No headache, dizziness  PSYCHIATRIC: No overt anxiety or sadness  Vitals:   06/19/19 1454  BP: (!) 164/94  Pulse: 81  Temp: 98.8 F (37.1 C)   Body mass index is 28.99 kg/m. Physical Exam  GENERAL APPEARANCE: Alert, conversant, No acute distress  SKIN: No diaphoresis rash HEENT: Unremarkable RESPIRATORY: Breathing is even, unlabored. Lung sounds are clear   CARDIOVASCULAR: Heart RRR no murmurs, rubs or gallops. No peripheral edema  GASTROINTESTINAL: Abdomen is soft, non-tender, not distended w/ normal bowel sounds.  GENITOURINARY: Bladder non tender, not distended  MUSCULOSKELETAL: No abnormal joints or musculature NEUROLOGIC: Cranial nerves 2-12 grossly intact; left hemiplegia PSYCHIATRIC: Mood and affect appropriate to situation, no behavioral issues   Patient Active Problem List   Diagnosis Date Noted  . Skin yeast infection 06/03/2019  . Hypertension associated with type 2 diabetes mellitus ([place]) 01/24/2019  . GERD without esophagitis 11/03/2018  . Dyslipidemia associated with type 2 diabetes mellitus ([place]) 11/03/2018  . Diabetic peripheral neuropathy ([place]) 11/03/2018  . Chronic constipation 11/03/2018  . Dandruff 11/03/2018  . Benign essential HTN   . Hemiparesis affecting left side as late effect of cerebrovascular accident (CVA) ([place])   . Vitamin D deficiency 09/13/2017  . 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  . Type 2 diabetes mellitus with neurological complications ([place])   . AF (paroxysmal atrial fibrillation) ([place]) 04/27/2017  . Cerebrovascular accident (CVA) due to embolism of right middle cerebral artery ([place]) 04/01/2017  . Cytotoxic cerebral edema (HCC) 04/01/2017    CMP     Component Value Date/Time   NA 138 03/08/2019 0930   K 4.2 03/08/2019 0930   CL 109 03/08/2019 0930   CO2 23 03/08/2019 0930   GLUCOSE 97 03/08/2019 0930   BUN 15 03/08/2019 0930   CREATININE 0.79 03/08/2019 0930  CALCIUM 9.1 03/08/2019 0930   CALCIUM 11.5 (H) 09/05/2017 0709   PROT 6.6 03/08/2019 0930   ALBUMIN 3.1 (L) 03/08/2019 0930   AST 10 (L) 03/08/2019 0930   ALT 17 03/08/2019 0930   ALKPHOS 64 03/08/2019 0930   BILITOT 0.4 03/08/2019 0930   GFRNONAA >60 03/08/2019 0930   GFRAA >60 03/08/2019 0930   Recent Labs    07/14/18 0415 11/28/18 0657 03/08/19 0930  NA 140 140 138  K 3.6 4.4 4.2  CL 110 108 109  CO2 26 27 23   GLUCOSE 75 93 97  BUN 10 16 15   CREATININE 0.86 0.86 0.79  CALCIUM 9.2 9.1 9.1   Recent Labs    11/28/18 0657 03/08/19 0930  AST 9* 10*  ALT 16 17  ALKPHOS 57 64  BILITOT 0.3 0.4  PROT 6.8 6.6  ALBUMIN 3.1* 3.1*   Recent Labs    07/14/18 0415 03/08/19 0930 05/20/19 0630  WBC 8.8 9.7 9.1  NEUTROABS 4.6 4.8 4.5  HGB 13.8 12.0* 12.6*  HCT 47.5 41.6 43.6  MCV 92.8 88.1 87.6  PLT 156 193 196    Recent Labs    11/28/18 0700 05/20/19 0630  CHOL 146 151  LDLCALC 84 84  TRIG 114 185*   Lab Results  Component Value Date   MICROALBUR 43.0 (H) 11/07/2018   Lab Results  Component Value Date   TSH 1.925 03/08/2019   Lab Results  Component Value Date   HGBA1C 4.3 (L) 10/30/2018   Lab Results  Component Value Date   CHOL 151 05/20/2019   HDL 30 (L) 05/20/2019   LDLCALC 84 05/20/2019   TRIG 185 (H) 05/20/2019   CHOLHDL 5.0 05/20/2019    Significant Diagnostic Results in last 30 days:  No results found.  Assessment and Plan  Chronic constipation Chronic and stable; continue senna 8.6 mg 2 p.o. daily  Diabetic peripheral neuropathy (HCC) No reported complaints; continue Neurontin 200 mg p.o. nightly  Type 2 diabetes mellitus with neurological complications (HCC) LDL cholesterol 84 on Lipitor 20 mg daily; will increase to 40 mg daily,     Joe Duos, MD

## 2019-06-23 NOTE — Progress Notes (Signed)
This encounter was created in error - please disregard.

## 2019-06-25 ENCOUNTER — Non-Acute Institutional Stay (SKILLED_NURSING_FACILITY): Payer: Medicaid Other | Admitting: Adult Health

## 2019-06-25 ENCOUNTER — Encounter: Payer: Self-pay | Admitting: Adult Health

## 2019-06-25 DIAGNOSIS — K5909 Other constipation: Secondary | ICD-10-CM

## 2019-06-25 DIAGNOSIS — B372 Candidiasis of skin and nail: Secondary | ICD-10-CM | POA: Diagnosis not present

## 2019-06-25 NOTE — Progress Notes (Signed)
Location:    [place] Room Number: 117/D Place of Service:  SNF (31)   CODE STATUS: Full Code  Allergies  Allergen Reactions  . Cheese   . Penicillins Hives    Has patient had a PCN reaction causing immediate rash, facial/tongue/throat swelling, SOB or lightheadedness with hypotension: Unknown Has patient had a PCN reaction causing severe rash involving mucus membranes or skin necrosis: Unknown Has patient had a PCN reaction that required hospitalization: No Has patient had a PCN reaction occurring within the last 10 years: No If all of the above answers are "NO", then may proceed with Cephalosporin use.     Chief Complaint  Patient presents with  . Acute Visit    Rash    HPI:  He has a fungal rash on his left upper back and left buttock. The nursing staff is concerned that it is getting worse. He does have some itching present. He is complaining of constipation. There are no reports of fevers present. He has been treated with diflucan and topical treatments.   Past Medical History:  Diagnosis Date  . DM (diabetes mellitus) ([place])   . Hypertension   . Stroke [facility])     Past Surgical History:  Procedure Laterality Date  . CRANIECTOMY Right 04/01/2017   Procedure: RIGHT DECOMPRESSIVE CRANIECTOMY;  Surgeon: Ditty, Kevan Ny, MD;  Location: [place];  Service: Neurosurgery;  Laterality: Right;  . ESOPHAGOGASTRODUODENOSCOPY N/A 04/13/2017   Procedure: ESOPHAGOGASTRODUODENOSCOPY (EGD);  Surgeon: Georganna Skeans, MD;  Location: [place];  Service: General;  Laterality: N/A;  bedside  . INCISION AND DRAINAGE PERIRECTAL ABSCESS N/A 09/28/2017   Procedure: IRRIGATION AND DEBRIDEMENT PERIANAL ABSCESS;  Surgeon: Virl Cagey, MD;  Location: [facility];  Service: General;  Laterality: N/A;  . PEG PLACEMENT N/A 04/13/2017   Procedure: PERCUTANEOUS ENDOSCOPIC GASTROSTOMY (PEG) PLACEMENT;  Surgeon: Georganna Skeans, MD;  Location: [facility] ENDOSCOPY;  Service: General;   Laterality: N/A;    Social History   Socioeconomic History  . Marital status: Unknown    Spouse name: Not on file  . Number of children: Not on file  . Years of education: Not on file  . Highest education level: Not on file  Occupational History  . Not on file  Social Needs  . Financial resource strain: Not on file  . Food insecurity    Worry: Not on file    Inability: Not on file  . Transportation needs    Medical: Not on file    Non-medical: Not on file  Tobacco Use  . Smoking status: Former Smoker    Types: Cigarettes  . Smokeless tobacco: Never Used  . Tobacco comment: UTA  Substance and Sexual Activity  . Alcohol use: No    Comment: UTA  . Drug use: No    Comment: UTA  . Sexual activity: Not Currently    Birth control/protection: None  Lifestyle  . Physical activity    Days per week: Not on file    Minutes per session: Not on file  . Stress: Not on file  Relationships  . Social Herbalist on phone: Not on file    Gets together: Not on file    Attends religious service: Not on file    Active member of club or organization: Not on file    Attends meetings of clubs or organizations: Not on file    Relationship status: Not on file  . Intimate partner violence    Fear  of current or ex partner: Not on file    Emotionally abused: Not on file    Physically abused: Not on file    Forced sexual activity: Not on file  Other Topics Concern  . Not on file  Social History Narrative  . Not on file   Family History  Problem Relation Age of Onset  . Hypertension Father       VITAL SIGNS BP 130/62   Pulse 85   Temp 98.6 F (37 C) (Oral)   Resp 20   Ht 5\' 6"  (1.676 m)   Wt 179 lb 9.6 oz (81.5 kg)   SpO2 95%   BMI 28.99 kg/m   Outpatient Encounter Medications as of 06/25/2019  Medication Sig  . acetaminophen (TYLENOL) 325 MG tablet Take 650 mg by mouth every 4 (four) hours as needed. Not to exceed 3 grams in 24 hours  . apixaban (ELIQUIS) 5 MG  TABS tablet Take 5 mg by mouth 2 (two) times daily.   Marland Kitchen atorvastatin (LIPITOR) 20 MG tablet Take 40 mg by mouth daily.   . Baclofen 5 MG TABS Take 5 mg by mouth 3 (three) times daily.  . bisacodyl (DULCOLAX) 10 MG suppository Place 1 suppository (10 mg total) rectally daily as needed for moderate constipation.  . Cholecalciferol 1.25 MG (50000 UT) capsule Take 50,000 Units by mouth. Once a day on Tuesday  . coal tar (NEUTROGENA T-GEL) 0.5 % shampoo Apply 1 application topically See admin instructions. Apply to scalp on shower days twice a week Wed And Sat prn  . Dextromethorphan-guaiFENesin (ROBITUSSIN COUGH+CHEST CONG DM PO) Take 10 mLs by mouth every 6 (six) hours as needed.  . gabapentin (NEURONTIN) 100 MG capsule Take 200 mg by mouth at bedtime.   . Menthol, Topical Analgesic, (BIOFREEZE) 4 % GEL Apply 1 application topically 2 (two) times daily as needed. Apply to left shoulder  . metoprolol succinate (TOPROL-XL) 25 MG 24 hr tablet Take 12.5 mg by mouth 2 (two) times daily.  . NON FORMULARY Diet type:   Regular Diet  . omeprazole (PRILOSEC) 40 MG capsule Take 40 mg by mouth daily.   Marland Kitchen senna (SENOKOT) 8.6 MG TABS tablet Take 2 tablets by mouth daily.   No facility-administered encounter medications on file as of 06/25/2019.      SIGNIFICANT DIAGNOSTIC EXAMS   PREVIOUS:   08-24-18: ct of head: 1. 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 Increased volume loss of the infarcted brain as well as decreased herniation of brain and dura through the craniectomy defect. 2. No new intracranial abnormality.  NO NEW EXAMS.   LABS REVIEWED PREVIOUS:   05-29-18: chol 165; ldl 73; trig 321; hdl 28 hgb a1c 4.7  07-14-18: wbc 8.8; hgb 13.8; hct 47.5; mcv 92.8 plt 156; glucose 75; bun 10; creat 0.86; k+ 3.6; na++ 140 ca 9.2  09-15-18: influenza: neg 10-30-18: hgb a1c 4.3 11-07-18: urine micro-albumin 43.0  11-28-18: glucose 93; bun 16; creat 0.86; k+ 4.4; an++ 140; ca  9.0; liver normal albumin 3.1; chol 146; ldl 84; trig 114; hdl 39  03-08-19: wbc 9.7; hgb 12.0; hct 41.6; mcv 88.1 plt 193; glucose 97; bun 15; creat 0.79; k+ 4.2; na++ 138; ca 9.1; liver normal albumin 3.1; tsh 1.925 vit D 22.8 05-20-19: wbc 9.1; hgb 12.6; ht 43.6; mcv 87.6 plt 196; chol 151; ldl 84 trig 185; ldl 30; vit D 24.00; PSA 1.60  NO NEW LABS.    Review of Systems  Constitutional: Negative for malaise/fatigue.  Respiratory: Negative for cough and shortness of breath.   Cardiovascular: Negative for chest pain, palpitations and leg swelling.  Gastrointestinal: Positive for constipation. Negative for abdominal pain and heartburn.  Musculoskeletal: Negative for back pain, joint pain and myalgias.  Skin: Positive for rash.  Neurological: Negative for dizziness.  Psychiatric/Behavioral: The patient is not nervous/anxious.     Physical Exam Constitutional:      General: He is not in acute distress.    Appearance: He is well-developed. He is not diaphoretic.  Neck:     Musculoskeletal: Neck supple.     Thyroid: No thyromegaly.  Cardiovascular:     Rate and Rhythm: Normal rate and regular rhythm.     Pulses: Normal pulses.     Heart sounds: Normal heart sounds.  Pulmonary:     Effort: Pulmonary effort is normal. No respiratory distress.     Breath sounds: Normal breath sounds.  Abdominal:     General: Bowel sounds are normal. There is no distension.     Palpations: Abdomen is soft.     Tenderness: There is no abdominal tenderness.  Musculoskeletal:     Right lower leg: No edema.     Left lower leg: No edema.     Comments: Left hemiplegia with stiffness present    Lymphadenopathy:     Cervical: No cervical adenopathy.  Skin:    General: Skin is warm and dry.     Comments: Has a resolving fungal rash on left shoulder and buttock no open lesions no signs of infection   Neurological:     Mental Status: He is alert and oriented to person, place, and time.     Comments:  History of right decompression craniotomy    Psychiatric:        Mood and Affect: Mood normal.        ASSESSMENT/ PLAN:  TODAY;   1. Chronic constipation: worse 2. Yeast skin infection  Will increase senna to 2 tabs twice daily  Will continue lotrimin to rash twice daily   MD is aware of resident's narcotic use and is in agreement with current plan of care. We will attempt to wean resident as appropriate.  Ok Edwards NP [facility]  Contact [phone] Monday through Friday 8am- 5pm  After hours call [phone]

## 2019-06-30 ENCOUNTER — Encounter: Payer: Self-pay | Admitting: Internal Medicine

## 2019-06-30 NOTE — Assessment & Plan Note (Signed)
No reported complaints; continue Neurontin 200 mg p.o. nightly

## 2019-06-30 NOTE — Assessment & Plan Note (Signed)
LDL cholesterol 84 on Lipitor 20 mg daily; will increase to 40 mg daily,

## 2019-06-30 NOTE — Assessment & Plan Note (Signed)
Chronic and stable; continue senna 8.6 mg 2 p.o. daily

## 2019-07-14 ENCOUNTER — Other Ambulatory Visit (HOSPITAL_COMMUNITY)
Admission: RE | Admit: 2019-07-14 | Discharge: 2019-07-14 | Disposition: A | Discharge status: Home / Self Care | Payer: Medicaid Other | Source: Ambulatory Visit | Attending: Internal Medicine | Admitting: Internal Medicine

## 2019-07-14 ENCOUNTER — Other Ambulatory Visit: Payer: Self-pay | Admitting: Internal Medicine

## 2019-07-14 DIAGNOSIS — Z9189 Other specified personal risk factors, not elsewhere classified: Secondary | ICD-10-CM | POA: Insufficient documentation

## 2019-07-14 DIAGNOSIS — Z20828 Contact with and (suspected) exposure to other viral communicable diseases: Secondary | ICD-10-CM | POA: Insufficient documentation

## 2019-07-17 ENCOUNTER — Other Ambulatory Visit (HOSPITAL_COMMUNITY)
Admission: RE | Admit: 2019-07-17 | Discharge: 2019-07-17 | Disposition: A | Discharge status: Home / Self Care | Payer: Medicaid Other | Source: Skilled Nursing Facility | Attending: Internal Medicine | Admitting: Internal Medicine

## 2019-07-17 DIAGNOSIS — Z20828 Contact with and (suspected) exposure to other viral communicable diseases: Secondary | ICD-10-CM | POA: Diagnosis not present

## 2019-07-17 DIAGNOSIS — R509 Fever, unspecified: Secondary | ICD-10-CM | POA: Insufficient documentation

## 2019-07-17 LAB — RESPIRATORY PANEL BY RT PCR (FLU A&B, COVID)
Influenza A by PCR: NEGATIVE
Influenza B by PCR: NEGATIVE
SARS Coronavirus 2 by RT PCR: NEGATIVE

## 2019-07-17 LAB — SARS CORONAVIRUS 2 (TAT 6-24 HRS): SARS Coronavirus 2: NEGATIVE

## 2019-07-20 ENCOUNTER — Other Ambulatory Visit: Payer: Self-pay | Admitting: Internal Medicine

## 2019-07-20 ENCOUNTER — Other Ambulatory Visit (HOSPITAL_COMMUNITY)
Admission: RE | Admit: 2019-07-20 | Discharge: 2019-07-20 | Disposition: A | Discharge status: Home / Self Care | Payer: Medicaid Other | Source: Ambulatory Visit | Attending: Internal Medicine | Admitting: Internal Medicine

## 2019-07-20 DIAGNOSIS — Z20828 Contact with and (suspected) exposure to other viral communicable diseases: Secondary | ICD-10-CM | POA: Insufficient documentation

## 2019-07-20 DIAGNOSIS — Z9189 Other specified personal risk factors, not elsewhere classified: Secondary | ICD-10-CM

## 2019-07-20 LAB — SARS CORONAVIRUS 2 (TAT 6-24 HRS): SARS Coronavirus 2: NEGATIVE

## 2019-07-23 ENCOUNTER — Non-Acute Institutional Stay (SKILLED_NURSING_FACILITY): Payer: Medicaid Other | Admitting: Adult Health

## 2019-07-23 ENCOUNTER — Encounter: Payer: Self-pay | Admitting: Adult Health

## 2019-07-23 DIAGNOSIS — E1149 Type 2 diabetes mellitus with other diabetic neurological complication: Secondary | ICD-10-CM

## 2019-07-23 DIAGNOSIS — I10 Essential (primary) hypertension: Secondary | ICD-10-CM

## 2019-07-23 DIAGNOSIS — I152 Hypertension secondary to endocrine disorders: Secondary | ICD-10-CM

## 2019-07-23 DIAGNOSIS — I480 Paroxysmal atrial fibrillation: Secondary | ICD-10-CM

## 2019-07-23 DIAGNOSIS — E1159 Type 2 diabetes mellitus with other circulatory complications: Secondary | ICD-10-CM

## 2019-07-23 NOTE — Progress Notes (Signed)
Location:    [place] Room Number: 117/D Place of Service:  SNF (31)   CODE STATUS: Full Code  Allergies  Allergen Reactions  . Cheese   . Penicillins Hives    Has patient had a PCN reaction causing immediate rash, facial/tongue/throat swelling, SOB or lightheadedness with hypotension: Unknown Has patient had a PCN reaction causing severe rash involving mucus membranes or skin necrosis: Unknown Has patient had a PCN reaction that required hospitalization: No Has patient had a PCN reaction occurring within the last 10 years: No If all of the above answers are "NO", then may proceed with Cephalosporin use.     Chief Complaint  Patient presents with  . Medical Management of Chronic Issues       AF (paroxysmal atrial fibrillation)  hypertension associated with type 2 diabetes mellitus: type 2 diabetes mellitus with neurological complications     HPI:  He is a 58 year old short term rehab patient being seen for the management of his chronic illnesses: afib; diabetes hypertension. He denies any uncontrolled pain;no changes in appetite; no anxiety or depressive thoughts.   Past Medical History:  Diagnosis Date  . DM (diabetes mellitus) ([place])   . Hypertension   . Stroke [facility])     Past Surgical History:  Procedure Laterality Date  . CRANIECTOMY Right 04/01/2017   Procedure: RIGHT DECOMPRESSIVE CRANIECTOMY;  Surgeon: Ditty, Kevan Ny, MD;  Location: [place];  Service: Neurosurgery;  Laterality: Right;  . ESOPHAGOGASTRODUODENOSCOPY N/A 04/13/2017   Procedure: ESOPHAGOGASTRODUODENOSCOPY (EGD);  Surgeon: Georganna Skeans, MD;  Location: [place];  Service: General;  Laterality: N/A;  bedside  . INCISION AND DRAINAGE PERIRECTAL ABSCESS N/A 09/28/2017   Procedure: IRRIGATION AND DEBRIDEMENT PERIANAL ABSCESS;  Surgeon: Virl Cagey, MD;  Location: [facility];  Service: General;  Laterality: N/A;  . PEG PLACEMENT N/A 04/13/2017   Procedure: PERCUTANEOUS  ENDOSCOPIC GASTROSTOMY (PEG) PLACEMENT;  Surgeon: Georganna Skeans, MD;  Location: [facility] ENDOSCOPY;  Service: General;  Laterality: N/A;    Social History   Socioeconomic History  . Marital status: Unknown    Spouse name: Not on file  . Number of children: Not on file  . Years of education: Not on file  . Highest education level: Not on file  Occupational History  . Not on file  Tobacco Use  . Smoking status: Former Smoker    Types: Cigarettes  . Smokeless tobacco: Never Used  . Tobacco comment: UTA  Substance and Sexual Activity  . Alcohol use: No    Comment: UTA  . Drug use: No    Comment: UTA  . Sexual activity: Not Currently    Birth control/protection: None  Other Topics Concern  . Not on file  Social History Narrative  . Not on file   Social Determinants of Health   Financial Resource Strain:   . Difficulty of Paying Living Expenses: Not on file  Food Insecurity:   . Worried About Charity fundraiser in the Last Year: Not on file  . Ran Out of Food in the Last Year: Not on file  Transportation Needs:   . Lack of Transportation (Medical): Not on file  . Lack of Transportation (Non-Medical): Not on file  Physical Activity:   . Days of Exercise per Week: Not on file  . Minutes of Exercise per Session: Not on file  Stress:   . Feeling of Stress : Not on file  Social Connections:   . Frequency of Communication with  Friends and Family: Not on file  . Frequency of Social Gatherings with Friends and Family: Not on file  . Attends Religious Services: Not on file  . Active Member of Clubs or Organizations: Not on file  . Attends Archivist Meetings: Not on file  . Marital Status: Not on file  Intimate Partner Violence:   . Fear of Current or Ex-Partner: Not on file  . Emotionally Abused: Not on file  . Physically Abused: Not on file  . Sexually Abused: Not on file   Family History  Problem Relation Age of Onset  . Hypertension Father       VITAL  SIGNS BP 128/72   Pulse 68   Temp 98.4 F (36.9 C) (Oral)   Resp 20   Ht 5\' 6"  (1.676 m)   Wt 177 lb 9.6 oz (80.6 kg)   SpO2 95%   BMI 28.67 kg/m   Outpatient Encounter Medications as of 07/23/2019  Medication Sig  . acetaminophen (TYLENOL) 325 MG tablet Take 650 mg by mouth every 4 (four) hours as needed. Not to exceed 3 grams in 24 hours  . apixaban (ELIQUIS) 5 MG TABS tablet Take 5 mg by mouth 2 (two) times daily.   Marland Kitchen ascorbic acid ([employer] VITAMIN C) 500 MG tablet Take 500 mg by mouth daily.  Marland Kitchen atorvastatin (LIPITOR) 20 MG tablet Take 40 mg by mouth daily.   . Baclofen 5 MG TABS Take 5 mg by mouth 3 (three) times daily.  . bisacodyl (DULCOLAX) 10 MG suppository Place 1 suppository (10 mg total) rectally daily as needed for moderate constipation.  . Cholecalciferol 1.25 MG (50000 UT) capsule Take 50,000 Units by mouth. Once a day on Tuesday  . clotrimazole (LOTRIMIN) 1 % cream Apply 1 application topically 2 (two) times daily. To fungal rash until resolved  . coal tar (NEUTROGENA T-GEL) 0.5 % shampoo Apply 1 application topically See admin instructions. Apply to scalp on shower days twice a week Wed And Sat prn  . Dextromethorphan-guaiFENesin (ROBITUSSIN COUGH+CHEST CONG DM PO) Take 10 mLs by mouth every 6 (six) hours as needed.  . gabapentin (NEURONTIN) 100 MG capsule Take 200 mg by mouth at bedtime.   . Menthol, Topical Analgesic, (BIOFREEZE) 4 % GEL Apply 1 application topically 2 (two) times daily as needed. Apply to left shoulder  . metoprolol succinate (TOPROL-XL) 25 MG 24 hr tablet Take 12.5 mg by mouth 2 (two) times daily.  . NON FORMULARY Diet type:   Regular Diet  . omeprazole (PRILOSEC) 40 MG capsule Take 40 mg by mouth daily.   Marland Kitchen senna (SENOKOT) 8.6 MG TABS tablet Take 2 tablets by mouth 2 (two) times daily.   Marland Kitchen zinc sulfate 220 (50 Zn) MG capsule Take 220 mg by mouth daily.   No facility-administered encounter medications on file as of 07/23/2019.     SIGNIFICANT  DIAGNOSTIC EXAMS   PREVIOUS:   08-24-18: ct of head: 1. 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 Increased volume loss of the infarcted brain as well as decreased herniation of brain and dura through the craniectomy defect. 2. No new intracranial abnormality.  NO NEW EXAMS.   LABS REVIEWED PREVIOUS:   09-15-18: influenza: neg 10-30-18: hgb a1c 4.3 11-07-18: urine micro-albumin 43.0  11-28-18: glucose 93; bun 16; creat 0.86; k+ 4.4; an++ 140; ca 9.0; liver normal albumin 3.1; chol 146; ldl 84; trig 114; hdl 39  03-08-19: wbc 9.7; hgb 12.0; hct 41.6; mcv 88.1 plt  193; glucose 97; bun 15; creat 0.79; k+ 4.2; na++ 138; ca 9.1; liver normal albumin 3.1; tsh 1.925 vit D 22.8  TODAY  05-20-19: wbc 9.1; hgb 12.6; hct 43.6; mcv 87.6 plt 196; chol 151; ldl 84; trig 185; hdl 30 vit D 24.00 PSA 1.60  Review of Systems  Constitutional: Negative for malaise/fatigue.  Respiratory: Negative for cough and shortness of breath.   Cardiovascular: Negative for chest pain, palpitations and leg swelling.  Gastrointestinal: Negative for abdominal pain, constipation and heartburn.  Musculoskeletal: Negative for back pain, joint pain and myalgias.  Skin: Negative.   Neurological: Negative for dizziness.  Psychiatric/Behavioral: The patient is not nervous/anxious.     Physical Exam Constitutional:      General: He is not in acute distress.    Appearance: He is well-developed. He is not diaphoretic.  Neck:     Thyroid: No thyromegaly.  Cardiovascular:     Rate and Rhythm: Normal rate and regular rhythm.     Pulses: Normal pulses.     Heart sounds: Normal heart sounds.  Pulmonary:     Effort: Pulmonary effort is normal. No respiratory distress.     Breath sounds: Normal breath sounds.  Abdominal:     General: Bowel sounds are normal. There is no distension.     Palpations: Abdomen is soft.     Tenderness: There is no abdominal tenderness.  Musculoskeletal:      Cervical back: Neck supple.     Right lower leg: No edema.     Left lower leg: No edema.     Comments: Left hemiplegia with stiffness present     Lymphadenopathy:     Cervical: No cervical adenopathy.  Skin:    General: Skin is warm and dry.  Neurological:     Mental Status: He is alert and oriented to person, place, and time.  Psychiatric:        Mood and Affect: Mood normal.         ASSESSMENT/ PLAN:  TODAY;   1. AF (paroxysmal atrial fibrillation) heart rate is stable will continue eliquis 5 mg twice daily and toprol xl 12.5 mg twice daily for rate control  2. hypertension associated with type 2 diabetes mellitus: is stable b/p 128/72 will continue toprol xl 12.5 mg twice daily   3. Type 2 diabetes mellitus with neurological complications is stable hgb a1c 4.3 diet controlled.   PREVIOUS   4. Diabetic peripheral neuropathy is stable will continue neurontin 200 mg nightly  5. Chronic constipation: is stable will continue colace twice daily as needed and miralax daily as needed  6. Cerebrovascular accident (CVA) due to embolism of right middle cerebral artery: s/p decompressive right frontotemporoparietal craniectomy for decompression with duraplasty in 2018: is neurologically stable  eliquis 5 mg twice daily   7. Hemiparesis affecting left side as late effect of cerebrovascular accident (CVA) is stable will continue baclofen 5 mg three times daily for spasticity  8. GERD without esophagitis: is stable will continue prilosec 40 mg daily   9. Dyslipidemia associated with type 2 diabetes mellitus: stable LDL 84 will continue lipitor 40 mg daily     MD is aware of resident's narcotic use and is in agreement with current plan of care. We will attempt to wean resident as appropriate.  Ok Edwards NP [facility]  Contact [phone] Monday through Friday 8am- 5pm  After hours call [phone]

## 2019-07-24 ENCOUNTER — Other Ambulatory Visit (HOSPITAL_COMMUNITY)
Admission: RE | Admit: 2019-07-24 | Discharge: 2019-07-24 | Disposition: A | Discharge status: Home / Self Care | Payer: Medicaid Other | Source: Ambulatory Visit | Attending: Internal Medicine | Admitting: Internal Medicine

## 2019-07-24 DIAGNOSIS — Z20828 Contact with and (suspected) exposure to other viral communicable diseases: Secondary | ICD-10-CM | POA: Diagnosis present

## 2019-07-26 LAB — NOVEL CORONAVIRUS, NAA (HOSP ORDER, SEND-OUT TO REF LAB; TAT 18-24 HRS): SARS-CoV-2, NAA: NOT DETECTED

## 2019-07-29 ENCOUNTER — Encounter (HOSPITAL_COMMUNITY)
Admission: RE | Admit: 2019-07-29 | Discharge: 2019-07-29 | Disposition: A | Discharge status: Home / Self Care | Payer: Medicaid Other | Source: Ambulatory Visit | Attending: Internal Medicine | Admitting: Internal Medicine

## 2019-07-29 DIAGNOSIS — E1165 Type 2 diabetes mellitus with hyperglycemia: Secondary | ICD-10-CM | POA: Diagnosis present

## 2019-07-29 DIAGNOSIS — I10 Essential (primary) hypertension: Secondary | ICD-10-CM | POA: Diagnosis present

## 2019-07-29 DIAGNOSIS — Z20828 Contact with and (suspected) exposure to other viral communicable diseases: Secondary | ICD-10-CM | POA: Diagnosis not present

## 2019-07-29 LAB — COMPREHENSIVE METABOLIC PANEL
ALT: 27 U/L (ref 0–44)
AST: 16 U/L (ref 15–41)
Albumin: 3.2 g/dL — ABNORMAL LOW (ref 3.5–5.0)
Alkaline Phosphatase: 74 U/L (ref 38–126)
Anion gap: 6 (ref 5–15)
BUN: 11 mg/dL (ref 6–20)
CO2: 28 mmol/L (ref 22–32)
Calcium: 9.2 mg/dL (ref 8.9–10.3)
Chloride: 106 mmol/L (ref 98–111)
Creatinine, Ser: 0.85 mg/dL (ref 0.61–1.24)
GFR calc Af Amer: >60 mL/min (ref >60)
GFR calc non Af Amer: >60 mL/min (ref >60)
Glucose, Bld: 97 mg/dL (ref 70–99)
Potassium: 4.1 mmol/L (ref 3.5–5.1)
Sodium: 140 mmol/L (ref 135–145)
Total Bilirubin: 0.7 mg/dL (ref 0.3–1.2)
Total Protein: 7.2 g/dL (ref 6.5–8.1)

## 2019-07-29 LAB — HEMOGLOBIN A1C
Hgb A1c MFr Bld: 4.6 % — ABNORMAL LOW (ref 4.8–5.6)
Mean Plasma Glucose: 85.32 mg/dL

## 2019-07-31 LAB — NOVEL CORONAVIRUS, NAA (HOSP ORDER, SEND-OUT TO REF LAB; TAT 18-24 HRS): SARS-CoV-2, NAA: NOT DETECTED

## 2019-08-04 DIAGNOSIS — E1165 Type 2 diabetes mellitus with hyperglycemia: Secondary | ICD-10-CM | POA: Diagnosis not present

## 2019-08-05 LAB — NOVEL CORONAVIRUS, NAA (HOSP ORDER, SEND-OUT TO REF LAB; TAT 18-24 HRS): SARS-CoV-2, NAA: NOT DETECTED

## 2019-08-21 ENCOUNTER — Other Ambulatory Visit (HOSPITAL_COMMUNITY)
Admission: RE | Admit: 2019-08-21 | Discharge: 2019-08-21 | Disposition: A | Discharge status: Home / Self Care | Payer: Medicaid Other | Source: Skilled Nursing Facility | Attending: Adult Health | Admitting: Adult Health

## 2019-08-21 DIAGNOSIS — E559 Vitamin D deficiency, unspecified: Secondary | ICD-10-CM | POA: Diagnosis present

## 2019-08-21 LAB — VITAMIN D 25 HYDROXY (VIT D DEFICIENCY, FRACTURES): Vit D, 25-Hydroxy: 24.16 ng/mL — ABNORMAL LOW (ref 30–100)

## 2019-08-28 ENCOUNTER — Encounter: Payer: Self-pay | Admitting: Adult Health

## 2019-08-28 ENCOUNTER — Non-Acute Institutional Stay (SKILLED_NURSING_FACILITY): Payer: Medicaid Other | Admitting: Adult Health

## 2019-08-28 DIAGNOSIS — K5909 Other constipation: Secondary | ICD-10-CM

## 2019-08-28 DIAGNOSIS — E1142 Type 2 diabetes mellitus with diabetic polyneuropathy: Secondary | ICD-10-CM | POA: Diagnosis not present

## 2019-08-28 DIAGNOSIS — I63411 Cerebral infarction due to embolism of right middle cerebral artery: Secondary | ICD-10-CM | POA: Diagnosis not present

## 2019-08-28 NOTE — Progress Notes (Signed)
Location:    [place] Room Number: 117D Place of Service:  SNF (31)  Phillips Grout NP   CODE STATUS: FULL  Allergies  Allergen Reactions  . Cheese   . Penicillins Hives    Has patient had a PCN reaction causing immediate rash, facial/tongue/throat swelling, SOB or lightheadedness with hypotension: Unknown Has patient had a PCN reaction causing severe rash involving mucus membranes or skin necrosis: Unknown Has patient had a PCN reaction that required hospitalization: No Has patient had a PCN reaction occurring within the last 10 years: No If all of the above answers are "NO", then may proceed with Cephalosporin use.     Chief Complaint  Patient presents with  . Medical Management of Chronic Issues        Diabetic peripheral neuropathy:   Chronic constipation  Cerebrovascular accident (CVA) due to embolism of right middle cerebral artery:    HPI:  He is a 59 year old long term resident of this facility being seen for the management of his chronic illnesses: peripheral neuropathy; constipation; cva. There are no reports of uncontrolled ain; no changes in appetite; no insomnia or constipation.    Past Medical History:  Diagnosis Date  . DM (diabetes mellitus) ([place])   . Hypertension   . Stroke [facility])     Past Surgical History:  Procedure Laterality Date  . CRANIECTOMY Right 04/01/2017   Procedure: RIGHT DECOMPRESSIVE CRANIECTOMY;  Surgeon: Ditty, Kevan Ny, MD;  Location: [place];  Service: Neurosurgery;  Laterality: Right;  . ESOPHAGOGASTRODUODENOSCOPY N/A 04/13/2017   Procedure: ESOPHAGOGASTRODUODENOSCOPY (EGD);  Surgeon: Georganna Skeans, MD;  Location: [place];  Service: General;  Laterality: N/A;  bedside  . INCISION AND DRAINAGE PERIRECTAL ABSCESS N/A 09/28/2017   Procedure: IRRIGATION AND DEBRIDEMENT PERIANAL ABSCESS;  Surgeon: Virl Cagey, MD;  Location: [facility];  Service: General;  Laterality: N/A;  . PEG PLACEMENT N/A 04/13/2017   Procedure: PERCUTANEOUS ENDOSCOPIC GASTROSTOMY (PEG) PLACEMENT;  Surgeon: Georganna Skeans, MD;  Location: [facility] ENDOSCOPY;  Service: General;  Laterality: N/A;    Social History   Socioeconomic History  . Marital status: Unknown    Spouse name: Not on file  . Number of children: Not on file  . Years of education: Not on file  . Highest education level: Not on file  Occupational History  . Not on file  Tobacco Use  . Smoking status: Former Smoker    Types: Cigarettes  . Smokeless tobacco: Never Used  . Tobacco comment: UTA  Substance and Sexual Activity  . Alcohol use: No    Comment: UTA  . Drug use: No    Comment: UTA  . Sexual activity: Not Currently    Birth control/protection: None  Other Topics Concern  . Not on file  Social History Narrative  . Not on file   Social Determinants of Health   Financial Resource Strain:   . Difficulty of Paying Living Expenses: Not on file  Food Insecurity:   . Worried About Charity fundraiser in the Last Year: Not on file  . Ran Out of Food in the Last Year: Not on file  Transportation Needs:   . Lack of Transportation (Medical): Not on file  . Lack of Transportation (Non-Medical): Not on file  Physical Activity:   . Days of Exercise per Week: Not on file  . Minutes of Exercise per Session: Not on file  Stress:   . Feeling of Stress : Not on file  Social Connections:   . Frequency of Communication with Friends and Family: Not on file  . Frequency of Social Gatherings with Friends and Family: Not on file  . Attends Religious Services: Not on file  . Active Member of Clubs or Organizations: Not on file  . Attends Archivist Meetings: Not on file  . Marital Status: Not on file  Intimate Partner Violence:   . Fear of Current or Ex-Partner: Not on file  . Emotionally Abused: Not on file  . Physically Abused: Not on file  . Sexually Abused: Not on file   Family History  Problem Relation Age of Onset  . Hypertension  Father       VITAL SIGNS BP 120/78   Pulse 85   Temp 98.4 F (36.9 C) (Oral)   Resp 20   Ht 5\' 6"  (1.676 m)   Wt 180 lb (81.6 kg)   SpO2 95%   BMI 29.05 kg/m   Outpatient Encounter Medications as of 08/28/2019  Medication Sig  . acetaminophen (TYLENOL) 325 MG tablet Take 650 mg by mouth every 4 (four) hours as needed. Not to exceed 3 grams in 24 hours  . apixaban (ELIQUIS) 5 MG TABS tablet Take 5 mg by mouth 2 (two) times daily.   Marland Kitchen atorvastatin (LIPITOR) 40 MG tablet Take 40 mg by mouth daily.  . Baclofen 5 MG TABS Take 5 mg by mouth 3 (three) times daily.  . bisacodyl (DULCOLAX) 10 MG suppository Place 1 suppository (10 mg total) rectally daily as needed for moderate constipation.  . Cholecalciferol 1.25 MG (50000 UT) capsule Take 50,000 Units by mouth. Once a day on mon and thursday  . clotrimazole (LOTRIMIN) 1 % cream Apply 1 application topically 2 (two) times daily. To fungal rash until resolved  . coal tar (NEUTROGENA T-GEL) 0.5 % shampoo Apply 1 application topically See admin instructions. Apply to scalp on shower days twice a week Wed And Sat prn  . Dextromethorphan-guaiFENesin (ROBITUSSIN COUGH+CHEST CONG DM PO) Take 10 mLs by mouth every 6 (six) hours as needed.  . gabapentin (NEURONTIN) 100 MG capsule Take 200 mg by mouth at bedtime.   . Menthol, Topical Analgesic, (BIOFREEZE) 4 % GEL Apply 1 application topically 2 (two) times daily as needed. Apply to left shoulder  . metoprolol succinate (TOPROL-XL) 25 MG 24 hr tablet Take 12.5 mg by mouth 2 (two) times daily.  . NON FORMULARY Diet type:   Regular Diet  . omeprazole (PRILOSEC) 40 MG capsule Take 40 mg by mouth daily.   Marland Kitchen senna (SENOKOT) 8.6 MG TABS tablet Take 2 tablets by mouth 2 (two) times daily.   . [DISCONTINUED] atorvastatin (LIPITOR) 20 MG tablet Take 40 mg by mouth daily.    No facility-administered encounter medications on file as of 08/28/2019.     SIGNIFICANT DIAGNOSTIC EXAMS  PREVIOUS:   08-24-18:  ct of head: 1. 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 Increased volume loss of the infarcted brain as well as decreased herniation of brain and dura through the craniectomy defect. 2. No new intracranial abnormality.  NO NEW EXAMS.   LABS REVIEWED PREVIOUS:   09-15-18: influenza: neg 10-30-18: hgb a1c 4.3 11-07-18: urine micro-albumin 43.0  11-28-18: glucose 93; bun 16; creat 0.86; k+ 4.4; an++ 140; ca 9.0; liver normal albumin 3.1; chol 146; ldl 84; trig 114; hdl 39  03-08-19: wbc 9.7; hgb 12.0; hct 41.6; mcv 88.1 plt 193; glucose 97; bun 15; creat 0.79; k+ 4.2;  na++ 138; ca 9.1; liver normal albumin 3.1; tsh 1.925 vit D 22.8 05-20-19: wbc 9.1; hgb 12.6; hct 43.6; mcv 87.6 plt 196; chol 151; ldl 84; trig 185; hdl 30 vit D 24.00 PSA 1.60  TODAY;   07-29-19: : glucose 97; bun 11; creat 0.85; k+ 4.1; na++ 140; ca 9.2 liver normal albumin 3.2 hgb a1c 4.6 08-21-19: vit D 24.16   Review of Systems  Constitutional: Negative for malaise/fatigue.  Respiratory: Negative for cough and shortness of breath.   Cardiovascular: Negative for chest pain, palpitations and leg swelling.  Gastrointestinal: Negative for abdominal pain, constipation and heartburn.  Musculoskeletal: Negative for back pain, joint pain and myalgias.  Skin: Negative.   Neurological: Negative for dizziness.  Psychiatric/Behavioral: The patient is not nervous/anxious.     Physical Exam Constitutional:      General: He is not in acute distress.    Appearance: He is well-developed. He is not diaphoretic.  Neck:     Thyroid: No thyromegaly.  Cardiovascular:     Rate and Rhythm: Normal rate and regular rhythm.     Pulses: Normal pulses.     Heart sounds: Normal heart sounds.  Pulmonary:     Effort: Pulmonary effort is normal. No respiratory distress.     Breath sounds: Normal breath sounds.  Abdominal:     General: Bowel sounds are normal. There is no distension.     Palpations: Abdomen is  soft.     Tenderness: There is no abdominal tenderness.  Musculoskeletal:     Cervical back: Neck supple.     Right lower leg: No edema.     Left lower leg: No edema.     Comments: Left hemiplegia with stiffness present      Lymphadenopathy:     Cervical: No cervical adenopathy.  Skin:    General: Skin is warm and dry.  Neurological:     Mental Status: He is alert and oriented to person, place, and time.  Psychiatric:        Mood and Affect: Mood normal.         ASSESSMENT/ PLAN:  TODAY;   1. Diabetic peripheral neuropathy: is stable will continue gabapentin 200 mg nightly   2. Chronic constipation is stable will continue colace twice daily as needed and miralax daily as needed  3. Cerebrovascular accident (CVA) due to embolism of right middle cerebral artery: s/p decompressive right frontotemporoparietal craniectomy for decompression with duraplasty 2018: is stable will continue eliquis 5 mg twice daily   PREVIOUS   4. Hemiparesis affecting left side as late effect of cerebrovascular accident (CVA) is stable will continue baclofen 5 mg three times daily for spasticity  5. GERD without esophagitis: is stable will continue prilosec 40 mg daily   6. Dyslipidemia associated with type 2 diabetes mellitus: stable LDL 84 will continue lipitor 40 mg daily   7. AF (paroxysmal atrial fibrillation) heart rate is stable will continue eliquis 5 mg twice daily and toprol xl 12.5 mg twice daily for rate control  8. hypertension associated with type 2 diabetes mellitus: is stable b/p 120/78 will continue toprol xl 12.5 mg twice daily   9. Type 2 diabetes mellitus with neurological complications is stable hgb a1c 4.3 diet controlled.       MD is aware of resident's narcotic use and is in agreement with current plan of care. We will attempt to wean resident as appropriate.  Ok Edwards NP [facility] Adult [facility]  Contact [phone] Monday through Friday 8am-  5pm  After hours  call [phone]

## 2019-09-09 ENCOUNTER — Telehealth: Payer: Self-pay

## 2019-09-09 NOTE — Telephone Encounter (Signed)
Dr.McDaniel from [facility] called and states that patient plans to have multiple teeth taken out as well as some modification to bone of jaw. Dr.McDaniel is concerned of this procedure due to patient having multiple strokes and also taking Eliquis. Dr.McDaniel wants to know if patient can have medication placed on hold or have medication bridged if necessary, before scheduling patient for procedure. Contact Office Back [phone]. Please Advise.

## 2019-09-13 NOTE — Telephone Encounter (Signed)
SNF Patient sent to covering provider as FYI.

## 2019-10-02 ENCOUNTER — Encounter: Payer: Self-pay | Admitting: Internal Medicine

## 2019-10-02 ENCOUNTER — Non-Acute Institutional Stay (SKILLED_NURSING_FACILITY): Payer: Medicaid Other | Admitting: Internal Medicine

## 2019-10-02 DIAGNOSIS — E1169 Type 2 diabetes mellitus with other specified complication: Secondary | ICD-10-CM | POA: Diagnosis not present

## 2019-10-02 DIAGNOSIS — E785 Hyperlipidemia, unspecified: Secondary | ICD-10-CM

## 2019-10-02 DIAGNOSIS — Z9889 Other specified postprocedural states: Secondary | ICD-10-CM

## 2019-10-02 DIAGNOSIS — I69354 Hemiplegia and hemiparesis following cerebral infarction affecting left non-dominant side: Secondary | ICD-10-CM

## 2019-10-02 NOTE — Progress Notes (Signed)
Location:  [place] Room Number: 117-D Place of Service:  SNF (31)  Hennie Duos, MD  Patient Care Team: Hennie Duos, MD as PCP - General (Internal Medicine) [facility], [place] [facility] [name], Phylis Bougie, NP as Nurse Practitioner (Geriatric Medicine)  Extended Emergency Contact Information Primary Emergency Contact: Theresia Bough [place] of [place] Phone: [phone] Relation: Sister Secondary Emergency Contact: Nickie Retort [place] of [place] Mobile Phone: [phone] Relation: Aunt    Allergies: Cheese and Penicillins  Chief Complaint  Patient presents with  . Medical Management of Chronic Issues    Routine [place] visit    HPI: Patient is a 59 y.o. male who is being seen for routine issues of hyperlipidemia, left hemiparesis, and status post craniectomy.  Past Medical History:  Diagnosis Date  . DM (diabetes mellitus) ([place])   . Hypertension   . Stroke [facility])     Past Surgical History:  Procedure Laterality Date  . CRANIECTOMY Right 04/01/2017   Procedure: RIGHT DECOMPRESSIVE CRANIECTOMY;  Surgeon: Ditty, Kevan Ny, MD;  Location: [place];  Service: Neurosurgery;  Laterality: Right;  . ESOPHAGOGASTRODUODENOSCOPY N/A 04/13/2017   Procedure: ESOPHAGOGASTRODUODENOSCOPY (EGD);  Surgeon: Georganna Skeans, MD;  Location: [facility];  Service: General;  Laterality: N/A;  bedside  . INCISION AND DRAINAGE PERIRECTAL ABSCESS N/A 09/28/2017   Procedure: IRRIGATION AND DEBRIDEMENT PERIANAL ABSCESS;  Surgeon: Virl Cagey, MD;  Location: [facility];  Service: General;  Laterality: N/A;  . PEG PLACEMENT N/A 04/13/2017   Procedure: PERCUTANEOUS ENDOSCOPIC GASTROSTOMY (PEG) PLACEMENT;  Surgeon: Georganna Skeans, MD;  Location: [place];  Service: General;  Laterality: N/A;    Allergies as of 10/02/2019      Reactions   Cheese    Penicillins Hives   Has patient had a PCN  reaction causing immediate rash, facial/tongue/throat swelling, SOB or lightheadedness with hypotension: Unknown Has patient had a PCN reaction causing severe rash involving mucus membranes or skin necrosis: Unknown Has patient had a PCN reaction that required hospitalization: No Has patient had a PCN reaction occurring within the last 10 years: No If all of the above answers are "NO", then may proceed with Cephalosporin use.      Medication List    Notice   This visit is during an admission. Changes to the med list made in this visit will be reflected in the After Visit Summary of the admission.    Current Outpatient Medications on File Prior to Visit  Medication Sig Dispense Refill  . acetaminophen (TYLENOL) 325 MG tablet Take 650 mg by mouth every 4 (four) hours as needed. Not to exceed 3 grams in 24 hours    . apixaban (ELIQUIS) 5 MG TABS tablet Take 5 mg by mouth 2 (two) times daily.     Marland Kitchen atorvastatin (LIPITOR) 40 MG tablet Take 40 mg by mouth daily.    . Baclofen 5 MG TABS Take 5 mg by mouth 3 (three) times daily. 90 tablet 3  . bisacodyl (DULCOLAX) 10 MG suppository Place 1 suppository (10 mg total) rectally daily as needed for moderate constipation. 12 suppository 0  . Cholecalciferol 1.25 MG (50000 UT) capsule Take 50,000 Units by mouth. Once a day on mon and thursday    . clindamycin (CLEOCIN) 300 MG capsule Take 300 mg by mouth 3 (three) times daily.    . clotrimazole (LOTRIMIN) 1 % cream Apply 1 application topically 2 (two) times daily. To fungal rash until resolved    .  coal tar (NEUTROGENA T-GEL) 0.5 % shampoo Apply 1 application topically See admin instructions. Apply to scalp on shower days twice a week Wed And Sat prn    . Dextromethorphan-guaiFENesin (ROBITUSSIN COUGH+CHEST CONG DM PO) Take 10 mLs by mouth every 6 (six) hours as needed.    . gabapentin (NEURONTIN) 100 MG capsule Take 200 mg by mouth at bedtime.     . Menthol, Topical Analgesic, (BIOFREEZE) 4 % GEL Apply  1 application topically 2 (two) times daily as needed. Apply to left shoulder    . metoprolol succinate (TOPROL-XL) 25 MG 24 hr tablet Take 12.5 mg by mouth 2 (two) times daily.    . NON FORMULARY Diet type:   Regular Diet    . omeprazole (PRILOSEC) 40 MG capsule Take 40 mg by mouth daily.     Marland Kitchen senna (SENOKOT) 8.6 MG TABS tablet Take 2 tablets by mouth 2 (two) times daily.      No current facility-administered medications on file prior to visit.     No orders of the defined types were placed in this encounter.   Immunization History  Administered Date(s) Administered  . Influenza-Unspecified 05/13/2019  . Moderna SARS-COVID-2 Vaccination 08/14/2019, 09/11/2019  . Pneumococcal-Unspecified 05/10/2018  . Tdap 01/07/2019    Social History   Tobacco Use  . Smoking status: Former Smoker    Types: Cigarettes  . Smokeless tobacco: Never Used  . Tobacco comment: UTA  Substance Use Topics  . Alcohol use: No    Comment: UTA    Review of Systems  GENERAL:  no fevers, fatigue, appetite changes SKIN: No itching, rash HEENT: No complaint RESPIRATORY: No cough, wheezing, SOB CARDIAC: No chest pain, palpitations, lower extremity edema  GI: No abdominal pain, No N/V/D or constipation, No heartburn or reflux  GU: No dysuria, frequency or urgency, or incontinence  MUSCULOSKELETAL: No unrelieved bone/joint pain NEUROLOGIC: No headache, dizziness  PSYCHIATRIC: No overt anxiety or sadness  Vitals:   10/02/19 1612  BP: 120/78  Pulse: 85  Resp: 20  Temp: 98.2 F (36.8 C)  SpO2: 95%   Body mass index is 29.44 kg/m. Physical Exam  GENERAL APPEARANCE: Alert, conversant, No acute distress  SKIN: No diaphoresis rash HEENT: Unremarkable RESPIRATORY: Breathing is even, unlabored. Lung sounds are clear   CARDIOVASCULAR: Heart RRR no murmurs, rubs or gallops. No peripheral edema  GASTROINTESTINAL: Abdomen is soft, non-tender, not distended w/ normal bowel sounds.  GENITOURINARY:  Bladder non tender, not distended  MUSCULOSKELETAL: No abnormal joints or musculature NEUROLOGIC: Cranial nerves 2-12 grossly intact.  Left hemiparesis spasticity PSYCHIATRIC: Mood and affect appropriate to situation, no behavioral issues  Patient Active Problem List   Diagnosis Date Noted  . Skin yeast infection 06/03/2019  . Hypertension associated with type 2 diabetes mellitus ([place]) 01/24/2019  . GERD without esophagitis 11/03/2018  . Dyslipidemia associated with type 2 diabetes mellitus ([place]) 11/03/2018  . Diabetic peripheral neuropathy ([place]) 11/03/2018  . Chronic constipation 11/03/2018  . Dandruff 11/03/2018  . Benign essential HTN   . Hemiparesis affecting left side as late effect of cerebrovascular accident (CVA) ([place])   . Vitamin D deficiency 09/13/2017  . 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  . Type 2 diabetes mellitus with neurological complications ([place])   . AF (paroxysmal atrial fibrillation) ([place]) 04/27/2017  . Cerebrovascular accident (CVA) due to embolism of right middle cerebral artery ([place]) 04/01/2017  . Cytotoxic cerebral edema ([facility]) 04/01/2017    CMP     Component Value Date/Time   NA 140  07/29/2019 1128   K 4.1 07/29/2019 1128   CL 106 07/29/2019 1128   CO2 28 07/29/2019 1128   GLUCOSE 97 07/29/2019 1128   BUN 11 07/29/2019 1128   CREATININE 0.85 07/29/2019 1128   CALCIUM 9.2 07/29/2019 1128   CALCIUM 11.5 (H) 09/05/2017 0709   PROT 7.2 07/29/2019 1128   ALBUMIN 3.2 (L) 07/29/2019 1128   AST 16 07/29/2019 1128   ALT 27 07/29/2019 1128   ALKPHOS 74 07/29/2019 1128   BILITOT 0.7 07/29/2019 1128   GFRNONAA >60 07/29/2019 1128   GFRAA >60 07/29/2019 1128   Recent Labs    11/28/18 0657 03/08/19 0930 07/29/19 1128  NA 140 138 140  K 4.4 4.2 4.1  CL 108 109 106  CO2 27 23 28   GLUCOSE 93 97 97  BUN 16 15 11   CREATININE 0.86 0.79 0.85  CALCIUM 9.1 9.1 9.2   Recent Labs    11/28/18 0657 03/08/19 0930 07/29/19 1128  AST 9* 10* 16  ALT 16 17 27     ALKPHOS 57 64 74  BILITOT 0.3 0.4 0.7  PROT 6.8 6.6 7.2  ALBUMIN 3.1* 3.1* 3.2*   Recent Labs    03/08/19 0930 05/20/19 0630  WBC 9.7 9.1  NEUTROABS 4.8 4.5  HGB 12.0* 12.6*  HCT 41.6 43.6  MCV 88.1 87.6  PLT 193 196   Recent Labs    11/28/18 0700 05/20/19 0630  CHOL 146 151  LDLCALC 84 84  TRIG 114 185*   Lab Results  Component Value Date   MICROALBUR 43.0 (H) 11/07/2018   Lab Results  Component Value Date   TSH 1.925 03/08/2019   Lab Results  Component Value Date   HGBA1C 4.6 (L) 07/29/2019   Lab Results  Component Value Date   CHOL 151 05/20/2019   HDL 30 (L) 05/20/2019   LDLCALC 84 05/20/2019   TRIG 185 (H) 05/20/2019   CHOLHDL 5.0 05/20/2019    Significant Diagnostic Results in last 30 days:  No results found.  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

## 2019-10-06 ENCOUNTER — Encounter: Payer: Self-pay | Admitting: Internal Medicine

## 2019-10-06 NOTE — Assessment & Plan Note (Signed)
LDL is 84; continue Lipitor 40 mg daily

## 2019-10-06 NOTE — Assessment & Plan Note (Signed)
Secondary to embolism right MCA; continue Eliquis 5 mg twice daily

## 2019-10-06 NOTE — Assessment & Plan Note (Signed)
Left spastic hemiparesis; continue baclofen 5 mg 3 times daily

## 2019-10-30 ENCOUNTER — Non-Acute Institutional Stay (SKILLED_NURSING_FACILITY): Payer: Medicaid Other | Admitting: Adult Health

## 2019-10-30 ENCOUNTER — Encounter: Payer: Self-pay | Admitting: Adult Health

## 2019-10-30 DIAGNOSIS — I69354 Hemiplegia and hemiparesis following cerebral infarction affecting left non-dominant side: Secondary | ICD-10-CM

## 2019-10-30 DIAGNOSIS — K219 Gastro-esophageal reflux disease without esophagitis: Secondary | ICD-10-CM

## 2019-10-30 DIAGNOSIS — E785 Hyperlipidemia, unspecified: Secondary | ICD-10-CM

## 2019-10-30 DIAGNOSIS — E1169 Type 2 diabetes mellitus with other specified complication: Secondary | ICD-10-CM | POA: Diagnosis not present

## 2019-10-30 NOTE — Progress Notes (Signed)
Location:    [place] Room Number: 117/D Place of Service:  SNF (31)   CODE STATUS: Full Code  Allergies  Allergen Reactions  . Cheese   . Penicillins Hives    Has patient had a PCN reaction causing immediate rash, facial/tongue/throat swelling, SOB or lightheadedness with hypotension: Unknown Has patient had a PCN reaction causing severe rash involving mucus membranes or skin necrosis: Unknown Has patient had a PCN reaction that required hospitalization: No Has patient had a PCN reaction occurring within the last 10 years: No If all of the above answers are "NO", then may proceed with Cephalosporin use.     Chief Complaint  Patient presents with  . Medical Management of Chronic Issues        Hemiparesis affecting left side as late effect of cerebrovascular accident (CVA)  GERD without esophagitis: Dyslipidemia  associated with type 2 diabetes mellitus:    HPI:  He is a 59 year old long term resident of this facility being seen for the management of his chronic illnesses: hemiparesis; gerd dyslipidemia. No complaints of spasticity no complaints of heart burn. No reports of uncontrolled pain.   Past Medical History:  Diagnosis Date  . DM (diabetes mellitus) ([place])   . Hypertension   . Stroke [facility])     Past Surgical History:  Procedure Laterality Date  . CRANIECTOMY Right 04/01/2017   Procedure: RIGHT DECOMPRESSIVE CRANIECTOMY;  Surgeon: Ditty, Kevan Ny, MD;  Location: [place];  Service: Neurosurgery;  Laterality: Right;  . ESOPHAGOGASTRODUODENOSCOPY N/A 04/13/2017   Procedure: ESOPHAGOGASTRODUODENOSCOPY (EGD);  Surgeon: Georganna Skeans, MD;  Location: [place];  Service: General;  Laterality: N/A;  bedside  . INCISION AND DRAINAGE PERIRECTAL ABSCESS N/A 09/28/2017   Procedure: IRRIGATION AND DEBRIDEMENT PERIANAL ABSCESS;  Surgeon: Virl Cagey, MD;  Location: [facility];  Service: General;  Laterality: N/A;  . PEG PLACEMENT N/A 04/13/2017   Procedure: PERCUTANEOUS ENDOSCOPIC GASTROSTOMY (PEG) PLACEMENT;  Surgeon: Georganna Skeans, MD;  Location: [facility];  Service: General;  Laterality: N/A;    Social History   Socioeconomic History  . Marital status: Unknown    Spouse name: Not on file  . Number of children: Not on file  . Years of education: Not on file  . Highest education level: Not on file  Occupational History  . Not on file  Tobacco Use  . Smoking status: Former Smoker    Types: Cigarettes  . Smokeless tobacco: Never Used  . Tobacco comment: UTA  Substance and Sexual Activity  . Alcohol use: No    Comment: UTA  . Drug use: No    Comment: UTA  . Sexual activity: Not Currently    Birth control/protection: None  Other Topics Concern  . Not on file  Social History Narrative  . Not on file   Social Determinants of Health   Financial Resource Strain:   . Difficulty of Paying Living Expenses:   Food Insecurity:   . Worried About Charity fundraiser in the Last Year:   . Arboriculturist in the Last Year:   Transportation Needs:   . Film/video editor (Medical):   Marland Kitchen Lack of Transportation (Non-Medical):   Physical Activity:   . Days of Exercise per Week:   . Minutes of Exercise per Session:   Stress:   . Feeling of Stress :   Social Connections:   . Frequency of Communication with Friends and Family:   . Frequency of Social Gatherings  with Friends and Family:   . Attends Religious Services:   . Active Member of Clubs or Organizations:   . Attends Archivist Meetings:   Marland Kitchen Marital Status:   Intimate Partner Violence:   . Fear of Current or Ex-Partner:   . Emotionally Abused:   Marland Kitchen Physically Abused:   . Sexually Abused:    Family History  Problem Relation Age of Onset  . Hypertension Father       VITAL SIGNS BP 133/78   Pulse 93   Temp 98.4 F (36.9 C) (Oral)   Resp 18   Ht 5\' 6"  (1.676 m)   Wt 182 lb 6.4 oz (82.7 kg)   SpO2 95%   BMI 29.44 kg/m   Outpatient  Encounter Medications as of 10/30/2019  Medication Sig  . acetaminophen (TYLENOL) 325 MG tablet Take 650 mg by mouth every 4 (four) hours as needed. Not to exceed 3 grams in 24 hours  . apixaban (ELIQUIS) 5 MG TABS tablet Take 5 mg by mouth 2 (two) times daily.   Marland Kitchen atorvastatin (LIPITOR) 40 MG tablet Take 40 mg by mouth daily.  . Baclofen 5 MG TABS Take 5 mg by mouth 3 (three) times daily.  . bisacodyl (DULCOLAX) 10 MG suppository Place 1 suppository (10 mg total) rectally daily as needed for moderate constipation.  . Cholecalciferol 1.25 MG (50000 UT) capsule Take 50,000 Units by mouth. Once a day on mon and thursday  . coal tar (NEUTROGENA T-GEL) 0.5 % shampoo Apply 1 application topically See admin instructions. Apply to scalp on shower days twice a week Wed And Sat prn  . Dextromethorphan-guaiFENesin (ROBITUSSIN COUGH+CHEST CONG DM PO) Take 10 mLs by mouth every 6 (six) hours as needed.  . gabapentin (NEURONTIN) 100 MG capsule Take 200 mg by mouth at bedtime.   . Menthol, Topical Analgesic, (BIOFREEZE) 4 % GEL Apply 1 application topically 2 (two) times daily as needed. Apply to left shoulder  . metoprolol succinate (TOPROL-XL) 25 MG 24 hr tablet Take 12.5 mg by mouth 2 (two) times daily.  . NON FORMULARY Diet type:   Regular Diet  . omeprazole (PRILOSEC) 40 MG capsule Take 40 mg by mouth daily.   Marland Kitchen senna (SENOKOT) 8.6 MG TABS tablet Take 2 tablets by mouth 2 (two) times daily.   . [DISCONTINUED] clindamycin (CLEOCIN) 300 MG capsule Take 300 mg by mouth 3 (three) times daily.  . [DISCONTINUED] clotrimazole (LOTRIMIN) 1 % cream Apply 1 application topically 2 (two) times daily. To fungal rash until resolved   No facility-administered encounter medications on file as of 10/30/2019.     SIGNIFICANT DIAGNOSTIC EXAMS   PREVIOUS:   08-24-18: ct of head: 1. 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 Increased volume loss of the infarcted brain as well as  decreased herniation of brain and dura through the craniectomy defect. 2. No new intracranial abnormality.  NO NEW EXAMS.   LABS REVIEWED PREVIOUS:   10-30-18: hgb a1c 4.3 11-07-18: urine micro-albumin 43.0  11-28-18: glucose 93; bun 16; creat 0.86; k+ 4.4; an++ 140; ca 9.0; liver normal albumin 3.1; chol 146; ldl 84; trig 114; hdl 39  03-08-19: wbc 9.7; hgb 12.0; hct 41.6; mcv 88.1 plt 193; glucose 97; bun 15; creat 0.79; k+ 4.2; na++ 138; ca 9.1; liver normal albumin 3.1; tsh 1.925 vit D 22.8 05-20-19: wbc 9.1; hgb 12.6; hct 43.6; mcv 87.6 plt 196; chol 151; ldl 84; trig 185; hdl 30 vit D 24.00 PSA  1.60 07-29-19: : glucose 97; bun 11; creat 0.85; k+ 4.1; na++ 140; ca 9.2 liver normal albumin 3.2 hgb a1c 4.6 08-21-19: vit D 24.16   NO NEW LABS.   Review of Systems  Constitutional: Negative for malaise/fatigue.  Respiratory: Negative for cough and shortness of breath.   Cardiovascular: Negative for chest pain, palpitations and leg swelling.  Gastrointestinal: Negative for abdominal pain, constipation and heartburn.  Musculoskeletal: Negative for back pain, joint pain and myalgias.  Skin: Negative.   Neurological: Negative for dizziness.  Psychiatric/Behavioral: The patient is not nervous/anxious.     Physical Exam Constitutional:      General: He is not in acute distress.    Appearance: He is well-developed. He is not diaphoretic.  Neck:     Thyroid: No thyromegaly.  Cardiovascular:     Rate and Rhythm: Normal rate and regular rhythm.     Pulses: Normal pulses.     Heart sounds: Normal heart sounds.  Pulmonary:     Effort: Pulmonary effort is normal. No respiratory distress.     Breath sounds: Normal breath sounds.  Abdominal:     General: Bowel sounds are normal. There is no distension.     Palpations: Abdomen is soft.     Tenderness: There is no abdominal tenderness.  Musculoskeletal:     Cervical back: Neck supple.     Right lower leg: No edema.     Left lower leg: No  edema.     Comments: Left hemiplegia with stiffness present       Lymphadenopathy:     Cervical: No cervical adenopathy.  Skin:    General: Skin is warm and dry.  Neurological:     Mental Status: He is alert. Mental status is at baseline.  Psychiatric:        Mood and Affect: Mood normal.         ASSESSMENT/ PLAN:  TODAY;   1. Hemiparesis affecting left side as late effect of cerebrovascular accident (CVA) is stable will continue baclofen 5 mg three times daily for spasticity.   2. GERD without esophagitis: is stable will continue prilosec 40 mg daily   3. Dyslipidemia  associated with type 2 diabetes mellitus: is stable LDL 84 will continue lipitor 4 0mg  daily   PREVIOUS   4. AF (paroxysmal atrial fibrillation) heart rate is stable will continue eliquis 5 mg twice daily and toprol xl 12.5 mg twice daily for rate control  5. hypertension associated with type 2 diabetes mellitus: is stable b/p 133/78 will continue toprol xl 12.5 mg twice daily   6. Type 2 diabetes mellitus with neurological complications is stable hgb a1c 4.3 diet controlled.   7. Diabetic peripheral neuropathy: is stable will continue gabapentin 200 mg nightly   8. Chronic constipation is stable will continue colace twice daily as needed and miralax daily as needed  9. Cerebrovascular accident (CVA) due to embolism of right middle cerebral artery: s/p decompressive right frontotemporoparietal craniectomy for decompression with duraplasty 2018: is stable will continue eliquis 5 mg twice daily         MD is aware of resident's narcotic use and is in agreement with current plan of care. We will attempt to wean resident as appropriate.  Ok Edwards NP [facility]  Contact [phone] Monday through Friday 8am- 5pm  After hours call [phone]

## 2019-11-07 ENCOUNTER — Other Ambulatory Visit (HOSPITAL_COMMUNITY)
Admission: RE | Admit: 2019-11-07 | Discharge: 2019-11-07 | Disposition: A | Discharge status: Home / Self Care | Payer: Medicaid Other | Source: Skilled Nursing Facility | Attending: Internal Medicine | Admitting: Internal Medicine

## 2019-11-07 DIAGNOSIS — I63311 Cerebral infarction due to thrombosis of right middle cerebral artery: Secondary | ICD-10-CM | POA: Insufficient documentation

## 2019-11-07 LAB — VITAMIN D 25 HYDROXY (VIT D DEFICIENCY, FRACTURES): Vit D, 25-Hydroxy: 46.75 ng/mL (ref 30–100)

## 2019-11-29 ENCOUNTER — Encounter: Payer: Self-pay | Admitting: Adult Health

## 2019-11-29 ENCOUNTER — Non-Acute Institutional Stay (SKILLED_NURSING_FACILITY): Payer: Medicaid Other | Admitting: Adult Health

## 2019-11-29 DIAGNOSIS — I480 Paroxysmal atrial fibrillation: Secondary | ICD-10-CM

## 2019-11-29 DIAGNOSIS — I10 Essential (primary) hypertension: Secondary | ICD-10-CM

## 2019-11-29 DIAGNOSIS — E1149 Type 2 diabetes mellitus with other diabetic neurological complication: Secondary | ICD-10-CM | POA: Diagnosis not present

## 2019-11-29 DIAGNOSIS — E1159 Type 2 diabetes mellitus with other circulatory complications: Secondary | ICD-10-CM

## 2019-11-29 NOTE — Progress Notes (Signed)
Location:    [place] Room Number: 117/D Place of Service:  SNF (31)   CODE STATUS: Full Code  Allergies  Allergen Reactions  . Cheese   . Penicillins Hives    Has patient had a PCN reaction causing immediate rash, facial/tongue/throat swelling, SOB or lightheadedness with hypotension: Unknown Has patient had a PCN reaction causing severe rash involving mucus membranes or skin necrosis: Unknown Has patient had a PCN reaction that required hospitalization: No Has patient had a PCN reaction occurring within the last 10 years: No If all of the above answers are "NO", then may proceed with Cephalosporin use.     Chief Complaint  Patient presents with  . Medical Management of Chronic Issues         AF (paroxysmal atrial fibrillation)    Hypertension associated with type 2 diabetes mellitus:   Type 2 diabetes mellitus with neurological complications:    HPI:  He is a 59 year old long term resident of this facility being seen for the management of his chronic illnesses: afib; hypertension; diabetes. There are no reports of uncontrolled pain; no changes in appetite; no reports of anxiety or agitation.   Past Medical History:  Diagnosis Date  . DM (diabetes mellitus) ([place])   . Hypertension   . Stroke [facility])     Past Surgical History:  Procedure Laterality Date  . CRANIECTOMY Right 04/01/2017   Procedure: RIGHT DECOMPRESSIVE CRANIECTOMY;  Surgeon: Ditty, Kevan Ny, MD;  Location: [place];  Service: Neurosurgery;  Laterality: Right;  . ESOPHAGOGASTRODUODENOSCOPY N/A 04/13/2017   Procedure: ESOPHAGOGASTRODUODENOSCOPY (EGD);  Surgeon: Georganna Skeans, MD;  Location: [place];  Service: General;  Laterality: N/A;  bedside  . INCISION AND DRAINAGE PERIRECTAL ABSCESS N/A 09/28/2017   Procedure: IRRIGATION AND DEBRIDEMENT PERIANAL ABSCESS;  Surgeon: Virl Cagey, MD;  Location: [facility];  Service: General;  Laterality: N/A;  . PEG PLACEMENT N/A 04/13/2017   Procedure: PERCUTANEOUS ENDOSCOPIC GASTROSTOMY (PEG) PLACEMENT;  Surgeon: Georganna Skeans, MD;  Location: [facility] ENDOSCOPY;  Service: General;  Laterality: N/A;    Social History   Socioeconomic History  . Marital status: Unknown    Spouse name: Not on file  . Number of children: Not on file  . Years of education: Not on file  . Highest education level: Not on file  Occupational History  . Not on file  Tobacco Use  . Smoking status: Former Smoker    Types: Cigarettes  . Smokeless tobacco: Never Used  . Tobacco comment: UTA  Substance and Sexual Activity  . Alcohol use: No    Comment: UTA  . Drug use: No    Comment: UTA  . Sexual activity: Not Currently    Birth control/protection: None  Other Topics Concern  . Not on file  Social History Narrative  . Not on file   Social Determinants of Health   Financial Resource Strain:   . Difficulty of Paying Living Expenses:   Food Insecurity:   . Worried About Charity fundraiser in the Last Year:   . Arboriculturist in the Last Year:   Transportation Needs:   . Film/video editor (Medical):   Marland Kitchen Lack of Transportation (Non-Medical):   Physical Activity:   . Days of Exercise per Week:   . Minutes of Exercise per Session:   Stress:   . Feeling of Stress :   Social Connections:   . Frequency of Communication with Friends and Family:   .  Frequency of Social Gatherings with Friends and Family:   . Attends Religious Services:   . Active Member of Clubs or Organizations:   . Attends Archivist Meetings:   Marland Kitchen Marital Status:   Intimate Partner Violence:   . Fear of Current or Ex-Partner:   . Emotionally Abused:   Marland Kitchen Physically Abused:   . Sexually Abused:    Family History  Problem Relation Age of Onset  . Hypertension Father       VITAL SIGNS BP 129/79   Pulse 77   Temp 97.6 F (36.4 C) (Oral)   Resp 20   Ht 5\' 6"  (1.676 m)   Wt 178 lb 3.2 oz (80.8 kg)   SpO2 95%   BMI 28.76 kg/m   Outpatient  Encounter Medications as of 11/29/2019  Medication Sig  . acetaminophen (TYLENOL) 325 MG tablet Take 650 mg by mouth every 4 (four) hours as needed. Not to exceed 3 grams in 24 hours  . apixaban (ELIQUIS) 5 MG TABS tablet Take 5 mg by mouth 2 (two) times daily.   Marland Kitchen atorvastatin (LIPITOR) 40 MG tablet Take 40 mg by mouth daily.  . Baclofen 5 MG TABS Take 5 mg by mouth 3 (three) times daily.  . bisacodyl (DULCOLAX) 10 MG suppository Place 1 suppository (10 mg total) rectally daily as needed for moderate constipation.  . Cholecalciferol 1.25 MG (50000 UT) capsule Take 50,000 Units by mouth. Once a day on mon and thursday  . coal tar (NEUTROGENA T-GEL) 0.5 % shampoo Apply 1 application topically See admin instructions. Apply to scalp on shower days twice a week Wed And Sat prn  . Dextromethorphan-guaiFENesin (ROBITUSSIN COUGH+CHEST CONG DM PO) Take 10 mLs by mouth every 6 (six) hours as needed.  . gabapentin (NEURONTIN) 100 MG capsule Take 200 mg by mouth at bedtime.   . Menthol, Topical Analgesic, (BIOFREEZE) 4 % GEL Apply 1 application topically 2 (two) times daily as needed. Apply to left shoulder  . metoprolol succinate (TOPROL-XL) 25 MG 24 hr tablet Take 12.5 mg by mouth 2 (two) times daily.  . NON FORMULARY Diet type:   Regular Diet  . omeprazole (PRILOSEC) 40 MG capsule Take 40 mg by mouth daily.   Marland Kitchen senna (SENOKOT) 8.6 MG TABS tablet Take 2 tablets by mouth 2 (two) times daily.    No facility-administered encounter medications on file as of 11/29/2019.     SIGNIFICANT DIAGNOSTIC EXAMS   PREVIOUS:   08-24-18: ct of head: 1. 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 Increased volume loss of the infarcted brain as well as decreased herniation of brain and dura through the craniectomy defect. 2. No new intracranial abnormality.  NO NEW EXAMS.   LABS REVIEWED PREVIOUS:   11-07-18: urine micro-albumin 43.0  11-28-18: glucose 93; bun 16; creat 0.86; k+ 4.4;  an++ 140; ca 9.0; liver normal albumin 3.1; chol 146; ldl 84; trig 114; hdl 39  03-08-19: wbc 9.7; hgb 12.0; hct 41.6; mcv 88.1 plt 193; glucose 97; bun 15; creat 0.79; k+ 4.2; na++ 138; ca 9.1; liver normal albumin 3.1; tsh 1.925 vit D 22.8 05-20-19: wbc 9.1; hgb 12.6; hct 43.6; mcv 87.6 plt 196; chol 151; ldl 84; trig 185; hdl 30 vit D 24.00 PSA 1.60 07-29-19: : glucose 97; bun 11; creat 0.85; k+ 4.1; na++ 140; ca 9.2 liver normal albumin 3.2 hgb a1c 4.6 08-21-19: vit D 24.16   TODAY  11-07-19: vit D 46.75  Review of Systems  Constitutional: Negative for malaise/fatigue.  Respiratory: Negative for cough and shortness of breath.   Cardiovascular: Negative for chest pain, palpitations and leg swelling.  Gastrointestinal: Negative for abdominal pain, constipation and heartburn.  Musculoskeletal: Negative for back pain, joint pain and myalgias.  Skin: Negative.   Neurological: Negative for dizziness.  Psychiatric/Behavioral: The patient is not nervous/anxious.     Physical Exam Constitutional:      General: He is not in acute distress.    Appearance: He is well-developed. He is not diaphoretic.  Neck:     Thyroid: No thyromegaly.  Cardiovascular:     Rate and Rhythm: Normal rate and regular rhythm.     Pulses: Normal pulses.     Heart sounds: Normal heart sounds.  Pulmonary:     Effort: Pulmonary effort is normal. No respiratory distress.     Breath sounds: Normal breath sounds.  Abdominal:     General: Bowel sounds are normal. There is no distension.     Palpations: Abdomen is soft.     Tenderness: There is no abdominal tenderness.  Musculoskeletal:     Cervical back: Neck supple.     Right lower leg: No edema.     Left lower leg: No edema.     Comments: Left hemiplegia with stiffness present        Lymphadenopathy:     Cervical: No cervical adenopathy.  Skin:    General: Skin is warm and dry.  Neurological:     Mental Status: He is alert. Mental status is at baseline.   Psychiatric:        Mood and Affect: Mood normal.         ASSESSMENT/ PLAN:  TODAY;   1. AF (paroxysmal atrial fibrillation) heart rate is stable will continue toprol xl 12.5 mg twice daily; eliquis 5 mg twice daily   2. Hypertension associated with type 2 diabetes mellitus: is stable b/p 129/79 will continue toprol xl 12.5 mg twice daily   3. Type 2 diabetes mellitus with neurological complications: is stable hgg a1c 4.3 diet controlled.   PREVIOUS   4. Diabetic peripheral neuropathy: is stable will continue gabapentin 200 mg nightly   5. Chronic constipation is stable will continue colace twice daily as needed and miralax daily as needed  6. Cerebrovascular accident (CVA) due to embolism of right middle cerebral artery: s/p decompressive right frontotemporoparietal craniectomy for decompression with duraplasty 2018: is stable will continue eliquis 5 mg twice daily   7. Hemiparesis affecting left side as late effect of cerebrovascular accident (CVA) is stable will continue baclofen 5 mg three times daily for spasticity.   8. GERD without esophagitis: is stable will continue prilosec 40 mg daily   9. Dyslipidemia  associated with type 2 diabetes mellitus: is stable LDL 84 will continue lipitor 4 0mg  daily    Will check cbc; cmp hgb a1c lipids      MD is aware of resident's narcotic use and is in agreement with current plan of care. We will attempt to wean resident as appropriate.  Ok Edwards NP [facility]  Contact [phone] Monday through Friday 8am- 5pm  After hours call [phone]

## 2019-12-05 ENCOUNTER — Other Ambulatory Visit (HOSPITAL_COMMUNITY)
Admission: RE | Admit: 2019-12-05 | Discharge: 2019-12-05 | Disposition: A | Discharge status: Home / Self Care | Payer: Medicaid Other | Source: Skilled Nursing Facility | Attending: Adult Health | Admitting: Adult Health

## 2019-12-05 DIAGNOSIS — I63311 Cerebral infarction due to thrombosis of right middle cerebral artery: Secondary | ICD-10-CM | POA: Diagnosis present

## 2019-12-05 LAB — COMPREHENSIVE METABOLIC PANEL WITH GFR
ALT: 22 U/L (ref 0–44)
AST: 14 U/L — ABNORMAL LOW (ref 15–41)
Albumin: 3.1 g/dL — ABNORMAL LOW (ref 3.5–5.0)
Alkaline Phosphatase: 61 U/L (ref 38–126)
Anion gap: 9 (ref 5–15)
BUN: 11 mg/dL (ref 6–20)
CO2: 23 mmol/L (ref 22–32)
Calcium: 9.2 mg/dL (ref 8.9–10.3)
Chloride: 108 mmol/L (ref 98–111)
Creatinine, Ser: 0.8 mg/dL (ref 0.61–1.24)
GFR calc Af Amer: >60 mL/min
GFR calc non Af Amer: >60 mL/min
Glucose, Bld: 84 mg/dL (ref 70–99)
Potassium: 4.1 mmol/L (ref 3.5–5.1)
Sodium: 140 mmol/L (ref 135–145)
Total Bilirubin: 0.7 mg/dL (ref 0.3–1.2)
Total Protein: 6.6 g/dL (ref 6.5–8.1)

## 2019-12-05 LAB — CBC
HCT: 47.5 % (ref 39.0–52.0)
Hemoglobin: 14.2 g/dL (ref 13.0–17.0)
MCH: 28.1 pg (ref 26.0–34.0)
MCHC: 29.9 g/dL — ABNORMAL LOW (ref 30.0–36.0)
MCV: 94.1 fL (ref 80.0–100.0)
Platelets: 158 10*3/uL (ref 150–400)
RBC: 5.05 MIL/uL (ref 4.22–5.81)
RDW: 14.5 % (ref 11.5–15.5)
WBC: 10.5 10*3/uL (ref 4.0–10.5)
nRBC: 0 % (ref 0.0–0.2)

## 2019-12-05 LAB — LIPID PANEL
Cholesterol: 132 mg/dL (ref 0–200)
HDL: 33 mg/dL — ABNORMAL LOW
LDL Cholesterol: 77 mg/dL (ref 0–99)
Total CHOL/HDL Ratio: 4 ratio
Triglycerides: 110 mg/dL
VLDL: 22 mg/dL (ref 0–40)

## 2019-12-05 LAB — HEMOGLOBIN A1C
Hgb A1c MFr Bld: 4.9 % (ref 4.8–5.6)
Mean Plasma Glucose: 93.93 mg/dL

## 2019-12-31 ENCOUNTER — Encounter: Payer: Self-pay | Admitting: Adult Health

## 2019-12-31 ENCOUNTER — Non-Acute Institutional Stay (SKILLED_NURSING_FACILITY): Payer: Medicaid Other | Admitting: Adult Health

## 2019-12-31 DIAGNOSIS — K5909 Other constipation: Secondary | ICD-10-CM

## 2019-12-31 DIAGNOSIS — E1142 Type 2 diabetes mellitus with diabetic polyneuropathy: Secondary | ICD-10-CM

## 2019-12-31 DIAGNOSIS — I63411 Cerebral infarction due to embolism of right middle cerebral artery: Secondary | ICD-10-CM | POA: Diagnosis not present

## 2019-12-31 NOTE — Progress Notes (Signed)
Location:    [place] Room Number: 117/D Place of Service:  SNF (31)   CODE STATUS: Full Code  Allergies  Allergen Reactions  . Cheese   . Penicillins Hives    Has patient had a PCN reaction causing immediate rash, facial/tongue/throat swelling, SOB or lightheadedness with hypotension: Unknown Has patient had a PCN reaction causing severe rash involving mucus membranes or skin necrosis: Unknown Has patient had a PCN reaction that required hospitalization: No Has patient had a PCN reaction occurring within the last 10 years: No If all of the above answers are "NO", then may proceed with Cephalosporin use.     Chief Complaint  Patient presents with  . Medical Management of Chronic Issues           Diabetic peripheral neuropathy:  Chronic constipation    Cerebrovascular accident (CVA) due to embolism of right middle cerebral artery    HPI:  He is a 59 year old long term resident of this facility being seen for the management of her chronic illnesses: peripheral neuropathy; constipation cva. There are no reports of uncontrolled pain; no changes in appetite; no constipation.   Past Medical History:  Diagnosis Date  . DM (diabetes mellitus) ([place])   . Hypertension   . Stroke [facility])     Past Surgical History:  Procedure Laterality Date  . CRANIECTOMY Right 04/01/2017   Procedure: RIGHT DECOMPRESSIVE CRANIECTOMY;  Surgeon: Ditty, Kevan Ny, MD;  Location: [place];  Service: Neurosurgery;  Laterality: Right;  . ESOPHAGOGASTRODUODENOSCOPY N/A 04/13/2017   Procedure: ESOPHAGOGASTRODUODENOSCOPY (EGD);  Surgeon: Georganna Skeans, MD;  Location: [place];  Service: General;  Laterality: N/A;  bedside  . INCISION AND DRAINAGE PERIRECTAL ABSCESS N/A 09/28/2017   Procedure: IRRIGATION AND DEBRIDEMENT PERIANAL ABSCESS;  Surgeon: Virl Cagey, MD;  Location: [facility];  Service: General;  Laterality: N/A;  . PEG PLACEMENT N/A 04/13/2017   Procedure: PERCUTANEOUS  ENDOSCOPIC GASTROSTOMY (PEG) PLACEMENT;  Surgeon: Georganna Skeans, MD;  Location: [facility] ENDOSCOPY;  Service: General;  Laterality: N/A;    Social History   Socioeconomic History  . Marital status: Unknown    Spouse name: Not on file  . Number of children: Not on file  . Years of education: Not on file  . Highest education level: Not on file  Occupational History  . Not on file  Tobacco Use  . Smoking status: Former Smoker    Types: Cigarettes  . Smokeless tobacco: Never Used  . Tobacco comment: UTA  Substance and Sexual Activity  . Alcohol use: No    Comment: UTA  . Drug use: No    Comment: UTA  . Sexual activity: Not Currently    Birth control/protection: None  Other Topics Concern  . Not on file  Social History Narrative  . Not on file   Social Determinants of Health   Financial Resource Strain:   . Difficulty of Paying Living Expenses:   Food Insecurity:   . Worried About Charity fundraiser in the Last Year:   . Arboriculturist in the Last Year:   Transportation Needs:   . Film/video editor (Medical):   Marland Kitchen Lack of Transportation (Non-Medical):   Physical Activity:   . Days of Exercise per Week:   . Minutes of Exercise per Session:   Stress:   . Feeling of Stress :   Social Connections:   . Frequency of Communication with Friends and Family:   . Frequency of Social  Gatherings with Friends and Family:   . Attends Religious Services:   . Active Member of Clubs or Organizations:   . Attends Archivist Meetings:   Marland Kitchen Marital Status:   Intimate Partner Violence:   . Fear of Current or Ex-Partner:   . Emotionally Abused:   Marland Kitchen Physically Abused:   . Sexually Abused:    Family History  Problem Relation Age of Onset  . Hypertension Father       VITAL SIGNS BP 129/79   Pulse 77   Temp 98.4 F (36.9 C) (Oral)   Resp 20   Ht 5\' 6"  (1.676 m)   Wt 181 lb 3.2 oz (82.2 kg)   SpO2 95%   BMI 29.25 kg/m   Outpatient Encounter Medications as of  12/31/2019  Medication Sig  . acetaminophen (TYLENOL) 325 MG tablet Take 650 mg by mouth every 4 (four) hours as needed. Not to exceed 3 grams in 24 hours  . apixaban (ELIQUIS) 5 MG TABS tablet Take 5 mg by mouth 2 (two) times daily.   Marland Kitchen atorvastatin (LIPITOR) 40 MG tablet Take 40 mg by mouth daily.  . Baclofen 5 MG TABS Take 5 mg by mouth 3 (three) times daily.  . bisacodyl (DULCOLAX) 10 MG suppository Place 1 suppository (10 mg total) rectally daily as needed for moderate constipation.  . Cholecalciferol 1.25 MG (50000 UT) capsule Take 50,000 Units by mouth. Once a day on mon and thursday  . coal tar (NEUTROGENA T-GEL) 0.5 % shampoo Apply 1 application topically See admin instructions. Apply to scalp on shower days twice a week Wed And Sat prn  . Dextromethorphan-guaiFENesin (ROBITUSSIN COUGH+CHEST CONG DM PO) Take 10 mLs by mouth every 6 (six) hours as needed.  . gabapentin (NEURONTIN) 100 MG capsule Take 200 mg by mouth at bedtime.   . Menthol, Topical Analgesic, (BIOFREEZE) 4 % GEL Apply 1 application topically 2 (two) times daily as needed. Apply to left shoulder  . metoprolol succinate (TOPROL-XL) 25 MG 24 hr tablet Take 12.5 mg by mouth 2 (two) times daily.  . NON FORMULARY Diet type:   Regular Diet  . omeprazole (PRILOSEC) 40 MG capsule Take 40 mg by mouth daily.   Marland Kitchen senna (SENOKOT) 8.6 MG TABS tablet Take 2 tablets by mouth 2 (two) times daily.    No facility-administered encounter medications on file as of 12/31/2019.     SIGNIFICANT DIAGNOSTIC EXAMS  PREVIOUS:   08-24-18: ct of head: 1. 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 Increased volume loss of the infarcted brain as well as decreased herniation of brain and dura through the craniectomy defect. 2. No new intracranial abnormality.  NO NEW EXAMS.   LABS REVIEWED PREVIOUS:   11-07-18: urine micro-albumin 43.0  11-28-18: glucose 93; bun 16; creat 0.86; k+ 4.4; an++ 140; ca 9.0; liver normal  albumin 3.1; chol 146; ldl 84; trig 114; hdl 39  03-08-19: wbc 9.7; hgb 12.0; hct 41.6; mcv 88.1 plt 193; glucose 97; bun 15; creat 0.79; k+ 4.2; na++ 138; ca 9.1; liver normal albumin 3.1; tsh 1.925 vit D 22.8 05-20-19: wbc 9.1; hgb 12.6; hct 43.6; mcv 87.6 plt 196; chol 151; ldl 84; trig 185; hdl 30 vit D 24.00 PSA 1.60 07-29-19: : glucose 97; bun 11; creat 0.85; k+ 4.1; na++ 140; ca 9.2 liver normal albumin 3.2 hgb a1c 4.6 08-21-19: vit D 24.16  11-07-19: vit D 46.75  TODAY  12-05-19: wbc 10.5; hgb 14.2; hct 47.5; mcv  94.1 plt 158; glucose 84; bun 11; creat 0.80 k+ 4.1; na++ 140; ca 9.2 liver normal albumin 3.1 hgb a1c 4.9 chol 132; ldl 77; trig 110; hdl 33   Review of Systems  Constitutional: Negative for malaise/fatigue.  Respiratory: Negative for cough and shortness of breath.   Cardiovascular: Negative for chest pain, palpitations and leg swelling.  Gastrointestinal: Negative for abdominal pain, constipation and heartburn.  Musculoskeletal: Negative for back pain, joint pain and myalgias.  Skin: Negative.   Neurological: Negative for dizziness.  Psychiatric/Behavioral: The patient is not nervous/anxious.     Physical Exam Constitutional:      General: He is not in acute distress.    Appearance: He is well-developed. He is not diaphoretic.  Neck:     Thyroid: No thyromegaly.  Cardiovascular:     Rate and Rhythm: Normal rate and regular rhythm.     Pulses: Normal pulses.     Heart sounds: Normal heart sounds.  Pulmonary:     Effort: Pulmonary effort is normal. No respiratory distress.     Breath sounds: Normal breath sounds.  Abdominal:     General: Bowel sounds are normal. There is no distension.     Palpations: Abdomen is soft.     Tenderness: There is no abdominal tenderness.  Musculoskeletal:     Cervical back: Neck supple.     Right lower leg: No edema.     Left lower leg: No edema.     Comments:  Left hemiplegia with stiffness present         Lymphadenopathy:      Cervical: No cervical adenopathy.  Skin:    General: Skin is warm and dry.  Neurological:     Mental Status: He is alert. Mental status is at baseline.  Psychiatric:        Mood and Affect: Mood normal.       ASSESSMENT/ PLAN:  TODAY;   1. Diabetic peripheral neuropathy: is stable will continue gabapentin 200 mg nightly   2. Chronic constipation is stable will continue colace twice daily as needed and miralax daily as needed  3. Cerebrovascular accident (CVA) due to embolism of right middle cerebral artery: s/p compression right frontotemporoparietal craniotomy for decompression with duraplasty 2018: is stable will continue eliquis 5 mg twice daily     PREVIOUS   4. Hemiparesis affecting left side as late effect of cerebrovascular accident (CVA) is stable will continue baclofen 5 mg three times daily for spasticity.   5. GERD without esophagitis: is stable will continue prilosec 40 mg daily   6. Dyslipidemia  associated with type 2 diabetes mellitus: is stable LDL 84 will continue lipitor 4 0mg  daily   7. AF (paroxysmal atrial fibrillation) heart rate is stable will continue toprol xl 12.5 mg twice daily; eliquis 5 mg twice daily   8. Hypertension associated with type 2 diabetes mellitus: is stable b/p 129/79 will continue toprol xl 12.5 mg twice daily   9. Type 2 diabetes mellitus with neurological complications: is stable hgg a1c 4.3 diet controlled.      MD is aware of resident's narcotic use and is in agreement with current plan of care. We will attempt to wean resident as appropriate.  Ok Edwards NP [facility] Adult [facility]  Contact [phone] Monday through Friday 8am- 5pm  After hours call [phone]

## 2020-01-02 ENCOUNTER — Non-Acute Institutional Stay (SKILLED_NURSING_FACILITY): Payer: Medicaid Other | Admitting: Adult Health

## 2020-01-02 ENCOUNTER — Encounter: Payer: Self-pay | Admitting: Adult Health

## 2020-01-02 DIAGNOSIS — I10 Essential (primary) hypertension: Secondary | ICD-10-CM

## 2020-01-02 DIAGNOSIS — I69354 Hemiplegia and hemiparesis following cerebral infarction affecting left non-dominant side: Secondary | ICD-10-CM

## 2020-01-02 DIAGNOSIS — E1159 Type 2 diabetes mellitus with other circulatory complications: Secondary | ICD-10-CM | POA: Diagnosis not present

## 2020-01-02 DIAGNOSIS — I63411 Cerebral infarction due to embolism of right middle cerebral artery: Secondary | ICD-10-CM | POA: Diagnosis not present

## 2020-01-02 NOTE — Progress Notes (Signed)
Location:    [facility] Room Number: 117/D Place of Service:  SNF (31)   CODE STATUS: Full Code  Allergies  Allergen Reactions  . Cheese   . Penicillins Hives    Has patient had a PCN reaction causing immediate rash, facial/tongue/throat swelling, SOB or lightheadedness with hypotension: Unknown Has patient had a PCN reaction causing severe rash involving mucus membranes or skin necrosis: Unknown Has patient had a PCN reaction that required hospitalization: No Has patient had a PCN reaction occurring within the last 10 years: No If all of the above answers are "NO", then may proceed with Cephalosporin use.     Chief Complaint  Patient presents with  . Acute Visit    Care Plan Meeting    HPI:  We have come together for his care plan meeting. BIMS 15/15 mood 0/30. He was seen by therapy for wheelchair positioning. No reports of falls. He requires extensive to total care with adls and is incontinent of bladder and bowel. His weight is stable. He continues to be followed for his chronic illnesses including:    Hypertension associated with type 2 diabetes mellitus   Hemiplegia affecting left side as late effect of cerebrovascular accident (CVA)   Cerebrovascular accident due to embolism of right middle cerebral  artery   Past Medical History:  Diagnosis Date  . DM (diabetes mellitus) ([place])   . Hypertension   . Stroke [facility])     Past Surgical History:  Procedure Laterality Date  . CRANIECTOMY Right 04/01/2017   Procedure: RIGHT DECOMPRESSIVE CRANIECTOMY;  Surgeon: Ditty, Kevan Ny, MD;  Location: [facility];  Service: Neurosurgery;  Laterality: Right;  . ESOPHAGOGASTRODUODENOSCOPY N/A 04/13/2017   Procedure: ESOPHAGOGASTRODUODENOSCOPY (EGD);  Surgeon: Georganna Skeans, MD;  Location: [place];  Service: General;  Laterality: N/A;  bedside  . INCISION AND DRAINAGE PERIRECTAL ABSCESS N/A 09/28/2017   Procedure: IRRIGATION AND DEBRIDEMENT PERIANAL ABSCESS;   Surgeon: Virl Cagey, MD;  Location: [facility];  Service: General;  Laterality: N/A;  . PEG PLACEMENT N/A 04/13/2017   Procedure: PERCUTANEOUS ENDOSCOPIC GASTROSTOMY (PEG) PLACEMENT;  Surgeon: Georganna Skeans, MD;  Location: [facility] ENDOSCOPY;  Service: General;  Laterality: N/A;    Social History   Socioeconomic History  . Marital status: Unknown    Spouse name: Not on file  . Number of children: Not on file  . Years of education: Not on file  . Highest education level: Not on file  Occupational History  . Not on file  Tobacco Use  . Smoking status: Former Smoker    Types: Cigarettes  . Smokeless tobacco: Never Used  . Tobacco comment: UTA  Substance and Sexual Activity  . Alcohol use: No    Comment: UTA  . Drug use: No    Comment: UTA  . Sexual activity: Not Currently    Birth control/protection: None  Other Topics Concern  . Not on file  Social History Narrative  . Not on file   Social Determinants of Health   Financial Resource Strain:   . Difficulty of Paying Living Expenses:   Food Insecurity:   . Worried About Charity fundraiser in the Last Year:   . Arboriculturist in the Last Year:   Transportation Needs:   . Film/video editor (Medical):   Marland Kitchen Lack of Transportation (Non-Medical):   Physical Activity:   . Days of Exercise per Week:   . Minutes of Exercise per Session:   Stress:   .  Feeling of Stress :   Social Connections:   . Frequency of Communication with Friends and Family:   . Frequency of Social Gatherings with Friends and Family:   . Attends Religious Services:   . Active Member of Clubs or Organizations:   . Attends Archivist Meetings:   Marland Kitchen Marital Status:   Intimate Partner Violence:   . Fear of Current or Ex-Partner:   . Emotionally Abused:   Marland Kitchen Physically Abused:   . Sexually Abused:    Family History  Problem Relation Age of Onset  . Hypertension Father       VITAL SIGNS BP (!) 85/57   Pulse (!) 51   Temp 98.8 F  (37.1 C) (Oral)   Resp 20   Ht 5\' 6"  (1.676 m)   Wt 181 lb 3.2 oz (82.2 kg)   SpO2 95%   BMI 29.25 kg/m   Outpatient Encounter Medications as of 01/02/2020  Medication Sig  . acetaminophen (TYLENOL) 325 MG tablet Take 650 mg by mouth every 4 (four) hours as needed. Not to exceed 3 grams in 24 hours  . apixaban (ELIQUIS) 5 MG TABS tablet Take 5 mg by mouth 2 (two) times daily.   Marland Kitchen atorvastatin (LIPITOR) 40 MG tablet Take 40 mg by mouth daily.  . Baclofen 5 MG TABS Take 5 mg by mouth 3 (three) times daily.  . bisacodyl (DULCOLAX) 10 MG suppository Place 1 suppository (10 mg total) rectally daily as needed for moderate constipation.  . Cholecalciferol 1.25 MG (50000 UT) capsule Take 50,000 Units by mouth. Once a day on mon and thursday  . coal tar (NEUTROGENA T-GEL) 0.5 % shampoo Apply 1 application topically See admin instructions. Apply to scalp on shower days twice a week Wed And Sat prn  . Dextromethorphan-guaiFENesin (ROBITUSSIN COUGH+CHEST CONG DM PO) Take 10 mLs by mouth every 6 (six) hours as needed.  . gabapentin (NEURONTIN) 100 MG capsule Take 200 mg by mouth at bedtime.   . Menthol, Topical Analgesic, (BIOFREEZE) 4 % GEL Apply 1 application topically 2 (two) times daily. Apply to left shoulder   . metoprolol succinate (TOPROL-XL) 25 MG 24 hr tablet Take 12.5 mg by mouth 2 (two) times daily.  . NON FORMULARY Diet type:   Regular Diet  . omeprazole (PRILOSEC) 40 MG capsule Take 40 mg by mouth daily.   Marland Kitchen senna (SENOKOT) 8.6 MG TABS tablet Take 2 tablets by mouth 2 (two) times daily.    No facility-administered encounter medications on file as of 01/02/2020.     SIGNIFICANT DIAGNOSTIC EXAMS  LABS REVIEWED PREVIOUS:   03-08-19: wbc 9.7; hgb 12.0; hct 41.6; mcv 88.1 plt 193; glucose 97; bun 15; creat 0.79; k+ 4.2; na++ 138; ca 9.1; liver normal albumin 3.1; tsh 1.925 vit D 22.8 05-20-19: wbc 9.1; hgb 12.6; hct 43.6; mcv 87.6 plt 196; chol 151; ldl 84; trig 185; hdl 30 vit D 24.00  PSA 1.60 07-29-19: : glucose 97; bun 11; creat 0.85; k+ 4.1; na++ 140; ca 9.2 liver normal albumin 3.2 hgb a1c 4.6 08-21-19: vit D 24.16  11-07-19: vit D 46.75 12-05-19: wbc 10.5; hgb 14.2; hct 47.5; mcv 94.1 plt 158; glucose 84; bun 11; creat 0.80 k+ 4.1; na++ 140; ca 9.2 liver normal albumin 3.1 hgb a1c 4.9 chol 132; ldl 77; trig 110; hdl 33   NO NEW LABS.   Review of Systems  Constitutional: Negative for malaise/fatigue.  Respiratory: Negative for cough and shortness of breath.   Cardiovascular: Negative  for chest pain, palpitations and leg swelling.  Gastrointestinal: Negative for abdominal pain, constipation and heartburn.  Musculoskeletal: Negative for back pain, joint pain and myalgias.  Skin: Negative.   Neurological: Negative for dizziness.  Psychiatric/Behavioral: The patient is not nervous/anxious.     Physical Exam Constitutional:      General: He is not in acute distress.    Appearance: He is well-developed. He is not diaphoretic.  Neck:     Thyroid: No thyromegaly.  Cardiovascular:     Rate and Rhythm: Normal rate and regular rhythm.     Pulses: Normal pulses.     Heart sounds: Normal heart sounds.  Pulmonary:     Effort: Pulmonary effort is normal. No respiratory distress.     Breath sounds: Normal breath sounds.  Abdominal:     General: Bowel sounds are normal. There is no distension.     Palpations: Abdomen is soft.     Tenderness: There is no abdominal tenderness.  Musculoskeletal:     Cervical back: Neck supple.     Right lower leg: No edema.     Left lower leg: No edema.     Comments:  Left hemiplegia with stiffness present  Lymphadenopathy:     Cervical: No cervical adenopathy.  Skin:    General: Skin is warm and dry.  Neurological:     Mental Status: He is alert and oriented to person, place, and time.  Psychiatric:        Mood and Affect: Mood normal.      ASSESSMENT/ PLAN:  TODAY  1. Hypertension associated with type 2 diabetes mellitus 2.  Hemiplegia affecting left side as late effect of cerebrovascular accident (CVA) 3. Cerebrovascular accident due to embolism of right middle cerebral  artery   Will continue current medications Will continue current plan of care Will continue to monitor his status.     MD is aware of resident's narcotic use and is in agreement with current plan of care. We will attempt to wean resident as appropriate.  Ok Edwards NP [facility]  Contact [phone] Monday through Friday 8am- 5pm  After hours call [phone]

## 2020-01-09 ENCOUNTER — Other Ambulatory Visit (HOSPITAL_COMMUNITY)
Admission: RE | Admit: 2020-01-09 | Discharge: 2020-01-09 | Disposition: A | Discharge status: Home / Self Care | Payer: Medicaid Other | Source: Skilled Nursing Facility | Attending: Adult Health | Admitting: Adult Health

## 2020-01-09 DIAGNOSIS — E559 Vitamin D deficiency, unspecified: Secondary | ICD-10-CM | POA: Insufficient documentation

## 2020-01-09 LAB — VITAMIN D 25 HYDROXY (VIT D DEFICIENCY, FRACTURES): Vit D, 25-Hydroxy: 43.63 ng/mL (ref 30–100)

## 2020-01-28 ENCOUNTER — Encounter: Payer: Self-pay | Admitting: Adult Health

## 2020-01-28 ENCOUNTER — Non-Acute Institutional Stay (SKILLED_NURSING_FACILITY): Payer: Medicaid Other | Admitting: Adult Health

## 2020-01-28 DIAGNOSIS — E1169 Type 2 diabetes mellitus with other specified complication: Secondary | ICD-10-CM

## 2020-01-28 DIAGNOSIS — E785 Hyperlipidemia, unspecified: Secondary | ICD-10-CM

## 2020-01-28 DIAGNOSIS — I69354 Hemiplegia and hemiparesis following cerebral infarction affecting left non-dominant side: Secondary | ICD-10-CM

## 2020-01-28 DIAGNOSIS — K219 Gastro-esophageal reflux disease without esophagitis: Secondary | ICD-10-CM | POA: Diagnosis not present

## 2020-01-28 NOTE — Progress Notes (Signed)
Location:    [facility] Room Number: 117/D Place of Service:  SNF (31)   CODE STATUS: Full Code  Allergies  Allergen Reactions  . Cheese   . Penicillins Hives    Has patient had a PCN reaction causing immediate rash, facial/tongue/throat swelling, SOB or lightheadedness with hypotension: Unknown Has patient had a PCN reaction causing severe rash involving mucus membranes or skin necrosis: Unknown Has patient had a PCN reaction that required hospitalization: No Has patient had a PCN reaction occurring within the last 10 years: No If all of the above answers are "NO", then may proceed with Cephalosporin use.     Chief Complaint  Patient presents with  . Medical Management of Chronic Issues         Hemiplegia affecting left side as late effect of cerebrovascular accident (CVA)   GERD without esophagitis:   Dyslipidemia associated with type 2 diabetes mellitus     HPI:  He is a 59 year old long term resident of this facility being seen for the management of his chronic illnesses: hemiplegia; gerd; dyslipidemia. There are no reports of uncontrolled pain; no changes in appetite; no constipation; no heart burn.   Past Medical History:  Diagnosis Date  . DM (diabetes mellitus) ([place])   . Hypertension   . Stroke [facility])     Past Surgical History:  Procedure Laterality Date  . CRANIECTOMY Right 04/01/2017   Procedure: RIGHT DECOMPRESSIVE CRANIECTOMY;  Surgeon: Ditty, Kevan Ny, MD;  Location: [place];  Service: Neurosurgery;  Laterality: Right;  . ESOPHAGOGASTRODUODENOSCOPY N/A 04/13/2017   Procedure: ESOPHAGOGASTRODUODENOSCOPY (EGD);  Surgeon: Georganna Skeans, MD;  Location: [place];  Service: General;  Laterality: N/A;  bedside  . INCISION AND DRAINAGE PERIRECTAL ABSCESS N/A 09/28/2017   Procedure: IRRIGATION AND DEBRIDEMENT PERIANAL ABSCESS;  Surgeon: Virl Cagey, MD;  Location: [facility];  Service: General;  Laterality: N/A;  . PEG PLACEMENT N/A  04/13/2017   Procedure: PERCUTANEOUS ENDOSCOPIC GASTROSTOMY (PEG) PLACEMENT;  Surgeon: Georganna Skeans, MD;  Location: [facility] ENDOSCOPY;  Service: General;  Laterality: N/A;    Social History   Socioeconomic History  . Marital status: Unknown    Spouse name: Not on file  . Number of children: Not on file  . Years of education: Not on file  . Highest education level: Not on file  Occupational History  . Not on file  Tobacco Use  . Smoking status: Former Smoker    Types: Cigarettes  . Smokeless tobacco: Never Used  . Tobacco comment: UTA  Vaping Use  . Vaping Use: Never used  Substance and Sexual Activity  . Alcohol use: No    Comment: UTA  . Drug use: No    Comment: UTA  . Sexual activity: Not Currently    Birth control/protection: None  Other Topics Concern  . Not on file  Social History Narrative  . Not on file   Social Determinants of Health   Financial Resource Strain:   . Difficulty of Paying Living Expenses:   Food Insecurity:   . Worried About Charity fundraiser in the Last Year:   . Arboriculturist in the Last Year:   Transportation Needs:   . Film/video editor (Medical):   Marland Kitchen Lack of Transportation (Non-Medical):   Physical Activity:   . Days of Exercise per Week:   . Minutes of Exercise per Session:   Stress:   . Feeling of Stress :   Social Connections:   .  Frequency of Communication with Friends and Family:   . Frequency of Social Gatherings with Friends and Family:   . Attends Religious Services:   . Active Member of Clubs or Organizations:   . Attends Archivist Meetings:   Marland Kitchen Marital Status:   Intimate Partner Violence:   . Fear of Current or Ex-Partner:   . Emotionally Abused:   Marland Kitchen Physically Abused:   . Sexually Abused:    Family History  Problem Relation Age of Onset  . Hypertension Father       VITAL SIGNS BP 106/66   Pulse 70   Temp 98.4 F (36.9 C) (Oral)   Resp 20   Ht 5\' 6"  (1.676 m)   Wt 182 lb 3.2 oz (82.6 kg)    BMI 29.41 kg/m   Outpatient Encounter Medications as of 01/28/2020  Medication Sig  . acetaminophen (TYLENOL) 325 MG tablet Take 650 mg by mouth every 4 (four) hours as needed. Not to exceed 3 grams in 24 hours  . apixaban (ELIQUIS) 5 MG TABS tablet Take 5 mg by mouth 2 (two) times daily.   Marland Kitchen atorvastatin (LIPITOR) 40 MG tablet Take 40 mg by mouth daily.  . Baclofen 5 MG TABS Take 5 mg by mouth 3 (three) times daily.  . bisacodyl (DULCOLAX) 10 MG suppository Place 1 suppository (10 mg total) rectally daily as needed for moderate constipation.  . Cholecalciferol 1.25 MG (50000 UT) capsule Take 50,000 Units by mouth daily. Once a day on mon  . coal tar (NEUTROGENA T-GEL) 0.5 % shampoo Apply 1 application topically See admin instructions. Apply to scalp on shower days twice a week Wed And Sat prn  . Dextromethorphan-guaiFENesin (ROBITUSSIN COUGH+CHEST CONG DM PO) Take 10 mLs by mouth every 6 (six) hours as needed.  . gabapentin (NEURONTIN) 100 MG capsule Take 200 mg by mouth at bedtime.   . Menthol, Topical Analgesic, (BIOFREEZE) 4 % GEL Apply 1 application topically 2 (two) times daily. Apply to left shoulder   . metoprolol succinate (TOPROL-XL) 25 MG 24 hr tablet Take 12.5 mg by mouth 2 (two) times daily.  . NON FORMULARY Diet type:   Regular Diet  . omeprazole (PRILOSEC) 40 MG capsule Take 40 mg by mouth daily.   Marland Kitchen senna (SENOKOT) 8.6 MG TABS tablet Take 2 tablets by mouth 2 (two) times daily.    No facility-administered encounter medications on file as of 01/28/2020.     SIGNIFICANT DIAGNOSTIC EXAMS   PREVIOUS:   08-24-18: ct of head: 1. 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 Increased volume loss of the infarcted brain as well as decreased herniation of brain and dura through the craniectomy defect. 2. No new intracranial abnormality.  NO NEW EXAMS.   LABS REVIEWED PREVIOUS:   03-08-19: wbc 9.7; hgb 12.0; hct 41.6; mcv 88.1 plt 193; glucose 97;  bun 15; creat 0.79; k+ 4.2; na++ 138; ca 9.1; liver normal albumin 3.1; tsh 1.925 vit D 22.8 05-20-19: wbc 9.1; hgb 12.6; hct 43.6; mcv 87.6 plt 196; chol 151; ldl 84; trig 185; hdl 30 vit D 24.00 PSA 1.60 07-29-19: : glucose 97; bun 11; creat 0.85; k+ 4.1; na++ 140; ca 9.2 liver normal albumin 3.2 hgb a1c 4.6 08-21-19: vit D 24.16  11-07-19: vit D 46.75 12-05-19: wbc 10.5; hgb 14.2; hct 47.5; mcv 94.1 plt 158; glucose 84; bun 11; creat 0.80 k+ 4.1; na++ 140; ca 9.2 liver normal albumin 3.1 hgb a1c 4.9 chol 132; ldl 77;  trig 110; hdl 33   NO NEW LABS.   Review of Systems  Constitutional: Negative for malaise/fatigue.  Respiratory: Negative for cough and shortness of breath.   Cardiovascular: Negative for chest pain, palpitations and leg swelling.  Gastrointestinal: Negative for abdominal pain, constipation and heartburn.  Musculoskeletal: Negative for back pain, joint pain and myalgias.  Skin: Negative.   Neurological: Negative for dizziness.  Psychiatric/Behavioral: The patient is not nervous/anxious.       Physical Exam Constitutional:      General: He is not in acute distress.    Appearance: He is well-developed. He is not diaphoretic.  Neck:     Thyroid: No thyromegaly.  Cardiovascular:     Rate and Rhythm: Normal rate and regular rhythm.     Pulses: Normal pulses.     Heart sounds: Normal heart sounds.  Pulmonary:     Effort: Pulmonary effort is normal. No respiratory distress.     Breath sounds: Normal breath sounds.  Abdominal:     General: Bowel sounds are normal. There is no distension.     Palpations: Abdomen is soft.     Tenderness: There is no abdominal tenderness.  Musculoskeletal:     Cervical back: Neck supple.     Right lower leg: No edema.     Left lower leg: No edema.     Comments: Left hemiplegia with stiffness present   Lymphadenopathy:     Cervical: No cervical adenopathy.  Skin:    General: Skin is warm and dry.  Neurological:     Mental Status: He is  alert and oriented to person, place, and time.  Psychiatric:        Mood and Affect: Mood normal.        ASSESSMENT/ PLAN:  TODAY;   1. Hemiplegia affecting left side as late effect of cerebrovascular accident (CVA) is stable will continue a=baclofen 5 mg three times daily   2. GERD without esophagitis: is stable will continue prilosec 40 mg daily   3. Dyslipidemia associated with type 2 diabetes mellitus is stable LDL 84 will continue lipitor 40 mg daily    PREVIOUS   4. AF (paroxysmal atrial fibrillation) heart rate is stable will continue toprol xl 12.5 mg twice daily; eliquis 5 mg twice daily   5. Hypertension associated with type 2 diabetes mellitus: is stable b/p 129/79 will continue toprol xl 12.5 mg twice daily   6. Type 2 diabetes mellitus with neurological complications: is stable hgg a1c 4.3 diet controlled.   7. Diabetic peripheral neuropathy: is stable will continue gabapentin 200 mg nightly   8. Chronic constipation is stable will continue colace twice daily as needed and miralax daily as needed  9. Cerebrovascular accident (CVA) due to embolism of right middle cerebral artery: s/p compression right frontotemporoparietal craniotomy for decompression with duraplasty 2018: is stable will continue eliquis 5 mg twice daily            MD is aware of resident's narcotic use and is in agreement with current plan of care. We will attempt to wean resident as appropriate.  Ok Edwards NP [facility]  Contact [phone] Monday through Friday 8am- 5pm  After hours call [phone]

## 2020-02-06 ENCOUNTER — Other Ambulatory Visit (HOSPITAL_COMMUNITY)
Admission: RE | Admit: 2020-02-06 | Discharge: 2020-02-06 | Disposition: A | Discharge status: Home / Self Care | Payer: Medicaid Other | Source: Skilled Nursing Facility | Attending: Adult Health | Admitting: Adult Health

## 2020-02-06 DIAGNOSIS — E1140 Type 2 diabetes mellitus with diabetic neuropathy, unspecified: Secondary | ICD-10-CM | POA: Diagnosis not present

## 2020-02-07 LAB — MICROALBUMIN, URINE: Microalb, Ur: 27.4 ug/mL — ABNORMAL HIGH

## 2020-02-21 ENCOUNTER — Non-Acute Institutional Stay (SKILLED_NURSING_FACILITY): Payer: Medicaid Other | Admitting: Adult Health

## 2020-02-21 ENCOUNTER — Encounter: Payer: Self-pay | Admitting: Adult Health

## 2020-02-21 DIAGNOSIS — E1159 Type 2 diabetes mellitus with other circulatory complications: Secondary | ICD-10-CM

## 2020-02-21 DIAGNOSIS — I480 Paroxysmal atrial fibrillation: Secondary | ICD-10-CM

## 2020-02-21 DIAGNOSIS — E1149 Type 2 diabetes mellitus with other diabetic neurological complication: Secondary | ICD-10-CM

## 2020-02-21 DIAGNOSIS — I10 Essential (primary) hypertension: Secondary | ICD-10-CM | POA: Diagnosis not present

## 2020-02-21 NOTE — Progress Notes (Signed)
Location:    [place] [place] Room Number: 117/D Place of Service:  SNF (31)   CODE STATUS: Full Code  Allergies  Allergen Reactions   Cheese    Penicillins Hives    Has patient had a PCN reaction causing immediate rash, facial/tongue/throat swelling, SOB or lightheadedness with hypotension: Unknown Has patient had a PCN reaction causing severe rash involving mucus membranes or skin necrosis: Unknown Has patient had a PCN reaction that required hospitalization: No Has patient had a PCN reaction occurring within the last 10 years: No If all of the above answers are "NO", then may proceed with Cephalosporin use.     Chief Complaint  Patient presents with   Medical Management of Chronic Issues         AF (paroxsymal atrial fibrillation)   Hypertension associated with type 2 diabetes mellitus;  Type 2 diabetes mellitus with neurological complications     HPI:  He is a 59 year old long term resident of this facility being seen for the management of his chronic illnesses: AF (paroxsymal atrial fibrillation)   Hypertension associated with type 2 diabetes mellitus; Type 2 diabetes mellitus with neurological complications    There are no reports of uncontrolled pain; no heart palpitations; no reports of anxiety or agitation.   Past Medical History:  Diagnosis Date   DM (diabetes mellitus) ([place])    Hypertension    Stroke [facility])     Past Surgical History:  Procedure Laterality Date   CRANIECTOMY Right 04/01/2017   Procedure: RIGHT DECOMPRESSIVE CRANIECTOMY;  Surgeon: Ditty, Kevan Ny, MD;  Location: [place];  Service: Neurosurgery;  Laterality: Right;   ESOPHAGOGASTRODUODENOSCOPY N/A 04/13/2017   Procedure: ESOPHAGOGASTRODUODENOSCOPY (EGD);  Surgeon: Georganna Skeans, MD;  Location: [place];  Service: General;  Laterality: N/A;  bedside   INCISION AND DRAINAGE PERIRECTAL ABSCESS N/A 09/28/2017   Procedure: IRRIGATION AND DEBRIDEMENT PERIANAL ABSCESS;   Surgeon: Virl Cagey, MD;  Location: [facility];  Service: General;  Laterality: N/A;   PEG PLACEMENT N/A 04/13/2017   Procedure: PERCUTANEOUS ENDOSCOPIC GASTROSTOMY (PEG) PLACEMENT;  Surgeon: Georganna Skeans, MD;  Location: [facility] ENDOSCOPY;  Service: General;  Laterality: N/A;    Social History   Socioeconomic History   Marital status: Unknown    Spouse name: Not on file   Number of children: Not on file   Years of education: Not on file   Highest education level: Not on file  Occupational History   Not on file  Tobacco Use   Smoking status: Former Smoker    Types: Cigarettes   Smokeless tobacco: Never Used   Tobacco comment: UTA  Vaping Use   Vaping Use: Never used  Substance and Sexual Activity   Alcohol use: No    Comment: UTA   Drug use: No    Comment: UTA   Sexual activity: Not Currently    Birth control/protection: None  Other Topics Concern   Not on file  Social History Narrative   Not on file   Social Determinants of Health   Financial Resource Strain:    Difficulty of Paying Living Expenses:   Food Insecurity:    Worried About Charity fundraiser in the Last Year:    Arboriculturist in the Last Year:   Transportation Needs:    Film/video editor (Medical):    Lack of Transportation (Non-Medical):   Physical Activity:    Days of Exercise per Week:    Minutes of Exercise per  Session:   Stress:    Feeling of Stress :   Social Connections:    Frequency of Communication with Friends and Family:    Frequency of Social Gatherings with Friends and Family:    Attends Religious Services:    Active Member of Clubs or Organizations:    Attends Music therapist:    Marital Status:   Intimate Partner Violence:    Fear of Current or Ex-Partner:    Emotionally Abused:    Physically Abused:    Sexually Abused:    Family History  Problem Relation Age of Onset   Hypertension Father       VITAL SIGNS BP  112/62    Pulse 80    Temp 98.4 F (36.9 C) (Oral)    Ht 5\' 6"  (1.676 m)    Wt 178 lb 6.4 oz (80.9 kg)    BMI 28.79 kg/m   Outpatient Encounter Medications as of 02/21/2020  Medication Sig   acetaminophen (TYLENOL) 325 MG tablet Take 650 mg by mouth every 4 (four) hours as needed. Not to exceed 3 grams in 24 hours   apixaban (ELIQUIS) 5 MG TABS tablet Take 5 mg by mouth 2 (two) times daily.    atorvastatin (LIPITOR) 40 MG tablet Take 40 mg by mouth daily.   Baclofen 5 MG TABS Take 5 mg by mouth 3 (three) times daily.   bisacodyl (DULCOLAX) 10 MG suppository Place 1 suppository (10 mg total) rectally daily as needed for moderate constipation.   Cholecalciferol 1.25 MG (50000 UT) capsule Take 50,000 Units by mouth daily. Once a day on mon   coal tar (NEUTROGENA T-GEL) 0.5 % shampoo Apply 1 application topically See admin instructions. Apply to scalp on shower days twice a week Wed And Sat prn   Dextromethorphan-guaiFENesin (ROBITUSSIN COUGH+CHEST CONG DM PO) Take 10 mLs by mouth every 6 (six) hours as needed.   gabapentin (NEURONTIN) 100 MG capsule Take 200 mg by mouth at bedtime.    Menthol, Topical Analgesic, (BIOFREEZE) 4 % GEL Apply 1 application topically 2 (two) times daily. Apply to left shoulder    metoprolol succinate (TOPROL-XL) 25 MG 24 hr tablet Take 12.5 mg by mouth 2 (two) times daily.   NON FORMULARY Diet type:   Regular Diet   omeprazole (PRILOSEC) 40 MG capsule Take 40 mg by mouth daily.    senna (SENOKOT) 8.6 MG TABS tablet Take 2 tablets by mouth 2 (two) times daily.    No facility-administered encounter medications on file as of 02/21/2020.     SIGNIFICANT DIAGNOSTIC EXAMS   PREVIOUS:   08-24-18: ct of head: 1. 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 Increased volume loss of the infarcted brain as well as decreased herniation of brain and dura through the craniectomy defect. 2. No new intracranial abnormality.  NO NEW  EXAMS.   LABS REVIEWED PREVIOUS:   03-08-19: wbc 9.7; hgb 12.0; hct 41.6; mcv 88.1 plt 193; glucose 97; bun 15; creat 0.79; k+ 4.2; na++ 138; ca 9.1; liver normal albumin 3.1; tsh 1.925 vit D 22.8 05-20-19: wbc 9.1; hgb 12.6; hct 43.6; mcv 87.6 plt 196; chol 151; ldl 84; trig 185; hdl 30 vit D 24.00 PSA 1.60 07-29-19: : glucose 97; bun 11; creat 0.85; k+ 4.1; na++ 140; ca 9.2 liver normal albumin 3.2 hgb a1c 4.6 08-21-19: vit D 24.16  11-07-19: vit D 46.75 12-05-19: wbc 10.5; hgb 14.2; hct 47.5; mcv 94.1 plt 158; glucose 84; bun 11;  creat 0.80 k+ 4.1; na++ 140; ca 9.2 liver normal albumin 3.1 hgb a1c 4.9 chol 132; ldl 77; trig 110; hdl 33   NO NEW LABS.   Review of Systems  Constitutional: Negative for malaise/fatigue.  Respiratory: Negative for cough and shortness of breath.   Cardiovascular: Negative for chest pain, palpitations and leg swelling.  Gastrointestinal: Negative for abdominal pain, constipation and heartburn.  Musculoskeletal: Negative for back pain, joint pain and myalgias.  Skin: Negative.   Neurological: Negative for dizziness.  Psychiatric/Behavioral: The patient is not nervous/anxious.     Physical Exam Constitutional:      General: He is not in acute distress.    Appearance: He is well-developed. He is not diaphoretic.  Neck:     Thyroid: No thyromegaly.  Cardiovascular:     Rate and Rhythm: Normal rate and regular rhythm.     Pulses: Normal pulses.     Heart sounds: Normal heart sounds.  Pulmonary:     Effort: Pulmonary effort is normal. No respiratory distress.     Breath sounds: Normal breath sounds.  Abdominal:     General: Bowel sounds are normal. There is no distension.     Palpations: Abdomen is soft.     Tenderness: There is no abdominal tenderness.  Musculoskeletal:     Cervical back: Neck supple.     Right lower leg: No edema.     Left lower leg: No edema.     Comments: Left hemiplegia with stiffness present    Lymphadenopathy:     Cervical: No  cervical adenopathy.  Skin:    General: Skin is warm and dry.  Neurological:     Mental Status: He is alert. Mental status is at baseline.  Psychiatric:        Mood and Affect: Mood normal.       ASSESSMENT/ PLAN:  TODAY;   1. AF (paroxsymal atrial fibrillation) heart rate is stable will continue toprol xl 12.5 mg twice daily for rate control eliquis 5 mg twice daily   2. Hypertension associated with type 2 diabetes mellitus; is stable b/p 112/62 will continue toprol xl 12.5 mg twice daily   3. Type 2 diabetes mellitus with neurological complications is stable hgb a1c 4.3 diet controlled.   PREVIOUS   4. Diabetic peripheral neuropathy: is stable will continue gabapentin 200 mg nightly   5. Chronic constipation is stable will continue colace twice daily as needed and miralax daily as needed  6. Cerebrovascular accident (CVA) due to embolism of right middle cerebral artery: s/p compression right frontotemporoparietal craniotomy for decompression with duraplasty 2018: is stable will continue eliquis 5 mg twice daily    7. Hemiplegia affecting left side as late effect of cerebrovascular accident (CVA) is stable will continue baclofen 5 mg three times daily   8. GERD without esophagitis: is stable will continue prilosec 40 mg daily   9. Dyslipidemia associated with type 2 diabetes mellitus is stable LDL 84 will continue lipitor 40 mg daily         MD is aware of resident's narcotic use and is in agreement with current plan of care. We will attempt to wean resident as appropriate.  Ok Edwards NP [facility]  Contact [phone] Monday through Friday 8am- 5pm  After hours call [phone]

## 2020-03-13 ENCOUNTER — Other Ambulatory Visit (HOSPITAL_COMMUNITY)
Admission: RE | Admit: 2020-03-13 | Discharge: 2020-03-13 | Disposition: A | Discharge status: Home / Self Care | Payer: Medicaid Other | Source: Skilled Nursing Facility | Attending: Adult Health | Admitting: Adult Health

## 2020-03-13 DIAGNOSIS — E559 Vitamin D deficiency, unspecified: Secondary | ICD-10-CM | POA: Diagnosis not present

## 2020-03-13 DIAGNOSIS — I63311 Cerebral infarction due to thrombosis of right middle cerebral artery: Secondary | ICD-10-CM | POA: Diagnosis not present

## 2020-03-13 DIAGNOSIS — I10 Essential (primary) hypertension: Secondary | ICD-10-CM | POA: Insufficient documentation

## 2020-03-13 LAB — COMPREHENSIVE METABOLIC PANEL
ALT: 26 U/L (ref 0–44)
AST: 13 U/L — ABNORMAL LOW (ref 15–41)
Albumin: 3.1 g/dL — ABNORMAL LOW (ref 3.5–5.0)
Alkaline Phosphatase: 58 U/L (ref 38–126)
Anion gap: 7 (ref 5–15)
BUN: 12 mg/dL (ref 6–20)
CO2: 24 mmol/L (ref 22–32)
Calcium: 8.8 mg/dL — ABNORMAL LOW (ref 8.9–10.3)
Chloride: 105 mmol/L (ref 98–111)
Creatinine, Ser: 0.67 mg/dL (ref 0.61–1.24)
GFR calc Af Amer: >60 mL/min (ref >60)
GFR calc non Af Amer: >60 mL/min (ref >60)
Glucose, Bld: 116 mg/dL — ABNORMAL HIGH (ref 70–99)
Potassium: 3.8 mmol/L (ref 3.5–5.1)
Sodium: 136 mmol/L (ref 135–145)
Total Bilirubin: 0.5 mg/dL (ref 0.3–1.2)
Total Protein: 6.6 g/dL (ref 6.5–8.1)

## 2020-03-13 LAB — VITAMIN D 25 HYDROXY (VIT D DEFICIENCY, FRACTURES): Vit D, 25-Hydroxy: 29.72 ng/mL — ABNORMAL LOW (ref 30–100)

## 2020-03-16 ENCOUNTER — Ambulatory Visit: Payer: Medicaid Other | Admitting: "Endocrinology

## 2020-03-25 ENCOUNTER — Encounter: Payer: Self-pay | Admitting: Adult Health

## 2020-03-25 ENCOUNTER — Non-Acute Institutional Stay (SKILLED_NURSING_FACILITY): Payer: Medicaid Other | Admitting: Adult Health

## 2020-03-25 DIAGNOSIS — K5909 Other constipation: Secondary | ICD-10-CM

## 2020-03-25 DIAGNOSIS — E1142 Type 2 diabetes mellitus with diabetic polyneuropathy: Secondary | ICD-10-CM

## 2020-03-25 DIAGNOSIS — I63411 Cerebral infarction due to embolism of right middle cerebral artery: Secondary | ICD-10-CM | POA: Diagnosis not present

## 2020-03-25 NOTE — Progress Notes (Signed)
Location:    [place] Room Number: 117/D Place of Service:  SNF (31)   CODE STATUS: Full Code  Allergies  Allergen Reactions  . Cheese   . Penicillins Hives    Has patient had a PCN reaction causing immediate rash, facial/tongue/throat swelling, SOB or lightheadedness with hypotension: Unknown Has patient had a PCN reaction causing severe rash involving mucus membranes or skin necrosis: Unknown Has patient had a PCN reaction that required hospitalization: No Has patient had a PCN reaction occurring within the last 10 years: No If all of the above answers are "NO", then may proceed with Cephalosporin use.     Chief Complaint  Patient presents with  . Medical Management of Chronic Issues          Diabetic peripheral neuropathy:  Chronic constipation: Cerebrovascular accident (CVA) due to embolism of right middle cerebral artery    HPI:  He is a 59 year old long term resident of this facility being seen for the management of her chronic illnesses; Diabetic peripheral neuropathy:    Chronic constipation: Cerebrovascular accident (CVA) due to embolism of right middle cerebral artery. There are no reports of uncontrolled pain; no reports of agitation or anxiety no reports of constipation.   Past Medical History:  Diagnosis Date  . DM (diabetes mellitus) ([place])   . Hypertension   . Stroke [facility])     Past Surgical History:  Procedure Laterality Date  . CRANIECTOMY Right 04/01/2017   Procedure: RIGHT DECOMPRESSIVE CRANIECTOMY;  Surgeon: Ditty, Kevan Ny, MD;  Location: [place];  Service: Neurosurgery;  Laterality: Right;  . ESOPHAGOGASTRODUODENOSCOPY N/A 04/13/2017   Procedure: ESOPHAGOGASTRODUODENOSCOPY (EGD);  Surgeon: Georganna Skeans, MD;  Location: [place];  Service: General;  Laterality: N/A;  bedside  . INCISION AND DRAINAGE PERIRECTAL ABSCESS N/A 09/28/2017   Procedure: IRRIGATION AND DEBRIDEMENT PERIANAL ABSCESS;  Surgeon: Virl Cagey, MD;   Location: [facility];  Service: General;  Laterality: N/A;  . PEG PLACEMENT N/A 04/13/2017   Procedure: PERCUTANEOUS ENDOSCOPIC GASTROSTOMY (PEG) PLACEMENT;  Surgeon: Georganna Skeans, MD;  Location: [facility] ENDOSCOPY;  Service: General;  Laterality: N/A;    Social History   Socioeconomic History  . Marital status: Unknown    Spouse name: Not on file  . Number of children: Not on file  . Years of education: Not on file  . Highest education level: Not on file  Occupational History  . Not on file  Tobacco Use  . Smoking status: Former Smoker    Types: Cigarettes  . Smokeless tobacco: Never Used  . Tobacco comment: UTA  Vaping Use  . Vaping Use: Never used  Substance and Sexual Activity  . Alcohol use: No    Comment: UTA  . Drug use: No    Comment: UTA  . Sexual activity: Not Currently    Birth control/protection: None  Other Topics Concern  . Not on file  Social History Narrative  . Not on file   Social Determinants of Health   Financial Resource Strain:   . Difficulty of Paying Living Expenses:   Food Insecurity:   . Worried About Charity fundraiser in the Last Year:   . Arboriculturist in the Last Year:   Transportation Needs:   . Film/video editor (Medical):   Marland Kitchen Lack of Transportation (Non-Medical):   Physical Activity:   . Days of Exercise per Week:   . Minutes of Exercise per Session:   Stress:   .  Feeling of Stress :   Social Connections:   . Frequency of Communication with Friends and Family:   . Frequency of Social Gatherings with Friends and Family:   . Attends Religious Services:   . Active Member of Clubs or Organizations:   . Attends Archivist Meetings:   Marland Kitchen Marital Status:   Intimate Partner Violence:   . Fear of Current or Ex-Partner:   . Emotionally Abused:   Marland Kitchen Physically Abused:   . Sexually Abused:    Family History  Problem Relation Age of Onset  . Hypertension Father       VITAL SIGNS BP 119/66   Pulse 68   Temp 98.2 F  (36.8 C) (Oral)   Resp 20   Ht 5\' 6"  (1.676 m)   Wt 178 lb 12.8 oz (81.1 kg)   SpO2 95%   BMI 28.86 kg/m   Outpatient Encounter Medications as of 03/25/2020  Medication Sig  . acetaminophen (TYLENOL) 325 MG tablet Take 650 mg by mouth every 4 (four) hours as needed. Not to exceed 3 grams in 24 hours  . Amino Acids-Protein Hydrolys (FEEDING SUPPLEMENT, PRO-STAT 64,) LIQD Take 30 mLs by mouth daily.  Marland Kitchen apixaban (ELIQUIS) 5 MG TABS tablet Take 5 mg by mouth 2 (two) times daily.   Marland Kitchen atorvastatin (LIPITOR) 40 MG tablet Take 40 mg by mouth daily.  . Baclofen 5 MG TABS Take 5 mg by mouth 3 (three) times daily.  . bisacodyl (DULCOLAX) 10 MG suppository Place 1 suppository (10 mg total) rectally daily as needed for moderate constipation.  . Cholecalciferol 1.25 MG (50000 UT) capsule Take 50,000 Units by mouth daily. Once a day on mon  . coal tar (NEUTROGENA T-GEL) 0.5 % shampoo Apply 1 application topically See admin instructions. Apply to scalp on shower days twice a week Wed And Sat prn  . Dextromethorphan-guaiFENesin (ROBITUSSIN COUGH+CHEST CONG DM PO) Take 10 mLs by mouth every 6 (six) hours as needed.  . gabapentin (NEURONTIN) 100 MG capsule Take 200 mg by mouth at bedtime.   . Menthol, Topical Analgesic, (BIOFREEZE) 4 % GEL Apply 1 application topically 2 (two) times daily. Apply to left shoulder   . metoprolol succinate (TOPROL-XL) 25 MG 24 hr tablet Take 12.5 mg by mouth 2 (two) times daily.  . NON FORMULARY Diet type:   Regular Diet  . omeprazole (PRILOSEC) 40 MG capsule Take 40 mg by mouth daily.   Marland Kitchen senna (SENOKOT) 8.6 MG TABS tablet Take 2 tablets by mouth 2 (two) times daily.    No facility-administered encounter medications on file as of 03/25/2020.     SIGNIFICANT DIAGNOSTIC EXAMS   PREVIOUS:   08-24-18: ct of head: 1. 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 Increased volume loss of the infarcted brain as well as decreased herniation of brain  and dura through the craniectomy defect. 2. No new intracranial abnormality.  NO NEW EXAMS.   LABS REVIEWED PREVIOUS:   05-20-19: wbc 9.1; hgb 12.6; hct 43.6; mcv 87.6 plt 196; chol 151; ldl 84; trig 185; hdl 30 vit D 24.00 PSA 1.60 07-29-19: : glucose 97; bun 11; creat 0.85; k+ 4.1; na++ 140; ca 9.2 liver normal albumin 3.2 hgb a1c 4.6 08-21-19: vit D 24.16  11-07-19: vit D 46.75 12-05-19: wbc 10.5; hgb 14.2; hct 47.5; mcv 94.1 plt 158; glucose 84; bun 11; creat 0.80 k+ 4.1; na++ 140; ca 9.2 liver normal albumin 3.1 hgb a1c 4.9 chol 132; ldl 77; trig  110; hdl 33   NO NEW LABS.   Review of Systems  Constitutional: Negative for malaise/fatigue.  Respiratory: Negative for cough and shortness of breath.   Cardiovascular: Negative for chest pain, palpitations and leg swelling.  Gastrointestinal: Negative for abdominal pain, constipation and heartburn.  Musculoskeletal: Negative for back pain, joint pain and myalgias.  Skin: Negative.   Neurological: Negative for dizziness.  Psychiatric/Behavioral: The patient is not nervous/anxious.      Physical Exam Constitutional:      General: He is not in acute distress.    Appearance: He is well-developed. He is not diaphoretic.  Neck:     Thyroid: No thyromegaly.  Cardiovascular:     Rate and Rhythm: Normal rate and regular rhythm.     Pulses: Normal pulses.     Heart sounds: Normal heart sounds.  Pulmonary:     Effort: Pulmonary effort is normal. No respiratory distress.     Breath sounds: Normal breath sounds.  Abdominal:     General: Bowel sounds are normal. There is no distension.     Palpations: Abdomen is soft.     Tenderness: There is no abdominal tenderness.  Musculoskeletal:     Cervical back: Neck supple.     Right lower leg: No edema.     Left lower leg: No edema.     Comments: Left hemiplegia with stiffness present     Lymphadenopathy:     Cervical: No cervical adenopathy.  Skin:    General: Skin is warm and dry.    Neurological:     Mental Status: He is alert. Mental status is at baseline.  Psychiatric:        Mood and Affect: Mood normal.        ASSESSMENT/ PLAN:  TODAY;   1. Diabetic peripheral neuropathy: is stable will continue gabapentin 200 mg nightly   2. Chronic constipation: is stable will continue colace twice daily as needed and miralax daily as needed  3. Cerebrovascular accident (CVA) due to embolism of right middle cerebral artery: s/p compression right frontotemporoparietal craniotomy for decompression with duraplasty 2018: is stable will continue eliquis 5 mg twice daily   PREVIOUS   4. 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 GERD without esophagitis: is stable will continue prilosec 40 mg daily   6. Dyslipidemia associated with type 2 diabetes mellitus is stable LDL 84 will continue lipitor 40 mg daily   7. AF (paroxsymal atrial fibrillation) heart rate is stable will continue toprol xl 12.5 mg twice daily for rate control eliquis 5 mg twice daily   8. Hypertension associated with type 2 diabetes mellitus; is stable b/p 119/66 will continue toprol xl 12.5 mg twice daily   9. Type 2 diabetes mellitus with neurological complications is stable hgb a1c 4.3 diet controlled.      MD is aware of resident's narcotic use and is in agreement with current plan of care. We will attempt to wean resident as appropriate.  Ok Edwards NP [facility]  Contact [phone] Monday through Friday 8am- 5pm  After hours call [phone]

## 2020-03-28 ENCOUNTER — Observation Stay (HOSPITAL_COMMUNITY)
Admission: EM | Admit: 2020-03-28 | Discharge: 2020-03-29 | Disposition: A | Discharge status: Home / Self Care | Payer: Medicaid Other | Attending: Internal Medicine | Admitting: Internal Medicine

## 2020-03-28 ENCOUNTER — Encounter (HOSPITAL_COMMUNITY): Payer: Self-pay | Admitting: Emergency Medicine

## 2020-03-28 ENCOUNTER — Other Ambulatory Visit: Payer: Self-pay

## 2020-03-28 ENCOUNTER — Emergency Department (HOSPITAL_COMMUNITY): Payer: Medicaid Other

## 2020-03-28 DIAGNOSIS — I69354 Hemiplegia and hemiparesis following cerebral infarction affecting left non-dominant side: Secondary | ICD-10-CM

## 2020-03-28 DIAGNOSIS — Z9889 Other specified postprocedural states: Secondary | ICD-10-CM

## 2020-03-28 DIAGNOSIS — Z20822 Contact with and (suspected) exposure to covid-19: Secondary | ICD-10-CM | POA: Diagnosis not present

## 2020-03-28 DIAGNOSIS — K219 Gastro-esophageal reflux disease without esophagitis: Secondary | ICD-10-CM | POA: Diagnosis present

## 2020-03-28 DIAGNOSIS — Z7901 Long term (current) use of anticoagulants: Secondary | ICD-10-CM | POA: Diagnosis not present

## 2020-03-28 DIAGNOSIS — I152 Hypertension secondary to endocrine disorders: Secondary | ICD-10-CM | POA: Diagnosis present

## 2020-03-28 DIAGNOSIS — I63411 Cerebral infarction due to embolism of right middle cerebral artery: Secondary | ICD-10-CM | POA: Diagnosis not present

## 2020-03-28 DIAGNOSIS — I480 Paroxysmal atrial fibrillation: Secondary | ICD-10-CM | POA: Diagnosis present

## 2020-03-28 DIAGNOSIS — Z794 Long term (current) use of insulin: Secondary | ICD-10-CM | POA: Insufficient documentation

## 2020-03-28 DIAGNOSIS — Z23 Encounter for immunization: Secondary | ICD-10-CM | POA: Diagnosis not present

## 2020-03-28 DIAGNOSIS — E1140 Type 2 diabetes mellitus with diabetic neuropathy, unspecified: Secondary | ICD-10-CM | POA: Diagnosis not present

## 2020-03-28 DIAGNOSIS — E1169 Type 2 diabetes mellitus with other specified complication: Secondary | ICD-10-CM | POA: Diagnosis present

## 2020-03-28 DIAGNOSIS — E1159 Type 2 diabetes mellitus with other circulatory complications: Secondary | ICD-10-CM | POA: Diagnosis present

## 2020-03-28 DIAGNOSIS — Z8673 Personal history of transient ischemic attack (TIA), and cerebral infarction without residual deficits: Secondary | ICD-10-CM | POA: Insufficient documentation

## 2020-03-28 DIAGNOSIS — R569 Unspecified convulsions: Secondary | ICD-10-CM | POA: Diagnosis not present

## 2020-03-28 DIAGNOSIS — E1142 Type 2 diabetes mellitus with diabetic polyneuropathy: Secondary | ICD-10-CM

## 2020-03-28 DIAGNOSIS — I10 Essential (primary) hypertension: Secondary | ICD-10-CM | POA: Diagnosis present

## 2020-03-28 DIAGNOSIS — E1149 Type 2 diabetes mellitus with other diabetic neurological complication: Secondary | ICD-10-CM | POA: Diagnosis present

## 2020-03-28 DIAGNOSIS — Z79899 Other long term (current) drug therapy: Secondary | ICD-10-CM | POA: Diagnosis not present

## 2020-03-28 DIAGNOSIS — E785 Hyperlipidemia, unspecified: Secondary | ICD-10-CM

## 2020-03-28 LAB — CBC WITH DIFFERENTIAL/PLATELET
Abs Immature Granulocytes: 0.07 10*3/uL (ref 0.00–0.07)
Basophils Absolute: 0 10*3/uL (ref 0.0–0.1)
Basophils Relative: 0 %
Eosinophils Absolute: 0.6 10*3/uL — ABNORMAL HIGH (ref 0.0–0.5)
Eosinophils Relative: 4 %
HCT: 51.7 % (ref 39.0–52.0)
Hemoglobin: 15.6 g/dL (ref 13.0–17.0)
Lymphocytes Relative: 33 %
Lymphs Abs: 5.1 10*3/uL — ABNORMAL HIGH (ref 0.7–4.0)
MCH: 28.2 pg (ref 26.0–34.0)
MCHC: 30.2 g/dL (ref 30.0–36.0)
MCV: 93.5 fL (ref 80.0–100.0)
Monocytes Absolute: 0.6 10*3/uL (ref 0.1–1.0)
Monocytes Relative: 4 %
Neutro Abs: 9.1 10*3/uL — ABNORMAL HIGH (ref 1.7–7.7)
Neutrophils Relative %: 59 %
Platelets: UNDETERMINED 10*3/uL (ref 150–400)
RBC: 5.53 MIL/uL (ref 4.22–5.81)
RDW: 13.9 % (ref 11.5–15.5)
WBC: 15.5 10*3/uL — ABNORMAL HIGH (ref 4.0–10.5)
nRBC: 0 % (ref 0.0–0.2)

## 2020-03-28 LAB — URINALYSIS, ROUTINE W REFLEX MICROSCOPIC
Bacteria, UA: NONE SEEN
Bilirubin Urine: NEGATIVE
Glucose, UA: NEGATIVE mg/dL
Hgb urine dipstick: NEGATIVE
Ketones, ur: NEGATIVE mg/dL
Leukocytes,Ua: NEGATIVE
Nitrite: NEGATIVE
Protein, ur: 100 mg/dL — AB
Specific Gravity, Urine: 1.021 (ref 1.005–1.030)
pH: 5 (ref 5.0–8.0)

## 2020-03-28 LAB — BASIC METABOLIC PANEL
Anion gap: 11 (ref 5–15)
BUN: 16 mg/dL (ref 6–20)
CO2: 21 mmol/L — ABNORMAL LOW (ref 22–32)
Calcium: 9.6 mg/dL (ref 8.9–10.3)
Chloride: 105 mmol/L (ref 98–111)
Creatinine, Ser: 0.99 mg/dL (ref 0.61–1.24)
GFR calc Af Amer: >60 mL/min (ref >60)
GFR calc non Af Amer: >60 mL/min (ref >60)
Glucose, Bld: 153 mg/dL — ABNORMAL HIGH (ref 70–99)
Potassium: 4.4 mmol/L (ref 3.5–5.1)
Sodium: 137 mmol/L (ref 135–145)

## 2020-03-28 LAB — GLUCOSE, CAPILLARY: Glucose-Capillary: 142 mg/dL — ABNORMAL HIGH (ref 70–99)

## 2020-03-28 LAB — SARS CORONAVIRUS 2 BY RT PCR (HOSPITAL ORDER, PERFORMED IN ~~LOC~~ HOSPITAL LAB): SARS Coronavirus 2: NEGATIVE

## 2020-03-28 LAB — MAGNESIUM: Magnesium: 2.1 mg/dL (ref 1.7–2.4)

## 2020-03-28 LAB — TSH: TSH: 0.811 u[IU]/mL (ref 0.350–4.500)

## 2020-03-28 LAB — PHOSPHORUS: Phosphorus: 2.8 mg/dL (ref 2.5–4.6)

## 2020-03-28 MED ORDER — PROSOURCE PLUS PO LIQD
30.0000 mL | Freq: Every day | ORAL | Status: DC
  Administered 2020-03-28 – 2020-03-29 (×2): 30 mL via ORAL
  Filled 2020-03-28 (×5): qty 30

## 2020-03-28 MED ORDER — PANTOPRAZOLE SODIUM 40 MG PO TBEC
40.0000 mg | DELAYED_RELEASE_TABLET | Freq: Every day | ORAL | Status: DC
  Administered 2020-03-28 – 2020-03-29 (×2): 40 mg via ORAL
  Filled 2020-03-28 (×2): qty 1

## 2020-03-28 MED ORDER — ONDANSETRON HCL 4 MG/2ML IJ SOLN: 4.0000 mg | Freq: Four times a day (QID) | INTRAMUSCULAR | Status: DC | PRN

## 2020-03-28 MED ORDER — ATORVASTATIN CALCIUM 40 MG PO TABS
40.0000 mg | ORAL_TABLET | Freq: Every day | ORAL | Status: DC
  Administered 2020-03-28 – 2020-03-29 (×2): 40 mg via ORAL
  Filled 2020-03-28 (×2): qty 1

## 2020-03-28 MED ORDER — SENNA 8.6 MG PO TABS
2.0000 | ORAL_TABLET | Freq: Two times a day (BID) | ORAL | Status: DC
  Administered 2020-03-28 – 2020-03-29 (×2): 17.2 mg via ORAL
  Filled 2020-03-28 (×6): qty 2

## 2020-03-28 MED ORDER — PNEUMOCOCCAL VAC POLYVALENT 25 MCG/0.5ML IJ INJ
0.5000 mL | INJECTION | INTRAMUSCULAR | Status: AC
  Administered 2020-03-29: 0.5 mL via INTRAMUSCULAR
  Filled 2020-03-28: qty 0.5

## 2020-03-28 MED ORDER — LEVETIRACETAM IN NACL 1000 MG/100ML IV SOLN
1000.0000 mg | Freq: Once | INTRAVENOUS | Status: AC
  Administered 2020-03-28: 1000 mg via INTRAVENOUS
  Filled 2020-03-28: qty 100

## 2020-03-28 MED ORDER — GABAPENTIN 100 MG PO CAPS
200.0000 mg | ORAL_CAPSULE | Freq: Every day | ORAL | Status: DC
  Administered 2020-03-28: 200 mg via ORAL
  Filled 2020-03-28: qty 2

## 2020-03-28 MED ORDER — ACETAMINOPHEN 325 MG PO TABS
650.0000 mg | ORAL_TABLET | Freq: Four times a day (QID) | ORAL | Status: DC | PRN
  Administered 2020-03-28: 650 mg via ORAL
  Filled 2020-03-28: qty 2

## 2020-03-28 MED ORDER — BISACODYL 10 MG RE SUPP: 10.0000 mg | Freq: Every day | RECTAL | Status: DC | PRN

## 2020-03-28 MED ORDER — LORAZEPAM 2 MG/ML IJ SOLN
INTRAMUSCULAR | Status: AC
  Administered 2020-03-28: 1 mg via INTRAVENOUS
  Filled 2020-03-28: qty 1

## 2020-03-28 MED ORDER — INSULIN ASPART 100 UNIT/ML ~~LOC~~ SOLN
0.0000 [IU] | Freq: Three times a day (TID) | SUBCUTANEOUS | Status: DC
  Administered 2020-03-29: 1 [IU] via SUBCUTANEOUS

## 2020-03-28 MED ORDER — LEVETIRACETAM IN NACL 500 MG/100ML IV SOLN
500.0000 mg | Freq: Two times a day (BID) | INTRAVENOUS | Status: DC
  Administered 2020-03-28 – 2020-03-29 (×2): 500 mg via INTRAVENOUS
  Filled 2020-03-28 (×2): qty 100

## 2020-03-28 MED ORDER — LORAZEPAM 2 MG/ML IJ SOLN: 1.0000 mg | Freq: Once | INTRAMUSCULAR | Status: AC

## 2020-03-28 MED ORDER — METOPROLOL SUCCINATE ER 25 MG PO TB24
12.5000 mg | ORAL_TABLET | Freq: Two times a day (BID) | ORAL | Status: DC
  Administered 2020-03-28 – 2020-03-29 (×2): 12.5 mg via ORAL
  Filled 2020-03-28 (×2): qty 1

## 2020-03-28 MED ORDER — APIXABAN 5 MG PO TABS
5.0000 mg | ORAL_TABLET | Freq: Two times a day (BID) | ORAL | Status: DC
  Administered 2020-03-28 – 2020-03-29 (×2): 5 mg via ORAL
  Filled 2020-03-28 (×2): qty 1

## 2020-03-28 MED ORDER — ONDANSETRON HCL 4 MG PO TABS: 4.0000 mg | ORAL_TABLET | Freq: Four times a day (QID) | ORAL | Status: DC | PRN

## 2020-03-28 MED ORDER — SODIUM CHLORIDE 0.9 % IV SOLN: INTRAVENOUS | Status: AC

## 2020-03-28 MED ORDER — BACLOFEN 10 MG PO TABS
5.0000 mg | ORAL_TABLET | Freq: Three times a day (TID) | ORAL | Status: DC
  Administered 2020-03-28 – 2020-03-29 (×3): 5 mg via ORAL
  Filled 2020-03-28 (×3): qty 1

## 2020-03-28 MED ORDER — SODIUM CHLORIDE 0.9 % IV SOLN: Freq: Once | INTRAVENOUS | Status: DC

## 2020-03-28 NOTE — ED Provider Notes (Signed)
[facility] EMERGENCY DEPARTMENT Provider Note   CSN: [id number] Arrival date & time: 03/28/20  9485     History Chief Complaint  Patient presents with  . Seizures    Joe Wilkinson is a 59 y.o. male.  HPI  73yM with seizure like activity.  Coming from nursing facility.  Last seen normal around 0 830 this morning when medications are administered.  Shortly before arrival he was noted to have seizure-like activity and poorly responsive afterwards.  By the time he arrived to the emergency room he was awake and alert.  Following commands.  A left hemiparesis.  He reports that this is his baseline.  Denies any acute neurological complaints.  Denies any acute pain.  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    Past Medical History:  Diagnosis Date  . DM (diabetes mellitus) ([place])   . Hypertension   . Stroke [facility])     Patient Active Problem List   Diagnosis Date Noted  . Skin yeast infection 06/03/2019  . Hypertension associated with type 2 diabetes mellitus ([place]) 01/24/2019  . GERD without esophagitis 11/03/2018  . Dyslipidemia associated with type 2 diabetes mellitus ([place]) 11/03/2018  . Diabetic peripheral neuropathy ([place]) 11/03/2018  . Chronic constipation 11/03/2018  . Dandruff 11/03/2018  . Benign essential HTN   . Hemiparesis affecting left side as late effect of cerebrovascular accident (CVA) ([place])   . Vitamin D deficiency 09/13/2017  . 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  . Type 2 diabetes mellitus with neurological complications ([place])   . AF (paroxysmal atrial fibrillation) ([place]) 04/27/2017  . Cerebrovascular accident (CVA) due to embolism of right middle cerebral artery ([place]) 04/01/2017  . Cytotoxic cerebral edema ([place]) 04/01/2017    Past Surgical History:  Procedure Laterality Date  . CRANIECTOMY Right 04/01/2017   Procedure: RIGHT DECOMPRESSIVE CRANIECTOMY;  Surgeon: Ditty,  Kevan Ny, MD;  Location: [place];  Service: Neurosurgery;  Laterality: Right;  . ESOPHAGOGASTRODUODENOSCOPY N/A 04/13/2017   Procedure: ESOPHAGOGASTRODUODENOSCOPY (EGD);  Surgeon: Georganna Skeans, MD;  Location: [place];  Service: General;  Laterality: N/A;  bedside  . INCISION AND DRAINAGE PERIRECTAL ABSCESS N/A 09/28/2017   Procedure: IRRIGATION AND DEBRIDEMENT PERIANAL ABSCESS;  Surgeon: Virl Cagey, MD;  Location: [facility];  Service: General;  Laterality: N/A;  . PEG PLACEMENT N/A 04/13/2017   Procedure: PERCUTANEOUS ENDOSCOPIC GASTROSTOMY (PEG) PLACEMENT;  Surgeon: Georganna Skeans, MD;  Location: [facility];  Service: General;  Laterality: N/A;     Family History  Problem Relation Age of Onset  . Hypertension Father     Social History   Tobacco Use  . Smoking status: Former Smoker    Types: Cigarettes  . Smokeless tobacco: Never Used  . Tobacco comment: UTA  Vaping Use  . Vaping Use: Never used  Substance Use Topics  . Alcohol use: No    Comment: UTA  . Drug use: No    Comment: UTA    Home Medications Prior to Admission medications   Medication Sig Start Date End Date Taking? Authorizing Provider  acetaminophen (TYLENOL) 325 MG tablet Take 650 mg by mouth every 4 (four) hours as needed. Not to exceed 3 grams in 24 hours    [provider]  Amino Acids-Protein Hydrolys (FEEDING SUPPLEMENT, PRO-STAT 64,) LIQD Take 30 mLs by mouth daily. 03/19/20   [provider]  apixaban (ELIQUIS) 5 MG TABS tablet Take 5 mg by mouth 2 (two) times daily.  06/18/18   [provider]  atorvastatin (LIPITOR) 40 MG tablet Take 40 mg by mouth daily.    [provider]  Baclofen 5 MG TABS Take 5 mg by mouth 3 (three) times daily. 04/02/19   Frann Rider, NP  bisacodyl (DULCOLAX) 10 MG suppository Place 1 suppository (10 mg total) rectally daily as needed for moderate constipation. 05/29/17   Mikhail, Velta Addison, DO  Cholecalciferol 1.25 MG (50000 UT)  capsule Take 50,000 Units by mouth daily. Once a day on mon    [provider]  coal tar (NEUTROGENA T-GEL) 0.5 % shampoo Apply 1 application topically See admin instructions. Apply to scalp on shower days twice a week Wed And Sat prn    [provider]  Dextromethorphan-guaiFENesin (ROBITUSSIN COUGH+CHEST CONG DM PO) Take 10 mLs by mouth every 6 (six) hours as needed.    [provider]  gabapentin (NEURONTIN) 100 MG capsule Take 200 mg by mouth at bedtime.     [provider]  Menthol, Topical Analgesic, (BIOFREEZE) 4 % GEL Apply 1 application topically 2 (two) times daily. Apply to left shoulder     [provider]  metoprolol succinate (TOPROL-XL) 25 MG 24 hr tablet Take 12.5 mg by mouth 2 (two) times daily. 06/19/18   [provider]  NON FORMULARY Diet type:   Regular Diet 11/14/17   [provider]  omeprazole (PRILOSEC) 40 MG capsule Take 40 mg by mouth daily.     [provider]  senna (SENOKOT) 8.6 MG TABS tablet Take 2 tablets by mouth 2 (two) times daily.     [provider]    Allergies    Cheese and Penicillins  Review of Systems   Review of Systems All systems reviewed and negative, other than as noted in HPI.  Physical Exam Updated Vital Signs Ht 5\' 6"  (1.676 m)   Wt 81.1 kg   BMI 28.86 kg/m   Physical Exam Vitals and nursing note reviewed.  Constitutional:      General: He is not in acute distress.    Appearance: He is well-developed.  HENT:     Head: Normocephalic and atraumatic.  Eyes:     General:        Right eye: No discharge.        Left eye: No discharge.     Conjunctiva/sclera: Conjunctivae normal.  Cardiovascular:     Rate and Rhythm: Normal rate and regular rhythm.     Heart sounds: Normal heart sounds. No murmur heard.  No friction rub. No gallop.   Pulmonary:     Effort: Pulmonary effort is normal. No respiratory distress.     Breath sounds: Normal breath sounds.   Abdominal:     General: There is no distension.     Palpations: Abdomen is soft.     Tenderness: There is no abdominal tenderness.  Musculoskeletal:        General: No tenderness.     Cervical back: Neck supple.  Skin:    General: Skin is warm and dry.  Neurological:     Mental Status: He is alert.     Comments: Speech dysarthric, but understandable.  Answering questions appropriately.  Following commands.  Left hemiparesis.  Psychiatric:        Behavior: Behavior normal.        Thought Content: Thought content normal.     ED Results / Procedures / Treatments   Labs (all labs ordered are listed, but only abnormal results  are displayed) Labs Reviewed  CBC WITH DIFFERENTIAL/PLATELET - Abnormal; Notable for the following components:      Result Value   WBC 15.5 (*)    Neutro Abs 9.1 (*)    Lymphs Abs 5.1 (*)    Eosinophils Absolute 0.6 (*)    All other components within normal limits  BASIC METABOLIC PANEL - Abnormal; Notable for the following components:   CO2 21 (*)    Glucose, Bld 153 (*)    All other components within normal limits  URINALYSIS, ROUTINE W REFLEX MICROSCOPIC - Abnormal; Notable for the following components:   APPearance HAZY (*)    Protein, ur 100 (*)    All other components within normal limits  SARS CORONAVIRUS 2 BY RT PCR [facility] ORDER, [place] LAB)    EKG EKG Interpretation  Date/Time:  Saturday March 28 2020 11:02:06 EDT Ventricular Rate:  97 PR Interval:    QRS Duration: 95 QT Interval:  333 QTC Calculation: 423 R Axis:   58 Text Interpretation: Sinus rhythm Borderline repolarization abnormality Confirmed by Virgel Manifold [id number]) on 03/28/2020 11:58:34 AM   Radiology No results found.   CT Head Wo Contrast  Result Date: 03/28/2020 CLINICAL DATA:  Acute seizure.  History of trauma. EXAM: CT HEAD WITHOUT CONTRAST TECHNIQUE: Contiguous axial images were obtained from the base of the skull through the vertex  without intravenous contrast. COMPARISON:  08/24/2018 FINDINGS: Brain: Extensive remote infarct to the right cerebral hemisphere affecting all of the MCA territory and also extending into the distal ACA territory and involving the right occipital lobe. Marked ex vacuo dilatation of the right lateral ventricle. Smaller and also remote left occipital parietal cortex infarct. Associated wallerian degeneration at the right cerebral peduncle. No evidence of acute infarct, acute hemorrhage, obstructive hydrocephalus, or mass. Vascular: No hyperdense vessel or unexpected calcification. Skull: Right craniectomy.  No acute or aggressive finding. Sinuses/Orbits: Negative IMPRESSION: 1. No acute finding. 2. Extensive remote infarct in the right more than left cerebrum. Electronically Signed   By: Monte Fantasia M.D.   On: 03/28/2020 11:30    Procedures Procedures (including critical care time)  Medications Ordered in ED Medications - No data to display  ED Course  I have reviewed the triage vital signs and the nursing notes.  Pertinent labs & imaging results that were available during my care of the patient were reviewed by me and considered in my medical decision making (see chart for details).    MDM Rules/Calculators/A&P                          60 year old male with seizure-like activity.  Witnessed in the emergency room.  Significant prior infarct necessitating craniotomy and significant continued deficits.  He does not appear to have a diagnosed seizure history though.  After second event here in the emergency room, will admit.  Neurology consultation.  Given Ativan and loaded with Keppra in the meantime.   Final Clinical Impression(s) / ED Diagnoses Final diagnoses:  Seizure-like activity [facility])    Rx / [place] Orders ED Discharge Orders    None       Virgel Manifold, MD 04/01/20 [phone]

## 2020-03-28 NOTE — Progress Notes (Signed)
MEWS Guidelines - (patients age 59 and over)  Pt has Afib(HR of 105), and low BP was pt's baseline prior to admission to unit. Reviewed with charge RN and oncoming RN without need to escalate. Pt will continue to be monitored.

## 2020-03-28 NOTE — H&P (Signed)
History and Physical    [place] Ndiaye PQZ:[id number] DOB: [birth year] DOA: 03/28/2020  PCP: Gerlene Fee, NP   Patient coming from: SNF  I have personally briefly reviewed patient's old medical records in [place]  Chief Complaint: Seizure-like activity.  HPI: Joe Wilkinson is a 59 y.o. 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  Patient reports no prodromal symptoms, denies chest pain, fever, chills, coughing spells, dysuria, hematuria, melena, hematochezia, worsening deficits or any other complaints.  Patient was transiently postictal and poorly responsive after seizure event.  COVID test negative.  ED Course: Ativan x1 and loading dose with Keppra has been given.  Gentle IV fluids provided and [facility] contacted to admit patient for further evaluation and management.  CT head without acute intracranial normalities, blood work at baseline for him.  Teleneurology was consulted; recommendations for EEG and close observation with initiation of Keppra as AED.  Review of Systems: As per HPI otherwise all other systems reviewed and are negative.   Past Medical History:  Diagnosis Date  . DM (diabetes mellitus) ([place])   . Hypertension   . Stroke [facility])     Past Surgical History:  Procedure Laterality Date  . CRANIECTOMY Right 04/01/2017   Procedure: RIGHT DECOMPRESSIVE CRANIECTOMY;  Surgeon: Ditty, Kevan Ny, MD;  Location: [place];  Service: Neurosurgery;  Laterality: Right;  . ESOPHAGOGASTRODUODENOSCOPY N/A 04/13/2017   Procedure: ESOPHAGOGASTRODUODENOSCOPY (EGD);  Surgeon: Georganna Skeans, MD;  Location: [place];  Service:  General;  Laterality: N/A;  bedside  . INCISION AND DRAINAGE PERIRECTAL ABSCESS N/A 09/28/2017   Procedure: IRRIGATION AND DEBRIDEMENT PERIANAL ABSCESS;  Surgeon: Virl Cagey, MD;  Location: [facility];  Service: General;  Laterality: N/A;  . PEG PLACEMENT N/A 04/13/2017   Procedure: PERCUTANEOUS ENDOSCOPIC GASTROSTOMY (PEG) PLACEMENT;  Surgeon: Georganna Skeans, MD;  Location: [place];  Service: General;  Laterality: N/A;    Social History  reports that he has quit smoking. His smoking use included cigarettes. He has never used smokeless tobacco. He reports that he does not drink alcohol and does not use drugs.  Allergies  Allergen Reactions  . Cheese   . Penicillins Hives    Has patient had a PCN reaction causing immediate rash, facial/tongue/throat swelling, SOB or lightheadedness with hypotension: Unknown Has patient had a PCN reaction causing severe rash involving mucus membranes or skin necrosis: Unknown Has patient had a PCN reaction that required hospitalization: No Has patient had a PCN reaction occurring within the last 10 years: No If all of the above answers are "NO", then may proceed with Cephalosporin use.     Family History  Problem Relation Age of Onset  . Hypertension Father     Prior to Admission medications   Medication Sig Start Date End Date Taking? Authorizing Provider  acetaminophen (TYLENOL) 325 MG tablet Take 650 mg by mouth every 4 (four) hours as needed. Not to exceed 3 grams in 24 hours   Yes [provider]  apixaban (ELIQUIS) 5 MG TABS tablet Take 5 mg by mouth 2 (two) times daily.  06/18/18  Yes [provider]  atorvastatin (LIPITOR) 40 MG tablet Take 40 mg by mouth daily.   Yes [provider]  Baclofen 5 MG TABS  Take 5 mg by mouth 3 (three) times daily. 04/02/19  Yes McCue, Janett Billow, NP  bisacodyl (DULCOLAX) 10 MG suppository Place 1 suppository (10 mg total) rectally daily as needed for moderate constipation. 05/29/17   Yes Mikhail, Velta Addison, DO  Cholecalciferol 1.25 MG (50000 UT) capsule Take 50,000 Units by mouth daily. Once a day on mon   Yes [provider]  gabapentin (NEURONTIN) 100 MG capsule Take 200 mg by mouth at bedtime.    Yes [provider]  Menthol, Topical Analgesic, (BIOFREEZE) 4 % GEL Apply 1 application topically 2 (two) times daily. Apply to left shoulder    Yes [provider]  metoprolol tartrate (LOPRESSOR) 25 MG tablet Take 12.5 mg by mouth 2 (two) times daily.   Yes [provider]  omeprazole (PRILOSEC) 40 MG capsule Take 40 mg by mouth daily.    Yes [provider]  senna (SENOKOT) 8.6 MG TABS tablet Take 2 tablets by mouth 2 (two) times daily.    Yes [provider]  Amino Acids-Protein Hydrolys (FEEDING SUPPLEMENT, PRO-STAT 64,) LIQD Take 30 mLs by mouth daily. Patient not taking: Reported on 03/28/2020 03/19/20   [provider]  coal tar (NEUTROGENA T-GEL) 0.5 % shampoo Apply 1 application topically See admin instructions. Apply to scalp on shower days twice a week Wed And Sat prn    [provider]  Dextromethorphan-guaiFENesin (ROBITUSSIN COUGH+CHEST CONG DM PO) Take 10 mLs by mouth every 6 (six) hours as needed.    [provider]  metoprolol succinate (TOPROL-XL) 25 MG 24 hr tablet Take 12.5 mg by mouth 2 (two) times daily. Patient not taking: Reported on 03/28/2020 06/19/18   [provider]    Physical Exam: Vitals:   03/28/20 1210 03/28/20 1211 03/28/20 1405 03/28/20 1415  BP: 100/88 100/84 99/85 92/71   Pulse:    100  Resp: (!) 22 19    SpO2:    95%  Weight:      Height:        Constitutional: Calm and comfortable; reports having some headaches.  No chest pain, no shortness of breath, no fever. Vitals:   03/28/20 1210 03/28/20 1211 03/28/20 1405 03/28/20 1415  BP: 100/88 100/84 99/85 92/71   Pulse:    100  Resp: (!) 22 19    SpO2:    95%  Weight:      Height:       Eyes:  PERRL, lids and conjunctivae normal, no nystagmus ENMT: Mucous membranes are moist. Posterior pharynx clear of any exudate or lesions. Neck: normal, supple, no masses, no thyromegaly Respiratory: clear to auscultation bilaterally, no wheezing, no crackles. Normal respiratory effort. No accessory muscle use.  Cardiovascular: Regular rate and sinus rhythm currently; no rubs, no gallops, no JVD. Abdomen: no tenderness, no masses palpated. No hepatosplenomegaly. Bowel sounds positive.  Musculoskeletal: No joint deformity, no cyanosis or clubbing.  Good range of motion on his right side with chronic left-sided hemiparesis from previous stroke. Skin: no rashes, no petechiae. Neurologic: CN grossly intact; left-sided hemiparesis unchanged from previous stroke.  Normal speech. Psychiatric:  Alert and oriented x 3. Normal mood.    Labs on Admission: I have personally reviewed following labs and imaging studies  CBC: Recent Labs  Lab 03/28/20 0954  WBC 15.5*  NEUTROABS 9.1*  HGB 15.6  HCT 51.7  MCV 93.5  PLT PLATELET CLUMPS NOTED ON SMEAR, UNABLE TO ESTIMATE    Basic Metabolic Panel: Recent Labs  Lab 03/28/20 0954  NA 137  K 4.4  CL 105  CO2 21*  GLUCOSE 153*  BUN 16  CREATININE 0.99  CALCIUM 9.6    GFR: Estimated Creatinine Clearance: 81.3 mL/min (by C-G formula based on SCr of 0.99 mg/dL).  Urine analysis:    Component Value Date/Time   COLORURINE YELLOW 03/28/2020 0957   APPEARANCEUR HAZY (A) 03/28/2020 0957   LABSPEC 1.021 03/28/2020 0957   PHURINE 5.0 03/28/2020 0957   GLUCOSEU NEGATIVE 03/28/2020 0957   HGBUR NEGATIVE 03/28/2020 0957   BILIRUBINUR NEGATIVE 03/28/2020 0957   KETONESUR NEGATIVE 03/28/2020 0957   PROTEINUR 100 (A) 03/28/2020 0957   NITRITE NEGATIVE 03/28/2020 0957   LEUKOCYTESUR NEGATIVE 03/28/2020 0957    Radiological Exams on Admission: CT Head Wo Contrast  Result Date: 03/28/2020 CLINICAL DATA:  Acute seizure.  History of trauma. EXAM: CT  HEAD WITHOUT CONTRAST TECHNIQUE: Contiguous axial images were obtained from the base of the skull through the vertex without intravenous contrast. COMPARISON:  08/24/2018 FINDINGS: Brain: Extensive remote infarct to the right cerebral hemisphere affecting all of the MCA territory and also extending into the distal ACA territory and involving the right occipital lobe. Marked ex vacuo dilatation of the right lateral ventricle. Smaller and also remote left occipital parietal cortex infarct. Associated wallerian degeneration at the right cerebral peduncle. No evidence of acute infarct, acute hemorrhage, obstructive hydrocephalus, or mass. Vascular: No hyperdense vessel or unexpected calcification. Skull: Right craniectomy.  No acute or aggressive finding. Sinuses/Orbits: Negative IMPRESSION: 1. No acute finding. 2. Extensive remote infarct in the right more than left cerebrum. Electronically Signed   By: Monte Fantasia M.D.   On: 03/28/2020 11:30    EKG: Independently reviewed.  Sinus rhythm currently; regular rate, normal QT, no acute ischemic changes.  Assessment/Plan 1-Seizure ([place]) -new onset -Risk factors include prior extensive ischemic stroke and craniotomy -EEG has been ordered -CT head with no acute intracranial abnormalities -As needed Ativan and a loading dose Keppra provided in the ED -Teleneurology service has been consulted -Will also check prolactin level, HIV, and TSH -continue Keppra. -Gentle IV fluids will be provided overnight.  2-Cerebrovascular accident (CVA) due to embolism of right middle cerebral artery ([place]) -Continue risk factor modifications -Continue Eliquis for secondary prevention.  3-chronic paroxysmal atrial fibrillation (HCC) -Rate controlled -Continue metoprolol at current dose -Continue Eliquis.  4-Type 2 diabetes mellitus with neurological complications and neuropathy (HCC) -Modified carbohydrate diet -Check A1c -Sliding scale insulin and close monitoring  of CBGs will be provided.  5-Benign essential HTN -Soft but stable blood pressure appreciated -Continue current dose of metoprolol and follow vital signs.  6-Hemiparesis affecting left side as late effect of cerebrovascular accident (CVA) ([place]) -No new changes -Continue supportive care and assist -Patient is a long-term resident of a skilled nursing facility.  7-GERD without esophagitis -Continue PPI.  8-Dyslipidemia associated with type 2 diabetes mellitus ([place]) -Continue statins.  9-Diabetic peripheral neuropathy (HCC) -Continue Neurontin.  DVT prophylaxis: Chronically on Eliquis. Code Status:   Full code Family Communication:  No family at bedside. Disposition Plan:   Patient is from:  Skilled nursing facility  Anticipated DC to:  Skilled nursing facility  Anticipated DC date:  03/29/20  Anticipated DC barriers: Controlling seizure activities  Consults called:  Tele-neurology (consulted by EDP) Admission status:  Observation, telemetry bed, length of stay less than 2 midnights.  Severity of Illness: Mild severity; in observation patient will be placed, will have an EEG done and assess response to antiepileptic drugs.    Barton Dubois MD [facility]  How to contact the [facility] Attending or Consulting provider [place] or covering provider during after hours [place], for this patient?   1. Check the care team in [facility] and look for a) attending/consulting [facility] provider listed and b) the [facility] team listed 2. Log into [email or web address] and use [place]'s universal password to access. If you do not have the password, please contact the hospital operator. 3. Locate the [facility] provider you are looking for under [facility] and page to a number that you can be directly reached. 4. If you still have difficulty reaching the provider, please page the [facility] (Director on Call) for the Hospitalists listed on amion for assistance.  03/28/2020, 5:28 PM

## 2020-03-28 NOTE — ED Triage Notes (Signed)
Pt here from [facility] by [place] following a "focal seizure", per EMS pt postictal upon their arrival; no hx of seizures; pt has had a stroke in the past and has left sided deficits

## 2020-03-28 NOTE — ED Notes (Signed)
Report to [place], Therapist, sports, 300 by Angelina Pih, RN

## 2020-03-29 ENCOUNTER — Inpatient Hospital Stay
Admission: RE | Admit: 2020-03-29 | Discharge: 2022-08-29 | Disposition: A | Discharge status: Skilled Nursing Facility | Payer: Medicaid Other | Source: Ambulatory Visit | Attending: Internal Medicine | Admitting: Internal Medicine

## 2020-03-29 DIAGNOSIS — E042 Nontoxic multinodular goiter: Secondary | ICD-10-CM

## 2020-03-29 DIAGNOSIS — Z9889 Other specified postprocedural states: Secondary | ICD-10-CM

## 2020-03-29 DIAGNOSIS — E049 Nontoxic goiter, unspecified: Principal | ICD-10-CM

## 2020-03-29 DIAGNOSIS — I10 Essential (primary) hypertension: Secondary | ICD-10-CM | POA: Diagnosis not present

## 2020-03-29 DIAGNOSIS — I480 Paroxysmal atrial fibrillation: Secondary | ICD-10-CM | POA: Diagnosis not present

## 2020-03-29 DIAGNOSIS — R569 Unspecified convulsions: Secondary | ICD-10-CM | POA: Diagnosis not present

## 2020-03-29 DIAGNOSIS — R221 Localized swelling, mass and lump, neck: Secondary | ICD-10-CM

## 2020-03-29 DIAGNOSIS — E1149 Type 2 diabetes mellitus with other diabetic neurological complication: Secondary | ICD-10-CM

## 2020-03-29 DIAGNOSIS — I63411 Cerebral infarction due to embolism of right middle cerebral artery: Secondary | ICD-10-CM | POA: Diagnosis not present

## 2020-03-29 LAB — BASIC METABOLIC PANEL
Anion gap: 8 (ref 5–15)
BUN: 15 mg/dL (ref 6–20)
CO2: 22 mmol/L (ref 22–32)
Calcium: 9.3 mg/dL (ref 8.9–10.3)
Chloride: 107 mmol/L (ref 98–111)
Creatinine, Ser: 0.69 mg/dL (ref 0.61–1.24)
GFR calc Af Amer: >60 mL/min (ref >60)
GFR calc non Af Amer: >60 mL/min (ref >60)
Glucose, Bld: 113 mg/dL — ABNORMAL HIGH (ref 70–99)
Potassium: 3.9 mmol/L (ref 3.5–5.1)
Sodium: 137 mmol/L (ref 135–145)

## 2020-03-29 LAB — HIV ANTIBODY (ROUTINE TESTING W REFLEX): HIV Screen 4th Generation wRfx: NONREACTIVE

## 2020-03-29 LAB — GLUCOSE, CAPILLARY: Glucose-Capillary: 106 mg/dL — ABNORMAL HIGH (ref 70–99)

## 2020-03-29 LAB — CBC
HCT: 49.1 % (ref 39.0–52.0)
Hemoglobin: 14.7 g/dL (ref 13.0–17.0)
MCH: 27.9 pg (ref 26.0–34.0)
MCHC: 29.9 g/dL — ABNORMAL LOW (ref 30.0–36.0)
MCV: 93.3 fL (ref 80.0–100.0)
Platelets: 172 10*3/uL (ref 150–400)
RBC: 5.26 MIL/uL (ref 4.22–5.81)
RDW: 14.1 % (ref 11.5–15.5)
WBC: 13.4 10*3/uL — ABNORMAL HIGH (ref 4.0–10.5)
nRBC: 0 % (ref 0.0–0.2)

## 2020-03-29 LAB — LIPID PANEL
Cholesterol: 143 mg/dL (ref 0–200)
HDL: 30 mg/dL — ABNORMAL LOW (ref >40)
LDL Cholesterol: 96 mg/dL (ref 0–99)
Total CHOL/HDL Ratio: 4.8 RATIO
Triglycerides: 83 mg/dL (ref <150)
VLDL: 17 mg/dL (ref 0–40)

## 2020-03-29 LAB — HEMOGLOBIN A1C
Hgb A1c MFr Bld: 4.9 % (ref 4.8–5.6)
Mean Plasma Glucose: 93.93 mg/dL

## 2020-03-29 MED ORDER — LEVETIRACETAM 500 MG PO TABS: 500.0000 mg | ORAL_TABLET | Freq: Two times a day (BID) | ORAL | Status: DC

## 2020-03-29 MED ORDER — METOPROLOL SUCCINATE ER 25 MG PO TB24: 12.5000 mg | ORAL_TABLET | Freq: Every day | ORAL | Status: DC

## 2020-03-29 MED ORDER — CHOLECALCIFEROL 1.25 MG (50000 UT) PO CAPS: 50000.0000 [IU] | ORAL_CAPSULE | ORAL | Status: DC

## 2020-03-29 NOTE — Progress Notes (Signed)
Nsg Discharge Note  Admit Date:  03/28/2020 Discharge date: 03/29/2020   Shailen Thielen to be D/C'd Nursing Home per MD order.  AVS completed.  Copy for chart, and copy for patient signed, and dated. Patient/caregiver able to verbalize understanding. [facility] transported patient. IV removed.   Discharge Medication: Allergies as of 03/29/2020      Reactions   Cheese    Penicillins Hives   Has patient had a PCN reaction causing immediate rash, facial/tongue/throat swelling, SOB or lightheadedness with hypotension: Unknown Has patient had a PCN reaction causing severe rash involving mucus membranes or skin necrosis: Unknown Has patient had a PCN reaction that required hospitalization: No Has patient had a PCN reaction occurring within the last 10 years: No If all of the above answers are "NO", then may proceed with Cephalosporin use.      Medication List    STOP taking these medications   metoprolol succinate 25 MG 24 hr tablet Commonly known as: TOPROL-XL     TAKE these medications   acetaminophen 325 MG tablet Commonly known as: TYLENOL Take 650 mg by mouth every 4 (four) hours as needed. Not to exceed 3 grams in 24 hours   apixaban 5 MG Tabs tablet Commonly known as: ELIQUIS Take 5 mg by mouth 2 (two) times daily.   atorvastatin 40 MG tablet Commonly known as: LIPITOR Take 40 mg by mouth daily.   Baclofen 5 MG Tabs Take 5 mg by mouth 3 (three) times daily.   Biofreeze 4 % Gel Generic drug: Menthol (Topical Analgesic) Apply 1 application topically 2 (two) times daily. Apply to left shoulder   bisacodyl 10 MG suppository Commonly known as: DULCOLAX Place 1 suppository (10 mg total) rectally daily as needed for moderate constipation.   Cholecalciferol 1.25 MG (50000 UT) capsule Take 1 capsule (50,000 Units total) by mouth once a week. Once a day on mon What changed: when to take this   coal tar 0.5 % shampoo Commonly known as: NEUTROGENA T-GEL Apply 1 application  topically See admin instructions. Apply to scalp on shower days twice a week Wed And Sat prn   feeding supplement (PRO-STAT 64) Liqd Take 30 mLs by mouth daily.   gabapentin 100 MG capsule Commonly known as: NEURONTIN Take 200 mg by mouth at bedtime.   levETIRAcetam 500 MG tablet Commonly known as: KEPPRA Take 1 tablet (500 mg total) by mouth 2 (two) times daily.   metoprolol tartrate 25 MG tablet Commonly known as: LOPRESSOR Take 12.5 mg by mouth 2 (two) times daily.   omeprazole 40 MG capsule Commonly known as: PRILOSEC Take 40 mg by mouth daily.   ROBITUSSIN COUGH+CHEST CONG DM PO Take 10 mLs by mouth every 6 (six) hours as needed.   senna 8.6 MG Tabs tablet Commonly known as: SENOKOT Take 2 tablets by mouth 2 (two) times daily.       Discharge Assessment: Vitals:   03/29/20 0833 03/29/20 1438  BP: 95/70 (!) 100/59  Pulse: 88 66  Resp:  18  Temp:  97.6 F (36.4 C)  SpO2:     Skin clean, dry and intact without evidence of skin break down, no evidence of skin tears noted. IV catheter discontinued intact. Site without signs and symptoms of complications - no redness or edema noted at insertion site, patient denies c/o pain - only slight tenderness at site.  Dressing with slight pressure applied.  D/c Instructions-Education: Discharge instructions given to patient/family with verbalized understanding. D/c education completed with  patient/family including follow up instructions, medication list, d/c activities limitations if indicated, with other d/c instructions as indicated by MD - patient able to verbalize understanding, all questions fully answered. Patient instructed to return to ED, call 911, or call MD for any changes in condition.  Patient escorted via [place], and D/C home via private auto.  Zenaida Deed, RN 03/29/2020 3:31 PM

## 2020-03-29 NOTE — Discharge Summary (Signed)
Physician Discharge Summary  Joe Wilkinson YQM:[id number] DOB: 1960/11/18 DOA: 03/28/2020  PCP: Gerlene Fee, NP  Admit date: 03/28/2020 Discharge date: 03/29/2020  Time spent: 35 minutes  Recommendations for Outpatient Follow-up:  1. Repeat basic metabolic panel to follow trends and renal function 2. Reassess blood pressure and adjust antihypertensive regimen as needed. 3. Outpatient follow-up with neurology service in 4 weeks to determine long-term antiepileptic drug therapy and to have EEG done.   Discharge Diagnoses:  Principal Problem:   Seizure [facility]) Active Problems:   Cerebrovascular accident (CVA) due to embolism of right middle cerebral artery (HCC)   AF (paroxysmal atrial fibrillation) ([place])   Type 2 diabetes mellitus with neurological complications ([place])   Status post craniectomy   Benign essential HTN   Hemiparesis affecting left side as late effect of cerebrovascular accident (CVA) ([place])   GERD without esophagitis   Dyslipidemia associated with type 2 diabetes mellitus ([place])   Diabetic peripheral neuropathy ([place])   Hypertension associated with type 2 diabetes mellitus ([place])   Discharge Condition: Stable and improved.  Discharge back to skilled nursing facility with follow-up with neurology service in 4 weeks.  CODE STATUS: Full code.  Diet recommendation: Heart healthy and modified carbohydrate diet.  Filed Weights   03/28/20 0939 03/28/20 1700  Weight: 81.1 kg 81.6 kg    History of present illness:  Joe Wilkinson is a 59 y.o. 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  Patient reports no prodromal symptoms,  denies chest pain, fever, chills, coughing spells, dysuria, hematuria, melena, hematochezia, worsening deficits or any other complaints.  Patient was transiently postictal and poorly responsive after seizure event.  COVID test negative.  ED Course: Ativan x1 and loading dose with Keppra has been given.  Gentle IV fluids provided and [facility] contacted to admit patient for further evaluation and management.  CT head without acute intracranial normalities, blood work at baseline for him.  Teleneurology was consulted; recommendations for EEG and close observation with initiation of Keppra as AED.  Hospital Course:  1-Seizure [facility]) -new onset -Risk factors include prior extensive ischemic stroke and craniotomy -EEG will be done as an outpatient -patient will continue to be on keppra BID and follow up with neurology service.  -CT head with no acute intracranial abnormalities  2-Cerebrovascular accident (CVA) due to embolism of right middle cerebral artery ([place]) -Continue risk factor modifications -Continue Eliquis for secondary prevention.  3-chronic paroxysmal atrial fibrillation (HCC) -Rate controlled -Continue metoprolol at current dose -Continue Eliquis.  4-Type 2 diabetes mellitus with neurological complications and neuropathy (HCC) -Modified carbohydrate diet -A1c 4.9  5-Benign essential HTN -Soft but stable overall -continue heart healthy diet -continue low dose metoprolol  6-Hemiparesis affecting left side as late effect of cerebrovascular accident (CVA) ([place]) -No new changes -Continue supportive care and assistance -Patient is a long-term resident at [facility] skilled nursing facility.  7-GERD without esophagitis -Continue PPI.  8-Dyslipidemia associated with type 2 diabetes mellitus ([place]) -Continue statins.  9-Diabetic peripheral neuropathy (HCC) -Continue Neurontin.  Procedures:  See below for x-ray reports.  Consultations:  Dr. Merlene Laughter (curbside):  Recommendations given for Keppra twice a day and outpatient follow-up in 4 weeks for EEG and final decision regarding long-term anticonvulsant drugs.  Discharge Exam:  Vitals:   03/29/20 0833 03/29/20 1438  BP: 95/70 (!) 100/59  Pulse: 88 66  Resp:  18  Temp:  97.6 F (36.4 C)  SpO2:      General: Cognitively back to baseline; no new focal deficit.  No chest pain, no nausea, no vomiting, no shortness of breath.  No further seizure activity is appreciated. Cardiovascular: Rate controlled, no rubs, no gallops, no JVD. Respiratory: Clear to auscultation bilaterally, no using accessory muscles, good O2 sat on room air. Abdomen: Soft, nontender, nondistended, positive bowel sounds Extremities: No cyanosis or clubbing.  Discharge Instructions   Discharge Instructions    Discharge instructions   Complete by: As directed    Take medications as prescribed Maintain adequate hydration Continue modified carbohydrate diet Follow-up with neurology service (Dr. Merlene Laughter) in 4 weeks.     Allergies as of 03/29/2020      Reactions   Cheese    Penicillins Hives   Has patient had a PCN reaction causing immediate rash, facial/tongue/throat swelling, SOB or lightheadedness with hypotension: Unknown Has patient had a PCN reaction causing severe rash involving mucus membranes or skin necrosis: Unknown Has patient had a PCN reaction that required hospitalization: No Has patient had a PCN reaction occurring within the last 10 years: No If all of the above answers are "NO", then may proceed with Cephalosporin use.      Medication List    STOP taking these medications   metoprolol succinate 25 MG 24 hr tablet Commonly known as: TOPROL-XL     TAKE these medications   acetaminophen 325 MG tablet Commonly known as: TYLENOL Take 650 mg by mouth every 4 (four) hours as needed. Not to exceed 3 grams in 24 hours   apixaban 5 MG Tabs tablet Commonly known as: ELIQUIS Take 5 mg by mouth 2 (two)  times daily.   atorvastatin 40 MG tablet Commonly known as: LIPITOR Take 40 mg by mouth daily.   Baclofen 5 MG Tabs Take 5 mg by mouth 3 (three) times daily.   Biofreeze 4 % Gel Generic drug: Menthol (Topical Analgesic) Apply 1 application topically 2 (two) times daily. Apply to left shoulder   bisacodyl 10 MG suppository Commonly known as: DULCOLAX Place 1 suppository (10 mg total) rectally daily as needed for moderate constipation.   Cholecalciferol 1.25 MG (50000 UT) capsule Take 1 capsule (50,000 Units total) by mouth once a week. Once a day on mon What changed: when to take this   coal tar 0.5 % shampoo Commonly known as: NEUTROGENA T-GEL Apply 1 application topically See admin instructions. Apply to scalp on shower days twice a week Wed And Sat prn   feeding supplement (PRO-STAT 64) Liqd Take 30 mLs by mouth daily.   gabapentin 100 MG capsule Commonly known as: NEURONTIN Take 200 mg by mouth at bedtime.   levETIRAcetam 500 MG tablet Commonly known as: KEPPRA Take 1 tablet (500 mg total) by mouth 2 (two) times daily.   metoprolol tartrate 25 MG tablet Commonly known as: LOPRESSOR Take 12.5 mg by mouth 2 (two) times daily.   omeprazole 40 MG capsule Commonly known as: PRILOSEC Take 40 mg by mouth daily.   ROBITUSSIN COUGH+CHEST CONG DM PO Take 10 mLs by mouth every 6 (six) hours as needed.   senna 8.6 MG Tabs tablet Commonly known as: SENOKOT Take 2 tablets by mouth 2 (two) times daily.      Allergies  Allergen Reactions  . Cheese   . Penicillins Hives  Has patient had a PCN reaction causing immediate rash, facial/tongue/throat swelling, SOB or lightheadedness with hypotension: Unknown Has patient had a PCN reaction causing severe rash involving mucus membranes or skin necrosis: Unknown Has patient had a PCN reaction that required hospitalization: No Has patient had a PCN reaction occurring within the last 10 years: No If all of the above answers are  "NO", then may proceed with Cephalosporin use.     Follow-up Information    Gerlene Fee, NP.   Specialty: Geriatric Medicine Contact information: [place] [address] [phone]        Phillips Odor, MD. Schedule an appointment as soon as possible for a visit in 4 week(s).   Specialty: Neurology Contact information: [address] [place] [address] [phone]                The results of significant diagnostics from this hospitalization (including imaging, microbiology, ancillary and laboratory) are listed below for reference.    Significant Diagnostic Studies: CT Head Wo Contrast  Result Date: 03/28/2020 CLINICAL DATA:  Acute seizure.  History of trauma. EXAM: CT HEAD WITHOUT CONTRAST TECHNIQUE: Contiguous axial images were obtained from the base of the skull through the vertex without intravenous contrast. COMPARISON:  08/24/2018 FINDINGS: Brain: Extensive remote infarct to the right cerebral hemisphere affecting all of the MCA territory and also extending into the distal ACA territory and involving the right occipital lobe. Marked ex vacuo dilatation of the right lateral ventricle. Smaller and also remote left occipital parietal cortex infarct. Associated wallerian degeneration at the right cerebral peduncle. No evidence of acute infarct, acute hemorrhage, obstructive hydrocephalus, or mass. Vascular: No hyperdense vessel or unexpected calcification. Skull: Right craniectomy.  No acute or aggressive finding. Sinuses/Orbits: Negative IMPRESSION: 1. No acute finding. 2. Extensive remote infarct in the right more than left cerebrum. Electronically Signed   By: Monte Fantasia M.D.   On: 03/28/2020 11:30    Microbiology: Recent Results (from the past 240 hour(s))  SARS Coronavirus 2 by RT PCR (hospital order, performed in [facility] hospital lab) Nasopharyngeal Nasopharyngeal Swab     Status: None   Collection Time: 03/28/20 12:21 PM    Specimen: Nasopharyngeal Swab  Result Value Ref Range Status   SARS Coronavirus 2 NEGATIVE NEGATIVE Final    Comment: (NOTE) SARS-CoV-2 target nucleic acids are NOT DETECTED.  The SARS-CoV-2 RNA is generally detectable in upper and lower respiratory specimens during the acute phase of infection. The lowest concentration of SARS-CoV-2 viral copies this assay can detect is 250 copies / mL. A negative result does not preclude SARS-CoV-2 infection and should not be used as the sole basis for treatment or other patient management decisions.  A negative result may occur with improper specimen collection / handling, submission of specimen other than nasopharyngeal swab, presence of viral mutation(s) within the areas targeted by this assay, and inadequate number of viral copies (<250 copies / mL). A negative result must be combined with clinical observations, patient history, and epidemiological information.  Fact Sheet for Patients:   StrictlyIdeas.no  Fact Sheet for Healthcare Providers: BankingDealers.co.za  This test is not yet approved or  cleared by the [place] FDA and has been authorized for detection and/or diagnosis of SARS-CoV-2 by FDA under an Emergency Use Authorization (EUA).  This EUA will remain in effect (meaning this test can be used) for the duration of the COVID-19 declaration under Section 564(b)(1) of the Act, 21 U.S.C. section 360bbb-3(b)(1), unless  the authorization is terminated or revoked sooner.  Performed at [facility], [address], [place], [place] [address]      Labs: Basic Metabolic Panel: Recent Labs  Lab 03/28/20 0954 03/28/20 1808 03/29/20 0657  NA 137  --  137  K 4.4  --  3.9  CL 105  --  107  CO2 21*  --  22  GLUCOSE 153*  --  113*  BUN 16  --  15  CREATININE 0.99  --  0.69  CALCIUM 9.6  --  9.3  MG  --  2.1  --   PHOS  --  2.8  --    CBC: Recent Labs  Lab 03/28/20 0954  03/29/20 0657  WBC 15.5* 13.4*  NEUTROABS 9.1*  --   HGB 15.6 14.7  HCT 51.7 49.1  MCV 93.5 93.3  PLT PLATELET CLUMPS NOTED ON SMEAR, UNABLE TO ESTIMATE 172   CBG: Recent Labs  Lab 03/28/20 2145 03/29/20 0719  GLUCAP 142* 106*    Signed:  Barton Dubois MD.  [facility] 03/29/2020, 2:49 PM

## 2020-03-29 NOTE — TOC Transition Note (Signed)
Transition of Care [facility]) - CM/SW Discharge Note   Patient Details  Name: Joe Wilkinson MRN: [id number] Date of Birth: 12/19/1960  Transition of Care [facility]) CM/SW Contact:  Natasha Bence, LCSW Phone Number: 03/29/2020, 2:22 PM   Clinical Narrative:    CSW notified [place] of patient's discharge. [facility] agreeable to accept patient back to facility. [facility] will provide transport to facility. CSW has notified patient's sister of transfer. TOC signing off.    Final next level of care: [place] Barriers to Discharge: Barriers Resolved   Patient Goals and CMS Choice Patient states their goals for this hospitalization and ongoing recovery are:: Return to SNF      Discharge Placement              Patient [facility] bed at: [facility] Patient to be transferred to facility by: [facility] Name of family member notified: Willeen Niece Patient and family notified of of transfer: 03/29/20  Discharge Plan and Services                DME Arranged: Gilford Rile tall                    Social Determinants of Health (SDOH) Interventions     Readmission Risk Interventions No flowsheet data found.

## 2020-03-30 ENCOUNTER — Other Ambulatory Visit: Payer: Self-pay | Admitting: Adult Health

## 2020-03-30 ENCOUNTER — Non-Acute Institutional Stay (SKILLED_NURSING_FACILITY): Payer: Medicaid Other | Admitting: Adult Health

## 2020-03-30 ENCOUNTER — Encounter: Payer: Self-pay | Admitting: Adult Health

## 2020-03-30 DIAGNOSIS — I69354 Hemiplegia and hemiparesis following cerebral infarction affecting left non-dominant side: Secondary | ICD-10-CM

## 2020-03-30 DIAGNOSIS — E1169 Type 2 diabetes mellitus with other specified complication: Secondary | ICD-10-CM

## 2020-03-30 DIAGNOSIS — E1159 Type 2 diabetes mellitus with other circulatory complications: Secondary | ICD-10-CM | POA: Diagnosis not present

## 2020-03-30 DIAGNOSIS — K5909 Other constipation: Secondary | ICD-10-CM

## 2020-03-30 DIAGNOSIS — E559 Vitamin D deficiency, unspecified: Secondary | ICD-10-CM

## 2020-03-30 DIAGNOSIS — I480 Paroxysmal atrial fibrillation: Secondary | ICD-10-CM | POA: Diagnosis not present

## 2020-03-30 DIAGNOSIS — Z9889 Other specified postprocedural states: Secondary | ICD-10-CM | POA: Diagnosis not present

## 2020-03-30 DIAGNOSIS — E1149 Type 2 diabetes mellitus with other diabetic neurological complication: Secondary | ICD-10-CM

## 2020-03-30 DIAGNOSIS — K219 Gastro-esophageal reflux disease without esophagitis: Secondary | ICD-10-CM

## 2020-03-30 DIAGNOSIS — I63411 Cerebral infarction due to embolism of right middle cerebral artery: Secondary | ICD-10-CM

## 2020-03-30 DIAGNOSIS — I10 Essential (primary) hypertension: Secondary | ICD-10-CM

## 2020-03-30 DIAGNOSIS — E1142 Type 2 diabetes mellitus with diabetic polyneuropathy: Secondary | ICD-10-CM

## 2020-03-30 DIAGNOSIS — E785 Hyperlipidemia, unspecified: Secondary | ICD-10-CM

## 2020-03-30 LAB — PROLACTIN: Prolactin: 12.2 ng/mL (ref 4.0–15.2)

## 2020-03-30 LAB — GLUCOSE, CAPILLARY
Glucose-Capillary: 164 mg/dL — ABNORMAL HIGH (ref 70–99)
Glucose-Capillary: 151 mg/dL — ABNORMAL HIGH (ref 70–99)

## 2020-03-30 MED ORDER — LORAZEPAM 2 MG/ML IJ SOLN: 1.0000 mg | INTRAMUSCULAR | 0 refills | Status: DC | PRN

## 2020-03-30 NOTE — Progress Notes (Signed)
Location:    [place] Room Number: 117/D Place of Service:  SNF (31)   CODE STATUS: Full Code  Allergies  Allergen Reactions  . Cheese   . Penicillins Hives    Has patient had a PCN reaction causing immediate rash, facial/tongue/throat swelling, SOB or lightheadedness with hypotension: Unknown Has patient had a PCN reaction causing severe rash involving mucus membranes or skin necrosis: Unknown Has patient had a PCN reaction that required hospitalization: No Has patient had a PCN reaction occurring within the last 10 years: No If all of the above answers are "NO", then may proceed with Cephalosporin use.     Chief Complaint  Patient presents with  . Hospitalization Follow-up    Hospitalization Follow Up    HPI:  He is a 59 year old long term resident of this facility who has been hospitalized from 03-28-20 through 03-29-20 for new onset seizures. He was started on keppra however he is unable to take his medications whole. In order to ensure his dose is accurate with liquid will change him to depakote sprinkles. He states that he has had 2 seizures one here and one in the ED.  He denies any pain. He denies any constipation; no heart burn. He will continue to be followed for his chronic illnesses including: cva; afib; diabetes.   Past Medical History:  Diagnosis Date  . DM (diabetes mellitus) ([place])   . Hypertension   . Stroke [facility])     Past Surgical History:  Procedure Laterality Date  . CRANIECTOMY Right 04/01/2017   Procedure: RIGHT DECOMPRESSIVE CRANIECTOMY;  Surgeon: Ditty, Kevan Ny, MD;  Location: [place];  Service: Neurosurgery;  Laterality: Right;  . ESOPHAGOGASTRODUODENOSCOPY N/A 04/13/2017   Procedure: ESOPHAGOGASTRODUODENOSCOPY (EGD);  Surgeon: Georganna Skeans, MD;  Location: [place];  Service: General;  Laterality: N/A;  bedside  . INCISION AND DRAINAGE PERIRECTAL ABSCESS N/A 09/28/2017   Procedure: IRRIGATION AND DEBRIDEMENT PERIANAL  ABSCESS;  Surgeon: Virl Cagey, MD;  Location: [facility];  Service: General;  Laterality: N/A;  . PEG PLACEMENT N/A 04/13/2017   Procedure: PERCUTANEOUS ENDOSCOPIC GASTROSTOMY (PEG) PLACEMENT;  Surgeon: Georganna Skeans, MD;  Location: [facility] ENDOSCOPY;  Service: General;  Laterality: N/A;    Social History   Socioeconomic History  . Marital status: Unknown    Spouse name: Not on file  . Number of children: Not on file  . Years of education: Not on file  . Highest education level: Not on file  Occupational History  . Not on file  Tobacco Use  . Smoking status: Former Smoker    Types: Cigarettes  . Smokeless tobacco: Never Used  . Tobacco comment: UTA  Vaping Use  . Vaping Use: Never used  Substance and Sexual Activity  . Alcohol use: No    Comment: UTA  . Drug use: No    Comment: UTA  . Sexual activity: Not Currently    Birth control/protection: None  Other Topics Concern  . Not on file  Social History Narrative  . Not on file   Social Determinants of Health   Financial Resource Strain:   . Difficulty of Paying Living Expenses: Not on file  Food Insecurity:   . Worried About Charity fundraiser in the Last Year: Not on file  . Ran Out of Food in the Last Year: Not on file  Transportation Needs:   . Lack of Transportation (Medical): Not on file  . Lack of Transportation (Non-Medical): Not on file  Physical Activity:   . Days of Exercise per Week: Not on file  . Minutes of Exercise per Session: Not on file  Stress:   . Feeling of Stress : Not on file  Social Connections:   . Frequency of Communication with Friends and Family: Not on file  . Frequency of Social Gatherings with Friends and Family: Not on file  . Attends Religious Services: Not on file  . Active Member of Clubs or Organizations: Not on file  . Attends Archivist Meetings: Not on file  . Marital Status: Not on file  Intimate Partner Violence:   . Fear of Current or Ex-Partner: Not on file  .  Emotionally Abused: Not on file  . Physically Abused: Not on file  . Sexually Abused: Not on file   Family History  Problem Relation Age of Onset  . Hypertension Father       VITAL SIGNS BP 132/74   Pulse 74   Temp 97.7 F (36.5 C) (Oral)   Resp 20   Ht 5\' 6"  (6.834 m)   Wt 178 lb 12.8 oz (81.1 kg)   BMI 28.86 kg/m   Outpatient Encounter Medications as of 03/30/2020  Medication Sig  . acetaminophen (TYLENOL) 325 MG tablet Take 650 mg by mouth every 4 (four) hours as needed. Not to exceed 3 grams in 24 hours  . Amino Acids-Protein Hydrolys (FEEDING SUPPLEMENT, PRO-STAT 64,) LIQD Take 30 mLs by mouth daily.   Marland Kitchen apixaban (ELIQUIS) 5 MG TABS tablet Take 5 mg by mouth 2 (two) times daily.   Marland Kitchen atorvastatin (LIPITOR) 40 MG tablet Take 40 mg by mouth daily.  . Baclofen 5 MG TABS Take 5 mg by mouth 3 (three) times daily.  . bisacodyl (DULCOLAX) 10 MG suppository Place 1 suppository (10 mg total) rectally daily as needed for moderate constipation.  . Cholecalciferol 1.25 MG (50000 UT) capsule Take 1 capsule (50,000 Units total) by mouth once a week. Once a day on mon  . coal tar (NEUTROGENA T-GEL) 0.5 % shampoo Apply 1 application topically See admin instructions. Apply to scalp on shower days twice a week Wed And Sat prn  . Dextromethorphan-guaiFENesin (ROBITUSSIN COUGH+CHEST CONG DM PO) Take 10 mLs by mouth every 6 (six) hours as needed.  . divalproex (DEPAKOTE SPRINKLE) 125 MG capsule Take 750 mg by mouth every 8 (eight) hours. Special Instructions: for new onset seizures  . gabapentin (NEURONTIN) 100 MG capsule Take 200 mg by mouth at bedtime.   . Menthol, Topical Analgesic, (BIOFREEZE) 4 % GEL Apply 1 application topically 2 (two) times daily. Apply to left shoulder   . metoprolol tartrate (LOPRESSOR) 25 MG tablet Take 12.5 mg by mouth 2 (two) times daily.  . NON FORMULARY Regular Diet  . omeprazole (PRILOSEC) 40 MG capsule Take 40 mg by mouth daily.   Marland Kitchen senna (SENOKOT) 8.6 MG TABS  tablet Take 2 tablets by mouth 2 (two) times daily.   . [DISCONTINUED] levETIRAcetam (KEPPRA) 500 MG tablet Take 1 tablet (500 mg total) by mouth 2 (two) times daily.   No facility-administered encounter medications on file as of 03/30/2020.     SIGNIFICANT DIAGNOSTIC EXAMS  PREVIOUS:   08-24-18: ct of head: 1. 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 Increased volume loss of the infarcted brain as well as decreased herniation of brain and dura through the craniectomy defect. 2. No new intracranial abnormality.  NO NEW EXAMS.   LABS REVIEWED PREVIOUS:   05-20-19:  wbc 9.1; hgb 12.6; hct 43.6; mcv 87.6 plt 196; chol 151; ldl 84; trig 185; hdl 30 vit D 24.00 PSA 1.60 07-29-19: : glucose 97; bun 11; creat 0.85; k+ 4.1; na++ 140; ca 9.2 liver normal albumin 3.2 hgb a1c 4.6 08-21-19: vit D 24.16  11-07-19: vit D 46.75 12-05-19: wbc 10.5; hgb 14.2; hct 47.5; mcv 94.1 plt 158; glucose 84; bun 11; creat 0.80 k+ 4.1; na++ 140; ca 9.2 liver normal albumin 3.1 hgb a1c 4.9 chol 132; ldl 77; trig 110; hdl 33   TODAY  01-09-20: vit D 43.63 02-06-20: urine micro-albumin: 27.4  03-13-20: glucose 116; bun 12; creat 0.67; k+ 3.8; na++ 136; ca 8.8 liver normal albumin 3.1 vit D 29.72 03-28-20: wbc 15.5; hgb 15.6; hct 51.7; mcv 93.5 plt clump; glucose 153; bun 16; creat 0.99; k+ 4.4; na++ 137; ca 9.6 tsh 0.811; hgb a1c 4.9;  03-29-20: chol 143 ldl 96 trig 83 hdl 30 HIV nr   Review of Systems  Constitutional: Negative for malaise/fatigue.  Respiratory: Negative for cough and shortness of breath.   Cardiovascular: Negative for chest pain, palpitations and leg swelling.  Gastrointestinal: Negative for abdominal pain, constipation and heartburn.  Musculoskeletal: Negative for back pain, joint pain and myalgias.  Skin: Negative.   Neurological: Negative for dizziness.  Psychiatric/Behavioral: The patient is not nervous/anxious.      Physical Exam Constitutional:      General:  He is not in acute distress.    Appearance: He is well-developed. He is not diaphoretic.  Neck:     Thyroid: No thyromegaly.  Cardiovascular:     Rate and Rhythm: Normal rate and regular rhythm.     Pulses: Normal pulses.     Heart sounds: Normal heart sounds.  Pulmonary:     Effort: Pulmonary effort is normal. No respiratory distress.     Breath sounds: Normal breath sounds.  Abdominal:     General: Bowel sounds are normal. There is no distension.     Palpations: Abdomen is soft.     Tenderness: There is no abdominal tenderness.  Musculoskeletal:     Cervical back: Neck supple.     Right lower leg: No edema.     Left lower leg: No edema.     Comments: Left hemiplegia with stiffness present      Lymphadenopathy:     Cervical: No cervical adenopathy.  Skin:    General: Skin is warm and dry.  Neurological:     Mental Status: He is alert. Mental status is at baseline.  Psychiatric:        Mood and Affect: Mood normal.        ASSESSMENT/ PLAN:  TODAY;   1. Seizures: is stable will stop keppra and will begin depakote sprinkles 30 mg/kg daily 750 mg every 8 hours. Will check cbc; cmp depakote level on 04-06-20.  2. 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 GERD without esophagitis: is stable will continue prilosec 40 mg daily   4. Dyslipidemia associated with type 2 diabetes mellitus is stable LDL 96 will continue lipitor 40 mg daily    5, AF (paroxsymal atrial fibrillation) heart rate is stable will continue lopressor 12.5 mg twice daily for rate control eliquis 5 mg twice daily   6. Hypertension associated with type 2 diabetes mellitus: is stable b/p 132/74 will continue lopressor  12.5 mg twice daily   7. Type 2 diabetes mellitus with neurological complications: is stable hgb a1c 4.9 diet controlled  8. Diabetic  peripheral neuropathy: is stable will continue gabapentin 200 mg nightly   9. Chronic constipation is stable will continue senna 2 tabs twice daily   10. Cerebrovascular accident (CVA) due to embolism of right middle cerebral artery: s/p compression right frontotemporoparietal craniotomy with duroplasty 2018: is stable will continue eliquis 5 mg twice daily   11. Vit D deficiency level 29.72 will continue 50,000 units weekly     MD is aware of resident's narcotic use and is in agreement with current plan of care. We will attempt to wean resident as appropriate.  Ok Edwards NP [facility]  Contact [phone] Monday through Friday 8am- 5pm  After hours call [phone]

## 2020-04-01 ENCOUNTER — Encounter: Payer: Self-pay | Admitting: Internal Medicine

## 2020-04-01 ENCOUNTER — Non-Acute Institutional Stay (SKILLED_NURSING_FACILITY): Payer: Medicaid Other | Admitting: Internal Medicine

## 2020-04-01 DIAGNOSIS — K219 Gastro-esophageal reflux disease without esophagitis: Secondary | ICD-10-CM | POA: Diagnosis not present

## 2020-04-01 DIAGNOSIS — I480 Paroxysmal atrial fibrillation: Secondary | ICD-10-CM

## 2020-04-01 DIAGNOSIS — K5909 Other constipation: Secondary | ICD-10-CM

## 2020-04-01 DIAGNOSIS — R569 Unspecified convulsions: Secondary | ICD-10-CM

## 2020-04-01 NOTE — Progress Notes (Signed)
NURSING HOME LOCATION: [place] ROOM NUMBER:  117/D  CODE STATUS:Full Code    PCP:  Gerlene Fee, NP   This is a comprehensive admission note to [facility] performed on this date less than 30 days from date of admission. Included are preadmission medical/surgical history; reconciled medication list; family history; social history and comprehensive review of systems.  Corrections and additions to the records were documented. Comprehensive physical exam was also performed. Additionally a clinical summary was entered for each active diagnosis pertinent to this admission in the Problem List to enhance continuity of care.  HPI: Patient was hospitalized 8/21-8/22/2021 for new onset seizure activity.  This is in the context of prior history of CVA due to embolism of the right MCA.  Transiently the patient was postictal and poorly responsive after the seizure event.  He received Ativan x1 and a loading dose with Keppra as per Dr. Merlene Laughter via curbside consultation.  CT of the head without contrast revealed no acute intracranial abnormalities.  Chemistries and electrolytes were at baseline. A1c was 4.9% indicating prediabetic status. Eliquis was continued for PAF. Neurology follow-up was to be in 4 weeks post discharge to reassess antiepileptic drug therapy and for EEG.  Past medical and surgical history: Includes PAF, hemiparesis as complication of CVA, GERD, dyslipidemia, essential hypertension, and diabetes with neurologic complication. 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  I&D of perirectal abscess was performed in 2019.  Social history: Nondrinker, former smoker.  Family history: Limited history reviewed.   Review of systems:  Date given as "April 01, 2021".  He was able to identify the president.  He was aware that he was in the hospital last week with a seizure.  He describes some nasal discharge which is white and  clear.  He also describes a nonproductive cough intermittently.  He has dysphagia only with the ingestion of ice cream.  He states that he has intermittent mid abdominal pain which he describes as sharp and lasting 5 minutes.  It is nonradiating.  It is not related to food intake or bowel movements.  He is constipated by history.  Constitutional: No fever, significant weight change, fatigue  Eyes: No redness, discharge, pain, vision change ENT/mouth: No  purulent discharge, earache, change in hearing, sore throat  Cardiovascular: No chest pain, palpitations, paroxysmal nocturnal dyspnea, claudication, edema  Respiratory: No  sputum production, hemoptysis, significant snoring, apnea  Gastrointestinal: No heartburn,  nausea /vomiting, rectal bleeding, melena, change in bowels Genitourinary: No dysuria, hematuria, pyuria, incontinence, nocturia Musculoskeletal: No joint stiffness, joint swelling, weakness, pain Dermatologic: No rash, pruritus, change in appearance of skin Neurologic: No dizziness, headache, syncope Psychiatric: No significant anxiety, depression, insomnia, anorexia Endocrine: No change in hair/skin/nails, excessive thirst, excessive hunger, excessive urination  Hematologic/lymphatic: No significant bruising, lymphadenopathy, abnormal bleeding Allergy/immunology: No itchy/watery eyes, significant sneezing, urticaria, angioedema  Physical exam:  Pertinent or positive findings: Facies are somewhat blank.  He has ptosis on the right.  Maxilla is edentulous.  He has expiratory low-grade rhonchi with forced expiration.  Clinically heart rhythm is regular.  Bowel sounds are normal.  There is no AAA.  He is diffusely tender throughout the abdomen.  There are no palpable masses or organomegaly.  Pedal pulses are decreased.  His feet are in booties.  He has contractures of the left upper extremity with no range of motion in the left upper or left lower extremities.  Strength in the right  extremities  is fair to opposition.  General appearance: Adequately nourished; no acute distress, increased work of breathing is present.   Lymphatic: No lymphadenopathy about the head, neck, axilla. Eyes: No conjunctival inflammation or lid edema is present. There is no scleral icterus. Ears:  External ear exam shows no significant lesions or deformities.   Nose:  External nasal examination shows no deformity or inflammation. Nasal mucosa are pink and moist without lesions, exudates Oral exam: Lips and gums are healthy appearing.There is no oropharyngeal erythema or exudate. Neck:  No thyromegaly, masses, tenderness noted.    Heart:  No gallop, murmur, click, rub.  Lungs:  without wheezes, rales, rubs. GU: Deferred  Extremities:  No cyanosis, clubbing, edema. Neurologic exam:  Balance, Rhomberg, finger to nose testing could not be completed due to clinical state Skin: Warm & dry w/o tenting. No significant lesions or rash.  See clinical summary under each active problem in the Problem List with associated updated therapeutic plan

## 2020-04-01 NOTE — Assessment & Plan Note (Addendum)
See 04/01/2020.  He describes intermittent mid abdominal pain which is sharp and nonradiating without relationship to food intake or meals.  On exam he is diffusely tender but the abdominal exam is clinically benign. CBC and differential and TSH while hospitalized 8/21-8/22 were normal. Clinically findings would suggest that his discomfort is related to constipation in view of the negative abdominal exam. Stool monitor for severity of constipation and bleeding or melena will be requested.

## 2020-04-01 NOTE — Assessment & Plan Note (Signed)
No recurrence of seizures.

## 2020-04-01 NOTE — Patient Instructions (Signed)
See assessment and plan under each diagnosis in the problem list and acutely for this visit 

## 2020-04-01 NOTE — Assessment & Plan Note (Signed)
04/01/2020 he describes dysphagia only with ice cream.

## 2020-04-01 NOTE — Assessment & Plan Note (Signed)
Clinically heart rhythm is regular at this time.  Eliquis prophylaxis continued.

## 2020-04-02 ENCOUNTER — Encounter: Payer: Self-pay | Admitting: Adult Health

## 2020-04-02 ENCOUNTER — Ambulatory Visit (INDEPENDENT_AMBULATORY_CARE_PROVIDER_SITE_OTHER): Payer: Medicaid Other | Admitting: Adult Health

## 2020-04-02 VITALS — BP 124/84 | HR 95

## 2020-04-02 DIAGNOSIS — G40909 Epilepsy, unspecified, not intractable, without status epilepticus: Secondary | ICD-10-CM | POA: Diagnosis not present

## 2020-04-02 DIAGNOSIS — Z9889 Other specified postprocedural states: Secondary | ICD-10-CM | POA: Diagnosis not present

## 2020-04-02 DIAGNOSIS — Z8673 Personal history of transient ischemic attack (TIA), and cerebral infarction without residual deficits: Secondary | ICD-10-CM | POA: Diagnosis not present

## 2020-04-02 DIAGNOSIS — I69398 Other sequelae of cerebral infarction: Secondary | ICD-10-CM | POA: Diagnosis not present

## 2020-04-02 DIAGNOSIS — R569 Unspecified convulsions: Secondary | ICD-10-CM

## 2020-04-02 NOTE — Patient Instructions (Addendum)
Your Plan:  Continue depakote 750mg  every 8 hours for seizure preventions Plans on repeat level 8/30  Order placed for EEG - scheduled at end of visit today  Continue eliquis and atorvastatin for secondary stroke prevention Ensure ongoing management of HTN and HLD for secondary stroke prevention   Follow up in 6 months     Thank you for coming to see [place] at [facility]. I hope we have been able to provide you high quality care today.  You may receive a patient satisfaction survey over the next few weeks. We would appreciate your feedback and comments so that we may continue to improve ourselves and the health of our patients.

## 2020-04-02 NOTE — Progress Notes (Signed)
[facility]  PATIENT: Joe Wilkinson DOB: 03-Jul-1961   REASON FOR VISIT: Follow-up for history of stroke August 2018 HISTORY FROM: Patient   Chief Complaint  Patient presents with  . Follow-up    38yr f/u. States he had a seizure last Saturday.   . treatment room    alone      HISTORY OF PRESENT ILLNESS: 07/11/17 Joe Wilkinson is a 59 year african [place] male seen today for first office follow-up visit following hospital admission for stroke in August 2018.  He is accompanied by [facility] nursing home attendant where he stays. No family is available. I have personally reviewed the electronic medical records and imaging films. Joe Wilkinson an 59 y.o.malewho presented to [facility] in DKA. He was found lethargic at a bus stop and then became unresponsive. EMS reported O2 sat of 80% and BP of 90/40. His CBG revealed hyperglycemia. On arrival to [facility] the patient patient could tell the examiner his name but was unable to provide any other information. He was restless, moaning and moving all extremities, not following commands. Labs were consistent with DKA and he was started on insulin drip. Initial CT head at [facility] showed no acute abnormality. He developed Kussmaul breathing requiring intubation. 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 He was transported to [facility] for further care. He   developed hypotension requiring pressors.  He was taken for an emergent right hemi-craniectomy for malignant cerebral edema by Dr. Cyndy Freeze and had a prolonged neuro ICU stay which was complicated by acute respiratory failure with hypoxia, hypercarbia, septic shock secondary to Streptococcus agalactiae  pneumonia.  He also developed new onset atrial fibrillation with rapid ventricular rate.  E. coli and Klebsiella  urinary tract infection.  Perianal cellulitis with small abscess.  He had a prolonged ICU stay and was eventually weaned off ventilatory support.  Follow-up CT scan showed significant right hemispheric edema with herniation out outside craniotomy bone defect.  He underwent PEG tube placement by trauma team.  He was discharged on 05/29/17 to pain center for rehabilitation.  He is mental status has improved and he can speak and communicate but has persistent spastic dense hemiplegia.  He develops intermittent confusion.  He is still has persistent right-sided scalp swelling and has been seen in neurosurgery follow-up clinic on 06/21/17 and the decision to do cranioplasty has been postponed for a few more months until the swelling subsides.  He can eat a dysphagia 3 diet but is still getting mostly PEG tube feeds.  Patient has had a few falls.  He remains full support and requires 2 person support to stand and take a few steps.  He is getting ongoing physical occupational and speech therapy. UPDATE 3/5/2019[name], 59 year old male returns for follow-up with history of stroke in August 2018.  He remains on Eliquis for secondary stroke prevention and atrial fibrillation.  He was readmitted to the hospital on 09/24/2017 for sepsis and perianal abscess.  His PEG tube was dislodged.  He continues to have hemiparesis affecting the left side from his stroke event.  He continues to have a persistent right-sided scalp swelling and cranioplasty has been postponed until the swelling subsides.  He remains on a puree  diet and gets PEG feedings at night. Patient is wheelchair-bound.  His physical therapy was stopped because patient  was not making progress.  No recent falls.  He is dependent for all activities of daily living.   He returns for reevaluation UPDATE 9/5/2019[name], 59 year old male returns for follow-up with a history of stroke event in August 2018.  He is currently residing in a skilled facility.  He  remains on Eliquis for secondary stroke prevention and atrial fibrillation.  He has not had further stroke or TIA symptoms.  He has a PEG tube with feedings at  night.  He is on a regular diet.  He continues to have significant left hemiparesis and a persistent right sided scalp swelling.  His cranioplasty has been postponed.  He sees a physician at [facility] he is due for follow-up in 6 months the patient says.  He is wheelchair-bound transfers with a Hoyer lift.  No recent falls.  He remains dependent for all activities of daily living except feeding himself.  Blood pressure in the office today 151/94.  Most recent hemoglobin A1c done 12/06/17  was 4.5.  Reviewed recent CBC BMP from 03/28/2018 within normal limits.  Vitamin D level 30.2 he returns for reevaluation.  04/02/2019 update: Joe Wilkinson is a 59 year old male who is being seen today for stroke follow-up.  Residual deficits of left spastic hemiplegia and persistent right-sided scalp swelling.  Per patient report, will be having additional imaging to potentially undergo cranioplasty but is unsure when this will occur.  He continues to be followed in [facility] by neurosurgery.  He is wheelchair-bound due to residual deficits and dependent for majority of care.  Currently on baclofen 5 mg twice daily for spasticity pain but does endorse ongoing pain with only mild improvement.  Blood pressure today 154/78.  Continues on Eliquis for secondary stroke prevention and atrial fibrillation without bleeding or bruising.  Continues on atorvastatin 20 mg daily without myalgias.  He continues to reside at [facility].  No further concerns at this time.  04/02/2020: Joe Wilkinson returns for 1 year follow-up as well as recent hospitalization for seizure-like episode He continues with [facility] and is unaccompanied at today's visit  Presented from SNF on 03/28/2020 for seizure activity upon awakening followed by postictal state.  Initiated on Keppra twice daily and  requested EEG be obtained outpatient  He has not had any additional seizure activity since that time Keppra changed to Depakote by facility due to inability to swallow oral pills and concern for inadequate dose through liquid form Currently on Depakote 750 mg every 8 hours tolerating well without side effects Facility plans on obtaining valproic acid level on 8/30  Residual stroke deficits left spastic hemiplegia and left peripheral loss has been stable without worsening Remains nonambulatory and transfers via wheelchair Remains on Eliquis and atorvastatin for secondary stroke prevention without side effects Blood pressure today 124/84  He apparently had follow-up with [facility] neurosurgery and was told they could not complete cranioplasty but unable to verify reason by patient. Skullcap from abdomen removed.  No further concerns    REVIEW OF SYSTEMS: Full 14 system review of systems performed and notable only for those listed, all others are neg:  Constitutional: neg  Cardiovascular: neg Ear/Nose/Throat: neg  Skin: neg Eyes: neg Respiratory: neg Gastroitestinal: neg  Hematology/Lymphatic: neg  Endocrine: neg Musculoskeletal: neg Allergy/Immunology: neg Neurological: Left hemiplegia, left hemianopia and seizures Psychiatric: neg Sleep : neg   ALLERGIES: Allergies  Allergen Reactions  . Cheese   . Penicillins Hives    Has patient had a PCN  reaction causing immediate rash, facial/tongue/throat swelling, SOB or lightheadedness with hypotension: Unknown Has patient had a PCN reaction causing severe rash involving mucus membranes or skin necrosis: Unknown Has patient had a PCN reaction that required hospitalization: No Has patient had a PCN reaction occurring within the last 10 years: No If all of the above answers are "NO", then may proceed with Cephalosporin use.     HOME MEDICATIONS: Outpatient Medications Prior to Visit  Medication Sig Dispense Refill  .  acetaminophen (TYLENOL) 325 MG tablet Take 650 mg by mouth every 4 (four) hours as needed. Not to exceed 3 grams in 24 hours    . Amino Acids-Protein Hydrolys (FEEDING SUPPLEMENT, PRO-STAT 64,) LIQD Take 30 mLs by mouth daily.     Marland Kitchen apixaban (ELIQUIS) 5 MG TABS tablet Take 5 mg by mouth 2 (two) times daily.     Marland Kitchen atorvastatin (LIPITOR) 40 MG tablet Take 40 mg by mouth daily.    . Baclofen 5 MG TABS Take 5 mg by mouth 3 (three) times daily. 90 tablet 3  . bisacodyl (DULCOLAX) 10 MG suppository Place 1 suppository (10 mg total) rectally daily as needed for moderate constipation. 12 suppository 0  . Cholecalciferol 1.25 MG (50000 UT) capsule Take 1 capsule (50,000 Units total) by mouth once a week. Once a day on mon    . coal tar (NEUTROGENA T-GEL) 0.5 % shampoo Apply 1 application topically See admin instructions. Apply to scalp on shower days twice a week Wed And Sat prn    . Dextromethorphan-guaiFENesin (ROBITUSSIN COUGH+CHEST CONG DM PO) Take 10 mLs by mouth every 6 (six) hours as needed.    . divalproex (DEPAKOTE SPRINKLE) 125 MG capsule Take 750 mg by mouth every 8 (eight) hours. Special Instructions: for new onset seizures    . gabapentin (NEURONTIN) 100 MG capsule Take 200 mg by mouth at bedtime.     Marland Kitchen LORazepam (ATIVAN) 2 MG/ML injection Inject 0.5 mLs (1 mg total) into the muscle every 15 (fifteen) minutes as needed. Up to 3 doses for uncontrolled seizure activity 3 mL 0  . Menthol, Topical Analgesic, (BIOFREEZE) 4 % GEL Apply 1 application topically 2 (two) times daily. Apply to left shoulder     . metoprolol tartrate (LOPRESSOR) 25 MG tablet Take 12.5 mg by mouth 2 (two) times daily.    . NON FORMULARY Regular Diet    . omeprazole (PRILOSEC) 40 MG capsule Take 40 mg by mouth daily.     Marland Kitchen senna (SENOKOT) 8.6 MG TABS tablet Take 2 tablets by mouth 2 (two) times daily.      No facility-administered medications prior to visit.    PAST MEDICAL HISTORY: Past Medical History:  Diagnosis Date   . DM (diabetes mellitus) ([place])   . Hypertension   . Stroke [facility])     PAST SURGICAL HISTORY: Past Surgical History:  Procedure Laterality Date  . CRANIECTOMY Right 04/01/2017   Procedure: RIGHT DECOMPRESSIVE CRANIECTOMY;  Surgeon: Ditty, Kevan Ny, MD;  Location: [place];  Service: Neurosurgery;  Laterality: Right;  . ESOPHAGOGASTRODUODENOSCOPY N/A 04/13/2017   Procedure: ESOPHAGOGASTRODUODENOSCOPY (EGD);  Surgeon: Georganna Skeans, MD;  Location: [place];  Service: General;  Laterality: N/A;  bedside  . INCISION AND DRAINAGE PERIRECTAL ABSCESS N/A 09/28/2017   Procedure: IRRIGATION AND DEBRIDEMENT PERIANAL ABSCESS;  Surgeon: Virl Cagey, MD;  Location: [facility];  Service: General;  Laterality: N/A;  . PEG PLACEMENT N/A 04/13/2017   Procedure: PERCUTANEOUS ENDOSCOPIC GASTROSTOMY (PEG) PLACEMENT;  Surgeon: Georganna Skeans,  MD;  Location: [facility];  Service: General;  Laterality: N/A;    FAMILY HISTORY: Family History  Problem Relation Age of Onset  . Hypertension Father     SOCIAL HISTORY: Social History   Socioeconomic History  . Marital status: Unknown    Spouse name: Not on file  . Number of children: Not on file  . Years of education: Not on file  . Highest education level: Not on file  Occupational History  . Not on file  Tobacco Use  . Smoking status: Former Smoker    Types: Cigarettes  . Smokeless tobacco: Never Used  . Tobacco comment: UTA  Vaping Use  . Vaping Use: Never used  Substance and Sexual Activity  . Alcohol use: No    Comment: UTA  . Drug use: No    Comment: UTA  . Sexual activity: Not Currently    Birth control/protection: None  Other Topics Concern  . Not on file  Social History Narrative  . Not on file   Social Determinants of Health   Financial Resource Strain:   . Difficulty of Paying Living Expenses: Not on file  Food Insecurity:   . Worried About Charity fundraiser in the Last Year: Not on file  . Ran Out of Food in the  Last Year: Not on file  Transportation Needs:   . Lack of Transportation (Medical): Not on file  . Lack of Transportation (Non-Medical): Not on file  Physical Activity:   . Days of Exercise per Week: Not on file  . Minutes of Exercise per Session: Not on file  Stress:   . Feeling of Stress : Not on file  Social Connections:   . Frequency of Communication with Friends and Family: Not on file  . Frequency of Social Gatherings with Friends and Family: Not on file  . Attends Religious Services: Not on file  . Active Member of Clubs or Organizations: Not on file  . Attends Archivist Meetings: Not on file  . Marital Status: Not on file  Intimate Partner Violence:   . Fear of Current or Ex-Partner: Not on file  . Emotionally Abused: Not on file  . Physically Abused: Not on file  . Sexually Abused: Not on file     PHYSICAL EXAM  Vitals:   04/02/20 1346  BP: 124/84  Pulse: 95   There is no height or weight on file to calculate BMI.  Generalized: Well developed, pleasant middle-aged African-American male, in no acute distress  Head: normocephalic and atraumatic,. Oropharynx benign  Neck: Supple, no carotid bruits  Cardiac: Regular rate rhythm, no murmur  Musculoskeletal: No deformity   Neurological examination  Mentation: Alert oriented to time, place, history taking. Attention span and concentration appropriate. Recent and remote memory intact.  Follows all commands, mild dysarthria.  Cranial nerve II-XII: Pupils were equal round reactive to light extraocular movements were full, visual field with dense left homonymous hemianopia on confrontational test.  Mild left lower facial weakness.  Hearing was intact to finger rubbing bilaterally. Uvula tongue midline. head turning and shoulder shrug were normal and symmetric.Tongue protrusion into cheek strength was normal. Motor: Left spastic hemiplegia.  Normal strength and tone right upper and lower extremity Sensory: normal  and symmetric to light touch, pinprick, and  Vibration in the upper and lower extremities on the right diminished on the left Coordination: finger-nose-finger, heel-to-shin performed accurately on the right unable on the left  Reflexes: 2+ on the right brisker on  the left plantar responses were downgoing on the right and upgoing on the left  Gait and Station: [employer]   DIAGNOSTIC DATA (LABS, IMAGING, TESTING) - I reviewed patient records, labs, notes, testing and imaging myself where available.  Lab Results  Component Value Date   WBC 13.4 (H) 03/29/2020   HGB 14.7 03/29/2020   HCT 49.1 03/29/2020   MCV 93.3 03/29/2020   PLT 172 03/29/2020      Component Value Date/Time   NA 137 03/29/2020 0657   K 3.9 03/29/2020 0657   CL 107 03/29/2020 0657   CO2 22 03/29/2020 0657   GLUCOSE 113 (H) 03/29/2020 0657   BUN 15 03/29/2020 0657   CREATININE 0.69 03/29/2020 0657   CALCIUM 9.3 03/29/2020 0657   CALCIUM 11.5 (H) 09/05/2017 0709   PROT 6.6 03/13/2020 0427   ALBUMIN 3.1 (L) 03/13/2020 0427   AST 13 (L) 03/13/2020 0427   ALT 26 03/13/2020 0427   ALKPHOS 58 03/13/2020 0427   BILITOT 0.5 03/13/2020 0427   GFRNONAA >60 03/29/2020 0657   GFRAA >60 03/29/2020 0657   Lab Results  Component Value Date   CHOL 143 03/29/2020   HDL 30 (L) 03/29/2020   LDLCALC 96 03/29/2020   TRIG 83 03/29/2020   CHOLHDL 4.8 03/29/2020   Lab Results  Component Value Date   HGBA1C 4.9 03/28/2020   Lab Results  Component Value Date   IRJJOACZ66 063 04/04/2017   Lab Results  Component Value Date   TSH 0.811 03/28/2020      ASSESSMENT AND PLAN 42 year African-American male with 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  New onset seizure activity on 03/28/2020    Seizures -Likely late effect from  prior stroke -Continue Depakote 750 mg 3 times daily managed by facility -Obtain Depakote level on 8/30 at facility -Order placed to obtain EEG  R MCA stroke -Residual left spastic hemiplegia and left hemianopia stable -Continue Eliquis and atorvastatin for secondary stroke prevention history of A. Fib -Continue close PCP follow-up for aggressive stroke risk factor management  S/p hemicraniectomy -Followed by [facility] neurosurgery -Apparently unable to replace skullcap per patient -Scalp Has been removed from the abdomen   Follow-up in 6 months or call earlier if needed  I spent 30 minutes of face-to-face and non-face-to-face time with patient.  This included previsit chart review, lab review, study review, order entry, electronic health record documentation, patient education regarding recent seizure activity with ongoing use of Depakote and explanation EEG for seizure, hx of prior stroke and residual deficits, importance of managing stroke risk factors and answered all questions to patient satisfaction   Frann Rider, AGNP-BC  [facility] [address] [place], [place] [address]  Phone [phone] Fax [phone] Note: This document was prepared with digital dictation and possible smart phrase technology. Any transcriptional errors that result from this process are unintentional.

## 2020-04-06 ENCOUNTER — Encounter (HOSPITAL_COMMUNITY)
Admission: RE | Admit: 2020-04-06 | Discharge: 2020-04-06 | Disposition: A | Discharge status: Home / Self Care | Payer: Medicaid Other | Source: Skilled Nursing Facility | Attending: Adult Health | Admitting: Adult Health

## 2020-04-06 ENCOUNTER — Non-Acute Institutional Stay (SKILLED_NURSING_FACILITY): Payer: Medicaid Other | Admitting: Adult Health

## 2020-04-06 ENCOUNTER — Encounter: Payer: Self-pay | Admitting: Adult Health

## 2020-04-06 DIAGNOSIS — E1165 Type 2 diabetes mellitus with hyperglycemia: Secondary | ICD-10-CM | POA: Diagnosis present

## 2020-04-06 DIAGNOSIS — I480 Paroxysmal atrial fibrillation: Secondary | ICD-10-CM | POA: Diagnosis not present

## 2020-04-06 DIAGNOSIS — E785 Hyperlipidemia, unspecified: Secondary | ICD-10-CM

## 2020-04-06 DIAGNOSIS — E1169 Type 2 diabetes mellitus with other specified complication: Secondary | ICD-10-CM | POA: Diagnosis not present

## 2020-04-06 DIAGNOSIS — R569 Unspecified convulsions: Secondary | ICD-10-CM

## 2020-04-06 LAB — CBC WITH DIFFERENTIAL/PLATELET
Abs Immature Granulocytes: 0.05 10*3/uL (ref 0.00–0.07)
Basophils Absolute: 0.1 10*3/uL (ref 0.0–0.1)
Basophils Relative: 1 %
Eosinophils Absolute: 0.5 10*3/uL (ref 0.0–0.5)
Eosinophils Relative: 5 %
HCT: 42.2 % (ref 39.0–52.0)
Hemoglobin: 12.7 g/dL — ABNORMAL LOW (ref 13.0–17.0)
Lymphocytes Relative: 33 %
Lymphs Abs: 3.6 10*3/uL (ref 0.7–4.0)
MCH: 28.7 pg (ref 26.0–34.0)
MCHC: 30.1 g/dL (ref 30.0–36.0)
MCV: 95.5 fL (ref 80.0–100.0)
Monocytes Absolute: 0.5 10*3/uL (ref 0.1–1.0)
Monocytes Relative: 5 %
Neutro Abs: 6.1 10*3/uL (ref 1.7–7.7)
Neutrophils Relative %: 56 %
Platelets: 171 10*3/uL (ref 150–400)
RBC: 4.42 MIL/uL (ref 4.22–5.81)
RDW: 14.3 % (ref 11.5–15.5)
WBC: 10.9 10*3/uL — ABNORMAL HIGH (ref 4.0–10.5)
nRBC: 0 % (ref 0.0–0.2)

## 2020-04-06 LAB — COMPREHENSIVE METABOLIC PANEL
ALT: 18 U/L (ref 0–44)
AST: 14 U/L — ABNORMAL LOW (ref 15–41)
Albumin: 2.9 g/dL — ABNORMAL LOW (ref 3.5–5.0)
Alkaline Phosphatase: 53 U/L (ref 38–126)
Anion gap: 8 (ref 5–15)
BUN: 11 mg/dL (ref 6–20)
CO2: 23 mmol/L (ref 22–32)
Calcium: 9 mg/dL (ref 8.9–10.3)
Chloride: 109 mmol/L (ref 98–111)
Creatinine, Ser: 0.76 mg/dL (ref 0.61–1.24)
GFR calc Af Amer: >60 mL/min (ref >60)
GFR calc non Af Amer: >60 mL/min (ref >60)
Glucose, Bld: 91 mg/dL (ref 70–99)
Potassium: 4.7 mmol/L (ref 3.5–5.1)
Sodium: 140 mmol/L (ref 135–145)
Total Bilirubin: 0.5 mg/dL (ref 0.3–1.2)
Total Protein: 6.4 g/dL — ABNORMAL LOW (ref 6.5–8.1)

## 2020-04-06 LAB — VALPROIC ACID LEVEL: Valproic Acid Lvl: 34 ug/mL — ABNORMAL LOW (ref 50.0–100.0)

## 2020-04-06 NOTE — Progress Notes (Signed)
Location:  [facility] Room Number: 117-D Place of Service:  SNF (31)   CODE STATUS: FULL CODE  Allergies  Allergen Reactions  . Cheese   . Penicillins Hives    Has patient had a PCN reaction causing immediate rash, facial/tongue/throat swelling, SOB or lightheadedness with hypotension: Unknown Has patient had a PCN reaction causing severe rash involving mucus membranes or skin necrosis: Unknown Has patient had a PCN reaction that required hospitalization: No Has patient had a PCN reaction occurring within the last 10 years: No If all of the above answers are "NO", then may proceed with Cephalosporin use.     Chief Complaint  Patient presents with  . Acute Visit           Seizures   Dyslipidemia associated with type 2 diabetes mellitus   AF (paroxysmal atrial fibrillation)   Weekly follow up for the first 30 days post hospitalization.     HPI:  He is a 59 year old long term resident of this facility being seen for the management of his chronic illnesses; Seizures   Dyslipidemia associated with type 2 diabetes mellitus   AF (paroxysmal atrial fibrillation). There are no reports of pain present. No reports of heart burn or constipation. He is declining some doses of his depakote. He has been encouraged to take his medications as prescribed.   Past Medical History:  Diagnosis Date  . DM (diabetes mellitus) ([place])   . Hypertension   . Stroke [facility])     Past Surgical History:  Procedure Laterality Date  . CRANIECTOMY Right 04/01/2017   Procedure: RIGHT DECOMPRESSIVE CRANIECTOMY;  Surgeon: Ditty, Kevan Ny, MD;  Location: [facility];  Service: Neurosurgery;  Laterality: Right;  . ESOPHAGOGASTRODUODENOSCOPY N/A 04/13/2017   Procedure: ESOPHAGOGASTRODUODENOSCOPY (EGD);  Surgeon: Georganna Skeans, MD;  Location: [place];  Service: General;  Laterality: N/A;  bedside  . INCISION AND DRAINAGE PERIRECTAL ABSCESS N/A 09/28/2017   Procedure: IRRIGATION AND  DEBRIDEMENT PERIANAL ABSCESS;  Surgeon: Virl Cagey, MD;  Location: [facility];  Service: General;  Laterality: N/A;  . PEG PLACEMENT N/A 04/13/2017   Procedure: PERCUTANEOUS ENDOSCOPIC GASTROSTOMY (PEG) PLACEMENT;  Surgeon: Georganna Skeans, MD;  Location: [facility] ENDOSCOPY;  Service: General;  Laterality: N/A;    Social History   Socioeconomic History  . Marital status: Unknown    Spouse name: Not on file  . Number of children: Not on file  . Years of education: Not on file  . Highest education level: Not on file  Occupational History  . Not on file  Tobacco Use  . Smoking status: Former Smoker    Types: Cigarettes  . Smokeless tobacco: Never Used  . Tobacco comment: UTA  Vaping Use  . Vaping Use: Never used  Substance and Sexual Activity  . Alcohol use: No    Comment: UTA  . Drug use: No    Comment: UTA  . Sexual activity: Not Currently    Birth control/protection: None  Other Topics Concern  . Not on file  Social History Narrative  . Not on file   Social Determinants of Health   Financial Resource Strain:   . Difficulty of Paying Living Expenses: Not on file  Food Insecurity:   . Worried About Charity fundraiser in the Last Year: Not on file  . Ran Out of Food in the Last Year: Not on file  Transportation Needs:   . Lack of Transportation (Medical): Not on file  . Lack of  Transportation (Non-Medical): Not on file  Physical Activity:   . Days of Exercise per Week: Not on file  . Minutes of Exercise per Session: Not on file  Stress:   . Feeling of Stress : Not on file  Social Connections:   . Frequency of Communication with Friends and Family: Not on file  . Frequency of Social Gatherings with Friends and Family: Not on file  . Attends Religious Services: Not on file  . Active Member of Clubs or Organizations: Not on file  . Attends Archivist Meetings: Not on file  . Marital Status: Not on file  Intimate Partner Violence:   . Fear of Current or  Ex-Partner: Not on file  . Emotionally Abused: Not on file  . Physically Abused: Not on file  . Sexually Abused: Not on file   Family History  Problem Relation Age of Onset  . Hypertension Father       VITAL SIGNS BP 117/71   Pulse 67   Temp 97.9 F (36.6 C) (Oral)   Resp 20   Ht 5\' 6"  (1.676 m)   Wt 178 lb 12.8 oz (81.1 kg)   SpO2 95%   BMI 28.86 kg/m   Outpatient Encounter Medications as of 04/06/2020  Medication Sig  . acetaminophen (TYLENOL) 325 MG tablet Take 650 mg by mouth every 4 (four) hours as needed. Not to exceed 3 grams in 24 hours  . Amino Acids-Protein Hydrolys (FEEDING SUPPLEMENT, PRO-STAT 64,) LIQD Take 30 mLs by mouth daily.   Marland Kitchen apixaban (ELIQUIS) 5 MG TABS tablet Take 5 mg by mouth 2 (two) times daily.   Marland Kitchen atorvastatin (LIPITOR) 40 MG tablet Take 40 mg by mouth daily.  . Baclofen 5 MG TABS Take 5 mg by mouth 3 (three) times daily.  . bisacodyl (DULCOLAX) 10 MG suppository Place 1 suppository (10 mg total) rectally daily as needed for moderate constipation.  . Cholecalciferol 1.25 MG (50000 UT) capsule Take 1 capsule (50,000 Units total) by mouth once a week. Once a day on mon  . coal tar (NEUTROGENA T-GEL) 0.5 % shampoo Apply 1 application topically See admin instructions. Apply to scalp on shower days twice a week Wed And Sat prn  . Dextromethorphan-guaiFENesin (ROBITUSSIN COUGH+CHEST CONG DM PO) Take 10 mLs by mouth every 6 (six) hours as needed.  . divalproex (DEPAKOTE SPRINKLE) 125 MG capsule Take 1,000 mg by mouth every 8 (eight) hours. Special Instructions: for new onset seizures   . gabapentin (NEURONTIN) 100 MG capsule Take 200 mg by mouth at bedtime.   Marland Kitchen LORazepam (ATIVAN) 2 MG/ML injection Inject 0.5 mLs (1 mg total) into the muscle every 15 (fifteen) minutes as needed. Up to 3 doses for uncontrolled seizure activity  . Menthol, Topical Analgesic, (BIOFREEZE) 4 % GEL Apply 1 application topically 2 (two) times daily. Apply to left shoulder   .  metoprolol tartrate (LOPRESSOR) 25 MG tablet Take 12.5 mg by mouth 2 (two) times daily.  . NON FORMULARY Regular Diet  . omeprazole (PRILOSEC) 40 MG capsule Take 40 mg by mouth daily.   Marland Kitchen senna (SENOKOT) 8.6 MG TABS tablet Take 2 tablets by mouth 2 (two) times daily.    No facility-administered encounter medications on file as of 04/06/2020.     SIGNIFICANT DIAGNOSTIC EXAMS   PREVIOUS:   08-24-18: ct of head: 1. 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 Increased volume loss of the infarcted brain as well as decreased herniation of brain and  dura through the craniectomy defect. 2. No new intracranial abnormality.  NO NEW EXAMS.   LABS REVIEWED PREVIOUS:   05-20-19: wbc 9.1; hgb 12.6; hct 43.6; mcv 87.6 plt 196; chol 151; ldl 84; trig 185; hdl 30 vit D 24.00 PSA 1.60 07-29-19: : glucose 97; bun 11; creat 0.85; k+ 4.1; na++ 140; ca 9.2 liver normal albumin 3.2 hgb a1c 4.6 08-21-19: vit D 24.16  11-07-19: vit D 46.75 12-05-19: wbc 10.5; hgb 14.2; hct 47.5; mcv 94.1 plt 158; glucose 84; bun 11; creat 0.80 k+ 4.1; na++ 140; ca 9.2 liver normal albumin 3.1 hgb a1c 4.9 chol 132; ldl 77; trig 110; hdl 33  01-09-20: vit D 43.63 02-06-20: urine micro-albumin: 27.4  03-13-20: glucose 116; bun 12; creat 0.67; k+ 3.8; na++ 136; ca 8.8 liver normal albumin 3.1 vit D 29.72 03-28-20: wbc 15.5; hgb 15.6; hct 51.7; mcv 93.5 plt clump; glucose 153; bun 16; creat 0.99; k+ 4.4; na++ 137; ca 9.6 tsh 0.811; hgb a1c 4.9;  03-29-20: chol 143 ldl 96 trig 83 hdl 30 HIV nr   TODAY  04-06-20: wbc 10.9; hgb 12.7; hct 42.2; mcv 95.5 plt 171; glucose 91; bun 11; creat 0.76; k+ 4.7; na++ 140; ca 9.0 liver normal albumin 2.9 depakote 34    Review of Systems  Constitutional: Negative for malaise/fatigue.  Respiratory: Negative for cough and shortness of breath.   Cardiovascular: Negative for chest pain, palpitations and leg swelling.  Gastrointestinal: Negative for abdominal pain,  constipation and heartburn.  Musculoskeletal: Negative for back pain, joint pain and myalgias.  Skin: Negative.   Neurological: Negative for dizziness.  Psychiatric/Behavioral: The patient is not nervous/anxious.      Physical Exam Constitutional:      General: He is not in acute distress.    Appearance: He is well-developed. He is not diaphoretic.  Neck:     Thyroid: No thyromegaly.  Cardiovascular:     Rate and Rhythm: Normal rate and regular rhythm.     Pulses: Normal pulses.     Heart sounds: Normal heart sounds.  Pulmonary:     Effort: Pulmonary effort is normal. No respiratory distress.     Breath sounds: Normal breath sounds.  Abdominal:     General: Bowel sounds are normal. There is no distension.     Palpations: Abdomen is soft.     Tenderness: There is no abdominal tenderness.  Musculoskeletal:     Cervical back: Neck supple.     Right lower leg: No edema.     Left lower leg: No edema.     Comments: Left hemiplegia with stiffness present       Lymphadenopathy:     Cervical: No cervical adenopathy.  Skin:    General: Skin is warm and dry.  Neurological:     Mental Status: He is alert. Mental status is at baseline.  Psychiatric:        Mood and Affect: Mood normal.          ASSESSMENT/ PLAN:  TODAY;   1. Seizures is stable no reports of seizure activity. Will increase depakote sprinkles to 1 gm every 8 hours will monitor   2. Dyslipidemia associated with type 2 diabetes mellitus  LDL 96 will increase to lipitor 80 mg daily    3. AF (paroxysmal atrial fibrillation) heart rate is stable will continue lopressor 12.5 mg twice daily for rate control eliquis 5 mg twice daily   PREVIOUS   4. Hypertension associated with type 2 diabetes mellitus: is  stable b/p 117/71 will continue lopressor 12.5 mg twice daily   5. Type 2 diabetes mellitus with neurological complications: is stable hgb a1c 4.9 diet controlled  6. Diabetic peripheral neuropathy: is stable will  continue gabapentin 200 mg nightly   7. Chronic constipation is stable will continue senna 2 tabs twice daily   8. Cerebrovascular accident (CVA) due to embolism of right middle cerebral artery: s/p compression right frontotemporoparietal craniotomy with duroplasty 2018: is stable will continue eliquis 5 mg twice daily   9. Vit D deficiency level 29.72 will continue 50,000 units weekly   10. 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 GERD without esophagitis: is stable will continue prilosec 40 mg daily       MD is aware of resident's narcotic use and is in agreement with current plan of care. We will attempt to wean resident as appropriate.  Ok Edwards NP [facility]  Contact [phone] Monday through Friday 8am- 5pm  After hours call [phone]

## 2020-04-07 NOTE — Progress Notes (Signed)
I agree with the above plan 

## 2020-04-09 ENCOUNTER — Encounter: Payer: Self-pay | Admitting: Adult Health

## 2020-04-09 ENCOUNTER — Non-Acute Institutional Stay (SKILLED_NURSING_FACILITY): Payer: Medicaid Other | Admitting: Adult Health

## 2020-04-09 DIAGNOSIS — R569 Unspecified convulsions: Secondary | ICD-10-CM

## 2020-04-09 NOTE — Progress Notes (Signed)
Location:    [facility] Room Number: 117/D Place of Service:  SNF (31)   CODE STATUS: Full Code  Allergies  Allergen Reactions  . Cheese   . Penicillins Hives    Has patient had a PCN reaction causing immediate rash, facial/tongue/throat swelling, SOB or lightheadedness with hypotension: Unknown Has patient had a PCN reaction causing severe rash involving mucus membranes or skin necrosis: Unknown Has patient had a PCN reaction that required hospitalization: No Has patient had a PCN reaction occurring within the last 10 years: No If all of the above answers are "NO", then may proceed with Cephalosporin use.     Chief Complaint  Patient presents with  . Acute Visit    Medication Concerns    HPI:  Staff reports that he is spitting out medications. He tells me that he does not want anyone to look at him while he swallows his pills. We have had an extensive discussion about his medications their importance. He did verbalize understanding he did agree to take his medications as prescribed. He has declined several doses over the past 2 weeks. He has been recently started on depakote for seizure control.   Past Medical History:  Diagnosis Date  . DM (diabetes mellitus) ([place])   . Hypertension   . Stroke [facility])     Past Surgical History:  Procedure Laterality Date  . CRANIECTOMY Right 04/01/2017   Procedure: RIGHT DECOMPRESSIVE CRANIECTOMY;  Surgeon: Ditty, Kevan Ny, MD;  Location: [place];  Service: Neurosurgery;  Laterality: Right;  . ESOPHAGOGASTRODUODENOSCOPY N/A 04/13/2017   Procedure: ESOPHAGOGASTRODUODENOSCOPY (EGD);  Surgeon: Georganna Skeans, MD;  Location: [place];  Service: General;  Laterality: N/A;  bedside  . INCISION AND DRAINAGE PERIRECTAL ABSCESS N/A 09/28/2017   Procedure: IRRIGATION AND DEBRIDEMENT PERIANAL ABSCESS;  Surgeon: Virl Cagey, MD;  Location: [facility];  Service: General;  Laterality: N/A;  . PEG PLACEMENT N/A 04/13/2017    Procedure: PERCUTANEOUS ENDOSCOPIC GASTROSTOMY (PEG) PLACEMENT;  Surgeon: Georganna Skeans, MD;  [facility]: [facility] ENDOSCOPY;  Service: General;  Laterality: N/A;    Social History   Socioeconomic History  . Marital status: Unknown    Spouse name: Not on file  . Number of children: Not on file  . Years of education: Not on file  . Highest education level: Not on file  Occupational History  . Not on file  Tobacco Use  . Smoking status: Former Smoker    Types: Cigarettes  . Smokeless tobacco: Never Used  . Tobacco comment: UTA  Vaping Use  . Vaping Use: Never used  Substance and Sexual Activity  . Alcohol use: No    Comment: UTA  . Drug use: No    Comment: UTA  . Sexual activity: Not Currently    Birth control/protection: None  Other Topics Concern  . Not on file  Social History Narrative  . Not on file   Social Determinants of Health   Financial Resource Strain:   . Difficulty of Paying Living Expenses: Not on file  Food Insecurity:   . Worried About Charity fundraiser in the Last Year: Not on file  . Ran Out of Food in the Last Year: Not on file  Transportation Needs:   . Lack of Transportation (Medical): Not on file  . Lack of Transportation (Non-Medical): Not on file  Physical Activity:   . Days of Exercise per Week: Not on file  . Minutes of Exercise per Session: Not on file  Stress:   .  Feeling of Stress : Not on file  Social Connections:   . Frequency of Communication with Friends and Family: Not on file  . Frequency of Social Gatherings with Friends and Family: Not on file  . Attends Religious Services: Not on file  . Active Member of Clubs or Organizations: Not on file  . Attends Archivist Meetings: Not on file  . Marital Status: Not on file  Intimate Partner Violence:   . Fear of Current or Ex-Partner: Not on file  . Emotionally Abused: Not on file  . Physically Abused: Not on file  . Sexually Abused: Not on file   Family History  Problem  Relation Age of Onset  . Hypertension Father       VITAL SIGNS BP 114/68   Pulse 75   Temp 98.1 F (36.7 C) (Oral)   Resp 20   Ht 5\' 6"  (1.676 m)   Wt 178 lb 12.8 oz (81.1 kg)   BMI 28.86 kg/m   Outpatient Encounter Medications as of 04/09/2020  Medication Sig  . acetaminophen (TYLENOL) 325 MG tablet Take 650 mg by mouth every 4 (four) hours as needed. Not to exceed 3 grams in 24 hours  . Amino Acids-Protein Hydrolys (FEEDING SUPPLEMENT, PRO-STAT 64,) LIQD Take 30 mLs by mouth daily.   Marland Kitchen apixaban (ELIQUIS) 5 MG TABS tablet Take 5 mg by mouth 2 (two) times daily.   Marland Kitchen atorvastatin (LIPITOR) 40 MG tablet Take 80 mg by mouth daily. For Dyslipidemia  . Baclofen 5 MG TABS Take 5 mg by mouth 3 (three) times daily.  . bisacodyl (DULCOLAX) 10 MG suppository Place 1 suppository (10 mg total) rectally daily as needed for moderate constipation.  . Cholecalciferol 1.25 MG (50000 UT) capsule Take 1 capsule (50,000 Units total) by mouth once a week. Once a day on mon  . coal tar (NEUTROGENA T-GEL) 0.5 % shampoo Apply 1 application topically See admin instructions. Apply to scalp on shower days twice a week Wed And Sat prn  . Dextromethorphan-guaiFENesin (ROBITUSSIN COUGH+CHEST CONG DM PO) Take 10 mLs by mouth every 6 (six) hours as needed.  . Divalproex Sodium (DEPAKOTE ER PO) Take 3,500 mg by mouth every evening.  . gabapentin (NEURONTIN) 100 MG capsule Take 200 mg by mouth at bedtime.   Marland Kitchen LORazepam (ATIVAN) 2 MG/ML injection Inject 0.5 mLs (1 mg total) into the muscle every 15 (fifteen) minutes as needed. Up to 3 doses for uncontrolled seizure activity  . Menthol, Topical Analgesic, (BIOFREEZE) 4 % GEL Apply 1 application topically 2 (two) times daily. Apply to left shoulder   . metoprolol tartrate (LOPRESSOR) 25 MG tablet Take 12.5 mg by mouth 2 (two) times daily.  . NON FORMULARY Regular Diet  . omeprazole (PRILOSEC) 40 MG capsule Take 40 mg by mouth daily.   Marland Kitchen senna (SENOKOT) 8.6 MG TABS  tablet Take 2 tablets by mouth 2 (two) times daily.   . [DISCONTINUED] divalproex (DEPAKOTE SPRINKLE) 125 MG capsule Take 3,500 mg by mouth every evening. Special Instructions: for new onset seizures    No facility-administered encounter medications on file as of 04/09/2020.     SIGNIFICANT DIAGNOSTIC EXAMS   PREVIOUS:   08-24-18: ct of head: 1. 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 Increased volume loss of the infarcted brain as well as decreased herniation of brain and dura through the craniectomy defect. 2. No new intracranial abnormality.  NO NEW EXAMS.   LABS REVIEWED PREVIOUS:   05-20-19: wbc  9.1; hgb 12.6; hct 43.6; mcv 87.6 plt 196; chol 151; ldl 84; trig 185; hdl 30 vit D 24.00 PSA 1.60 07-29-19: : glucose 97; bun 11; creat 0.85; k+ 4.1; na++ 140; ca 9.2 liver normal albumin 3.2 hgb a1c 4.6 08-21-19: vit D 24.16  11-07-19: vit D 46.75 12-05-19: wbc 10.5; hgb 14.2; hct 47.5; mcv 94.1 plt 158; glucose 84; bun 11; creat 0.80 k+ 4.1; na++ 140; ca 9.2 liver normal albumin 3.1 hgb a1c 4.9 chol 132; ldl 77; trig 110; hdl 33  01-09-20: vit D 43.63 02-06-20: urine micro-albumin: 27.4  03-13-20: glucose 116; bun 12; creat 0.67; k+ 3.8; na++ 136; ca 8.8 liver normal albumin 3.1 vit D 29.72 03-28-20: wbc 15.5; hgb 15.6; hct 51.7; mcv 93.5 plt clump; glucose 153; bun 16; creat 0.99; k+ 4.4; na++ 137; ca 9.6 tsh 0.811; hgb a1c 4.9;  03-29-20: chol 143 ldl 96 trig 83 hdl 30 HIV nr  04-06-20: wbc 10.9; hgb 12.7; hct 42.2; mcv 95.5 plt 171; glucose 91; bun 11; creat 0.76; k+ 4.7; na++ 140; ca 9.0 liver normal albumin 2.9 depakote 34   NO NEW LABS.    Review of Systems  Constitutional: Negative for malaise/fatigue.  Respiratory: Negative for cough and shortness of breath.   Cardiovascular: Negative for chest pain, palpitations and leg swelling.  Gastrointestinal: Negative for abdominal pain, constipation and heartburn.  Musculoskeletal: Negative for back pain,  joint pain and myalgias.  Skin: Negative.   Neurological: Negative for dizziness.  Psychiatric/Behavioral: The patient is not nervous/anxious.     Physical Exam Constitutional:      General: He is not in acute distress.    Appearance: He is well-developed. He is not diaphoretic.  Neck:     Thyroid: No thyromegaly.  Cardiovascular:     Rate and Rhythm: Normal rate and regular rhythm.     Pulses: Normal pulses.     Heart sounds: Normal heart sounds.  Pulmonary:     Effort: Pulmonary effort is normal. No respiratory distress.     Breath sounds: Normal breath sounds.  Abdominal:     General: Bowel sounds are normal. There is no distension.     Palpations: Abdomen is soft.     Tenderness: There is no abdominal tenderness.  Musculoskeletal:     Cervical back: Neck supple.     Right lower leg: No edema.     Left lower leg: No edema.     Comments: Left hemiplegia with stiffness present        Lymphadenopathy:     Cervical: No cervical adenopathy.  Skin:    General: Skin is warm and dry.  Neurological:     Mental Status: He is alert. Mental status is at baseline.  Psychiatric:        Mood and Affect: Mood normal.      ASSESSMENT/ PLAN:  TODAY;   1. Seizures: is stable to help improve his ability to take his depakote as ordered will change to depakote ER 3500 mg every evening and will monitor his status. Will check depakote level on 04-13-20.      MD is aware of resident's narcotic use and is in agreement with current plan of care. We will attempt to wean resident as appropriate.  Ok Edwards NP [facility]  Contact [phone] Monday through Friday 8am- 5pm  After hours call [phone]

## 2020-04-14 ENCOUNTER — Encounter: Payer: Self-pay | Admitting: Adult Health

## 2020-04-14 ENCOUNTER — Non-Acute Institutional Stay (SKILLED_NURSING_FACILITY): Payer: Medicaid Other | Admitting: Adult Health

## 2020-04-14 DIAGNOSIS — I10 Essential (primary) hypertension: Secondary | ICD-10-CM | POA: Diagnosis not present

## 2020-04-14 DIAGNOSIS — E1149 Type 2 diabetes mellitus with other diabetic neurological complication: Secondary | ICD-10-CM | POA: Diagnosis not present

## 2020-04-14 DIAGNOSIS — E1142 Type 2 diabetes mellitus with diabetic polyneuropathy: Secondary | ICD-10-CM

## 2020-04-14 DIAGNOSIS — E1159 Type 2 diabetes mellitus with other circulatory complications: Secondary | ICD-10-CM

## 2020-04-14 DIAGNOSIS — I152 Hypertension secondary to endocrine disorders: Secondary | ICD-10-CM

## 2020-04-14 NOTE — Progress Notes (Signed)
Location:    [place] Room Number: 117/D Place of Service:  SNF (31)   CODE STATUS: Full Code  Allergies  Allergen Reactions  . Cheese   . Penicillins Hives    Has patient had a PCN reaction causing immediate rash, facial/tongue/throat swelling, SOB or lightheadedness with hypotension: Unknown Has patient had a PCN reaction causing severe rash involving mucus membranes or skin necrosis: Unknown Has patient had a PCN reaction that required hospitalization: No Has patient had a PCN reaction occurring within the last 10 years: No If all of the above answers are "NO", then may proceed with Cephalosporin use.     Chief Complaint  Patient presents with  . Short Term Rehab (STR)           Hypertension associated with type 2 diabetes mellitus . Type 2 diabetes mellitus with neurological complications:    Diabetic peripheral neuropathy:   Weekly follow up for the first 30 days post hospitalization.     HPI:  He is a 59 year old long term resident of this facility being seen for the management of his chronic illnesses:  Hypertension associated with type 2 diabetes mellitus . Type 2 diabetes mellitus with neurological complications:    Diabetic peripheral neuropathy:. There are no reports of uncontrolled pain; no anxiety or agitation no reports of insomnia. There are no reports of him declining medications.   Past Medical History:  Diagnosis Date  . DM (diabetes mellitus) ([place])   . Hypertension   . Stroke [facility])     Past Surgical History:  Procedure Laterality Date  . CRANIECTOMY Right 04/01/2017   Procedure: RIGHT DECOMPRESSIVE CRANIECTOMY;  Surgeon: Ditty, Kevan Ny, MD;  Location: [place];  Service: Neurosurgery;  Laterality: Right;  . ESOPHAGOGASTRODUODENOSCOPY N/A 04/13/2017   Procedure: ESOPHAGOGASTRODUODENOSCOPY (EGD);  Surgeon: Georganna Skeans, MD;  Location: [place];  Service: General;  Laterality: N/A;  bedside  . INCISION AND DRAINAGE PERIRECTAL  ABSCESS N/A 09/28/2017   Procedure: IRRIGATION AND DEBRIDEMENT PERIANAL ABSCESS;  Surgeon: Virl Cagey, MD;  Location: [facility];  Service: General;  Laterality: N/A;  . PEG PLACEMENT N/A 04/13/2017   Procedure: PERCUTANEOUS ENDOSCOPIC GASTROSTOMY (PEG) PLACEMENT;  Surgeon: Georganna Skeans, MD;  Location: [facility] ENDOSCOPY;  Service: General;  Laterality: N/A;    Social History   Socioeconomic History  . Marital status: Unknown    Spouse name: Not on file  . Number of children: Not on file  . Years of education: Not on file  . Highest education level: Not on file  Occupational History  . Not on file  Tobacco Use  . Smoking status: Former Smoker    Types: Cigarettes  . Smokeless tobacco: Never Used  . Tobacco comment: UTA  Vaping Use  . Vaping Use: Never used  Substance and Sexual Activity  . Alcohol use: No    Comment: UTA  . Drug use: No    Comment: UTA  . Sexual activity: Not Currently    Birth control/protection: None  Other Topics Concern  . Not on file  Social History Narrative  . Not on file   Social Determinants of Health   Financial Resource Strain:   . Difficulty of Paying Living Expenses: Not on file  Food Insecurity:   . Worried About Charity fundraiser in the Last Year: Not on file  . Ran Out of Food in the Last Year: Not on file  Transportation Needs:   . Lack of Transportation (Medical): Not  on file  . Lack of Transportation (Non-Medical): Not on file  Physical Activity:   . Days of Exercise per Week: Not on file  . Minutes of Exercise per Session: Not on file  Stress:   . Feeling of Stress : Not on file  Social Connections:   . Frequency of Communication with Friends and Family: Not on file  . Frequency of Social Gatherings with Friends and Family: Not on file  . Attends Religious Services: Not on file  . Active Member of Clubs or Organizations: Not on file  . Attends Archivist Meetings: Not on file  . Marital Status: Not on file    Intimate Partner Violence:   . Fear of Current or Ex-Partner: Not on file  . Emotionally Abused: Not on file  . Physically Abused: Not on file  . Sexually Abused: Not on file   Family History  Problem Relation Age of Onset  . Hypertension Father       VITAL SIGNS BP 121/74   Pulse 90   Temp 97.7 F (36.5 C) (Oral)   Resp 20   Ht 5\' 6"  (1.676 m)   Wt 180 lb (81.6 kg)   BMI 29.05 kg/m   Outpatient Encounter Medications as of 04/14/2020  Medication Sig  . acetaminophen (TYLENOL) 325 MG tablet Take 650 mg by mouth every 4 (four) hours as needed. Not to exceed 3 grams in 24 hours  . Amino Acids-Protein Hydrolys (FEEDING SUPPLEMENT, PRO-STAT 64,) LIQD Take 30 mLs by mouth daily.   Marland Kitchen apixaban (ELIQUIS) 5 MG TABS tablet Take 5 mg by mouth 2 (two) times daily.   Marland Kitchen atorvastatin (LIPITOR) 40 MG tablet Take 80 mg by mouth daily. For Dyslipidemia  . Baclofen 5 MG TABS Take 5 mg by mouth 3 (three) times daily.  . bisacodyl (DULCOLAX) 10 MG suppository Place 1 suppository (10 mg total) rectally daily as needed for moderate constipation.  . Cholecalciferol 1.25 MG (50000 UT) capsule Take 50,000 Units by mouth 2 (two) times a week. For Low Vitamin D Level  . coal tar (NEUTROGENA T-GEL) 0.5 % shampoo Apply 1 application topically See admin instructions. Apply to scalp on shower days twice a week Wed And Sat prn  . Dextromethorphan-guaiFENesin (ROBITUSSIN COUGH+CHEST CONG DM PO) Take 10 mLs by mouth every 6 (six) hours as needed.  . Divalproex Sodium (DEPAKOTE ER PO) Take 1,000 mg by mouth every 8 (eight) hours. Open and put in pudding to prevent seizures  . gabapentin (NEURONTIN) 100 MG capsule Take 200 mg by mouth at bedtime.   Marland Kitchen LORazepam (ATIVAN) 2 MG/ML injection Inject 0.5 mLs (1 mg total) into the muscle every 15 (fifteen) minutes as needed. Up to 3 doses for uncontrolled seizure activity  . Menthol, Topical Analgesic, (BIOFREEZE) 4 % GEL Apply 1 application topically 2 (two) times daily.  Apply to left shoulder   . metoprolol tartrate (LOPRESSOR) 25 MG tablet Take 12.5 mg by mouth 2 (two) times daily.  . NON FORMULARY Regular Diet  . omeprazole (PRILOSEC) 40 MG capsule Take 40 mg by mouth daily.   Marland Kitchen senna (SENOKOT) 8.6 MG TABS tablet Take 2 tablets by mouth 2 (two) times daily.   . [DISCONTINUED] Cholecalciferol 1.25 MG (50000 UT) capsule Take 1 capsule (50,000 Units total) by mouth once a week. Once a day on mon   No facility-administered encounter medications on file as of 04/14/2020.     SIGNIFICANT DIAGNOSTIC EXAMS   PREVIOUS:   08-24-18: ct of  head: 1. 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 Increased volume loss of the infarcted brain as well as decreased herniation of brain and dura through the craniectomy defect. 2. No new intracranial abnormality.  NO NEW EXAMS.   LABS REVIEWED PREVIOUS:   05-20-19: wbc 9.1; hgb 12.6; hct 43.6; mcv 87.6 plt 196; chol 151; ldl 84; trig 185; hdl 30 vit D 24.00 PSA 1.60 07-29-19: : glucose 97; bun 11; creat 0.85; k+ 4.1; na++ 140; ca 9.2 liver normal albumin 3.2 hgb a1c 4.6 08-21-19: vit D 24.16  11-07-19: vit D 46.75 12-05-19: wbc 10.5; hgb 14.2; hct 47.5; mcv 94.1 plt 158; glucose 84; bun 11; creat 0.80 k+ 4.1; na++ 140; ca 9.2 liver normal albumin 3.1 hgb a1c 4.9 chol 132; ldl 77; trig 110; hdl 33  01-09-20: vit D 43.63 02-06-20: urine micro-albumin: 27.4  03-13-20: glucose 116; bun 12; creat 0.67; k+ 3.8; na++ 136; ca 8.8 liver normal albumin 3.1 vit D 29.72 03-28-20: wbc 15.5; hgb 15.6; hct 51.7; mcv 93.5 plt clump; glucose 153; bun 16; creat 0.99; k+ 4.4; na++ 137; ca 9.6 tsh 0.811; hgb a1c 4.9;  03-29-20: chol 143 ldl 96 trig 83 hdl 30 HIV nr  04-06-20: wbc 10.9; hgb 12.7; hct 42.2; mcv 95.5 plt 171; glucose 91; bun 11; creat 0.76; k+ 4.7; na++ 140; ca 9.0 liver normal albumin 2.9 depakote 34   NO NEW LABS.    Review of Systems  Constitutional: Negative for malaise/fatigue.  Respiratory:  Negative for cough and shortness of breath.   Cardiovascular: Negative for chest pain, palpitations and leg swelling.  Gastrointestinal: Negative for abdominal pain, constipation and heartburn.  Musculoskeletal: Negative for back pain, joint pain and myalgias.  Skin: Negative.   Neurological: Negative for dizziness.  Psychiatric/Behavioral: The patient is not nervous/anxious.       Physical Exam Constitutional:      General: He is not in acute distress.    Appearance: He is well-developed. He is not diaphoretic.  Neck:     Thyroid: No thyromegaly.  Cardiovascular:     Rate and Rhythm: Normal rate and regular rhythm.     Pulses: Normal pulses.     Heart sounds: Normal heart sounds.  Pulmonary:     Effort: Pulmonary effort is normal. No respiratory distress.     Breath sounds: Normal breath sounds.  Abdominal:     General: Bowel sounds are normal. There is no distension.     Palpations: Abdomen is soft.     Tenderness: There is no abdominal tenderness.  Musculoskeletal:     Cervical back: Neck supple.     Right lower leg: No edema.     Left lower leg: No edema.     Comments: Left hemiplegia with stiffness present   Lymphadenopathy:     Cervical: No cervical adenopathy.  Skin:    General: Skin is warm and dry.  Neurological:     Mental Status: He is alert. Mental status is at baseline.  Psychiatric:        Mood and Affect: Mood normal.           ASSESSMENT/ PLAN:  TODAY;   1. Hypertension associated with type 2 diabetes mellitus is table b/p 121/74 will continue lopressor 12.5 mg twice daily   2. Type 2 diabetes mellitus with neurological complications: is stable hgb a1c 4.9 is diet controlled  3. Diabetic peripheral neuropathy: is stable will continue gabapentin 200 mg nightly    PREVIOUS  4. Chronic constipation is stable will continue senna 2 tabs twice daily   5. Cerebrovascular accident (CVA) due to embolism of right middle cerebral artery: s/p  compression right frontotemporoparietal craniotomy with duroplasty 2018: is stable will continue eliquis 5 mg twice daily   6. Vit D deficiency level 29.72 will continue 50,000 units weekly   7. 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 GERD without esophagitis: is stable will continue prilosec 40 mg daily   9. Seizures is stable no reports of seizure activity. Will continue depakote sprinkles  1 gm every 8 hours will monitor there are no reports of missed doses   10. Dyslipidemia associated with type 2 diabetes mellitus  LDL 96 will increase to lipitor 80 mg daily    11. AF (paroxysmal atrial fibrillation) heart rate is stable will continue lopressor 12.5 mg twice daily for rate control eliquis 5 mg twice daily           MD is aware of resident's narcotic use and is in agreement with current plan of care. We will attempt to wean resident as appropriate.  Ok Edwards NP [facility]  Contact [phone] Monday through Friday 8am- 5pm  After hours call [phone]

## 2020-04-16 ENCOUNTER — Other Ambulatory Visit (HOSPITAL_COMMUNITY)
Admission: RE | Admit: 2020-04-16 | Discharge: 2020-04-16 | Disposition: A | Discharge status: Home / Self Care | Payer: Medicaid Other | Source: Skilled Nursing Facility | Attending: Adult Health | Admitting: Adult Health

## 2020-04-16 DIAGNOSIS — G4089 Other seizures: Secondary | ICD-10-CM | POA: Diagnosis present

## 2020-04-16 LAB — VALPROIC ACID LEVEL: Valproic Acid Lvl: 56 ug/mL (ref 50.0–100.0)

## 2020-04-20 ENCOUNTER — Non-Acute Institutional Stay (SKILLED_NURSING_FACILITY): Payer: Medicaid Other | Admitting: Adult Health

## 2020-04-20 ENCOUNTER — Encounter: Payer: Self-pay | Admitting: Adult Health

## 2020-04-20 DIAGNOSIS — I63411 Cerebral infarction due to embolism of right middle cerebral artery: Secondary | ICD-10-CM | POA: Diagnosis not present

## 2020-04-20 DIAGNOSIS — K5909 Other constipation: Secondary | ICD-10-CM | POA: Diagnosis not present

## 2020-04-20 DIAGNOSIS — R569 Unspecified convulsions: Secondary | ICD-10-CM | POA: Diagnosis not present

## 2020-04-20 NOTE — Progress Notes (Signed)
Location:    [place] Room Number: 117/D Place of Service:  SNF (31)   CODE STATUS: Full Code  Allergies  Allergen Reactions  . Cheese   . Penicillins Hives    Has patient had a PCN reaction causing immediate rash, facial/tongue/throat swelling, SOB or lightheadedness with hypotension: Unknown Has patient had a PCN reaction causing severe rash involving mucus membranes or skin necrosis: Unknown Has patient had a PCN reaction that required hospitalization: No Has patient had a PCN reaction occurring within the last 10 years: No If all of the above answers are "NO", then may proceed with Cephalosporin use.     Chief Complaint  Patient presents with  . Short Term Rehab (STR)          Seizures   Chronic constipation:  Cerebrovascular accident (CVA) due to embolism of right middle cerebral artery:   Weekly follow up for the first 30 days post hospitalization.     HPI:  He is a 59 year old long term resident of this facility being seen for the management of her chronic illnesses: Seizures   Chronic constipation:  Cerebrovascular accident (CVA) due to embolism of right middle cerebral artery. There are no reports of uncontrolled pain; no reports of seizure activity. No reports of constipation. No reports of heart burn. He is declining his medications at times.   Past Medical History:  Diagnosis Date  . DM (diabetes mellitus) ([place])   . Hypertension   . Stroke [facility])     Past Surgical History:  Procedure Laterality Date  . CRANIECTOMY Right 04/01/2017   Procedure: RIGHT DECOMPRESSIVE CRANIECTOMY;  Surgeon: Ditty, Kevan Ny, MD;  Location: [place];  Service: Neurosurgery;  Laterality: Right;  . ESOPHAGOGASTRODUODENOSCOPY N/A 04/13/2017   Procedure: ESOPHAGOGASTRODUODENOSCOPY (EGD);  Surgeon: Georganna Skeans, MD;  Location: [place];  Service: General;  Laterality: N/A;  bedside  . INCISION AND DRAINAGE PERIRECTAL ABSCESS N/A 09/28/2017   Procedure:  IRRIGATION AND DEBRIDEMENT PERIANAL ABSCESS;  Surgeon: Virl Cagey, MD;  Location: [facility];  Service: General;  Laterality: N/A;  . PEG PLACEMENT N/A 04/13/2017   Procedure: PERCUTANEOUS ENDOSCOPIC GASTROSTOMY (PEG) PLACEMENT;  Surgeon: Georganna Skeans, MD;  Location: [facility] END[facility]COPY;  Service: General;  Laterality: N/A;    Social History   Socioeconomic History  . Marital status: Unknown    Spouse name: Not on file  . Number of children: Not on file  . Years of education: Not on file  . Highest education level: Not on file  Occupational History  . Not on file  Tobacco Use  . Smoking status: Former Smoker    Types: Cigarettes  . Smokeless tobacco: Never Used  . Tobacco comment: UTA  Vaping Use  . Vaping Use: Never used  Substance and Sexual Activity  . Alcohol use: No    Comment: UTA  . Drug use: No    Comment: UTA  . Sexual activity: Not Currently    Birth control/protection: None  Other Topics Concern  . Not on file  Social History Narrative  . Not on file   Social Determinants of Health   Financial Resource Strain:   . Difficulty of Paying Living Expenses: Not on file  Food Insecurity:   . Worried About Charity fundraiser in the Last Year: Not on file  . Ran Out of Food in the Last Year: Not on file  Transportation Needs:   . Lack of Transportation (Medical): Not on file  . Lack  of Transportation (Non-Medical): Not on file  Physical Activity:   . Days of Exercise per Week: Not on file  . Minutes of Exercise per Session: Not on file  Stress:   . Feeling of Stress : Not on file  Social Connections:   . Frequency of Communication with Friends and Family: Not on file  . Frequency of Social Gatherings with Friends and Family: Not on file  . Attends Religious Services: Not on file  . Active Member of Clubs or Organizations: Not on file  . Attends Archivist Meetings: Not on file  . Marital Status: Not on file  Intimate Partner Violence:   . Fear of  Current or Ex-Partner: Not on file  . Emotionally Abused: Not on file  . Physically Abused: Not on file  . Sexually Abused: Not on file   Family History  Problem Relation Age of Onset  . Hypertension Father       VITAL SIGNS BP 132/64   Pulse 64   Temp 98.1 F (36.7 C) (Oral)   Resp 20   Ht 5\' 6"  (1.676 m)   Wt 180 lb (81.6 kg)   BMI 29.05 kg/m   Outpatient Encounter Medications as of 04/20/2020  Medication Sig  . acetaminophen (TYLENOL) 325 MG tablet Take 650 mg by mouth every 4 (four) hours as needed. Not to exceed 3 grams in 24 hours  . apixaban (ELIQUIS) 5 MG TABS tablet Take 5 mg by mouth 2 (two) times daily.   Marland Kitchen atorvastatin (LIPITOR) 40 MG tablet Take 80 mg by mouth daily. For Dyslipidemia  . Baclofen 5 MG TABS Take 5 mg by mouth 3 (three) times daily.  . bisacodyl (DULCOLAX) 10 MG suppository Place 1 suppository (10 mg total) rectally daily as needed for moderate constipation.  . Cholecalciferol 1.25 MG (50000 UT) capsule Take 50,000 Units by mouth 2 (two) times a week. For Low Vitamin D Level  . coal tar (NEUTROGENA T-GEL) 0.5 % shampoo Apply 1 application topically See admin instructions. Apply to scalp on shower days twice a week Wed And Sat prn  . Dextromethorphan-guaiFENesin (ROBITUSSIN COUGH+CHEST CONG DM PO) Take 10 mLs by mouth every 6 (six) hours as needed.  . Divalproex Sodium (DEPAKOTE ER PO) Take 1,000 mg by mouth every 8 (eight) hours. Open and put in pudding to prevent seizures  . gabapentin (NEURONTIN) 100 MG capsule Take 200 mg by mouth at bedtime.   Marland Kitchen LORazepam (ATIVAN) 2 MG/ML injection Inject 0.5 mLs (1 mg total) into the muscle every 15 (fifteen) minutes as needed. Up to 3 doses for uncontrolled seizure activity  . Menthol, Topical Analgesic, (BIOFREEZE) 4 % GEL Apply 1 application topically 2 (two) times daily. Apply to left shoulder   . metoprolol tartrate (LOPRESSOR) 25 MG tablet Take 12.5 mg by mouth 2 (two) times daily.  . NON FORMULARY Regular  Diet  . omeprazole (PRILOSEC) 40 MG capsule Take 40 mg by mouth daily.   Marland Kitchen senna (SENOKOT) 8.6 MG TABS tablet Take 2 tablets by mouth 2 (two) times daily.   . [DISCONTINUED] Amino Acids-Protein Hydrolys (FEEDING SUPPLEMENT, PRO-STAT 64,) LIQD Take 30 mLs by mouth daily.    No facility-administered encounter medications on file as of 04/20/2020.     SIGNIFICANT DIAGNOSTIC EXAMS  PREVIOUS:   08-24-18: ct of head: 1. 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 Increased volume loss of the infarcted brain as well as decreased herniation of brain and dura through the craniectomy  defect. 2. No new intracranial abnormality.  NO NEW EXAMS.   LABS REVIEWED PREVIOUS:   05-20-19: wbc 9.1; hgb 12.6; hct 43.6; mcv 87.6 plt 196; chol 151; ldl 84; trig 185; hdl 30 vit D 24.00 PSA 1.60 07-29-19: : glucose 97; bun 11; creat 0.85; k+ 4.1; na++ 140; ca 9.2 liver normal albumin 3.2 hgb a1c 4.6 08-21-19: vit D 24.16  11-07-19: vit D 46.75 12-05-19: wbc 10.5; hgb 14.2; hct 47.5; mcv 94.1 plt 158; glucose 84; bun 11; creat 0.80 k+ 4.1; na++ 140; ca 9.2 liver normal albumin 3.1 hgb a1c 4.9 chol 132; ldl 77; trig 110; hdl 33  01-09-20: vit D 43.63 02-06-20: urine micro-albumin: 27.4  03-13-20: glucose 116; bun 12; creat 0.67; k+ 3.8; na++ 136; ca 8.8 liver normal albumin 3.1 vit D 29.72 03-28-20: wbc 15.5; hgb 15.6; hct 51.7; mcv 93.5 plt clump; glucose 153; bun 16; creat 0.99; k+ 4.4; na++ 137; ca 9.6 tsh 0.811; hgb a1c 4.9;  03-29-20: chol 143 ldl 96 trig 83 hdl 30 HIV nr  04-06-20: wbc 10.9; hgb 12.7; hct 42.2; mcv 95.5 plt 171; glucose 91; bun 11; creat 0.76; k+ 4.7; na++ 140; ca 9.0 liver normal albumin 2.9 depakote 34   TODAY  04-16-20: depakote 56     Review of Systems  Constitutional: Negative for malaise/fatigue.  Respiratory: Negative for cough and shortness of breath.   Cardiovascular: Negative for chest pain, palpitations and leg swelling.  Gastrointestinal: Negative for  abdominal pain, constipation and heartburn.  Musculoskeletal: Negative for back pain, joint pain and myalgias.  Skin: Negative.   Neurological: Negative for dizziness.  Psychiatric/Behavioral: The patient is not nervous/anxious.       Physical Exam Constitutional:      General: He is not in acute distress.    Appearance: He is well-developed. He is not diaphoretic.  Neck:     Thyroid: No thyromegaly.  Cardiovascular:     Rate and Rhythm: Normal rate and regular rhythm.     Pulses: Normal pulses.     Heart sounds: Normal heart sounds.  Pulmonary:     Effort: Pulmonary effort is normal. No respiratory distress.     Breath sounds: Normal breath sounds.  Abdominal:     General: Bowel sounds are normal. There is no distension.     Palpations: Abdomen is soft.     Tenderness: There is no abdominal tenderness.  Musculoskeletal:     Cervical back: Neck supple.     Right lower leg: No edema.     Left lower leg: No edema.     Comments:  Left hemiplegia with stiffness present    Lymphadenopathy:     Cervical: No cervical adenopathy.  Skin:    General: Skin is warm and dry.  Neurological:     Mental Status: He is alert. Mental status is at baseline.  Psychiatric:        Mood and Affect: Mood normal.           ASSESSMENT/ PLAN:  TODAY;   1. Seizures is stable no reports of seizure activity. Will continue depakote sprinkles 1 gm every 8 hours; will decline medications at times.   2. Chronic constipation: is stable will continue senna 2 tabs twice daily   3. Cerebrovascular accident (CVA) due to embolism of right middle cerebral artery: is status post compression right frontotemporoparietal craniotomy with duraplasty 2018: is stable will continue eliquis 5 mg twice daily    PREVIOUS   4. Vit D deficiency level  29.72 will continue 50,000 units weekly   5. Hemiplegia affecting left side as late effect of cerebrovascular accident (CVA) is stable will continue baclofen 5 mg  three times daily for spasticity and biofreeze to left shoulder twice daily   6. GERD without esophagitis: is stable will continue prilosec 40 mg daily   7. Dyslipidemia associated with type 2 diabetes mellitus  LDL 96 will increase to lipitor 80 mg daily    8. AF (paroxysmal atrial fibrillation) heart rate is stable will continue lopressor 12.5 mg twice daily for rate control eliquis 5 mg twice daily   9. Hypertension associated with type 2 diabetes mellitus is table b/p 132/64 will continue lopressor 12.5 mg twice daily   10. Type 2 diabetes mellitus with neurological complications: is stable hgb a1c 4.9 is diet controlled  11. Diabetic peripheral neuropathy: is stable will continue gabapentin 200 mg nightly             MD is aware of resident's narcotic use and is in agreement with current plan of care. We will attempt to wean resident as appropriate.  Ok Edwards NP [facility]  Contact [phone] Monday through Friday 8am- 5pm  After hours call [phone]

## 2020-04-22 ENCOUNTER — Inpatient Hospital Stay: Payer: Medicaid Other | Admitting: Neurology

## 2020-04-22 DIAGNOSIS — G40909 Epilepsy, unspecified, not intractable, without status epilepticus: Secondary | ICD-10-CM

## 2020-05-04 ENCOUNTER — Encounter: Payer: Self-pay | Admitting: Adult Health

## 2020-05-04 ENCOUNTER — Non-Acute Institutional Stay (SKILLED_NURSING_FACILITY): Payer: Medicaid Other | Admitting: Adult Health

## 2020-05-04 DIAGNOSIS — K219 Gastro-esophageal reflux disease without esophagitis: Secondary | ICD-10-CM | POA: Diagnosis not present

## 2020-05-04 DIAGNOSIS — E559 Vitamin D deficiency, unspecified: Secondary | ICD-10-CM

## 2020-05-04 DIAGNOSIS — I69354 Hemiplegia and hemiparesis following cerebral infarction affecting left non-dominant side: Secondary | ICD-10-CM

## 2020-05-04 NOTE — Progress Notes (Signed)
Location:    [place] Room Number: 117-D Place of Service:  SNF (31)   CODE STATUS: Full Code   Allergies  Allergen Reactions  . Cheese   . Penicillins Hives    Has patient had a PCN reaction causing immediate rash, facial/tongue/throat swelling, SOB or lightheadedness with hypotension: Unknown Has patient had a PCN reaction causing severe rash involving mucus membranes or skin necrosis: Unknown Has patient had a PCN reaction that required hospitalization: No Has patient had a PCN reaction occurring within the last 10 years: No If all of the above answers are "NO", then may proceed with Cephalosporin use.     Chief Complaint  Patient presents with  . Medical Management of Chronic Issues          Vit D deficiency:  Hemiplegia affecting left side as late effect of cerebrovascular accident (CVA)   GERD without esophagitis:    HPI:  He is a 59 year old long term resident of this facility being seen for the management of his chronic illnesses: Vit D deficiency:  Hemiplegia affecting left side as late effect of cerebrovascular accident (CVA)   GERD without esophagitis. There are no reports of uncontrolled pain. He denies any heart burn. No reports of constipation. No reports of him declining medications in the recent past.   Past Medical History:  Diagnosis Date  . DM (diabetes mellitus) ([place])   . Hypertension   . Stroke [facility])     Past Surgical History:  Procedure Laterality Date  . CRANIECTOMY Right 04/01/2017   Procedure: RIGHT DECOMPRESSIVE CRANIECTOMY;  Surgeon: Ditty, Kevan Ny, MD;  Location: [place];  Service: Neurosurgery;  Laterality: Right;  . ESOPHAGOGASTRODUODENOSCOPY N/A 04/13/2017   Procedure: ESOPHAGOGASTRODUODENOSCOPY (EGD);  Surgeon: Georganna Skeans, MD;  Location: [place];  Service: General;  Laterality: N/A;  bedside  . INCISION AND DRAINAGE PERIRECTAL ABSCESS N/A 09/28/2017   Procedure: IRRIGATION AND DEBRIDEMENT PERIANAL ABSCESS;   Surgeon: Virl Cagey, MD;  Location: [facility];  Service: General;  Laterality: N/A;  . PEG PLACEMENT N/A 04/13/2017   Procedure: PERCUTANEOUS ENDOSCOPIC GASTROSTOMY (PEG) PLACEMENT;  Surgeon: Georganna Skeans, MD;  Location: [facility] ENDOSCOPY;  Service: General;  Laterality: N/A;    Social History   Socioeconomic History  . Marital status: Unknown    Spouse name: Not on file  . Number of children: Not on file  . Years of education: Not on file  . Highest education level: Not on file  Occupational History  . Not on file  Tobacco Use  . Smoking status: Former Smoker    Types: Cigarettes  . Smokeless tobacco: Never Used  . Tobacco comment: UTA  Vaping Use  . Vaping Use: Never used  Substance and Sexual Activity  . Alcohol use: No    Comment: UTA  . Drug use: No    Comment: UTA  . Sexual activity: Not Currently    Birth control/protection: None  Other Topics Concern  . Not on file  Social History Narrative  . Not on file   Social Determinants of Health   Financial Resource Strain:   . Difficulty of Paying Living Expenses: Not on file  Food Insecurity:   . Worried About Charity fundraiser in the Last Year: Not on file  . Ran Out of Food in the Last Year: Not on file  Transportation Needs:   . Lack of Transportation (Medical): Not on file  . Lack of Transportation (Non-Medical): Not on file  Physical Activity:   . Days of Exercise per Week: Not on file  . Minutes of Exercise per Session: Not on file  Stress:   . Feeling of Stress : Not on file  Social Connections:   . Frequency of Communication with Friends and Family: Not on file  . Frequency of Social Gatherings with Friends and Family: Not on file  . Attends Religious Services: Not on file  . Active Member of Clubs or Organizations: Not on file  . Attends Archivist Meetings: Not on file  . Marital Status: Not on file  Intimate Partner Violence:   . Fear of Current or Ex-Partner: Not on file  .  Emotionally Abused: Not on file  . Physically Abused: Not on file  . Sexually Abused: Not on file   Family History  Problem Relation Age of Onset  . Hypertension Father       VITAL SIGNS BP 117/71   Pulse 67   Temp 97.9 F (36.6 C)   Resp 20   Ht 5\' 6"  (1.676 m)   Wt 180 lb (81.6 kg)   SpO2 95%   BMI 29.05 kg/m   Outpatient Encounter Medications as of 05/04/2020  Medication Sig  . acetaminophen (TYLENOL) 325 MG tablet Take 650 mg by mouth every 4 (four) hours as needed. Not to exceed 3 grams in 24 hours  . apixaban (ELIQUIS) 5 MG TABS tablet Take 5 mg by mouth 2 (two) times daily.   Marland Kitchen atorvastatin (LIPITOR) 80 MG tablet Take 80 mg by mouth daily.  . Baclofen 5 MG TABS Take 5 mg by mouth 3 (three) times daily.  . bisacodyl (DULCOLAX) 10 MG suppository Place 1 suppository (10 mg total) rectally daily as needed for moderate constipation.  . Cholecalciferol 1.25 MG (50000 UT) capsule Take 50,000 Units by mouth 2 (two) times a week. For Low Vitamin D Level  . coal tar (NEUTROGENA T-GEL) 0.5 % shampoo Apply 1 application topically See admin instructions. Apply to scalp on shower days twice a week Wed And Sat prn  . Dextromethorphan-guaiFENesin (ROBITUSSIN COUGH+CHEST CONG DM PO) Take 10 mLs by mouth every 6 (six) hours as needed.  . Divalproex Sodium (DEPAKOTE ER PO) Take 1,000 mg by mouth every 8 (eight) hours. Open and put in pudding to prevent seizures  . gabapentin (NEURONTIN) 100 MG capsule Take 200 mg by mouth at bedtime.   Marland Kitchen LORazepam (ATIVAN) 2 MG/ML injection Inject 0.5 mLs (1 mg total) into the muscle every 15 (fifteen) minutes as needed. Up to 3 doses for uncontrolled seizure activity  . Menthol, Topical Analgesic, (BIOFREEZE) 4 % GEL Apply 1 application topically 2 (two) times daily. Apply to left shoulder   . metoprolol tartrate (LOPRESSOR) 25 MG tablet Take 12.5 mg by mouth 2 (two) times daily.  . NON FORMULARY Regular Diet  . omeprazole (PRILOSEC) 40 MG capsule Take 40  mg by mouth daily.   Marland Kitchen senna (SENOKOT) 8.6 MG TABS tablet Take 2 tablets by mouth 2 (two) times daily.   . [DISCONTINUED] atorvastatin (LIPITOR) 40 MG tablet Take 80 mg by mouth daily. For Dyslipidemia   No facility-administered encounter medications on file as of 05/04/2020.     SIGNIFICANT DIAGNOSTIC EXAMS   PREVIOUS:   08-24-18: ct of head: 1. 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 Increased volume loss of the infarcted brain as well as decreased herniation of brain and dura through the craniectomy defect. 2. No new intracranial abnormality.  NO NEW EXAMS.   LABS REVIEWED PREVIOUS:   05-20-19: wbc 9.1; hgb 12.6; hct 43.6; mcv 87.6 plt 196; chol 151; ldl 84; trig 185; hdl 30 vit D 24.00 PSA 1.60 07-29-19: : glucose 97; bun 11; creat 0.85; k+ 4.1; na++ 140; ca 9.2 liver normal albumin 3.2 hgb a1c 4.6 08-21-19: vit D 24.16  11-07-19: vit D 46.75 12-05-19: wbc 10.5; hgb 14.2; hct 47.5; mcv 94.1 plt 158; glucose 84; bun 11; creat 0.80 k+ 4.1; na++ 140; ca 9.2 liver normal albumin 3.1 hgb a1c 4.9 chol 132; ldl 77; trig 110; hdl 33  01-09-20: vit D 43.63 02-06-20: urine micro-albumin: 27.4  03-13-20: glucose 116; bun 12; creat 0.67; k+ 3.8; na++ 136; ca 8.8 liver normal albumin 3.1 vit D 29.72 03-28-20: wbc 15.5; hgb 15.6; hct 51.7; mcv 93.5 plt clump; glucose 153; bun 16; creat 0.99; k+ 4.4; na++ 137; ca 9.6 tsh 0.811; hgb a1c 4.9;  03-29-20: chol 143 ldl 96 trig 83 hdl 30 HIV nr  04-06-20: wbc 10.9; hgb 12.7; hct 42.2; mcv 95.5 plt 171; glucose 91; bun 11; creat 0.76; k+ 4.7; na++ 140; ca 9.0 liver normal albumin 2.9 depakote 34  04-16-20: depakote 56    NO NEW LABS.    Review of Systems  Constitutional: Negative for malaise/fatigue.  Respiratory: Negative for cough and shortness of breath.   Cardiovascular: Negative for chest pain, palpitations and leg swelling.  Gastrointestinal: Negative for abdominal pain, constipation and heartburn.  Musculoskeletal:  Negative for back pain, joint pain and myalgias.  Skin: Negative.   Neurological: Negative for dizziness.  Psychiatric/Behavioral: The patient is not nervous/anxious.       Physical Exam Constitutional:      General: He is not in acute distress.    Appearance: He is well-developed. He is not diaphoretic.  Neck:     Thyroid: No thyromegaly.  Cardiovascular:     Rate and Rhythm: Normal rate and regular rhythm.     Pulses: Normal pulses.     Heart sounds: Normal heart sounds.  Pulmonary:     Effort: Pulmonary effort is normal. No respiratory distress.     Breath sounds: Normal breath sounds.  Abdominal:     General: Bowel sounds are normal. There is no distension.     Palpations: Abdomen is soft.     Tenderness: There is no abdominal tenderness.  Musculoskeletal:     Cervical back: Neck supple.     Right lower leg: No edema.     Left lower leg: No edema.     Comments: Left hemiplegia with stiffness   Lymphadenopathy:     Cervical: No cervical adenopathy.  Skin:    General: Skin is warm and dry.  Neurological:     Mental Status: He is alert. Mental status is at baseline.  Psychiatric:        Mood and Affect: Mood normal.          ASSESSMENT/ PLAN:  TODAY;   1. Vit D deficiency: level 29.72. will continue vit D 50,000 weekly  2. 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 GERD without esophagitis: is stable will continue prilosec 40 mg daily    PREVIOUS   4. Dyslipidemia associated with type 2 diabetes mellitus  LDL 96 will continue lipitor 80 mg daily    5. AF (paroxysmal atrial fibrillation) heart  rate is stable will continue lopressor 12.5 mg twice daily for rate control eliquis 5 mg twice daily   6. Hypertension associated with type 2 diabetes mellitus is table b/p 132/64 will continue lopressor 12.5 mg twice  daily   7. Type 2 diabetes mellitus with neurological complications: is stable hgb a1c 4.9 is diet controlled  8. Diabetic peripheral neuropathy: is stable will continue gabapentin 200 mg nightly   9. Seizures is stable no reports of seizure activity. Will continue depakote sprinkles 1 gm every 8 hours; will decline medications at times.   10. Chronic constipation: is stable will continue senna 2 tabs twice daily   11. Cerebrovascular accident (CVA) due to embolism of right middle cerebral artery: is status post compression right frontotemporoparietal craniotomy with duraplasty 2018: is stable will continue eliquis 5 mg twice daily           MD is aware of resident's narcotic use and is in agreement with current plan of care. We will attempt to wean resident as appropriate.  Ok Edwards NP [facility]  Contact [phone] Monday through Friday 8am- 5pm  After hours call [phone]

## 2020-05-09 NOTE — Procedures (Signed)
   HISTORY: 59 year old male with history of stroke, seizure  TECHNIQUE:  This is a routine 16 channel EEG recording with one channel devoted to a limited EKG recording.  It was performed during wakefulness, drowsiness and asleep.  Hyperventilation was not performed and photic stimulation were performed as activating procedures.  There are minimum muscle and movement artifact noted.  Upon maximum arousal, posterior dominant waking rhythm consistent of rhythmic alpha range activity, with frequency of 11 hz. Activities are symmetric over the bilateral posterior derivations and attenuated with eye opening.  Photic stimulation did not alter the tracing.  During EEG recording, patient developed drowsiness and no deeper stage of sleep was achieved  During EEG recording, there was no epileptiform discharge noted.  EKG demonstrate sinus rhythm, with heart rate of 88 bpm  CONCLUSION: This is a  normal normal awake EEG.  There is no electrodiagnostic evidence of epileptiform discharge.  Marcial Pacas, M.D. Ph.D.  [facility] [place], [place] [address] Phone: [phone] Fax:      [phone]

## 2020-05-11 ENCOUNTER — Telehealth: Payer: Self-pay

## 2020-05-11 NOTE — Telephone Encounter (Signed)
-----   Message from Frann Rider, NP sent at 05/11/2020  9:52 AM EDT ----- Please advise patient/facility that recent EEG did not show any seizure activity or abnormality but do advise he remain on Depakote for seizure prevention

## 2020-05-11 NOTE — Telephone Encounter (Signed)
Spoke to [place] the nurse for Jiraiya Mcewan is a 60 y.o. male. She was notified if the message below.

## 2020-06-04 ENCOUNTER — Non-Acute Institutional Stay (SKILLED_NURSING_FACILITY): Payer: Medicaid Other | Admitting: Adult Health

## 2020-06-04 ENCOUNTER — Encounter: Payer: Self-pay | Admitting: Adult Health

## 2020-06-04 DIAGNOSIS — E1169 Type 2 diabetes mellitus with other specified complication: Secondary | ICD-10-CM

## 2020-06-04 DIAGNOSIS — R569 Unspecified convulsions: Secondary | ICD-10-CM

## 2020-06-04 DIAGNOSIS — I152 Hypertension secondary to endocrine disorders: Secondary | ICD-10-CM

## 2020-06-04 DIAGNOSIS — E1142 Type 2 diabetes mellitus with diabetic polyneuropathy: Secondary | ICD-10-CM

## 2020-06-04 DIAGNOSIS — E1159 Type 2 diabetes mellitus with other circulatory complications: Secondary | ICD-10-CM

## 2020-06-04 DIAGNOSIS — I63411 Cerebral infarction due to embolism of right middle cerebral artery: Secondary | ICD-10-CM | POA: Diagnosis not present

## 2020-06-04 DIAGNOSIS — E559 Vitamin D deficiency, unspecified: Secondary | ICD-10-CM

## 2020-06-04 DIAGNOSIS — K219 Gastro-esophageal reflux disease without esophagitis: Secondary | ICD-10-CM | POA: Diagnosis not present

## 2020-06-04 DIAGNOSIS — I480 Paroxysmal atrial fibrillation: Secondary | ICD-10-CM

## 2020-06-04 DIAGNOSIS — E785 Hyperlipidemia, unspecified: Secondary | ICD-10-CM

## 2020-06-04 DIAGNOSIS — I69354 Hemiplegia and hemiparesis following cerebral infarction affecting left non-dominant side: Secondary | ICD-10-CM

## 2020-06-04 DIAGNOSIS — E1149 Type 2 diabetes mellitus with other diabetic neurological complication: Secondary | ICD-10-CM

## 2020-06-04 DIAGNOSIS — K5909 Other constipation: Secondary | ICD-10-CM

## 2020-06-04 NOTE — Progress Notes (Signed)
Provider:Debbie Rolly Salter NP  Location   [place]  PCP: Gerlene Fee, NP  Extended Emergency Contact Information Primary Emergency Contact: Theresia [place] Mobile Phone: [phone] Relation: Sister Secondary Emergency Contact: [place] Mobile Phone: [phone] Relation: Aunt  Codes status: Full Code Goals of care: advanced directive information Advanced Directives 06/04/2020  Does Patient Have a Medical Advance Directive? Yes  Type of Advance Directive -  Does patient want to make changes to medical advance directive? No - Patient declined  Copy of [place] in Chart? -  Would patient like information on creating a medical advance directive? -     Allergies  Allergen Reactions   Cheese    Penicillins Hives    Has patient had a PCN reaction causing immediate rash, facial/tongue/throat swelling, SOB or lightheadedness with hypotension: Unknown Has patient had a PCN reaction causing severe rash involving mucus membranes or skin necrosis: Unknown Has patient had a PCN reaction that required hospitalization: No Has patient had a PCN reaction occurring within the last 10 years: No If all of the above answers are "NO", then may proceed with Cephalosporin use.     Chief Complaint  Patient presents with   Annual Exam    Annual Exam    HPI  He is a 59 year old long term resident of this facility being seen for his annual exam. He was hospitalized in august of this year for new onset seizures. He has been having difficulty taking his medications and has spit them out frequently. He is now in a different room; there have been no reports of him spitting out medications. He denies any pain; no changes in appetite; weight is stable. Overall his status is stable at this time. He continues to be followed for his chronic illnesses including: cva; afib hypertension.   Past Medical  History:  Diagnosis Date   DM (diabetes mellitus) ([place])    Hypertension    Stroke [facility])    Past Surgical History:  Procedure Laterality Date   CRANIECTOMY Right 04/01/2017   Procedure: RIGHT DECOMPRESSIVE CRANIECTOMY;  Surgeon: Ditty, Kevan Ny, MD;  Location: [place];  Service: Neurosurgery;  Laterality: Right;   ESOPHAGOGASTRODUODENOSCOPY N/A 04/13/2017   Procedure: ESOPHAGOGASTRODUODENOSCOPY (EGD);  Surgeon: Georganna Skeans, MD;  Location: [place];  Service: General;  Laterality: N/A;  bedside   INCISION AND DRAINAGE PERIRECTAL ABSCESS N/A 09/28/2017   Procedure: IRRIGATION AND DEBRIDEMENT PERIANAL ABSCESS;  Surgeon: Virl Cagey, MD;  Location: [facility];  Service: General;  Laterality: N/A;   PEG PLACEMENT N/A 04/13/2017   Procedure: PERCUTANEOUS ENDOSCOPIC GASTROSTOMY (PEG) PLACEMENT;  Surgeon: Georganna Skeans, MD;  Location: [place];  Service: General;  Laterality: N/A;    reports that he has quit smoking. His smoking use included cigarettes. He has never used smokeless tobacco. He reports that he does not drink alcohol and does not use drugs. Social History   Tobacco Use   Smoking status: Former Smoker    Types: Cigarettes   Smokeless tobacco: Never Used   Tobacco comment: UTA  Vaping Use   Vaping Use: Never used  Substance Use Topics   Alcohol use: No    Comment: UTA   Drug use: No    Comment: UTA   Family History  Problem Relation Age of Onset   Hypertension Father     Pertinent  Health Maintenance Due  Topic Date Due   FOOT EXAM  06/04/2020  OPHTHALMOLOGY EXAM  06/19/2020   HEMOGLOBIN A1C  09/28/2020   URINE MICROALBUMIN  02/05/2021   INFLUENZA VACCINE  Completed   COLONOSCOPY  Discontinued   Fall Risk  05/26/2019 05/26/2019 10/10/2017 09/13/2017 09/04/2017  Falls in the past year? 0 0 No No No  Risk for fall due to : - - - Impaired balance/gait;Impaired mobility;Medication side effect;Mental status change -   Depression screen  [facility] 2/9 05/26/2019 05/26/2019 09/13/2017 09/04/2017  Decreased Interest 0 0 0 0  Down, Depressed, Hopeless - 0 0 0  PHQ - 2 Score 0 0 0 0       Outpatient Encounter Medications as of 06/04/2020  Medication Sig   acetaminophen (TYLENOL) 325 MG tablet Take 650 mg by mouth every 4 (four) hours as needed. Not to exceed 3 grams in 24 hours   apixaban (ELIQUIS) 5 MG TABS tablet Take 5 mg by mouth 2 (two) times daily.    atorvastatin (LIPITOR) 80 MG tablet Take 80 mg by mouth daily.   Baclofen 5 MG TABS Take 5 mg by mouth 3 (three) times daily.   bisacodyl (DULCOLAX) 10 MG suppository Place 1 suppository (10 mg total) rectally daily as needed for moderate constipation.   Cholecalciferol 1.25 MG (50000 UT) capsule Take 50,000 Units by mouth once a week. For Low Vitamin D Level on Monday   coal tar (NEUTROGENA T-GEL) 0.5 % shampoo Apply 1 application topically See admin instructions. Apply to scalp on shower days twice a week Wed And Sat prn   Dextromethorphan-guaiFENesin (ROBITUSSIN COUGH+CHEST CONG DM PO) Take 10 mLs by mouth every 6 (six) hours as needed.   divalproex (DEPAKOTE SPRINKLE) 125 MG capsule Take 1,000 mg by mouth every 8 (eight) hours. Special Instructions: open and put in pudding to prevent seizures.   gabapentin (NEURONTIN) 100 MG capsule Take 200 mg by mouth at bedtime.    LORazepam (ATIVAN) 2 MG/ML injection Inject 0.5 mLs (1 mg total) into the muscle every 15 (fifteen) minutes as needed. Up to 3 doses for uncontrolled seizure activity   Menthol, Topical Analgesic, (BIOFREEZE) 4 % GEL Apply 1 application topically 2 (two) times daily. Apply to left shoulder    metoprolol tartrate (LOPRESSOR) 25 MG tablet Take 12.5 mg by mouth 2 (two) times daily.   NON FORMULARY Regular Diet   omeprazole (PRILOSEC) 40 MG capsule Take 40 mg by mouth daily.    senna (SENOKOT) 8.6 MG TABS tablet Take 2 tablets by mouth 2 (two) times daily.    [DISCONTINUED] Divalproex Sodium (DEPAKOTE  ER PO) Take 1,000 mg by mouth every 8 (eight) hours. Open and put in pudding to prevent seizures   No facility-administered encounter medications on file as of 06/04/2020.     Vitals:   06/04/20 1211  BP: 125/71  Pulse: 77  Temp: (!) 97.4 F (36.3 C)  Weight: 185 lb 9.6 oz (84.2 kg)  Height: 5\' 6"  (1.676 m)   Body mass index is 29.96 kg/m.  DIAGNOSTIC EXAMS   PREVIOUS:   08-24-18: ct of head: 1. 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 Increased volume loss of the infarcted brain as well as decreased herniation of brain and dura through the craniectomy defect. 2. No new intracranial abnormality.  NO NEW EXAMS.   LABS REVIEWED PREVIOUS:   05-20-19: wbc 9.1; hgb 12.6; hct 43.6; mcv 87.6 plt 196; chol 151; ldl 84; trig 185; hdl 30 vit D 24.00 PSA 1.60 07-29-19: : glucose 97; bun 11; creat 0.85;  k+ 4.1; na++ 140; ca 9.2 liver normal albumin 3.2 hgb a1c 4.6 08-21-19: vit D 24.16  11-07-19: vit D 46.75 12-05-19: wbc 10.5; hgb 14.2; hct 47.5; mcv 94.1 plt 158; glucose 84; bun 11; creat 0.80 k+ 4.1; na++ 140; ca 9.2 liver normal albumin 3.1 hgb a1c 4.9 chol 132; ldl 77; trig 110; hdl 33  01-09-20: vit D 43.63 02-06-20: urine micro-albumin: 27.4  03-13-20: glucose 116; bun 12; creat 0.67; k+ 3.8; na++ 136; ca 8.8 liver normal albumin 3.1 vit D 29.72 03-28-20: wbc 15.5; hgb 15.6; hct 51.7; mcv 93.5 plt clump; glucose 153; bun 16; creat 0.99; k+ 4.4; na++ 137; ca 9.6 tsh 0.811; hgb a1c 4.9;  03-29-20: chol 143 ldl 96 trig 83 hdl 30 HIV nr  04-06-20: wbc 10.9; hgb 12.7; hct 42.2; mcv 95.5 plt 171; glucose 91; bun 11; creat 0.76; k+ 4.7; na++ 140; ca 9.0 liver normal albumin 2.9 depakote 34  04-16-20: depakote 56    NO NEW LABS.    Review of Systems  Constitutional: Negative for malaise/fatigue.  Respiratory: Negative for cough and shortness of breath.   Cardiovascular: Negative for chest pain, palpitations and leg swelling.  Gastrointestinal: Negative for  abdominal pain, constipation and heartburn.  Musculoskeletal: Negative for back pain, joint pain and myalgias.  Skin: Negative.   Neurological: Negative for dizziness.  Psychiatric/Behavioral: The patient is not nervous/anxious.     Physical Exam Constitutional:      General: He is not in acute distress.    Appearance: He is well-developed. He is not diaphoretic.  HENT:     Right Ear: Tympanic membrane normal.     Left Ear: Tympanic membrane normal.     Nose: Nose normal.     Mouth/Throat:     Mouth: Mucous membranes are moist.     Pharynx: Oropharynx is clear.  Eyes:     Conjunctiva/sclera: Conjunctivae normal.  Neck:     Thyroid: No thyromegaly.  Cardiovascular:     Rate and Rhythm: Normal rate and regular rhythm.     Pulses: Normal pulses.     Heart sounds: Normal heart sounds.  Pulmonary:     Effort: Pulmonary effort is normal. No respiratory distress.     Breath sounds: Normal breath sounds.  Abdominal:     General: Bowel sounds are normal. There is no distension.     Palpations: Abdomen is soft.     Tenderness: There is no abdominal tenderness.  Musculoskeletal:     Cervical back: Neck supple.     Right lower leg: No edema.     Left lower leg: No edema.     Comments: Left hemiplegia with stiffness    Lymphadenopathy:     Cervical: No cervical adenopathy.  Skin:    General: Skin is warm and dry.  Neurological:     Mental Status: He is alert. Mental status is at baseline.  Psychiatric:        Mood and Affect: Mood normal.    ASSESSMENT/ PLAN:  TODAY;   1. Dyslipidemia associated with type 2 diabetes mellitus: is stable LDL 96 will continue lipitor 80 mg daily   2. AF (paroxsymal atrial fibrillation) heart rate is stable will continue lopressor 12.5 mg twice daily for rate control and eliquis 5 mg twice daily   3. Hypertension associated with type 2 diabetes mellitus: is stable b/p 125/71 will continue lopressor 12.5 mg twice daily   4. Type 2 diabetes  with neurological complications: is stable hgb a1c 4.9;  is diet controlled   5. Diabetic peripheral neuropathy: is stable will continue gabapentin 200 mg nightly   6. Seizures: is stable no recent seizure activity: will continue depakote sprinkles 1 gm every 8 hours; no recent reports of declining medications.   7. Chronic constipation: is stable will continue senna 2 tabs twice daily   8. Cerebrovascular accident (CVA) due to embolism of right middle cerebral artery: is status post compression right frontotemporopartietal craniotomy with duraplasty 2018; is stable will continue elquis 5 mg twice daily   9. Vit D deficiency: level is 29.72.  Will continue vit D 50,000 weekly   10. 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 GERD without esophagitis: is stable will continue prilosec 40 mg daily     MD is aware of resident's narcotic use and is in agreement with current plan of care. We will attempt to wean resident as appropriate.  Ok Edwards NP [facility]  Contact [phone] Monday through Friday 8am- 5pm  After hours call [phone]

## 2020-06-04 NOTE — Progress Notes (Deleted)
Location:    [place] Room Number: 152/D Place of Service:  SNF (31)   CODE STATUS: Full Code  Allergies  Allergen Reactions  . Cheese   . Penicillins Hives    Has patient had a PCN reaction causing immediate rash, facial/tongue/throat swelling, SOB or lightheadedness with hypotension: Unknown Has patient had a PCN reaction causing severe rash involving mucus membranes or skin necrosis: Unknown Has patient had a PCN reaction that required hospitalization: No Has patient had a PCN reaction occurring within the last 10 years: No If all of the above answers are "NO", then may proceed with Cephalosporin use.     Chief Complaint  Patient presents with  . Annual Exam    Annual Exam    HPI:    Past Medical History:  Diagnosis Date  . DM (diabetes mellitus) ([place])   . Hypertension   . Stroke [facility])     Past Surgical History:  Procedure Laterality Date  . CRANIECTOMY Right 04/01/2017   Procedure: RIGHT DECOMPRESSIVE CRANIECTOMY;  Surgeon: Ditty, Kevan Ny, MD;  Location: [place];  Service: Neurosurgery;  Laterality: Right;  . ESOPHAGOGASTRODUODENOSCOPY N/A 04/13/2017   Procedure: ESOPHAGOGASTRODUODENOSCOPY (EGD);  Surgeon: Georganna Skeans, MD;  Location: [place];  Service: General;  Laterality: N/A;  bedside  . INCISION AND DRAINAGE PERIRECTAL ABSCESS N/A 09/28/2017   Procedure: IRRIGATION AND DEBRIDEMENT PERIANAL ABSCESS;  Surgeon: Virl Cagey, MD;  Location: [facility];  Service: General;  Laterality: N/A;  . PEG PLACEMENT N/A 04/13/2017   Procedure: PERCUTANEOUS ENDOSCOPIC GASTROSTOMY (PEG) PLACEMENT;  Surgeon: Georganna Skeans, MD;  Location: [facility] ENDOSCOPY;  Service: General;  Laterality: N/A;    Social History   Socioeconomic History  . Marital status: Unknown    Spouse name: Not on file  . Number of children: Not on file  . Years of education: Not on file  . Highest education level: Not on file  Occupational History  . Not on file    Tobacco Use  . Smoking status: Former Smoker    Types: Cigarettes  . Smokeless tobacco: Never Used  . Tobacco comment: UTA  Vaping Use  . Vaping Use: Never used  Substance and Sexual Activity  . Alcohol use: No    Comment: UTA  . Drug use: No    Comment: UTA  . Sexual activity: Not Currently    Birth control/protection: None  Other Topics Concern  . Not on file  Social History Narrative  . Not on file   Social Determinants of Health   Financial Resource Strain:   . Difficulty of Paying Living Expenses: Not on file  Food Insecurity:   . Worried About Charity fundraiser in the Last Year: Not on file  . Ran Out of Food in the Last Year: Not on file  Transportation Needs:   . Lack of Transportation (Medical): Not on file  . Lack of Transportation (Non-Medical): Not on file  Physical Activity:   . Days of Exercise per Week: Not on file  . Minutes of Exercise per Session: Not on file  Stress:   . Feeling of Stress : Not on file  Social Connections:   . Frequency of Communication with Friends and Family: Not on file  . Frequency of Social Gatherings with Friends and Family: Not on file  . Attends Religious Services: Not on file  . Active Member of Clubs or Organizations: Not on file  . Attends Archivist Meetings: Not on file  .  Marital Status: Not on file  Intimate Partner Violence:   . Fear of Current or Ex-Partner: Not on file  . Emotionally Abused: Not on file  . Physically Abused: Not on file  . Sexually Abused: Not on file   Family History  Problem Relation Age of Onset  . Hypertension Father       VITAL SIGNS BP 125/71   Pulse 77   Temp (!) 97.4 F (36.3 C)   Ht 5\' 6"  (1.676 m)   Wt 185 lb 9.6 oz (84.2 kg)   BMI 29.96 kg/m   Outpatient Encounter Medications as of 06/04/2020  Medication Sig  . acetaminophen (TYLENOL) 325 MG tablet Take 650 mg by mouth every 4 (four) hours as needed. Not to exceed 3 grams in 24 hours  . apixaban (ELIQUIS)  5 MG TABS tablet Take 5 mg by mouth 2 (two) times daily.   Marland Kitchen atorvastatin (LIPITOR) 80 MG tablet Take 80 mg by mouth daily.  . Baclofen 5 MG TABS Take 5 mg by mouth 3 (three) times daily.  . bisacodyl (DULCOLAX) 10 MG suppository Place 1 suppository (10 mg total) rectally daily as needed for moderate constipation.  . Cholecalciferol 1.25 MG (50000 UT) capsule Take 50,000 Units by mouth once a week. For Low Vitamin D Level on Monday  . coal tar (NEUTROGENA T-GEL) 0.5 % shampoo Apply 1 application topically See admin instructions. Apply to scalp on shower days twice a week Wed And Sat prn  . Dextromethorphan-guaiFENesin (ROBITUSSIN COUGH+CHEST CONG DM PO) Take 10 mLs by mouth every 6 (six) hours as needed.  . divalproex (DEPAKOTE SPRINKLE) 125 MG capsule Take 1,000 mg by mouth every 8 (eight) hours. Special Instructions: open and put in pudding to prevent seizures.  . gabapentin (NEURONTIN) 100 MG capsule Take 200 mg by mouth at bedtime.   Marland Kitchen LORazepam (ATIVAN) 2 MG/ML injection Inject 0.5 mLs (1 mg total) into the muscle every 15 (fifteen) minutes as needed. Up to 3 doses for uncontrolled seizure activity  . Menthol, Topical Analgesic, (BIOFREEZE) 4 % GEL Apply 1 application topically 2 (two) times daily. Apply to left shoulder   . metoprolol tartrate (LOPRESSOR) 25 MG tablet Take 12.5 mg by mouth 2 (two) times daily.  . NON FORMULARY Regular Diet  . omeprazole (PRILOSEC) 40 MG capsule Take 40 mg by mouth daily.   Marland Kitchen senna (SENOKOT) 8.6 MG TABS tablet Take 2 tablets by mouth 2 (two) times daily.   . [DISCONTINUED] Divalproex Sodium (DEPAKOTE ER PO) Take 1,000 mg by mouth every 8 (eight) hours. Open and put in pudding to prevent seizures   No facility-administered encounter medications on file as of 06/04/2020.     SIGNIFICANT DIAGNOSTIC EXAMS       ASSESSMENT/ PLAN:    MD is aware of resident's narcotic use and is in agreement with current plan of care. We will attempt to wean resident  as appropriate.  Ok Edwards NP [facility]  Contact [phone] Monday through Friday 8am- 5pm  After hours call [phone]

## 2020-06-17 ENCOUNTER — Encounter: Payer: Self-pay | Admitting: Adult Health

## 2020-06-17 ENCOUNTER — Non-Acute Institutional Stay (SKILLED_NURSING_FACILITY): Payer: Medicaid Other | Admitting: Adult Health

## 2020-06-17 DIAGNOSIS — I480 Paroxysmal atrial fibrillation: Secondary | ICD-10-CM

## 2020-06-17 DIAGNOSIS — R569 Unspecified convulsions: Secondary | ICD-10-CM

## 2020-06-17 NOTE — Progress Notes (Signed)
Location:    [place] Room Number: 152/D Place of Service:  SNF (31)   CODE STATUS: Full Code  Allergies  Allergen Reactions   Cheese    Penicillins Hives    Has patient had a PCN reaction causing immediate rash, facial/tongue/throat swelling, SOB or lightheadedness with hypotension: Unknown Has patient had a PCN reaction causing severe rash involving mucus membranes or skin necrosis: Unknown Has patient had a PCN reaction that required hospitalization: No Has patient had a PCN reaction occurring within the last 10 years: No If all of the above answers are "NO", then may proceed with Cephalosporin use.     Chief Complaint  Patient presents with   Acute Visit    Medication Management    HPI:  He frequently declines medications. He will spit them out. They are crushed. He will verbally deny that he is spitting out medications. The nursing concerns are that he is at higher risk for cva and seizures. He states that he feels good. He denies any pain. He states that his appetite is good. He states that he has a good appetite and is sleeping at night.   Past Medical History:  Diagnosis Date   DM (diabetes mellitus) ([place])    Hypertension    Stroke [facility])     Past Surgical History:  Procedure Laterality Date   CRANIECTOMY Right 04/01/2017   Procedure: RIGHT DECOMPRESSIVE CRANIECTOMY;  Surgeon: Ditty, Kevan Ny, MD;  Location: [place];  Service: Neurosurgery;  Laterality: Right;   ESOPHAGOGASTRODUODENOSCOPY N/A 04/13/2017   Procedure: ESOPHAGOGASTRODUODENOSCOPY (EGD);  Surgeon: Georganna Skeans, MD;  Location: [place];  Service: General;  Laterality: N/A;  bedside   INCISION AND DRAINAGE PERIRECTAL ABSCESS N/A 09/28/2017   Procedure: IRRIGATION AND DEBRIDEMENT PERIANAL ABSCESS;  Surgeon: Virl Cagey, MD;  Location: [facility];  Service: General;  Laterality: N/A;   PEG PLACEMENT N/A 04/13/2017   Procedure: PERCUTANEOUS ENDOSCOPIC GASTROSTOMY  (PEG) PLACEMENT;  Surgeon: Georganna Skeans, MD;  Location: [facility] ENDOSCOPY;  Service: General;  Laterality: N/A;    Social History   Socioeconomic History   Marital status: Unknown    Spouse name: Not on file   Number of children: Not on file   Years of education: Not on file   Highest education level: Not on file  Occupational History   Not on file  Tobacco Use   Smoking status: Former Smoker    Types: Cigarettes   Smokeless tobacco: Never Used   Tobacco comment: UTA  Vaping Use   Vaping Use: Never used  Substance and Sexual Activity   Alcohol use: No    Comment: UTA   Drug use: No    Comment: UTA   Sexual activity: Not Currently    Birth control/protection: None  Other Topics Concern   Not on file  Social History Narrative   Not on file   Social Determinants of Health   Financial Resource Strain:    Difficulty of Paying Living Expenses: Not on file  Food Insecurity:    Worried About [place] in the Last Year: Not on file   [employer] of Food in the Last Year: Not on file  Transportation Needs:    Lack of Transportation (Medical): Not on file   Lack of Transportation (Non-Medical): Not on file  Physical Activity:    Days of Exercise per Week: Not on file   Minutes of Exercise per Session: Not on file  Stress:    Feeling  of Stress : Not on file  Social Connections:    Frequency of Communication with Friends and Family: Not on file   Frequency of Social Gatherings with Friends and Family: Not on file   Attends Religious Services: Not on file   Active Member of Clubs or Organizations: Not on file   Attends Archivist Meetings: Not on file   Marital Status: Not on file  Intimate Partner Violence:    Fear of Current or Ex-Partner: Not on file   Emotionally Abused: Not on file   Physically Abused: Not on file   Sexually Abused: Not on file   Family History  Problem Relation Age of Onset   Hypertension Father        VITAL SIGNS BP 133/81    Pulse 68    Temp 98 F (36.7 C)    Ht 5\' 6"  (1.676 m)    Wt 182 lb (82.6 kg)    BMI 29.38 kg/m   Outpatient Encounter Medications as of 06/17/2020  Medication Sig   acetaminophen (TYLENOL) 325 MG tablet Take 650 mg by mouth every 4 (four) hours as needed. Not to exceed 3 grams in 24 hours   apixaban (ELIQUIS) 5 MG TABS tablet Take 5 mg by mouth 2 (two) times daily.    atorvastatin (LIPITOR) 80 MG tablet Take 80 mg by mouth daily.   Baclofen 5 MG TABS Take 5 mg by mouth 3 (three) times daily.   bisacodyl (DULCOLAX) 10 MG suppository Place 1 suppository (10 mg total) rectally daily as needed for moderate constipation.   Cholecalciferol 1.25 MG (50000 UT) capsule Take 50,000 Units by mouth once a week. For Low Vitamin D Level on Monday   coal tar (NEUTROGENA T-GEL) 0.5 % shampoo Apply 1 application topically See admin instructions. Apply to scalp on shower days twice a week Wed And Sat prn   Dextromethorphan-guaiFENesin (ROBITUSSIN COUGH+CHEST CONG DM PO) Take 10 mLs by mouth every 6 (six) hours as needed.   gabapentin (NEURONTIN) 100 MG capsule Take 200 mg by mouth at bedtime.    levETIRAcetam (KEPPRA) 100 MG/ML solution Take 500 mg by mouth 2 (two) times daily. For Seizures Mix in Grape Juice   LORazepam (ATIVAN) 2 MG/ML injection Inject 0.5 mLs (1 mg total) into the muscle every 15 (fifteen) minutes as needed. Up to 3 doses for uncontrolled seizure activity   Menthol, Topical Analgesic, (BIOFREEZE) 4 % GEL Apply 1 application topically 2 (two) times daily. Apply to left shoulder    metoprolol tartrate (LOPRESSOR) 25 MG tablet Take 12.5 mg by mouth 2 (two) times daily.   NON FORMULARY Regular Diet   omeprazole (PRILOSEC) 40 MG capsule Take 40 mg by mouth daily.    senna (SENOKOT) 8.6 MG TABS tablet Take 2 tablets by mouth 2 (two) times daily.    [DISCONTINUED] divalproex (DEPAKOTE SPRINKLE) 125 MG capsule Take 1,000 mg by mouth every 8 (eight)  hours. Special Instructions: open and put in pudding to prevent seizures.   No facility-administered encounter medications on file as of 06/17/2020.     SIGNIFICANT DIAGNOSTIC EXAMS  PREVIOUS:   08-24-18: ct of head: 1. 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 Increased volume loss of the infarcted brain as well as decreased herniation of brain and dura through the craniectomy defect. 2. No new intracranial abnormality.  NO NEW EXAMS.   LABS REVIEWED PREVIOUS:   07-29-19: : glucose 97; bun 11; creat 0.85; k+ 4.1; na++ 140; ca  9.2 liver normal albumin 3.2 hgb a1c 4.6 08-21-19: vit D 24.16  11-07-19: vit D 46.75 12-05-19: wbc 10.5; hgb 14.2; hct 47.5; mcv 94.1 plt 158; glucose 84; bun 11; creat 0.80 k+ 4.1; na++ 140; ca 9.2 liver normal albumin 3.1 hgb a1c 4.9 chol 132; ldl 77; trig 110; hdl 33  01-09-20: vit D 43.63 02-06-20: urine micro-albumin: 27.4  03-13-20: glucose 116; bun 12; creat 0.67; k+ 3.8; na++ 136; ca 8.8 liver normal albumin 3.1 vit D 29.72 03-28-20: wbc 15.5; hgb 15.6; hct 51.7; mcv 93.5 plt clump; glucose 153; bun 16; creat 0.99; k+ 4.4; na++ 137; ca 9.6 tsh 0.811; hgb a1c 4.9;  03-29-20: chol 143 ldl 96 trig 83 hdl 30 HIV nr  04-06-20: wbc 10.9; hgb 12.7; hct 42.2; mcv 95.5 plt 171; glucose 91; bun 11; creat 0.76; k+ 4.7; na++ 140; ca 9.0 liver normal albumin 2.9 depakote 34  04-16-20: depakote 56    NO NEW LABS.    Review of Systems  Constitutional: Negative for malaise/fatigue.  Respiratory: Negative for cough and shortness of breath.   Cardiovascular: Negative for chest pain, palpitations and leg swelling.  Gastrointestinal: Negative for abdominal pain, constipation and heartburn.  Musculoskeletal: Negative for back pain, joint pain and myalgias.  Skin: Negative.   Neurological: Negative for dizziness.  Psychiatric/Behavioral: The patient is not nervous/anxious.       Physical Exam Constitutional:      General: He is not in acute  distress.    Appearance: He is well-developed. He is not diaphoretic.  Neck:     Thyroid: No thyromegaly.  Cardiovascular:     Rate and Rhythm: Normal rate and regular rhythm.     Pulses: Normal pulses.     Heart sounds: Normal heart sounds.  Pulmonary:     Effort: Pulmonary effort is normal. No respiratory distress.     Breath sounds: Normal breath sounds.  Abdominal:     General: Bowel sounds are normal. There is no distension.     Palpations: Abdomen is soft.     Tenderness: There is no abdominal tenderness.  Musculoskeletal:     Cervical back: Neck supple.     Right lower leg: No edema.     Left lower leg: No edema.     Comments:  Left hemiplegia with stiffness     Lymphadenopathy:     Cervical: No cervical adenopathy.  Skin:    General: Skin is warm and dry.  Neurological:     Mental Status: He is alert. Mental status is at baseline.  Psychiatric:        Mood and Affect: Mood normal.       ASSESSMENT/ PLAN:  TODAY  1. Seizures 2. AF(paroxysmal atrial fibrillation)   Will stop depakote Will place on keppra liquid 500 mg twice daily as this has little volume and will be easier for him to manage.  Will continue eliquis 5 mg twice daily even though he will decline this medication at times; even taking some of the time will give him some protection from stroke.     MD is aware of resident's narcotic use and is in agreement with current plan of care. We will attempt to wean resident as appropriate.  Ok Edwards NP [facility]  Contact [phone] Monday through Friday 8am- 5pm  After hours call [phone]

## 2020-06-25 ENCOUNTER — Non-Acute Institutional Stay (SKILLED_NURSING_FACILITY): Payer: Medicaid Other | Admitting: Adult Health

## 2020-06-25 ENCOUNTER — Encounter: Payer: Self-pay | Admitting: Adult Health

## 2020-06-25 DIAGNOSIS — I63411 Cerebral infarction due to embolism of right middle cerebral artery: Secondary | ICD-10-CM

## 2020-06-25 DIAGNOSIS — I69354 Hemiplegia and hemiparesis following cerebral infarction affecting left non-dominant side: Secondary | ICD-10-CM

## 2020-06-25 DIAGNOSIS — R569 Unspecified convulsions: Secondary | ICD-10-CM

## 2020-06-25 NOTE — Progress Notes (Signed)
Location:    [place] Room Number: 152/D Place of Service:  SNF (31)   CODE STATUS: Full Code  Allergies  Allergen Reactions  . Cheese   . Penicillins Hives    Has patient had a PCN reaction causing immediate rash, facial/tongue/throat swelling, SOB or lightheadedness with hypotension: Unknown Has patient had a PCN reaction causing severe rash involving mucus membranes or skin necrosis: Unknown Has patient had a PCN reaction that required hospitalization: No Has patient had a PCN reaction occurring within the last 10 years: No If all of the above answers are "NO", then may proceed with Cephalosporin use.     Chief Complaint  Patient presents with  . Acute Visit     Care Plan Meeting    HPI:  We have come together for his care plan meeting. BIMS 10/15 mood 0/30. He has been hospitalized in August of this year for new onset seizures. He will decline to take his medications at times; however he has not declined them in the past week. He spends all of his time in bed per his choice. Will have therapy screen him for tranfers. There are have no falls. He is incontinent of bladder and bowel. He requires extensive assist to dependent care for his adls; is able to feed himself. His weight is stable at 182 pounds with the range of 179-186 pounds. There are no reports of uncontrolled pain present. He continues to be followed for his chronic illnesses including: Cerebrovascular accident (CVA) due to embolism of right middle cerebral artery Hemiparesis affecting left side as late effect of cerebrovascular accident Seizure:   Past Medical History:  Diagnosis Date  . DM (diabetes mellitus) ([place])   . Hypertension   . Stroke [facility])     Past Surgical History:  Procedure Laterality Date  . CRANIECTOMY Right 04/01/2017   Procedure: RIGHT DECOMPRESSIVE CRANIECTOMY;  Surgeon: Ditty, Kevan Ny, MD;  Location: [place];  Service: Neurosurgery;  Laterality: Right;  .  ESOPHAGOGASTRODUODENOSCOPY N/A 04/13/2017   Procedure: ESOPHAGOGASTRODUODENOSCOPY (EGD);  Surgeon: Georganna Skeans, MD;  Location: [place];  Service: General;  Laterality: N/A;  bedside  . INCISION AND DRAINAGE PERIRECTAL ABSCESS N/A 09/28/2017   Procedure: IRRIGATION AND DEBRIDEMENT PERIANAL ABSCESS;  Surgeon: Virl Cagey, MD;  Location: [facility];  Service: General;  Laterality: N/A;  . PEG PLACEMENT N/A 04/13/2017   Procedure: PERCUTANEOUS ENDOSCOPIC GASTROSTOMY (PEG) PLACEMENT;  Surgeon: Georganna Skeans, MD;  Location: [facility] ENDOSCOPY;  Service: General;  Laterality: N/A;    Social History   Socioeconomic History  . Marital status: Unknown    Spouse name: Not on file  . Number of children: Not on file  . Years of education: Not on file  . Highest education level: Not on file  Occupational History  . Not on file  Tobacco Use  . Smoking status: Former Smoker    Types: Cigarettes  . Smokeless tobacco: Never Used  . Tobacco comment: UTA  Vaping Use  . Vaping Use: Never used  Substance and Sexual Activity  . Alcohol use: No    Comment: UTA  . Drug use: No    Comment: UTA  . Sexual activity: Not Currently    Birth control/protection: None  Other Topics Concern  . Not on file  Social History Narrative  . Not on file   Social Determinants of Health   Financial Resource Strain:   . Difficulty of Paying Living Expenses: Not on file  Food Insecurity:   .  Worried About Charity fundraiser in the Last Year: Not on file  . Ran Out of Food in the Last Year: Not on file  Transportation Needs:   . Lack of Transportation (Medical): Not on file  . Lack of Transportation (Non-Medical): Not on file  Physical Activity:   . Days of Exercise per Week: Not on file  . Minutes of Exercise per Session: Not on file  Stress:   . Feeling of Stress : Not on file  Social Connections:   . Frequency of Communication with Friends and Family: Not on file  . Frequency of Social Gatherings with  Friends and Family: Not on file  . Attends Religious Services: Not on file  . Active Member of Clubs or Organizations: Not on file  . Attends Archivist Meetings: Not on file  . Marital Status: Not on file  Intimate Partner Violence:   . Fear of Current or Ex-Partner: Not on file  . Emotionally Abused: Not on file  . Physically Abused: Not on file  . Sexually Abused: Not on file   Family History  Problem Relation Age of Onset  . Hypertension Father       VITAL SIGNS BP 138/75   Pulse 76   Temp 97.8 F (36.6 C)   Ht 5\' 6"  (1.676 m)   Wt 182 lb (82.6 kg)   BMI 29.38 kg/m   Outpatient Encounter Medications as of 06/25/2020  Medication Sig  . acetaminophen (TYLENOL) 325 MG tablet Take 650 mg by mouth every 4 (four) hours as needed. Not to exceed 3 grams in 24 hours  . apixaban (ELIQUIS) 5 MG TABS tablet Take 5 mg by mouth 2 (two) times daily.   Marland Kitchen atorvastatin (LIPITOR) 80 MG tablet Take 80 mg by mouth daily.  . Baclofen 5 MG TABS Take 5 mg by mouth 3 (three) times daily.  . bisacodyl (DULCOLAX) 10 MG suppository Place 1 suppository (10 mg total) rectally daily as needed for moderate constipation.  . Cholecalciferol 1.25 MG (50000 UT) capsule Take 50,000 Units by mouth once a week. For Low Vitamin D Level on Monday  . coal tar (NEUTROGENA T-GEL) 0.5 % shampoo Apply 1 application topically See admin instructions. Apply to scalp on shower days twice a week Wed And Sat prn  . Dextromethorphan-guaiFENesin (ROBITUSSIN COUGH+CHEST CONG DM PO) Take 10 mLs by mouth every 6 (six) hours as needed.  . gabapentin (NEURONTIN) 100 MG capsule Take 200 mg by mouth at bedtime.   . levETIRAcetam (KEPPRA) 100 MG/ML solution Take 500 mg by mouth 2 (two) times daily. For Seizures Mix in Grape Juice  . LORazepam (ATIVAN) 2 MG/ML injection Inject 0.5 mLs (1 mg total) into the muscle every 15 (fifteen) minutes as needed. Up to 3 doses for uncontrolled seizure activity  . Menthol, Topical  Analgesic, (BIOFREEZE) 4 % GEL Apply 1 application topically 2 (two) times daily. Apply to left shoulder   . metoprolol tartrate (LOPRESSOR) 25 MG tablet Take 12.5 mg by mouth 2 (two) times daily.  . NON FORMULARY Regular Diet  . omeprazole (PRILOSEC) 40 MG capsule Take 40 mg by mouth daily.   Marland Kitchen senna (SENOKOT) 8.6 MG TABS tablet Take 2 tablets by mouth 2 (two) times daily.    No facility-administered encounter medications on file as of 06/25/2020.     SIGNIFICANT DIAGNOSTIC EXAMS   PREVIOUS:   08-24-18: ct of head: 1. 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 Increased volume loss  of the infarcted brain as well as decreased herniation of brain and dura through the craniectomy defect. 2. No new intracranial abnormality.  NO NEW EXAMS.   LABS REVIEWED PREVIOUS:   07-29-19: : glucose 97; bun 11; creat 0.85; k+ 4.1; na++ 140; ca 9.2 liver normal albumin 3.2 hgb a1c 4.6 08-21-19: vit D 24.16  11-07-19: vit D 46.75 12-05-19: wbc 10.5; hgb 14.2; hct 47.5; mcv 94.1 plt 158; glucose 84; bun 11; creat 0.80 k+ 4.1; na++ 140; ca 9.2 liver normal albumin 3.1 hgb a1c 4.9 chol 132; ldl 77; trig 110; hdl 33  01-09-20: vit D 43.63 02-06-20: urine micro-albumin: 27.4  03-13-20: glucose 116; bun 12; creat 0.67; k+ 3.8; na++ 136; ca 8.8 liver normal albumin 3.1 vit D 29.72 03-28-20: wbc 15.5; hgb 15.6; hct 51.7; mcv 93.5 plt clump; glucose 153; bun 16; creat 0.99; k+ 4.4; na++ 137; ca 9.6 tsh 0.811; hgb a1c 4.9;  03-29-20: chol 143 ldl 96 trig 83 hdl 30 HIV nr  04-06-20: wbc 10.9; hgb 12.7; hct 42.2; mcv 95.5 plt 171; glucose 91; bun 11; creat 0.76; k+ 4.7; na++ 140; ca 9.0 liver normal albumin 2.9 depakote 34  04-16-20: depakote 56    NO NEW LABS.    Review of Systems  Constitutional: Negative for malaise/fatigue.  Respiratory: Negative for cough and shortness of breath.   Cardiovascular: Negative for chest pain, palpitations and leg swelling.  Gastrointestinal: Negative for  abdominal pain, constipation and heartburn.  Musculoskeletal: Negative for back pain, joint pain and myalgias.  Skin: Negative.   Neurological: Negative for dizziness.  Psychiatric/Behavioral: The patient is not nervous/anxious.     Physical Exam Constitutional:      General: He is not in acute distress.    Appearance: He is well-developed. He is not diaphoretic.  Neck:     Thyroid: No thyromegaly.  Cardiovascular:     Rate and Rhythm: Normal rate and regular rhythm.     Pulses: Normal pulses.     Heart sounds: Normal heart sounds.  Pulmonary:     Effort: Pulmonary effort is normal. No respiratory distress.     Breath sounds: Normal breath sounds.  Abdominal:     General: Bowel sounds are normal. There is no distension.     Palpations: Abdomen is soft.     Tenderness: There is no abdominal tenderness.  Musculoskeletal:     Cervical back: Neck supple.     Right lower leg: No edema.     Left lower leg: No edema.     Comments: Left hemiplegia with stiffness      Lymphadenopathy:     Cervical: No cervical adenopathy.  Skin:    General: Skin is warm and dry.  Neurological:     Mental Status: He is alert. Mental status is at baseline.  Psychiatric:        Mood and Affect: Mood normal.      ASSESSMENT/ PLAN:  TODAY  1. Cerebrovascular accident (CVA) due to embolism of right middle cerebral artery 2. Hemiparesis affecting left side as late effect of cerebrovascular accident 3. Seizure:    Will continue current medications Will continue current plan of care Will continue to monitor his status.        MD is aware of resident's narcotic use and is in agreement with current plan of care. We will attempt to wean resident as appropriate.  Ok Edwards NP [facility]  Contact [phone] Monday through Friday 8am- 5pm  After hours call [phone]

## 2020-07-07 ENCOUNTER — Non-Acute Institutional Stay (SKILLED_NURSING_FACILITY): Payer: Medicaid Other | Admitting: Adult Health

## 2020-07-07 DIAGNOSIS — I480 Paroxysmal atrial fibrillation: Secondary | ICD-10-CM | POA: Diagnosis not present

## 2020-07-07 DIAGNOSIS — E785 Hyperlipidemia, unspecified: Secondary | ICD-10-CM

## 2020-07-07 DIAGNOSIS — I152 Hypertension secondary to endocrine disorders: Secondary | ICD-10-CM

## 2020-07-07 DIAGNOSIS — E1169 Type 2 diabetes mellitus with other specified complication: Secondary | ICD-10-CM | POA: Diagnosis not present

## 2020-07-07 DIAGNOSIS — E1159 Type 2 diabetes mellitus with other circulatory complications: Secondary | ICD-10-CM

## 2020-07-07 NOTE — Progress Notes (Signed)
Location:  [place] Room Number: 152-D Place of Service:  SNF (31)   CODE STATUS: FULL CODE  Allergies  Allergen Reactions  . Cheese   . Penicillins Hives    Has patient had a PCN reaction causing immediate rash, facial/tongue/throat swelling, SOB or lightheadedness with hypotension: Unknown Has patient had a PCN reaction causing severe rash involving mucus membranes or skin necrosis: Unknown Has patient had a PCN reaction that required hospitalization: No Has patient had a PCN reaction occurring within the last 10 years: No If all of the above answers are "NO", then may proceed with Cephalosporin use.     Chief Complaint  Patient presents with  . Medical Management of Chronic Issues      Dyslipidemia associated with type 2 diabetes mellitus    AF (paroxsymal atrial fibrillation)  Hypertension associated with type 2 diabetes mellitus:     HPI:  He is a 59 year old long term resident of this facility being seen for the management of his chronic illnesses: Dyslipidemia associated with type 2 diabetes mellitus    AF (paroxsymal atrial fibrillation)  Hypertension associated with type 2 diabetes mellitus. There are no reports of uncontrolled pain; no changes in appetite; no heart palpitations.   Past Medical History:  Diagnosis Date  . DM (diabetes mellitus) ([place])   . Hypertension   . Stroke [facility])     Past Surgical History:  Procedure Laterality Date  . CRANIECTOMY Right 04/01/2017   Procedure: RIGHT DECOMPRESSIVE CRANIECTOMY;  Surgeon: Ditty, Kevan Ny, MD;  Location: [place];  Service: Neurosurgery;  Laterality: Right;  . ESOPHAGOGASTRODUODENOSCOPY N/A 04/13/2017   Procedure: ESOPHAGOGASTRODUODENOSCOPY (EGD);  Surgeon: Georganna Skeans, MD;  Location: [place];  Service: General;  Laterality: N/A;  bedside  . INCISION AND DRAINAGE PERIRECTAL ABSCESS N/A 09/28/2017   Procedure: IRRIGATION AND DEBRIDEMENT PERIANAL ABSCESS;  Surgeon: Virl Cagey, MD;  Location: [facility];  Service: General;  Laterality: N/A;  . PEG PLACEMENT N/A 04/13/2017   Procedure: PERCUTANEOUS ENDOSCOPIC GASTROSTOMY (PEG) PLACEMENT;  Surgeon: Georganna Skeans, MD;  Location: [facility] ENDOSCOPY;  Service: General;  Laterality: N/A;    Social History   Socioeconomic History  . Marital status: Unknown    Spouse name: Not on file  . Number of children: Not on file  . Years of education: Not on file  . Highest education level: Not on file  Occupational History  . Not on file  Tobacco Use  . Smoking status: Former Smoker    Types: Cigarettes  . Smokeless tobacco: Never Used  . Tobacco comment: UTA  Vaping Use  . Vaping Use: Never used  Substance and Sexual Activity  . Alcohol use: No    Comment: UTA  . Drug use: No    Comment: UTA  . Sexual activity: Not Currently    Birth control/protection: None  Other Topics Concern  . Not on file  Social History Narrative  . Not on file   Social Determinants of Health   Financial Resource Strain:   . Difficulty of Paying Living Expenses: Not on file  Food Insecurity:   . Worried About Charity fundraiser in the Last Year: Not on file  . Ran Out of Food in the Last Year: Not on file  Transportation Needs:   . Lack of Transportation (Medical): Not on file  . Lack of Transportation (Non-Medical): Not on file  Physical Activity:   . Days of Exercise per Week: Not on file  .  Minutes of Exercise per Session: Not on file  Stress:   . Feeling of Stress : Not on file  Social Connections:   . Frequency of Communication with Friends and Family: Not on file  . Frequency of Social Gatherings with Friends and Family: Not on file  . Attends Religious Services: Not on file  . Active Member of Clubs or Organizations: Not on file  . Attends Archivist Meetings: Not on file  . Marital Status: Not on file  Intimate Partner Violence:   . Fear of Current or Ex-Partner: Not on file  . Emotionally Abused: Not on  file  . Physically Abused: Not on file  . Sexually Abused: Not on file   Family History  Problem Relation Age of Onset  . Hypertension Father       VITAL SIGNS BP 100/68   Pulse 80   Temp 98.4 F (36.9 C)   Ht 5\' 6"  (1.676 m)   Wt 180 lb (81.6 kg)   BMI 29.05 kg/m   Outpatient Encounter Medications as of 07/07/2020  Medication Sig  . acetaminophen (TYLENOL) 325 MG tablet Take 650 mg by mouth every 4 (four) hours as needed. Not to exceed 3 grams in 24 hours  . apixaban (ELIQUIS) 5 MG TABS tablet Take 5 mg by mouth 2 (two) times daily.   Marland Kitchen atorvastatin (LIPITOR) 80 MG tablet Take 80 mg by mouth daily.  . Baclofen 5 MG TABS Take 5 mg by mouth 3 (three) times daily.  . bisacodyl (DULCOLAX) 10 MG suppository Place 1 suppository (10 mg total) rectally daily as needed for moderate constipation.  . Cholecalciferol 1.25 MG (50000 UT) capsule Take 50,000 Units by mouth once a week. For Low Vitamin D Level on Monday  . coal tar (NEUTROGENA T-GEL) 0.5 % shampoo Apply 1 application topically See admin instructions. Apply to scalp on shower days twice a week Wed And Sat prn  . Dextromethorphan-guaiFENesin (ROBITUSSIN COUGH+CHEST CONG DM PO) Take 10 mLs by mouth every 6 (six) hours as needed.  . gabapentin (NEURONTIN) 100 MG capsule Take 200 mg by mouth at bedtime.   . levETIRAcetam (KEPPRA) 100 MG/ML solution Take 500 mg by mouth 2 (two) times daily. For Seizures Mix in Grape Juice  . LORazepam (ATIVAN) 2 MG/ML injection Inject 0.5 mLs (1 mg total) into the muscle every 15 (fifteen) minutes as needed. Up to 3 doses for uncontrolled seizure activity  . Menthol, Topical Analgesic, (BIOFREEZE) 4 % GEL Apply 1 application topically 2 (two) times daily. Apply to left shoulder   . metoprolol tartrate (LOPRESSOR) 25 MG tablet Take 12.5 mg by mouth 2 (two) times daily.  . NON FORMULARY Regular Diet  . omeprazole (PRILOSEC) 40 MG capsule Take 40 mg by mouth daily.   Marland Kitchen senna (SENOKOT) 8.6 MG TABS  tablet Take 2 tablets by mouth 2 (two) times daily.    No facility-administered encounter medications on file as of 07/07/2020.     SIGNIFICANT DIAGNOSTIC EXAMS  PREVIOUS:   08-24-18: ct of head: 1. 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 Increased volume loss of the infarcted brain as well as decreased herniation of brain and dura through the craniectomy defect. 2. No new intracranial abnormality.  NO NEW EXAMS.   LABS REVIEWED PREVIOUS:   07-29-19: : glucose 97; bun 11; creat 0.85; k+ 4.1; na++ 140; ca 9.2 liver normal albumin 3.2 hgb a1c 4.6 08-21-19: vit D 24.16  11-07-19: vit D 46.75 12-05-19: wbc  10.5; hgb 14.2; hct 47.5; mcv 94.1 plt 158; glucose 84; bun 11; creat 0.80 k+ 4.1; na++ 140; ca 9.2 liver normal albumin 3.1 hgb a1c 4.9 chol 132; ldl 77; trig 110; hdl 33  01-09-20: vit D 43.63 02-06-20: urine micro-albumin: 27.4  03-13-20: glucose 116; bun 12; creat 0.67; k+ 3.8; na++ 136; ca 8.8 liver normal albumin 3.1 vit D 29.72 03-28-20: wbc 15.5; hgb 15.6; hct 51.7; mcv 93.5 plt clump; glucose 153; bun 16; creat 0.99; k+ 4.4; na++ 137; ca 9.6 tsh 0.811; hgb a1c 4.9;  03-29-20: chol 143 ldl 96 trig 83 hdl 30 HIV nr  04-06-20: wbc 10.9; hgb 12.7; hct 42.2; mcv 95.5 plt 171; glucose 91; bun 11; creat 0.76; k+ 4.7; na++ 140; ca 9.0 liver normal albumin 2.9 depakote 34  04-16-20: depakote 56    NO NEW LABS.    Review of Systems  Constitutional: Negative for malaise/fatigue.  Respiratory: Negative for cough and shortness of breath.   Cardiovascular: Negative for chest pain, palpitations and leg swelling.  Gastrointestinal: Negative for abdominal pain, constipation and heartburn.  Musculoskeletal: Negative for back pain, joint pain and myalgias.  Skin: Negative.   Neurological: Negative for dizziness.  Psychiatric/Behavioral: The patient is not nervous/anxious.      Physical Exam Constitutional:      General: He is not in acute distress.    Appearance:  He is well-developed. He is not diaphoretic.  Neck:     Thyroid: No thyromegaly.  Cardiovascular:     Rate and Rhythm: Normal rate and regular rhythm.     Pulses: Normal pulses.     Heart sounds: Normal heart sounds.  Pulmonary:     Effort: Pulmonary effort is normal. No respiratory distress.     Breath sounds: Normal breath sounds.  Abdominal:     General: Bowel sounds are normal. There is no distension.     Palpations: Abdomen is soft.     Tenderness: There is no abdominal tenderness.  Musculoskeletal:     Cervical back: Neck supple.     Right lower leg: No edema.     Left lower leg: No edema.     Comments: Left hemiplegia with stiffness       Lymphadenopathy:     Cervical: No cervical adenopathy.  Skin:    General: Skin is warm and dry.  Neurological:     Mental Status: He is alert. Mental status is at baseline.  Psychiatric:        Mood and Affect: Mood normal.     ASSESSMENT/ PLAN:  TODAY;   1. Dyslipidemia associated with type 2 diabetes mellitus is stable LDL 96 will continue lipitor 80 mg daily   2. AF (paroxsymal atrial fibrillation) heart rate is stable will continue lopressor 12.5 mg twice daily for rate control and is on eliquis 5 mg twice daily   3. Hypertension associated with type 2 diabetes mellitus: is stable b/p 125/71 will continue lopressor 12.5 mg twice daily   PREVIOUS   4. Type 2 diabetes with neurological complications: is stable hgb a1c 4.9; is diet controlled   5. Diabetic peripheral neuropathy: is stable will continue gabapentin 200 mg nightly   6. Seizures: is stable no recent seizure activity: will continue keppra 500 mg twice daily ; no recent reports of declining medications.   7. Chronic constipation: is stable will continue senna 2 tabs twice daily   8. Cerebrovascular accident (CVA) due to embolism of right middle cerebral artery: is status post compression  right frontotemporopartietal craniotomy with duraplasty 2018; is stable will  continue elquis 5 mg twice daily   9. Vit D deficiency: level is 29.72.  Will continue vit D 50,000 weekly   10. 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 GERD without esophagitis: is stable will continue prilosec 40 mg daily      MD is aware of resident's narcotic use and is in agreement with current plan of care. We will attempt to wean resident as appropriate.  Ok Edwards NP [facility]  Contact [phone] Monday through Friday 8am- 5pm  After hours call [phone]

## 2020-08-06 ENCOUNTER — Other Ambulatory Visit (HOSPITAL_COMMUNITY)
Admission: RE | Admit: 2020-08-06 | Discharge: 2020-08-06 | Disposition: A | Discharge status: Home / Self Care | Payer: Medicaid Other | Source: Skilled Nursing Facility | Attending: Internal Medicine | Admitting: Internal Medicine

## 2020-08-06 DIAGNOSIS — E559 Vitamin D deficiency, unspecified: Secondary | ICD-10-CM | POA: Diagnosis not present

## 2020-08-06 LAB — VITAMIN D 25 HYDROXY (VIT D DEFICIENCY, FRACTURES): Vit D, 25-Hydroxy: 40.07 ng/mL (ref 30–100)

## 2020-08-07 ENCOUNTER — Non-Acute Institutional Stay (SKILLED_NURSING_FACILITY): Payer: Medicaid Other | Admitting: Adult Health

## 2020-08-07 DIAGNOSIS — E1149 Type 2 diabetes mellitus with other diabetic neurological complication: Secondary | ICD-10-CM | POA: Diagnosis not present

## 2020-08-07 DIAGNOSIS — E1142 Type 2 diabetes mellitus with diabetic polyneuropathy: Secondary | ICD-10-CM

## 2020-08-07 DIAGNOSIS — R569 Unspecified convulsions: Secondary | ICD-10-CM | POA: Diagnosis not present

## 2020-08-11 ENCOUNTER — Encounter: Payer: Self-pay | Admitting: Adult Health

## 2020-08-11 NOTE — Progress Notes (Signed)
Location:  [facility] Room Number: 222 Place of Service:  SNF (31)   CODE STATUS: full code   Allergies  Allergen Reactions  . Cheese   . Penicillins Hives    Has patient had a PCN reaction causing immediate rash, facial/tongue/throat swelling, SOB or lightheadedness with hypotension: Unknown Has patient had a PCN reaction causing severe rash involving mucus membranes or skin necrosis: Unknown Has patient had a PCN reaction that required hospitalization: No Has patient had a PCN reaction occurring within the last 10 years: No If all of the above answers are "NO", then may proceed with Cephalosporin use.     Chief Complaint  Patient presents with  . Medical Management of Chronic Issues         Type 2 diabetes mellitus with neurological complications:  Diabetic peripheral neuropathy: Seizures:     HPI:  He is a 60 year old long term resident of this facility being seen for the management of his chronic illnesses: Type 2 diabetes mellitus with neurological complications:  Diabetic peripheral neuropathy: Seizures:. There are no reports of seizure activity. No reports of uncontrolled pain; no reports of anxiety or agitation. He rarely declines medications.   Past Medical History:  Diagnosis Date  . DM (diabetes mellitus) (HCC)   . Hypertension   . Stroke [facility])     Past Surgical History:  Procedure Laterality Date  . CRANIECTOMY Right 04/01/2017   Procedure: RIGHT DECOMPRESSIVE CRANIECTOMY;  Surgeon: Ditty, Loura Halt, MD;  Location: [facility] OR;  Service: Neurosurgery;  Laterality: Right;  . ESOPHAGOGASTRODUODENOSCOPY N/A 04/13/2017   Procedure: ESOPHAGOGASTRODUODENOSCOPY (EGD);  Surgeon: Violeta Gelinas, MD;  Location: [facility] ENDOSCOPY;  Service: General;  Laterality: N/A;  bedside  . INCISION AND DRAINAGE PERIRECTAL ABSCESS N/A 09/28/2017   Procedure: IRRIGATION AND DEBRIDEMENT PERIANAL ABSCESS;  Surgeon: Lucretia Roers, MD;  Location: [facility] ORS;  Service:  General;  Laterality: N/A;  . PEG PLACEMENT N/A 04/13/2017   Procedure: PERCUTANEOUS ENDOSCOPIC GASTROSTOMY (PEG) PLACEMENT;  Surgeon: Violeta Gelinas, MD;  Location: [facility] ENDOSCOPY;  Service: General;  Laterality: N/A;    Social History   Socioeconomic History  . Marital status: Unknown    Spouse name: Not on file  . Number of children: Not on file  . Years of education: Not on file  . Highest education level: Not on file  Occupational History  . Not on file  Tobacco Use  . Smoking status: Former Smoker    Types: Cigarettes  . Smokeless tobacco: Never Used  . Tobacco comment: UTA  Vaping Use  . Vaping Use: Never used  Substance and Sexual Activity  . Alcohol use: No    Comment: UTA  . Drug use: No    Comment: UTA  . Sexual activity: Not Currently    Birth control/protection: None  Other Topics Concern  . Not on file  Social History Narrative  . Not on file   Social Determinants of Health   Financial Resource Strain: Not on file  Food Insecurity: Not on file  Transportation Needs: Not on file  Physical Activity: Not on file  Stress: Not on file  Social Connections: Not on file  Intimate Partner Violence: Not on file   Family History  Problem Relation Age of Onset  . Hypertension Father       VITAL SIGNS BP (!) 124/53   Pulse 98   Temp 98 F (36.7 C)   Resp 16   Ht 5\' 6"  (1.676 m)  Wt 171 lb (77.6 kg)   SpO2 98%   BMI 27.60 kg/m   Outpatient Encounter Medications as of 08/07/2020  Medication Sig  . acetaminophen (TYLENOL) 325 MG tablet Take 650 mg by mouth every 4 (four) hours as needed. Not to exceed 3 grams in 24 hours  . apixaban (ELIQUIS) 5 MG TABS tablet Take 5 mg by mouth 2 (two) times daily.   Marland Kitchen atorvastatin (LIPITOR) 80 MG tablet Take 80 mg by mouth daily.  . Baclofen 5 MG TABS Take 5 mg by mouth 3 (three) times daily.  . bisacodyl (DULCOLAX) 10 MG suppository Place 1 suppository (10 mg total) rectally daily as needed for moderate constipation.   . Cholecalciferol 1.25 MG (50000 UT) capsule Take 50,000 Units by mouth once a week. For Low Vitamin D Level on Monday  . coal tar (NEUTROGENA T-GEL) 0.5 % shampoo Apply 1 application topically See admin instructions. Apply to scalp on shower days twice a week Wed And Sat prn  . Dextromethorphan-guaiFENesin (ROBITUSSIN COUGH+CHEST CONG DM PO) Take 10 mLs by mouth every 6 (six) hours as needed.  . gabapentin (NEURONTIN) 100 MG capsule Take 200 mg by mouth at bedtime.   . levETIRAcetam (KEPPRA) 100 MG/ML solution Take 500 mg by mouth 2 (two) times daily. For Seizures Mix in Grape Juice  . LORazepam (ATIVAN) 2 MG/ML injection Inject 0.5 mLs (1 mg total) into the muscle every 15 (fifteen) minutes as needed. Up to 3 doses for uncontrolled seizure activity  . Menthol, Topical Analgesic, (BIOFREEZE) 4 % GEL Apply 1 application topically 2 (two) times daily. Apply to left shoulder   . metoprolol tartrate (LOPRESSOR) 25 MG tablet Take 12.5 mg by mouth 2 (two) times daily.  . NON FORMULARY Regular Diet  . omeprazole (PRILOSEC) 40 MG capsule Take 40 mg by mouth daily.   Marland Kitchen senna (SENOKOT) 8.6 MG TABS tablet Take 2 tablets by mouth 2 (two) times daily.    No facility-administered encounter medications on file as of 08/07/2020.     SIGNIFICANT DIAGNOSTIC EXAMS   PREVIOUS:   08-24-18: ct of head: 1. 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 Increased volume loss of the infarcted brain as well as decreased herniation of brain and dura through the craniectomy defect. 2. No new intracranial abnormality.  NO NEW EXAMS.   LABS REVIEWED PREVIOUS:   07-29-19: : glucose 97; bun 11; creat 0.85; k+ 4.1; na++ 140; ca 9.2 liver normal albumin 3.2 hgb a1c 4.6 08-21-19: vit D 24.16  11-07-19: vit D 46.75 12-05-19: wbc 10.5; hgb 14.2; hct 47.5; mcv 94.1 plt 158; glucose 84; bun 11; creat 0.80 k+ 4.1; na++ 140; ca 9.2 liver normal albumin 3.1 hgb a1c 4.9 chol 132; ldl 77; trig 110; hdl  33  01-09-20: vit D 43.63 02-06-20: urine micro-albumin: 27.4  03-13-20: glucose 116; bun 12; creat 0.67; k+ 3.8; na++ 136; ca 8.8 liver normal albumin 3.1 vit D 29.72 03-28-20: wbc 15.5; hgb 15.6; hct 51.7; mcv 93.5 plt clump; glucose 153; bun 16; creat 0.99; k+ 4.4; na++ 137; ca 9.6 tsh 0.811; hgb a1c 4.9;  03-29-20: chol 143 ldl 96 trig 83 hdl 30 HIV nr  04-06-20: wbc 10.9; hgb 12.7; hct 42.2; mcv 95.5 plt 171; glucose 91; bun 11; creat 0.76; k+ 4.7; na++ 140; ca 9.0 liver normal albumin 2.9 depakote 34  04-16-20: depakote 56    TODAY  08-06-20: Vit D  40.07    Review of Systems  Constitutional: Negative for  malaise/fatigue.  Respiratory: Negative for cough and shortness of breath.   Cardiovascular: Negative for chest pain, palpitations and leg swelling.  Gastrointestinal: Negative for abdominal pain, constipation and heartburn.  Musculoskeletal: Negative for back pain, joint pain and myalgias.  Skin: Negative.   Neurological: Negative for dizziness.  Psychiatric/Behavioral: The patient is not nervous/anxious.     Physical Exam Constitutional:      General: He is not in acute distress.    Appearance: He is well-developed and well-nourished. He is not diaphoretic.  Neck:     Thyroid: No thyromegaly.  Cardiovascular:     Rate and Rhythm: Normal rate and regular rhythm.     Pulses: Intact distal pulses.     Heart sounds: Normal heart sounds.  Pulmonary:     Effort: Pulmonary effort is normal. No respiratory distress.     Breath sounds: Normal breath sounds.  Abdominal:     General: Bowel sounds are normal. There is no distension.     Palpations: Abdomen is soft.     Tenderness: There is no abdominal tenderness.  Musculoskeletal:        General: No edema.     Right lower leg: No edema.     Left lower leg: No edema.     Comments: Left hemiplegia with stiffness        Lymphadenopathy:     Cervical: No cervical adenopathy.  Skin:    General: Skin is warm and dry.  Neurological:      Mental Status: He is alert. Mental status is at baseline.  Psychiatric:        Mood and Affect: Mood and affect and mood normal.     ASSESSMENT/ PLAN:  TODAY;   1. Type 2 diabetes mellitus with neurological complications: is stable hgb a1c 4.9 diet controlled will monitor   2. Diabetic peripheral neuropathy: is stable will continue gabapentin 200 mg nightly   3. Seizures: is stable no recent seizure activity; will continue keppra 500 mg twice daily will at times decline medications.   PREVIOUS   4. Chronic constipation: is stable will continue senna 2 tabs twice daily   5. Cerebrovascular accident (CVA) due to embolism of right middle cerebral artery: is status post compression right frontotemporopartietal craniotomy with duraplasty 2018; is stable will continue elquis 5 mg twice daily   6. Vit D deficiency: level is 40.07.  Will continue vit D 50,000 weekly   7. 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 GERD without esophagitis: is stable will continue prilosec 40 mg daily   9. Dyslipidemia associated with type 2 diabetes mellitus is stable LDL 96 will continue lipitor 80 mg daily   10. AF (paroxsymal atrial fibrillation) heart rate is stable will continue lopressor 12.5 mg twice daily for rate control and is on eliquis 5 mg twice daily   11. Hypertension associated with type 2 diabetes mellitus: is stable b/p 124/53 will continue lopressor 12.5 mg twice daily    MD is aware of resident's narcotic use and is in agreement with current plan of care. We will attempt to wean resident as appropriate.  Ok Edwards NP [facility]  Contact [phone] Monday through Friday 8am- 5pm  After hours call [phone]

## 2020-09-07 ENCOUNTER — Encounter: Payer: Self-pay | Admitting: Adult Health

## 2020-09-07 ENCOUNTER — Non-Acute Institutional Stay (SKILLED_NURSING_FACILITY): Payer: Medicaid Other | Admitting: Adult Health

## 2020-09-07 DIAGNOSIS — K5909 Other constipation: Secondary | ICD-10-CM | POA: Diagnosis not present

## 2020-09-07 DIAGNOSIS — I63411 Cerebral infarction due to embolism of right middle cerebral artery: Secondary | ICD-10-CM | POA: Diagnosis not present

## 2020-09-07 DIAGNOSIS — E559 Vitamin D deficiency, unspecified: Secondary | ICD-10-CM | POA: Diagnosis not present

## 2020-09-07 DIAGNOSIS — Z9889 Other specified postprocedural states: Secondary | ICD-10-CM

## 2020-09-07 NOTE — Progress Notes (Deleted)
Location:  [place] Room Number: 152/D Place of Service:  SNF (31)   CODE STATUS: Full Code  Allergies  Allergen Reactions  . Cheese   . Penicillins Hives    Has patient had a PCN reaction causing immediate rash, facial/tongue/throat swelling, SOB or lightheadedness with hypotension: Unknown Has patient had a PCN reaction causing severe rash involving mucus membranes or skin necrosis: Unknown Has patient had a PCN reaction that required hospitalization: No Has patient had a PCN reaction occurring within the last 10 years: No If all of the above answers are "NO", then may proceed with Cephalosporin use.     Chief Complaint  Patient presents with  . Medical Management of Chronic Issues    Routine Visit of Medical Management   . Quality Metric Gaps    Discuss need for Eye Exam    HPI:    Past Medical History:  Diagnosis Date  . DM (diabetes mellitus) ([place])   . Hypertension   . Stroke [facility])     Past Surgical History:  Procedure Laterality Date  . CRANIECTOMY Right 04/01/2017   Procedure: RIGHT DECOMPRESSIVE CRANIECTOMY;  Surgeon: Ditty, Kevan Ny, MD;  Location: [place];  Service: Neurosurgery;  Laterality: Right;  . ESOPHAGOGASTRODUODENOSCOPY N/A 04/13/2017   Procedure: ESOPHAGOGASTRODUODENOSCOPY (EGD);  Surgeon: Georganna Skeans, MD;  Location: [place];  Service: General;  Laterality: N/A;  bedside  . INCISION AND DRAINAGE PERIRECTAL ABSCESS N/A 09/28/2017   Procedure: IRRIGATION AND DEBRIDEMENT PERIANAL ABSCESS;  Surgeon: Virl Cagey, MD;  Location: [facility] ORS;  Service: General;  Laterality: N/A;  . PEG PLACEMENT N/A 04/13/2017   Procedure: PERCUTANEOUS ENDOSCOPIC GASTROSTOMY (PEG) PLACEMENT;  Surgeon: Georganna Skeans, MD;  Location: [facility] ENDOSCOPY;  Service: General;  Laterality: N/A;    Social History   Socioeconomic History  . Marital status: Unknown    Spouse name: Not on file  . Number of children: Not on file  . Years of  education: Not on file  . Highest education level: Not on file  Occupational History  . Not on file  Tobacco Use  . Smoking status: Former Smoker    Types: Cigarettes  . Smokeless tobacco: Never Used  . Tobacco comment: UTA  Vaping Use  . Vaping Use: Never used  Substance and Sexual Activity  . Alcohol use: No    Comment: UTA  . Drug use: No    Comment: UTA  . Sexual activity: Not Currently    Birth control/protection: None  Other Topics Concern  . Not on file  Social History Narrative  . Not on file   Social Determinants of Health   Financial Resource Strain: Not on file  Food Insecurity: Not on file  Transportation Needs: Not on file  Physical Activity: Not on file  Stress: Not on file  Social Connections: Not on file  Intimate Partner Violence: Not on file   Family History  Problem Relation Age of Onset  . Hypertension Father       VITAL SIGNS BP 110/60   Pulse 67   Temp 97.8 F (36.6 C)   Ht 5\' 6"  (1.676 m)   Wt 170 lb 4.8 oz (77.2 kg)   BMI 27.49 kg/m   Outpatient Encounter Medications as of 09/07/2020  Medication Sig  . acetaminophen (TYLENOL) 325 MG tablet Take 650 mg by mouth every 4 (four) hours as needed. Not to exceed 3 grams in 24 hours  . apixaban (ELIQUIS) 5 MG TABS tablet Take 5 mg  by mouth 2 (two) times daily.   Marland Kitchen atorvastatin (LIPITOR) 80 MG tablet Take 80 mg by mouth daily.  . Baclofen 5 MG TABS Take 5 mg by mouth 3 (three) times daily.  . bisacodyl (DULCOLAX) 10 MG suppository Place 1 suppository (10 mg total) rectally daily as needed for moderate constipation.  . Cholecalciferol 1.25 MG (50000 UT) capsule Take 50,000 Units by mouth once a week. For Low Vitamin D Level on Monday  . coal tar (NEUTROGENA T-GEL) 0.5 % shampoo Apply 1 application topically See admin instructions. Apply to scalp on shower days twice a week Wed And Sat prn  . Dextromethorphan-guaiFENesin (ROBITUSSIN COUGH+CHEST CONG DM PO) Take 10 mLs by mouth every 6 (six) hours  as needed.  . docusate (COLACE) 50 MG/5ML liquid Give 10 ml by mouth twice a day  . gabapentin (NEURONTIN) 100 MG capsule Take 200 mg by mouth at bedtime.   . levETIRAcetam (KEPPRA) 100 MG/ML solution Take 500 mg by mouth 2 (two) times daily. For Seizures Mix in Grape Juice  . LORazepam (ATIVAN) 2 MG/ML injection Inject 0.5 mLs (1 mg total) into the muscle every 15 (fifteen) minutes as needed. Up to 3 doses for uncontrolled seizure activity  . Menthol, Topical Analgesic, (BIOFREEZE) 4 % GEL Apply 1 application topically 2 (two) times daily. Apply to left shoulder  . metoprolol tartrate (LOPRESSOR) 25 MG tablet Take 12.5 mg by mouth 2 (two) times daily.  . NON FORMULARY Regular Diet  . omeprazole (PRILOSEC) 40 MG capsule Take 40 mg by mouth daily.   . [DISCONTINUED] senna (SENOKOT) 8.6 MG TABS tablet Take 2 tablets by mouth 2 (two) times daily.    No facility-administered encounter medications on file as of 09/07/2020.     SIGNIFICANT DIAGNOSTIC EXAMS       ASSESSMENT/ PLAN:    MD is aware of resident's narcotic use and is in agreement with current plan of care. We will attempt to wean resident as appropriate.  Ok Edwards NP [facility]  Contact [phone] Monday through Friday 8am- 5pm  After hours call [phone]

## 2020-09-07 NOTE — Progress Notes (Signed)
Location:  [place] Room Number: 152/D Place of Service:  SNF (31)   CODE STATUS: Full Code  Allergies  Allergen Reactions  . Cheese   . Penicillins Hives    Has patient had a PCN reaction causing immediate rash, facial/tongue/throat swelling, SOB or lightheadedness with hypotension: Unknown Has patient had a PCN reaction causing severe rash involving mucus membranes or skin necrosis: Unknown Has patient had a PCN reaction that required hospitalization: No Has patient had a PCN reaction occurring within the last 10 years: No If all of the above answers are "NO", then may proceed with Cephalosporin use.     Chief Complaint  Patient presents with  . Medical Management of Chronic Issues           Chronic constipation:   Cerebrovascular accident (CVA) due to embolism of right middle cerebral artery   Vit D deficiency:    HPI:  He is a 60 year old long term resident of this facility being seen for the management of his chronic illnesses: Chronic constipation:   Cerebrovascular accident (CVA) due to embolism of right middle cerebral artery   Vit D deficiency. There are no reports of uncontrolled pain; no constipation; no anxiety or agitation.   Past Medical History:  Diagnosis Date  . DM (diabetes mellitus) ([place])   . Hypertension   . Stroke [facility])     Past Surgical History:  Procedure Laterality Date  . CRANIECTOMY Right 04/01/2017   Procedure: RIGHT DECOMPRESSIVE CRANIECTOMY;  Surgeon: Ditty, Kevan Ny, MD;  Location: [place];  Service: Neurosurgery;  Laterality: Right;  . ESOPHAGOGASTRODUODENOSCOPY N/A 04/13/2017   Procedure: ESOPHAGOGASTRODUODENOSCOPY (EGD);  Surgeon: Georganna Skeans, MD;  Location: [place];  Service: General;  Laterality: N/A;  bedside  . INCISION AND DRAINAGE PERIRECTAL ABSCESS N/A 09/28/2017   Procedure: IRRIGATION AND DEBRIDEMENT PERIANAL ABSCESS;  Surgeon: Virl Cagey, MD;  Location: [facility];  Service: General;   Laterality: N/A;  . PEG PLACEMENT N/A 04/13/2017   Procedure: PERCUTANEOUS ENDOSCOPIC GASTROSTOMY (PEG) PLACEMENT;  Surgeon: Georganna Skeans, MD;  Location: [facility] ENDOSCOPY;  Service: General;  Laterality: N/A;    Social History   Socioeconomic History  . Marital status: Unknown    Spouse name: Not on file  . Number of children: Not on file  . Years of education: Not on file  . Highest education level: Not on file  Occupational History  . Not on file  Tobacco Use  . Smoking status: Former Smoker    Types: Cigarettes  . Smokeless tobacco: Never Used  . Tobacco comment: UTA  Vaping Use  . Vaping Use: Never used  Substance and Sexual Activity  . Alcohol use: No    Comment: UTA  . Drug use: No    Comment: UTA  . Sexual activity: Not Currently    Birth control/protection: None  Other Topics Concern  . Not on file  Social History Narrative  . Not on file   Social Determinants of Health   Financial Resource Strain: Not on file  Food Insecurity: Not on file  Transportation Needs: Not on file  Physical Activity: Not on file  Stress: Not on file  Social Connections: Not on file  Intimate Partner Violence: Not on file   Family History  Problem Relation Age of Onset  . Hypertension Father       VITAL SIGNS BP 110/60   Pulse 67   Temp 97.8 F (36.6 C)   Ht 5\' 6"  (1.676 m)  Wt 170 lb 4.8 oz (77.2 kg)   BMI 27.49 kg/m   Outpatient Encounter Medications as of 09/07/2020  Medication Sig  . acetaminophen (TYLENOL) 325 MG tablet Take 650 mg by mouth every 4 (four) hours as needed. Not to exceed 3 grams in 24 hours  . apixaban (ELIQUIS) 5 MG TABS tablet Take 5 mg by mouth 2 (two) times daily.   Marland Kitchen atorvastatin (LIPITOR) 80 MG tablet Take 80 mg by mouth daily.  . Baclofen 5 MG TABS Take 5 mg by mouth 3 (three) times daily.  . bisacodyl (DULCOLAX) 10 MG suppository Place 1 suppository (10 mg total) rectally daily as needed for moderate constipation.  . Cholecalciferol 1.25 MG  (50000 UT) capsule Take 50,000 Units by mouth once a week. For Low Vitamin D Level on Monday  . coal tar (NEUTROGENA T-GEL) 0.5 % shampoo Apply 1 application topically See admin instructions. Apply to scalp on shower days twice a week Wed And Sat prn  . Dextromethorphan-guaiFENesin (ROBITUSSIN COUGH+CHEST CONG DM PO) Take 10 mLs by mouth every 6 (six) hours as needed.  . docusate (COLACE) 50 MG/5ML liquid Give 10 ml by mouth twice a day  . gabapentin (NEURONTIN) 100 MG capsule Take 200 mg by mouth at bedtime.   . levETIRAcetam (KEPPRA) 100 MG/ML solution Take 500 mg by mouth 2 (two) times daily. For Seizures Mix in Grape Juice  . LORazepam (ATIVAN) 2 MG/ML injection Inject 0.5 mLs (1 mg total) into the muscle every 15 (fifteen) minutes as needed. Up to 3 doses for uncontrolled seizure activity  . Menthol, Topical Analgesic, (BIOFREEZE) 4 % GEL Apply 1 application topically 2 (two) times daily. Apply to left shoulder  . metoprolol tartrate (LOPRESSOR) 25 MG tablet Take 12.5 mg by mouth 2 (two) times daily.  . NON FORMULARY Regular Diet  . omeprazole (PRILOSEC) 40 MG capsule Take 40 mg by mouth daily.   . [DISCONTINUED] senna (SENOKOT) 8.6 MG TABS tablet Take 2 tablets by mouth 2 (two) times daily.    No facility-administered encounter medications on file as of 09/07/2020.     SIGNIFICANT DIAGNOSTIC EXAMS  PREVIOUS:   08-24-18: ct of head: 1. 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 Increased volume loss of the infarcted brain as well as decreased herniation of brain and dura through the craniectomy defect. 2. No new intracranial abnormality.  NO NEW EXAMS.   LABS REVIEWED PREVIOUS:   07-29-19: : glucose 97; bun 11; creat 0.85; k+ 4.1; na++ 140; ca 9.2 liver normal albumin 3.2 hgb a1c 4.6 08-21-19: vit D 24.16  11-07-19: vit D 46.75 12-05-19: wbc 10.5; hgb 14.2; hct 47.5; mcv 94.1 plt 158; glucose 84; bun 11; creat 0.80 k+ 4.1; na++ 140; ca 9.2 liver normal  albumin 3.1 hgb a1c 4.9 chol 132; ldl 77; trig 110; hdl 33  01-09-20: vit D 43.63 02-06-20: urine micro-albumin: 27.4  03-13-20: glucose 116; bun 12; creat 0.67; k+ 3.8; na++ 136; ca 8.8 liver normal albumin 3.1 vit D 29.72 03-28-20: wbc 15.5; hgb 15.6; hct 51.7; mcv 93.5 plt clump; glucose 153; bun 16; creat 0.99; k+ 4.4; na++ 137; ca 9.6 tsh 0.811; hgb a1c 4.9;  03-29-20: chol 143 ldl 96 trig 83 hdl 30 HIV nr  04-06-20: wbc 10.9; hgb 12.7; hct 42.2; mcv 95.5 plt 171; glucose 91; bun 11; creat 0.76; k+ 4.7; na++ 140; ca 9.0 liver normal albumin 2.9 depakote 34  04-16-20: depakote 56   08-06-20: Vit D  40.07  NO NEW LABS.     Review of Systems  Constitutional: Negative for malaise/fatigue.  Respiratory: Negative for cough and shortness of breath.   Cardiovascular: Negative for chest pain, palpitations and leg swelling.  Gastrointestinal: Negative for abdominal pain, constipation and heartburn.  Musculoskeletal: Negative for back pain, joint pain and myalgias.  Skin: Negative.   Neurological: Negative for dizziness.  Psychiatric/Behavioral: The patient is not nervous/anxious.     Physical Exam Constitutional:      General: He is not in acute distress.    Appearance: He is well-developed and well-nourished. He is not diaphoretic.  Neck:     Thyroid: No thyromegaly.  Cardiovascular:     Rate and Rhythm: Normal rate and regular rhythm.     Pulses: Normal pulses and intact distal pulses.     Heart sounds: Normal heart sounds.  Pulmonary:     Effort: Pulmonary effort is normal. No respiratory distress.     Breath sounds: Normal breath sounds.  Abdominal:     General: Bowel sounds are normal. There is no distension.     Palpations: Abdomen is soft.     Tenderness: There is no abdominal tenderness.  Musculoskeletal:        General: No edema.     Cervical back: Neck supple.     Right lower leg: No edema.     Left lower leg: No edema.     Comments: Left hemiplegia with stiffness          Lymphadenopathy:     Cervical: No cervical adenopathy.  Skin:    General: Skin is warm and dry.  Neurological:     Mental Status: He is alert. Mental status is at baseline.  Psychiatric:        Mood and Affect: Mood and affect and mood normal.      ASSESSMENT/ PLAN:  TODAY;   1. Chronic constipation: is stable will continue senna 2 tabs twice daily   2. Cerebrovascular accident (CVA) due to embolism of right middle cerebral artery is status post compression right frontotemporopartietal craniotomy with duraplasty 1918 is stable will continue eliquis 5 mg twice daily   3. Vit D deficiency: is stable level 40.07 will continue 50,000 units weekly   PREVIOUS   4. 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 GERD without esophagitis: is stable will continue prilosec 40 mg daily   6. Dyslipidemia associated with type 2 diabetes mellitus is stable LDL 96 will continue lipitor 80 mg daily   7. AF (paroxsymal atrial fibrillation) heart rate is stable will continue lopressor 12.5 mg twice daily for rate control and is on eliquis 5 mg twice daily   8. Hypertension associated with type 2 diabetes mellitus: is stable b/p 124/53 will continue lopressor 12.5 mg twice daily   9. Type 2 diabetes mellitus with neurological complications: is stable hgb a1c 4.9 diet controlled will monitor   10. Diabetic peripheral neuropathy: is stable will continue gabapentin 200 mg nightly   11. Seizures: is stable no recent seizure activity; will continue keppra 500 mg twice daily will at times decline medications.         MD is aware of resident's narcotic use and is in agreement with current plan of care. We will attempt to wean resident as appropriate.  Ok Edwards NP [facility]  Contact [phone] Monday  through Friday 8am-  5pm  After hours call [phone]

## 2020-09-10 ENCOUNTER — Other Ambulatory Visit (HOSPITAL_COMMUNITY)
Admission: RE | Admit: 2020-09-10 | Discharge: 2020-09-10 | Disposition: A | Discharge status: Home / Self Care | Payer: Medicaid Other | Source: Skilled Nursing Facility | Attending: Adult Health | Admitting: Adult Health

## 2020-09-10 DIAGNOSIS — E1140 Type 2 diabetes mellitus with diabetic neuropathy, unspecified: Secondary | ICD-10-CM | POA: Diagnosis present

## 2020-09-10 LAB — HEMOGLOBIN A1C
Hgb A1c MFr Bld: 7.5 % — ABNORMAL HIGH (ref 4.8–5.6)
Mean Plasma Glucose: 168.55 mg/dL

## 2020-09-10 LAB — LIPID PANEL
Cholesterol: 115 mg/dL (ref 0–200)
HDL: 31 mg/dL — ABNORMAL LOW (ref >40)
LDL Cholesterol: 62 mg/dL (ref 0–99)
Total CHOL/HDL Ratio: 3.7 RATIO
Triglycerides: 108 mg/dL (ref <150)
VLDL: 22 mg/dL (ref 0–40)

## 2020-09-10 LAB — COMPREHENSIVE METABOLIC PANEL
ALT: 14 U/L (ref 0–44)
AST: 12 U/L — ABNORMAL LOW (ref 15–41)
Albumin: 2.7 g/dL — ABNORMAL LOW (ref 3.5–5.0)
Alkaline Phosphatase: 73 U/L (ref 38–126)
Anion gap: 4 — ABNORMAL LOW (ref 5–15)
BUN: 8 mg/dL (ref 6–20)
CO2: 25 mmol/L (ref 22–32)
Calcium: 9 mg/dL (ref 8.9–10.3)
Chloride: 109 mmol/L (ref 98–111)
Creatinine, Ser: 0.6 mg/dL — ABNORMAL LOW (ref 0.61–1.24)
GFR, Estimated: >60 mL/min (ref >60)
Glucose, Bld: 163 mg/dL — ABNORMAL HIGH (ref 70–99)
Potassium: 3.7 mmol/L (ref 3.5–5.1)
Sodium: 138 mmol/L (ref 135–145)
Total Bilirubin: 0.8 mg/dL (ref 0.3–1.2)
Total Protein: 6.4 g/dL — ABNORMAL LOW (ref 6.5–8.1)

## 2020-09-10 LAB — TSH: TSH: 1.233 u[IU]/mL (ref 0.350–4.500)

## 2020-09-15 ENCOUNTER — Non-Acute Institutional Stay (SKILLED_NURSING_FACILITY): Payer: Medicaid Other | Admitting: Adult Health

## 2020-09-15 ENCOUNTER — Encounter: Payer: Self-pay | Admitting: Adult Health

## 2020-09-15 DIAGNOSIS — E1149 Type 2 diabetes mellitus with other diabetic neurological complication: Secondary | ICD-10-CM | POA: Diagnosis not present

## 2020-09-15 DIAGNOSIS — I152 Hypertension secondary to endocrine disorders: Secondary | ICD-10-CM

## 2020-09-15 DIAGNOSIS — E785 Hyperlipidemia, unspecified: Secondary | ICD-10-CM

## 2020-09-15 DIAGNOSIS — E1169 Type 2 diabetes mellitus with other specified complication: Secondary | ICD-10-CM | POA: Diagnosis not present

## 2020-09-15 DIAGNOSIS — E1159 Type 2 diabetes mellitus with other circulatory complications: Secondary | ICD-10-CM | POA: Diagnosis not present

## 2020-09-15 NOTE — Progress Notes (Signed)
Location:  [place] Room Number: 152-D Place of Service:  SNF (31)   CODE STATUS: Full Code   Allergies  Allergen Reactions  . Cheese   . Penicillins Hives    Has patient had a PCN reaction causing immediate rash, facial/tongue/throat swelling, SOB or lightheadedness with hypotension: Unknown Has patient had a PCN reaction causing severe rash involving mucus membranes or skin necrosis: Unknown Has patient had a PCN reaction that required hospitalization: No Has patient had a PCN reaction occurring within the last 10 years: No If all of the above answers are "NO", then may proceed with Cephalosporin use.     Chief Complaint  Patient presents with  . Acute Visit    Diabetic concerns     HPI:  His diabetes has been diet controlled with hgb a1c <5.  His a1c is presently 7.5. he is presently not taking medications of his diabetes. He tells me that he has been eating a lot of sweets over the past couple of months. He denies any excessive thirst or hunger.   Past Medical History:  Diagnosis Date  . DM (diabetes mellitus) ([place])   . Hypertension   . Stroke [facility])     Past Surgical History:  Procedure Laterality Date  . CRANIECTOMY Right 04/01/2017   Procedure: RIGHT DECOMPRESSIVE CRANIECTOMY;  Surgeon: Ditty, Kevan Ny, MD;  Location: [place];  Service: Neurosurgery;  Laterality: Right;  . ESOPHAGOGASTRODUODENOSCOPY N/A 04/13/2017   Procedure: ESOPHAGOGASTRODUODENOSCOPY (EGD);  Surgeon: Georganna Skeans, MD;  Location: [place];  Service: General;  Laterality: N/A;  bedside  . INCISION AND DRAINAGE PERIRECTAL ABSCESS N/A 09/28/2017   Procedure: IRRIGATION AND DEBRIDEMENT PERIANAL ABSCESS;  Surgeon: Virl Cagey, MD;  Location: [facility];  Service: General;  Laterality: N/A;  . PEG PLACEMENT N/A 04/13/2017   Procedure: PERCUTANEOUS ENDOSCOPIC GASTROSTOMY (PEG) PLACEMENT;  Surgeon: Georganna Skeans, MD;  Location: [facility] ENDOSCOPY;  Service: General;   Laterality: N/A;    Social History   Socioeconomic History  . Marital status: Unknown    Spouse name: Not on file  . Number of children: Not on file  . Years of education: Not on file  . Highest education level: Not on file  Occupational History  . Not on file  Tobacco Use  . Smoking status: Former Smoker    Types: Cigarettes  . Smokeless tobacco: Never Used  . Tobacco comment: UTA  Vaping Use  . Vaping Use: Never used  Substance and Sexual Activity  . Alcohol use: No    Comment: UTA  . Drug use: No    Comment: UTA  . Sexual activity: Not Currently    Birth control/protection: None  Other Topics Concern  . Not on file  Social History Narrative  . Not on file   Social Determinants of Health   Financial Resource Strain: Not on file  Food Insecurity: Not on file  Transportation Needs: Not on file  Physical Activity: Not on file  Stress: Not on file  Social Connections: Not on file  Intimate Partner Violence: Not on file   Family History  Problem Relation Age of Onset  . Hypertension Father       VITAL SIGNS BP 120/70   Pulse 74   Temp 97.8 F (36.6 C)   Resp 20   Ht 5\' 6"  (1.676 m)   Wt 172 lb (78 kg)   SpO2 95%   BMI 27.76 kg/m   Outpatient Encounter Medications as of 09/15/2020  Medication  Sig  . acetaminophen (TYLENOL) 325 MG tablet Take 650 mg by mouth every 4 (four) hours as needed. Not to exceed 3 grams in 24 hours  . apixaban (ELIQUIS) 5 MG TABS tablet Take 5 mg by mouth 2 (two) times daily.   Marland Kitchen atorvastatin (LIPITOR) 80 MG tablet Take 80 mg by mouth daily.  . Baclofen 5 MG TABS Take 5 mg by mouth 3 (three) times daily.  . bisacodyl (DULCOLAX) 10 MG suppository Place 1 suppository (10 mg total) rectally daily as needed for moderate constipation.  . Cholecalciferol 1.25 MG (50000 UT) capsule Take 50,000 Units by mouth once a week. For Low Vitamin D Level on Monday  . coal tar (NEUTROGENA T-GEL) 0.5 % shampoo Apply 1 application topically See admin  instructions. Apply to scalp on shower days twice a week Wed And Sat prn  . Dextromethorphan-guaiFENesin (ROBITUSSIN COUGH+CHEST CONG DM PO) Take 10 mLs by mouth every 6 (six) hours as needed.  . docusate (COLACE) 50 MG/5ML liquid Give 10 ml by mouth twice a day  . gabapentin (NEURONTIN) 100 MG capsule Take 200 mg by mouth at bedtime.   . levETIRAcetam (KEPPRA) 100 MG/ML solution Take 500 mg by mouth 2 (two) times daily. For Seizures Mix in Grape Juice  . LORazepam (ATIVAN) 2 MG/ML injection Inject 0.5 mLs (1 mg total) into the muscle every 15 (fifteen) minutes as needed. Up to 3 doses for uncontrolled seizure activity  . Menthol, Topical Analgesic, (BIOFREEZE) 4 % GEL Apply 1 application topically 2 (two) times daily. Apply to left shoulder  . metoprolol tartrate (LOPRESSOR) 25 MG tablet Take 12.5 mg by mouth 2 (two) times daily.  . NON FORMULARY Regular Diet  . omeprazole (PRILOSEC) 40 MG capsule Take 40 mg by mouth daily.    No facility-administered encounter medications on file as of 09/15/2020.     SIGNIFICANT DIAGNOSTIC EXAMS   PREVIOUS:   08-24-18: ct of head: 1. 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 Increased volume loss of the infarcted brain as well as decreased herniation of brain and dura through the craniectomy defect. 2. No new intracranial abnormality.  NO NEW EXAMS.   LABS REVIEWED PREVIOUS:   07-29-19: : glucose 97; bun 11; creat 0.85; k+ 4.1; na++ 140; ca 9.2 liver normal albumin 3.2 hgb a1c 4.6 08-21-19: vit D 24.16  11-07-19: vit D 46.75 12-05-19: wbc 10.5; hgb 14.2; hct 47.5; mcv 94.1 plt 158; glucose 84; bun 11; creat 0.80 k+ 4.1; na++ 140; ca 9.2 liver normal albumin 3.1 hgb a1c 4.9 chol 132; ldl 77; trig 110; hdl 33  01-09-20: vit D 43.63 02-06-20: urine micro-albumin: 27.4  03-13-20: glucose 116; bun 12; creat 0.67; k+ 3.8; na++ 136; ca 8.8 liver normal albumin 3.1 vit D 29.72 03-28-20: wbc 15.5; hgb 15.6; hct 51.7; mcv 93.5 plt  clump; glucose 153; bun 16; creat 0.99; k+ 4.4; na++ 137; ca 9.6 tsh 0.811; hgb a1c 4.9;  03-29-20: chol 143 ldl 96 trig 83 hdl 30 HIV nr  04-06-20: wbc 10.9; hgb 12.7; hct 42.2; mcv 95.5 plt 171; glucose 91; bun 11; creat 0.76; k+ 4.7; na++ 140; ca 9.0 liver normal albumin 2.9 depakote 34  04-16-20: depakote 56   08-06-20: Vit D  40.07  TODAY  09-10-20: glucose 163; bun 8; creat 0.60 ;k+ 3.7; na++ 138; ca 9.0; GFR >60; liver normal albumin 2.7; hgb a1c 7.5; chol 115; ldl 62; trig 108; hdl 31; tsh 1.233    Review of Systems  Constitutional: Negative for malaise/fatigue.  Respiratory: Negative for cough and shortness of breath.   Cardiovascular: Negative for chest pain, palpitations and leg swelling.  Gastrointestinal: Negative for abdominal pain, constipation and heartburn.  Musculoskeletal: Negative for back pain, joint pain and myalgias.  Skin: Negative.   Neurological: Negative for dizziness.  Psychiatric/Behavioral: The patient is not nervous/anxious.       Physical Exam Constitutional:      General: He is not in acute distress.    Appearance: He is well-developed and well-nourished. He is not diaphoretic.  Neck:     Thyroid: No thyromegaly.  Cardiovascular:     Rate and Rhythm: Normal rate and regular rhythm.     Pulses: Normal pulses and intact distal pulses.     Heart sounds: Normal heart sounds.  Pulmonary:     Effort: Pulmonary effort is normal. No respiratory distress.     Breath sounds: Normal breath sounds.  Abdominal:     General: Bowel sounds are normal. There is no distension.     Palpations: Abdomen is soft.     Tenderness: There is no abdominal tenderness.  Musculoskeletal:        General: No edema.     Cervical back: Neck supple.     Right lower leg: No edema.     Left lower leg: No edema.     Comments: Left hemiplegia with stiffness          Lymphadenopathy:     Cervical: No cervical adenopathy.  Skin:    General: Skin is warm and dry.  Neurological:      Mental Status: He is alert. Mental status is at baseline.  Psychiatric:        Mood and Affect: Mood and affect and mood normal.      ASSESSMENT/ PLAN:  TODAY;   1. Type 2 diabetes mellitus with neurological complications: is worse; hgb a1c 7.5 will begin januvia 50 mg daily and will check cbg daily will monitor his status  2. Hypertension associated with type 2 diabetes mellitus is stable b/p 120/70 will continue lopressor 12.5 mg twice daily   3.  Dyslipidemia associated with type 2 diabetes mellitus: is stable LDL 62 will continue lipitor 80 mg daily      MD is aware of resident's narcotic use and is in agreement with current plan of care. We will attempt to wean resident as appropriate.  Ok Edwards NP [facility]  Contact [phone] Monday through Friday 8am- 5pm  After hours call [phone]

## 2020-09-17 ENCOUNTER — Encounter: Payer: Self-pay | Admitting: Adult Health

## 2020-09-17 ENCOUNTER — Non-Acute Institutional Stay (SKILLED_NURSING_FACILITY): Payer: Medicaid Other | Admitting: Adult Health

## 2020-09-17 DIAGNOSIS — I63411 Cerebral infarction due to embolism of right middle cerebral artery: Secondary | ICD-10-CM | POA: Diagnosis not present

## 2020-09-17 DIAGNOSIS — I480 Paroxysmal atrial fibrillation: Secondary | ICD-10-CM | POA: Diagnosis not present

## 2020-09-17 DIAGNOSIS — I69354 Hemiplegia and hemiparesis following cerebral infarction affecting left non-dominant side: Secondary | ICD-10-CM | POA: Diagnosis not present

## 2020-09-17 NOTE — Progress Notes (Signed)
Location:  [place] Room Number: 152/D Place of Service:  SNF (31)   CODE STATUS: Full Code  Allergies  Allergen Reactions  . Cheese   . Penicillins Hives    Has patient had a PCN reaction causing immediate rash, facial/tongue/throat swelling, SOB or lightheadedness with hypotension: Unknown Has patient had a PCN reaction causing severe rash involving mucus membranes or skin necrosis: Unknown Has patient had a PCN reaction that required hospitalization: No Has patient had a PCN reaction occurring within the last 10 years: No If all of the above answers are "NO", then may proceed with Cephalosporin use.     Chief Complaint  Patient presents with  . Acute Visit    Care Plan Meeting     HPI:  We have come together for his care plan meeting. BIMS 11/30; mood 8/30.   He requires extensive to dependent assist with his adls. He is able to feed himself after set up. He is incontinent of bladder and bowel. There have been no falls. He has been started on [place] for his diabetes. There are no reports of uncontrolled pain. He has a 10 pound weight loss over this past quarter weight is 172 pounds.  He continues to be followed for his chronic illnesses including: AF (paroxysmal atrial fibrillation)  Cerebrovascular accident (CVA) due to embolism of right middle cerebral artery  Hemiparesis affecting left side as late effect of cerebrovascular accident  Past Medical History:  Diagnosis Date  . DM (diabetes mellitus) ([place])   . Hypertension   . Stroke [facility])     Past Surgical History:  Procedure Laterality Date  . CRANIECTOMY Right 04/01/2017   Procedure: RIGHT DECOMPRESSIVE CRANIECTOMY;  Surgeon: Ditty, Kevan Ny, MD;  Location: [place];  Service: Neurosurgery;  Laterality: Right;  . ESOPHAGOGASTRODUODENOSCOPY N/A 04/13/2017   Procedure: ESOPHAGOGASTRODUODENOSCOPY (EGD);  Surgeon: Georganna Skeans, MD;  Location: [place];  Service: General;  Laterality: N/A;   bedside  . INCISION AND DRAINAGE PERIRECTAL ABSCESS N/A 09/28/2017   Procedure: IRRIGATION AND DEBRIDEMENT PERIANAL ABSCESS;  Surgeon: Virl Cagey, MD;  Location: [facility];  Service: General;  Laterality: N/A;  . PEG PLACEMENT N/A 04/13/2017   Procedure: PERCUTANEOUS ENDOSCOPIC GASTROSTOMY (PEG) PLACEMENT;  Surgeon: Georganna Skeans, MD;  Location: [facility] ENDOSCOPY;  Service: General;  Laterality: N/A;    Social History   Socioeconomic History  . Marital status: Unknown    Spouse name: Not on file  . Number of children: Not on file  . Years of education: Not on file  . Highest education level: Not on file  Occupational History  . Not on file  Tobacco Use  . Smoking status: Former Smoker    Types: Cigarettes  . Smokeless tobacco: Never Used  . Tobacco comment: UTA  Vaping Use  . Vaping Use: Never used  Substance and Sexual Activity  . Alcohol use: No    Comment: UTA  . Drug use: No    Comment: UTA  . Sexual activity: Not Currently    Birth control/protection: None  Other Topics Concern  . Not on file  Social History Narrative  . Not on file   Social Determinants of Health   Financial Resource Strain: Not on file  Food Insecurity: Not on file  Transportation Needs: Not on file  Physical Activity: Not on file  Stress: Not on file  Social Connections: Not on file  Intimate Partner Violence: Not on file   Family History  Problem Relation Age of  Onset  . Hypertension Father       VITAL SIGNS BP 120/70   Pulse 74   Temp 97.6 F (36.4 C)   Ht 5\' 6"  (1.676 m)   Wt 172 lb (78 kg)   BMI 27.76 kg/m   Outpatient Encounter Medications as of 09/17/2020  Medication Sig  . acetaminophen (TYLENOL) 325 MG tablet Take 650 mg by mouth every 4 (four) hours as needed. Not to exceed 3 grams in 24 hours  . apixaban (ELIQUIS) 5 MG TABS tablet Take 5 mg by mouth 2 (two) times daily.   Marland Kitchen atorvastatin (LIPITOR) 80 MG tablet Take 80 mg by mouth daily.  . Baclofen 5 MG TABS Take 5 mg  by mouth 3 (three) times daily.  . bisacodyl (DULCOLAX) 10 MG suppository Place 1 suppository (10 mg total) rectally daily as needed for moderate constipation.  . Cholecalciferol 1.25 MG (50000 UT) capsule Take 50,000 Units by mouth once a week. For Low Vitamin D Level on Monday  . coal tar (NEUTROGENA T-GEL) 0.5 % shampoo Apply 1 application topically See admin instructions. Apply to scalp on shower days twice a week Wed And Sat prn  . Dextromethorphan-guaiFENesin (ROBITUSSIN COUGH+CHEST CONG DM PO) Take 10 mLs by mouth every 6 (six) hours as needed.  . docusate (COLACE) 50 MG/5ML liquid Give 10 ml by mouth twice a day  . gabapentin (NEURONTIN) 100 MG capsule Take 200 mg by mouth at bedtime.   . levETIRAcetam (KEPPRA) 100 MG/ML solution Take 500 mg by mouth 2 (two) times daily. For Seizures Mix in Grape Juice  . LORazepam (ATIVAN) 2 MG/ML injection Inject 0.5 mLs (1 mg total) into the muscle every 15 (fifteen) minutes as needed. Up to 3 doses for uncontrolled seizure activity  . Menthol, Topical Analgesic, (BIOFREEZE) 4 % GEL Apply 1 application topically 2 (two) times daily. Apply to left shoulder  . metoprolol tartrate (LOPRESSOR) 25 MG tablet Take 12.5 mg by mouth 2 (two) times daily.  . NON FORMULARY Regular Diet  . omeprazole (PRILOSEC) 40 MG capsule Take 40 mg by mouth daily.   . sitaGLIPtin (JANUVIA) 50 MG tablet Take 50 mg by mouth daily.   No facility-administered encounter medications on file as of 09/17/2020.     SIGNIFICANT DIAGNOSTIC EXAMS   PREVIOUS:   08-24-18: ct of head: 1. 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 Increased volume loss of the infarcted brain as well as decreased herniation of brain and dura through the craniectomy defect. 2. No new intracranial abnormality.  NO NEW EXAMS.   LABS REVIEWED PREVIOUS:   11-07-19: vit D 46.75 12-05-19: wbc 10.5; hgb 14.2; hct 47.5; mcv 94.1 plt 158; glucose 84; bun 11; creat 0.80 k+ 4.1; na++  140; ca 9.2 liver normal albumin 3.1 hgb a1c 4.9 chol 132; ldl 77; trig 110; hdl 33  01-09-20: vit D 43.63 02-06-20: urine micro-albumin: 27.4  03-13-20: glucose 116; bun 12; creat 0.67; k+ 3.8; na++ 136; ca 8.8 liver normal albumin 3.1 vit D 29.72 03-28-20: wbc 15.5; hgb 15.6; hct 51.7; mcv 93.5 plt clump; glucose 153; bun 16; creat 0.99; k+ 4.4; na++ 137; ca 9.6 tsh 0.811; hgb a1c 4.9;  03-29-20: chol 143 ldl 96 trig 83 hdl 30 HIV nr  04-06-20: wbc 10.9; hgb 12.7; hct 42.2; mcv 95.5 plt 171; glucose 91; bun 11; creat 0.76; k+ 4.7; na++ 140; ca 9.0 liver normal albumin 2.9 depakote 34  04-16-20: depakote 56   08-06-20: Vit D  40.07 09-10-20: glucose 163; bun 8; creat 0.60 ;k+ 3.7; na++ 138; ca 9.0; GFR >60; liver normal albumin 2.7; hgb a1c 7.5; chol 115; ldl 62; trig 108; hdl 31; tsh 1.233   NO NEW LABS   Review of Systems  Constitutional: Negative for malaise/fatigue.  Respiratory: Negative for cough and shortness of breath.   Cardiovascular: Negative for chest pain, palpitations and leg swelling.  Gastrointestinal: Negative for abdominal pain, constipation and heartburn.  Musculoskeletal: Negative for back pain, joint pain and myalgias.  Skin: Negative.   Neurological: Negative for dizziness.  Psychiatric/Behavioral: The patient is not nervous/anxious.     Physical Exam Constitutional:      General: He is not in acute distress.    Appearance: He is well-developed and well-nourished. He is not diaphoretic.  Neck:     Thyroid: No thyromegaly.  Cardiovascular:     Rate and Rhythm: Normal rate and regular rhythm.     Pulses: Normal pulses and intact distal pulses.     Heart sounds: Normal heart sounds.  Pulmonary:     Effort: Pulmonary effort is normal. No respiratory distress.     Breath sounds: Normal breath sounds.  Abdominal:     General: Bowel sounds are normal. There is no distension.     Palpations: Abdomen is soft.     Tenderness: There is no abdominal tenderness.   Musculoskeletal:     Cervical back: Neck supple.     Right lower leg: No edema.     Left lower leg: No edema.     Comments: Left hemiplegia with stiffness           Lymphadenopathy:     Cervical: No cervical adenopathy.  Skin:    General: Skin is warm and dry.  Neurological:     Mental Status: He is alert. Mental status is at baseline.  Psychiatric:        Mood and Affect: Mood and affect and mood normal.        ASSESSMENT/ PLAN:  TODAY  1. AF (paroxysmal atrial fibrillation) 2. Cerebrovascular accident (CVA) due to embolism of right middle cerebral artery 3. Hemiparesis affecting left side as late effect of cerebrovascular accident  Will continue current plan of care Will continue current medications Will continue to monitor his status.    MD is aware of resident's narcotic use and is in agreement with current plan of care. We will attempt to wean resident as appropriate.  Ok Edwards NP [facility]  Contact [phone] Monday through Friday 8am- 5pm  After hours call [phone]

## 2020-10-02 ENCOUNTER — Non-Acute Institutional Stay (SKILLED_NURSING_FACILITY): Payer: Medicaid Other | Admitting: Adult Health

## 2020-10-02 DIAGNOSIS — K219 Gastro-esophageal reflux disease without esophagitis: Secondary | ICD-10-CM

## 2020-10-02 DIAGNOSIS — I480 Paroxysmal atrial fibrillation: Secondary | ICD-10-CM | POA: Diagnosis not present

## 2020-10-02 DIAGNOSIS — I69354 Hemiplegia and hemiparesis following cerebral infarction affecting left non-dominant side: Secondary | ICD-10-CM

## 2020-10-02 NOTE — Progress Notes (Signed)
Location:  [place] Room Number: 542 Place of Service:  SNF (31)   CODE STATUS: full   Allergies  Allergen Reactions  . Cheese   . Penicillins Hives    Has patient had a PCN reaction causing immediate rash, facial/tongue/throat swelling, SOB or lightheadedness with hypotension: Unknown Has patient had a PCN reaction causing severe rash involving mucus membranes or skin necrosis: Unknown Has patient had a PCN reaction that required hospitalization: No Has patient had a PCN reaction occurring within the last 10 years: No If all of the above answers are "NO", then may proceed with Cephalosporin use.     Chief Complaint  Patient presents with  . Medical Management of Chronic Issues         Hemiplegia affecting left side as late effect of cerebrovascular accident (CVA)   GERD without esophagitis:  AF (paroxsymal atrial fibrillation)     HPI:  He is a 60 year old long term resident of this facility being seen for the management of her chronic illnesses:  Hemiplegia affecting left side as late effect of cerebrovascular accident (CVA)   GERD without esophagitis:  AF (paroxsymal atrial fibrillation).  He denies any pain; he does spend most of his time in bed per his choice. He denies any anxiety; no heart burn.   Past Medical History:  Diagnosis Date  . DM (diabetes mellitus) ([place])   . Hypertension   . Stroke [facility])     Past Surgical History:  Procedure Laterality Date  . CRANIECTOMY Right 04/01/2017   Procedure: RIGHT DECOMPRESSIVE CRANIECTOMY;  Surgeon: Ditty, Kevan Ny, MD;  Location: [place];  Service: Neurosurgery;  Laterality: Right;  . ESOPHAGOGASTRODUODENOSCOPY N/A 04/13/2017   Procedure: ESOPHAGOGASTRODUODENOSCOPY (EGD);  Surgeon: Georganna Skeans, MD;  Location: [place];  Service: General;  Laterality: N/A;  bedside  . INCISION AND DRAINAGE PERIRECTAL ABSCESS N/A 09/28/2017   Procedure: IRRIGATION AND DEBRIDEMENT PERIANAL ABSCESS;  Surgeon:  Virl Cagey, MD;  Location: [facility];  Service: General;  Laterality: N/A;  . PEG PLACEMENT N/A 04/13/2017   Procedure: PERCUTANEOUS ENDOSCOPIC GASTROSTOMY (PEG) PLACEMENT;  Surgeon: Georganna Skeans, MD;  Location: [facility] ENDOSCOPY;  Service: General;  Laterality: N/A;    Social History   Socioeconomic History  . Marital status: Unknown    Spouse name: Not on file  . Number of children: Not on file  . Years of education: Not on file  . Highest education level: Not on file  Occupational History  . Not on file  Tobacco Use  . Smoking status: Former Smoker    Types: Cigarettes  . Smokeless tobacco: Never Used  . Tobacco comment: UTA  Vaping Use  . Vaping Use: Never used  Substance and Sexual Activity  . Alcohol use: No    Comment: UTA  . Drug use: No    Comment: UTA  . Sexual activity: Not Currently    Birth control/protection: None  Other Topics Concern  . Not on file  Social History Narrative  . Not on file   Social Determinants of Health   Financial Resource Strain: Not on file  Food Insecurity: Not on file  Transportation Needs: Not on file  Physical Activity: Not on file  Stress: Not on file  Social Connections: Not on file  Intimate Partner Violence: Not on file   Family History  Problem Relation Age of Onset  . Hypertension Father       VITAL SIGNS BP (!) 102/54   Pulse  75   Temp 97.6 F (36.4 C)   Ht 5\' 6"  (1.676 m)   Wt 172 lb (78 kg)   BMI 27.76 kg/m   Outpatient Encounter Medications as of 10/02/2020  Medication Sig  . acetaminophen (TYLENOL) 325 MG tablet Take 650 mg by mouth every 4 (four) hours as needed. Not to exceed 3 grams in 24 hours  . apixaban (ELIQUIS) 5 MG TABS tablet Take 5 mg by mouth 2 (two) times daily.   Marland Kitchen atorvastatin (LIPITOR) 80 MG tablet Take 80 mg by mouth daily.  . Baclofen 5 MG TABS Take 5 mg by mouth 3 (three) times daily.  . bisacodyl (DULCOLAX) 10 MG suppository Place 1 suppository (10 mg total) rectally daily as  needed for moderate constipation.  . Cholecalciferol 1.25 MG (50000 UT) capsule Take 50,000 Units by mouth once a week. For Low Vitamin D Level on Monday  . coal tar (NEUTROGENA T-GEL) 0.5 % shampoo Apply 1 application topically See admin instructions. Apply to scalp on shower days twice a week Wed And Sat prn  . Dextromethorphan-guaiFENesin (ROBITUSSIN COUGH+CHEST CONG DM PO) Take 10 mLs by mouth every 6 (six) hours as needed.  . docusate (COLACE) 50 MG/5ML liquid Give 10 ml by mouth twice a day  . gabapentin (NEURONTIN) 100 MG capsule Take 200 mg by mouth at bedtime.   . levETIRAcetam (KEPPRA) 100 MG/ML solution Take 500 mg by mouth 2 (two) times daily. For Seizures Mix in Grape Juice  . LORazepam (ATIVAN) 2 MG/ML injection Inject 0.5 mLs (1 mg total) into the muscle every 15 (fifteen) minutes as needed. Up to 3 doses for uncontrolled seizure activity  . Menthol, Topical Analgesic, (BIOFREEZE) 4 % GEL Apply 1 application topically 2 (two) times daily. Apply to left shoulder  . metoprolol tartrate (LOPRESSOR) 25 MG tablet Take 12.5 mg by mouth 2 (two) times daily.  . NON FORMULARY Regular Diet  . omeprazole (PRILOSEC) 40 MG capsule Take 40 mg by mouth daily.   . sitaGLIPtin (JANUVIA) 50 MG tablet Take 50 mg by mouth daily.   No facility-administered encounter medications on file as of 10/02/2020.     SIGNIFICANT DIAGNOSTIC EXAMS   PREVIOUS:   08-24-18: ct of head: 1. 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 Increased volume loss of the infarcted brain as well as decreased herniation of brain and dura through the craniectomy defect. 2. No new intracranial abnormality.  NO NEW EXAMS.   LABS REVIEWED PREVIOUS:   11-07-19: vit D 46.75 12-05-19: wbc 10.5; hgb 14.2; hct 47.5; mcv 94.1 plt 158; glucose 84; bun 11; creat 0.80 k+ 4.1; na++ 140; ca 9.2 liver normal albumin 3.1 hgb a1c 4.9 chol 132; ldl 77; trig 110; hdl 33  01-09-20: vit D 43.63 02-06-20: urine  micro-albumin: 27.4  03-13-20: glucose 116; bun 12; creat 0.67; k+ 3.8; na++ 136; ca 8.8 liver normal albumin 3.1 vit D 29.72 03-28-20: wbc 15.5; hgb 15.6; hct 51.7; mcv 93.5 plt clump; glucose 153; bun 16; creat 0.99; k+ 4.4; na++ 137; ca 9.6 tsh 0.811; hgb a1c 4.9;  03-29-20: chol 143 ldl 96 trig 83 hdl 30 HIV nr  04-06-20: wbc 10.9; hgb 12.7; hct 42.2; mcv 95.5 plt 171; glucose 91; bun 11; creat 0.76; k+ 4.7; na++ 140; ca 9.0 liver normal albumin 2.9 depakote 34  04-16-20: depakote 56   08-06-20: Vit D  40.07  NO NEW LABS.    Review of Systems  Constitutional: Negative for malaise/fatigue.  Respiratory:  Negative for cough and shortness of breath.   Cardiovascular: Negative for chest pain, palpitations and leg swelling.  Gastrointestinal: Negative for abdominal pain, constipation and heartburn.  Musculoskeletal: Negative for back pain, joint pain and myalgias.  Skin: Negative.   Neurological: Negative for dizziness.  Psychiatric/Behavioral: The patient is not nervous/anxious.      Physical Exam Constitutional:      General: He is not in acute distress.    Appearance: He is well-developed and well-nourished. He is not diaphoretic.  Neck:     Thyroid: No thyromegaly.  Cardiovascular:     Rate and Rhythm: Normal rate and regular rhythm.     Pulses: Normal pulses and intact distal pulses.     Heart sounds: Normal heart sounds.  Pulmonary:     Effort: Pulmonary effort is normal. No respiratory distress.     Breath sounds: Normal breath sounds.  Abdominal:     General: Bowel sounds are normal. There is no distension.     Palpations: Abdomen is soft.     Tenderness: There is no abdominal tenderness.  Musculoskeletal:        General: No edema.     Cervical back: Neck supple.     Right lower leg: No edema.     Left lower leg: No edema.     Comments: Left hemiplegia with stiffness     Lymphadenopathy:     Cervical: No cervical adenopathy.  Skin:    General: Skin is warm and dry.   Neurological:     Mental Status: He is alert. Mental status is at baseline.  Psychiatric:        Mood and Affect: Mood and affect and mood normal.       ASSESSMENT/ PLAN:  TODAY;   1. Hemiplegia affecting left side as late effect of cerebrovascular accident (CVA) is stable will continue baclofen 5 mg three times daily for spasticity and biofreeze to left shoulder twice daily   2. GERD without esophagitis: is stable will continue prilosec 40 mg daily  3. AF (paroxsymal atrial fibrillation) heart rate is stable will continue lopressor 12.5 mg twice daily for rate control and is on eliquis 5 mg twice daily    PREVIOUS   4. Dyslipidemia associated with type 2 diabetes mellitus is stable LDL 96 will continue lipitor 80 mg daily   5. Hypertension associated with type 2 diabetes mellitus: is stable b/p 124/53 will continue lopressor 12.5 mg twice daily   6. Type 2 diabetes mellitus with neurological complications: is stable hgb a1c 4.9 diet controlled will monitor   7. Diabetic peripheral neuropathy: is stable will continue gabapentin 200 mg nightly   8. Seizures: is stable no recent seizure activity; will continue keppra 500 mg twice daily will at times decline medications.   9. Chronic constipation: is stable will continue senna 2 tabs twice daily   10. Cerebrovascular accident (CVA) due to embolism of right middle cerebral artery is status post compression right frontotemporopartietal craniotomy with duraplasty 2018 is stable will continue eliquis 5 mg twice daily   11. Vit D deficiency: is stable level 40.07 will continue 50,000 units weekly     MD is aware of resident's narcotic use and is in agreement with current plan of care. We will attempt to wean resident as appropriate.  Ok Edwards NP [facility]  Contact [phone] Monday through Friday 8am- 5pm  After hours call [phone]

## 2020-10-05 ENCOUNTER — Ambulatory Visit: Payer: Medicaid Other | Admitting: Adult Health

## 2020-10-05 NOTE — Progress Notes (Deleted)
[facility]  PATIENT: Joe Wilkinson DOB: 02-Jun-1961   REASON FOR VISIT: Follow-up for history of stroke August 2018 HISTORY FROM: Patient   No chief complaint on file.    HISTORY OF PRESENT ILLNESS:  Today, 10/05/2020, Joe Wilkinson returns for 16-month seizure and stroke follow-up.  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  Initiated on Keppra twice daily and requested EEG be obtained outpatient He has not had any additional seizure activity since that time Keppra changed to Depakote by facility due to inability to swallow oral pills and concern for inadequate dose through liquid form Currently on Depakote 750 mg every 8 hours tolerating well without side effects Facility plans on obtaining valproic acid level on 8/30 Residual stroke deficits left spastic hemiplegia and left peripheral loss has been stable without worsening Remains nonambulatory and transfers via wheelchair Remains on Eliquis and atorvastatin for secondary stroke prevention without side effects Blood pressure today 124/84 He apparently had follow-up with [facility] neurosurgery and was told they could not complete cranioplasty but unable to  verify reason by patient. Skullcap from abdomen removed. No further concerns  04/02/2019 update JM: Joe Wilkinson is a 60 year old male who is being seen today for stroke follow-up.  Residual deficits of left spastic hemiplegia and persistent right-sided scalp swelling.  Per patient report, will be having additional imaging to potentially undergo cranioplasty but is unsure when this will occur.  He continues to be followed in [facility] by neurosurgery.  He is wheelchair-bound due to residual deficits and dependent for majority of care.  Currently on baclofen 5 mg twice daily for spasticity pain but does endorse ongoing pain with only mild improvement.  Blood pressure today 154/78.  Continues on Eliquis for secondary stroke prevention and atrial fibrillation without bleeding or bruising.  Continues on atorvastatin 20 mg daily without myalgias.  He continues to reside at [facility].  No further concerns at this time.   UPDATE 9/5/2019[name], 60 year old male returns for follow-up with a history of stroke event in August 2018.  He is currently residing in a skilled facility.  He remains on Eliquis for secondary stroke prevention and atrial fibrillation.  He has not had further stroke or TIA symptoms.  He has a PEG tube with feedings at  night.  He is on a regular diet.  He continues to have significant left hemiparesis and a persistent right sided scalp swelling.  His cranioplasty has been postponed.  He sees a physician at [facility] he is due for follow-up in 6 months the patient says.  He is wheelchair-bound transfers with a Hoyer lift.  No recent falls.  He remains dependent for all activities of daily living except feeding himself.  Blood pressure in the office today 151/94.  Most recent hemoglobin A1c done  12/06/17  was 4.5.  Reviewed recent CBC BMP from 03/28/2018 within normal limits.  Vitamin D level 30.2 he returns for reevaluation.  UPDATE 3/5/2019[name], 60 year old male returns for follow-up with  history of stroke in August 2018.  He remains on Eliquis for secondary stroke prevention and atrial fibrillation.  He was readmitted to the hospital on 09/24/2017 for sepsis and perianal abscess.  His PEG tube was dislodged.  He continues to have hemiparesis affecting the left side from his stroke event.  He continues to have a persistent right-sided scalp swelling and cranioplasty has been postponed until the swelling subsides.  He remains on a puree  diet and gets PEG feedings at night. Patient is wheelchair-bound.  His physical therapy was stopped because patient was not making progress.  No recent falls.  He is dependent for all activities of daily living.   He returns for reevaluation  Initial visit 07/11/17 Dr. Leonie Man: Joe Wilkinson is a 36 year african american male seen today for first office follow-up visit following hospital admission for stroke in August 2018.  He is accompanied by [facility] center nursing home attendant where he stays. No family is available. I have personally reviewed the electronic medical records and imaging films. Joe Kneece Priceis an 61 y.o.malewho presented to [facility] in DKA. He was found lethargic at a bus stop and then became unresponsive. EMS reported O2 sat of 80% and BP of 90/40. His CBG revealed hyperglycemia. On arrival to [facility] the patient patient could tell the examiner his name but was unable to provide any other information. He was restless, moaning and moving all extremities, not following commands. Labs were consistent with DKA and he was started on insulin drip. Initial CT head at [facility] showed no acute abnormality. He developed Kussmaul breathing requiring intubation. 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 He was transported to [facility] for further care. He   developed hypotension requiring pressors.   He was taken for an emergent right hemi-craniectomy for malignant cerebral edema by Dr. Cyndy Freeze and had a prolonged neuro ICU stay which was complicated by acute respiratory failure with hypoxia, hypercarbia, septic shock secondary to Streptococcus agalactiae  pneumonia.  He also developed new onset atrial fibrillation with rapid ventricular rate.  E. coli and Klebsiella urinary tract infection.  Perianal cellulitis with small abscess.  He had a prolonged ICU stay and was eventually weaned off ventilatory support.  Follow-up CT scan showed significant right hemispheric edema with herniation out outside craniotomy bone defect.  He underwent PEG tube placement by trauma team.  He was discharged on 05/29/17 to pain center for rehabilitation.  He is mental status has improved and he can speak and communicate but has persistent spastic dense hemiplegia.  He develops intermittent confusion.  He is still has persistent right-sided scalp swelling and has been seen in neurosurgery follow-up clinic on 06/21/17 and the decision to do cranioplasty has been postponed for a few more months until the swelling subsides.  He can eat a dysphagia 3 diet but is still getting mostly PEG tube feeds.  Patient has had a few falls.  He remains full support and requires 2 person support to stand and take a few steps.  He is getting ongoing physical occupational and speech therapy.      REVIEW OF SYSTEMS: Full 14 system review  of systems performed and notable only for those listed, all others are neg:  Constitutional: neg  Cardiovascular: neg Ear/Nose/Throat: neg  Skin: neg Eyes: neg Respiratory: neg Gastroitestinal: neg  Hematology/Lymphatic: neg  Endocrine: neg Musculoskeletal: neg Allergy/Immunology: neg Neurological: Left hemiplegia, left hemianopia and seizures Psychiatric: neg Sleep : neg   ALLERGIES: Allergies  Allergen Reactions  . Cheese   . Penicillins Hives    Has patient had a PCN reaction causing  immediate rash, facial/tongue/throat swelling, SOB or lightheadedness with hypotension: Unknown Has patient had a PCN reaction causing severe rash involving mucus membranes or skin necrosis: Unknown Has patient had a PCN reaction that required hospitalization: No Has patient had a PCN reaction occurring within the last 10 years: No If all of the above answers are "NO", then may proceed with Cephalosporin use.     HOME MEDICATIONS: Outpatient Medications Prior to Visit  Medication Sig Dispense Refill  . acetaminophen (TYLENOL) 325 MG tablet Take 650 mg by mouth every 4 (four) hours as needed. Not to exceed 3 grams in 24 hours    . apixaban (ELIQUIS) 5 MG TABS tablet Take 5 mg by mouth 2 (two) times daily.     Marland Kitchen atorvastatin (LIPITOR) 80 MG tablet Take 80 mg by mouth daily.    . Baclofen 5 MG TABS Take 5 mg by mouth 3 (three) times daily. 90 tablet 3  . bisacodyl (DULCOLAX) 10 MG suppository Place 1 suppository (10 mg total) rectally daily as needed for moderate constipation. 12 suppository 0  . Cholecalciferol 1.25 MG (50000 UT) capsule Take 50,000 Units by mouth once a week. For Low Vitamin D Level on Monday    . coal tar (NEUTROGENA T-GEL) 0.5 % shampoo Apply 1 application topically See admin instructions. Apply to scalp on shower days twice a week Wed And Sat prn    . Dextromethorphan-guaiFENesin (ROBITUSSIN COUGH+CHEST CONG DM PO) Take 10 mLs by mouth every 6 (six) hours as needed.    . docusate (COLACE) 50 MG/5ML liquid Give 10 ml by mouth twice a day    . gabapentin (NEURONTIN) 100 MG capsule Take 200 mg by mouth at bedtime.     . levETIRAcetam (KEPPRA) 100 MG/ML solution Take 500 mg by mouth 2 (two) times daily. For Seizures Mix in Grape Juice    . LORazepam (ATIVAN) 2 MG/ML injection Inject 0.5 mLs (1 mg total) into the muscle every 15 (fifteen) minutes as needed. Up to 3 doses for uncontrolled seizure activity 3 mL 0  . Menthol, Topical Analgesic, (BIOFREEZE) 4 % GEL Apply 1  application topically 2 (two) times daily. Apply to left shoulder    . metoprolol tartrate (LOPRESSOR) 25 MG tablet Take 12.5 mg by mouth 2 (two) times daily.    . NON FORMULARY Regular Diet    . omeprazole (PRILOSEC) 40 MG capsule Take 40 mg by mouth daily.     . sitaGLIPtin (JANUVIA) 50 MG tablet Take 50 mg by mouth daily.     No facility-administered medications prior to visit.    PAST MEDICAL HISTORY: Past Medical History:  Diagnosis Date  . DM (diabetes mellitus) ([place])   . Hypertension   . Stroke [facility])     PAST SURGICAL HISTORY: Past Surgical History:  Procedure Laterality Date  . CRANIECTOMY Right 04/01/2017   Procedure: RIGHT DECOMPRESSIVE CRANIECTOMY;  Surgeon: Ditty, Kevan Ny, MD;  Location: [place];  Service: Neurosurgery;  Laterality: Right;  . ESOPHAGOGASTRODUODENOSCOPY N/A 04/13/2017   Procedure: ESOPHAGOGASTRODUODENOSCOPY (EGD);  Surgeon: Grandville Silos,  Lavone Neri, MD;  Location: [place];  Service: General;  Laterality: N/A;  bedside  . INCISION AND DRAINAGE PERIRECTAL ABSCESS N/A 09/28/2017   Procedure: IRRIGATION AND DEBRIDEMENT PERIANAL ABSCESS;  Surgeon: Virl Cagey, MD;  Location: [facility];  Service: General;  Laterality: N/A;  . PEG PLACEMENT N/A 04/13/2017   Procedure: PERCUTANEOUS ENDOSCOPIC GASTROSTOMY (PEG) PLACEMENT;  Surgeon: Georganna Skeans, MD;  Location: [facility] ENDOSCOPY;  Service: General;  Laterality: N/A;    FAMILY HISTORY: Family History  Problem Relation Age of Onset  . Hypertension Father     SOCIAL HISTORY: Social History   Socioeconomic History  . Marital status: Unknown    Spouse name: Not on file  . Number of children: Not on file  . Years of education: Not on file  . Highest education level: Not on file  Occupational History  . Not on file  Tobacco Use  . Smoking status: Former Smoker    Types: Cigarettes  . Smokeless tobacco: Never Used  . Tobacco comment: UTA  Vaping Use  . Vaping Use: Never used  Substance and Sexual Activity   . Alcohol use: No    Comment: UTA  . Drug use: No    Comment: UTA  . Sexual activity: Not Currently    Birth control/protection: None  Other Topics Concern  . Not on file  Social History Narrative  . Not on file   Social Determinants of Health   Financial Resource Strain: Not on file  Food Insecurity: Not on file  Transportation Needs: Not on file  Physical Activity: Not on file  Stress: Not on file  Social Connections: Not on file  Intimate Partner Violence: Not on file     PHYSICAL EXAM  There were no vitals filed for this visit. There is no height or weight on file to calculate BMI.  Generalized: Well developed, pleasant middle-aged African-American male, in no acute distress  Head: normocephalic and atraumatic,. Oropharynx benign  Neck: Supple, no carotid bruits  Cardiac: Regular rate rhythm, no murmur  Musculoskeletal: No deformity   Neurological examination  Mentation: Alert oriented to time, place, history taking. Attention span and concentration appropriate. Recent and remote memory intact.  Follows all commands, mild dysarthria.  Cranial nerve II-XII: Pupils were equal round reactive to light extraocular movements were full, visual field with dense left homonymous hemianopia on confrontational test.  Mild left lower facial weakness.  Hearing was intact to finger rubbing bilaterally. Uvula tongue midline. head turning and shoulder shrug were normal and symmetric.Tongue protrusion into cheek strength was normal. Motor: Left spastic hemiplegia.  Normal strength and tone right upper and lower extremity Sensory: normal and symmetric to light touch, pinprick, and  Vibration in the upper and lower extremities on the right diminished on the left Coordination: finger-nose-finger, heel-to-shin performed accurately on the right unable on the left  Reflexes: 2+ on the right brisker on the left plantar responses were downgoing on the right and upgoing on the left  Gait and  Station: [employer]   DIAGNOSTIC DATA (LABS, IMAGING, TESTING) - I reviewed patient records, labs, notes, testing and imaging myself where available.  Lab Results  Component Value Date   WBC 10.9 (H) 04/06/2020   HGB 12.7 (L) 04/06/2020   HCT 42.2 04/06/2020   MCV 95.5 04/06/2020   PLT 171 04/06/2020      Component Value Date/Time   NA 138 09/10/2020 0828   K 3.7 09/10/2020 0828   CL 109 09/10/2020 0828   CO2 25  09/10/2020 0828   GLUCOSE 163 (H) 09/10/2020 0828   BUN 8 09/10/2020 0828   CREATININE 0.60 (L) 09/10/2020 0828   CALCIUM 9.0 09/10/2020 0828   CALCIUM 11.5 (H) 09/05/2017 0709   PROT 6.4 (L) 09/10/2020 0828   ALBUMIN 2.7 (L) 09/10/2020 0828   AST 12 (L) 09/10/2020 0828   ALT 14 09/10/2020 0828   ALKPHOS 73 09/10/2020 0828   BILITOT 0.8 09/10/2020 0828   GFRNONAA >60 09/10/2020 0828   GFRAA >60 04/06/2020 0630   Lab Results  Component Value Date   CHOL 115 09/10/2020   HDL 31 (L) 09/10/2020   LDLCALC 62 09/10/2020   TRIG 108 09/10/2020   CHOLHDL 3.7 09/10/2020   Lab Results  Component Value Date   HGBA1C 7.5 (H) 09/10/2020   Lab Results  Component Value Date   [id number] 673 04/04/2017   Lab Results  Component Value Date   TSH 1.233 09/10/2020         ASSESSMENT AND PLAN Joe Wilkinson is a 60 year African-American male with 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 Fib -Continue close PCP follow-up for  aggressive stroke risk factor management  S/p hemicraniectomy -Followed by [facility] neurosurgery -Apparently unable to replace skullcap per patient -Scalp Has been removed from the abdomen   Follow-up in 6 months or call earlier if needed   CC:  [facility] provider: Dr. Lisette Abu, Phylis Bougie, NP    I spent 30 minutes of face-to-face and non-face-to-face time with patient.  This included previsit chart review, lab review, study review, order entry, electronic health record documentation, patient education regarding recent seizure activity with ongoing use of Depakote and explanation EEG for seizure, hx of prior stroke and residual deficits, importance of managing stroke risk factors and answered all questions to patient satisfaction   Frann Rider, AGNP-BC  [facility] [address] [place], [place] [address]  Phone [phone] Fax [phone] Note: This document was prepared with digital dictation and possible smart phrase technology. Any transcriptional errors that result from this process are unintentional.

## 2020-10-07 LAB — HM DIABETES EYE EXAM

## 2020-10-08 ENCOUNTER — Other Ambulatory Visit: Payer: Self-pay | Admitting: Adult Health

## 2020-11-04 ENCOUNTER — Encounter: Payer: Self-pay | Admitting: Adult Health

## 2020-11-04 ENCOUNTER — Non-Acute Institutional Stay (SKILLED_NURSING_FACILITY): Payer: Medicaid Other | Admitting: Adult Health

## 2020-11-04 DIAGNOSIS — E1159 Type 2 diabetes mellitus with other circulatory complications: Secondary | ICD-10-CM | POA: Diagnosis not present

## 2020-11-04 DIAGNOSIS — E785 Hyperlipidemia, unspecified: Secondary | ICD-10-CM

## 2020-11-04 DIAGNOSIS — E1169 Type 2 diabetes mellitus with other specified complication: Secondary | ICD-10-CM

## 2020-11-04 DIAGNOSIS — I152 Hypertension secondary to endocrine disorders: Secondary | ICD-10-CM

## 2020-11-04 DIAGNOSIS — E43 Unspecified severe protein-calorie malnutrition: Secondary | ICD-10-CM | POA: Diagnosis not present

## 2020-11-04 DIAGNOSIS — E1149 Type 2 diabetes mellitus with other diabetic neurological complication: Secondary | ICD-10-CM

## 2020-11-04 NOTE — Progress Notes (Signed)
Location:  [facility] Room Number: 152/D Place of Service:  SNF (31)   CODE STATUS: Full Code  Allergies  Allergen Reactions  . Cheese   . Penicillins Hives    Has patient had a PCN reaction causing immediate rash, facial/tongue/throat swelling, SOB or lightheadedness with hypotension: Unknown Has patient had a PCN reaction causing severe rash involving mucus membranes or skin necrosis: Unknown Has patient had a PCN reaction that required hospitalization: No Has patient had a PCN reaction occurring within the last 10 years: No If all of the above answers are "NO", then may proceed with Cephalosporin use.     Chief Complaint  Patient presents with  . Medical Management of Chronic Issues          Dyslipidemia associated with type 2 diabetes mellitus:    Hypertension associated with type 2 diabetes mellitus  Type 2 diabetes mellitus with neurological complications     HPI:  He is a 60 year old long term resident of this facility being seen for the management of his chronic illnesses:Dyslipidemia associated with type 2 diabetes mellitus:    Hypertension associated with type 2 diabetes mellitus  Type 2 diabetes mellitus with neurological complications. There are no reports of him declining medications; no reports of uncontrolled pain;no reports of changes in appetite.    Past Medical History:  Diagnosis Date  . DM (diabetes mellitus) ([place])   . Hypertension   . Stroke [facility])     Past Surgical History:  Procedure Laterality Date  . CRANIECTOMY Right 04/01/2017   Procedure: RIGHT DECOMPRESSIVE CRANIECTOMY;  Surgeon: Ditty, Kevan Ny, MD;  Location: [place];  Service: Neurosurgery;  Laterality: Right;  . ESOPHAGOGASTRODUODENOSCOPY N/A 04/13/2017   Procedure: ESOPHAGOGASTRODUODENOSCOPY (EGD);  Surgeon: Georganna Skeans, MD;  Location: [place];  Service: General;  Laterality: N/A;  bedside  . INCISION AND DRAINAGE PERIRECTAL ABSCESS N/A 09/28/2017    Procedure: IRRIGATION AND DEBRIDEMENT PERIANAL ABSCESS;  Surgeon: Virl Cagey, MD;  Location: [facility];  Service: General;  Laterality: N/A;  . PEG PLACEMENT N/A 04/13/2017   Procedure: PERCUTANEOUS ENDOSCOPIC GASTROSTOMY (PEG) PLACEMENT;  Surgeon: Georganna Skeans, MD;  Location: [facility] ENDOSCOPY;  Service: General;  Laterality: N/A;    Social History   Socioeconomic History  . Marital status: Unknown    Spouse name: Not on file  . Number of children: Not on file  . Years of education: Not on file  . Highest education level: Not on file  Occupational History  . Not on file  Tobacco Use  . Smoking status: Former Smoker    Types: Cigarettes  . Smokeless tobacco: Never Used  . Tobacco comment: UTA  Vaping Use  . Vaping Use: Never used  Substance and Sexual Activity  . Alcohol use: No    Comment: UTA  . Drug use: No    Comment: UTA  . Sexual activity: Not Currently    Birth control/protection: None  Other Topics Concern  . Not on file  Social History Narrative  . Not on file   Social Determinants of Health   Financial Resource Strain: Not on file  Food Insecurity: Not on file  Transportation Needs: Not on file  Physical Activity: Not on file  Stress: Not on file  Social Connections: Not on file  Intimate Partner Violence: Not on file   Family History  Problem Relation Age of Onset  . Hypertension Father       VITAL SIGNS BP (!) 144/79  Pulse (!) 51   Temp 98.4 F (36.9 C)   Ht 5\' 6"  (1.676 m)   Wt 177 lb 3.2 oz (80.4 kg)   BMI 28.60 kg/m   Outpatient Encounter Medications as of 11/04/2020  Medication Sig  . acetaminophen (TYLENOL) 325 MG tablet Take 650 mg by mouth every 4 (four) hours as needed. Not to exceed 3 grams in 24 hours  . apixaban (ELIQUIS) 5 MG TABS tablet Take 5 mg by mouth 2 (two) times daily.   Marland Kitchen atorvastatin (LIPITOR) 80 MG tablet Take 80 mg by mouth daily.  . Baclofen 5 MG TABS Take 5 mg by mouth 3 (three) times daily.  . bisacodyl  (DULCOLAX) 10 MG suppository Place 1 suppository (10 mg total) rectally daily as needed for moderate constipation.  . Cholecalciferol 1.25 MG (50000 UT) capsule Take 50,000 Units by mouth once a week. For Low Vitamin D Level on Monday  . coal tar (NEUTROGENA T-GEL) 0.5 % shampoo Apply 1 application topically See admin instructions. Apply to scalp on shower days twice a week Wed And Sat prn  . Dextromethorphan-guaiFENesin (ROBITUSSIN COUGH+CHEST CONG DM PO) Take 10 mLs by mouth every 6 (six) hours as needed.  . docusate (COLACE) 50 MG/5ML liquid Give 10 ml by mouth twice a day  . gabapentin (NEURONTIN) 100 MG capsule Take 100 mg by mouth at bedtime.  . levETIRAcetam (KEPPRA) 100 MG/ML solution Take 500 mg by mouth 2 (two) times daily. For Seizures Mix in Grape Juice  . LORazepam (ATIVAN) 2 MG/ML injection Inject 0.5 mLs (1 mg total) into the muscle every 15 (fifteen) minutes as needed. Up to 3 doses for uncontrolled seizure activity  . Menthol, Topical Analgesic, (BIOFREEZE) 4 % GEL Apply 1 application topically 2 (two) times daily. Apply to left shoulder  . metFORMIN (GLUCOPHAGE) 500 MG tablet Take by mouth 2 (two) times daily with a meal.  . metoprolol tartrate (LOPRESSOR) 25 MG tablet Take 12.5 mg by mouth 2 (two) times daily.  . NON FORMULARY Regular Diet  . omeprazole (PRILOSEC) 40 MG capsule Take 40 mg by mouth daily.   . [DISCONTINUED] sitaGLIPtin (JANUVIA) 50 MG tablet Take 50 mg by mouth daily.   No facility-administered encounter medications on file as of 11/04/2020.     SIGNIFICANT DIAGNOSTIC EXAMS   PREVIOUS:   08-24-18: ct of head: 1. 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 Increased volume loss of the infarcted brain as well as decreased herniation of brain and dura through the craniectomy defect. 2. No new intracranial abnormality.  NO NEW EXAMS.   LABS REVIEWED PREVIOUS:   11-07-19: vit D 46.75 12-05-19: wbc 10.5; hgb 14.2; hct 47.5; mcv  94.1 plt 158; glucose 84; bun 11; creat 0.80 k+ 4.1; na++ 140; ca 9.2 liver normal albumin 3.1 hgb a1c 4.9 chol 132; ldl 77; trig 110; hdl 33  01-09-20: vit D 43.63 02-06-20: urine micro-albumin: 27.4  03-13-20: glucose 116; bun 12; creat 0.67; k+ 3.8; na++ 136; ca 8.8 liver normal albumin 3.1 vit D 29.72 03-28-20: wbc 15.5; hgb 15.6; hct 51.7; mcv 93.5 plt clump; glucose 153; bun 16; creat 0.99; k+ 4.4; na++ 137; ca 9.6 tsh 0.811; hgb a1c 4.9;  03-29-20: chol 143 ldl 96 trig 83 hdl 30 HIV nr  04-06-20: wbc 10.9; hgb 12.7; hct 42.2; mcv 95.5 plt 171; glucose 91; bun 11; creat 0.76; k+ 4.7; na++ 140; ca 9.0 liver normal albumin 2.9 depakote 34  04-16-20: depakote 56   08-06-20:  Vit D  40.07  TODAY  09-10-20: glucose 163; bun 8; creat 0.60; k+ 3.7; na++ 138; ca 9.0 GFR>60; liver normal albumin 2.7; hgb a1c 7.5; chol 115; ldl 62; trig 108; hdl 31; tsh 1.233   Review of Systems  Constitutional: Negative for malaise/fatigue.  Respiratory: Negative for cough and shortness of breath.   Cardiovascular: Negative for chest pain, palpitations and leg swelling.  Gastrointestinal: Negative for abdominal pain, constipation and heartburn.  Musculoskeletal: Negative for back pain, joint pain and myalgias.  Skin: Negative.   Neurological: Negative for dizziness.  Psychiatric/Behavioral: The patient is not nervous/anxious.      Physical Exam Constitutional:      General: He is not in acute distress.    Appearance: He is well-developed. He is not diaphoretic.  Neck:     Thyroid: No thyromegaly.  Cardiovascular:     Rate and Rhythm: Normal rate and regular rhythm.     Pulses: Normal pulses.     Heart sounds: Normal heart sounds.  Pulmonary:     Effort: Pulmonary effort is normal. No respiratory distress.     Breath sounds: Normal breath sounds.  Abdominal:     General: Bowel sounds are normal. There is no distension.     Palpations: Abdomen is soft.     Tenderness: There is no abdominal tenderness.   Musculoskeletal:     Cervical back: Neck supple.     Right lower leg: No edema.     Left lower leg: No edema.     Comments: Left hemiplegia with stiffness      Lymphadenopathy:     Cervical: No cervical adenopathy.  Skin:    General: Skin is warm and dry.  Neurological:     Mental Status: He is alert. Mental status is at baseline.  Psychiatric:        Mood and Affect: Mood normal.     ASSESSMENT/ PLAN:  TODAY;   1. Dyslipidemia associated with type 2 diabetes mellitus: is stable LDL 62 will lower lipitor to 40 mg daily  2. Hypertension associated with type 2 diabetes mellitus is stable b/p 144/79 will continue lopressor 12.5 mg twice daily   3. Type 2 diabetes mellitus with neurological complications: hgb N6E is up at 7.5 will begin januvia 100 mg daily will monitor   4. Protein calorie malnutrition severe: albumin 2.7 will begin prostat 30 mL three times daily   PREVIOUS   5. Diabetic peripheral neuropathy: is stable will continue gabapentin 200 mg nightly   6. Seizures: is stable no recent seizure activity; will continue keppra 500 mg twice daily will at times decline medications.   7. Chronic constipation: is stable will continue senna 2 tabs twice daily   8. Cerebrovascular accident (CVA) due to embolism of right middle cerebral artery is status post compression right frontotemporopartietal craniotomy with duraplasty 2018 is stable will continue eliquis 5 mg twice daily   9. Vit D deficiency: is stable level 40.07 will continue 50,000 units weekly   10. 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 GERD without esophagitis: is stable will continue prilosec 40 mg daily  12. AF (paroxsymal atrial fibrillation) heart rate is stable will continue lopressor 12.5 mg twice daily for rate control and is on eliquis 5 mg  twice daily     Ok Edwards NP [facility]  Contact [phone] Monday through Friday 8am- 5pm  After hours call [phone]

## 2020-11-09 DIAGNOSIS — E43 Unspecified severe protein-calorie malnutrition: Secondary | ICD-10-CM | POA: Insufficient documentation

## 2020-11-10 ENCOUNTER — Ambulatory Visit: Payer: Medicaid Other | Admitting: Adult Health

## 2020-11-10 NOTE — Progress Notes (Deleted)
[facility]   PATIENT: Joe Wilkinson DOB: 12/16/1960   REASON FOR VISIT: Follow-up for history of stroke August 2018 HISTORY FROM: Patient   No chief complaint on file.    HISTORY OF PRESENT ILLNESS:  Joe Wilkinson is a 60 y.o. male with PMHx of large R MCA stroke secondary to A. Fib 03/2017 s/p hemicraniectomy for malignant cerebral edema, seizures likely late effect of stroke, HTN, HLD, and DM.    Today, 11/10/2020, Mr. 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 Remains nonambulatory and transfers via wheelchair Per review of facility [facility], continues on Eliquis and atorvastatin -denies related side effects Blood pressure today *** Recently started on Januvia for A1c 7.5  No additional seizure activity He has now been placed back on Keppra suspension tolerating without side effects Initially on Keppra and facility change to Depakote due to difficulty swallowing oral pills but recently noted that he has been spitting out Depakote pills therefore switch back to Keppra liquid form     04/02/2020: [name] returns for 1 year follow-up as well as recent hospitalization for seizure-like episode He continues with [facility] and is unaccompanied at today's visit  Presented from SNF on 03/28/2020 for seizure activity upon awakening followed by postictal state.  Initiated on Keppra twice daily and requested EEG be obtained outpatient  He has not had any additional seizure activity since that time Keppra changed to Depakote by facility due to inability to swallow oral pills and concern for inadequate dose through liquid form Currently on Depakote 750 mg every 8 hours tolerating well without side effects Facility plans on obtaining valproic acid level on  8/30  Residual stroke deficits left spastic hemiplegia and left peripheral loss has been stable without worsening Remains nonambulatory and transfers via wheelchair Remains on Eliquis and atorvastatin for secondary stroke prevention without side effects Blood pressure today 124/84  He apparently had follow-up with [facility] neurosurgery and was told they could not complete cranioplasty but unable to verify reason by patient. Skullcap from abdomen removed.  No further concerns       REVIEW OF SYSTEMS: Full 14 system review of systems performed and notable only for those listed, all others are neg:  Constitutional: neg  Cardiovascular: neg Ear/Nose/Throat: neg  Skin: neg Eyes: neg Respiratory: neg Gastroitestinal: neg  Hematology/Lymphatic: neg  Endocrine: neg Musculoskeletal: neg Allergy/Immunology: neg Neurological: Left hemiplegia, left hemianopia and seizures Psychiatric: neg Sleep : neg   ALLERGIES: Allergies  Allergen Reactions  . Cheese   . Penicillins Hives    Has patient had a PCN reaction causing immediate rash, facial/tongue/throat swelling, SOB or lightheadedness with hypotension: Unknown Has patient had a PCN reaction causing severe rash involving mucus membranes or skin necrosis: Unknown Has patient had a PCN reaction that required hospitalization: No Has patient had a PCN reaction occurring within the last 10 years: No If all of the above answers are "NO", then may proceed with Cephalosporin use.     HOME MEDICATIONS: Outpatient Medications Prior to Visit  Medication Sig Dispense Refill  . acetaminophen (TYLENOL) 325 MG tablet Take 650 mg by mouth every 4 (four) hours as needed. Not to exceed 3 grams in 24 hours    . apixaban (ELIQUIS) 5 MG TABS tablet  Take 5 mg by mouth 2 (two) times daily.     Marland Kitchen atorvastatin (LIPITOR) 80 MG tablet Take 80 mg by mouth daily.    . Baclofen 5 MG TABS Take 5 mg by mouth 3 (three) times daily. 90 tablet 3  . bisacodyl  (DULCOLAX) 10 MG suppository Place 1 suppository (10 mg total) rectally daily as needed for moderate constipation. 12 suppository 0  . Cholecalciferol 1.25 MG (50000 UT) capsule Take 50,000 Units by mouth once a week. For Low Vitamin D Level on Monday    . coal tar (NEUTROGENA T-GEL) 0.5 % shampoo Apply 1 application topically See admin instructions. Apply to scalp on shower days twice a week Wed And Sat prn    . Dextromethorphan-guaiFENesin (ROBITUSSIN COUGH+CHEST CONG DM PO) Take 10 mLs by mouth every 6 (six) hours as needed.    . docusate (COLACE) 50 MG/5ML liquid Give 10 ml by mouth twice a day    . gabapentin (NEURONTIN) 100 MG capsule Take 100 mg by mouth at bedtime.    . levETIRAcetam (KEPPRA) 100 MG/ML solution Take 500 mg by mouth 2 (two) times daily. For Seizures Mix in Grape Juice    . LORazepam (ATIVAN) 2 MG/ML injection Inject 0.5 mLs (1 mg total) into the muscle every 15 (fifteen) minutes as needed. Up to 3 doses for uncontrolled seizure activity 3 mL 0  . Menthol, Topical Analgesic, (BIOFREEZE) 4 % GEL Apply 1 application topically 2 (two) times daily. Apply to left shoulder    . metFORMIN (GLUCOPHAGE) 500 MG tablet Take by mouth 2 (two) times daily with a meal.    . metoprolol tartrate (LOPRESSOR) 25 MG tablet Take 12.5 mg by mouth 2 (two) times daily.    . NON FORMULARY Regular Diet    . omeprazole (PRILOSEC) 40 MG capsule Take 40 mg by mouth daily.      No facility-administered medications prior to visit.    PAST MEDICAL HISTORY: Past Medical History:  Diagnosis Date  . DM (diabetes mellitus) ([place])   . Hypertension   . Stroke [facility])     PAST SURGICAL HISTORY: Past Surgical History:  Procedure Laterality Date  . CRANIECTOMY Right 04/01/2017   Procedure: RIGHT DECOMPRESSIVE CRANIECTOMY;  Surgeon: Ditty, Kevan Ny, MD;  Location: [place];  Service: Neurosurgery;  Laterality: Right;  . ESOPHAGOGASTRODUODENOSCOPY N/A 04/13/2017   Procedure: ESOPHAGOGASTRODUODENOSCOPY (EGD);   Surgeon: Georganna Skeans, MD;  Location: [place];  Service: General;  Laterality: N/A;  bedside  . INCISION AND DRAINAGE PERIRECTAL ABSCESS N/A 09/28/2017   Procedure: IRRIGATION AND DEBRIDEMENT PERIANAL ABSCESS;  Surgeon: Virl Cagey, MD;  Location: [facility];  Service: General;  Laterality: N/A;  . PEG PLACEMENT N/A 04/13/2017   Procedure: PERCUTANEOUS ENDOSCOPIC GASTROSTOMY (PEG) PLACEMENT;  Surgeon: Georganna Skeans, MD;  Location: [facility] ENDOSCOPY;  Service: General;  Laterality: N/A;    FAMILY HISTORY: Family History  Problem Relation Age of Onset  . Hypertension Father     SOCIAL HISTORY: Social History   Socioeconomic History  . Marital status: Unknown    Spouse name: Not on file  . Number of children: Not on file  . Years of education: Not on file  . Highest education level: Not on file  Occupational History  . Not on file  Tobacco Use  . Smoking status: Former Smoker    Types: Cigarettes  . Smokeless tobacco: Never Used  . Tobacco comment: UTA  Vaping Use  . Vaping Use: Never used  Substance and Sexual Activity  .  Alcohol use: No    Comment: UTA  . Drug use: No    Comment: UTA  . Sexual activity: Not Currently    Birth control/protection: None  Other Topics Concern  . Not on file  Social History Narrative  . Not on file   Social Determinants of Health   Financial Resource Strain: Not on file  Food Insecurity: Not on file  Transportation Needs: Not on file  Physical Activity: Not on file  Stress: Not on file  Social Connections: Not on file  Intimate Partner Violence: Not on file     PHYSICAL EXAM  There were no vitals filed for this visit. There is no height or weight on file to calculate BMI.  Generalized: Well developed, pleasant middle-aged African-American male, in no acute distress  Head: normocephalic and atraumatic,. Oropharynx benign  Neck: Supple, no carotid bruits  Cardiac: Regular rate rhythm, no murmur  Musculoskeletal: No  deformity   Neurological examination  Mentation: Alert oriented to time, place, history taking. Attention span and concentration appropriate. Recent and remote memory intact.  Follows all commands, mild dysarthria.  Cranial nerve II-XII: Pupils were equal round reactive to light extraocular movements were full, visual field with dense left homonymous hemianopia on confrontational test.  Mild left lower facial weakness.  Hearing was intact to finger rubbing bilaterally. Uvula tongue midline. head turning and shoulder shrug were normal and symmetric.Tongue protrusion into cheek strength was normal. Motor: Left spastic hemiplegia.  Normal strength and tone right upper and lower extremity Sensory: normal and symmetric to light touch, pinprick, and  Vibration in the upper and lower extremities on the right diminished on the left Coordination: finger-nose-finger, heel-to-shin performed accurately on the right unable on the left  Reflexes: 2+ on the right brisker on the left plantar responses were downgoing on the right and upgoing on the left  Gait and Station: [employer]   DIAGNOSTIC DATA (LABS, IMAGING, TESTING) - I reviewed patient records, labs, notes, testing and imaging myself where available.  Lab Results  Component Value Date   WBC 10.9 (H) 04/06/2020   HGB 12.7 (L) 04/06/2020   HCT 42.2 04/06/2020   MCV 95.5 04/06/2020   PLT 171 04/06/2020      Component Value Date/Time   NA 138 09/10/2020 0828   K 3.7 09/10/2020 0828   CL 109 09/10/2020 0828   CO2 25 09/10/2020 0828   GLUCOSE 163 (H) 09/10/2020 0828   BUN 8 09/10/2020 0828   CREATININE 0.60 (L) 09/10/2020 0828   CALCIUM 9.0 09/10/2020 0828   CALCIUM 11.5 (H) 09/05/2017 0709   PROT 6.4 (L) 09/10/2020 0828   ALBUMIN 2.7 (L) 09/10/2020 0828   AST 12 (L) 09/10/2020 0828   ALT 14 09/10/2020 0828   ALKPHOS 73 09/10/2020 0828   BILITOT 0.8 09/10/2020 0828   GFRNONAA >60 09/10/2020 0828   GFRAA >60 04/06/2020 0630   Lab  Results  Component Value Date   CHOL 115 09/10/2020   HDL 31 (L) 09/10/2020   LDLCALC 62 09/10/2020   TRIG 108 09/10/2020   CHOLHDL 3.7 09/10/2020   Lab Results  Component Value Date   HGBA1C 7.5 (H) 09/10/2020   Lab Results  Component Value Date   LFYBOFBP10 258 04/04/2017   Lab Results  Component Value Date   TSH 1.233 09/10/2020      ASSESSMENT AND PLAN 52 year African-American male with large right middle cerebral artery infarct due to atrial fibrillation in August 2018 status post right hemicraniectomy for  malignant cerebral edema with significant residual spastic left hemiplegia and significant disability.  New onset seizure activity on 03/28/2020.  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 Fib -Continue close PCP follow-up for aggressive stroke risk factor management -HTN: BP goal<130/90.  Well-controlled on metoprolol per facility -HLD: LDL goal<70.  Recent LDL 62 on atorvastatin 40 mg daily per facility -DM: A1c goal<7.  Recent A1c 7.5 -facility recently started Januvia  S/p hemicraniectomy -Followed by [facility] neurosurgery -Apparently unable to replace skullcap per patient -Scalp Has been removed from the abdomen   Follow-up in 6 months or call earlier if needed  CC:  [facility] provider: Dr. Lisette Abu, Phylis Bougie, NP    I spent 30 minutes of face-to-face and non-face-to-face time with patient.  This included previsit chart review, lab review, study review, order entry, electronic health record documentation, patient education regarding recent  seizure activity with ongoing use of Depakote and explanation EEG for seizure, hx of prior stroke and residual deficits, importance of managing stroke risk factors and answered all questions to patient satisfaction   Frann Rider, AGNP-BC  [facility] [address] [place], [place] [address]  Phone [phone] Fax [phone] Note: This document was prepared with digital dictation and possible smart phrase technology. Any transcriptional errors that result from this process are unintentional.

## 2020-11-16 ENCOUNTER — Encounter: Payer: Self-pay | Admitting: Adult Health

## 2020-11-16 NOTE — Progress Notes (Signed)
Location:  [place] Room Number: 152/D Place of Service:  SNF (31)   CODE STATUS: Full Code  Allergies  Allergen Reactions  . Cheese   . Penicillins Hives    Has patient had a PCN reaction causing immediate rash, facial/tongue/throat swelling, SOB or lightheadedness with hypotension: Unknown Has patient had a PCN reaction causing severe rash involving mucus membranes or skin necrosis: Unknown Has patient had a PCN reaction that required hospitalization: No Has patient had a PCN reaction occurring within the last 10 years: No If all of the above answers are "NO", then may proceed with Cephalosporin use.     Chief Complaint  Patient presents with  . Acute Visit    Diabetes     HPI:    Past Medical History:  Diagnosis Date  . DM (diabetes mellitus) ([place])   . Hypertension   . Stroke [facility])     Past Surgical History:  Procedure Laterality Date  . CRANIECTOMY Right 04/01/2017   Procedure: RIGHT DECOMPRESSIVE CRANIECTOMY;  Surgeon: Ditty, Kevan Ny, MD;  Location: [place];  Service: Neurosurgery;  Laterality: Right;  . ESOPHAGOGASTRODUODENOSCOPY N/A 04/13/2017   Procedure: ESOPHAGOGASTRODUODENOSCOPY (EGD);  Surgeon: Georganna Skeans, MD;  Location: [place];  Service: General;  Laterality: N/A;  bedside  . INCISION AND DRAINAGE PERIRECTAL ABSCESS N/A 09/28/2017   Procedure: IRRIGATION AND DEBRIDEMENT PERIANAL ABSCESS;  Surgeon: Virl Cagey, MD;  Location: [facility];  Service: General;  Laterality: N/A;  . PEG PLACEMENT N/A 04/13/2017   Procedure: PERCUTANEOUS ENDOSCOPIC GASTROSTOMY (PEG) PLACEMENT;  Surgeon: Georganna Skeans, MD;  Location: [facility] ENDOSCOPY;  Service: General;  Laterality: N/A;    Social History   Socioeconomic History  . Marital status: Unknown    Spouse name: Not on file  . Number of children: Not on file  . Years of education: Not on file  . Highest education level: Not on file  Occupational History  . Not on file  Tobacco  Use  . Smoking status: Former Smoker    Types: Cigarettes  . Smokeless tobacco: Never Used  . Tobacco comment: UTA  Vaping Use  . Vaping Use: Never used  Substance and Sexual Activity  . Alcohol use: No    Comment: UTA  . Drug use: No    Comment: UTA  . Sexual activity: Not Currently    Birth control/protection: None  Other Topics Concern  . Not on file  Social History Narrative  . Not on file   Social Determinants of Health   Financial Resource Strain: Not on file  Food Insecurity: Not on file  Transportation Needs: Not on file  Physical Activity: Not on file  Stress: Not on file  Social Connections: Not on file  Intimate Partner Violence: Not on file   Family History  Problem Relation Age of Onset  . Hypertension Father       VITAL SIGNS BP (!) 142/80   Pulse 86   Temp 98 F (36.7 C)   Ht 5\' 6"  (1.676 m)   Wt 177 lb 3.2 oz (80.4 kg)   BMI 28.60 kg/m   Outpatient Encounter Medications as of 11/16/2020  Medication Sig  . acetaminophen (TYLENOL) 325 MG tablet Take 650 mg by mouth every 4 (four) hours as needed. Not to exceed 3 grams in 24 hours  . Amino Acids-Protein Hydrolys (FEEDING SUPPLEMENT, PRO-STAT SUGAR FREE 64,) LIQD Take 30 mLs by mouth 3 (three) times daily with meals.  Marland Kitchen apixaban (ELIQUIS) 5 MG TABS  tablet Take 5 mg by mouth 2 (two) times daily.   Marland Kitchen atorvastatin (LIPITOR) 40 MG tablet Take 40 mg by mouth every evening.  . Baclofen 5 MG TABS Take 5 mg by mouth 3 (three) times daily.  . bisacodyl (DULCOLAX) 10 MG suppository Place 1 suppository (10 mg total) rectally daily as needed for moderate constipation.  . Cholecalciferol 1.25 MG (50000 UT) capsule Take 50,000 Units by mouth once a week. For Low Vitamin D Level on Monday  . coal tar (NEUTROGENA T-GEL) 0.5 % shampoo Apply 1 application topically See admin instructions. Apply to scalp on shower days twice a week Wed And Sat prn  . Dextromethorphan-guaiFENesin (ROBITUSSIN COUGH+CHEST CONG DM PO) Take  10 mLs by mouth every 6 (six) hours as needed.  . docusate (COLACE) 50 MG/5ML liquid Give 10 ml by mouth twice a day  . gabapentin (NEURONTIN) 100 MG capsule Take 100 mg by mouth at bedtime.  . levETIRAcetam (KEPPRA) 100 MG/ML solution Take 500 mg by mouth 2 (two) times daily. For Seizures Mix in Grape Juice  . LORazepam (ATIVAN) 2 MG/ML injection Inject 0.5 mLs (1 mg total) into the muscle every 15 (fifteen) minutes as needed. Up to 3 doses for uncontrolled seizure activity  . Menthol, Topical Analgesic, (BIOFREEZE) 4 % GEL Apply 1 application topically 2 (two) times daily. Apply to left shoulder  . metFORMIN (GLUCOPHAGE) 500 MG tablet Take by mouth 2 (two) times daily with a meal.  . metoprolol tartrate (LOPRESSOR) 25 MG tablet Take 12.5 mg by mouth 2 (two) times daily.  . NON FORMULARY Regular Diet  . omeprazole (PRILOSEC) 40 MG capsule Take 40 mg by mouth daily.   . sitaGLIPtin (JANUVIA) 100 MG tablet Take 100 mg by mouth daily.  . [DISCONTINUED] atorvastatin (LIPITOR) 80 MG tablet Take 80 mg by mouth daily.   No facility-administered encounter medications on file as of 11/16/2020.     SIGNIFICANT DIAGNOSTIC EXAMS       ASSESSMENT/ PLAN:     Ok Edwards NP [facility]  Contact [phone] Monday through Friday 8am- 5pm  After hours call [phone]

## 2020-11-18 NOTE — Progress Notes (Signed)
This encounter was created in error - please disregard.

## 2020-12-04 ENCOUNTER — Non-Acute Institutional Stay (SKILLED_NURSING_FACILITY): Payer: Medicaid Other | Admitting: Adult Health

## 2020-12-04 ENCOUNTER — Encounter: Payer: Self-pay | Admitting: Adult Health

## 2020-12-04 DIAGNOSIS — K5909 Other constipation: Secondary | ICD-10-CM

## 2020-12-04 DIAGNOSIS — E1142 Type 2 diabetes mellitus with diabetic polyneuropathy: Secondary | ICD-10-CM | POA: Diagnosis not present

## 2020-12-04 DIAGNOSIS — R569 Unspecified convulsions: Secondary | ICD-10-CM | POA: Diagnosis not present

## 2020-12-04 NOTE — Progress Notes (Signed)
Location:  [place] Room Number: 152/D Place of Service:  SNF (31)   CODE STATUS: Full Code  Allergies  Allergen Reactions  . Cheese   . Penicillins Hives    Has patient had a PCN reaction causing immediate rash, facial/tongue/throat swelling, SOB or lightheadedness with hypotension: Unknown Has patient had a PCN reaction causing severe rash involving mucus membranes or skin necrosis: Unknown Has patient had a PCN reaction that required hospitalization: No Has patient had a PCN reaction occurring within the last 10 years: No If all of the above answers are "NO", then may proceed with Cephalosporin use.     Chief Complaint  Patient presents with  . Medical Management of Chronic Issues           Diabetic peripheral neuropathy    Seizure:   Chronic constipation:     HPI:  He is a 60 year old long term resident of this facility being seen for the management of his chronic illnesses: Diabetic peripheral neuropathy    Seizure:   Chronic constipation. He denies any pain; he has gotten out of bed at least one this time past month. He denies any constipation. No reports of insomnia  Past Medical History:  Diagnosis Date  . DM (diabetes mellitus) ([place])   . Hypertension   . Stroke [facility])     Past Surgical History:  Procedure Laterality Date  . CRANIECTOMY Right 04/01/2017   Procedure: RIGHT DECOMPRESSIVE CRANIECTOMY;  Surgeon: Ditty, Kevan Ny, MD;  Location: [place];  Service: Neurosurgery;  Laterality: Right;  . ESOPHAGOGASTRODUODENOSCOPY N/A 04/13/2017   Procedure: ESOPHAGOGASTRODUODENOSCOPY (EGD);  Surgeon: Georganna Skeans, MD;  Location: [place];  Service: General;  Laterality: N/A;  bedside  . INCISION AND DRAINAGE PERIRECTAL ABSCESS N/A 09/28/2017   Procedure: IRRIGATION AND DEBRIDEMENT PERIANAL ABSCESS;  Surgeon: Virl Cagey, MD;  Location: [facility];  Service: General;  Laterality: N/A;  . PEG PLACEMENT N/A 04/13/2017   Procedure: PERCUTANEOUS  ENDOSCOPIC GASTROSTOMY (PEG) PLACEMENT;  Surgeon: Georganna Skeans, MD;  Location: [facility] ENDOSCOPY;  Service: General;  Laterality: N/A;    Social History   Socioeconomic History  . Marital status: Unknown    Spouse name: Not on file  . Number of children: Not on file  . Years of education: Not on file  . Highest education level: Not on file  Occupational History  . Not on file  Tobacco Use  . Smoking status: Former Smoker    Types: Cigarettes  . Smokeless tobacco: Never Used  . Tobacco comment: UTA  Vaping Use  . Vaping Use: Never used  Substance and Sexual Activity  . Alcohol use: No    Comment: UTA  . Drug use: No    Comment: UTA  . Sexual activity: Not Currently    Birth control/protection: None  Other Topics Concern  . Not on file  Social History Narrative  . Not on file   Social Determinants of Health   Financial Resource Strain: Not on file  Food Insecurity: Not on file  Transportation Needs: Not on file  Physical Activity: Not on file  Stress: Not on file  Social Connections: Not on file  Intimate Partner Violence: Not on file   Family History  Problem Relation Age of Onset  . Hypertension Father       VITAL SIGNS BP 130/74   Pulse 70   Temp 97.8 F (36.6 C)   Ht 5\' 6"  (1.676 m)   Wt 176 lb 6.4  oz (80 kg)   BMI 28.47 kg/m   Outpatient Encounter Medications as of 12/04/2020  Medication Sig  . acetaminophen (TYLENOL) 325 MG tablet Take 650 mg by mouth every 4 (four) hours as needed. Not to exceed 3 grams in 24 hours  . Amino Acids-Protein Hydrolys (FEEDING SUPPLEMENT, PRO-STAT SUGAR FREE 64,) LIQD Take 30 mLs by mouth 3 (three) times daily with meals.  Marland Kitchen apixaban (ELIQUIS) 5 MG TABS tablet Take 5 mg by mouth 2 (two) times daily.   Marland Kitchen atorvastatin (LIPITOR) 40 MG tablet Take 40 mg by mouth every evening.  . Baclofen 5 MG TABS Take 5 mg by mouth 3 (three) times daily.  . bisacodyl (DULCOLAX) 10 MG suppository Place 1 suppository (10 mg total) rectally  daily as needed for moderate constipation.  . Cholecalciferol 1.25 MG (50000 UT) capsule Take 50,000 Units by mouth once a week. For Low Vitamin D Level on Monday  . coal tar (NEUTROGENA T-GEL) 0.5 % shampoo Apply 1 application topically See admin instructions. Apply to scalp on shower days twice a week Wed And Sat prn  . Dextromethorphan-guaiFENesin (ROBITUSSIN COUGH+CHEST CONG DM PO) Take 10 mLs by mouth every 6 (six) hours as needed.  . docusate (COLACE) 50 MG/5ML liquid Give 10 ml by mouth twice a day  . gabapentin (NEURONTIN) 100 MG capsule Take 100 mg by mouth at bedtime.  . levETIRAcetam (KEPPRA) 100 MG/ML solution Take 500 mg by mouth 2 (two) times daily. For Seizures Mix in Grape Juice  . LORazepam (ATIVAN) 2 MG/ML injection Inject 0.5 mLs (1 mg total) into the muscle every 15 (fifteen) minutes as needed. Up to 3 doses for uncontrolled seizure activity  . Menthol, Topical Analgesic, (BIOFREEZE) 4 % GEL Apply 1 application topically 2 (two) times daily. Apply to left shoulder  . metFORMIN (GLUCOPHAGE) 500 MG tablet Take by mouth 2 (two) times daily with a meal.  . metoprolol tartrate (LOPRESSOR) 25 MG tablet Take 12.5 mg by mouth 2 (two) times daily.  . NON FORMULARY Regular Diet  . omeprazole (PRILOSEC) 40 MG capsule Take 40 mg by mouth daily.   . sitaGLIPtin (JANUVIA) 100 MG tablet Take 100 mg by mouth daily.   No facility-administered encounter medications on file as of 12/04/2020.     SIGNIFICANT DIAGNOSTIC EXAMS   PREVIOUS:   08-24-18: ct of head: 1. 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 Increased volume loss of the infarcted brain as well as decreased herniation of brain and dura through the craniectomy defect. 2. No new intracranial abnormality.  NO NEW EXAMS.   LABS REVIEWED PREVIOUS:   01-09-20: vit D 43.63 02-06-20: urine micro-albumin: 27.4  03-13-20: glucose 116; bun 12; creat 0.67; k+ 3.8; na++ 136; ca 8.8 liver normal albumin 3.1  vit D 29.72 03-28-20: wbc 15.5; hgb 15.6; hct 51.7; mcv 93.5 plt clump; glucose 153; bun 16; creat 0.99; k+ 4.4; na++ 137; ca 9.6 tsh 0.811; hgb a1c 4.9;  03-29-20: chol 143 ldl 96 trig 83 hdl 30 HIV nr  04-06-20: wbc 10.9; hgb 12.7; hct 42.2; mcv 95.5 plt 171; glucose 91; bun 11; creat 0.76; k+ 4.7; na++ 140; ca 9.0 liver normal albumin 2.9 depakote 34  04-16-20: depakote 56   08-06-20: Vit D  40.07 09-10-20: glucose 163; bun 8; creat 0.60; k+ 3.7; na++ 138; ca 9.0 GFR>60; liver normal albumin 2.7; hgb a1c 7.5; chol 115; ldl 62; trig 108; hdl 31; tsh 1.233   NO NEW LABS.   Review  of Systems  Constitutional: Negative for malaise/fatigue.  Respiratory: Negative for cough and shortness of breath.   Cardiovascular: Negative for chest pain, palpitations and leg swelling.  Gastrointestinal: Negative for abdominal pain, constipation and heartburn.  Musculoskeletal: Negative for back pain, joint pain and myalgias.  Skin: Negative.   Neurological: Negative for dizziness.  Psychiatric/Behavioral: The patient is not nervous/anxious.    .   Physical Exam Constitutional:      General: He is not in acute distress.    Appearance: He is well-developed. He is not diaphoretic.  Neck:     Thyroid: No thyromegaly.  Cardiovascular:     Rate and Rhythm: Normal rate and regular rhythm.     Heart sounds: Normal heart sounds.  Pulmonary:     Effort: Pulmonary effort is normal. No respiratory distress.     Breath sounds: Normal breath sounds.  Abdominal:     General: Bowel sounds are normal. There is no distension.     Palpations: Abdomen is soft.     Tenderness: There is no abdominal tenderness.  Musculoskeletal:     Right lower leg: No edema.     Left lower leg: No edema.     Comments: Left hemiplegia with stiffness     Lymphadenopathy:     Cervical: No cervical adenopathy.  Skin:    General: Skin is warm and dry.  Neurological:     Mental Status: He is alert. Mental status is at baseline.   Psychiatric:        Mood and Affect: Mood normal.    ASSESSMENT/ PLAN:  TODAY;   1. Diabetic peripheral neuropathy is stable will continue gabapentin 200 mg nightly   2. Seizure: is stable no recent activity will continue keppra 500 mg twice daily is taking his medications  3. Chronic constipation: is stable will continue senna 2 tabs twice daily    PREVIOUS   4. Cerebrovascular accident (CVA) due to embolism of right middle cerebral artery is status post compression right frontotemporopartietal craniotomy with duraplasty 2018 is stable will continue eliquis 5 mg twice daily   5. Vit D deficiency: is stable level 40.07 will continue 50,000 units weekly   6. 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 GERD without esophagitis: is stable will continue prilosec 40 mg daily  8. AF (paroxsymal atrial fibrillation) heart rate is stable will continue lopressor 12.5 mg twice daily for rate control and is on eliquis 5 mg twice daily   9. Dyslipidemia associated with type 2 diabetes mellitus: is stable LDL 62 will lower lipitor to 40 mg daily  10. Hypertension associated with type 2 diabetes mellitus is stable b/p 130/74 will continue lopressor 12.5 mg twice daily   11. Type 2 diabetes mellitus with neurological complications: hgb W9N is up at 7.5 will continue januvia 100 mg daily will monitor   12. Protein calorie malnutrition severe: albumin 2.7 will continue prostat 30 mL three times daily         Ok Edwards NP [facility]  Contact [phone] Monday through Friday 8am- 5pm  After hours call [phone]

## 2020-12-24 ENCOUNTER — Encounter: Payer: Self-pay | Admitting: Adult Health

## 2020-12-24 ENCOUNTER — Non-Acute Institutional Stay (SKILLED_NURSING_FACILITY): Payer: Medicaid Other | Admitting: Adult Health

## 2020-12-24 DIAGNOSIS — I63411 Cerebral infarction due to embolism of right middle cerebral artery: Secondary | ICD-10-CM

## 2020-12-24 DIAGNOSIS — E1159 Type 2 diabetes mellitus with other circulatory complications: Secondary | ICD-10-CM

## 2020-12-24 DIAGNOSIS — I69354 Hemiplegia and hemiparesis following cerebral infarction affecting left non-dominant side: Secondary | ICD-10-CM | POA: Diagnosis not present

## 2020-12-24 DIAGNOSIS — I152 Hypertension secondary to endocrine disorders: Secondary | ICD-10-CM

## 2020-12-24 DIAGNOSIS — R569 Unspecified convulsions: Secondary | ICD-10-CM | POA: Diagnosis not present

## 2020-12-24 NOTE — Progress Notes (Signed)
Location:  [place] Room Number: 109 Place of Service:  SNF (31)   CODE STATUS: full code   Allergies  Allergen Reactions  . Cheese   . Penicillins Hives    Has patient had a PCN reaction causing immediate rash, facial/tongue/throat swelling, SOB or lightheadedness with hypotension: Unknown Has patient had a PCN reaction causing severe rash involving mucus membranes or skin necrosis: Unknown Has patient had a PCN reaction that required hospitalization: No Has patient had a PCN reaction occurring within the last 10 years: No If all of the above answers are "NO", then may proceed with Cephalosporin use.     Chief Complaint  Patient presents with  . Acute Visit    Care plan meeting     HPI:  We have come together for his care plan meeting   BIMS 15/15 mood 6/30: decreased energy; no sleeping well;. He rarely gets out of bed. He is nonambulatory; he is able to feed himself. There have been no falls.  He requires extensive assist to dependent for his adls. He is incontinent of bladder and bowel. He has been started on prostat for an albumin of 2.7. therapy  None at this time   Weight 175.4 pounds some loss since moving to [place]; regular diet appetite fair is on prostat three times daily.   He will continue to be followed for his chronic illnesses including: Seizure  Cerebrovascular accident (CVA) due to embolism of right middle artery   Hypertension associated with type 2 diabetes mellitus     Hemiparesis affecting left side as late effect of cerebrovascular accident (CVA)  Past Medical History:  Diagnosis Date  . DM (diabetes mellitus) ([place])   . Hypertension   . Stroke [facility])     Past Surgical History:  Procedure Laterality Date  . CRANIECTOMY Right 04/01/2017   Procedure: RIGHT DECOMPRESSIVE CRANIECTOMY;  Surgeon: Ditty, Kevan Ny, MD;  Location: [place];  Service: Neurosurgery;  Laterality: Right;  . ESOPHAGOGASTRODUODENOSCOPY N/A 04/13/2017    Procedure: ESOPHAGOGASTRODUODENOSCOPY (EGD);  Surgeon: Georganna Skeans, MD;  Location: [place];  Service: General;  Laterality: N/A;  bedside  . INCISION AND DRAINAGE PERIRECTAL ABSCESS N/A 09/28/2017   Procedure: IRRIGATION AND DEBRIDEMENT PERIANAL ABSCESS;  Surgeon: Virl Cagey, MD;  Location: [facility];  Service: General;  Laterality: N/A;  . PEG PLACEMENT N/A 04/13/2017   Procedure: PERCUTANEOUS ENDOSCOPIC GASTROSTOMY (PEG) PLACEMENT;  Surgeon: Georganna Skeans, MD;  Location: [facility] ENDOSCOPY;  Service: General;  Laterality: N/A;    Social History   Socioeconomic History  . Marital status: Unknown    Spouse name: Not on file  . Number of children: Not on file  . Years of education: Not on file  . Highest education level: Not on file  Occupational History  . Not on file  Tobacco Use  . Smoking status: Former Smoker    Types: Cigarettes  . Smokeless tobacco: Never Used  . Tobacco comment: UTA  Vaping Use  . Vaping Use: Never used  Substance and Sexual Activity  . Alcohol use: No    Comment: UTA  . Drug use: No    Comment: UTA  . Sexual activity: Not Currently    Birth control/protection: None  Other Topics Concern  . Not on file  Social History Narrative  . Not on file   Social Determinants of Health   Financial Resource Strain: Not on file  Food Insecurity: Not on file  Transportation Needs: Not on file  Physical Activity: Not on file  Stress: Not on file  Social Connections: Not on file  Intimate Partner Violence: Not on file   Family History  Problem Relation Age of Onset  . Hypertension Father       VITAL SIGNS BP 120/78   Pulse 68   Temp (!) 97 F (36.1 C)   Resp 20   Ht 5\' 6"  (1.676 m)   Wt 175 lb 6.4 oz (79.6 kg)   SpO2 95%   BMI 28.31 kg/m   Outpatient Encounter Medications as of 12/24/2020  Medication Sig  . acetaminophen (TYLENOL) 325 MG tablet Take 650 mg by mouth every 4 (four) hours as needed. Not to exceed 3 grams in 24 hours  .  Amino Acids-Protein Hydrolys (FEEDING SUPPLEMENT, PRO-STAT SUGAR FREE 64,) LIQD Take 30 mLs by mouth 3 (three) times daily with meals.  Marland Kitchen apixaban (ELIQUIS) 5 MG TABS tablet Take 5 mg by mouth 2 (two) times daily.   Marland Kitchen atorvastatin (LIPITOR) 40 MG tablet Take 40 mg by mouth every evening.  . Baclofen 5 MG TABS Take 5 mg by mouth 3 (three) times daily.  . bisacodyl (DULCOLAX) 10 MG suppository Place 1 suppository (10 mg total) rectally daily as needed for moderate constipation.  . Cholecalciferol 1.25 MG (50000 UT) capsule Take 50,000 Units by mouth once a week. For Low Vitamin D Level on Monday  . coal tar (NEUTROGENA T-GEL) 0.5 % shampoo Apply 1 application topically See admin instructions. Apply to scalp on shower days twice a week Wed And Sat prn  . Dextromethorphan-guaiFENesin (ROBITUSSIN COUGH+CHEST CONG DM PO) Take 10 mLs by mouth every 6 (six) hours as needed.  . docusate (COLACE) 50 MG/5ML liquid Give 10 ml by mouth twice a day  . gabapentin (NEURONTIN) 100 MG capsule Take 100 mg by mouth at bedtime.  . levETIRAcetam (KEPPRA) 100 MG/ML solution Take 500 mg by mouth 2 (two) times daily. For Seizures Mix in Grape Juice  . LORazepam (ATIVAN) 2 MG/ML injection Inject 0.5 mLs (1 mg total) into the muscle every 15 (fifteen) minutes as needed. Up to 3 doses for uncontrolled seizure activity  . Menthol, Topical Analgesic, (BIOFREEZE) 4 % GEL Apply 1 application topically 2 (two) times daily. Apply to left shoulder  . metFORMIN (GLUCOPHAGE) 500 MG tablet Take by mouth 2 (two) times daily with a meal.  . metoprolol tartrate (LOPRESSOR) 25 MG tablet Take 12.5 mg by mouth 2 (two) times daily.  . NON FORMULARY Regular Diet  . omeprazole (PRILOSEC) 40 MG capsule Take 40 mg by mouth daily.   . sitaGLIPtin (JANUVIA) 100 MG tablet Take 100 mg by mouth daily.   No facility-administered encounter medications on file as of 12/24/2020.     SIGNIFICANT DIAGNOSTIC EXAMS   PREVIOUS:   08-24-18: ct of  head: 1. 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 Increased volume loss of the infarcted brain as well as decreased herniation of brain and dura through the craniectomy defect. 2. No new intracranial abnormality.  NO NEW EXAMS.   LABS REVIEWED PREVIOUS:   01-09-20: vit D 43.63 02-06-20: urine micro-albumin: 27.4  03-13-20: glucose 116; bun 12; creat 0.67; k+ 3.8; na++ 136; ca 8.8 liver normal albumin 3.1 vit D 29.72 03-28-20: wbc 15.5; hgb 15.6; hct 51.7; mcv 93.5 plt clump; glucose 153; bun 16; creat 0.99; k+ 4.4; na++ 137; ca 9.6 tsh 0.811; hgb a1c 4.9;  03-29-20: chol 143 ldl 96 trig 83 hdl 30 HIV nr  04-06-20: wbc 10.9; hgb 12.7; hct 42.2; mcv 95.5 plt 171; glucose 91; bun 11; creat 0.76; k+ 4.7; na++ 140; ca 9.0 liver normal albumin 2.9 depakote 34  04-16-20: depakote 56   08-06-20: Vit D  40.07 09-10-20: glucose 163; bun 8; creat 0.60; k+ 3.7; na++ 138; ca 9.0 GFR>60; liver normal albumin 2.7; hgb a1c 7.5; chol 115; ldl 62; trig 108; hdl 31; tsh 1.233   NO NEW LABS.   Review of Systems  Constitutional: Negative for malaise/fatigue.  Respiratory: Negative for cough and shortness of breath.   Cardiovascular: Negative for chest pain, palpitations and leg swelling.  Gastrointestinal: Negative for abdominal pain, constipation and heartburn.  Musculoskeletal: Negative for back pain, joint pain and myalgias.  Skin: Negative.   Neurological: Negative for dizziness.  Psychiatric/Behavioral: The patient is not nervous/anxious.    Physical Exam Constitutional:      General: He is not in acute distress.    Appearance: He is well-developed. He is not diaphoretic.  Neck:     Thyroid: No thyromegaly.  Cardiovascular:     Rate and Rhythm: Normal rate and regular rhythm.     Pulses: Normal pulses.     Heart sounds: Normal heart sounds.  Pulmonary:     Effort: Pulmonary effort is normal. No respiratory distress.     Breath sounds: Normal breath sounds.  Abdominal:      General: Bowel sounds are normal. There is no distension.     Palpations: Abdomen is soft.     Tenderness: There is no abdominal tenderness.  Musculoskeletal:     Cervical back: Neck supple.     Right lower leg: No edema.     Left lower leg: No edema.     Comments:  Left hemiplegia with stiffness      Lymphadenopathy:     Cervical: No cervical adenopathy.  Skin:    General: Skin is warm and dry.  Neurological:     Mental Status: He is alert. Mental status is at baseline.  Psychiatric:        Mood and Affect: Mood normal.      ASSESSMENT/ PLAN:  TODAY  1. Seizure 2. Cerebrovascular accident (CVA) due to embolism of right middle artery 3. Hypertension associated with type 2 diabetes mellitus  4.  Hemiparesis affecting left side as late effect of cerebrovascular accident (CVA)  Will continue current medications Will continue current plan of care Will continue to monitor his status.   Time spent with patient: 40 minutes: coordination of care; medications; goals of care; medical diagnosis.    Ok Edwards NP [facility] Adult [facility]  Contact [phone] Monday through Friday 8am- 5pm  After hours call [phone]

## 2021-01-05 ENCOUNTER — Non-Acute Institutional Stay (SKILLED_NURSING_FACILITY): Payer: Medicaid Other | Admitting: Adult Health

## 2021-01-05 ENCOUNTER — Encounter: Payer: Self-pay | Admitting: Adult Health

## 2021-01-05 DIAGNOSIS — I63411 Cerebral infarction due to embolism of right middle cerebral artery: Secondary | ICD-10-CM

## 2021-01-05 DIAGNOSIS — E559 Vitamin D deficiency, unspecified: Secondary | ICD-10-CM | POA: Diagnosis not present

## 2021-01-05 DIAGNOSIS — K219 Gastro-esophageal reflux disease without esophagitis: Secondary | ICD-10-CM | POA: Diagnosis not present

## 2021-01-05 DIAGNOSIS — I69354 Hemiplegia and hemiparesis following cerebral infarction affecting left non-dominant side: Secondary | ICD-10-CM | POA: Diagnosis not present

## 2021-01-05 NOTE — Progress Notes (Signed)
Location:  [place] Room Number: 152-D Place of Service:  SNF (31)   CODE STATUS:Full Code   Allergies  Allergen Reactions  . Cheese   . Penicillins Hives    Has patient had a PCN reaction causing immediate rash, facial/tongue/throat swelling, SOB or lightheadedness with hypotension: Unknown Has patient had a PCN reaction causing severe rash involving mucus membranes or skin necrosis: Unknown Has patient had a PCN reaction that required hospitalization: No Has patient had a PCN reaction occurring within the last 10 years: No If all of the above answers are "NO", then may proceed with Cephalosporin use.     Chief Complaint  Patient presents with  . Medical Management of Chronic Issues           Cerebrovascular accident (CVA) due to embolism of right middle cerebral artery:   Vitamin D deficiency:     Hemiplegia affecting left side as late effect of cerebrovascular accident (CVA)   GERD without esophagitis:    HPI:  He is a 60 year old long term resident of this facility being seen for the management of his chronic illnesses;Cerebrovascular accident (CVA) due to embolism of right middle cerebral artery:   Vitamin D deficiency:     Hemiplegia affecting left side as late effect of cerebrovascular accident (CVA)   GERD without esophagitis. There are no reports of changes in appetite; no anxiety; no uncontrolled pain.    Past Medical History:  Diagnosis Date  . DM (diabetes mellitus) ([place])   . Hypertension   . Stroke [facility])     Past Surgical History:  Procedure Laterality Date  . CRANIECTOMY Right 04/01/2017   Procedure: RIGHT DECOMPRESSIVE CRANIECTOMY;  Surgeon: Ditty, Kevan Ny, MD;  Location: [place];  Service: Neurosurgery;  Laterality: Right;  . ESOPHAGOGASTRODUODENOSCOPY N/A 04/13/2017   Procedure: ESOPHAGOGASTRODUODENOSCOPY (EGD);  Surgeon: Georganna Skeans, MD;  Location: [place];  Service: General;  Laterality: N/A;  bedside  . INCISION AND  DRAINAGE PERIRECTAL ABSCESS N/A 09/28/2017   Procedure: IRRIGATION AND DEBRIDEMENT PERIANAL ABSCESS;  Surgeon: Virl Cagey, MD;  Location: [facility];  Service: General;  Laterality: N/A;  . PEG PLACEMENT N/A 04/13/2017   Procedure: PERCUTANEOUS ENDOSCOPIC GASTROSTOMY (PEG) PLACEMENT;  Surgeon: Georganna Skeans, MD;  Location: [facility] ENDOSCOPY;  Service: General;  Laterality: N/A;    Social History   Socioeconomic History  . Marital status: Unknown    Spouse name: Not on file  . Number of children: Not on file  . Years of education: Not on file  . Highest education level: Not on file  Occupational History  . Not on file  Tobacco Use  . Smoking status: Former Smoker    Types: Cigarettes  . Smokeless tobacco: Never Used  . Tobacco comment: UTA  Vaping Use  . Vaping Use: Never used  Substance and Sexual Activity  . Alcohol use: No    Comment: UTA  . Drug use: No    Comment: UTA  . Sexual activity: Not Currently    Birth control/protection: None  Other Topics Concern  . Not on file  Social History Narrative  . Not on file   Social Determinants of Health   Financial Resource Strain: Not on file  Food Insecurity: Not on file  Transportation Needs: Not on file  Physical Activity: Not on file  Stress: Not on file  Social Connections: Not on file  Intimate Partner Violence: Not on file   Family History  Problem Relation Age of Onset  .  Hypertension Father       VITAL SIGNS BP 136/69   Pulse 78   Temp 98.6 F (37 C)   Resp 20   Ht 5\' 6"  (1.676 m)   Wt 175 lb 6.4 oz (79.6 kg)   SpO2 95%   BMI 28.31 kg/m   Outpatient Encounter Medications as of 01/05/2021  Medication Sig  . acetaminophen (TYLENOL) 325 MG tablet Take 650 mg by mouth every 4 (four) hours as needed. Not to exceed 3 grams in 24 hours  . Amino Acids-Protein Hydrolys (FEEDING SUPPLEMENT, PRO-STAT SUGAR FREE 64,) LIQD Take 30 mLs by mouth 3 (three) times daily with meals.  Marland Kitchen apixaban (ELIQUIS) 5 MG TABS  tablet Take 5 mg by mouth 2 (two) times daily.   Marland Kitchen atorvastatin (LIPITOR) 40 MG tablet Take 40 mg by mouth every evening.  . Baclofen 5 MG TABS Take 5 mg by mouth 3 (three) times daily.  . bisacodyl (DULCOLAX) 10 MG suppository Place 1 suppository (10 mg total) rectally daily as needed for moderate constipation.  . Cholecalciferol 1.25 MG (50000 UT) capsule Take 50,000 Units by mouth once a week. For Low Vitamin D Level on Monday  . coal tar (NEUTROGENA T-GEL) 0.5 % shampoo Apply 1 application topically See admin instructions. Apply to scalp on shower days twice a week Wed And Sat prn  . Dextromethorphan-guaiFENesin (ROBITUSSIN COUGH+CHEST CONG DM PO) Take 10 mLs by mouth every 6 (six) hours as needed.  . docusate (COLACE) 50 MG/5ML liquid Give 10 ml by mouth twice a day  . gabapentin (NEURONTIN) 100 MG capsule Take 100 mg by mouth at bedtime.  . levETIRAcetam (KEPPRA) 100 MG/ML solution Take 500 mg by mouth 2 (two) times daily. For Seizures Mix in Grape Juice  . LORazepam (ATIVAN) 2 MG/ML injection Inject 0.5 mLs (1 mg total) into the muscle every 15 (fifteen) minutes as needed. Up to 3 doses for uncontrolled seizure activity  . Menthol, Topical Analgesic, (BIOFREEZE) 4 % GEL Apply 1 application topically 2 (two) times daily. Apply to left shoulder  . metFORMIN (GLUCOPHAGE) 500 MG tablet Take by mouth 2 (two) times daily with a meal.  . metoprolol tartrate (LOPRESSOR) 25 MG tablet Take 12.5 mg by mouth 2 (two) times daily.  . NON FORMULARY Regular Diet  . omeprazole (PRILOSEC) 40 MG capsule Take 40 mg by mouth daily.   . sitaGLIPtin (JANUVIA) 100 MG tablet Take 100 mg by mouth daily.   No facility-administered encounter medications on file as of 01/05/2021.     SIGNIFICANT DIAGNOSTIC EXAMS   PREVIOUS:   08-24-18: ct of head: 1. 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 Increased volume loss of the infarcted brain as well as decreased herniation of brain and  dura through the craniectomy defect. 2. No new intracranial abnormality.  NO NEW EXAMS.   LABS REVIEWED PREVIOUS:   01-09-20: vit D 43.63 02-06-20: urine micro-albumin: 27.4  03-13-20: glucose 116; bun 12; creat 0.67; k+ 3.8; na++ 136; ca 8.8 liver normal albumin 3.1 vit D 29.72 03-28-20: wbc 15.5; hgb 15.6; hct 51.7; mcv 93.5 plt clump; glucose 153; bun 16; creat 0.99; k+ 4.4; na++ 137; ca 9.6 tsh 0.811; hgb a1c 4.9;  03-29-20: chol 143 ldl 96 trig 83 hdl 30 HIV nr  04-06-20: wbc 10.9; hgb 12.7; hct 42.2; mcv 95.5 plt 171; glucose 91; bun 11; creat 0.76; k+ 4.7; na++ 140; ca 9.0 liver normal albumin 2.9 depakote 34  04-16-20: depakote 56  08-06-20: Vit D  40.07 09-10-20: glucose 163; bun 8; creat 0.60; k+ 3.7; na++ 138; ca 9.0 GFR>60; liver normal albumin 2.7; hgb a1c 7.5; chol 115; ldl 62; trig 108; hdl 31; tsh 1.233   NO NEW LABS.   Review of Systems  Constitutional: Negative for malaise/fatigue.  Respiratory: Negative for cough and shortness of breath.   Cardiovascular: Negative for chest pain, palpitations and leg swelling.  Gastrointestinal: Negative for abdominal pain, constipation and heartburn.  Musculoskeletal: Negative for back pain, joint pain and myalgias.  Skin: Negative.   Neurological: Negative for dizziness.  Psychiatric/Behavioral: The patient is not nervous/anxious.       Physical Exam Constitutional:      General: He is not in acute distress.    Appearance: He is well-developed. He is not diaphoretic.  Neck:     Thyroid: No thyromegaly.  Cardiovascular:     Rate and Rhythm: Normal rate and regular rhythm.     Pulses: Normal pulses.     Heart sounds: Normal heart sounds.  Pulmonary:     Effort: Pulmonary effort is normal. No respiratory distress.     Breath sounds: Normal breath sounds.  Abdominal:     General: Bowel sounds are normal. There is no distension.     Palpations: Abdomen is soft.     Tenderness: There is no abdominal tenderness.  Musculoskeletal:      Cervical back: Neck supple.     Right lower leg: No edema.     Left lower leg: No edema.     Comments: Left hemiplegia with stiffness      Lymphadenopathy:     Cervical: No cervical adenopathy.  Skin:    General: Skin is warm and dry.  Neurological:     Mental Status: He is alert. Mental status is at baseline.  Psychiatric:        Mood and Affect: Mood normal.    ASSESSMENT/ PLAN:  TODAY;   1. Cerebrovascular accident (CVA) due to embolism of right middle cerebral artery: is status post compression of right frontotemporopartietal craniotomy with duraplasty 2018: is stable will continue eliquids 5 mg twice daily   2. Vitamin D deficiency: is stable level 40.07 will continue 50,000 units weekly   3. Hemiplegia affecting left side as late effect of cerebrovascular accident (CVA) is stable will continue baclofen 5 mg three times daily for spasticity and biofreeze to left shoulder twice daily   4. GERD without esophagitis: is stable will continue prilosec 40 mg daily    PREVIOUS   5. AF (paroxsymal atrial fibrillation) heart rate is stable will continue lopressor 12.5 mg twice daily for rate control and is on eliquis 5 mg twice daily   6. Dyslipidemia associated with type 2 diabetes mellitus: is stable LDL 62 will continue lipitor  40 mg daily  7. Hypertension associated with type 2 diabetes mellitus is stable b/p 130/74 will continue lopressor 12.5 mg twice daily   8. Type 2 diabetes mellitus with neurological complications: hgb I7O is up at 7.5 will continue januvia 100 mg daily will monitor   9. Protein calorie malnutrition severe: albumin 2.7 will continue prostat 30 mL three times daily   10. Diabetic peripheral neuropathy is stable will continue gabapentin 200 mg nightly   11. Seizure: is stable no recent activity will continue keppra 500 mg twice daily  12. Chronic constipation: is stable will continue senna 2 tabs twice daily     Ok Edwards NP [facility] Adult  Medicine  Contact  [phone] Monday through Friday 8am- 5pm  After hours call [phone]

## 2021-01-08 ENCOUNTER — Other Ambulatory Visit (HOSPITAL_COMMUNITY)
Admission: RE | Admit: 2021-01-08 | Discharge: 2021-01-08 | Disposition: A | Discharge status: Home / Self Care | Payer: Medicaid Other | Source: Skilled Nursing Facility | Attending: Adult Health | Admitting: Adult Health

## 2021-01-08 DIAGNOSIS — E1140 Type 2 diabetes mellitus with diabetic neuropathy, unspecified: Secondary | ICD-10-CM | POA: Diagnosis present

## 2021-01-08 LAB — COMPREHENSIVE METABOLIC PANEL
ALT: 12 U/L (ref 0–44)
AST: 9 U/L — ABNORMAL LOW (ref 15–41)
Albumin: 2.9 g/dL — ABNORMAL LOW (ref 3.5–5.0)
Alkaline Phosphatase: 65 U/L (ref 38–126)
Anion gap: 6 (ref 5–15)
BUN: 11 mg/dL (ref 6–20)
CO2: 24 mmol/L (ref 22–32)
Calcium: 8.9 mg/dL (ref 8.9–10.3)
Chloride: 109 mmol/L (ref 98–111)
Creatinine, Ser: 0.62 mg/dL (ref 0.61–1.24)
GFR, Estimated: >60 mL/min (ref >60)
Glucose, Bld: 99 mg/dL (ref 70–99)
Potassium: 3.7 mmol/L (ref 3.5–5.1)
Sodium: 139 mmol/L (ref 135–145)
Total Bilirubin: 0.5 mg/dL (ref 0.3–1.2)
Total Protein: 6.7 g/dL (ref 6.5–8.1)

## 2021-01-08 LAB — CBC
HCT: 37.7 % — ABNORMAL LOW (ref 39.0–52.0)
Hemoglobin: 10.8 g/dL — ABNORMAL LOW (ref 13.0–17.0)
MCH: 25.2 pg — ABNORMAL LOW (ref 26.0–34.0)
MCHC: 28.6 g/dL — ABNORMAL LOW (ref 30.0–36.0)
MCV: 87.9 fL (ref 80.0–100.0)
Platelets: 208 10*3/uL (ref 150–400)
RBC: 4.29 MIL/uL (ref 4.22–5.81)
RDW: 16.2 % — ABNORMAL HIGH (ref 11.5–15.5)
WBC: 11.9 10*3/uL — ABNORMAL HIGH (ref 4.0–10.5)
nRBC: 0 % (ref 0.0–0.2)

## 2021-01-09 LAB — HEMOGLOBIN A1C
Hgb A1c MFr Bld: 5.1 % (ref 4.8–5.6)
Mean Plasma Glucose: 100 mg/dL

## 2021-01-12 ENCOUNTER — Encounter: Payer: Self-pay | Admitting: Adult Health

## 2021-01-12 NOTE — Progress Notes (Signed)
Location:  [place] Room Number: 152-D Place of Service:  SNF (31)   CODE STATUS: DNR  Allergies  Allergen Reactions  . Cheese   . Penicillins Hives    Has patient had a PCN reaction causing immediate rash, facial/tongue/throat swelling, SOB or lightheadedness with hypotension: Unknown Has patient had a PCN reaction causing severe rash involving mucus membranes or skin necrosis: Unknown Has patient had a PCN reaction that required hospitalization: No Has patient had a PCN reaction occurring within the last 10 years: No If all of the above answers are "NO", then may proceed with Cephalosporin use.     Chief Complaint  Patient presents with  . Acute Visit    Diabetic concerns     HPI:    Past Medical History:  Diagnosis Date  . DM (diabetes mellitus) ([place])   . Hypertension   . Stroke [facility]     Past Surgical History:  Procedure Laterality Date  . CRANIECTOMY Right 04/01/2017   Procedure: RIGHT DECOMPRESSIVE CRANIECTOMY;  Surgeon: Ditty, Kevan Ny, MD;  Location: [place];  Service: Neurosurgery;  Laterality: Right;  . ESOPHAGOGASTRODUODENOSCOPY N/A 04/13/2017   Procedure: ESOPHAGOGASTRODUODENOSCOPY (EGD);  Surgeon: Georganna Skeans, MD;  Location: [place];  Service: General;  Laterality: N/A;  bedside  . INCISION AND DRAINAGE PERIRECTAL ABSCESS N/A 09/28/2017   Procedure: IRRIGATION AND DEBRIDEMENT PERIANAL ABSCESS;  Surgeon: Virl Cagey, MD;  Location: [facility];  Service: General;  Laterality: N/A;  . PEG PLACEMENT N/A 04/13/2017   Procedure: PERCUTANEOUS ENDOSCOPIC GASTROSTOMY (PEG) PLACEMENT;  Surgeon: Georganna Skeans, MD;  Location: [facility] ENDOSCOPY;  Service: General;  Laterality: N/A;    Social History   Socioeconomic History  . Marital status: Unknown    Spouse name: Not on file  . Number of children: Not on file  . Years of education: Not on file  . Highest education level: Not on file  Occupational History  . Not on file   Tobacco Use  . Smoking status: Former Smoker    Types: Cigarettes  . Smokeless tobacco: Never Used  . Tobacco comment: UTA  Vaping Use  . Vaping Use: Never used  Substance and Sexual Activity  . Alcohol use: No    Comment: UTA  . Drug use: No    Comment: UTA  . Sexual activity: Not Currently    Birth control/protection: None  Other Topics Concern  . Not on file  Social History Narrative  . Not on file   Social Determinants of Health   Financial Resource Strain: Not on file  Food Insecurity: Not on file  Transportation Needs: Not on file  Physical Activity: Not on file  Stress: Not on file  Social Connections: Not on file  Intimate Partner Violence: Not on file   Family History  Problem Relation Age of Onset  . Hypertension Father       VITAL SIGNS BP 125/73   Pulse 80   Temp 98.4 F (36.9 C)   Ht 5\' 6"  (1.676 m)   Wt 173 lb (78.5 kg)   SpO2 95%   BMI 27.92 kg/m   Outpatient Encounter Medications as of 01/12/2021  Medication Sig  . acetaminophen (TYLENOL) 325 MG tablet Take 650 mg by mouth every 4 (four) hours as needed. Not to exceed 3 grams in 24 hours  . Amino Acids-Protein Hydrolys (FEEDING SUPPLEMENT, PRO-STAT SUGAR FREE 64,) LIQD Take 30 mLs by mouth 3 (three) times daily with meals.  Marland Kitchen apixaban (ELIQUIS) 5 MG  TABS tablet Take 5 mg by mouth 2 (two) times daily.   Marland Kitchen atorvastatin (LIPITOR) 40 MG tablet Take 40 mg by mouth every evening.  . baclofen (LIORESAL) 10 MG tablet Take 5 mg by mouth 3 (three) times daily.  . bisacodyl (DULCOLAX) 10 MG suppository Place 1 suppository (10 mg total) rectally daily as needed for moderate constipation.  . Cholecalciferol 1.25 MG (50000 UT) capsule Take 50,000 Units by mouth once a week. For Low Vitamin D Level on Monday  . coal tar (NEUTROGENA T-GEL) 0.5 % shampoo Apply 1 application topically See admin instructions. Apply to scalp on shower days twice a week Wed And Sat prn  . Dextromethorphan-guaiFENesin (ROBITUSSIN  COUGH+CHEST CONG DM PO) Take 10 mLs by mouth every 6 (six) hours as needed.  . docusate (COLACE) 50 MG/5ML liquid Give 10 ml by mouth twice a day  . gabapentin (NEURONTIN) 100 MG capsule Take 100 mg by mouth at bedtime.  . levETIRAcetam (KEPPRA) 100 MG/ML solution Take 500 mg by mouth 2 (two) times daily. For Seizures Mix in Grape Juice  . LORazepam (ATIVAN) 2 MG/ML injection Inject 0.5 mLs (1 mg total) into the muscle every 15 (fifteen) minutes as needed. Up to 3 doses for uncontrolled seizure activity  . Menthol, Topical Analgesic, (BIOFREEZE) 4 % GEL Apply 1 application topically 2 (two) times daily. Apply to left shoulder  . metFORMIN (GLUCOPHAGE) 500 MG tablet Take by mouth 2 (two) times daily with a meal.  . metoprolol tartrate (LOPRESSOR) 25 MG tablet Take 12.5 mg by mouth 2 (two) times daily.  . NON FORMULARY Regular Diet  . omeprazole (PRILOSEC) 40 MG capsule Take 40 mg by mouth daily.   . sitaGLIPtin (JANUVIA) 100 MG tablet Take 100 mg by mouth daily.  . [DISCONTINUED] Baclofen 5 MG TABS Take 5 mg by mouth 3 (three) times daily.   No facility-administered encounter medications on file as of 01/12/2021.     SIGNIFICANT DIAGNOSTIC EXAMS       ASSESSMENT/ PLAN:     Ok Edwards NP [facility] Adult Medicine  Contact [phone] Monday through Friday 8am- 5pm  After hours call [phone]

## 2021-01-26 NOTE — Progress Notes (Signed)
This encounter was created in error - please disregard.  This encounter was created in error - please disregard.

## 2021-02-17 ENCOUNTER — Encounter: Payer: Self-pay | Admitting: Adult Health

## 2021-02-17 ENCOUNTER — Non-Acute Institutional Stay (SKILLED_NURSING_FACILITY): Payer: Medicaid Other | Admitting: Adult Health

## 2021-02-17 DIAGNOSIS — E1149 Type 2 diabetes mellitus with other diabetic neurological complication: Secondary | ICD-10-CM

## 2021-02-17 DIAGNOSIS — E1169 Type 2 diabetes mellitus with other specified complication: Secondary | ICD-10-CM

## 2021-02-17 DIAGNOSIS — I480 Paroxysmal atrial fibrillation: Secondary | ICD-10-CM

## 2021-02-17 DIAGNOSIS — E1159 Type 2 diabetes mellitus with other circulatory complications: Secondary | ICD-10-CM

## 2021-02-17 DIAGNOSIS — E785 Hyperlipidemia, unspecified: Secondary | ICD-10-CM

## 2021-02-17 DIAGNOSIS — I152 Hypertension secondary to endocrine disorders: Secondary | ICD-10-CM

## 2021-02-17 NOTE — Progress Notes (Signed)
Location:  [place] Room Number: 152-D Place of Service:  SNF (31)   CODE STATUS: DNR  Allergies  Allergen Reactions  . Cheese   . Penicillins Hives    Has patient had a PCN reaction causing immediate rash, facial/tongue/throat swelling, SOB or lightheadedness with hypotension: Unknown Has patient had a PCN reaction causing severe rash involving mucus membranes or skin necrosis: Unknown Has patient had a PCN reaction that required hospitalization: No Has patient had a PCN reaction occurring within the last 10 years: No If all of the above answers are "NO", then may proceed with Cephalosporin use.     Chief Complaint  Patient presents with  . Medical Management of Chronic Issues         AF (paroxsymal atrial fibrillation)   Dyslipidemia associated with type 2 diabetes mellitus:  Hypertension associated with type 2 diabetes mellitus    Type 2 diabetes mellitus with neurological complications    HPI:  He is a 60 year old long term resident of this facility being seen for the management of his chronic illnesses: AF (paroxsymal atrial fibrillation)   Dyslipidemia associated with type 2 diabetes mellitus:  Hypertension associated with type 2 diabetes mellitus    Type 2 diabetes mellitus with neurological complications. There are no reports of uncontrolled pain; no changes in appetite; no reports of insomnia; anxiety of depressive thoughts.   Past Medical History:  Diagnosis Date  . DM (diabetes mellitus) ([place])   . Hypertension   . Stroke [facility])     Past Surgical History:  Procedure Laterality Date  . CRANIECTOMY Right 04/01/2017   Procedure: RIGHT DECOMPRESSIVE CRANIECTOMY;  Surgeon: Ditty, Kevan Ny, MD;  Location: [place];  Service: Neurosurgery;  Laterality: Right;  . ESOPHAGOGASTRODUODENOSCOPY N/A 04/13/2017   Procedure: ESOPHAGOGASTRODUODENOSCOPY (EGD);  Surgeon: Georganna Skeans, MD;  Location: [place];  Service: General;  Laterality: N/A;   bedside  . INCISION AND DRAINAGE PERIRECTAL ABSCESS N/A 09/28/2017   Procedure: IRRIGATION AND DEBRIDEMENT PERIANAL ABSCESS;  Surgeon: Virl Cagey, MD;  Location: [facility];  Service: General;  Laterality: N/A;  . PEG PLACEMENT N/A 04/13/2017   Procedure: PERCUTANEOUS ENDOSCOPIC GASTROSTOMY (PEG) PLACEMENT;  Surgeon: Georganna Skeans, MD;  Location: [facility] ENDOSCOPY;  Service: General;  Laterality: N/A;    Social History   Socioeconomic History  . Marital status: Unknown    Spouse name: Not on file  . Number of children: Not on file  . Years of education: Not on file  . Highest education level: Not on file  Occupational History  . Not on file  Tobacco Use  . Smoking status: Former    Pack years: 0.00    Types: Cigarettes  . Smokeless tobacco: Never  . Tobacco comments:    UTA  Vaping Use  . Vaping Use: Never used  Substance and Sexual Activity  . Alcohol use: No    Comment: UTA  . Drug use: No    Comment: UTA  . Sexual activity: Not Currently    Birth control/protection: None  Other Topics Concern  . Not on file  Social History Narrative  . Not on file   Social Determinants of Health   Financial Resource Strain: Not on file  Food Insecurity: Not on file  Transportation Needs: Not on file  Physical Activity: Not on file  Stress: Not on file  Social Connections: Not on file  Intimate Partner Violence: Not on file   Family History  Problem Relation Age of Onset  .  Hypertension Father       VITAL SIGNS BP 140/71   Pulse 78   Temp (!) 97.3 F (36.3 C)   Resp 20   Ht 5\' 6"  (1.676 m)   Wt 174 lb (78.9 kg)   SpO2 95%   BMI 28.08 kg/m   Outpatient Encounter Medications as of 02/17/2021  Medication Sig  . acetaminophen (TYLENOL) 325 MG tablet Take 650 mg by mouth every 4 (four) hours as needed. Not to exceed 3 grams in 24 hours  . Amino Acids-Protein Hydrolys (FEEDING SUPPLEMENT, PRO-STAT SUGAR FREE 64,) LIQD Take 30 mLs by mouth 3 (three) times daily with  meals.  Marland Kitchen apixaban (ELIQUIS) 5 MG TABS tablet Take 5 mg by mouth 2 (two) times daily.   Marland Kitchen atorvastatin (LIPITOR) 40 MG tablet Take 40 mg by mouth every evening.  . baclofen (LIORESAL) 10 MG tablet Take 5 mg by mouth 3 (three) times daily.  . bisacodyl (DULCOLAX) 10 MG suppository Place 1 suppository (10 mg total) rectally daily as needed for moderate constipation.  . Cholecalciferol 1.25 MG (50000 UT) capsule Take 50,000 Units by mouth once a week. For Low Vitamin D Level on Monday  . Dextromethorphan-guaiFENesin (ROBITUSSIN COUGH+CHEST CONG DM PO) Take 10 mLs by mouth every 6 (six) hours as needed.  . docusate (COLACE) 50 MG/5ML liquid Give 10 ml by mouth twice a day  . gabapentin (NEURONTIN) 100 MG capsule Take 100 mg by mouth at bedtime.  . levETIRAcetam (KEPPRA) 100 MG/ML solution Take 500 mg by mouth 2 (two) times daily. For Seizures Mix in Grape Juice  . LORazepam (ATIVAN) 2 MG/ML injection Inject 0.5 mLs (1 mg total) into the muscle every 15 (fifteen) minutes as needed. Up to 3 doses for uncontrolled seizure activity  . Menthol, Topical Analgesic, (BIOFREEZE) 4 % GEL Apply 1 application topically 2 (two) times daily. Apply to left shoulder  . metFORMIN (GLUCOPHAGE) 500 MG tablet Take by mouth 2 (two) times daily with a meal.  . metoprolol tartrate (LOPRESSOR) 25 MG tablet Take 12.5 mg by mouth 2 (two) times daily.  . NON FORMULARY Regular Diet  . omeprazole (PRILOSEC) 40 MG capsule Take 40 mg by mouth daily.   . sitaGLIPtin (JANUVIA) 100 MG tablet Take 100 mg by mouth daily.  . [DISCONTINUED] coal tar (NEUTROGENA T-GEL) 0.5 % shampoo Apply 1 application topically See admin instructions. Apply to scalp on shower days twice a week Wed And Sat prn   No facility-administered encounter medications on file as of 02/17/2021.     SIGNIFICANT DIAGNOSTIC EXAMS   PREVIOUS:   08-24-18: ct of head: 1. 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 Increased volume  loss of the infarcted brain as well as decreased herniation of brain and dura through the craniectomy defect. 2. No new intracranial abnormality.  NO NEW EXAMS.   LABS REVIEWED PREVIOUS:   02-06-20: urine micro-albumin: 27.4  03-13-20: glucose 116; bun 12; creat 0.67; k+ 3.8; na++ 136; ca 8.8 liver normal albumin 3.1 vit D 29.72 03-28-20: wbc 15.5; hgb 15.6; hct 51.7; mcv 93.5 plt clump; glucose 153; bun 16; creat 0.99; k+ 4.4; na++ 137; ca 9.6 tsh 0.811; hgb a1c 4.9;  03-29-20: chol 143 ldl 96 trig 83 hdl 30 HIV nr  04-06-20: wbc 10.9; hgb 12.7; hct 42.2; mcv 95.5 plt 171; glucose 91; bun 11; creat 0.76; k+ 4.7; na++ 140; ca 9.0 liver normal albumin 2.9 depakote 34  04-16-20: depakote 56   08-06-20: Vit D  40.07 09-10-20: glucose 163; bun 8; creat 0.60; k+ 3.7; na++ 138; ca 9.0 GFR>60; liver normal albumin 2.7; hgb a1c 7.5; chol 115; ldl 62; trig 108; hdl 31; tsh 1.233   TODAY  01-28-21: wbc 11.9; hgb 10.8; hct 37.7; mcv 87.9 plt 208; glucose 99; bun 11; creat 0.63; k+ 3.7; na++ 139; ca 8.9; GFR >60; liver normal albumin 2.9; hgb a1c 5.1    Review of Systems  Constitutional:  Negative for malaise/fatigue.  Respiratory:  Negative for cough and shortness of breath.   Cardiovascular:  Negative for chest pain, palpitations and leg swelling.  Gastrointestinal:  Negative for abdominal pain, constipation and heartburn.  Musculoskeletal:  Negative for back pain, joint pain and myalgias.  Skin: Negative.   Neurological:  Negative for dizziness.  Psychiatric/Behavioral:  The patient is not nervous/anxious.    Physical Exam Constitutional:      General: He is not in acute distress.    Appearance: He is well-developed. He is not diaphoretic.  Neck:     Thyroid: No thyromegaly.  Cardiovascular:     Rate and Rhythm: Normal rate and regular rhythm.     Pulses: Normal pulses.     Heart sounds: Normal heart sounds.  Pulmonary:     Effort: Pulmonary effort is normal. No respiratory distress.     Breath  sounds: Normal breath sounds.  Abdominal:     General: Bowel sounds are normal. There is no distension.     Palpations: Abdomen is soft.     Tenderness: There is no abdominal tenderness.  Musculoskeletal:     Cervical back: Neck supple.     Right lower leg: No edema.     Left lower leg: No edema.     Comments: Left hemiplegia with stiffness  Lymphadenopathy:     Cervical: No cervical adenopathy.  Skin:    General: Skin is warm and dry.  Neurological:     Mental Status: He is alert. Mental status is at baseline.  Psychiatric:        Mood and Affect: Mood normal.    ASSESSMENT/ PLAN:  TODAY;   AF (paroxsymal atrial fibrillation) heart rate is stable will continue lopressor 12.5 mg twice daily for rate control and eliquis 5 mg twice daily   2. Dyslipidemia associated with type 2 diabetes mellitus: is stable LDL 62 will continue lipitor 40 mg daily   3. Hypertension associated with type 2 diabetes mellitus is stable b/p 140/71 will continue lopressor 12.5 mg twice daily   4. Type 2 diabetes mellitus with neurological complications: is stable hgb a1c improved to 5.1. will continue januvia 100 mg daily    PREVIOUS   5. Protein calorie malnutrition severe: albumin 2.7 will continue prostat 30 mL three times daily   6. Diabetic peripheral neuropathy is stable will continue gabapentin 200 mg nightly   7. Seizure: is stable no recent activity will continue keppra 500 mg twice daily  8. Chronic constipation: is stable will continue senna 2 tabs twice daily   9. Cerebrovascular accident (CVA) due to embolism of right middle cerebral artery: is status post compression of right frontotemporopartietal craniotomy with duraplasty 2018: is stable will continue eliquids 5 mg twice daily   10. Vitamin D deficiency: is stable level 40.07 will continue 50,000 units weekly   11. Hemiplegia affecting left side as late effect of cerebrovascular accident (CVA) is stable will continue baclofen 5  mg three times daily for spasticity and biofreeze to left shoulder twice daily  12. GERD without esophagitis: is stable will continue prilosec 40 mg daily         Ok Edwards NP [facility]  Contact [phone] Monday through Friday 8am- 5pm  After hours call [phone]

## 2021-03-25 ENCOUNTER — Non-Acute Institutional Stay (SKILLED_NURSING_FACILITY): Payer: Medicaid Other | Admitting: Adult Health

## 2021-03-25 ENCOUNTER — Encounter: Payer: Self-pay | Admitting: Adult Health

## 2021-03-25 DIAGNOSIS — I69354 Hemiplegia and hemiparesis following cerebral infarction affecting left non-dominant side: Secondary | ICD-10-CM | POA: Diagnosis not present

## 2021-03-25 DIAGNOSIS — R569 Unspecified convulsions: Secondary | ICD-10-CM

## 2021-03-25 DIAGNOSIS — E1142 Type 2 diabetes mellitus with diabetic polyneuropathy: Secondary | ICD-10-CM | POA: Diagnosis not present

## 2021-03-25 DIAGNOSIS — E43 Unspecified severe protein-calorie malnutrition: Secondary | ICD-10-CM | POA: Diagnosis not present

## 2021-03-25 NOTE — Progress Notes (Addendum)
Location:  [place] Room Number: 152 Place of Service:  SNF (31)   CODE STATUS: dnr   Allergies  Allergen Reactions   Cheese    Penicillins Hives    Has patient had a PCN reaction causing immediate rash, facial/tongue/throat swelling, SOB or lightheadedness with hypotension: Unknown Has patient had a PCN reaction causing severe rash involving mucus membranes or skin necrosis: Unknown Has patient had a PCN reaction that required hospitalization: No Has patient had a PCN reaction occurring within the last 10 years: No If all of the above answers are "NO", then may proceed with Cephalosporin use.     Chief Complaint  Patient presents with   Acute Visit    Care plan meeting     HPI:  We have come together for his care plan meeting.  BIMS 15/15 mood 0/30. He is nonambulatory;spends nearly all of time in bed. Extensive to total care for adls. He is incontinent of bladder and bowel.  He requires extensive assist to dependent for his adls. He is incontinent of bladder and bowel. Dietary:  weight is 174.6 pounds is stable has a good appetite. Therapy for splinting left upper extremity; and getting out of bed to wheelchair and wheelchair positioning  .  There are no reports of uncontrolled pain. He continues to be followed for his chronic illnesses: Seizures Hemiparesis affecting left side as late effect of cerebrovascular accident (CVA) Protein calorie malnutrition severe Diabetic peripheral neuropathy  Past Medical History:  Diagnosis Date   DM (diabetes mellitus) ([place])    Hypertension    Stroke [facility])     Past Surgical History:  Procedure Laterality Date   CRANIECTOMY Right 04/01/2017   Procedure: RIGHT DECOMPRESSIVE CRANIECTOMY;  Surgeon: Ditty, Kevan Ny, MD;  Location: [place];  Service: Neurosurgery;  Laterality: Right;   ESOPHAGOGASTRODUODENOSCOPY N/A 04/13/2017   Procedure: ESOPHAGOGASTRODUODENOSCOPY (EGD);  Surgeon: Georganna Skeans, MD;  Location:  [place];  Service: General;  Laterality: N/A;  bedside   INCISION AND DRAINAGE PERIRECTAL ABSCESS N/A 09/28/2017   Procedure: IRRIGATION AND DEBRIDEMENT PERIANAL ABSCESS;  Surgeon: Virl Cagey, MD;  Location: [facility];  Service: General;  Laterality: N/A;   PEG PLACEMENT N/A 04/13/2017   Procedure: PERCUTANEOUS ENDOSCOPIC GASTROSTOMY (PEG) PLACEMENT;  Surgeon: Georganna Skeans, MD;  Location: [facility] ENDOSCOPY;  Service: General;  Laterality: N/A;    Social History   Socioeconomic History   Marital status: Unknown    Spouse name: Not on file   Number of children: Not on file   Years of education: Not on file   Highest education level: Not on file  Occupational History   Not on file  Tobacco Use   Smoking status: Former    Types: Cigarettes   Smokeless tobacco: Never   Tobacco comments:    UTA  Vaping Use   Vaping Use: Never used  Substance and Sexual Activity   Alcohol use: No    Comment: UTA   Drug use: No    Comment: UTA   Sexual activity: Not Currently    Birth control/protection: None  Other Topics Concern   Not on file  Social History Narrative   Not on file   Social Determinants of Health   Financial Resource Strain: Not on file  Food Insecurity: Not on file  Transportation Needs: Not on file  Physical Activity: Not on file  Stress: Not on file  Social Connections: Not on file  Intimate Partner Violence: Not on file  Family History  Problem Relation Age of Onset   Hypertension Father       VITAL SIGNS BP 110/70   Pulse (!) 58   Temp 97.9 F (36.6 C)   Ht '5\' 6"'$  (1.676 m)   Wt 174 lb 9.6 oz (79.2 kg)   BMI 28.18 kg/m   Outpatient Encounter Medications as of 03/25/2021  Medication Sig   acetaminophen (TYLENOL) 325 MG tablet Take 650 mg by mouth every 4 (four) hours as needed. Not to exceed 3 grams in 24 hours   Amino Acids-Protein Hydrolys (FEEDING SUPPLEMENT, PRO-STAT SUGAR FREE 64,) LIQD Take 30 mLs by mouth 3 (three) times daily with meals.    apixaban (ELIQUIS) 5 MG TABS tablet Take 5 mg by mouth 2 (two) times daily.    atorvastatin (LIPITOR) 40 MG tablet Take 40 mg by mouth every evening.   baclofen (LIORESAL) 10 MG tablet Take 5 mg by mouth 3 (three) times daily.   bisacodyl (DULCOLAX) 10 MG suppository Place 1 suppository (10 mg total) rectally daily as needed for moderate constipation.   Cholecalciferol 1.25 MG (50000 UT) capsule Take 50,000 Units by mouth once a week. For Low Vitamin D Level on Monday   Dextromethorphan-guaiFENesin (ROBITUSSIN COUGH+CHEST CONG DM PO) Take 10 mLs by mouth every 6 (six) hours as needed.   docusate (COLACE) 50 MG/5ML liquid Give 10 ml by mouth twice a day   gabapentin (NEURONTIN) 100 MG capsule Take 100 mg by mouth at bedtime.   levETIRAcetam (KEPPRA) 100 MG/ML solution Take 500 mg by mouth 2 (two) times daily. For Seizures Mix in Grape Juice   LORazepam (ATIVAN) 2 MG/ML injection Inject 0.5 mLs (1 mg total) into the muscle every 15 (fifteen) minutes as needed. Up to 3 doses for uncontrolled seizure activity   Menthol, Topical Analgesic, (BIOFREEZE) 4 % GEL Apply 1 application topically 2 (two) times daily. Apply to left shoulder   metFORMIN (GLUCOPHAGE) 500 MG tablet Take by mouth 2 (two) times daily with a meal.   metoprolol tartrate (LOPRESSOR) 25 MG tablet Take 12.5 mg by mouth 2 (two) times daily.   NON FORMULARY Regular Diet   omeprazole (PRILOSEC) 40 MG capsule Take 40 mg by mouth daily.    sitaGLIPtin (JANUVIA) 100 MG tablet Take 100 mg by mouth daily.   No facility-administered encounter medications on file as of 03/25/2021.     SIGNIFICANT DIAGNOSTIC EXAMS  PREVIOUS:   08-24-18: ct of head: 1. 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 Increased volume loss of the infarcted brain as well as decreased herniation of brain and dura through the craniectomy defect. 2. No new intracranial abnormality.  NO NEW EXAMS.   LABS REVIEWED PREVIOUS:   02-06-20:  urine micro-albumin: 27.4  03-13-20: glucose 116; bun 12; creat 0.67; k+ 3.8; na++ 136; ca 8.8 liver normal albumin 3.1 vit D 29.72 03-28-20: wbc 15.5; hgb 15.6; hct 51.7; mcv 93.5 plt clump; glucose 153; bun 16; creat 0.99; k+ 4.4; na++ 137; ca 9.6 tsh 0.811; hgb a1c 4.9;  03-29-20: chol 143 ldl 96 trig 83 hdl 30 HIV nr  04-06-20: wbc 10.9; hgb 12.7; hct 42.2; mcv 95.5 plt 171; glucose 91; bun 11; creat 0.76; k+ 4.7; na++ 140; ca 9.0 liver normal albumin 2.9 depakote 34  04-16-20: depakote 56   08-06-20: Vit D  40.07 09-10-20: glucose 163; bun 8; creat 0.60; k+ 3.7; na++ 138; ca 9.0 GFR>60; liver normal albumin 2.7; hgb a1c 7.5; chol 115; ldl 62;  trig 108; hdl 31; tsh 1.233  01-28-21: wbc 11.9; hgb 10.8; hct 37.7; mcv 87.9 plt 208; glucose 99; bun 11; creat 0.63; k+ 3.7; na++ 139; ca 8.9; GFR >60; liver normal albumin 2.9; hgb a1c 5.1   NO NEW LABS.    Review of Systems  Constitutional:  Negative for malaise/fatigue.  Respiratory:  Negative for cough and shortness of breath.   Cardiovascular:  Negative for chest pain, palpitations and leg swelling.  Gastrointestinal:  Negative for abdominal pain, constipation and heartburn.  Musculoskeletal:  Negative for back pain, joint pain and myalgias.  Skin: Negative.   Neurological:  Negative for dizziness.  Psychiatric/Behavioral:  The patient is not nervous/anxious.     Physical Exam Constitutional:      General: He is not in acute distress.    Appearance: He is well-developed. He is not diaphoretic.  Neck:     Thyroid: No thyromegaly.  Cardiovascular:     Rate and Rhythm: Normal rate and regular rhythm.     Pulses: Normal pulses.     Heart sounds: Normal heart sounds.  Pulmonary:     Effort: Pulmonary effort is normal. No respiratory distress.     Breath sounds: Normal breath sounds.  Abdominal:     General: Bowel sounds are normal. There is no distension.     Palpations: Abdomen is soft.     Tenderness: There is no abdominal tenderness.   Musculoskeletal:     Cervical back: Neck supple.     Right lower leg: No edema.     Left lower leg: No edema.     Comments: Left hemiplegia with stiffness  Lymphadenopathy:     Cervical: No cervical adenopathy.  Skin:    General: Skin is warm and dry.  Neurological:     Mental Status: He is alert. Mental status is at baseline.  Psychiatric:        Mood and Affect: Mood normal.      ASSESSMENT/ PLAN:  TODAY  Seizures Hemiparesis affecting left side as late effect of cerebrovascular accident (CVA)  Protein calorie malnutrition severe Diabetic peripheral neuropathy  Will continue current medications Will continue current plan of care Will continue to monitor his status.   Time spent with patient: 40 minutes: goals of care; medications; plan of care.    Ok Edwards NP [facility]  Contact [phone] Monday through Friday 8am- 5pm  After hours call [phone]

## 2021-04-01 ENCOUNTER — Encounter: Payer: Self-pay | Admitting: Internal Medicine

## 2021-04-01 ENCOUNTER — Non-Acute Institutional Stay (SKILLED_NURSING_FACILITY): Payer: Medicaid Other | Admitting: Internal Medicine

## 2021-04-01 DIAGNOSIS — E1159 Type 2 diabetes mellitus with other circulatory complications: Secondary | ICD-10-CM

## 2021-04-01 DIAGNOSIS — D509 Iron deficiency anemia, unspecified: Secondary | ICD-10-CM | POA: Diagnosis not present

## 2021-04-01 DIAGNOSIS — E1149 Type 2 diabetes mellitus with other diabetic neurological complication: Secondary | ICD-10-CM | POA: Diagnosis not present

## 2021-04-01 DIAGNOSIS — E43 Unspecified severe protein-calorie malnutrition: Secondary | ICD-10-CM | POA: Diagnosis not present

## 2021-04-01 DIAGNOSIS — R221 Localized swelling, mass and lump, neck: Secondary | ICD-10-CM

## 2021-04-01 DIAGNOSIS — I152 Hypertension secondary to endocrine disorders: Secondary | ICD-10-CM

## 2021-04-01 NOTE — Assessment & Plan Note (Addendum)
Current H/H 10.8/37.7; prior H/H 12.7/42.2 Indices hypochromic ,normocytic ROS negative for bleeding dyscrasias & none reported by Staff Monitor CBC & check iron panel if anemia progressive

## 2021-04-01 NOTE — Assessment & Plan Note (Signed)
Improved :Albumin 2.9, prior value 2.7 Total protein 6.7 , up from 6.4

## 2021-04-01 NOTE — Assessment & Plan Note (Signed)
BP controlled; no change in antihypertensive medications  

## 2021-04-01 NOTE — Assessment & Plan Note (Signed)
DM with neurovascular complications Current 123456: 5.1%, down from 7.5% A1c goal : <7% No hypoglycemia Discuss deprescribing w Ok Edwards NP

## 2021-04-01 NOTE — Patient Instructions (Signed)
See assessment and plan under each diagnosis in the problem list and acutely for this visit 

## 2021-04-01 NOTE — Progress Notes (Signed)
NURSING HOME LOCATION: [facility] ROOM NUMBER:  [id number]  CODE STATUS:  DNR  PCP:  Ok Edwards NP  This is a nursing facility follow up visit of chronic medical diagnoses & to document compliance with Regulation 483.30 (c) in The [place] Manual Phase 2 which mandates caregiver visit ( visits can alternate among physician, PA or NP as per statutes) within 10 days of 30 days / 60 days/ 90 days post admission to SNF date    Interim medical record and care since last SNF visit was updated with review of diagnostic studies and change in clinical status since last visit were documented.  HPI: He is a permanent resident of the facility with medical diagnoses of history of stroke with residual hemiparesis, seizure disorder, protein caloric malnutrition, diabetes with peripheral neuropathy, dyslipidemia, GERD, PAF, and essential hypertension. Most current labs were reviewed; there has been slight progression in his anemia with a drop in hemoglobin/hematocrit from 12.7/42.2 down to 10.8/37.7.  His protein caloric malnutrition is improved with rise in total protein from 6.4 to 6.7, now normal.  Albumin has increased from 2.7 up to 2.9.  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  His diabetes control has improved significantly; A1c of 7.5% has dropped to 5.1%, prediabetic.  Review of systems: His only complaints are chronic constipation as well as indigestion.  Constitutional: No fever, significant weight change, fatigue  Eyes: No redness, discharge, pain, vision change ENT/mouth: No nasal congestion,  purulent discharge, earache, change in hearing, sore throat  Cardiovascular: No chest pain, palpitations, paroxysmal nocturnal dyspnea, claudication, edema  Respiratory: No cough, sputum production, hemoptysis, DOE, significant snoring, apnea   Gastrointestinal: No  dysphagia, abdominal pain, nausea /vomiting, rectal bleeding,  melena Genitourinary: No dysuria, hematuria, pyuria, incontinence, nocturia Dermatologic: No rash, pruritus, change in appearance of skin Neurologic: No dizziness, headache, syncope, seizures, numbness, tingling Psychiatric: No significant anxiety, depression, insomnia, anorexia Endocrine: No change in hair/skin/nails, excessive thirst, excessive hunger, excessive urination  Hematologic/lymphatic: No significant bruising, lymphadenopathy, abnormal bleeding  Physical exam:  Pertinent or positive findings: Pattern alopecia is present.  He is closely shaven.  There is a large slightly lobulated subcutaneous mass in the right lateral neck.  I can appreciate no associated lymphadenopathy.  Slight gallop cadence suggested with intermittent splitting of the second heart sound.  Breath sounds are somewhat bronchovesicular.  Bowel sounds are active.  Left hemapheresis is present.  Left upper extremity is flexed at the elbow and hand is in a brace.  The fingers are contracted.  Dorsalis pedis pulses are stronger than posterior tibial pulses.  General appearance: Adequately nourished; no acute distress, increased work of breathing is present.   Lymphatic: No lymphadenopathy about the head, neck, axilla. Eyes: No conjunctival inflammation or lid edema is present. There is no scleral icterus. Ears:  External ear exam shows no significant lesions or deformities.   Nose:  External nasal examination shows no deformity or inflammation. Nasal mucosa are pink and moist without lesions, exudates Oral exam:  Lips and gums are healthy appearing. There is no oropharyngeal erythema or exudate. Neck:  No  tenderness noted.    Heart:  No, murmur, click, rub .  Lungs:  without wheezes, rhonchi, rales, rubs. Abdomen:  Abdomen is soft and nontender with no organomegaly, hernias, masses. GU: Deferred  Extremities:  No cyanosis, clubbing, edema  Neurologic exam :Balance, Rhomberg, finger to nose testing could not be  completed due to clinical state Skin: Warm & dry w/o tenting. No significant lesions or rash.  See summary under each active problem in the Problem List with associated updated therapeutic plan

## 2021-04-06 ENCOUNTER — Ambulatory Visit (HOSPITAL_COMMUNITY): Admit: 2021-04-06 | Payer: Medicaid Other

## 2021-04-13 ENCOUNTER — Ambulatory Visit (HOSPITAL_COMMUNITY)
Admission: RE | Admit: 2021-04-13 | Discharge: 2021-04-13 | Disposition: A | Discharge status: Home / Self Care | Payer: Medicaid Other | Source: Ambulatory Visit | Attending: Adult Health | Admitting: Adult Health

## 2021-04-13 DIAGNOSIS — R221 Localized swelling, mass and lump, neck: Secondary | ICD-10-CM | POA: Insufficient documentation

## 2021-04-29 ENCOUNTER — Non-Acute Institutional Stay (SKILLED_NURSING_FACILITY): Payer: Medicaid Other | Admitting: Adult Health

## 2021-04-29 ENCOUNTER — Encounter: Payer: Self-pay | Admitting: Adult Health

## 2021-04-29 DIAGNOSIS — R569 Unspecified convulsions: Secondary | ICD-10-CM | POA: Diagnosis not present

## 2021-04-29 DIAGNOSIS — E43 Unspecified severe protein-calorie malnutrition: Secondary | ICD-10-CM | POA: Diagnosis not present

## 2021-04-29 DIAGNOSIS — E1142 Type 2 diabetes mellitus with diabetic polyneuropathy: Secondary | ICD-10-CM

## 2021-04-29 NOTE — Progress Notes (Signed)
Location:  [facility] Room Number: 127-D Place of Service:  SNF (31) Provider: Ok Edwards, Chauncey Cruel, NP  CODE STATUS: DNR  Allergies  Allergen Reactions   Cheese    Penicillins Hives    Has patient had a PCN reaction causing immediate rash, facial/tongue/throat swelling, SOB or lightheadedness with hypotension: Unknown Has patient had a PCN reaction causing severe rash involving mucus membranes or skin necrosis: Unknown Has patient had a PCN reaction that required hospitalization: No Has patient had a PCN reaction occurring within the last 10 years: No If all of the above answers are "NO", then may proceed with Cephalosporin use.     Chief Complaint  Patient presents with   Medical Management of Chronic Issues            Protein calorie malnutrition severe:  Diabetic peripheral neuropathy:  Seizure:  Chronic constipation    HPI:  He is a 60 year old long term resident of this facility being seen for the management of his chronic illnesses: Protein calorie malnutrition severe:  Diabetic peripheral neuropathy:  Seizure:  Chronic constipation. There are no reports of declining medications. No reports of uncontrolled pain; no reports of changes in appetite   Past Medical History:  Diagnosis Date   DM (diabetes mellitus) ([place])    History of seizures    Hypertension    Stroke [facility])     Past Surgical History:  Procedure Laterality Date   CRANIECTOMY Right 04/01/2017   Procedure: RIGHT DECOMPRESSIVE CRANIECTOMY;  Surgeon: Ditty, Kevan Ny, MD;  Location: [facility];  Service: Neurosurgery;  Laterality: Right;   ESOPHAGOGASTRODUODENOSCOPY N/A 04/13/2017   Procedure: ESOPHAGOGASTRODUODENOSCOPY (EGD);  Surgeon: Georganna Skeans, MD;  Location: [place];  Service: General;  Laterality: N/A;  bedside   INCISION AND DRAINAGE PERIRECTAL ABSCESS N/A 09/28/2017   Procedure: IRRIGATION AND DEBRIDEMENT PERIANAL ABSCESS;  Surgeon: Virl Cagey, MD;  Location: [facility];   Service: General;  Laterality: N/A;   PEG PLACEMENT N/A 04/13/2017   Procedure: PERCUTANEOUS ENDOSCOPIC GASTROSTOMY (PEG) PLACEMENT;  Surgeon: Georganna Skeans, MD;  Location: [facility];  Service: General;  Laterality: N/A;    Social History   Socioeconomic History   Marital status: Unknown    Spouse name: Not on file   Number of children: Not on file   Years of education: Not on file   Highest education level: Not on file  Occupational History   Not on file  Tobacco Use   Smoking status: Former    Types: Cigarettes   Smokeless tobacco: Never   Tobacco comments:    UTA  Vaping Use   Vaping Use: Never used  Substance and Sexual Activity   Alcohol use: No    Comment: UTA   Drug use: No    Comment: UTA   Sexual activity: Not Currently    Birth control/protection: None  Other Topics Concern   Not on file  Social History Narrative   Not on file   Social Determinants of Health   Financial Resource Strain: Not on file  Food Insecurity: Not on file  Transportation Needs: Not on file  Physical Activity: Not on file  Stress: Not on file  Social Connections: Not on file  Intimate Partner Violence: Not on file   Family History  Problem Relation Age of Onset   Hypertension Father       VITAL SIGNS BP (!) 101/58   Pulse 93   Temp 98.6 F (37 C)   Resp 16  Ht 5\' 6"  (1.676 m)   Wt 174 lb 9.6 oz (79.2 kg)   SpO2 95%   BMI 28.18 kg/m   Outpatient Encounter Medications as of 04/29/2021  Medication Sig   acetaminophen (TYLENOL) 325 MG tablet Take 650 mg by mouth every 4 (four) hours as needed. Not to exceed 3 grams in 24 hours   Amino Acids-Protein Hydrolys (FEEDING SUPPLEMENT, PRO-STAT SUGAR FREE 64,) LIQD Take 30 mLs by mouth 3 (three) times daily with meals.   apixaban (ELIQUIS) 5 MG TABS tablet Take 5 mg by mouth 2 (two) times daily.    atorvastatin (LIPITOR) 40 MG tablet Take 40 mg by mouth every evening.   baclofen (LIORESAL) 10 MG tablet Take 5 mg by mouth 3  (three) times daily.   bisacodyl (DULCOLAX) 10 MG suppository Place 1 suppository (10 mg total) rectally daily as needed for moderate constipation.   Cholecalciferol 1.25 MG (50000 UT) capsule Take 50,000 Units by mouth once a week. For Low Vitamin D Level on Monday   Dextromethorphan-guaiFENesin (ROBITUSSIN COUGH+CHEST CONG DM PO) Take 10 mLs by mouth every 6 (six) hours as needed.   docusate (COLACE) 50 MG/5ML liquid Give 10 ml by mouth twice a day   gabapentin (NEURONTIN) 100 MG capsule Take 100 mg by mouth at bedtime.   levETIRAcetam (KEPPRA) 100 MG/ML solution Take 500 mg by mouth 2 (two) times daily. For Seizures Mix in Grape Juice   LORazepam (ATIVAN) 2 MG/ML injection Inject 0.5 mLs (1 mg total) into the muscle every 15 (fifteen) minutes as needed. Up to 3 doses for uncontrolled seizure activity   Menthol, Topical Analgesic, (BIOFREEZE) 4 % GEL Apply 1 application topically 2 (two) times daily. Apply to left shoulder   metFORMIN (GLUCOPHAGE) 500 MG tablet Take by mouth 2 (two) times daily with a meal.   metoprolol tartrate (LOPRESSOR) 25 MG tablet Take 12.5 mg by mouth 2 (two) times daily.   NON FORMULARY Regular Diet   omeprazole (PRILOSEC) 40 MG capsule Take 40 mg by mouth daily.    sitaGLIPtin (JANUVIA) 100 MG tablet Take 100 mg by mouth daily.   No facility-administered encounter medications on file as of 04/29/2021.     SIGNIFICANT DIAGNOSTIC EXAMS  NO NEW EXAMS.   LABS REVIEWED PREVIOUS:   04-16-20: depakote 56   08-06-20: Vit D  40.07 09-10-20: glucose 163; bun 8; creat 0.60; k+ 3.7; na++ 138; ca 9.0 GFR>60; liver normal albumin 2.7; hgb a1c 7.5; chol 115; ldl 62; trig 108; hdl 31; tsh 1.233  01-28-21: wbc 11.9; hgb 10.8; hct 37.7; mcv 87.9 plt 208; glucose 99; bun 11; creat 0.63; k+ 3.7; na++ 139; ca 8.9; GFR >60; liver normal albumin 2.9; hgb a1c 5.1    NO NEW LABS.   Review of Systems  Constitutional:  Negative for malaise/fatigue.  Respiratory:  Negative for cough and  shortness of breath.   Cardiovascular:  Negative for chest pain, palpitations and leg swelling.  Gastrointestinal:  Negative for abdominal pain, constipation and heartburn.  Musculoskeletal:  Negative for back pain, joint pain and myalgias.  Skin: Negative.   Neurological:  Negative for dizziness.  Psychiatric/Behavioral:  The patient is not nervous/anxious.     Physical Exam Constitutional:      General: He is not in acute distress.    Appearance: He is well-developed. He is not diaphoretic.  Neck:     Thyroid: No thyromegaly.  Cardiovascular:     Rate and Rhythm: Normal rate and regular rhythm.  Pulses: Normal pulses.     Heart sounds: Normal heart sounds.  Pulmonary:     Effort: Pulmonary effort is normal. No respiratory distress.     Breath sounds: Normal breath sounds.  Abdominal:     General: Bowel sounds are normal. There is no distension.     Palpations: Abdomen is soft.     Tenderness: There is no abdominal tenderness.  Musculoskeletal:     Cervical back: Neck supple.     Right lower leg: No edema.     Left lower leg: No edema.     Comments:  Left hemiplegia with stiffness   Lymphadenopathy:     Cervical: No cervical adenopathy.  Skin:    General: Skin is warm and dry.  Neurological:     Mental Status: He is alert. Mental status is at baseline.  Psychiatric:        Mood and Affect: Mood normal.      ASSESSMENT/ PLAN:  TODAY;   Protein calorie malnutrition severe: is stable albumin 2.9 will continue prostat 30 mL three times daily   2. Diabetic peripheral neuropathy: is stable will continue gabapentin 100 mg nightly   3. Seizure: is stable without recent activity; will continue keppra 500 mg twice daily   4. Chronic constipation: is stable will continue colace twice daily   PREVIOUS   5. Cerebrovascular accident (CVA) due to embolism of right middle cerebral artery: is status post compression of right frontotemporopartietal craniotomy with duraplasty  2018: is stable will continue eliquids 5 mg twice daily   6. Vitamin D deficiency: is stable level 40.07 will continue 50,000 units weekly   7. 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 GERD without esophagitis: is stable will continue prilosec 40 mg daily   9. AF (paroxsymal atrial fibrillation) heart rate is stable will continue lopressor 12.5 mg twice daily for rate control and eliquis 5 mg twice daily   10 Dyslipidemia associated with type 2 diabetes mellitus: is stable LDL 62 will continue lipitor 40 mg daily   11. Hypertension associated with type 2 diabetes mellitus is stable b/p 101/58 will continue lopressor 12.5 mg twice daily   12.  Type 2 diabetes mellitus with neurological complications: is stable hgb a1c  5.1. will continue januvia 100 mg daily metformin 500 mg twice daily   13. Hypochromic anemia: is stable hgb 11.9 will monitor     Will check: vit d; mag; hgb a1c lipids micro albumin    Ok Edwards NP [facility]  Contact [phone] Monday through Friday 8am- 5pm  After hours call [phone]

## 2021-05-03 ENCOUNTER — Other Ambulatory Visit (HOSPITAL_COMMUNITY)
Admission: RE | Admit: 2021-05-03 | Discharge: 2021-05-03 | Disposition: A | Discharge status: Home / Self Care | Payer: Medicaid Other | Source: Skilled Nursing Facility | Attending: Adult Health | Admitting: Adult Health

## 2021-05-03 DIAGNOSIS — E1140 Type 2 diabetes mellitus with diabetic neuropathy, unspecified: Secondary | ICD-10-CM | POA: Insufficient documentation

## 2021-05-03 LAB — HEMOGLOBIN A1C
Hgb A1c MFr Bld: 4.9 % (ref 4.8–5.6)
Mean Plasma Glucose: 93.93 mg/dL

## 2021-05-03 LAB — LIPID PANEL
Cholesterol: 117 mg/dL (ref 0–200)
HDL: 28 mg/dL — ABNORMAL LOW (ref >40)
LDL Cholesterol: 64 mg/dL (ref 0–99)
Total CHOL/HDL Ratio: 4.2 RATIO
Triglycerides: 125 mg/dL (ref <150)
VLDL: 25 mg/dL (ref 0–40)

## 2021-05-03 LAB — VITAMIN D 25 HYDROXY (VIT D DEFICIENCY, FRACTURES): Vit D, 25-Hydroxy: 38.55 ng/mL (ref 30–100)

## 2021-05-03 LAB — MAGNESIUM: Magnesium: 2 mg/dL (ref 1.7–2.4)

## 2021-05-04 LAB — MICROALBUMIN / CREATININE URINE RATIO
Creatinine, Urine: 242.3 mg/dL
Microalb Creat Ratio: 38 mg/g creat — ABNORMAL HIGH (ref 0–29)
Microalb, Ur: 92.8 ug/mL — ABNORMAL HIGH

## 2021-05-07 ENCOUNTER — Ambulatory Visit (INDEPENDENT_AMBULATORY_CARE_PROVIDER_SITE_OTHER): Payer: Medicaid Other | Admitting: "Endocrinology

## 2021-05-07 ENCOUNTER — Encounter: Payer: Self-pay | Admitting: "Endocrinology

## 2021-05-07 ENCOUNTER — Encounter (HOSPITAL_COMMUNITY): Payer: Self-pay | Admitting: Radiology

## 2021-05-07 VITALS — BP 120/86 | HR 68

## 2021-05-07 DIAGNOSIS — E042 Nontoxic multinodular goiter: Secondary | ICD-10-CM | POA: Diagnosis not present

## 2021-05-07 NOTE — Progress Notes (Signed)
Patient Name  Joe Wilkinson, Joe Wilkinson Legal Sex  Male DOB  May 03, 1961 SSN  [id number] Address  [address]  [place] [address] Phone  [phone] (Home)    RE: Thyroid BX Received: Today Cassandria Anger, MD  Jillyn Hidden Yes, Ok to hold Eliquis for 48 hours for thyroid biopsy.        Previous Messages   ----- Message -----  From: Garth Bigness D  Sent: 05/07/2021  11:54 AM EDT  To: Cassandria Anger, MD  Subject: Thyroid BX                                     Patient is on Eliquis and will need to hold for 2 days prior to procedure. Please advise if okay to hold. Thanks Aniceto Boss

## 2021-05-07 NOTE — Progress Notes (Signed)
Endocrinology Consult Note                                            05/07/2021, 1:12 PM   Subjective:    Patient ID: Joe Wilkinson, male    DOB: 1961/01/23, PCP Joe Fee, NP   Past Medical History:  Diagnosis Date   DM (diabetes mellitus) ([place])    History of seizures    Hypertension    Stroke [facility])    Past Surgical History:  Procedure Laterality Date   CRANIECTOMY Right 04/01/2017   Procedure: RIGHT DECOMPRESSIVE CRANIECTOMY;  Surgeon: Ditty, Kevan Ny, MD;  Location: [place];  Service: Neurosurgery;  Laterality: Right;   ESOPHAGOGASTRODUODENOSCOPY N/A 04/13/2017   Procedure: ESOPHAGOGASTRODUODENOSCOPY (EGD);  Surgeon: Georganna Skeans, MD;  Location: [place];  Service: General;  Laterality: N/A;  bedside   INCISION AND DRAINAGE PERIRECTAL ABSCESS N/A 09/28/2017   Procedure: IRRIGATION AND DEBRIDEMENT PERIANAL ABSCESS;  Surgeon: Virl Cagey, MD;  Location: [facility];  Service: General;  Laterality: N/A;   PEG PLACEMENT N/A 04/13/2017   Procedure: PERCUTANEOUS ENDOSCOPIC GASTROSTOMY (PEG) PLACEMENT;  Surgeon: Georganna Skeans, MD;  Location: [facility];  Service: General;  Laterality: N/A;   Social History   Socioeconomic History   Marital status: Unknown    Spouse name: Not on file   Number of children: Not on file   Years of education: Not on file   Highest education level: Not on file  Occupational History   Not on file  Tobacco Use   Smoking status: Former    Types: Cigarettes   Smokeless tobacco: Never   Tobacco comments:    UTA  Vaping Use   Vaping Use: Never used  Substance and Sexual Activity   Alcohol use: No    Comment: UTA   Drug use: No    Comment: UTA   Sexual activity: Not Currently    Birth control/protection: None  Other Topics Concern   Not on file  Social History Narrative   Not on file   Social Determinants of Health   Financial Resource Strain: Not on file  Food Insecurity: Not on file  Transportation Needs: Not  on file  Physical Activity: Not on file  Stress: Not on file  Social Connections: Not on file   Family History  Problem Relation Age of Onset   Hypertension Father    Outpatient Encounter Medications as of 05/07/2021  Medication Sig   acetaminophen (TYLENOL) 325 MG tablet Take 650 mg by mouth every 4 (four) hours as needed. Not to exceed 3 grams in 24 hours   Amino Acids-Protein Hydrolys (FEEDING SUPPLEMENT, PRO-STAT SUGAR FREE 64,) LIQD Take 30 mLs by mouth 3 (three) times daily with meals.   apixaban (ELIQUIS) 5 MG TABS tablet Take 5 mg by mouth 2 (two) times daily.    atorvastatin (LIPITOR) 40 MG tablet Take 40 mg by mouth every evening.   baclofen (LIORESAL) 10 MG tablet Take 5 mg by mouth 3 (three) times daily.   bisacodyl (DULCOLAX) 10 MG suppository Place 1 suppository (10 mg total) rectally daily as needed for moderate constipation.   Cholecalciferol 1.25 MG (50000 UT) capsule Take 50,000 Units by mouth once a week. For Low Vitamin D Level on Monday   Dextromethorphan-guaiFENesin (ROBITUSSIN COUGH+CHEST CONG DM PO) Take 10 mLs by mouth every 6 (six) hours  as needed.   docusate (COLACE) 50 MG/5ML liquid Give 10 ml by mouth twice a day   gabapentin (NEURONTIN) 100 MG capsule Take 100 mg by mouth at bedtime.   levETIRAcetam (KEPPRA) 100 MG/ML solution Take 500 mg by mouth 2 (two) times daily. For Seizures Mix in Grape Juice   LORazepam (ATIVAN) 2 MG/ML injection Inject 0.5 mLs (1 mg total) into the muscle every 15 (fifteen) minutes as needed. Up to 3 doses for uncontrolled seizure activity   Menthol, Topical Analgesic, (BIOFREEZE) 4 % GEL Apply 1 application topically 2 (two) times daily. Apply to left shoulder   metFORMIN (GLUCOPHAGE) 500 MG tablet Take by mouth 2 (two) times daily with a meal.   metoprolol tartrate (LOPRESSOR) 25 MG tablet Take 12.5 mg by mouth 2 (two) times daily.   NON FORMULARY Regular Diet   omeprazole (PRILOSEC) 40 MG capsule Take 40 mg by mouth daily.     sitaGLIPtin (JANUVIA) 100 MG tablet Take 100 mg by mouth daily.   No facility-administered encounter medications on file as of 05/07/2021.   ALLERGIES: Allergies  Allergen Reactions   Cheese    Penicillins Hives    Has patient had a PCN reaction causing immediate rash, facial/tongue/throat swelling, SOB or lightheadedness with hypotension: Unknown Has patient had a PCN reaction causing severe rash involving mucus membranes or skin necrosis: Unknown Has patient had a PCN reaction that required hospitalization: No Has patient had a PCN reaction occurring within the last 10 years: No If all of the above answers are "NO", then may proceed with Cephalosporin use.     VACCINATION STATUS: Immunization History  Administered Date(s) Administered   Influenza-Unspecified 05/13/2019, 05/15/2020   Moderna Sars-Covid-2 Vaccination 08/14/2019, 09/11/2019, 06/11/2020, 11/18/2020   Pneumococcal Polysaccharide-23 03/29/2020   Pneumococcal-Unspecified 05/10/2018   Tdap 01/07/2019    HPI Joe Wilkinson is 60 y.o. male who presents today with a medical history as above. he is being seen in consultation for multinodular goiter requested by Joe Fee, NP.  This patient was previously seen in this clinic for immobilization hypercalcemia.  He was given IV bisphosphonate and that hypercalcemia has stabilized.  Patient is a nursing home resident.  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 After he was found to have clinical goiter, on April 13, 2021 he underwent dedicated thyroid ultrasound.  That study revealed 8 cm right lobe with 2 nodules measuring 6.1 and 1.7 cm.  Left lobe measuring 7.2 cm with 2 nodules measuring 5.3 and 2.4 cm.  3 of these nodules were reported to be suspicious and FNA was recommended.  Patient reports problem swallowing his pills, however no problem swallowing his food or drinks. Thyroid function was normal in February 2022.   He  denies any family history of thyroid malignancy. Is not accompanied by his aide from nursing home, provide his own history. His medical history also includes type 2 diabetes on management with Januvia, metformin.  He also has hyperlipidemia and hypertension on treatment. -He reports steady weight, no recent significant weight change.  Denies any tremors, heat intolerance, palpitations, no diaphoresis.    Review of Systems  Constitutional: no recent weight gain/loss, no fatigue, no subjective hyperthermia, no subjective hypothermia Eyes: no blurry vision, no xerophthalmia ENT: no sore throat, no nodules palpated in throat, no dysphagia/odynophagia, no hoarseness Cardiovascular: no Chest Pain, no Shortness of Breath, no palpitations, no leg swelling Respiratory: no cough, no shortness of breath Gastrointestinal: no Nausea/Vomiting/Diarhhea Musculoskeletal: no muscle/joint aches, +  wheelchair-bound due to previous CVA, left-sided hemiparesis. Skin: no rashes Neurological: no tremors, no numbness, no tingling, no dizziness Psychiatric: no depression, no anxiety  Objective:    Vitals with BMI 05/07/2021 04/29/2021 03/25/2021  Height - 5\' 6"  5\' 6"   Weight - 174 lbs 10 oz 174 lbs 10 oz  BMI - 29.56 21.30  Systolic 865 784 696  Diastolic 86 58 70  Pulse 68 93 58    BP 120/86   Pulse 68   Wt Readings from Last 3 Encounters:  04/29/21 174 lb 9.6 oz (79.2 kg)  03/25/21 174 lb 9.6 oz (79.2 kg)  02/17/21 174 lb (78.9 kg)    Physical Exam  Constitutional:  There is no height or weight on file to calculate BMI.,  not in acute distress, normal state of mind, + wheelchair-bound with left-sided hemiparesis. Eyes: PERRLA, EOMI, no exophthalmos ENT: moist mucous membranes, + gross thyromegaly, no gross cervical lymphadenopathy Cardiovascular: normal precordial activity, Regular Rate and Rhythm, no Murmur/Rubs/Gallops Respiratory:  adequate breathing efforts, no gross chest deformity, Clear to  auscultation bilaterally Gastrointestinal: abdomen soft, Non -tender, No distension, Bowel Sounds present, no gross organomegaly Musculoskeletal: Left-sided hemiparesis with contracted upper extremity.   Skin: moist, warm, no rashes Neurological: no tremor with outstretched hand, Deep tendon reflexes normal in bilateral lower extremities.  CMP ( most recent) CMP     Component Value Date/Time   NA 139 01/08/2021 0719   K 3.7 01/08/2021 0719   CL 109 01/08/2021 0719   CO2 24 01/08/2021 0719   GLUCOSE 99 01/08/2021 0719   BUN 11 01/08/2021 0719   CREATININE 0.62 01/08/2021 0719   CALCIUM 8.9 01/08/2021 0719   CALCIUM 11.5 (H) 09/05/2017 0709   PROT 6.7 01/08/2021 0719   ALBUMIN 2.9 (L) 01/08/2021 0719   AST 9 (L) 01/08/2021 0719   ALT 12 01/08/2021 0719   ALKPHOS 65 01/08/2021 0719   BILITOT 0.5 01/08/2021 0719   GFRNONAA >60 01/08/2021 0719   GFRAA >60 04/06/2020 0630     Diabetic Labs (most recent): Lab Results  Component Value Date   HGBA1C 4.9 05/03/2021   HGBA1C 5.1 01/08/2021   HGBA1C 7.5 (H) 09/10/2020     Lipid Panel ( most recent) Lipid Panel     Component Value Date/Time   CHOL 117 05/03/2021 0933   TRIG 125 05/03/2021 0933   HDL 28 (L) 05/03/2021 0933   CHOLHDL 4.2 05/03/2021 0933   VLDL 25 05/03/2021 0933   LDLCALC 64 05/03/2021 0933      Lab Results  Component Value Date   TSH 1.233 09/10/2020   TSH 0.811 03/28/2020   TSH 1.925 03/08/2019   TSH 1.524 02/06/2018   TSH 0.913 07/17/2017    Thyroid ultrasound on April 13, 2021.  Right lobe: 8.0 x 5.1 x 6.6 cm, 2 nodules measuring 6.1 cm and 1.7 cm   Left lobe: 7.2 x 3.9 x 4.7 cm, 2 nodules measuring 5.3 cm and 2.4 cm  IMPRESSION: 1. Enlarged, multinodular thyroid gland. 2. Nodule labeled 2, 3 and 4 all meet criteria for biopsy. Current recommendation is to only biopsy the 2 most suspicious nodules. Therefore nodules labeled 2 and 4 would be recommended for percutaneous biopsy.  Assessment  & Plan:   1. Multinodular goiter  - Pratham Cassatt  is being seen at a kind request of Joe Fee, NP. - I have reviewed his available thyroid records and clinically evaluated the patient. - Based on these reviews, he has large bilateral  multinodular goiter. I have reviewed his ultrasound findings with him.  I agree with radiology recommendation of fine-needle aspiration biopsy of 2 of the largest nodules on bilateral thyroid lobes. His presentation is consistent with euthyroid multinodular goiter. He will return in 2 weeks with biopsy results, which will determine definitive treatment approach.  - I did not initiate any new prescriptions today. - he is advised to maintain close follow up with Joe Fee, NP for primary care needs.   - Time spent with the patient: 60 minutes, of which >50% was spent in  counseling him about his multinodular goiter and the rest in obtaining information about his symptoms, reviewing his previous labs/studies ( including abstractions from other facilities),  evaluations, and treatments,  and developing a plan to confirm diagnosis and long term treatment based on the latest standards of care/guidelines; and documenting his care.  Isabell Jarvis participated in the discussions, expressed understanding, and voiced agreement with the above plans.  All questions were answered to his satisfaction. he is encouraged to contact clinic should he have any questions or concerns prior to his return visit.  Follow up plan: Return in about 2 weeks (around 05/21/2021) for F/U with Biopsy Results.   Glade Lloyd, MD [facility] [address] [place],  [address] Phone: [phone]  Fax: [phone]     05/07/2021, 1:12 PM  This note was partially dictated with voice recognition software. Similar sounding words can be transcribed inadequately or may not  be corrected upon review.

## 2021-05-26 ENCOUNTER — Telehealth: Payer: Self-pay

## 2021-05-26 ENCOUNTER — Ambulatory Visit: Payer: Medicaid Other | Admitting: "Endocrinology

## 2021-05-26 ENCOUNTER — Ambulatory Visit (HOSPITAL_COMMUNITY): Admit: 2021-05-26 | Payer: Medicaid Other

## 2021-05-26 NOTE — Telephone Encounter (Signed)
Noted  

## 2021-05-26 NOTE — Telephone Encounter (Signed)
Jessica with [facility] called and said they had to move his Biopsy to 11/3 because they did not hold his Eliquis. I went ahead and canceled our follow up because it was 11/1. I told Janett Billow that it may be sent to [facility] and if that was the case we would just watch and see when the results came back. She said when they do to please call Dakita at [phone] to reschedule him. If you can not get her, call Janett Billow at [phone]

## 2021-05-28 ENCOUNTER — Encounter: Payer: Self-pay | Admitting: Adult Health

## 2021-05-28 ENCOUNTER — Non-Acute Institutional Stay (SKILLED_NURSING_FACILITY): Payer: Medicaid Other | Admitting: Adult Health

## 2021-05-28 DIAGNOSIS — E559 Vitamin D deficiency, unspecified: Secondary | ICD-10-CM | POA: Diagnosis not present

## 2021-05-28 DIAGNOSIS — K219 Gastro-esophageal reflux disease without esophagitis: Secondary | ICD-10-CM

## 2021-05-28 DIAGNOSIS — I63411 Cerebral infarction due to embolism of right middle cerebral artery: Secondary | ICD-10-CM

## 2021-05-28 DIAGNOSIS — I69354 Hemiplegia and hemiparesis following cerebral infarction affecting left non-dominant side: Secondary | ICD-10-CM

## 2021-05-28 NOTE — Progress Notes (Signed)
Location:  [place] Room Number: 100 Place of Service:  SNF (31)   CODE STATUS: dnr   Allergies  Allergen Reactions   Cheese    Penicillins Hives    Has patient had a PCN reaction causing immediate rash, facial/tongue/throat swelling, SOB or lightheadedness with hypotension: Unknown Has patient had a PCN reaction causing severe rash involving mucus membranes or skin necrosis: Unknown Has patient had a PCN reaction that required hospitalization: No Has patient had a PCN reaction occurring within the last 10 years: No If all of the above answers are "NO", then may proceed with Cephalosporin use.     Chief Complaint  Patient presents with   Medical Management of Chronic Issues               Cerebrovascular accident (CVA) due to embolism of right middle cerebral artery: Vitamin D deficiency  Hemiplegia affecting left side as late effect of cerebrovascular accident (CVA)  GERD without esophagitis:    HPI:  He is a 60 year old long term resident of this facility being seen for the management of his chronic illnesses: Cerebrovascular accident (CVA) due to embolism of right middle cerebral artery: Vitamin D deficiency  Hemiplegia affecting left side as late effect of cerebrovascular accident (CVA)  GERD without esophagitis. There are no reports of uncontrolled pain. No reports of increased spasticity. No reports of insomnia; or depressive thoughts.   Past Medical History:  Diagnosis Date   DM (diabetes mellitus) ([place])    History of seizures    Hypertension    Stroke [facility])     Past Surgical History:  Procedure Laterality Date   CRANIECTOMY Right 04/01/2017   Procedure: RIGHT DECOMPRESSIVE CRANIECTOMY;  Surgeon: Ditty, Kevan Ny, MD;  Location: [place];  Service: Neurosurgery;  Laterality: Right;   ESOPHAGOGASTRODUODENOSCOPY N/A 04/13/2017   Procedure: ESOPHAGOGASTRODUODENOSCOPY (EGD);  Surgeon: Georganna Skeans, MD;  Location: [place];  Service:  General;  Laterality: N/A;  bedside   INCISION AND DRAINAGE PERIRECTAL ABSCESS N/A 09/28/2017   Procedure: IRRIGATION AND DEBRIDEMENT PERIANAL ABSCESS;  Surgeon: Virl Cagey, MD;  Location: [facility];  Service: General;  Laterality: N/A;   PEG PLACEMENT N/A 04/13/2017   Procedure: PERCUTANEOUS ENDOSCOPIC GASTROSTOMY (PEG) PLACEMENT;  Surgeon: Georganna Skeans, MD;  Location: [facility] ENDOSCOPY;  Service: General;  Laterality: N/A;    Social History   Socioeconomic History   Marital status: Unknown    Spouse name: Not on file   Number of children: Not on file   Years of education: Not on file   Highest education level: Not on file  Occupational History   Not on file  Tobacco Use   Smoking status: Former    Types: Cigarettes   Smokeless tobacco: Never   Tobacco comments:    UTA  Vaping Use   Vaping Use: Never used  Substance and Sexual Activity   Alcohol use: No    Comment: UTA   Drug use: No    Comment: UTA   Sexual activity: Not Currently    Birth control/protection: None  Other Topics Concern   Not on file  Social History Narrative   Not on file   Social Determinants of Health   Financial Resource Strain: Not on file  Food Insecurity: Not on file  Transportation Needs: Not on file  Physical Activity: Not on file  Stress: Not on file  Social Connections: Not on file  Intimate Partner Violence: Not on file   Family History  Problem Relation Age of Onset   Hypertension Father       VITAL SIGNS BP (!) 144/66   Pulse 74   Temp 97.9 F (36.6 C)   Ht 5\' 6"  (1.676 m)   Wt 173 lb (78.5 kg)   BMI 27.92 kg/m   Outpatient Encounter Medications as of 05/28/2021  Medication Sig   acetaminophen (TYLENOL) 325 MG tablet Take 650 mg by mouth every 4 (four) hours as needed. Not to exceed 3 grams in 24 hours   Amino Acids-Protein Hydrolys (FEEDING SUPPLEMENT, PRO-STAT SUGAR FREE 64,) LIQD Take 30 mLs by mouth 3 (three) times daily with meals.   apixaban (ELIQUIS) 5 MG TABS  tablet Take 5 mg by mouth 2 (two) times daily.    atorvastatin (LIPITOR) 40 MG tablet Take 40 mg by mouth every evening.   baclofen (LIORESAL) 10 MG tablet Take 5 mg by mouth 3 (three) times daily.   bisacodyl (DULCOLAX) 10 MG suppository Place 1 suppository (10 mg total) rectally daily as needed for moderate constipation.   Cholecalciferol 1.25 MG (50000 UT) capsule Take 50,000 Units by mouth once a week. For Low Vitamin D Level on Monday   Dextromethorphan-guaiFENesin (ROBITUSSIN COUGH+CHEST CONG DM PO) Take 10 mLs by mouth every 6 (six) hours as needed.   docusate (COLACE) 50 MG/5ML liquid Give 10 ml by mouth twice a day   gabapentin (NEURONTIN) 100 MG capsule Take 100 mg by mouth at bedtime.   levETIRAcetam (KEPPRA) 100 MG/ML solution Take 500 mg by mouth 2 (two) times daily. For Seizures Mix in Grape Juice   LORazepam (ATIVAN) 2 MG/ML injection Inject 0.5 mLs (1 mg total) into the muscle every 15 (fifteen) minutes as needed. Up to 3 doses for uncontrolled seizure activity   Menthol, Topical Analgesic, (BIOFREEZE) 4 % GEL Apply 1 application topically 2 (two) times daily. Apply to left shoulder   metFORMIN (GLUCOPHAGE) 500 MG tablet Take by mouth 2 (two) times daily with a meal.   metoprolol tartrate (LOPRESSOR) 25 MG tablet Take 12.5 mg by mouth 2 (two) times daily.   NON FORMULARY Regular Diet   omeprazole (PRILOSEC) 40 MG capsule Take 40 mg by mouth daily.    sitaGLIPtin (JANUVIA) 100 MG tablet Take 100 mg by mouth daily.   No facility-administered encounter medications on file as of 05/28/2021.     SIGNIFICANT DIAGNOSTIC EXAMS  NO NEW EXAMS.   LABS REVIEWED PREVIOUS:   04-16-20: depakote 56   08-06-20: Vit D  40.07 09-10-20: glucose 163; bun 8; creat 0.60; k+ 3.7; na++ 138; ca 9.0 GFR>60; liver normal albumin 2.7; hgb a1c 7.5; chol 115; ldl 62; trig 108; hdl 31; tsh 1.233  01-28-21: wbc 11.9; hgb 10.8; hct 37.7; mcv 87.9 plt 208; glucose 99; bun 11; creat 0.63; k+ 3.7; na++ 139; ca  8.9; GFR >60; liver normal albumin 2.9; hgb a1c 5.1    TODAY  05-03-21: hgb a1c 4.9; chol 117; ldl 64; trig 125; hdl 28; mag 2.0; vit D 38.55 micro albumin 92.8   Review of Systems  Constitutional:  Negative for malaise/fatigue.  Respiratory:  Negative for cough and shortness of breath.   Cardiovascular:  Negative for chest pain, palpitations and leg swelling.  Gastrointestinal:  Negative for abdominal pain, constipation and heartburn.  Musculoskeletal:  Negative for back pain, joint pain and myalgias.  Skin: Negative.   Neurological:  Negative for dizziness.  Psychiatric/Behavioral:  The patient is not nervous/anxious.    Physical Exam Constitutional:  General: He is not in acute distress.    Appearance: He is well-developed. He is not diaphoretic.  Neck:     Thyroid: No thyromegaly.  Cardiovascular:     Rate and Rhythm: Normal rate and regular rhythm.     Pulses: Normal pulses.     Heart sounds: Normal heart sounds.  Pulmonary:     Effort: Pulmonary effort is normal. No respiratory distress.     Breath sounds: Normal breath sounds.  Abdominal:     General: Bowel sounds are normal. There is no distension.     Palpations: Abdomen is soft.     Tenderness: There is no abdominal tenderness.  Musculoskeletal:     Cervical back: Neck supple.     Right lower leg: No edema.     Left lower leg: No edema.     Comments: Left hemiplegia   Lymphadenopathy:     Cervical: No cervical adenopathy.  Skin:    General: Skin is warm and dry.  Neurological:     Mental Status: He is alert. Mental status is at baseline.  Psychiatric:        Mood and Affect: Mood normal.      ASSESSMENT/ PLAN:  TODAY;   Cerebrovascular accident (CVA) due to embolism of right middle cerebral artery: is status post compression of right frontotemporopartietal craniotomy with duraplasty 2018: is stable will continue eliquis 5 mg twice daily   2. Vitamin D deficiency is stable level is 38.55 will continue  50,000 units weekly   3. Hemiplegia affecting left side as late effect of cerebrovascular accident (CVA) is stable will continue baclofen 5 mg three times daily for spasticity   4. GERD without esophagitis: is stable will continue prilosec 40 mg daily    PREVIOUS   5. AF (paroxsymal atrial fibrillation) heart rate is stable will continue lopressor 12.5 mg twice daily for rate control and eliquis 5 mg twice daily   6 Dyslipidemia associated with type 2 diabetes mellitus: is stable LDL 64 will continue lipitor 40 mg daily   7. Hypertension associated with type 2 diabetes mellitus is stable b/p 144/66 will continue lopressor 12.5 mg twice daily   8.  Type 2 diabetes mellitus with neurological complications: is stable hgb a1c  4.9. will continue metformin 500 mg twice daily will lower januvia to 50 mg daily   9. Hypochromic anemia: is stable hgb 11.9 will monitor   10. Protein calorie malnutrition severe: is stable albumin 2.9 will continue prostat 30 mL three times daily   11. Diabetic peripheral neuropathy: is stable will continue gabapentin 100 mg nightly   12. Seizure: is stable without recent activity; will continue keppra 500 mg twice daily   13. Chronic constipation: is stable will continue colace twice daily      Ok Edwards NP [facility]  Contact [phone] Monday through Friday 8am- 5pm  After hours call [phone]

## 2021-06-08 ENCOUNTER — Ambulatory Visit: Payer: Medicaid Other | Admitting: "Endocrinology

## 2021-06-10 ENCOUNTER — Encounter (HOSPITAL_COMMUNITY): Payer: Self-pay

## 2021-06-10 ENCOUNTER — Other Ambulatory Visit: Payer: Self-pay | Admitting: Radiology

## 2021-06-10 ENCOUNTER — Ambulatory Visit (HOSPITAL_COMMUNITY)
Admit: 2021-06-10 | Discharge: 2021-06-10 | Disposition: A | Discharge status: Home / Self Care | Payer: Medicaid Other | Attending: "Endocrinology | Admitting: "Endocrinology

## 2021-06-10 DIAGNOSIS — E042 Nontoxic multinodular goiter: Secondary | ICD-10-CM | POA: Diagnosis present

## 2021-06-10 DIAGNOSIS — D34 Benign neoplasm of thyroid gland: Secondary | ICD-10-CM | POA: Diagnosis not present

## 2021-06-11 LAB — CYTOLOGY - NON PAP

## 2021-06-18 ENCOUNTER — Encounter: Payer: Self-pay | Admitting: Adult Health

## 2021-06-18 ENCOUNTER — Non-Acute Institutional Stay (SKILLED_NURSING_FACILITY): Payer: Medicaid Other | Admitting: Adult Health

## 2021-06-18 DIAGNOSIS — I69354 Hemiplegia and hemiparesis following cerebral infarction affecting left non-dominant side: Secondary | ICD-10-CM

## 2021-06-18 DIAGNOSIS — R569 Unspecified convulsions: Secondary | ICD-10-CM | POA: Diagnosis not present

## 2021-06-18 DIAGNOSIS — I480 Paroxysmal atrial fibrillation: Secondary | ICD-10-CM | POA: Diagnosis not present

## 2021-06-18 NOTE — Progress Notes (Signed)
Location:  [place] Room Number: 127-D Place of Service:  SNF (31)   CODE STATUS: DNR  Allergies  Allergen Reactions   Cheese    Penicillins Hives    Has patient had a PCN reaction causing immediate rash, facial/tongue/throat swelling, SOB or lightheadedness with hypotension: Unknown Has patient had a PCN reaction causing severe rash involving mucus membranes or skin necrosis: Unknown Has patient had a PCN reaction that required hospitalization: No Has patient had a PCN reaction occurring within the last 10 years: No If all of the above answers are "NO", then may proceed with Cephalosporin use.     Chief Complaint  Patient presents with   Acute Visit    Care plan meeting    HPI:  We have come together for his care plan meeting. BIMS 15/15 mood 0/30. He spends all of his time in bed. No falls. He requires extensive to dependent with his adls. He is incontinent of bladder and bowel. Dietary: regular diet has a good appetite feeds self wight is 173 pounds and remains stable. Therapy none at this time. He continues to be followed for his chronic illnesses including:  Seizures  Hemiplegia affecting left side as late effect of CVA (cerebrovascular accident)   AF (paroxysmal atrial fibrillation)   Past Medical History:  Diagnosis Date   DM (diabetes mellitus) ([place])    History of seizures    Hypertension    Stroke [facility])     Past Surgical History:  Procedure Laterality Date   CRANIECTOMY Right 04/01/2017   Procedure: RIGHT DECOMPRESSIVE CRANIECTOMY;  Surgeon: Ditty, Kevan Ny, MD;  Location: [place];  Service: Neurosurgery;  Laterality: Right;   ESOPHAGOGASTRODUODENOSCOPY N/A 04/13/2017   Procedure: ESOPHAGOGASTRODUODENOSCOPY (EGD);  Surgeon: Georganna Skeans, MD;  Location: [place];  Service: General;  Laterality: N/A;  bedside   INCISION AND DRAINAGE PERIRECTAL ABSCESS N/A 09/28/2017   Procedure: IRRIGATION AND DEBRIDEMENT PERIANAL ABSCESS;  Surgeon:  Virl Cagey, MD;  Location: [facility];  Service: General;  Laterality: N/A;   PEG PLACEMENT N/A 04/13/2017   Procedure: PERCUTANEOUS ENDOSCOPIC GASTROSTOMY (PEG) PLACEMENT;  Surgeon: Georganna Skeans, MD;  Location: [facility] ENDOSCOPY;  Service: General;  Laterality: N/A;    Social History   Socioeconomic History   Marital status: Unknown    Spouse name: Not on file   Number of children: Not on file   Years of education: Not on file   Highest education level: Not on file  Occupational History   Not on file  Tobacco Use   Smoking status: Former    Types: Cigarettes   Smokeless tobacco: Never   Tobacco comments:    UTA  Vaping Use   Vaping Use: Never used  Substance and Sexual Activity   Alcohol use: No    Comment: UTA   Drug use: No    Comment: UTA   Sexual activity: Not Currently    Birth control/protection: None  Other Topics Concern   Not on file  Social History Narrative   Not on file   Social Determinants of Health   Financial Resource Strain: Not on file  Food Insecurity: Not on file  Transportation Needs: Not on file  Physical Activity: Not on file  Stress: Not on file  Social Connections: Not on file  Intimate Partner Violence: Not on file   Family History  Problem Relation Age of Onset   Hypertension Father       VITAL SIGNS BP 101/72   Pulse 86  Temp 98.1 F (36.7 C)   Resp 16   Ht 5\' 6"  (1.676 m)   Wt 172 lb 1.6 oz (78.1 kg)   SpO2 95%   BMI 27.78 kg/m   Outpatient Encounter Medications as of 06/18/2021  Medication Sig   acetaminophen (TYLENOL) 325 MG tablet Take 650 mg by mouth every 4 (four) hours as needed. Not to exceed 3 grams in 24 hours   apixaban (ELIQUIS) 5 MG TABS tablet Take 5 mg by mouth 2 (two) times daily.    atorvastatin (LIPITOR) 40 MG tablet Take 40 mg by mouth every evening.   baclofen (LIORESAL) 10 MG tablet Take 5 mg by mouth 3 (three) times daily.   bisacodyl (DULCOLAX) 10 MG suppository Place 1 suppository (10 mg total)  rectally daily as needed for moderate constipation.   Cholecalciferol 1.25 MG (50000 UT) capsule Take 50,000 Units by mouth once a week. For Low Vitamin D Level on Monday   Dextromethorphan-guaiFENesin (ROBITUSSIN COUGH+CHEST CONG DM PO) Take 10 mLs by mouth every 6 (six) hours as needed.   docusate (COLACE) 50 MG/5ML liquid Give 10 ml by mouth twice a day   gabapentin (NEURONTIN) 100 MG capsule Take 100 mg by mouth at bedtime.   levETIRAcetam (KEPPRA) 100 MG/ML solution Take 500 mg by mouth 2 (two) times daily. For Seizures Mix in Grape Juice   LORazepam (ATIVAN) 2 MG/ML injection Inject 0.5 mLs (1 mg total) into the muscle every 15 (fifteen) minutes as needed. Up to 3 doses for uncontrolled seizure activity   Menthol, Topical Analgesic, (BIOFREEZE) 4 % GEL Apply 1 application topically 2 (two) times daily. Apply to left shoulder   metFORMIN (GLUCOPHAGE) 500 MG tablet Take by mouth 2 (two) times daily with a meal.   metoprolol tartrate (LOPRESSOR) 25 MG tablet Take 12.5 mg by mouth 2 (two) times daily.   NON FORMULARY Regular Diet   omeprazole (PRILOSEC) 40 MG capsule Take 40 mg by mouth daily.    sitaGLIPtin (JANUVIA) 100 MG tablet Take 100 mg by mouth daily.   No facility-administered encounter medications on file as of 06/18/2021.     SIGNIFICANT DIAGNOSTIC EXAMS   NO NEW EXAMS.   LABS REVIEWED PREVIOUS:    08-06-20: Vit D  40.07 09-10-20: glucose 163; bun 8; creat 0.60; k+ 3.7; na++ 138; ca 9.0 GFR>60; liver normal albumin 2.7; hgb a1c 7.5; chol 115; ldl 62; trig 108; hdl 31; tsh 1.233  01-28-21: wbc 11.9; hgb 10.8; hct 37.7; mcv 87.9 plt 208; glucose 99; bun 11; creat 0.63; k+ 3.7; na++ 139; ca 8.9; GFR >60; liver normal albumin 2.9; hgb a1c 5.1   05-03-21: hgb a1c 4.9; chol 117; ldl 64; trig 125; hdl 28; mag 2.0; vit D 38.55 micro albumin 92.8  NO NEW LABS.    Review of Systems  Constitutional:  Negative for malaise/fatigue.  Respiratory:  Negative for cough and shortness of  breath.   Cardiovascular:  Negative for chest pain, palpitations and leg swelling.  Gastrointestinal:  Negative for abdominal pain, constipation and heartburn.  Musculoskeletal:  Negative for back pain, joint pain and myalgias.  Skin: Negative.   Neurological:  Negative for dizziness.  Psychiatric/Behavioral:  The patient is not nervous/anxious.    Physical Exam Constitutional:      General: He is not in acute distress.    Appearance: He is well-developed. He is not diaphoretic.  Neck:     Thyroid: No thyromegaly.  Cardiovascular:     Rate and Rhythm: Normal rate  and regular rhythm.     Pulses: Normal pulses.     Heart sounds: Normal heart sounds.  Pulmonary:     Effort: Pulmonary effort is normal. No respiratory distress.     Breath sounds: Normal breath sounds.  Abdominal:     General: Bowel sounds are normal. There is no distension.     Palpations: Abdomen is soft.     Tenderness: There is no abdominal tenderness.  Musculoskeletal:     Cervical back: Neck supple.     Right lower leg: No edema.     Left lower leg: No edema.     Comments: Left hemiplegia   Lymphadenopathy:     Cervical: No cervical adenopathy.  Skin:    General: Skin is warm and dry.  Neurological:     Mental Status: He is alert and oriented to person, place, and time.  Psychiatric:        Mood and Affect: Mood normal.     ASSESSMENT/ PLAN:  TODAY  Seizures Hemiplegia affecting left side as late effect of CVA (cerebrovascular accident) AF (paroxysmal atrial fibrillation)   Will continue current medications Will continue current plan of care Will continue to monitor his status.   Time spent with patient 40 minutes: medications; getting out of bed; plan of care.    Ok Edwards NP [facility]  Contact [phone] Monday through Friday 8am- 5pm  After hours call [phone]

## 2021-06-29 ENCOUNTER — Encounter (HOSPITAL_COMMUNITY): Payer: Self-pay

## 2021-06-30 ENCOUNTER — Encounter: Payer: Self-pay | Admitting: Internal Medicine

## 2021-06-30 ENCOUNTER — Non-Acute Institutional Stay (SKILLED_NURSING_FACILITY): Payer: Medicaid Other | Admitting: Internal Medicine

## 2021-06-30 DIAGNOSIS — E1169 Type 2 diabetes mellitus with other specified complication: Secondary | ICD-10-CM

## 2021-06-30 DIAGNOSIS — E1149 Type 2 diabetes mellitus with other diabetic neurological complication: Secondary | ICD-10-CM | POA: Diagnosis not present

## 2021-06-30 DIAGNOSIS — E785 Hyperlipidemia, unspecified: Secondary | ICD-10-CM | POA: Diagnosis not present

## 2021-06-30 DIAGNOSIS — E042 Nontoxic multinodular goiter: Secondary | ICD-10-CM | POA: Diagnosis not present

## 2021-06-30 NOTE — Progress Notes (Signed)
NURSING HOME LOCATION: [facility] ROOM NUMBER:  127 D  CODE STATUS: DNR  PCP: Ok Edwards NP  This is a nursing facility follow up visit of chronic medical diagnoses & to document compliance with Regulation 483.30 (c) in [employer] Manual Phase 2 which mandates caregiver visit ( visits can alternate among physician, PA or NP as per statutes) within 10 days of 30 days / 60 days/ 90 days post admission to SNF date    Interim medical record and care since last SNF visit was updated with review of diagnostic studies and change in clinical status since last visit were documented.  HPI: He is a permanent resident of the facility with medical diagnoses of diabetes with neurovascular complications, history of seizures, history of stroke, essential hypertension, and multinodular goiter. He has had fine-needle biopsy of thyroid nodules.  The right middle lobe 6.1 cm nodule revealed atypia of undetermined significance (Bethesda class III).  Biopsy of the 5.3 cm left middle lobe nodule revealed benign follicular nodule Bethesda class II).  Malignant change was not diagnosed. LDL is 64 with a goal of less than 70 because of his neurovascular history.  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  He has a normocytic, hypochromic anemia with H/H of 10.8/37.7.  A1c is prediabetic at 5.1%.  It had previously been 7.5% in February of this year.  Review of systems: He describes some tenderness in the area of the enlarged thyroid intermittently.  He denies dysphagia per se but does state that food will get caught in his mouth/teeth in the left mandibular area.  He describes intermittent numbness of the right hand.  He describes chronic constipation.  Constitutional: No fever, significant weight change, fatigue  Eyes: No redness, discharge, pain, vision change ENT/mouth: No nasal congestion,  purulent discharge, earache, change in hearing, sore  throat  Cardiovascular: No chest pain, palpitations, paroxysmal nocturnal dyspnea, claudication, Respiratory: No cough, sputum production, hemoptysis, DOE, significant snoring, apnea   Gastrointestinal: No heartburn, abdominal pain, nausea /vomiting, rectal bleeding, melena Genitourinary: No dysuria, hematuria, pyuria, incontinence, nocturia Musculoskeletal: No joint stiffness, joint swelling,  pain Dermatologic: No rash, pruritus, change in appearance of skin Neurologic: No dizziness, headache, syncope, seizures Psychiatric: No significant anxiety, depression, insomnia, anorexia Endocrine: No change in hair/skin/nails, excessive thirst, excessive hunger, excessive urination  Hematologic/lymphatic: No significant bruising, lymphadenopathy, abnormal bleeding Allergy/immunology: No itchy/watery eyes, significant sneezing, urticaria, angioedema  Physical exam:  Pertinent or positive findings: He speaks in somewhat of a singsong baby talk manner.  Pattern alopecia is present.  Maxilla is edentulous.  Lower teeth are coated & exhibit poor hygiene.  There is asymmetric swelling of the anterior neck, greater on the right.  There is irregular texture to palpation in this area.  The superior chest shows slightly thickened slightly hyperpigmented tissue suggesting posttraumatic scar.  He has low-grade expiratory rhonchi.  Abdomen is protuberant.  Bowel sounds are hyperactive.  Pedal pulses are difficult to palpable.  Feet are in booties.  There is no opposition in the left upper and left lower extremities.  The left hand is clawed.  General appearance: Adequately nourished; no acute distress, increased work of breathing is present.   Lymphatic: No lymphadenopathy about the head, neck, axilla. Eyes: No conjunctival inflammation or lid edema is present. There is no scleral icterus. Ears:  External ear exam shows no significant lesions or deformities.   Nose:  External nasal  examination shows no deformity or  inflammation. Nasal mucosa are pink and moist without lesions, exudates Neck:  No thyromegaly, masses, tenderness noted.    Heart:  Normal rate and regular rhythm. S1 and S2 normal without gallop, murmur, click, rub .  Lungs:  without wheezes, rales, rubs. Abdomen:  Abdomen is soft and nontender with no organomegaly, hernias, masses. GU: Deferred  Extremities:  No cyanosis, clubbing, edema  Neurologic exam :Balance, Rhomberg, finger to nose testing could not be completed due to clinical state Skin: Warm & dry w/o tenting. No significant  rash.  See summary under each active problem in the Problem List with associated updated therapeutic plan

## 2021-06-30 NOTE — Assessment & Plan Note (Signed)
A1c 5 months ago was prediabetic at 5.1%, down from a value of 7.5% in February of this year.  Update indicated.

## 2021-06-30 NOTE — Patient Instructions (Signed)
See assessment and plan under each diagnosis in the problem list and acutely for this visit 

## 2021-06-30 NOTE — Assessment & Plan Note (Signed)
Current LDL is at goal with a value of 64.  Goal would be less than 70 with his neurovascular history and diabetes.

## 2021-06-30 NOTE — Assessment & Plan Note (Addendum)
Annual monitor with exam, TFTs, and ultrasound.

## 2021-07-13 ENCOUNTER — Encounter: Payer: Self-pay | Admitting: "Endocrinology

## 2021-07-13 ENCOUNTER — Ambulatory Visit (INDEPENDENT_AMBULATORY_CARE_PROVIDER_SITE_OTHER): Payer: Medicaid Other | Admitting: "Endocrinology

## 2021-07-13 VITALS — BP 118/68 | HR 56

## 2021-07-13 DIAGNOSIS — E042 Nontoxic multinodular goiter: Secondary | ICD-10-CM

## 2021-07-13 NOTE — Progress Notes (Signed)
07/13/2021, 6:41 PM  Endocrinology follow-up note  Subjective:    Patient ID: Joe Wilkinson, male    DOB: January 30, 1961, PCP Joe Fee, NP   Past Medical History:  Diagnosis Date   DM (diabetes mellitus) ([place])    History of seizures    Hypertension    Stroke [facility])    Past Surgical History:  Procedure Laterality Date   CRANIECTOMY Right 04/01/2017   Procedure: RIGHT DECOMPRESSIVE CRANIECTOMY;  Surgeon: Ditty, Kevan Ny, MD;  Location: [place];  Service: Neurosurgery;  Laterality: Right;   ESOPHAGOGASTRODUODENOSCOPY N/A 04/13/2017   Procedure: ESOPHAGOGASTRODUODENOSCOPY (EGD);  Surgeon: Georganna Skeans, MD;  Location: [place];  Service: General;  Laterality: N/A;  bedside   INCISION AND DRAINAGE PERIRECTAL ABSCESS N/A 09/28/2017   Procedure: IRRIGATION AND DEBRIDEMENT PERIANAL ABSCESS;  Surgeon: Virl Cagey, MD;  Location: [facility];  Service: General;  Laterality: N/A;   PEG PLACEMENT N/A 04/13/2017   Procedure: PERCUTANEOUS ENDOSCOPIC GASTROSTOMY (PEG) PLACEMENT;  Surgeon: Georganna Skeans, MD;  Location: [facility] END[facility];  Service: General;  Laterality: N/A;   Social History   Socioeconomic History   Marital status: Unknown    Spouse name: Not on file   Number of children: Not on file   Years of education: Not on file   Highest education level: Not on file  Occupational History   Not on file  Tobacco Use   Smoking status: Former    Types: Cigarettes   Smokeless tobacco: Never   Tobacco comments:    UTA  Vaping Use   Vaping Use: Never used  Substance and Sexual Activity   Alcohol use: No    Comment: UTA   Drug use: No    Comment: UTA   Sexual activity: Not Currently    Birth control/protection: None  Other Topics Concern   Not on file  Social History Narrative   Not on file   Social Determinants of Health   Financial Resource Strain: Not on file  Food Insecurity: Not on file  Transportation Needs:  Not on file  Physical Activity: Not on file  Stress: Not on file  Social Connections: Not on file   Family History  Problem Relation Age of Onset   Hypertension Father    Outpatient Encounter Medications as of 07/13/2021  Medication Sig   acetaminophen (TYLENOL) 325 MG tablet Take 650 mg by mouth every 4 (four) hours as needed. Not to exceed 3 grams in 24 hours   apixaban (ELIQUIS) 5 MG TABS tablet Take 5 mg by mouth 2 (two) times daily.    atorvastatin (LIPITOR) 40 MG tablet Take 40 mg by mouth every evening.   baclofen (LIORESAL) 10 MG tablet Take 5 mg by mouth 3 (three) times daily.   bisacodyl (DULCOLAX) 10 MG suppository Place 1 suppository (10 mg total) rectally daily as needed for moderate constipation.   Cholecalciferol 1.25 MG (50000 UT) capsule Take 50,000 Units by mouth once a week. For Low Vitamin D Level on Monday   Dextromethorphan-guaiFENesin (ROBITUSSIN COUGH+CHEST CONG DM PO) Take 10 mLs by mouth every 6 (six) hours as needed.   docusate (COLACE) 50 MG/5ML liquid Give 10 ml by mouth twice a day   gabapentin (NEURONTIN) 100  MG capsule Take 100 mg by mouth at bedtime.   levETIRAcetam (KEPPRA) 100 MG/ML solution Take 500 mg by mouth 2 (two) times daily. For Seizures Mix in Grape Juice   LORazepam (ATIVAN) 2 MG/ML injection Inject 0.5 mLs (1 mg total) into the muscle every 15 (fifteen) minutes as needed. Up to 3 doses for uncontrolled seizure activity   Menthol, Topical Analgesic, (BIOFREEZE) 4 % GEL Apply 1 application topically 2 (two) times daily. Apply to left shoulder   metFORMIN (GLUCOPHAGE) 500 MG tablet Take by mouth 2 (two) times daily with a meal.   metoprolol tartrate (LOPRESSOR) 25 MG tablet Take 12.5 mg by mouth 2 (two) times daily.   NON FORMULARY Regular Diet   omeprazole (PRILOSEC) 40 MG capsule Take 40 mg by mouth daily.    sitaGLIPtin (JANUVIA) 100 MG tablet Take 100 mg by mouth daily.   No facility-administered encounter medications on file as of 07/13/2021.    ALLERGIES: Allergies  Allergen Reactions   Cheese    Penicillins Hives    Has patient had a PCN reaction causing immediate rash, facial/tongue/throat swelling, SOB or lightheadedness with hypotension: Unknown Has patient had a PCN reaction causing severe rash involving mucus membranes or skin necrosis: Unknown Has patient had a PCN reaction that required hospitalization: No Has patient had a PCN reaction occurring within the last 10 years: No If all of the above answers are "NO", then may proceed with Cephalosporin use.     VACCINATION STATUS: Immunization History  Administered Date(s) Administered   Influenza-Unspecified 05/13/2019, 05/15/2020   Moderna SARS-COV2 Booster Vaccination 06/01/2021   Moderna Sars-Covid-2 Vaccination 08/14/2019, 09/11/2019, 06/11/2020, 11/18/2020   Pneumococcal Polysaccharide-23 03/29/2020   Pneumococcal-Unspecified 05/10/2018   Tdap 01/07/2019    HPI Joe Wilkinson is 60 y.o. male who presents today with a medical history as above. he is being seen in consultation for multinodular goiter requested by Joe Fee, NP.  This patient was previously seen in this clinic for immobilization hypercalcemia.  He was given IV bisphosphonate and that hypercalcemia has stabilized.  Patient is a nursing home resident.  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 After he was found to have clinical goiter, on April 13, 2021 he underwent dedicated thyroid ultrasound.  That study revealed 8 cm right lobe with 2 nodules measuring 6.1 and 1.7 cm.  Left lobe measuring 7.2 cm with 2 nodules measuring 5.3 and 2.4 cm.  3 of these nodules were reported to be suspicious and FNA was recommended.  His biopsy results are negative.  The sample from the right sided nodule required Afirma testing which returned approximately 4% risk of malignancy, essentially benign.    Patient reports problem swallowing his pills, however no problem  swallowing his food or drinks. Thyroid function was normal in February 2022.   He denies any family history of thyroid malignancy. Is not accompanied by his aide from nursing home, provide his own history. His medical history also includes type 2 diabetes on management with Januvia, metformin.  He also has hyperlipidemia and hypertension on treatment. -He reports steady weight, no recent significant weight change.  Denies any tremors, heat intolerance, palpitations, no diaphoresis.    Review of Systems  Constitutional: no recent weight gain/loss, no fatigue, no subjective hyperthermia, no subjective hypothermia   Objective:    Vitals with BMI 07/13/2021 06/30/2021 06/18/2021  Height - - 5\' 6"   Weight - - 172 lbs 2 oz  BMI - - 51.88  Systolic  322 025 427  Diastolic 68 64 72  Pulse 56 81 86    BP 118/68   Pulse (!) 56   Wt Readings from Last 3 Encounters:  06/18/21 172 lb 1.6 oz (78.1 kg)  05/28/21 173 lb (78.5 kg)  04/29/21 174 lb 9.6 oz (79.2 kg)    Physical Exam  Constitutional:  There is no height or weight on file to calculate BMI.,  not in acute distress, normal state of mind, + wheelchair-bound with left-sided hemiparesis.   CMP ( most recent) CMP     Component Value Date/Time   NA 139 01/08/2021 0719   K 3.7 01/08/2021 0719   CL 109 01/08/2021 0719   CO2 24 01/08/2021 0719   GLUCOSE 99 01/08/2021 0719   BUN 11 01/08/2021 0719   CREATININE 0.62 01/08/2021 0719   CALCIUM 8.9 01/08/2021 0719   CALCIUM 11.5 (H) 09/05/2017 0709   PROT 6.7 01/08/2021 0719   ALBUMIN 2.9 (L) 01/08/2021 0719   AST 9 (L) 01/08/2021 0719   ALT 12 01/08/2021 0719   ALKPHOS 65 01/08/2021 0719   BILITOT 0.5 01/08/2021 0719   GFRNONAA >60 01/08/2021 0719   GFRAA >60 04/06/2020 0630     Diabetic Labs (most recent): Lab Results  Component Value Date   HGBA1C 4.9 05/03/2021   HGBA1C 5.1 01/08/2021   HGBA1C 7.5 (H) 09/10/2020     Lipid Panel ( most recent) Lipid Panel      Component Value Date/Time   CHOL 117 05/03/2021 0933   TRIG 125 05/03/2021 0933   HDL 28 (L) 05/03/2021 0933   CHOLHDL 4.2 05/03/2021 0933   VLDL 25 05/03/2021 0933   LDLCALC 64 05/03/2021 0933      Lab Results  Component Value Date   TSH 1.233 09/10/2020   TSH 0.811 03/28/2020   TSH 1.925 03/08/2019   TSH 1.524 02/06/2018   TSH 0.913 07/17/2017    Thyroid ultrasound on April 13, 2021.  Right lobe: 8.0 x 5.1 x 6.6 cm, 2 nodules measuring 6.1 cm and 1.7 cm   Left lobe: 7.2 x 3.9 x 4.7 cm, 2 nodules measuring 5.3 cm and 2.4 cm  IMPRESSION: 1. Enlarged, multinodular thyroid gland. 2. Nodule labeled 2, 3 and 4 all meet criteria for biopsy. Current recommendation is to only biopsy the 2 most suspicious nodules. Therefore nodules labeled 2 and 4 would be recommended for percutaneous biopsy.  Fine-needle aspiration biopsy on June 10, 2021: Left-sided nodule negative.  Right thyroid nodule atypia of undetermined significance, Afirma negative.  Assessment & Plan:   1. Multinodular goiter  I discussed his fine-needle aspiration biopsy findings with him and his caretaker.  He has bilateral nodules, no malignancy.  He will not need surgical intervention at this time.His presentation is consistent with euthyroid multinodular goiter.  No need for prescription for thyroid hormone at this time. He will return in 1 year with repeat thyroid function tests.    - I did not initiate any new prescriptions today. - he is advised to maintain close follow up with Joe Fee, NP for primary care needs.   I spent 21 minutes in the care of the patient today including review of labs from Thyroid Function, CMP, and other relevant labs ; imaging/biopsy records (current and previous including abstractions from other facilities); face-to-face time discussing  his lab results and symptoms, medications doses, his options of short and long term treatment based on the latest standards of care /  guidelines;   and  documenting the encounter.  Isabell Jarvis  participated in the discussions, expressed understanding, and voiced agreement with the above plans.  All questions were answered to his satisfaction. he is encouraged to contact clinic should he have any questions or concerns prior to his return visit.   Follow up plan: Return in about 1 year (around 07/13/2022) for F/U with Pre-visit Labs.   Glade Lloyd, MD [facility] [address] [place], [place] [address] Phone: [phone]  Fax: [phone]     07/13/2021, 6:41 PM  This note was partially dictated with voice recognition software. Similar sounding words can be transcribed inadequately or may not  be corrected upon review.

## 2021-08-03 ENCOUNTER — Encounter: Payer: Self-pay | Admitting: Adult Health

## 2021-08-03 ENCOUNTER — Non-Acute Institutional Stay (SKILLED_NURSING_FACILITY): Payer: Medicaid Other | Admitting: Adult Health

## 2021-08-03 DIAGNOSIS — D509 Iron deficiency anemia, unspecified: Secondary | ICD-10-CM

## 2021-08-03 DIAGNOSIS — K219 Gastro-esophageal reflux disease without esophagitis: Secondary | ICD-10-CM

## 2021-08-03 DIAGNOSIS — E1159 Type 2 diabetes mellitus with other circulatory complications: Secondary | ICD-10-CM | POA: Diagnosis not present

## 2021-08-03 DIAGNOSIS — E042 Nontoxic multinodular goiter: Secondary | ICD-10-CM

## 2021-08-03 DIAGNOSIS — I152 Hypertension secondary to endocrine disorders: Secondary | ICD-10-CM

## 2021-08-03 DIAGNOSIS — E1149 Type 2 diabetes mellitus with other diabetic neurological complication: Secondary | ICD-10-CM

## 2021-08-03 DIAGNOSIS — I69354 Hemiplegia and hemiparesis following cerebral infarction affecting left non-dominant side: Secondary | ICD-10-CM

## 2021-08-03 DIAGNOSIS — K5909 Other constipation: Secondary | ICD-10-CM

## 2021-08-03 DIAGNOSIS — E43 Unspecified severe protein-calorie malnutrition: Secondary | ICD-10-CM

## 2021-08-03 DIAGNOSIS — I480 Paroxysmal atrial fibrillation: Secondary | ICD-10-CM | POA: Diagnosis not present

## 2021-08-03 DIAGNOSIS — I63411 Cerebral infarction due to embolism of right middle cerebral artery: Secondary | ICD-10-CM

## 2021-08-03 DIAGNOSIS — E1169 Type 2 diabetes mellitus with other specified complication: Secondary | ICD-10-CM

## 2021-08-03 DIAGNOSIS — R569 Unspecified convulsions: Secondary | ICD-10-CM

## 2021-08-03 DIAGNOSIS — E1142 Type 2 diabetes mellitus with diabetic polyneuropathy: Secondary | ICD-10-CM

## 2021-08-03 DIAGNOSIS — E785 Hyperlipidemia, unspecified: Secondary | ICD-10-CM

## 2021-08-03 DIAGNOSIS — E559 Vitamin D deficiency, unspecified: Secondary | ICD-10-CM

## 2021-08-03 NOTE — Progress Notes (Signed)
Location:  [place] Room Number: 127-D Place of Service:  SNF (31)   CODE STATUS: DNR  Allergies  Allergen Reactions   Cheese    Penicillins Hives    Has patient had a PCN reaction causing immediate rash, facial/tongue/throat swelling, SOB or lightheadedness with hypotension: Unknown Has patient had a PCN reaction causing severe rash involving mucus membranes or skin necrosis: Unknown Has patient had a PCN reaction that required hospitalization: No Has patient had a PCN reaction occurring within the last 10 years: No If all of the above answers are "NO", then may proceed with Cephalosporin use.     Chief Complaint  Patient presents with   Annual Exam             HPI:  He is a 60 year old long term resident of this facility being seen for his annual exam. There have been no hospitalizations. There have been no ED visits. His overall weight is stable. There are no indications of uncontrolled pain. He denies any anxiety or depressive thoughts. He continues to be followed for his chronic illnesses including:   AF (paroxsymal atrial fibrillation) Dyslipidemia associated with type 2 diabetes mellitus:  Hypertension associated with type 2 diabetes mellitus: Type 2 diabetes mellitus with neurological complications:  Past Medical History:  Diagnosis Date   DM (diabetes mellitus) ([place])    History of seizures    Hypertension    Stroke [facility])     Past Surgical History:  Procedure Laterality Date   CRANIECTOMY Right 04/01/2017   Procedure: RIGHT DECOMPRESSIVE CRANIECTOMY;  Surgeon: Ditty, Kevan Ny, MD;  Location: [place];  Service: Neurosurgery;  Laterality: Right;   ESOPHAGOGASTRODUODENOSCOPY N/A 04/13/2017   Procedure: ESOPHAGOGASTRODUODENOSCOPY (EGD);  Surgeon: Georganna Skeans, MD;  Location: [place];  Service: General;  Laterality: N/A;  bedside   INCISION AND DRAINAGE PERIRECTAL ABSCESS N/A 09/28/2017   Procedure: IRRIGATION AND DEBRIDEMENT PERIANAL  ABSCESS;  Surgeon: Virl Cagey, MD;  Location: [facility];  Service: General;  Laterality: N/A;   PEG PLACEMENT N/A 04/13/2017   Procedure: PERCUTANEOUS ENDOSCOPIC GASTROSTOMY (PEG) PLACEMENT;  Surgeon: Georganna Skeans, MD;  Location: [facility] ENDOSCOPY;  Service: General;  Laterality: N/A;    Social History   Socioeconomic History   Marital status: Unknown    Spouse name: Not on file   Number of children: Not on file   Years of education: Not on file   Highest education level: Not on file  Occupational History   Not on file  Tobacco Use   Smoking status: Former    Types: Cigarettes   Smokeless tobacco: Never   Tobacco comments:    UTA  Vaping Use   Vaping Use: Never used  Substance and Sexual Activity   Alcohol use: No    Comment: UTA   Drug use: No    Comment: UTA   Sexual activity: Not Currently    Birth control/protection: None  Other Topics Concern   Not on file  Social History Narrative   Not on file   Social Determinants of Health   Financial Resource Strain: Not on file  Food Insecurity: Not on file  Transportation Needs: Not on file  Physical Activity: Not on file  Stress: Not on file  Social Connections: Not on file  Intimate Partner Violence: Not on file   Family History  Problem Relation Age of Onset   Hypertension Father       VITAL SIGNS BP 114/61    Pulse 64  Temp 98.2 F (36.8 C)    Resp 16    Ht 5\' 6"  (1.676 m)    Wt 172 lb 1.6 oz (78.1 kg)    SpO2 95%    BMI 27.78 kg/m   Outpatient Encounter Medications as of 08/03/2021  Medication Sig   acetaminophen (TYLENOL) 325 MG tablet Take 650 mg by mouth every 4 (four) hours as needed. Not to exceed 3 grams in 24 hours   apixaban (ELIQUIS) 5 MG TABS tablet Take 5 mg by mouth 2 (two) times daily.    atorvastatin (LIPITOR) 40 MG tablet Take 40 mg by mouth every evening.   baclofen (LIORESAL) 10 MG tablet Take 5 mg by mouth 3 (three) times daily.   bisacodyl (DULCOLAX) 10 MG suppository Place 1  suppository (10 mg total) rectally daily as needed for moderate constipation.   Cholecalciferol 1.25 MG (50000 UT) capsule Take 50,000 Units by mouth once a week. For Low Vitamin D Level on Monday   Dextromethorphan-guaiFENesin (ROBITUSSIN COUGH+CHEST CONG DM PO) Take 10 mLs by mouth every 6 (six) hours as needed.   docusate (COLACE) 50 MG/5ML liquid Give 10 ml by mouth twice a day   gabapentin (NEURONTIN) 100 MG capsule Take 100 mg by mouth at bedtime.   levETIRAcetam (KEPPRA) 100 MG/ML solution Take 500 mg by mouth 2 (two) times daily. For Seizures Mix in Grape Juice   LORazepam (ATIVAN) 2 MG/ML injection Inject 0.5 mLs (1 mg total) into the muscle every 15 (fifteen) minutes as needed. Up to 3 doses for uncontrolled seizure activity   Menthol, Topical Analgesic, (BIOFREEZE) 4 % GEL Apply 1 application topically 2 (two) times daily. Apply to left shoulder   metFORMIN (GLUCOPHAGE) 500 MG tablet Take by mouth 2 (two) times daily with a meal.   metoprolol tartrate (LOPRESSOR) 25 MG tablet Take 12.5 mg by mouth daily.   NON FORMULARY Regular Diet   omeprazole (PRILOSEC) 40 MG capsule Take 40 mg by mouth daily.    sitaGLIPtin (JANUVIA) 50 MG tablet Take 50 mg by mouth daily.   [DISCONTINUED] sitaGLIPtin (JANUVIA) 100 MG tablet Take 100 mg by mouth daily. (Patient not taking: Reported on 08/03/2021)   No facility-administered encounter medications on file as of 08/03/2021.     SIGNIFICANT DIAGNOSTIC EXAMS   PREVIOUS   06-10-21: thyroid biopsy   NO NEW EXAMS.   LABS REVIEWED PREVIOUS:    08-06-20: Vit D  40.07 09-10-20: glucose 163; bun 8; creat 0.60; k+ 3.7; na++ 138; ca 9.0 GFR>60; liver normal albumin 2.7; hgb a1c 7.5; chol 115; ldl 62; trig 108; hdl 31; tsh 1.233  01-28-21: wbc 11.9; hgb 10.8; hct 37.7; mcv 87.9 plt 208; glucose 99; bun 11; creat 0.63; k+ 3.7; na++ 139; ca 8.9; GFR >60; liver normal albumin 2.9; hgb a1c 5.1   05-03-21: hgb a1c 4.9; chol 117; ldl 64; trig 125; hdl 28; mag  2.0; vit D 38.55 micro albumin 92.8  NO NEW LABS.    Review of Systems  Constitutional:  Negative for malaise/fatigue.  Respiratory:  Negative for cough and shortness of breath.   Cardiovascular:  Negative for chest pain, palpitations and leg swelling.  Gastrointestinal:  Negative for abdominal pain, constipation and heartburn.  Musculoskeletal:  Negative for back pain, joint pain and myalgias.  Skin: Negative.   Neurological:  Negative for dizziness.  Psychiatric/Behavioral:  The patient is not nervous/anxious.    Physical Exam Constitutional:      General: He is not in acute distress.  Appearance: He is well-developed. He is not diaphoretic.  HENT:     Nose: Nose normal.     Mouth/Throat:     Mouth: Mucous membranes are moist.     Pharynx: Oropharynx is clear.  Eyes:     Conjunctiva/sclera: Conjunctivae normal.  Neck:     Thyroid: Thyroid mass and thyromegaly present.  Cardiovascular:     Rate and Rhythm: Normal rate and regular rhythm.     Pulses: Normal pulses.     Heart sounds: Normal heart sounds.  Pulmonary:     Effort: Pulmonary effort is normal. No respiratory distress.     Breath sounds: Normal breath sounds.  Abdominal:     General: Bowel sounds are normal. There is no distension.     Palpations: Abdomen is soft.     Tenderness: There is no abdominal tenderness.  Musculoskeletal:     Cervical back: Neck supple.     Right lower leg: No edema.     Left lower leg: No edema.     Comments:  Left hemiplegia    Lymphadenopathy:     Cervical: No cervical adenopathy.  Skin:    General: Skin is warm and dry.  Neurological:     Mental Status: He is alert. Mental status is at baseline.  Psychiatric:        Mood and Affect: Mood normal.       ASSESSMENT/ PLAN:  TODAY;   AF (paroxsymal atrial fibrillation) heart rate is stable will continue lopressor 12.5 mg twice daily for rate control and eliquis 5 mg twice daily   2. Dyslipidemia associated with type 2  diabetes mellitus: is stable LDL 64 will continue lipitor 40 mg daily   3. Hypertension associated with type 2 diabetes mellitus: is stable b/p 114/61 will continue lopressor 12.5 mg twice daily   4. Type 2 diabetes mellitus with neurological complications: is stable hgb a1c 4.9 will continue metformin 500 mg twice daily and januvia 50 mg daily   5. Hypochromic anemia: is stable hgb 11.9 will monitor   6. Protein calorie malnutrition severe; is stable albumin 2.9 will continue supplements as directed  7. Diabetic peripheral neuropathy: is stable will continue gabapentin 100 mg nightly   8. Seizure: is stable without recent activity; will continue keppra 500 mg twice daily   9. Chronic constipation: is stable will continue colace twice daily   10. Cerebrovascular accident (CVA) due to embolism of right middle cerebral artery: is s/p compression of right  frontotemporoparteital craniotomy with durplasty 2018: is stable will continue eliquis 5 mg twice daily   11. Vitamin D deficiency: is stable 38.55 will continue 50,000 units weekly   12. 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 GERD without esophagitis is stable will continue prilosec 40 mg daily   14. Multinodular goiter; is status post aspiration      Ok Edwards NP [facility]   call [phone]

## 2021-08-06 ENCOUNTER — Encounter: Payer: Self-pay | Admitting: Adult Health

## 2021-08-06 ENCOUNTER — Non-Acute Institutional Stay (SKILLED_NURSING_FACILITY): Payer: Medicaid Other | Admitting: Adult Health

## 2021-08-06 ENCOUNTER — Encounter (HOSPITAL_COMMUNITY)
Admission: RE | Admit: 2021-08-06 | Discharge: 2021-08-06 | Disposition: A | Discharge status: Home / Self Care | Payer: Medicaid Other | Source: Skilled Nursing Facility | Attending: Adult Health | Admitting: Adult Health

## 2021-08-06 DIAGNOSIS — U071 COVID-19: Secondary | ICD-10-CM

## 2021-08-06 DIAGNOSIS — E119 Type 2 diabetes mellitus without complications: Secondary | ICD-10-CM

## 2021-08-06 LAB — BASIC METABOLIC PANEL
Anion gap: 4 — ABNORMAL LOW (ref 5–15)
BUN: 11 mg/dL (ref 6–20)
CO2: 26 mmol/L (ref 22–32)
Calcium: 8.5 mg/dL — ABNORMAL LOW (ref 8.9–10.3)
Chloride: 106 mmol/L (ref 98–111)
Creatinine, Ser: 0.77 mg/dL (ref 0.61–1.24)
GFR, Estimated: >60 mL/min (ref >60)
Glucose, Bld: 118 mg/dL — ABNORMAL HIGH (ref 70–99)
Potassium: 3.1 mmol/L — ABNORMAL LOW (ref 3.5–5.1)
Sodium: 136 mmol/L (ref 135–145)

## 2021-08-06 LAB — CBC WITH DIFFERENTIAL/PLATELET
Abs Immature Granulocytes: 0.05 10*3/uL (ref 0.00–0.07)
Basophils Absolute: 0.1 10*3/uL (ref 0.0–0.1)
Basophils Relative: 1 %
Eosinophils Absolute: 0.1 10*3/uL (ref 0.0–0.5)
Eosinophils Relative: 1 %
HCT: 37.8 % — ABNORMAL LOW (ref 39.0–52.0)
Hemoglobin: 10.8 g/dL — ABNORMAL LOW (ref 13.0–17.0)
Immature Granulocytes: 1 %
Lymphocytes Relative: 31 %
Lymphs Abs: 3.1 10*3/uL (ref 0.7–4.0)
MCH: 22 pg — ABNORMAL LOW (ref 26.0–34.0)
MCHC: 28.6 g/dL — ABNORMAL LOW (ref 30.0–36.0)
MCV: 76.8 fL — ABNORMAL LOW (ref 80.0–100.0)
Monocytes Absolute: 1.8 10*3/uL — ABNORMAL HIGH (ref 0.1–1.0)
Monocytes Relative: 18 %
Neutro Abs: 4.9 10*3/uL (ref 1.7–7.7)
Neutrophils Relative %: 48 %
Platelets: 249 10*3/uL (ref 150–400)
RBC: 4.92 MIL/uL (ref 4.22–5.81)
RDW: 19.7 % — ABNORMAL HIGH (ref 11.5–15.5)
WBC: 10 10*3/uL (ref 4.0–10.5)
nRBC: 0.2 % (ref 0.0–0.2)

## 2021-08-06 NOTE — Progress Notes (Signed)
Location:  [place] Room Number: 127-D Place of Service:  SNF (31)   CODE STATUS: DNR  Allergies  Allergen Reactions   Cheese    Penicillins Hives    Has patient had a PCN reaction causing immediate rash, facial/tongue/throat swelling, SOB or lightheadedness with hypotension: Unknown Has patient had a PCN reaction causing severe rash involving mucus membranes or skin necrosis: Unknown Has patient had a PCN reaction that required hospitalization: No Has patient had a PCN reaction occurring within the last 10 years: No If all of the above answers are "NO", then may proceed with Cephalosporin use.     Chief Complaint  Patient presents with   Acute Visit    COVID    HPI:  He has tested positive for COVID. He has been having sneezing; sore throat; cough; nausea and headache. There are no reports of fevers present.   Past Medical History:  Diagnosis Date   DM (diabetes mellitus) ([place])    History of seizures    Hypertension    Stroke [facility])     Past Surgical History:  Procedure Laterality Date   CRANIECTOMY Right 04/01/2017   Procedure: RIGHT DECOMPRESSIVE CRANIECTOMY;  Surgeon: Ditty, Kevan Ny, MD;  Location: [place];  Service: Neurosurgery;  Laterality: Right;   ESOPHAGOGASTRODUODENOSCOPY N/A 04/13/2017   Procedure: ESOPHAGOGASTRODUODENOSCOPY (EGD);  Surgeon: Georganna Skeans, MD;  Location: [place];  Service: General;  Laterality: N/A;  bedside   INCISION AND DRAINAGE PERIRECTAL ABSCESS N/A 09/28/2017   Procedure: IRRIGATION AND DEBRIDEMENT PERIANAL ABSCESS;  Surgeon: Virl Cagey, MD;  [facility]: [facility];  Service: General;  Laterality: N/A;   PEG PLACEMENT N/A 04/13/2017   Procedure: PERCUTANEOUS ENDOSCOPIC GASTROSTOMY (PEG) PLACEMENT;  Surgeon: Georganna Skeans, MD;  Location: [facility] ENDOSCOPY;  Service: General;  Laterality: N/A;    Social History   Socioeconomic History   Marital status: Unknown    Spouse name: Not on file   Number of  children: Not on file   Years of education: Not on file   Highest education level: Not on file  Occupational History   Not on file  Tobacco Use   Smoking status: Former    Types: Cigarettes   Smokeless tobacco: Never   Tobacco comments:    UTA  Vaping Use   Vaping Use: Never used  Substance and Sexual Activity   Alcohol use: No    Comment: UTA   Drug use: No    Comment: UTA   Sexual activity: Not Currently    Birth control/protection: None  Other Topics Concern   Not on file  Social History Narrative   Not on file   Social Determinants of Health   Financial Resource Strain: Not on file  Food Insecurity: Not on file  Transportation Needs: Not on file  Physical Activity: Not on file  Stress: Not on file  Social Connections: Not on file  Intimate Partner Violence: Not on file   Family History  Problem Relation Age of Onset   Hypertension Father       VITAL SIGNS BP 114/61    Pulse 64    Temp 98.6 F (37 C)    Resp 16    Ht 5\' 6"  (1.676 m)    Wt 172 lb 1.6 oz (78.1 kg)    SpO2 95%    BMI 27.78 kg/m   Outpatient Encounter Medications as of 08/06/2021  Medication Sig   acetaminophen (TYLENOL) 325 MG tablet Take 650 mg by mouth every  4 (four) hours as needed. Not to exceed 3 grams in 24 hours   apixaban (ELIQUIS) 5 MG TABS tablet Take 5 mg by mouth 2 (two) times daily.    atorvastatin (LIPITOR) 40 MG tablet Take 40 mg by mouth every evening.   baclofen (LIORESAL) 10 MG tablet Take 5 mg by mouth 3 (three) times daily.   bisacodyl (DULCOLAX) 10 MG suppository Place 1 suppository (10 mg total) rectally daily as needed for moderate constipation.   Cholecalciferol 1.25 MG (50000 UT) capsule Take 50,000 Units by mouth once a week. For Low Vitamin D Level on Monday   Dextromethorphan-guaiFENesin (ROBITUSSIN COUGH+CHEST CONG DM PO) Take 10 mLs by mouth every 6 (six) hours as needed.   docusate (COLACE) 50 MG/5ML liquid Give 10 ml by mouth twice a day   gabapentin (NEURONTIN)  100 MG capsule Take 100 mg by mouth at bedtime.   levETIRAcetam (KEPPRA) 100 MG/ML solution Take 500 mg by mouth 2 (two) times daily. For Seizures Mix in Grape Juice   LORazepam (ATIVAN) 2 MG/ML injection Inject 0.5 mLs (1 mg total) into the muscle every 15 (fifteen) minutes as needed. Up to 3 doses for uncontrolled seizure activity   Menthol, Topical Analgesic, (BIOFREEZE) 4 % GEL Apply 1 application topically 2 (two) times daily. Apply to left shoulder   metFORMIN (GLUCOPHAGE) 500 MG tablet Take by mouth 2 (two) times daily with a meal.   metoprolol tartrate (LOPRESSOR) 25 MG tablet Take 12.5 mg by mouth daily.   NON FORMULARY Regular Diet   omeprazole (PRILOSEC) 40 MG capsule Take 40 mg by mouth daily.    sitaGLIPtin (JANUVIA) 50 MG tablet Take 50 mg by mouth daily.   No facility-administered encounter medications on file as of 08/06/2021.     SIGNIFICANT DIAGNOSTIC EXAMS   PREVIOUS   06-10-21: thyroid biopsy   NO NEW EXAMS.   LABS REVIEWED PREVIOUS:    09-10-20: glucose 163; bun 8; creat 0.60; k+ 3.7; na++ 138; ca 9.0 GFR>60; liver normal albumin 2.7; hgb a1c 7.5; chol 115; ldl 62; trig 108; hdl 31; tsh 1.233  01-28-21: wbc 11.9; hgb 10.8; hct 37.7; mcv 87.9 plt 208; glucose 99; bun 11; creat 0.63; k+ 3.7; na++ 139; ca 8.9; GFR >60; liver normal albumin 2.9; hgb a1c 5.1   05-03-21: hgb a1c 4.9; chol 117; ldl 64; trig 125; hdl 28; mag 2.0; vit D 38.55 micro albumin 92.8  TODAY  08-06-21: wbc 10.0; hgb 10.8; hct 37.8; mcv 76.8 plt 249; glucose 118; ;bun 11; creat 0.77; k+ 3.1; na++ 136; ca 8.5; GFR >60    Review of Systems  Constitutional:  Negative for malaise/fatigue.  HENT:  Positive for sore throat.        Sneezing   Respiratory:  Positive for cough. Negative for shortness of breath.   Cardiovascular:  Negative for chest pain, palpitations and leg swelling.  Gastrointestinal:  Positive for nausea. Negative for abdominal pain, constipation, heartburn and vomiting.   Musculoskeletal:  Negative for back pain, joint pain and myalgias.  Skin: Negative.   Neurological:  Positive for headaches. Negative for dizziness.  Psychiatric/Behavioral:  The patient is not nervous/anxious.    Physical Exam Constitutional:      General: He is not in acute distress.    Appearance: He is well-developed. He is not diaphoretic.  Neck:     Thyroid: Thyroid mass and thyromegaly present.  Cardiovascular:     Rate and Rhythm: Normal rate and regular rhythm.  Pulses: Normal pulses.     Heart sounds: Normal heart sounds.  Pulmonary:     Effort: Pulmonary effort is normal. No respiratory distress.     Breath sounds: Normal breath sounds.  Abdominal:     General: Bowel sounds are normal. There is no distension.     Palpations: Abdomen is soft.     Tenderness: There is no abdominal tenderness.  Musculoskeletal:     Cervical back: Neck supple.     Comments: Left hemiplegia   Lymphadenopathy:     Cervical: No cervical adenopathy.  Skin:    General: Skin is warm and dry.  Neurological:     Mental Status: He is alert. Mental status is at baseline.  Psychiatric:        Mood and Affect: Mood normal.      ASSESSMENT/ PLAN:  TODAY  Covid 19 with comorbidity diabetes mellitus type 2  Is worse will begin the following: Cbc bmp d-dimer crp already done Will begin molnupiravir 800 mg twice daily for 5 days Will begin zinc; vitamin C and D for 2 weeks.  Will monitor his status   Ok Edwards NP [facility]  Contact [phone] Monday through Friday 8am- 5pm  After hours call [phone]

## 2021-08-07 LAB — D-DIMER, QUANTITATIVE: D-Dimer, Quant: 0.36 ug/mL-FEU (ref 0.00–0.50)

## 2021-08-07 LAB — C-REACTIVE PROTEIN: CRP: 1.6 mg/dL — ABNORMAL HIGH (ref <1.0)

## 2021-08-10 DIAGNOSIS — U071 COVID-19: Secondary | ICD-10-CM | POA: Insufficient documentation

## 2021-08-10 DIAGNOSIS — E119 Type 2 diabetes mellitus without complications: Secondary | ICD-10-CM | POA: Insufficient documentation

## 2021-08-13 ENCOUNTER — Non-Acute Institutional Stay (SKILLED_NURSING_FACILITY): Payer: Medicaid Other | Admitting: Adult Health

## 2021-08-13 ENCOUNTER — Encounter: Payer: Self-pay | Admitting: Adult Health

## 2021-08-13 ENCOUNTER — Other Ambulatory Visit (HOSPITAL_COMMUNITY)
Admission: RE | Admit: 2021-08-13 | Discharge: 2021-08-13 | Disposition: A | Discharge status: Home / Self Care | Payer: Medicaid Other | Source: Skilled Nursing Facility | Attending: Adult Health | Admitting: Adult Health

## 2021-08-13 DIAGNOSIS — I152 Hypertension secondary to endocrine disorders: Secondary | ICD-10-CM | POA: Insufficient documentation

## 2021-08-13 DIAGNOSIS — U071 COVID-19: Secondary | ICD-10-CM

## 2021-08-13 DIAGNOSIS — E1159 Type 2 diabetes mellitus with other circulatory complications: Secondary | ICD-10-CM

## 2021-08-13 DIAGNOSIS — E119 Type 2 diabetes mellitus without complications: Secondary | ICD-10-CM

## 2021-08-13 DIAGNOSIS — E1149 Type 2 diabetes mellitus with other diabetic neurological complication: Secondary | ICD-10-CM | POA: Diagnosis not present

## 2021-08-13 LAB — BASIC METABOLIC PANEL
Anion gap: 7 (ref 5–15)
BUN: 8 mg/dL (ref 6–20)
CO2: 24 mmol/L (ref 22–32)
Calcium: 8.3 mg/dL — ABNORMAL LOW (ref 8.9–10.3)
Chloride: 109 mmol/L (ref 98–111)
Creatinine, Ser: 0.69 mg/dL (ref 0.61–1.24)
GFR, Estimated: >60 mL/min (ref >60)
Glucose, Bld: 69 mg/dL — ABNORMAL LOW (ref 70–99)
Potassium: 3.4 mmol/L — ABNORMAL LOW (ref 3.5–5.1)
Sodium: 140 mmol/L (ref 135–145)

## 2021-08-13 LAB — CBC
HCT: 36.2 % — ABNORMAL LOW (ref 39.0–52.0)
Hemoglobin: 10.3 g/dL — ABNORMAL LOW (ref 13.0–17.0)
MCH: 22 pg — ABNORMAL LOW (ref 26.0–34.0)
MCHC: 28.5 g/dL — ABNORMAL LOW (ref 30.0–36.0)
MCV: 77.4 fL — ABNORMAL LOW (ref 80.0–100.0)
Platelets: 251 10*3/uL (ref 150–400)
RBC: 4.68 MIL/uL (ref 4.22–5.81)
RDW: 20.2 % — ABNORMAL HIGH (ref 11.5–15.5)
WBC: 11.9 10*3/uL — ABNORMAL HIGH (ref 4.0–10.5)
nRBC: 0.2 % (ref 0.0–0.2)

## 2021-08-13 NOTE — Progress Notes (Signed)
Location:  [place] Room Number: 117-D Place of Service:  SNF (31)   CODE STATUS: DNR  Allergies  Allergen Reactions   Cheese    Penicillins Hives    Has patient had a PCN reaction causing immediate rash, facial/tongue/throat swelling, SOB or lightheadedness with hypotension: Unknown Has patient had a PCN reaction causing severe rash involving mucus membranes or skin necrosis: Unknown Has patient had a PCN reaction that required hospitalization: No Has patient had a PCN reaction occurring within the last 10 years: No If all of the above answers are "NO", then may proceed with Cephalosporin use.     Chief Complaint  Patient presents with   Acute Visit    Change in status    HPI:  He has been diagnosed with covid 72. Staff report that his cbg readings are low in the afternoon and that his blood pressure readings are low as well. His po intake is poor. There are no reports of fevers; no reports of uncontrolled pain.   Past Medical History:  Diagnosis Date   DM (diabetes mellitus) ([place])    History of seizures    Hypertension    Stroke [facility])     Past Surgical History:  Procedure Laterality Date   CRANIECTOMY Right 04/01/2017   Procedure: RIGHT DECOMPRESSIVE CRANIECTOMY;  Surgeon: Ditty, Kevan Ny, MD;  Location: [place];  Service: Neurosurgery;  Laterality: Right;   ESOPHAGOGASTRODUODENOSCOPY N/A 04/13/2017   Procedure: ESOPHAGOGASTRODUODENOSCOPY (EGD);  Surgeon: Georganna Skeans, MD;  Location: [place];  Service: General;  Laterality: N/A;  bedside   INCISION AND DRAINAGE PERIRECTAL ABSCESS N/A 09/28/2017   Procedure: IRRIGATION AND DEBRIDEMENT PERIANAL ABSCESS;  Surgeon: Virl Cagey, MD;  Location: [facility];  Service: General;  Laterality: N/A;   PEG PLACEMENT N/A 04/13/2017   Procedure: PERCUTANEOUS ENDOSCOPIC GASTROSTOMY (PEG) PLACEMENT;  Surgeon: Georganna Skeans, MD;  Location: [facility] ENDOSCOPY;  Service: General;  Laterality: N/A;     Social History   Socioeconomic History   Marital status: Unknown    Spouse name: Not on file   Number of children: Not on file   Years of education: Not on file   Highest education level: Not on file  Occupational History   Not on file  Tobacco Use   Smoking status: Former    Types: Cigarettes   Smokeless tobacco: Never   Tobacco comments:    UTA  Vaping Use   Vaping Use: Never used  Substance and Sexual Activity   Alcohol use: No    Comment: UTA   Drug use: No    Comment: UTA   Sexual activity: Not Currently    Birth control/protection: None  Other Topics Concern   Not on file  Social History Narrative   Not on file   Social Determinants of Health   Financial Resource Strain: Not on file  Food Insecurity: Not on file  Transportation Needs: Not on file  Physical Activity: Not on file  Stress: Not on file  Social Connections: Not on file  Intimate Partner Violence: Not on file   Family History  Problem Relation Age of Onset   Hypertension Father       VITAL SIGNS BP (!) 110/50    Pulse 85    Temp 98.2 F (36.8 C)    Resp 20    Ht 5\' 6"  (1.676 m)    Wt 166 lb 3.2 oz (75.4 kg)    SpO2 98%    BMI 26.83 kg/m  Outpatient Encounter Medications as of 08/13/2021  Medication Sig   acetaminophen (TYLENOL) 325 MG tablet Take 650 mg by mouth every 4 (four) hours as needed. Not to exceed 3 grams in 24 hours   apixaban (ELIQUIS) 5 MG TABS tablet Take 5 mg by mouth 2 (two) times daily.    atorvastatin (LIPITOR) 40 MG tablet Take 40 mg by mouth every evening.   Baclofen 5 MG TABS Take 5 mg by mouth in the morning and at bedtime.   bisacodyl (DULCOLAX) 10 MG suppository Place 1 suppository (10 mg total) rectally daily as needed for moderate constipation.   Cholecalciferol 1.25 MG (50000 UT) capsule Take 50,000 Units by mouth once a week. For Low Vitamin D Level on Monday   Dextromethorphan-guaiFENesin (ROBITUSSIN COUGH+CHEST CONG DM PO) Take 10 mLs by mouth every 6  (six) hours as needed.   docusate (COLACE) 50 MG/5ML liquid Give 10 ml by mouth twice a day   gabapentin (NEURONTIN) 100 MG capsule Take 100 mg by mouth at bedtime.   levETIRAcetam (KEPPRA) 100 MG/ML solution Take 500 mg by mouth 2 (two) times daily. For Seizures Mix in Grape Juice   LORazepam (ATIVAN) 2 MG/ML injection Inject 0.5 mLs (1 mg total) into the muscle every 15 (fifteen) minutes as needed. Up to 3 doses for uncontrolled seizure activity   Menthol, Topical Analgesic, (BIOFREEZE) 4 % GEL Apply 1 application topically 2 (two) times daily. Apply to left shoulder   metoprolol tartrate (LOPRESSOR) 25 MG tablet Take 12.5 mg by mouth daily.   NON FORMULARY Regular Diet   omeprazole (PRILOSEC) 40 MG capsule Take 40 mg by mouth daily.    sitaGLIPtin (JANUVIA) 50 MG tablet Take 50 mg by mouth daily.   [DISCONTINUED] baclofen (LIORESAL) 10 MG tablet Take 5 mg by mouth 3 (three) times daily.   [DISCONTINUED] metFORMIN (GLUCOPHAGE) 500 MG tablet Take by mouth 2 (two) times daily with a meal.   No facility-administered encounter medications on file as of 08/13/2021.     SIGNIFICANT DIAGNOSTIC EXAMS   PREVIOUS   06-10-21: thyroid biopsy   NO NEW EXAMS.   LABS REVIEWED PREVIOUS:    09-10-20: glucose 163; bun 8; creat 0.60; k+ 3.7; na++ 138; ca 9.0 GFR>60; liver normal albumin 2.7; hgb a1c 7.5; chol 115; ldl 62; trig 108; hdl 31; tsh 1.233  01-28-21: wbc 11.9; hgb 10.8; hct 37.7; mcv 87.9 plt 208; glucose 99; bun 11; creat 0.63; k+ 3.7; na++ 139; ca 8.9; GFR >60; liver normal albumin 2.9; hgb a1c 5.1   05-03-21: hgb a1c 4.9; chol 117; ldl 64; trig 125; hdl 28; mag 2.0; vit D 38.55 micro albumin 92.8  TODAY  08-06-21: wbc 10.0; hgb 10.8; hct 37.8; mcv 76.8 plt 249; glucose 118; ;bun 11; creat 0.77; k+ 3.1; na++ 136; ca 8.5; GFR >60 CRP 1.6; d-dimer 0.36 08-13-21: glucose 11.9; hgb 10.3; hct 36.2; mcv 77.4 plt 251; glucose 69; bun 8; creat 0.69; k+ 3.4; na++ 140; ca 8.3; GFR >60  Review of Systems   Constitutional:  Positive for malaise/fatigue.  Respiratory:  Negative for cough and shortness of breath.   Cardiovascular:  Negative for chest pain, palpitations and leg swelling.  Gastrointestinal:  Negative for abdominal pain, constipation and heartburn.  Musculoskeletal:  Negative for back pain, joint pain and myalgias.  Skin: Negative.   Neurological:  Negative for dizziness.  Psychiatric/Behavioral:  The patient is not nervous/anxious.    Physical Exam Constitutional:      General: He is not  in acute distress.    Appearance: He is well-developed. He is not diaphoretic.  Neck:     Thyroid: Thyroid mass and thyromegaly present.  Cardiovascular:     Rate and Rhythm: Normal rate and regular rhythm.     Pulses: Normal pulses.     Heart sounds: Normal heart sounds.  Pulmonary:     Effort: Pulmonary effort is normal. No respiratory distress.     Breath sounds: Normal breath sounds.  Abdominal:     General: Bowel sounds are normal. There is no distension.     Palpations: Abdomen is soft.     Tenderness: There is no abdominal tenderness.  Musculoskeletal:     Cervical back: Neck supple.     Comments: Left hemiplegia   Lymphadenopathy:     Cervical: No cervical adenopathy.  Skin:    General: Skin is warm and dry.  Neurological:     Mental Status: He is alert. Mental status is at baseline.  Psychiatric:        Mood and Affect: Mood normal.     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    Ok Edwards NP [facility]  Contact [phone] Monday through Friday 8am- 5pm  After hours call [phone]

## 2021-09-07 ENCOUNTER — Non-Acute Institutional Stay (SKILLED_NURSING_FACILITY): Payer: Medicaid Other | Admitting: Adult Health

## 2021-09-07 ENCOUNTER — Encounter: Payer: Self-pay | Admitting: Adult Health

## 2021-09-07 DIAGNOSIS — E1169 Type 2 diabetes mellitus with other specified complication: Secondary | ICD-10-CM | POA: Diagnosis not present

## 2021-09-07 DIAGNOSIS — F22 Delusional disorders: Secondary | ICD-10-CM

## 2021-09-07 DIAGNOSIS — E1159 Type 2 diabetes mellitus with other circulatory complications: Secondary | ICD-10-CM | POA: Diagnosis not present

## 2021-09-07 DIAGNOSIS — E1149 Type 2 diabetes mellitus with other diabetic neurological complication: Secondary | ICD-10-CM

## 2021-09-07 DIAGNOSIS — E785 Hyperlipidemia, unspecified: Secondary | ICD-10-CM

## 2021-09-07 DIAGNOSIS — I152 Hypertension secondary to endocrine disorders: Secondary | ICD-10-CM

## 2021-09-07 NOTE — Progress Notes (Signed)
Location:  [place] Room Number: 45 Place of Service:  SNF (31)   CODE STATUS: dnr   Allergies  Allergen Reactions   Cheese    Penicillins Hives    Has patient had a PCN reaction causing immediate rash, facial/tongue/throat swelling, SOB or lightheadedness with hypotension: Unknown Has patient had a PCN reaction causing severe rash involving mucus membranes or skin necrosis: Unknown Has patient had a PCN reaction that required hospitalization: No Has patient had a PCN reaction occurring within the last 10 years: No If all of the above answers are "NO", then may proceed with Cephalosporin use.     Chief Complaint  Patient presents with   Medical Management of Chronic Issues            Type 2 diabetes mellitus with neurological complications:  Delusion:  Dyslipidemia associated with type 2 diabetes mellitus:  Hypertension associated with type 2 diabetes mellitus    HPI:  He is a 61 year old long term resident of this facility being seen for the management of his chronic illnesses including:  Type 2 diabetes mellitus with neurological complications:  Delusion:  Dyslipidemia associated with type 2 diabetes mellitus:  Hypertension associated with type 2 diabetes mellitus. There are no reports of uncontrolled pain; no reports of insomnia. He has had delusional thoughts to including someone stealing his identity. He has been yelling out. To me he denies all of this stating he yells out with needs.   Past Medical History:  Diagnosis Date   DM (diabetes mellitus) ([place])    History of seizures    Hypertension    Stroke [facility])     Past Surgical History:  Procedure Laterality Date   CRANIECTOMY Right 04/01/2017   Procedure: RIGHT DECOMPRESSIVE CRANIECTOMY;  Surgeon: Ditty, Kevan Ny, MD;  Location: [place];  Service: Neurosurgery;  Laterality: Right;   ESOPHAGOGASTRODUODENOSCOPY N/A 04/13/2017   Procedure: ESOPHAGOGASTRODUODENOSCOPY (EGD);  Surgeon: Georganna Skeans, MD;  Location: [place];  Service: General;  Laterality: N/A;  bedside   INCISION AND DRAINAGE PERIRECTAL ABSCESS N/A 09/28/2017   Procedure: IRRIGATION AND DEBRIDEMENT PERIANAL ABSCESS;  Surgeon: Virl Cagey, MD;  Location: [facility];  Service: General;  Laterality: N/A;   PEG PLACEMENT N/A 04/13/2017   Procedure: PERCUTANEOUS ENDOSCOPIC GASTROSTOMY (PEG) PLACEMENT;  Surgeon: Georganna Skeans, MD;  Location: [facility] ENDOSCOPY;  Service: General;  Laterality: N/A;    Social History   Socioeconomic History   Marital status: Unknown    Spouse name: Not on file   Number of children: Not on file   Years of education: Not on file   Highest education level: Not on file  Occupational History   Not on file  Tobacco Use   Smoking status: Former    Types: Cigarettes   Smokeless tobacco: Never   Tobacco comments:    UTA  Vaping Use   Vaping Use: Never used  Substance and Sexual Activity   Alcohol use: No    Comment: UTA   Drug use: No    Comment: UTA   Sexual activity: Not Currently    Birth control/protection: None  Other Topics Concern   Not on file  Social History Narrative   Not on file   Social Determinants of Health   Financial Resource Strain: Not on file  Food Insecurity: Not on file  Transportation Needs: Not on file  Physical Activity: Not on file  Stress: Not on file  Social Connections: Not on file  Intimate Partner Violence: Not on file   Family History  Problem Relation Age of Onset   Hypertension Father       VITAL SIGNS BP 100/60    Pulse 78    Temp 97.8 F (36.6 C)    Ht 5\' 6"  (1.676 m)    Wt 166 lb 3.2 oz (75.4 kg)    BMI 26.83 kg/m   Outpatient Encounter Medications as of 09/07/2021  Medication Sig   acetaminophen (TYLENOL) 325 MG tablet Take 650 mg by mouth every 4 (four) hours as needed. Not to exceed 3 grams in 24 hours   apixaban (ELIQUIS) 5 MG TABS tablet Take 5 mg by mouth 2 (two) times daily.    atorvastatin (LIPITOR) 40 MG tablet  Take 40 mg by mouth every evening.   Baclofen 5 MG TABS Take 5 mg by mouth in the morning and at bedtime.   bisacodyl (DULCOLAX) 10 MG suppository Place 1 suppository (10 mg total) rectally daily as needed for moderate constipation.   Cholecalciferol 1.25 MG (50000 UT) capsule Take 50,000 Units by mouth once a week. For Low Vitamin D Level on Monday   Dextromethorphan-guaiFENesin (ROBITUSSIN COUGH+CHEST CONG DM PO) Take 10 mLs by mouth every 6 (six) hours as needed.   docusate (COLACE) 50 MG/5ML liquid Give 10 ml by mouth twice a day   gabapentin (NEURONTIN) 100 MG capsule Take 100 mg by mouth at bedtime.   levETIRAcetam (KEPPRA) 100 MG/ML solution Take 500 mg by mouth 2 (two) times daily. For Seizures Mix in Grape Juice   LORazepam (ATIVAN) 2 MG/ML injection Inject 0.5 mLs (1 mg total) into the muscle every 15 (fifteen) minutes as needed. Up to 3 doses for uncontrolled seizure activity   Menthol, Topical Analgesic, (BIOFREEZE) 4 % GEL Apply 1 application topically 2 (two) times daily. Apply to left shoulder   metoprolol tartrate (LOPRESSOR) 25 MG tablet Take 12.5 mg by mouth daily.   NON FORMULARY Regular Diet   omeprazole (PRILOSEC) 40 MG capsule Take 40 mg by mouth daily.    sitaGLIPtin (JANUVIA) 50 MG tablet Take 50 mg by mouth daily.   No facility-administered encounter medications on file as of 09/07/2021.     SIGNIFICANT DIAGNOSTIC EXAMS   PREVIOUS   06-10-21: thyroid biopsy   NO NEW EXAMS.   LABS REVIEWED PREVIOUS:    08-06-20: Vit D  40.07 09-10-20: glucose 163; bun 8; creat 0.60; k+ 3.7; na++ 138; ca 9.0 GFR>60; liver normal albumin 2.7; hgb a1c 7.5; chol 115; ldl 62; trig 108; hdl 31; tsh 1.233  01-28-21: wbc 11.9; hgb 10.8; hct 37.7; mcv 87.9 plt 208; glucose 99; bun 11; creat 0.63; k+ 3.7; na++ 139; ca 8.9; GFR >60; liver normal albumin 2.9; hgb a1c 5.1   05-03-21: hgb a1c 4.9; chol 117; ldl 64; trig 125; hdl 28; mag 2.0; vit D 38.55 micro albumin 92.8  NO NEW LABS.     Review of Systems  Constitutional:  Negative for malaise/fatigue.  Respiratory:  Negative for cough and shortness of breath.   Cardiovascular:  Negative for chest pain, palpitations and leg swelling.  Gastrointestinal:  Negative for abdominal pain, constipation and heartburn.  Musculoskeletal:  Negative for back pain, joint pain and myalgias.  Skin: Negative.   Neurological:  Negative for dizziness.  Psychiatric/Behavioral:  The patient is not nervous/anxious.        He denies any delusional thought patterns. He states he yells out when he needs something.    Physical Exam  Constitutional:      General: He is not in acute distress.    Appearance: He is well-developed. He is not diaphoretic.  Neck:     Thyroid: Thyroid mass and thyromegaly present.  Cardiovascular:     Rate and Rhythm: Normal rate and regular rhythm.     Pulses: Normal pulses.     Heart sounds: Normal heart sounds.  Pulmonary:     Effort: Pulmonary effort is normal. No respiratory distress.     Breath sounds: Normal breath sounds.  Abdominal:     General: Bowel sounds are normal. There is no distension.     Palpations: Abdomen is soft.     Tenderness: There is no abdominal tenderness.  Musculoskeletal:     Cervical back: Neck supple.     Right lower leg: No edema.     Left lower leg: No edema.     Comments: Left hemiplegia   Lymphadenopathy:     Cervical: No cervical adenopathy.  Skin:    General: Skin is warm and dry.  Neurological:     Mental Status: He is alert. Mental status is at baseline.  Psychiatric:        Mood and Affect: Mood normal.       ASSESSMENT/ PLAN:  TODAY;   Type 2 diabetes mellitus with neurological complications: is stable hgb 4.9 will continue metformin 500 mg twice daily and will stop januvia at this time   2. 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   3. Dyslipidemia associated with type 2 diabetes mellitus: is stable  LDL 64 will continue lipitor 40 mg daily   4. Hypertension associated with type 2 diabetes mellitus: is stable b/p 100/60 will continue lopressor 12.5 mg twice daily   PREVIOUS   5. AF (paroxsymal atrial fibrillation) heart rate is stable will continue lopressor 12.5 mg twice daily for rate control and eliquis 5 mg twice daily   6. Hypochromic anemia: is stable hgb 11.9 will monitor   7. Protein calorie malnutrition severe; is stable albumin 2.9 will continue supplements as directed  8. Diabetic peripheral neuropathy: is stable will continue gabapentin 100 mg nightly   9. Seizure: is stable without recent activity; will continue keppra 500 mg twice daily   10. Chronic constipation: is stable will continue colace twice daily   11. Cerebrovascular accident (CVA) due to embolism of right middle cerebral artery: is s/p compression of right  frontotemporoparteital craniotomy with durplasty 2018: is stable will continue eliquis 5 mg twice daily   12. Vitamin D deficiency: is stable 38.55 will continue 50,000 units weekly   13. 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 GERD without esophagitis is stable will continue prilosec 40 mg daily   15. Multinodular goiter; is status post aspiration      Ok Edwards NP [facility]  call [phone]

## 2021-09-09 DIAGNOSIS — F22 Delusional disorders: Secondary | ICD-10-CM | POA: Insufficient documentation

## 2021-09-14 ENCOUNTER — Non-Acute Institutional Stay (SKILLED_NURSING_FACILITY): Payer: Medicaid Other | Admitting: Adult Health

## 2021-09-14 ENCOUNTER — Encounter: Payer: Self-pay | Admitting: Adult Health

## 2021-09-14 DIAGNOSIS — F22 Delusional disorders: Secondary | ICD-10-CM

## 2021-09-14 NOTE — Progress Notes (Signed)
Location:  [place] Room Number: 14 Place of Service:  SNF (31)   CODE STATUS: dnr   Allergies  Allergen Reactions   Cheese    Penicillins Hives    Has patient had a PCN reaction causing immediate rash, facial/tongue/throat swelling, SOB or lightheadedness with hypotension: Unknown Has patient had a PCN reaction causing severe rash involving mucus membranes or skin necrosis: Unknown Has patient had a PCN reaction that required hospitalization: No Has patient had a PCN reaction occurring within the last 10 years: No If all of the above answers are "NO", then may proceed with Cephalosporin use.     Chief Complaint  Patient presents with   Acute Visit    Agitation     HPI:  Staff report that he is becoming more agitated and paranoid. He acuses people to trying to steal his identity. He is cussing at people as they walk by him. The staff are concerned about his state of mind.   Past Medical History:  Diagnosis Date   DM (diabetes mellitus) ([place])    History of seizures    Hypertension    Stroke [facility])     Past Surgical History:  Procedure Laterality Date   CRANIECTOMY Right 04/01/2017   Procedure: RIGHT DECOMPRESSIVE CRANIECTOMY;  Surgeon: Ditty, Kevan Ny, MD;  Location: [place];  Service: Neurosurgery;  Laterality: Right;   ESOPHAGOGASTRODUODENOSCOPY N/A 04/13/2017   Procedure: ESOPHAGOGASTRODUODENOSCOPY (EGD);  Surgeon: Georganna Skeans, MD;  Location: [place];  Service: General;  Laterality: N/A;  bedside   INCISION AND DRAINAGE PERIRECTAL ABSCESS N/A 09/28/2017   Procedure: IRRIGATION AND DEBRIDEMENT PERIANAL ABSCESS;  Surgeon: Virl Cagey, MD;  Location: [facility];  Service: General;  Laterality: N/A;   PEG PLACEMENT N/A 04/13/2017   Procedure: PERCUTANEOUS ENDOSCOPIC GASTROSTOMY (PEG) PLACEMENT;  Surgeon: Georganna Skeans, MD;  Location: [facility] ENDOSCOPY;  Service: General;  Laterality: N/A;    Social History   Socioeconomic History    Marital status: Unknown    Spouse name: Not on file   Number of children: Not on file   Years of education: Not on file   Highest education level: Not on file  Occupational History   Not on file  Tobacco Use   Smoking status: Former    Types: Cigarettes   Smokeless tobacco: Never   Tobacco comments:    UTA  Vaping Use   Vaping Use: Never used  Substance and Sexual Activity   Alcohol use: No    Comment: UTA   Drug use: No    Comment: UTA   Sexual activity: Not Currently    Birth control/protection: None  Other Topics Concern   Not on file  Social History Narrative   Not on file   Social Determinants of Health   Financial Resource Strain: Not on file  Food Insecurity: Not on file  Transportation Needs: Not on file  Physical Activity: Not on file  Stress: Not on file  Social Connections: Not on file  Intimate Partner Violence: Not on file   Family History  Problem Relation Age of Onset   Hypertension Father       VITAL SIGNS BP 127/82    Pulse 82    Temp 98.6 F (37 C)    Ht 5\' 6"  (1.676 m)    Wt 175 lb (79.4 kg)    BMI 28.25 kg/m   Outpatient Encounter Medications as of 09/14/2021  Medication Sig   acetaminophen (TYLENOL) 325 MG tablet Take  650 mg by mouth every 4 (four) hours as needed. Not to exceed 3 grams in 24 hours   apixaban (ELIQUIS) 5 MG TABS tablet Take 5 mg by mouth 2 (two) times daily.    atorvastatin (LIPITOR) 40 MG tablet Take 40 mg by mouth every evening.   Baclofen 5 MG TABS Take 5 mg by mouth in the morning and at bedtime.   bisacodyl (DULCOLAX) 10 MG suppository Place 1 suppository (10 mg total) rectally daily as needed for moderate constipation.   Cholecalciferol 1.25 MG (50000 UT) capsule Take 50,000 Units by mouth once a week. For Low Vitamin D Level on Monday   Dextromethorphan-guaiFENesin (ROBITUSSIN COUGH+CHEST CONG DM PO) Take 10 mLs by mouth every 6 (six) hours as needed.   docusate (COLACE) 50 MG/5ML liquid Give 10 ml by mouth twice a  day   gabapentin (NEURONTIN) 100 MG capsule Take 100 mg by mouth at bedtime.   levETIRAcetam (KEPPRA) 100 MG/ML solution Take 500 mg by mouth 2 (two) times daily. For Seizures Mix in Grape Juice   LORazepam (ATIVAN) 2 MG/ML injection Inject 0.5 mLs (1 mg total) into the muscle every 15 (fifteen) minutes as needed. Up to 3 doses for uncontrolled seizure activity   Menthol, Topical Analgesic, (BIOFREEZE) 4 % GEL Apply 1 application topically 2 (two) times daily. Apply to left shoulder   metoprolol tartrate (LOPRESSOR) 25 MG tablet Take 12.5 mg by mouth daily.   NON FORMULARY Regular Diet   omeprazole (PRILOSEC) 40 MG capsule Take 40 mg by mouth daily.    sitaGLIPtin (JANUVIA) 50 MG tablet Take 50 mg by mouth daily.   No facility-administered encounter medications on file as of 09/14/2021.     SIGNIFICANT DIAGNOSTIC EXAMS  PREVIOUS   06-10-21: thyroid biopsy   NO NEW EXAMS.   LABS REVIEWED PREVIOUS:    09-10-20: glucose 163; bun 8; creat 0.60; k+ 3.7; na++ 138; ca 9.0 GFR>60; liver normal albumin 2.7; hgb a1c 7.5; chol 115; ldl 62; trig 108; hdl 31; tsh 1.233  01-28-21: wbc 11.9; hgb 10.8; hct 37.7; mcv 87.9 plt 208; glucose 99; bun 11; creat 0.63; k+ 3.7; na++ 139; ca 8.9; GFR >60; liver normal albumin 2.9; hgb a1c 5.1   05-03-21: hgb a1c 4.9; chol 117; ldl 64; trig 125; hdl 28; mag 2.0; vit D 38.55 micro albumin 92.8  TODAY  08-06-21: wbc 10.0; hgb 10.8 hct 37.8; mcv 76.8 plt 249; glucose 118; bun 11; creat 0.77; k+ 3.1; na++ 136; ca 8.5 GFR>60 08-13-21: wbc 11.9; hgb 10.3; hct 36.2; mcv 77.4 plt 251; glucose 69; bun 8; creat 0.69; k+ 3.2; na++ 140; ca 8.3 GFR >60  Review of Systems  Constitutional:  Negative for malaise/fatigue.  Respiratory:  Negative for cough and shortness of breath.   Cardiovascular:  Negative for chest pain, palpitations and leg swelling.  Gastrointestinal:  Negative for abdominal pain, constipation and heartburn.  Musculoskeletal:  Negative for back pain, joint  pain and myalgias.  Skin: Negative.   Neurological:  Negative for dizziness.  Psychiatric/Behavioral:  The patient is not nervous/anxious.     Physical Exam Constitutional:      General: He is not in acute distress.    Appearance: He is well-developed. He is not diaphoretic.  Neck:     Thyroid: Thyroid mass and thyromegaly present.  Cardiovascular:     Rate and Rhythm: Normal rate and regular rhythm.     Heart sounds: Normal heart sounds.  Pulmonary:     Effort: Pulmonary  effort is normal. No respiratory distress.     Breath sounds: Normal breath sounds.  Abdominal:     General: Bowel sounds are normal. There is no distension.     Palpations: Abdomen is soft.     Tenderness: There is no abdominal tenderness.  Musculoskeletal:     Cervical back: Neck supple.     Right lower leg: No edema.     Left lower leg: No edema.     Comments: Left hemiplegia   Lymphadenopathy:     Cervical: No cervical adenopathy.  Skin:    General: Skin is warm and dry.  Neurological:     Mental Status: He is alert. Mental status is at baseline.  Psychiatric:        Mood and Affect: Mood normal.     ASSESSMENT/ PLAN:  TODAY  Paranoia Delusions  Is worse will begin seroquel 25 mg nightly and monitor her status.   Ok Edwards NP [facility] Adult [facility]   call [phone]

## 2021-09-15 DIAGNOSIS — F22 Delusional disorders: Secondary | ICD-10-CM | POA: Insufficient documentation

## 2021-09-17 ENCOUNTER — Non-Acute Institutional Stay (SKILLED_NURSING_FACILITY): Payer: Medicaid Other | Admitting: Adult Health

## 2021-09-17 ENCOUNTER — Encounter: Payer: Self-pay | Admitting: Adult Health

## 2021-09-17 DIAGNOSIS — F22 Delusional disorders: Secondary | ICD-10-CM | POA: Diagnosis not present

## 2021-09-17 DIAGNOSIS — R569 Unspecified convulsions: Secondary | ICD-10-CM | POA: Diagnosis not present

## 2021-09-17 NOTE — Progress Notes (Signed)
Location:  [place] Room Number: 127-D Place of Service:  SNF (31)   CODE STATUS: DNR  Allergies  Allergen Reactions   Cheese    Penicillins Hives    Has patient had a PCN reaction causing immediate rash, facial/tongue/throat swelling, SOB or lightheadedness with hypotension: Unknown Has patient had a PCN reaction causing severe rash involving mucus membranes or skin necrosis: Unknown Has patient had a PCN reaction that required hospitalization: No Has patient had a PCN reaction occurring within the last 10 years: No If all of the above answers are "NO", then may proceed with Cephalosporin use.     Chief Complaint  Patient presents with   Acute Visit    Care plan meeting    HPI:  We have come together for his care plan meeting. BIMS 15/15 mood 0/30. He requires extensive to dependent for his adls. He is incontinent of bladder and bowel. He is nonambulatory has had not falls. Dietary: regular diet good appetite has gained 2 pounds in the past quarter. Therapy:pt for strengthening.  He has been started on seroquel for his hallucinations and delusions. He continues to be followed for his chronic illnesses including: Delusions  Seizures  Paranoia   Past Medical History:  Diagnosis Date   DM (diabetes mellitus) ([place])    History of seizures    Hypertension    Stroke [facility])     Past Surgical History:  Procedure Laterality Date   CRANIECTOMY Right 04/01/2017   Procedure: RIGHT DECOMPRESSIVE CRANIECTOMY;  Surgeon: Ditty, Kevan Ny, MD;  Location: [place];  Service: Neurosurgery;  Laterality: Right;   ESOPHAGOGASTRODUODENOSCOPY N/A 04/13/2017   Procedure: ESOPHAGOGASTRODUODENOSCOPY (EGD);  Surgeon: Georganna Skeans, MD;  Location: [place];  Service: General;  Laterality: N/A;  bedside   INCISION AND DRAINAGE PERIRECTAL ABSCESS N/A 09/28/2017   Procedure: IRRIGATION AND DEBRIDEMENT PERIANAL ABSCESS;  Surgeon: Virl Cagey, MD;  Location: [facility];   Service: General;  Laterality: N/A;   PEG PLACEMENT N/A 04/13/2017   Procedure: PERCUTANEOUS ENDOSCOPIC GASTROSTOMY (PEG) PLACEMENT;  Surgeon: Georganna Skeans, MD;  Location: [facility] ENDOSCOPY;  Service: General;  Laterality: N/A;    Social History   Socioeconomic History   Marital status: Unknown    Spouse name: Not on file   Number of children: Not on file   Years of education: Not on file   Highest education level: Not on file  Occupational History   Not on file  Tobacco Use   Smoking status: Former    Types: Cigarettes   Smokeless tobacco: Never   Tobacco comments:    UTA  Vaping Use   Vaping Use: Never used  Substance and Sexual Activity   Alcohol use: No    Comment: UTA   Drug use: No    Comment: UTA   Sexual activity: Not Currently    Birth control/protection: None  Other Topics Concern   Not on file  Social History Narrative   Not on file   Social Determinants of Health   Financial Resource Strain: Not on file  Food Insecurity: Not on file  Transportation Needs: Not on file  Physical Activity: Not on file  Stress: Not on file  Social Connections: Not on file  Intimate Partner Violence: Not on file   Family History  Problem Relation Age of Onset   Hypertension Father       VITAL SIGNS BP 127/68    Pulse 82    Temp 97.7 F (36.5 C)  Resp 20    Ht 5\' 6"  (1.676 m)    Wt 175 lb (79.4 kg)    SpO2 97%    BMI 28.25 kg/m   Outpatient Encounter Medications as of 09/17/2021  Medication Sig   acetaminophen (TYLENOL) 325 MG tablet Take 650 mg by mouth every 4 (four) hours as needed. Not to exceed 3 grams in 24 hours   apixaban (ELIQUIS) 5 MG TABS tablet Take 5 mg by mouth 2 (two) times daily.    atorvastatin (LIPITOR) 40 MG tablet Take 40 mg by mouth every evening.   bisacodyl (DULCOLAX) 10 MG suppository Place 1 suppository (10 mg total) rectally daily as needed for moderate constipation.   Cholecalciferol 1.25 MG (50000 UT) capsule Take 50,000 Units by mouth once  a week. For Low Vitamin D Level on Monday   Dextromethorphan-guaiFENesin (ROBITUSSIN COUGH+CHEST CONG DM PO) Take 10 mLs by mouth every 6 (six) hours as needed.   docusate (COLACE) 50 MG/5ML liquid Give 10 ml by mouth twice a day   gabapentin (NEURONTIN) 100 MG capsule Take 100 mg by mouth at bedtime.   levETIRAcetam (KEPPRA) 100 MG/ML solution Take 500 mg by mouth 2 (two) times daily. For Seizures Mix in Grape Juice   LORazepam (ATIVAN) 2 MG/ML injection Inject 0.5 mLs (1 mg total) into the muscle every 15 (fifteen) minutes as needed. Up to 3 doses for uncontrolled seizure activity   Menthol, Topical Analgesic, (BIOFREEZE) 4 % GEL Apply 1 application topically 2 (two) times daily. Apply to left shoulder   metoprolol tartrate (LOPRESSOR) 25 MG tablet Take 12.5 mg by mouth daily.   NON FORMULARY Regular Diet   omeprazole (PRILOSEC) 40 MG capsule Take 40 mg by mouth daily.    QUEtiapine (SEROQUEL) 25 MG tablet Take 25 mg by mouth at bedtime.   Baclofen 5 MG TABS Take 5 mg by mouth in the morning and at bedtime.   [DISCONTINUED] sitaGLIPtin (JANUVIA) 50 MG tablet Take 50 mg by mouth daily.   No facility-administered encounter medications on file as of 09/17/2021.     SIGNIFICANT DIAGNOSTIC EXAMS  PREVIOUS   06-10-21: thyroid biopsy   NO NEW EXAMS.   LABS REVIEWED PREVIOUS:    09-10-20: glucose 163; bun 8; creat 0.60; k+ 3.7; na++ 138; ca 9.0 GFR>60; liver normal albumin 2.7; hgb a1c 7.5; chol 115; ldl 62; trig 108; hdl 31; tsh 1.233  01-28-21: wbc 11.9; hgb 10.8; hct 37.7; mcv 87.9 plt 208; glucose 99; bun 11; creat 0.63; k+ 3.7; na++ 139; ca 8.9; GFR >60; liver normal albumin 2.9; hgb a1c 5.1   05-03-21: hgb a1c 4.9; chol 117; ldl 64; trig 125; hdl 28; mag 2.0; vit D 38.55 micro albumin 92.8 08-06-21: wbc 10.0; hgb 10.8 hct 37.8; mcv 76.8 plt 249; glucose 118; bun 11; creat 0.77; k+ 3.1; na++ 136; ca 8.5 GFR>60 08-13-21: wbc 11.9; hgb 10.3; hct 36.2; mcv 77.4 plt 251; glucose 69; bun 8; creat  0.69; k+ 3.2; na++ 140; ca 8.3 GFR >60  NO NEW LABS   Review of Systems  Constitutional:  Negative for malaise/fatigue.  Respiratory:  Negative for cough and shortness of breath.   Cardiovascular:  Negative for chest pain, palpitations and leg swelling.  Gastrointestinal:  Negative for abdominal pain, constipation and heartburn.  Musculoskeletal:  Negative for back pain, joint pain and myalgias.  Skin: Negative.   Neurological:  Negative for dizziness.  Psychiatric/Behavioral:  The patient is nervous/anxious.    Physical Exam Constitutional:  General: He is not in acute distress.    Appearance: He is well-developed. He is not diaphoretic.  Neck:     Thyroid: Thyroid mass and thyromegaly present.  Cardiovascular:     Rate and Rhythm: Normal rate and regular rhythm.     Pulses: Normal pulses.     Heart sounds: Normal heart sounds.  Pulmonary:     Effort: Pulmonary effort is normal. No respiratory distress.     Breath sounds: Normal breath sounds.  Abdominal:     General: Bowel sounds are normal. There is no distension.     Palpations: Abdomen is soft.     Tenderness: There is no abdominal tenderness.  Musculoskeletal:     Cervical back: Neck supple.     Right lower leg: No edema.     Left lower leg: No edema.     Comments: Left hemiplegia   Lymphadenopathy:     Cervical: No cervical adenopathy.  Skin:    General: Skin is warm and dry.  Neurological:     Mental Status: He is alert. Mental status is at baseline.  Psychiatric:        Mood and Affect: Mood normal.      ASSESSMENT/ PLAN:  TODAY  Delusions Seizures Paranoia   Will continue current medications Will continue current plan of care Will continue to monitor her status.   Time spent with patient: 40 minutes: medications behaviors; plan of care.     Ok Edwards NP [facility] Adult Medicine  call [phone]

## 2021-09-21 ENCOUNTER — Encounter: Payer: Self-pay | Admitting: Adult Health

## 2021-09-21 ENCOUNTER — Non-Acute Institutional Stay (SKILLED_NURSING_FACILITY): Payer: Medicaid Other | Admitting: Adult Health

## 2021-09-21 DIAGNOSIS — F22 Delusional disorders: Secondary | ICD-10-CM | POA: Diagnosis not present

## 2021-09-21 NOTE — Progress Notes (Signed)
Location:  [place] Room Number: 28 Place of Service:  SNF (31)   CODE STATUS: dnr   Allergies  Allergen Reactions   Cheese    Penicillins Hives    Has patient had a PCN reaction causing immediate rash, facial/tongue/throat swelling, SOB or lightheadedness with hypotension: Unknown Has patient had a PCN reaction causing severe rash involving mucus membranes or skin necrosis: Unknown Has patient had a PCN reaction that required hospitalization: No Has patient had a PCN reaction occurring within the last 10 years: No If all of the above answers are "NO", then may proceed with Cephalosporin use.     Chief Complaint  Patient presents with   Acute Visit    Concerns about behaviors     HPI:  He has been placed on seroquel 25 mg nightly for his hallucinations and delusions. He continues to have false thought that cma's are texting him without a phone. He is cussing staff and yelling out for no reason. He denies any insomnia. Does feel anxious with his delusional thoughts.   Past Medical History:  Diagnosis Date   DM (diabetes mellitus) ([place])    History of seizures    Hypertension    Stroke [facility])     Past Surgical History:  Procedure Laterality Date   CRANIECTOMY Right 04/01/2017   Procedure: RIGHT DECOMPRESSIVE CRANIECTOMY;  Surgeon: Ditty, Kevan Ny, MD;  Location: [place];  Service: Neurosurgery;  Laterality: Right;   ESOPHAGOGASTRODUODENOSCOPY N/A 04/13/2017   Procedure: ESOPHAGOGASTRODUODENOSCOPY (EGD);  Surgeon: Georganna Skeans, MD;  Location: [place];  Service: General;  Laterality: N/A;  bedside   INCISION AND DRAINAGE PERIRECTAL ABSCESS N/A 09/28/2017   Procedure: IRRIGATION AND DEBRIDEMENT PERIANAL ABSCESS;  Surgeon: Virl Cagey, MD;  Location: [facility];  Service: General;  Laterality: N/A;   PEG PLACEMENT N/A 04/13/2017   Procedure: PERCUTANEOUS ENDOSCOPIC GASTROSTOMY (PEG) PLACEMENT;  Surgeon: Georganna Skeans, MD;  Location: [facility]  ENDOSCOPY;  Service: General;  Laterality: N/A;    Social History   Socioeconomic History   Marital status: Unknown    Spouse name: Not on file   Number of children: Not on file   Years of education: Not on file   Highest education level: Not on file  Occupational History   Not on file  Tobacco Use   Smoking status: Former    Types: Cigarettes   Smokeless tobacco: Never   Tobacco comments:    UTA  Vaping Use   Vaping Use: Never used  Substance and Sexual Activity   Alcohol use: No    Comment: UTA   Drug use: No    Comment: UTA   Sexual activity: Not Currently    Birth control/protection: None  Other Topics Concern   Not on file  Social History Narrative   Not on file   Social Determinants of Health   Financial Resource Strain: Not on file  Food Insecurity: Not on file  Transportation Needs: Not on file  Physical Activity: Not on file  Stress: Not on file  Social Connections: Not on file  Intimate Partner Violence: Not on file   Family History  Problem Relation Age of Onset   Hypertension Father       VITAL SIGNS BP 133/73    Pulse 60    Temp 97.8 F (36.6 C)    Ht 5\' 6"  (1.676 m)    Wt 175 lb (79.4 kg)    BMI 28.25 kg/m   Outpatient Encounter Medications as  of 09/21/2021  Medication Sig   acetaminophen (TYLENOL) 325 MG tablet Take 650 mg by mouth every 4 (four) hours as needed. Not to exceed 3 grams in 24 hours   apixaban (ELIQUIS) 5 MG TABS tablet Take 5 mg by mouth 2 (two) times daily.    atorvastatin (LIPITOR) 40 MG tablet Take 40 mg by mouth every evening.   Baclofen 5 MG TABS Take 5 mg by mouth in the morning and at bedtime.   bisacodyl (DULCOLAX) 10 MG suppository Place 1 suppository (10 mg total) rectally daily as needed for moderate constipation.   Cholecalciferol 1.25 MG (50000 UT) capsule Take 50,000 Units by mouth once a week. For Low Vitamin D Level on Monday   Dextromethorphan-guaiFENesin (ROBITUSSIN COUGH+CHEST CONG DM PO) Take 10 mLs by  mouth every 6 (six) hours as needed.   docusate (COLACE) 50 MG/5ML liquid Give 10 ml by mouth twice a day   gabapentin (NEURONTIN) 100 MG capsule Take 100 mg by mouth at bedtime.   levETIRAcetam (KEPPRA) 100 MG/ML solution Take 500 mg by mouth 2 (two) times daily. For Seizures Mix in Grape Juice   LORazepam (ATIVAN) 2 MG/ML injection Inject 0.5 mLs (1 mg total) into the muscle every 15 (fifteen) minutes as needed. Up to 3 doses for uncontrolled seizure activity   Menthol, Topical Analgesic, (BIOFREEZE) 4 % GEL Apply 1 application topically 2 (two) times daily. Apply to left shoulder   metoprolol tartrate (LOPRESSOR) 25 MG tablet Take 12.5 mg by mouth daily.   NON FORMULARY Regular Diet   omeprazole (PRILOSEC) 40 MG capsule Take 40 mg by mouth daily.    QUEtiapine (SEROQUEL) 25 MG tablet Take 25 mg by mouth at bedtime.   No facility-administered encounter medications on file as of 09/21/2021.     SIGNIFICANT DIAGNOSTIC EXAMS   PREVIOUS   06-10-21: thyroid biopsy   NO NEW EXAMS.   LABS REVIEWED PREVIOUS:    09-10-20: glucose 163; bun 8; creat 0.60; k+ 3.7; na++ 138; ca 9.0 GFR>60; liver normal albumin 2.7; hgb a1c 7.5; chol 115; ldl 62; trig 108; hdl 31; tsh 1.233  01-28-21: wbc 11.9; hgb 10.8; hct 37.7; mcv 87.9 plt 208; glucose 99; bun 11; creat 0.63; k+ 3.7; na++ 139; ca 8.9; GFR >60; liver normal albumin 2.9; hgb a1c 5.1   05-03-21: hgb a1c 4.9; chol 117; ldl 64; trig 125; hdl 28; mag 2.0; vit D 38.55 micro albumin 92.8 08-06-21: wbc 10.0; hgb 10.8 hct 37.8; mcv 76.8 plt 249; glucose 118; bun 11; creat 0.77; k+ 3.1; na++ 136; ca 8.5 GFR>60 08-13-21: wbc 11.9; hgb 10.3; hct 36.2; mcv 77.4 plt 251; glucose 69; bun 8; creat 0.69; k+ 3.2; na++ 140; ca 8.3 GFR >60  NO NEW LABS   Review of Systems  Constitutional:  Negative for malaise/fatigue.  Respiratory:  Negative for cough and shortness of breath.   Cardiovascular:  Negative for chest pain, palpitations and leg swelling.   Gastrointestinal:  Negative for abdominal pain, constipation and heartburn.  Musculoskeletal:  Negative for back pain, joint pain and myalgias.  Skin: Negative.   Neurological:  Negative for dizziness.  Psychiatric/Behavioral:  Positive for hallucinations. The patient is nervous/anxious.    Physical Exam Constitutional:      General: He is not in acute distress.    Appearance: He is well-developed. He is not diaphoretic.  Neck:     Thyroid: Thyroid mass and thyromegaly present.  Cardiovascular:     Rate and Rhythm: Normal rate and regular  rhythm.     Pulses: Normal pulses.     Heart sounds: Normal heart sounds.  Pulmonary:     Effort: Pulmonary effort is normal. No respiratory distress.     Breath sounds: Normal breath sounds.  Abdominal:     General: Bowel sounds are normal. There is no distension.     Palpations: Abdomen is soft.     Tenderness: There is no abdominal tenderness.  Musculoskeletal:     Cervical back: Neck supple.     Right lower leg: No edema.     Left lower leg: No edema.     Comments: Left hemiplegia   Lymphadenopathy:     Cervical: No cervical adenopathy.  Skin:    General: Skin is warm and dry.  Neurological:     Mental Status: He is alert. Mental status is at baseline.  Psychiatric:        Mood and Affect: Mood normal.     ASSESSMENT/ PLAN:  TODAY  Paranoia Delusion  Will increase his seroquel to 25 mg twice daily and will monitor his response.    Ok Edwards NP [facility] Adult Medicine   [phone]

## 2021-10-01 ENCOUNTER — Non-Acute Institutional Stay (SKILLED_NURSING_FACILITY): Payer: Medicaid Other | Admitting: Adult Health

## 2021-10-01 ENCOUNTER — Encounter: Payer: Self-pay | Admitting: Adult Health

## 2021-10-01 DIAGNOSIS — R569 Unspecified convulsions: Secondary | ICD-10-CM | POA: Diagnosis not present

## 2021-10-01 DIAGNOSIS — I69354 Hemiplegia and hemiparesis following cerebral infarction affecting left non-dominant side: Secondary | ICD-10-CM

## 2021-10-01 DIAGNOSIS — I152 Hypertension secondary to endocrine disorders: Secondary | ICD-10-CM | POA: Diagnosis not present

## 2021-10-01 DIAGNOSIS — E1159 Type 2 diabetes mellitus with other circulatory complications: Secondary | ICD-10-CM

## 2021-10-01 NOTE — Progress Notes (Signed)
Location:  [place] Room Number: 113-D Place of Service:  SNF (31)   CODE STATUS: DNR  Allergies  Allergen Reactions   Cheese    Penicillins Hives    Has patient had a PCN reaction causing immediate rash, facial/tongue/throat swelling, SOB or lightheadedness with hypotension: Unknown Has patient had a PCN reaction causing severe rash involving mucus membranes or skin necrosis: Unknown Has patient had a PCN reaction that required hospitalization: No Has patient had a PCN reaction occurring within the last 10 years: No If all of the above answers are "NO", then may proceed with Cephalosporin use.     Chief Complaint  Patient presents with   Acute Visit    Care plan meeting    HPI:  We have come together for his care plan meeting. BIMS 11/15 mood 4/30: decreased appetite; anxious. He has been delusional cussing at staff. He requires extensive assist to dependence for her adl care. He is incontinent of bladder and bowel. Is nonambulatory no recent falls. Dietary: regular diet; good appetite; weight is 175 pounds is back to baseline weight. Therapy: is out of bed daily for 6-8 hours is independent to propel wheelchair; transfer with one assist. . He continues to be followed for his chronic illnesses including: Hemiparesis affecting left side as late effect of cerebrovascular accident (CVA)  Seizure Hypertension associated with type 2 diabetes mellitus  Past Medical History:  Diagnosis Date   DM (diabetes mellitus) ([place])    History of seizures    Hypertension    Stroke [facility])     Past Surgical History:  Procedure Laterality Date   CRANIECTOMY Right 04/01/2017   Procedure: RIGHT DECOMPRESSIVE CRANIECTOMY;  Surgeon: Ditty, Kevan Ny, MD;  Location: [place];  Service: Neurosurgery;  Laterality: Right;   ESOPHAGOGASTRODUODENOSCOPY N/A 04/13/2017   Procedure: ESOPHAGOGASTRODUODENOSCOPY (EGD);  Surgeon: Georganna Skeans, MD;  Location: [place];  Service:  General;  Laterality: N/A;  bedside   INCISION AND DRAINAGE PERIRECTAL ABSCESS N/A 09/28/2017   Procedure: IRRIGATION AND DEBRIDEMENT PERIANAL ABSCESS;  Surgeon: Virl Cagey, MD;  Location: [facility];  Service: General;  Laterality: N/A;   PEG PLACEMENT N/A 04/13/2017   Procedure: PERCUTANEOUS ENDOSCOPIC GASTROSTOMY (PEG) PLACEMENT;  Surgeon: Georganna Skeans, MD;  Location: [facility] ENDOSCOPY;  Service: General;  Laterality: N/A;    Social History   Socioeconomic History   Marital status: Unknown    Spouse name: Not on file   Number of children: Not on file   Years of education: Not on file   Highest education level: Not on file  Occupational History   Not on file  Tobacco Use   Smoking status: Former    Types: Cigarettes   Smokeless tobacco: Never   Tobacco comments:    UTA  Vaping Use   Vaping Use: Never used  Substance and Sexual Activity   Alcohol use: No    Comment: UTA   Drug use: No    Comment: UTA   Sexual activity: Not Currently    Birth control/protection: None  Other Topics Concern   Not on file  Social History Narrative   Not on file   Social Determinants of Health   Financial Resource Strain: Not on file  Food Insecurity: Not on file  Transportation Needs: Not on file  Physical Activity: Not on file  Stress: Not on file  Social Connections: Not on file  Intimate Partner Violence: Not on file   Family History  Problem Relation Age of Onset  Hypertension Father       VITAL SIGNS BP 104/61    Pulse 74    Temp 97.6 F (36.4 C)    Resp 20    Ht 5\' 6"  (1.676 m)    Wt 175 lb (79.4 kg)    SpO2 97%    BMI 28.25 kg/m   Outpatient Encounter Medications as of 10/01/2021  Medication Sig   acetaminophen (TYLENOL) 325 MG tablet Take 650 mg by mouth every 4 (four) hours as needed. Not to exceed 3 grams in 24 hours   apixaban (ELIQUIS) 5 MG TABS tablet Take 5 mg by mouth 2 (two) times daily.    atorvastatin (LIPITOR) 40 MG tablet Take 40 mg by mouth every  evening.   bisacodyl (DULCOLAX) 10 MG suppository Place 1 suppository (10 mg total) rectally daily as needed for moderate constipation.   Cholecalciferol 1.25 MG (50000 UT) capsule Take 50,000 Units by mouth once a week. For Low Vitamin D Level on Monday   Dextromethorphan-guaiFENesin (ROBITUSSIN COUGH+CHEST CONG DM PO) Take 10 mLs by mouth every 6 (six) hours as needed.   docusate (COLACE) 50 MG/5ML liquid Give 10 ml by mouth twice a day   gabapentin (NEURONTIN) 100 MG capsule Take 100 mg by mouth at bedtime.   levETIRAcetam (KEPPRA) 100 MG/ML solution Take 500 mg by mouth 2 (two) times daily. For Seizures Mix in Grape Juice   LORazepam (ATIVAN) 2 MG/ML injection Inject 0.5 mLs (1 mg total) into the muscle every 15 (fifteen) minutes as needed. Up to 3 doses for uncontrolled seizure activity   Menthol, Topical Analgesic, (BIOFREEZE) 4 % GEL Apply 1 application topically 2 (two) times daily. Apply to left shoulder   metoprolol tartrate (LOPRESSOR) 25 MG tablet Take 12.5 mg by mouth daily.   NON FORMULARY Regular Diet   omeprazole (PRILOSEC) 40 MG capsule Take 40 mg by mouth daily.    QUEtiapine (SEROQUEL) 25 MG tablet Take 25 mg by mouth 2 (two) times daily.   [DISCONTINUED] Baclofen 5 MG TABS Take 5 mg by mouth in the morning and at bedtime.   No facility-administered encounter medications on file as of 10/01/2021.     SIGNIFICANT DIAGNOSTIC EXAMS   PREVIOUS   06-10-21: thyroid biopsy   NO NEW EXAMS.   LABS REVIEWED PREVIOUS:    09-10-20: glucose 163; bun 8; creat 0.60; k+ 3.7; na++ 138; ca 9.0 GFR>60; liver normal albumin 2.7; hgb a1c 7.5; chol 115; ldl 62; trig 108; hdl 31; tsh 1.233  01-28-21: wbc 11.9; hgb 10.8; hct 37.7; mcv 87.9 plt 208; glucose 99; bun 11; creat 0.63; k+ 3.7; na++ 139; ca 8.9; GFR >60; liver normal albumin 2.9; hgb a1c 5.1   05-03-21: hgb a1c 4.9; chol 117; ldl 64; trig 125; hdl 28; mag 2.0; vit D 38.55 micro albumin 92.8 08-06-21: wbc 10.0; hgb 10.8 hct 37.8; mcv  76.8 plt 249; glucose 118; bun 11; creat 0.77; k+ 3.1; na++ 136; ca 8.5 GFR>60 08-13-21: wbc 11.9; hgb 10.3; hct 36.2; mcv 77.4 plt 251; glucose 69; bun 8; creat 0.69; k+ 3.2; na++ 140; ca 8.3 GFR >60  NO NEW LABS   Review of Systems  Constitutional:  Negative for malaise/fatigue.  Respiratory:  Negative for cough and shortness of breath.   Cardiovascular:  Negative for chest pain, palpitations and leg swelling.  Gastrointestinal:  Negative for abdominal pain, constipation and heartburn.  Musculoskeletal:  Negative for back pain, joint pain and myalgias.  Skin: Negative.   Neurological:  Negative  for dizziness.  Psychiatric/Behavioral:  The patient is not nervous/anxious.    Physical Exam Constitutional:      General: He is not in acute distress.    Appearance: He is well-developed. He is not diaphoretic.  Neck:     Thyroid: No thyromegaly.  Cardiovascular:     Rate and Rhythm: Normal rate and regular rhythm.     Pulses: Normal pulses.     Heart sounds: Normal heart sounds.  Pulmonary:     Effort: Pulmonary effort is normal. No respiratory distress.     Breath sounds: Normal breath sounds.  Abdominal:     General: Bowel sounds are normal. There is no distension.     Palpations: Abdomen is soft.     Tenderness: There is no abdominal tenderness.  Musculoskeletal:     Cervical back: Neck supple.     Right lower leg: No edema.     Left lower leg: No edema.     Comments: Left hemiplegia  Lymphadenopathy:     Cervical: No cervical adenopathy.  Skin:    General: Skin is warm and dry.  Neurological:     Mental Status: He is alert. Mental status is at baseline.  Psychiatric:        Mood and Affect: Mood normal.     ASSESSMENT/ PLAN:  TODAY  Hemiparesis affecting left side as late effect of cerebrovascular accident (CVA) Seizure Hypertension associated with type 2 diabetes mellitus  Will continue current medications Will continue to monitor his status.  Will continue  current plan of care   Time spent with patient: 40 minutes: care plan; therapy and medications.    Ok Edwards NP [facility]  call [phone]

## 2021-10-04 ENCOUNTER — Non-Acute Institutional Stay (SKILLED_NURSING_FACILITY): Payer: Medicaid Other | Admitting: Adult Health

## 2021-10-04 ENCOUNTER — Encounter: Payer: Self-pay | Admitting: Adult Health

## 2021-10-04 DIAGNOSIS — I480 Paroxysmal atrial fibrillation: Secondary | ICD-10-CM

## 2021-10-04 DIAGNOSIS — F22 Delusional disorders: Secondary | ICD-10-CM | POA: Diagnosis not present

## 2021-10-04 DIAGNOSIS — D509 Iron deficiency anemia, unspecified: Secondary | ICD-10-CM

## 2021-10-04 DIAGNOSIS — E43 Unspecified severe protein-calorie malnutrition: Secondary | ICD-10-CM | POA: Diagnosis not present

## 2021-10-04 NOTE — Progress Notes (Signed)
Location:  [place] Room Number: 113 Place of Service:  SNF (31) Provider:  Ok Edwards, NP   CODE STATUS: DNR  Allergies  Allergen Reactions   Cheese    Penicillins Hives    Has patient had a PCN reaction causing immediate rash, facial/tongue/throat swelling, SOB or lightheadedness with hypotension: Unknown Has patient had a PCN reaction causing severe rash involving mucus membranes or skin necrosis: Unknown Has patient had a PCN reaction that required hospitalization: No Has patient had a PCN reaction occurring within the last 10 years: No If all of the above answers are "NO", then may proceed with Cephalosporin use.     Chief Complaint  Patient presents with   Medical Management of Chronic Issues               Delusions/paranoia : AF (paroxsymal atrial fibrillation)  Hypochromic anemia:  Protein calorie malnutrition, severe    HPI:  Joe Wilkinson is a 61 year old long term resident of this facility being seen for the management of her chronic illnesses: Delusions/paranoia : AF (paroxsymal atrial fibrillation)  Hypochromic anemia:  Protein calorie malnutrition, severe. There are no reports of uncontrolled pain. There are no reports of hallucinations or delusions.   Past Medical History:  Diagnosis Date   DM (diabetes mellitus) ([place])    History of seizures    Hypertension    Stroke [facility])     Past Surgical History:  Procedure Laterality Date   CRANIECTOMY Right 04/01/2017   Procedure: RIGHT DECOMPRESSIVE CRANIECTOMY;  Surgeon: Ditty, Kevan Ny, MD;  Location: [place];  Service: Neurosurgery;  Laterality: Right;   ESOPHAGOGASTRODUODENOSCOPY N/A 04/13/2017   Procedure: ESOPHAGOGASTRODUODENOSCOPY (EGD);  Surgeon: Georganna Skeans, MD;  Location: [place];  Service: General;  Laterality: N/A;  bedside   INCISION AND DRAINAGE PERIRECTAL ABSCESS N/A 09/28/2017   Procedure: IRRIGATION AND DEBRIDEMENT PERIANAL ABSCESS;  Surgeon: Virl Cagey, MD;   Location: [facility];  Service: General;  Laterality: N/A;   PEG PLACEMENT N/A 04/13/2017   Procedure: PERCUTANEOUS ENDOSCOPIC GASTROSTOMY (PEG) PLACEMENT;  Surgeon: Georganna Skeans, MD;  Location: [facility] ENDOSCOPY;  Service: General;  Laterality: N/A;    Social History   Socioeconomic History   Marital status: Unknown    Spouse name: Not on file   Number of children: Not on file   Years of education: Not on file   Highest education level: Not on file  Occupational History   Not on file  Tobacco Use   Smoking status: Former    Types: Cigarettes   Smokeless tobacco: Never   Tobacco comments:    UTA  Vaping Use   Vaping Use: Never used  Substance and Sexual Activity   Alcohol use: No    Comment: UTA   Drug use: No    Comment: UTA   Sexual activity: Not Currently    Birth control/protection: None  Other Topics Concern   Not on file  Social History Narrative   Not on file   Social Determinants of Health   Financial Resource Strain: Not on file  Food Insecurity: Not on file  Transportation Needs: Not on file  Physical Activity: Not on file  Stress: Not on file  Social Connections: Not on file  Intimate Partner Violence: Not on file   Family History  Problem Relation Age of Onset   Hypertension Father       VITAL SIGNS BP 114/66    Pulse 98    Temp (!) 97.4 F (36.3  C)    Ht 5\' 6"  (1.676 m)    Wt 175 lb (79.4 kg)    BMI 28.25 kg/m   Outpatient Encounter Medications as of 10/04/2021  Medication Sig   acetaminophen (TYLENOL) 325 MG tablet Take 650 mg by mouth every 4 (four) hours as needed. Not to exceed 3 grams in 24 hours   apixaban (ELIQUIS) 5 MG TABS tablet Take 5 mg by mouth 2 (two) times daily.    atorvastatin (LIPITOR) 40 MG tablet Take 40 mg by mouth every evening.   bisacodyl (DULCOLAX) 10 MG suppository Place 1 suppository (10 mg total) rectally daily as needed for moderate constipation.   Cholecalciferol 1.25 MG (50000 UT) capsule Take 50,000 Units by mouth once  a week. For Low Vitamin D Level on Monday   Dextromethorphan-guaiFENesin (ROBITUSSIN COUGH+CHEST CONG DM PO) Take 10 mLs by mouth every 6 (six) hours as needed.   docusate (COLACE) 50 MG/5ML liquid Give 10 ml by mouth twice a day   gabapentin (NEURONTIN) 100 MG capsule Take 100 mg by mouth at bedtime.   levETIRAcetam (KEPPRA) 100 MG/ML solution Take 500 mg by mouth 2 (two) times daily. For Seizures Mix in Grape Juice   LORazepam (ATIVAN) 2 MG/ML injection Inject 0.5 mLs (1 mg total) into the muscle every 15 (fifteen) minutes as needed. Up to 3 doses for uncontrolled seizure activity   Menthol, Topical Analgesic, (BIOFREEZE) 4 % GEL Apply 1 application topically 2 (two) times daily. Apply to left shoulder   metoprolol tartrate (LOPRESSOR) 25 MG tablet Take 12.5 mg by mouth daily.   NON FORMULARY Regular Diet   omeprazole (PRILOSEC) 40 MG capsule Take 40 mg by mouth daily.    QUEtiapine (SEROQUEL) 25 MG tablet Take 25 mg by mouth 2 (two) times daily.   No facility-administered encounter medications on file as of 10/04/2021.     SIGNIFICANT DIAGNOSTIC EXAMS  PREVIOUS   06-10-21: thyroid biopsy   NO NEW EXAMS.   LABS REVIEWED PREVIOUS:    09-10-20: glucose 163; bun 8; creat 0.60; k+ 3.7; na++ 138; ca 9.0 GFR>60; liver normal albumin 2.7; hgb a1c 7.5; chol 115; ldl 62; trig 108; hdl 31; tsh 1.233  01-28-21: wbc 11.9; hgb 10.8; hct 37.7; mcv 87.9 plt 208; glucose 99; bun 11; creat 0.63; k+ 3.7; na++ 139; ca 8.9; GFR >60; liver normal albumin 2.9; hgb a1c 5.1   05-03-21: hgb a1c 4.9; chol 117; ldl 64; trig 125; hdl 28; mag 2.0; vit D 38.55 micro albumin 92.8 08-06-21: wbc 10.0; hgb 10.8 hct 37.8; mcv 76.8 plt 249; glucose 118; bun 11; creat 0.77; k+ 3.1; na++ 136; ca 8.5 GFR>60 08-13-21: wbc 11.9; hgb 10.3; hct 36.2; mcv 77.4 plt 251; glucose 69; bun 8; creat 0.69; k+ 3.2; na++ 140; ca 8.3 GFR >60  NO NEW LABS   Review of Systems  Constitutional:  Negative for malaise/fatigue.  Respiratory:   Negative for cough and shortness of breath.   Cardiovascular:  Negative for chest pain, palpitations and leg swelling.  Gastrointestinal:  Negative for abdominal pain, constipation and heartburn.  Musculoskeletal:  Negative for back pain, joint pain and myalgias.  Skin: Negative.   Neurological:  Negative for dizziness.  Psychiatric/Behavioral:  The patient is not nervous/anxious.    Physical Exam Constitutional:      General: Joe Wilkinson is not in acute distress.    Appearance: Joe Wilkinson is well-developed. Joe Wilkinson is not diaphoretic.  Neck:     Thyroid: Thyroid mass and thyromegaly present.  Cardiovascular:  Rate and Rhythm: Normal rate and regular rhythm.     Pulses: Normal pulses.     Heart sounds: Normal heart sounds.  Pulmonary:     Effort: Pulmonary effort is normal. No respiratory distress.     Breath sounds: Normal breath sounds.  Abdominal:     General: Bowel sounds are normal. There is no distension.     Palpations: Abdomen is soft.     Tenderness: There is no abdominal tenderness.  Musculoskeletal:     Cervical back: Neck supple.     Right lower leg: No edema.     Left lower leg: No edema.     Comments: Left hemiplegia   Lymphadenopathy:     Cervical: No cervical adenopathy.  Skin:    General: Skin is warm and dry.  Neurological:     Mental Status: Joe Wilkinson is alert. Mental status is at baseline.  Psychiatric:        Mood and Affect: Mood normal.     ASSESSMENT/ PLAN:  TODAY;   Delusions/paranoia  is presently taking seroquel 25 mg twice daily will not make changes; Joe Wilkinson is presently not reporting any delusional thoughts. Will monitor   2. AF (paroxsymal atrial fibrillation) heart rate is stable will continue lopressor 12.5 mg twice daily for rate control and eliquis 5 mg twice daily  3. Hypochromic anemia: is stable hgb 11.9; will monitor   4. Protein calorie malnutrition, severe: is stable albumin 2.9 will continue supplements as directed    PREVIOUS   5. Diabetic peripheral  neuropathy: is stable will continue gabapentin 100 mg nightly   6. Seizure: is stable without recent activity; will continue keppra 500 mg twice daily   7. Chronic constipation: is stable will continue colace twice daily   8. Cerebrovascular accident (CVA) due to embolism of right middle cerebral artery: is s/p compression of right  frontotemporoparteital craniotomy with durplasty 2018: is stable will continue eliquis 5 mg twice daily   9. Vitamin D deficiency: is stable 38.55 will continue 50,000 units weekly   10. Hemiplegia affecting left side as late effect of cerebrovascular accident (CVA) is stable will continue baclofen 5 mg three times daily for spasticity   11. GERD without esophagitis is stable will continue prilosec 40 mg daily   12. Multinodular goiter; is status post aspiration   13. Type 2 diabetes mellitus with neurological complications: is stable hgb a1c 4.9 will continue metformin 500 mg twice daily    14. Delusion: at this time will not make medication changes; Joe Wilkinson is not in any distress from them.   15. Dyslipidemia associated with type 2 diabetes mellitus: is stable LDL 64 will continue lipitor 40 mg daily   16. Hypertension associated with type 2 diabetes mellitus: is stable b/p 114/66 will continue lopressor 12.5 mg twice daily   In march will check cbc; cmp; lipids; hgb a1c   Ok Edwards NP [facility]  call [phone]

## 2021-10-14 ENCOUNTER — Non-Acute Institutional Stay (SKILLED_NURSING_FACILITY): Payer: Medicaid Other | Admitting: Adult Health

## 2021-10-14 ENCOUNTER — Encounter: Payer: Self-pay | Admitting: Adult Health

## 2021-10-14 DIAGNOSIS — F22 Delusional disorders: Secondary | ICD-10-CM

## 2021-10-14 NOTE — Progress Notes (Signed)
? ?Location:  [place] ?Nursing Home Room Number: 341 ?Place of Service:   SNF ? ? ?CODE STATUS: dnr  ? ?Allergies  ?Allergen Reactions  ? Cheese   ? Penicillins Hives  ?  Has patient had a PCN reaction causing immediate rash, facial/tongue/throat swelling, SOB or lightheadedness with hypotension: Unknown ?Has patient had a PCN reaction causing severe rash involving mucus membranes or skin necrosis: Unknown ?Has patient had a PCN reaction that required hospitalization: No ?Has patient had a PCN reaction occurring within the last 10 years: No ?If all of the above answers are "NO", then may proceed with Cephalosporin use. ?  ? ? ?Chief Complaint  ?Patient presents with  ? Acute Visit  ?  Behaviors   ? ? ?HPI: ? ?Since starting seroquel his behaviors have improved; however over the past several days he has started acting out once again. He is yelling at staff; has paranoia. His blood pressure readings are low. He is getting out of bed daily.  ?  ? ?Past Medical History:  ?Diagnosis Date  ? DM (diabetes mellitus) ([place])   ? History of seizures   ? Hypertension   ? Stroke [facility])   ? ? ?Past Surgical History:  ?Procedure Laterality Date  ? CRANIECTOMY Right 04/01/2017  ? Procedure: RIGHT DECOMPRESSIVE CRANIECTOMY;  Surgeon: Ditty, Kevan Ny, MD;  Location: [place];  Service: Neurosurgery;  Laterality: Right;  ? ESOPHAGOGASTRODUODENOSCOPY N/A 04/13/2017  ? Procedure: ESOPHAGOGASTRODUODENOSCOPY (EGD);  Surgeon: Georganna Skeans, MD;  Location: [place];  Service: General;  Laterality: N/A;  bedside  ? INCISION AND DRAINAGE PERIRECTAL ABSCESS N/A 09/28/2017  ? Procedure: IRRIGATION AND DEBRIDEMENT PERIANAL ABSCESS;  Surgeon: Virl Cagey, MD;  Location: [facility];  Service: General;  Laterality: N/A;  ? PEG PLACEMENT N/A 04/13/2017  ? Procedure: PERCUTANEOUS ENDOSCOPIC GASTROSTOMY (PEG) PLACEMENT;  Surgeon: Georganna Skeans, MD;  Location: [place];  Service: General;  Laterality: N/A;  ? ? ?Social History   ? ?Socioeconomic History  ? Marital status: Unknown  ?  Spouse name: Not on file  ? Number of children: Not on file  ? Years of education: Not on file  ? Highest education level: Not on file  ?Occupational History  ? Not on file  ?Tobacco Use  ? Smoking status: Former  ?  Types: Cigarettes  ? Smokeless tobacco: Never  ? Tobacco comments:  ?  UTA  ?Vaping Use  ? Vaping Use: Never used  ?Substance and Sexual Activity  ? Alcohol use: No  ?  Comment: UTA  ? Drug use: No  ?  Comment: UTA  ? Sexual activity: Not Currently  ?  Birth control/protection: None  ?Other Topics Concern  ? Not on file  ?Social History Narrative  ? Not on file  ? ?Social Determinants of Health  ? ?Financial Resource Strain: Not on file  ?Food Insecurity: Not on file  ?Transportation Needs: Not on file  ?Physical Activity: Not on file  ?Stress: Not on file  ?Social Connections: Not on file  ?Intimate Partner Violence: Not on file  ? ?Family History  ?Problem Relation Age of Onset  ? Hypertension Father   ? ? ? ? ?VITAL SIGNS ?BP 97/73   Pulse 94   Temp 98.1 ?F (36.7 ?C)   Ht '5\' 6"'$  (1.676 m)   Wt 174 lb 9.6 oz (79.2 kg)   BMI 28.18 kg/m?  ? ?Outpatient Encounter Medications as of 10/14/2021  ?Medication Sig  ? acetaminophen (TYLENOL) 325 MG tablet  Take 650 mg by mouth every 4 (four) hours as needed. Not to exceed 3 grams in 24 hours  ? apixaban (ELIQUIS) 5 MG TABS tablet Take 5 mg by mouth 2 (two) times daily.   ? atorvastatin (LIPITOR) 40 MG tablet Take 40 mg by mouth every evening.  ? bisacodyl (DULCOLAX) 10 MG suppository Place 1 suppository (10 mg total) rectally daily as needed for moderate constipation.  ? Cholecalciferol 1.25 MG (50000 UT) capsule Take 50,000 Units by mouth once a week. For Low Vitamin D Level on Monday  ? Dextromethorphan-guaiFENesin (ROBITUSSIN COUGH+CHEST CONG DM PO) Take 10 mLs by mouth every 6 (six) hours as needed.  ? docusate (COLACE) 50 MG/5ML liquid Give 10 ml by mouth twice a day  ? gabapentin (NEURONTIN) 100 MG  capsule Take 100 mg by mouth at bedtime.  ? levETIRAcetam (KEPPRA) 100 MG/ML solution Take 500 mg by mouth 2 (two) times daily. For Seizures Mix in Grape Juice  ? LORazepam (ATIVAN) 2 MG/ML injection Inject 0.5 mLs (1 mg total) into the muscle every 15 (fifteen) minutes as needed. Up to 3 doses for uncontrolled seizure activity  ? Menthol, Topical Analgesic, (BIOFREEZE) 4 % GEL Apply 1 application topically 2 (two) times daily. Apply to left shoulder  ? metoprolol tartrate (LOPRESSOR) 25 MG tablet Take 12.5 mg by mouth daily.  ? NON FORMULARY Regular Diet  ? omeprazole (PRILOSEC) 40 MG capsule Take 40 mg by mouth daily.   ? QUEtiapine (SEROQUEL) 25 MG tablet Take 25 mg by mouth 2 (two) times daily.  ? ?No facility-administered encounter medications on file as of 10/14/2021.  ? ? ? ?SIGNIFICANT DIAGNOSTIC EXAMS ? ?PREVIOUS  ? ?06-10-21: thyroid biopsy  ? ?NO NEW EXAMS.  ? ?LABS REVIEWED PREVIOUS:  ?   ?01-28-21: wbc 11.9; hgb 10.8; hct 37.7; mcv 87.9 plt 208; glucose 99; bun 11; creat 0.63; k+ 3.7; na++ 139; ca 8.9; GFR >60; liver normal albumin 2.9; hgb a1c 5.1   ?05-03-21: hgb a1c 4.9; chol 117; ldl 64; trig 125; hdl 28; mag 2.0; vit D 38.55 micro albumin 92.8 ?08-06-21: wbc 10.0; hgb 10.8 hct 37.8; mcv 76.8 plt 249; glucose 118; bun 11; creat 0.77; k+ 3.1; na++ 136; ca 8.5 GFR>60 ?08-13-21: wbc 11.9; hgb 10.3; hct 36.2; mcv 77.4 plt 251; glucose 69; bun 8; creat 0.69; k+ 3.2; na++ 140; ca 8.3 GFR >60 ? ?NO NEW LABS  ? ?Review of Systems  ?Constitutional:  Negative for malaise/fatigue.  ?Respiratory:  Negative for cough and shortness of breath.   ?Cardiovascular:  Negative for chest pain, palpitations and leg swelling.  ?Gastrointestinal:  Negative for abdominal pain, constipation and heartburn.  ?Musculoskeletal:  Negative for back pain, joint pain and myalgias.  ?Skin: Negative.   ?Neurological:  Negative for dizziness.  ?Psychiatric/Behavioral:  Positive for hallucinations. The patient is not nervous/anxious.    ? ? ?Physical Exam ?Constitutional:   ?   General: He is not in acute distress. ?   Appearance: He is well-developed. He is not diaphoretic.  ?Neck:  ?   Thyroid: Thyroid mass and thyromegaly present.  ?Cardiovascular:  ?   Rate and Rhythm: Normal rate and regular rhythm.  ?   Pulses: Normal pulses.  ?   Heart sounds: Normal heart sounds.  ?Pulmonary:  ?   Effort: Pulmonary effort is normal. No respiratory distress.  ?   Breath sounds: Normal breath sounds.  ?Abdominal:  ?   General: Bowel sounds are normal. There is no distension.  ?  Palpations: Abdomen is soft.  ?   Tenderness: There is no abdominal tenderness.  ?Musculoskeletal:  ?   Cervical back: Neck supple.  ?   Right lower leg: No edema.  ?   Left lower leg: No edema.  ?   Comments: Left hemiplegia    ?Lymphadenopathy:  ?   Cervical: No cervical adenopathy.  ?Skin: ?   General: Skin is warm and dry.  ?Neurological:  ?   Mental Status: He is alert. Mental status is at baseline.  ?Psychiatric:     ?   Mood and Affect: Mood normal.  ? ? ? ?ASSESSMENT/ PLAN: ? ?TODAY ? ?Paranoia ?Delusions ? ?Will change to seroquel 50 mg in the AM and 25 mg in the PM   ? ? ?Ok Edwards NP ?[facility]  ? call [phone]  ? ?

## 2021-10-21 ENCOUNTER — Other Ambulatory Visit (HOSPITAL_COMMUNITY)
Admission: RE | Admit: 2021-10-21 | Discharge: 2021-10-21 | Disposition: A | Discharge status: Home / Self Care | Payer: Medicaid Other | Source: Skilled Nursing Facility | Attending: Adult Health | Admitting: Adult Health

## 2021-10-21 DIAGNOSIS — E1142 Type 2 diabetes mellitus with diabetic polyneuropathy: Secondary | ICD-10-CM | POA: Diagnosis not present

## 2021-10-21 LAB — LIPID PANEL
Cholesterol: 99 mg/dL (ref 0–200)
HDL: 29 mg/dL — ABNORMAL LOW (ref >40)
LDL Cholesterol: 49 mg/dL (ref 0–99)
Total CHOL/HDL Ratio: 3.4 RATIO
Triglycerides: 104 mg/dL (ref <150)
VLDL: 21 mg/dL (ref 0–40)

## 2021-10-21 LAB — COMPREHENSIVE METABOLIC PANEL
ALT: 9 U/L (ref 0–44)
AST: 10 U/L — ABNORMAL LOW (ref 15–41)
Albumin: 2.6 g/dL — ABNORMAL LOW (ref 3.5–5.0)
Alkaline Phosphatase: 68 U/L (ref 38–126)
Anion gap: 10 (ref 5–15)
BUN: 9 mg/dL (ref 6–20)
CO2: 24 mmol/L (ref 22–32)
Calcium: 8.7 mg/dL — ABNORMAL LOW (ref 8.9–10.3)
Chloride: 105 mmol/L (ref 98–111)
Creatinine, Ser: 0.67 mg/dL (ref 0.61–1.24)
GFR, Estimated: >60 mL/min (ref >60)
Glucose, Bld: 133 mg/dL — ABNORMAL HIGH (ref 70–99)
Potassium: 4 mmol/L (ref 3.5–5.1)
Sodium: 139 mmol/L (ref 135–145)
Total Bilirubin: 0.2 mg/dL — ABNORMAL LOW (ref 0.3–1.2)
Total Protein: 6.1 g/dL — ABNORMAL LOW (ref 6.5–8.1)

## 2021-10-22 LAB — HEMOGLOBIN A1C
Hgb A1c MFr Bld: 5.7 % — ABNORMAL HIGH (ref 4.8–5.6)
Mean Plasma Glucose: 117 mg/dL

## 2021-11-05 ENCOUNTER — Non-Acute Institutional Stay (SKILLED_NURSING_FACILITY): Payer: Medicaid Other | Admitting: Internal Medicine

## 2021-11-05 ENCOUNTER — Encounter: Payer: Self-pay | Admitting: Internal Medicine

## 2021-11-05 DIAGNOSIS — E43 Unspecified severe protein-calorie malnutrition: Secondary | ICD-10-CM | POA: Diagnosis not present

## 2021-11-05 DIAGNOSIS — E042 Nontoxic multinodular goiter: Secondary | ICD-10-CM

## 2021-11-05 DIAGNOSIS — D509 Iron deficiency anemia, unspecified: Secondary | ICD-10-CM | POA: Diagnosis not present

## 2021-11-05 DIAGNOSIS — I480 Paroxysmal atrial fibrillation: Secondary | ICD-10-CM

## 2021-11-05 DIAGNOSIS — E1149 Type 2 diabetes mellitus with other diabetic neurological complication: Secondary | ICD-10-CM

## 2021-11-05 NOTE — Patient Instructions (Signed)
See assessment and plan under each diagnosis in the problem list and acutely for this visit 

## 2021-11-05 NOTE — Progress Notes (Signed)
?  ? ?NURSING HOME LOCATION: [place] ?ROOM NUMBER:  113 ? ?CODE STATUS:  DNR ? ?PCP: Ok Edwards NP,PSC ? ?This is a nursing facility follow up visit of chronic medical diagnoses & to document compliance with Regulation 483.30 (c) in [employer] [place] Phase [employer] which mandates caregiver visit ( visits can alternate among physician, PA or NP as per statutes) within 10 days of 30 days / 60 days/ 90 days post admission to SNF date   ? ?Interim medical record and care since last SNF visit was updated with review of diagnostic studies and change in clinical status since last visit were documented. ? ?HPI: Joe Wilkinson is a permanent resident of this facility with history of diabetes with peripheral vascular disease, history of seizures, essential hypertension, & history of stroke. ? 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  Total protein is 6.1 and albumin 2.6 indicating protein/caloric malnutrition.  LDL is at goal with a value of 49.  A1c is prediabetic at 5.7%.  Anemia is stable with H/H of 10.3/36.2.  Indices are microcytic, hypochromic.TSH is therapeutic @ 1.23. ? ?Review of systems: Review of systems are essentially negative. Joe Wilkinson did give the correct month and year as well as the name of the POTUS but responses were slow. Joe Wilkinson does describe chronic pain on the left side.  Joe Wilkinson states Joe Wilkinson has occasional tingling in the right hand.  Joe Wilkinson is intolerant to cold.  Joe Wilkinson also is constipated.   ?Constitutional: No fever, significant weight change, fatigue  ?Eyes: No redness, discharge, pain, vision change ?ENT/mouth: No nasal congestion,  purulent discharge, earache, change in hearing, sore throat  ?Cardiovascular: No chest pain, palpitations, paroxysmal nocturnal dyspnea, edema  ?Respiratory: No cough, sputum production, hemoptysis, DOE, significant snoring, apnea   ?Gastrointestinal: No heartburn, dysphagia, abdominal pain, nausea /vomiting, rectal  bleeding, melena ?Genitourinary: No dysuria, hematuria, pyuria, incontinence, nocturia ?Musculoskeletal: No joint stiffness, joint swelling ?Dermatologic: No rash, pruritus, change in appearance of skin ?Neurologic: No dizziness, headache, syncope, seizures ?Psychiatric: No significant anxiety, depression, insomnia, anorexia ?Endocrine: No change in hair/skin/nails, excessive thirst, excessive hunger, excessive urination  ?Hematologic/lymphatic: No significant bruising, lymphadenopathy, abnormal bleeding ?Allergy/immunology: No itchy/watery eyes, significant sneezing, urticaria, angioedema ? ?Physical exam:  ?Pertinent or positive findings: Facies are blank.  Joe Wilkinson exhibits some dysarthria.  Ptosis is greater on the right than the left.  The maxilla is edentulous.  The mandibular teeth exhibit poor hygiene.  S1 is increased.  Rhythm is irregular.  Pedal pulses are decreased.  Clubbing of the nailbeds is noted.  Left hemiparesis is present.  The left hand is clawed. ? ?General appearance: Adequately nourished; no acute distress, increased work of breathing is present.   ?Lymphatic: No lymphadenopathy about the head, neck, axilla. ?Eyes: No conjunctival inflammation or lid edema is present. There is no scleral icterus. ?Ears:  External ear exam shows no significant lesions or deformities.   ?Nose:  External nasal examination shows no deformity or inflammation. Nasal mucosa are pink and moist without lesions, exudates ?Neck:  No thyromegaly, masses, tenderness noted.    ?Heart:  No gallop, murmur, click, rub .  ?Lungs: Chest clear to auscultation without wheezes, rhonchi, rales, rubs. ?Abdomen: Bowel sounds are normal. Abdomen is soft and nontender with no organomegaly, hernias, masses. ?GU: Deferred  ?Extremities:  No cyanosis, clubbing, edema  ?Neurologic exam :Balance, Rhomberg, finger to nose testing could not be completed due to clinical state ?  Skin: Warm & dry w/o tenting. ?No significant lesions or rash. ? ?See  summary under each active problem in the Problem List with associated updated therapeutic plan ? ? ?

## 2021-11-05 NOTE — Assessment & Plan Note (Signed)
Current A1c is prediabetic at 5.7%.  No change indicated. ?

## 2021-11-05 NOTE — Assessment & Plan Note (Addendum)
Current TSH is therapeutic at 1.23.  Endocrinology F/U with Dr Dorris Fetch, ? Sept 2023 ?

## 2021-11-05 NOTE — Assessment & Plan Note (Signed)
Today rate is well controlled although the rhythm is slightly irregular. ?

## 2021-11-05 NOTE — Assessment & Plan Note (Signed)
Anemia stable with current H/H of 10.3/36.2 with microcytic, hypochromic indices.  No bleeding dyscrasias reported at the SNF. ?

## 2021-11-05 NOTE — Assessment & Plan Note (Addendum)
Current total protein 6.1, albumin 2.6 ?L hemiparesis present. ?Moderate - severe protein caloric malnutrition ?Nutrition consult at SNF ? ?

## 2021-11-24 ENCOUNTER — Encounter: Payer: Self-pay | Admitting: Adult Health

## 2021-11-24 ENCOUNTER — Non-Acute Institutional Stay (SKILLED_NURSING_FACILITY): Payer: Medicaid Other | Admitting: Adult Health

## 2021-11-24 DIAGNOSIS — E1142 Type 2 diabetes mellitus with diabetic polyneuropathy: Secondary | ICD-10-CM | POA: Diagnosis not present

## 2021-11-24 DIAGNOSIS — E785 Hyperlipidemia, unspecified: Secondary | ICD-10-CM

## 2021-11-24 DIAGNOSIS — E43 Unspecified severe protein-calorie malnutrition: Secondary | ICD-10-CM | POA: Diagnosis not present

## 2021-11-24 DIAGNOSIS — E1169 Type 2 diabetes mellitus with other specified complication: Secondary | ICD-10-CM | POA: Diagnosis not present

## 2021-11-24 DIAGNOSIS — R569 Unspecified convulsions: Secondary | ICD-10-CM | POA: Diagnosis not present

## 2021-11-24 NOTE — Progress Notes (Signed)
?Location:  [place] ?Nursing Home Room Number: 113-D ?Place of Service:  SNF (31) ? ? ?CODE STATUS: DNR ? ?Allergies  ?Allergen Reactions  ? Cheese   ? Penicillins Hives  ?  Has patient had a PCN reaction causing immediate rash, facial/tongue/throat swelling, SOB or lightheadedness with hypotension: Unknown ?Has patient had a PCN reaction causing severe rash involving mucus membranes or skin necrosis: Unknown ?Has patient had a PCN reaction that required hospitalization: No ?Has patient had a PCN reaction occurring within the last 10 years: No ?If all of the above answers are "NO", then may proceed with Cephalosporin use. ?  ? ? ?Chief Complaint  ?Patient presents with  ? Medical Management of Chronic Issues ? ?                       Protein calorie malnutrition severe:  Dyslipidemia associated with type 2 diabetes mellitus:  Diabetic peripheral neuropathy: Seizure:   ? ? ?HPI: ? ?He is a 61 year old long term resident of this facility being seen for the management of his chronic illnesses:  Protein calorie malnutrition severe:  Dyslipidemia associated with type 2 diabetes mellitus:  Diabetic peripheral neuropathy: Seizure. There are no reports of uncontrolled pain. His weight is stable. There are no reports of delusional thoughts.  ? ?Past Medical History:  ?Diagnosis Date  ? DM (diabetes mellitus) ([place])   ? History of seizures   ? Hypertension   ? Stroke [facility])   ? ? ?Past Surgical History:  ?Procedure Laterality Date  ? CRANIECTOMY Right 04/01/2017  ? Procedure: RIGHT DECOMPRESSIVE CRANIECTOMY;  Surgeon: Ditty, Kevan Ny, MD;  Location: [place];  Service: Neurosurgery;  Laterality: Right;  ? ESOPHAGOGASTRODUODENOSCOPY N/A 04/13/2017  ? Procedure: ESOPHAGOGASTRODUODENOSCOPY (EGD);  Surgeon: Georganna Skeans, MD;  Location: [place];  Service: General;  Laterality: N/A;  bedside  ? INCISION AND DRAINAGE PERIRECTAL ABSCESS N/A 09/28/2017  ? Procedure: IRRIGATION AND DEBRIDEMENT PERIANAL ABSCESS;   Surgeon: Virl Cagey, MD;  Location: [facility];  Service: General;  Laterality: N/A;  ? PEG PLACEMENT N/A 04/13/2017  ? Procedure: PERCUTANEOUS ENDOSCOPIC GASTROSTOMY (PEG) PLACEMENT;  Surgeon: Georganna Skeans, MD;  Location: [place];  Service: General;  Laterality: N/A;  ? ? ?Social History  ? ?Socioeconomic History  ? Marital status: Unknown  ?  Spouse name: Not on file  ? Number of children: Not on file  ? Years of education: Not on file  ? Highest education level: Not on file  ?Occupational History  ? Not on file  ?Tobacco Use  ? Smoking status: Former  ?  Types: Cigarettes  ? Smokeless tobacco: Never  ? Tobacco comments:  ?  UTA  ?Vaping Use  ? Vaping Use: Never used  ?Substance and Sexual Activity  ? Alcohol use: No  ?  Comment: UTA  ? Drug use: No  ?  Comment: UTA  ? Sexual activity: Not Currently  ?  Birth control/protection: None  ?Other Topics Concern  ? Not on file  ?Social History Narrative  ? Not on file  ? ?Social Determinants of Health  ? ?Financial Resource Strain: Not on file  ?Food Insecurity: Not on file  ?Transportation Needs: Not on file  ?Physical Activity: Not on file  ?Stress: Not on file  ?Social Connections: Not on file  ?Intimate Partner Violence: Not on file  ? ?Family History  ?Problem Relation Age of Onset  ? Hypertension Father   ? ? ? ? ?VITAL  SIGNS ?BP 138/78   Pulse 78   Temp 98.4 ?F (36.9 ?C)   Resp 20   Ht '5\' 6"'$  (1.676 m)   Wt 174 lb 9.6 oz (79.2 kg)   SpO2 97%   BMI 28.18 kg/m?  ? ?Outpatient Encounter Medications as of 11/24/2021  ?Medication Sig  ? acetaminophen (TYLENOL) 325 MG tablet Take 650 mg by mouth every 4 (four) hours as needed. Not to exceed 3 grams in 24 hours  ? apixaban (ELIQUIS) 5 MG TABS tablet Take 5 mg by mouth 2 (two) times daily.   ? atorvastatin (LIPITOR) 40 MG tablet Take 40 mg by mouth every evening.  ? bisacodyl (DULCOLAX) 10 MG suppository Place 1 suppository (10 mg total) rectally daily as needed for moderate constipation.  ?  Cholecalciferol 1.25 MG (50000 UT) capsule Take 50,000 Units by mouth once a week. For Low Vitamin D Level on Monday  ? Dextromethorphan-guaiFENesin (ROBITUSSIN COUGH+CHEST CONG DM PO) Take 10 mLs by mouth every 6 (six) hours as needed.  ? docusate (COLACE) 50 MG/5ML liquid Give 10 ml by mouth twice a day  ? gabapentin (NEURONTIN) 100 MG capsule Take 100 mg by mouth at bedtime.  ? levETIRAcetam (KEPPRA) 100 MG/ML solution Take 500 mg by mouth 2 (two) times daily. For Seizures Mix in Grape Juice  ? LORazepam (ATIVAN) 2 MG/ML injection Inject 0.5 mLs (1 mg total) into the muscle every 15 (fifteen) minutes as needed. Up to 3 doses for uncontrolled seizure activity  ? Menthol, Topical Analgesic, (BIOFREEZE) 4 % GEL Apply 1 application topically 2 (two) times daily. Apply to left shoulder  ? NON FORMULARY Regular Diet  ? omeprazole (PRILOSEC) 40 MG capsule Take 40 mg by mouth daily.   ? QUEtiapine (SEROQUEL) 25 MG tablet Take 25 mg by mouth 2 (two) times daily.  ? metoprolol tartrate (LOPRESSOR) 25 MG tablet Take 12.5 mg by mouth daily.  ? ?No facility-administered encounter medications on file as of 11/24/2021.  ? ? ? ?SIGNIFICANT DIAGNOSTIC EXAMS ? ?PREVIOUS  ? ?06-10-21: thyroid biopsy  ? ?NO NEW EXAMS.  ? ?LABS REVIEWED PREVIOUS:  ?  ?01-28-21: wbc 11.9; hgb 10.8; hct 37.7; mcv 87.9 plt 208; glucose 99; bun 11; creat 0.63; k+ 3.7; na++ 139; ca 8.9; GFR >60; liver normal albumin 2.9; hgb a1c 5.1   ?05-03-21: hgb a1c 4.9; chol 117; ldl 64; trig 125; hdl 28; mag 2.0; vit D 38.55 micro albumin 92.8 ?08-06-21: wbc 10.0; hgb 10.8 hct 37.8; mcv 76.8 plt 249; glucose 118; bun 11; creat 0.77; k+ 3.1; na++ 136; ca 8.5 GFR>60 ?08-13-21: wbc 11.9; hgb 10.3; hct 36.2; mcv 77.4 plt 251; glucose 69; bun 8; creat 0.69; k+ 3.2; na++ 140; ca 8.3 GFR >60 ? ?TODAY ? ?10-21-21: glucose 133; bun 9; creat 0.67; k+ 4.0; na++ 139; ca 8.7; protein 6.1; albumin 2.6; hgb a1c 5.7; chol 99; ldl 49; trig 104; hdl 29  ? ?Review of Systems   ?Constitutional:  Negative for malaise/fatigue.  ?Respiratory:  Negative for cough and shortness of breath.   ?Cardiovascular:  Negative for chest pain, palpitations and leg swelling.  ?Gastrointestinal:  Negative for abdominal pain, constipation and heartburn.  ?Musculoskeletal:  Negative for back pain, joint pain and myalgias.  ?Skin: Negative.   ?Neurological:  Negative for dizziness.  ?Psychiatric/Behavioral:  The patient is not nervous/anxious.   ? ?Physical Exam ?Constitutional:   ?   General: He is not in acute distress. ?   Appearance: He is well-developed. He is not  diaphoretic.  ?Neck:  ?   Thyroid: Thyroid mass and thyromegaly present.  ?Cardiovascular:  ?   Rate and Rhythm: Normal rate and regular rhythm.  ?   Pulses: Normal pulses.  ?   Heart sounds: Normal heart sounds.  ?Pulmonary:  ?   Effort: Pulmonary effort is normal. No respiratory distress.  ?   Breath sounds: Normal breath sounds.  ?Abdominal:  ?   General: Bowel sounds are normal. There is no distension.  ?   Palpations: Abdomen is soft.  ?   Tenderness: There is no abdominal tenderness.  ?Musculoskeletal:  ?   Cervical back: Neck supple.  ?   Right lower leg: No edema.  ?   Left lower leg: No edema.  ?   Comments: Left hemiplegia   ?Lymphadenopathy:  ?   Cervical: No cervical adenopathy.  ?Skin: ?   General: Skin is warm and dry.  ?Neurological:  ?   Mental Status: He is alert. Mental status is at baseline.  ?Psychiatric:     ?   Mood and Affect: Mood normal.  ? ? ? ?ASSESSMENT/ PLAN: ? ?TODAY;  ? ?Protein calorie malnutrition severe: albumin 2.6 will begin prostat with meals ? ?2. Dyslipidemia associated with type 2 diabetes mellitus: is stable ldl 49 will lower lipitor to 20 mg daily  ? ?3. Diabetic peripheral neuropathy: will continue gabapentin 100 mg nightly  ? ?4. Seizure: without recent activity will continue keppra 500 mg twice daily  ? ?PREVIOUS  ? ?5. Chronic constipation: is stable will continue colace twice daily  ? ?6.  Cerebrovascular accident (CVA) due to embolism of right middle cerebral artery: is s/p compression of right  frontotemporoparteital craniotomy with durplasty 2018: is stable will continue eliquis 5 mg twice daily  ? ?7. Vitamin D deficiency: is stable

## 2021-12-23 ENCOUNTER — Encounter: Payer: Self-pay | Admitting: Adult Health

## 2021-12-23 ENCOUNTER — Non-Acute Institutional Stay (SKILLED_NURSING_FACILITY): Payer: Medicaid Other | Admitting: Adult Health

## 2021-12-23 DIAGNOSIS — F22 Delusional disorders: Secondary | ICD-10-CM | POA: Diagnosis not present

## 2021-12-23 NOTE — Progress Notes (Signed)
Location:  [place] Room Number: 113-D Place of Service:  SNF (31)   CODE STATUS: DNR  Allergies  Allergen Reactions   Cheese    Penicillins Hives    Has patient had a PCN reaction causing immediate rash, facial/tongue/throat swelling, SOB or lightheadedness with hypotension: Unknown Has patient had a PCN reaction causing severe rash involving mucus membranes or skin necrosis: Unknown Has patient had a PCN reaction that required hospitalization: No Has patient had a PCN reaction occurring within the last 10 years: No If all of the above answers are "NO", then may proceed with Cephalosporin use.     Chief Complaint  Patient presents with   Acute Visit    Delusions     HPI:  He has a history of and cva and a right decompressive craniectomy in 2018. He does have delusions people are talking about him. He is yelling and cussing at staff. This does cause him emotional distress. He is presently taking seroquel 50 mg in the AM and 25 mg in the PM.   Past Medical History:  Diagnosis Date   DM (diabetes mellitus) ([place])    History of seizures    Hypertension    Stroke [facility])     Past Surgical History:  Procedure Laterality Date   CRANIECTOMY Right 04/01/2017   Procedure: RIGHT DECOMPRESSIVE CRANIECTOMY;  Surgeon: Ditty, Kevan Ny, MD;  Location: [place];  Service: Neurosurgery;  Laterality: Right;   ESOPHAGOGASTRODUODENOSCOPY N/A 04/13/2017   Procedure: ESOPHAGOGASTRODUODENOSCOPY (EGD);  Surgeon: Georganna Skeans, MD;  Location: [place];  Service: General;  Laterality: N/A;  bedside   INCISION AND DRAINAGE PERIRECTAL ABSCESS N/A 09/28/2017   Procedure: IRRIGATION AND DEBRIDEMENT PERIANAL ABSCESS;  Surgeon: Virl Cagey, MD;  Location: [facility];  Service: General;  Laterality: N/A;   PEG PLACEMENT N/A 04/13/2017   Procedure: PERCUTANEOUS ENDOSCOPIC GASTROSTOMY (PEG) PLACEMENT;  Surgeon: Georganna Skeans, MD;  Location: [facility] ENDOSCOPY;  Service: General;   Laterality: N/A;    Social History   Socioeconomic History   Marital status: Unknown    Spouse name: Not on file   Number of children: Not on file   Years of education: Not on file   Highest education level: Not on file  Occupational History   Not on file  Tobacco Use   Smoking status: Former    Types: Cigarettes   Smokeless tobacco: Never   Tobacco comments:    UTA  Vaping Use   Vaping Use: Never used  Substance and Sexual Activity   Alcohol use: No    Comment: UTA   Drug use: No    Comment: UTA   Sexual activity: Not Currently    Birth control/protection: None  Other Topics Concern   Not on file  Social History Narrative   Not on file   Social Determinants of Health   Financial Resource Strain: Not on file  Food Insecurity: Not on file  Transportation Needs: Not on file  Physical Activity: Not on file  Stress: Not on file  Social Connections: Not on file  Intimate Partner Violence: Not on file   Family History  Problem Relation Age of Onset   Hypertension Father       VITAL SIGNS BP 120/74   Pulse 74   Temp (!) 96.9 F (36.1 C)   Resp 20   Ht '5\' 6"'$  (1.676 m)   Wt 174 lb 6.4 oz (79.1 kg)   SpO2 97%   BMI 28.15 kg/m  Outpatient Encounter Medications as of 12/23/2021  Medication Sig   acetaminophen (TYLENOL) 325 MG tablet Take 650 mg by mouth every 4 (four) hours as needed. Not to exceed 3 grams in 24 hours   apixaban (ELIQUIS) 5 MG TABS tablet Take 5 mg by mouth 2 (two) times daily.    atorvastatin (LIPITOR) 20 MG tablet Take 20 mg by mouth daily.   bisacodyl (DULCOLAX) 10 MG suppository Place 1 suppository (10 mg total) rectally daily as needed for moderate constipation.   Cholecalciferol 1.25 MG (50000 UT) capsule Take 50,000 Units by mouth once a week. For Low Vitamin D Level on Monday   Dextromethorphan-guaiFENesin (ROBITUSSIN COUGH+CHEST CONG DM PO) Take 10 mLs by mouth every 6 (six) hours as needed.   docusate (COLACE) 50 MG/5ML liquid Give  10 ml by mouth twice a day   Ensure Max Protein (ENSURE MAX PROTEIN) LIQD daily to support protein repletion   gabapentin (NEURONTIN) 100 MG capsule Take 100 mg by mouth at bedtime.   levETIRAcetam (KEPPRA) 100 MG/ML solution Take 500 mg by mouth 2 (two) times daily. For Seizures Mix in Grape Juice   LORazepam (ATIVAN) 2 MG/ML injection Inject 0.5 mLs (1 mg total) into the muscle every 15 (fifteen) minutes as needed. Up to 3 doses for uncontrolled seizure activity   Menthol, Topical Analgesic, (BIOFREEZE) 4 % GEL Apply 1 application topically 2 (two) times daily. Apply to left shoulder   NON FORMULARY Diet:Regular   omeprazole (PRILOSEC) 40 MG capsule Take 40 mg by mouth daily.    metoprolol tartrate (LOPRESSOR) 25 MG tablet Take 12.5 mg by mouth daily.   [DISCONTINUED] atorvastatin (LIPITOR) 40 MG tablet Take 40 mg by mouth every evening.   [DISCONTINUED] QUEtiapine (SEROQUEL) 25 MG tablet Take 25 mg by mouth 2 (two) times daily.   No facility-administered encounter medications on file as of 12/23/2021.     SIGNIFICANT DIAGNOSTIC EXAMS  PREVIOUS   06-10-21: thyroid biopsy   NO NEW EXAMS.   LABS REVIEWED PREVIOUS:    01-28-21: wbc 11.9; hgb 10.8; hct 37.7; mcv 87.9 plt 208; glucose 99; bun 11; creat 0.63; k+ 3.7; na++ 139; ca 8.9; GFR >60; liver normal albumin 2.9; hgb a1c 5.1   05-03-21: hgb a1c 4.9; chol 117; ldl 64; trig 125; hdl 28; mag 2.0; vit D 38.55 micro albumin 92.8 08-06-21: wbc 10.0; hgb 10.8 hct 37.8; mcv 76.8 plt 249; glucose 118; bun 11; creat 0.77; k+ 3.1; na++ 136; ca 8.5 GFR>60 08-13-21: wbc 11.9; hgb 10.3; hct 36.2; mcv 77.4 plt 251; glucose 69; bun 8; creat 0.69; k+ 3.2; na++ 140; ca 8.3 GFR >60 10-21-21: glucose 133; bun 9; creat 0.67; k+ 4.0; na++ 139; ca 8.7; protein 6.1; albumin 2.6; hgb a1c 5.7; chol 99; ldl 49; trig 104; hdl 29   NO NEW LABS.    Review of Systems  Constitutional:  Negative for malaise/fatigue.  Respiratory:  Negative for cough and shortness of  breath.   Cardiovascular:  Negative for chest pain, palpitations and leg swelling.  Gastrointestinal:  Negative for abdominal pain, constipation and heartburn.  Musculoskeletal:  Negative for back pain, joint pain and myalgias.  Skin: Negative.   Neurological:  Negative for dizziness.  Psychiatric/Behavioral:  The patient is not nervous/anxious.    Physical Exam Constitutional:      General: He is not in acute distress.    Appearance: He is well-developed. He is not diaphoretic.  Neck:     Thyroid: Thyroid mass and thyromegaly present.  Cardiovascular:     Rate and Rhythm: Normal rate and regular rhythm.     Pulses: Normal pulses.     Heart sounds: Normal heart sounds.  Pulmonary:     Effort: Pulmonary effort is normal. No respiratory distress.     Breath sounds: Normal breath sounds.  Abdominal:     General: Bowel sounds are normal. There is no distension.     Palpations: Abdomen is soft.     Tenderness: There is no abdominal tenderness.  Musculoskeletal:     Cervical back: Neck supple.     Right lower leg: No edema.     Left lower leg: No edema.     Comments:  Left hemiplegia    Lymphadenopathy:     Cervical: No cervical adenopathy.  Skin:    General: Skin is warm and dry.  Neurological:     Mental Status: He is alert. Mental status is at baseline.  Psychiatric:        Mood and Affect: Mood normal.     ASSESSMENT/ PLAN:  TODAY  Delusion: is worse will increase seroquel to 100 mg twice daily and will monitor his response.    Ok Edwards NP [facility]  call [phone]

## 2021-12-28 ENCOUNTER — Encounter: Payer: Self-pay | Admitting: Internal Medicine

## 2021-12-28 DIAGNOSIS — F0150 Vascular dementia without behavioral disturbance: Secondary | ICD-10-CM | POA: Insufficient documentation

## 2021-12-30 ENCOUNTER — Encounter: Payer: Self-pay | Admitting: Adult Health

## 2021-12-30 ENCOUNTER — Non-Acute Institutional Stay (SKILLED_NURSING_FACILITY): Payer: Medicaid Other | Admitting: Adult Health

## 2021-12-30 DIAGNOSIS — Z8673 Personal history of transient ischemic attack (TIA), and cerebral infarction without residual deficits: Secondary | ICD-10-CM

## 2021-12-30 DIAGNOSIS — K5909 Other constipation: Secondary | ICD-10-CM | POA: Diagnosis not present

## 2021-12-30 DIAGNOSIS — I69354 Hemiplegia and hemiparesis following cerebral infarction affecting left non-dominant side: Secondary | ICD-10-CM

## 2021-12-30 DIAGNOSIS — E559 Vitamin D deficiency, unspecified: Secondary | ICD-10-CM | POA: Diagnosis not present

## 2021-12-30 NOTE — Progress Notes (Signed)
Location:  [place] Room Number: 113D Place of Service:  SNF (31) Provider:  Ok Edwards, NP   CODE STATUS: DNR  Allergies  Allergen Reactions   Cheese    Penicillins Hives    Has patient had a PCN reaction causing immediate rash, facial/tongue/throat swelling, SOB or lightheadedness with hypotension: Unknown Has patient had a PCN reaction causing severe rash involving mucus membranes or skin necrosis: Unknown Has patient had a PCN reaction that required hospitalization: No Has patient had a PCN reaction occurring within the last 10 years: No If all of the above answers are "NO", then may proceed with Cephalosporin use.     Chief Complaint  Patient presents with   Medical Management of Chronic Issues                          Chronic constipation:. History of CVA;  Vitamin D deficiency:  Hemiplegia affecting left side as late effect of cerebrovascular accident (CVA)     HPI:  He is a 61 year old long term resident of this facility being seen for the management of her chronic illnesses: Chronic constipation:. History of CVA;  Vitamin D deficiency:  Hemiplegia affecting left side as late effect of cerebrovascular accident (CVA). There are no reports of constipation present. He has had his seroquel increased to 100 mg twice daily; is now off lopressor and metformin.   Past Medical History:  Diagnosis Date   DM (diabetes mellitus) ([place])    History of seizures    Hypertension    Stroke [facility])     Past Surgical History:  Procedure Laterality Date   CRANIECTOMY Right 04/01/2017   Procedure: RIGHT DECOMPRESSIVE CRANIECTOMY;  Surgeon: Ditty, Kevan Ny, MD;  Location: [place];  Service: Neurosurgery;  Laterality: Right;   ESOPHAGOGASTRODUODENOSCOPY N/A 04/13/2017   Procedure: ESOPHAGOGASTRODUODENOSCOPY (EGD);  Surgeon: Georganna Skeans, MD;  Location: [place];  Service: General;  Laterality: N/A;  bedside   INCISION AND DRAINAGE PERIRECTAL ABSCESS N/A  09/28/2017   Procedure: IRRIGATION AND DEBRIDEMENT PERIANAL ABSCESS;  Surgeon: Virl Cagey, MD;  Location: [facility];  Service: General;  Laterality: N/A;   PEG PLACEMENT N/A 04/13/2017   Procedure: PERCUTANEOUS ENDOSCOPIC GASTROSTOMY (PEG) PLACEMENT;  Surgeon: Georganna Skeans, MD;  Location: [facility] ENDOSCOPY;  Service: General;  Laterality: N/A;    Social History   Socioeconomic History   Marital status: Unknown    Spouse name: Not on file   Number of children: Not on file   Years of education: Not on file   Highest education level: Not on file  Occupational History   Not on file  Tobacco Use   Smoking status: Former    Types: Cigarettes   Smokeless tobacco: Never   Tobacco comments:    UTA  Vaping Use   Vaping Use: Never used  Substance and Sexual Activity   Alcohol use: No    Comment: UTA   Drug use: No    Comment: UTA   Sexual activity: Not Currently    Birth control/protection: None  Other Topics Concern   Not on file  Social History Narrative   Not on file   Social Determinants of Health   Financial Resource Strain: Not on file  Food Insecurity: Not on file  Transportation Needs: Not on file  Physical Activity: Not on file  Stress: Not on file  Social Connections: Not on file  Intimate Partner Violence: Not on file  Family History  Problem Relation Age of Onset   Hypertension Father       VITAL SIGNS BP 140/76   Pulse 78   Temp 97.8 F (36.6 C)   Ht '5\' 6"'$  (1.676 m)   Wt 174 lb 6.4 oz (79.1 kg)   BMI 28.15 kg/m   Outpatient Encounter Medications as of 12/30/2021  Medication Sig   acetaminophen (TYLENOL) 325 MG tablet Take 650 mg by mouth every 4 (four) hours as needed. Not to exceed 3 grams in 24 hours   apixaban (ELIQUIS) 5 MG TABS tablet Take 5 mg by mouth 2 (two) times daily.    atorvastatin (LIPITOR) 20 MG tablet Take 20 mg by mouth daily.   bisacodyl (DULCOLAX) 10 MG suppository Place 1 suppository (10 mg total) rectally daily as needed for  moderate constipation.   Cholecalciferol 1.25 MG (50000 UT) capsule Take 50,000 Units by mouth once a week. For Low Vitamin D Level on Monday   Dextromethorphan-guaiFENesin (ROBITUSSIN COUGH+CHEST CONG DM PO) Take 10 mLs by mouth every 6 (six) hours as needed.   docusate (COLACE) 50 MG/5ML liquid Give 10 ml by mouth twice a day   Ensure Max Protein (ENSURE MAX PROTEIN) LIQD daily to support protein repletion   gabapentin (NEURONTIN) 100 MG capsule Take 100 mg by mouth at bedtime.   levETIRAcetam (KEPPRA) 100 MG/ML solution Take 500 mg by mouth 2 (two) times daily. For Seizures Mix in Grape Juice   LORazepam (ATIVAN) 2 MG/ML injection Inject 0.5 mLs (1 mg total) into the muscle every 15 (fifteen) minutes as needed. Up to 3 doses for uncontrolled seizure activity   Menthol, Topical Analgesic, (BIOFREEZE) 4 % GEL Apply 1 application topically 2 (two) times daily. Apply to left shoulder   NON FORMULARY Diet:Regular   omeprazole (PRILOSEC) 40 MG capsule Take 40 mg by mouth daily.    QUEtiapine (SEROQUEL) 100 MG tablet Take 100 mg by mouth 2 (two) times daily.   [DISCONTINUED] metoprolol tartrate (LOPRESSOR) 25 MG tablet Take 12.5 mg by mouth daily.   No facility-administered encounter medications on file as of 12/30/2021.     SIGNIFICANT DIAGNOSTIC EXAMS  PREVIOUS   06-10-21: thyroid biopsy   NO NEW EXAMS.   LABS REVIEWED PREVIOUS:    01-28-21: wbc 11.9; hgb 10.8; hct 37.7; mcv 87.9 plt 208; glucose 99; bun 11; creat 0.63; k+ 3.7; na++ 139; ca 8.9; GFR >60; liver normal albumin 2.9; hgb a1c 5.1   05-03-21: hgb a1c 4.9; chol 117; ldl 64; trig 125; hdl 28; mag 2.0; vit D 38.55 micro albumin 92.8 08-06-21: wbc 10.0; hgb 10.8 hct 37.8; mcv 76.8 plt 249; glucose 118; bun 11; creat 0.77; k+ 3.1; na++ 136; ca 8.5 GFR>60 08-13-21: wbc 11.9; hgb 10.3; hct 36.2; mcv 77.4 plt 251; glucose 69; bun 8; creat 0.69; k+ 3.2; na++ 140; ca 8.3 GFR >60 10-21-21: glucose 133; bun 9; creat 0.67; k+ 4.0; na++ 139; ca  8.7; protein 6.1; albumin 2.6; hgb a1c 5.7; chol 99; ldl 49; trig 104; hdl 29  NO NEW LABS.    Review of Systems  Constitutional:  Negative for malaise/fatigue.  Respiratory:  Negative for cough and shortness of breath.   Cardiovascular:  Negative for chest pain, palpitations and leg swelling.  Gastrointestinal:  Negative for abdominal pain, constipation and heartburn.  Musculoskeletal:  Negative for back pain, joint pain and myalgias.  Skin: Negative.   Neurological:  Negative for dizziness.  Psychiatric/Behavioral:  The patient is not nervous/anxious.  Physical Exam Constitutional:      General: He is not in acute distress.    Appearance: He is well-developed. He is not diaphoretic.  Neck:     Thyroid: Thyroid mass and thyromegaly present.  Cardiovascular:     Rate and Rhythm: Normal rate and regular rhythm.     Pulses: Normal pulses.     Heart sounds: Normal heart sounds.  Pulmonary:     Effort: Pulmonary effort is normal. No respiratory distress.     Breath sounds: Normal breath sounds.  Abdominal:     General: Bowel sounds are normal. There is no distension.     Palpations: Abdomen is soft.     Tenderness: There is no abdominal tenderness.  Musculoskeletal:     Cervical back: Neck supple.     Right lower leg: No edema.     Left lower leg: No edema.     Comments: Left hemiplegia     Lymphadenopathy:     Cervical: No cervical adenopathy.  Skin:    General: Skin is warm and dry.  Neurological:     Mental Status: He is alert. Mental status is at baseline.  Psychiatric:        Mood and Affect: Mood normal.      ASSESSMENT/ PLAN:  TODAY;   Chronic constipation: will continue colace twice daily   2. History of CVA; due to embolism of right middle cerebral artery: is s/p compression right frontotemporoparteital craniotomy with duraplasty 2018. Will continue eliquis 5 mg twice daily   3. Vitamin D deficiency: is 38.55; will continue 50, 000 units weekly   4.  Hemiplegia affecting left side as late effect of cerebrovascular accident (CVA) : will continue baclofen 5 mg three times daily   PREVIOUS   5. GERD without esophagitis is stable will continue prilosec 40 mg daily   6. Multinodular goiter; is status post aspiration   7. Type 2 diabetes mellitus with neurological complications: is stable hgb a1c 4.9 will continue to monitor   8. Hypertension associated with type 2 diabetes mellitus: is stable b/p 140/76 is off lopressor will monitor   9. Delusions/paranoia  is presently taking seroquel 100 mg twice daily will not make changes; he is  presently not reporting any delusional thoughts. Will monitor   10. AF (paroxsymal atrial fibrillation) heart rate is stable will continue lopressor 12.5 mg twice daily for rate control and eliquis 5 mg twice daily  11. Hypochromic anemia: is stable hgb 11.9; will monitor   12. Protein calorie malnutrition severe: albumin 2.6 will continue prostat with meals  13. Dyslipidemia associated with type 2 diabetes mellitus: is stable ldl 49 will continue lipitor  20 mg daily   14. Diabetic peripheral neuropathy: will continue gabapentin 100 mg nightly   15. Seizure: without recent activity will continue keppra 500 mg twice daily    Ok Edwards NP [facility]  call [phone]

## 2021-12-31 ENCOUNTER — Non-Acute Institutional Stay (SKILLED_NURSING_FACILITY): Payer: Medicaid Other | Admitting: Adult Health

## 2021-12-31 ENCOUNTER — Encounter: Payer: Self-pay | Admitting: Adult Health

## 2021-12-31 DIAGNOSIS — F01B2 Vascular dementia, moderate, with psychotic disturbance: Secondary | ICD-10-CM

## 2021-12-31 DIAGNOSIS — I480 Paroxysmal atrial fibrillation: Secondary | ICD-10-CM

## 2021-12-31 DIAGNOSIS — I69354 Hemiplegia and hemiparesis following cerebral infarction affecting left non-dominant side: Secondary | ICD-10-CM | POA: Diagnosis not present

## 2021-12-31 NOTE — Progress Notes (Signed)
Location:  [place] Room Number: 113-D Place of Service:  SNF (31)   CODE STATUS: DNR  Allergies  Allergen Reactions   Cheese    Penicillins Hives    Has patient had a PCN reaction causing immediate rash, facial/tongue/throat swelling, SOB or lightheadedness with hypotension: Unknown Has patient had a PCN reaction causing severe rash involving mucus membranes or skin necrosis: Unknown Has patient had a PCN reaction that required hospitalization: No Has patient had a PCN reaction occurring within the last 10 years: No If all of the above answers are "NO", then may proceed with Cephalosporin use.     Chief Complaint  Patient presents with   Acute Visit    Care plan meeting    HPI:  We have come together for his care plan meeting. Family present  BIMS 15/15 mood 0/30. Has been cussing; delusions: people are talking about him. He is nonambulatory dependent for transfers; no falls. He requires extensive to dependent for his adl care. Frequently incontinent of bladder and bowel. Dietary:  weight is 174.2 pounds is stable regular diet 25-75% of meals; feds self with setup. Therapy none at this time. Activities: does not attend group activities enjoys treats. He has always been a "loner" per family  He continues to be followed for his chronic illnesses including: AF (paroxysmal atrial fibrillation)  Hemiparesis affect left side as a late effect of cerebrovascular accident (CVA)  Moderate vascular accident with psychotic disturbance  Past Medical History:  Diagnosis Date   DM (diabetes mellitus) ([place])    History of seizures    Hypertension    Stroke [facility])     Past Surgical History:  Procedure Laterality Date   CRANIECTOMY Right 04/01/2017   Procedure: RIGHT DECOMPRESSIVE CRANIECTOMY;  Surgeon: Ditty, Kevan Ny, MD;  Location: [place];  Service: Neurosurgery;  Laterality: Right;   ESOPHAGOGASTRODUODENOSCOPY N/A 04/13/2017   Procedure:  ESOPHAGOGASTRODUODENOSCOPY (EGD);  Surgeon: Georganna Skeans, MD;  Location: [place];  Service: General;  Laterality: N/A;  bedside   INCISION AND DRAINAGE PERIRECTAL ABSCESS N/A 09/28/2017   Procedure: IRRIGATION AND DEBRIDEMENT PERIANAL ABSCESS;  Surgeon: Virl Cagey, MD;  Location: [facility];  Service: General;  Laterality: N/A;   PEG PLACEMENT N/A 04/13/2017   Procedure: PERCUTANEOUS ENDOSCOPIC GASTROSTOMY (PEG) PLACEMENT;  Surgeon: Georganna Skeans, MD;  Location: [facility] ENDOSCOPY;  Service: General;  Laterality: N/A;    Social History   Socioeconomic History   Marital status: Unknown    Spouse name: Not on file   Number of children: Not on file   Years of education: Not on file   Highest education level: Not on file  Occupational History   Not on file  Tobacco Use   Smoking status: Former    Types: Cigarettes   Smokeless tobacco: Never   Tobacco comments:    UTA  Vaping Use   Vaping Use: Never used  Substance and Sexual Activity   Alcohol use: No    Comment: UTA   Drug use: No    Comment: UTA   Sexual activity: Not Currently    Birth control/protection: None  Other Topics Concern   Not on file  Social History Narrative   Not on file   Social Determinants of Health   Financial Resource Strain: Not on file  Food Insecurity: Not on file  Transportation Needs: Not on file  Physical Activity: Not on file  Stress: Not on file  Social Connections: Not on file  Intimate Partner Violence:  Not on file   Family History  Problem Relation Age of Onset   Hypertension Father       VITAL SIGNS BP (!) 94/58   Pulse 60   Temp 98 F (36.7 C)   Resp 20   Ht '5\' 6"'$  (1.676 m)   Wt 174 lb 6.4 oz (79.1 kg)   SpO2 97%   BMI 28.15 kg/m   Outpatient Encounter Medications as of 12/31/2021  Medication Sig   acetaminophen (TYLENOL) 325 MG tablet Take 650 mg by mouth every 4 (four) hours as needed. Not to exceed 3 grams in 24 hours   apixaban (ELIQUIS) 5 MG TABS tablet Take  5 mg by mouth 2 (two) times daily.    atorvastatin (LIPITOR) 20 MG tablet Take 20 mg by mouth daily.   bisacodyl (DULCOLAX) 10 MG suppository Place 1 suppository (10 mg total) rectally daily as needed for moderate constipation.   Cholecalciferol 1.25 MG (50000 UT) capsule Take 50,000 Units by mouth once a week. For Low Vitamin D Level on Monday   Dextromethorphan-guaiFENesin (ROBITUSSIN COUGH+CHEST CONG DM PO) Take 10 mLs by mouth every 6 (six) hours as needed.   docusate (COLACE) 50 MG/5ML liquid Give 10 ml by mouth twice a day   Ensure Max Protein (ENSURE MAX PROTEIN) LIQD daily to support protein repletion   gabapentin (NEURONTIN) 100 MG capsule Take 100 mg by mouth at bedtime.   levETIRAcetam (KEPPRA) 100 MG/ML solution Take 500 mg by mouth 2 (two) times daily. For Seizures Mix in Grape Juice   LORazepam (ATIVAN) 2 MG/ML injection Inject 0.5 mLs (1 mg total) into the muscle every 15 (fifteen) minutes as needed. Up to 3 doses for uncontrolled seizure activity   Menthol, Topical Analgesic, (BIOFREEZE) 4 % GEL Apply 1 application topically 2 (two) times daily. Apply to left shoulder   NON FORMULARY Diet:Regular   omeprazole (PRILOSEC) 40 MG capsule Take 40 mg by mouth daily.    QUEtiapine (SEROQUEL) 100 MG tablet Take 100 mg by mouth 2 (two) times daily.   No facility-administered encounter medications on file as of 12/31/2021.     SIGNIFICANT DIAGNOSTIC EXAMS  PREVIOUS   06-10-21: thyroid biopsy   NO NEW EXAMS.   LABS REVIEWED PREVIOUS:    01-28-21: wbc 11.9; hgb 10.8; hct 37.7; mcv 87.9 plt 208; glucose 99; bun 11; creat 0.63; k+ 3.7; na++ 139; ca 8.9; GFR >60; liver normal albumin 2.9; hgb a1c 5.1   05-03-21: hgb a1c 4.9; chol 117; ldl 64; trig 125; hdl 28; mag 2.0; vit D 38.55 micro albumin 92.8 08-06-21: wbc 10.0; hgb 10.8 hct 37.8; mcv 76.8 plt 249; glucose 118; bun 11; creat 0.77; k+ 3.1; na++ 136; ca 8.5 GFR>60 08-13-21: wbc 11.9; hgb 10.3; hct 36.2; mcv 77.4 plt 251; glucose 69;  bun 8; creat 0.69; k+ 3.2; na++ 140; ca 8.3 GFR >60 10-21-21: glucose 133; bun 9; creat 0.67; k+ 4.0; na++ 139; ca 8.7; protein 6.1; albumin 2.6; hgb a1c 5.7; chol 99; ldl 49; trig 104; hdl 29   NO NEW LABS.   Review of Systems  Constitutional:  Negative for malaise/fatigue.  Respiratory:  Negative for cough and shortness of breath.   Cardiovascular:  Negative for chest pain, palpitations and leg swelling.  Gastrointestinal:  Negative for abdominal pain, constipation and heartburn.  Musculoskeletal:  Negative for back pain, joint pain and myalgias.  Skin: Negative.   Neurological:  Negative for dizziness.  Psychiatric/Behavioral:  The patient is not nervous/anxious.  Physical Exam Constitutional:      General: He is not in acute distress.    Appearance: He is well-developed. He is not diaphoretic.  Neck:     Thyroid: Thyroid mass and thyromegaly present.  Cardiovascular:     Rate and Rhythm: Normal rate and regular rhythm.     Heart sounds: Normal heart sounds.  Pulmonary:     Effort: Pulmonary effort is normal. No respiratory distress.     Breath sounds: Normal breath sounds.  Abdominal:     General: Bowel sounds are normal. There is no distension.     Palpations: Abdomen is soft.     Tenderness: There is no abdominal tenderness.  Musculoskeletal:     Cervical back: Neck supple.     Right lower leg: No edema.     Left lower leg: No edema.     Comments: Left hemiplegia   Lymphadenopathy:     Cervical: No cervical adenopathy.  Skin:    General: Skin is warm and dry.  Neurological:     Mental Status: He is alert. Mental status is at baseline.  Psychiatric:        Mood and Affect: Mood normal.     ASSESSMENT/ PLAN:  TODAY  AF (paroxysmal atrial fibrillation) Hemiparesis affect left side as a late effect of cerebrovascular accident (CVA)  3. Moderate vascular accident with psychotic disturbance  Will continue current medications Will continue current plan of  care Will continue to monitor his status  Time spent with patient: 40 minutes: medications; dietary; care plan.    Ok Edwards NP [facility] Adult [facility]  call [phone]

## 2022-01-04 ENCOUNTER — Non-Acute Institutional Stay (SKILLED_NURSING_FACILITY): Payer: Medicaid Other | Admitting: Adult Health

## 2022-01-04 ENCOUNTER — Encounter: Payer: Self-pay | Admitting: Adult Health

## 2022-01-04 DIAGNOSIS — E1149 Type 2 diabetes mellitus with other diabetic neurological complication: Secondary | ICD-10-CM | POA: Diagnosis not present

## 2022-01-04 NOTE — Progress Notes (Unsigned)
Location:  [place] Room Number: [place] of Service:  SNF (31) Provider:  Ok Edwards, NP   CODE STATUS: DNR  Allergies  Allergen Reactions   Cheese    Penicillins Hives    Has patient had a PCN reaction causing immediate rash, facial/tongue/throat swelling, SOB or lightheadedness with hypotension: Unknown Has patient had a PCN reaction causing severe rash involving mucus membranes or skin necrosis: Unknown Has patient had a PCN reaction that required hospitalization: No Has patient had a PCN reaction occurring within the last 10 years: No If all of the above answers are "NO", then may proceed with Cephalosporin use.     Chief Complaint  Patient presents with   Acute Visit    Elevated CBG readings    HPI:  His cbg readings have been increasing; now a the 200-300's. He presently is not taking any medications to manage his diabetes. His last hgb a1c is 5.7. there are no reports of excessive hunger or thirst   Past Medical History:  Diagnosis Date   DM (diabetes mellitus) ([place])    History of seizures    Hypertension    Stroke [facility])     Past Surgical History:  Procedure Laterality Date   CRANIECTOMY Right 04/01/2017   Procedure: RIGHT DECOMPRESSIVE CRANIECTOMY;  Surgeon: Ditty, Kevan Ny, MD;  Location: [place];  Service: Neurosurgery;  Laterality: Right;   ESOPHAGOGASTRODUODENOSCOPY N/A 04/13/2017   Procedure: ESOPHAGOGASTRODUODENOSCOPY (EGD);  Surgeon: Georganna Skeans, MD;  Location: [place];  Service: General;  Laterality: N/A;  bedside   INCISION AND DRAINAGE PERIRECTAL ABSCESS N/A 09/28/2017   Procedure: IRRIGATION AND DEBRIDEMENT PERIANAL ABSCESS;  Surgeon: Virl Cagey, MD;  Location: [facility];  Service: General;  Laterality: N/A;   PEG PLACEMENT N/A 04/13/2017   Procedure: PERCUTANEOUS ENDOSCOPIC GASTROSTOMY (PEG) PLACEMENT;  Surgeon: Georganna Skeans, MD;  Location: [facility] ENDOSCOPY;  Service: General;  Laterality: N/A;     Social History   Socioeconomic History   Marital status: Unknown    Spouse name: Not on file   Number of children: Not on file   Years of education: Not on file   Highest education level: Not on file  Occupational History   Not on file  Tobacco Use   Smoking status: Former    Types: Cigarettes   Smokeless tobacco: Never   Tobacco comments:    UTA  Vaping Use   Vaping Use: Never used  Substance and Sexual Activity   Alcohol use: No    Comment: UTA   Drug use: No    Comment: UTA   Sexual activity: Not Currently    Birth control/protection: None  Other Topics Concern   Not on file  Social History Narrative   Not on file   Social Determinants of Health   Financial Resource Strain: Not on file  Food Insecurity: Not on file  Transportation Needs: Not on file  Physical Activity: Not on file  Stress: Not on file  Social Connections: Not on file  Intimate Partner Violence: Not on file   Family History  Problem Relation Age of Onset   Hypertension Father       VITAL SIGNS BP (!) 146/76   Pulse 78   Temp 97.8 F (36.6 C)   Ht '5\' 6"'$  (1.676 m)   Wt 174 lb 6.4 oz (79.1 kg)   BMI 28.15 kg/m   Outpatient Encounter Medications as of 01/04/2022  Medication Sig   acetaminophen (TYLENOL) 325 MG tablet  Take 650 mg by mouth every 4 (four) hours as needed. Not to exceed 3 grams in 24 hours   apixaban (ELIQUIS) 5 MG TABS tablet Take 5 mg by mouth 2 (two) times daily.    atorvastatin (LIPITOR) 20 MG tablet Take 20 mg by mouth daily.   bisacodyl (DULCOLAX) 10 MG suppository Place 1 suppository (10 mg total) rectally daily as needed for moderate constipation.   Cholecalciferol 1.25 MG (50000 UT) capsule Take 50,000 Units by mouth once a week. For Low Vitamin D Level on Monday   Dextromethorphan-guaiFENesin (ROBITUSSIN COUGH+CHEST CONG DM PO) Take 10 mLs by mouth every 6 (six) hours as needed.   docusate (COLACE) 50 MG/5ML liquid Give 10 ml by mouth twice a day    Dulaglutide (TRULICITY) 8.29 HB/7.1IR SOPN Inject 0.75 mg into the skin. Once a day on Wednesday   Ensure Max Protein (ENSURE MAX PROTEIN) LIQD daily to support protein repletion   gabapentin (NEURONTIN) 100 MG capsule Take 100 mg by mouth at bedtime.   levETIRAcetam (KEPPRA) 100 MG/ML solution Take 500 mg by mouth 2 (two) times daily. For Seizures Mix in Grape Juice   LORazepam (ATIVAN) 2 MG/ML injection Inject 0.5 mLs (1 mg total) into the muscle every 15 (fifteen) minutes as needed. Up to 3 doses for uncontrolled seizure activity   Menthol, Topical Analgesic, (BIOFREEZE) 4 % GEL Apply 1 application topically 2 (two) times daily. Apply to left shoulder   NON FORMULARY Diet:Regular   omeprazole (PRILOSEC) 40 MG capsule Take 40 mg by mouth daily.    QUEtiapine (SEROQUEL) 100 MG tablet Take 100 mg by mouth 2 (two) times daily.   No facility-administered encounter medications on file as of 01/04/2022.     SIGNIFICANT DIAGNOSTIC EXAMS  PREVIOUS   06-10-21: thyroid biopsy   NO NEW EXAMS.   LABS REVIEWED PREVIOUS:    01-28-21: wbc 11.9; hgb 10.8; hct 37.7; mcv 87.9 plt 208; glucose 99; bun 11; creat 0.63; k+ 3.7; na++ 139; ca 8.9; GFR >60; liver normal albumin 2.9; hgb a1c 5.1   05-03-21: hgb a1c 4.9; chol 117; ldl 64; trig 125; hdl 28; mag 2.0; vit D 38.55 micro albumin 92.8 08-06-21: wbc 10.0; hgb 10.8 hct 37.8; mcv 76.8 plt 249; glucose 118; bun 11; creat 0.77; k+ 3.1; na++ 136; ca 8.5 GFR>60 08-13-21: wbc 11.9; hgb 10.3; hct 36.2; mcv 77.4 plt 251; glucose 69; bun 8; creat 0.69; k+ 3.2; na++ 140; ca 8.3 GFR >60 10-21-21: glucose 133; bun 9; creat 0.67; k+ 4.0; na++ 139; ca 8.7; protein 6.1; albumin 2.6; hgb a1c 5.7; chol 99; ldl 49; trig 104; hdl 29   NO NEW LABS.   Review of Systems  Constitutional:  Negative for malaise/fatigue.  Respiratory:  Negative for cough and shortness of breath.   Cardiovascular:  Negative for chest pain, palpitations and leg swelling.  Gastrointestinal:   Negative for abdominal pain, constipation and heartburn.  Musculoskeletal:  Negative for back pain, joint pain and myalgias.  Skin: Negative.   Neurological:  Negative for dizziness.  Endo/Heme/Allergies:  Negative for polydipsia.  Psychiatric/Behavioral:  The patient is not nervous/anxious.    Physical Exam Constitutional:      General: He is not in acute distress.    Appearance: He is well-developed. He is not diaphoretic.  Neck:     Thyroid: Thyroid mass and thyromegaly present.  Cardiovascular:     Rate and Rhythm: Normal rate and regular rhythm.     Pulses: Normal pulses.  Heart sounds: Normal heart sounds.  Pulmonary:     Effort: Pulmonary effort is normal. No respiratory distress.     Breath sounds: Normal breath sounds.  Abdominal:     General: Bowel sounds are normal. There is no distension.     Palpations: Abdomen is soft.     Tenderness: There is no abdominal tenderness.  Musculoskeletal:     Cervical back: Neck supple.     Right lower leg: No edema.     Left lower leg: No edema.     Comments: Left hemiplegia  Lymphadenopathy:     Cervical: No cervical adenopathy.  Skin:    General: Skin is warm and dry.  Neurological:     Mental Status: He is alert. Mental status is at baseline.  Psychiatric:        Mood and Affect: Mood normal.     ASSESSMENT/ PLAN:  TODAY  Type 2 diabetes mellitus with neurological complications: is worse will start trulicity 4.23 mg weekly will monitor   Ok Edwards NP [facility]   call [phone]

## 2022-01-06 ENCOUNTER — Other Ambulatory Visit (HOSPITAL_COMMUNITY)
Admission: RE | Admit: 2022-01-06 | Discharge: 2022-01-06 | Disposition: A | Discharge status: Home / Self Care | Payer: Medicaid Other | Source: Skilled Nursing Facility | Attending: Adult Health | Admitting: Adult Health

## 2022-01-06 DIAGNOSIS — Z125 Encounter for screening for malignant neoplasm of prostate: Secondary | ICD-10-CM | POA: Diagnosis not present

## 2022-01-06 DIAGNOSIS — E1142 Type 2 diabetes mellitus with diabetic polyneuropathy: Secondary | ICD-10-CM | POA: Insufficient documentation

## 2022-01-06 LAB — PSA: Prostatic Specific Antigen: 2.14 ng/mL (ref 0.00–4.00)

## 2022-01-19 ENCOUNTER — Non-Acute Institutional Stay (SKILLED_NURSING_FACILITY): Payer: Medicaid Other | Admitting: Internal Medicine

## 2022-01-19 ENCOUNTER — Encounter: Payer: Self-pay | Admitting: Internal Medicine

## 2022-01-19 ENCOUNTER — Other Ambulatory Visit (HOSPITAL_COMMUNITY)
Admission: RE | Admit: 2022-01-19 | Discharge: 2022-01-19 | Disposition: A | Discharge status: Home / Self Care | Payer: Medicaid Other | Source: Skilled Nursing Facility | Attending: Adult Health | Admitting: Adult Health

## 2022-01-19 DIAGNOSIS — R400 Somnolence: Secondary | ICD-10-CM

## 2022-01-19 DIAGNOSIS — I69354 Hemiplegia and hemiparesis following cerebral infarction affecting left non-dominant side: Secondary | ICD-10-CM | POA: Diagnosis not present

## 2022-01-19 DIAGNOSIS — R569 Unspecified convulsions: Secondary | ICD-10-CM | POA: Diagnosis not present

## 2022-01-19 DIAGNOSIS — D509 Iron deficiency anemia, unspecified: Secondary | ICD-10-CM

## 2022-01-19 DIAGNOSIS — I10 Essential (primary) hypertension: Secondary | ICD-10-CM | POA: Insufficient documentation

## 2022-01-19 DIAGNOSIS — E1149 Type 2 diabetes mellitus with other diabetic neurological complication: Secondary | ICD-10-CM | POA: Diagnosis not present

## 2022-01-19 DIAGNOSIS — R4182 Altered mental status, unspecified: Secondary | ICD-10-CM | POA: Insufficient documentation

## 2022-01-19 DIAGNOSIS — E1142 Type 2 diabetes mellitus with diabetic polyneuropathy: Secondary | ICD-10-CM | POA: Diagnosis present

## 2022-01-19 LAB — CBC WITH DIFFERENTIAL/PLATELET
Abs Immature Granulocytes: 0.03 10*3/uL (ref 0.00–0.07)
Basophils Absolute: 0.1 10*3/uL (ref 0.0–0.1)
Basophils Relative: 1 %
Eosinophils Absolute: 0.4 10*3/uL (ref 0.0–0.5)
Eosinophils Relative: 4 %
HCT: 38.7 % — ABNORMAL LOW (ref 39.0–52.0)
Hemoglobin: 11.3 g/dL — ABNORMAL LOW (ref 13.0–17.0)
Immature Granulocytes: 0 %
Lymphocytes Relative: 37 %
Lymphs Abs: 4 10*3/uL (ref 0.7–4.0)
MCH: 22.1 pg — ABNORMAL LOW (ref 26.0–34.0)
MCHC: 29.2 g/dL — ABNORMAL LOW (ref 30.0–36.0)
MCV: 75.6 fL — ABNORMAL LOW (ref 80.0–100.0)
Monocytes Absolute: 0.9 10*3/uL (ref 0.1–1.0)
Monocytes Relative: 8 %
Neutro Abs: 5.3 10*3/uL (ref 1.7–7.7)
Neutrophils Relative %: 50 %
Platelets: 214 10*3/uL (ref 150–400)
RBC: 5.12 MIL/uL (ref 4.22–5.81)
RDW: 19.7 % — ABNORMAL HIGH (ref 11.5–15.5)
WBC: 10.8 10*3/uL — ABNORMAL HIGH (ref 4.0–10.5)
nRBC: 0 % (ref 0.0–0.2)

## 2022-01-19 LAB — COMPREHENSIVE METABOLIC PANEL
ALT: 13 U/L (ref 0–44)
AST: 10 U/L — ABNORMAL LOW (ref 15–41)
Albumin: 2.9 g/dL — ABNORMAL LOW (ref 3.5–5.0)
Alkaline Phosphatase: 61 U/L (ref 38–126)
Anion gap: 4 — ABNORMAL LOW (ref 5–15)
BUN: 11 mg/dL (ref 6–20)
CO2: 23 mmol/L (ref 22–32)
Calcium: 8.6 mg/dL — ABNORMAL LOW (ref 8.9–10.3)
Chloride: 108 mmol/L (ref 98–111)
Creatinine, Ser: 0.69 mg/dL (ref 0.61–1.24)
GFR, Estimated: >60 mL/min (ref >60)
Glucose, Bld: 251 mg/dL — ABNORMAL HIGH (ref 70–99)
Potassium: 3.6 mmol/L (ref 3.5–5.1)
Sodium: 135 mmol/L (ref 135–145)
Total Bilirubin: 0.9 mg/dL (ref 0.3–1.2)
Total Protein: 6.8 g/dL (ref 6.5–8.1)

## 2022-01-19 LAB — TSH: TSH: 1.318 u[IU]/mL (ref 0.350–4.500)

## 2022-01-19 LAB — LACTIC ACID, PLASMA: Lactic Acid, Venous: 0.9 mmol/L (ref 0.5–1.9)

## 2022-01-19 NOTE — Assessment & Plan Note (Signed)
Present profound somnolence could be a postictal state.  Keppra level will be checked.

## 2022-01-19 NOTE — Patient Instructions (Signed)
See assessment and plan under each diagnosis in the problem list and acutely for this visit 

## 2022-01-19 NOTE — Assessment & Plan Note (Signed)
Stat CBC ordered.  If indices remain hypochromic, microcytic, iron panel will be ordered and etiology of anemia pursued.

## 2022-01-19 NOTE — Assessment & Plan Note (Signed)
Because of marked somnolence the status of his left hemiparesis cannot be established although he does have clawing of the left hand.

## 2022-01-19 NOTE — Assessment & Plan Note (Addendum)
Differential is broad and includes postictal state, recurrent CVA in the context of Eliquis, electrolyte imbalance, and hypoglycemia.  If extensive stat labs are nondiagnostic; EEG and CNS imaging will be necessary.

## 2022-01-19 NOTE — Progress Notes (Signed)
   NURSING HOME LOCATION: [facility] ROOM NUMBER:  113 D  CODE STATUS:  DNR  PCP:  Ok Edwards NP,PSC  This is a nursing facility follow up visit of chronic medical diagnoses & to document compliance with Regulation 483.30 (c) in [employer] [place] Manual Phase 2 which mandates caregiver visit ( visits can alternate among physician, PA or NP as per statutes) within 10 days of 30 days / 60 days/ 90 days post admission to SNF date    Interim medical record and care since last SNF visit was updated with review of diagnostic studies and change in clinical status since last visit were documented.  HPI: He is a permanent resident of facility with history of seizures, essential hypertension, history of stroke with left hemiparesis, PAF, GERD, diabetes with neurologic complications, history of multinodular goiter, and history of hypochromic anemia Significant procedures and surgeries include craniotomy and PEG placement.  Lipid panel is current and reveals an LDL of 49, at goal of less than 70 in the context of history of stroke.  HDL is low at 29.  In February 2022 his A1c was 7.5%; but there has been progressive improvement with current A1c of 5.7.  Total protein is reduced at 6.1 and albumin is 2.6 compatible with moderate protein/caloric malnutrition.  CKD stage II was present with GFR greater than 60 and creatinine of 0.67.  Anemia has been relatively stable with H/H of 10.3/36.2; indices are microcytic hypochromic.  Review of systems could not be completed as I could not get him to wake up.  He would mumble indiscernibly but not  open his eyes.  Physical exam:  Pertinent or positive findings: As noted he remained asleep throughout the exam.  He did exhibit some hypopnea as well was some snoring.  He could not be awakened even with vigorous shaking.  Pattern alopecia is present.  He is Geneticist, molecular.  The left nasolabial fold is decreased and the corner of the mouth sags on  the left.  Breath sounds are decreased.  Abdomen is protuberant.  Pedal pulses are decreased but palpable.  There is trace edema at the left sock line.  Left hemiparesis is suggested with clawing of the left hand.  General appearance: Adequately nourished; no acute distress, increased work of breathing is present.   Lymphatic: No lymphadenopathy about the head, neck, axilla. Eyes: No conjunctival inflammation or lid edema is present. There is no scleral icterus. Ears:  External ear exam shows no significant lesions or deformities.   Nose:  External nasal examination shows no deformity or inflammation. Nasal mucosa are pink and moist without lesions, exudates Oral exam:  Lips and gums are healthy appearing. There is no oropharyngeal erythema or exudate. Neck:  No thyromegaly, masses, tenderness noted.    Heart:  No gallop, murmur, click, rub .  Lungs:  without wheezes, rhonchi, rales, rubs. Abdomen: Bowel sounds are normal. Abdomen is soft and nontender with no organomegaly, hernias, masses. GU: Deferred  Extremities:  No cyanosis, clubbing  Neurologic exam :Balance, Rhomberg, finger to nose testing could not be completed due to clinical state Skin: Warm & dry w/o tenting. No significant lesions or rash.  See summary under each active problem in the Problem List with associated updated therapeutic plan

## 2022-01-19 NOTE — Assessment & Plan Note (Signed)
Somnolent state is not due to hypoglycemia as stat glucose was 269.  Current A1c is 5.7%, indicating excellent control.  No change indicated.

## 2022-01-20 LAB — LEVETIRACETAM LEVEL: Levetiracetam Lvl: 13.2 ug/mL (ref 10.0–40.0)

## 2022-02-09 ENCOUNTER — Encounter: Payer: Self-pay | Admitting: Adult Health

## 2022-02-09 ENCOUNTER — Non-Acute Institutional Stay (SKILLED_NURSING_FACILITY): Payer: Medicaid Other | Admitting: Adult Health

## 2022-02-09 DIAGNOSIS — E1149 Type 2 diabetes mellitus with other diabetic neurological complication: Secondary | ICD-10-CM | POA: Diagnosis not present

## 2022-02-09 DIAGNOSIS — R569 Unspecified convulsions: Secondary | ICD-10-CM | POA: Diagnosis not present

## 2022-02-09 NOTE — Progress Notes (Signed)
Location:  [place] Room Number: 113-D Place of Service:  SNF (31)   CODE STATUS: DNR  Allergies  Allergen Reactions   Cheese    Penicillins Hives    Has patient had a PCN reaction causing immediate rash, facial/tongue/throat swelling, SOB or lightheadedness with hypotension: Unknown Has patient had a PCN reaction causing severe rash involving mucus membranes or skin necrosis: Unknown Has patient had a PCN reaction that required hospitalization: No Has patient had a PCN reaction occurring within the last 10 years: No If all of the above answers are "NO", then may proceed with Cephalosporin use.     Chief Complaint  Patient presents with   Acute Visit    Medication review     HPI:  He has been declining hie medications for the past several days. He does not want to take keppra twice daily states that the medicine makes him sleep all day. He is willing to take the Xl formulation. He does want any medication that requires the use of the medications. He is willing to take his other medications.   Past Medical History:  Diagnosis Date   DM (diabetes mellitus) ([place])    History of seizures    Hypertension    Stroke [facility])     Past Surgical History:  Procedure Laterality Date   CRANIECTOMY Right 04/01/2017   Procedure: RIGHT DECOMPRESSIVE CRANIECTOMY;  Surgeon: Ditty, Kevan Ny, MD;  Location: [place];  Service: Neurosurgery;  Laterality: Right;   ESOPHAGOGASTRODUODENOSCOPY N/A 04/13/2017   Procedure: ESOPHAGOGASTRODUODENOSCOPY (EGD);  Surgeon: Georganna Skeans, MD;  Location: [place];  Service: General;  Laterality: N/A;  bedside   INCISION AND DRAINAGE PERIRECTAL ABSCESS N/A 09/28/2017   Procedure: IRRIGATION AND DEBRIDEMENT PERIANAL ABSCESS;  Surgeon: Virl Cagey, MD;  Location: [facility];  Service: General;  Laterality: N/A;   PEG PLACEMENT N/A 04/13/2017   Procedure: PERCUTANEOUS ENDOSCOPIC GASTROSTOMY (PEG) PLACEMENT;  Surgeon: Georganna Skeans, MD;  Location: [facility] ENDOSCOPY;  Service: General;  Laterality: N/A;    Social History   Socioeconomic History   Marital status: Unknown    Spouse name: Not on file   Number of children: Not on file   Years of education: Not on file   Highest education level: Not on file  Occupational History   Not on file  Tobacco Use   Smoking status: Former    Types: Cigarettes   Smokeless tobacco: Never   Tobacco comments:    UTA  Vaping Use   Vaping Use: Never used  Substance and Sexual Activity   Alcohol use: No    Comment: UTA   Drug use: No    Comment: UTA   Sexual activity: Not Currently    Birth control/protection: None  Other Topics Concern   Not on file  Social History Narrative   Not on file   Social Determinants of Health   Financial Resource Strain: Not on file  Food Insecurity: Not on file  Transportation Needs: Not on file  Physical Activity: Not on file  Stress: Not on file  Social Connections: Not on file  Intimate Partner Violence: Not on file   Family History  Problem Relation Age of Onset   Hypertension Father       VITAL SIGNS BP 106/64   Pulse 82   Temp (!) 96.7 F (35.9 C)   Ht '5\' 6"'$  (1.676 m)   Wt 172 lb (78 kg)   BMI 27.76 kg/m   Outpatient Encounter Medications  as of 02/09/2022  Medication Sig   acetaminophen (TYLENOL) 325 MG tablet Take 650 mg by mouth every 4 (four) hours as needed. Not to exceed 3 grams in 24 hours   apixaban (ELIQUIS) 5 MG TABS tablet Take 5 mg by mouth 2 (two) times daily.    atorvastatin (LIPITOR) 20 MG tablet Take 20 mg by mouth daily.   bisacodyl (DULCOLAX) 10 MG suppository Place 1 suppository (10 mg total) rectally daily as needed for moderate constipation.   Cholecalciferol 1.25 MG (50000 UT) capsule Take 50,000 Units by mouth once a week. For Low Vitamin D Level on Monday   Dextromethorphan-guaiFENesin (ROBITUSSIN COUGH+CHEST CONG DM PO) Take 10 mLs by mouth every 6 (six) hours as needed.   docusate (COLACE)  50 MG/5ML liquid Give 10 ml by mouth twice a day   Ensure Max Protein (ENSURE MAX PROTEIN) LIQD daily to support protein repletion   gabapentin (NEURONTIN) 100 MG capsule Take 100 mg by mouth at bedtime.   levETIRAcetam (KEPPRA XR) 500 MG 24 hr tablet Take 500 mg by mouth daily. At bedtime 8 pm   LORazepam (ATIVAN) 2 MG/ML injection Inject 0.5 mLs (1 mg total) into the muscle every 15 (fifteen) minutes as needed. Up to 3 doses for uncontrolled seizure activity   Menthol, Topical Analgesic, (BIOFREEZE) 4 % GEL Apply 1 application topically 2 (two) times daily. Apply to left shoulder   NON FORMULARY Diet:Regular   omeprazole (PRILOSEC) 40 MG capsule Take 40 mg by mouth daily.    QUEtiapine (SEROQUEL) 100 MG tablet Take 100 mg by mouth 2 (two) times daily.   [DISCONTINUED] Dulaglutide (TRULICITY) 5.85 ID/7.8EU SOPN Inject 0.75 mg into the skin. Once a day on Wednesday   [DISCONTINUED] levETIRAcetam (KEPPRA) 100 MG/ML solution Take 500 mg by mouth 2 (two) times daily. For Seizures Mix in Grape Juice   No facility-administered encounter medications on file as of 02/09/2022.     SIGNIFICANT DIAGNOSTIC EXAMS   PREVIOUS   06-10-21: thyroid biopsy   NO NEW EXAMS.   LABS REVIEWED PREVIOUS:      05-03-21: hgb a1c 4.9; chol 117; ldl 64; trig 125; hdl 28; mag 2.0; vit D 38.55 micro albumin 92.8 08-06-21: wbc 10.0; hgb 10.8 hct 37.8; mcv 76.8 plt 249; glucose 118; bun 11; creat 0.77; k+ 3.1; na++ 136; ca 8.5 GFR>60 08-13-21: wbc 11.9; hgb 10.3; hct 36.2; mcv 77.4 plt 251; glucose 69; bun 8; creat 0.69; k+ 3.2; na++ 140; ca 8.3 GFR >60 10-21-21: glucose 133; bun 9; creat 0.67; k+ 4.0; na++ 139; ca 8.7; protein 6.1; albumin 2.6; hgb a1c 5.7; chol 99; ldl 49; trig 104; hdl 29   NO NEW LABS.  Review of Systems  Constitutional:  Negative for malaise/fatigue.  Respiratory:  Negative for cough and shortness of breath.   Cardiovascular:  Negative for chest pain, palpitations and leg swelling.   Gastrointestinal:  Negative for abdominal pain, constipation and heartburn.  Musculoskeletal:  Negative for back pain, joint pain and myalgias.  Skin: Negative.   Neurological:  Negative for dizziness.  Psychiatric/Behavioral:  The patient is not nervous/anxious.     Physical Exam Constitutional:      General: He is not in acute distress.    Appearance: He is well-developed. He is not diaphoretic.  Neck:     Thyroid: Thyroid mass and thyromegaly present.  Cardiovascular:     Rate and Rhythm: Normal rate and regular rhythm.     Pulses: Normal pulses.     Heart  sounds: Normal heart sounds.  Pulmonary:     Effort: Pulmonary effort is normal. No respiratory distress.     Breath sounds: Normal breath sounds.  Abdominal:     General: Bowel sounds are normal. There is no distension.     Palpations: Abdomen is soft.     Tenderness: There is no abdominal tenderness.  Musculoskeletal:     Cervical back: Neck supple.     Right lower leg: No edema.     Left lower leg: No edema.     Comments: Hemiplegia   Lymphadenopathy:     Cervical: No cervical adenopathy.  Skin:    General: Skin is warm and dry.  Neurological:     Mental Status: He is alert. Mental status is at baseline.  Psychiatric:        Mood and Affect: Mood normal.      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    Ok Edwards NP [facility]   call [phone]

## 2022-02-10 ENCOUNTER — Other Ambulatory Visit (HOSPITAL_COMMUNITY)
Admission: RE | Admit: 2022-02-10 | Discharge: 2022-02-10 | Disposition: A | Discharge status: Home / Self Care | Payer: Medicaid Other | Source: Skilled Nursing Facility | Attending: Adult Health | Admitting: Adult Health

## 2022-02-10 DIAGNOSIS — E559 Vitamin D deficiency, unspecified: Secondary | ICD-10-CM | POA: Diagnosis present

## 2022-02-10 LAB — VITAMIN D 25 HYDROXY (VIT D DEFICIENCY, FRACTURES): Vit D, 25-Hydroxy: 43.68 ng/mL (ref 30–100)

## 2022-02-23 ENCOUNTER — Encounter: Payer: Self-pay | Admitting: Adult Health

## 2022-02-23 ENCOUNTER — Non-Acute Institutional Stay (SKILLED_NURSING_FACILITY): Payer: Medicaid Other | Admitting: Adult Health

## 2022-02-23 DIAGNOSIS — E042 Nontoxic multinodular goiter: Secondary | ICD-10-CM

## 2022-02-23 DIAGNOSIS — I152 Hypertension secondary to endocrine disorders: Secondary | ICD-10-CM

## 2022-02-23 DIAGNOSIS — F22 Delusional disorders: Secondary | ICD-10-CM

## 2022-02-23 DIAGNOSIS — K219 Gastro-esophageal reflux disease without esophagitis: Secondary | ICD-10-CM

## 2022-02-23 DIAGNOSIS — E1149 Type 2 diabetes mellitus with other diabetic neurological complication: Secondary | ICD-10-CM | POA: Diagnosis not present

## 2022-02-23 DIAGNOSIS — E1159 Type 2 diabetes mellitus with other circulatory complications: Secondary | ICD-10-CM

## 2022-02-23 NOTE — Progress Notes (Signed)
Location:  [place] Room Number: 113 Place of Service:  SNF (31)   CODE STATUS: DNR  Allergies  Allergen Reactions   Cheese    Penicillins Hives    Has patient had a PCN reaction causing immediate rash, facial/tongue/throat swelling, SOB or lightheadedness with hypotension: Unknown Has patient had a PCN reaction causing severe rash involving mucus membranes or skin necrosis: Unknown Has patient had a PCN reaction that required hospitalization: No Has patient had a PCN reaction occurring within the last 10 years: No If all of the above answers are "NO", then may proceed with Cephalosporin use.     Chief Complaint  Patient presents with   Medical Management of Chronic Issues                                GERD without esophagitis:    Multinodular goiter:  Type 2 diabetes mellitus with neurological complications:   Hypertension associated with type 2 diabetes mellitus:  Delusions/paranoia     HPI:  He is a 61 year old long term resident of this facility being seen for the management of his chronic illnesses: GERD without esophagitis:    Multinodular goiter:  Type 2 diabetes mellitus with neurological complications:   Hypertension associated with type 2 diabetes mellitus:  Delusions/paranoia. There are no reports of uncontrolled pain; no further seizure activity. No reports of declining medications. No reports of recent delusional thoughts.   Past Medical History:  Diagnosis Date   DM (diabetes mellitus) ([place])    History of seizures    Hypertension    Stroke [facility])     Past Surgical History:  Procedure Laterality Date   CRANIECTOMY Right 04/01/2017   Procedure: RIGHT DECOMPRESSIVE CRANIECTOMY;  Surgeon: Ditty, Kevan Ny, MD;  Location: [place];  Service: Neurosurgery;  Laterality: Right;   ESOPHAGOGASTRODUODENOSCOPY N/A 04/13/2017   Procedure: ESOPHAGOGASTRODUODENOSCOPY (EGD);  Surgeon: Georganna Skeans, MD;  Location: [place];  Service: General;   Laterality: N/A;  bedside   INCISION AND DRAINAGE PERIRECTAL ABSCESS N/A 09/28/2017   Procedure: IRRIGATION AND DEBRIDEMENT PERIANAL ABSCESS;  Surgeon: Virl Cagey, MD;  Location: [facility];  Service: General;  Laterality: N/A;   PEG PLACEMENT N/A 04/13/2017   Procedure: PERCUTANEOUS ENDOSCOPIC GASTROSTOMY (PEG) PLACEMENT;  Surgeon: Georganna Skeans, MD;  Location: [facility] ENDOSCOPY;  Service: General;  Laterality: N/A;    Social History   Socioeconomic History   Marital status: Unknown    Spouse name: Not on file   Number of children: Not on file   Years of education: Not on file   Highest education level: Not on file  Occupational History   Not on file  Tobacco Use   Smoking status: Former    Types: Cigarettes   Smokeless tobacco: Never   Tobacco comments:    UTA  Vaping Use   Vaping Use: Never used  Substance and Sexual Activity   Alcohol use: No    Comment: UTA   Drug use: No    Comment: UTA   Sexual activity: Not Currently    Birth control/protection: None  Other Topics Concern   Not on file  Social History Narrative   Not on file   Social Determinants of Health   Financial Resource Strain: Not on file  Food Insecurity: Not on file  Transportation Needs: Not on file  Physical Activity: Not on file  Stress: Not on file  Social Connections: Not  on file  Intimate Partner Violence: Not on file   Family History  Problem Relation Age of Onset   Hypertension Father       VITAL SIGNS BP (!) 100/54   Pulse 63   Temp (!) 97.4 F (36.3 C)   Resp 20   Ht '5\' 6"'$  (1.676 m)   Wt 172 lb 3.2 oz (78.1 kg)   SpO2 96%   BMI 27.79 kg/m   Outpatient Encounter Medications as of 02/23/2022  Medication Sig   acetaminophen (TYLENOL) 325 MG tablet Take 650 mg by mouth every 4 (four) hours as needed. Not to exceed 3 grams in 24 hours   apixaban (ELIQUIS) 5 MG TABS tablet Take 5 mg by mouth 2 (two) times daily.    atorvastatin (LIPITOR) 20 MG tablet Take 20 mg by mouth daily.    bisacodyl (DULCOLAX) 10 MG suppository Place 1 suppository (10 mg total) rectally daily as needed for moderate constipation.   Cholecalciferol 1.25 MG (50000 UT) capsule Take 50,000 Units by mouth once a week. For Low Vitamin D Level on Monday   Dextromethorphan-guaiFENesin (ROBITUSSIN COUGH+CHEST CONG DM PO) Take 10 mLs by mouth every 6 (six) hours as needed.   docusate (COLACE) 50 MG/5ML liquid Give 10 ml by mouth twice a day   Ensure Max Protein (ENSURE MAX PROTEIN) LIQD daily to support protein repletion   gabapentin (NEURONTIN) 100 MG capsule Take 100 mg by mouth at bedtime.   levETIRAcetam (KEPPRA XR) 500 MG 24 hr tablet Take 500 mg by mouth daily. At bedtime 8 pm   LORazepam (ATIVAN) 2 MG/ML injection Inject 0.5 mLs (1 mg total) into the muscle every 15 (fifteen) minutes as needed. Up to 3 doses for uncontrolled seizure activity   Menthol, Topical Analgesic, (BIOFREEZE) 4 % GEL Apply 1 application topically 2 (two) times daily. Apply to left shoulder   NON FORMULARY Diet:Regular   omeprazole (PRILOSEC) 40 MG capsule Take 40 mg by mouth daily.    QUEtiapine (SEROQUEL) 100 MG tablet Take 100 mg by mouth 2 (two) times daily.   sitaGLIPtin (JANUVIA) 25 MG tablet Take 25 mg by mouth daily.   No facility-administered encounter medications on file as of 02/23/2022.     SIGNIFICANT DIAGNOSTIC EXAMS  PREVIOUS   06-10-21: thyroid biopsy   NO NEW EXAMS.   LABS REVIEWED PREVIOUS:     05-03-21: hgb a1c 4.9; chol 117; ldl 64; trig 125; hdl 28; mag 2.0; vit D 38.55 micro albumin 92.8 08-06-21: wbc 10.0; hgb 10.8 hct 37.8; mcv 76.8 plt 249; glucose 118; bun 11; creat 0.77; k+ 3.1; na++ 136; ca 8.5 GFR>60 08-13-21: wbc 11.9; hgb 10.3; hct 36.2; mcv 77.4 plt 251; glucose 69; bun 8; creat 0.69; k+ 3.2; na++ 140; ca 8.3 GFR >60 10-21-21: glucose 133; bun 9; creat 0.67; k+ 4.0; na++ 139; ca 8.7; protein 6.1; albumin 2.6; hgb a1c 5.7; chol 99; ldl 49; trig 104; hdl 29  TODAY  01-06-22: PSA  2.14 01-19-22: wbc 10.8; hgb 11.3; hct 38.7; mcv 75.6 plt 214; glucose 251; bun 11; creat 0.69; k+ 3.6; na++ 135; ca 8.6; gfr >60; protein 6.8; albumin 2.9 keppra 13.2 lactic acid 0.9 tsh 1.318 02-10-22: vitamin D 43.68     Review of Systems  Constitutional:  Negative for malaise/fatigue.  Respiratory:  Negative for cough and shortness of breath.   Cardiovascular:  Negative for chest pain, palpitations and leg swelling.  Gastrointestinal:  Negative for abdominal pain, constipation and heartburn.  Musculoskeletal:  Negative  for back pain, joint pain and myalgias.  Skin: Negative.   Neurological:  Negative for dizziness.  Psychiatric/Behavioral:  The patient is not nervous/anxious.    Physical Exam Constitutional:      General: He is not in acute distress.    Appearance: He is well-developed. He is not diaphoretic.  Neck:     Thyroid: Thyroid mass and thyromegaly present.  Cardiovascular:     Rate and Rhythm: Normal rate and regular rhythm.     Pulses: Normal pulses.     Heart sounds: Normal heart sounds.  Pulmonary:     Effort: Pulmonary effort is normal. No respiratory distress.     Breath sounds: Normal breath sounds.  Abdominal:     General: Bowel sounds are normal. There is no distension.     Palpations: Abdomen is soft.     Tenderness: There is no abdominal tenderness.  Musculoskeletal:     Cervical back: Neck supple.     Right lower leg: No edema.     Left lower leg: No edema.     Comments: Left Hemiplegia   Lymphadenopathy:     Cervical: No cervical adenopathy.  Skin:    General: Skin is warm and dry.  Neurological:     Mental Status: He is alert. Mental status is at baseline.  Psychiatric:        Mood and Affect: Mood normal.        ASSESSMENT/ PLAN:  TODAY;   GERD without esophagitis: will continue prilosec 40 mg daily   2. Multinodular goiter: is status post aspiration   3. Type 2 diabetes mellitus with neurological complications: hgb B0F 4.9 will  monitor   4. Hypertension associated with type 2 diabetes mellitus: b/p 100/54 will monitor is off lopressor  5. 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 AF (paroxsymal atrial fibrillation) heart rate is stable will continue lopressor 12.5 mg twice daily for rate control and eliquis 5 mg twice daily  7. Hypochromic anemia: is stable hgb 11.9; will monitor   8. Protein calorie malnutrition severe: albumin 2.6 will continue prostat with meals  9. Dyslipidemia associated with type 2 diabetes mellitus: is stable ldl 49 will continue lipitor  20 mg daily   10. Diabetic peripheral neuropathy: will continue gabapentin 100 mg nightly   11. Seizure: without recent activity will continue keppra 500 mg twice daily   12. Chronic constipation: will continue colace twice daily   13. History of CVA; due to embolism of right middle cerebral artery: is s/p compression right frontotemporoparteital craniotomy with duraplasty 2018. Will continue eliquis 5 mg twice daily   14. Vitamin D deficiency: is 38.55; will continue 50, 000 units weekly   15. Hemiplegia affecting left side as late effect of cerebrovascular accident (CVA) : will continue baclofen 5 mg three times daily     Ok Edwards NP [facility]  call [phone]

## 2022-03-14 ENCOUNTER — Other Ambulatory Visit (HOSPITAL_COMMUNITY)
Admission: RE | Admit: 2022-03-14 | Discharge: 2022-03-14 | Disposition: A | Discharge status: Home / Self Care | Payer: Medicaid Other | Source: Skilled Nursing Facility | Attending: Adult Health | Admitting: Adult Health

## 2022-03-14 ENCOUNTER — Encounter: Payer: Self-pay | Admitting: Adult Health

## 2022-03-14 ENCOUNTER — Non-Acute Institutional Stay (SKILLED_NURSING_FACILITY): Payer: Medicaid Other | Admitting: Adult Health

## 2022-03-14 DIAGNOSIS — E1149 Type 2 diabetes mellitus with other diabetic neurological complication: Secondary | ICD-10-CM

## 2022-03-14 LAB — HEMOGLOBIN A1C
Hgb A1c MFr Bld: 7.5 % — ABNORMAL HIGH (ref 4.8–5.6)
Mean Plasma Glucose: 168.55 mg/dL

## 2022-03-14 NOTE — Progress Notes (Unsigned)
Location:  [place] Room Number: 113-D Place of Service:  SNF (31)   CODE STATUS: DNR  Allergies  Allergen Reactions   Cheese    Penicillins Hives    Has patient had a PCN reaction causing immediate rash, facial/tongue/throat swelling, SOB or lightheadedness with hypotension: Unknown Has patient had a PCN reaction causing severe rash involving mucus membranes or skin necrosis: Unknown Has patient had a PCN reaction that required hospitalization: No Has patient had a PCN reaction occurring within the last 10 years: No If all of the above answers are "NO", then may proceed with Cephalosporin use.     Chief Complaint  Patient presents with   Acute Visit    Diabetes    HPI:  He is a 61 year old man being seen for the management of his diabetes. He does not and will not take injections. He is presently taking januvia 25 mg daily.  The majority of his cbg readings are in the 200 range. He denies any excessive hunger or thirst. There are no reports of hypoglycemia. His hgb a1c today is 7.5 with a goal of <7.    Past Medical History:  Diagnosis Date   DM (diabetes mellitus) ([place])    History of seizures    Hypertension    Stroke [facility])     Past Surgical History:  Procedure Laterality Date   CRANIECTOMY Right 04/01/2017   Procedure: RIGHT DECOMPRESSIVE CRANIECTOMY;  Surgeon: Ditty, Kevan Ny, MD;  Location: [place];  Service: Neurosurgery;  Laterality: Right;   ESOPHAGOGASTRODUODENOSCOPY N/A 04/13/2017   Procedure: ESOPHAGOGASTRODUODENOSCOPY (EGD);  Surgeon: Georganna Skeans, MD;  Location: [place];  Service: General;  Laterality: N/A;  bedside   INCISION AND DRAINAGE PERIRECTAL ABSCESS N/A 09/28/2017   Procedure: IRRIGATION AND DEBRIDEMENT PERIANAL ABSCESS;  Surgeon: Virl Cagey, MD;  Location: [facility];  Service: General;  Laterality: N/A;   PEG PLACEMENT N/A 04/13/2017   Procedure: PERCUTANEOUS ENDOSCOPIC GASTROSTOMY (PEG) PLACEMENT;  Surgeon:  Georganna Skeans, MD;  Location: [facility] ENDOSCOPY;  Service: General;  Laterality: N/A;    Social History   Socioeconomic History   Marital status: Unknown    Spouse name: Not on file   Number of children: Not on file   Years of education: Not on file   Highest education level: Not on file  Occupational History   Not on file  Tobacco Use   Smoking status: Former    Types: Cigarettes   Smokeless tobacco: Never   Tobacco comments:    UTA  Vaping Use   Vaping Use: Never used  Substance and Sexual Activity   Alcohol use: No    Comment: UTA   Drug use: No    Comment: UTA   Sexual activity: Not Currently    Birth control/protection: None  Other Topics Concern   Not on file  Social History Narrative   Not on file   Social Determinants of Health   Financial Resource Strain: Not on file  Food Insecurity: Not on file  Transportation Needs: Not on file  Physical Activity: Not on file  Stress: Not on file  Social Connections: Not on file  Intimate Partner Violence: Not on file   Family History  Problem Relation Age of Onset   Hypertension Father       VITAL SIGNS BP 120/68   Pulse 74   Temp 98.6 F (37 C)   Resp (!) 22   Ht '5\' 6"'$  (1.676 m)   Wt 174  lb (78.9 kg)   SpO2 97%   BMI 28.08 kg/m   Outpatient Encounter Medications as of 03/14/2022  Medication Sig   acetaminophen (TYLENOL) 325 MG tablet Take 650 mg by mouth every 4 (four) hours as needed. Not to exceed 3 grams in 24 hours   apixaban (ELIQUIS) 5 MG TABS tablet Take 5 mg by mouth 2 (two) times daily.    bisacodyl (DULCOLAX) 10 MG suppository Place 1 suppository (10 mg total) rectally daily as needed for moderate constipation.   Cholecalciferol 1.25 MG (50000 UT) capsule Take 50,000 Units by mouth once a week. For Low Vitamin D Level on Monday   Dextromethorphan-guaiFENesin (ROBITUSSIN COUGH+CHEST CONG DM PO) Take 10 mLs by mouth every 6 (six) hours as needed.   docusate (COLACE) 50 MG/5ML liquid Give 10 ml by  mouth twice a day   Ensure Max Protein (ENSURE MAX PROTEIN) LIQD daily to support protein repletion   gabapentin (NEURONTIN) 100 MG capsule Take 100 mg by mouth at bedtime.   levETIRAcetam (KEPPRA XR) 500 MG 24 hr tablet Take 500 mg by mouth daily. At bedtime 8 pm   LORazepam (ATIVAN) 2 MG/ML injection Inject 0.5 mLs (1 mg total) into the muscle every 15 (fifteen) minutes as needed. Up to 3 doses for uncontrolled seizure activity   Menthol, Topical Analgesic, (BIOFREEZE) 4 % GEL Apply 1 application topically 2 (two) times daily. Apply to left shoulder   NON FORMULARY Diet:Regular   omeprazole (PRILOSEC) 40 MG capsule Take 40 mg by mouth daily.    QUEtiapine (SEROQUEL) 100 MG tablet Take 100 mg by mouth 2 (two) times daily.   sitaGLIPtin (JANUVIA) 25 MG tablet Take 25 mg by mouth daily.   [DISCONTINUED] atorvastatin (LIPITOR) 20 MG tablet Take 20 mg by mouth daily.   No facility-administered encounter medications on file as of 03/14/2022.     SIGNIFICANT DIAGNOSTIC EXAMS   PREVIOUS   06-10-21: thyroid biopsy   NO NEW EXAMS.   LABS REVIEWED PREVIOUS:     05-03-21: hgb a1c 4.9; chol 117; ldl 64; trig 125; hdl 28; mag 2.0; vit D 38.55 micro albumin 92.8 08-06-21: wbc 10.0; hgb 10.8 hct 37.8; mcv 76.8 plt 249; glucose 118; bun 11; creat 0.77; k+ 3.1; na++ 136; ca 8.5 GFR>60 08-13-21: wbc 11.9; hgb 10.3; hct 36.2; mcv 77.4 plt 251; glucose 69; bun 8; creat 0.69; k+ 3.2; na++ 140; ca 8.3 GFR >60 10-21-21: glucose 133; bun 9; creat 0.67; k+ 4.0; na++ 139; ca 8.7; protein 6.1; albumin 2.6; hgb a1c 5.7; chol 99; ldl 49; trig 104; hdl 29 01-06-22: PSA 2.14 01-19-22: wbc 10.8; hgb 11.3; hct 38.7; mcv 75.6 plt 214; glucose 251; bun 11; creat 0.69; k+ 3.6; na++ 135; ca 8.6; gfr >60; protein 6.8; albumin 2.9 keppra 13.2 lactic acid 0.9 tsh 1.318 02-10-22: vitamin D 43.68   TODAY  03-14-22; hgb a1c 7.5   Review of Systems  Constitutional:  Negative for malaise/fatigue.  Respiratory:  Negative for cough  and shortness of breath.   Cardiovascular:  Negative for chest pain, palpitations and leg swelling.  Gastrointestinal:  Negative for abdominal pain, constipation and heartburn.  Musculoskeletal:  Negative for back pain, joint pain and myalgias.  Skin: Negative.   Neurological:  Negative for dizziness.  Psychiatric/Behavioral:  The patient is not nervous/anxious.    Physical Exam Constitutional:      General: He is not in acute distress.    Appearance: He is well-developed. He is not diaphoretic.  Neck:  Thyroid: Thyroid mass and thyromegaly present.  Cardiovascular:     Rate and Rhythm: Normal rate and regular rhythm.     Pulses: Normal pulses.     Heart sounds: Normal heart sounds.  Pulmonary:     Effort: Pulmonary effort is normal. No respiratory distress.     Breath sounds: Normal breath sounds.  Abdominal:     General: Bowel sounds are normal. There is no distension.     Palpations: Abdomen is soft.     Tenderness: There is no abdominal tenderness.  Musculoskeletal:     Cervical back: Neck supple.     Right lower leg: No edema.     Left lower leg: No edema.     Comments:  Left Hemiplegia    Lymphadenopathy:     Cervical: No cervical adenopathy.  Skin:    General: Skin is warm and dry.  Neurological:     Mental Status: He is alert. Mental status is at baseline.  Psychiatric:        Mood and Affect: Mood normal.       ASSESSMENT/ PLAN:  TODAY  Type 2 diabetes mellitus with neurological complications: hgb Z2Y is 7.5 he is off statin due to  his ldl being 49. Will increase januvia to 50 mg daily and will repeat BMP on 03-28-22.       Ok Edwards NP [facility]   call [phone]

## 2022-03-28 ENCOUNTER — Encounter: Payer: Self-pay | Admitting: Adult Health

## 2022-03-28 ENCOUNTER — Encounter (HOSPITAL_COMMUNITY)
Admission: RE | Admit: 2022-03-28 | Discharge: 2022-03-28 | Disposition: A | Discharge status: Home / Self Care | Payer: Medicaid Other | Source: Skilled Nursing Facility | Attending: Adult Health | Admitting: Adult Health

## 2022-03-28 ENCOUNTER — Non-Acute Institutional Stay (SKILLED_NURSING_FACILITY): Payer: Medicaid Other | Admitting: Adult Health

## 2022-03-28 DIAGNOSIS — E1149 Type 2 diabetes mellitus with other diabetic neurological complication: Secondary | ICD-10-CM | POA: Insufficient documentation

## 2022-03-28 LAB — BASIC METABOLIC PANEL
Anion gap: 6 (ref 5–15)
BUN: 7 mg/dL (ref 6–20)
CO2: 24 mmol/L (ref 22–32)
Calcium: 8.9 mg/dL (ref 8.9–10.3)
Chloride: 107 mmol/L (ref 98–111)
Creatinine, Ser: 0.61 mg/dL (ref 0.61–1.24)
GFR, Estimated: >60 mL/min (ref >60)
Glucose, Bld: 336 mg/dL — ABNORMAL HIGH (ref 70–99)
Potassium: 3.6 mmol/L (ref 3.5–5.1)
Sodium: 137 mmol/L (ref 135–145)

## 2022-03-28 NOTE — Progress Notes (Signed)
Location:  [place] Room Number: 113 Place of Service:   SNF    CODE STATUS: dnr   Allergies  Allergen Reactions   Cheese    Penicillins Hives    Has patient had a PCN reaction causing immediate rash, facial/tongue/throat swelling, SOB or lightheadedness with hypotension: Unknown Has patient had a PCN reaction causing severe rash involving mucus membranes or skin necrosis: Unknown Has patient had a PCN reaction that required hospitalization: No Has patient had a PCN reaction occurring within the last 10 years: No If all of the above answers are "NO", then may proceed with Cephalosporin use.     Chief Complaint  Patient presents with   Acute Visit    Follow up labs.     HPI:  His hgb a1c is 7.5. his cbg readings are upper 200's through upper 300's. He is presently taking januvia 50 mg daily. After reviewing his medications; he may benefit from rybelsus. He does not want to take injections. We will try to avoid insulin if at all possible.   Past Medical History:  Diagnosis Date   DM (diabetes mellitus) ([place])    History of seizures    Hypertension    Stroke [facility])     Past Surgical History:  Procedure Laterality Date   CRANIECTOMY Right 04/01/2017   Procedure: RIGHT DECOMPRESSIVE CRANIECTOMY;  Surgeon: Ditty, Kevan Ny, MD;  Location: [place];  Service: Neurosurgery;  Laterality: Right;   ESOPHAGOGASTRODUODENOSCOPY N/A 04/13/2017   Procedure: ESOPHAGOGASTRODUODENOSCOPY (EGD);  Surgeon: Georganna Skeans, MD;  Location: [place];  Service: General;  Laterality: N/A;  bedside   INCISION AND DRAINAGE PERIRECTAL ABSCESS N/A 09/28/2017   Procedure: IRRIGATION AND DEBRIDEMENT PERIANAL ABSCESS;  Surgeon: Virl Cagey, MD;  Location: [facility];  Service: General;  Laterality: N/A;   PEG PLACEMENT N/A 04/13/2017   Procedure: PERCUTANEOUS ENDOSCOPIC GASTROSTOMY (PEG) PLACEMENT;  Surgeon: Georganna Skeans, MD;  Location: [facility] ENDOSCOPY;  Service: General;   Laterality: N/A;    Social History   Socioeconomic History   Marital status: Unknown    Spouse name: Not on file   Number of children: Not on file   Years of education: Not on file   Highest education level: Not on file  Occupational History   Not on file  Tobacco Use   Smoking status: Former    Types: Cigarettes   Smokeless tobacco: Never   Tobacco comments:    UTA  Vaping Use   Vaping Use: Never used  Substance and Sexual Activity   Alcohol use: No    Comment: UTA   Drug use: No    Comment: UTA   Sexual activity: Not Currently    Birth control/protection: None  Other Topics Concern   Not on file  Social History Narrative   Not on file   Social Determinants of Health   Financial Resource Strain: Not on file  Food Insecurity: Not on file  Transportation Needs: Not on file  Physical Activity: Not on file  Stress: Not on file  Social Connections: Not on file  Intimate Partner Violence: Not on file   Family History  Problem Relation Age of Onset   Hypertension Father       VITAL SIGNS BP 126/72   Pulse 75   Temp 98.6 F (37 C)   Ht '5\' 6"'$  (1.676 m)   Wt 174 lb (78.9 kg)   BMI 28.08 kg/m   Outpatient Encounter Medications as of 03/28/2022  Medication Sig  acetaminophen (TYLENOL) 325 MG tablet Take 650 mg by mouth every 4 (four) hours as needed. Not to exceed 3 grams in 24 hours   apixaban (ELIQUIS) 5 MG TABS tablet Take 5 mg by mouth 2 (two) times daily.    bisacodyl (DULCOLAX) 10 MG suppository Place 1 suppository (10 mg total) rectally daily as needed for moderate constipation.   Cholecalciferol 1.25 MG (50000 UT) capsule Take 50,000 Units by mouth once a week. For Low Vitamin D Level on Monday   Dextromethorphan-guaiFENesin (ROBITUSSIN COUGH+CHEST CONG DM PO) Take 10 mLs by mouth every 6 (six) hours as needed.   docusate (COLACE) 50 MG/5ML liquid Give 10 ml by mouth twice a day   Ensure Max Protein (ENSURE MAX PROTEIN) LIQD daily to support protein  repletion   gabapentin (NEURONTIN) 100 MG capsule Take 100 mg by mouth at bedtime.   levETIRAcetam (KEPPRA XR) 500 MG 24 hr tablet Take 500 mg by mouth daily. At bedtime 8 pm   LORazepam (ATIVAN) 2 MG/ML injection Inject 0.5 mLs (1 mg total) into the muscle every 15 (fifteen) minutes as needed. Up to 3 doses for uncontrolled seizure activity   Menthol, Topical Analgesic, (BIOFREEZE) 4 % GEL Apply 1 application topically 2 (two) times daily. Apply to left shoulder   NON FORMULARY Diet:Regular   omeprazole (PRILOSEC) 40 MG capsule Take 40 mg by mouth daily.    QUEtiapine (SEROQUEL) 100 MG tablet Take 100 mg by mouth 2 (two) times daily.   sitaGLIPtin (JANUVIA) 25 MG tablet Take 25 mg by mouth daily.   No facility-administered encounter medications on file as of 03/28/2022.     SIGNIFICANT DIAGNOSTIC EXAMS   PREVIOUS   06-10-21: thyroid biopsy   NO NEW EXAMS.   LABS REVIEWED PREVIOUS:     05-03-21: hgb a1c 4.9; chol 117; ldl 64; trig 125; hdl 28; mag 2.0; vit D 38.55 micro albumin 92.8 08-06-21: wbc 10.0; hgb 10.8 hct 37.8; mcv 76.8 plt 249; glucose 118; bun 11; creat 0.77; k+ 3.1; na++ 136; ca 8.5 GFR>60 08-13-21: wbc 11.9; hgb 10.3; hct 36.2; mcv 77.4 plt 251; glucose 69; bun 8; creat 0.69; k+ 3.2; na++ 140; ca 8.3 GFR >60 10-21-21: glucose 133; bun 9; creat 0.67; k+ 4.0; na++ 139; ca 8.7; protein 6.1; albumin 2.6; hgb a1c 5.7; chol 99; ldl 49; trig 104; hdl 29 01-06-22: PSA 2.14 01-19-22: wbc 10.8; hgb 11.3; hct 38.7; mcv 75.6 plt 214; glucose 251; bun 11; creat 0.69; k+ 3.6; na++ 135; ca 8.6; gfr >60; protein 6.8; albumin 2.9 keppra 13.2 lactic acid 0.9 tsh 1.318 02-10-22: vitamin D 43.68  03-14-22; hgb a1c 7.5   TODAY  03-28-22: glucose 336; bun 7; creat 0.61; k+ 3.6; na++ 137; ca 8.9; gfr>60  Review of Systems  Constitutional:  Negative for malaise/fatigue.  Respiratory:  Negative for cough and shortness of breath.   Cardiovascular:  Negative for chest pain, palpitations and leg  swelling.  Gastrointestinal:  Negative for abdominal pain, constipation and heartburn.  Musculoskeletal:  Negative for back pain, joint pain and myalgias.  Skin: Negative.   Neurological:  Negative for dizziness.  Psychiatric/Behavioral:  The patient is not nervous/anxious.    Physical Exam Constitutional:      General: He is not in acute distress.    Appearance: He is well-developed. He is not diaphoretic.  Neck:     Thyroid: Thyroid mass and thyromegaly present.  Cardiovascular:     Rate and Rhythm: Normal rate and regular rhythm.  Pulses: Normal pulses.     Heart sounds: Normal heart sounds.  Pulmonary:     Effort: Pulmonary effort is normal. No respiratory distress.     Breath sounds: Normal breath sounds.  Abdominal:     General: Bowel sounds are normal. There is no distension.     Palpations: Abdomen is soft.     Tenderness: There is no abdominal tenderness.  Musculoskeletal:     Cervical back: Neck supple.     Right lower leg: No edema.     Left lower leg: No edema.     Comments: Left hemiplegia   Lymphadenopathy:     Cervical: No cervical adenopathy.  Skin:    General: Skin is warm and dry.  Neurological:     Mental Status: He is alert. Mental status is at baseline.  Psychiatric:        Mood and Affect: Mood normal.       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 Will try to avoid insulin if at all possible.     Ok Edwards NP [facility]   call [phone]

## 2022-03-30 ENCOUNTER — Encounter: Payer: Self-pay | Admitting: Adult Health

## 2022-03-30 ENCOUNTER — Non-Acute Institutional Stay (SKILLED_NURSING_FACILITY): Payer: Medicaid Other | Admitting: Adult Health

## 2022-03-30 DIAGNOSIS — D509 Iron deficiency anemia, unspecified: Secondary | ICD-10-CM | POA: Diagnosis not present

## 2022-03-30 DIAGNOSIS — E1169 Type 2 diabetes mellitus with other specified complication: Secondary | ICD-10-CM

## 2022-03-30 DIAGNOSIS — E43 Unspecified severe protein-calorie malnutrition: Secondary | ICD-10-CM | POA: Diagnosis not present

## 2022-03-30 DIAGNOSIS — F482 Pseudobulbar affect: Secondary | ICD-10-CM

## 2022-03-30 DIAGNOSIS — I480 Paroxysmal atrial fibrillation: Secondary | ICD-10-CM | POA: Diagnosis not present

## 2022-03-30 DIAGNOSIS — E785 Hyperlipidemia, unspecified: Secondary | ICD-10-CM

## 2022-03-30 NOTE — Progress Notes (Signed)
Location:  [place] Room Number: 113-D Place of Service:  SNF (31)   CODE STATUS: DNR  Allergies  Allergen Reactions   Cheese    Penicillins Hives    Has patient had a PCN reaction causing immediate rash, facial/tongue/throat swelling, SOB or lightheadedness with hypotension: Unknown Has patient had a PCN reaction causing severe rash involving mucus membranes or skin necrosis: Unknown Has patient had a PCN reaction that required hospitalization: No Has patient had a PCN reaction occurring within the last 10 years: No If all of the above answers are "NO", then may proceed with Cephalosporin use.     Chief Complaint  Patient presents with   Medical Management of Chronic Issues                              AF (paroxsymal atrial fibrillation)    Hypochromic anemia:  Protein calorie malnutrition severe   Dyslipidemia associated with type 2 diabetes mellitus    HPI:  He is a 61 year old long term resident of this facility being seen for the management of his chronic illnesses:   AF (paroxsymal atrial fibrillation)    Hypochromic anemia:  Protein calorie malnutrition severe   Dyslipidemia associated with type 2 diabetes mellitus. He does have verbal outbursts; calling names etc. His cbg readings have been elevated requiring adjustment of his medications.   Past Medical History:  Diagnosis Date   DM (diabetes mellitus) ([place])    History of seizures    Hypertension    Stroke [facility])     Past Surgical History:  Procedure Laterality Date   CRANIECTOMY Right 04/01/2017   Procedure: RIGHT DECOMPRESSIVE CRANIECTOMY;  Surgeon: Ditty, Kevan Ny, MD;  Location: [place];  Service: Neurosurgery;  Laterality: Right;   ESOPHAGOGASTRODUODENOSCOPY N/A 04/13/2017   Procedure: ESOPHAGOGASTRODUODENOSCOPY (EGD);  Surgeon: Georganna Skeans, MD;  Location: [place];  Service: General;  Laterality: N/A;  bedside   INCISION AND DRAINAGE PERIRECTAL ABSCESS N/A 09/28/2017    Procedure: IRRIGATION AND DEBRIDEMENT PERIANAL ABSCESS;  Surgeon: Virl Cagey, MD;  Location: [facility];  Service: General;  Laterality: N/A;   PEG PLACEMENT N/A 04/13/2017   Procedure: PERCUTANEOUS ENDOSCOPIC GASTROSTOMY (PEG) PLACEMENT;  Surgeon: Georganna Skeans, MD;  Location: [facility] ENDOSCOPY;  Service: General;  Laterality: N/A;    Social History   Socioeconomic History   Marital status: Unknown    Spouse name: Not on file   Number of children: Not on file   Years of education: Not on file   Highest education level: Not on file  Occupational History   Not on file  Tobacco Use   Smoking status: Former    Types: Cigarettes   Smokeless tobacco: Never   Tobacco comments:    UTA  Vaping Use   Vaping Use: Never used  Substance and Sexual Activity   Alcohol use: No    Comment: UTA   Drug use: No    Comment: UTA   Sexual activity: Not Currently    Birth control/protection: None  Other Topics Concern   Not on file  Social History Narrative   Not on file   Social Determinants of Health   Financial Resource Strain: Not on file  Food Insecurity: Not on file  Transportation Needs: Not on file  Physical Activity: Not on file  Stress: Not on file  Social Connections: Not on file  Intimate Partner Violence: Not on file   Family  History  Problem Relation Age of Onset   Hypertension Father       VITAL SIGNS BP 126/72   Pulse 75   Temp 98.6 F (37 C)   Resp (!) 22   Ht '5\' 6"'$  (1.676 m)   Wt 174 lb (78.9 kg)   SpO2 97%   BMI 28.08 kg/m   Outpatient Encounter Medications as of 03/30/2022  Medication Sig   acetaminophen (TYLENOL) 325 MG tablet Take 650 mg by mouth every 4 (four) hours as needed. Not to exceed 3 grams in 24 hours   apixaban (ELIQUIS) 5 MG TABS tablet Take 5 mg by mouth 2 (two) times daily.    atorvastatin (LIPITOR) 20 MG tablet Take 20 mg by mouth at bedtime.   bisacodyl (DULCOLAX) 10 MG suppository Place 1 suppository (10 mg total) rectally daily as  needed for moderate constipation.   Cholecalciferol 1.25 MG (50000 UT) capsule Take 50,000 Units by mouth once a week. For Low Vitamin D Level on Monday   Dextromethorphan-guaiFENesin (ROBITUSSIN COUGH+CHEST CONG DM PO) Take 10 mLs by mouth every 6 (six) hours as needed.   docusate (COLACE) 50 MG/5ML liquid Give 10 ml by mouth twice a day   Ensure Max Protein (ENSURE MAX PROTEIN) LIQD daily to support protein repletion   gabapentin (NEURONTIN) 100 MG capsule Take 100 mg by mouth at bedtime.   levETIRAcetam (KEPPRA XR) 500 MG 24 hr tablet Take 500 mg by mouth daily. At bedtime 8 pm   LORazepam (ATIVAN) 2 MG/ML injection Inject 0.5 mLs (1 mg total) into the muscle every 15 (fifteen) minutes as needed. Up to 3 doses for uncontrolled seizure activity   Menthol, Topical Analgesic, (BIOFREEZE) 4 % GEL Apply 1 application topically 2 (two) times daily. Apply to left shoulder   metFORMIN (GLUCOPHAGE-XR) 500 MG 24 hr tablet Take 500 mg by mouth daily with breakfast.   NON FORMULARY Diet:Regular   omeprazole (PRILOSEC) 40 MG capsule Take 40 mg by mouth daily.    QUEtiapine (SEROQUEL) 100 MG tablet Take 100 mg by mouth 2 (two) times daily.   Semaglutide (RYBELSUS) 3 MG TABS Take 3 mg by mouth daily.   No facility-administered encounter medications on file as of 03/30/2022.     SIGNIFICANT DIAGNOSTIC EXAMS  PREVIOUS   06-10-21: thyroid biopsy   NO NEW EXAMS.   LABS REVIEWED PREVIOUS:     05-03-21: hgb a1c 4.9; chol 117; ldl 64; trig 125; hdl 28; mag 2.0; vit D 38.55 micro albumin 92.8 08-06-21: wbc 10.0; hgb 10.8 hct 37.8; mcv 76.8 plt 249; glucose 118; bun 11; creat 0.77; k+ 3.1; na++ 136; ca 8.5 GFR>60 08-13-21: wbc 11.9; hgb 10.3; hct 36.2; mcv 77.4 plt 251; glucose 69; bun 8; creat 0.69; k+ 3.2; na++ 140; ca 8.3 GFR >60 10-21-21: glucose 133; bun 9; creat 0.67; k+ 4.0; na++ 139; ca 8.7; protein 6.1; albumin 2.6; hgb a1c 5.7; chol 99; ldl 49; trig 104; hdl 29 01-06-22: PSA 2.14 01-19-22: wbc 10.8;  hgb 11.3; hct 38.7; mcv 75.6 plt 214; glucose 251; bun 11; creat 0.69; k+ 3.6; na++ 135; ca 8.6; gfr >60; protein 6.8; albumin 2.9 keppra 13.2 lactic acid 0.9 tsh 1.318 02-10-22: vitamin D 43.68  03-14-22; hgb a1c 7.5  03-28-22: glucose 336; bun 7; creat 0.61; k+ 3.6; na++ 137; ca 8.9; gfr>60  NO NEW LABS.   Review of Systems  Constitutional:  Negative for malaise/fatigue.  Respiratory:  Negative for cough and shortness of breath.   Cardiovascular:  Negative for chest pain, palpitations and leg swelling.  Gastrointestinal:  Negative for abdominal pain, constipation and heartburn.  Musculoskeletal:  Negative for back pain, joint pain and myalgias.  Skin: Negative.   Neurological:  Negative for dizziness.  Psychiatric/Behavioral:  The patient is not nervous/anxious.    Physical Exam Constitutional:      General: He is not in acute distress.    Appearance: He is well-developed. He is not diaphoretic.  Neck:     Thyroid: Thyroid mass and thyromegaly present.  Cardiovascular:     Rate and Rhythm: Normal rate and regular rhythm.     Pulses: Normal pulses.     Heart sounds: Normal heart sounds.  Pulmonary:     Effort: Pulmonary effort is normal. No respiratory distress.     Breath sounds: Normal breath sounds.  Abdominal:     General: Bowel sounds are normal. There is no distension.     Palpations: Abdomen is soft.     Tenderness: There is no abdominal tenderness.  Musculoskeletal:     Cervical back: Neck supple.     Right lower leg: No edema.     Left lower leg: No edema.     Comments: Left hemiplegia   Lymphadenopathy:     Cervical: No cervical adenopathy.  Skin:    General: Skin is warm and dry.  Neurological:     Mental Status: He is alert. Mental status is at baseline.  Psychiatric:        Mood and Affect: Mood normal.      ASSESSMENT/ PLAN:  TODAY;   AF (paroxsymal atrial fibrillation) heart rate is stable will continue eliquis 5 mg twice daily.   2. PBA he is  having verbal out bursts: will begin nuedexta 20/10 mg daily for one week then twice daily; will monitor his response.   3. Hypochromic anemia: hgb 11.3 will monitor   4. Protein calorie malnutrition severe; albumin 2.9; will continue ensure daily; has declined prostat  5. Dyslipidemia associated with type 2 diabetes mellitus: LDL 49 will continue lipitor 20 mg daily    PREVIOUS   6. Diabetic peripheral neuropathy: will continue gabapentin 100 mg nightly   7. Seizure: without recent activity will continue keppra xr 500 mg daily   8. Chronic constipation: will continue colace twice daily   9. History of CVA; due to embolism of right middle cerebral artery: is s/p compression right frontotemporoparteital craniotomy with duraplasty 2018. Will continue eliquis 5 mg twice daily   10. Vitamin D deficiency: is 38.55; will continue 50, 000 units weekly; he is not yet 61 years of age for a dexa scan.   11. 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   12. GERD without esophagitis: will continue prilosec 40 mg daily   13. Multinodular goiter: is status post aspiration   14. Type 2 diabetes mellitus with neurological complications: hgb P3I 7.5 has recently had his medications adjusted: will continue metformin XR 500 mg daily; rybelisus 3 mg daily   15. Hypertension associated with type 2 diabetes mellitus: b/p 126/72 will monitor is off lopressor  16. 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

## 2022-04-01 ENCOUNTER — Encounter: Payer: Self-pay | Admitting: Adult Health

## 2022-04-01 ENCOUNTER — Non-Acute Institutional Stay (SKILLED_NURSING_FACILITY): Payer: Medicaid Other | Admitting: Adult Health

## 2022-04-01 DIAGNOSIS — F22 Delusional disorders: Secondary | ICD-10-CM | POA: Diagnosis not present

## 2022-04-01 DIAGNOSIS — I69354 Hemiplegia and hemiparesis following cerebral infarction affecting left non-dominant side: Secondary | ICD-10-CM | POA: Diagnosis not present

## 2022-04-01 DIAGNOSIS — R569 Unspecified convulsions: Secondary | ICD-10-CM | POA: Diagnosis not present

## 2022-04-01 NOTE — Progress Notes (Signed)
Location:  [place] Room Number: 113-D Place of Service:  SNF (31)   CODE STATUS: DNR  Allergies  Allergen Reactions   Cheese    Penicillins Hives    Has patient had a PCN reaction causing immediate rash, facial/tongue/throat swelling, SOB or lightheadedness with hypotension: Unknown Has patient had a PCN reaction causing severe rash involving mucus membranes or skin necrosis: Unknown Has patient had a PCN reaction that required hospitalization: No Has patient had a PCN reaction occurring within the last 10 years: No If all of the above answers are "NO", then may proceed with Cephalosporin use.     Chief Complaint  Patient presents with   Acute Visit    Care plan meeting    HPI:  We have come together for his care plan meeting. BIMS 15/15 mood 0/30. Is nonambulatory has had no falls. He requires extensive assist with adls. He is frequently incontinent of bladder and bowel. Dietary: feeds self; regular diet appetite 1-50% of meals weight is stable at 174.2 pounds. Therapy: none at this time. Activities; he has come to 2 different group events recently. He continues to be followed for his chronic illnesses including:   Hemiparesis affecting  left side asa late effect of cerebrovascular accident (CVA)   Paranoia   Seizure  Past Medical History:  Diagnosis Date   DM (diabetes mellitus) ([place])    History of seizures    Hypertension    Stroke [facility])     Past Surgical History:  Procedure Laterality Date   CRANIECTOMY Right 04/01/2017   Procedure: RIGHT DECOMPRESSIVE CRANIECTOMY;  Surgeon: Ditty, Kevan Ny, MD;  Location: [place];  Service: Neurosurgery;  Laterality: Right;   ESOPHAGOGASTRODUODENOSCOPY N/A 04/13/2017   Procedure: ESOPHAGOGASTRODUODENOSCOPY (EGD);  Surgeon: Georganna Skeans, MD;  Location: [place];  Service: General;  Laterality: N/A;  bedside   INCISION AND DRAINAGE PERIRECTAL ABSCESS N/A 09/28/2017   Procedure: IRRIGATION AND  DEBRIDEMENT PERIANAL ABSCESS;  Surgeon: Virl Cagey, MD;  Location: [facility];  Service: General;  Laterality: N/A;   PEG PLACEMENT N/A 04/13/2017   Procedure: PERCUTANEOUS ENDOSCOPIC GASTROSTOMY (PEG) PLACEMENT;  Surgeon: Georganna Skeans, MD;  Location: [facility] ENDOSCOPY;  Service: General;  Laterality: N/A;    Social History   Socioeconomic History   Marital status: Unknown    Spouse name: Not on file   Number of children: Not on file   Years of education: Not on file   Highest education level: Not on file  Occupational History   Not on file  Tobacco Use   Smoking status: Former    Types: Cigarettes   Smokeless tobacco: Never   Tobacco comments:    UTA  Vaping Use   Vaping Use: Never used  Substance and Sexual Activity   Alcohol use: No    Comment: UTA   Drug use: No    Comment: UTA   Sexual activity: Not Currently    Birth control/protection: None  Other Topics Concern   Not on file  Social History Narrative   Not on file   Social Determinants of Health   Financial Resource Strain: Not on file  Food Insecurity: Not on file  Transportation Needs: Not on file  Physical Activity: Not on file  Stress: Not on file  Social Connections: Not on file  Intimate Partner Violence: Not on file   Family History  Problem Relation Age of Onset   Hypertension Father       VITAL SIGNS BP 126/72  Pulse 75   Temp 97.6 F (36.4 C)   Resp (!) 22   Ht '5\' 6"'$  (1.676 m)   Wt 174 lb (78.9 kg)   SpO2 97%   BMI 28.08 kg/m   Outpatient Encounter Medications as of 04/01/2022  Medication Sig   acetaminophen (TYLENOL) 325 MG tablet Take 650 mg by mouth every 4 (four) hours as needed. Not to exceed 3 grams in 24 hours   apixaban (ELIQUIS) 5 MG TABS tablet Take 5 mg by mouth 2 (two) times daily.    atorvastatin (LIPITOR) 20 MG tablet Take 20 mg by mouth at bedtime.   bisacodyl (DULCOLAX) 10 MG suppository Place 1 suppository (10 mg total) rectally daily as needed for moderate  constipation.   Cholecalciferol 1.25 MG (50000 UT) capsule Take 50,000 Units by mouth once a week. For Low Vitamin D Level on Monday   Dextromethorphan-guaiFENesin (ROBITUSSIN COUGH+CHEST CONG DM PO) Take 10 mLs by mouth every 6 (six) hours as needed.   Dextromethorphan-quiNIDine (NUEDEXTA) 20-10 MG capsule Take 1 capsule by mouth daily.   docusate (COLACE) 50 MG/5ML liquid Give 10 ml by mouth twice a day   gabapentin (NEURONTIN) 100 MG capsule Take 100 mg by mouth at bedtime.   levETIRAcetam (KEPPRA XR) 500 MG 24 hr tablet Take 500 mg by mouth daily. At bedtime 8 pm   LORazepam (ATIVAN) 2 MG/ML injection Inject 0.5 mLs (1 mg total) into the muscle every 15 (fifteen) minutes as needed. Up to 3 doses for uncontrolled seizure activity   Menthol, Topical Analgesic, (BIOFREEZE) 4 % GEL Apply 1 application topically 2 (two) times daily. Apply to left shoulder   metFORMIN (GLUCOPHAGE-XR) 500 MG 24 hr tablet Take 500 mg by mouth daily with breakfast.   NON FORMULARY Diet:Regular   omeprazole (PRILOSEC) 40 MG capsule Take 40 mg by mouth daily.    QUEtiapine (SEROQUEL) 100 MG tablet Take 100 mg by mouth 2 (two) times daily.   Semaglutide (RYBELSUS) 3 MG TABS Take 3 mg by mouth daily.   [DISCONTINUED] Dextromethorphan-quiNIDine (NUEDEXTA) 20-10 MG capsule Take 1 capsule by mouth 2 (two) times daily.   [DISCONTINUED] Ensure Max Protein (ENSURE MAX PROTEIN) LIQD daily to support protein repletion   No facility-administered encounter medications on file as of 04/01/2022.     SIGNIFICANT DIAGNOSTIC EXAMS   PREVIOUS   06-10-21: thyroid biopsy   NO NEW EXAMS.   LABS REVIEWED PREVIOUS:     05-03-21: hgb a1c 4.9; chol 117; ldl 64; trig 125; hdl 28; mag 2.0; vit D 38.55 micro albumin 92.8 08-06-21: wbc 10.0; hgb 10.8 hct 37.8; mcv 76.8 plt 249; glucose 118; bun 11; creat 0.77; k+ 3.1; na++ 136; ca 8.5 GFR>60 08-13-21: wbc 11.9; hgb 10.3; hct 36.2; mcv 77.4 plt 251; glucose 69; bun 8; creat 0.69; k+ 3.2;  na++ 140; ca 8.3 GFR >60 10-21-21: glucose 133; bun 9; creat 0.67; k+ 4.0; na++ 139; ca 8.7; protein 6.1; albumin 2.6; hgb a1c 5.7; chol 99; ldl 49; trig 104; hdl 29 01-06-22: PSA 2.14 01-19-22: wbc 10.8; hgb 11.3; hct 38.7; mcv 75.6 plt 214; glucose 251; bun 11; creat 0.69; k+ 3.6; na++ 135; ca 8.6; gfr >60; protein 6.8; albumin 2.9 keppra 13.2 lactic acid 0.9 tsh 1.318 02-10-22: vitamin D 43.68  03-14-22; hgb a1c 7.5  03-28-22: glucose 336; bun 7; creat 0.61; k+ 3.6; na++ 137; ca 8.9; gfr>60  NO NEW LABS.   Review of Systems  Constitutional:  Negative for malaise/fatigue.  Respiratory:  Negative for  cough and shortness of breath.   Cardiovascular:  Negative for chest pain, palpitations and leg swelling.  Gastrointestinal:  Negative for abdominal pain, constipation and heartburn.  Musculoskeletal:  Negative for back pain, joint pain and myalgias.  Skin: Negative.   Neurological:  Negative for dizziness.  Psychiatric/Behavioral:  The patient is not nervous/anxious.    Physical Exam Constitutional:      General: He is not in acute distress.    Appearance: He is well-developed. He is not diaphoretic.  Neck:     Thyroid: Thyroid mass and thyromegaly present.  Cardiovascular:     Rate and Rhythm: Normal rate and regular rhythm.     Pulses: Normal pulses.     Heart sounds: Normal heart sounds.  Pulmonary:     Effort: Pulmonary effort is normal. No respiratory distress.     Breath sounds: Normal breath sounds.  Abdominal:     General: Bowel sounds are normal. There is no distension.     Palpations: Abdomen is soft.     Tenderness: There is no abdominal tenderness.  Musculoskeletal:     Cervical back: Neck supple.     Right lower leg: No edema.     Left lower leg: No edema.     Comments:  Left hemiplegia    Lymphadenopathy:     Cervical: No cervical adenopathy.  Skin:    General: Skin is warm and dry.  Neurological:     Mental Status: He is alert and oriented to person, place, and  time.  Psychiatric:        Mood and Affect: Mood normal.       ASSESSMENT/ PLAN:  TODAY  Hemiparesis affecting  left side asa late effect of cerebrovascular accident (CVA)  Paranoia  Seizure  Will continue current medications Will continue current plan of care Will continue to monitor his status.   Time spent with patient 40 minutes: medication; plan of care; dietary    Ok Edwards NP [facility]   call [phone]

## 2022-04-07 ENCOUNTER — Non-Acute Institutional Stay (SKILLED_NURSING_FACILITY): Payer: Medicaid Other | Admitting: Internal Medicine

## 2022-04-07 ENCOUNTER — Encounter: Payer: Self-pay | Admitting: Internal Medicine

## 2022-04-07 DIAGNOSIS — F482 Pseudobulbar affect: Secondary | ICD-10-CM

## 2022-04-07 DIAGNOSIS — I69354 Hemiplegia and hemiparesis following cerebral infarction affecting left non-dominant side: Secondary | ICD-10-CM | POA: Diagnosis not present

## 2022-04-07 DIAGNOSIS — D509 Iron deficiency anemia, unspecified: Secondary | ICD-10-CM

## 2022-04-07 DIAGNOSIS — E1149 Type 2 diabetes mellitus with other diabetic neurological complication: Secondary | ICD-10-CM | POA: Diagnosis not present

## 2022-04-07 DIAGNOSIS — R569 Unspecified convulsions: Secondary | ICD-10-CM

## 2022-04-07 DIAGNOSIS — I480 Paroxysmal atrial fibrillation: Secondary | ICD-10-CM | POA: Diagnosis not present

## 2022-04-07 NOTE — Assessment & Plan Note (Signed)
No seizures reported by staff. No change indicated.

## 2022-04-07 NOTE — Assessment & Plan Note (Signed)
Serially anemia has improved with current H/H of 11.3/38.7.  No bleeding dyscrasias reported by staff.

## 2022-04-07 NOTE — Assessment & Plan Note (Addendum)
Recent glucoses have ranged from the high 200s to the low 300s in the context of dietary noncompliance as per staff.  A1c is 7.5%.  He refuses injection such as insulin; Rybelsus has been initiated with ongoing glucose monitor. Increased risk of heart attack or stroke with uncontrolled diabetes discussed with him.

## 2022-04-07 NOTE — Assessment & Plan Note (Signed)
Left-sided hemiparesis is unchanged.

## 2022-04-07 NOTE — Progress Notes (Signed)
NURSING HOME LOCATION:  [facility] ROOM NUMBER:  113 D  CODE STATUS:  DNR  PCP:  Ok Edwards NP  This is a nursing facility follow up visit of chronic medical diagnoses & to document compliance with Regulation 483.30 (c) in [facility] Manual Phase 2 which mandates caregiver visit ( visits can alternate among physician, PA or NP as per statutes) within 10 days of 30 days / 60 days/ 90 days post admission to SNF date    Interim medical record and care since last SNF visit was updated with review of diagnostic studies and change in clinical status since last visit were documented.  HPI: He is a permanent resident of facility with history of seizure disorder, essential hypertension, history of stroke, normal affect, and diabetes with neurovascular complications. Significant surgeries in procedures including craniotomy and PEG placement in 2018.  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  A1c is current with a value of 7.5%, up from a value of 5.7% in March of this year.  Microcytic, hypochromic anemia has actually improved serially with most recent values 11.3/38.7. Current TSH is therapeutic at 1.318.  Review of systems: Recently glucoses have been in the high 200s and low 300; staff describes dietary noncompliance.  On the bureau beside his bed are multiple sweet  & high carbohydrate snacks, as well as high fructose corn syrup containing beverages.  He refuses any injections such as insulin.  Because of the persistent hyperglycemia Rybelsus was initiated. Today he tells me that he did administer insulin to himself at home but "I do not like needles."  He does validate that the glucoses have been "kind of high."  He does describe excessive urination.  He has numbness and tingling the right hand.  He has some chronic constipation.  He describes shortness of breath when he rolls from side to side. Behavioral issues have also been reported in that he is  discourteous to specific staff.  Constitutional: No fever, significant weight change, fatigue  Eyes: No redness, discharge, pain, vision change ENT/mouth: No nasal congestion,  purulent discharge, earache, change in hearing, sore throat  Cardiovascular: No chest pain, palpitations, paroxysmal nocturnal dyspnea, claudication, edema  Respiratory: No cough, sputum production, hemoptysis,  significant snoring, apnea   Gastrointestinal: No heartburn, dysphagia, abdominal pain, nausea /vomiting, rectal bleeding, melena Genitourinary: No dysuria, hematuria, pyuria, incontinence, nocturia Musculoskeletal: No joint stiffness, joint swelling,  pain Dermatologic: No rash, pruritus, change in appearance of skin Neurologic: No dizziness, headache, syncope, seizures Psychiatric: No significant anxiety, depression, insomnia, anorexia Endocrine: No change in hair/skin/nails, excessive thirst, excessive hunger  Hematologic/lymphatic: No significant bruising, lymphadenopathy, abnormal bleeding Allergy/immunology: No itchy/watery eyes, significant sneezing, urticaria, angioedema  Physical exam:  Pertinent or positive findings: Facies are blank.  He exhibits a baby talk type vocal pattern.  Pattern alopecia is present.  Arcus senilis is present.  The maxilla is edentulous.  The mandibular teeth exhibit poor hygiene with caries and plaque.  The thyroid is asymmetrically enlarged.  Gallop cadence is present.  He has central obesity.  The pedal pulses are surprisingly strong.  Clubbing of the nailbeds is noted.  He has left hemiparesis.  The left hand is clawed.  General appearance: Adequately nourished; no acute distress, increased work of breathing is present.   Lymphatic: No lymphadenopathy about the head, neck, axilla. Eyes: No conjunctival inflammation or lid edema is present. There is no scleral icterus. Ears:  External ear exam shows no significant  lesions or deformities.   Nose:  External nasal examination  shows no deformity or inflammation. Nasal mucosa are pink and moist without lesions, exudates Neck:  No tenderness noted.    Heart:  Normal rate and regular rhythm. S1 and S2 normal without murmur, click, rub .  Lungs: Chest clear to auscultation without wheezes, rhonchi, rales, rubs. Abdomen: Bowel sounds are normal. Abdomen is soft and nontender with no organomegaly, hernias, masses. GU: Deferred  Extremities:  No cyanosis, edema  Neurologic exam :Balance, Rhomberg, finger to nose testing could not be completed due to clinical state Skin: Warm & dry w/o tenting. No significant lesions or rash.  See summary under each active problem in the Problem List with associated updated therapeutic plan

## 2022-04-07 NOTE — Patient Instructions (Signed)
See assessment and plan under each diagnosis in the problem list and acutely for this visit 

## 2022-04-07 NOTE — Assessment & Plan Note (Addendum)
He has a large supply of high carbohydrate and sweet snacks at bedside suggesting hoarding behavior.  He has been discourteous to certain staff members.

## 2022-04-07 NOTE — Assessment & Plan Note (Signed)
He exhibits a gallop cadence but rhythm is regular with no evidence clinically of A-fib.

## 2022-05-11 ENCOUNTER — Encounter: Payer: Self-pay | Admitting: Adult Health

## 2022-05-11 ENCOUNTER — Non-Acute Institutional Stay (SKILLED_NURSING_FACILITY): Payer: Medicaid Other | Admitting: Adult Health

## 2022-05-11 DIAGNOSIS — E1142 Type 2 diabetes mellitus with diabetic polyneuropathy: Secondary | ICD-10-CM | POA: Diagnosis not present

## 2022-05-11 DIAGNOSIS — Z8673 Personal history of transient ischemic attack (TIA), and cerebral infarction without residual deficits: Secondary | ICD-10-CM | POA: Diagnosis not present

## 2022-05-11 DIAGNOSIS — K5909 Other constipation: Secondary | ICD-10-CM

## 2022-05-11 DIAGNOSIS — R569 Unspecified convulsions: Secondary | ICD-10-CM

## 2022-05-11 NOTE — Progress Notes (Signed)
Location:  [place] Room Number: 113 Place of Service:  SNF (31) Ok Edwards, NP  CODE STATUS: DNR  Allergies  Allergen Reactions   Cheese    Penicillins Hives    Has patient had a PCN reaction causing immediate rash, facial/tongue/throat swelling, SOB or lightheadedness with hypotension: Unknown Has patient had a PCN reaction causing severe rash involving mucus membranes or skin necrosis: Unknown Has patient had a PCN reaction that required hospitalization: No Has patient had a PCN reaction occurring within the last 10 years: No If all of the above answers are "NO", then may proceed with Cephalosporin use.     Chief Complaint  Patient presents with   Medical Management of Chronic Issues                                      Diabetic peripheral neuropathy: Seizure:    Chronic constipation:  History of cva    HPI:  He is a 61 year old long term resident of this facility being seen for the management of his chronic illnesses: Diabetic peripheral neuropathy: Seizure:    Chronic constipation:  History of cva. There are no reports of uncontrolled pain. There are no reports of anxiety or agitation.   Past Medical History:  Diagnosis Date   DM (diabetes mellitus) ([place])    History of seizures    Hypertension    Stroke [facility])     Past Surgical History:  Procedure Laterality Date   CRANIECTOMY Right 04/01/2017   Procedure: RIGHT DECOMPRESSIVE CRANIECTOMY;  Surgeon: Ditty, Kevan Ny, MD;  Location: [place];  Service: Neurosurgery;  Laterality: Right;   ESOPHAGOGASTRODUODENOSCOPY N/A 04/13/2017   Procedure: ESOPHAGOGASTRODUODENOSCOPY (EGD);  Surgeon: Georganna Skeans, MD;  Location: [place];  Service: General;  Laterality: N/A;  bedside   INCISION AND DRAINAGE PERIRECTAL ABSCESS N/A 09/28/2017   Procedure: IRRIGATION AND DEBRIDEMENT PERIANAL ABSCESS;  Surgeon: Virl Cagey, MD;  Location: [facility];  Service: General;  Laterality: N/A;   PEG  PLACEMENT N/A 04/13/2017   Procedure: PERCUTANEOUS ENDOSCOPIC GASTROSTOMY (PEG) PLACEMENT;  Surgeon: Georganna Skeans, MD;  Location[facility] ENDOSCOPY;  Service: General;  Laterality: N/A;    Social History   Socioeconomic History   Marital status: Unknown    Spouse name: Not on file   Number of children: Not on file   Years of education: Not on file   Highest education level: Not on file  Occupational History   Not on file  Tobacco Use   Smoking status: Former    Types: Cigarettes   Smokeless tobacco: Never   Tobacco comments:    UTA  Vaping Use   Vaping Use: Never used  Substance and Sexual Activity   Alcohol use: No    Comment: UTA   Drug use: No    Comment: UTA   Sexual activity: Not Currently    Birth control/protection: None  Other Topics Concern   Not on file  Social History Narrative   Not on file   Social Determinants of Health   Financial Resource Strain: Not on file  Food Insecurity: Not on file  Transportation Needs: Not on file  Physical Activity: Not on file  Stress: Not on file  Social Connections: Not on file  Intimate Partner Violence: Not on file   Family History  Problem Relation Age of Onset   Hypertension Father  VITAL SIGNS BP (!) 161/74   Pulse 85   Temp 97.8 F (36.6 C)   Resp 20   Ht '5\' 6"'$  (1.676 m)   Wt 175 lb 12.8 oz (79.7 kg)   SpO2 98%   BMI 28.37 kg/m   Outpatient Encounter Medications as of 05/11/2022  Medication Sig   acetaminophen (TYLENOL) 325 MG tablet Take 650 mg by mouth every 6 (six) hours as needed. Not to exceed 3 grams in 24 hours   apixaban (ELIQUIS) 5 MG TABS tablet Take 5 mg by mouth 2 (two) times daily.    atorvastatin (LIPITOR) 20 MG tablet Take 20 mg by mouth at bedtime.   Cholecalciferol 1.25 MG (50000 UT) capsule Take 50,000 Units by mouth once a week. For Low Vitamin D Level on Monday   Dextromethorphan-quiNIDine (NUEDEXTA) 20-10 MG capsule Take 1 capsule by mouth 2 (two) times daily.   docusate  (COLACE) 50 MG/5ML liquid Take 100 mg by mouth at bedtime. Give 10 ml by mouth twice a day   gabapentin (NEURONTIN) 100 MG capsule Take 100 mg by mouth at bedtime.   levETIRAcetam (KEPPRA XR) 500 MG 24 hr tablet Take 500 mg by mouth daily. At bedtime 8 pm   LORazepam (ATIVAN) 2 MG/ML injection Inject 0.5 mLs (1 mg total) into the muscle every 15 (fifteen) minutes as needed. Up to 3 doses for uncontrolled seizure activity   Menthol, Topical Analgesic, (BIOFREEZE) 4 % GEL Apply 1 application  topically at bedtime as needed. Apply to left shoulder   metFORMIN (GLUCOPHAGE-XR) 500 MG 24 hr tablet Take 500 mg by mouth daily with breakfast.   NON FORMULARY Diet:Regular   omeprazole (PRILOSEC) 40 MG capsule Take 40 mg by mouth daily.    QUEtiapine (SEROQUEL) 100 MG tablet Take 100 mg by mouth 2 (two) times daily.   Semaglutide (RYBELSUS) 3 MG TABS Take 3 mg by mouth daily.   [DISCONTINUED] bisacodyl (DULCOLAX) 10 MG suppository Place 1 suppository (10 mg total) rectally daily as needed for moderate constipation.   [DISCONTINUED] Dextromethorphan-guaiFENesin (ROBITUSSIN COUGH+CHEST CONG DM PO) Take 10 mLs by mouth every 6 (six) hours as needed.   No facility-administered encounter medications on file as of 05/11/2022.     SIGNIFICANT DIAGNOSTIC EXAMS  PREVIOUS   06-10-21: thyroid biopsy   NO NEW EXAMS.   LABS REVIEWED PREVIOUS:     08-06-21: wbc 10.0; hgb 10.8 hct 37.8; mcv 76.8 plt 249; glucose 118; bun 11; creat 0.77; k+ 3.1; na++ 136; ca 8.5 GFR>60 08-13-21: wbc 11.9; hgb 10.3; hct 36.2; mcv 77.4 plt 251; glucose 69; bun 8; creat 0.69; k+ 3.2; na++ 140; ca 8.3 GFR >60 10-21-21: glucose 133; bun 9; creat 0.67; k+ 4.0; na++ 139; ca 8.7; protein 6.1; albumin 2.6; hgb a1c 5.7; chol 99; ldl 49; trig 104; hdl 29 01-06-22: PSA 2.14 01-19-22: wbc 10.8; hgb 11.3; hct 38.7; mcv 75.6 plt 214; glucose 251; bun 11; creat 0.69; k+ 3.6; na++ 135; ca 8.6; gfr >60; protein 6.8; albumin 2.9 keppra 13.2 lactic acid  0.9 tsh 1.318 02-10-22: vitamin D 43.68  03-14-22; hgb a1c 7.5  03-28-22: glucose 336; bun 7; creat 0.61; k+ 3.6; na++ 137; ca 8.9; gfr>60  NO NEW LABS.   Review of Systems  Constitutional:  Negative for malaise/fatigue.  Respiratory:  Negative for cough and shortness of breath.   Cardiovascular:  Negative for chest pain, palpitations and leg swelling.  Gastrointestinal:  Negative for abdominal pain, constipation and heartburn.  Musculoskeletal:  Negative for  back pain, joint pain and myalgias.  Skin: Negative.   Neurological:  Negative for dizziness.  Psychiatric/Behavioral:  The patient is not nervous/anxious.    Physical Exam Constitutional:      General: He is not in acute distress.    Appearance: He is well-developed. He is not diaphoretic.  Neck:     Thyroid: Thyroid mass and thyromegaly present.  Cardiovascular:     Rate and Rhythm: Normal rate and regular rhythm.     Pulses: Normal pulses.     Heart sounds: Normal heart sounds.  Pulmonary:     Effort: Pulmonary effort is normal. No respiratory distress.     Breath sounds: Normal breath sounds.  Abdominal:     General: Bowel sounds are normal. There is no distension.     Palpations: Abdomen is soft.     Tenderness: There is no abdominal tenderness.  Musculoskeletal:     Cervical back: Neck supple.     Right lower leg: No edema.     Left lower leg: No edema.     Comments:  Left hemiplegia     Lymphadenopathy:     Cervical: No cervical adenopathy.  Skin:    General: Skin is warm and dry.  Neurological:     Mental Status: He is alert. Mental status is at baseline.  Psychiatric:        Mood and Affect: Mood normal.        ASSESSMENT/ PLAN:  TODAY;   Diabetic peripheral neuropathy: will continue gabapentin 100 mg nightly   2. Seizure: without recent activity: will continue keppra xl 500 mg daily   3. Chronic constipation: will continue colace twice daily   4. 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 Will continue elequis 5 mg twice daily    PREVIOUS   5. Vitamin D deficiency: is 38.55; will continue 50, 000 units weekly; he is not yet 61 years of age for a dexa scan.   6. 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   7. GERD without esophagitis: will continue prilosec 40 mg daily   8. Multinodular goiter: is status post aspiration   9. Type 2 diabetes mellitus with neurological complications: hgb D9R 7.5 has recently had his medications adjusted: will continue metformin XR 500 mg daily; rybelisus 3 mg daily   10. Hypertension associated with type 2 diabetes mellitus: b/p 161/74 will monitor is off lopressor  11. Delusions/paranoia is on seroquel 100 mg twice daily is stable   12. AF (paroxsymal atrial fibrillation) heart rate is stable will continue eliquis 5 mg twice daily.   13. PBA he is having less verbal outbursts; will continue nuedexta twice daily   14. Hypochromic anemia: hgb 11.3 will monitor   15. Protein calorie malnutrition severe; albumin 2.9; will continue ensure daily; has declined prostat  16. Dyslipidemia associated with type 2 diabetes mellitus: LDL 49 will continue lipitor 20 mg daily     Ok Edwards NP [facility]   call [phone]

## 2022-05-16 ENCOUNTER — Other Ambulatory Visit (HOSPITAL_COMMUNITY)
Admission: RE | Admit: 2022-05-16 | Discharge: 2022-05-16 | Disposition: A | Discharge status: Home / Self Care | Payer: Medicaid Other | Source: Skilled Nursing Facility | Attending: Adult Health | Admitting: Adult Health

## 2022-05-16 DIAGNOSIS — E1149 Type 2 diabetes mellitus with other diabetic neurological complication: Secondary | ICD-10-CM | POA: Insufficient documentation

## 2022-06-15 ENCOUNTER — Non-Acute Institutional Stay (SKILLED_NURSING_FACILITY): Payer: Medicaid Other | Admitting: Adult Health

## 2022-06-15 ENCOUNTER — Encounter: Payer: Self-pay | Admitting: Adult Health

## 2022-06-15 DIAGNOSIS — E559 Vitamin D deficiency, unspecified: Secondary | ICD-10-CM

## 2022-06-15 DIAGNOSIS — K219 Gastro-esophageal reflux disease without esophagitis: Secondary | ICD-10-CM

## 2022-06-15 DIAGNOSIS — E1149 Type 2 diabetes mellitus with other diabetic neurological complication: Secondary | ICD-10-CM | POA: Diagnosis not present

## 2022-06-15 DIAGNOSIS — I69354 Hemiplegia and hemiparesis following cerebral infarction affecting left non-dominant side: Secondary | ICD-10-CM | POA: Diagnosis not present

## 2022-06-15 DIAGNOSIS — E042 Nontoxic multinodular goiter: Secondary | ICD-10-CM | POA: Diagnosis not present

## 2022-06-15 NOTE — Progress Notes (Signed)
Location:  [place] Room Number: N113D Place of Service:  SNF (31) Bard Herbert, NP   CODE STATUS: DNR  Allergies  Allergen Reactions   Cheese    Penicillins Hives    Has patient had a PCN reaction causing immediate rash, facial/tongue/throat swelling, SOB or lightheadedness with hypotension: Unknown Has patient had a PCN reaction causing severe rash involving mucus membranes or skin necrosis: Unknown Has patient had a PCN reaction that required hospitalization: No Has patient had a PCN reaction occurring within the last 10 years: No If all of the above answers are "NO", then may proceed with Cephalosporin use.     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 There are no reports of uncontrolled pain. He is getting out of bed more often. His weight remains stable. There are no reports of of seizure activities.   Past Medical History:  Diagnosis Date   DM (diabetes mellitus) ([place])    History of seizures    Hypertension    Stroke [facility])     Past Surgical History:  Procedure Laterality Date   CRANIECTOMY Right 04/01/2017   Procedure: RIGHT DECOMPRESSIVE CRANIECTOMY;  Surgeon: Ditty, Kevan Ny, MD;  Location: [place];  Service: Neurosurgery;  Laterality: Right;   ESOPHAGOGASTRODUODENOSCOPY N/A 04/13/2017   Procedure: ESOPHAGOGASTRODUODENOSCOPY (EGD);  Surgeon:  Georganna Skeans, MD;  Location: [place];  Service: General;  Laterality: N/A;  bedside   INCISION AND DRAINAGE PERIRECTAL ABSCESS N/A 09/28/2017   Procedure: IRRIGATION AND DEBRIDEMENT PERIANAL ABSCESS;  Surgeon: Virl Cagey, MD;  Location: [facility];  Service: General;  Laterality: N/A;   PEG PLACEMENT N/A 04/13/2017   Procedure: PERCUTANEOUS ENDOSCOPIC GASTROSTOMY (PEG) PLACEMENT;  Surgeon: Georganna Skeans, MD;  Location: [facility] ENDOSCOPY;  Service: General;  Laterality: N/A;    Social History   Socioeconomic History   Marital status: Unknown    Spouse name: Not on file   Number of children: Not on file   Years of education: Not on file   Highest education level: Not on file  Occupational History   Not on file  Tobacco Use   Smoking status: Former    Types: Cigarettes   Smokeless tobacco: Never   Tobacco comments:    UTA  Vaping Use   Vaping Use: Never used  Substance and Sexual Activity   Alcohol use: No    Comment: UTA   Drug use: No    Comment: UTA   Sexual activity: Not Currently    Birth control/protection: None  Other Topics Concern   Not on file  Social History Narrative   Not on file   Social Determinants of Health   Financial Resource Strain: Not on file  Food Insecurity: Not on file  Transportation Needs: Not on file  Physical Activity: Not on file  Stress: Not on file  Social Connections:  Not on file  Intimate Partner Violence: Not on file   Family History  Problem Relation Age of Onset   Hypertension Father       VITAL SIGNS BP (!) 97/57   Pulse 89   Temp (!) 97.4 F (36.3 C)   Resp 16   Ht '5\' 6"'$  (1.676 m)   Wt 173 lb 12.8 oz (78.8 kg)   SpO2 94%   BMI 28.05 kg/m   Outpatient Encounter Medications as of 06/15/2022  Medication Sig   acetaminophen (TYLENOL) 325 MG tablet Take 650 mg by mouth every 6 (six) hours as needed. Not to exceed 3 grams in 24 hours   apixaban (ELIQUIS) 5 MG TABS tablet Take 5 mg by mouth 2 (two) times daily.     atorvastatin (LIPITOR) 20 MG tablet Take 20 mg by mouth at bedtime.   Cholecalciferol 1.25 MG (50000 UT) capsule Take 50,000 Units by mouth once a week. For Low Vitamin D Level on Monday   docusate (COLACE) 50 MG/5ML liquid Take 100 mg by mouth at bedtime.   gabapentin (NEURONTIN) 100 MG capsule Take 100 mg by mouth at bedtime.   levETIRAcetam (KEPPRA XR) 500 MG 24 hr tablet Take 500 mg by mouth daily. At bedtime 8 pm   LORazepam (ATIVAN) 2 MG/ML injection Inject 0.5 mLs (1 mg total) into the muscle every 15 (fifteen) minutes as needed. Up to 3 doses for uncontrolled seizure activity   Menthol, Topical Analgesic, (BIOFREEZE) 4 % GEL Apply 1 application  topically at bedtime as needed. Apply to left shoulder   metFORMIN (GLUCOPHAGE-XR) 500 MG 24 hr tablet Take 500 mg by mouth daily with breakfast.   NON FORMULARY Diet:Regular   omeprazole (PRILOSEC) 40 MG capsule Take 40 mg by mouth daily.    QUEtiapine (SEROQUEL) 100 MG tablet Take 100 mg by mouth 2 (two) times daily.   Semaglutide (RYBELSUS) 3 MG TABS Take 3 mg by mouth daily.   Dextromethorphan-quiNIDine (NUEDEXTA) 20-10 MG capsule Take 1 capsule by mouth 2 (two) times daily.   No facility-administered encounter medications on file as of 06/15/2022.     SIGNIFICANT DIAGNOSTIC EXAMS  PREVIOUS   06-10-21: thyroid biopsy   NO NEW EXAMS.   LABS REVIEWED PREVIOUS:     08-06-21: wbc 10.0; hgb 10.8 hct 37.8; mcv 76.8 plt 249; glucose 118; bun 11; creat 0.77; k+ 3.1; na++ 136; ca 8.5 GFR>60 08-13-21: wbc 11.9; hgb 10.3; hct 36.2; mcv 77.4 plt 251; glucose 69; bun 8; creat 0.69; k+ 3.2; na++ 140; ca 8.3 GFR >60 10-21-21: glucose 133; bun 9; creat 0.67; k+ 4.0; na++ 139; ca 8.7; protein 6.1; albumin 2.6; hgb a1c 5.7; chol 99; ldl 49; trig 104; hdl 29 01-06-22: PSA 2.14 01-19-22: wbc 10.8; hgb 11.3; hct 38.7; mcv 75.6 plt 214; glucose 251; bun 11; creat 0.69; k+ 3.6; na++ 135; ca 8.6; gfr >60; protein 6.8; albumin 2.9 keppra 13.2 lactic acid 0.9 tsh  1.318 02-10-22: vitamin D 43.68  03-14-22; hgb a1c 7.5  03-28-22: glucose 336; bun 7; creat 0.61; k+ 3.6; na++ 137; ca 8.9; gfr>60  NO NEW LABS.   Review of Systems  Constitutional:  Negative for malaise/fatigue.  Respiratory:  Negative for cough and shortness of breath.   Cardiovascular:  Negative for chest pain, palpitations and leg swelling.  Gastrointestinal:  Negative for abdominal pain, constipation and heartburn.  Musculoskeletal:  Negative for back pain, joint pain and myalgias.  Skin: Negative.   Neurological:  Negative for dizziness.  Psychiatric/Behavioral:  The patient is not nervous/anxious.    Physical Exam Constitutional:      General: He is not in acute distress.    Appearance: He is well-developed. He is not diaphoretic.  Neck:     Thyroid: Thyroid mass and thyromegaly present.  Cardiovascular:     Rate and Rhythm: Normal rate and regular rhythm.     Pulses: Normal pulses.     Heart sounds: Normal heart sounds.  Pulmonary:     Effort: Pulmonary effort is normal. No respiratory distress.     Breath sounds: Normal breath sounds.  Abdominal:     General: Bowel sounds are normal. There is no distension.     Palpations: Abdomen is soft.     Tenderness: There is no abdominal tenderness.  Musculoskeletal:     Cervical back: Neck supple.     Right lower leg: No edema.     Left lower leg: No edema.     Comments: Left hemiplegia  Lymphadenopathy:     Cervical: No cervical adenopathy.  Skin:    General: Skin is warm and dry.  Neurological:     Mental Status: He is alert. Mental status is at baseline.  Psychiatric:        Mood and Affect: Mood normal.      ASSESSMENT/ PLAN:  TODAY;   Vitamin D deficiency: is 43.68 will continue vitamin D 50,000 units weekly   2. Hemiplegia affecting left side as late effect of cerebrovascular accident (CVA); is off baclofen at this time   3. GERD without esophagitis: will continue prilosec 40 mg daily   4. Multinodular  goiter: is status post aspiration; will follow up with annual thyroid labs.   5. Type 2 diabetes mellitus with neurological complications: hgb T7S 7.5 will continue metformin xr 500 mg daily and rybelsis 3 mg daily   PREVIOUS   6. Hypertension associated with type 2 diabetes mellitus: b/p 97/57 will monitor is off lopressor  7. 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 AF (paroxsymal atrial fibrillation) heart rate is stable will continue eliquis 5 mg twice daily.   9. PBA he is having less verbal outbursts; will continue nuedexta twice daily   10. Hypochromic anemia: hgb 11.3 will monitor   11. Protein calorie malnutrition severe; albumin 2.9; will continue ensure daily; has declined prostat  12. Dyslipidemia associated with type 2 diabetes mellitus: LDL 49 will continue lipitor 20 mg daily   13. Diabetic peripheral neuropathy: will continue gabapentin 100 mg nightly   14. Seizure: without recent activity: will continue keppra xl 500 mg daily   15. Chronic constipation: will continue colace twice daily   16. 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 Will continue elequis 5 mg twice daily    Will check cbc; cmp; lipids; hgb A1c thyroid labs   Ok Edwards NP [facility]  call [phone]

## 2022-06-24 ENCOUNTER — Other Ambulatory Visit (HOSPITAL_COMMUNITY)
Admission: RE | Admit: 2022-06-24 | Discharge: 2022-06-24 | Disposition: A | Discharge status: Home / Self Care | Payer: Medicaid Other | Source: Skilled Nursing Facility | Attending: Adult Health | Admitting: Adult Health

## 2022-06-24 DIAGNOSIS — E1149 Type 2 diabetes mellitus with other diabetic neurological complication: Secondary | ICD-10-CM | POA: Insufficient documentation

## 2022-06-24 LAB — CBC
HCT: 34.9 % — ABNORMAL LOW (ref 39.0–52.0)
Hemoglobin: 10 g/dL — ABNORMAL LOW (ref 13.0–17.0)
MCH: 25.1 pg — ABNORMAL LOW (ref 26.0–34.0)
MCHC: 28.7 g/dL — ABNORMAL LOW (ref 30.0–36.0)
MCV: 87.7 fL (ref 80.0–100.0)
Platelets: 241 10*3/uL (ref 150–400)
RBC: 3.98 MIL/uL — ABNORMAL LOW (ref 4.22–5.81)
RDW: 16.9 % — ABNORMAL HIGH (ref 11.5–15.5)
WBC: 9.6 10*3/uL (ref 4.0–10.5)
nRBC: 0 % (ref 0.0–0.2)

## 2022-06-24 LAB — T4, FREE: Free T4: 0.79 ng/dL (ref 0.61–1.12)

## 2022-06-24 LAB — LIPID PANEL
Cholesterol: 116 mg/dL (ref 0–200)
HDL: 32 mg/dL — ABNORMAL LOW (ref >40)
LDL Cholesterol: 68 mg/dL (ref 0–99)
Total CHOL/HDL Ratio: 3.6 RATIO
Triglycerides: 78 mg/dL (ref <150)
VLDL: 16 mg/dL (ref 0–40)

## 2022-06-24 LAB — COMPREHENSIVE METABOLIC PANEL
ALT: 15 U/L (ref 0–44)
AST: 12 U/L — ABNORMAL LOW (ref 15–41)
Albumin: 2.5 g/dL — ABNORMAL LOW (ref 3.5–5.0)
Alkaline Phosphatase: 62 U/L (ref 38–126)
Anion gap: 6 (ref 5–15)
BUN: 8 mg/dL (ref 8–23)
CO2: 24 mmol/L (ref 22–32)
Calcium: 8.5 mg/dL — ABNORMAL LOW (ref 8.9–10.3)
Chloride: 110 mmol/L (ref 98–111)
Creatinine, Ser: 0.67 mg/dL (ref 0.61–1.24)
GFR, Estimated: >60 mL/min (ref >60)
Glucose, Bld: 145 mg/dL — ABNORMAL HIGH (ref 70–99)
Potassium: 3.6 mmol/L (ref 3.5–5.1)
Sodium: 140 mmol/L (ref 135–145)
Total Bilirubin: 0.1 mg/dL — ABNORMAL LOW (ref 0.3–1.2)
Total Protein: 6 g/dL — ABNORMAL LOW (ref 6.5–8.1)

## 2022-06-24 LAB — HEMOGLOBIN A1C
Hgb A1c MFr Bld: 5.3 % (ref 4.8–5.6)
Mean Plasma Glucose: 105.41 mg/dL

## 2022-06-24 LAB — TSH: TSH: 1.238 u[IU]/mL (ref 0.350–4.500)

## 2022-06-24 LAB — VITAMIN D 25 HYDROXY (VIT D DEFICIENCY, FRACTURES): Vit D, 25-Hydroxy: 43.99 ng/mL (ref 30–100)

## 2022-06-26 LAB — T3, FREE: T3, Free: 2.6 pg/mL (ref 2.0–4.4)

## 2022-07-04 ENCOUNTER — Non-Acute Institutional Stay (SKILLED_NURSING_FACILITY): Payer: Medicaid Other | Admitting: Adult Health

## 2022-07-04 ENCOUNTER — Encounter: Payer: Self-pay | Admitting: Adult Health

## 2022-07-04 DIAGNOSIS — E1149 Type 2 diabetes mellitus with other diabetic neurological complication: Secondary | ICD-10-CM | POA: Diagnosis not present

## 2022-07-06 NOTE — Progress Notes (Signed)
Location:  [place] Room Number: 113 Place of Service:  SNF (31)   CODE STATUS: dnr   Allergies  Allergen Reactions   Cheese    Penicillins Hives    Has patient had a PCN reaction causing immediate rash, facial/tongue/throat swelling, SOB or lightheadedness with hypotension: Unknown Has patient had a PCN reaction causing severe rash involving mucus membranes or skin necrosis: Unknown Has patient had a PCN reaction that required hospitalization: No Has patient had a PCN reaction occurring within the last 10 years: No If all of the above answers are "NO", then may proceed with Cephalosporin use.     Chief Complaint  Patient presents with   Acute Visit    Diabetes     HPI:  His hgb A1c is up to 7.5. he does not want any injections. He is presently taking metformin and rybelsus. He denies any excessive hunger or thirst. He has not had any hypoglycemia episodes   Past Medical History:  Diagnosis Date   DM (diabetes mellitus) ([place])    History of seizures    Hypertension    Stroke [facility])     Past Surgical History:  Procedure Laterality Date   CRANIECTOMY Right 04/01/2017   Procedure: RIGHT DECOMPRESSIVE CRANIECTOMY;  Surgeon: Ditty, Kevan Ny, MD;  Location: [place];  Service: Neurosurgery;  Laterality: Right;   ESOPHAGOGASTRODUODENOSCOPY N/A 04/13/2017   Procedure: ESOPHAGOGASTRODUODENOSCOPY (EGD);  Surgeon: Georganna Skeans, MD;  Location: [place];  Service: General;  Laterality: N/A;  bedside   INCISION AND DRAINAGE PERIRECTAL ABSCESS N/A 09/28/2017   Procedure: IRRIGATION AND DEBRIDEMENT PERIANAL ABSCESS;  Surgeon: Virl Cagey, MD;  Location: [facility];  Service: General;  Laterality: N/A;   PEG PLACEMENT N/A 04/13/2017   Procedure: PERCUTANEOUS ENDOSCOPIC GASTROSTOMY (PEG) PLACEMENT;  Surgeon: Georganna Skeans, MD;  Location: [facility] ENDOSCOPY;  Service: General;  Laterality: N/A;    Social History   Socioeconomic History   Marital status:  Unknown    Spouse name: Not on file   Number of children: Not on file   Years of education: Not on file   Highest education level: Not on file  Occupational History   Not on file  Tobacco Use   Smoking status: Former    Types: Cigarettes   Smokeless tobacco: Never   Tobacco comments:    UTA  Vaping Use   Vaping Use: Never used  Substance and Sexual Activity   Alcohol use: No    Comment: UTA   Drug use: No    Comment: UTA   Sexual activity: Not Currently    Birth control/protection: None  Other Topics Concern   Not on file  Social History Narrative   Not on file   Social Determinants of Health   Financial Resource Strain: Not on file  Food Insecurity: Not on file  Transportation Needs: Not on file  Physical Activity: Not on file  Stress: Not on file  Social Connections: Not on file  Intimate Partner Violence: Not on file   Family History  Problem Relation Age of Onset   Hypertension Father       VITAL SIGNS BP 128/72   Pulse 74   Temp 98.6 F (37 C)   Resp 18   Ht '5\' 6"'$  (1.676 m)   Wt 173 lb 12.8 oz (78.8 kg)   SpO2 96%   BMI 28.05 kg/m   Outpatient Encounter Medications as of 07/04/2022  Medication Sig   acetaminophen (TYLENOL) 325 MG tablet  Take 650 mg by mouth every 6 (six) hours as needed. Not to exceed 3 grams in 24 hours   apixaban (ELIQUIS) 5 MG TABS tablet Take 5 mg by mouth 2 (two) times daily.    atorvastatin (LIPITOR) 20 MG tablet Take 20 mg by mouth at bedtime.   Cholecalciferol 1.25 MG (50000 UT) capsule Take 50,000 Units by mouth once a week. For Low Vitamin D Level on Monday   docusate (COLACE) 50 MG/5ML liquid Take 100 mg by mouth at bedtime.   gabapentin (NEURONTIN) 100 MG capsule Take 100 mg by mouth at bedtime.   levETIRAcetam (KEPPRA XR) 500 MG 24 hr tablet Take 500 mg by mouth daily. At bedtime 8 pm   LORazepam (ATIVAN) 2 MG/ML injection Inject 0.5 mLs (1 mg total) into the muscle every 15 (fifteen) minutes as needed. Up to 3 doses  for uncontrolled seizure activity   Menthol, Topical Analgesic, (BIOFREEZE) 4 % GEL Apply 1 application  topically at bedtime as needed. Apply to left shoulder   metFORMIN (GLUCOPHAGE-XR) 500 MG 24 hr tablet Take 500 mg by mouth daily with breakfast.   NON FORMULARY Diet:Regular   omeprazole (PRILOSEC) 40 MG capsule Take 40 mg by mouth daily.    QUEtiapine (SEROQUEL) 100 MG tablet Take 100 mg by mouth 2 (two) times daily.   Semaglutide (RYBELSUS) 3 MG TABS Take 3 mg by mouth daily.   No facility-administered encounter medications on file as of 07/04/2022.     SIGNIFICANT DIAGNOSTIC EXAMS  PREVIOUS   06-10-21: thyroid biopsy   NO NEW EXAMS.   LABS REVIEWED PREVIOUS:     08-06-21: wbc 10.0; hgb 10.8 hct 37.8; mcv 76.8 plt 249; glucose 118; bun 11; creat 0.77; k+ 3.1; na++ 136; ca 8.5 GFR>60 08-13-21: wbc 11.9; hgb 10.3; hct 36.2; mcv 77.4 plt 251; glucose 69; bun 8; creat 0.69; k+ 3.2; na++ 140; ca 8.3 GFR >60 10-21-21: glucose 133; bun 9; creat 0.67; k+ 4.0; na++ 139; ca 8.7; protein 6.1; albumin 2.6; hgb a1c 5.7; chol 99; ldl 49; trig 104; hdl 29 01-06-22: PSA 2.14 01-19-22: wbc 10.8; hgb 11.3; hct 38.7; mcv 75.6 plt 214; glucose 251; bun 11; creat 0.69; k+ 3.6; na++ 135; ca 8.6; gfr >60; protein 6.8; albumin 2.9 keppra 13.2 lactic acid 0.9 tsh 1.318 02-10-22: vitamin D 43.68  03-14-22; hgb a1c 5.7 03-28-22: glucose 336; bun 7; creat 0.61; k+ 3.6; na++ 137; ca 8.9; gfr>60  TODAY  06-24-22: hgb A1c 7.5   Review of Systems  Constitutional:  Negative for malaise/fatigue.  Respiratory:  Negative for cough and shortness of breath.   Cardiovascular:  Negative for chest pain, palpitations and leg swelling.  Gastrointestinal:  Negative for abdominal pain, constipation and heartburn.  Musculoskeletal:  Negative for back pain, joint pain and myalgias.  Skin: Negative.   Neurological:  Negative for dizziness.  Psychiatric/Behavioral:  The patient is not nervous/anxious.     Physical  Exam Constitutional:      General: He is not in acute distress.    Appearance: He is well-developed. He is not diaphoretic.  Neck:     Thyroid: Thyroid mass and thyromegaly present.  Cardiovascular:     Rate and Rhythm: Normal rate and regular rhythm.     Pulses: Normal pulses.     Heart sounds: Normal heart sounds.  Pulmonary:     Effort: Pulmonary effort is normal. No respiratory distress.     Breath sounds: Normal breath sounds.  Abdominal:     General:  Bowel sounds are normal. There is no distension.     Palpations: Abdomen is soft.     Tenderness: There is no abdominal tenderness.  Musculoskeletal:     Cervical back: Neck supple.     Right lower leg: No edema.     Left lower leg: No edema.     Comments: Left hemiplegia   Lymphadenopathy:     Cervical: No cervical adenopathy.  Skin:    General: Skin is warm and dry.  Neurological:     Mental Status: He is alert. Mental status is at baseline.  Psychiatric:        Mood and Affect: Mood normal.      ASSESSMENT/ PLAN:  TODAY  Type 2 diabetes mellitus with neurological complications:   Will increase rybelsus to 7 mg daily will monitor    Ok Edwards NP [facility]  call [phone]

## 2022-07-08 ENCOUNTER — Encounter: Payer: Self-pay | Admitting: Adult Health

## 2022-07-08 ENCOUNTER — Non-Acute Institutional Stay (SKILLED_NURSING_FACILITY): Payer: Medicaid Other | Admitting: Adult Health

## 2022-07-08 DIAGNOSIS — R569 Unspecified convulsions: Secondary | ICD-10-CM

## 2022-07-08 DIAGNOSIS — F01B2 Vascular dementia, moderate, with psychotic disturbance: Secondary | ICD-10-CM | POA: Diagnosis not present

## 2022-07-08 DIAGNOSIS — I152 Hypertension secondary to endocrine disorders: Secondary | ICD-10-CM

## 2022-07-08 DIAGNOSIS — E1159 Type 2 diabetes mellitus with other circulatory complications: Secondary | ICD-10-CM

## 2022-07-08 NOTE — Progress Notes (Signed)
Location:  [place] Room Number: 113-D Place of Service:  SNF (31)   CODE STATUS: DNR  Allergies  Allergen Reactions   Cheese    Penicillins Hives    Has patient had a PCN reaction causing immediate rash, facial/tongue/throat swelling, SOB or lightheadedness with hypotension: Unknown Has patient had a PCN reaction causing severe rash involving mucus membranes or skin necrosis: Unknown Has patient had a PCN reaction that required hospitalization: No Has patient had a PCN reaction occurring within the last 10 years: No If all of the above answers are "NO", then may proceed with Cephalosporin use.     Chief Complaint  Patient presents with   Acute Visit    Care plan meeting     HPI:  We have come together for his care plan meeting. BIMS 15/15 mood 0/30. He is nonambulatory with no falls. He is dependent for his adls care. He is frequently incontinent of bladder and bowel. Dietary: weight  is 172.8 pounds good appetite; regular diet. Therapy: none at this time. Activities: does not participate. He continues to be followed for his chronic illnesses including:  Hypertension associated with type 2 diabetes mellitus Moderate vascular dementia with psychosis Seizures  Past Medical History:  Diagnosis Date   DM (diabetes mellitus) ([place])    History of seizures    Hypertension    Stroke [facility]     Past Surgical History:  Procedure Laterality Date   CRANIECTOMY Right 04/01/2017   Procedure: RIGHT DECOMPRESSIVE CRANIECTOMY;  Surgeon: Ditty, Kevan Ny, MD;  Location: [place];  Service: Neurosurgery;  Laterality: Right;   ESOPHAGOGASTRODUODENOSCOPY N/A 04/13/2017   Procedure: ESOPHAGOGASTRODUODENOSCOPY (EGD);  Surgeon: Georganna Skeans, MD;  Location: [place];  Service: General;  Laterality: N/A;  bedside   INCISION AND DRAINAGE PERIRECTAL ABSCESS N/A 09/28/2017   Procedure: IRRIGATION AND DEBRIDEMENT PERIANAL ABSCESS;  Surgeon: Virl Cagey, MD;   Location: [facility];  Service: General;  Laterality: N/A;   PEG PLACEMENT N/A 04/13/2017   Procedure: PERCUTANEOUS ENDOSCOPIC GASTROSTOMY (PEG) PLACEMENT;  Surgeon: Georganna Skeans, MD;  Location: [facility] ENDOSCOPY;  Service: General;  Laterality: N/A;    Social History   Socioeconomic History   Marital status: Unknown    Spouse name: Not on file   Number of children: Not on file   Years of education: Not on file   Highest education level: Not on file  Occupational History   Not on file  Tobacco Use   Smoking status: Former    Types: Cigarettes   Smokeless tobacco: Never   Tobacco comments:    UTA  Vaping Use   Vaping Use: Never used  Substance and Sexual Activity   Alcohol use: No    Comment: UTA   Drug use: No    Comment: UTA   Sexual activity: Not Currently    Birth control/protection: None  Other Topics Concern   Not on file  Social History Narrative   Not on file   Social Determinants of Health   Financial Resource Strain: Not on file  Food Insecurity: Not on file  Transportation Needs: Not on file  Physical Activity: Not on file  Stress: Not on file  Social Connections: Not on file  Intimate Partner Violence: Not on file   Family History  Problem Relation Age of Onset   Hypertension Father       VITAL SIGNS BP 128/72   Pulse 74   Temp 98.6 F (37 C)   Resp 20  Ht '5\' 6"'$  (1.676 m)   Wt 173 lb (78.5 kg)   SpO2 98%   BMI 27.92 kg/m   Outpatient Encounter Medications as of 07/08/2022  Medication Sig   acetaminophen (TYLENOL) 325 MG tablet Take 650 mg by mouth as directed. Every 4-6 hours as needed, not to exceed 3 grams in 24 hours   apixaban (ELIQUIS) 5 MG TABS tablet Take 5 mg by mouth 2 (two) times daily.    atorvastatin (LIPITOR) 20 MG tablet Take 20 mg by mouth at bedtime.   Cholecalciferol 1.25 MG (50000 UT) capsule Take 50,000 Units by mouth once a week. For Low Vitamin D Level on Monday   Dextromethorphan-quiNIDine (NUEDEXTA) 20-10 MG capsule Take  1 capsule by mouth 2 (two) times daily.   docusate (COLACE) 50 MG/5ML liquid Take 100 mg by mouth at bedtime.   gabapentin (NEURONTIN) 100 MG capsule Take 100 mg by mouth at bedtime.   levETIRAcetam (KEPPRA XR) 500 MG 24 hr tablet Take 500 mg by mouth daily. At bedtime 8 pm   Menthol, Topical Analgesic, (BIOFREEZE) 4 % GEL Apply 1 application  topically at bedtime as needed. Apply to left shoulder   metFORMIN (GLUCOPHAGE-XR) 500 MG 24 hr tablet Take 500 mg by mouth daily with breakfast.   NON FORMULARY Diet:Regular   omeprazole (PRILOSEC) 40 MG capsule Take 40 mg by mouth daily.    QUEtiapine (SEROQUEL) 100 MG tablet Take 100 mg by mouth 2 (two) times daily.   Semaglutide (RYBELSUS) 7 MG TABS Take 7 mg by mouth daily at 2 PM.   [DISCONTINUED] LORazepam (ATIVAN) 2 MG/ML injection Inject 0.5 mLs (1 mg total) into the muscle every 15 (fifteen) minutes as needed. Up to 3 doses for uncontrolled seizure activity   No facility-administered encounter medications on file as of 07/08/2022.     SIGNIFICANT DIAGNOSTIC EXAMS  PREVIOUS   06-10-21: thyroid biopsy   NO NEW EXAMS.   LABS REVIEWED PREVIOUS:     08-06-21: wbc 10.0; hgb 10.8 hct 37.8; mcv 76.8 plt 249; glucose 118; bun 11; creat 0.77; k+ 3.1; na++ 136; ca 8.5 GFR>60 08-13-21: wbc 11.9; hgb 10.3; hct 36.2; mcv 77.4 plt 251; glucose 69; bun 8; creat 0.69; k+ 3.2; na++ 140; ca 8.3 GFR >60 10-21-21: glucose 133; bun 9; creat 0.67; k+ 4.0; na++ 139; ca 8.7; protein 6.1; albumin 2.6; hgb a1c 5.7; chol 99; ldl 49; trig 104; hdl 29 01-06-22: PSA 2.14 01-19-22: wbc 10.8; hgb 11.3; hct 38.7; mcv 75.6 plt 214; glucose 251; bun 11; creat 0.69; k+ 3.6; na++ 135; ca 8.6; gfr >60; protein 6.8; albumin 2.9 keppra 13.2 lactic acid 0.9 tsh 1.318 02-10-22: vitamin D 43.68  03-14-22; hgb a1c 5.7 03-28-22: glucose 336; bun 7; creat 0.61; k+ 3.6; na++ 137; ca 8.9; gfr>60 06-24-22: hgb A1c 7.5  NO NEW LABS.    Review of Systems  Constitutional:  Negative for  malaise/fatigue.  Respiratory:  Negative for cough and shortness of breath.   Cardiovascular:  Negative for chest pain, palpitations and leg swelling.  Gastrointestinal:  Negative for abdominal pain, constipation and heartburn.  Musculoskeletal:  Negative for back pain, joint pain and myalgias.  Skin: Negative.   Neurological:  Negative for dizziness.  Psychiatric/Behavioral:  The patient is not nervous/anxious.     Physical Exam Constitutional:      General: He is not in acute distress.    Appearance: He is well-developed. He is not diaphoretic.  Neck:     Thyroid: Thyroid mass and  thyromegaly present.  Cardiovascular:     Rate and Rhythm: Normal rate and regular rhythm.     Pulses: Normal pulses.     Heart sounds: Normal heart sounds.  Pulmonary:     Effort: Pulmonary effort is normal. No respiratory distress.     Breath sounds: Normal breath sounds.  Abdominal:     General: Bowel sounds are normal. There is no distension.     Palpations: Abdomen is soft.     Tenderness: There is no abdominal tenderness.  Musculoskeletal:     Cervical back: Neck supple.     Comments: Left hemiplegia   Lymphadenopathy:     Cervical: No cervical adenopathy.  Skin:    General: Skin is warm and dry.  Neurological:     Mental Status: He is alert. Mental status is at baseline.  Psychiatric:        Mood and Affect: Mood normal.       ASSESSMENT/ PLAN:  TODAY;   Hypertension associated with type 2 diabetes mellitus Moderate vascular dementia with psychosis Seizures  Will continue current medications Will continue current plan of care Will continue to monitor his status.   Time spent with patient: 40 minutes: medication; health status; plan of care     Ok Edwards NP [facility]   call [phone]

## 2022-07-11 ENCOUNTER — Other Ambulatory Visit: Payer: Self-pay | Admitting: Adult Health

## 2022-07-13 ENCOUNTER — Ambulatory Visit: Payer: Medicaid Other | Admitting: "Endocrinology

## 2022-07-13 ENCOUNTER — Non-Acute Institutional Stay (SKILLED_NURSING_FACILITY): Payer: Medicaid Other | Admitting: Internal Medicine

## 2022-07-13 ENCOUNTER — Encounter: Payer: Self-pay | Admitting: Internal Medicine

## 2022-07-13 DIAGNOSIS — E1149 Type 2 diabetes mellitus with other diabetic neurological complication: Secondary | ICD-10-CM

## 2022-07-13 DIAGNOSIS — M25561 Pain in right knee: Secondary | ICD-10-CM

## 2022-07-13 DIAGNOSIS — D509 Iron deficiency anemia, unspecified: Secondary | ICD-10-CM | POA: Diagnosis not present

## 2022-07-13 DIAGNOSIS — E785 Hyperlipidemia, unspecified: Secondary | ICD-10-CM

## 2022-07-13 DIAGNOSIS — E042 Nontoxic multinodular goiter: Secondary | ICD-10-CM | POA: Diagnosis not present

## 2022-07-13 DIAGNOSIS — E1169 Type 2 diabetes mellitus with other specified complication: Secondary | ICD-10-CM | POA: Diagnosis not present

## 2022-07-13 NOTE — Progress Notes (Signed)
NURSING HOME LOCATION:  [facility] ROOM NUMBER:  113 D  CODE STATUS:  DNR  PCP:  Ok Edwards NP  This is a nursing facility follow up visit of chronic medical diagnoses & to document compliance with Regulation 483.30 (c) in [employer] Manual Phase 2 which mandates caregiver visit ( visits can alternate among physician, PA or NP as per statutes) within 10 days of 30 days / 60 days/ 90 days post admission to SNF date    Interim medical record and care since last SNF visit was updated with review of diagnostic studies and change in clinical status since last visit were documented.  HPI: He is a permanent resident of facility with medical diagnoses of diabetes with vascular complications, history of seizure disorder, history of stroke, essential hypertension, PAF, GERD, pseudobulbar affect, protein/caloric malnutrition, and history of multinodular goiter.  Labs are current and reveal a total protein of 6 and albumin of 2.5 compatible with protein/caloric malnutrition.  CKD stage II is present with creatinine of 0.87 and GFR greater than 60.  LDL is at goal at goal of less than 70 with a value of 68.  There is been a dramatic improvement in his A1c with current value of 5.3% and a value of 7.5% in August of this year.  There is been slight progression of hypochromic, normocytic anemia with H/H dropping from 11.3/38.7 down to current values of 10/34.9.  TSH is current and therapeutic with a value of 1.238 which is serially is stable.  Review of systems: He stated "I am doing great, my blood work is perfect."  When I informed him that there had been slight progression of his anemia; he denied any bleeding dyscrasias.  He does describe some dysphagia on the left side of his oropharynx.  He states that he is "hungry all the time."  He has numbness and tingling in the right hand especially at night.  He feels that his thyroid is getting bigger and he needs his thyroid  test updated.  I informed him that these are current and therapeutic.  He describes dyspnea when he turns to the right lateral decubitus position.  He has chronic constipation and is requesting something "to clean me out."  He does state that he snores and wakes himself.  He denies apnea.  He describes pain in the right knee.  Constitutional: No fever, significant weight change, fatigue  Eyes: No redness, discharge, pain, vision change ENT/mouth: No nasal congestion,  purulent discharge, earache, change in hearing, sore throat  Cardiovascular: No chest pain, palpitations, paroxysmal nocturnal dyspnea, claudication, edema  Respiratory: No cough, sputum production, hemoptysis, DOE, significant snoring, apnea   Gastrointestinal: No heartburn, dysphagia, abdominal pain, nausea /vomiting, rectal bleeding, melena, change in bowels Genitourinary: No dysuria, hematuria, pyuria, incontinence, nocturia Musculoskeletal: No joint stiffness, joint swelling, weakness, pain Dermatologic: No rash, pruritus, change in appearance of skin Neurologic: No dizziness, headache, syncope, seizures, numbness, tingling Psychiatric: No significant anxiety, depression, insomnia, anorexia Endocrine: No change in hair/skin/nails, excessive thirst, excessive hunger, excessive urination  Hematologic/lymphatic: No significant bruising, lymphadenopathy, abnormal bleeding Allergy/immunology: No itchy/watery eyes, significant sneezing, urticaria, angioedema  Physical exam:  Pertinent or positive findings:he is wheelchair bound due to L hemiparesis.He exhibits a baby talk type vocalization.  Surgical deficit is present over the right crown.  The maxilla is edentulous.  The mandibular teeth are in poor repair with pigmentation and dense calculus formation.  Large right goiter is present without nodule  character.  He exhibits a slight tachycardia.  Breath sounds are decreased.  Pedal pulses are decreased.  As noted he has left  hemiparesis.  The left hand is clawed.  There is crepitus at the right knee with range of motion testing.  There is no effusion.  General appearance: Adequately nourished; no acute distress, increased work of breathing is present.   Lymphatic: No lymphadenopathy about the head, neck, axilla. Eyes: No conjunctival inflammation or lid edema is present. There is no scleral icterus. Ears:  External ear exam shows no significant lesions or deformities.   Nose:  External nasal examination shows no deformity or inflammation. Nasal mucosa are pink and moist without lesions, exudates Oral exam:  Lips and gums are healthy appearing. There is no oropharyngeal erythema or exudate. Neck:  No thyromegaly, masses, tenderness noted.    Heart:  Normal rate and regular rhythm. S1 and S2 normal without gallop, murmur, click, rub .  Lungs: Chest clear to auscultation without wheezes, rhonchi, rales, rubs. Abdomen: Bowel sounds are normal. Abdomen is soft and nontender with no organomegaly, hernias, masses. GU: Deferred  Extremities:  No cyanosis, clubbing, edema  Neurologic exam : Cn 2-7 intact Strength equal  in upper & lower extremities Balance, Rhomberg, finger to nose testing could not be completed due to clinical state Deep tendon reflexes are equal Skin: Warm & dry w/o tenting. No significant lesions or rash.  See summary under each active problem in the Problem List with associated updated therapeutic plan

## 2022-07-13 NOTE — Assessment & Plan Note (Signed)
There is been slight progression of the hypochromic, normocytic anemia with H/H dropping from 11.3/38.7 down to for current values of 10/34.9.  No bleeding dyscrasias reported; continue to monitor.

## 2022-07-13 NOTE — Assessment & Plan Note (Signed)
LDL is at goal with a value of 68.  No change in present statin or dosage indicated.  Monitor annually.

## 2022-07-13 NOTE — Assessment & Plan Note (Addendum)
He believes his goiter is enlarging; but TSH is current and therapeutic with a value of 1.238. It is stable serially.

## 2022-07-13 NOTE — Assessment & Plan Note (Addendum)
There has been a dramatic improvement in his A1c with current values of 5.3%, down from prior value of 7.5%.  He is on oral semaglutide and metformin as he refuses any IM injections.

## 2022-07-13 NOTE — Patient Instructions (Signed)
See assessment and plan under each diagnosis in the problem list and acutely for this visit 

## 2022-07-26 ENCOUNTER — Non-Acute Institutional Stay (SKILLED_NURSING_FACILITY): Payer: Medicaid Other | Admitting: Adult Health

## 2022-07-26 ENCOUNTER — Encounter: Payer: Self-pay | Admitting: Adult Health

## 2022-07-26 DIAGNOSIS — E042 Nontoxic multinodular goiter: Secondary | ICD-10-CM | POA: Diagnosis not present

## 2022-07-26 NOTE — Progress Notes (Signed)
Location:  [place] Room Number: 113 D Place of Service:  SNF (31)   CODE STATUS: DNR  Allergies  Allergen Reactions   Cheese    Penicillins Hives    Has patient had a PCN reaction causing immediate rash, facial/tongue/throat swelling, SOB or lightheadedness with hypotension: Unknown Has patient had a PCN reaction causing severe rash involving mucus membranes or skin necrosis: Unknown Has patient had a PCN reaction that required hospitalization: No Has patient had a PCN reaction occurring within the last 10 years: No If all of the above answers are "NO", then may proceed with Cephalosporin use.     Chief Complaint  Patient presents with   Acute Visit    Goiter,      HPI:  He tells me that his goiter is getter larger. He denies any difficulty swallowing on coughing present. States that he can feel the goiter "on the inside".   Past Medical History:  Diagnosis Date   DM (diabetes mellitus) ([place])    History of seizures    Hypertension    Stroke [facility])     Past Surgical History:  Procedure Laterality Date   CRANIECTOMY Right 04/01/2017   Procedure: RIGHT DECOMPRESSIVE CRANIECTOMY;  Surgeon: Ditty, Kevan Ny, MD;  Location: [place];  Service: Neurosurgery;  Laterality: Right;   ESOPHAGOGASTRODUODENOSCOPY N/A 04/13/2017   Procedure: ESOPHAGOGASTRODUODENOSCOPY (EGD);  Surgeon: Georganna Skeans, MD;  Location: [place];  Service: General;  Laterality: N/A;  bedside   INCISION AND DRAINAGE PERIRECTAL ABSCESS N/A 09/28/2017   Procedure: IRRIGATION AND DEBRIDEMENT PERIANAL ABSCESS;  Surgeon: Virl Cagey, MD;  Location: [facility];  Service: General;  Laterality: N/A;   PEG PLACEMENT N/A 04/13/2017   Procedure: PERCUTANEOUS ENDOSCOPIC GASTROSTOMY (PEG) PLACEMENT;  Surgeon: Georganna Skeans, MD;  Location: [facility] ENDOSCOPY;  Service: General;  Laterality: N/A;    Social History   Socioeconomic History   Marital status: Unknown    Spouse name: Not on  file   Number of children: Not on file   Years of education: Not on file   Highest education level: Not on file  Occupational History   Not on file  Tobacco Use   Smoking status: Former    Types: Cigarettes   Smokeless tobacco: Never   Tobacco comments:    UTA  Vaping Use   Vaping Use: Never used  Substance and Sexual Activity   Alcohol use: No    Comment: UTA   Drug use: No    Comment: UTA   Sexual activity: Not Currently    Birth control/protection: None  Other Topics Concern   Not on file  Social History Narrative   Not on file   Social Determinants of Health   Financial Resource Strain: Not on file  Food Insecurity: Not on file  Transportation Needs: Not on file  Physical Activity: Not on file  Stress: Not on file  Social Connections: Not on file  Intimate Partner Violence: Not on file   Family History  Problem Relation Age of Onset   Hypertension Father       VITAL SIGNS BP (!) 94/43   Pulse 92   Ht '5\' 6"'$  (1.676 m)   Wt 177 lb 9.6 oz (80.6 kg)   BMI 28.67 kg/m   Outpatient Encounter Medications as of 07/26/2022  Medication Sig   acetaminophen (TYLENOL) 325 MG tablet Take 650 mg by mouth as directed. Every 4-6 hours as needed, not to exceed 3 grams in 24 hours  apixaban (ELIQUIS) 5 MG TABS tablet Take 5 mg by mouth 2 (two) times daily.    atorvastatin (LIPITOR) 20 MG tablet Take 20 mg by mouth at bedtime.   Cholecalciferol 1.25 MG (50000 UT) capsule Take 50,000 Units by mouth once a week. For Low Vitamin D Level on Monday   Dextromethorphan-quiNIDine (NUEDEXTA) 20-10 MG capsule Take 1 capsule by mouth 2 (two) times daily.   docusate (COLACE) 50 MG/5ML liquid Take 100 mg by mouth at bedtime.   Ensure Max Protein (ENSURE MAX PROTEIN) LIQD Take 11 oz by mouth daily. 9 am   gabapentin (NEURONTIN) 100 MG capsule Take 100 mg by mouth at bedtime.   levETIRAcetam (KEPPRA XR) 500 MG 24 hr tablet Take 500 mg by mouth daily. At bedtime 8 pm   Menthol, Topical  Analgesic, (BIOFREEZE) 4 % GEL Apply 1 application  topically at bedtime as needed. Apply to left shoulder   metFORMIN (GLUCOPHAGE-XR) 500 MG 24 hr tablet Take 500 mg by mouth daily with breakfast.   NON FORMULARY Diet:Regular   omeprazole (PRILOSEC) 40 MG capsule Take 40 mg by mouth daily.    polyethylene glycol (MIRALAX / GLYCOLAX) 17 g packet Take 17 g by mouth daily. 9 pm   QUEtiapine (SEROQUEL) 100 MG tablet Take 100 mg by mouth 2 (two) times daily.   Semaglutide (RYBELSUS) 7 MG TABS Take 7 mg by mouth daily at 2 PM.   No facility-administered encounter medications on file as of 07/26/2022.     SIGNIFICANT DIAGNOSTIC EXAMS  PREVIOUS   06-10-21: thyroid biopsy   NO NEW EXAMS.   LABS REVIEWED PREVIOUS:     08-06-21: wbc 10.0; hgb 10.8 hct 37.8; mcv 76.8 plt 249; glucose 118; bun 11; creat 0.77; k+ 3.1; na++ 136; ca 8.5 GFR>60 08-13-21: wbc 11.9; hgb 10.3; hct 36.2; mcv 77.4 plt 251; glucose 69; bun 8; creat 0.69; k+ 3.2; na++ 140; ca 8.3 GFR >60 10-21-21: glucose 133; bun 9; creat 0.67; k+ 4.0; na++ 139; ca 8.7; protein 6.1; albumin 2.6; hgb a1c 5.7; chol 99; ldl 49; trig 104; hdl 29 01-06-22: PSA 2.14 01-19-22: wbc 10.8; hgb 11.3; hct 38.7; mcv 75.6 plt 214; glucose 251; bun 11; creat 0.69; k+ 3.6; na++ 135; ca 8.6; gfr >60; protein 6.8; albumin 2.9 keppra 13.2 lactic acid 0.9 tsh 1.318 02-10-22: vitamin D 43.68  03-14-22; hgb a1c 5.7 03-28-22: glucose 336; bun 7; creat 0.61; k+ 3.6; na++ 137; ca 8.9; gfr>60 06-24-22: hgb A1c 7.5  NO NEW LABS.    Review of Systems  Constitutional:  Negative for malaise/fatigue.  HENT:         Goiter is getting "bigger"  Respiratory:  Negative for cough and shortness of breath.   Cardiovascular:  Negative for chest pain, palpitations and leg swelling.  Gastrointestinal:  Negative for abdominal pain, constipation and heartburn.  Musculoskeletal:  Negative for back pain, joint pain and myalgias.  Skin: Negative.   Neurological:  Negative for  dizziness.  Psychiatric/Behavioral:  The patient is not nervous/anxious.    Physical Exam Constitutional:      General: He is not in acute distress.    Appearance: He is well-developed. He is not diaphoretic.  Neck:     Thyroid: No thyromegaly.     Comments: Thyroid goiter is larger on the right side  Cardiovascular:     Rate and Rhythm: Normal rate and regular rhythm.     Pulses: Normal pulses.     Heart sounds: Normal heart sounds.  Pulmonary:  Effort: Pulmonary effort is normal. No respiratory distress.     Breath sounds: Normal breath sounds.  Abdominal:     General: Bowel sounds are normal. There is no distension.     Palpations: Abdomen is soft.     Tenderness: There is no abdominal tenderness.  Musculoskeletal:     Cervical back: Neck supple.     Right lower leg: No edema.     Left lower leg: No edema.     Comments: Left hemiparesis   Lymphadenopathy:     Cervical: No cervical adenopathy.  Skin:    General: Skin is warm and dry.  Neurological:     Mental Status: He is alert. Mental status is at baseline.  Psychiatric:        Mood and Affect: Mood normal.        ASSESSMENT/ PLAN:   TODAY  Multinodular goiter: is worse; will get ultrasound; tsh free t4; total f3; antibodies. Will have him return to endocrinology.     Ok Edwards NP [facility]  call [phone]

## 2022-07-28 ENCOUNTER — Other Ambulatory Visit (HOSPITAL_COMMUNITY)
Admission: RE | Admit: 2022-07-28 | Discharge: 2022-07-28 | Disposition: A | Discharge status: Home / Self Care | Payer: Medicaid Other | Source: Skilled Nursing Facility | Attending: Adult Health | Admitting: Adult Health

## 2022-07-28 DIAGNOSIS — E069 Thyroiditis, unspecified: Secondary | ICD-10-CM | POA: Insufficient documentation

## 2022-07-28 LAB — TSH: TSH: 3.168 u[IU]/mL (ref 0.350–4.500)

## 2022-07-29 ENCOUNTER — Ambulatory Visit (HOSPITAL_COMMUNITY)
Admission: RE | Admit: 2022-07-29 | Discharge: 2022-07-29 | Disposition: A | Discharge status: Home / Self Care | Payer: Medicaid Other | Source: Ambulatory Visit | Attending: Internal Medicine | Admitting: Internal Medicine

## 2022-07-29 DIAGNOSIS — E049 Nontoxic goiter, unspecified: Secondary | ICD-10-CM | POA: Insufficient documentation

## 2022-07-30 ENCOUNTER — Encounter (HOSPITAL_COMMUNITY)
Admission: RE | Admit: 2022-07-30 | Discharge: 2022-07-30 | Disposition: A | Discharge status: Home / Self Care | Payer: Medicaid Other | Source: Skilled Nursing Facility | Attending: Adult Health | Admitting: Adult Health

## 2022-07-30 DIAGNOSIS — E069 Thyroiditis, unspecified: Secondary | ICD-10-CM | POA: Diagnosis present

## 2022-07-30 LAB — T4, FREE: Free T4: 0.88 ng/dL (ref 0.61–1.12)

## 2022-07-30 LAB — TSH: TSH: 1.24 u[IU]/mL (ref 0.350–4.500)

## 2022-08-02 LAB — T3: T3, Total: 118 ng/dL (ref 71–180)

## 2022-08-24 ENCOUNTER — Encounter: Payer: Self-pay | Admitting: Adult Health

## 2022-08-24 ENCOUNTER — Non-Acute Institutional Stay (SKILLED_NURSING_FACILITY): Payer: Medicaid Other | Admitting: Adult Health

## 2022-08-24 DIAGNOSIS — I480 Paroxysmal atrial fibrillation: Secondary | ICD-10-CM | POA: Diagnosis not present

## 2022-08-24 DIAGNOSIS — F22 Delusional disorders: Secondary | ICD-10-CM

## 2022-08-24 DIAGNOSIS — I152 Hypertension secondary to endocrine disorders: Secondary | ICD-10-CM

## 2022-08-24 DIAGNOSIS — E1159 Type 2 diabetes mellitus with other circulatory complications: Secondary | ICD-10-CM | POA: Diagnosis not present

## 2022-08-24 DIAGNOSIS — F482 Pseudobulbar affect: Secondary | ICD-10-CM

## 2022-08-24 NOTE — Progress Notes (Unsigned)
Location:  [place] Room Number: 113D Place of Service:  SNF (31)   CODE STATUS: DNR  Allergies  Allergen Reactions   Cheese    Penicillins Hives    Has patient had a PCN reaction causing immediate rash, facial/tongue/throat swelling, SOB or lightheadedness with hypotension: Unknown Has patient had a PCN reaction causing severe rash involving mucus membranes or skin necrosis: Unknown Has patient had a PCN reaction that required hospitalization: No Has patient had a PCN reaction occurring within the last 10 years: No If all of the above answers are "NO", then may proceed with Cephalosporin use.     Chief Complaint  Patient presents with   Medical Management of Chronic Issues                       Hypertension associated with type 2 diabetes mellitus:  Delusions/paranoia: PAF (paroxsymal atrial fibrillation)  PBA    HPI:  Joe Wilkinson is a 62 year old long term resident of this facility is being seen for the management of his chronic illnesses: Hypertension associated with type 2 diabetes mellitus:  Delusions/paranoia: PAF (paroxsymal atrial fibrillation)  PBA. There are no reports of uncontrolled pain. Joe Wilkinson does get out of bed; will propel self throughout unit. His weight is stable.   Past Medical History:  Diagnosis Date   DM (diabetes mellitus) ([place])    History of seizures    Hypertension    Stroke [facility])     Past Surgical History:  Procedure Laterality Date   CRANIECTOMY Right 04/01/2017   Procedure: RIGHT DECOMPRESSIVE CRANIECTOMY;  Surgeon: Ditty, Kevan Ny, MD;  Location: [place];  Service: Neurosurgery;  Laterality: Right;   ESOPHAGOGASTRODUODENOSCOPY N/A 04/13/2017   Procedure: ESOPHAGOGASTRODUODENOSCOPY (EGD);  Surgeon: Georganna Skeans, MD;  Location: [place];  Service: General;  Laterality: N/A;  bedside   INCISION AND DRAINAGE PERIRECTAL ABSCESS N/A 09/28/2017   Procedure: IRRIGATION AND DEBRIDEMENT PERIANAL ABSCESS;  Surgeon: Virl Cagey, MD;  Location: [facility];  Service: General;  Laterality: N/A;   PEG PLACEMENT N/A 04/13/2017   Procedure: PERCUTANEOUS ENDOSCOPIC GASTROSTOMY (PEG) PLACEMENT;  Surgeon: Georganna Skeans, MD;  Location: [facility] ENDOSCOPY;  Service: General;  Laterality: N/A;    Social History   Socioeconomic History   Marital status: Unknown    Spouse name: Not on file   Number of children: Not on file   Years of education: Not on file   Highest education level: Not on file  Occupational History   Not on file  Tobacco Use   Smoking status: Former    Types: Cigarettes   Smokeless tobacco: Never   Tobacco comments:    UTA  Vaping Use   Vaping Use: Never used  Substance and Sexual Activity   Alcohol use: No    Comment: UTA   Drug use: No    Comment: UTA   Sexual activity: Not Currently    Birth control/protection: None  Other Topics Concern   Not on file  Social History Narrative   Not on file   Social Determinants of Health   Financial Resource Strain: Not on file  Food Insecurity: Not on file  Transportation Needs: Not on file  Physical Activity: Not on file  Stress: Not on file  Social Connections: Not on file  Intimate Partner Violence: Not on file   Family History  Problem Relation Age of Onset   Hypertension Father       VITAL SIGNS BP 108/64  Pulse 78   Temp (!) 97.1 F (36.2 C)   Resp 20   Ht '5\' 6"'$  (1.676 m)   Wt 180 lb (81.6 kg)   SpO2 96%   BMI 29.05 kg/m   Outpatient Encounter Medications as of 08/24/2022  Medication Sig   acetaminophen (TYLENOL) 325 MG tablet Take 650 mg by mouth as directed. Every 4-6 hours as needed, not to exceed 3 grams in 24 hours   apixaban (ELIQUIS) 5 MG TABS tablet Take 5 mg by mouth 2 (two) times daily.    atorvastatin (LIPITOR) 20 MG tablet Take 20 mg by mouth at bedtime.   Cholecalciferol 1.25 MG (50000 UT) capsule Take 50,000 Units by mouth once a week. For Low Vitamin D Level on Monday   Dextromethorphan-quiNIDine (NUEDEXTA) 20-10 MG  capsule Take 1 capsule by mouth 2 (two) times daily.   docusate (COLACE) 50 MG/5ML liquid Take 100 mg by mouth at bedtime.   Ensure Max Protein (ENSURE MAX PROTEIN) LIQD Take 11 oz by mouth daily. 9 am   gabapentin (NEURONTIN) 100 MG capsule Take 100 mg by mouth at bedtime.   levETIRAcetam (KEPPRA XR) 500 MG 24 hr tablet Take 500 mg by mouth daily. At bedtime 8 pm   Menthol, Topical Analgesic, (BIOFREEZE) 4 % GEL Apply 1 application  topically at bedtime as needed. Apply to left shoulder   metFORMIN (GLUCOPHAGE-XR) 500 MG 24 hr tablet Take 500 mg by mouth daily with breakfast.   NON FORMULARY Diet:Regular   omeprazole (PRILOSEC) 40 MG capsule Take 40 mg by mouth daily.    polyethylene glycol (MIRALAX / GLYCOLAX) 17 g packet Take 17 g by mouth daily. 9 pm   QUEtiapine (SEROQUEL) 100 MG tablet Take 100 mg by mouth 2 (two) times daily.   Semaglutide (RYBELSUS) 7 MG TABS Take 7 mg by mouth daily at 2 PM.   No facility-administered encounter medications on file as of 08/24/2022.     SIGNIFICANT DIAGNOSTIC EXAMS  PREVIOUS   06-10-21: thyroid biopsy   NO NEW EXAMS.   LABS REVIEWED PREVIOUS:     08-13-21: wbc 11.9; hgb 10.3; hct 36.2; mcv 77.4 plt 251; glucose 69; bun 8; creat 0.69; k+ 3.2; na++ 140; ca 8.3 GFR >60 10-21-21: glucose 133; bun 9; creat 0.67; k+ 4.0; na++ 139; ca 8.7; protein 6.1; albumin 2.6; hgb a1c 5.7; chol 99; ldl 49; trig 104; hdl 29 01-06-22: PSA 2.14 01-19-22: wbc 10.8; hgb 11.3; hct 38.7; mcv 75.6 plt 214; glucose 251; bun 11; creat 0.69; k+ 3.6; na++ 135; ca 8.6; gfr >60; protein 6.8; albumin 2.9 keppra 13.2 lactic acid 0.9 tsh 1.318 02-10-22: vitamin D 43.68  03-14-22; hgb a1c 5.7 03-28-22: glucose 336; bun 7; creat 0.61; k+ 3.6; na++ 137; ca 8.9; gfr>60 06-24-22: wbc 9.2; hgb 10.0; hct 34.9; mcv 87.7 plt 241; glucose 145; bun 8; creat 0.67; k+ 3.6; na++ 140; ca 8.5; gfr >60; protein 6.0; albumin 2.5; vitamin D 43.99; chol 116; ldl 68; trig 78; hdl 32; tsh 1.238 free t4: 0.79  hgb A1c 7.5 07-30-22: tsh 1.240 free t4: 0.88 t3: 118  NO NEW LABS.    Review of Systems  Constitutional:  Negative for malaise/fatigue.  Respiratory:  Negative for cough and shortness of breath.   Cardiovascular:  Negative for chest pain, palpitations and leg swelling.  Gastrointestinal:  Negative for abdominal pain, constipation and heartburn.  Musculoskeletal:  Negative for back pain, joint pain and myalgias.  Skin: Negative.   Neurological:  Negative for dizziness.  Psychiatric/Behavioral:  The patient is not nervous/anxious.    Physical Exam Constitutional:      General: Joe Wilkinson is not in acute distress.    Appearance: Joe Wilkinson is well-developed. Joe Wilkinson is not diaphoretic.  Neck:     Thyroid: Thyroid mass and thyromegaly present.     Comments: Thyroid goiter is larger on the right side  Cardiovascular:   Cardiovascular:     Rate and Rhythm: Normal rate and regular rhythm.     Pulses: Normal pulses.     Heart sounds: Normal heart sounds.  Pulmonary:     Effort: Pulmonary effort is normal. No respiratory distress.     Breath sounds: Normal breath sounds.  Abdominal:     General: Bowel sounds are normal. There is no distension.     Palpations: Abdomen is soft.     Tenderness: There is no abdominal tenderness.  Musculoskeletal:     Cervical back: Neck supple.     Right lower leg: No edema.     Left lower leg: No edema.     Comments: Left hemiparesis  Lymphadenopathy:     Cervical: No cervical adenopathy.  Skin:    General: Skin is warm and dry.  Neurological:     Mental Status: Joe Wilkinson is alert. Mental status is at baseline.  Psychiatric:        Mood and Affect: Mood normal.     ASSESSMENT/ PLAN:  TODAY;   Hypertension associated with type 2 diabetes mellitus: b/p 108/64: is off medications  2. Delusions/paranoia: will continue seroquel 100 mg twice daily   3. PAF (paroxsymal atrial fibrillation) heart rate is stable will continue eliquis 5 mg twice daily   4. PBA has less  verbal outbursts; will continue nuedexta twice daily    PREVIOUS   5. Hypochromic anemia: hgb 10.0 will monitor   6. Protein calorie malnutrition severe; albumin 2.5 has declined prostat  7. Dyslipidemia associated with type 2 diabetes mellitus: LDL 68 will continue lipitor 20 mg daily   8. Diabetic peripheral neuropathy: will continue gabapentin 100 mg nightly   9. Seizure: without recent activity: will continue keppra xl 500 mg daily   10. Chronic constipation: will continue colace twice daily   11. 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 Will continue elequis 5 mg twice daily   12. Vitamin D deficiency: is 43.99 will continue vitamin D 50,000 units weekly   13. 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 GERD without esophagitis: will continue prilosec 40 mg daily   15. Multinodular goiter: is status post aspiration; awaiting endocrinology appointment   26. Type 2 diabetes mellitus with neurological complications: hgb T0W 7.5 will continue metformin xr 500 mg daily and rybelsis 3 mg daily   Ok Edwards NP [facility]  call [phone]

## 2022-08-31 ENCOUNTER — Other Ambulatory Visit (HOSPITAL_COMMUNITY)
Admission: RE | Admit: 2022-08-31 | Discharge: 2022-08-31 | Disposition: A | Discharge status: Home / Self Care | Payer: Medicaid Other | Source: Skilled Nursing Facility | Attending: Adult Health | Admitting: Adult Health

## 2022-08-31 DIAGNOSIS — E069 Thyroiditis, unspecified: Secondary | ICD-10-CM | POA: Insufficient documentation

## 2022-08-31 LAB — T4, FREE: Free T4: 0.82 ng/dL (ref 0.61–1.12)

## 2022-08-31 LAB — TSH: TSH: 2.255 u[IU]/mL (ref 0.350–4.500)

## 2022-09-02 LAB — T3, FREE: T3, Free: 2.8 pg/mL (ref 2.0–4.4)

## 2022-09-05 ENCOUNTER — Ambulatory Visit: Payer: Medicaid Other | Admitting: "Endocrinology

## 2022-09-26 ENCOUNTER — Encounter: Payer: Self-pay | Admitting: Adult Health

## 2022-09-26 ENCOUNTER — Non-Acute Institutional Stay (SKILLED_NURSING_FACILITY): Payer: Medicaid Other | Admitting: Adult Health

## 2022-09-26 DIAGNOSIS — E1142 Type 2 diabetes mellitus with diabetic polyneuropathy: Secondary | ICD-10-CM | POA: Diagnosis not present

## 2022-09-26 DIAGNOSIS — E785 Hyperlipidemia, unspecified: Secondary | ICD-10-CM

## 2022-09-26 DIAGNOSIS — D509 Iron deficiency anemia, unspecified: Secondary | ICD-10-CM | POA: Diagnosis not present

## 2022-09-26 DIAGNOSIS — E43 Unspecified severe protein-calorie malnutrition: Secondary | ICD-10-CM

## 2022-09-26 DIAGNOSIS — E1169 Type 2 diabetes mellitus with other specified complication: Secondary | ICD-10-CM

## 2022-09-26 NOTE — Progress Notes (Signed)
Location:  [place]   Place of Service:   SNF    CODE STATUS: dnr   Allergies  Allergen Reactions   Cheese    Penicillins Hives    Has patient had a PCN reaction causing immediate rash, facial/tongue/throat swelling, SOB or lightheadedness with hypotension: Unknown Has patient had a PCN reaction causing severe rash involving mucus membranes or skin necrosis: Unknown Has patient had a PCN reaction that required hospitalization: No Has patient had a PCN reaction occurring within the last 10 years: No If all of the above answers are "NO", then may proceed with Cephalosporin use.     Chief Complaint  Patient presents with   Medical Management of Chronic Issues                 Hypochromic anemia:  Protein calorie malnutrition severe; Dyslipidemia associated with type 2 diabetes mellitus:  Diabetic peripheral neuropathy     HPI:  He is a 62 year old long term resident being seen for the management of his chronic illnesses including:   Hypochromic anemia:  Protein calorie malnutrition severe; Dyslipidemia associated with type 2 diabetes mellitus:  Diabetic peripheral neuropathy.  He is getting out of bed daily.  There are no reports of uncontrolled pain. There are no reports of agitation present.   Past Medical History:  Diagnosis Date   DM (diabetes mellitus) ([place])    History of seizures    Hypertension    Stroke [facility])     Past Surgical History:  Procedure Laterality Date   CRANIECTOMY Right 04/01/2017   Procedure: RIGHT DECOMPRESSIVE CRANIECTOMY;  Surgeon: Ditty, Kevan Ny, MD;  Location: [place];  Service: Neurosurgery;  Laterality: Right;   ESOPHAGOGASTRODUODENOSCOPY N/A 04/13/2017   Procedure: ESOPHAGOGASTRODUODENOSCOPY (EGD);  Surgeon: Georganna Skeans, MD;  Location: [place];  Service: General;  Laterality: N/A;  bedside   INCISION AND DRAINAGE PERIRECTAL ABSCESS N/A 09/28/2017   Procedure: IRRIGATION AND DEBRIDEMENT PERIANAL ABSCESS;  Surgeon: Virl Cagey, MD;  Location: [facility];  Service: General;  Laterality: N/A;   PEG PLACEMENT N/A 04/13/2017   Procedure: PERCUTANEOUS ENDOSCOPIC GASTROSTOMY (PEG) PLACEMENT;  Surgeon: Georganna Skeans, MD;  Location: [facility] ENDOSCOPY;  Service: General;  Laterality: N/A;    Social History   Socioeconomic History   Marital status: Unknown    Spouse name: Not on file   Number of children: Not on file   Years of education: Not on file   Highest education level: Not on file  Occupational History   Not on file  Tobacco Use   Smoking status: Former    Types: Cigarettes   Smokeless tobacco: Never   Tobacco comments:    UTA  Vaping Use   Vaping Use: Never used  Substance and Sexual Activity   Alcohol use: No    Comment: UTA   Drug use: No    Comment: UTA   Sexual activity: Not Currently    Birth control/protection: None  Other Topics Concern   Not on file  Social History Narrative   Not on file   Social Determinants of Health   Financial Resource Strain: Not on file  Food Insecurity: Not on file  Transportation Needs: Not on file  Physical Activity: Not on file  Stress: Not on file  Social Connections: Not on file  Intimate Partner Violence: Not on file   Family History  Problem Relation Age of Onset   Hypertension Father       VITAL SIGNS BP  126/72   Pulse 78   Temp 98.1 F (36.7 C)   Resp 18   Ht '5\' 6"'$  (1.676 m)   Wt 178 lb 6.4 oz (80.9 kg)   SpO2 96%   BMI 28.79 kg/m   Outpatient Encounter Medications as of 09/26/2022  Medication Sig   acetaminophen (TYLENOL) 325 MG tablet Take 650 mg by mouth as directed. Every 4-6 hours as needed, not to exceed 3 grams in 24 hours   apixaban (ELIQUIS) 5 MG TABS tablet Take 5 mg by mouth 2 (two) times daily.    atorvastatin (LIPITOR) 20 MG tablet Take 20 mg by mouth at bedtime.   Cholecalciferol 1.25 MG (50000 UT) capsule Take 50,000 Units by mouth once a week. For Low Vitamin D Level on Monday   Dextromethorphan-quiNIDine  (NUEDEXTA) 20-10 MG capsule Take 1 capsule by mouth 2 (two) times daily.   docusate (COLACE) 50 MG/5ML liquid Take 100 mg by mouth at bedtime.   Ensure Max Protein (ENSURE MAX PROTEIN) LIQD Take 11 oz by mouth daily. 9 am   gabapentin (NEURONTIN) 100 MG capsule Take 100 mg by mouth at bedtime.   levETIRAcetam (KEPPRA XR) 500 MG 24 hr tablet Take 500 mg by mouth daily. At bedtime 8 pm   Menthol, Topical Analgesic, (BIOFREEZE) 4 % GEL Apply 1 application  topically at bedtime as needed. Apply to left shoulder   metFORMIN (GLUCOPHAGE-XR) 500 MG 24 hr tablet Take 500 mg by mouth daily with breakfast.   NON FORMULARY Diet:Regular   omeprazole (PRILOSEC) 40 MG capsule Take 40 mg by mouth daily.    polyethylene glycol (MIRALAX / GLYCOLAX) 17 g packet Take 17 g by mouth daily. 9 pm   QUEtiapine (SEROQUEL) 100 MG tablet Take 100 mg by mouth 2 (two) times daily.   Semaglutide (RYBELSUS) 7 MG TABS Take 7 mg by mouth daily at 2 PM.   No facility-administered encounter medications on file as of 09/26/2022.     SIGNIFICANT DIAGNOSTIC EXAMS  PREVIOUS   06-10-21: thyroid biopsy   NO NEW EXAMS.   LABS REVIEWED PREVIOUS:     10-21-21: glucose 133; bun 9; creat 0.67; k+ 4.0; na++ 139; ca 8.7; protein 6.1; albumin 2.6; hgb a1c 5.7; chol 99; ldl 49; trig 104; hdl 29 01-06-22: PSA 2.14 01-19-22: wbc 10.8; hgb 11.3; hct 38.7; mcv 75.6 plt 214; glucose 251; bun 11; creat 0.69; k+ 3.6; na++ 135; ca 8.6; gfr >60; protein 6.8; albumin 2.9 keppra 13.2 lactic acid 0.9 tsh 1.318 02-10-22: vitamin D 43.68  03-14-22; hgb a1c 5.7 03-28-22: glucose 336; bun 7; creat 0.61; k+ 3.6; na++ 137; ca 8.9; gfr>60 06-24-22: wbc 9.2; hgb 10.0; hct 34.9; mcv 87.7 plt 241; glucose 145; bun 8; creat 0.67; k+ 3.6; na++ 140; ca 8.5; gfr >60; protein 6.0; albumin 2.5; vitamin D 43.99; chol 116; ldl 68; trig 78; hdl 32; tsh 1.238 free t4: 0.79 hgb A1c 7.5 07-30-22: tsh 1.240 free t4: 0.88 t3: 118  TODAY  08-31-22: tsh 2.255 free t3: 2.8;  free t4: 0.82     Review of Systems  Constitutional:  Negative for malaise/fatigue.  Respiratory:  Negative for cough and shortness of breath.   Cardiovascular:  Negative for chest pain, palpitations and leg swelling.  Gastrointestinal:  Negative for abdominal pain, constipation and heartburn.  Musculoskeletal:  Negative for back pain, joint pain and myalgias.  Skin: Negative.   Neurological:  Negative for dizziness.  Psychiatric/Behavioral:  The patient is not nervous/anxious.    Physical  Exam Constitutional:      General: He is not in acute distress.    Appearance: He is well-developed. He is not diaphoretic.  Neck:     Thyroid: Thyroid mass and thyromegaly present.  Cardiovascular:     Rate and Rhythm: Normal rate and regular rhythm.     Pulses: Normal pulses.     Heart sounds: Normal heart sounds.  Pulmonary:     Effort: Pulmonary effort is normal. No respiratory distress.     Breath sounds: Normal breath sounds.  Abdominal:     General: Bowel sounds are normal. There is no distension.     Palpations: Abdomen is soft.     Tenderness: There is no abdominal tenderness.  Musculoskeletal:     Cervical back: Neck supple.     Right lower leg: No edema.     Left lower leg: No edema.     Comments: Left hemiparesis   Lymphadenopathy:     Cervical: No cervical adenopathy.  Skin:    General: Skin is warm and dry.  Neurological:     Mental Status: He is alert. Mental status is at baseline.  Psychiatric:        Mood and Affect: Mood normal.      ASSESSMENT/ PLAN:  TODAY;   Hypochromic anemia: hgb 10.0; will monitor   2. Protein calorie malnutrition severe; albumin 2.5 has declined prostat  3. Dyslipidemia associated with type 2 diabetes mellitus: ldl 68; will continue lipitor 20 mg daily   4. Diabetic peripheral neuropathy: will continue gabapentin 100 mg nightly   PREVIOUS   5. Seizure: without recent activity: will continue keppra xl 500 mg daily   6. Chronic  constipation: will continue colace twice daily   7. 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 Will continue elequis 5 mg twice daily   8. Vitamin D deficiency: is 43.99 will continue vitamin D 50,000 units weekly   9. Hemiplegia affecting left side as late effect of cerebrovascular accident (CVA); is off baclofen at this time   10. GERD without esophagitis: will continue prilosec 40 mg daily   11. Multinodular goiter: is status post aspiration; awaiting endocrinology appointment   1. Type 2 diabetes mellitus with neurological complications: hgb 123456 7.5 will continue metformin xr 500 mg daily and rybelsis 7 mg daily   13. Hypertension associated with type 2 diabetes mellitus: b/p 126/72: is off medications  14. Delusions/paranoia: will continue seroquel 100 mg twice daily   15. PAF (paroxsymal atrial fibrillation) heart rate is stable will continue eliquis 5 mg twice daily   16. PBA has less verbal outbursts; will continue nuedexta twice daily    Ok Edwards NP [facility]  call [phone]

## 2022-09-29 ENCOUNTER — Other Ambulatory Visit (HOSPITAL_COMMUNITY)
Admission: RE | Admit: 2022-09-29 | Discharge: 2022-09-29 | Disposition: A | Discharge status: Home / Self Care | Payer: Medicaid Other | Source: Skilled Nursing Facility | Attending: Adult Health | Admitting: Adult Health

## 2022-09-29 DIAGNOSIS — E1149 Type 2 diabetes mellitus with other diabetic neurological complication: Secondary | ICD-10-CM | POA: Insufficient documentation

## 2022-09-30 ENCOUNTER — Encounter: Payer: Self-pay | Admitting: Adult Health

## 2022-09-30 ENCOUNTER — Non-Acute Institutional Stay (SKILLED_NURSING_FACILITY): Payer: Medicaid Other | Admitting: Adult Health

## 2022-09-30 DIAGNOSIS — I69354 Hemiplegia and hemiparesis following cerebral infarction affecting left non-dominant side: Secondary | ICD-10-CM | POA: Diagnosis not present

## 2022-09-30 DIAGNOSIS — E1159 Type 2 diabetes mellitus with other circulatory complications: Secondary | ICD-10-CM

## 2022-09-30 DIAGNOSIS — I152 Hypertension secondary to endocrine disorders: Secondary | ICD-10-CM | POA: Diagnosis not present

## 2022-09-30 DIAGNOSIS — F01B2 Vascular dementia, moderate, with psychotic disturbance: Secondary | ICD-10-CM

## 2022-09-30 LAB — MICROALBUMIN / CREATININE URINE RATIO
Creatinine, Urine: 103.5 mg/dL
Microalb Creat Ratio: 15 mg/g creat (ref 0–29)
Microalb, Ur: 15.3 ug/mL — ABNORMAL HIGH

## 2022-09-30 NOTE — Progress Notes (Signed)
Location:  [place] Room Number: NO/113/D Place of Service:  SNF (31)   CODE STATUS: dnr   Allergies  Allergen Reactions   Cheese    Penicillins Hives    Has patient had a PCN reaction causing immediate rash, facial/tongue/throat swelling, SOB or lightheadedness with hypotension: Unknown Has patient had a PCN reaction causing severe rash involving mucus membranes or skin necrosis: Unknown Has patient had a PCN reaction that required hospitalization: No Has patient had a PCN reaction occurring within the last 10 years: No If all of the above answers are "NO", then may proceed with Cephalosporin use.     Chief Complaint  Patient presents with   Acute Visit    Care plan meeting     HPI:  We have come together for his care plan meeting.  BIMS 12/15 mood 0/30. He is wheelchair bound with no falls. He requires moderate to max assist with his adls. He is incontinent of his bladder and bowel. Dietary:  regular diet; requires setup for meals; appetite; 50-100% weight is 178 pounds. Therapy: PT: for range of motion lower extremities and transfers max assist for transfers . Activities: is starting to attend activities. He continues to be followed for his chronic illnesses including: Hypertension associated with type 2 diabetes mellitus  Hemiparesis affecting left side as late effect of cerebrovascular accident  Moderate vascular dementia with psychotic disturbance  Past Medical History:  Diagnosis Date   DM (diabetes mellitus) ([place])    History of seizures    Hypertension    Stroke [facility])     Past Surgical History:  Procedure Laterality Date   CRANIECTOMY Right 04/01/2017   Procedure: RIGHT DECOMPRESSIVE CRANIECTOMY;  Surgeon: Ditty, Kevan Ny, MD;  Location: [place];  Service: Neurosurgery;  Laterality: Right;   ESOPHAGOGASTRODUODENOSCOPY N/A 04/13/2017   Procedure: ESOPHAGOGASTRODUODENOSCOPY (EGD);  Surgeon: Georganna Skeans, MD;  Location: [place];   Service: General;  Laterality: N/A;  bedside   INCISION AND DRAINAGE PERIRECTAL ABSCESS N/A 09/28/2017   Procedure: IRRIGATION AND DEBRIDEMENT PERIANAL ABSCESS;  Surgeon: Virl Cagey, MD;  Location: [facility];  Service: General;  Laterality: N/A;   PEG PLACEMENT N/A 04/13/2017   Procedure: PERCUTANEOUS ENDOSCOPIC GASTROSTOMY (PEG) PLACEMENT;  Surgeon: Georganna Skeans, MD;  Location: [facility] ENDOSCOPY;  Service: General;  Laterality: N/A;    Social History   Socioeconomic History   Marital status: Unknown    Spouse name: Not on file   Number of children: Not on file   Years of education: Not on file   Highest education level: Not on file  Occupational History   Not on file  Tobacco Use   Smoking status: Former    Types: Cigarettes   Smokeless tobacco: Never   Tobacco comments:    UTA  Vaping Use   Vaping Use: Never used  Substance and Sexual Activity   Alcohol use: No    Comment: UTA   Drug use: No    Comment: UTA   Sexual activity: Not Currently    Birth control/protection: None  Other Topics Concern   Not on file  Social History Narrative   Not on file   Social Determinants of Health   Financial Resource Strain: Not on file  Food Insecurity: Not on file  Transportation Needs: Not on file  Physical Activity: Not on file  Stress: Not on file  Social Connections: Not on file  Intimate Partner Violence: Not on file   Family History  Problem Relation  Age of Onset   Hypertension Father       VITAL SIGNS BP 126/72   Pulse 78   Temp 98.1 F (36.7 C)   Resp 18   Ht '5\' 6"'$  (1.676 m)   Wt 178 lb 6.4 oz (80.9 kg)   SpO2 96%   BMI 28.79 kg/m   Outpatient Encounter Medications as of 09/30/2022  Medication Sig   acetaminophen (TYLENOL) 325 MG tablet Take 650 mg by mouth as directed. Every 4-6 hours as needed, not to exceed 3 grams in 24 hours   apixaban (ELIQUIS) 5 MG TABS tablet Take 5 mg by mouth 2 (two) times daily.    atorvastatin (LIPITOR) 20 MG tablet Take 20  mg by mouth at bedtime.   Cholecalciferol 1.25 MG (50000 UT) capsule Take 50,000 Units by mouth once a week. For Low Vitamin D Level on Monday   Dextromethorphan-quiNIDine (NUEDEXTA) 20-10 MG capsule Take 1 capsule by mouth 2 (two) times daily.   docusate (COLACE) 50 MG/5ML liquid Take 100 mg by mouth at bedtime.   Ensure Max Protein (ENSURE MAX PROTEIN) LIQD Take 11 oz by mouth daily. 9 am   gabapentin (NEURONTIN) 100 MG capsule Take 100 mg by mouth at bedtime.   levETIRAcetam (KEPPRA XR) 500 MG 24 hr tablet Take 500 mg by mouth daily. At bedtime 8 pm   Menthol, Topical Analgesic, (BIOFREEZE) 4 % GEL Apply 1 application  topically at bedtime as needed. Apply to left shoulder   metFORMIN (GLUCOPHAGE-XR) 500 MG 24 hr tablet Take 500 mg by mouth daily with breakfast.   NON FORMULARY Diet:Regular   omeprazole (PRILOSEC) 40 MG capsule Take 40 mg by mouth daily.    polyethylene glycol (MIRALAX / GLYCOLAX) 17 g packet Take 17 g by mouth daily. 9 pm   QUEtiapine (SEROQUEL) 100 MG tablet Take 100 mg by mouth 2 (two) times daily.   Semaglutide (RYBELSUS) 7 MG TABS Take 7 mg by mouth daily at 2 PM.   No facility-administered encounter medications on file as of 09/30/2022.     SIGNIFICANT DIAGNOSTIC EXAMS  PREVIOUS   06-10-21: thyroid biopsy   NO NEW EXAMS.   LABS REVIEWED PREVIOUS:     10-21-21: glucose 133; bun 9; creat 0.67; k+ 4.0; na++ 139; ca 8.7; protein 6.1; albumin 2.6; hgb a1c 5.7; chol 99; ldl 49; trig 104; hdl 29 01-06-22: PSA 2.14 01-19-22: wbc 10.8; hgb 11.3; hct 38.7; mcv 75.6 plt 214; glucose 251; bun 11; creat 0.69; k+ 3.6; na++ 135; ca 8.6; gfr >60; protein 6.8; albumin 2.9 keppra 13.2 lactic acid 0.9 tsh 1.318 02-10-22: vitamin D 43.68  03-14-22; hgb a1c 5.7 03-28-22: glucose 336; bun 7; creat 0.61; k+ 3.6; na++ 137; ca 8.9; gfr>60 06-24-22: wbc 9.2; hgb 10.0; hct 34.9; mcv 87.7 plt 241; glucose 145; bun 8; creat 0.67; k+ 3.6; na++ 140; ca 8.5; gfr >60; protein 6.0; albumin 2.5;  vitamin D 43.99; chol 116; ldl 68; trig 78; hdl 32; tsh 1.238 free t4: 0.79 hgb A1c 7.5 07-30-22: tsh 1.240 free t4: 0.88 t3: 118  NO NEW LABS.    Review of Systems  Constitutional:  Negative for malaise/fatigue.  Respiratory:  Negative for cough and shortness of breath.   Cardiovascular:  Negative for chest pain, palpitations and leg swelling.  Gastrointestinal:  Negative for abdominal pain, constipation and heartburn.  Musculoskeletal:  Negative for back pain, joint pain and myalgias.  Skin: Negative.   Neurological:  Negative for dizziness.  Psychiatric/Behavioral:  The patient  is not nervous/anxious.     Physical Exam Constitutional:      General: He is not in acute distress.    Appearance: He is well-developed. He is not diaphoretic.  Neck:     Thyroid: Thyroid mass and thyromegaly present.  Cardiovascular:     Rate and Rhythm: Normal rate and regular rhythm.     Pulses: Normal pulses.     Heart sounds: Normal heart sounds.  Pulmonary:     Effort: Pulmonary effort is normal. No respiratory distress.     Breath sounds: Normal breath sounds.  Abdominal:     General: Bowel sounds are normal. There is no distension.     Palpations: Abdomen is soft.     Tenderness: There is no abdominal tenderness.  Musculoskeletal:     Cervical back: Neck supple.     Right lower leg: No edema.     Left lower leg: No edema.     Comments: Left hemiplegia   Lymphadenopathy:     Cervical: No cervical adenopathy.  Skin:    General: Skin is warm and dry.  Neurological:     Mental Status: He is alert. Mental status is at baseline.  Psychiatric:        Mood and Affect: Mood normal.      ASSESSMENT/ PLAN:  TODAY  Hypertension associated with type 2 diabetes mellitus Hemiparesis affecting left side as late effect of cerebrovascular accident Moderate vascular dementia with psychotic disturbance   Will stop ensure max  Will continue current plan of care Will continue to monitor his  status  Time spent with patient: 40 minutes: medications plan of care dietary.    Ok Edwards NP [facility]  call [phone]

## 2022-10-04 ENCOUNTER — Ambulatory Visit: Payer: Medicaid Other | Admitting: "Endocrinology

## 2022-10-13 ENCOUNTER — Encounter: Payer: Self-pay | Admitting: Internal Medicine

## 2022-10-13 ENCOUNTER — Non-Acute Institutional Stay (SKILLED_NURSING_FACILITY): Payer: Medicaid Other | Admitting: Internal Medicine

## 2022-10-13 DIAGNOSIS — E43 Unspecified severe protein-calorie malnutrition: Secondary | ICD-10-CM | POA: Diagnosis not present

## 2022-10-13 DIAGNOSIS — I69354 Hemiplegia and hemiparesis following cerebral infarction affecting left non-dominant side: Secondary | ICD-10-CM

## 2022-10-13 DIAGNOSIS — E042 Nontoxic multinodular goiter: Secondary | ICD-10-CM | POA: Diagnosis not present

## 2022-10-13 DIAGNOSIS — E1149 Type 2 diabetes mellitus with other diabetic neurological complication: Secondary | ICD-10-CM | POA: Diagnosis not present

## 2022-10-13 NOTE — Assessment & Plan Note (Addendum)
Clinically goiter is unchanged.  Thyroid function test are normal or therapeutic.  Continue [place] monitor as he is on Rybelsus ( Semaglutide) tablets as he refuses insulin.

## 2022-10-13 NOTE — Assessment & Plan Note (Signed)
Most recent albumin 2.5 and total protein 6 compatible with protein/caloric malnutrition.  Nutritionist continues to follow at the SNF.

## 2022-10-13 NOTE — Assessment & Plan Note (Signed)
Left hemiparesis is unchanged.  PT/OT continues to evaluate him.

## 2022-10-13 NOTE — Patient Instructions (Signed)
See assessment and plan under each diagnosis in the problem list and acutely for this visit 

## 2022-10-13 NOTE — Progress Notes (Signed)
NURSING HOME LOCATION:  [facility] ROOM NUMBER:  [id number] D  CODE STATUS:  DNR  PCP:  Ok Edwards NP  This is a nursing facility follow up visit of chronic medical diagnoses & to document compliance with Regulation 483.30 (c) in The [place] Manual Phase 2 which mandates caregiver visit ( visits can alternate among physician, PA or NP as per statutes) within 10 days of 30 days / 60 days/ 90 days post admission to SNF date    Interim medical record and care since last SNF visit was updated with review of diagnostic studies and change in clinical status since last visit were documented.  HPI: He is a permanent resident of the facility with medical diagnoses of diabetes with neurovascular complications, essential hypertension, history of seizures, dyslipidemia, multinodular goiter, history of stroke with hemiparesis, protein/caloric malnutrition, and pseudobulbar affect. Significant surgeries including craniotomy, PEG placement, and I&D of a perirectal abscess..  In reference to the multinodular goiter; most recent thyroid function test revealed a TSH of 2.255 and free T4 of 0.82.  The most recent A1c on record was 5.3% on 06/24/2022; A1c was 7.5% on 03/14/2022.  Also on 06/24/2022 creatinine was 0.67 with GFR greater than 60.  Albumin was 2.5 and total protein 6, compatible with protein/caloric malnutrition.  LDL was 68, at goal of less than 70.  He is wheelchair-bound and requires moderate-max assist with activities of daily living.  He is incontinent of urine and stool. I had questioned his Nurse as to his daily schedule as he was in his bed asleep when I came to see him @ approximately 2 pm.   She reported that he is up in his wheelchair interacting with other residents frequently.   Review of systems: He states his glucoses have ranged from a low of 108 up to a high of 148.  He describes occasional polyphagia.  When asked if food stuck when he ate he stated "only  on the left " , pointing to jaw area, not his throat.  He does have dyspepsia.  He also has constipation "a lot."  The remainder of the review of systems is negative.  Constitutional: No fever, significant weight change, fatigue  Eyes: No redness, discharge, pain, vision change ENT/mouth: No nasal congestion,  purulent discharge, earache, change in hearing, sore throat  Cardiovascular: No chest pain, palpitations, paroxysmal nocturnal dyspnea, edema  Respiratory: No cough, sputum production, hemoptysis, DOE, significant snoring, apnea   Gastrointestinal: No abdominal pain, nausea /vomiting, rectal bleeding, melena Genitourinary: No dysuria, hematuria, pyuria, incontinence, nocturia Musculoskeletal: No joint stiffness, joint swelling, weakness, pain Dermatologic: No rash, pruritus, change in appearance of skin Neurologic: No dizziness, headache, syncope, seizures, numbness, tingling Psychiatric: No significant anxiety, depression, insomnia, anorexia Endocrine: No change in hair/skin/nails, excessive thirst,excessive urination  Hematologic/lymphatic: No significant bruising, lymphadenopathy, abnormal bleeding Allergy/immunology: No itchy/watery eyes, significant sneezing, urticaria, angioedema  Physical exam:  Pertinent or positive findings: As noted it was approximately 2 PM and he was still in bed asleep.  He aroused easily.  He exhibited somewhat of a baby talk character to his speech pattern.  Pattern alopecia is present.  The left lacrimal gland is prominent.  Ptosis is present on the right; the left eye exhibits a wide-eyed stare.  The left nasolabial fold is slightly decreased.  A large irregular goiter is present in the right anterior neck.  Heart rate is irregular.  Abdomen is protuberant.  Dorsalis pedis pulses are stronger than posterior  tibial pulses.  There is trace edema at the sock line.  Left hemiparesis is present.  The left hand is clawed.  General appearance: Adequately  nourished; no acute distress, increased work of breathing is present.   Lymphatic: No lymphadenopathy about the head, neck, axilla. Eyes: No conjunctival inflammation or lid edema is present. There is no scleral icterus. Ears:  External ear exam shows no significant lesions or deformities.   Nose:  External nasal examination shows no deformity or inflammation. Nasal mucosa are pink and moist without lesions, exudates Oral exam:  Lips and gums are healthy appearing. There is no oropharyngeal erythema or exudate. Neck:  No tenderness noted.    Heart:  No gallop, murmur, click, rub .  Lungs: Chest clear to auscultation without wheezes, rhonchi, rales, rubs. Abdomen: Bowel sounds are normal. Abdomen is soft and nontender with no organomegaly, hernias, masses. GU: Deferred  Extremities:  No cyanosis, clubbing  Neurologic exam :Balance, Rhomberg, finger to nose testing could not be completed due to clinical state Skin: Warm & dry w/o tenting. No significant lesions or rash.  See summary under each active problem in the Problem List with associated updated therapeutic plan

## 2022-10-13 NOTE — Assessment & Plan Note (Addendum)
Most recent A1c was 5.3% on 06/24/2022; it was 7.5% on 03/14/2022.  He states that glucoses range from a low of 108 up to a high of 148.  He denies any diabetic symptomatology except for occasional polyphagia.  A1c update indicated.

## 2022-11-08 ENCOUNTER — Encounter: Payer: Self-pay | Admitting: Adult Health

## 2022-11-08 ENCOUNTER — Non-Acute Institutional Stay (SKILLED_NURSING_FACILITY): Payer: Medicaid Other | Admitting: Adult Health

## 2022-11-08 DIAGNOSIS — Z8673 Personal history of transient ischemic attack (TIA), and cerebral infarction without residual deficits: Secondary | ICD-10-CM | POA: Diagnosis not present

## 2022-11-08 DIAGNOSIS — R569 Unspecified convulsions: Secondary | ICD-10-CM

## 2022-11-08 DIAGNOSIS — E559 Vitamin D deficiency, unspecified: Secondary | ICD-10-CM

## 2022-11-08 DIAGNOSIS — K5909 Other constipation: Secondary | ICD-10-CM

## 2022-11-14 NOTE — Progress Notes (Signed)
Location:  [facility] Room Number: 113 D Place of Service:  SNF (31)   CODE STATUS: dnr   Allergies  Allergen Reactions   Cheese    Penicillins Hives    Has patient had a PCN reaction causing immediate rash, facial/tongue/throat swelling, SOB or lightheadedness with hypotension: Unknown Has patient had a PCN reaction causing severe rash involving mucus membranes or skin necrosis: Unknown Has patient had a PCN reaction that required hospitalization: No Has patient had a PCN reaction occurring within the last 10 years: No If all of the above answers are "NO", then may proceed with Cephalosporin use.     Chief Complaint  Patient presents with   Medical Management of Chronic Issues                 Seizure:  Chronic constipation: History of CVA:  Vitamin D deficiency    HPI:  He is a 62 year old long term resident of this facility being seen for the management of his chronic illnesses: Seizure:  Chronic constipation: History of CVA:  Vitamin D deficiency.  There are no reports of uncontrolled pain. He is getting out of bed most days. He is able to propel himself in wheelchair. There are few periods of verbal agitation.   Past Medical History:  Diagnosis Date   DM (diabetes mellitus)    History of seizures    Hypertension    Stroke     Past Surgical History:  Procedure Laterality Date   CRANIECTOMY Right 04/01/2017   Procedure: RIGHT DECOMPRESSIVE CRANIECTOMY;  Surgeon: Ditty, Loura Halt, MD;  Location: [facility] OR;  Service: Neurosurgery;  Laterality: Right;   ESOPHAGOGASTRODUODENOSCOPY N/A 04/13/2017   Procedure: ESOPHAGOGASTRODUODENOSCOPY (EGD);  Surgeon: Violeta Gelinas, MD;  Location: [facility] ENDOSCOPY;  Service: General;  Laterality: N/A;  bedside   INCISION AND DRAINAGE PERIRECTAL ABSCESS N/A 09/28/2017   Procedure: IRRIGATION AND DEBRIDEMENT PERIANAL ABSCESS;  Surgeon: Lucretia Roers, MD;  Location: [facility] ORS;  Service: General;  Laterality: N/A;   PEG  PLACEMENT N/A 04/13/2017   Procedure: PERCUTANEOUS ENDOSCOPIC GASTROSTOMY (PEG) PLACEMENT;  Surgeon: Violeta Gelinas, MD;  Location: [facility] ENDOSCOPY;  Service: General;  Laterality: N/A;    Social History   Socioeconomic History   Marital status: Unknown    Spouse name: Not on file   Number of children: Not on file   Years of education: Not on file   Highest education level: Not on file  Occupational History   Not on file  Tobacco Use   Smoking status: Former    Types: Cigarettes   Smokeless tobacco: Never   Tobacco comments:    UTA  Vaping Use   Vaping Use: Never used  Substance and Sexual Activity   Alcohol use: No    Comment: UTA   Drug use: No    Comment: UTA   Sexual activity: Not Currently    Birth control/protection: None  Other Topics Concern   Not on file  Social History Narrative   Not on file   Social Determinants of Health   Financial Resource Strain: Not on file  Food Insecurity: Not on file  Transportation Needs: Not on file  Physical Activity: Not on file  Stress: Not on file  Social Connections: Not on file  Intimate Partner Violence: Not on file   Family History  Problem Relation Age of Onset   Hypertension Father       VITAL SIGNS BP 112/74   Pulse 78  Ht 5\' 6"  (1.676 m)   Wt 180 lb (81.6 kg)   SpO2 98%   BMI 29.05 kg/m   Outpatient Encounter Medications as of 11/08/2022  Medication Sig   acetaminophen (TYLENOL) 325 MG tablet Take 650 mg by mouth as directed. Every 4-6 hours as needed, not to exceed 3 grams in 24 hours   apixaban (ELIQUIS) 5 MG TABS tablet Take 5 mg by mouth 2 (two) times daily.    atorvastatin (LIPITOR) 20 MG tablet Take 20 mg by mouth at bedtime.   Cholecalciferol 1.25 MG (50000 UT) capsule Take 50,000 Units by mouth once a week. For Low Vitamin D Level on Monday   Dextromethorphan-quiNIDine (NUEDEXTA) 20-10 MG capsule Take 1 capsule by mouth 2 (two) times daily.   docusate (COLACE) 50 MG/5ML liquid Take 100 mg by mouth  at bedtime.   Ensure Max Protein (ENSURE MAX PROTEIN) LIQD Take 11 oz by mouth daily. 9 am   gabapentin (NEURONTIN) 100 MG capsule Take 100 mg by mouth at bedtime.   levETIRAcetam (KEPPRA XR) 500 MG 24 hr tablet Take 500 mg by mouth daily. At bedtime 8 pm   Menthol, Topical Analgesic, (BIOFREEZE) 4 % GEL Apply 1 application  topically at bedtime as needed. Apply to left shoulder   metFORMIN (GLUCOPHAGE-XR) 500 MG 24 hr tablet Take 500 mg by mouth daily with breakfast.   NON FORMULARY Diet:Regular   omeprazole (PRILOSEC) 40 MG capsule Take 40 mg by mouth daily.    polyethylene glycol (MIRALAX / GLYCOLAX) 17 g packet Take 17 g by mouth daily. 9 pm   QUEtiapine (SEROQUEL) 100 MG tablet Take 100 mg by mouth 2 (two) times daily.   Semaglutide (RYBELSUS) 7 MG TABS Take 7 mg by mouth daily at 2 PM.   No facility-administered encounter medications on file as of 11/08/2022.     SIGNIFICANT DIAGNOSTIC EXAMS  PREVIOUS   06-10-21: thyroid biopsy   NO NEW EXAMS.   LABS REVIEWED PREVIOUS:     01-06-22: PSA 2.14 01-19-22: wbc 10.8; hgb 11.3; hct 38.7; mcv 75.6 plt 214; glucose 251; bun 11; creat 0.69; k+ 3.6; na++ 135; ca 8.6; gfr >60; protein 6.8; albumin 2.9 keppra 13.2 lactic acid 0.9 tsh 1.318 02-10-22: vitamin D 43.68  03-14-22; hgb a1c 5.7 03-28-22: glucose 336; bun 7; creat 0.61; k+ 3.6; na++ 137; ca 8.9; gfr>60 06-24-22: wbc 9.2; hgb 10.0; hct 34.9; mcv 87.7 plt 241; glucose 145; bun 8; creat 0.67; k+ 3.6; na++ 140; ca 8.5; gfr >60; protein 6.0; albumin 2.5; vitamin D 43.99; chol 116; ldl 68; trig 78; hdl 32; tsh 1.238 free t4: 0.79 hgb A1c 7.5 07-30-22: tsh 1.240 free t4: 0.88 t3: 118  TODAY  08-31-22: tsh 2.255 free t3: 2.8; free t4: 0.82 09-29-22: ACR: 15      Review of Systems  Constitutional:  Negative for malaise/fatigue.  Respiratory:  Negative for cough and shortness of breath.   Cardiovascular:  Negative for chest pain, palpitations and leg swelling.  Gastrointestinal:  Negative for  abdominal pain, constipation and heartburn.  Musculoskeletal:  Negative for back pain, joint pain and myalgias.  Skin: Negative.   Neurological:  Negative for dizziness.  Psychiatric/Behavioral:  The patient is not nervous/anxious.    Physical Exam Constitutional:      General: He is not in acute distress.    Appearance: He is well-developed. He is not diaphoretic.  Neck:     Thyroid: Thyroid mass and thyromegaly present.  Cardiovascular:     Rate  and Rhythm: Normal rate and regular rhythm.     Pulses: Normal pulses.     Heart sounds: Normal heart sounds.  Pulmonary:     Effort: Pulmonary effort is normal. No respiratory distress.     Breath sounds: Normal breath sounds.  Abdominal:     General: Bowel sounds are normal. There is no distension.     Palpations: Abdomen is soft.     Tenderness: There is no abdominal tenderness.  Musculoskeletal:     Cervical back: Neck supple.     Right lower leg: No edema.     Left lower leg: No edema.     Comments: Left hemiplegia   Lymphadenopathy:     Cervical: No cervical adenopathy.  Skin:    General: Skin is warm and dry.  Neurological:     Mental Status: He is alert. Mental status is at baseline.  Psychiatric:        Mood and Affect: Mood normal.     ASSESSMENT/ PLAN:  TODAY;   Seizure: no recent activity will continue keppra xl 500 mg daily   2. Chronic constipation: will continue colace twice daily   3. 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 Vitamin D deficiency: 43.99 will is off supplement   PREVIOUS   5. Hemiplegia affecting left side as late effect of cerebrovascular accident (CVA); is off baclofen at this time   6. GERD without esophagitis: will continue prilosec 40 mg daily   7. Multinodular goiter: is status post aspiration; awaiting endocrinology appointment    8. Type 2 diabetes mellitus with neurological complications: hgb A1c 7.5 will continue metformin xr 500 mg daily and rybelsis 7 mg daily   9. Hypertension associated with type 2 diabetes mellitus: b/p 112/74: is off medications  10. Delusions/paranoia: will continue seroquel 100 mg twice daily   11. PAF (paroxsymal atrial fibrillation) heart rate is stable will continue eliquis 5 mg twice daily   12. PBA has less verbal outbursts; will continue nuedexta twice daily   13. Hypochromic anemia: hgb 10.0; will monitor   14. Protein calorie malnutrition severe; albumin 2.5 has declined prostat  15. Dyslipidemia associated with type 2 diabetes mellitus: ldl 68; will continue lipitor 20 mg daily   16. Diabetic peripheral neuropathy: will continue gabapentin 100 mg nightly         Synthia Innocenteborah Inga Noller NP [facility]   call [phone]

## 2022-12-13 LAB — HM DIABETES FOOT EXAM: HM Diabetic Foot Exam: 360

## 2022-12-14 ENCOUNTER — Encounter: Payer: Self-pay | Admitting: Adult Health

## 2022-12-14 ENCOUNTER — Non-Acute Institutional Stay (SKILLED_NURSING_FACILITY): Payer: Medicaid Other | Admitting: Adult Health

## 2022-12-14 DIAGNOSIS — K219 Gastro-esophageal reflux disease without esophagitis: Secondary | ICD-10-CM

## 2022-12-14 DIAGNOSIS — E1149 Type 2 diabetes mellitus with other diabetic neurological complication: Secondary | ICD-10-CM

## 2022-12-14 DIAGNOSIS — E042 Nontoxic multinodular goiter: Secondary | ICD-10-CM | POA: Diagnosis not present

## 2022-12-14 DIAGNOSIS — I69354 Hemiplegia and hemiparesis following cerebral infarction affecting left non-dominant side: Secondary | ICD-10-CM

## 2022-12-14 NOTE — Progress Notes (Signed)
Location:  [facility] Room Number: NO/113/D Place of Service:  SNF (31) Shanitra Phillippi S.,NP  CODE STATUS: DNR  Allergies  Allergen Reactions   Cheese    Penicillins Hives    Has patient had a PCN reaction causing immediate rash, facial/tongue/throat swelling, SOB or lightheadedness with hypotension: Unknown Has patient had a PCN reaction causing severe rash involving mucus membranes or skin necrosis: Unknown Has patient had a PCN reaction that required hospitalization: No Has patient had a PCN reaction occurring within the last 10 years: No If all of the above answers are "NO", then may proceed with Cephalosporin use.     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 There are no reports of uncontrolled pain. Joe Wilkinson does get out of bed most days. His weight is stable.   Past Medical History:  Diagnosis Date   DM (diabetes mellitus) (HCC)    History of seizures    Hypertension    Stroke [facility])     Past Surgical History:  Procedure Laterality Date   CRANIECTOMY Right 04/01/2017   Procedure: RIGHT DECOMPRESSIVE CRANIECTOMY;  Surgeon: Ditty, Loura Halt, MD;  Location: [facility] OR;  Service: Neurosurgery;  Laterality: Right;   ESOPHAGOGASTRODUODENOSCOPY N/A 04/13/2017   Procedure: ESOPHAGOGASTRODUODENOSCOPY (EGD);  Surgeon: Violeta Gelinas, MD;  Location: [facility] ENDOSCOPY;  Service: General;  Laterality: N/A;  bedside   INCISION AND  DRAINAGE PERIRECTAL ABSCESS N/A 09/28/2017   Procedure: IRRIGATION AND DEBRIDEMENT PERIANAL ABSCESS;  Surgeon: Lucretia Roers, MD;  Location: [facility] ORS;  Service: General;  Laterality: N/A;   PEG PLACEMENT N/A 04/13/2017   Procedure: PERCUTANEOUS ENDOSCOPIC GASTROSTOMY (PEG) PLACEMENT;  Surgeon: Violeta Gelinas, MD;  Location: [facility] ENDOSCOPY;  Service: General;  Laterality: N/A;    Social History   Socioeconomic History   Marital status: Unknown    Spouse name: Not on file   Number of children: Not on file   Years of education: Not on file   Highest education level: Not on file  Occupational History   Not on file  Tobacco Use   Smoking status: Former    Types: Cigarettes   Smokeless tobacco: Never   Tobacco comments:    UTA  Vaping Use   Vaping Use: Never used  Substance and Sexual Activity   Alcohol use: No    Comment: UTA   Drug use: No    Comment: UTA   Sexual activity: Not Currently    Birth control/protection: None  Other Topics Concern   Not on file  Social History Narrative   Not on file   Social Determinants of Health   Financial Resource Strain: Not on file  Food Insecurity: Not on file  Transportation Needs: Not on file  Physical Activity: Not on file  Stress: Not on file  Social Connections: Not on file  Intimate Partner Violence: Not on file   Family History  Problem Relation Age of Onset   Hypertension Father  VITAL SIGNS BP (!) 112/48   Pulse (!) 118   Temp 98.9 F (37.2 C)   Resp 18   Ht 5\' 6"  (1.676 m)   Wt 176 lb 6.4 oz (80 kg)   BMI 28.47 kg/m   Outpatient Encounter Medications as of 12/14/2022  Medication Sig   acetaminophen (TYLENOL) 325 MG tablet Take 650 mg by mouth as directed. Every 4-6 hours as needed, not to exceed 3 grams in 24 hours   apixaban (ELIQUIS) 5 MG TABS tablet Take 5 mg by mouth 2 (two) times daily.    atorvastatin (LIPITOR) 20 MG tablet Take 20 mg by mouth at bedtime.   Cholecalciferol 1.25 MG (50000 UT)  capsule Take 50,000 Units by mouth once a week. For Low Vitamin D Level on Monday   Dextromethorphan-quiNIDine (NUEDEXTA) 20-10 MG capsule Take 1 capsule by mouth 2 (two) times daily.   docusate (COLACE) 50 MG/5ML liquid Take 100 mg by mouth at bedtime.   Ensure Max Protein (ENSURE MAX PROTEIN) LIQD Take 11 oz by mouth daily. 9 am   gabapentin (NEURONTIN) 100 MG capsule Take 100 mg by mouth at bedtime.   levETIRAcetam (KEPPRA XR) 500 MG 24 hr tablet Take 500 mg by mouth daily. At bedtime 8 pm   Menthol, Topical Analgesic, (BIOFREEZE) 4 % GEL Apply 1 application  topically at bedtime as needed. Apply to left shoulder   metFORMIN (GLUCOPHAGE-XR) 500 MG 24 hr tablet Take 500 mg by mouth daily with breakfast.   NON FORMULARY Diet:Regular   omeprazole (PRILOSEC) 40 MG capsule Take 40 mg by mouth daily.    polyethylene glycol (MIRALAX / GLYCOLAX) 17 g packet Take 17 g by mouth daily. 9 pm   QUEtiapine (SEROQUEL) 100 MG tablet Take 100 mg by mouth 2 (two) times daily.   Semaglutide (RYBELSUS) 7 MG TABS Take 7 mg by mouth daily at 2 PM.   No facility-administered encounter medications on file as of 12/14/2022.     SIGNIFICANT DIAGNOSTIC EXAMS  PREVIOUS   06-10-21: thyroid biopsy   NO NEW EXAMS.   LABS REVIEWED PREVIOUS:     01-06-22: PSA 2.14 01-19-22: wbc 10.8; hgb 11.3; hct 38.7; mcv 75.6 plt 214; glucose 251; bun 11; creat 0.69; k+ 3.6; na++ 135; ca 8.6; gfr >60; protein 6.8; albumin 2.9 keppra 13.2 lactic acid 0.9 tsh 1.318 02-10-22: vitamin D 43.68  03-14-22; hgb a1c 5.7 03-28-22: glucose 336; bun 7; creat 0.61; k+ 3.6; na++ 137; ca 8.9; gfr>60 06-24-22: wbc 9.2; hgb 10.0; hct 34.9; mcv 87.7 plt 241; glucose 145; bun 8; creat 0.67; k+ 3.6; na++ 140; ca 8.5; gfr >60; protein 6.0; albumin 2.5; vitamin D 43.99; chol 116; ldl 68; trig 78; hdl 32; tsh 1.238 free t4: 0.79 hgb A1c 7.5 07-30-22: tsh 1.240 free t4: 0.88 t3: 118  TODAY  08-31-22: tsh 2.255 free t3: 2.8; free t4: 0.82 09-29-22: ACR: 15       Review of Systems  Constitutional:  Negative for malaise/fatigue.  Respiratory:  Negative for cough and shortness of breath.   Cardiovascular:  Negative for chest pain, palpitations and leg swelling.  Gastrointestinal:  Negative for abdominal pain, constipation and heartburn.  Musculoskeletal:  Negative for back pain, joint pain and myalgias.  Skin: Negative.   Neurological:  Negative for dizziness.  Psychiatric/Behavioral:  The patient is not nervous/anxious.    Physical Exam Constitutional:      General: Joe Wilkinson is not in acute distress.    Appearance: Joe Wilkinson is well-developed. Joe Wilkinson is  not diaphoretic.  Neck:     Thyroid: Thyroid mass and thyromegaly present.  Cardiovascular:     Rate and Rhythm: Normal rate and regular rhythm.     Pulses: Normal pulses.     Heart sounds: Normal heart sounds.  Pulmonary:     Effort: Pulmonary effort is normal. No respiratory distress.     Breath sounds: Normal breath sounds.  Abdominal:     General: Bowel sounds are normal. There is no distension.     Palpations: Abdomen is soft.     Tenderness: There is no abdominal tenderness.  Musculoskeletal:     Cervical back: Neck supple.     Right lower leg: No edema.     Left lower leg: No edema.     Comments: Left hemiplegia   Lymphadenopathy:     Cervical: No cervical adenopathy.  Skin:    General: Skin is warm and dry.  Neurological:     Mental Status: Joe Wilkinson is alert. Mental status is at baseline.     Comments: 01-07-22: SLUMS: 13/30  Psychiatric:        Mood and Affect: Mood normal.     ASSESSMENT/ PLAN:  TODAY;   Hemiplegia affecting left side as late effect of cerebrovascular accident (CVA) is off baclofen  2. GERD without esophagitis: will continue prilosec 40 mg daily   3. Multinodular goiter: is status post aspiration; is due for endocrinology appointment  4. Type 2 diabetes mellitus with neurological complications: hgb A1c 7.5 will continue metformin xr 500 mg daily and rybelsis 7 mg  daily    PREVIOUS   5. Hypertension associated with type 2 diabetes mellitus: b/p 112/48: is off medications  6. Delusions/paranoia: will continue seroquel 100 mg twice daily   7. PAF (paroxsymal atrial fibrillation) heart rate is stable will continue eliquis 5 mg twice daily   8. PBA has less verbal outbursts; will continue nuedexta twice daily   9. Hypochromic anemia: hgb 10.0; will monitor   10. Protein calorie malnutrition severe; albumin 2.5 has declined prostat  11. Dyslipidemia associated with type 2 diabetes mellitus: ldl 68; will continue lipitor 20 mg daily   12. Diabetic peripheral neuropathy: will continue gabapentin 100 mg nightly   13. Seizure: no recent activity will continue keppra xl 500 mg daily   14. Chronic constipation: will continue colace twice daily   15. 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 Vitamin D deficiency: 43.99 will is off supplement      Synthia Innocent NP [facility]  call [phone]

## 2022-12-19 ENCOUNTER — Other Ambulatory Visit: Payer: Self-pay

## 2022-12-19 DIAGNOSIS — E042 Nontoxic multinodular goiter: Secondary | ICD-10-CM

## 2022-12-20 ENCOUNTER — Ambulatory Visit: Payer: Medicaid Other | Admitting: "Endocrinology

## 2022-12-30 ENCOUNTER — Encounter: Payer: Self-pay | Admitting: Adult Health

## 2022-12-30 ENCOUNTER — Non-Acute Institutional Stay (SKILLED_NURSING_FACILITY): Payer: Medicaid Other | Admitting: Adult Health

## 2022-12-30 DIAGNOSIS — I152 Hypertension secondary to endocrine disorders: Secondary | ICD-10-CM

## 2022-12-30 DIAGNOSIS — I69354 Hemiplegia and hemiparesis following cerebral infarction affecting left non-dominant side: Secondary | ICD-10-CM | POA: Diagnosis not present

## 2022-12-30 DIAGNOSIS — I480 Paroxysmal atrial fibrillation: Secondary | ICD-10-CM

## 2022-12-30 DIAGNOSIS — E1159 Type 2 diabetes mellitus with other circulatory complications: Secondary | ICD-10-CM | POA: Diagnosis not present

## 2022-12-30 NOTE — Progress Notes (Unsigned)
Location:  [facility] Room Number: 11D Place of Service:  SNF (31)   CODE STATUS: DNR  Allergies  Allergen Reactions   Cheese    Penicillins Hives    Has patient had a PCN reaction causing immediate rash, facial/tongue/throat swelling, SOB or lightheadedness with hypotension: Unknown Has patient had a PCN reaction causing severe rash involving mucus membranes or skin necrosis: Unknown Has patient had a PCN reaction that required hospitalization: No Has patient had a PCN reaction occurring within the last 10 years: No If all of the above answers are "NO", then may proceed with Cephalosporin use.     Chief Complaint  Patient presents with   Acute Visit    Patient is being seen for Acute care plan meeting   Quality Metric Gaps    Patient is due for a hep c screening and A1c Screening     HPI:    Past Medical History:  Diagnosis Date   DM (diabetes mellitus) (HCC)    History of seizures    Hypertension    Stroke [facility])     Past Surgical History:  Procedure Laterality Date   CRANIECTOMY Right 04/01/2017   Procedure: RIGHT DECOMPRESSIVE CRANIECTOMY;  Surgeon: Ditty, Loura Halt, MD;  Location: [facility] OR;  Service: Neurosurgery;  Laterality: Right;   ESOPHAGOGASTRODUODENOSCOPY N/A 04/13/2017   Procedure: ESOPHAGOGASTRODUODENOSCOPY (EGD);  Surgeon: Violeta Gelinas, MD;  Location: [facility] ENDOSCOPY;  Service: General;  Laterality: N/A;  bedside   INCISION AND DRAINAGE PERIRECTAL ABSCESS N/A 09/28/2017   Procedure: IRRIGATION AND DEBRIDEMENT PERIANAL ABSCESS;  Surgeon: Lucretia Roers, MD;  Location: [facility] ORS;  Service: General;  Laterality: N/A;   PEG PLACEMENT N/A 04/13/2017   Procedure: PERCUTANEOUS ENDOSCOPIC GASTROSTOMY (PEG) PLACEMENT;  Surgeon: Violeta Gelinas, MD;  Location: [facility] ENDOSCOPY;  Service: General;  Laterality: N/A;    Social History   Socioeconomic History   Marital status: Unknown    Spouse name: Not on file   Number of children: Not on  file   Years of education: Not on file   Highest education level: Not on file  Occupational History   Not on file  Tobacco Use   Smoking status: Former    Types: Cigarettes   Smokeless tobacco: Never   Tobacco comments:    UTA  Vaping Use   Vaping Use: Never used  Substance and Sexual Activity   Alcohol use: No    Comment: UTA   Drug use: No    Comment: UTA   Sexual activity: Not Currently    Birth control/protection: None  Other Topics Concern   Not on file  Social History Narrative   Not on file   Social Determinants of Health   Financial Resource Strain: Not on file  Food Insecurity: Not on file  Transportation Needs: Not on file  Physical Activity: Not on file  Stress: Not on file  Social Connections: Not on file  Intimate Partner Violence: Not on file   Family History  Problem Relation Age of Onset   Hypertension Father       VITAL SIGNS BP 98/63   Pulse 63   Temp 98.4 F (36.9 C) (Temporal)   Resp 18   Ht 5\' 6"  (1.676 m)   Wt 176 lb 6.4 oz (80 kg)   SpO2 97%   BMI 28.47 kg/m   Outpatient Encounter Medications as of 12/30/2022  Medication Sig   acetaminophen (TYLENOL) 325 MG tablet Take 650 mg by mouth as directed. Every  4-6 hours as needed, not to exceed 3 grams in 24 hours   apixaban (ELIQUIS) 5 MG TABS tablet Take 5 mg by mouth 2 (two) times daily.    atorvastatin (LIPITOR) 20 MG tablet Take 20 mg by mouth at bedtime.   Cholecalciferol 1.25 MG (50000 UT) capsule Take 50,000 Units by mouth once a week. For Low Vitamin D Level on Monday   Dextromethorphan-quiNIDine (NUEDEXTA) 20-10 MG capsule Take 1 capsule by mouth 2 (two) times daily.   docusate (COLACE) 50 MG/5ML liquid Take 100 mg by mouth at bedtime.   Ensure Max Protein (ENSURE MAX PROTEIN) LIQD Take 11 oz by mouth daily. 9 am   gabapentin (NEURONTIN) 100 MG capsule Take 100 mg by mouth at bedtime.   levETIRAcetam (KEPPRA XR) 500 MG 24 hr tablet Take 500 mg by mouth daily. At bedtime 8 pm    Menthol, Topical Analgesic, (BIOFREEZE) 4 % GEL Apply 1 application  topically at bedtime as needed. Apply to left shoulder   metFORMIN (GLUCOPHAGE-XR) 500 MG 24 hr tablet Take 500 mg by mouth daily with breakfast.   NON FORMULARY Diet:Regular   omeprazole (PRILOSEC) 40 MG capsule Take 40 mg by mouth daily.    polyethylene glycol (MIRALAX / GLYCOLAX) 17 g packet Take 17 g by mouth daily. 9 pm   QUEtiapine (SEROQUEL) 100 MG tablet Take 100 mg by mouth 2 (two) times daily.   Semaglutide (RYBELSUS) 7 MG TABS Take 7 mg by mouth daily at 2 PM.   No facility-administered encounter medications on file as of 12/30/2022.     SIGNIFICANT DIAGNOSTIC EXAMS       ASSESSMENT/ PLAN:     Synthia Innocent NP [facility]  Contact [phone] Monday through Friday 8am- 5pm  After hours call [phone]

## 2023-01-05 ENCOUNTER — Non-Acute Institutional Stay (SKILLED_NURSING_FACILITY): Payer: Medicaid Other | Admitting: Internal Medicine

## 2023-01-05 ENCOUNTER — Encounter: Payer: Self-pay | Admitting: Internal Medicine

## 2023-01-05 DIAGNOSIS — I480 Paroxysmal atrial fibrillation: Secondary | ICD-10-CM | POA: Diagnosis not present

## 2023-01-05 DIAGNOSIS — E1149 Type 2 diabetes mellitus with other diabetic neurological complication: Secondary | ICD-10-CM

## 2023-01-05 DIAGNOSIS — I69354 Hemiplegia and hemiparesis following cerebral infarction affecting left non-dominant side: Secondary | ICD-10-CM

## 2023-01-05 DIAGNOSIS — E042 Nontoxic multinodular goiter: Secondary | ICD-10-CM

## 2023-01-05 NOTE — Assessment & Plan Note (Signed)
TFTs have been therapeutic.  Endocrinology follow-up next month.

## 2023-01-05 NOTE — Assessment & Plan Note (Addendum)
PT/OT report that he is dependent for assistance with ADLs.  He is nonambulatory, mobilizing in a wheelchair by propelling backwards with his right foot.

## 2023-01-05 NOTE — Progress Notes (Signed)
NURSING HOME LOCATION:  [facility] ROOM NUMBER:  113 D  CODE STATUS:  DNR  PCP:  Synthia Innocent NP  This is a nursing facility follow up visit of chronic medical diagnoses & to document compliance with Regulation 483.30 (c) in The [employer] Term [employer] Survey Manual Phase 2 which mandates caregiver visit ( visits can alternate among physician, PA or NP as per statutes) within 10 days of 30 days / 60 days/ 90 days post admission to SNF date    Interim medical record and care since last SNF visit was updated with review of diagnostic studies and change in clinical status since last visit were documented.  HPI: He is a permanent resident of this facility with medical diagnoses of history of seizures, essential hypertension,PAF, history of stroke with left hemiparesis, history of multinodular goiter, history of protein/caloric malnutrition, and diabetes with neurovascular complications. Comprehensive labs were last performed in November 2023.  He did have protein/caloric malnutrition with an albumin of 2.5 and total protein was 6.  A1c was 5.3%; he is on Rybelsus and metformin.  LDL was at goal with a value of 68.  He did have some slight progression of his hypochromic anemia with H/H of 10.0/34.9.  Labs are to be repeated this month.  Review of systems: PT/OT report that he is dependent for assistance for ADLs.  He is nonambulatory and mobilizes in wheelchair by pushing with his right foot.  He is incontinent of urine and stool.  Speech Therapy states he has moderate impairments. He describes recent right-sided headaches which occur every other day and are relieved by Tylenol.  He also has constipation.  He has intermittent numbness in his R hand. He is cognizant that he has an appointment with the Endocrinologist to monitor his goiter in approximately a month.  Constitutional: No fever, significant weight change, fatigue  Eyes: No redness, discharge, pain, vision change ENT/mouth: No  nasal congestion,  purulent discharge, earache, change in hearing, sore throat  Cardiovascular: No chest pain, palpitations, paroxysmal nocturnal dyspnea, edema  Respiratory: No cough, sputum production, hemoptysis, DOE, significant snoring, apnea   Gastrointestinal: No heartburn, dysphagia, abdominal pain, nausea /vomiting, rectal bleeding, melena Genitourinary: No dysuria, hematuria, pyuria, incontinence, nocturia Musculoskeletal: No joint stiffness, joint swelling, pain Dermatologic: No rash, pruritus, change in appearance of skin Neurologic: No dizziness, syncope, seizures Psychiatric: No significant anxiety, depression, insomnia, anorexia Endocrine: No change in hair/skin/nails, excessive thirst, excessive hunger, excessive urination  Hematologic/lymphatic: No significant bruising, lymphadenopathy, abnormal bleeding Allergy/immunology: No itchy/watery eyes, significant sneezing, urticaria, angioedema  Physical exam:  Pertinent or positive findings: I found him performing laps around the facility in his wheelchair propelling backwards with his right foot.  Facies are somewhat blank.  The left eye is deviated somewhat superiorly.  Extraocular motion is intact.  Pattern alopecia is present.  He has an operative scar over the crown ,right of the midline.  He exhibits a baby talk type speech cadence and character.  Left nasolabial fold is decreased.  He is completely edentulous.  There is a large asymmetric goiter.  Rhythm is slightly irregular.  Pedal pulses are decreased.  He has trace edema at the left sock line.  Slight clubbing of the nailbeds is suggested.  He has left hemiparesis.  General appearance: Adequately nourished; no acute distress, increased work of breathing is present.   Lymphatic: No lymphadenopathy about the head, neck, axilla. Eyes: No conjunctival inflammation or lid edema is present. There is no scleral icterus.  Ears:  External ear exam shows no significant lesions or  deformities.   Nose:  External nasal examination shows no deformity or inflammation. Nasal mucosa are pink and moist without lesions, exudates Neck:  No thyromegaly, masses, tenderness noted.    Heart:  No gallop, murmur, click, rub .  Lungs: Chest clear to auscultation without wheezes, rhonchi, rales, rubs. Abdomen: Bowel sounds are normal. Abdomen is soft and nontender with no organomegaly, hernias, masses. GU: Deferred  Extremities:  No cyanosis  Neurologic exam :Balance, Rhomberg, finger to nose testing could not be completed due to clinical state Skin: Warm & dry w/o tenting. No significant lesions or rash.  See summary under each active problem in the Problem List with associated updated therapeutic plan

## 2023-01-05 NOTE — Assessment & Plan Note (Signed)
On Rybelsus and metformin current A1c is prediabetic at 5.3%. To date he refuses insulin.  A1c will be updated.

## 2023-01-05 NOTE — Assessment & Plan Note (Signed)
Clinically he is in atrial fibrillation with a well-controlled rate.  No change indicated.  Continue Eliquis prophylaxis.

## 2023-01-05 NOTE — Patient Instructions (Signed)
See assessment and plan under each diagnosis in the problem list and acutely for this visit 

## 2023-01-09 ENCOUNTER — Non-Acute Institutional Stay (SKILLED_NURSING_FACILITY): Payer: Medicaid Other | Admitting: Adult Health

## 2023-01-09 ENCOUNTER — Inpatient Hospital Stay (HOSPITAL_COMMUNITY)
Admission: EM | Admit: 2023-01-09 | Discharge: 2023-01-12 | DRG: 378 | Disposition: A | Discharge status: Skilled Nursing Facility | Payer: Medicaid Other | Source: Skilled Nursing Facility | Attending: Family Medicine | Admitting: Family Medicine

## 2023-01-09 ENCOUNTER — Encounter (HOSPITAL_COMMUNITY): Payer: Self-pay

## 2023-01-09 ENCOUNTER — Encounter: Payer: Self-pay | Admitting: Adult Health

## 2023-01-09 ENCOUNTER — Other Ambulatory Visit: Payer: Self-pay

## 2023-01-09 ENCOUNTER — Other Ambulatory Visit (HOSPITAL_COMMUNITY)
Admission: RE | Admit: 2023-01-09 | Discharge: 2023-01-09 | Disposition: A | Discharge status: Home / Self Care | Payer: Medicaid Other | Source: Skilled Nursing Facility | Attending: Adult Health | Admitting: Adult Health

## 2023-01-09 DIAGNOSIS — D649 Anemia, unspecified: Secondary | ICD-10-CM | POA: Diagnosis not present

## 2023-01-09 DIAGNOSIS — Z91011 Allergy to milk products: Secondary | ICD-10-CM

## 2023-01-09 DIAGNOSIS — D120 Benign neoplasm of cecum: Secondary | ICD-10-CM | POA: Diagnosis present

## 2023-01-09 DIAGNOSIS — D62 Acute posthemorrhagic anemia: Secondary | ICD-10-CM | POA: Diagnosis present

## 2023-01-09 DIAGNOSIS — Z8249 Family history of ischemic heart disease and other diseases of the circulatory system: Secondary | ICD-10-CM

## 2023-01-09 DIAGNOSIS — E1149 Type 2 diabetes mellitus with other diabetic neurological complication: Secondary | ICD-10-CM

## 2023-01-09 DIAGNOSIS — Z7901 Long term (current) use of anticoagulants: Secondary | ICD-10-CM | POA: Diagnosis not present

## 2023-01-09 DIAGNOSIS — I69354 Hemiplegia and hemiparesis following cerebral infarction affecting left non-dominant side: Secondary | ICD-10-CM

## 2023-01-09 DIAGNOSIS — D509 Iron deficiency anemia, unspecified: Secondary | ICD-10-CM

## 2023-01-09 DIAGNOSIS — I69318 Other symptoms and signs involving cognitive functions following cerebral infarction: Secondary | ICD-10-CM

## 2023-01-09 DIAGNOSIS — R195 Other fecal abnormalities: Secondary | ICD-10-CM

## 2023-01-09 DIAGNOSIS — K5909 Other constipation: Secondary | ICD-10-CM | POA: Diagnosis present

## 2023-01-09 DIAGNOSIS — E785 Hyperlipidemia, unspecified: Secondary | ICD-10-CM | POA: Diagnosis present

## 2023-01-09 DIAGNOSIS — I480 Paroxysmal atrial fibrillation: Secondary | ICD-10-CM | POA: Diagnosis not present

## 2023-01-09 DIAGNOSIS — K5730 Diverticulosis of large intestine without perforation or abscess without bleeding: Secondary | ICD-10-CM | POA: Diagnosis present

## 2023-01-09 DIAGNOSIS — G40909 Epilepsy, unspecified, not intractable, without status epilepticus: Secondary | ICD-10-CM | POA: Diagnosis present

## 2023-01-09 DIAGNOSIS — Z79899 Other long term (current) drug therapy: Secondary | ICD-10-CM

## 2023-01-09 DIAGNOSIS — Z993 Dependence on wheelchair: Secondary | ICD-10-CM

## 2023-01-09 DIAGNOSIS — E1169 Type 2 diabetes mellitus with other specified complication: Secondary | ICD-10-CM | POA: Diagnosis present

## 2023-01-09 DIAGNOSIS — Z87891 Personal history of nicotine dependence: Secondary | ICD-10-CM

## 2023-01-09 DIAGNOSIS — T45525A Adverse effect of antithrombotic drugs, initial encounter: Secondary | ICD-10-CM | POA: Diagnosis present

## 2023-01-09 DIAGNOSIS — Z7984 Long term (current) use of oral hypoglycemic drugs: Secondary | ICD-10-CM

## 2023-01-09 DIAGNOSIS — D6832 Hemorrhagic disorder due to extrinsic circulating anticoagulants: Secondary | ICD-10-CM | POA: Diagnosis present

## 2023-01-09 DIAGNOSIS — R569 Unspecified convulsions: Secondary | ICD-10-CM

## 2023-01-09 DIAGNOSIS — Z880 Allergy status to penicillin: Secondary | ICD-10-CM

## 2023-01-09 DIAGNOSIS — Z1152 Encounter for screening for COVID-19: Secondary | ICD-10-CM

## 2023-01-09 DIAGNOSIS — I10 Essential (primary) hypertension: Secondary | ICD-10-CM | POA: Diagnosis present

## 2023-01-09 DIAGNOSIS — Z8719 Personal history of other diseases of the digestive system: Secondary | ICD-10-CM | POA: Diagnosis present

## 2023-01-09 DIAGNOSIS — I152 Hypertension secondary to endocrine disorders: Secondary | ICD-10-CM | POA: Diagnosis present

## 2023-01-09 DIAGNOSIS — F0150 Vascular dementia without behavioral disturbance: Secondary | ICD-10-CM | POA: Diagnosis present

## 2023-01-09 DIAGNOSIS — K922 Gastrointestinal hemorrhage, unspecified: Principal | ICD-10-CM | POA: Diagnosis present

## 2023-01-09 DIAGNOSIS — E1159 Type 2 diabetes mellitus with other circulatory complications: Secondary | ICD-10-CM | POA: Diagnosis present

## 2023-01-09 DIAGNOSIS — I69398 Other sequelae of cerebral infarction: Secondary | ICD-10-CM

## 2023-01-09 DIAGNOSIS — E559 Vitamin D deficiency, unspecified: Secondary | ICD-10-CM | POA: Diagnosis present

## 2023-01-09 DIAGNOSIS — D123 Benign neoplasm of transverse colon: Secondary | ICD-10-CM | POA: Diagnosis present

## 2023-01-09 HISTORY — DX: Unspecified atrial fibrillation: I48.91

## 2023-01-09 LAB — CBC
HCT: 28.5 % — ABNORMAL LOW (ref 39.0–52.0)
Hemoglobin: 7.6 g/dL — ABNORMAL LOW (ref 13.0–17.0)
MCH: 20.1 pg — ABNORMAL LOW (ref 26.0–34.0)
MCHC: 26.7 g/dL — ABNORMAL LOW (ref 30.0–36.0)
MCV: 75.4 fL — ABNORMAL LOW (ref 80.0–100.0)
Platelets: 263 10*3/uL (ref 150–400)
RBC: 3.78 MIL/uL — ABNORMAL LOW (ref 4.22–5.81)
RDW: 21 % — ABNORMAL HIGH (ref 11.5–15.5)
WBC: 12.7 10*3/uL — ABNORMAL HIGH (ref 4.0–10.5)
nRBC: 0.4 % — ABNORMAL HIGH (ref 0.0–0.2)

## 2023-01-09 LAB — CBC WITH DIFFERENTIAL/PLATELET
Abs Immature Granulocytes: 0.05 10*3/uL (ref 0.00–0.07)
Basophils Absolute: 0.2 10*3/uL — ABNORMAL HIGH (ref 0.0–0.1)
Basophils Relative: 2 %
Eosinophils Absolute: 0.5 10*3/uL (ref 0.0–0.5)
Eosinophils Relative: 4 %
HCT: 31.5 % — ABNORMAL LOW (ref 39.0–52.0)
Hemoglobin: 8.3 g/dL — ABNORMAL LOW (ref 13.0–17.0)
Immature Granulocytes: 0 %
Lymphocytes Relative: 28 %
Lymphs Abs: 3.6 10*3/uL (ref 0.7–4.0)
MCH: 19.8 pg — ABNORMAL LOW (ref 26.0–34.0)
MCHC: 26.3 g/dL — ABNORMAL LOW (ref 30.0–36.0)
MCV: 75 fL — ABNORMAL LOW (ref 80.0–100.0)
Monocytes Absolute: 1.2 10*3/uL — ABNORMAL HIGH (ref 0.1–1.0)
Monocytes Relative: 9 %
Neutro Abs: 7.3 10*3/uL (ref 1.7–7.7)
Neutrophils Relative %: 57 %
Platelets: 264 10*3/uL (ref 150–400)
RBC: 4.2 MIL/uL — ABNORMAL LOW (ref 4.22–5.81)
RDW: 20.8 % — ABNORMAL HIGH (ref 11.5–15.5)
WBC: 12.9 10*3/uL — ABNORMAL HIGH (ref 4.0–10.5)
nRBC: 0.2 % (ref 0.0–0.2)

## 2023-01-09 LAB — HEMOGLOBIN AND HEMATOCRIT, BLOOD
HCT: 28.9 % — ABNORMAL LOW (ref 39.0–52.0)
Hemoglobin: 7.7 g/dL — ABNORMAL LOW (ref 13.0–17.0)

## 2023-01-09 LAB — POC OCCULT BLOOD, ED: Fecal Occult Bld: POSITIVE — AB

## 2023-01-09 LAB — COMPREHENSIVE METABOLIC PANEL
ALT: 11 U/L (ref 0–44)
AST: 8 U/L — ABNORMAL LOW (ref 15–41)
Albumin: 2.7 g/dL — ABNORMAL LOW (ref 3.5–5.0)
Alkaline Phosphatase: 46 U/L (ref 38–126)
Anion gap: 7 (ref 5–15)
BUN: 13 mg/dL (ref 8–23)
CO2: 27 mmol/L (ref 22–32)
Calcium: 9 mg/dL (ref 8.9–10.3)
Chloride: 107 mmol/L (ref 98–111)
Creatinine, Ser: 0.69 mg/dL (ref 0.61–1.24)
GFR, Estimated: >60 mL/min (ref >60)
Glucose, Bld: 126 mg/dL — ABNORMAL HIGH (ref 70–99)
Potassium: 3.5 mmol/L (ref 3.5–5.1)
Sodium: 141 mmol/L (ref 135–145)
Total Bilirubin: 0.3 mg/dL (ref 0.3–1.2)
Total Protein: 6.3 g/dL — ABNORMAL LOW (ref 6.5–8.1)

## 2023-01-09 LAB — BASIC METABOLIC PANEL
Anion gap: 7 (ref 5–15)
BUN: 12 mg/dL (ref 8–23)
CO2: 26 mmol/L (ref 22–32)
Calcium: 9.1 mg/dL (ref 8.9–10.3)
Chloride: 105 mmol/L (ref 98–111)
Creatinine, Ser: 0.77 mg/dL (ref 0.61–1.24)
GFR, Estimated: >60 mL/min (ref >60)
Glucose, Bld: 112 mg/dL — ABNORMAL HIGH (ref 70–99)
Potassium: 3.9 mmol/L (ref 3.5–5.1)
Sodium: 138 mmol/L (ref 135–145)

## 2023-01-09 LAB — TSH: TSH: 1.501 u[IU]/mL (ref 0.350–4.500)

## 2023-01-09 LAB — TYPE AND SCREEN
ABO/RH(D): O NEG
Antibody Screen: NEGATIVE

## 2023-01-09 LAB — LIPID PANEL
Cholesterol: 99 mg/dL (ref 0–200)
HDL: 29 mg/dL — ABNORMAL LOW (ref >40)
LDL Cholesterol: 46 mg/dL (ref 0–99)
Total CHOL/HDL Ratio: 3.4 RATIO
Triglycerides: 118 mg/dL (ref <150)
VLDL: 24 mg/dL (ref 0–40)

## 2023-01-09 LAB — GLUCOSE, CAPILLARY: Glucose-Capillary: 101 mg/dL — ABNORMAL HIGH (ref 70–99)

## 2023-01-09 MED ORDER — ACETAMINOPHEN 650 MG RE SUPP: 650.0000 mg | Freq: Four times a day (QID) | RECTAL | Status: DC | PRN

## 2023-01-09 MED ORDER — GABAPENTIN 100 MG PO CAPS
100.0000 mg | ORAL_CAPSULE | Freq: Every day | ORAL | Status: DC
  Administered 2023-01-09 – 2023-01-11 (×3): 100 mg via ORAL
  Filled 2023-01-09 (×3): qty 1

## 2023-01-09 MED ORDER — INSULIN ASPART 100 UNIT/ML IJ SOLN: 0.0000 [IU] | Freq: Every day | INTRAMUSCULAR | Status: DC

## 2023-01-09 MED ORDER — ONDANSETRON HCL 4 MG/2ML IJ SOLN: 4.0000 mg | Freq: Four times a day (QID) | INTRAMUSCULAR | Status: DC | PRN

## 2023-01-09 MED ORDER — ACETAMINOPHEN 325 MG PO TABS: 650.0000 mg | ORAL_TABLET | Freq: Four times a day (QID) | ORAL | Status: DC | PRN

## 2023-01-09 MED ORDER — PANTOPRAZOLE SODIUM 40 MG IV SOLR
40.0000 mg | INTRAVENOUS | Status: DC
  Administered 2023-01-10: 40 mg via INTRAVENOUS
  Filled 2023-01-09 (×2): qty 10

## 2023-01-09 MED ORDER — PANTOPRAZOLE SODIUM 40 MG IV SOLR: 40.0000 mg | INTRAVENOUS | Status: DC

## 2023-01-09 MED ORDER — POLYETHYLENE GLYCOL 3350 17 G PO PACK: 17.0000 g | PACK | Freq: Every day | ORAL | Status: DC | PRN

## 2023-01-09 MED ORDER — QUETIAPINE FUMARATE 25 MG PO TABS
100.0000 mg | ORAL_TABLET | Freq: Every day | ORAL | Status: DC
  Administered 2023-01-09 – 2023-01-11 (×3): 100 mg via ORAL
  Filled 2023-01-09 (×3): qty 4

## 2023-01-09 MED ORDER — PANTOPRAZOLE SODIUM 40 MG IV SOLR
40.0000 mg | Freq: Once | INTRAVENOUS | Status: AC
  Administered 2023-01-09: 40 mg via INTRAVENOUS
  Filled 2023-01-09: qty 10

## 2023-01-09 MED ORDER — INSULIN ASPART 100 UNIT/ML IJ SOLN
0.0000 [IU] | Freq: Three times a day (TID) | INTRAMUSCULAR | Status: DC
  Administered 2023-01-10 – 2023-01-11 (×2): 1 [IU] via SUBCUTANEOUS

## 2023-01-09 MED ORDER — LEVETIRACETAM ER 500 MG PO TB24
500.0000 mg | ORAL_TABLET | ORAL | Status: DC
  Administered 2023-01-09 – 2023-01-11 (×3): 500 mg via ORAL
  Filled 2023-01-09 (×4): qty 1

## 2023-01-09 MED ORDER — ONDANSETRON HCL 4 MG PO TABS: 4.0000 mg | ORAL_TABLET | Freq: Four times a day (QID) | ORAL | Status: DC | PRN

## 2023-01-09 NOTE — ED Triage Notes (Signed)
Facility reports pt with hgb 7.7, pt reports he feels great and has no problems to reports.

## 2023-01-09 NOTE — Assessment & Plan Note (Signed)
-   HgbA1c - SSi- S -Hold metformin, semaglutide

## 2023-01-09 NOTE — Progress Notes (Signed)
Location:  [facility] Room Number: 113 Place of Service:  SNF (31)   CODE STATUS: dnr   Allergies  Allergen Reactions   Cheese    Penicillins Hives    Has patient had a PCN reaction causing immediate rash, facial/tongue/throat swelling, SOB or lightheadedness with hypotension: Unknown Has patient had a PCN reaction causing severe rash involving mucus membranes or skin necrosis: Unknown Has patient had a PCN reaction that required hospitalization: No Has patient had a PCN reaction occurring within the last 10 years: No If all of the above answers are "NO", then may proceed with Cephalosporin use.     Chief Complaint  Patient presents with   Acute Visit    Follow up labs     HPI:  He had routine labs drawn; was found to have a hgb of 7.6. his previous 10.0 in November. There are no reports of blood in stool; no reports of diarrhea, nausea of vomiting present.   Past Medical History:  Diagnosis Date   DM (diabetes mellitus) (HCC)    History of seizures    Hypertension    Stroke [facility])     Past Surgical History:  Procedure Laterality Date   CRANIECTOMY Right 04/01/2017   Procedure: RIGHT DECOMPRESSIVE CRANIECTOMY;  Surgeon: Ditty, Loura Halt, MD;  Location: [facility] OR;  Service: Neurosurgery;  Laterality: Right;   ESOPHAGOGASTRODUODENOSCOPY N/A 04/13/2017   Procedure: ESOPHAGOGASTRODUODENOSCOPY (EGD);  Surgeon: Violeta Gelinas, MD;  Location[facility] ENDOSCOPY;  Service: General;  Laterality: N/A;  bedside   INCISION AND DRAINAGE PERIRECTAL ABSCESS N/A 09/28/2017   Procedure: IRRIGATION AND DEBRIDEMENT PERIANAL ABSCESS;  Surgeon: Lucretia Roers, MD;  Location: [facility] ORS;  Service: General;  Laterality: N/A;   PEG PLACEMENT N/A 04/13/2017   Procedure: PERCUTANEOUS ENDOSCOPIC GASTROSTOMY (PEG) PLACEMENT;  Surgeon: Violeta Gelinas, MD;  Location: [facility] ENDOSCOPY;  Service: General;  Laterality: N/A;    Social History   Socioeconomic History   Marital status:  Unknown    Spouse name: Not on file   Number of children: Not on file   Years of education: Not on file   Highest education level: Not on file  Occupational History   Not on file  Tobacco Use   Smoking status: Former    Types: Cigarettes   Smokeless tobacco: Never   Tobacco comments:    UTA  Vaping Use   Vaping Use: Never used  Substance and Sexual Activity   Alcohol use: No    Comment: UTA   Drug use: No    Comment: UTA   Sexual activity: Not Currently    Birth control/protection: None  Other Topics Concern   Not on file  Social History Narrative   Not on file   Social Determinants of Health   Financial Resource Strain: Not on file  Food Insecurity: Not on file  Transportation Needs: Not on file  Physical Activity: Not on file  Stress: Not on file  Social Connections: Not on file  Intimate Partner Violence: Not on file   Family History  Problem Relation Age of Onset   Hypertension Father       VITAL SIGNS BP 130/78   Pulse (!) 103   Temp 97.9 F (36.6 C)   Resp 16   Ht 5\' 6"  (1.676 m)   Wt 176 lb 6.4 oz (80 kg)   SpO2 97%   BMI 28.47 kg/m   Outpatient Encounter Medications as of 01/09/2023  Medication Sig   acetaminophen (TYLENOL)  325 MG tablet Take 650 mg by mouth as directed. Every 4-6 hours as needed, not to exceed 3 grams in 24 hours   apixaban (ELIQUIS) 5 MG TABS tablet Take 5 mg by mouth 2 (two) times daily.    atorvastatin (LIPITOR) 20 MG tablet Take 20 mg by mouth at bedtime.   Cholecalciferol 1.25 MG (50000 UT) capsule Take 50,000 Units by mouth once a week. For Low Vitamin D Level on Monday   Dextromethorphan-quiNIDine (NUEDEXTA) 20-10 MG capsule Take 1 capsule by mouth 2 (two) times daily.   docusate (COLACE) 50 MG/5ML liquid Take 100 mg by mouth at bedtime.   Ensure Max Protein (ENSURE MAX PROTEIN) LIQD Take 11 oz by mouth daily. 9 am   gabapentin (NEURONTIN) 100 MG capsule Take 100 mg by mouth at bedtime.   levETIRAcetam (KEPPRA XR) 500 MG  24 hr tablet Take 500 mg by mouth daily. At bedtime 8 pm   Menthol, Topical Analgesic, (BIOFREEZE) 4 % GEL Apply 1 application  topically at bedtime as needed. Apply to left shoulder   metFORMIN (GLUCOPHAGE-XR) 500 MG 24 hr tablet Take 500 mg by mouth daily with breakfast.   NON FORMULARY Diet:Regular   omeprazole (PRILOSEC) 40 MG capsule Take 40 mg by mouth daily.    polyethylene glycol (MIRALAX / GLYCOLAX) 17 g packet Take 17 g by mouth daily. 9 pm   QUEtiapine (SEROQUEL) 100 MG tablet Take 100 mg by mouth 2 (two) times daily.   Semaglutide (RYBELSUS) 7 MG TABS Take 7 mg by mouth daily at 2 PM.   No facility-administered encounter medications on file as of 01/09/2023.     SIGNIFICANT DIAGNOSTIC EXAMS  PREVIOUS   06-10-21: thyroid biopsy   NO NEW EXAMS.   LABS REVIEWED PREVIOUS:     01-06-22: PSA 2.14 01-19-22: wbc 10.8; hgb 11.3; hct 38.7; mcv 75.6 plt 214; glucose 251; bun 11; creat 0.69; k+ 3.6; na++ 135; ca 8.6; gfr >60; protein 6.8; albumin 2.9 keppra 13.2 lactic acid 0.9 tsh 1.318 02-10-22: vitamin D 43.68  03-14-22; hgb a1c 5.7 03-28-22: glucose 336; bun 7; creat 0.61; k+ 3.6; na++ 137; ca 8.9; gfr>60 06-24-22: wbc 9.2; hgb 10.0; hct 34.9; mcv 87.7 plt 241; glucose 145; bun 8; creat 0.67; k+ 3.6; na++ 140; ca 8.5; gfr >60; protein 6.0; albumin 2.5; vitamin D 43.99; chol 116; ldl 68; trig 78; hdl 32; tsh 1.238 free t4: 0.79 hgb A1c 7.5 07-30-22: tsh 1.240 free t4: 0.88 t3: 118 08-31-22: tsh 2.255 free t3: 2.8; free t4: 0.82 09-29-22: ACR: 15  TODAY  01-09-23: wbc 12.7; hgb 7.6; hct 28.5; mcv 75.4 plt 263; glucose 126; bun 13; creat 0.69; k+ 3.5; na++ 141; ca 9.0; gfr>60; protein 6.3 albumin 2.7 chol 99; ldl 46; trig 118; hdl 29; tsh 1.501 01-08-23 (#2); hgb 7.7; hct 28.9        Review of Systems  Constitutional:  Negative for malaise/fatigue.  Respiratory:  Negative for cough and shortness of breath.   Cardiovascular:  Negative for chest pain, palpitations and leg swelling.   Gastrointestinal:  Negative for abdominal pain, constipation and heartburn.  Musculoskeletal:  Negative for back pain, joint pain and myalgias.  Skin: Negative.   Neurological:  Negative for dizziness.  Psychiatric/Behavioral:  The patient is not nervous/anxious.    Physical Exam Constitutional:      General: He is not in acute distress.    Appearance: He is well-developed. He is not diaphoretic.  Neck:     Thyroid: Thyroid mass  and thyromegaly present.  Cardiovascular:     Rate and Rhythm: Normal rate and regular rhythm.     Pulses: Normal pulses.     Heart sounds: Normal heart sounds.  Pulmonary:     Effort: Pulmonary effort is normal. No respiratory distress.     Breath sounds: Normal breath sounds.  Abdominal:     General: Bowel sounds are normal. There is no distension.     Palpations: Abdomen is soft.     Tenderness: There is no abdominal tenderness.  Musculoskeletal:     Cervical back: Neck supple.     Right lower leg: No edema.     Left lower leg: No edema.     Comments: Left hemiplegia   Lymphadenopathy:     Cervical: No cervical adenopathy.  Skin:    General: Skin is warm and dry.  Neurological:     Mental Status: He is alert. Mental status is at baseline.  Psychiatric:        Mood and Affect: Mood normal.       ASSESSMENT/ PLAN:  TODAY  Hypochromic anemia: is worse; due to significant drop in hgb which was confirmed by a repeat blood draw; will send to the ED for further evaluation.    Synthia Innocent NP [facility]   call [phone]

## 2023-01-09 NOTE — Consult Note (Signed)
Gastroenterology Consult   Referring Provider: [facility] Primary Care Physician:  Sharee Holster, NP Primary Gastroenterologist:  Dr. Marletta Lor, previously unassigned  Patient ID: Joe Wilkinson; [id number]; 1960/12/24   Admit date: 01/09/2023  LOS: 0 days   Date of Consultation: 01/09/2023  Reason for Consultation:  Acute blood loss anemia  History of Present Illness   Joe Wilkinson is a 62 y.o. year old male with history of DM, seizures, HTN, CVA with left hemiparesis in 2018, multinodular goiter, afib on Eliquis,  constipation, incontinence of bowel and bladder, resident of the [facility] who presented to [facility] after routine labs revealed Hgb 7.6, down from 10 in Nov 2023. He was sent to [facility] for evaluation.  In the ED, Hgb 7.7, and on recheck this afternoon was 8.3. Heme positive.   He is wheelchair bound. Left-sided paralysis. He believes he had a colonoscopy and EGD years ago in [place], [place], when he resided there with his wife. Appears EGD for PEG placement done in 2018 at [facility] without obvious abnormalities. Unable to find records in Care Everywhere or Epic.   He denies any overt GI bleeding, nausea, vomiting, abdominal pain, distension, dysphagia. He has a good appetite. On stool softener and Miralax for chronic constipation. No new changes in bowel habits. Denies SOB or fatigue.   No FH colon cancer or polyps.     EGD 2018 by Dr. Janee Morn at [facility] for PEG placement. No obvious abnormalities on EGD   Past Medical History:  Diagnosis Date   A-fib (HCC)    DM (diabetes mellitus) (HCC)    History of seizures    Hypertension    Stroke [facility])     Past Surgical History:  Procedure Laterality Date   CHOLECYSTECTOMY     CRANIECTOMY Right 04/01/2017   Procedure: RIGHT DECOMPRESSIVE CRANIECTOMY;  Surgeon: Ditty, Loura Halt, MD;  Location: [facility] OR;  Service: Neurosurgery;  Laterality: Right;   ESOPHAGOGASTRODUODENOSCOPY N/A 04/13/2017   Procedure:  ESOPHAGOGASTRODUODENOSCOPY (EGD);  Surgeon: Violeta Gelinas, MD;  Location: [facility] ENDOSCOPY;  Service: General;  Laterality: N/A;  bedside   INCISION AND DRAINAGE PERIRECTAL ABSCESS N/A 09/28/2017   Procedure: IRRIGATION AND DEBRIDEMENT PERIANAL ABSCESS;  Surgeon: Lucretia Roers, MD;  Location: [facility] ORS;  Service: General;  Laterality: N/A;   PEG PLACEMENT N/A 04/13/2017   Procedure: PERCUTANEOUS ENDOSCOPIC GASTROSTOMY (PEG) PLACEMENT;  Surgeon: Violeta Gelinas, MD;  Location: [facility] ENDOSCOPY;  Service: General;  Laterality: N/A;    Prior to Admission medications   Medication Sig Start Date End Date Taking? Authorizing Provider  acetaminophen (TYLENOL) 325 MG tablet Take 650 mg by mouth as directed. Every 4-6 hours as needed, not to exceed 3 grams in 24 hours   Yes [provider]  apixaban (ELIQUIS) 5 MG TABS tablet Take 5 mg by mouth 2 (two) times daily.  06/18/18  Yes [provider]  atorvastatin (LIPITOR) 20 MG tablet Take 20 mg by mouth at bedtime.   Yes [provider]  Cholecalciferol 1.25 MG (50000 UT) capsule Take 50,000 Units by mouth once a week. For Low Vitamin D Level on Monday 01/07/20  Yes [provider]  Dextromethorphan-quiNIDine (NUEDEXTA) 20-10 MG capsule Take 1 capsule by mouth 2 (two) times daily. 03/31/22 08/08/48 Yes [provider]  docusate (COLACE) 50 MG/5ML liquid Take 100 mg by mouth at bedtime.   Yes [provider]  gabapentin (NEURONTIN) 100 MG capsule Take 100 mg by mouth at bedtime.   Yes [provider]  levETIRAcetam (KEPPRA XR) 500 MG 24 hr tablet Take 500 mg by mouth daily. At bedtime 8 pm   Yes [provider]  loratadine (CLARITIN) 10 MG tablet Take 10 mg by mouth daily.   Yes [provider]  LORazepam (ATIVAN) 2 MG/ML concentrated solution Take 0.5 mg by mouth as needed for seizure (injection). As needed for seizures lasting longer than 2 minutes. May give up to 3 doses- one dose every  15 minutes   Yes [provider]  Menthol, Topical Analgesic, (BIOFREEZE) 4 % GEL Apply 1 application  topically at bedtime as needed. Apply to left shoulder   Yes [provider]  metFORMIN (GLUCOPHAGE-XR) 500 MG 24 hr tablet Take 500 mg by mouth daily with breakfast.   Yes [provider]  omeprazole (PRILOSEC) 40 MG capsule Take 40 mg by mouth daily.    Yes [provider]  ondansetron (ZOFRAN-ODT) 8 MG disintegrating tablet Take 8 mg by mouth every 6 (six) hours as needed for nausea or vomiting.   Yes [provider]  polyethylene glycol (MIRALAX / GLYCOLAX) 17 g packet Take 17 g by mouth daily. 9 pm   Yes [provider]  QUEtiapine (SEROQUEL) 100 MG tablet Take 100 mg by mouth at bedtime.   Yes [provider]  Semaglutide (RYBELSUS) 7 MG TABS Take 7 mg by mouth daily at 2 PM.   Yes [provider]  Ensure Max Protein (ENSURE MAX PROTEIN) LIQD Take 11 oz by mouth daily. 9 am    [provider]  NON FORMULARY Diet:Regular 04/29/19   [provider]    No current facility-administered medications for this encounter.   Current Outpatient Medications  Medication Sig Dispense Refill   acetaminophen (TYLENOL) 325 MG tablet Take 650 mg by mouth as directed. Every 4-6 hours as needed, not to exceed 3 grams in 24 hours     apixaban (ELIQUIS) 5 MG TABS tablet Take 5 mg by mouth 2 (two) times daily.      atorvastatin (LIPITOR) 20 MG tablet Take 20 mg by mouth at bedtime.     Cholecalciferol 1.25 MG (50000 UT) capsule Take 50,000 Units by mouth once a week. For Low Vitamin D Level on Monday     Dextromethorphan-quiNIDine (NUEDEXTA) 20-10 MG capsule Take 1 capsule by mouth 2 (two) times daily.     docusate (COLACE) 50 MG/5ML liquid Take 100 mg by mouth at bedtime.     gabapentin (NEURONTIN) 100 MG capsule Take 100 mg by mouth at bedtime.     levETIRAcetam (KEPPRA XR) 500 MG 24 hr tablet Take 500 mg by mouth  daily. At bedtime 8 pm     loratadine (CLARITIN) 10 MG tablet Take 10 mg by mouth daily.     LORazepam (ATIVAN) 2 MG/ML concentrated solution Take 0.5 mg by mouth as needed for seizure (injection). As needed for seizures lasting longer than 2 minutes. May give up to 3 doses- one dose every 15 minutes     Menthol, Topical Analgesic, (BIOFREEZE) 4 % GEL Apply 1 application  topically at bedtime as needed. Apply to left shoulder     metFORMIN (GLUCOPHAGE-XR) 500 MG 24 hr tablet Take 500 mg by mouth daily with breakfast.     omeprazole (PRILOSEC) 40 MG capsule Take 40 mg by mouth daily.      ondansetron (ZOFRAN-ODT) 8 MG disintegrating tablet Take 8 mg by mouth every 6 (six) hours as needed for nausea or vomiting.  polyethylene glycol (MIRALAX / GLYCOLAX) 17 g packet Take 17 g by mouth daily. 9 pm     QUEtiapine (SEROQUEL) 100 MG tablet Take 100 mg by mouth at bedtime.     Semaglutide (RYBELSUS) 7 MG TABS Take 7 mg by mouth daily at 2 PM.     Ensure Max Protein (ENSURE MAX PROTEIN) LIQD Take 11 oz by mouth daily. 9 am     NON FORMULARY Diet:Regular      Allergies as of 01/09/2023 - Review Complete 01/09/2023  Allergen Reaction Noted   Cheese  01/19/2018   Penicillins Hives 09/24/2017    Family History  Problem Relation Age of Onset   Hypertension Father    Colon cancer Neg Hx    Colon polyps Neg Hx     Social History   Socioeconomic History   Marital status: Unknown    Spouse name: Not on file   Number of children: Not on file   Years of education: Not on file   Highest education level: Not on file  Occupational History   Not on file  Tobacco Use   Smoking status: Former    Types: Cigarettes   Smokeless tobacco: Never   Tobacco comments:    UTA  Vaping Use   Vaping Use: Never used  Substance and Sexual Activity   Alcohol use: No    Comment: UTA   Drug use: No    Comment: UTA   Sexual activity: Not Currently    Birth control/protection: None  Other Topics Concern    Not on file  Social History Narrative   Not on file   Social Determinants of Health   Financial Resource Strain: Not on file  Food Insecurity: Not on file  Transportation Needs: Not on file  Physical Activity: Not on file  Stress: Not on file  Social Connections: Not on file  Intimate Partner Violence: Not on file     Review of Systems   Gen: Denies any fever, chills, loss of appetite, change in weight or weight loss CV: Denies chest pain, heart palpitations, syncope, edema  Resp: Denies shortness of breath with rest, cough, wheezing, coughing up blood, and pleurisy. GI: see HPI GU : Denies urinary burning, blood in urine, urinary frequency, and urinary incontinence. MS: see HPI Derm: Denies rash, itching, dry skin, hives. Psych: Denies depression, anxiety, memory loss, hallucinations, and confusion. Heme: Denies bruising or bleeding Neuro:  Denies any headaches, dizziness, paresthesias, shaking  Physical Exam   Vital Signs in last 24 hours: Temp:  [97.9 F (36.6 C)-98 F (36.7 C)] 98 F (36.7 C) (06/03 1609) Pulse Rate:  [61-103] 100 (06/03 1300) Resp:  [16-18] 17 (06/03 1300) BP: (101-130)/(64-87) 101/87 (06/03 1300) SpO2:  [93 %-97 %] 93 % (06/03 1300) Weight:  [80 kg] 80 kg (06/03 1201)    General:   Alert,  Well-developed, well-nourished, pleasant and cooperative in NAD Head:  Normocephalic and atraumatic. Eyes:  Sclera clear, no icterus.   Conjunctiva pink. Ears:  Normal auditory acuity. Lungs:  Clear throughout to auscultation.    Heart:  S1 S2 present, no obvious murmur. PVCs on telemetry Abdomen:  Soft, nontender and nondistended. No masses, hepatosplenomegaly or hernias noted. Normal bowel sounds, without guarding, and without rebound.   Rectal: deferred   Msk:  left-sided hemiparesis Extremities:  Without edema. Neurologic:  Alert and  oriented x4. Psych:  Alert and cooperative. Normal mood and affect.  Intake/Output from previous day: No  intake/output data recorded. Intake/Output this  shift: No intake/output data recorded.   Labs/Studies   Recent Labs Recent Labs    01/09/23 0800 01/09/23 1046 01/09/23 1508  WBC 12.7*  --  12.9*  HGB 7.6* 7.7* 8.3*  HCT 28.5* 28.9* 31.5*  PLT 263  --  264   BMET Recent Labs    01/09/23 0800 01/09/23 1508  NA 141 138  K 3.5 3.9  CL 107 105  CO2 27 26  GLUCOSE 126* 112*  BUN 13 12  CREATININE 0.69 0.77  CALCIUM 9.0 9.1   LFT Recent Labs    01/09/23 0800  PROT 6.3*  ALBUMIN 2.7*  AST 8*  ALT 11  ALKPHOS 46  BILITOT 0.3     Radiology/Studies No results found.   Assessment   Joe Wilkinson is a 62 y.o. year old male with history of DM, seizures, HTN, CVA with left hemiparesis in 2018, multinodular goiter, afib on Eliquis,  constipation, incontinence of bowel and bladder, resident of the [facility] who presented to [facility] after routine labs revealed Hgb 7.6, down from 10 in Nov 2023. He was sent to [facility] for evaluation.  He is heme positive but without any overt GI bleeding. Recheck H/H this afternoon with Hgb 8.3. Unclear when last complete colonoscopy was performed, as patient states was in [place], [place]. I am unable to see records. I do see where he had an EGD for PEG placement in 2018 at [facility] without obvious abnormalities. No FH colon cancer or polyps.   In setting of Eliquis, could have occult GI bleeding anywhere in GI tract. Recommend colonoscopy/EGD on Wednesday, allowing washout period of Eliquis.   Would also recommend holding oral semaglutide while inpatient in preparation for anesthesia.     Plan / Recommendations    Clear liquids today and tomorrow Anticipate colonoscopy/EGD on Wednesday Hold Eliquis PPI daily Follow H/H Hold Rybelsus while inpatient     01/09/2023, 4:44 PM  Gelene Mink, PhD, [facility] Gastroenterology

## 2023-01-09 NOTE — Assessment & Plan Note (Signed)
Resume Keppra, gabapentin

## 2023-01-09 NOTE — H&P (View-Only) (Signed)
 Gastroenterology Consult   Referring Provider: [facility] Primary Care Physician:  Green, Deborah S, NP Primary Gastroenterologist:  Dr. Carver, previously unassigned  Patient ID: Joe Wilkinson; [id number]; 02/18/1961   Admit date: 01/09/2023  LOS: 0 days   Date of Consultation: 01/09/2023  Reason for Consultation:  Acute blood loss anemia  History of Present Illness   Joe Wilkinson is a 61 y.o. year old male with history of DM, seizures, HTN, CVA with left hemiparesis in 2018, multinodular goiter, afib on Eliquis,  constipation, incontinence of bowel and bladder, resident of the [facility] who presented to [facility] after routine labs revealed Hgb 7.6, down from 10 in Nov 2023. He was sent to [place] for evaluation.  In the ED, Hgb 7.7, and on recheck this afternoon was 8.3. Heme positive.   He is wheelchair bound. Left-sided paralysis. He believes he had a colonoscopy and EGD years ago in [place], [place], when he resided there with his wife. Appears EGD for PEG placement done in 2018 at [facility] without obvious abnormalities. Unable to find records in Care Everywhere or Epic.   He denies any overt GI bleeding, nausea, vomiting, abdominal pain, distension, dysphagia. He has a good appetite. On stool softener and Miralax for chronic constipation. No new changes in bowel habits. Denies SOB or fatigue.   No FH colon cancer or polyps.     EGD 2018 by Dr. Thompson at [facility] for PEG placement. No obvious abnormalities on EGD   Past Medical History:  Diagnosis Date   A-fib (HCC)    DM (diabetes mellitus) (HCC)    History of seizures    Hypertension    Stroke (HCC)     Past Surgical History:  Procedure Laterality Date   CHOLECYSTECTOMY     CRANIECTOMY Right 04/01/2017   Procedure: RIGHT DECOMPRESSIVE CRANIECTOMY;  Surgeon: Ditty, Benjamin Jared, MD;  Location: [facility] OR;  Service: Neurosurgery;  Laterality: Right;   ESOPHAGOGASTRODUODENOSCOPY N/A 04/13/2017   Procedure:  ESOPHAGOGASTRODUODENOSCOPY (EGD);  Surgeon: Thompson, Burke, MD;  Location: [facility] ENDOSCOPY;  Service: General;  Laterality: N/A;  bedside   INCISION AND DRAINAGE PERIRECTAL ABSCESS N/A 09/28/2017   Procedure: IRRIGATION AND DEBRIDEMENT PERIANAL ABSCESS;  Surgeon: Bridges, Lindsay C, MD;  Location: [facility] ORS;  Service: General;  Laterality: N/A;   PEG PLACEMENT N/A 04/13/2017   Procedure: PERCUTANEOUS ENDOSCOPIC GASTROSTOMY (PEG) PLACEMENT;  Surgeon: Thompson, Burke, MD;  Location: [facility] ENDOSCOPY;  Service: General;  Laterality: N/A;    Prior to Admission medications   Medication Sig Start Date End Date Taking? Authorizing Provider  acetaminophen (TYLENOL) 325 MG tablet Take 650 mg by mouth as directed. Every 4-6 hours as needed, not to exceed 3 grams in 24 hours   Yes [provider]  apixaban (ELIQUIS) 5 MG TABS tablet Take 5 mg by mouth 2 (two) times daily.  06/18/18  Yes [provider]  atorvastatin (LIPITOR) 20 MG tablet Take 20 mg by mouth at bedtime.   Yes [provider]  Cholecalciferol 1.25 MG (50000 UT) capsule Take 50,000 Units by mouth once a week. For Low Vitamin D Level on Monday 01/07/20  Yes [provider]  Dextromethorphan-quiNIDine (NUEDEXTA) 20-10 MG capsule Take 1 capsule by mouth 2 (two) times daily. 03/31/22 08/08/48 Yes [provider]  docusate (COLACE) 50 MG/5ML liquid Take 100 mg by mouth at bedtime.   Yes [provider]  gabapentin (NEURONTIN) 100 MG capsule Take 100 mg by mouth at bedtime.   Yes [provider]  levETIRAcetam (KEPPRA XR) 500 MG 24 hr tablet Take 500 mg by mouth daily. At bedtime 8 pm   Yes [provider]  loratadine (CLARITIN) 10 MG tablet Take 10 mg by mouth daily.   Yes [provider]  LORazepam (ATIVAN) 2 MG/ML concentrated solution Take 0.5 mg by mouth as needed for seizure (injection). As needed for seizures lasting longer than 2 minutes. May give up to 3 doses- one dose every  15 minutes   Yes [provider]  Menthol, Topical Analgesic, (BIOFREEZE) 4 % GEL Apply 1 application  topically at bedtime as needed. Apply to left shoulder   Yes [provider]  metFORMIN (GLUCOPHAGE-XR) 500 MG 24 hr tablet Take 500 mg by mouth daily with breakfast.   Yes [provider]  omeprazole (PRILOSEC) 40 MG capsule Take 40 mg by mouth daily.    Yes [provider]  ondansetron (ZOFRAN-ODT) 8 MG disintegrating tablet Take 8 mg by mouth every 6 (six) hours as needed for nausea or vomiting.   Yes [provider]  polyethylene glycol (MIRALAX / GLYCOLAX) 17 g packet Take 17 g by mouth daily. 9 pm   Yes [provider]  QUEtiapine (SEROQUEL) 100 MG tablet Take 100 mg by mouth at bedtime.   Yes [provider]  Semaglutide (RYBELSUS) 7 MG TABS Take 7 mg by mouth daily at 2 PM.   Yes [provider]  Ensure Max Protein (ENSURE MAX PROTEIN) LIQD Take 11 oz by mouth daily. 9 am    [provider]  NON FORMULARY Diet:Regular 04/29/19   [provider]    No current facility-administered medications for this encounter.   Current Outpatient Medications  Medication Sig Dispense Refill   acetaminophen (TYLENOL) 325 MG tablet Take 650 mg by mouth as directed. Every 4-6 hours as needed, not to exceed 3 grams in 24 hours     apixaban (ELIQUIS) 5 MG TABS tablet Take 5 mg by mouth 2 (two) times daily.      atorvastatin (LIPITOR) 20 MG tablet Take 20 mg by mouth at bedtime.     Cholecalciferol 1.25 MG (50000 UT) capsule Take 50,000 Units by mouth once a week. For Low Vitamin D Level on Monday     Dextromethorphan-quiNIDine (NUEDEXTA) 20-10 MG capsule Take 1 capsule by mouth 2 (two) times daily.     docusate (COLACE) 50 MG/5ML liquid Take 100 mg by mouth at bedtime.     gabapentin (NEURONTIN) 100 MG capsule Take 100 mg by mouth at bedtime.     levETIRAcetam (KEPPRA XR) 500 MG 24 hr tablet Take 500 mg by mouth  daily. At bedtime 8 pm     loratadine (CLARITIN) 10 MG tablet Take 10 mg by mouth daily.     LORazepam (ATIVAN) 2 MG/ML concentrated solution Take 0.5 mg by mouth as needed for seizure (injection). As needed for seizures lasting longer than 2 minutes. May give up to 3 doses- one dose every 15 minutes     Menthol, Topical Analgesic, (BIOFREEZE) 4 % GEL Apply 1 application  topically at bedtime as needed. Apply to left shoulder     metFORMIN (GLUCOPHAGE-XR) 500 MG 24 hr tablet Take 500 mg by mouth daily with breakfast.     omeprazole (PRILOSEC) 40 MG capsule Take 40 mg by mouth daily.      ondansetron (ZOFRAN-ODT) 8 MG disintegrating tablet Take 8 mg by mouth every 6 (six) hours as needed for nausea or vomiting.       polyethylene glycol (MIRALAX / GLYCOLAX) 17 g packet Take 17 g by mouth daily. 9 pm     QUEtiapine (SEROQUEL) 100 MG tablet Take 100 mg by mouth at bedtime.     Semaglutide (RYBELSUS) 7 MG TABS Take 7 mg by mouth daily at 2 PM.     Ensure Max Protein (ENSURE MAX PROTEIN) LIQD Take 11 oz by mouth daily. 9 am     NON FORMULARY Diet:Regular      Allergies as of 01/09/2023 - Review Complete 01/09/2023  Allergen Reaction Noted   Cheese  01/19/2018   Penicillins Hives 09/24/2017    Family History  Problem Relation Age of Onset   Hypertension Father    Colon cancer Neg Hx    Colon polyps Neg Hx     Social History   Socioeconomic History   Marital status: Unknown    Spouse name: Not on file   Number of children: Not on file   Years of education: Not on file   Highest education level: Not on file  Occupational History   Not on file  Tobacco Use   Smoking status: Former    Types: Cigarettes   Smokeless tobacco: Never   Tobacco comments:    UTA  Vaping Use   Vaping Use: Never used  Substance and Sexual Activity   Alcohol use: No    Comment: UTA   Drug use: No    Comment: UTA   Sexual activity: Not Currently    Birth control/protection: None  Other Topics Concern    Not on file  Social History Narrative   Not on file   Social Determinants of Health   Financial Resource Strain: Not on file  Food Insecurity: Not on file  Transportation Needs: Not on file  Physical Activity: Not on file  Stress: Not on file  Social Connections: Not on file  Intimate Partner Violence: Not on file     Review of Systems   Gen: Denies any fever, chills, loss of appetite, change in weight or weight loss CV: Denies chest pain, heart palpitations, syncope, edema  Resp: Denies shortness of breath with rest, cough, wheezing, coughing up blood, and pleurisy. GI: see HPI GU : Denies urinary burning, blood in urine, urinary frequency, and urinary incontinence. MS: see HPI Derm: Denies rash, itching, dry skin, hives. Psych: Denies depression, anxiety, memory loss, hallucinations, and confusion. Heme: Denies bruising or bleeding Neuro:  Denies any headaches, dizziness, paresthesias, shaking  Physical Exam   Vital Signs in last 24 hours: Temp:  [97.9 F (36.6 C)-98 F (36.7 C)] 98 F (36.7 C) (06/03 1609) Pulse Rate:  [61-103] 100 (06/03 1300) Resp:  [16-18] 17 (06/03 1300) BP: (101-130)/(64-87) 101/87 (06/03 1300) SpO2:  [93 %-97 %] 93 % (06/03 1300) Weight:  [80 kg] 80 kg (06/03 1201)    General:   Alert,  Well-developed, well-nourished, pleasant and cooperative in NAD Head:  Normocephalic and atraumatic. Eyes:  Sclera clear, no icterus.   Conjunctiva pink. Ears:  Normal auditory acuity. Lungs:  Clear throughout to auscultation.    Heart:  S1 S2 present, no obvious murmur. PVCs on telemetry Abdomen:  Soft, nontender and nondistended. No masses, hepatosplenomegaly or hernias noted. Normal bowel sounds, without guarding, and without rebound.   Rectal: deferred   Msk:  left-sided hemiparesis Extremities:  Without edema. Neurologic:  Alert and  oriented x4. Psych:  Alert and cooperative. Normal mood and affect.  Intake/Output from previous day: No  intake/output data recorded. Intake/Output this   shift: No intake/output data recorded.   Labs/Studies   Recent Labs Recent Labs    01/09/23 0800 01/09/23 1046 01/09/23 1508  WBC 12.7*  --  12.9*  HGB 7.6* 7.7* 8.3*  HCT 28.5* 28.9* 31.5*  PLT 263  --  264   BMET Recent Labs    01/09/23 0800 01/09/23 1508  NA 141 138  K 3.5 3.9  CL 107 105  CO2 27 26  GLUCOSE 126* 112*  BUN 13 12  CREATININE 0.69 0.77  CALCIUM 9.0 9.1   LFT Recent Labs    01/09/23 0800  PROT 6.3*  ALBUMIN 2.7*  AST 8*  ALT 11  ALKPHOS 46  BILITOT 0.3     Radiology/Studies No results found.   Assessment   Joe Wilkinson is a 61 y.o. year old male with history of DM, seizures, HTN, CVA with left hemiparesis in 2018, multinodular goiter, afib on Eliquis,  constipation, incontinence of bowel and bladder, resident of the [facility] who presented to [place] after routine labs revealed Hgb 7.6, down from 10 in Nov 2023. He was sent to [place] for evaluation.  He is heme positive but without any overt GI bleeding. Recheck H/H this afternoon with Hgb 8.3. Unclear when last complete colonoscopy was performed, as patient states was in [place], [place]. I am unable to see records. I do see where he had an EGD for PEG placement in 2018 at [facility] without obvious abnormalities. No FH colon cancer or polyps.   In setting of Eliquis, could have occult GI bleeding anywhere in GI tract. Recommend colonoscopy/EGD on Wednesday, allowing washout period of Eliquis.   Would also recommend holding oral semaglutide while inpatient in preparation for anesthesia.     Plan / Recommendations    Clear liquids today and tomorrow Anticipate colonoscopy/EGD on Wednesday Hold Eliquis PPI daily Follow H/H Hold Rybelsus while inpatient     01/09/2023, 4:44 PM  Brigit Doke W. Weiland Tomich, PhD, ANP-BC [facility]      

## 2023-01-09 NOTE — Assessment & Plan Note (Signed)
Hgb 8.3, baseline over the past year has ranged from 10 - 11.  Stool occult positive.  Denies melena or hematochezia, no hematemesis. -Valuated by GI, plan for colonoscopy/EGD -Repeat CBC in a.m. -IV Protonix 40 daily -Hold Eliquis -Clear liquid diet

## 2023-01-09 NOTE — Assessment & Plan Note (Signed)
Rate controlled and on anticoagulation with Eliquis. -Hold Eliquis

## 2023-01-09 NOTE — ED Notes (Signed)
Pt consented to POC occult blood, POC occult blood was positive, PA-C made aware

## 2023-01-09 NOTE — ED Notes (Addendum)
Pt states he knows when he has to use the BR

## 2023-01-09 NOTE — ED Provider Notes (Signed)
[facility] AT [facility] Provider Note   CSN: [id number] Arrival date & time: 01/09/23  1148     History  Chief Complaint  Patient presents with   Abnormal Lab    Joe Wilkinson is a 62 y.o. male with a history including hypertension, seizure disorder, A-fib, type 2 diabetes, CVA x 2 with residual left-sided weakness, wheelchair-bound presenting from a local nursing home secondary to significant anemia.  He had routine blood work done at the nursing home and had a hemoglobin of 7.7.  Patient denies any complaints, specifically no weakness, shortness of breath, chest pain.  He denies bloody or dark stools.  He takes Eliquis, no other NSAIDs, aspirin, denies history of acid reflux or GERD.  The history is provided by the patient.       Home Medications Prior to Admission medications   Medication Sig Start Date End Date Taking? Authorizing Provider  acetaminophen (TYLENOL) 325 MG tablet Take 650 mg by mouth as directed. Every 4-6 hours as needed, not to exceed 3 grams in 24 hours    [provider]  apixaban (ELIQUIS) 5 MG TABS tablet Take 5 mg by mouth 2 (two) times daily.  06/18/18   [provider]  atorvastatin (LIPITOR) 20 MG tablet Take 20 mg by mouth at bedtime.    [provider]  Cholecalciferol 1.25 MG (50000 UT) capsule Take 50,000 Units by mouth once a week. For Low Vitamin D Level on Monday 01/07/20   [provider]  Dextromethorphan-quiNIDine (NUEDEXTA) 20-10 MG capsule Take 1 capsule by mouth 2 (two) times daily. 03/31/22 08/08/48  [provider]  docusate (COLACE) 50 MG/5ML liquid Take 100 mg by mouth at bedtime.    [provider]  Ensure Max Protein (ENSURE MAX PROTEIN) LIQD Take 11 oz by mouth daily. 9 am    [provider]  gabapentin (NEURONTIN) 100 MG capsule Take 100 mg by mouth at bedtime.    [provider]  levETIRAcetam (KEPPRA XR) 500 MG 24 hr tablet Take 500 mg  by mouth daily. At bedtime 8 pm    [provider]  Menthol, Topical Analgesic, (BIOFREEZE) 4 % GEL Apply 1 application  topically at bedtime as needed. Apply to left shoulder    [provider]  metFORMIN (GLUCOPHAGE-XR) 500 MG 24 hr tablet Take 500 mg by mouth daily with breakfast.    [provider]  NON FORMULARY Diet:Regular 04/29/19   [provider]  omeprazole (PRILOSEC) 40 MG capsule Take 40 mg by mouth daily.     [provider]  polyethylene glycol (MIRALAX / GLYCOLAX) 17 g packet Take 17 g by mouth daily. 9 pm    [provider]  QUEtiapine (SEROQUEL) 100 MG tablet Take 100 mg by mouth 2 (two) times daily.    [provider]  Semaglutide (RYBELSUS) 7 MG TABS Take 7 mg by mouth daily at 2 PM.    [provider]      Allergies    Cheese and Penicillins    Review of Systems   Review of Systems  Constitutional:  Negative for chills and fever.  HENT:  Negative for congestion and sore throat.   Eyes: Negative.   Respiratory:  Negative for chest tightness and shortness of breath.   Cardiovascular:  Negative for chest pain.  Gastrointestinal:  Positive for constipation. Negative for abdominal pain, blood in stool, nausea and vomiting.       Describes chronic constipation.  Genitourinary: Negative.   Musculoskeletal:  Negative for arthralgias, joint swelling and neck pain.  Skin: Negative.  Negative for rash and wound.  Neurological:  Negative for dizziness, weakness, light-headedness, numbness and headaches.  Psychiatric/Behavioral: Negative.    All other systems reviewed and are negative.   Physical Exam Updated Vital Signs BP 101/87   Pulse 100   Temp 97.9 F (36.6 C) (Oral)   Resp 17   Ht 5\' 6"  (1.676 m)   Wt 80 kg   SpO2 93%   BMI 28.47 kg/m  Physical Exam Vitals and nursing note reviewed. Exam conducted with a chaperone present.  Constitutional:      Appearance: He is well-developed.  HENT:      Head: Normocephalic and atraumatic.  Eyes:     Conjunctiva/sclera: Conjunctivae normal.  Cardiovascular:     Rate and Rhythm: Normal rate and regular rhythm.     Heart sounds: Normal heart sounds.  Pulmonary:     Effort: Pulmonary effort is normal.     Breath sounds: Normal breath sounds. No wheezing.  Abdominal:     General: Abdomen is protuberant. Bowel sounds are normal.     Palpations: Abdomen is soft.     Tenderness: There is no abdominal tenderness. There is no guarding.  Genitourinary:    Rectum: Guaiac result positive. No external hemorrhoid.     Comments: No rectal impaction on digital rectal exam.  Stool is brown, no visible blood but is Hemoccult positive. Musculoskeletal:        General: Normal range of motion.     Cervical back: Normal range of motion.  Skin:    General: Skin is warm and dry.  Neurological:     Mental Status: He is alert.     ED Results / Procedures / Treatments   Labs (all labs ordered are listed, but only abnormal results are displayed) Labs Reviewed  POC OCCULT BLOOD, ED - Abnormal; Notable for the following components:      Result Value   Fecal Occult Bld POSITIVE (*)    All other components within normal limits  CBC WITH DIFFERENTIAL/PLATELET  BASIC METABOLIC PANEL  TYPE AND SCREEN    EKG None  Radiology No results found.  Procedures Procedures    Medications Ordered in ED Medications  pantoprazole (PROTONIX) injection 40 mg (has no administration in time range)    ED Course/ Medical Decision Making/ A&P                             Medical Decision Making Patient presenting with significant anemia of unclear etiology.  He is Hemoccult only positive today, probable source.  This appears to be a lower GI bleed as his BUN and creatinine are normal range per nursing home lab result.  Currently pending repeat of CBC and be met.  Type and screen is also been ordered.  Patient is on Eliquis secondary to atrial fibrillation.   He is not on any other antiplatelets, no aspirin.  Amount and/or Complexity of Data Reviewed Labs: ordered.    Details: Hemoccult positive.  Repeat hemoglobin here is a little bit improved at 8.3, this is a microcytic anemia.  He has a WBC count of 12.9.  His be met is unremarkable.  Holding on ordering blood transfusion at this time given hemoglobin is over 8 and patient is asymptomatic. Discussion of management or test interpretation with external provider(s): Discussed with Dr. Marletta Lor of GI, agrees  with admission to hospitalist service, add Protonix now, he will consult this patient.  Discussed patient with Dr. Mariea Clonts who accepts patient for admission.  Risk Decision regarding hospitalization.           Final Clinical Impression(s) / ED Diagnoses Final diagnoses:  Gastrointestinal hemorrhage, unspecified gastrointestinal hemorrhage type  Anemia, unspecified type    Rx / DC Orders ED Discharge Orders     None         Victoriano Lain 01/09/23 1649    Bethann Berkshire, MD 01/12/23 1053

## 2023-01-09 NOTE — Assessment & Plan Note (Signed)
Resume Seroquel 

## 2023-01-09 NOTE — ED Notes (Signed)
Pt refused POC occult blood and rectal exam

## 2023-01-09 NOTE — H&P (Signed)
History and Physical    Joe Wilkinson WUJ:[id number] DOB: Sep 01, 1960 DOA: 01/09/2023  PCP: Sharee Holster, NP   Patient coming from: [facility]   I have personally briefly reviewed patient's old medical records in [facility]  Chief Complaint: Low Hgb  HPI: Joe Wilkinson is a 63 y.o. male with medical history significant for  DM, Atria fib, seizures, HTN, stroke, pseudobulbar affect.  Patient presented to ED from nursing home with reports of low hemoglobin.  Patient denies black stools, no blood in stools, no vomiting of blood.  No abdominal pain.  Denies weakness or difficulty breathing at rest or with exertion.  No chest pain.  He has no complaints.  Routine blood work was drawn today.  Hemoglobin was 7.6, and was confirmed with repeat H&H - hgb was 7.7  ED Course: Stable vitals.  Repeat hemoglobin in the ED is 8.3.  GI consulted, anticipate colonoscopy/EGD on Wednesday.   Review of Systems: As per HPI all other systems reviewed and negative.  Past Medical History:  Diagnosis Date   A-fib (HCC)    DM (diabetes mellitus) (HCC)    History of seizures    Hypertension    Stroke [facility])     Past Surgical History:  Procedure Laterality Date   CHOLECYSTECTOMY     CRANIECTOMY Right 04/01/2017   Procedure: RIGHT DECOMPRESSIVE CRANIECTOMY;  Surgeon: Ditty, Loura Halt, MD;  Location: [facility] OR;  Service: Neurosurgery;  Laterality: Right;   ESOPHAGOGASTRODUODENOSCOPY N/A 04/13/2017   Procedure: ESOPHAGOGASTRODUODENOSCOPY (EGD);  Surgeon: Violeta Gelinas, MD;  Location: [facility] ENDOSCOPY;  Service: General;  Laterality: N/A;  bedside   INCISION AND DRAINAGE PERIRECTAL ABSCESS N/A 09/28/2017   Procedure: IRRIGATION AND DEBRIDEMENT PERIANAL ABSCESS;  Surgeon: Lucretia Roers, MD;  Location: [facility] ORS;  Service: General;  Laterality: N/A;   PEG PLACEMENT N/A 04/13/2017   Procedure: PERCUTANEOUS ENDOSCOPIC GASTROSTOMY (PEG) PLACEMENT;  Surgeon: Violeta Gelinas, MD;  Location: [facility] ENDOSCOPY;   Service: General;  Laterality: N/A;     reports that he has quit smoking. His smoking use included cigarettes. He has never used smokeless tobacco. He reports that he does not drink alcohol and does not use drugs.  Allergies  Allergen Reactions   Cheese    Penicillins Hives    Has patient had a PCN reaction causing immediate rash, facial/tongue/throat swelling, SOB or lightheadedness with hypotension: Unknown Has patient had a PCN reaction causing severe rash involving mucus membranes or skin necrosis: Unknown Has patient had a PCN reaction that required hospitalization: No Has patient had a PCN reaction occurring within the last 10 years: No If all of the above answers are "NO", then may proceed with Cephalosporin use.     Family History  Problem Relation Age of Onset   Hypertension Father    Colon cancer Neg Hx    Colon polyps Neg Hx     Prior to Admission medications   Medication Sig Start Date End Date Taking? Authorizing Provider  acetaminophen (TYLENOL) 325 MG tablet Take 650 mg by mouth as directed. Every 4-6 hours as needed, not to exceed 3 grams in 24 hours   Yes [provider]  apixaban (ELIQUIS) 5 MG TABS tablet Take 5 mg by mouth 2 (two) times daily.  06/18/18  Yes [provider]  atorvastatin (LIPITOR) 20 MG tablet Take 20 mg by mouth at bedtime.   Yes [provider]  Cholecalciferol 1.25 MG (50000 UT) capsule Take 50,000 Units by mouth once a week. For Low  Vitamin D Level on Monday 01/07/20  Yes [provider]  Dextromethorphan-quiNIDine (NUEDEXTA) 20-10 MG capsule Take 1 capsule by mouth 2 (two) times daily. 03/31/22 08/08/48 Yes [provider]  docusate (COLACE) 50 MG/5ML liquid Take 100 mg by mouth at bedtime.   Yes [provider]  gabapentin (NEURONTIN) 100 MG capsule Take 100 mg by mouth at bedtime.   Yes [provider]  levETIRAcetam (KEPPRA XR) 500 MG 24 hr tablet Take 500 mg by mouth daily. At  bedtime 8 pm   Yes [provider]  loratadine (CLARITIN) 10 MG tablet Take 10 mg by mouth daily.   Yes [provider]  LORazepam (ATIVAN) 2 MG/ML concentrated solution Take 0.5 mg by mouth as needed for seizure (injection). As needed for seizures lasting longer than 2 minutes. May give up to 3 doses- one dose every 15 minutes   Yes [provider]  Menthol, Topical Analgesic, (BIOFREEZE) 4 % GEL Apply 1 application  topically at bedtime as needed. Apply to left shoulder   Yes [provider]  metFORMIN (GLUCOPHAGE-XR) 500 MG 24 hr tablet Take 500 mg by mouth daily with breakfast.   Yes [provider]  omeprazole (PRILOSEC) 40 MG capsule Take 40 mg by mouth daily.    Yes [provider]  ondansetron (ZOFRAN-ODT) 8 MG disintegrating tablet Take 8 mg by mouth every 6 (six) hours as needed for nausea or vomiting.   Yes [provider]  polyethylene glycol (MIRALAX / GLYCOLAX) 17 g packet Take 17 g by mouth daily. 9 pm   Yes [provider]  QUEtiapine (SEROQUEL) 100 MG tablet Take 100 mg by mouth at bedtime.   Yes [provider]  Semaglutide (RYBELSUS) 7 MG TABS Take 7 mg by mouth daily at 2 PM.   Yes [provider]  Ensure Max Protein (ENSURE MAX PROTEIN) LIQD Take 11 oz by mouth daily. 9 am    [provider]  NON FORMULARY Diet:Regular 04/29/19   [provider]    Physical Exam: Vitals:   01/09/23 1300 01/09/23 1609 01/09/23 1700 01/09/23 1756  BP: 101/87  110/78 (!) 110/47  Pulse: 100  96 89  Resp: 17  (!) 22 20  Temp:  98 F (36.7 C) 98 F (36.7 C)   TempSrc:  Oral Oral   SpO2: 93%  96% 100%  Weight:      Height:        Constitutional: NAD, calm, comfortable Vitals:   01/09/23 1300 01/09/23 1609 01/09/23 1700 01/09/23 1756  BP: 101/87  110/78 (!) 110/47  Pulse: 100  96 89  Resp: 17  (!) 22 20  Temp:  98 F (36.7 C) 98 F (36.7 C)   TempSrc:  Oral Oral   SpO2: 93%   96% 100%  Weight:      Height:       Eyes: PERRL, lids and conjunctivae normal ENMT: Mucous membranes are moist. Posterior pharynx clear of any exudate or lesions.Normal dentition.  Neck: normal, supple, no masses, no thyromegaly Respiratory: clear to auscultation bilaterally, no wheezing, no crackles. Normal respiratory effort. No accessory muscle use.  Cardiovascular: Regular rate and rhythm, no murmurs / rubs / gallops. No extremity edema.  Extremities warm.  Abdomen: no tenderness, no masses palpated. No hepatosplenomegaly. Bowel sounds positive.  Musculoskeletal: no clubbing / cyanosis.  Left upper extremity in flexed position.  Skin: no rashes, lesions, ulcers. No induration Neurologic: Left upper and lower extremity paresis from  stroke,.  Psychiatric: Normal judgment and insight. Alert and oriented x 3. Normal mood.   Labs on Admission: I have personally reviewed following labs and imaging studies  CBC: Recent Labs  Lab 01/09/23 0800 01/09/23 1046 01/09/23 1508  WBC 12.7*  --  12.9*  NEUTROABS  --   --  7.3  HGB 7.6* 7.7* 8.3*  HCT 28.5* 28.9* 31.5*  MCV 75.4*  --  75.0*  PLT 263  --  264   Basic Metabolic Panel: Recent Labs  Lab 01/09/23 0800 01/09/23 1508  NA 141 138  K 3.5 3.9  CL 107 105  CO2 27 26  GLUCOSE 126* 112*  BUN 13 12  CREATININE 0.69 0.77  CALCIUM 9.0 9.1   GFR: Estimated Creatinine Clearance: 96.4 mL/min (by C-G formula based on SCr of 0.77 mg/dL). Liver Function Tests: Recent Labs  Lab 01/09/23 0800  AST 8*  ALT 11  ALKPHOS 46  BILITOT 0.3  PROT 6.3*  ALBUMIN 2.7*   Lipid Profile: Recent Labs    01/09/23 0800  CHOL 99  HDL 29*  LDLCALC 46  TRIG 161  CHOLHDL 3.4   Thyroid Function Tests: Recent Labs    01/09/23 0800  TSH 1.501   Radiological Exams on Admission: No results found.  EKG: None  Assessment/Plan Principal Problem:   Acute anemia Active Problems:   AF (paroxysmal atrial fibrillation) (HCC)    Hemiparesis affecting left side as late effect of cerebrovascular accident (CVA) (HCC)   Dyslipidemia associated with type 2 diabetes mellitus (HCC)   Hypertension associated with type 2 diabetes mellitus (HCC)   Seizure (HCC)   Vascular dementia (HCC)   Assessment and Plan: * Acute anemia Hgb 8.3, baseline over the past year has ranged from 10 - 11.  Stool occult positive.  Denies melena or hematochezia, no hematemesis. -Valuated by GI, plan for colonoscopy/EGD -Repeat CBC in a.m. -IV Protonix 40 daily -Hold Eliquis -Clear liquid diet  Vascular dementia (HCC) Resume Seroquel  Seizure (HCC) Resume Keppra, gabapentin  Dyslipidemia associated with type 2 diabetes mellitus (HCC) - HgbA1c - SSi- S -Hold metformin, semaglutide  AF (paroxysmal atrial fibrillation) (HCC) Rate controlled and on anticoagulation with Eliquis. -Hold Eliquis  DVT prophylaxis: SCDS Code Status: DNR-ACP documents reviewed, confirmed DNR status with patient at bedside, documents from nursing home at bedside also state DNR Family Communication: None at bedside Disposition Plan: ~ 2 days, back to [facility] Consults called:  GI Admission status:  Obs tele     Author: Onnie Boer, MD 01/09/2023 7:14 PM  For on call review [email or web address].

## 2023-01-10 DIAGNOSIS — Z8719 Personal history of other diseases of the digestive system: Secondary | ICD-10-CM | POA: Diagnosis present

## 2023-01-10 DIAGNOSIS — D6832 Hemorrhagic disorder due to extrinsic circulating anticoagulants: Secondary | ICD-10-CM | POA: Diagnosis present

## 2023-01-10 DIAGNOSIS — T45525A Adverse effect of antithrombotic drugs, initial encounter: Secondary | ICD-10-CM | POA: Diagnosis present

## 2023-01-10 DIAGNOSIS — Z7901 Long term (current) use of anticoagulants: Secondary | ICD-10-CM

## 2023-01-10 DIAGNOSIS — G40909 Epilepsy, unspecified, not intractable, without status epilepticus: Secondary | ICD-10-CM | POA: Diagnosis present

## 2023-01-10 DIAGNOSIS — K5790 Diverticulosis of intestine, part unspecified, without perforation or abscess without bleeding: Secondary | ICD-10-CM | POA: Diagnosis not present

## 2023-01-10 DIAGNOSIS — Z79899 Other long term (current) drug therapy: Secondary | ICD-10-CM | POA: Diagnosis not present

## 2023-01-10 DIAGNOSIS — D123 Benign neoplasm of transverse colon: Secondary | ICD-10-CM | POA: Diagnosis present

## 2023-01-10 DIAGNOSIS — I10 Essential (primary) hypertension: Secondary | ICD-10-CM | POA: Diagnosis present

## 2023-01-10 DIAGNOSIS — I69354 Hemiplegia and hemiparesis following cerebral infarction affecting left non-dominant side: Secondary | ICD-10-CM | POA: Diagnosis not present

## 2023-01-10 DIAGNOSIS — K635 Polyp of colon: Secondary | ICD-10-CM | POA: Diagnosis not present

## 2023-01-10 DIAGNOSIS — R195 Other fecal abnormalities: Secondary | ICD-10-CM

## 2023-01-10 DIAGNOSIS — K922 Gastrointestinal hemorrhage, unspecified: Secondary | ICD-10-CM | POA: Diagnosis present

## 2023-01-10 DIAGNOSIS — Z87891 Personal history of nicotine dependence: Secondary | ICD-10-CM | POA: Diagnosis not present

## 2023-01-10 DIAGNOSIS — D120 Benign neoplasm of cecum: Secondary | ICD-10-CM | POA: Diagnosis present

## 2023-01-10 DIAGNOSIS — Z1152 Encounter for screening for COVID-19: Secondary | ICD-10-CM | POA: Diagnosis not present

## 2023-01-10 DIAGNOSIS — K5730 Diverticulosis of large intestine without perforation or abscess without bleeding: Secondary | ICD-10-CM | POA: Diagnosis present

## 2023-01-10 DIAGNOSIS — E785 Hyperlipidemia, unspecified: Secondary | ICD-10-CM | POA: Diagnosis present

## 2023-01-10 DIAGNOSIS — E1169 Type 2 diabetes mellitus with other specified complication: Secondary | ICD-10-CM | POA: Diagnosis present

## 2023-01-10 DIAGNOSIS — Z993 Dependence on wheelchair: Secondary | ICD-10-CM | POA: Diagnosis not present

## 2023-01-10 DIAGNOSIS — Z8249 Family history of ischemic heart disease and other diseases of the circulatory system: Secondary | ICD-10-CM | POA: Diagnosis not present

## 2023-01-10 DIAGNOSIS — D649 Anemia, unspecified: Secondary | ICD-10-CM | POA: Diagnosis not present

## 2023-01-10 DIAGNOSIS — K5909 Other constipation: Secondary | ICD-10-CM | POA: Diagnosis present

## 2023-01-10 DIAGNOSIS — E559 Vitamin D deficiency, unspecified: Secondary | ICD-10-CM | POA: Diagnosis present

## 2023-01-10 DIAGNOSIS — K9429 Other complications of gastrostomy: Secondary | ICD-10-CM | POA: Diagnosis not present

## 2023-01-10 DIAGNOSIS — Z7984 Long term (current) use of oral hypoglycemic drugs: Secondary | ICD-10-CM | POA: Diagnosis not present

## 2023-01-10 DIAGNOSIS — I480 Paroxysmal atrial fibrillation: Secondary | ICD-10-CM | POA: Diagnosis present

## 2023-01-10 DIAGNOSIS — F0150 Vascular dementia without behavioral disturbance: Secondary | ICD-10-CM | POA: Diagnosis present

## 2023-01-10 DIAGNOSIS — D62 Acute posthemorrhagic anemia: Secondary | ICD-10-CM | POA: Diagnosis present

## 2023-01-10 DIAGNOSIS — I69318 Other symptoms and signs involving cognitive functions following cerebral infarction: Secondary | ICD-10-CM | POA: Diagnosis not present

## 2023-01-10 LAB — HIV ANTIBODY (ROUTINE TESTING W REFLEX): HIV Screen 4th Generation wRfx: NONREACTIVE

## 2023-01-10 LAB — CBC
HCT: 28.6 % — ABNORMAL LOW (ref 39.0–52.0)
Hemoglobin: 7.7 g/dL — ABNORMAL LOW (ref 13.0–17.0)
MCH: 20.1 pg — ABNORMAL LOW (ref 26.0–34.0)
MCHC: 26.9 g/dL — ABNORMAL LOW (ref 30.0–36.0)
MCV: 74.5 fL — ABNORMAL LOW (ref 80.0–100.0)
Platelets: 268 10*3/uL (ref 150–400)
RBC: 3.84 MIL/uL — ABNORMAL LOW (ref 4.22–5.81)
RDW: 21 % — ABNORMAL HIGH (ref 11.5–15.5)
WBC: 13 10*3/uL — ABNORMAL HIGH (ref 4.0–10.5)
nRBC: 0.3 % — ABNORMAL HIGH (ref 0.0–0.2)

## 2023-01-10 LAB — HEMOGLOBIN AND HEMATOCRIT, BLOOD
HCT: 29.4 % — ABNORMAL LOW (ref 39.0–52.0)
HCT: 31.4 % — ABNORMAL LOW (ref 39.0–52.0)
Hemoglobin: 7.7 g/dL — ABNORMAL LOW (ref 13.0–17.0)
Hemoglobin: 8.4 g/dL — ABNORMAL LOW (ref 13.0–17.0)

## 2023-01-10 LAB — GLUCOSE, CAPILLARY
Glucose-Capillary: 122 mg/dL — ABNORMAL HIGH (ref 70–99)
Glucose-Capillary: 108 mg/dL — ABNORMAL HIGH (ref 70–99)
Glucose-Capillary: 108 mg/dL — ABNORMAL HIGH (ref 70–99)
Glucose-Capillary: 92 mg/dL (ref 70–99)

## 2023-01-10 LAB — FERRITIN: Ferritin: 4 ng/mL — ABNORMAL LOW (ref 24–336)

## 2023-01-10 LAB — IRON AND TIBC
Iron: 18 ug/dL — ABNORMAL LOW (ref 45–182)
Saturation Ratios: 5 % — ABNORMAL LOW (ref 17.9–39.5)
TIBC: 374 ug/dL (ref 250–450)
UIBC: 356 ug/dL

## 2023-01-10 MED ORDER — PEG 3350-KCL-NA BICARB-NACL 420 G PO SOLR
4000.0000 mL | Freq: Once | ORAL | Status: AC
  Administered 2023-01-10: 4000 mL via ORAL

## 2023-01-10 MED ORDER — SODIUM CHLORIDE 0.9 % IV SOLN: INTRAVENOUS | Status: DC

## 2023-01-10 MED ORDER — LORAZEPAM 0.5 MG PO TABS: 0.5000 mg | ORAL_TABLET | Freq: Two times a day (BID) | ORAL | Status: DC | PRN

## 2023-01-10 MED ORDER — ATORVASTATIN CALCIUM 40 MG PO TABS
40.0000 mg | ORAL_TABLET | Freq: Every day | ORAL | Status: DC
  Administered 2023-01-10 – 2023-01-12 (×3): 40 mg via ORAL
  Filled 2023-01-10 (×3): qty 1

## 2023-01-10 MED ORDER — GUAIFENESIN-DM 100-10 MG/5ML PO SYRP
5.0000 mL | ORAL_SOLUTION | ORAL | Status: DC | PRN
  Administered 2023-01-10: 5 mL via ORAL
  Filled 2023-01-10: qty 5

## 2023-01-10 NOTE — Progress Notes (Addendum)
Pt refusing to be DNR. Pt stated he wanted everything done to bring him back   01/09/23 2156  Provider Notification  Provider Name/Title Adefeso  Date Provider Notified 01/09/23  Time Provider Notified 2156  Method of Notification  (amion)  Notification Reason Requested by patient/family  Provider response Other (Comment) (provide to come to bedside)  Date of Provider Response 01/09/23  Time of Provider Response 2217

## 2023-01-10 NOTE — Progress Notes (Signed)
RN called due to patient's intent to change his CODE STATUS from DNR to full code.  On review of patient's ACP in the chart, it was noted that he has a DNR order form.  At bedside, patient was alert and oriented x 4.  He reconfirmed his symptoms to change in status from DNR to full code.  Tamsen Meek, RN was a witness.

## 2023-01-10 NOTE — Progress Notes (Signed)
PROGRESS NOTE  Joe Wilkinson, is a 62 y.o. male, DOB - 28-Dec-1960, UJW:[id number]  Admit date - 01/09/2023   Admitting Physician Brock Larmon Mariea Clonts, MD  Outpatient Primary MD for the patient is Sharee Holster, NP  LOS - 0  Chief Complaint  Patient presents with   Abnormal Lab        Brief Narrative:  62 y.o. 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 admitted on 01/09/2023 with concerns about acute GI bleed and drop in hemoglobin    -Assessment and Plan: 1) acute on chronic anemia---POA -Due to presumed acute GI bleed in the setting of chronic Eliquis use -Hgb is down to 7.7 baseline usually between 10 and 11 GI evaluation appreciated -Continue IV Protonix 40 daily -Hold Eliquis -Clear liquid diet -Plans for EGD and colonoscopy on 01/11/2023  2) history of prior stroke with vascular dementia (HCC) -Hold Eliquis -Restart atorvastatin  3)Seizure (HCC) Continue gabapentin and Keppra Keppra,   -Lorazepam as needed  4)DM2-  Prior A1c 5.3 reflecting excellent diabetic control PTA -Hold metformin, semaglutide--- while n.p.o. and preparing for EGD and colonoscopy Use Novolog/Humalog Sliding scale insulin with Accu-Cheks/Fingersticks as ordered   5)AF (paroxysmal atrial fibrillation) (HCC) No AV nodal blocking agent -Hold Eliquis  6)Leukocytosis--??  Reactive -No fevers or other evidence of infection at this time -History of chronic elevated WBC  Status is: Inpatient   Disposition: The patient is from: SNF              Anticipated d/c is to: SNF              Anticipated d/c date is: 2 days              Patient currently is not medically stable to d/c. Barriers: Not Clinically Stable-   Code Status :  -  Code Status: Full Code   Family Communication:    (patient is alert, awake and coherent)  -sister [name] is contact  DVT  Prophylaxis  :   - SCDs  SCDs Start: 01/09/23 1919   Lab Results  Component Value Date   PLT 268 01/10/2023   Inpatient Medications  Scheduled Meds:  gabapentin  100 mg Oral QHS   insulin aspart  0-5 Units Subcutaneous QHS   insulin aspart  0-9 Units Subcutaneous TID WC   levETIRAcetam  500 mg Oral Q24H   pantoprazole (PROTONIX) IV  40 mg Intravenous Q24H   QUEtiapine  100 mg Oral QHS   Continuous Infusions:  sodium chloride Stopped (01/10/23 1707)   PRN Meds:.acetaminophen **OR** acetaminophen, guaiFENesin-dextromethorphan, ondansetron **OR** ondansetron (ZOFRAN) IV, polyethylene glycol   Anti-infectives (From admission, onward)    None       Subjective: Janina Mayo today has no fevers, no emesis,  No chest pain,   -Tolerating clear liquids okay -Wondering when he can have solid food   Objective: Vitals:   01/09/23 2123 01/10/23 0125 01/10/23 0525 01/10/23 1313  BP: (!) 95/41 111/71 104/69 110/69  Pulse: 77  94 89  Resp: 20 18 18 18   Temp: 98.2 F (36.8 C) 98.1 F (36.7 C) 98.7 F (37.1 C) 98.6 F (37 C)  TempSrc: Oral  Oral Oral  SpO2: 91% 98% 95% 96%  Weight:      Height:        Intake/Output Summary (Last 24 hours) at 01/10/2023 1723 Last data filed at 01/10/2023 1300 Gross per  24 hour  Intake 960 ml  Output 425 ml  Net 535 ml   Filed Weights   01/09/23 1201  Weight: 80 kg   Physical Exam Gen:- Awake Alert, no acute distress HEENT:- [place].AT, No sclera icterus Neck-Supple Neck,No JVD,.  Lungs-  CTAB , fair symmetrical air movement CV- S1, S2 normal, regular  Abd-  +ve B.Sounds, Abd Soft, No tenderness,    Extremity/Skin:- No edema, pedal pulses present  Psych-affect is appropriate, oriented x3 Neuro-left-sided hemiplegia with left upper extremity contracture, no additional New focal deficits, no tremors  Data Reviewed: I have personally reviewed following labs and imaging studies  CBC: Recent Labs  Lab 01/09/23 0800 01/09/23 1046  01/09/23 1508 01/10/23 0422 01/10/23 1038 01/10/23 1518  WBC 12.7*  --  12.9* 13.0*  --   --   NEUTROABS  --   --  7.3  --   --   --   HGB 7.6* 7.7* 8.3* 7.7* 7.7* 8.4*  HCT 28.5* 28.9* 31.5* 28.6* 29.4* 31.4*  MCV 75.4*  --  75.0* 74.5*  --   --   PLT 263  --  264 268  --   --    Basic Metabolic Panel: Recent Labs  Lab 01/09/23 0800 01/09/23 1508  NA 141 138  K 3.5 3.9  CL 107 105  CO2 27 26  GLUCOSE 126* 112*  BUN 13 12  CREATININE 0.69 0.77  CALCIUM 9.0 9.1   GFR: Estimated Creatinine Clearance: 96.4 mL/min (by C-G formula based on SCr of 0.77 mg/dL). Liver Function Tests: Recent Labs  Lab 01/09/23 0800  AST 8*  ALT 11  ALKPHOS 46  BILITOT 0.3  PROT 6.3*  ALBUMIN 2.7*   Radiology Studies: No results found.  Scheduled Meds:  gabapentin  100 mg Oral QHS   insulin aspart  0-5 Units Subcutaneous QHS   insulin aspart  0-9 Units Subcutaneous TID WC   levETIRAcetam  500 mg Oral Q24H   pantoprazole (PROTONIX) IV  40 mg Intravenous Q24H   QUEtiapine  100 mg Oral QHS   Continuous Infusions:  sodium chloride Stopped (01/10/23 1707)    LOS: 0 days   Shon Hale M.D on 01/10/2023 at 5:23 PM  Go to [email or web address] - for contact info  [facility] - Office  [phone]  If 7PM-7AM, please contact night-coverage [email or web address] 01/10/2023, 5:23 PM

## 2023-01-10 NOTE — Progress Notes (Addendum)
Subjective: No complaints for me today other than he doesn't like clear liquids. No abdominal pain, brbpr, melena, nausea, vomiting, heartburn, dysphagia symptoms. Reports 2 Bms last night.   Objective: Vital signs in last 24 hours: Temp:  [97.9 F (36.6 C)-98.7 F (37.1 C)] 98.7 F (37.1 C) (06/04 0525) Pulse Rate:  [61-103] 94 (06/04 0525) Resp:  [16-22] 18 (06/04 0525) BP: (95-130)/(41-87) 104/69 (06/04 0525) SpO2:  [91 %-100 %] 95 % (06/04 0525) Weight:  [80 kg] 80 kg (06/03 1201) Last BM Date : 01/09/23 General:   Alert and oriented, pleasant Head:  Normocephalic and atraumatic. Eyes:  No icterus, sclera clear. Conjuctiva pink.  Abdomen:  Bowel sounds present, soft, non-tender, non-distended. No rebound or guarding. No masses appreciated  Extremities:  Without edema. Neurologic:  Alert and  oriented x4 Psych:  Normal mood and affect.  Intake/Output from previous day: No intake/output data recorded. Intake/Output this shift: No intake/output data recorded.  Lab Results: Recent Labs    01/09/23 0800 01/09/23 1046 01/09/23 1508 01/10/23 0422  WBC 12.7*  --  12.9* 13.0*  HGB 7.6* 7.7* 8.3* 7.7*  HCT 28.5* 28.9* 31.5* 28.6*  PLT 263  --  264 268   BMET Recent Labs    01/09/23 0800 01/09/23 1508  NA 141 138  K 3.5 3.9  CL 107 105  CO2 27 26  GLUCOSE 126* 112*  BUN 13 12  CREATININE 0.69 0.77  CALCIUM 9.0 9.1   LFT Recent Labs    01/09/23 0800  PROT 6.3*  ALBUMIN 2.7*  AST 8*  ALT 11  ALKPHOS 46  BILITOT 0.3     Assessment:  62 y.o. year old male with history of DM, seizures, HTN, CVA with left hemiparesis in 2018, multinodular goiter, afib on Eliquis,  constipation, incontinence of bowel and bladder, resident of the [facility] who presented to [facility] after routine labs revealed Hgb 7.6, down from 10 in Nov 2023. He was sent to [facility] for evaluation.  Acute on chronic anemia:  Hemoglobin in the emergency room was 8.3.  Found to be  heme positive, but no overt GI bleeding.  Repeat hemoglobin this morning 7.7. No overt GI bleeding overnight. Chronically on omeprazole 40 mg daily, currently on Protonix 40 mg daily. He has denied any significant GI symptoms. Chronic constipation is well controlled. Unclear when patient's last colonoscopy was.  No known family history of colon cancer or polyps. EGD for PEG placement in 2018 at [facility] without obvious abnormalities.   In the setting of anticoagulation, could have occult GI bleeding from anywhere in his GI tract.  We have recommended EGD and colonoscopy to be performed tomorrow to allow washout period of Eliquis.  Plan: Upper endoscopy and colonoscopy with propofol by Dr. Jena Gauss tomorrow. The risks, benefits, and alternatives have been discussed with the patient in detail. The patient states understanding and desires to proceed.  Continue to hold Eliquis. Continue clear liquids today. N.p.o. at midnight. Will start bowel prep this afternoon. Continue to monitor H&H closely and transfuse as needed. Monitor for overt GI bleeding. Check iron levels.  Continue daily PPI.    LOS: 0 days    01/10/2023, 8:12 AM   Ermalinda Memos, PA-C [facility]   Addendum: Received staff message from Synthia Innocent, NP at [facility] stating that patient is not alert and oriented.  Stated that he scored a 13 out of 30 on his SLUMS test completed 12-15-22 indicating dementia.  She requested  that we not allow him to sign or agree to anything on his own as he does not have that capacity based upon the score.    I reached out to his sister [name] at [phone] who is agreeable for patient to proceed with EGD and colonoscopy. I advised that nursing staff would be reaching out to get consent as well.  She stated that we can call this number anytime.   Ermalinda Memos, PA-C [facility]

## 2023-01-10 NOTE — [place] FL2 (Signed)
[place] MEDICAID FL2 LEVEL OF CARE FORM     IDENTIFICATION  Patient Name: Joe Wilkinson Birthdate: September 19, 1960 Sex: male Admission Date (Current Location): 01/09/2023  [place] and [place] Number:  Aaron Edelman [id number] [facility] Facility and Address:  [facility],  [address], [place] [address]      Provider Number: [id number]  Attending Physician Name and Address:  Shon Hale, MD  Relative Name and Phone Number:  [name] (Sister) [phone]    Current Level of Care: Hospital (observation) Recommended Level of Care: Skilled Nursing Facility Prior Approval Number:    Date Approved/Denied:   PASRR Number:    Discharge Plan: SNF    Current Diagnoses: Patient Active Problem List   Diagnosis Date Noted   Chronic anticoagulation 01/10/2023   Heme positive stool 01/10/2023   Acute anemia 01/09/2023   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   Vascular dementia (HCC) 12/28/2021   Paranoia (HCC) 09/15/2021   Delusion (HCC) 09/09/2021   Multinodular goiter 05/07/2021   Hypochromic anemia 04/01/2021   Protein-calorie malnutrition, severe (HCC) 11/09/2020   Seizure (HCC) 03/28/2020   Hypertension associated with type 2 diabetes mellitus (HCC) 01/24/2019   GERD without esophagitis 11/03/2018   Dyslipidemia associated with type 2 diabetes mellitus (HCC) 11/03/2018   Diabetic peripheral neuropathy (HCC) 11/03/2018   Chronic constipation 11/03/2018   History of CVA in adulthood 04/12/2018   Hemiparesis affecting left side as late effect of cerebrovascular accident (CVA) (HCC)    Vitamin D deficiency 09/13/2017   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   Type 2 diabetes mellitus with neurological complications (HCC)    AF (paroxysmal atrial fibrillation) (HCC) 04/27/2017   Cytotoxic cerebral edema (HCC) 04/01/2017    Orientation RESPIRATION BLADDER Height & Weight     Self, Time, Situation, Place  Normal Incontinent Weight: 176 lb 5.9 oz (80 kg) Height:   5\' 6"  (167.6 cm)  BEHAVIORAL SYMPTOMS/MOOD NEUROLOGICAL BOWEL NUTRITION STATUS      Incontinent Diet (see d/c summary)  AMBULATORY STATUS COMMUNICATION OF NEEDS Skin   Total Care (uses wheelchair) Verbally Normal                       Personal Care Assistance Level of Assistance  Bathing, Feeding, Dressing Bathing Assistance: Limited assistance Feeding assistance: Independent Dressing Assistance: Limited assistance     Functional Limitations Info  Sight, Hearing, Speech Sight Info: Adequate Hearing Info: Adequate Speech Info: Adequate    SPECIAL CARE FACTORS FREQUENCY                       Contractures Contractures Info: Not present    Additional Factors Info  Code Status, Allergies, Psychotropic Code Status Info: Full Allergies Info: Cheese, Penicillins Psychotropic Info: Ativan, Seroquel         Current Medications (01/10/2023):  This is the current hospital active medication list Current Facility-Administered Medications  Medication Dose Route Frequency Provider Last Rate Last Admin   acetaminophen (TYLENOL) tablet 650 mg  650 mg Oral Q6H PRN Emokpae, Ejiroghene E, MD       Or   acetaminophen (TYLENOL) suppository 650 mg  650 mg Rectal Q6H PRN Emokpae, Ejiroghene E, MD       gabapentin (NEURONTIN) capsule 100 mg  100 mg Oral QHS Emokpae, Ejiroghene E, MD   100 mg at 01/09/23 2140   guaiFENesin-dextromethorphan (ROBITUSSIN DM) 100-10 MG/5ML syrup 5 mL  5 mL Oral Q4H PRN Emokpae, Ejiroghene E, MD   5 mL at 01/10/23 0119  insulin aspart (novoLOG) injection 0-5 Units  0-5 Units Subcutaneous QHS Emokpae, Ejiroghene E, MD       insulin aspart (novoLOG) injection 0-9 Units  0-9 Units Subcutaneous TID WC Emokpae, Ejiroghene E, MD   1 Units at 01/10/23 0808   levETIRAcetam (KEPPRA XR) 24 hr tablet 500 mg  500 mg Oral Q24H Emokpae, Ejiroghene E, MD   500 mg at 01/09/23 2140   ondansetron (ZOFRAN) tablet 4 mg  4 mg Oral Q6H PRN Emokpae, Ejiroghene E, MD       Or    ondansetron (ZOFRAN) injection 4 mg  4 mg Intravenous Q6H PRN Emokpae, Ejiroghene E, MD       pantoprazole (PROTONIX) injection 40 mg  40 mg Intravenous Q24H Emokpae, Ejiroghene E, MD       polyethylene glycol (MIRALAX / GLYCOLAX) packet 17 g  17 g Oral Daily PRN Emokpae, Ejiroghene E, MD       QUEtiapine (SEROQUEL) tablet 100 mg  100 mg Oral QHS Emokpae, Ejiroghene E, MD   100 mg at 01/09/23 2139     Discharge Medications: Please see discharge summary for a list of discharge medications.  Relevant Imaging Results:  Relevant Lab Results:   Additional Information SS#: [id number]  Annice Needy, LCSW

## 2023-01-10 NOTE — TOC Initial Note (Signed)
Transition of Care [facility]) - Initial/Assessment Note    Patient Details  Name: Joe Wilkinson MRN: [id number] Date of Birth: April 09, 1961  Transition of [facility] CM/SW Contact:    Annice Needy, LCSW Phone Number: 01/10/2023, 11:36 AM  Clinical Narrative:                 Patient is a LTC resident at [facility]. He has been there since 2018 and was placed initially as a difficult to place patient. Admitted for acute anemia. Uses a wheelchair that he self-propels backwards, he cannot self-propel forward. Feeds himself. ADL assistance with bathing, toileting, and grooming. Can return to the facility at d/c. Has a sister who lives out of stated.   Expected Discharge Plan: Skilled Nursing Facility Barriers to Discharge: Continued Medical Work up   Patient Goals and CMS Choice Patient states their goals for this hospitalization and ongoing recovery are:: return to facility          Expected Discharge Plan and Services                                              Prior Living Arrangements/Services   Lives with:: Facility Resident              Current home services: DME (wheelchair)    Activities of Daily Living Home Assistive Devices/Equipment: None ADL Screening (condition at time of admission) Patient's cognitive ability adequate to safely complete daily activities?: Yes Is the patient deaf or have difficulty hearing?: No Does the patient have difficulty seeing, even when wearing glasses/contacts?: No Does the patient have difficulty concentrating, remembering, or making decisions?: No Patient able to express need for assistance with ADLs?: Yes Does the patient have difficulty dressing or bathing?: Yes Independently performs ADLs?: No Communication: Independent Dressing (OT): Dependent Is this a change from baseline?: Pre-admission baseline Grooming: Dependent Is this a change from baseline?: Pre-admission baseline Feeding: Needs assistance Is this a change from  baseline?: Pre-admission baseline Bathing: Dependent Is this a change from baseline?: Pre-admission baseline Toileting: Dependent Is this a change from baseline?: Pre-admission baseline In/Out Bed: Dependent Is this a change from baseline?: Pre-admission baseline Walks in Home: Dependent Is this a change from baseline?: Pre-admission baseline Does the patient have difficulty walking or climbing stairs?: Yes Weakness of Legs: Both Weakness of Arms/Hands: Both  Permission Sought/Granted Permission sought to share information with : Facility Medical sales representative    Share Information with NAME: Keri with [facility]           Emotional Assessment       Orientation: : Oriented to Self, Oriented to Place, Oriented to  Time, Oriented to Situation Alcohol / Substance Use: Not Applicable Psych Involvement: No (comment)  Admission diagnosis:  Gastrointestinal hemorrhage, unspecified gastrointestinal hemorrhage type [K92.2] Anemia, unspecified type [D64.9] Acute anemia [D64.9] Patient Active Problem List   Diagnosis Date Noted   Chronic anticoagulation 01/10/2023   Heme positive stool 01/10/2023   Acute anemia 01/09/2023   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   Vascular dementia (HCC) 12/28/2021   Paranoia (HCC) 09/15/2021   Delusion (HCC) 09/09/2021   Multinodular goiter 05/07/2021   Hypochromic anemia 04/01/2021   Protein-calorie malnutrition, severe (HCC) 11/09/2020   Seizure (HCC) 03/28/2020   Hypertension associated with type 2 diabetes mellitus (HCC) 01/24/2019   GERD without esophagitis 11/03/2018   Dyslipidemia associated with type 2 diabetes  mellitus (HCC) 11/03/2018   Diabetic peripheral neuropathy (HCC) 11/03/2018   Chronic constipation 11/03/2018   History of CVA in adulthood 04/12/2018   Hemiparesis affecting left side as late effect of cerebrovascular accident (CVA) (HCC)    Vitamin D deficiency 09/13/2017   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 [place] [address] Phone: [phone] Fax: [phone]     Social Determinants of Health (SDOH) Social History: SDOH Screenings   Food Insecurity: No Food Insecurity (01/09/2023)  Housing: Low Risk  (01/09/2023)  Transportation Needs: No Transportation Needs (01/09/2023)  Utilities: Not At Risk (01/09/2023)  Depression (PHQ2-9): Low Risk  (12/14/2022)  Tobacco Use: Medium Risk (01/09/2023)   SDOH Interventions:     Readmission Risk Interventions     No data to display

## 2023-01-10 NOTE — Progress Notes (Signed)
Consent signed Via phone from sister [name] Verified by [place] Tonna Boehringer RN.

## 2023-01-11 ENCOUNTER — Inpatient Hospital Stay (HOSPITAL_COMMUNITY): Payer: Medicaid Other | Admitting: Certified Registered Nurse Anesthetist

## 2023-01-11 ENCOUNTER — Encounter (HOSPITAL_COMMUNITY)
Admission: EM | Disposition: A | Discharge status: Skilled Nursing Facility | Payer: Self-pay | Source: Home / Self Care | Attending: Family Medicine

## 2023-01-11 ENCOUNTER — Encounter (HOSPITAL_COMMUNITY): Payer: Self-pay | Admitting: Family Medicine

## 2023-01-11 DIAGNOSIS — D123 Benign neoplasm of transverse colon: Secondary | ICD-10-CM | POA: Diagnosis not present

## 2023-01-11 DIAGNOSIS — D649 Anemia, unspecified: Secondary | ICD-10-CM | POA: Diagnosis not present

## 2023-01-11 DIAGNOSIS — R195 Other fecal abnormalities: Secondary | ICD-10-CM | POA: Diagnosis not present

## 2023-01-11 DIAGNOSIS — K5730 Diverticulosis of large intestine without perforation or abscess without bleeding: Secondary | ICD-10-CM

## 2023-01-11 DIAGNOSIS — D120 Benign neoplasm of cecum: Secondary | ICD-10-CM

## 2023-01-11 DIAGNOSIS — K635 Polyp of colon: Secondary | ICD-10-CM

## 2023-01-11 DIAGNOSIS — K5790 Diverticulosis of intestine, part unspecified, without perforation or abscess without bleeding: Secondary | ICD-10-CM

## 2023-01-11 DIAGNOSIS — I10 Essential (primary) hypertension: Secondary | ICD-10-CM

## 2023-01-11 DIAGNOSIS — Z87891 Personal history of nicotine dependence: Secondary | ICD-10-CM

## 2023-01-11 DIAGNOSIS — K9429 Other complications of gastrostomy: Secondary | ICD-10-CM

## 2023-01-11 HISTORY — PX: POLYPECTOMY: SHX149

## 2023-01-11 HISTORY — PX: COLONOSCOPY WITH PROPOFOL: SHX5780

## 2023-01-11 HISTORY — PX: ESOPHAGOGASTRODUODENOSCOPY (EGD) WITH PROPOFOL: SHX5813

## 2023-01-11 LAB — CBC
HCT: 29.7 % — ABNORMAL LOW (ref 39.0–52.0)
Hemoglobin: 8.2 g/dL — ABNORMAL LOW (ref 13.0–17.0)
MCH: 20.1 pg — ABNORMAL LOW (ref 26.0–34.0)
MCHC: 27.6 g/dL — ABNORMAL LOW (ref 30.0–36.0)
MCV: 72.8 fL — ABNORMAL LOW (ref 80.0–100.0)
Platelets: 272 10*3/uL (ref 150–400)
RBC: 4.08 MIL/uL — ABNORMAL LOW (ref 4.22–5.81)
RDW: 21 % — ABNORMAL HIGH (ref 11.5–15.5)
WBC: 11.1 10*3/uL — ABNORMAL HIGH (ref 4.0–10.5)
nRBC: 0.2 % (ref 0.0–0.2)

## 2023-01-11 LAB — GLUCOSE, CAPILLARY
Glucose-Capillary: 123 mg/dL — ABNORMAL HIGH (ref 70–99)
Glucose-Capillary: 112 mg/dL — ABNORMAL HIGH (ref 70–99)
Glucose-Capillary: 91 mg/dL (ref 70–99)
Glucose-Capillary: 95 mg/dL (ref 70–99)
Glucose-Capillary: 111 mg/dL — ABNORMAL HIGH (ref 70–99)

## 2023-01-11 LAB — HEMOGLOBIN A1C
Hgb A1c MFr Bld: 5.3 % (ref 4.8–5.6)
Mean Plasma Glucose: 105 mg/dL

## 2023-01-11 SURGERY — ESOPHAGOGASTRODUODENOSCOPY (EGD) WITH PROPOFOL
Anesthesia: General

## 2023-01-11 MED ORDER — STERILE WATER FOR IRRIGATION IR SOLN
Status: DC | PRN
  Administered 2023-01-11: 60 mL

## 2023-01-11 MED ORDER — LACTATED RINGERS IV SOLN: INTRAVENOUS | Status: DC

## 2023-01-11 MED ORDER — PHENYLEPHRINE 80 MCG/ML (10ML) SYRINGE FOR IV PUSH (FOR BLOOD PRESSURE SUPPORT)
PREFILLED_SYRINGE | INTRAVENOUS | Status: DC | PRN
  Administered 2023-01-11: 80 ug via INTRAVENOUS

## 2023-01-11 MED ORDER — PROPOFOL 10 MG/ML IV BOLUS
INTRAVENOUS | Status: DC | PRN
  Administered 2023-01-11: 50 mg via INTRAVENOUS
  Administered 2023-01-11 (×2): 20 mg via INTRAVENOUS

## 2023-01-11 MED ORDER — PROPOFOL 500 MG/50ML IV EMUL
INTRAVENOUS | Status: DC | PRN
  Administered 2023-01-11: 100 ug/kg/min via INTRAVENOUS

## 2023-01-11 MED ORDER — LIDOCAINE HCL (CARDIAC) PF 100 MG/5ML IV SOSY
PREFILLED_SYRINGE | INTRAVENOUS | Status: DC | PRN
  Administered 2023-01-11: 60 mg via INTRAVENOUS

## 2023-01-11 NOTE — Interval H&P Note (Signed)
History and Physical Interval Note:  01/11/2023 3:01 PM  Joe Wilkinson  has presented today for surgery, with the diagnosis of anemia, heme positive stool.  The various methods of treatment have been discussed with the patient and family. After consideration of risks, benefits and other options for treatment, the patient has consented to  Procedure(s): ESOPHAGOGASTRODUODENOSCOPY (EGD) WITH PROPOFOL (N/A) COLONOSCOPY WITH PROPOFOL (N/A) as a surgical intervention.  The patient's history has been reviewed, patient examined, no change in status, stable for surgery.  I have reviewed the patient's chart and labs.  Questions were answered to the patient's satisfaction.     Nga Rabon  No change.  Hemoglobin 8.2 this morning.  No transfusion this hospitalization.  Denies dysphagia.  Agree with need for EGD and colonoscopy.  Patient both an EGD and colonoscopy today per plan. The risks, benefits, limitations, imponderables and alternatives regarding both EGD and colonoscopy have been reviewed with the patient. Questions have been answered. All parties agreeable.

## 2023-01-11 NOTE — Progress Notes (Signed)
PROGRESS NOTE  Joe Wilkinson, is a 62 y.o. male, DOB - [birth year], ZOX:[id number]  Admit date - 01/09/2023   Admitting Physician Hara Milholland Mariea Clonts, MD  Outpatient Primary MD for the patient is Sharee Holster, NP  LOS - 1  Chief Complaint  Patient presents with   Abnormal Lab        Brief Narrative:  62 y.o. 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 admitted on 01/09/2023 with concerns about acute GI bleed and drop in hemoglobin    -Assessment and Plan: 1)Acute on chronic anemia---POA -Due to presumed acute GI bleed in the setting of chronic Eliquis use -Hgb is down to 7.7 baseline usually between 10 and 11 GI evaluation appreciated -Continue IV Protonix 40 daily -Per GI team okay to restart Eliquis on 01/12/2023 -- S/p EGD and colonoscopy on 01/11/2023 -EGD without acute findings -Colonoscopy with polypectomy noted otherwise no acute abnormalities--pathology pending  2) history of prior stroke with vascular dementia (HCC) -Per GI team okay to restart Eliquis on 01/12/2023 -Restart atorvastatin  3)Seizure (HCC) Continue gabapentin and Keppra Keppra,   -Lorazepam as needed  4)DM2-  Prior A1c 5.3 reflecting excellent diabetic control PTA -Hold metformin, semaglutide---  Use Novolog/Humalog Sliding scale insulin with Accu-Cheks/Fingersticks as ordered   5)AF (paroxysmal atrial fibrillation) (HCC) No AV nodal blocking agent -Per GI team okay to restart Eliquis on 01/12/2023  6)Leukocytosis--??  Reactive -No fevers or other evidence of infection at this time -History of chronic elevated WBC  Status is: Inpatient   Disposition: The patient is from: SNF              Anticipated d/c is to: SNF              Anticipated d/c date is: 1 day              Patient currently is not medically stable to d/c. Barriers: Not Clinically Stable-   Code Status :  -   Code Status: Full Code   Family Communication:    (patient is alert, awake and coherent)  -sister [name] is contact  DVT Prophylaxis  :   - SCDs  SCDs Start: 01/09/23 1919   Lab Results  Component Value Date   PLT 272 01/11/2023   Inpatient Medications  Scheduled Meds:  atorvastatin  40 mg Oral Daily   gabapentin  100 mg Oral QHS   insulin aspart  0-5 Units Subcutaneous QHS   insulin aspart  0-9 Units Subcutaneous TID WC   levETIRAcetam  500 mg Oral Q24H   pantoprazole (PROTONIX) IV  40 mg Intravenous Q24H   QUEtiapine  100 mg Oral QHS   Continuous Infusions:   PRN Meds:.acetaminophen **OR** acetaminophen, guaiFENesin-dextromethorphan, LORazepam, ondansetron **OR** ondansetron (ZOFRAN) IV, polyethylene glycol   Anti-infectives (From admission, onward)    None       Subjective: Joe Wilkinson today has no fevers, no emesis,  No chest pain,   = Tolerated EGD and colonoscopy well  Objective: Vitals:   01/11/23 1445 01/11/23 1545 01/11/23 1600 01/11/23 1627  BP: 122/73 (!) 92/50 (!) 94/47 (!) 153/125  Pulse: 94 98 98 72  Resp: 18 18 19    Temp: 98 F (36.7 C) 97.6 F (36.4 C)  98 F (36.7 C)  TempSrc:    Oral  SpO2: 99% 100% 97% 96%  Weight:      Height:  Intake/Output Summary (Last 24 hours) at 01/11/2023 1832 Last data filed at 01/11/2023 1816 Gross per 24 hour  Intake 881.83 ml  Output 1500 ml  Net -618.17 ml   Filed Weights   01/09/23 1201  Weight: 80 kg   Physical Exam Gen:- Awake Alert, no acute distress HEENT:- [place].AT, No sclera icterus Neck-Supple Neck,No JVD,.  Lungs-  CTAB , fair symmetrical air movement CV- S1, S2 normal, regular  Abd-  +ve B.Sounds, Abd Soft, No tenderness,    Extremity/Skin:- No edema, pedal pulses present  Psych-affect is appropriate, oriented x3 Neuro-left-sided hemiplegia with left upper extremity contracture, no additional New focal deficits, no tremors  Data Reviewed: I have personally reviewed  following labs and imaging studies  CBC: Recent Labs  Lab 01/09/23 0800 01/09/23 1046 01/09/23 1508 01/10/23 0422 01/10/23 1038 01/10/23 1518 01/11/23 0413  WBC 12.7*  --  12.9* 13.0*  --   --  11.1*  NEUTROABS  --   --  7.3  --   --   --   --   HGB 7.6*   < > 8.3* 7.7* 7.7* 8.4* 8.2*  HCT 28.5*   < > 31.5* 28.6* 29.4* 31.4* 29.7*  MCV 75.4*  --  75.0* 74.5*  --   --  72.8*  PLT 263  --  264 268  --   --  272   < > = values in this interval not displayed.   Basic Metabolic Panel: Recent Labs  Lab 01/09/23 0800 01/09/23 1508  NA 141 138  K 3.5 3.9  CL 107 105  CO2 27 26  GLUCOSE 126* 112*  BUN 13 12  CREATININE 0.69 0.77  CALCIUM 9.0 9.1   GFR: Estimated Creatinine Clearance: 96.4 mL/min (by C-G formula based on SCr of 0.77 mg/dL). Liver Function Tests: Recent Labs  Lab 01/09/23 0800  AST 8*  ALT 11  ALKPHOS 46  BILITOT 0.3  PROT 6.3*  ALBUMIN 2.7*   Radiology Studies: No results found.  Scheduled Meds:  atorvastatin  40 mg Oral Daily   gabapentin  100 mg Oral QHS   insulin aspart  0-5 Units Subcutaneous QHS   insulin aspart  0-9 Units Subcutaneous TID WC   levETIRAcetam  500 mg Oral Q24H   pantoprazole (PROTONIX) IV  40 mg Intravenous Q24H   QUEtiapine  100 mg Oral QHS   Continuous Infusions:   LOS: 1 day   Shon Hale M.D on 01/11/2023 at 6:32 PM  Go to [email or web address] - for contact info  [facility] - Office  [phone]  If 7PM-7AM, please contact night-coverage [email or web address] 01/11/2023, 6:32 PM

## 2023-01-11 NOTE — Progress Notes (Signed)
Completed 3rd tap water enema. BM is still not clear. Short stay is aware.

## 2023-01-11 NOTE — Transfer of Care (Signed)
Immediate Anesthesia Transfer of Care Note  Patient: Joe Wilkinson  Procedure(s) Performed: ESOPHAGOGASTRODUODENOSCOPY (EGD) WITH PROPOFOL COLONOSCOPY WITH PROPOFOL POLYPECTOMY INTESTINAL  Patient Location: PACU  Anesthesia Type:General  Level of Consciousness: drowsy  Airway & Oxygen Therapy: Patient Spontanous Breathing  Post-op Assessment: Report given to RN and Post -op Vital signs reviewed and stable  Post vital signs: Reviewed and stable  Last Vitals:  Vitals Value Taken Time  BP 92/50   Temp 97.6   Pulse 97   Resp 18   SpO2 100%     Last Pain:  Vitals:   01/11/23 1507  TempSrc:   PainSc: 0-No pain         Complications: No notable events documented.

## 2023-01-11 NOTE — Op Note (Signed)
[facility] Patient Name: Joe Wilkinson Procedure Date: 01/11/2023 2:54 PM MRN: [id number] Date of Birth: 10-19-1960 Attending MD: Gennette Pac , MD, [id number] CSN: [id number] Age: 62 Admit Type: Outpatient Procedure:                Colonoscopy Indications:              Heme positive stool Providers:                Gennette Pac, MD, Sheran Fava, Dyann Ruddle Referring MD:              Medicines:                Propofol per Anesthesia Complications:            No immediate complications. Estimated Blood Loss:     Estimated blood loss was minimal. Procedure:                Pre-Anesthesia Assessment:                           - Prior to the procedure, a History and Physical                            was performed, and patient medications and                            allergies were reviewed. The patient's tolerance of                            previous anesthesia was also reviewed. The risks                            and benefits of the procedure and the sedation                            options and risks were discussed with the patient.                            All questions were answered, and informed consent                            was obtained. Prior Anticoagulants: The patient                            last took Eliquis (apixaban) 2 days prior to the                            procedure. ASA Grade Assessment: III - A patient                            with severe systemic disease. After reviewing the  risks and benefits, the patient was deemed in                            satisfactory condition to undergo the procedure.                           After obtaining informed consent, the colonoscope                            was passed under direct vision. Throughout the                            procedure, the patient's blood pressure, pulse, and                            oxygen saturations  were monitored continuously. The                            [id number]) scope was introduced through                            the anus and advanced to the the cecum, identified                            by appendiceal orifice and ileocecal valve. The                            colonoscopy was performed without difficulty. The                            patient tolerated the procedure well. The quality                            of the bowel preparation was adequate. The entire                            colon was well visualized. The colonoscopy was                            performed without difficulty. The ileocecal valve,                            appendiceal orifice, and rectum were photographed. Scope In: 3:22:44 PM Scope Out: 3:39:45 PM Scope Withdrawal Time: 0 hours 9 minutes 58 seconds  Total Procedure Duration: 0 hours 17 minutes 1 second  Findings:      The perianal and digital rectal examinations were normal. Elongated and       redundant colon. External abdominal pressure required to reach the cecum.      Two sessile polyps were found in the hepatic flexure and cecum. The       polyps were 3 to 5 mm in size. These polyps were removed with a cold       snare. Resection and retrieval were complete. Estimated blood loss was       minimal.  Scattered medium-mouthed diverticula were found in the sigmoid colon and       descending colon.      The exam was otherwise without abnormality on direct and retroflexion       views. Impression:               - Two 3 to 5 mm polyps at the hepatic flexure and                            in the cecum, removed with a cold snare. Resected                            and retrieved.                           - Diverticulosis in the sigmoid colon and in the                            descending colon. Redundant and elongated colon.                           - The examination was otherwise normal on direct                             and retroflexion views. Moderate Sedation:      Moderate (conscious) sedation was personally administered by an       anesthesia professional. The following parameters were monitored: oxygen       saturation, heart rate, blood pressure, respiratory rate, EKG, adequacy       of pulmonary ventilation, and response to care. Recommendation:           - Diabetic (ADA) diet.                           - Continue present medications. May resume Eliquis                            tomorrow.                           - Repeat colonoscopy date to be determined after                            pending pathology results are reviewed for                            surveillance.                           - Return patient to hospital ward for ongoing care.                           -See EGD report. I doubt a significant GI bleed.                            Would trend H&H closely as an outpatient. May or  may not need further evaluation of small bowel. I                            will follow-up on pathology. At patient request, I                            attempted to call patient's Sister Joe Wilkinson at [phone] told it was a wrong                            number. Procedure Code(s):        --- Professional ---                           [phone], Colonoscopy, flexible; with removal of                            tumor(s), polyp(s), or other lesion(s) by snare                            technique Diagnosis Code(s):        --- Professional ---                           D12.3, Benign neoplasm of transverse colon (hepatic                            flexure or splenic flexure)                           D12.0, Benign neoplasm of cecum                           R19.5, Other fecal abnormalities                           K57.30, Diverticulosis of large intestine without                            perforation or abscess without bleeding CPT  copyright 2022 American Medical Association. All rights reserved. The codes documented in this report are preliminary and upon coder review may  be revised to meet current compliance requirements. [name] Marisabel Macpherson, MD Gennette Pac, MD 01/11/2023 4:38:01 PM This report has been signed electronically. Number of Addenda: 0

## 2023-01-11 NOTE — Op Note (Signed)
[facility] Patient Name: Joe Wilkinson Procedure Date: 01/11/2023 2:55 PM MRN: [id number] Date of Birth: 1960/11/11 Attending MD: Gennette Pac , MD, [id number] CSN: [id number] Age: 62 Admit Type: Outpatient Procedure:                Upper GI endoscopy Indications:              Heme positive stool Providers:                Gennette Pac, MD, Sheran Fava, Dyann Ruddle Referring MD:              Medicines:                Propofol per Anesthesia Complications:            No immediate complications. Estimated Blood Loss:     Estimated blood loss: none. Procedure:                Pre-Anesthesia Assessment:                           - Prior to the procedure, a History and Physical                            was performed, and patient medications and                            allergies were reviewed. The patient's tolerance of                            previous anesthesia was also reviewed. The risks                            and benefits of the procedure and the sedation                            options and risks were discussed with the patient.                            All questions were answered, and informed consent                            was obtained. Prior Anticoagulants: The patient                            last took Eliquis (apixaban) 2 days prior to the                            procedure. ASA Grade Assessment: II - A patient                            with mild systemic disease. After reviewing the  risks and benefits, the patient was deemed in                            satisfactory condition to undergo the procedure.                           After obtaining informed consent, the endoscope was                            passed under direct vision. Throughout the                            procedure, the patient's blood pressure, pulse, and                            oxygen saturations were  monitored continuously. The                            [id number] ([id number]) scope was introduced through the                            mouth, and advanced to the second part of duodenum. Scope In: 3:13:35 PM Scope Out: 3:17:30 PM Total Procedure Duration: 0 hours 3 minutes 55 seconds  Findings:      The examined esophagus was normal.      The entire examined stomach was normal aside from a small dimple on the       anterior gastric wall consistent with the prior PEG site. Pylorus patent.      The duodenal bulb and second portion of the duodenum were normal. Impression:               - Normal esophagus.                           - Normal stomach. PEG scar apparent.                           - Normal duodenal bulb and second portion of the                            duodenum.                           - No specimens collected. Moderate Sedation:      Moderate (conscious) sedation was personally administered by an       anesthesia professional. The following parameters were monitored: oxygen       saturation, heart rate, blood pressure, respiratory rate, EKG, adequacy       of pulmonary ventilation, and response to care. Recommendation:           - Return patient to hospital ward for observation.                           - Diabetic (ADA) diet. See colonoscopy report. Procedure Code(s):        --- Professional ---  40981, Esophagogastroduodenoscopy, flexible,                            transoral; diagnostic, including collection of                            specimen(s) by brushing or washing, when performed                            (separate procedure) Diagnosis Code(s):        --- Professional ---                           R19.5, Other fecal abnormalities CPT copyright 2022 American Medical Association. All rights reserved. The codes documented in this report are preliminary and upon coder review may  be revised to meet current compliance requirements. Joe Wilkinson. Joe Shibley, MD Gennette Pac, MD 01/11/2023 3:48:57 PM This report has been signed electronically. Number of Addenda: 0

## 2023-01-11 NOTE — Progress Notes (Signed)
Second enema administered. BM is still caramel color and is liquid.

## 2023-01-11 NOTE — Progress Notes (Deleted)
Report given to nurse sylvia at [facility].  

## 2023-01-11 NOTE — Anesthesia Preprocedure Evaluation (Signed)
Anesthesia Evaluation  Patient identified by MRN, date of birth, ID band Patient awake and Patient confused    Reviewed: Allergy & Precautions, H&P , NPO status , Patient's Chart, lab work & pertinent test results, reviewed documented beta blocker date and time   Airway Mallampati: II  TM Distance: >3 FB Neck ROM: full    Dental no notable dental hx.    Pulmonary former smoker   Pulmonary exam normal breath sounds clear to auscultation       Cardiovascular Exercise Tolerance: Good hypertension, + dysrhythmias Atrial Fibrillation  Rhythm:irregular Rate:Normal     Neuro/Psych Seizures -,  PSYCHIATRIC DISORDERS     Dementia  Neuromuscular disease CVA negative neurological ROS  negative psych ROS   GI/Hepatic negative GI ROS, Neg liver ROS,GERD  ,,  Endo/Other  negative endocrine ROSdiabetes, Type 2    Renal/GU negative Renal ROS  negative genitourinary   Musculoskeletal   Abdominal   Peds  Hematology negative hematology ROS (+) Blood dyscrasia, anemia   Anesthesia Other Findings   Reproductive/Obstetrics negative OB ROS                             Anesthesia Physical Anesthesia Plan  ASA: 4 and emergent  Anesthesia Plan: General   Post-op Pain Management:    Induction:   PONV Risk Score and Plan: Propofol infusion  Airway Management Planned:   Additional Equipment:   Intra-op Plan:   Post-operative Plan:   Informed Consent: I have reviewed the patients History and Physical, chart, labs and discussed the procedure including the risks, benefits and alternatives for the proposed anesthesia with the patient or authorized representative who has indicated his/her understanding and acceptance.     Dental Advisory Given  Plan Discussed with: CRNA  Anesthesia Plan Comments:        Anesthesia Quick Evaluation

## 2023-01-11 NOTE — Progress Notes (Signed)
   01/11/23 1420  Vitals  Temp 98.7 F (37.1 C)  Temp Source Oral  BP (!) 99/54  BP Location Right Arm  BP Method Automatic  Patient Position (if appropriate) Lying  Pulse Rate 87  Pulse Rate Source Dinamap  Resp 19  MEWS COLOR  MEWS Score Color Green  Oxygen Therapy  SpO2 100 %  O2 Device Room Air  MEWS Score  MEWS Temp 0  MEWS Systolic 1  MEWS Pulse 0  MEWS RR 0  MEWS LOC 0  MEWS Score 1   MD Emokpae notified.

## 2023-01-12 ENCOUNTER — Encounter (HOSPITAL_COMMUNITY): Payer: Self-pay | Admitting: Internal Medicine

## 2023-01-12 DIAGNOSIS — D649 Anemia, unspecified: Secondary | ICD-10-CM | POA: Diagnosis not present

## 2023-01-12 LAB — SARS CORONAVIRUS 2 BY RT PCR: SARS Coronavirus 2 by RT PCR: NEGATIVE

## 2023-01-12 LAB — CBC
HCT: 31.5 % — ABNORMAL LOW (ref 39.0–52.0)
Hemoglobin: 8.4 g/dL — ABNORMAL LOW (ref 13.0–17.0)
MCH: 19.6 pg — ABNORMAL LOW (ref 26.0–34.0)
MCHC: 26.7 g/dL — ABNORMAL LOW (ref 30.0–36.0)
MCV: 73.4 fL — ABNORMAL LOW (ref 80.0–100.0)
Platelets: 291 10*3/uL (ref 150–400)
RBC: 4.29 MIL/uL (ref 4.22–5.81)
RDW: 21 % — ABNORMAL HIGH (ref 11.5–15.5)
WBC: 12.9 10*3/uL — ABNORMAL HIGH (ref 4.0–10.5)
nRBC: 0.2 % (ref 0.0–0.2)

## 2023-01-12 LAB — GLUCOSE, CAPILLARY
Glucose-Capillary: 118 mg/dL — ABNORMAL HIGH (ref 70–99)
Glucose-Capillary: 175 mg/dL — ABNORMAL HIGH (ref 70–99)

## 2023-01-12 MED ORDER — OMEPRAZOLE 40 MG PO CPDR: 40.0000 mg | DELAYED_RELEASE_CAPSULE | Freq: Every day | ORAL | 3 refills | Status: DC

## 2023-01-12 MED ORDER — LORAZEPAM 2 MG/ML PO CONC: 1.0000 mg | Freq: Three times a day (TID) | ORAL | 0 refills | Status: DC | PRN

## 2023-01-12 NOTE — Progress Notes (Signed)
Discussed with attending. Patient may be discharged today. Stable from GI standpoint.  EGD/colonoscopy with colonic diverticulosis and 2 small colon polyps removed.  Hemoglobin stable.  We will follow-up on pathology.  Recommend trending hemoglobin as an outpatient.  May or may not need small bowel capsule endoscopy in the future.  Further recommendations to follow pending pathology.   Leanna Battles. Dixon Boos [facility] [phone] 6/6/202410:07 AM

## 2023-01-12 NOTE — Discharge Summary (Signed)
Joe Wilkinson, is a 62 y.o. male  DOB [birth year]  MRN [id number].  Admission date:  01/09/2023  Admitting Physician  Marsa Matteo Mariea Clonts, MD  Discharge Date:  01/12/2023   Primary MD  Sharee Holster, NP  Recommendations for primary care physician for things to follow:  1)Avoid ibuprofen/Advil/Aleve/Motrin/Goody Powders/Naproxen/BC powders/Meloxicam/Diclofenac/Indomethacin and other Nonsteroidal anti-inflammatory medications as these will make you more likely to bleed and can cause stomach ulcers, can also cause Kidney problems.   2)Repeat CBC Blood on Monday 01/16/23  Admission Diagnosis  Gastrointestinal hemorrhage, unspecified gastrointestinal hemorrhage type [K92.2] Anemia, unspecified type [D64.9] Acute anemia [D64.9] Acute GI bleeding [K92.2]   Discharge Diagnosis  Gastrointestinal hemorrhage, unspecified gastrointestinal hemorrhage type [K92.2] Anemia, unspecified type [D64.9] Acute anemia [D64.9] Acute GI bleeding [K92.2]    Principal Problem:   Acute anemia Active Problems:   AF (paroxysmal atrial fibrillation) (HCC)   Hemiparesis affecting left side as late effect of cerebrovascular accident (CVA) (HCC)   Dyslipidemia associated with type 2 diabetes mellitus (HCC)   Hypertension associated with type 2 diabetes mellitus (HCC)   Seizure (HCC)   Vascular dementia (HCC)   Chronic anticoagulation   Heme positive stool   Acute GI bleeding      Past Medical History:  Diagnosis Date   A-fib (HCC)    DM (diabetes mellitus) (HCC)    History of seizures    Hypertension    Stroke [facility])     Past Surgical History:  Procedure Laterality Date   CHOLECYSTECTOMY     CRANIECTOMY Right 04/01/2017   Procedure: RIGHT DECOMPRESSIVE CRANIECTOMY;  Surgeon: Ditty, Loura Halt, MD;  Location: [facility] OR;  Service: Neurosurgery;  Laterality: Right;   ESOPHAGOGASTRODUODENOSCOPY N/A 04/13/2017   Procedure:  ESOPHAGOGASTRODUODENOSCOPY (EGD);  Surgeon: Violeta Gelinas, MD;  Location: [facility] ENDOSCOPY;  Service: General;  Laterality: N/A;  bedside   INCISION AND DRAINAGE PERIRECTAL ABSCESS N/A 09/28/2017   Procedure: IRRIGATION AND DEBRIDEMENT PERIANAL ABSCESS;  Surgeon: Lucretia Roers, MD;  Location: [facility] ORS;  Service: General;  Laterality: N/A;   PEG PLACEMENT N/A 04/13/2017   Procedure: PERCUTANEOUS ENDOSCOPIC GASTROSTOMY (PEG) PLACEMENT;  Surgeon: Violeta Gelinas, MD;  Location: [facility] ENDOSCOPY;  Service: General;  Laterality: N/A;       HPI  from the history and physical done on the day of admission:   Chief Complaint: Low Hgb   HPI: Joe Wilkinson is a 62 y.o. male with medical history significant for  DM, Atria fib, seizures, HTN, stroke, pseudobulbar affect.  Patient presented to ED from nursing home with reports of low hemoglobin.  Patient denies black stools, no blood in stools, no vomiting of blood.  No abdominal pain.  Denies weakness or difficulty breathing at rest or with exertion.  No chest pain.  He has no complaints.  Routine blood work was drawn today.  Hemoglobin was 7.6, and was confirmed with repeat H&H - hgb was 7.7   ED Course: Stable vitals.  Repeat hemoglobin in the ED is 8.3.  GI consulted, anticipate colonoscopy/EGD on Wednesday.  Review of Systems: As per HPI all other systems reviewed and negative.    Hospital Course:   Brief Narrative:  62 y.o. 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 admitted on 01/09/2023 with concerns about acute GI bleed and drop in hemoglobin     -Assessment and Plan: 1)Acute on chronic anemia---POA -Due to presumed acute GI bleed in the setting of chronic Eliquis use -Hgb is down to 7.7 baseline usually between 10 and 11 GI evaluation appreciated -Continue IV Protonix 40 daily -Per GI team okay to restart Eliquis on  01/12/2023 -- S/p EGD and colonoscopy on 01/11/2023 -EGD without acute findings -Colonoscopy with polypectomy noted otherwise no acute abnormalities--pathology pending -- Hgb stable above 8 -Repeat CBC on June 10  2) history of prior stroke with vascular dementia (HCC) -Per GI team okay to restart Eliquis on 01/12/2023 -c/n atorvastatin   3)Seizure (HCC) Continue gabapentin and Keppra  -Lorazepam as needed   4)DM2-  Prior A1c 5.3 reflecting excellent diabetic control PTA Okay to restart metformin, semaglutide---  -May consider liberalizing diet or stopping semaglutide to reduce risk of symptomatic hypoglycemia   5)AF (paroxysmal atrial fibrillation) (HCC) No AV nodal blocking agent -Per GI team okay to restart Eliquis on 01/12/2023   6)Leukocytosis--??  Reactive -No fevers or other evidence of infection at this time -History of chronic elevated WBC   Disposition: The patient is from: SNF              Anticipated d/c is to: SNF  Discharge Condition: stable  Follow UP   Contact information for after-discharge care     Destination     [facility] Preferred SNF .   Service: Skilled Nursing Contact information: [address] [place] [address] [phone]                     Consults obtained - Gi  Diet and Activity recommendation:  As advised  Discharge Instructions    Discharge Instructions     Call MD for:  difficulty breathing, headache or visual disturbances   Complete by: As directed    Call MD for:  persistant dizziness or light-headedness   Complete by: As directed    Call MD for:  persistant nausea and vomiting   Complete by: As directed    Call MD for:  temperature >100.4   Complete by: As directed    Diet - low sodium heart healthy   Complete by: As directed    Diet Carb Modified   Complete by: As directed    Discharge instructions   Complete by: As directed    1)Avoid ibuprofen/Advil/Aleve/Motrin/Goody  Powders/Naproxen/BC powders/Meloxicam/Diclofenac/Indomethacin and other Nonsteroidal anti-inflammatory medications as these will make you more likely to bleed and can cause stomach ulcers, can also cause Kidney problems.   2)Repeat CBC Blood on Monday 01/16/23   Increase activity slowly   Complete by: As directed        Discharge Medications     Allergies as of 01/12/2023       Reactions   Cheese    Penicillins Hives   Has patient had a PCN reaction causing immediate rash, facial/tongue/throat swelling, SOB or lightheadedness with hypotension: Unknown Has patient had a PCN reaction causing severe rash involving mucus membranes or skin necrosis: Unknown Has patient had a PCN reaction that required hospitalization: No Has patient had a PCN  reaction occurring within the last 10 years: No If all of the above answers are "NO", then may proceed with Cephalosporin use.        Medication List     TAKE these medications    acetaminophen 325 MG tablet Commonly known as: TYLENOL Take 650 mg by mouth as directed. Every 4-6 hours as needed, not to exceed 3 grams in 24 hours   apixaban 5 MG Tabs tablet Commonly known as: ELIQUIS Take 5 mg by mouth 2 (two) times daily.   atorvastatin 20 MG tablet Commonly known as: LIPITOR Take 20 mg by mouth at bedtime.   Biofreeze 4 % Gel Generic drug: Menthol (Topical Analgesic) Apply 1 application  topically at bedtime as needed. Apply to left shoulder   Cholecalciferol 1.25 MG (50000 UT) capsule Take 50,000 Units by mouth once a week. For Low Vitamin D Level on Monday   docusate 50 MG/5ML liquid Commonly known as: COLACE Take 100 mg by mouth at bedtime.   Ensure Max Protein Liqd Take 11 oz by mouth daily. 9 am   gabapentin 100 MG capsule Commonly known as: NEURONTIN Take 100 mg by mouth at bedtime.   levETIRAcetam 500 MG 24 hr tablet Commonly known as: KEPPRA XR Take 500 mg by mouth daily. At bedtime 8 pm   loratadine 10 MG  tablet Commonly known as: CLARITIN Take 10 mg by mouth daily.   LORazepam 2 MG/ML concentrated solution Commonly known as: ATIVAN Take 0.5 mLs (1 mg total) by mouth every 8 (eight) hours as needed for seizure or anxiety (injection). As needed for seizures lasting longer than 2 minutes. What changed:  how much to take when to take this reasons to take this additional instructions   metFORMIN 500 MG 24 hr tablet Commonly known as: GLUCOPHAGE-XR Take 500 mg by mouth daily with breakfast.   NON FORMULARY Diet:Regular   Nuedexta 20-10 MG capsule Generic drug: Dextromethorphan-quiNIDine Take 1 capsule by mouth 2 (two) times daily.   omeprazole 40 MG capsule Commonly known as: PRILOSEC Take 1 capsule (40 mg total) by mouth daily.   ondansetron 8 MG disintegrating tablet Commonly known as: ZOFRAN-ODT Take 8 mg by mouth every 6 (six) hours as needed for nausea or vomiting.   polyethylene glycol 17 g packet Commonly known as: MIRALAX / GLYCOLAX Take 17 g by mouth daily. 9 pm   QUEtiapine 100 MG tablet Commonly known as: SEROQUEL Take 100 mg by mouth at bedtime.   Rybelsus 7 MG Tabs Generic drug: Semaglutide Take 7 mg by mouth daily at 2 PM.       Major procedures and Radiology Reports - PLEASE review detailed and final reports for all details, in brief -   Today   Subjective    Joe Wilkinson today has no new complaints  -Tolerating oral intake well No fever  Or chills   No Nausea, Vomiting or Diarrhea  Patient has been seen and examined prior to discharge   Objective   Blood pressure (!) 115/49, pulse 81, temperature 97.9 F (36.6 C), temperature source Oral, resp. rate 18, height 5\' 6"  (1.676 m), weight 80 kg, SpO2 95 %.   Intake/Output Summary (Last 24 hours) at 01/12/2023 1105 Last data filed at 01/12/2023 0300 Gross per 24 hour  Intake 641.83 ml  Output 1350 ml  Net -708.17 ml   Exam Gen:- Awake Alert, no acute distress HEENT:- [place].AT, No sclera  icterus Neck-Supple Neck,No JVD,.  Lungs-  CTAB , fair symmetrical air movement  CV- S1, S2 normal, regular  Abd-  +ve B.Sounds, Abd Soft, No tenderness,    Extremity/Skin:- No edema, pedal pulses present  Psych-affect is appropriate, oriented x3 Neuro-left-sided hemiplegia with left upper extremity contracture, no additional New focal deficits, no tremors   Data Review   CBC w Diff:  Lab Results  Component Value Date   WBC 12.9 (H) 01/12/2023   HGB 8.4 (L) 01/12/2023   HCT 31.5 (L) 01/12/2023   PLT 291 01/12/2023   LYMPHOPCT 28 01/09/2023   MONOPCT 9 01/09/2023   EOSPCT 4 01/09/2023   BASOPCT 2 01/09/2023   CMP:  Lab Results  Component Value Date   NA 138 01/09/2023   K 3.9 01/09/2023   CL 105 01/09/2023   CO2 26 01/09/2023   BUN 12 01/09/2023   CREATININE 0.77 01/09/2023   PROT 6.3 (L) 01/09/2023   ALBUMIN 2.7 (L) 01/09/2023   BILITOT 0.3 01/09/2023   ALKPHOS 46 01/09/2023   AST 8 (L) 01/09/2023   ALT 11 01/09/2023   Total Discharge time is about 33 minutes  Shon Hale M.D on 01/12/2023 at 11:05 AM  Go to [email or web address] -  for contact info  [facility] - Office  [phone]

## 2023-01-12 NOTE — TOC Transition Note (Signed)
Transition of Care [facility]) - CM/SW Discharge Note   Patient Details  Name: Joe Wilkinson MRN: [id number] Date of Birth: 1960-12-01  Transition of Care [facility]) CM/SW Contact:  Annice Needy, LCSW Phone Number: 01/12/2023, 12:20 PM   Clinical Narrative:    D/c clinicals sent to facility. Nurse to call report to [facility]. TOC signing off.    Final next level of care: Skilled Nursing Facility Barriers to Discharge: No Barriers Identified   Patient Goals and CMS Choice      Discharge Placement                  Patient to be transferred to facility by: facility staff Name of family member notified: sister, Fredric Mare Patient and family notified of of transfer: 01/12/23  Discharge Plan and Services Additional resources added to the After Visit Summary for                                       Social Determinants of Health (SDOH) Interventions SDOH Screenings   Food Insecurity: No Food Insecurity (01/09/2023)  Housing: Low Risk  (01/09/2023)  Transportation Needs: No Transportation Needs (01/09/2023)  Utilities: Not At Risk (01/09/2023)  Depression (PHQ2-9): Low Risk  (12/14/2022)  Tobacco Use: Medium Risk (01/11/2023)     Readmission Risk Interventions     No data to display

## 2023-01-12 NOTE — Progress Notes (Signed)
DBP low, other vitals stable. No c/o pain noted.

## 2023-01-12 NOTE — Discharge Instructions (Signed)
1)Avoid ibuprofen/Advil/Aleve/Motrin/Goody Powders/Naproxen/BC powders/Meloxicam/Diclofenac/Indomethacin and other Nonsteroidal anti-inflammatory medications as these will make you more likely to bleed and can cause stomach ulcers, can also cause Kidney problems.   2)Repeat CBC Blood on Monday 01/16/23

## 2023-01-13 ENCOUNTER — Non-Acute Institutional Stay (SKILLED_NURSING_FACILITY): Payer: Medicaid Other | Admitting: Adult Health

## 2023-01-13 ENCOUNTER — Encounter: Payer: Self-pay | Admitting: Adult Health

## 2023-01-13 DIAGNOSIS — E042 Nontoxic multinodular goiter: Secondary | ICD-10-CM

## 2023-01-13 DIAGNOSIS — D649 Anemia, unspecified: Secondary | ICD-10-CM

## 2023-01-13 DIAGNOSIS — E1169 Type 2 diabetes mellitus with other specified complication: Secondary | ICD-10-CM

## 2023-01-13 DIAGNOSIS — R569 Unspecified convulsions: Secondary | ICD-10-CM

## 2023-01-13 DIAGNOSIS — I69354 Hemiplegia and hemiparesis following cerebral infarction affecting left non-dominant side: Secondary | ICD-10-CM

## 2023-01-13 DIAGNOSIS — K5909 Other constipation: Secondary | ICD-10-CM

## 2023-01-13 DIAGNOSIS — K219 Gastro-esophageal reflux disease without esophagitis: Secondary | ICD-10-CM

## 2023-01-13 DIAGNOSIS — Z8673 Personal history of transient ischemic attack (TIA), and cerebral infarction without residual deficits: Secondary | ICD-10-CM

## 2023-01-13 DIAGNOSIS — E1142 Type 2 diabetes mellitus with diabetic polyneuropathy: Secondary | ICD-10-CM

## 2023-01-13 DIAGNOSIS — I480 Paroxysmal atrial fibrillation: Secondary | ICD-10-CM

## 2023-01-13 DIAGNOSIS — E43 Unspecified severe protein-calorie malnutrition: Secondary | ICD-10-CM

## 2023-01-13 DIAGNOSIS — E1159 Type 2 diabetes mellitus with other circulatory complications: Secondary | ICD-10-CM | POA: Diagnosis not present

## 2023-01-13 DIAGNOSIS — F22 Delusional disorders: Secondary | ICD-10-CM

## 2023-01-13 DIAGNOSIS — F01B2 Vascular dementia, moderate, with psychotic disturbance: Secondary | ICD-10-CM

## 2023-01-13 DIAGNOSIS — E785 Hyperlipidemia, unspecified: Secondary | ICD-10-CM

## 2023-01-13 DIAGNOSIS — I152 Hypertension secondary to endocrine disorders: Secondary | ICD-10-CM

## 2023-01-13 DIAGNOSIS — E1149 Type 2 diabetes mellitus with other diabetic neurological complication: Secondary | ICD-10-CM

## 2023-01-13 LAB — SURGICAL PATHOLOGY

## 2023-01-13 NOTE — Progress Notes (Unsigned)
Location:  [facility] Room Number: 113D Place of Service:  SNF (31)   CODE STATUS: DNR  Allergies  Allergen Reactions   Cheese    Penicillins Hives    Has patient had a PCN reaction causing immediate rash, facial/tongue/throat swelling, SOB or lightheadedness with hypotension: Unknown Has patient had a PCN reaction causing severe rash involving mucus membranes or skin necrosis: Unknown Has patient had a PCN reaction that required hospitalization: No Has patient had a PCN reaction occurring within the last 10 years: No If all of the above answers are "NO", then may proceed with Cephalosporin use.     Chief Complaint  Patient presents with   Hospitalization Follow-up    HPI:  He is a 62 year old long term resident of this facility who has been hospitalized from 01-09-23 through 01-12-23. His medical history includes: TBI; diabetes; hyperlipidemia; hypertension; atrial fibrillation.  He presented to the ED after a hgb of 7.5 which was a 3 gm drop in hgb. At that time he denied any blood in his stool. Denied any nausea or vomiting. Denied any abdominal pain.  Acute in chronic anemia: due to presumed acute GI bleed in the setting of eliquis use. His hgb was 7.7 with a baseline between 10 and 11. He had a GI consult: had EGD and colonoscopy on 01-11-23. EGD without acute findings; the colonoscopy with polypectomy without other acute findings. Doubt signs of GI bleed; and was ok'ed to restart eliquis.  He did have a heme positive stool.    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 (CVA) GERD without esophagitis:  Multinodular goiter: Type 2 diabetes mellitus with neurological complications:    Past Medical History:  Diagnosis Date   A-fib (HCC)    DM (diabetes mellitus) (HCC)    History of seizures    Hypertension    Stroke [facility])     Past Surgical History:   Procedure Laterality Date   CHOLECYSTECTOMY     COLONOSCOPY WITH PROPOFOL N/A 01/11/2023   Procedure: COLONOSCOPY WITH PROPOFOL;  Surgeon: Corbin Ade, MD;  [facility] ENDO [facility];  Service: Endoscopy;  Laterality: N/A;   CRANIECTOMY Right 04/01/2017   Procedure: RIGHT DECOMPRESSIVE CRANIECTOMY;  Surgeon: Ditty, Loura Halt, MD;  Location: [facility] OR;  Service: Neurosurgery;  Laterality: Right;   ESOPHAGOGASTRODUODENOSCOPY N/A 04/13/2017   Procedure: ESOPHAGOGASTRODUODENOSCOPY (EGD);  Surgeon: Violeta Gelinas, MD;  Location: [facility] ENDOSCOPY;  Service: General;  Laterality: N/A;  bedside   ESOPHAGOGASTRODUODENOSCOPY (EGD) WITH PROPOFOL N/A 01/11/2023   Procedure: ESOPHAGOGASTRODUODENOSCOPY (EGD) WITH PROPOFOL;  Surgeon: Corbin Ade, MD;  Location: [facility] ENDO SUITE;  Service: Endoscopy;  Laterality: N/A;   INCISION AND DRAINAGE PERIRECTAL ABSCESS N/A 09/28/2017   Procedure: IRRIGATION AND DEBRIDEMENT PERIANAL ABSCESS;  Surgeon: Lucretia Roers, MD;  Location: [facility] ORS;  Service: General;  Laterality: N/A;   PEG PLACEMENT N/A 04/13/2017   Procedure: PERCUTANEOUS ENDOSCOPIC GASTROSTOMY (PEG) PLACEMENT;  Surgeon: Violeta Gelinas, MD;  Location: [facility] ENDOSCOPY;  Service: General;  Laterality: N/A;   POLYPECTOMY  01/11/2023   Procedure: POLYPECTOMY INTESTINAL;  Surgeon: Corbin Ade, MD;  Location[facility] ENDO [facility];  Service: Endoscopy;;    Social History   Socioeconomic History   Marital status: Unknown    Spouse name: Not on file   Number of children: Not on file   Years of education: Not on file   Highest education level:  Not on file  Occupational History   Not on file  Tobacco Use   Smoking status: Former    Types: Cigarettes   Smokeless tobacco: Never   Tobacco comments:    UTA  Vaping Use   Vaping Use: Never used  Substance and Sexual Activity   Alcohol use: No    Comment: UTA   Drug use: No    Comment: UTA   Sexual activity: Not Currently    Birth control/protection: None   Other Topics Concern   Not on file  Social History Narrative   Not on file   Social Determinants of Health   Financial Resource Strain: Not on file  Food Insecurity: No Food Insecurity (01/09/2023)   Hunger Vital Sign    Worried About Running Out of Food in the Last Year: Never true    Ran Out of Food in the Last Year: Never true  Transportation Needs: No Transportation Needs (01/09/2023)   PRAPARE - Administrator, Civil Service (Medical): No    Lack of Transportation (Non-Medical): No  Physical Activity: Not on file  Stress: Not on file  Social Connections: Not on file  Intimate Partner Violence: Not At Risk (01/09/2023)   Humiliation, Afraid, Rape, and Kick questionnaire    Fear of Current or Ex-Partner: No    Emotionally Abused: No    Physically Abused: No    Sexually Abused: No   Family History  Problem Relation Age of Onset   Hypertension Father    Colon cancer Neg Hx    Colon polyps Neg Hx       VITAL SIGNS BP 130/78   Pulse (!) 103   Temp 97.9 F (36.6 C)   Resp 16   Ht 5\' 6"  (1.676 m)   Wt 176 lb 6.4 oz (80 kg)   SpO2 97%   BMI 28.47 kg/m   Outpatient Encounter Medications as of 01/13/2023  Medication Sig   acetaminophen (TYLENOL) 325 MG tablet Take 650 mg by mouth every 4 (four) hours as needed. Every 4-6 hours as needed, not to exceed 3 grams in 24 hours   apixaban (ELIQUIS) 5 MG TABS tablet Take 5 mg by mouth 2 (two) times daily.    atorvastatin (LIPITOR) 20 MG tablet Take 20 mg by mouth at bedtime.   Cholecalciferol 1.25 MG (50000 UT) capsule Take 50,000 Units by mouth once a week. For Low Vitamin D Level on Monday   Dextromethorphan-quiNIDine (NUEDEXTA) 20-10 MG capsule Take 1 capsule by mouth 2 (two) times daily.   docusate (COLACE) 50 MG/5ML liquid Take 100 mg by mouth at bedtime.   Ensure Max Protein (ENSURE MAX PROTEIN) LIQD Take 11 oz by mouth daily. 9 am   gabapentin (NEURONTIN) 100 MG capsule Take 100 mg by mouth at bedtime.    levETIRAcetam (KEPPRA XR) 500 MG 24 hr tablet Take 500 mg by mouth daily. At bedtime 8 pm   loratadine (CLARITIN) 10 MG tablet Take 10 mg by mouth daily.   LORazepam (ATIVAN) 2 MG/ML injection Inject 1 mg into the vein every 15 (fifteen) minutes as needed for seizure (every 15 mins as needed up to 3 doses for seizure lasting longer than 2 minutes).   Menthol, Topical Analgesic, (BIOFREEZE) 4 % GEL Apply 1 application  topically at bedtime as needed. Apply to left shoulder   metFORMIN (GLUCOPHAGE-XR) 500 MG 24 hr tablet Take 500 mg by mouth daily with breakfast.   NON FORMULARY Diet:Regular   omeprazole (PRILOSEC)  40 MG capsule Take 1 capsule (40 mg total) by mouth daily.   ondansetron (ZOFRAN-ODT) 8 MG disintegrating tablet Take 8 mg by mouth every 6 (six) hours as needed for nausea or vomiting.   polyethylene glycol (MIRALAX / GLYCOLAX) 17 g packet Take 17 g by mouth daily. 9 pm   QUEtiapine (SEROQUEL) 100 MG tablet Take 100 mg by mouth at bedtime.   Semaglutide (RYBELSUS) 7 MG TABS Take 7 mg by mouth daily at 2 PM.   [DISCONTINUED] LORazepam (ATIVAN) 2 MG/ML concentrated solution Take 0.5 mLs (1 mg total) by mouth every 8 (eight) hours as needed for seizure or anxiety (injection). As needed for seizures lasting longer than 2 minutes. (Patient taking differently: Take 1 mg by mouth every 15 (fifteen) minutes as needed for seizure or anxiety (injection). As needed for seizures lasting longer than 2 minutes. Up to 3 doses)   No facility-administered encounter medications on file as of 01/13/2023.     SIGNIFICANT DIAGNOSTIC EXAMS  PREVIOUS   06-10-21: thyroid biopsy    TODAY  01-11-23: EGD: normal esophagus; stomach, doudenal bulb and 2dn portion of duodenum; peg scar apparent   01-11-23: colonoscopy:  Two 3-5 mm polyp at hepatic flexure and in the cecum; removed with cold snare Diverticulosis in sigmoid colon and in the descending colon, redundant elongated colon Otherwise normal Doubt signs  of GI bleed; may restart eliquis.    LABS REVIEWED PREVIOUS:     01-06-22: PSA 2.14 01-19-22: wbc 10.8; hgb 11.3; hct 38.7; mcv 75.6 plt 214; glucose 251; bun 11; creat 0.69; k+ 3.6; na++ 135; ca 8.6; gfr >60; protein 6.8; albumin 2.9 keppra 13.2 lactic acid 0.9 tsh 1.318 02-10-22: vitamin D 43.68  03-14-22; hgb a1c 5.7 03-28-22: glucose 336; bun 7; creat 0.61; k+ 3.6; na++ 137; ca 8.9; gfr>60 06-24-22: wbc 9.2; hgb 10.0; hct 34.9; mcv 87.7 plt 241; glucose 145; bun 8; creat 0.67; k+ 3.6; na++ 140; ca 8.5; gfr >60; protein 6.0; albumin 2.5; vitamin D 43.99; chol 116; ldl 68; trig 78; hdl 32; tsh 1.238 free t4: 0.79 hgb A1c 7.5 07-30-22: tsh 1.240 free t4: 0.88 t3: 118 08-31-22: tsh 2.255 free t3: 2.8; free t4: 0.82 09-29-22: ACR: 15 01-09-23: wbc 12.7; hgb 7.6; hct 28.5; mcv 75.4 plt 263; glucose 126; bun 13; creat 0.69; k+ 3.5; na++ 141; ca 9.0; gfr>60; protein 6.3 albumin 2.7 chol 99; ldl 46; trig 118; hdl 29; tsh 1.501 01-09-23 (#2); hgb 7.7; hct 28.9   TODAY  01-10-23: wbc 13.0; hgb 7.7; hct 28.6; mcv 74.5; plt 268; hgb A1c 5.3; iron 18; tibc 374 HIV nr 01-11-23: wbc 11.1; hgb 8.2; hct 29.7 mcv 72.8 plt 272 01-12-23: wbc 12.9; hgb 8.4; hct 31.5; mcv 73.4 plt 291  Review of Systems  Constitutional:  Negative for malaise/fatigue.  Respiratory:  Negative for cough and shortness of breath.   Cardiovascular:  Negative for chest pain, palpitations and leg swelling.  Gastrointestinal:  Negative for abdominal pain, constipation and heartburn.  Musculoskeletal:  Negative for back pain, joint pain and myalgias.  Skin: Negative.   Neurological:  Negative for dizziness.  Psychiatric/Behavioral:  The patient is not nervous/anxious.     Physical Exam Constitutional:      General: He is not in acute distress.    Appearance: He is well-developed. He is not diaphoretic.  Neck:     Thyroid: Thyroid mass and thyromegaly present.  Cardiovascular:     Rate and Rhythm: Normal rate and regular rhythm.  Pulses:  Normal pulses.     Heart sounds: Normal heart sounds.  Pulmonary:     Effort: Pulmonary effort is normal. No respiratory distress.     Breath sounds: Normal breath sounds.  Abdominal:     General: Bowel sounds are normal. There is no distension.     Palpations: Abdomen is soft.     Tenderness: There is no abdominal tenderness.  Musculoskeletal:     Cervical back: Neck supple.     Right lower leg: No edema.     Left lower leg: No edema.     Comments: Left hemiplegia  Lymphadenopathy:     Cervical: No cervical adenopathy.  Skin:    General: Skin is warm and dry.  Neurological:     Mental Status: He is alert. Mental status is at baseline.     Comments: 12-15-22: SLUMS 13/30  Psychiatric:        Mood and Affect: Mood normal.     ASSESSMENT/ PLAN:  TODAY  Acute on chronic anemia: hgb 8.4.  He is status post EGD/colonoscopy. His iron level is low at 18. Will repeat hgb/hct on 01-16-23.   2. Hemiplegia affecting left side as late effect of cerebrovascular accident (CVA) is off baclofen a this time.   3. GERD without esophagitis: will continue prilosec 40 mg daily   4. Multinodular goiter: is status post aspiration  5. Type 2 diabetes mellitus with neurological complications: hgb A1c 5.3 will continue metformin xr 500 mg daily and rybelsis 7 mg daily;   6. Hypertension associated with type 2 diabetes mellitus b/p 130/78 is presently off medications.   7. Delusions / paranoia: will continue seroquel 75 mg in the AM and 100 mg nightly   8. PAF (Paroxysmal atrial fibrillation) heart rate is stable will continue eliquis 5 mg twice daily   9. PBA: does have less verbal outbursts: will continue nuedexta 20/10 mg twice daily   10. Protein calorie malnutrition: albumin 2.7 will continue ensure daily; is not taking prostat has declined this  11. Dyslipidemia associated with type 2 diabetes: ldl 46 will continue lipitor 20 mg daily   12. Diabetic peripheral neuropathy: will continue  gabapentin 100 mg nightly   13. Seizure: no recent activity: will continue keppra xr 500 mg daily  14. Chronic constipation: will continue miralax daily and colace daily   15. History of CVA; due to embolism or right middle cerebral artery: is status post compression: right frontoemporoparteital craniotomy with duraplasty 2018: is on eliquis 5 mg twice daily  16. Moderate vascular dementia with psychotic disturbance: weight is 176 pounds will monitor        Synthia Innocent NP [facility]  call [phone]

## 2023-01-13 NOTE — Anesthesia Postprocedure Evaluation (Signed)
Anesthesia Post Note  Patient: Joe Wilkinson  Procedure(s) Performed: ESOPHAGOGASTRODUODENOSCOPY (EGD) WITH PROPOFOL COLONOSCOPY WITH PROPOFOL POLYPECTOMY INTESTINAL  Patient location during evaluation: Phase II Anesthesia Type: General Level of consciousness: awake Pain management: pain level controlled Vital Signs Assessment: post-procedure vital signs reviewed and stable Respiratory status: spontaneous breathing and respiratory function stable Cardiovascular status: blood pressure returned to baseline and stable Postop Assessment: no headache and no apparent nausea or vomiting Anesthetic complications: no Comments: Late entry   No notable events documented.   Last Vitals:  Vitals:   01/11/23 2116 01/12/23 0359  BP: 113/60 (!) 115/49  Pulse: (!) 59 81  Resp: 20 18  Temp: 36.8 C 36.6 C  SpO2: 97% 95%    Last Pain:  Vitals:   01/12/23 1000  TempSrc:   PainSc: 0-No pain                 Windell Norfolk

## 2023-01-14 ENCOUNTER — Encounter: Payer: Self-pay | Admitting: Internal Medicine

## 2023-01-16 ENCOUNTER — Other Ambulatory Visit (HOSPITAL_COMMUNITY)
Admission: RE | Admit: 2023-01-16 | Discharge: 2023-01-16 | Disposition: A | Discharge status: Home / Self Care | Payer: Medicaid Other | Source: Skilled Nursing Facility | Attending: Adult Health | Admitting: Adult Health

## 2023-01-16 ENCOUNTER — Non-Acute Institutional Stay (SKILLED_NURSING_FACILITY): Payer: Medicaid Other | Admitting: Adult Health

## 2023-01-16 DIAGNOSIS — D509 Iron deficiency anemia, unspecified: Secondary | ICD-10-CM | POA: Insufficient documentation

## 2023-01-16 DIAGNOSIS — D649 Anemia, unspecified: Secondary | ICD-10-CM

## 2023-01-16 LAB — CBC
HCT: 28 % — ABNORMAL LOW (ref 39.0–52.0)
Hemoglobin: 7.5 g/dL — ABNORMAL LOW (ref 13.0–17.0)
MCH: 20 pg — ABNORMAL LOW (ref 26.0–34.0)
MCHC: 26.8 g/dL — ABNORMAL LOW (ref 30.0–36.0)
MCV: 74.7 fL — ABNORMAL LOW (ref 80.0–100.0)
Platelets: 286 10*3/uL (ref 150–400)
RBC: 3.75 MIL/uL — ABNORMAL LOW (ref 4.22–5.81)
RDW: 21.2 % — ABNORMAL HIGH (ref 11.5–15.5)
WBC: 12.3 10*3/uL — ABNORMAL HIGH (ref 4.0–10.5)
nRBC: 0 % (ref 0.0–0.2)

## 2023-01-17 ENCOUNTER — Non-Acute Institutional Stay (SKILLED_NURSING_FACILITY): Payer: Medicaid Other | Admitting: Internal Medicine

## 2023-01-17 ENCOUNTER — Encounter: Payer: Self-pay | Admitting: Adult Health

## 2023-01-17 ENCOUNTER — Encounter: Payer: Self-pay | Admitting: Internal Medicine

## 2023-01-17 DIAGNOSIS — F01B2 Vascular dementia, moderate, with psychotic disturbance: Secondary | ICD-10-CM

## 2023-01-17 DIAGNOSIS — I480 Paroxysmal atrial fibrillation: Secondary | ICD-10-CM

## 2023-01-17 DIAGNOSIS — D649 Anemia, unspecified: Secondary | ICD-10-CM

## 2023-01-17 NOTE — Assessment & Plan Note (Signed)
Oral iron supplementation initiated.  Consider capsule camera evaluation for possible AVM if anemia progresses.

## 2023-01-17 NOTE — Progress Notes (Signed)
NURSING HOME LOCATION:  [facility] ROOM NUMBER: 113 D  CODE STATUS:  DNR  PCP:  Synthia Innocent NP  This is a nursing facility follow up visit for Nursing Facility readmission within 30 days  Interim medical record and care since last SNF visit was updated with review of diagnostic studies and change in clinical status since last visit were documented.  HPI: He was hospitalized 6/3 - 01/12/2023 for evaluation of significant anemia.  Although there was no history or evidence of bleeding dyscrasias; his H/H was 7.6/28.5.  This was in the context of Eliquis for PAF prophylaxis.  Iron panel revealed an iron of 18 and a ferritin of 4.  H/H had been 10/34.9 on 06/24/2022.  The absence of any history of bleeding dyscrasias is also in the context of vascular dementia. Leukocytosis of 12,900 was present which was suggested to be reactive.  On 6/5 he underwent EGD and colonoscopy which was negative except for superficial hyperplastic tissue in the cecum and tubular adenoma of the hepatic flexure.. A1c indicated prediabetes with a value of 5.3%.  Serial glucoses of while hospitalized revealed a low of 112 up to a high of 126.  Protein/caloric malnutrition was present with an albumin of 2.7 and total protein of 6.3. No active bleeding was documented.  Eliquis which had been held preoperatively was restarted 6/6. He was felt stable enough to return to the SNF.  Review of systems: Dementia invalidated responses.  He realizes the Gastroenterologist found "pellets" (polyps).  He denies any bleeding dyscrasias and staff has not reported such despite the significant progression of microcytic, hypochromic anemia as documented.  He describes "little heartburn" but no other GI symptoms.  Constitutional: No fever, significant weight change, fatigue  Eyes: No redness, discharge, pain, vision change ENT/mouth: No nasal congestion,  purulent discharge, earache, change in hearing, sore throat   Cardiovascular: No chest pain, palpitations, paroxysmal nocturnal dyspnea, edema  Respiratory: No cough, sputum production, hemoptysis, DOE, significant snoring, apnea   Gastrointestinal: No dysphagia, abdominal pain, nausea /vomiting, rectal bleeding, melena, change in bowels Genitourinary: No dysuria, hematuria, pyuria, incontinence, nocturia Musculoskeletal: No joint stiffness, joint swelling, weakness, pain Dermatologic: No rash, pruritus, change in appearance of skin Neurologic: No dizziness, headache, syncope, seizures, numbness, tingling Psychiatric: No significant anxiety, depression, insomnia, anorexia Endocrine: No change in hair/skin/nails, excessive thirst, excessive hunger, excessive urination  Hematologic/lymphatic: No significant bruising, lymphadenopathy, abnormal bleeding Allergy/immunology: No itchy/watery eyes, significant sneezing, urticaria, angioedema  Physical exam:  Pertinent or positive findings: As is always the case when he is not in bed he was ambulating throughout the facility pushing the wheelchair backwards with his right foot.  He has patchy alopecia over the crown.  There is a well-healed operative scar over the right crown.  There is intermittent ptosis and exotropia of the right eye.  He is edentulous.  Dysarthria is present.  Large asymmetric goiter is present.  Heart rhythm is irregular; rate is not accelerated.  Abdomen is protuberant.  The right dorsalis pedis pulse is stronger than the other pulses.  He has trace edema at the left sock line.  Left hemiparesis is present.  General appearance: Adequately nourished; no acute distress, increased work of breathing is present.   Lymphatic: No lymphadenopathy about the head, neck, axilla. Eyes: No conjunctival inflammation or lid edema is present. There is no scleral icterus. Ears:  External ear exam shows no significant lesions or deformities.   Nose:  External nasal examination shows no deformity  or  inflammation. Nasal mucosa are pink and moist without lesions, exudates Neck:  No thyromegaly, masses, tenderness noted.    Heart:  No gallop, murmur, click, rub .  Lungs: Chest clear to auscultation without wheezes, rhonchi, rales, rubs. Abdomen: Bowel sounds are normal. Abdomen is soft and nontender with no organomegaly, hernias, masses. GU: Deferred  Extremities:  No cyanosis, clubbing Neurologic exam :Balance, Rhomberg, finger to nose testing could not be completed due to clinical state Skin: Warm & dry w/o tenting. No significant lesions or rash.  See summary under each active problem in the Problem List with associated updated therapeutic plan

## 2023-01-17 NOTE — Progress Notes (Signed)
Location:  [facility] Room Number: 113 Place of Service:  SNF (31)   CODE STATUS: dnr   Allergies  Allergen Reactions   Cheese    Penicillins Hives    Has patient had a PCN reaction causing immediate rash, facial/tongue/throat swelling, SOB or lightheadedness with hypotension: Unknown Has patient had a PCN reaction causing severe rash involving mucus membranes or skin necrosis: Unknown Has patient had a PCN reaction that required hospitalization: No Has patient had a PCN reaction occurring within the last 10 years: No If all of the above answers are "NO", then may proceed with Cephalosporin use.     Chief Complaint  Patient presents with   Acute Visit    Follow up labs     HPI:  His hgb is back down to 7.5 there are no reports of obvious blood in stool. He is presently not on iron. He will need a follow up with GI for investigation of small bowel bleeding. He remains on eliquis at this time.   Past Medical History:  Diagnosis Date   A-fib [facility])    DM (diabetes mellitus) (HCC)    History of seizures    Hypertension    Stroke [facility])     Past Surgical History:  Procedure Laterality Date   CHOLECYSTECTOMY     COLONOSCOPY WITH PROPOFOL N/A 01/11/2023   Procedure: COLONOSCOPY WITH PROPOFOL;  Surgeon: Corbin Ade, MD;  [facility]: [facility] ENDO SUITE;  Service: Endoscopy;  Laterality: N/A;   CRANIECTOMY Right 04/01/2017   Procedure: RIGHT DECOMPRESSIVE CRANIECTOMY;  Surgeon: Ditty, Loura Halt, MD;  Location: [facility] OR;  Service: Neurosurgery;  Laterality: Right;   ESOPHAGOGASTRODUODENOSCOPY N/A 04/13/2017   Procedure: ESOPHAGOGASTRODUODENOSCOPY (EGD);  Surgeon: Violeta Gelinas, MD;  Location: [facility] ENDOSCOPY;  Service: General;  Laterality: N/A;  bedside   ESOPHAGOGASTRODUODENOSCOPY (EGD) WITH PROPOFOL N/A 01/11/2023   Procedure: ESOPHAGOGASTRODUODENOSCOPY (EGD) WITH PROPOFOL;  Surgeon: Corbin Ade, MD;  Location: [facility] ENDO [facility]ITE;  Service: Endoscopy;   Laterality: N/A;   INCISION AND DRAINAGE PERIRECTAL ABSCESS N/A 09/28/2017   Procedure: IRRIGATION AND DEBRIDEMENT PERIANAL ABSCESS;  Surgeon: Lucretia Roers, MD;  Location: [facility] ORS;  Service: General;  Laterality: N/A;   PEG PLACEMENT N/A 04/13/2017   Procedure: PERCUTANEOUS ENDOSCOPIC GASTROSTOMY (PEG) PLACEMENT;  Surgeon: Violeta Gelinas, MD;  Location: [facility] ENDOSCOPY;  Service: General;  Laterality: N/A;   POLYPECTOMY  01/11/2023   Procedure: POLYPECTOMY INTESTINAL;  Surgeon: Corbin Ade, MD;  Location: [facility] ENDO SUITE;  Service: Endoscopy;;    Social History   Socioeconomic History   Marital status: Unknown    Spouse name: Not on file   Number of children: Not on file   Years of education: Not on file   Highest education level: Not on file  Occupational History   Not on file  Tobacco Use   Smoking status: Former    Types: Cigarettes   Smokeless tobacco: Never   Tobacco comments:    UTA  Vaping Use   Vaping Use: Never used  Substance and Sexual Activity   Alcohol use: No    Comment: UTA   Drug use: No    Comment: UTA   Sexual activity: Not Currently    Birth control/protection: None  Other Topics Concern   Not on file  Social History Narrative   Not on file   Social Determinants of Health   Financial Resource Strain: Not on file  Food Insecurity: No Food Insecurity (01/09/2023)   Hunger Vital  Sign    Worried About Programme researcher, broadcasting/film/video in the Last Year: Never true    Ran Out of Food in the Last Year: Never true  Transportation Needs: No Transportation Needs (01/09/2023)   PRAPARE - Administrator, Civil Service (Medical): No    Lack of Transportation (Non-Medical): No  Physical Activity: Not on file  Stress: Not on file  Social Connections: Not on file  Intimate Partner Violence: Not At Risk (01/09/2023)   Humiliation, Afraid, Rape, and Kick questionnaire    Fear of Current or Ex-Partner: No    Emotionally Abused: No    Physically Abused: No     Sexually Abused: No   Family History  Problem Relation Age of Onset   Hypertension Father    Colon cancer Neg Hx    Colon polyps Neg Hx       VITAL SIGNS BP 110/62   Pulse 82   Temp (!) 97.2 F (36.2 C)   Resp (!) 22   Ht 5\' 6"  (1.676 m)   Wt 176 lb 6.4 oz (80 kg)   SpO2 95%   BMI 28.47 kg/m   Outpatient Encounter Medications as of 01/16/2023  Medication Sig   acetaminophen (TYLENOL) 325 MG tablet Take 650 mg by mouth every 4 (four) hours as needed. Every 4-6 hours as needed, not to exceed 3 grams in 24 hours   apixaban (ELIQUIS) 5 MG TABS tablet Take 5 mg by mouth 2 (two) times daily.    atorvastatin (LIPITOR) 20 MG tablet Take 20 mg by mouth at bedtime.   Cholecalciferol 1.25 MG (50000 UT) capsule Take 50,000 Units by mouth once a week. For Low Vitamin D Level on Monday   Dextromethorphan-quiNIDine (NUEDEXTA) 20-10 MG capsule Take 1 capsule by mouth 2 (two) times daily.   docusate (COLACE) 50 MG/5ML liquid Take 100 mg by mouth at bedtime.   Ensure Max Protein (ENSURE MAX PROTEIN) LIQD Take 11 oz by mouth daily. 9 am   gabapentin (NEURONTIN) 100 MG capsule Take 100 mg by mouth at bedtime.   levETIRAcetam (KEPPRA XR) 500 MG 24 hr tablet Take 500 mg by mouth daily. At bedtime 8 pm   loratadine (CLARITIN) 10 MG tablet Take 10 mg by mouth daily.   LORazepam (ATIVAN) 2 MG/ML injection Inject 1 mg into the vein every 15 (fifteen) minutes as needed for seizure (every 15 mins as needed up to 3 doses for seizure lasting longer than 2 minutes).   Menthol, Topical Analgesic, (BIOFREEZE) 4 % GEL Apply 1 application  topically at bedtime as needed. Apply to left shoulder   metFORMIN (GLUCOPHAGE-XR) 500 MG 24 hr tablet Take 500 mg by mouth daily with breakfast.   NON FORMULARY Diet:Regular   omeprazole (PRILOSEC) 40 MG capsule Take 1 capsule (40 mg total) by mouth daily.   ondansetron (ZOFRAN-ODT) 8 MG disintegrating tablet Take 8 mg by mouth every 6 (six) hours as needed for nausea or  vomiting.   polyethylene glycol (MIRALAX / GLYCOLAX) 17 g packet Take 17 g by mouth daily. 9 pm   QUEtiapine (SEROQUEL) 100 MG tablet Take 100 mg by mouth at bedtime.   Semaglutide (RYBELSUS) 7 MG TABS Take 7 mg by mouth daily at 2 PM.   No facility-administered encounter medications on file as of 01/16/2023.     SIGNIFICANT DIAGNOSTIC EXAMS   PREVIOUS   06-10-21: thyroid biopsy    TODAY  01-11-23: EGD: normal esophagus; stomach, doudenal bulb and 2dn portion of  duodenum; peg scar apparent   01-11-23: colonoscopy:  Two 3-5 mm polyp at hepatic flexure and in the cecum; removed with cold snare Diverticulosis in sigmoid colon and in the descending colon, redundant elongated colon Otherwise normal Doubt signs of GI bleed; may restart eliquis.    LABS REVIEWED PREVIOUS:     01-06-22: PSA 2.14 01-19-22: wbc 10.8; hgb 11.3; hct 38.7; mcv 75.6 plt 214; glucose 251; bun 11; creat 0.69; k+ 3.6; na++ 135; ca 8.6; gfr >60; protein 6.8; albumin 2.9 keppra 13.2 lactic acid 0.9 tsh 1.318 02-10-22: vitamin D 43.68  03-14-22; hgb a1c 5.7 03-28-22: glucose 336; bun 7; creat 0.61; k+ 3.6; na++ 137; ca 8.9; gfr>60 06-24-22: wbc 9.2; hgb 10.0; hct 34.9; mcv 87.7 plt 241; glucose 145; bun 8; creat 0.67; k+ 3.6; na++ 140; ca 8.5; gfr >60; protein 6.0; albumin 2.5; vitamin D 43.99; chol 116; ldl 68; trig 78; hdl 32; tsh 1.238 free t4: 0.79 hgb A1c 7.5 07-30-22: tsh 1.240 free t4: 0.88 t3: 118 08-31-22: tsh 2.255 free t3: 2.8; free t4: 0.82 09-29-22: ACR: 15 01-09-23: wbc 12.7; hgb 7.6; hct 28.5; mcv 75.4 plt 263; glucose 126; bun 13; creat 0.69; k+ 3.5; na++ 141; ca 9.0; gfr>60; protein 6.3 albumin 2.7 chol 99; ldl 46; trig 118; hdl 29; tsh 1.501 01-09-23 (#2); hgb 7.7; hct 28.9   TODAY  01-10-23: wbc 13.0; hgb 7.7; hct 28.6; mcv 74.5; plt 268; hgb A1c 5.3; iron 18; tibc 374 HIV nr 01-11-23: wbc 11.1; hgb 8.2; hct 29.7 mcv 72.8 plt 272 01-12-23: wbc 12.9; hgb 8.4; hct 31.5; mcv 73.4 plt 291 01-16-23: wbc 12.3; hgb 7.5;  hct 28.0; mcv 74.7 plt 286   Review of Systems  Constitutional:  Negative for malaise/fatigue.  Respiratory:  Negative for cough and shortness of breath.   Cardiovascular:  Negative for chest pain, palpitations and leg swelling.  Gastrointestinal:  Negative for abdominal pain, constipation and heartburn.  Musculoskeletal:  Negative for back pain, joint pain and myalgias.  Skin: Negative.   Neurological:  Negative for dizziness.  Psychiatric/Behavioral:  The patient is not nervous/anxious.    Physical Exam Constitutional:      General: He is not in acute distress.    Appearance: He is well-developed. He is not diaphoretic.  Neck:     Thyroid: Thyroid mass and thyromegaly present.  Cardiovascular:     Rate and Rhythm: Normal rate and regular rhythm.     Pulses: Normal pulses.     Heart sounds: Normal heart sounds.  Pulmonary:     Effort: Pulmonary effort is normal. No respiratory distress.     Breath sounds: Normal breath sounds.  Abdominal:     General: Bowel sounds are normal. There is no distension.     Palpations: Abdomen is soft.     Tenderness: There is no abdominal tenderness.  Musculoskeletal:     Cervical back: Neck supple.     Right lower leg: No edema.     Left lower leg: No edema.     Comments: Left hemiplegia  Lymphadenopathy:     Cervical: No cervical adenopathy.  Skin:    General: Skin is warm and dry.  Neurological:     Mental Status: He is alert. Mental status is at baseline.     Comments: 12-15-22: SLUMS 13/30   Psychiatric:        Mood and Affect: Mood normal.       ASSESSMENT/ PLAN:  TODAY  Acute on chronic anemia: hgb 7.5 will  repeat hgb/hct on 01-19-23. May need to return to GI    Synthia Innocent NP [facility]  call [phone]

## 2023-01-17 NOTE — Assessment & Plan Note (Addendum)
A-fib persists; rate is adequately controlled.  Eliquis will be continued with with close monitor of the H/H.

## 2023-01-17 NOTE — Patient Instructions (Signed)
See assessment and plan under each diagnosis in the problem list and acutely for this visit 

## 2023-01-17 NOTE — Assessment & Plan Note (Addendum)
Dementia invalidates review of systems responses.  He denies any bleeding dyscrasias or significant GI symptoms except for "little heartburn."  He validates that they found "pellets" (polyps) at colonoscopy. He continues to refuse insulin injections.

## 2023-01-19 ENCOUNTER — Other Ambulatory Visit (HOSPITAL_COMMUNITY)
Admission: RE | Admit: 2023-01-19 | Discharge: 2023-01-19 | Disposition: A | Discharge status: Home / Self Care | Payer: Medicaid Other | Source: Skilled Nursing Facility | Attending: Adult Health | Admitting: Adult Health

## 2023-01-19 DIAGNOSIS — D509 Iron deficiency anemia, unspecified: Secondary | ICD-10-CM | POA: Diagnosis present

## 2023-01-19 LAB — HEMOGLOBIN AND HEMATOCRIT, BLOOD
HCT: 31.8 % — ABNORMAL LOW (ref 39.0–52.0)
Hemoglobin: 8.3 g/dL — ABNORMAL LOW (ref 13.0–17.0)

## 2023-02-07 ENCOUNTER — Other Ambulatory Visit (HOSPITAL_COMMUNITY)
Admission: RE | Admit: 2023-02-07 | Discharge: 2023-02-07 | Disposition: A | Discharge status: Home / Self Care | Payer: Medicaid Other | Source: Skilled Nursing Facility | Attending: Internal Medicine | Admitting: Internal Medicine

## 2023-02-07 DIAGNOSIS — R946 Abnormal results of thyroid function studies: Secondary | ICD-10-CM | POA: Diagnosis present

## 2023-02-07 LAB — TSH: TSH: 1.886 u[IU]/mL (ref 0.350–4.500)

## 2023-02-07 LAB — T4, FREE: Free T4: 0.89 ng/dL (ref 0.61–1.12)

## 2023-02-08 ENCOUNTER — Ambulatory Visit: Payer: Medicaid Other | Admitting: "Endocrinology

## 2023-02-08 ENCOUNTER — Telehealth: Payer: Self-pay | Admitting: "Endocrinology

## 2023-02-08 LAB — T3, FREE: T3, Free: 2.6 pg/mL (ref 2.0–4.4)

## 2023-02-08 NOTE — Telephone Encounter (Signed)
Pt rescheduled appt for Sept, will he need to repeat labs and if so can you put a new order in?

## 2023-02-15 ENCOUNTER — Encounter: Payer: Self-pay | Admitting: Adult Health

## 2023-02-15 ENCOUNTER — Non-Acute Institutional Stay: Payer: Self-pay | Admitting: Adult Health

## 2023-02-15 DIAGNOSIS — F22 Delusional disorders: Secondary | ICD-10-CM | POA: Diagnosis not present

## 2023-02-15 DIAGNOSIS — I480 Paroxysmal atrial fibrillation: Secondary | ICD-10-CM | POA: Diagnosis not present

## 2023-02-15 DIAGNOSIS — E1159 Type 2 diabetes mellitus with other circulatory complications: Secondary | ICD-10-CM

## 2023-02-15 DIAGNOSIS — F482 Pseudobulbar affect: Secondary | ICD-10-CM | POA: Diagnosis not present

## 2023-02-15 DIAGNOSIS — I152 Hypertension secondary to endocrine disorders: Secondary | ICD-10-CM

## 2023-02-15 NOTE — Progress Notes (Signed)
Location:  [facility] Room Number: 113 D Place of Service:  SNF (31)   CODE STATUS: DNR  Allergies  Allergen Reactions   Cheese    Penicillins Hives    Has patient had a PCN reaction causing immediate rash, facial/tongue/throat swelling, SOB or lightheadedness with hypotension: Unknown Has patient had a PCN reaction causing severe rash involving mucus membranes or skin necrosis: Unknown Has patient had a PCN reaction that required hospitalization: No Has patient had a PCN reaction occurring within the last 10 years: No If all of the above answers are "NO", then may proceed with Cephalosporin use.     Chief Complaint  Patient presents with   Medical Management of Chronic Issues                    Hypertension associated with type 2 diabetes mellitus:   Delusions/paranoia PAF (paroxsymal atrial fibrillation)    PBA:     HPI:  He is a 62 year old long term resident of this facility being seen for the management of his chronic illnesses:  Hypertension associated with type 2 diabetes mellitus:   Delusions/paranoia PAF (paroxsymal atrial fibrillation)    PBA.  There are no reports of uncontrolled pain. He does get out of bed daily; is propelling himself about the facility. His weight is stable.   Past Medical History:  Diagnosis Date   A-fib [facility])    DM (diabetes mellitus) (HCC)    History of seizures    Hypertension    Stroke [facility])     Past Surgical History:  Procedure Laterality Date   CHOLECYSTECTOMY     COLONOSCOPY WITH PROPOFOL N/A 01/11/2023   Procedure: COLONOSCOPY WITH PROPOFOL;  Surgeon: Corbin Ade, MD;  Location: [facility];  Service: Endoscopy;  Laterality: N/A;   CRANIECTOMY Right 04/01/2017   Procedure: RIGHT DECOMPRESSIVE CRANIECTOMY;  Surgeon: Ditty, Loura Halt, MD;  Location: [facility] OR;  Service: Neurosurgery;  Laterality: Right;   ESOPHAGOGASTRODUODENOSCOPY N/A 04/13/2017   Procedure: ESOPHAGOGASTRODUODENOSCOPY (EGD);  Surgeon:  Violeta Gelinas, MD;  Location: [facility] ENDOSCOPY;  Service: General;  Laterality: N/A;  bedside   ESOPHAGOGASTRODUODENOSCOPY (EGD) WITH PROPOFOL N/A 01/11/2023   Procedure: ESOPHAGOGASTRODUODENOSCOPY (EGD) WITH PROPOFOL;  Surgeon: Corbin Ade, MD;  [facility]: [facility];  Service: Endoscopy;  Laterality: N/A;   INCISION AND DRAINAGE PERIRECTAL ABSCESS N/A 09/28/2017   Procedure: IRRIGATION AND DEBRIDEMENT PERIANAL ABSCESS;  Surgeon: Lucretia Roers, MD;  Location: [facility] ORS;  Service: General;  Laterality: N/A;   PEG PLACEMENT N/A 04/13/2017   Procedure: PERCUTANEOUS ENDOSCOPIC GASTROSTOMY (PEG) PLACEMENT;  Surgeon: Violeta Gelinas, MD;  Location: [facility] ENDOSCOPY;  Service: General;  Laterality: N/A;   POLYPECTOMY  01/11/2023   Procedure: POLYPECTOMY INTESTINAL;  Surgeon: Corbin Ade, MD;  [facility]: [facility];  Service: Endoscopy;;    Social History   Socioeconomic History   Marital status: Unknown    Spouse name: Not on file   Number of children: Not on file   Years of education: Not on file   Highest education level: Not on file  Occupational History   Not on file  Tobacco Use   Smoking status: Former    Types: Cigarettes   Smokeless tobacco: Never   Tobacco comments:    UTA  Vaping Use   Vaping Use: Never used  Substance and Sexual Activity   Alcohol use: No    Comment: UTA   Drug use: No    Comment: UTA  Sexual activity: Not Currently    Birth control/protection: None  Other Topics Concern   Not on file  Social History Narrative   Not on file   Social Determinants of Health   Financial Resource Strain: Not on file  Food Insecurity: No Food Insecurity (01/09/2023)   Hunger Vital Sign    Worried About Running Out of Food in the Last Year: Never true    Ran Out of Food in the Last Year: Never true  Transportation Needs: No Transportation Needs (01/09/2023)   PRAPARE - Administrator, Civil Service (Medical): No    Lack of Transportation (Non-Medical): No   Physical Activity: Not on file  Stress: Not on file  Social Connections: Not on file  Intimate Partner Violence: Not At Risk (01/09/2023)   Humiliation, Afraid, Rape, and Kick questionnaire    Fear of Current or Ex-Partner: No    Emotionally Abused: No    Physically Abused: No    Sexually Abused: No   Family History  Problem Relation Age of Onset   Hypertension Father    Colon cancer Neg Hx    Colon polyps Neg Hx       VITAL SIGNS BP 101/71   Pulse 78   Temp 98.6 F (37 C)   Ht 5\' 6"  (1.676 m)   Wt 170 lb (77.1 kg)   SpO2 98%   BMI 27.44 kg/m   Outpatient Encounter Medications as of 02/15/2023  Medication Sig   acetaminophen (TYLENOL) 325 MG tablet Take 650 mg by mouth every 4 (four) hours as needed. Every 4-6 hours as needed, not to exceed 3 grams in 24 hours   apixaban (ELIQUIS) 5 MG TABS tablet Take 5 mg by mouth 2 (two) times daily.    atorvastatin (LIPITOR) 20 MG tablet Take 20 mg by mouth at bedtime.   Cholecalciferol 1.25 MG (50000 UT) capsule Take 50,000 Units by mouth once a week. For Low Vitamin D Level on Monday   Dextromethorphan-quiNIDine (NUEDEXTA) 20-10 MG capsule Take 1 capsule by mouth 2 (two) times daily.   ferrous sulfate 325 (65 FE) MG EC tablet Take 325 mg by mouth daily.   gabapentin (NEURONTIN) 100 MG capsule Take 100 mg by mouth at bedtime.   levETIRAcetam (KEPPRA XR) 500 MG 24 hr tablet Take 500 mg by mouth daily. At bedtime 8 pm   loratadine (CLARITIN) 10 MG tablet Take 10 mg by mouth daily.   LORazepam (ATIVAN) 2 MG/ML injection Inject 1 mg into the vein every 15 (fifteen) minutes as needed for seizure (every 15 mins as needed up to 3 doses for seizure lasting longer than 2 minutes).   Menthol, Topical Analgesic, (BIOFREEZE) 4 % GEL Apply 1 application  topically at bedtime as needed. Apply to left shoulder   metFORMIN (GLUCOPHAGE-XR) 500 MG 24 hr tablet Take 500 mg by mouth daily with breakfast.   NON FORMULARY Diet:Regular   omeprazole  (PRILOSEC) 40 MG capsule Take 1 capsule (40 mg total) by mouth daily.   ondansetron (ZOFRAN-ODT) 8 MG disintegrating tablet Take 8 mg by mouth every 6 (six) hours as needed for nausea or vomiting.   polyethylene glycol (MIRALAX / GLYCOLAX) 17 g packet Take 17 g by mouth daily. 9 pm   QUEtiapine (SEROQUEL) 100 MG tablet Take 100 mg by mouth at bedtime.   QUEtiapine (SEROQUEL) 25 MG tablet Take 75 mg by mouth daily. At 10 am   Semaglutide (RYBELSUS) 7 MG TABS Take 7 mg by mouth  daily at 2 PM.   sennosides-docusate sodium (SENOKOT-S) 8.6-50 MG tablet Take 1 tablet by mouth daily as needed for constipation.   [DISCONTINUED] docusate (COLACE) 50 MG/5ML liquid Take 100 mg by mouth at bedtime.   [DISCONTINUED] Ensure Max Protein (ENSURE MAX PROTEIN) LIQD Take 11 oz by mouth daily. 9 am   No facility-administered encounter medications on file as of 02/15/2023.     SIGNIFICANT DIAGNOSTIC EXAMS  PREVIOUS   06-10-21: thyroid biopsy   01-11-23: EGD: normal esophagus; stomach, doudenal bulb and 2dn portion of duodenum; peg scar apparent   01-11-23: colonoscopy:  Two 3-5 mm polyp at hepatic flexure and in the cecum; removed with cold snare Diverticulosis in sigmoid colon and in the descending colon, redundant elongated colon Otherwise normal Doubt signs of GI bleed; may restart eliquis.   NO NEW EXAMS    LABS REVIEWED PREVIOUS:     02-10-22: vitamin D 43.68  03-14-22; hgb a1c 5.7 03-28-22: glucose 336; bun 7; creat 0.61; k+ 3.6; na++ 137; ca 8.9; gfr>60 06-24-22: wbc 9.2; hgb 10.0; hct 34.9; mcv 87.7 plt 241; glucose 145; bun 8; creat 0.67; k+ 3.6; na++ 140; ca 8.5; gfr >60; protein 6.0; albumin 2.5; vitamin D 43.99; chol 116; ldl 68; trig 78; hdl 32; tsh 1.238 free t4: 0.79 hgb A1c 7.5 07-30-22: tsh 1.240 free t4: 0.88 t3: 118 08-31-22: tsh 2.255 free t3: 2.8; free t4: 0.82 09-29-22: ACR: 15 01-09-23: wbc 12.7; hgb 7.6; hct 28.5; mcv 75.4 plt 263; glucose 126; bun 13; creat 0.69; k+ 3.5; na++ 141; ca  9.0; gfr>60; protein 6.3 albumin 2.7 chol 99; ldl 46; trig 118; hdl 29; tsh 1.501 01-09-23 (#2); hgb 7.7; hct 28.9  01-10-23: wbc 13.0; hgb 7.7; hct 28.6; mcv 74.5; plt 268; hgb A1c 5.3; iron 18; tibc 374 HIV nr 01-11-23: wbc 11.1; hgb 8.2; hct 29.7 mcv 72.8 plt 272 01-12-23: wbc 12.9; hgb 8.4; hct 31.5; mcv 73.4 plt 291 01-16-23: wbc 12.3; hgb 7.5; hct 28.0; mcv 74.7 plt 286   TODAY  01-19-23: hgb 8.3; hct 31.8 02-07-23: tsh 1.886 free t3: 2.6; free t4: 0.89  Review of Systems  Constitutional:  Negative for malaise/fatigue.  Respiratory:  Negative for cough and shortness of breath.   Cardiovascular:  Negative for chest pain, palpitations and leg swelling.  Gastrointestinal:  Negative for abdominal pain, constipation and heartburn.  Musculoskeletal:  Negative for back pain, joint pain and myalgias.  Skin: Negative.   Neurological:  Negative for dizziness.  Psychiatric/Behavioral:  The patient is not nervous/anxious.     Physical Exam Constitutional:      General: He is not in acute distress.    Appearance: He is well-developed. He is not diaphoretic.  Neck:     Thyroid: Thyroid mass and thyromegaly present.  Cardiovascular:     Rate and Rhythm: Normal rate and regular rhythm.     Pulses: Normal pulses.     Heart sounds: Normal heart sounds.  Pulmonary:     Effort: Pulmonary effort is normal. No respiratory distress.     Breath sounds: Normal breath sounds.  Abdominal:     General: Bowel sounds are normal. There is no distension.     Palpations: Abdomen is soft.     Tenderness: There is no abdominal tenderness.  Musculoskeletal:     Cervical back: Neck supple.     Right lower leg: No edema.     Left lower leg: No edema.     Comments: Left hemiplegia   Lymphadenopathy:  Cervical: No cervical adenopathy.  Skin:    General: Skin is warm and dry.  Neurological:     Mental Status: He is alert. Mental status is at baseline.     Comments: 12-15-22: SLUMS 13/30    Psychiatric:         Mood and Affect: Mood normal.       ASSESSMENT/ PLAN:  TODAY;   Hypertension associated with type 2 diabetes mellitus: b/p 101/71 is off medications  2. Delusions/paranoia will continue seroquel 100 mg twice daily   3. PAF (paroxsymal atrial fibrillation) heart rate is stable will continue eliquis 5 mg twice daily   4.  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 Hypochromic anemia: hgb 10.0; will monitor   6. Protein calorie malnutrition severe; albumin 2.5 has declined prostat  7. Dyslipidemia associated with type 2 diabetes mellitus: ldl 68; will continue lipitor 20 mg daily   8. Diabetic peripheral neuropathy: will continue gabapentin 100 mg nightly   9. Seizure: no recent activity will continue keppra xl 500 mg daily   10. Chronic constipation: will continue colace twice daily   11. 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 Vitamin D deficiency: 43.99 will is off supplement   13. Hemiplegia affecting left side as late effect of cerebrovascular accident (CVA) is off baclofen  14. GERD without esophagitis: will continue prilosec 40 mg daily   15. Multinodular goiter: is status post aspiration; is due for endocrinology appointment  16. Type 2 diabetes mellitus with neurological complications: hgb A1c 7.5 will continue metformin xr 500 mg daily and rybelsis 7 mg daily     Synthia Innocent NP [facility]  call [phone]

## 2023-02-20 ENCOUNTER — Ambulatory Visit (INDEPENDENT_AMBULATORY_CARE_PROVIDER_SITE_OTHER): Payer: Medicaid Other | Admitting: Gastroenterology

## 2023-02-20 ENCOUNTER — Encounter: Payer: Self-pay | Admitting: Gastroenterology

## 2023-02-20 VITALS — BP 101/42 | HR 96 | Temp 98.2°F | Ht 66.0 in | Wt 174.4 lb

## 2023-02-20 DIAGNOSIS — D500 Iron deficiency anemia secondary to blood loss (chronic): Secondary | ICD-10-CM

## 2023-02-20 DIAGNOSIS — R195 Other fecal abnormalities: Secondary | ICD-10-CM

## 2023-02-20 DIAGNOSIS — D649 Anemia, unspecified: Secondary | ICD-10-CM | POA: Diagnosis not present

## 2023-02-20 DIAGNOSIS — D509 Iron deficiency anemia, unspecified: Secondary | ICD-10-CM | POA: Insufficient documentation

## 2023-02-20 NOTE — Progress Notes (Signed)
GI Office Note    Referring Provider: Sharee Holster, NP Primary Care Physician:  Sharee Holster, NP  Primary Gastroenterologist: Hennie Duos. Marletta Lor, DO   Chief Complaint   Chief Complaint  Patient presents with   Low hemoglobin    History of Present Illness   Joe Wilkinson is a 62 y.o. male presenting today for hospital follow up of anemia, heme + stool in setting of anticoagulation.  In ED, Hgb 8.3. stool heme positive. Hgb down to 7.7 over night. EGD/colonoscopy as outlined below. Most current Hgb 8.3 on 01/19/23. Last year Hgb in the 10-11 range.  Labs consistent with IDA.  B12 and folate not known.  He is chronically anticoagulated with Eliquis.  Currently on oral iron.  Presents with staff today.  Patient denies any abdominal pain.  He states staff helps him with hygiene.  No reported melena or rectal bleeding.  His appetite is good.  He denies any vomiting or heartburn.  Mobilizes via wheelchair.  EGD 01/2023: -normal study  Colonoscopy 01/2023: -two 3-42mm polyps at hepatic flexure and cecum removed -path from cecum benign -path from hepatic flexure was tubular adenoma -7 year surveillance colonoscopy -diverticulosis    Medications   Current Outpatient Medications  Medication Sig Dispense Refill   acetaminophen (TYLENOL) 325 MG tablet Take 650 mg by mouth every 4 (four) hours as needed. Every 4-6 hours as needed, not to exceed 3 grams in 24 hours     apixaban (ELIQUIS) 5 MG TABS tablet Take 5 mg by mouth 2 (two) times daily.      atorvastatin (LIPITOR) 20 MG tablet Take 20 mg by mouth at bedtime.     Cholecalciferol 1.25 MG (50000 UT) capsule Take 50,000 Units by mouth once a week. For Low Vitamin D Level on Monday     Dextromethorphan-quiNIDine (NUEDEXTA) 20-10 MG capsule Take 1 capsule by mouth 2 (two) times daily.     ferrous sulfate 325 (65 FE) MG EC tablet Take 325 mg by mouth daily.     gabapentin (NEURONTIN) 100 MG capsule Take 100 mg by mouth at  bedtime.     glimepiride (AMARYL) 4 MG tablet Take 4 mg by mouth daily with breakfast.     levETIRAcetam (KEPPRA XR) 500 MG 24 hr tablet Take 500 mg by mouth daily. At bedtime 8 pm     loratadine (CLARITIN) 10 MG tablet Take 10 mg by mouth daily.     LORazepam (ATIVAN) 2 MG/ML injection Inject 1 mg into the vein every 15 (fifteen) minutes as needed for seizure (every 15 mins as needed up to 3 doses for seizure lasting longer than 2 minutes).     Menthol, Topical Analgesic, (BIOFREEZE) 4 % GEL Apply 1 application  topically at bedtime as needed. Apply to left shoulder     metFORMIN (GLUCOPHAGE-XR) 500 MG 24 hr tablet Take 500 mg by mouth daily with breakfast.     NON FORMULARY Diet:Regular     omeprazole (PRILOSEC) 40 MG capsule Take 1 capsule (40 mg total) by mouth daily. 90 capsule 3   ondansetron (ZOFRAN-ODT) 8 MG disintegrating tablet Take 8 mg by mouth every 6 (six) hours as needed for nausea or vomiting.     polyethylene glycol (MIRALAX / GLYCOLAX) 17 g packet Take 17 g by mouth daily. 9 pm     QUEtiapine (SEROQUEL) 100 MG tablet Take 100 mg by mouth at bedtime.     QUEtiapine (SEROQUEL) 25 MG tablet Take 75 mg  by mouth daily. At 10 am     Semaglutide (RYBELSUS) 7 MG TABS Take 7 mg by mouth daily at 2 PM.     sennosides-docusate sodium (SENOKOT-S) 8.6-50 MG tablet Take 1 tablet by mouth daily as needed for constipation.     No current facility-administered medications for this visit.    Allergies   Allergies as of 02/20/2023 - Review Complete 02/20/2023  Allergen Reaction Noted   Cheese  01/19/2018   Penicillins Hives 09/24/2017       Review of Systems   General: Negative for anorexia, weight loss, fever, chills, fatigue, weakness. ENT: Negative for hoarseness, difficulty swallowing , nasal congestion. CV: Negative for chest pain, angina, palpitations, dyspnea on exertion, peripheral edema.  Respiratory: Negative for dyspnea at rest, dyspnea on exertion, cough, sputum, wheezing.   GI: See history of present illness. GU:  Negative for dysuria, hematuria, urinary incontinence, urinary frequency, nocturnal urination.  Endo: Negative for unusual weight change.     Physical Exam   BP (!) 101/42 (BP Location: Right Arm, Patient Position: Sitting, Cuff Size: Normal)   Pulse 96   Temp 98.2 F (36.8 C) (Oral)   Ht 5\' 6"  (1.676 m)   Wt 174 lb 6.4 oz (79.1 kg) Comment: pt reported done on 02/06/2023  SpO2 96%   BMI 28.15 kg/m    General: Well-nourished, well-developed in no acute distress. In wheelchair. Limited conversation. Seems appropriate. Eyes: No icterus. Mouth: Oropharyngeal mucosa moist and pink  Lungs: Clear to auscultation bilaterally.  Heart: Regular rate and rhythm, no murmurs rubs or gallops.  Abdomen: Bowel sounds are normal, nontender, nondistended, no hepatosplenomegaly or masses,  no abdominal bruits or hernia , no rebound or guarding.  Rectal: not performed Extremities: No lower extremity edema. No clubbing or deformities. Neuro: Alert and oriented x 4   Skin: Warm and dry, no jaundice.   Psych: Alert and cooperative, normal mood and affect.  Labs   Lab Results  Component Value Date   TSH 1.886 02/07/2023   Lab Results  Component Value Date   NA 138 01/09/2023   CL 105 01/09/2023   K 3.9 01/09/2023   CO2 26 01/09/2023   BUN 12 01/09/2023   CREATININE 0.77 01/09/2023   GFRNONAA >60 01/09/2023   CALCIUM 9.1 01/09/2023   PHOS 2.8 03/28/2020   ALBUMIN 2.7 (L) 01/09/2023   GLUCOSE 112 (H) 01/09/2023   Lab Results  Component Value Date   ALT 11 01/09/2023   AST 8 (L) 01/09/2023   ALKPHOS 46 01/09/2023   BILITOT 0.3 01/09/2023   Lab Results  Component Value Date   WBC 12.3 (H) 01/16/2023   HGB 8.3 (L) 01/19/2023   HCT 31.8 (L) 01/19/2023   MCV 74.7 (L) 01/16/2023   PLT 286 01/16/2023   Lab Results  Component Value Date   HGB 8.3 (L) 01/19/2023   Lab Results  Component Value Date   HGBA1C 5.3 01/10/2023   Lab Results   Component Value Date   IRON 18 (L) 01/10/2023   TIBC 374 01/10/2023   FERRITIN 4 (L) 01/10/2023     Imaging Studies   No results found.  Assessment   *IDA *Hemoccult positive stool  Recent EGD/colonoscopy as outlined, without explanation for IDA/decline in Hgb. Remains on anticoagulation.   PLAN   CBC, iron/TIBC/ferritin, B12, folate. Based on labs, he may benefit from a small bowel capsule endoscopy, further recommendations to follow. Nursing home to report any black or bloody stools.  Leanna Battles. Melvyn Neth, MHS, PA-C [facility]

## 2023-02-20 NOTE — Patient Instructions (Addendum)
Please update labs this week. We will check CBC, iron/tibc/ferritin, B12, folate. Based on results, you may need a small bowel capsule study. Further recommendations to follow. Please report any black or bloody stool to [place] at [phone] or [phone] (fax).

## 2023-02-21 ENCOUNTER — Other Ambulatory Visit (HOSPITAL_COMMUNITY)
Admission: RE | Admit: 2023-02-21 | Discharge: 2023-02-21 | Disposition: A | Discharge status: Home / Self Care | Payer: Medicaid Other | Source: Skilled Nursing Facility | Attending: Adult Health | Admitting: Adult Health

## 2023-02-21 DIAGNOSIS — D509 Iron deficiency anemia, unspecified: Secondary | ICD-10-CM | POA: Diagnosis not present

## 2023-02-21 LAB — CBC WITH DIFFERENTIAL/PLATELET
Abs Immature Granulocytes: 0.04 10*3/uL (ref 0.00–0.07)
Basophils Absolute: 0.1 10*3/uL (ref 0.0–0.1)
Basophils Relative: 1 %
Eosinophils Absolute: 0.5 10*3/uL (ref 0.0–0.5)
Eosinophils Relative: 5 %
HCT: 34.6 % — ABNORMAL LOW (ref 39.0–52.0)
Hemoglobin: 9.4 g/dL — ABNORMAL LOW (ref 13.0–17.0)
Immature Granulocytes: 0 %
Lymphocytes Relative: 27 %
Lymphs Abs: 3 10*3/uL (ref 0.7–4.0)
MCH: 21.9 pg — ABNORMAL LOW (ref 26.0–34.0)
MCHC: 27.2 g/dL — ABNORMAL LOW (ref 30.0–36.0)
MCV: 80.7 fL (ref 80.0–100.0)
Monocytes Absolute: 1.1 10*3/uL — ABNORMAL HIGH (ref 0.1–1.0)
Monocytes Relative: 10 %
Neutro Abs: 6.4 10*3/uL (ref 1.7–7.7)
Neutrophils Relative %: 57 %
Platelets: 221 10*3/uL (ref 150–400)
RBC: 4.29 MIL/uL (ref 4.22–5.81)
RDW: 23.6 % — ABNORMAL HIGH (ref 11.5–15.5)
WBC: 11.2 10*3/uL — ABNORMAL HIGH (ref 4.0–10.5)
nRBC: 0 % (ref 0.0–0.2)

## 2023-02-21 LAB — FOLATE: Folate: 10.1 ng/mL (ref >5.9)

## 2023-02-21 LAB — IRON AND TIBC
Iron: 14 ug/dL — ABNORMAL LOW (ref 45–182)
Saturation Ratios: 4 % — ABNORMAL LOW (ref 17.9–39.5)
TIBC: 367 ug/dL (ref 250–450)
UIBC: 353 ug/dL

## 2023-02-21 LAB — VITAMIN B12: Vitamin B-12: 613 pg/mL (ref 180–914)

## 2023-02-21 LAB — FERRITIN: Ferritin: 11 ng/mL — ABNORMAL LOW (ref 24–336)

## 2023-02-22 ENCOUNTER — Encounter: Payer: Self-pay | Admitting: Adult Health

## 2023-02-22 ENCOUNTER — Non-Acute Institutional Stay (SKILLED_NURSING_FACILITY): Payer: Medicaid Other | Admitting: Adult Health

## 2023-02-22 DIAGNOSIS — D509 Iron deficiency anemia, unspecified: Secondary | ICD-10-CM

## 2023-02-22 NOTE — Progress Notes (Signed)
Location:  [facility] Room Number: 113 Place of Service:  SNF (31)   CODE STATUS: dnr   Allergies  Allergen Reactions   Cheese    Penicillins Hives    Has patient had a PCN reaction causing immediate rash, facial/tongue/throat swelling, SOB or lightheadedness with hypotension: Unknown Has patient had a PCN reaction causing severe rash involving mucus membranes or skin necrosis: Unknown Has patient had a PCN reaction that required hospitalization: No Has patient had a PCN reaction occurring within the last 10 years: No If all of the above answers are "NO", then may proceed with Cephalosporin use.     Chief Complaint  Patient presents with   Acute Visit    Lab follow up     HPI:  His anemia is slightly improved with a hgb of 9.4. His iron level remains low at 14 while on oral supplementation. He denies any pain; or discomfort. His appetite remains very good. He continues to get out of bed daily.   Past Medical History:  Diagnosis Date   A-fib [facility])    DM (diabetes mellitus) (HCC)    History of seizures    Hypertension    Stroke [facility])     Past Surgical History:  Procedure Laterality Date   CHOLECYSTECTOMY     COLONOSCOPY WITH PROPOFOL N/A 01/11/2023   Procedure: COLONOSCOPY WITH PROPOFOL;  Surgeon: Corbin Ade, MD;  Location: [facility] ENDO SUITE;  Service: Endoscopy;  Laterality: N/A;   CRANIECTOMY Right 04/01/2017   Procedure: RIGHT DECOMPRESSIVE CRANIECTOMY;  Surgeon: Ditty, Loura Halt, MD;  Location: [facility] OR;  Service: Neurosurgery;  Laterality: Right;   ESOPHAGOGASTRODUODENOSCOPY N/A 04/13/2017   Procedure: ESOPHAGOGASTRODUODENOSCOPY (EGD);  Surgeon: Violeta Gelinas, MD;  Location: [facility] ENDOSCOPY;  Service: General;  Laterality: N/A;  bedside   ESOPHAGOGASTRODUODENOSCOPY (EGD) WITH PROPOFOL N/A 01/11/2023   Procedure: ESOPHAGOGASTRODUODENOSCOPY (EGD) WITH PROPOFOL;  Surgeon: Corbin Ade, MD;  Location: [facility] ENDO [facility];  Service: Endoscopy;   Laterality: N/A;   INCISION AND DRAINAGE PERIRECTAL ABSCESS N/A 09/28/2017   Procedure: IRRIGATION AND DEBRIDEMENT PERIANAL ABSCESS;  Surgeon: Lucretia Roers, MD;  Location: [facility] ORS;  Service: General;  Laterality: N/A;   PEG PLACEMENT N/A 04/13/2017   Procedure: PERCUTANEOUS ENDOSCOPIC GASTROSTOMY (PEG) PLACEMENT;  Surgeon: Violeta Gelinas, MD;  Location: [facility] ENDOSCOPY;  Service: General;  Laterality: N/A;   POLYPECTOMY  01/11/2023   Procedure: POLYPECTOMY INTESTINAL;  Surgeon: Corbin Ade, MD;  Location: [facility] ENDO [facility];  Service: Endoscopy;;    Social History   Socioeconomic History   Marital status: Unknown    Spouse name: Not on file   Number of children: Not on file   Years of education: Not on file   Highest education level: Not on file  Occupational History   Not on file  Tobacco Use   Smoking status: Former    Types: Cigarettes   Smokeless tobacco: Never   Tobacco comments:    UTA  Vaping Use   Vaping status: Never Used  Substance and Sexual Activity   Alcohol use: No    Comment: UTA   Drug use: No    Comment: UTA   Sexual activity: Not Currently    Birth control/protection: None  Other Topics Concern   Not on file  Social History Narrative   Not on file   Social Determinants of Health   Financial Resource Strain: Not on file  Food Insecurity: No Food Insecurity (01/09/2023)   Hunger Vital Sign  Worried About Programme researcher, broadcasting/film/video in the Last Year: Never true    Ran Out of Food in the Last Year: Never true  Transportation Needs: No Transportation Needs (01/09/2023)   PRAPARE - Administrator, Civil Service (Medical): No    Lack of Transportation (Non-Medical): No  Physical Activity: Not on file  Stress: Not on file  Social Connections: Not on file  Intimate Partner Violence: Not At Risk (01/09/2023)   Humiliation, Afraid, Rape, and Kick questionnaire    Fear of Current or Ex-Partner: No    Emotionally Abused: No    Physically Abused: No     Sexually Abused: No   Family History  Problem Relation Age of Onset   Hypertension Father    Colon cancer Neg Hx    Colon polyps Neg Hx       VITAL SIGNS BP 132/64   Pulse (!) 56   Temp 98 F (36.7 C)   Resp 20   Ht 5\' 7"  (1.702 m)   Wt 170 lb 6.4 oz (77.3 kg)   SpO2 100%   BMI 26.69 kg/m   Outpatient Encounter Medications as of 02/22/2023  Medication Sig   acetaminophen (TYLENOL) 325 MG tablet Take 650 mg by mouth every 4 (four) hours as needed. Every 4-6 hours as needed, not to exceed 3 grams in 24 hours   apixaban (ELIQUIS) 5 MG TABS tablet Take 5 mg by mouth 2 (two) times daily.    atorvastatin (LIPITOR) 20 MG tablet Take 20 mg by mouth at bedtime.   Cholecalciferol 1.25 MG (50000 UT) capsule Take 50,000 Units by mouth once a week. For Low Vitamin D Level on Monday   Dextromethorphan-quiNIDine (NUEDEXTA) 20-10 MG capsule Take 1 capsule by mouth 2 (two) times daily.   ferrous sulfate 325 (65 FE) MG EC tablet Take 325 mg by mouth daily.   gabapentin (NEURONTIN) 100 MG capsule Take 100 mg by mouth at bedtime.   glimepiride (AMARYL) 4 MG tablet Take 4 mg by mouth daily with breakfast.   levETIRAcetam (KEPPRA XR) 500 MG 24 hr tablet Take 500 mg by mouth daily. At bedtime 8 pm   loratadine (CLARITIN) 10 MG tablet Take 10 mg by mouth daily.   LORazepam (ATIVAN) 2 MG/ML injection Inject 1 mg into the vein every 15 (fifteen) minutes as needed for seizure (every 15 mins as needed up to 3 doses for seizure lasting longer than 2 minutes).   Menthol, Topical Analgesic, (BIOFREEZE) 4 % GEL Apply 1 application  topically at bedtime as needed. Apply to left shoulder   metFORMIN (GLUCOPHAGE-XR) 500 MG 24 hr tablet Take 500 mg by mouth daily with breakfast.   NON FORMULARY Diet:Regular   omeprazole (PRILOSEC) 40 MG capsule Take 1 capsule (40 mg total) by mouth daily.   ondansetron (ZOFRAN-ODT) 8 MG disintegrating tablet Take 8 mg by mouth every 6 (six) hours as needed for nausea or vomiting.    polyethylene glycol (MIRALAX / GLYCOLAX) 17 g packet Take 17 g by mouth daily. 9 pm   QUEtiapine (SEROQUEL) 100 MG tablet Take 100 mg by mouth at bedtime.   QUEtiapine (SEROQUEL) 25 MG tablet Take 75 mg by mouth daily. At 10 am   Semaglutide (RYBELSUS) 7 MG TABS Take 7 mg by mouth daily at 2 PM.   sennosides-docusate sodium (SENOKOT-S) 8.6-50 MG tablet Take 1 tablet by mouth daily as needed for constipation.   No facility-administered encounter medications on file as of 02/22/2023.  SIGNIFICANT DIAGNOSTIC EXAMS  PREVIOUS   06-10-21: thyroid biopsy   01-11-23: EGD: normal esophagus; stomach, doudenal bulb and 2dn portion of duodenum; peg scar apparent   01-11-23: colonoscopy:  Two 3-5 mm polyp at hepatic flexure and in the cecum; removed with cold snare Diverticulosis in sigmoid colon and in the descending colon, redundant elongated colon Otherwise normal Doubt signs of GI bleed; may restart eliquis.   NO NEW EXAMS    LABS REVIEWED PREVIOUS:     02-10-22: vitamin D 43.68  03-14-22; hgb a1c 5.7 03-28-22: glucose 336; bun 7; creat 0.61; k+ 3.6; na++ 137; ca 8.9; gfr>60 06-24-22: wbc 9.2; hgb 10.0; hct 34.9; mcv 87.7 plt 241; glucose 145; bun 8; creat 0.67; k+ 3.6; na++ 140; ca 8.5; gfr >60; protein 6.0; albumin 2.5; vitamin D 43.99; chol 116; ldl 68; trig 78; hdl 32; tsh 1.238 free t4: 0.79 hgb A1c 7.5 07-30-22: tsh 1.240 free t4: 0.88 t3: 118 08-31-22: tsh 2.255 free t3: 2.8; free t4: 0.82 09-29-22: ACR: 15 01-09-23: wbc 12.7; hgb 7.6; hct 28.5; mcv 75.4 plt 263; glucose 126; bun 13; creat 0.69; k+ 3.5; na++ 141; ca 9.0; gfr>60; protein 6.3 albumin 2.7 chol 99; ldl 46; trig 118; hdl 29; tsh 1.501 01-09-23 (#2); hgb 7.7; hct 28.9  01-10-23: wbc 13.0; hgb 7.7; hct 28.6; mcv 74.5; plt 268; hgb A1c 5.3; iron 18; tibc 374 HIV nr 01-11-23: wbc 11.1; hgb 8.2; hct 29.7 mcv 72.8 plt 272 01-12-23: wbc 12.9; hgb 8.4; hct 31.5; mcv 73.4 plt 291 01-16-23: wbc 12.3; hgb 7.5; hct 28.0; mcv 74.7 plt 286   01-19-23: hgb 8.3; hct 31.8 02-07-23: tsh 1.886 free t3: 2.6; free t4: 0.89  TODAY  02-20-23: wbc 11.2; hgb 9.4; hct 34.6; mcv 80.7 plt 221; folate 10.1; vitamin B12: 613; iron 14 tibc 367; ferritin 11.   Review of Systems  Constitutional:  Negative for malaise/fatigue.  Respiratory:  Negative for cough and shortness of breath.   Cardiovascular:  Negative for chest pain, palpitations and leg swelling.  Gastrointestinal:  Negative for abdominal pain, constipation and heartburn.  Musculoskeletal:  Negative for back pain, joint pain and myalgias.  Skin: Negative.   Neurological:  Negative for dizziness.  Psychiatric/Behavioral:  The patient is not nervous/anxious.     Physical Exam Constitutional:      General: He is not in acute distress.    Appearance: He is well-developed. He is not diaphoretic.  Neck:     Thyroid: Thyroid mass and thyromegaly present.  Cardiovascular:     Rate and Rhythm: Normal rate and regular rhythm.     Pulses: Normal pulses.     Heart sounds: Normal heart sounds.  Pulmonary:     Effort: Pulmonary effort is normal. No respiratory distress.     Breath sounds: Normal breath sounds.  Abdominal:     General: Bowel sounds are normal. There is no distension.     Palpations: Abdomen is soft.     Tenderness: There is no abdominal tenderness.  Musculoskeletal:     Cervical back: Neck supple.     Right lower leg: No edema.     Left lower leg: No edema.     Comments: Left hemiplegia    Lymphadenopathy:     Cervical: No cervical adenopathy.  Skin:    General: Skin is warm and dry.  Neurological:     Mental Status: He is alert.     Comments: 12-15-22: SLUMS 13/30     Psychiatric:  Mood and Affect: Mood normal.     ASSESSMENT/ PLAN:  TODAY  Iron deficiency anemia unspecified anemia type: hgb 9.4; iron level 14. Will increase iron twice daily; may need to go to same day for iron infusion. Will monitor his status.    Synthia Innocent NP [facility]   call [phone]

## 2023-02-25 ENCOUNTER — Telehealth: Payer: Self-pay | Admitting: Gastroenterology

## 2023-02-25 NOTE — Telephone Encounter (Signed)
Labs showed normal B12/folate. Findings of IDA. Hgb slightly improved but not at baseline.   I would offer completing GI work up with small bowel capsule study.   Please repeat CBC, ferritin in 6 weeks.

## 2023-03-01 ENCOUNTER — Other Ambulatory Visit: Payer: Self-pay

## 2023-03-01 DIAGNOSIS — D500 Iron deficiency anemia secondary to blood loss (chronic): Secondary | ICD-10-CM

## 2023-03-01 NOTE — Telephone Encounter (Signed)
Called [facility] and spoke with [place]. Scheduled for 8/12. Will fax instructions to her at [phone].

## 2023-03-01 NOTE — Telephone Encounter (Signed)
Spoke with Nurse Kathie Rhodes at [facility] and made her aware of recommendations. Will fax lab orders to facility in 6 weeks to have completed.   Mindy/Halbert Jesson: Please contact facility to schedule small bowel capsule study.

## 2023-03-16 ENCOUNTER — Encounter: Payer: Self-pay | Admitting: *Deleted

## 2023-03-16 ENCOUNTER — Telehealth: Payer: Self-pay | Admitting: *Deleted

## 2023-03-16 NOTE — Telephone Encounter (Signed)
Tashi Band from the [facility] called and states that pt's iron has not been stopped for his procedure on Monday. Pt has been rescheduled until 03/22/23 at 8:30 am. Updated instructions sent to [facility] via fax @[phone].

## 2023-03-20 NOTE — Telephone Encounter (Signed)
Spoke with Kathie Rhodes at [facility] and informed her that procedure would be cancelled until a PA has been done. We will call back to schedule pt. She verbalized understanding.

## 2023-03-20 NOTE — Telephone Encounter (Signed)
Spoke with Renee at [facility] tracks regarding prior authorization for pt. She states that all prior authorization has to be done via the web portal. She says they can't be done over the phone. Advised her that procedure is scheduled for 03/22/23 and can't get access to [place] tracks until it has been approved with our administers. She again said it needs to go through the web portal.  Ref # [id number]

## 2023-03-22 ENCOUNTER — Encounter (HOSPITAL_COMMUNITY): Admission: RE | Payer: Self-pay | Source: Home / Self Care

## 2023-03-22 ENCOUNTER — Encounter: Payer: Self-pay | Admitting: Adult Health

## 2023-03-22 ENCOUNTER — Ambulatory Visit (HOSPITAL_COMMUNITY): Admission: RE | Admit: 2023-03-22 | Payer: Medicaid Other | Source: Home / Self Care | Admitting: Internal Medicine

## 2023-03-22 ENCOUNTER — Other Ambulatory Visit (HOSPITAL_COMMUNITY)
Admission: RE | Admit: 2023-03-22 | Discharge: 2023-03-22 | Disposition: A | Discharge status: Home / Self Care | Payer: Medicaid Other | Source: Skilled Nursing Facility | Attending: Internal Medicine | Admitting: Internal Medicine

## 2023-03-22 ENCOUNTER — Non-Acute Institutional Stay (SKILLED_NURSING_FACILITY): Payer: Medicaid Other | Admitting: Adult Health

## 2023-03-22 DIAGNOSIS — R195 Other fecal abnormalities: Secondary | ICD-10-CM | POA: Insufficient documentation

## 2023-03-22 DIAGNOSIS — U071 COVID-19: Secondary | ICD-10-CM | POA: Diagnosis not present

## 2023-03-22 DIAGNOSIS — E119 Type 2 diabetes mellitus without complications: Secondary | ICD-10-CM

## 2023-03-22 DIAGNOSIS — D500 Iron deficiency anemia secondary to blood loss (chronic): Secondary | ICD-10-CM | POA: Insufficient documentation

## 2023-03-22 LAB — CBC WITH DIFFERENTIAL/PLATELET
Abs Immature Granulocytes: 0 10*3/uL (ref 0.00–0.07)
Band Neutrophils: 1 %
Basophils Absolute: 0.1 10*3/uL (ref 0.0–0.1)
Basophils Relative: 1 %
Blasts: 0 %
Eosinophils Absolute: 0.2 10*3/uL (ref 0.0–0.5)
Eosinophils Relative: 3 %
HCT: 44.5 % (ref 39.0–52.0)
Hemoglobin: 13 g/dL (ref 13.0–17.0)
Lymphocytes Relative: 48 %
Lymphs Abs: 3.3 10*3/uL (ref 0.7–4.0)
MCH: 24.2 pg — ABNORMAL LOW (ref 26.0–34.0)
MCHC: 29.2 g/dL — ABNORMAL LOW (ref 30.0–36.0)
MCV: 82.7 fL (ref 80.0–100.0)
Metamyelocytes Relative: 0 %
Monocytes Absolute: 0.5 10*3/uL (ref 0.1–1.0)
Monocytes Relative: 8 %
Myelocytes: 0 %
Neutro Abs: 2.7 10*3/uL (ref 1.7–7.7)
Neutrophils Relative %: 39 %
Other: 0 %
Promyelocytes Relative: 0 %
RBC: 5.38 MIL/uL (ref 4.22–5.81)
RDW: 20 % — ABNORMAL HIGH (ref 11.5–15.5)
WBC: 6.8 10*3/uL (ref 4.0–10.5)
nRBC: 0 % (ref 0.0–0.2)
nRBC: 0 /100 WBC

## 2023-03-22 LAB — D-DIMER, QUANTITATIVE: D-Dimer, Quant: <0.27 ug{FEU}/mL (ref 0.00–0.50)

## 2023-03-22 LAB — BASIC METABOLIC PANEL
Anion gap: 8 (ref 5–15)
BUN: 8 mg/dL (ref 8–23)
CO2: 21 mmol/L — ABNORMAL LOW (ref 22–32)
Calcium: 8.7 mg/dL — ABNORMAL LOW (ref 8.9–10.3)
Chloride: 105 mmol/L (ref 98–111)
Creatinine, Ser: 0.78 mg/dL (ref 0.61–1.24)
GFR, Estimated: >60 mL/min (ref >60)
Glucose, Bld: 221 mg/dL — ABNORMAL HIGH (ref 70–99)
Potassium: 4 mmol/L (ref 3.5–5.1)
Sodium: 134 mmol/L — ABNORMAL LOW (ref 135–145)

## 2023-03-22 SURGERY — IMAGING PROCEDURE, GI TRACT, INTRALUMINAL, VIA CAPSULE

## 2023-03-22 NOTE — Progress Notes (Signed)
Location:  [facility] Room Number: 113 Place of Service:  SNF (31)   CODE STATUS: dnr   Allergies  Allergen Reactions   Cheese    Penicillins Hives    Has patient had a PCN reaction causing immediate rash, facial/tongue/throat swelling, SOB or lightheadedness with hypotension: Unknown Has patient had a PCN reaction causing severe rash involving mucus membranes or skin necrosis: Unknown Has patient had a PCN reaction that required hospitalization: No Has patient had a PCN reaction occurring within the last 10 years: No If all of the above answers are "NO", then may proceed with Cephalosporin use.     Chief Complaint  Patient presents with   Acute Visit    COVID +    HPI:  He has tested positive for COVID. There are no reports of fevers; no cough; no shortness of breath. He denies any aches or pains. His blood work is without significant findings.   Past Medical History:  Diagnosis Date   A-fib [facility])    DM (diabetes mellitus) (HCC)    History of seizures    Hypertension    Stroke [facility])     Past Surgical History:  Procedure Laterality Date   CHOLECYSTECTOMY     COLONOSCOPY WITH PROPOFOL N/A 01/11/2023   Procedure: COLONOSCOPY WITH PROPOFOL;  Surgeon: Corbin Ade, MD;  Location: [facility] ENDO SUITE;  Service: Endoscopy;  Laterality: N/A;   CRANIECTOMY Right 04/01/2017   Procedure: RIGHT DECOMPRESSIVE CRANIECTOMY;  Surgeon: Ditty, Loura Halt, MD;  Location: [facility] OR;  Service: Neurosurgery;  Laterality: Right;   ESOPHAGOGASTRODUODENOSCOPY N/A 04/13/2017   Procedure: ESOPHAGOGASTRODUODENOSCOPY (EGD);  Surgeon: Violeta Gelinas, MD;  Location: [facility] ENDOSCOPY;  Service: General;  Laterality: N/A;  bedside   ESOPHAGOGASTRODUODENOSCOPY (EGD) WITH PROPOFOL N/A 01/11/2023   Procedure: ESOPHAGOGASTRODUODENOSCOPY (EGD) WITH PROPOFOL;  Surgeon: Corbin Ade, MD;  Location: [facility] ENDO [facility]ITE;  Service: Endoscopy;  Laterality: N/A;   INCISION AND DRAINAGE PERIRECTAL  ABSCESS N/A 09/28/2017   Procedure: IRRIGATION AND DEBRIDEMENT PERIANAL ABSCESS;  Surgeon: Lucretia Roers, MD;  Location: [facility] ORS;  Service: General;  Laterality: N/A;   PEG PLACEMENT N/A 04/13/2017   Procedure: PERCUTANEOUS ENDOSCOPIC GASTROSTOMY (PEG) PLACEMENT;  Surgeon: Violeta Gelinas, MD;  Location: [facility] ENDOSCOPY;  Service: General;  Laterality: N/A;   POLYPECTOMY  01/11/2023   Procedure: POLYPECTOMY INTESTINAL;  Surgeon: Corbin Ade, MD;  Location: [facility] ENDO SUITE;  Service: Endoscopy;;    Social History   Socioeconomic History   Marital status: Unknown    Spouse name: Not on file   Number of children: Not on file   Years of education: Not on file   Highest education level: Not on file  Occupational History   Not on file  Tobacco Use   Smoking status: Former    Types: Cigarettes   Smokeless tobacco: Never   Tobacco comments:    UTA  Vaping Use   Vaping status: Never Used  Substance and Sexual Activity   Alcohol use: No    Comment: UTA   Drug use: No    Comment: UTA   Sexual activity: Not Currently    Birth control/protection: None  Other Topics Concern   Not on file  Social History Narrative   Not on file   Social Determinants of Health   Financial Resource Strain: Not on file  Food Insecurity: No Food Insecurity (01/09/2023)   Hunger Vital Sign    Worried About Running Out of Food in the Last Year:  Never true    Ran Out of Food in the Last Year: Never true  Transportation Needs: No Transportation Needs (01/09/2023)   PRAPARE - Administrator, Civil Service (Medical): No    Lack of Transportation (Non-Medical): No  Physical Activity: Not on file  Stress: Not on file  Social Connections: Not on file  Intimate Partner Violence: Not At Risk (01/09/2023)   Humiliation, Afraid, Rape, and Kick questionnaire    Fear of Current or Ex-Partner: No    Emotionally Abused: No    Physically Abused: No    Sexually Abused: No   Family History  Problem  Relation Age of Onset   Hypertension Father    Colon cancer Neg Hx    Colon polyps Neg Hx       VITAL SIGNS BP 100/74   Pulse 90   Temp 98.7 F (37.1 C)   Resp 14   Ht 5\' 6"  (1.676 m)   Wt 172 lb 1.6 oz (78.1 kg)   SpO2 97%   BMI 27.78 kg/m   Outpatient Encounter Medications as of 03/22/2023  Medication Sig   acetaminophen (TYLENOL) 325 MG tablet Take 650 mg by mouth every 4 (four) hours as needed. Every 4-6 hours as needed, not to exceed 3 grams in 24 hours   apixaban (ELIQUIS) 5 MG TABS tablet Take 5 mg by mouth 2 (two) times daily.    atorvastatin (LIPITOR) 20 MG tablet Take 20 mg by mouth at bedtime.   Cholecalciferol 1.25 MG (50000 UT) capsule Take 50,000 Units by mouth once a week. For Low Vitamin D Level on Monday   Dextromethorphan-quiNIDine (NUEDEXTA) 20-10 MG capsule Take 1 capsule by mouth 2 (two) times daily.   ferrous sulfate 325 (65 FE) MG EC tablet Take 325 mg by mouth 2 (two) times daily.   gabapentin (NEURONTIN) 100 MG capsule Take 100 mg by mouth at bedtime.   glimepiride (AMARYL) 4 MG tablet Take 4 mg by mouth daily with breakfast.   levETIRAcetam (KEPPRA XR) 500 MG 24 hr tablet Take 500 mg by mouth daily. At bedtime 8 pm   loratadine (CLARITIN) 10 MG tablet Take 10 mg by mouth daily.   LORazepam (ATIVAN) 2 MG/ML injection Inject 1 mg into the vein every 15 (fifteen) minutes as needed for seizure (every 15 mins as needed up to 3 doses for seizure lasting longer than 2 minutes).   Menthol, Topical Analgesic, (BIOFREEZE) 4 % GEL Apply 1 application  topically at bedtime as needed. Apply to left shoulder   metFORMIN (GLUCOPHAGE-XR) 500 MG 24 hr tablet Take 500 mg by mouth daily with breakfast.   NON FORMULARY Diet:Regular   omeprazole (PRILOSEC) 40 MG capsule Take 1 capsule (40 mg total) by mouth daily.   ondansetron (ZOFRAN-ODT) 8 MG disintegrating tablet Take 8 mg by mouth every 6 (six) hours as needed for nausea or vomiting.   polyethylene glycol (MIRALAX /  GLYCOLAX) 17 g packet Take 17 g by mouth daily. 9 pm   QUEtiapine (SEROQUEL) 100 MG tablet Take 100 mg by mouth at bedtime.   QUEtiapine (SEROQUEL) 25 MG tablet Take 75 mg by mouth daily. At 10 am   Semaglutide (RYBELSUS) 7 MG TABS Take 7 mg by mouth daily at 2 PM.   sennosides-docusate sodium (SENOKOT-S) 8.6-50 MG tablet Take 1 tablet by mouth daily as needed for constipation.   No facility-administered encounter medications on file as of 03/22/2023.     SIGNIFICANT DIAGNOSTIC EXAMS  PREVIOUS  06-10-21: thyroid biopsy   01-11-23: EGD: normal esophagus; stomach, doudenal bulb and 2dn portion of duodenum; peg scar apparent   01-11-23: colonoscopy:  Two 3-5 mm polyp at hepatic flexure and in the cecum; removed with cold snare Diverticulosis in sigmoid colon and in the descending colon, redundant elongated colon Otherwise normal Doubt signs of GI bleed; may restart eliquis.   NO NEW EXAMS    LABS REVIEWED PREVIOUS:     03-14-22; hgb a1c 5.7 03-28-22: glucose 336; bun 7; creat 0.61; k+ 3.6; na++ 137; ca 8.9; gfr>60 06-24-22: wbc 9.2; hgb 10.0; hct 34.9; mcv 87.7 plt 241; glucose 145; bun 8; creat 0.67; k+ 3.6; na++ 140; ca 8.5; gfr >60; protein 6.0; albumin 2.5; vitamin D 43.99; chol 116; ldl 68; trig 78; hdl 32; tsh 1.238 free t4: 0.79 hgb A1c 7.5 07-30-22: tsh 1.240 free t4: 0.88 t3: 118 08-31-22: tsh 2.255 free t3: 2.8; free t4: 0.82 09-29-22: ACR: 15 01-09-23: wbc 12.7; hgb 7.6; hct 28.5; mcv 75.4 plt 263; glucose 126; bun 13; creat 0.69; k+ 3.5; na++ 141; ca 9.0; gfr>60; protein 6.3 albumin 2.7 chol 99; ldl 46; trig 118; hdl 29; tsh 1.501 01-09-23 (#2); hgb 7.7; hct 28.9  01-10-23: wbc 13.0; hgb 7.7; hct 28.6; mcv 74.5; plt 268; hgb A1c 5.3; iron 18; tibc 374 HIV nr 01-11-23: wbc 11.1; hgb 8.2; hct 29.7 mcv 72.8 plt 272 01-12-23: wbc 12.9; hgb 8.4; hct 31.5; mcv 73.4 plt 291 01-16-23: wbc 12.3; hgb 7.5; hct 28.0; mcv 74.7 plt 286  01-19-23: hgb 8.3; hct 31.8 02-07-23: tsh 1.886 free t3: 2.6; free  t4: 0.89  TODAY  02-20-23: wbc 11.2; hgb 9.4; hct 34.6; mcv 80.7 plt 221; folate 10.1; vitamin B12: 613; iron 14 tibc 367; ferritin 11.  03-22-23: wbc 6.8; hgb 13.0; hct 44.5; mcv 82.7 plt clump; glucose 221; bun 8; creat 0.78; k+ 4.0; na++ 134; ca 8.7; gfr >60; d-dimer <0.27    Review of Systems  Constitutional:  Negative for malaise/fatigue.  Respiratory:  Negative for cough and shortness of breath.   Cardiovascular:  Negative for chest pain, palpitations and leg swelling.  Gastrointestinal:  Negative for abdominal pain, constipation and heartburn.  Musculoskeletal:  Negative for back pain, joint pain and myalgias.  Skin: Negative.   Neurological:  Negative for dizziness.  Psychiatric/Behavioral:  The patient is not nervous/anxious.    Physical Exam Constitutional:      General: He is not in acute distress.    Appearance: He is well-developed. He is not diaphoretic.  Neck:     Thyroid: Thyroid mass and thyromegaly present.  Cardiovascular:     Rate and Rhythm: Normal rate and regular rhythm.     Pulses: Normal pulses.     Heart sounds: Normal heart sounds.  Pulmonary:     Effort: Pulmonary effort is normal. No respiratory distress.     Breath sounds: Normal breath sounds.  Abdominal:     General: Bowel sounds are normal. There is no distension.     Palpations: Abdomen is soft.     Tenderness: There is no abdominal tenderness.  Musculoskeletal:     Cervical back: Neck supple.     Right lower leg: No edema.     Left lower leg: No edema.     Comments:  Left hemiplegia     Lymphadenopathy:     Cervical: No cervical adenopathy.  Skin:    General: Skin is warm and dry.  Neurological:     Mental Status: He is  alert. Mental status is at baseline.     Comments: 12-15-22: SLUMS 13/30  Psychiatric:        Mood and Affect: Mood normal.       ASSESSMENT/ PLAN:  TODAY  COVID 19 with comorbid type 2 diabetes mellitus: will complete molnupiravir and will monitor his status.     Synthia Innocent NP [facility] Adult [facility]  call [phone]

## 2023-03-28 ENCOUNTER — Non-Acute Institutional Stay (SKILLED_NURSING_FACILITY): Payer: Medicaid Other | Admitting: Adult Health

## 2023-03-28 ENCOUNTER — Encounter: Payer: Self-pay | Admitting: Adult Health

## 2023-03-28 DIAGNOSIS — E1169 Type 2 diabetes mellitus with other specified complication: Secondary | ICD-10-CM

## 2023-03-28 DIAGNOSIS — D509 Iron deficiency anemia, unspecified: Secondary | ICD-10-CM

## 2023-03-28 DIAGNOSIS — E43 Unspecified severe protein-calorie malnutrition: Secondary | ICD-10-CM | POA: Diagnosis not present

## 2023-03-28 DIAGNOSIS — E785 Hyperlipidemia, unspecified: Secondary | ICD-10-CM | POA: Diagnosis not present

## 2023-03-28 NOTE — Progress Notes (Unsigned)
Location:  [facility] Room Number: 113 Place of Service:  SNF (31)   CODE STATUS: dnr   Allergies  Allergen Reactions   Cheese    Penicillins Hives    Has patient had a PCN reaction causing immediate rash, facial/tongue/throat swelling, SOB or lightheadedness with hypotension: Unknown Has patient had a PCN reaction causing severe rash involving mucus membranes or skin necrosis: Unknown Has patient had a PCN reaction that required hospitalization: No Has patient had a PCN reaction occurring within the last 10 years: No If all of the above answers are "NO", then may proceed with Cephalosporin use.     Chief Complaint  Patient presents with   Medical Management of Chronic Issues           Hypochromic anemia:    Protein calorie malnutrition severe:     Dyslipidemia associated with type 2 diabetes mellitus     HPI:  He is a 62 year old long term resident of this facility being seen for the management of his chronic illnesses:   Hypochromic anemia:    Protein calorie malnutrition severe:     Dyslipidemia associated with type 2 diabetes mellitus. There are no reports of uncontrolled pain. He has been treated for covid. There have been no complications with the virus and has tolerated antiviral medication.   Past Medical History:  Diagnosis Date   A-fib [facility])    DM (diabetes mellitus) (HCC)    History of seizures    Hypertension    Stroke [facility])     Past Surgical History:  Procedure Laterality Date   CHOLECYSTECTOMY     COLONOSCOPY WITH PROPOFOL N/A 01/11/2023   Procedure: COLONOSCOPY WITH PROPOFOL;  Surgeon: Corbin Ade, MD;  Location: [facility] ENDO [facility];  Service: Endoscopy;  Laterality: N/A;   CRANIECTOMY Right 04/01/2017   Procedure: RIGHT DECOMPRESSIVE CRANIECTOMY;  Surgeon: Ditty, Loura Halt, MD;  Location: [facility] OR;  Service: Neurosurgery;  Laterality: Right;   ESOPHAGOGASTRODUODENOSCOPY N/A 04/13/2017   Procedure: ESOPHAGOGASTRODUODENOSCOPY  (EGD);  Surgeon: Violeta Gelinas, MD;  Location: [facility] ENDOSCOPY;  Service: General;  Laterality: N/A;  bedside   ESOPHAGOGASTRODUODENOSCOPY (EGD) WITH PROPOFOL N/A 01/11/2023   Procedure: ESOPHAGOGASTRODUODENOSCOPY (EGD) WITH PROPOFOL;  Surgeon: Corbin Ade, MD;  [facility];  Service: Endoscopy;  Laterality: N/A;   INCISION AND DRAINAGE PERIRECTAL ABSCESS N/A 09/28/2017   Procedure: IRRIGATION AND DEBRIDEMENT PERIANAL ABSCESS;  Surgeon: Lucretia Roers, MD;  Location: [facility] ORS;  Service: General;  Laterality: N/A;   PEG PLACEMENT N/A 04/13/2017   Procedure: PERCUTANEOUS ENDOSCOPIC GASTROSTOMY (PEG) PLACEMENT;  Surgeon: Violeta Gelinas, MD;  Location: [facility] ENDOSCOPY;  Service: General;  Laterality: N/A;   POLYPECTOMY  01/11/2023   Procedure: POLYPECTOMY INTESTINAL;  Surgeon: Corbin Ade, MD;  Location: [facility]O [facility];  Service: Endoscopy;;    Social History   Socioeconomic History   Marital status: Unknown    Spouse name: Not on file   Number of children: Not on file   Years of education: Not on file   Highest education level: Not on file  Occupational History   Not on file  Tobacco Use   Smoking status: Former    Types: Cigarettes   Smokeless tobacco: Never   Tobacco comments:    UTA  Vaping Use   Vaping status: Never Used  Substance and Sexual Activity   Alcohol use: No    Comment: UTA   Drug use: No    Comment: UTA  Sexual activity: Not Currently    Birth control/protection: None  Other Topics Concern   Not on file  Social History Narrative   Not on file   Social Determinants of Health   Financial Resource Strain: Not on file  Food Insecurity: No Food Insecurity (01/09/2023)   Hunger Vital Sign    Worried About Running Out of Food in the Last Year: Never true    Ran Out of Food in the Last Year: Never true  Transportation Needs: No Transportation Needs (01/09/2023)   PRAPARE - Administrator, Civil Service (Medical): No    Lack of  Transportation (Non-Medical): No  Physical Activity: Not on file  Stress: Not on file  Social Connections: Not on file  Intimate Partner Violence: Not At Risk (01/09/2023)   Humiliation, Afraid, Rape, and Kick questionnaire    Fear of Current or Ex-Partner: No    Emotionally Abused: No    Physically Abused: No    Sexually Abused: No   Family History  Problem Relation Age of Onset   Hypertension Father    Colon cancer Neg Hx    Colon polyps Neg Hx       VITAL SIGNS BP 100/64   Pulse 64   Temp (!) 97.5 F (36.4 C)   Resp 18   Ht 5\' 6"  (1.676 m)   Wt 172 lb 1.6 oz (78.1 kg)   SpO2 96%   BMI 27.78 kg/m   Outpatient Encounter Medications as of 03/28/2023  Medication Sig   acetaminophen (TYLENOL) 325 MG tablet Take 650 mg by mouth every 4 (four) hours as needed. Every 4-6 hours as needed, not to exceed 3 grams in 24 hours   apixaban (ELIQUIS) 5 MG TABS tablet Take 5 mg by mouth 2 (two) times daily.    atorvastatin (LIPITOR) 20 MG tablet Take 20 mg by mouth at bedtime.   Cholecalciferol 1.25 MG (50000 UT) capsule Take 50,000 Units by mouth once a week. For Low Vitamin D Level on Monday   Dextromethorphan-quiNIDine (NUEDEXTA) 20-10 MG capsule Take 1 capsule by mouth 2 (two) times daily.   ferrous sulfate 325 (65 FE) MG EC tablet Take 325 mg by mouth 2 (two) times daily.   gabapentin (NEURONTIN) 100 MG capsule Take 100 mg by mouth at bedtime.   glimepiride (AMARYL) 4 MG tablet Take 4 mg by mouth daily with breakfast.   levETIRAcetam (KEPPRA XR) 500 MG 24 hr tablet Take 500 mg by mouth daily. At bedtime 8 pm   loratadine (CLARITIN) 10 MG tablet Take 10 mg by mouth daily.   LORazepam (ATIVAN) 2 MG/ML injection Inject 1 mg into the vein every 15 (fifteen) minutes as needed for seizure (every 15 mins as needed up to 3 doses for seizure lasting longer than 2 minutes).   Menthol, Topical Analgesic, (BIOFREEZE) 4 % GEL Apply 1 application  topically at bedtime as needed. Apply to left  shoulder   metFORMIN (GLUCOPHAGE-XR) 500 MG 24 hr tablet Take 500 mg by mouth daily with breakfast.   NON FORMULARY Diet:Regular   omeprazole (PRILOSEC) 40 MG capsule Take 1 capsule (40 mg total) by mouth daily.   ondansetron (ZOFRAN-ODT) 8 MG disintegrating tablet Take 8 mg by mouth every 6 (six) hours as needed for nausea or vomiting.   polyethylene glycol (MIRALAX / GLYCOLAX) 17 g packet Take 17 g by mouth daily. 9 pm   QUEtiapine (SEROQUEL) 100 MG tablet Take 100 mg by mouth at bedtime.   QUEtiapine (  SEROQUEL) 25 MG tablet Take 75 mg by mouth daily. At 10 am   Semaglutide (RYBELSUS) 7 MG TABS Take 7 mg by mouth daily at 2 PM.   sennosides-docusate sodium (SENOKOT-S) 8.6-50 MG tablet Take 1 tablet by mouth daily as needed for constipation.   No facility-administered encounter medications on file as of 03/28/2023.     SIGNIFICANT DIAGNOSTIC EXAMS  PREVIOUS   06-10-21: thyroid biopsy   01-11-23: EGD: normal esophagus; stomach, doudenal bulb and 2dn portion of duodenum; peg scar apparent   01-11-23: colonoscopy:  Two 3-5 mm polyp at hepatic flexure and in the cecum; removed with cold snare Diverticulosis in sigmoid colon and in the descending colon, redundant elongated colon Otherwise normal Doubt signs of GI bleed; may restart eliquis.   NO NEW EXAMS    LABS REVIEWED PREVIOUS:     03-14-22; hgb a1c 5.7 03-28-22: glucose 336; bun 7; creat 0.61; k+ 3.6; na++ 137; ca 8.9; gfr>60 06-24-22: wbc 9.2; hgb 10.0; hct 34.9; mcv 87.7 plt 241; glucose 145; bun 8; creat 0.67; k+ 3.6; na++ 140; ca 8.5; gfr >60; protein 6.0; albumin 2.5; vitamin D 43.99; chol 116; ldl 68; trig 78; hdl 32; tsh 1.238 free t4: 0.79 hgb A1c 7.5 07-30-22: tsh 1.240 free t4: 0.88 t3: 118 08-31-22: tsh 2.255 free t3: 2.8; free t4: 0.82 09-29-22: ACR: 15 01-09-23: wbc 12.7; hgb 7.6; hct 28.5; mcv 75.4 plt 263; glucose 126; bun 13; creat 0.69; k+ 3.5; na++ 141; ca 9.0; gfr>60; protein 6.3 albumin 2.7 chol 99; ldl 46; trig 118;  hdl 29; tsh 1.501 01-09-23 (#2); hgb 7.7; hct 28.9  01-10-23: wbc 13.0; hgb 7.7; hct 28.6; mcv 74.5; plt 268; hgb A1c 5.3; iron 18; tibc 374 HIV nr 01-11-23: wbc 11.1; hgb 8.2; hct 29.7 mcv 72.8 plt 272 01-12-23: wbc 12.9; hgb 8.4; hct 31.5; mcv 73.4 plt 291 01-16-23: wbc 12.3; hgb 7.5; hct 28.0; mcv 74.7 plt 286  01-19-23: hgb 8.3; hct 31.8 02-07-23: tsh 1.886 free t3: 2.6; free t4: 0.89  TODAY  02-20-23: wbc 11.2; hgb 9.4; hct 34.6; mcv 80.7 plt 221; folate 10.1; vitamin B12: 613; iron 14 tibc 367; ferritin 11.  03-22-23: wbc 6.8; hgb 13.0; hct 44.5; mcv 82.7 plt clump; glucose 221; bun 8; creat 0.78; k+ 4.0; na++ 134; ca 8.7; gfr >60; d-dimer <0.27   Review of Systems  Constitutional:  Negative for malaise/fatigue.  Respiratory:  Negative for cough and shortness of breath.   Cardiovascular:  Negative for chest pain, palpitations and leg swelling.  Gastrointestinal:  Negative for abdominal pain, constipation and heartburn.  Musculoskeletal:  Negative for back pain, joint pain and myalgias.  Skin: Negative.   Neurological:  Negative for dizziness.  Psychiatric/Behavioral:  The patient is not nervous/anxious.     Physical Exam Constitutional:      General: He is not in acute distress.    Appearance: He is well-developed. He is not diaphoretic.  Neck:     Thyroid: Thyroid mass and thyromegaly present.  Cardiovascular:     Rate and Rhythm: Normal rate and regular rhythm.     Pulses: Normal pulses.     Heart sounds: Normal heart sounds.  Pulmonary:     Effort: Pulmonary effort is normal. No respiratory distress.     Breath sounds: Normal breath sounds.  Abdominal:     General: Bowel sounds are normal. There is no distension.     Palpations: Abdomen is soft.     Tenderness: There is no abdominal tenderness.  Musculoskeletal:     Cervical back: Neck supple.     Right lower leg: No edema.     Left lower leg: No edema.     Comments: Left hemiplegia   Lymphadenopathy:     Cervical: No  cervical adenopathy.  Skin:    General: Skin is warm and dry.  Neurological:     Mental Status: He is alert. Mental status is at baseline.     Comments: 12-15-22: SLUMS 13/30   Psychiatric:        Mood and Affect: Mood normal.       ASSESSMENT/ PLAN:  TODAY;   Hypochromic anemia: hgb 13.0   2. Protein calorie malnutrition severe: albumin 2.5 has declined prostat  3. Dyslipidemia associated with type 2 diabetes mellitus: ldl 68 will continue lipitor 20 mg daily   PREVIOUS   4. Diabetic peripheral neuropathy: will continue gabapentin 100 mg nightly   5. Seizure: no recent activity will continue keppra xl 500 mg daily   6. Chronic constipation: will continue colace twice daily   7. 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 Vitamin D deficiency: 43.99 will is off supplement   9. Hemiplegia affecting left side as late effect of cerebrovascular accident (CVA) is off baclofen  10. GERD without esophagitis: will continue prilosec 40 mg daily   11. Multinodular goiter: is status post aspiration; is due for endocrinology appointment  12. Type 2 diabetes mellitus with neurological complications: hgb A1c 7.5 will continue metformin xr 500 mg daily and rybelsis 7 mg daily   13. Hypertension associated with type 2 diabetes mellitus: b/p 100/64 is off medications  14. Delusions/paranoia will continue seroquel 75 mg in the AM and 100 mg in the PM   15. PAF (paroxsymal atrial fibrillation) heart rate is stable will continue eliquis 5 mg twice daily   4.  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

## 2023-03-29 NOTE — Telephone Encounter (Signed)
PA still pending review.

## 2023-03-31 ENCOUNTER — Encounter: Payer: Self-pay | Admitting: Adult Health

## 2023-03-31 ENCOUNTER — Non-Acute Institutional Stay (SKILLED_NURSING_FACILITY): Payer: Medicaid Other | Admitting: Adult Health

## 2023-03-31 DIAGNOSIS — E1149 Type 2 diabetes mellitus with other diabetic neurological complication: Secondary | ICD-10-CM | POA: Diagnosis not present

## 2023-03-31 DIAGNOSIS — E042 Nontoxic multinodular goiter: Secondary | ICD-10-CM | POA: Diagnosis not present

## 2023-03-31 DIAGNOSIS — R569 Unspecified convulsions: Secondary | ICD-10-CM | POA: Diagnosis not present

## 2023-03-31 NOTE — Progress Notes (Signed)
Location:  [facility] Room Number: 113 Place of Service:  SNF (31)   CODE STATUS: dnr   Allergies  Allergen Reactions   Cheese    Penicillins Hives    Has patient had a PCN reaction causing immediate rash, facial/tongue/throat swelling, SOB or lightheadedness with hypotension: Unknown Has patient had a PCN reaction causing severe rash involving mucus membranes or skin necrosis: Unknown Has patient had a PCN reaction that required hospitalization: No Has patient had a PCN reaction occurring within the last 10 years: No If all of the above answers are "NO", then may proceed with Cephalosporin use.     Chief Complaint  Patient presents with   Acute Visit    Care plan meeting     HPI:  We have come together for his care plan meeting.  BIMS 15/15 mood 0/30. He can be verbally abusive at times due to his delusional state. He is wheelchair bound without falls. He requires supervision to dependent assist with his adls. He is frequently incontinent of bladder and incontinent of bowel. Dietary: regular diet: requires setup for meals; weight is 172 pounds appetite 76-100%. Therapy: none at this time. Activities: does propel self around facility; does attend some activities. He continues to be followed for his chronic illnesses including:   Type 2 diabetes mellitus with neurological complications   Multinodular goiter   Seizures:  Past Medical History:  Diagnosis Date   A-fib (HCC)    DM (diabetes mellitus) (HCC)    History of seizures    Hypertension    Stroke [facility])     Past Surgical History:  Procedure Laterality Date   CHOLECYSTECTOMY     COLONOSCOPY WITH PROPOFOL N/A 01/11/2023   Procedure: COLONOSCOPY WITH PROPOFOL;  Surgeon: Corbin Ade, MD;  Location: [facility]O [facility];  Service: Endoscopy;  Laterality: N/A;   CRANIECTOMY Right 04/01/2017   Procedure: RIGHT DECOMPRESSIVE CRANIECTOMY;  Surgeon: Ditty, Loura Halt, MD;  Location: [facility] OR;  Service:  Neurosurgery;  Laterality: Right;   ESOPHAGOGASTRODUODENOSCOPY N/A 04/13/2017   Procedure: ESOPHAGOGASTRODUODENOSCOPY (EGD);  Surgeon: Violeta Gelinas, MD;  Location: [facility] ENDOSCOPY;  Service: General;  Laterality: N/A;  bedside   ESOPHAGOGASTRODUODENOSCOPY (EGD) WITH PROPOFOL N/A 01/11/2023   Procedure: ESOPHAGOGASTRODUODENOSCOPY (EGD) WITH PROPOFOL;  Surgeon: Corbin Ade, MD;  [facility]: [facility];  Service: Endoscopy;  Laterality: N/A;   INCISION AND DRAINAGE PERIRECTAL ABSCESS N/A 09/28/2017   Procedure: IRRIGATION AND DEBRIDEMENT PERIANAL ABSCESS;  Surgeon: Lucretia Roers, MD;  Location: [facility] ORS;  Service: General;  Laterality: N/A;   PEG PLACEMENT N/A 04/13/2017   Procedure: PERCUTANEOUS ENDOSCOPIC GASTROSTOMY (PEG) PLACEMENT;  Surgeon: Violeta Gelinas, MD;  Location: [facility] ENDOSCOPY;  Service: General;  Laterality: N/A;   POLYPECTOMY  01/11/2023   Procedure: POLYPECTOMY INTESTINAL;  Surgeon: Corbin Ade, MD;  [facility]: [facility];  Service: Endoscopy;;    Social History   Socioeconomic History   Marital status: Unknown    Spouse name: Not on file   Number of children: Not on file   Years of education: Not on file   Highest education level: Not on file  Occupational History   Not on file  Tobacco Use   Smoking status: Former    Types: Cigarettes   Smokeless tobacco: Never   Tobacco comments:    UTA  Vaping Use   Vaping status: Never Used  Substance and Sexual Activity   Alcohol use: No    Comment: UTA   Drug use:  No    Comment: UTA   Sexual activity: Not Currently    Birth control/protection: None  Other Topics Concern   Not on file  Social History Narrative   Not on file   Social Determinants of Health   Financial Resource Strain: Not on file  Food Insecurity: No Food Insecurity (01/09/2023)   Hunger Vital Sign    Worried About Running Out of Food in the Last Year: Never true    Ran Out of Food in the Last Year: Never true  Transportation Needs: No  Transportation Needs (01/09/2023)   PRAPARE - Administrator, Civil Service (Medical): No    Lack of Transportation (Non-Medical): No  Physical Activity: Not on file  Stress: Not on file  Social Connections: Not on file  Intimate Partner Violence: Not At Risk (01/09/2023)   Humiliation, Afraid, Rape, and Kick questionnaire    Fear of Current or Ex-Partner: No    Emotionally Abused: No    Physically Abused: No    Sexually Abused: No   Family History  Problem Relation Age of Onset   Hypertension Father    Colon cancer Neg Hx    Colon polyps Neg Hx       VITAL SIGNS BP (!) 122/58   Pulse 82   Temp 97.6 F (36.4 C)   Resp 18   Ht 5\' 6"  (1.676 m)   Wt 172 lb 1.6 oz (78.1 kg)   SpO2 95%   BMI 27.78 kg/m   Outpatient Encounter Medications as of 03/31/2023  Medication Sig   acetaminophen (TYLENOL) 325 MG tablet Take 650 mg by mouth every 4 (four) hours as needed. Every 4-6 hours as needed, not to exceed 3 grams in 24 hours   apixaban (ELIQUIS) 5 MG TABS tablet Take 5 mg by mouth 2 (two) times daily.    atorvastatin (LIPITOR) 20 MG tablet Take 20 mg by mouth at bedtime.   Cholecalciferol 1.25 MG (50000 UT) capsule Take 50,000 Units by mouth once a week. For Low Vitamin D Level on Monday   Dextromethorphan-quiNIDine (NUEDEXTA) 20-10 MG capsule Take 1 capsule by mouth 2 (two) times daily.   ferrous sulfate 325 (65 FE) MG EC tablet Take 325 mg by mouth 2 (two) times daily.   gabapentin (NEURONTIN) 100 MG capsule Take 100 mg by mouth at bedtime.   glimepiride (AMARYL) 4 MG tablet Take 4 mg by mouth daily with breakfast.   levETIRAcetam (KEPPRA XR) 500 MG 24 hr tablet Take 500 mg by mouth daily. At bedtime 8 pm   loratadine (CLARITIN) 10 MG tablet Take 10 mg by mouth daily.   LORazepam (ATIVAN) 2 MG/ML injection Inject 1 mg into the vein every 15 (fifteen) minutes as needed for seizure (every 15 mins as needed up to 3 doses for seizure lasting longer than 2 minutes).    Menthol, Topical Analgesic, (BIOFREEZE) 4 % GEL Apply 1 application  topically at bedtime as needed. Apply to left shoulder   metFORMIN (GLUCOPHAGE-XR) 500 MG 24 hr tablet Take 500 mg by mouth daily with breakfast.   NON FORMULARY Diet:Regular   omeprazole (PRILOSEC) 40 MG capsule Take 1 capsule (40 mg total) by mouth daily.   ondansetron (ZOFRAN-ODT) 8 MG disintegrating tablet Take 8 mg by mouth every 6 (six) hours as needed for nausea or vomiting.   polyethylene glycol (MIRALAX / GLYCOLAX) 17 g packet Take 17 g by mouth daily. 9 pm   QUEtiapine (SEROQUEL) 100 MG tablet Take 100  mg by mouth at bedtime.   QUEtiapine (SEROQUEL) 25 MG tablet Take 75 mg by mouth daily. At 10 am   Semaglutide (RYBELSUS) 7 MG TABS Take 7 mg by mouth daily at 2 PM.   sennosides-docusate sodium (SENOKOT-S) 8.6-50 MG tablet Take 1 tablet by mouth daily as needed for constipation.   No facility-administered encounter medications on file as of 03/31/2023.     SIGNIFICANT DIAGNOSTIC EXAMS  PREVIOUS   06-10-21: thyroid biopsy   01-11-23: EGD: normal esophagus; stomach, doudenal bulb and 2dn portion of duodenum; peg scar apparent   01-11-23: colonoscopy:  Two 3-5 mm polyp at hepatic flexure and in the cecum; removed with cold snare Diverticulosis in sigmoid colon and in the descending colon, redundant elongated colon Otherwise normal Doubt signs of GI bleed; may restart eliquis.   NO NEW EXAMS    LABS REVIEWED PREVIOUS:     03-14-22; hgb a1c 5.7 03-28-22: glucose 336; bun 7; creat 0.61; k+ 3.6; na++ 137; ca 8.9; gfr>60 06-24-22: wbc 9.2; hgb 10.0; hct 34.9; mcv 87.7 plt 241; glucose 145; bun 8; creat 0.67; k+ 3.6; na++ 140; ca 8.5; gfr >60; protein 6.0; albumin 2.5; vitamin D 43.99; chol 116; ldl 68; trig 78; hdl 32; tsh 1.238 free t4: 0.79 hgb A1c 7.5 07-30-22: tsh 1.240 free t4: 0.88 t3: 118 08-31-22: tsh 2.255 free t3: 2.8; free t4: 0.82 09-29-22: ACR: 15 01-09-23: wbc 12.7; hgb 7.6; hct 28.5; mcv 75.4 plt 263;  glucose 126; bun 13; creat 0.69; k+ 3.5; na++ 141; ca 9.0; gfr>60; protein 6.3 albumin 2.7 chol 99; ldl 46; trig 118; hdl 29; tsh 1.501 01-09-23 (#2); hgb 7.7; hct 28.9  01-10-23: wbc 13.0; hgb 7.7; hct 28.6; mcv 74.5; plt 268; hgb A1c 5.3; iron 18; tibc 374 HIV nr 01-11-23: wbc 11.1; hgb 8.2; hct 29.7 mcv 72.8 plt 272 01-12-23: wbc 12.9; hgb 8.4; hct 31.5; mcv 73.4 plt 291 01-16-23: wbc 12.3; hgb 7.5; hct 28.0; mcv 74.7 plt 286  01-19-23: hgb 8.3; hct 31.8 02-07-23: tsh 1.886 free t3: 2.6; free t4: 0.89 02-20-23: wbc 11.2; hgb 9.4; hct 34.6; mcv 80.7 plt 221; folate 10.1; vitamin B12: 613; iron 14 tibc 367; ferritin 11.  03-22-23: wbc 6.8; hgb 13.0; hct 44.5; mcv 82.7 plt clump; glucose 221; bun 8; creat 0.78; k+ 4.0; na++ 134; ca 8.7; gfr >60; d-dimer <0.27  NO NEW LABS.    Review of Systems  Constitutional:  Negative for malaise/fatigue.  Respiratory:  Negative for cough and shortness of breath.   Cardiovascular:  Negative for chest pain, palpitations and leg swelling.  Gastrointestinal:  Negative for abdominal pain, constipation and heartburn.  Musculoskeletal:  Negative for back pain, joint pain and myalgias.  Skin: Negative.   Neurological:  Negative for dizziness.  Psychiatric/Behavioral:  The patient is not nervous/anxious.     Physical Exam Constitutional:      General: He is not in acute distress.    Appearance: He is well-developed. He is not diaphoretic.  Neck:     Thyroid: Thyroid mass and thyromegaly present.  Cardiovascular:     Rate and Rhythm: Normal rate and regular rhythm.     Pulses: Normal pulses.     Heart sounds: Normal heart sounds.  Pulmonary:     Effort: Pulmonary effort is normal. No respiratory distress.     Breath sounds: Normal breath sounds.  Abdominal:     General: Bowel sounds are normal. There is no distension.     Palpations: Abdomen is soft.  Tenderness: There is no abdominal tenderness.  Musculoskeletal:     Cervical back: Neck supple.     Right  lower leg: No edema.     Left lower leg: No edema.     Comments: Left hemiplegia   Lymphadenopathy:     Cervical: No cervical adenopathy.  Skin:    General: Skin is warm and dry.  Neurological:     Mental Status: He is alert. Mental status is at baseline.  Psychiatric:        Mood and Affect: Mood normal.       ASSESSMENT/ PLAN:  TODAY  Type 2 diabetes mellitus with neurological complications Multinodular goiter Seizures:   Will continue current medications Will continue current plan of care Will continue to monitor his status.   Time spent with patient: 40 minutes: plan of care; activities; dietary; medications.    Synthia Innocent NP [facility]   call [phone]

## 2023-04-03 ENCOUNTER — Other Ambulatory Visit: Payer: Self-pay

## 2023-04-03 DIAGNOSIS — D500 Iron deficiency anemia secondary to blood loss (chronic): Secondary | ICD-10-CM

## 2023-04-04 NOTE — Telephone Encounter (Signed)
PA approved via [employer]. [id number] DOS: 03/29/23-03/27/24

## 2023-04-04 NOTE — Telephone Encounter (Signed)
Called [facility] to schedule. Was transferred to nurse station but line rang numerous times with no answer.

## 2023-04-05 ENCOUNTER — Non-Acute Institutional Stay (SKILLED_NURSING_FACILITY): Payer: Medicaid Other | Admitting: Adult Health

## 2023-04-05 ENCOUNTER — Encounter: Payer: Self-pay | Admitting: Adult Health

## 2023-04-05 DIAGNOSIS — I152 Hypertension secondary to endocrine disorders: Secondary | ICD-10-CM

## 2023-04-05 DIAGNOSIS — E1159 Type 2 diabetes mellitus with other circulatory complications: Secondary | ICD-10-CM

## 2023-04-05 DIAGNOSIS — E1149 Type 2 diabetes mellitus with other diabetic neurological complication: Secondary | ICD-10-CM | POA: Diagnosis not present

## 2023-04-05 NOTE — Progress Notes (Signed)
Location:  [facility] Room Number: 113 D Place of Service:  SNF (31)   CODE STATUS: DNR  Allergies  Allergen Reactions   Cheese    Penicillins Hives    Has patient had a PCN reaction causing immediate rash, facial/tongue/throat swelling, SOB or lightheadedness with hypotension: Unknown Has patient had a PCN reaction causing severe rash involving mucus membranes or skin necrosis: Unknown Has patient had a PCN reaction that required hospitalization: No Has patient had a PCN reaction occurring within the last 10 years: No If all of the above answers are "NO", then may proceed with Cephalosporin use.     Chief Complaint  Patient presents with   Acute Visit    Diabetes    HPI:  He is being seen today for his diabetes. His rybelsus had to be stopped due to insurance coverage. He was started on amaryl 4 mg daily; which is not managing his cbg readings. All of his cbg readings are greater than 200. He will not take any injections for his diabetes. His blood pressure readings are all controlled.   Past Medical History:  Diagnosis Date   A-fib [facility])    DM (diabetes mellitus) (HCC)    History of seizures    Hypertension    Stroke [facility])     Past Surgical History:  Procedure Laterality Date   CHOLECYSTECTOMY     COLONOSCOPY WITH PROPOFOL N/A 01/11/2023   Procedure: COLONOSCOPY WITH PROPOFOL;  Surgeon: Corbin Ade, MD;  [facility];  Service: Endoscopy;  Laterality: N/A;   CRANIECTOMY Right 04/01/2017   Procedure: RIGHT DECOMPRESSIVE CRANIECTOMY;  Surgeon: Ditty, Loura Halt, MD;  Location: [facility] OR;  Service: Neurosurgery;  Laterality: Right;   ESOPHAGOGASTRODUODENOSCOPY N/A 04/13/2017   Procedure: ESOPHAGOGASTRODUODENOSCOPY (EGD);  Surgeon: Violeta Gelinas, MD;  Location: [facility] ENDOSCOPY;  Service: General;  Laterality: N/A;  bedside   ESOPHAGOGASTRODUODENOSCOPY (EGD) WITH PROPOFOL N/A 01/11/2023   Procedure: ESOPHAGOGASTRODUODENOSCOPY (EGD) WITH  PROPOFOL;  Surgeon: Corbin Ade, MD;  [facility];  Service: Endoscopy;  Laterality: N/A;   INCISION AND DRAINAGE PERIRECTAL ABSCESS N/A 09/28/2017   Procedure: IRRIGATION AND DEBRIDEMENT PERIANAL ABSCESS;  Surgeon: Lucretia Roers, MD;  Location: [facility] ORS;  Service: General;  Laterality: N/A;   PEG PLACEMENT N/A 04/13/2017   Procedure: PERCUTANEOUS ENDOSCOPIC GASTROSTOMY (PEG) PLACEMENT;  Surgeon: Violeta Gelinas, MD;  Location: [facility] ENDOSCOPY;  Service: General;  Laterality: N/A;   POLYPECTOMY  01/11/2023   Procedure: POLYPECTOMY INTESTINAL;  Surgeon: Corbin Ade, MD;  [facility];  Service: Endoscopy;;    Social History   Socioeconomic History   Marital status: Unknown    Spouse name: Not on file   Number of children: Not on file   Years of education: Not on file   Highest education level: Not on file  Occupational History   Not on file  Tobacco Use   Smoking status: Former    Types: Cigarettes   Smokeless tobacco: Never   Tobacco comments:    UTA  Vaping Use   Vaping status: Never Used  Substance and Sexual Activity   Alcohol use: No    Comment: UTA   Drug use: No    Comment: UTA   Sexual activity: Not Currently    Birth control/protection: None  Other Topics Concern   Not on file  Social History Narrative   Not on file   Social Determinants of Health   Financial Resource Strain: Not on file  Food Insecurity: No Food Insecurity (01/09/2023)   Hunger Vital Sign    Worried About Running Out of Food in the Last Year: Never true    Ran Out of Food in the Last Year: Never true  Transportation Needs: No Transportation Needs (01/09/2023)   PRAPARE - Administrator, Civil Service (Medical): No    Lack of Transportation (Non-Medical): No  Physical Activity: Not on file  Stress: Not on file  Social Connections: Not on file  Intimate Partner Violence: Not At Risk (01/09/2023)   Humiliation, Afraid, Rape, and Kick questionnaire    Fear  of Current or Ex-Partner: No    Emotionally Abused: No    Physically Abused: No    Sexually Abused: No   Family History  Problem Relation Age of Onset   Hypertension Father    Colon cancer Neg Hx    Colon polyps Neg Hx       VITAL SIGNS BP 116/70   Pulse 68   Temp 98.1 F (36.7 C)   Ht 5\' 6"  (1.676 m)   Wt 172 lb (78 kg)   SpO2 95%   BMI 27.76 kg/m   Outpatient Encounter Medications as of 04/05/2023  Medication Sig   acetaminophen (TYLENOL) 325 MG tablet Take 650 mg by mouth every 4 (four) hours as needed. Every 4-6 hours as needed, not to exceed 3 grams in 24 hours   apixaban (ELIQUIS) 5 MG TABS tablet Take 5 mg by mouth 2 (two) times daily.    atorvastatin (LIPITOR) 20 MG tablet Take 20 mg by mouth at bedtime.   Cholecalciferol 1.25 MG (50000 UT) capsule Take 50,000 Units by mouth once a week. For Low Vitamin D Level on Monday   Dextromethorphan-quiNIDine (NUEDEXTA) 20-10 MG capsule Take 1 capsule by mouth 2 (two) times daily.   ferrous sulfate 325 (65 FE) MG EC tablet Take 325 mg by mouth 2 (two) times daily.   gabapentin (NEURONTIN) 100 MG capsule Take 100 mg by mouth at bedtime.   levETIRAcetam (KEPPRA XR) 500 MG 24 hr tablet Take 500 mg by mouth daily. At bedtime 8 pm   loratadine (CLARITIN) 10 MG tablet Take 10 mg by mouth daily.   LORazepam (ATIVAN) 2 MG/ML injection Inject 1 mg into the vein every 15 (fifteen) minutes as needed for seizure (every 15 mins as needed up to 3 doses for seizure lasting longer than 2 minutes).   Menthol, Topical Analgesic, (BIOFREEZE) 4 % GEL Apply 1 application  topically at bedtime as needed. Apply to left shoulder   metFORMIN (GLUCOPHAGE-XR) 500 MG 24 hr tablet Take 500 mg by mouth daily with breakfast.   NON FORMULARY Diet:Regular   omeprazole (PRILOSEC) 40 MG capsule Take 1 capsule (40 mg total) by mouth daily.   ondansetron (ZOFRAN-ODT) 8 MG disintegrating tablet Take 8 mg by mouth every 6 (six) hours as needed for nausea or vomiting.    polyethylene glycol (MIRALAX / GLYCOLAX) 17 g packet Take 17 g by mouth daily. 9 pm   QUEtiapine (SEROQUEL) 100 MG tablet Take 100 mg by mouth at bedtime.   QUEtiapine (SEROQUEL) 25 MG tablet Take 75 mg by mouth daily. At 10 am   sennosides-docusate sodium (SENOKOT-S) 8.6-50 MG tablet Take 1 tablet by mouth daily as needed for constipation.   [DISCONTINUED] glimepiride (AMARYL) 4 MG tablet Take 4 mg by mouth daily with breakfast.   [DISCONTINUED] Semaglutide (RYBELSUS) 7 MG TABS Take 7 mg by mouth daily at 2 PM.  No facility-administered encounter medications on file as of 04/05/2023.     SIGNIFICANT DIAGNOSTIC EXAMS  PREVIOUS   06-10-21: thyroid biopsy   01-11-23: EGD: normal esophagus; stomach, doudenal bulb and 2dn portion of duodenum; peg scar apparent   01-11-23: colonoscopy:  Two 3-5 mm polyp at hepatic flexure and in the cecum; removed with cold snare Diverticulosis in sigmoid colon and in the descending colon, redundant elongated colon Otherwise normal Doubt signs of GI bleed; may restart eliquis.   NO NEW EXAMS    LABS REVIEWED PREVIOUS:     03-14-22; hgb a1c 5.7 03-28-22: glucose 336; bun 7; creat 0.61; k+ 3.6; na++ 137; ca 8.9; gfr>60 06-24-22: wbc 9.2; hgb 10.0; hct 34.9; mcv 87.7 plt 241; glucose 145; bun 8; creat 0.67; k+ 3.6; na++ 140; ca 8.5; gfr >60; protein 6.0; albumin 2.5; vitamin D 43.99; chol 116; ldl 68; trig 78; hdl 32; tsh 1.238 free t4: 0.79 hgb A1c 7.5 07-30-22: tsh 1.240 free t4: 0.88 t3: 118 08-31-22: tsh 2.255 free t3: 2.8; free t4: 0.82 09-29-22: ACR: 15 01-09-23: wbc 12.7; hgb 7.6; hct 28.5; mcv 75.4 plt 263; glucose 126; bun 13; creat 0.69; k+ 3.5; na++ 141; ca 9.0; gfr>60; protein 6.3 albumin 2.7 chol 99; ldl 46; trig 118; hdl 29; tsh 1.501 01-09-23 (#2); hgb 7.7; hct 28.9  01-10-23: wbc 13.0; hgb 7.7; hct 28.6; mcv 74.5; plt 268; hgb A1c 5.3; iron 18; tibc 374 HIV nr 01-11-23: wbc 11.1; hgb 8.2; hct 29.7 mcv 72.8 plt 272 01-12-23: wbc 12.9; hgb 8.4; hct  31.5; mcv 73.4 plt 291 01-16-23: wbc 12.3; hgb 7.5; hct 28.0; mcv 74.7 plt 286  01-19-23: hgb 8.3; hct 31.8 02-07-23: tsh 1.886 free t3: 2.6; free t4: 0.89 02-20-23: wbc 11.2; hgb 9.4; hct 34.6; mcv 80.7 plt 221; folate 10.1; vitamin B12: 613; iron 14 tibc 367; ferritin 11.  03-22-23: wbc 6.8; hgb 13.0; hct 44.5; mcv 82.7 plt clump; glucose 221; bun 8; creat 0.78; k+ 4.0; na++ 134; ca 8.7; gfr >60; d-dimer <0.27  NO NEW LABS.    Review of Systems  Constitutional:  Negative for malaise/fatigue.  Respiratory:  Negative for cough and shortness of breath.   Cardiovascular:  Negative for chest pain, palpitations and leg swelling.  Gastrointestinal:  Negative for abdominal pain, constipation and heartburn.  Musculoskeletal:  Negative for back pain, joint pain and myalgias.  Skin: Negative.   Neurological:  Negative for dizziness.  Psychiatric/Behavioral:  The patient is not nervous/anxious.     Physical Exam Constitutional:      General: He is not in acute distress.    Appearance: He is well-developed. He is not diaphoretic.  Neck:     Thyroid: Thyroid mass and thyromegaly present.  Cardiovascular:     Rate and Rhythm: Normal rate and regular rhythm.     Pulses: Normal pulses.     Heart sounds: Normal heart sounds.  Pulmonary:     Effort: Pulmonary effort is normal. No respiratory distress.     Breath sounds: Normal breath sounds.  Abdominal:     General: Bowel sounds are normal. There is no distension.     Palpations: Abdomen is soft.     Tenderness: There is no abdominal tenderness.  Musculoskeletal:     Cervical back: Neck supple.     Right lower leg: No edema.     Left lower leg: No edema.     Comments: Left hemiplegia   Lymphadenopathy:     Cervical: No cervical adenopathy.  Skin:  General: Skin is warm and dry.  Neurological:     Mental Status: He is alert. Mental status is at baseline.     Comments: 03-24-23: SLUMS 17/30  Psychiatric:        Mood and Affect: Mood normal.        ASSESSMENT/ PLAN:  TODAY  Hypertension associated with type 2 diabetes mellitus: is well managed will monitor  Type 2 diabetes mellitus with neurological complications: will stop amaryl as this medication is not effective; will begin farxiga 5 mg daily; will continue metformin xr 500 mg daily    Synthia Innocent NP [facility] Adult [facility]   call [phone]

## 2023-04-05 NOTE — Telephone Encounter (Signed)
Spoke with Kathie Rhodes at [place] [facility]. She advised me to fax info to [phone].

## 2023-04-12 ENCOUNTER — Other Ambulatory Visit (HOSPITAL_COMMUNITY)
Admission: RE | Admit: 2023-04-12 | Discharge: 2023-04-12 | Disposition: A | Discharge status: Home / Self Care | Payer: Medicaid Other | Source: Skilled Nursing Facility | Attending: Internal Medicine | Admitting: Internal Medicine

## 2023-04-12 DIAGNOSIS — I69354 Hemiplegia and hemiparesis following cerebral infarction affecting left non-dominant side: Secondary | ICD-10-CM | POA: Insufficient documentation

## 2023-04-12 DIAGNOSIS — E1149 Type 2 diabetes mellitus with other diabetic neurological complication: Secondary | ICD-10-CM | POA: Insufficient documentation

## 2023-04-12 LAB — CBC WITH DIFFERENTIAL/PLATELET
Abs Immature Granulocytes: 0.04 10*3/uL (ref 0.00–0.07)
Basophils Absolute: 0.1 10*3/uL (ref 0.0–0.1)
Basophils Relative: 1 %
Eosinophils Absolute: 0.4 10*3/uL (ref 0.0–0.5)
Eosinophils Relative: 4 %
HCT: 45.2 % (ref 39.0–52.0)
Hemoglobin: 13 g/dL (ref 13.0–17.0)
Immature Granulocytes: 0 %
Lymphocytes Relative: 31 %
Lymphs Abs: 3 10*3/uL (ref 0.7–4.0)
MCH: 24.9 pg — ABNORMAL LOW (ref 26.0–34.0)
MCHC: 28.8 g/dL — ABNORMAL LOW (ref 30.0–36.0)
MCV: 86.6 fL (ref 80.0–100.0)
Monocytes Absolute: 0.8 10*3/uL (ref 0.1–1.0)
Monocytes Relative: 8 %
Neutro Abs: 5.4 10*3/uL (ref 1.7–7.7)
Neutrophils Relative %: 56 %
Platelets: 184 10*3/uL (ref 150–400)
RBC: 5.22 MIL/uL (ref 4.22–5.81)
RDW: 18 % — ABNORMAL HIGH (ref 11.5–15.5)
WBC: 9.8 10*3/uL (ref 4.0–10.5)
nRBC: 0 % (ref 0.0–0.2)

## 2023-04-12 LAB — FERRITIN: Ferritin: 20 ng/mL — ABNORMAL LOW (ref 24–336)

## 2023-04-14 ENCOUNTER — Encounter: Payer: Self-pay | Admitting: Internal Medicine

## 2023-04-14 ENCOUNTER — Non-Acute Institutional Stay (SKILLED_NURSING_FACILITY): Payer: Self-pay | Admitting: Internal Medicine

## 2023-04-14 DIAGNOSIS — E042 Nontoxic multinodular goiter: Secondary | ICD-10-CM | POA: Diagnosis not present

## 2023-04-14 DIAGNOSIS — I480 Paroxysmal atrial fibrillation: Secondary | ICD-10-CM | POA: Diagnosis not present

## 2023-04-14 DIAGNOSIS — D509 Iron deficiency anemia, unspecified: Secondary | ICD-10-CM | POA: Diagnosis not present

## 2023-04-14 DIAGNOSIS — E1149 Type 2 diabetes mellitus with other diabetic neurological complication: Secondary | ICD-10-CM | POA: Diagnosis not present

## 2023-04-14 NOTE — Patient Instructions (Signed)
See assessment and plan under each diagnosis in the problem list and acutely for this visit 

## 2023-04-14 NOTE — Assessment & Plan Note (Signed)
Current TSH is therapeutic with a value of 1.886.  He declines follow-up with Dr. Fransico Him.

## 2023-04-14 NOTE — Assessment & Plan Note (Signed)
Ferritin has improved and the anemia has resolved.  Givens capsule study 9/16 to assess the etiology of the GI bleed.  At this time he denies any bleeding dyscrasias.

## 2023-04-14 NOTE — Assessment & Plan Note (Addendum)
There is a discordance between the glucoses and his A1c.  This is in the absence significant CKD as his GFR is greater than 60.  There are multiple sweet snacks at bedside. Glucose checks are ac evening meal as some issues with am staff & patient in reference to adherence to meds & DM monitor. Check FBS to better assess DM status.

## 2023-04-14 NOTE — Progress Notes (Unsigned)
NURSING HOME LOCATION:  [facility] ROOM NUMBER:  [id number]  CODE STATUS: Full code  PCP:  Synthia Innocent NP  This is a nursing facility follow up visit of chronic medical diagnoses & to document compliance with Regulation 483.30 (c) in The Long Term Care Survey Manual Phase 2 which mandates caregiver visit ( visits can alternate among physician, PA or NP as per statutes) within 10 days of 30 days / 60 days/ 90 days post admission to SNF date    Interim medical record and care since last SNF visit was updated with review of diagnostic studies and change in clinical status since last visit were documented.  HPI: He is a permanent resident of this facility with medical diagnoses of atrial fibrillation, history of seizures, essential hypertension, history of stroke, history of colon polyps,mood disorder, and diabetes with peripheral vascular disease.  Labs are current and reveal mild hypocalcemia with a value of 8.7 and CKD stage II.  On 7/16 H/H was 9.4/34.6 but anemia resolved with current values of 13.0/45.2.  Despite resolution of anemia hypochromic indices persists with an MCHC of 28.8. Iron level was 14 on 7/16.  Ferritin has improved from a value of 11 up to a value of 20.  Normal is 24-336. Givens capsule procedure is tentatively planned for 9/16. A1c has been nondiabetic at 5.3% despite documented hyperglycemia.  Staff reports that at times he has been nonadherent with the diabetic medications. He was previously on Rybelsus but this was discontinued due to lack of insurance coverage.  Amaryl 4 mg daily was initiated.  On Amaryl all glucose readings were consistently greater than 200.  The patient has refused any injections for diabetes.  Amaryl was discontinued & Farxiga 5 mg daily added to maintenance metformin. Current TSH is therapeutic with a value of 1.886.  Review of systems: He is aware of the GI procedure scheduled 9/16 and understands its purpose.  He denies any  bleeding dyscrasias.  His major complaint is dry mouth "now and then."  He denies associated xero-ophthalmia,dysphagia, dyspepsia, or abdominal pain.  He states that his sugars are "116 sometimes but not over that."  He states he drinks "a pitcher of water a day" because of the mouth dryness.  Constitutional: No fever, significant weight change, fatigue  Eyes: No redness, discharge, pain, vision change ENT/mouth: No nasal congestion,  purulent discharge, earache, change in hearing, sore throat  Cardiovascular: No chest pain, palpitations, paroxysmal nocturnal dyspnea, edema  Respiratory: No cough, sputum production, hemoptysis, DOE, significant snoring, apnea   Gastrointestinal: No heartburn, dysphagia, abdominal pain, nausea /vomiting, rectal bleeding, melena, change in bowels Genitourinary: No dysuria, hematuria, pyuria, incontinence, nocturia Musculoskeletal: No joint stiffness, joint swelling, pain Dermatologic: No rash, pruritus, change in appearance of skin Neurologic: No dizziness, headache, syncope, seizures Psychiatric: No significant anxiety, depression, insomnia, anorexia Endocrine: No change in hair/skin/nails, excessive hunger, excessive urination  Hematologic/lymphatic: No significant bruising, lymphadenopathy, abnormal bleeding Allergy/immunology: No itchy/watery eyes, significant sneezing, urticaria, angioedema  Physical exam:  Pertinent or positive findings: He was in the halls mobilizing in wheelchair by using his right upper and lower extremities and directing the wheelchair backwards because of the left hemiparesis.  Upon return to his room for exam, multiple sweet snacks were noted on the bedside table. Hair is grossly cropped & pattern alopecia is present.  There is a scar over the right posterior crown.  He has a close cropped mustache and beard.  The left eye intermittently exhibits a wide-eyed  stare appearance.  He is edentulous. His speech pattern is hyponasal with  somewhat of a childish cadence.  There is a large goiter visible over the right anterior neck.  Heart rhythm is irregular.  Breath sounds are decreased.  Pulses are not palpable.  As noted left hemiparesis is present.  General appearance: Adequately nourished; no acute distress, increased work of breathing is present.   Lymphatic: No lymphadenopathy about the head, neck, axilla. Eyes: No conjunctival inflammation or lid edema is present. There is no scleral icterus. Ears:  External ear exam shows no significant lesions or deformities.   Nose:  External nasal examination shows no deformity or inflammation. Nasal mucosa are pink and moist without lesions, exudates Neck:  No tenderness noted.    Heart:  No gallop, murmur, click, rub .  Lungs: without wheezes, rhonchi, rales, rubs. Abdomen: Bowel sounds are normal. Abdomen is soft and nontender with no organomegaly, hernias, masses. GU: Deferred  Extremities:  No cyanosis, clubbing, edema  Neurologic exam :Balance, Rhomberg, finger to nose testing could not be completed due to clinical state Skin: Warm & dry w/o tenting. No significant lesions or rash.  See summary under each active problem in the Problem List with associated updated therapeutic plan

## 2023-04-15 NOTE — Assessment & Plan Note (Addendum)
Rhythm slightly irregular with well controlled rate w/o BB or CCB. Continue DOAC.

## 2023-04-17 ENCOUNTER — Other Ambulatory Visit (HOSPITAL_COMMUNITY)
Admission: RE | Admit: 2023-04-17 | Discharge: 2023-04-17 | Disposition: A | Discharge status: Home / Self Care | Payer: Medicaid Other | Source: Skilled Nursing Facility | Attending: Adult Health | Admitting: Adult Health

## 2023-04-17 DIAGNOSIS — E1159 Type 2 diabetes mellitus with other circulatory complications: Secondary | ICD-10-CM | POA: Diagnosis present

## 2023-04-17 LAB — HEMOGLOBIN A1C
Hgb A1c MFr Bld: 7.7 % — ABNORMAL HIGH (ref 4.8–5.6)
Mean Plasma Glucose: 174.29 mg/dL

## 2023-04-18 ENCOUNTER — Non-Acute Institutional Stay (SKILLED_NURSING_FACILITY): Payer: Medicaid Other | Admitting: Adult Health

## 2023-04-18 ENCOUNTER — Encounter: Payer: Self-pay | Admitting: Adult Health

## 2023-04-18 DIAGNOSIS — E1149 Type 2 diabetes mellitus with other diabetic neurological complication: Secondary | ICD-10-CM | POA: Diagnosis not present

## 2023-04-18 NOTE — Progress Notes (Unsigned)
Location:  [facility] Room Number: 113 Place of Service:  SNF (31)   CODE STATUS: dnr   Allergies  Allergen Reactions   Cheese    Penicillins Hives    Has patient had a PCN reaction causing immediate rash, facial/tongue/throat swelling, SOB or lightheadedness with hypotension: Unknown Has patient had a PCN reaction causing severe rash involving mucus membranes or skin necrosis: Unknown Has patient had a PCN reaction that required hospitalization: No Has patient had a PCN reaction occurring within the last 10 years: No If all of the above answers are "NO", then may proceed with Cephalosporin use.     Chief Complaint  Patient presents with   Acute Visit    Diabetes     HPI:  His hgb A1c is 7.7.  his cbg readings are all over 200.   He is presently taking rybelsus 14 mg daily; metformin xr 500 mg daily and farxiga 5 mg daily. He will not allow for any injectable medications. There are no reports of excessive hunger or thirst.   Past Medical History:  Diagnosis Date   A-fib (HCC)    DM (diabetes mellitus) (HCC)    History of seizures    Hypertension    Stroke [place])     Past Surgical History:  Procedure Laterality Date   CHOLECYSTECTOMY     COLONOSCOPY WITH PROPOFOL N/A 01/11/2023   Procedure: COLONOSCOPY WITH PROPOFOL;  Surgeon: Corbin Ade, MD;  Location: [facility];  Service: Endoscopy;  Laterality: N/A;   CRANIECTOMY Right 04/01/2017   Procedure: RIGHT DECOMPRESSIVE CRANIECTOMY;  Surgeon: Ditty, Loura Halt, MD;  Location: [facility] OR;  Service: Neurosurgery;  Laterality: Right;   ESOPHAGOGASTRODUODENOSCOPY N/A 04/13/2017   Procedure: ESOPHAGOGASTRODUODENOSCOPY (EGD);  Surgeon: Violeta Gelinas, MD;  Location: [facility] ENDOSCOPY;  Service: General;  Laterality: N/A;  bedside   ESOPHAGOGASTRODUODENOSCOPY (EGD) WITH PROPOFOL N/A 01/11/2023   Procedure: ESOPHAGOGASTRODUODENOSCOPY (EGD) WITH PROPOFOL;  Surgeon: Corbin Ade, MD;  Location: [facility];  Service: Endoscopy;  Laterality: N/A;   INCISION AND DRAINAGE PERIRECTAL ABSCESS N/A 09/28/2017   Procedure: IRRIGATION AND DEBRIDEMENT PERIANAL ABSCESS;  Surgeon: Lucretia Roers, MD;  Location: [facility] ORS;  Service: General;  Laterality: N/A;   PEG PLACEMENT N/A 04/13/2017   Procedure: PERCUTANEOUS ENDOSCOPIC GASTROSTOMY (PEG) PLACEMENT;  Surgeon: Violeta Gelinas, MD;  Location: [facility] ENDOSCOPY;  Service: General;  Laterality: N/A;   POLYPECTOMY  01/11/2023   Procedure: POLYPECTOMY INTESTINAL;  Surgeon: Corbin Ade, MD;  Location: [facility];  Service: Endoscopy;;    Social History   Socioeconomic History   Marital status: Unknown    Spouse name: Not on file   Number of children: Not on file   Years of education: Not on file   Highest education level: Not on file  Occupational History   Not on file  Tobacco Use   Smoking status: Former    Types: Cigarettes   Smokeless tobacco: Never   Tobacco comments:    UTA  Vaping Use   Vaping status: Never Used  Substance and Sexual Activity   Alcohol use: No    Comment: UTA   Drug use: No    Comment: UTA   Sexual activity: Not Currently    Birth control/protection: None  Other Topics Concern   Not on file  Social History Narrative   Not on file   Social Determinants of Health   Financial Resource Strain: Not on file  Food Insecurity: No Food Insecurity (  01/09/2023)   Hunger Vital Sign    Worried About Running Out of Food in the Last Year: Never true    Ran Out of Food in the Last Year: Never true  Transportation Needs: No Transportation Needs (01/09/2023)   PRAPARE - Administrator, Civil Service (Medical): No    Lack of Transportation (Non-Medical): No  Physical Activity: Not on file  Stress: Not on file  Social Connections: Not on file  Intimate Partner Violence: Not At Risk (01/09/2023)   Humiliation, Afraid, Rape, and Kick questionnaire    Fear of Current or Ex-Partner: No    Emotionally Abused: No     Physically Abused: No    Sexually Abused: No   Family History  Problem Relation Age of Onset   Hypertension Father    Colon cancer Neg Hx    Colon polyps Neg Hx       VITAL SIGNS BP 118/78   Pulse 69   Temp 97.6 F (36.4 C)   Resp 18   Ht 5\' 6"  (1.676 m)   Wt 174 lb 8 oz (79.2 kg)   SpO2 93%   BMI 28.17 kg/m   Outpatient Encounter Medications as of 04/18/2023  Medication Sig   acetaminophen (TYLENOL) 325 MG tablet Take 650 mg by mouth every 4 (four) hours as needed. Every 4-6 hours as needed, not to exceed 3 grams in 24 hours   apixaban (ELIQUIS) 5 MG TABS tablet Take 5 mg by mouth 2 (two) times daily.    atorvastatin (LIPITOR) 20 MG tablet Take 20 mg by mouth at bedtime.   Cholecalciferol 1.25 MG (50000 UT) capsule Take 50,000 Units by mouth once a week. For Low Vitamin D Level on Monday   Dextromethorphan-quiNIDine (NUEDEXTA) 20-10 MG capsule Take 1 capsule by mouth 2 (two) times daily.   ferrous sulfate 325 (65 FE) MG EC tablet Take 325 mg by mouth 2 (two) times daily.   gabapentin (NEURONTIN) 100 MG capsule Take 100 mg by mouth at bedtime.   levETIRAcetam (KEPPRA XR) 500 MG 24 hr tablet Take 500 mg by mouth daily. At bedtime 8 pm   loratadine (CLARITIN) 10 MG tablet Take 10 mg by mouth daily.   LORazepam (ATIVAN) 2 MG/ML injection Inject 1 mg into the vein every 15 (fifteen) minutes as needed for seizure (every 15 mins as needed up to 3 doses for seizure lasting longer than 2 minutes).   Menthol, Topical Analgesic, (BIOFREEZE) 4 % GEL Apply 1 application  topically at bedtime as needed. Apply to left shoulder   metFORMIN (GLUCOPHAGE-XR) 500 MG 24 hr tablet Take 500 mg by mouth daily with breakfast.   NON FORMULARY Diet:Regular   omeprazole (PRILOSEC) 40 MG capsule Take 1 capsule (40 mg total) by mouth daily.   ondansetron (ZOFRAN-ODT) 8 MG disintegrating tablet Take 8 mg by mouth every 6 (six) hours as needed for nausea or vomiting.   polyethylene glycol (MIRALAX /  GLYCOLAX) 17 g packet Take 17 g by mouth daily. 9 pm   QUEtiapine (SEROQUEL) 100 MG tablet Take 100 mg by mouth at bedtime.   QUEtiapine (SEROQUEL) 25 MG tablet Take 75 mg by mouth daily. At 10 am   sennosides-docusate sodium (SENOKOT-S) 8.6-50 MG tablet Take 1 tablet by mouth daily as needed for constipation.   No facility-administered encounter medications on file as of 04/18/2023.     SIGNIFICANT DIAGNOSTIC EXAMS  PREVIOUS   06-10-21: thyroid biopsy   01-11-23: EGD: normal esophagus; stomach, doudenal  bulb and 2dn portion of duodenum; peg scar apparent   01-11-23: colonoscopy:  Two 3-5 mm polyp at hepatic flexure and in the cecum; removed with cold snare Diverticulosis in sigmoid colon and in the descending colon, redundant elongated colon Otherwise normal Doubt signs of GI bleed; may restart eliquis.   NO NEW EXAMS    LABS REVIEWED PREVIOUS:     06-24-22: wbc 9.2; hgb 10.0; hct 34.9; mcv 87.7 plt 241; glucose 145; bun 8; creat 0.67; k+ 3.6; na++ 140; ca 8.5; gfr >60; protein 6.0; albumin 2.5; vitamin D 43.99; chol 116; ldl 68; trig 78; hdl 32; tsh 1.238 free t4: 0.79 hgb A1c 7.5 07-30-22: tsh 1.240 free t4: 0.88 t3: 118 08-31-22: tsh 2.255 free t3: 2.8; free t4: 0.82 09-29-22: ACR: 15 01-09-23: wbc 12.7; hgb 7.6; hct 28.5; mcv 75.4 plt 263; glucose 126; bun 13; creat 0.69; k+ 3.5; na++ 141; ca 9.0; gfr>60; protein 6.3 albumin 2.7 chol 99; ldl 46; trig 118; hdl 29; tsh 1.501 01-09-23 (#2); hgb 7.7; hct 28.9  01-10-23: wbc 13.0; hgb 7.7; hct 28.6; mcv 74.5; plt 268; hgb A1c 5.3; iron 18; tibc 374 HIV nr 01-11-23: wbc 11.1; hgb 8.2; hct 29.7 mcv 72.8 plt 272 01-12-23: wbc 12.9; hgb 8.4; hct 31.5; mcv 73.4 plt 291 01-16-23: wbc 12.3; hgb 7.5; hct 28.0; mcv 74.7 plt 286  01-19-23: hgb 8.3; hct 31.8 02-07-23: tsh 1.886 free t3: 2.6; free t4: 0.89 02-20-23: wbc 11.2; hgb 9.4; hct 34.6; mcv 80.7 plt 221; folate 10.1; vitamin B12: 613; iron 14 tibc 367; ferritin 11.  03-22-23: wbc 6.8; hgb 13.0; hct  44.5; mcv 82.7 plt clump; glucose 221; bun 8; creat 0.78; k+ 4.0; na++ 134; ca 8.7; gfr >60; d-dimer <0.27  TODAY  04-17-23 hgb A1c 7.7   Review of Systems  Constitutional:  Negative for malaise/fatigue.  Respiratory:  Negative for cough and shortness of breath.   Cardiovascular:  Negative for chest pain, palpitations and leg swelling.  Gastrointestinal:  Negative for abdominal pain, constipation and heartburn.  Musculoskeletal:  Negative for back pain, joint pain and myalgias.  Skin: Negative.   Neurological:  Negative for dizziness.  Psychiatric/Behavioral:  The patient is not nervous/anxious.    Physical Exam Constitutional:      General: He is not in acute distress.    Appearance: He is well-developed. He is not diaphoretic.  Neck:     Thyroid: No thyromegaly.  Cardiovascular:     Rate and Rhythm: Normal rate and regular rhythm.     Pulses: Normal pulses.     Heart sounds: Normal heart sounds.  Pulmonary:     Effort: Pulmonary effort is normal. No respiratory distress.     Breath sounds: Normal breath sounds.  Abdominal:     General: Bowel sounds are normal. There is no distension.     Palpations: Abdomen is soft.     Tenderness: There is no abdominal tenderness.  Musculoskeletal:     Cervical back: Neck supple.     Right lower leg: No edema.     Left lower leg: No edema.     Comments: Left hemiplegia    Lymphadenopathy:     Cervical: No cervical adenopathy.  Skin:    General: Skin is warm and dry.  Neurological:     Mental Status: He is alert. Mental status is at baseline.     Comments:  03-24-23: SLUMS 17/30  Psychiatric:        Mood and Affect: Mood normal.  ASSESSMENT/ PLAN:  TODAY  Type 2 diabetes mellitus with neurological complications: hgb A1c 7.7; will increase farxgia to 10 mg daily    Synthia Innocent NP [facility]  call [phone]

## 2023-04-24 ENCOUNTER — Telehealth: Payer: Self-pay | Admitting: *Deleted

## 2023-04-24 NOTE — Telephone Encounter (Signed)
Received message that patient was supposed to have givens today but [facility] never stopped his Iron and last dose was last night. GIVENS for today cancelled.

## 2023-04-25 NOTE — Telephone Encounter (Signed)
Called [facility] and spoke with Toniann Fail. She rescheduled patient procedure to 10/7. Advised to fax instructions to [phone]. I have done so and received confirmation

## 2023-05-03 ENCOUNTER — Other Ambulatory Visit (HOSPITAL_COMMUNITY)
Admission: RE | Admit: 2023-05-03 | Discharge: 2023-05-03 | Disposition: A | Discharge status: Home / Self Care | Payer: Medicaid Other | Source: Skilled Nursing Facility | Attending: Adult Health | Admitting: Adult Health

## 2023-05-03 DIAGNOSIS — E069 Thyroiditis, unspecified: Secondary | ICD-10-CM | POA: Diagnosis present

## 2023-05-03 LAB — T4, FREE: Free T4: 0.73 ng/dL (ref 0.61–1.12)

## 2023-05-03 LAB — TSH: TSH: 1.484 u[IU]/mL (ref 0.350–4.500)

## 2023-05-04 ENCOUNTER — Ambulatory Visit (INDEPENDENT_AMBULATORY_CARE_PROVIDER_SITE_OTHER): Payer: Medicaid Other | Admitting: "Endocrinology

## 2023-05-04 ENCOUNTER — Encounter: Payer: Self-pay | Admitting: "Endocrinology

## 2023-05-04 VITALS — BP 124/78 | HR 68

## 2023-05-04 DIAGNOSIS — E042 Nontoxic multinodular goiter: Secondary | ICD-10-CM | POA: Diagnosis not present

## 2023-05-04 LAB — T3, FREE: T3, Free: 2.6 pg/mL (ref 2.0–4.4)

## 2023-05-04 NOTE — Progress Notes (Signed)
05/04/2023, 3:51 PM  Endocrinology follow-up note  Subjective:    Patient ID: Joe Wilkinson, male    DOB: 1961-06-06, PCP Sharee Holster, NP   Past Medical History:  Diagnosis Date   A-fib [facility])    DM (diabetes mellitus) (HCC)    History of seizures    Hypertension    Stroke [facility])    Past Surgical History:  Procedure Laterality Date   CHOLECYSTECTOMY     COLONOSCOPY WITH PROPOFOL N/A 01/11/2023   Procedure: COLONOSCOPY WITH PROPOFOL;  Surgeon: Corbin Ade, MD;  [facility];  Service: Endoscopy;  Laterality: N/A;   CRANIECTOMY Right 04/01/2017   Procedure: RIGHT DECOMPRESSIVE CRANIECTOMY;  Surgeon: Ditty, Loura Halt, MD;  Location: [facility] OR;  Service: Neurosurgery;  Laterality: Right;   ESOPHAGOGASTRODUODENOSCOPY N/A 04/13/2017   Procedure: ESOPHAGOGASTRODUODENOSCOPY (EGD);  Surgeon: Violeta Gelinas, MD;  Location: [facility] ENDOSCOPY;  Service: General;  Laterality: N/A;  bedside   ESOPHAGOGASTRODUODENOSCOPY (EGD) WITH PROPOFOL N/A 01/11/2023   Procedure: ESOPHAGOGASTRODUODENOSCOPY (EGD) WITH PROPOFOL;  Surgeon: Corbin Ade, MD;  [facility];  Service: Endoscopy;  Laterality: N/A;   INCISION AND DRAINAGE PERIRECTAL ABSCESS N/A 09/28/2017   Procedure: IRRIGATION AND DEBRIDEMENT PERIANAL ABSCESS;  Surgeon: Lucretia Roers, MD;  Location: [facility] ORS;  Service: General;  Laterality: N/A;   PEG PLACEMENT N/A 04/13/2017   Procedure: PERCUTANEOUS ENDOSCOPIC GASTROSTOMY (PEG) PLACEMENT;  Surgeon: Violeta Gelinas, MD;  Location: [facility] ENDOSCOPY;  Service: General;  Laterality: N/A;   POLYPECTOMY  01/11/2023   Procedure: POLYPECTOMY INTESTINAL;  Surgeon: Corbin Ade, MD;  Location: [facility] END[facility];  Service: Endoscopy;;   Social History   Socioeconomic History   Marital status: Unknown    Spouse name: Not on file   Number of children: Not on file   Years of education: Not on file   Highest education level: Not  on file  Occupational History   Not on file  Tobacco Use   Smoking status: Former    Types: Cigarettes   Smokeless tobacco: Never   Tobacco comments:    UTA  Vaping Use   Vaping status: Never Used  Substance and Sexual Activity   Alcohol use: No    Comment: UTA   Drug use: No    Comment: UTA   Sexual activity: Not Currently    Birth control/protection: None  Other Topics Concern   Not on file  Social History Narrative   Not on file   Social Determinants of Health   Financial Resource Strain: Not on file  Food Insecurity: No Food Insecurity (01/09/2023)   Hunger Vital Sign    Worried About Running Out of Food in the Last Year: Never true    Ran Out of Food in the Last Year: Never true  Transportation Needs: No Transportation Needs (01/09/2023)   PRAPARE - Administrator, Civil Service (Medical): No    Lack of Transportation (Non-Medical): No  Physical Activity: Not on file  Stress: Not on file  Social Connections: Not on file   Family History  Problem Relation Age of Onset   Hypertension Father    Colon cancer Neg Hx    Colon polyps Neg Hx  Outpatient Encounter Medications as of 05/04/2023  Medication Sig   acetaminophen (TYLENOL) 325 MG tablet Take 650 mg by mouth every 4 (four) hours as needed. Every 4-6 hours as needed, not to exceed 3 grams in 24 hours   apixaban (ELIQUIS) 5 MG TABS tablet Take 5 mg by mouth 2 (two) times daily.    atorvastatin (LIPITOR) 20 MG tablet Take 20 mg by mouth at bedtime.   Cholecalciferol 1.25 MG (50000 UT) capsule Take 50,000 Units by mouth once a week. For Low Vitamin D Level on Monday   dapagliflozin propanediol (FARXIGA) 10 MG TABS tablet Take 10 mg by mouth daily.   Dextromethorphan-quiNIDine (NUEDEXTA) 20-10 MG capsule Take 1 capsule by mouth 2 (two) times daily.   ferrous sulfate 325 (65 FE) MG EC tablet Take 325 mg by mouth 2 (two) times daily.   gabapentin (NEURONTIN) 100 MG capsule Take 100 mg by mouth at bedtime.    levETIRAcetam (KEPPRA XR) 500 MG 24 hr tablet Take 500 mg by mouth daily. At bedtime 8 pm   loratadine (CLARITIN) 10 MG tablet Take 10 mg by mouth daily.   LORazepam (ATIVAN) 2 MG/ML injection Inject 1 mg into the vein every 15 (fifteen) minutes as needed for seizure (every 15 mins as needed up to 3 doses for seizure lasting longer than 2 minutes).   Menthol, Topical Analgesic, (BIOFREEZE) 4 % GEL Apply 1 application  topically at bedtime as needed. Apply to left shoulder   metFORMIN (GLUCOPHAGE-XR) 500 MG 24 hr tablet Take 500 mg by mouth daily with breakfast.   NON FORMULARY Diet:Regular   omeprazole (PRILOSEC) 40 MG capsule Take 1 capsule (40 mg total) by mouth daily.   ondansetron (ZOFRAN-ODT) 8 MG disintegrating tablet Take 8 mg by mouth every 6 (six) hours as needed for nausea or vomiting.   polyethylene glycol (MIRALAX / GLYCOLAX) 17 g packet Take 17 g by mouth daily. 9 pm   QUEtiapine (SEROQUEL) 100 MG tablet Take 100 mg by mouth at bedtime.   QUEtiapine (SEROQUEL) 25 MG tablet Take 75 mg by mouth daily. At 10 am   sennosides-docusate sodium (SENOKOT-S) 8.6-50 MG tablet Take 1 tablet by mouth daily as needed for constipation.   No facility-administered encounter medications on file as of 05/04/2023.   ALLERGIES: Allergies  Allergen Reactions   Cheese    Penicillins Hives    Has patient had a PCN reaction causing immediate rash, facial/tongue/throat swelling, SOB or lightheadedness with hypotension: Unknown Has patient had a PCN reaction causing severe rash involving mucus membranes or skin necrosis: Unknown Has patient had a PCN reaction that required hospitalization: No Has patient had a PCN reaction occurring within the last 10 years: No If all of the above answers are "NO", then may proceed with Cephalosporin use.     VACCINATION STATUS: Immunization History  Administered Date(s) Administered   Influenza-Unspecified 05/13/2019, 05/15/2020, 05/10/2022   Moderna Covid-19  Vaccine Bivalent Booster 66yrs & up 07/21/2021, 09/08/2021, 06/01/2022   Moderna SARS-COV2 Booster Vaccination 06/01/2021, 12/28/2021   Moderna Sars-Covid-2 Vaccination 08/14/2019, 09/11/2019, 06/11/2020, 11/18/2020   Pneumococcal Polysaccharide-23 03/29/2020   Pneumococcal-Unspecified 05/10/2018   Tdap 01/07/2019   Zoster Recombinant(Shingrix) 12/03/2021, 03/02/2022    HPI Tiam Neller is 62 y.o. male who presents today with a medical history as above. he is being seen in consultation for multinodular goiter requested by Sharee Holster, NP.  This patient was previously seen in this clinic for immobilization hypercalcemia.  He was given IV bisphosphonate and that  hypercalcemia has stabilized.  Patient is a nursing home resident.  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 After he was found to have clinical goiter, on April 13, 2021 he underwent dedicated thyroid ultrasound.  That study revealed 8 cm right lobe with 2 nodules measuring 6.1 and 1.7 cm.  Left lobe measuring 7.2 cm with 2 nodules measuring 5.3 and 2.4 cm.  3 of these nodules were reported to be suspicious and FNA was recommended.  His biopsy results are negative.  The sample from the right sided nodule required Afirma testing which returned approximately 4% risk of malignancy, essentially benign.    He presents with no new complaints today.  He has no major problems with swallowing, breathing, speaking.  Thyroid function was normal in February 2022.   He denies any family history of thyroid malignancy. Is not accompanied by his aide from nursing home, provide his own history. His medical history also includes type 2 diabetes on management with Januvia, metformin.  He also has hyperlipidemia and hypertension on treatment. -He reports steady weight, no recent significant weight change.  Denies any tremors, heat intolerance, palpitations, no diaphoresis.    Review of  Systems  Constitutional: no recent weight gain/loss, no fatigue, no subjective hyperthermia, no subjective hypothermia   Objective:       05/04/2023   11:02 AM 04/18/2023   12:17 PM 04/14/2023    2:22 PM  Vitals with BMI  Height  5\' 6"    Weight  174 lbs 8 oz 174 lbs 8 oz  BMI  28.18 28.18  Systolic 124 118 161  Diastolic 78 78 72  Pulse 68 69 65    BP 124/78   Pulse 68   Wt Readings from Last 3 Encounters:  04/18/23 174 lb 8 oz (79.2 kg)  04/14/23 174 lb 8 oz (79.2 kg)  04/05/23 172 lb (78 kg)    Physical Exam  Constitutional:  There is no height or weight on file to calculate BMI.,  not in acute distress, normal state of mind, + wheelchair-bound with left-sided hemiparesis.   CMP ( most recent) CMP     Component Value Date/Time   NA 134 (L) 03/22/2023 1053   K 4.0 03/22/2023 1053   CL 105 03/22/2023 1053   CO2 21 (L) 03/22/2023 1053   GLUCOSE 221 (H) 03/22/2023 1053   BUN 8 03/22/2023 1053   CREATININE 0.78 03/22/2023 1053   CALCIUM 8.7 (L) 03/22/2023 1053   CALCIUM 11.5 (H) 09/05/2017 0709   PROT 6.3 (L) 01/09/2023 0800   ALBUMIN 2.7 (L) 01/09/2023 0800   AST 8 (L) 01/09/2023 0800   ALT 11 01/09/2023 0800   ALKPHOS 46 01/09/2023 0800   BILITOT 0.3 01/09/2023 0800   GFRNONAA >60 03/22/2023 1053   GFRAA >60 04/06/2020 0630     Diabetic Labs (most recent): Lab Results  Component Value Date   HGBA1C 7.7 (H) 04/17/2023   HGBA1C 5.3 01/10/2023   HGBA1C 5.3 06/24/2022   MICROALBUR 15.3 (H) 09/29/2022   MICROALBUR 92.8 (H) 05/03/2021   MICROALBUR 27.4 (H) 02/06/2020     Lipid Panel ( most recent) Lipid Panel     Component Value Date/Time   CHOL 99 01/09/2023 0800   TRIG 118 01/09/2023 0800   HDL 29 (L) 01/09/2023 0800   CHOLHDL 3.4 01/09/2023 0800   VLDL 24 01/09/2023 0800   LDLCALC 46 01/09/2023 0800      Lab Results  Component Value Date  TSH 1.484 05/03/2023   TSH 1.886 02/07/2023   TSH 1.501 01/09/2023   TSH 2.255 08/31/2022   TSH  1.240 07/30/2022   TSH 3.168 07/28/2022   TSH 1.238 06/24/2022   TSH 1.318 01/19/2022   TSH 1.233 09/10/2020   TSH 0.811 03/28/2020   FREET4 0.73 05/03/2023   FREET4 0.89 02/07/2023   FREET4 0.82 08/31/2022   FREET4 0.88 07/30/2022   FREET4 0.79 06/24/2022    Thyroid ultrasound on April 13, 2021.  Right lobe: 8.0 x 5.1 x 6.6 cm, 2 nodules measuring 6.1 cm and 1.7 cm   Left lobe: 7.2 x 3.9 x 4.7 cm, 2 nodules measuring 5.3 cm and 2.4 cm  IMPRESSION: 1. Enlarged, multinodular thyroid gland. 2. Nodule labeled 2, 3 and 4 all meet criteria for biopsy. Current recommendation is to only biopsy the 2 most suspicious nodules. Therefore nodules labeled 2 and 4 would be recommended for percutaneous biopsy.  Fine-needle aspiration biopsy on June 10, 2021: Left-sided nodule negative.  Right thyroid nodule atypia of undetermined significance, Afirma negative.  Assessment & Plan:   1. Multinodular goiter  I discussed his fine-needle aspiration biopsy findings with him and his caretaker.  He has bilateral nodules, no malignancy.  He will not need surgical intervention at this time.  His presentation is still consistent with euthyroidism.     No need for prescription for thyroid hormone at this time. He will return in 1 year with repeat thyroid function tests as well as thyroid/neck ultrasound.  - I did not initiate any new prescriptions today. - he is advised to maintain close follow up with Sharee Holster, NP for primary care needs.   I spent  21  minutes in the care of the patient today including review of labs from Thyroid Function, CMP, and other relevant labs ; imaging/biopsy records (current and previous including abstractions from other facilities); face-to-face time discussing  his lab results and symptoms, medications doses, his options of short and long term treatment based on the latest standards of care / guidelines;   and documenting the encounter.  Janina Mayo   participated in the discussions, expressed understanding, and voiced agreement with the above plans.  All questions were answered to his satisfaction. he is encouraged to contact clinic should he have any questions or concerns prior to his return visit.    Follow up plan: Return in about 1 year (around 05/03/2024) for F/U with Pre-visit Labs, Thyroid / Neck Ultrasound.   Marquis Lunch, MD [facility] [address] [place], [place] [address] Phone: [phone]  Fax: [phone]     05/04/2023, 3:51 PM  This note was partially dictated with voice recognition software. Similar sounding words can be transcribed inadequately or may not  be corrected upon review.

## 2023-05-15 ENCOUNTER — Ambulatory Visit (HOSPITAL_COMMUNITY)
Admission: RE | Admit: 2023-05-15 | Discharge: 2023-05-15 | Disposition: A | Discharge status: Home / Self Care | Payer: Medicaid Other | Attending: Internal Medicine | Admitting: Internal Medicine

## 2023-05-15 ENCOUNTER — Encounter (HOSPITAL_COMMUNITY)
Admission: RE | Disposition: A | Discharge status: Home / Self Care | Payer: Self-pay | Source: Home / Self Care | Attending: Internal Medicine

## 2023-05-15 DIAGNOSIS — D500 Iron deficiency anemia secondary to blood loss (chronic): Secondary | ICD-10-CM | POA: Diagnosis not present

## 2023-05-15 DIAGNOSIS — Z860101 Personal history of adenomatous and serrated colon polyps: Secondary | ICD-10-CM | POA: Insufficient documentation

## 2023-05-15 DIAGNOSIS — I890 Lymphedema, not elsewhere classified: Secondary | ICD-10-CM | POA: Diagnosis not present

## 2023-05-15 DIAGNOSIS — K639 Disease of intestine, unspecified: Secondary | ICD-10-CM

## 2023-05-15 DIAGNOSIS — I4891 Unspecified atrial fibrillation: Secondary | ICD-10-CM | POA: Diagnosis not present

## 2023-05-15 DIAGNOSIS — Z9889 Other specified postprocedural states: Secondary | ICD-10-CM | POA: Diagnosis not present

## 2023-05-15 DIAGNOSIS — R195 Other fecal abnormalities: Secondary | ICD-10-CM | POA: Diagnosis not present

## 2023-05-15 DIAGNOSIS — Z7901 Long term (current) use of anticoagulants: Secondary | ICD-10-CM | POA: Diagnosis not present

## 2023-05-15 DIAGNOSIS — D509 Iron deficiency anemia, unspecified: Secondary | ICD-10-CM | POA: Insufficient documentation

## 2023-05-15 HISTORY — PX: GIVENS CAPSULE STUDY: SHX5432

## 2023-05-15 SURGERY — IMAGING PROCEDURE, GI TRACT, INTRALUMINAL, VIA CAPSULE

## 2023-05-17 ENCOUNTER — Non-Acute Institutional Stay (SKILLED_NURSING_FACILITY): Payer: Medicaid Other | Admitting: Adult Health

## 2023-05-17 ENCOUNTER — Encounter: Payer: Self-pay | Admitting: Adult Health

## 2023-05-17 DIAGNOSIS — E1142 Type 2 diabetes mellitus with diabetic polyneuropathy: Secondary | ICD-10-CM | POA: Diagnosis not present

## 2023-05-17 DIAGNOSIS — K5909 Other constipation: Secondary | ICD-10-CM

## 2023-05-17 DIAGNOSIS — R569 Unspecified convulsions: Secondary | ICD-10-CM

## 2023-05-17 NOTE — Progress Notes (Signed)
Location:  [facility] Room Number: 113-D Place of Service:  SNF (31)   CODE STATUS: DNR  Allergies  Allergen Reactions   Cheese    Penicillins Hives    Has patient had a PCN reaction causing immediate rash, facial/tongue/throat swelling, SOB or lightheadedness with hypotension: Unknown Has patient had a PCN reaction causing severe rash involving mucus membranes or skin necrosis: Unknown Has patient had a PCN reaction that required hospitalization: No Has patient had a PCN reaction occurring within the last 10 years: No If all of the above answers are "NO", then may proceed with Cephalosporin use.     Chief Complaint  Patient presents with   Medical Management of Chronic Issues      Diabetic peripheral neuropathy:    Seizure:     Chronic constipation     HPI:  He is a 62 year old long term resident of this facility being seen for the management for her chronic illnesses: Diabetic peripheral neuropathy:    Seizure:     Chronic constipation. He denies any uncontrolled pain. He continues to get out of bed daily; propels self around facility. He denies any issues with constipation.   Past Medical History:  Diagnosis Date   A-fib [facility])    DM (diabetes mellitus) (HCC)    History of seizures    Hypertension    Stroke [facility])     Past Surgical History:  Procedure Laterality Date   CHOLECYSTECTOMY     COLONOSCOPY WITH PROPOFOL N/A 01/11/2023   Procedure: COLONOSCOPY WITH PROPOFOL;  Surgeon: Corbin Ade, MD;  [facility];  Service: Endoscopy;  Laterality: N/A;   CRANIECTOMY Right 04/01/2017   Procedure: RIGHT DECOMPRESSIVE CRANIECTOMY;  Surgeon: Ditty, Loura Halt, MD;  Location: [facility] OR;  Service: Neurosurgery;  Laterality: Right;   ESOPHAGOGASTRODUODENOSCOPY N/A 04/13/2017   Procedure: ESOPHAGOGASTRODUODENOSCOPY (EGD);  Surgeon: Violeta Gelinas, MD;  Location: [facility] ENDOSCOPY;  Service: General;  Laterality: N/A;  bedside    ESOPHAGOGASTRODUODENOSCOPY (EGD) WITH PROPOFOL N/A 01/11/2023   Procedure: ESOPHAGOGASTRODUODENOSCOPY (EGD) WITH PROPOFOL;  Surgeon: Corbin Ade, MD;  [facility];  Service: Endoscopy;  Laterality: N/A;   INCISION AND DRAINAGE PERIRECTAL ABSCESS N/A 09/28/2017   Procedure: IRRIGATION AND DEBRIDEMENT PERIANAL ABSCESS;  Surgeon: Lucretia Roers, MD;  Location: [facility] ORS;  Service: General;  Laterality: N/A;   PEG PLACEMENT N/A 04/13/2017   Procedure: PERCUTANEOUS ENDOSCOPIC GASTROSTOMY (PEG) PLACEMENT;  Surgeon: Violeta Gelinas, MD;  Location: [facility] ENDOSCOPY;  Service: General;  Laterality: N/A;   POLYPECTOMY  01/11/2023   Procedure: POLYPECTOMY INTESTINAL;  Surgeon: Corbin Ade, MD;  [facility];  Service: Endoscopy;;    Social History   Socioeconomic History   Marital status: Widowed    Spouse name: Not on file   Number of children: Not on file   Years of education: Not on file   Highest education level: Not on file  Occupational History   Not on file  Tobacco Use   Smoking status: Former    Types: Cigarettes   Smokeless tobacco: Never   Tobacco comments:    UTA  Vaping Use   Vaping status: Never Used  Substance and Sexual Activity   Alcohol use: No    Comment: UTA   Drug use: No    Comment: UTA   Sexual activity: Not Currently    Birth control/protection: None  Other Topics Concern   Not on file  Social History Narrative   Not  on file   Social Determinants of Health   Financial Resource Strain: Not on file  Food Insecurity: No Food Insecurity (01/09/2023)   Hunger Vital Sign    Worried About Running Out of Food in the Last Year: Never true    Ran Out of Food in the Last Year: Never true  Transportation Needs: No Transportation Needs (01/09/2023)   PRAPARE - Administrator, Civil Service (Medical): No    Lack of Transportation (Non-Medical): No  Physical Activity: Not on file  Stress: Not on file  Social Connections: Not on file   Intimate Partner Violence: Not At Risk (01/09/2023)   Humiliation, Afraid, Rape, and Kick questionnaire    Fear of Current or Ex-Partner: No    Emotionally Abused: No    Physically Abused: No    Sexually Abused: No   Family History  Problem Relation Age of Onset   Hypertension Father    Colon cancer Neg Hx    Colon polyps Neg Hx       VITAL SIGNS BP (!) 100/56   Pulse 82   Temp 97.8 F (36.6 C)   Resp 19   Ht 5\' 6"  (1.676 m)   Wt 178 lb 12.8 oz (81.1 kg)   SpO2 99%   BMI 28.86 kg/m   Outpatient Encounter Medications as of 05/17/2023  Medication Sig   acetaminophen (TYLENOL) 325 MG tablet Take 650 mg by mouth every 4 (four) hours as needed. Every 4-6 hours as needed, not to exceed 3 grams in 24 hours   apixaban (ELIQUIS) 5 MG TABS tablet Take 5 mg by mouth 2 (two) times daily.    atorvastatin (LIPITOR) 20 MG tablet Take 20 mg by mouth at bedtime.   Cholecalciferol 1.25 MG (50000 UT) capsule Take 50,000 Units by mouth once a week. For Low Vitamin D Level on Monday   dapagliflozin propanediol (FARXIGA) 10 MG TABS tablet Take 10 mg by mouth daily.   Dextromethorphan-quiNIDine (NUEDEXTA) 20-10 MG capsule Take 1 capsule by mouth 2 (two) times daily.   ferrous sulfate 325 (65 FE) MG EC tablet Take 325 mg by mouth 2 (two) times daily.   gabapentin (NEURONTIN) 100 MG capsule Take 100 mg by mouth at bedtime.   levETIRAcetam (KEPPRA XR) 500 MG 24 hr tablet Take 500 mg by mouth daily. At bedtime 8 pm   loratadine (CLARITIN) 10 MG tablet Take 10 mg by mouth daily.   LORazepam (ATIVAN) 2 MG/ML injection Inject 1 mg into the vein every 15 (fifteen) minutes as needed for seizure (every 15 mins as needed up to 3 doses for seizure lasting longer than 2 minutes).   Menthol, Topical Analgesic, (BIOFREEZE) 4 % GEL Apply 1 application  topically at bedtime as needed. Apply to left shoulder   metFORMIN (GLUCOPHAGE-XR) 500 MG 24 hr tablet Take 500 mg by mouth daily with breakfast.   NON FORMULARY  Diet:Regular   omeprazole (PRILOSEC) 40 MG capsule Take 1 capsule (40 mg total) by mouth daily.   ondansetron (ZOFRAN-ODT) 8 MG disintegrating tablet Take 8 mg by mouth every 6 (six) hours as needed for nausea or vomiting.   polyethylene glycol (MIRALAX / GLYCOLAX) 17 g packet Take 17 g by mouth daily. 9 pm   QUEtiapine (SEROQUEL) 100 MG tablet Take 100 mg by mouth at bedtime.   QUEtiapine (SEROQUEL) 25 MG tablet Take 75 mg by mouth daily. At 10 am   sennosides-docusate sodium (SENOKOT-S) 8.6-50 MG tablet Take 1 tablet by mouth  daily as needed for constipation.   No facility-administered encounter medications on file as of 05/17/2023.     SIGNIFICANT DIAGNOSTIC EXAMS  PREVIOUS   06-10-21: thyroid biopsy   01-11-23: EGD: normal esophagus; stomach, doudenal bulb and 2dn portion of duodenum; peg scar apparent   01-11-23: colonoscopy:  Two 3-5 mm polyp at hepatic flexure and in the cecum; removed with cold snare Diverticulosis in sigmoid colon and in the descending colon, redundant elongated colon Otherwise normal Doubt signs of GI bleed; may restart eliquis.   NO NEW EXAMS    LABS REVIEWED PREVIOUS:     06-24-22: wbc 9.2; hgb 10.0; hct 34.9; mcv 87.7 plt 241; glucose 145; bun 8; creat 0.67; k+ 3.6; na++ 140; ca 8.5; gfr >60; protein 6.0; albumin 2.5; vitamin D 43.99; chol 116; ldl 68; trig 78; hdl 32; tsh 1.238 free t4: 0.79 hgb A1c 7.5 07-30-22: tsh 1.240 free t4: 0.88 t3: 118 08-31-22: tsh 2.255 free t3: 2.8; free t4: 0.82 09-29-22: ACR: 15 01-09-23: wbc 12.7; hgb 7.6; hct 28.5; mcv 75.4 plt 263; glucose 126; bun 13; creat 0.69; k+ 3.5; na++ 141; ca 9.0; gfr>60; protein 6.3 albumin 2.7 chol 99; ldl 46; trig 118; hdl 29; tsh 1.501 01-09-23 (#2); hgb 7.7; hct 28.9  01-10-23: wbc 13.0; hgb 7.7; hct 28.6; mcv 74.5; plt 268; hgb A1c 5.3; iron 18; tibc 374 HIV nr 01-11-23: wbc 11.1; hgb 8.2; hct 29.7 mcv 72.8 plt 272 01-12-23: wbc 12.9; hgb 8.4; hct 31.5; mcv 73.4 plt 291 01-16-23: wbc 12.3; hgb 7.5;  hct 28.0; mcv 74.7 plt 286  01-19-23: hgb 8.3; hct 31.8 02-07-23: tsh 1.886 free t3: 2.6; free t4: 0.89 02-20-23: wbc 11.2; hgb 9.4; hct 34.6; mcv 80.7 plt 221; folate 10.1; vitamin B12: 613; iron 14 tibc 367; ferritin 11.  03-22-23: wbc 6.8; hgb 13.0; hct 44.5; mcv 82.7 plt clump; glucose 221; bun 8; creat 0.78; k+ 4.0; na++ 134; ca 8.7; gfr >60; d-dimer <0.27 04-17-23 hgb A1c 7.7   NO NEW LABS.   Review of Systems  Constitutional:  Negative for malaise/fatigue.  Respiratory:  Negative for cough and shortness of breath.   Cardiovascular:  Negative for chest pain, palpitations and leg swelling.  Gastrointestinal:  Negative for abdominal pain, constipation and heartburn.  Musculoskeletal:  Negative for back pain, joint pain and myalgias.  Skin: Negative.   Neurological:  Negative for dizziness.  Psychiatric/Behavioral:  The patient is not nervous/anxious.     Physical Exam Constitutional:      General: He is not in acute distress.    Appearance: He is well-developed. He is not diaphoretic.  Neck:     Thyroid: Thyroid mass and thyromegaly present.  Cardiovascular:     Rate and Rhythm: Normal rate and regular rhythm.     Pulses: Normal pulses.     Heart sounds: Normal heart sounds.  Pulmonary:     Effort: Pulmonary effort is normal. No respiratory distress.     Breath sounds: Normal breath sounds.  Abdominal:     General: Bowel sounds are normal. There is no distension.     Palpations: Abdomen is soft.     Tenderness: There is no abdominal tenderness.  Musculoskeletal:     Cervical back: Neck supple.     Right lower leg: No edema.     Left lower leg: No edema.     Comments:  Left hemiplegia     Lymphadenopathy:     Cervical: No cervical adenopathy.  Skin:    General:  Skin is warm and dry.  Neurological:     Mental Status: He is alert. Mental status is at baseline.     Comments: 03-24-23: SLUMS 17/30   Psychiatric:        Mood and Affect: Mood normal.       ASSESSMENT/  PLAN:  TODAY;   Diabetic peripheral neuropathy: will continue gabapentin 100 mg nightly   2. Seizure: no recent activity will continue keppra xl 500 mg daily   3. Chronic constipation: will continue senna s daily as needed    PREVIOUS   4. 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 Vitamin D deficiency: 43.99 will is off supplement   6. 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 GERD without esophagitis: will continue prilosec 40 mg daily   8. Multinodular goiter: is status post aspiration; saw endocrinology on 05-04-23.   9. Type 2 diabetes mellitus with neurological complications: hgb A1c 7.5 will continue metformin xr 500 mg daily and rybelsis 7 mg daily   10. Hypertension associated with type 2 diabetes mellitus: b/p 100/64 is off medications  11. Delusions/paranoia will continue seroquel 75 mg in the AM and 100 mg in the PM   12. PAF (paroxsymal atrial fibrillation) heart rate is stable will continue eliquis 5 mg twice daily   13.  PBA: has fewer verbal out bursts will continue nuedexta twice daily   14. Hypochromic anemia: hgb 13.0   15. Protein calorie malnutrition severe: albumin 2.5 has declined prostat  16. Dyslipidemia associated with type 2 diabetes mellitus: ldl 68 will continue lipitor 20 mg daily      Synthia Innocent NP [facility]   call [phone]

## 2023-05-18 ENCOUNTER — Other Ambulatory Visit (HOSPITAL_COMMUNITY)
Admission: RE | Admit: 2023-05-18 | Discharge: 2023-05-18 | Disposition: A | Discharge status: Home / Self Care | Payer: Medicaid Other | Source: Ambulatory Visit | Attending: Adult Health | Admitting: Adult Health

## 2023-05-18 ENCOUNTER — Encounter: Payer: Self-pay | Admitting: Adult Health

## 2023-05-18 DIAGNOSIS — R972 Elevated prostate specific antigen [PSA]: Secondary | ICD-10-CM | POA: Diagnosis present

## 2023-05-18 LAB — PSA: Prostatic Specific Antigen: 3.15 ng/mL (ref 0.00–4.00)

## 2023-05-22 ENCOUNTER — Encounter (HOSPITAL_COMMUNITY): Payer: Self-pay | Admitting: Internal Medicine

## 2023-05-29 ENCOUNTER — Telehealth: Payer: Self-pay | Admitting: Gastroenterology

## 2023-05-29 ENCOUNTER — Telehealth: Payer: Self-pay

## 2023-05-29 DIAGNOSIS — K639 Disease of intestine, unspecified: Secondary | ICD-10-CM

## 2023-05-29 NOTE — Op Note (Addendum)
  Small Bowel Givens Capsule Study Procedure date:  05/15/23  Referring Provider:  Leanna Battles. Dixon Boos PCP:  Dr. Chilton Si, Chong Sicilian, NP  Indication for procedure:  IDA, hemoccult positive stool, chronically anticoagulated in the setting Afib. EGD 01/2023, normal. Colonoscopy 01/2023, with two small tubular adenomas removed at hepatic flexure and cecum.  Patient data:  Wt: 79.1kg Ht: 5'6"  Findings: Capsule study complete.  Capsule reached the cecum during the study.  Single lymphangiectasia at 1 hour 55 minutes 52 seconds (small bowel progress 10%).  Several small bowel diverticulum noted distally, beginning at 4 hours 1 minute 56 seconds (small bowel progress 86%).  See pictures.  Lesion at 4 hours 14 minutes 32 seconds (small bowel progress 88%) potentially also a small bowel diverticulum but a little more suspicious in appearance.  No active bleeding noted throughout the study.  Pertinent images reviewed by Dr. Jena Gauss as well.    First Gastric image: 1 minute 43 seconds First Duodenal image: 1 hour 52 minutes and 4 seconds First Ileo-Cecal Valve image: 4 hours 31 minutes 43 seconds First Cecal image: 4 hours 31 minutes 52 seconds Gastric Passage time: 1 hour 50 minutes  Small Bowel Passage time: 2 hours 39 minutes       Summary & Recommendations: No active bleeding noted on study.  Continue to monitor for overt GI bleeding and decline in hemoglobin on a regular basis in the setting of chronic anticoagulation.  There has been no GI source found to explain IDA, hemoccult positive stool. To evaluate small bowel lesion, will order CTE to further image small bowel.     Leanna Battles. Dixon Boos [facility] [phone] 10/21/20244:05 PM    Attending note: pertinent images reviewed; agree with summary and recommendations.

## 2023-05-29 NOTE — Telephone Encounter (Signed)
-----   Message from Eula Listen sent at 05/29/2023  7:35 AM EDT ----- Apparently capsule study done 10/7.-However the APP that supposed to read it needs to do so.

## 2023-05-29 NOTE — Telephone Encounter (Signed)
Please let patient's guardian and nursing home know that his small bowel capsule did not show active bleeding. He has a suspicious small bowel lesion that needs further imaging by CT scan.   Recommend he continue to have his Hgb checked periodically by nursing home provider, ideally once every 8 weeks. Continue to monitor for signs of black or bloody stool.  Please arrange for CTE for suspicious distal small bowel lesion seen on small bowel capsule study.

## 2023-05-31 ENCOUNTER — Other Ambulatory Visit: Payer: Self-pay | Admitting: *Deleted

## 2023-05-31 ENCOUNTER — Encounter: Payer: Self-pay | Admitting: *Deleted

## 2023-05-31 DIAGNOSIS — K639 Disease of intestine, unspecified: Secondary | ICD-10-CM

## 2023-05-31 NOTE — Telephone Encounter (Signed)
Left message on Joe Wilkinson's vm to return call

## 2023-05-31 NOTE — Telephone Encounter (Signed)
Informed Mavis from [facility] of CTE scheduled for Tuesday 06/20/23, arrive at 3 pm to check in, NPO 4 hours prior.

## 2023-05-31 NOTE — Telephone Encounter (Signed)
Spoke with Kathie Rhodes at [facility] and informed her of results and providers recommendations. She states that they have pt's sister Joe Wilkinson listed as his guardian and they haven't seen her in a while.

## 2023-06-20 ENCOUNTER — Ambulatory Visit (HOSPITAL_COMMUNITY)
Admission: RE | Admit: 2023-06-20 | Discharge: 2023-06-20 | Disposition: A | Discharge status: Home / Self Care | Payer: Medicaid Other | Source: Ambulatory Visit | Attending: Gastroenterology | Admitting: Gastroenterology

## 2023-06-20 DIAGNOSIS — K639 Disease of intestine, unspecified: Secondary | ICD-10-CM | POA: Diagnosis present

## 2023-06-20 MED ORDER — IOHEXOL 300 MG/ML  SOLN: 100.0000 mL | Freq: Once | INTRAMUSCULAR | Status: DC | PRN

## 2023-06-21 ENCOUNTER — Telehealth: Payer: Self-pay | Admitting: *Deleted

## 2023-06-21 LAB — POCT I-STAT CREATININE: Creatinine, Ser: 1 mg/dL (ref 0.61–1.24)

## 2023-06-21 NOTE — Telephone Encounter (Signed)
Per April in CT "Hey, This pt came from [place] [facility] to get Enterography abd/pel. We had 3 techs and 1 RN from swot to try for an IV. After an hour the tech that brought him from [place] [facility] had to take him back. She couldn't wait for [place] to get someone from the ER to bring ultrasound and try again because pt still needed to drink for an hour and she couldn't stay with pt due to over time. Didn't have anyone else to come sit with him. He will need to be rescheduled, probably at an earlier time slot so if y'all have trouble we have Becca."  Victorino Dike in CT requested patient be scheduled for an early morning on M,T,W.  Called central scheduling and he has been rescheduled for 11/20, arrival at 11:30am to start drinking the oral contrast.  I called Kathie Rhodes at [facility] and she is aware of appt details. She also asked that I fax this to her at [phone]. I have done so.

## 2023-06-23 ENCOUNTER — Non-Acute Institutional Stay (SKILLED_NURSING_FACILITY): Payer: Medicaid Other | Admitting: Adult Health

## 2023-06-23 ENCOUNTER — Encounter: Payer: Self-pay | Admitting: Adult Health

## 2023-06-23 DIAGNOSIS — I152 Hypertension secondary to endocrine disorders: Secondary | ICD-10-CM | POA: Diagnosis not present

## 2023-06-23 DIAGNOSIS — E042 Nontoxic multinodular goiter: Secondary | ICD-10-CM | POA: Diagnosis not present

## 2023-06-23 DIAGNOSIS — E1159 Type 2 diabetes mellitus with other circulatory complications: Secondary | ICD-10-CM | POA: Diagnosis not present

## 2023-06-23 DIAGNOSIS — R569 Unspecified convulsions: Secondary | ICD-10-CM

## 2023-06-23 NOTE — Progress Notes (Signed)
Location:  [facility] Room Number: 113 Place of Service:  SNF (31)   CODE STATUS: dnr   Allergies  Allergen Reactions   Cheese    Penicillins Hives    Has patient had a PCN reaction causing immediate rash, facial/tongue/throat swelling, SOB or lightheadedness with hypotension: Unknown Has patient had a PCN reaction causing severe rash involving mucus membranes or skin necrosis: Unknown Has patient had a PCN reaction that required hospitalization: No Has patient had a PCN reaction occurring within the last 10 years: No If all of the above answers are "NO", then may proceed with Cephalosporin use.     Chief Complaint  Patient presents with   Acute Visit    Care plan meeting     HPI:  We have come together for his care plan meeting. BIMS 13/15 mood 0/30. Uses wheelchair without falls. He is dependent assist with adl care. He is incontinent of bladder and bowel. Dietary: regular diet: setup for meals; appetite 76-100%weight is 182.4 pounds. Therapy: none at this time. Activities: socializing; tv snacks; bingo. He continues to be followed for his chronic illnesses including:   Seizure   Multinodular goiter   Hypertension associated with type 2 diabetes mellitus  Past Medical History:  Diagnosis Date   A-fib (HCC)    DM (diabetes mellitus) (HCC)    History of seizures    Hypertension    Stroke [facility])     Past Surgical History:  Procedure Laterality Date   CHOLECYSTECTOMY     COLONOSCOPY WITH PROPOFOL N/A 01/11/2023   Procedure: COLONOSCOPY WITH PROPOFOL;  Surgeon: Corbin Ade, MD;  [facility] ENDO [facility];  Service: Endoscopy;  Laterality: N/A;   CRANIECTOMY Right 04/01/2017   Procedure: RIGHT DECOMPRESSIVE CRANIECTOMY;  Surgeon: Ditty, Loura Halt, MD;  Location: [facility] OR;  Service: Neurosurgery;  Laterality: Right;   ESOPHAGOGASTRODUODENOSCOPY N/A 04/13/2017   Procedure: ESOPHAGOGASTRODUODENOSCOPY (EGD);  Surgeon: Violeta Gelinas, MD;  Location: [facility]  ENDOSCOPY;  Service: General;  Laterality: N/A;  bedside   ESOPHAGOGASTRODUODENOSCOPY (EGD) WITH PROPOFOL N/A 01/11/2023   Procedure: ESOPHAGOGASTRODUODENOSCOPY (EGD) WITH PROPOFOL;  Surgeon: Corbin Ade, MD;  [facility];  Service: Endoscopy;  Laterality: N/A;   GIVENS CAPSULE STUDY N/A 05/15/2023   Procedure: GIVENS CAPSULE STUDY;  Surgeon: Corbin Ade, MD;  [facility]: [facility];  Service: Endoscopy;  Laterality: N/A;  [facility]   INCISION AND DRAINAGE PERIRECTAL ABSCESS N/A 09/28/2017   Procedure: IRRIGATION AND DEBRIDEMENT PERIANAL ABSCESS;  Surgeon: Lucretia Roers, MD;  Location: [facility] ORS;  Service: General;  Laterality: N/A;   PEG PLACEMENT N/A 04/13/2017   Procedure: PERCUTANEOUS ENDOSCOPIC GASTROSTOMY (PEG) PLACEMENT;  Surgeon: Violeta Gelinas, MD;  Location: [facility] ENDOSCOPY;  Service: General;  Laterality: N/A;   POLYPECTOMY  01/11/2023   Procedure: POLYPECTOMY INTESTINAL;  Surgeon: Corbin Ade, MD;  [facility]: [facility];  Service: Endoscopy;;    Social History   Socioeconomic History   Marital status: Widowed    Spouse name: Not on file   Number of children: Not on file   Years of education: Not on file   Highest education level: Not on file  Occupational History   Not on file  Tobacco Use   Smoking status: Former    Types: Cigarettes   Smokeless tobacco: Never   Tobacco comments:    UTA  Vaping Use   Vaping status: Never Used  Substance and Sexual Activity   Alcohol use: No  Comment: UTA   Drug use: No    Comment: UTA   Sexual activity: Not Currently    Birth control/protection: None  Other Topics Concern   Not on file  Social History Narrative   Not on file   Social Determinants of Health   Financial Resource Strain: Not on file  Food Insecurity: No Food Insecurity (01/09/2023)   Hunger Vital Sign    Worried About Running Out of Food in the Last Year: Never true    Ran Out of Food in the Last Year: Never true   Transportation Needs: No Transportation Needs (01/09/2023)   PRAPARE - Administrator, Civil Service (Medical): No    Lack of Transportation (Non-Medical): No  Physical Activity: Not on file  Stress: Not on file  Social Connections: Not on file  Intimate Partner Violence: Not At Risk (01/09/2023)   Humiliation, Afraid, Rape, and Kick questionnaire    Fear of Current or Ex-Partner: No    Emotionally Abused: No    Physically Abused: No    Sexually Abused: No   Family History  Problem Relation Age of Onset   Hypertension Father    Colon cancer Neg Hx    Colon polyps Neg Hx       VITAL SIGNS BP 100/66   Pulse 74   Temp 98.6 F (37 C)   Resp 20   Ht 5\' 6"  (1.676 m)   Wt 182 lb 6.4 oz (82.7 kg)   SpO2 97%   BMI 29.44 kg/m   Outpatient Encounter Medications as of 06/23/2023  Medication Sig   acetaminophen (TYLENOL) 325 MG tablet Take 650 mg by mouth every 4 (four) hours as needed. Every 4-6 hours as needed, not to exceed 3 grams in 24 hours   apixaban (ELIQUIS) 5 MG TABS tablet Take 5 mg by mouth 2 (two) times daily.    atorvastatin (LIPITOR) 20 MG tablet Take 20 mg by mouth at bedtime.   Cholecalciferol 1.25 MG (50000 UT) capsule Take 50,000 Units by mouth once a week. For Low Vitamin D Level on Monday   dapagliflozin propanediol (FARXIGA) 10 MG TABS tablet Take 10 mg by mouth daily.   Dextromethorphan-quiNIDine (NUEDEXTA) 20-10 MG capsule Take 1 capsule by mouth 2 (two) times daily.   ferrous sulfate 325 (65 FE) MG EC tablet Take 325 mg by mouth 2 (two) times daily.   gabapentin (NEURONTIN) 100 MG capsule Take 100 mg by mouth at bedtime.   levETIRAcetam (KEPPRA XR) 500 MG 24 hr tablet Take 500 mg by mouth daily. At bedtime 8 pm   loratadine (CLARITIN) 10 MG tablet Take 10 mg by mouth daily.   LORazepam (ATIVAN) 2 MG/ML injection Inject 1 mg into the vein every 15 (fifteen) minutes as needed for seizure (every 15 mins as needed up to 3 doses for seizure lasting  longer than 2 minutes).   Menthol, Topical Analgesic, (BIOFREEZE) 4 % GEL Apply 1 application  topically at bedtime as needed. Apply to left shoulder   metFORMIN (GLUCOPHAGE-XR) 500 MG 24 hr tablet Take 500 mg by mouth daily with breakfast.   NON FORMULARY Diet:Regular   omeprazole (PRILOSEC) 40 MG capsule Take 1 capsule (40 mg total) by mouth daily.   ondansetron (ZOFRAN-ODT) 8 MG disintegrating tablet Take 8 mg by mouth every 6 (six) hours as needed for nausea or vomiting.   polyethylene glycol (MIRALAX / GLYCOLAX) 17 g packet Take 17 g by mouth daily. 9 pm   QUEtiapine (SEROQUEL)  100 MG tablet Take 100 mg by mouth at bedtime.   QUEtiapine (SEROQUEL) 25 MG tablet Take 75 mg by mouth daily. At 10 am   sennosides-docusate sodium (SENOKOT-S) 8.6-50 MG tablet Take 1 tablet by mouth daily as needed for constipation.   No facility-administered encounter medications on file as of 06/23/2023.     SIGNIFICANT DIAGNOSTIC EXAMS   PREVIOUS   06-10-21: thyroid biopsy   01-11-23: EGD: normal esophagus; stomach, doudenal bulb and 2dn portion of duodenum; peg scar apparent   01-11-23: colonoscopy:  Two 3-5 mm polyp at hepatic flexure and in the cecum; removed with cold snare Diverticulosis in sigmoid colon and in the descending colon, redundant elongated colon Otherwise normal Doubt signs of GI bleed; may restart eliquis.   NO NEW EXAMS    LABS REVIEWED PREVIOUS:     06-24-22: wbc 9.2; hgb 10.0; hct 34.9; mcv 87.7 plt 241; glucose 145; bun 8; creat 0.67; k+ 3.6; na++ 140; ca 8.5; gfr >60; protein 6.0; albumin 2.5; vitamin D 43.99; chol 116; ldl 68; trig 78; hdl 32; tsh 1.238 free t4: 0.79 hgb A1c 7.5 07-30-22: tsh 1.240 free t4: 0.88 t3: 118 08-31-22: tsh 2.255 free t3: 2.8; free t4: 0.82 09-29-22: ACR: 15 01-09-23: wbc 12.7; hgb 7.6; hct 28.5; mcv 75.4 plt 263; glucose 126; bun 13; creat 0.69; k+ 3.5; na++ 141; ca 9.0; gfr>60; protein 6.3 albumin 2.7 chol 99; ldl 46; trig 118; hdl 29; tsh  1.501 01-09-23 (#2); hgb 7.7; hct 28.9  01-10-23: wbc 13.0; hgb 7.7; hct 28.6; mcv 74.5; plt 268; hgb A1c 5.3; iron 18; tibc 374 HIV nr 01-11-23: wbc 11.1; hgb 8.2; hct 29.7 mcv 72.8 plt 272 01-12-23: wbc 12.9; hgb 8.4; hct 31.5; mcv 73.4 plt 291 01-16-23: wbc 12.3; hgb 7.5; hct 28.0; mcv 74.7 plt 286  01-19-23: hgb 8.3; hct 31.8 02-07-23: tsh 1.886 free t3: 2.6; free t4: 0.89 02-20-23: wbc 11.2; hgb 9.4; hct 34.6; mcv 80.7 plt 221; folate 10.1; vitamin B12: 613; iron 14 tibc 367; ferritin 11.  03-22-23: wbc 6.8; hgb 13.0; hct 44.5; mcv 82.7 plt clump; glucose 221; bun 8; creat 0.78; k+ 4.0; na++ 134; ca 8.7; gfr >60; d-dimer <0.27 04-17-23 hgb A1c 7.7   NO NEW LABS.   Review of Systems  Constitutional:  Negative for malaise/fatigue.  Respiratory:  Negative for cough and shortness of breath.   Cardiovascular:  Negative for chest pain, palpitations and leg swelling.  Gastrointestinal:  Negative for abdominal pain, constipation and heartburn.  Musculoskeletal:  Negative for back pain, joint pain and myalgias.  Skin: Negative.   Neurological:  Negative for dizziness.  Psychiatric/Behavioral:  The patient is not nervous/anxious.    Physical Exam Constitutional:      General: He is not in acute distress.    Appearance: He is well-developed. He is not diaphoretic.  Neck:     Thyroid: Thyroid mass and thyromegaly present.  Cardiovascular:     Rate and Rhythm: Normal rate and regular rhythm.     Heart sounds: Normal heart sounds.  Pulmonary:     Effort: Pulmonary effort is normal. No respiratory distress.     Breath sounds: Normal breath sounds.  Abdominal:     General: Bowel sounds are normal. There is no distension.     Palpations: Abdomen is soft.     Tenderness: There is no abdominal tenderness.  Musculoskeletal:     Cervical back: Neck supple.     Right lower leg: No edema.  Left lower leg: No edema.     Comments: Left hemiplegia  Lymphadenopathy:     Cervical: No cervical adenopathy.   Skin:    General: Skin is warm and dry.  Neurological:     Mental Status: He is alert. Mental status is at baseline.     Comments: 03-24-23: SLUMS 17/30    Psychiatric:        Mood and Affect: Mood normal.     ASSESSMENT/ PLAN:  TODAY  Seizure Multinodular goiter Hypertension associated with type 2 diabetes mellitus  Will continue current medications Will continue care plan meeting Will continue to monitor his status.   Time spent with patient: 40 minutes: medications; plan of care; activities.    Synthia Innocent NP [facility]  call [phone]

## 2023-06-27 ENCOUNTER — Encounter: Payer: Self-pay | Admitting: Adult Health

## 2023-06-27 ENCOUNTER — Non-Acute Institutional Stay (SKILLED_NURSING_FACILITY): Payer: Medicaid Other | Admitting: Adult Health

## 2023-06-27 DIAGNOSIS — Z8673 Personal history of transient ischemic attack (TIA), and cerebral infarction without residual deficits: Secondary | ICD-10-CM

## 2023-06-27 DIAGNOSIS — E559 Vitamin D deficiency, unspecified: Secondary | ICD-10-CM | POA: Diagnosis not present

## 2023-06-27 DIAGNOSIS — K219 Gastro-esophageal reflux disease without esophagitis: Secondary | ICD-10-CM | POA: Diagnosis not present

## 2023-06-27 NOTE — Progress Notes (Unsigned)
Location:  [facility] Room Number: North/113/D Place of Service:  SNF (31)   CODE STATUS: DNR  Allergies  Allergen Reactions   Cheese    Penicillins Hives    Has patient had a PCN reaction causing immediate rash, facial/tongue/throat swelling, SOB or lightheadedness with hypotension: Unknown Has patient had a PCN reaction causing severe rash involving mucus membranes or skin necrosis: Unknown Has patient had a PCN reaction that required hospitalization: No Has patient had a PCN reaction occurring within the last 10 years: No If all of the above answers are "NO", then may proceed with Cephalosporin use.     Chief Complaint  Patient presents with   Medical Management of Chronic Issues    Routine Visit    Quality Metric Gaps    Eye Exam    HPI:    Past Medical History:  Diagnosis Date   A-fib (HCC)    DM (diabetes mellitus) (HCC)    History of seizures    Hypertension    Stroke [facility])     Past Surgical History:  Procedure Laterality Date   CHOLECYSTECTOMY     COLONOSCOPY WITH PROPOFOL N/A 01/11/2023   Procedure: COLONOSCOPY WITH PROPOFOL;  Surgeon: Corbin Ade, MD;  [facility];  Service: Endoscopy;  Laterality: N/A;   CRANIECTOMY Right 04/01/2017   Procedure: RIGHT DECOMPRESSIVE CRANIECTOMY;  Surgeon: Ditty, Loura Halt, MD;  Location: [facility] OR;  Service: Neurosurgery;  Laterality: Right;   ESOPHAGOGASTRODUODENOSCOPY N/A 04/13/2017   Procedure: ESOPHAGOGASTRODUODENOSCOPY (EGD);  Surgeon: Violeta Gelinas, MD;  Location: [facility] ENDOSCOPY;  Service: General;  Laterality: N/A;  bedside   ESOPHAGOGASTRODUODENOSCOPY (EGD) WITH PROPOFOL N/A 01/11/2023   Procedure: ESOPHAGOGASTRODUODENOSCOPY (EGD) WITH PROPOFOL;  Surgeon: Corbin Ade, MD;  [facility];  Service: Endoscopy;  Laterality: N/A;   GIVENS CAPSULE STUDY N/A 05/15/2023   Procedure: GIVENS CAPSULE STUDY;  Surgeon: Corbin Ade, MD;  [facility];  Service:  Endoscopy;  Laterality: N/A;  [facility]   INCISION AND DRAINAGE PERIRECTAL ABSCESS N/A 09/28/2017   Procedure: IRRIGATION AND DEBRIDEMENT PERIANAL ABSCESS;  Surgeon: Lucretia Roers, MD;  Location: [facility] ORS;  Service: General;  Laterality: N/A;   PEG PLACEMENT N/A 04/13/2017   Procedure: PERCUTANEOUS ENDOSCOPIC GASTROSTOMY (PEG) PLACEMENT;  Surgeon: Violeta Gelinas, MD;  Location: [facility] ENDOSCOPY;  Service: General;  Laterality: N/A;   POLYPECTOMY  01/11/2023   Procedure: POLYPECTOMY INTESTINAL;  Surgeon: Corbin Ade, MD;  [facility];  Service: Endoscopy;;    Social History   Socioeconomic History   Marital status: Widowed    Spouse name: Not on file   Number of children: Not on file   Years of education: Not on file   Highest education level: Not on file  Occupational History   Not on file  Tobacco Use   Smoking status: Former    Types: Cigarettes   Smokeless tobacco: Never   Tobacco comments:    UTA  Vaping Use   Vaping status: Never Used  Substance and Sexual Activity   Alcohol use: No    Comment: UTA   Drug use: No    Comment: UTA   Sexual activity: Not Currently    Birth control/protection: None  Other Topics Concern   Not on file  Social History Narrative   Not on file   Social Determinants of Health   Financial Resource Strain: Not on file  Food Insecurity: No Food Insecurity (01/09/2023)  Hunger Vital Sign    Worried About Running Out of Food in the Last Year: Never true    Ran Out of Food in the Last Year: Never true  Transportation Needs: No Transportation Needs (01/09/2023)   PRAPARE - Administrator, Civil Service (Medical): No    Lack of Transportation (Non-Medical): No  Physical Activity: Not on file  Stress: Not on file  Social Connections: Not on file  Intimate Partner Violence: Not At Risk (01/09/2023)   Humiliation, Afraid, Rape, and Kick questionnaire    Fear of Current or Ex-Partner: No    Emotionally Abused:  No    Physically Abused: No    Sexually Abused: No   Family History  Problem Relation Age of Onset   Hypertension Father    Colon cancer Neg Hx    Colon polyps Neg Hx       VITAL SIGNS BP (!) 100/56   Pulse (!) 57   Temp (!) 97.2 F (36.2 C)   Resp 18   Ht 5\' 6"  (1.676 m)   Wt 182 lb 6.4 oz (82.7 kg)   SpO2 99%   BMI 29.44 kg/m   Outpatient Encounter Medications as of 06/27/2023  Medication Sig   acetaminophen (TYLENOL) 325 MG tablet Take 650 mg by mouth every 4 (four) hours as needed. Every 4-6 hours as needed, not to exceed 3 grams in 24 hours   apixaban (ELIQUIS) 5 MG TABS tablet Take 5 mg by mouth 2 (two) times daily.    atorvastatin (LIPITOR) 20 MG tablet Take 20 mg by mouth at bedtime.   Cholecalciferol 1.25 MG (50000 UT) capsule Take 50,000 Units by mouth once a week. For Low Vitamin D Level on Monday   dapagliflozin propanediol (FARXIGA) 10 MG TABS tablet Take 10 mg by mouth daily.   Dextromethorphan-quiNIDine (NUEDEXTA) 20-10 MG capsule Take 1 capsule by mouth 2 (two) times daily.   ferrous sulfate 325 (65 FE) MG EC tablet Take 325 mg by mouth 2 (two) times daily.   gabapentin (NEURONTIN) 100 MG capsule Take 100 mg by mouth at bedtime.   levETIRAcetam (KEPPRA XR) 500 MG 24 hr tablet Take 500 mg by mouth daily. At bedtime 8 pm   loratadine (CLARITIN) 10 MG tablet Take 10 mg by mouth daily.   LORazepam (ATIVAN) 2 MG/ML injection Inject 1 mg into the vein every 15 (fifteen) minutes as needed for seizure (every 15 mins as needed up to 3 doses for seizure lasting longer than 2 minutes).   Menthol, Topical Analgesic, (BIOFREEZE) 4 % GEL Apply 1 application  topically at bedtime as needed. Apply to left shoulder   metFORMIN (GLUCOPHAGE-XR) 500 MG 24 hr tablet Take 500 mg by mouth daily with breakfast.   NON FORMULARY Diet:Regular   omeprazole (PRILOSEC) 40 MG capsule Take 1 capsule (40 mg total) by mouth daily.   polyethylene glycol (MIRALAX / GLYCOLAX) 17 g packet Take 17  g by mouth daily. 9 pm   QUEtiapine (SEROQUEL) 100 MG tablet Take 100 mg by mouth at bedtime.   QUEtiapine (SEROQUEL) 25 MG tablet Take 75 mg by mouth daily. At 10 am   sennosides-docusate sodium (SENOKOT-S) 8.6-50 MG tablet Take 1 tablet by mouth daily as needed for constipation.   ondansetron (ZOFRAN-ODT) 8 MG disintegrating tablet Take 8 mg by mouth every 6 (six) hours as needed for nausea or vomiting. (Patient not taking: Reported on 06/27/2023)   No facility-administered encounter medications on file as of 06/27/2023.  SIGNIFICANT DIAGNOSTIC EXAMS       ASSESSMENT/ PLAN:     Synthia Innocent NP [facility]  Contact [phone] Monday through Friday 8am- 5pm  After hours call [phone]

## 2023-06-28 ENCOUNTER — Ambulatory Visit (HOSPITAL_COMMUNITY)
Admission: RE | Admit: 2023-06-28 | Discharge: 2023-06-28 | Disposition: A | Discharge status: Home / Self Care | Payer: Medicaid Other | Source: Ambulatory Visit | Attending: Gastroenterology | Admitting: Gastroenterology

## 2023-06-28 DIAGNOSIS — K639 Disease of intestine, unspecified: Secondary | ICD-10-CM | POA: Diagnosis present

## 2023-06-28 MED ORDER — IOHEXOL 300 MG/ML  SOLN
100.0000 mL | Freq: Once | INTRAMUSCULAR | Status: AC | PRN
  Administered 2023-06-28: 100 mL via INTRAVENOUS

## 2023-07-05 ENCOUNTER — Encounter: Payer: Self-pay | Admitting: Internal Medicine

## 2023-07-05 ENCOUNTER — Non-Acute Institutional Stay (SKILLED_NURSING_FACILITY): Payer: Medicaid Other | Admitting: Internal Medicine

## 2023-07-05 DIAGNOSIS — E042 Nontoxic multinodular goiter: Secondary | ICD-10-CM

## 2023-07-05 DIAGNOSIS — I480 Paroxysmal atrial fibrillation: Secondary | ICD-10-CM

## 2023-07-05 DIAGNOSIS — I69354 Hemiplegia and hemiparesis following cerebral infarction affecting left non-dominant side: Secondary | ICD-10-CM | POA: Diagnosis not present

## 2023-07-05 DIAGNOSIS — D508 Other iron deficiency anemias: Secondary | ICD-10-CM

## 2023-07-05 DIAGNOSIS — E1151 Type 2 diabetes mellitus with diabetic peripheral angiopathy without gangrene: Secondary | ICD-10-CM | POA: Diagnosis not present

## 2023-07-05 NOTE — Progress Notes (Signed)
NURSING HOME LOCATION:  [facility] ROOM NUMBER:  [id number] D  CODE STATUS: DNR   PCP:  Synthia Innocent NP  This is a nursing facility follow up visit of chronic medical diagnoses & to document compliance with Regulation 483.30 (c) in The Long Term Care Survey Manual Phase 2 which mandates caregiver visit ( visits can alternate among physician, PA or NP as per statutes) within 10 days of 30 days / 60 days/ 90 days post admission to SNF date    Interim medical record and care since last SNF visit was updated with review of diagnostic studies and change in clinical status since last visit were documented.  HPI: He is permanent resident of this facility with medical diagnoses of history of atrial fibrillation, history of seizures, essential hypertension, history of stroke,PBA, dyslipidemia, and diabetes with vascular complications. Although indices are normocytic, hypochromic H/H was 13.0/45.2 on 9/4.  Ferritin level was low at 20.  A1c was 7.7%; goal would be less than 7% but he refuses insulin or other injectable therapy.  On 9/25 thyroid function test were therapeutic or normal. Creatinine has risen slightly to 1.00; but GFR remains greater than 60. On 10/10 PSA was performed and revealed a value of 3.15.  Review of systems: His only complaint is pain on the left side, "my whole left side" after he performs up to 60 laps around the facility hallways.  He propels himself backwards using his right lower extremity.  He describes the pain as sharp but responsive to Tylenol.  Constitutional: No fever, significant weight change, fatigue  Eyes: No redness, discharge, pain, vision change ENT/mouth: No nasal congestion,  purulent discharge, earache, change in hearing, sore throat  Cardiovascular: No chest pain, palpitations, paroxysmal nocturnal dyspnea, claudication, edema  Respiratory: No cough, sputum production, hemoptysis, DOE, significant snoring, apnea   Gastrointestinal: No  heartburn, dysphagia, abdominal pain, nausea /vomiting, rectal bleeding, melena, change in bowels Genitourinary: No dysuria, hematuria, pyuria, incontinence, nocturia Dermatologic: No rash, pruritus, change in appearance of skin Neurologic: No dizziness, headache, syncope, seizures, numbness, tingling Psychiatric: No significant anxiety, depression, insomnia, anorexia Endocrine: No change in hair/skin/nails, excessive thirst, excessive hunger, excessive urination  Hematologic/lymphatic: No significant bruising, lymphadenopathy, abnormal bleeding Allergy/immunology: No itchy/watery eyes, significant sneezing, urticaria, angioedema  Physical exam:  Pertinent or positive findings: I found him in the hallway performing laps, propelling himself backwards with his right lower extremity.  Hair is closely cropped.  He is Transport planner.  There was a elongated scar over the right crown.  Pattern alopecia is present.  He exhibits a slightly hyponasal, baby talk type of speech pattern.  There is slight ptosis of the right eye compared to the left.  He is edentulous.  There is a large goiter in the right anterior neck.  Rhythm is regular with slight slurring of second heart sound.  Pedal pulses are decreased.  Clubbing of the nailbeds is noted.  The left hand is clawed and he has left hemiparesis with no significant strength to opposition in the left upper or left lower extremities.  Strength of the right extremities is fair-good.  General appearance: Adequately nourished; no acute distress, increased work of breathing is present.   Lymphatic: No lymphadenopathy about the head, neck, axilla. Eyes: No conjunctival inflammation or lid edema is present. There is no scleral icterus. Ears:  External ear exam shows no significant lesions or deformities.   Nose:  External nasal examination shows no deformity or inflammation. Nasal mucosa are pink and  moist without lesions, exudates Neck:  No tenderness noted.    Heart:   Normal rate and regular rhythm without gallop, murmur, click, rub .  Lungs: Chest clear to auscultation without wheezes, rhonchi, rales, rubs. Abdomen: Bowel sounds are normal. Abdomen is soft and nontender with no organomegaly, hernias, masses. GU: Deferred  Extremities:  No cyanosis, edema  Neurologic exam :Balance, Rhomberg, finger to nose testing could not be completed due to clinical state Skin: Warm & dry w/o tenting. No significant lesions or rash.  See summary under each active problem in the Problem List with associated updated therapeutic plan

## 2023-07-05 NOTE — Assessment & Plan Note (Addendum)
There is slight slurring of S2, but rhythm is regular. Continue Eliquis.

## 2023-07-05 NOTE — Patient Instructions (Signed)
See assessment and plan under each diagnosis in the problem list and acutely for this visit 

## 2023-07-05 NOTE — Assessment & Plan Note (Deleted)
Serially the H/H has normalized and the ferritin increased from 11-20.  The indices are normocytic but hypochromia remains MCHC of 24.9, up from 21.9.  No bleeding or abrasions reported.  Continue to monitor.

## 2023-07-05 NOTE — Assessment & Plan Note (Signed)
Goiter is unchanged on exam.  Current thyroid function test are normal or therapeutic.  No change indicated; continue to monitor.  Endocrinology follow-up as scheduled.

## 2023-07-05 NOTE — Assessment & Plan Note (Addendum)
Left hemiparesis is unchanged; but he describes pain on the entire left side after he performs up to 60 laps around the hallways in the facility.  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  He does state that prn Tylenol helps the pain.

## 2023-07-05 NOTE — Assessment & Plan Note (Addendum)
Continue to monitor.  Rescheduling of CTE of the small bowel if anemia recurs as per  GI.

## 2023-07-05 NOTE — Assessment & Plan Note (Signed)
Current A1c is 7.7% on Farxiga and low-dose metformin.  He refuses any insulin injections.  GFR is greater than 60 so metformin dose could be increased.

## 2023-07-21 ENCOUNTER — Other Ambulatory Visit (HOSPITAL_COMMUNITY)
Admission: RE | Admit: 2023-07-21 | Discharge: 2023-07-21 | Disposition: A | Discharge status: Home / Self Care | Payer: Medicaid Other | Source: Skilled Nursing Facility | Attending: Adult Health | Admitting: Adult Health

## 2023-07-21 DIAGNOSIS — E1159 Type 2 diabetes mellitus with other circulatory complications: Secondary | ICD-10-CM | POA: Diagnosis present

## 2023-07-21 LAB — CBC WITH DIFFERENTIAL/PLATELET
Abs Immature Granulocytes: 0.03 10*3/uL (ref 0.00–0.07)
Basophils Absolute: 0.1 10*3/uL (ref 0.0–0.1)
Basophils Relative: 1 %
Eosinophils Absolute: 0.7 10*3/uL — ABNORMAL HIGH (ref 0.0–0.5)
Eosinophils Relative: 7 %
HCT: 48.9 % (ref 39.0–52.0)
Hemoglobin: 14.6 g/dL (ref 13.0–17.0)
Immature Granulocytes: 0 %
Lymphocytes Relative: 35 %
Lymphs Abs: 3.4 10*3/uL (ref 0.7–4.0)
MCH: 28.3 pg (ref 26.0–34.0)
MCHC: 29.9 g/dL — ABNORMAL LOW (ref 30.0–36.0)
MCV: 95 fL (ref 80.0–100.0)
Monocytes Absolute: 0.7 10*3/uL (ref 0.1–1.0)
Monocytes Relative: 8 %
Neutro Abs: 4.7 10*3/uL (ref 1.7–7.7)
Neutrophils Relative %: 49 %
Platelets: 248 10*3/uL (ref 150–400)
RBC: 5.15 MIL/uL (ref 4.22–5.81)
RDW: 14.6 % (ref 11.5–15.5)
WBC: 9.6 10*3/uL (ref 4.0–10.5)
nRBC: 0 % (ref 0.0–0.2)

## 2023-08-04 ENCOUNTER — Other Ambulatory Visit (HOSPITAL_COMMUNITY)
Admission: RE | Admit: 2023-08-04 | Discharge: 2023-08-04 | Disposition: A | Discharge status: Home / Self Care | Payer: Medicaid Other | Source: Skilled Nursing Facility | Attending: Adult Health | Admitting: Adult Health

## 2023-08-04 DIAGNOSIS — E1149 Type 2 diabetes mellitus with other diabetic neurological complication: Secondary | ICD-10-CM | POA: Diagnosis present

## 2023-08-04 LAB — BASIC METABOLIC PANEL
Anion gap: 7 (ref 5–15)
BUN: 15 mg/dL (ref 8–23)
CO2: 27 mmol/L (ref 22–32)
Calcium: 9.4 mg/dL (ref 8.9–10.3)
Chloride: 103 mmol/L (ref 98–111)
Creatinine, Ser: 0.81 mg/dL (ref 0.61–1.24)
GFR, Estimated: >60 mL/min (ref >60)
Glucose, Bld: 201 mg/dL — ABNORMAL HIGH (ref 70–99)
Potassium: 3.9 mmol/L (ref 3.5–5.1)
Sodium: 137 mmol/L (ref 135–145)

## 2023-08-11 ENCOUNTER — Encounter: Payer: Self-pay | Admitting: Adult Health

## 2023-08-11 ENCOUNTER — Non-Acute Institutional Stay (SKILLED_NURSING_FACILITY): Payer: Self-pay | Admitting: Adult Health

## 2023-08-11 DIAGNOSIS — E1151 Type 2 diabetes mellitus with diabetic peripheral angiopathy without gangrene: Secondary | ICD-10-CM

## 2023-08-11 DIAGNOSIS — E042 Nontoxic multinodular goiter: Secondary | ICD-10-CM

## 2023-08-11 DIAGNOSIS — E1149 Type 2 diabetes mellitus with other diabetic neurological complication: Secondary | ICD-10-CM | POA: Diagnosis not present

## 2023-08-11 DIAGNOSIS — I700 Atherosclerosis of aorta: Secondary | ICD-10-CM

## 2023-08-11 DIAGNOSIS — I69354 Hemiplegia and hemiparesis following cerebral infarction affecting left non-dominant side: Secondary | ICD-10-CM

## 2023-08-11 NOTE — Progress Notes (Signed)
 Location:  [facility] Room Number: 116 Place of Service:  SNF (31)   CODE STATUS: dnr   Allergies  Allergen Reactions   Cheese    Penicillins Hives    Has patient had a PCN reaction causing immediate rash, facial/tongue/throat swelling, SOB or lightheadedness with hypotension: Unknown Has patient had a PCN reaction causing severe rash involving mucus membranes or skin necrosis: Unknown Has patient had a PCN reaction that required hospitalization: No Has patient had a PCN reaction occurring within the last 10 years: No If all of the above answers are NO, then may proceed with Cephalosporin use.     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 He continues to get out of bed daily; does propel self around facility. There are no reports of recent verbal outbursts.   Past Medical History:  Diagnosis Date   A-fib [facility])    DM (diabetes mellitus) (HCC)    History of seizures    Hypertension    Stroke [facility])     Past Surgical History:  Procedure Laterality Date   CHOLECYSTECTOMY     COLONOSCOPY WITH PROPOFOL  N/A 01/11/2023   Procedure: COLONOSCOPY WITH PROPOFOL ;  Surgeon: Shaaron Lamar HERO, MD;  Location: [facility];  Service: Endoscopy;  Laterality: N/A;   CRANIECTOMY Right 04/01/2017   Procedure: RIGHT DECOMPRESSIVE CRANIECTOMY;  Surgeon: Ditty, Morene Hicks, MD;  Location: [facility] OR;  Service: Neurosurgery;  Laterality: Right;   ESOPHAGOGASTRODUODENOSCOPY N/A 04/13/2017    Procedure: ESOPHAGOGASTRODUODENOSCOPY (EGD);  Surgeon: Sebastian Moles, MD;  Location: [facility] ENDOSCOPY;  Service: General;  Laterality: N/A;  bedside   ESOPHAGOGASTRODUODENOSCOPY (EGD) WITH PROPOFOL  N/A 01/11/2023   Procedure: ESOPHAGOGASTRODUODENOSCOPY (EGD) WITH PROPOFOL ;  Surgeon: Shaaron Lamar HERO, MD;  Location: [facility];  Service: Endoscopy;  Laterality: N/A;   GIVENS CAPSULE STUDY N/A 05/15/2023   Procedure: GIVENS CAPSULE STUDY;  Surgeon: Shaaron Lamar HERO, MD;  Location: [facility];  Service: Endoscopy;  Laterality: N/A;  730am, at [facility]   INCISION AND DRAINAGE PERIRECTAL ABSCESS N/A 09/28/2017   Procedure: IRRIGATION AND DEBRIDEMENT PERIANAL ABSCESS;  Surgeon: Kallie Manuelita BROCKS, MD;  Location: [facility] ORS;  Service: General;  Laterality: N/A;   PEG PLACEMENT N/A 04/13/2017   Procedure: PERCUTANEOUS ENDOSCOPIC GASTROSTOMY (PEG) PLACEMENT;  Surgeon: Sebastian Moles, MD;  Location: [facility] ENDOSCOPY;  Service: General;  Laterality: N/A;   POLYPECTOMY  01/11/2023   Procedure: POLYPECTOMY INTESTINAL;  Surgeon: Shaaron Lamar HERO, MD;  Location: [facility];  Service: Endoscopy;;    Social History   Socioeconomic History   Marital status: Widowed    Spouse name: Not on file   Number of children: Not on file   Years of education: Not on file   Highest education level: Not on file  Occupational History   Not on file  Tobacco Use   Smoking status: Former    Types: Cigarettes   Smokeless tobacco: Never   Tobacco comments:    UTA  Vaping Use  Vaping status: Never Used  Substance and Sexual Activity   Alcohol use: No    Comment: UTA   Drug use: No    Comment: UTA   Sexual activity: Not Currently    Birth control/protection: None  Other Topics Concern   Not on file  Social History Narrative   Not on file   Social Drivers of Health   Financial Resource Strain: Not on file  Food Insecurity: No Food Insecurity (01/09/2023)   Hunger Vital Sign    Worried About Running Out of Food in  the Last Year: Never true    Ran Out of Food in the Last Year: Never true  Transportation Needs: No Transportation Needs (01/09/2023)   PRAPARE - Administrator, Civil Service (Medical): No    Lack of Transportation (Non-Medical): No  Physical Activity: Not on file  Stress: Not on file  Social Connections: Not on file  Intimate Partner Violence: Not At Risk (01/09/2023)   Humiliation, Afraid, Rape, and Kick questionnaire    Fear of Current or Ex-Partner: No    Emotionally Abused: No    Physically Abused: No    Sexually Abused: No   Family History  Problem Relation Age of Onset   Hypertension Father    Colon cancer Neg Hx    Colon polyps Neg Hx       VITAL SIGNS BP 136/82   Pulse 68   Temp (!) 97.1 F (36.2 C)   Resp 20   Ht 5' 7 (1.702 m)   Wt 181 lb 12.8 oz (82.5 kg)   SpO2 97%   BMI 28.47 kg/m   Outpatient Encounter Medications as of 08/11/2023  Medication Sig   acetaminophen  (TYLENOL ) 325 MG tablet Take 650 mg by mouth every 4 (four) hours as needed. Every 4-6 hours as needed, not to exceed 3 grams in 24 hours   apixaban  (ELIQUIS ) 5 MG TABS tablet Take 5 mg by mouth 2 (two) times daily.    atorvastatin  (LIPITOR) 20 MG tablet Take 20 mg by mouth at bedtime.   Cholecalciferol  1.25 MG (50000 UT) capsule Take 50,000 Units by mouth once a week. For Low Vitamin D  Level on Monday   dapagliflozin propanediol (FARXIGA) 10 MG TABS tablet Take 10 mg by mouth daily.   Dextromethorphan -quiNIDine (NUEDEXTA) 20-10 MG capsule Take 1 capsule by mouth 2 (two) times daily.   ferrous sulfate 325 (65 FE) MG EC tablet Take 325 mg by mouth 2 (two) times daily.   gabapentin  (NEURONTIN ) 100 MG capsule Take 100 mg by mouth at bedtime.   levETIRAcetam  (KEPPRA  XR) 500 MG 24 hr tablet Take 500 mg by mouth daily. At bedtime 8 pm   loratadine (CLARITIN) 10 MG tablet Take 10 mg by mouth daily.   LORazepam  (ATIVAN ) 2 MG/ML injection Inject 1 mg into the vein every 15 (fifteen) minutes as  needed for seizure (every 15 mins as needed up to 3 doses for seizure lasting longer than 2 minutes).   Menthol, Topical Analgesic, (BIOFREEZE) 4 % GEL Apply 1 application  topically at bedtime as needed. Apply to left shoulder   metFORMIN (GLUCOPHAGE-XR) 500 MG 24 hr tablet Take 500 mg by mouth daily with breakfast.   NON FORMULARY Diet:Regular   polyethylene glycol (MIRALAX  / GLYCOLAX ) 17 g packet Take 17 g by mouth daily. 9 pm   QUEtiapine  (SEROQUEL ) 100 MG tablet Take 100 mg by mouth at bedtime.   QUEtiapine  (SEROQUEL ) 25 MG tablet Take 75 mg by  mouth daily. At 10 am   sennosides-docusate sodium  (SENOKOT-S) 8.6-50 MG tablet Take 1 tablet by mouth daily as needed for constipation.   No facility-administered encounter medications on file as of 08/11/2023.     SIGNIFICANT DIAGNOSTIC EXAMS   PREVIOUS   06-10-21: thyroid  biopsy   01-11-23: EGD: normal esophagus; stomach, doudenal bulb and 2dn portion of duodenum; peg scar apparent   01-11-23: colonoscopy:  Two 3-5 mm polyp at hepatic flexure and in the cecum; removed with cold snare Diverticulosis in sigmoid colon and in the descending colon, redundant elongated colon Otherwise normal Doubt signs of GI bleed; may restart eliquis .   NO NEW EXAMS    LABS REVIEWED PREVIOUS:     08-31-22: tsh 2.255 free t3: 2.8; free t4: 0.82 09-29-22: ACR: 15 01-09-23: wbc 12.7; hgb 7.6; hct 28.5; mcv 75.4 plt 263; glucose 126; bun 13; creat 0.69; k+ 3.5; na++ 141; ca 9.0; gfr>60; protein 6.3 albumin 2.7 chol 99; ldl 46; trig 118; hdl 29; tsh 1.501 01-09-23 (#2); hgb 7.7; hct 28.9  01-10-23: wbc 13.0; hgb 7.7; hct 28.6; mcv 74.5; plt 268; hgb A1c 5.3; iron 18; tibc 374 HIV nr 01-11-23: wbc 11.1; hgb 8.2; hct 29.7 mcv 72.8 plt 272 01-12-23: wbc 12.9; hgb 8.4; hct 31.5; mcv 73.4 plt 291 01-16-23: wbc 12.3; hgb 7.5; hct 28.0; mcv 74.7 plt 286  01-19-23: hgb 8.3; hct 31.8 02-07-23: tsh 1.886 free t3: 2.6; free t4: 0.89 02-20-23: wbc 11.2; hgb 9.4; hct 34.6; mcv 80.7 plt  221; folate 10.1; vitamin B12: 613; iron 14 tibc 367; ferritin 11.  03-22-23: wbc 6.8; hgb 13.0; hct 44.5; mcv 82.7 plt clump; glucose 221; bun 8; creat 0.78; k+ 4.0; na++ 134; ca 8.7; gfr >60; d-dimer <0.27 04-17-23 hgb A1c 7.7   TODAY  05-18-23: PSA 3.15 07-21-23: wbc 9.6; hgb 14.6; hct 48.9; mcv 95.0 plt 248; glucose 201; bun 15; creat 0.91; k+ 3.9; na++ 137; ca 9.4 gfr >60   Review of Systems  Constitutional:  Negative for malaise/fatigue.  Respiratory:  Negative for cough and shortness of breath.   Cardiovascular:  Negative for chest pain, palpitations and leg swelling.  Gastrointestinal:  Negative for abdominal pain, constipation and heartburn.  Musculoskeletal:  Negative for back pain, joint pain and myalgias.  Skin: Negative.   Neurological:  Negative for dizziness.  Psychiatric/Behavioral:  The patient is not nervous/anxious.    Physical Exam Constitutional:      General: He is not in acute distress.    Appearance: He is well-developed. He is not diaphoretic.  Neck:     Thyroid : Thyroid  mass and thyromegaly present.  Cardiovascular:     Rate and Rhythm: Normal rate and regular rhythm.     Pulses: Normal pulses.     Heart sounds: Normal heart sounds.  Pulmonary:     Effort: Pulmonary effort is normal. No respiratory distress.     Breath sounds: Normal breath sounds.  Abdominal:     General: Bowel sounds are normal. There is no distension.     Palpations: Abdomen is soft.     Tenderness: There is no abdominal tenderness.  Musculoskeletal:     Cervical back: Neck supple.     Right lower leg: No edema.     Left lower leg: No edema.     Comments: Left hemiplegia    Lymphadenopathy:     Cervical: No cervical adenopathy.  Skin:    General: Skin is warm and dry.  Neurological:     Mental Status:  He is alert. Mental status is at baseline.     Comments: 03-24-23: SLUMS 17/30   Psychiatric:        Mood and Affect: Mood normal.      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 Multinodular goiter: is status post aspiration; has seen endocrinology 05-04-23.   3. Type 2 diabetes mellitus with neurological complications: hgb A1c 7.5; will continue metofrmon xr 1 gm daily   farxiga 10 mg daily   PREVIOUS   4. Hypertension associated with type 2 diabetes mellitus: b/p 136/82 is off medications  5. Delusions/paranoia will continue seroquel  75 mg in the AM and 100 mg in the PM   6. PAF (paroxsymal atrial fibrillation) heart rate is stable will continue eliquis  5 mg twice daily   7.  PBA: has fewer verbal out bursts will continue nuedexta twice daily   8. Hypochromic anemia: hgb 13.0   10. Protein calorie malnutrition severe: albumin 2.5 has declined prostat  11. Dyslipidemia associated with type 2 diabetes mellitus: ldl 68 will continue lipitor 20 mg daily   12. Diabetic peripheral neuropathy: will continue gabapentin  100 mg nightly   13. Seizure: no recent activity will continue keppra  xl 500 mg daily   14. Chronic constipation: will continue senna s daily as needed    15. GERD without esophagitis: will stop prilosec at this time   16. 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 Vitamin D  deficiency: 43.99; is off supplement    Barnie Seip NP [facility]   call [phone]

## 2023-08-14 ENCOUNTER — Other Ambulatory Visit (HOSPITAL_COMMUNITY)
Admission: RE | Admit: 2023-08-14 | Discharge: 2023-08-14 | Disposition: A | Discharge status: Home / Self Care | Payer: Medicaid Other | Source: Skilled Nursing Facility | Attending: Adult Health | Admitting: Adult Health

## 2023-08-14 DIAGNOSIS — E1149 Type 2 diabetes mellitus with other diabetic neurological complication: Secondary | ICD-10-CM | POA: Diagnosis present

## 2023-08-14 LAB — LIPID PANEL
Cholesterol: 98 mg/dL (ref 0–200)
HDL: 30 mg/dL — ABNORMAL LOW (ref >40)
LDL Cholesterol: 44 mg/dL (ref 0–99)
Total CHOL/HDL Ratio: 3.3 {ratio}
Triglycerides: 120 mg/dL (ref <150)
VLDL: 24 mg/dL (ref 0–40)

## 2023-08-15 LAB — HEMOGLOBIN A1C
Hgb A1c MFr Bld: 6.3 % — ABNORMAL HIGH (ref 4.8–5.6)
Mean Plasma Glucose: 134 mg/dL

## 2023-09-11 ENCOUNTER — Other Ambulatory Visit (HOSPITAL_COMMUNITY)
Admission: RE | Admit: 2023-09-11 | Discharge: 2023-09-11 | Disposition: A | Discharge status: Home / Self Care | Payer: Medicaid Other | Source: Skilled Nursing Facility | Attending: Adult Health | Admitting: Adult Health

## 2023-09-11 DIAGNOSIS — D509 Iron deficiency anemia, unspecified: Secondary | ICD-10-CM | POA: Insufficient documentation

## 2023-09-11 LAB — IRON AND TIBC
Iron: 88 ug/dL (ref 45–182)
Saturation Ratios: 29 % (ref 17.9–39.5)
TIBC: 300 ug/dL (ref 250–450)
UIBC: 212 ug/dL

## 2023-09-11 LAB — HEMOGLOBIN AND HEMATOCRIT, BLOOD
HCT: 50 % (ref 39.0–52.0)
Hemoglobin: 14.9 g/dL (ref 13.0–17.0)

## 2023-09-13 ENCOUNTER — Non-Acute Institutional Stay (SKILLED_NURSING_FACILITY): Payer: Self-pay | Admitting: Adult Health

## 2023-09-13 ENCOUNTER — Encounter: Payer: Self-pay | Admitting: Adult Health

## 2023-09-13 DIAGNOSIS — F22 Delusional disorders: Secondary | ICD-10-CM

## 2023-09-13 DIAGNOSIS — I480 Paroxysmal atrial fibrillation: Secondary | ICD-10-CM

## 2023-09-13 DIAGNOSIS — E1159 Type 2 diabetes mellitus with other circulatory complications: Secondary | ICD-10-CM | POA: Diagnosis not present

## 2023-09-13 DIAGNOSIS — I152 Hypertension secondary to endocrine disorders: Secondary | ICD-10-CM

## 2023-09-13 NOTE — Progress Notes (Signed)
 Location:  [facility] Room Number: 108 Place of Service:  SNF (31)   CODE STATUS: dnr   Allergies  Allergen Reactions   Cheese    Penicillins Hives    Has patient had a PCN reaction causing immediate rash, facial/tongue/throat swelling, SOB or lightheadedness with hypotension: Unknown Has patient had a PCN reaction causing severe rash involving mucus membranes or skin necrosis: Unknown Has patient had a PCN reaction that required hospitalization: No Has patient had a PCN reaction occurring within the last 10 years: No If all of the above answers are NO, then may proceed with Cephalosporin use.     Chief Complaint  Patient presents with   Medical Management of Chronic Issues       Hypertension associated with type 2 diabetes mellitus:  Delusions/paranoia  PAF (paroxysmal atrial fibrillation)      HPI:  He is a 63 year old long term resident of this facility being seen for the management of his chronic illnesses:  Hypertension associated with type 2 diabetes mellitus:  Delusions/paranoia  PAF (paroxysmal atrial fibrillation). No reports of palpitations. He rarely has a behavioral outburst. There are no reports of uncontrolled pain.   Past Medical History:  Diagnosis Date   A-fib [facility])    DM (diabetes mellitus) (HCC)    History of seizures    Hypertension    Stroke [facility])     Past Surgical History:  Procedure Laterality Date   CHOLECYSTECTOMY     COLONOSCOPY WITH PROPOFOL  N/A 01/11/2023   Procedure: COLONOSCOPY WITH PROPOFOL ;  Surgeon: Shaaron Lamar HERO, MD;  [facility] END[facility];  Service: Endoscopy;  Laterality: N/A;   CRANIECTOMY Right 04/01/2017   Procedure: RIGHT DECOMPRESSIVE CRANIECTOMY;  Surgeon: Ditty, Morene Hicks, MD;  Location: [facility] OR;  Service: Neurosurgery;  Laterality: Right;   ESOPHAGOGASTRODUODENOSCOPY N/A 04/13/2017   Procedure: ESOPHAGOGASTRODUODENOSCOPY (EGD);  Surgeon: Sebastian Moles, MD;  Location: [facility] ENDOSCOPY;  Service:  General;  Laterality: N/A;  bedside   ESOPHAGOGASTRODUODENOSCOPY (EGD) WITH PROPOFOL  N/A 01/11/2023   Procedure: ESOPHAGOGASTRODUODENOSCOPY (EGD) WITH PROPOFOL ;  Surgeon: Shaaron Lamar HERO, MD;  [facility]O [facility];  Service: Endoscopy;  Laterality: N/A;   GIVENS CAPSULE STUDY N/A 05/15/2023   Procedure: GIVENS CAPSULE STUDY;  Surgeon: Shaaron Lamar HERO, MD;  [facility]: [facility];  Service: Endoscopy;  Laterality: N/A;  730am, [facility]   INCISION AND DRAINAGE PERIRECTAL ABSCESS N/A 09/28/2017   Procedure: IRRIGATION AND DEBRIDEMENT PERIANAL ABSCESS;  Surgeon: Kallie Manuelita BROCKS, MD;  Location: [facility] ORS;  Service: General;  Laterality: N/A;   PEG PLACEMENT N/A 04/13/2017   Procedure: PERCUTANEOUS ENDOSCOPIC GASTROSTOMY (PEG) PLACEMENT;  Surgeon: Sebastian Moles, MD;  Location: [facility] ENDOSCOPY;  Service: General;  Laterality: N/A;   POLYPECTOMY  01/11/2023   Procedure: POLYPECTOMY INTESTINAL;  Surgeon: Shaaron Lamar HERO, MD;  [facility];  Service: Endoscopy;;    Social History   Socioeconomic History   Marital status: Widowed    Spouse name: Not on file   Number of children: Not on file   Years of education: Not on file   Highest education level: Not on file  Occupational History   Not on file  Tobacco Use   Smoking status: Former    Types: Cigarettes   Smokeless tobacco: Never   Tobacco comments:    UTA  Vaping Use   Vaping status: Never Used  Substance and Sexual Activity   Alcohol use: No    Comment: UTA  Drug use: No    Comment: UTA   Sexual activity: Not Currently    Birth control/protection: None  Other Topics Concern   Not on file  Social History Narrative   Not on file   Social Drivers of Health   Financial Resource Strain: Not on file  Food Insecurity: No Food Insecurity (01/09/2023)   Hunger Vital Sign    Worried About Running Out of Food in the Last Year: Never true    Ran Out of Food in the Last Year: Never true  Transportation Needs: No  Transportation Needs (01/09/2023)   PRAPARE - Administrator, Civil Service (Medical): No    Lack of Transportation (Non-Medical): No  Physical Activity: Not on file  Stress: Not on file  Social Connections: Not on file  Intimate Partner Violence: Not At Risk (01/09/2023)   Humiliation, Afraid, Rape, and Kick questionnaire    Fear of Current or Ex-Partner: No    Emotionally Abused: No    Physically Abused: No    Sexually Abused: No   Family History  Problem Relation Age of Onset   Hypertension Father    Colon cancer Neg Hx    Colon polyps Neg Hx       VITAL SIGNS BP 106/65   Pulse 74   Temp 98.6 F (37 C)   Resp (!) 22   Ht 5' 7 (1.702 m)   Wt 183 lb 6.4 oz (83.2 kg)   SpO2 97%   BMI 28.72 kg/m   Outpatient Encounter Medications as of 09/13/2023  Medication Sig   acetaminophen  (TYLENOL ) 325 MG tablet Take 650 mg by mouth every 4 (four) hours as needed. Every 4-6 hours as needed, not to exceed 3 grams in 24 hours   apixaban  (ELIQUIS ) 5 MG TABS tablet Take 5 mg by mouth 2 (two) times daily.    atorvastatin  (LIPITOR) 20 MG tablet Take 20 mg by mouth at bedtime.   Cholecalciferol  1.25 MG (50000 UT) capsule Take 50,000 Units by mouth once a week. For Low Vitamin D  Level on Monday   dapagliflozin propanediol (FARXIGA) 10 MG TABS tablet Take 10 mg by mouth daily.   Dextromethorphan -quiNIDine (NUEDEXTA) 20-10 MG capsule Take 1 capsule by mouth 2 (two) times daily.   ferrous sulfate 325 (65 FE) MG EC tablet Take 325 mg by mouth daily.   gabapentin  (NEURONTIN ) 100 MG capsule Take 100 mg by mouth at bedtime.   levETIRAcetam  (KEPPRA  XR) 500 MG 24 hr tablet Take 500 mg by mouth daily. At bedtime 8 pm   loratadine (CLARITIN) 10 MG tablet Take 10 mg by mouth daily.   LORazepam  (ATIVAN ) 2 MG/ML injection Inject 1 mg into the vein every 15 (fifteen) minutes as needed for seizure (every 15 mins as needed up to 3 doses for seizure lasting longer than 2 minutes).   Menthol, Topical  Analgesic, (BIOFREEZE) 4 % GEL Apply 1 application  topically at bedtime as needed. Apply to left shoulder   metFORMIN (GLUCOPHAGE-XR) 500 MG 24 hr tablet Take 500 mg by mouth daily with breakfast.   NON FORMULARY Diet:Regular   polyethylene glycol (MIRALAX  / GLYCOLAX ) 17 g packet Take 17 g by mouth daily. 9 pm   QUEtiapine  (SEROQUEL ) 100 MG tablet Take 100 mg by mouth at bedtime.   QUEtiapine  (SEROQUEL ) 25 MG tablet Take 75 mg by mouth daily. At 10 am   sennosides-docusate sodium  (SENOKOT-S) 8.6-50 MG tablet Take 1 tablet by mouth daily as needed for constipation.  No facility-administered encounter medications on file as of 09/13/2023.     SIGNIFICANT DIAGNOSTIC EXAMS  PREVIOUS   06-10-21: thyroid  biopsy   01-11-23: EGD: normal esophagus; stomach, doudenal bulb and 2dn portion of duodenum; peg scar apparent   01-11-23: colonoscopy:  Two 3-5 mm polyp at hepatic flexure and in the cecum; removed with cold snare Diverticulosis in sigmoid colon and in the descending colon, redundant elongated colon Otherwise normal Doubt signs of GI bleed; may restart eliquis .   NO NEW EXAMS    LABS REVIEWED PREVIOUS:     09-29-22: ACR: 15 01-09-23: wbc 12.7; hgb 7.6; hct 28.5; mcv 75.4 plt 263; glucose 126; bun 13; creat 0.69; k+ 3.5; na++ 141; ca 9.0; gfr>60; protein 6.3 albumin 2.7 chol 99; ldl 46; trig 118; hdl 29; tsh 1.501 01-09-23 (#2); hgb 7.7; hct 28.9  01-10-23: wbc 13.0; hgb 7.7; hct 28.6; mcv 74.5; plt 268; hgb A1c 5.3; iron 18; tibc 374 HIV nr 01-11-23: wbc 11.1; hgb 8.2; hct 29.7 mcv 72.8 plt 272 01-12-23: wbc 12.9; hgb 8.4; hct 31.5; mcv 73.4 plt 291 01-16-23: wbc 12.3; hgb 7.5; hct 28.0; mcv 74.7 plt 286  01-19-23: hgb 8.3; hct 31.8 02-07-23: tsh 1.886 free t3: 2.6; free t4: 0.89 02-20-23: wbc 11.2; hgb 9.4; hct 34.6; mcv 80.7 plt 221; folate 10.1; vitamin B12: 613; iron 14 tibc 367; ferritin 11.  03-22-23: wbc 6.8; hgb 13.0; hct 44.5; mcv 82.7 plt clump; glucose 221; bun 8; creat 0.78; k+ 4.0; na++  134; ca 8.7; gfr >60; d-dimer <0.27 04-17-23 hgb A1c 7.7  05-18-23: PSA 3.15 07-21-23: wbc 9.6; hgb 14.6; hct 48.9; mcv 95.0 plt 248; glucose 201; bun 15; creat 0.91; k+ 3.9; na++ 137; ca 9.4 gfr >60   TODAY  08-04-23: glucose 201; bun 15; creat 0.81; k+ 3.9; na++ 137; ca 9.4 gfr >60 08-14-23: chol 98; ldl 44 trig 120 hdl 30 hgb A1c 6.3 09-11-23: hgb 14.9; hct 50.0 iron 88; tibc 300   Review of Systems  Constitutional:  Negative for malaise/fatigue.  Respiratory:  Negative for cough and shortness of breath.   Cardiovascular:  Negative for chest pain, palpitations and leg swelling.  Gastrointestinal:  Negative for abdominal pain, constipation and heartburn.  Musculoskeletal:  Negative for back pain, joint pain and myalgias.  Skin: Negative.   Neurological:  Negative for dizziness.  Psychiatric/Behavioral:  The patient is not nervous/anxious.    Physical Exam Constitutional:      General: He is not in acute distress.    Appearance: He is well-developed. He is not diaphoretic.  Neck:     Thyroid : Thyroid  mass and thyromegaly present.  Cardiovascular:     Rate and Rhythm: Normal rate and regular rhythm.     Pulses: Normal pulses.     Heart sounds: Normal heart sounds.  Pulmonary:     Effort: Pulmonary effort is normal. No respiratory distress.     Breath sounds: Normal breath sounds.  Abdominal:     General: Bowel sounds are normal. There is no distension.     Palpations: Abdomen is soft.     Tenderness: There is no abdominal tenderness.  Musculoskeletal:     Cervical back: Neck supple.     Right lower leg: No edema.     Left lower leg: No edema.     Comments: Left hemiplegia     Lymphadenopathy:     Cervical: No cervical adenopathy.  Skin:    General: Skin is warm and dry.  Neurological:  Mental Status: He is alert. Mental status is at baseline.     Comments:  SLUMS 17/30  Psychiatric:        Mood and Affect: Mood normal.     ASSESSMENT/ PLAN:  TODAY;    Hypertension associated with type 2 diabetes mellitus: b/p 106/65 is presently off medications  2. Delusions/paranoia is on seroquel  75 mg in the AM and 100 mg nightly does have periodic outburst with cussing and people talking about him  3. PAF (paroxysmal atrial fibrillation) heart rate is stable will continue eliquis  5 mg twice daily   PREVIOUS   4.  PBA: has fewer verbal out bursts will continue nuedexta twice daily   5. Hypochromic anemia: hgb 14.9; no source hem positive stool   6. Protein calorie malnutrition severe: albumin 2.5 has declined prostat  7. Dyslipidemia associated with type 2 diabetes mellitus: ldl 44 will continue lipitor 20 mg daily   8. Diabetic peripheral neuropathy: will continue gabapentin  100 mg nightly   9. Seizure: no recent activity will continue keppra  xl 500 mg daily   10. Chronic constipation: will continue senna s daily as needed    11. GERD without esophagitis: is off PPI    12. 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 Vitamin D  deficiency: 43.99; is off supplement   14. 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 Multinodular goiter: is status post aspiration; has seen endocrinology 05-04-23.   16. Type 2 diabetes mellitus with neurological complications: hgb A1c 6.3; will continue metofrmon xr 1 gm daily   farxiga 10 mg daily       Joe Seip NP [facility]  call [phone]

## 2023-09-15 ENCOUNTER — Encounter: Payer: Self-pay | Admitting: Adult Health

## 2023-09-15 ENCOUNTER — Non-Acute Institutional Stay (SKILLED_NURSING_FACILITY): Payer: Medicaid Other | Admitting: Adult Health

## 2023-09-15 DIAGNOSIS — R569 Unspecified convulsions: Secondary | ICD-10-CM

## 2023-09-15 DIAGNOSIS — I152 Hypertension secondary to endocrine disorders: Secondary | ICD-10-CM | POA: Diagnosis not present

## 2023-09-15 DIAGNOSIS — F01B2 Vascular dementia, moderate, with psychotic disturbance: Secondary | ICD-10-CM

## 2023-09-15 DIAGNOSIS — E1159 Type 2 diabetes mellitus with other circulatory complications: Secondary | ICD-10-CM | POA: Diagnosis not present

## 2023-09-15 NOTE — Progress Notes (Signed)
 Location:  [facility] Room Number: 113 Place of Service:  SNF (31)   CODE STATUS: full   Allergies  Allergen Reactions   Cheese    Penicillins Hives    Has patient had a PCN reaction causing immediate rash, facial/tongue/throat swelling, SOB or lightheadedness with hypotension: Unknown Has patient had a PCN reaction causing severe rash involving mucus membranes or skin necrosis: Unknown Has patient had a PCN reaction that required hospitalization: No Has patient had a PCN reaction occurring within the last 10 years: No If all of the above answers are NO, then may proceed with Cephalosporin use.     Chief Complaint  Patient presents with   Acute Visit    Care plan meeting     HPI:  We have come together for his care plan meeting. BIMS 15/15 mood 0/30. He does cuss at staff and will decline showers he is nonambulatory without falls. He is dependent assist with his adls. He is frequently incontinent of bladder and bowel. Dietary: regular diet feeds self appetite 25-75%; weight is 181 pounds. Therapy: none at this time. He will continue to be followed for his chronic illnesses including:   Hypertension associated with type 2 diabetes mellitus    Moderate vascular dementia with psychotic disturbance    Seizure  Past Medical History:  Diagnosis Date   A-fib (HCC)    DM (diabetes mellitus) (HCC)    History of seizures    Hypertension    Stroke [facility])     Past Surgical History:  Procedure Laterality Date   CHOLECYSTECTOMY     COLONOSCOPY WITH PROPOFOL  N/A 01/11/2023   Procedure: COLONOSCOPY WITH PROPOFOL ;  Surgeon: Shaaron Lamar HERO, MD;  [facility];  Service: Endoscopy;  Laterality: N/A;   CRANIECTOMY Right 04/01/2017   Procedure: RIGHT DECOMPRESSIVE CRANIECTOMY;  Surgeon: Ditty, Morene Hicks, MD;  Location: [facility] OR;  Service: Neurosurgery;  Laterality: Right;   ESOPHAGOGASTRODUODENOSCOPY N/A 04/13/2017   Procedure: ESOPHAGOGASTRODUODENOSCOPY  (EGD);  Surgeon: Sebastian Moles, MD;  Location: [facility] ENDOSCOPY;  Service: General;  Laterality: N/A;  bedside   ESOPHAGOGASTRODUODENOSCOPY (EGD) WITH PROPOFOL  N/A 01/11/2023   Procedure: ESOPHAGOGASTRODUODENOSCOPY (EGD) WITH PROPOFOL ;  Surgeon: Shaaron Lamar HERO, MD;  [facility];  Service: Endoscopy;  Laterality: N/A;   GIVENS CAPSULE STUDY N/A 05/15/2023   Procedure: GIVENS CAPSULE STUDY;  Surgeon: Shaaron Lamar HERO, MD;  [facility];  Service: Endoscopy;  Laterality: N/A;  730am, [facility]   INCISION AND DRAINAGE PERIRECTAL ABSCESS N/A 09/28/2017   Procedure: IRRIGATION AND DEBRIDEMENT PERIANAL ABSCESS;  Surgeon: Kallie Manuelita BROCKS, MD;  Location: [facility] ORS;  Service: General;  Laterality: N/A;   PEG PLACEMENT N/A 04/13/2017   Procedure: PERCUTANEOUS ENDOSCOPIC GASTROSTOMY (PEG) PLACEMENT;  Surgeon: Sebastian Moles, MD;  Location: [facility] ENDOSCOPY;  Service: General;  Laterality: N/A;   POLYPECTOMY  01/11/2023   Procedure: POLYPECTOMY INTESTINAL;  Surgeon: Shaaron Lamar HERO, MD;  [facility];  Service: Endoscopy;;    Social History   Socioeconomic History   Marital status: Widowed    Spouse name: Not on file   Number of children: Not on file   Years of education: Not on file   Highest education level: Not on file  Occupational History   Not on file  Tobacco Use   Smoking status: Former    Types: Cigarettes   Smokeless tobacco: Never   Tobacco comments:    UTA  Vaping Use   Vaping status:  Never Used  Substance and Sexual Activity   Alcohol use: No    Comment: UTA   Drug use: No    Comment: UTA   Sexual activity: Not Currently    Birth control/protection: None  Other Topics Concern   Not on file  Social History Narrative   Not on file   Social Drivers of Health   Financial Resource Strain: Not on file  Food Insecurity: No Food Insecurity (01/09/2023)   Hunger Vital Sign    Worried About Running Out of Food in the Last Year: Never true    Ran Out  of Food in the Last Year: Never true  Transportation Needs: No Transportation Needs (01/09/2023)   PRAPARE - Administrator, Civil Service (Medical): No    Lack of Transportation (Non-Medical): No  Physical Activity: Not on file  Stress: Not on file  Social Connections: Not on file  Intimate Partner Violence: Not At Risk (01/09/2023)   Humiliation, Afraid, Rape, and Kick questionnaire    Fear of Current or Ex-Partner: No    Emotionally Abused: No    Physically Abused: No    Sexually Abused: No   Family History  Problem Relation Age of Onset   Hypertension Father    Colon cancer Neg Hx    Colon polyps Neg Hx       VITAL SIGNS BP (!) 110/56   Pulse 74   Temp 98.6 F (37 C)   Resp (!) 22   Ht 5' 7 (1.702 m)   Wt 183 lb 6.4 oz (83.2 kg)   SpO2 97%   BMI 28.72 kg/m   Outpatient Encounter Medications as of 09/15/2023  Medication Sig   acetaminophen  (TYLENOL ) 325 MG tablet Take 650 mg by mouth every 4 (four) hours as needed. Every 4-6 hours as needed, not to exceed 3 grams in 24 hours   apixaban  (ELIQUIS ) 5 MG TABS tablet Take 5 mg by mouth 2 (two) times daily.    atorvastatin  (LIPITOR) 20 MG tablet Take 20 mg by mouth at bedtime.   Cholecalciferol  1.25 MG (50000 UT) capsule Take 50,000 Units by mouth once a week. For Low Vitamin D  Level on Monday   dapagliflozin propanediol (FARXIGA) 10 MG TABS tablet Take 10 mg by mouth daily.   Dextromethorphan -quiNIDine (NUEDEXTA) 20-10 MG capsule Take 1 capsule by mouth 2 (two) times daily.   ferrous sulfate 325 (65 FE) MG EC tablet Take 325 mg by mouth daily.   gabapentin  (NEURONTIN ) 100 MG capsule Take 100 mg by mouth at bedtime.   levETIRAcetam  (KEPPRA  XR) 500 MG 24 hr tablet Take 500 mg by mouth daily. At bedtime 8 pm   loratadine (CLARITIN) 10 MG tablet Take 10 mg by mouth daily.   LORazepam  (ATIVAN ) 2 MG/ML injection Inject 1 mg into the vein every 15 (fifteen) minutes as needed for seizure (every 15 mins as needed up to 3  doses for seizure lasting longer than 2 minutes).   Menthol, Topical Analgesic, (BIOFREEZE) 4 % GEL Apply 1 application  topically at bedtime as needed. Apply to left shoulder   metFORMIN (GLUCOPHAGE-XR) 500 MG 24 hr tablet Take 500 mg by mouth daily with breakfast.   NON FORMULARY Diet:Regular   polyethylene glycol (MIRALAX  / GLYCOLAX ) 17 g packet Take 17 g by mouth daily. 9 pm   QUEtiapine  (SEROQUEL ) 100 MG tablet Take 100 mg by mouth at bedtime.   QUEtiapine  (SEROQUEL ) 25 MG tablet Take 75 mg by mouth daily. At 10  am   sennosides-docusate sodium  (SENOKOT-S) 8.6-50 MG tablet Take 1 tablet by mouth daily as needed for constipation.   No facility-administered encounter medications on file as of 09/15/2023.     SIGNIFICANT DIAGNOSTIC EXAMS  PREVIOUS   06-10-21: thyroid  biopsy   01-11-23: EGD: normal esophagus; stomach, doudenal bulb and 2dn portion of duodenum; peg scar apparent   01-11-23: colonoscopy:  Two 3-5 mm polyp at hepatic flexure and in the cecum; removed with cold snare Diverticulosis in sigmoid colon and in the descending colon, redundant elongated colon Otherwise normal Doubt signs of GI bleed; may restart eliquis .   NO NEW EXAMS    LABS REVIEWED PREVIOUS:     09-29-22: ACR: 15 01-09-23: wbc 12.7; hgb 7.6; hct 28.5; mcv 75.4 plt 263; glucose 126; bun 13; creat 0.69; k+ 3.5; na++ 141; ca 9.0; gfr>60; protein 6.3 albumin 2.7 chol 99; ldl 46; trig 118; hdl 29; tsh 1.501 01-09-23 (#2); hgb 7.7; hct 28.9  01-10-23: wbc 13.0; hgb 7.7; hct 28.6; mcv 74.5; plt 268; hgb A1c 5.3; iron 18; tibc 374 HIV nr 01-11-23: wbc 11.1; hgb 8.2; hct 29.7 mcv 72.8 plt 272 01-12-23: wbc 12.9; hgb 8.4; hct 31.5; mcv 73.4 plt 291 01-16-23: wbc 12.3; hgb 7.5; hct 28.0; mcv 74.7 plt 286  01-19-23: hgb 8.3; hct 31.8 02-07-23: tsh 1.886 free t3: 2.6; free t4: 0.89 02-20-23: wbc 11.2; hgb 9.4; hct 34.6; mcv 80.7 plt 221; folate 10.1; vitamin B12: 613; iron 14 tibc 367; ferritin 11.  03-22-23: wbc 6.8; hgb 13.0; hct  44.5; mcv 82.7 plt clump; glucose 221; bun 8; creat 0.78; k+ 4.0; na++ 134; ca 8.7; gfr >60; d-dimer <0.27 04-17-23 hgb A1c 7.7  05-18-23: PSA 3.15 07-21-23: wbc 9.6; hgb 14.6; hct 48.9; mcv 95.0 plt 248; glucose 201; bun 15; creat 0.91; k+ 3.9; na++ 137; ca 9.4 gfr >60   NO NEW LABS.   Review of Systems  Constitutional:  Negative for malaise/fatigue.  Respiratory:  Negative for cough and shortness of breath.   Cardiovascular:  Negative for chest pain, palpitations and leg swelling.  Gastrointestinal:  Negative for abdominal pain, constipation and heartburn.  Musculoskeletal:  Negative for back pain, joint pain and myalgias.  Skin: Negative.   Neurological:  Negative for dizziness.  Psychiatric/Behavioral:  The patient is not nervous/anxious.     Physical Exam Constitutional:      General: He is not in acute distress.    Appearance: He is well-developed. He is not diaphoretic.  Neck:     Thyroid : No thyromegaly.  Cardiovascular:     Rate and Rhythm: Normal rate and regular rhythm.     Heart sounds: Normal heart sounds.  Pulmonary:     Effort: Pulmonary effort is normal. No respiratory distress.     Breath sounds: Normal breath sounds.  Abdominal:     General: Bowel sounds are normal. There is no distension.     Palpations: Abdomen is soft.     Tenderness: There is no abdominal tenderness.  Musculoskeletal:     Cervical back: Neck supple.     Right lower leg: No edema.     Left lower leg: No edema.     Comments: Hemiplegia   Lymphadenopathy:     Cervical: No cervical adenopathy.  Skin:    General: Skin is warm and dry.  Neurological:     Mental Status: He is alert. Mental status is at baseline.     Comments: SLUMS 17/30  Psychiatric:  Mood and Affect: Mood normal.      ASSESSMENT/ PLAN:  TODAY  Hypertension associated with type 2 diabetes mellitus Moderate vascular dementia with psychotic disturbance Seizure  Will continue current medications Will  continue current plan of care Will continue to monitor his status.   Time spent with patient 40 minutes: medications; activities; diet.    Joe Seip NP [facility]   call [phone]

## 2023-10-17 ENCOUNTER — Other Ambulatory Visit (HOSPITAL_COMMUNITY)
Admission: RE | Admit: 2023-10-17 | Discharge: 2023-10-17 | Disposition: A | Discharge status: Home / Self Care | Source: Skilled Nursing Facility | Attending: Internal Medicine | Admitting: Internal Medicine

## 2023-10-17 ENCOUNTER — Non-Acute Institutional Stay (SKILLED_NURSING_FACILITY): Payer: Self-pay | Admitting: Internal Medicine

## 2023-10-17 ENCOUNTER — Encounter: Payer: Self-pay | Admitting: Internal Medicine

## 2023-10-17 DIAGNOSIS — I152 Hypertension secondary to endocrine disorders: Secondary | ICD-10-CM | POA: Insufficient documentation

## 2023-10-17 DIAGNOSIS — I480 Paroxysmal atrial fibrillation: Secondary | ICD-10-CM

## 2023-10-17 DIAGNOSIS — D509 Iron deficiency anemia, unspecified: Secondary | ICD-10-CM

## 2023-10-17 DIAGNOSIS — E782 Mixed hyperlipidemia: Secondary | ICD-10-CM | POA: Diagnosis not present

## 2023-10-17 DIAGNOSIS — K922 Gastrointestinal hemorrhage, unspecified: Secondary | ICD-10-CM

## 2023-10-17 DIAGNOSIS — E1149 Type 2 diabetes mellitus with other diabetic neurological complication: Secondary | ICD-10-CM | POA: Diagnosis not present

## 2023-10-17 LAB — BASIC METABOLIC PANEL
Anion gap: 7 (ref 5–15)
BUN: 12 mg/dL (ref 8–23)
CO2: 24 mmol/L (ref 22–32)
Calcium: 9.8 mg/dL (ref 8.9–10.3)
Chloride: 107 mmol/L (ref 98–111)
Creatinine, Ser: 0.92 mg/dL (ref 0.61–1.24)
GFR, Estimated: >60 mL/min (ref >60)
Glucose, Bld: 183 mg/dL — ABNORMAL HIGH (ref 70–99)
Potassium: 4.3 mmol/L (ref 3.5–5.1)
Sodium: 138 mmol/L (ref 135–145)

## 2023-10-17 NOTE — Assessment & Plan Note (Signed)
 On 09/11/2023 H/H was 14.9/50.0.  Iron panel was normal.  This indicates that the severe anemia previously present has resolved.  Specifically in July 2024 nadir H/H was 7.5/28.0.

## 2023-10-17 NOTE — Assessment & Plan Note (Signed)
 Diabetic control is excellent as manifested by an A1c of 6.3% on 08/14/2023.  No change in his current diabetic regimen indicated unless he exhibits hypoglycemia.

## 2023-10-17 NOTE — Assessment & Plan Note (Addendum)
 08/24/2023 LDL 44 and HDL 30.  LDL goal would be less than 70 with his history of CVA in the context of diabetes.  No change in statin therapy indicated.  Continue annual monitor.

## 2023-10-17 NOTE — Assessment & Plan Note (Signed)
 His nadir H/H was 7.5/28.0 in July 2024.  Anemia was completely resolved.  No bleeding dyscrasias reported by staff.  Continue to monitor.

## 2023-10-17 NOTE — Progress Notes (Signed)
 NURSING HOME LOCATION:  [facility] ROOM NUMBER:  [id number]  CODE STATUS:  Full Code  PCP: Sharee Holster, NP   This is a nursing facility follow up visit for of chronic medical diagnoses to document compliance with Regulation 483.30 (c) in The Long Term Care Survey Manual Phase 2 which mandates caregiver visit ( visits can alternate among physician, PA or NP as per statutes) within 10 days of 30 days / 60 days/ 90 days post admission to SNF date  .  Interim medical record and care since last SNF visit was updated with review of diagnostic studies and change in clinical status since last visit were documented.  HPI: He is a permanent resident of this facility with medical diagnoses of history of seizures, history of stroke with L hemiparesis, GERD,essential hypertension, dyslipidemia,paroxysmal atrial fibrillation, and diabetes with vascular complications.  Labs are sufficiently current.  On 08/04/2023 glucose was 201.  On 08/14/2023 LDL was 44 and HDL 30.  On 2/3 H/H was 14.9/50.0.  Iron panel was normal.  He previously was profoundly anemic in July 2024 with a nadir H/H of 7.5/28.0. Diabetic control is excellent as manifested by an A1c of 6.3% on 08/14/2023.  CKD stage II was present with a GFR greater than 60; discordance would not be expected with Stage 2 CKD. His TSH was therapeutic at 1.484 on 05/03/2023.  Serially it has remained in the therapeutic range dating back to June 2024.  Review of systems was totally negative; but his affect suggested disinterest. All responses were a mumbled "no" or negative shake of the head.  At bedside there were multiple high fructose corn syrup flavored snacks.  Constitutional: No fever, significant weight change, fatigue  Eyes: No redness, discharge, pain, vision change ENT/mouth: No nasal congestion,  purulent discharge, earache, change in hearing, sore throat  Cardiovascular: No chest pain, palpitations, paroxysmal nocturnal dyspnea,   edema  Respiratory: No cough, sputum production, hemoptysis, DOE, significant snoring, apnea   Gastrointestinal: No heartburn, dysphagia, abdominal pain, nausea /vomiting, rectal bleeding, melena, change in bowels Genitourinary: No dysuria, hematuria, pyuria, incontinence, nocturia Musculoskeletal: No joint stiffness, joint swelling, pain Dermatologic: No rash, pruritus, change in appearance of skin Neurologic: No dizziness, headache, syncope, seizures, numbness, tingling Psychiatric: No significant anxiety, depression, insomnia, anorexia Endocrine: No change in hair/skin/nails, excessive thirst, excessive hunger, excessive urination  Hematologic/lymphatic: No significant bruising, lymphadenopathy, abnormal bleeding Allergy/immunology: No itchy/watery eyes, significant sneezing, urticaria, angioedema  Physical exam:  Pertinent or positive findings: It was 11:45 AM but he was still asleep in bed.  He exhibited hypopnea without snoring or frank apnea.  He did arouse, but initially would not open his eyes.  As noted he was not engaged with the interview and exam.  He appears to be edentulous.  A large asymmetric goiter is present in the right anterior neck.  Breath sounds are decreased.  Heart rhythm was irregular.  Abdomen is protuberant.  The left dorsalis pedis pulse is stronger than the other pulses especially the posterior tibial pulses. There is trace edema at the sock line.  There is clawing of the left hand.  Slight clubbing of the nailbeds is suggested.  General appearance:  no acute distress, increased work of breathing is present.   Lymphatic: No lymphadenopathy about the head, neck, axilla. Eyes: No conjunctival inflammation or lid edema is present. There is no scleral icterus. Ears:  External ear exam shows no significant lesions or deformities.   Nose:  External nasal  examination shows no deformity or inflammation. Nasal mucosa are pink and moist without lesions, exudates Neck:  No  tenderness noted.    Heart:  No gallop, murmur, click, rub .  Lungs: Chest clear to auscultation without wheezes, rhonchi, rales, rubs. Abdomen: Bowel sounds are normal. Abdomen is soft and nontender with no organomegaly, hernias, masses. GU: Deferred  Extremities:  No cyanosis Neurologic exam :Balance, Rhomberg, finger to nose testing could not be completed due to clinical state Skin: Warm & dry w/o tenting. No significant lesions or rash.  See summary under each active problem in the Problem List with associated updated therapeutic plan

## 2023-10-17 NOTE — Patient Instructions (Signed)
 See assessment and plan under each diagnosis in the problem list and acutely for this visit

## 2023-10-17 NOTE — Assessment & Plan Note (Signed)
 Currently rhythm is irregular but rate well-controlled.  He is not on a rate controlling agent but is on prophylactic Eliquis which will be continued.

## 2023-11-20 ENCOUNTER — Encounter: Payer: Self-pay | Admitting: Adult Health

## 2023-11-20 ENCOUNTER — Non-Acute Institutional Stay (SKILLED_NURSING_FACILITY): Payer: Self-pay | Admitting: Adult Health

## 2023-11-20 DIAGNOSIS — D509 Iron deficiency anemia, unspecified: Secondary | ICD-10-CM

## 2023-11-20 DIAGNOSIS — E43 Unspecified severe protein-calorie malnutrition: Secondary | ICD-10-CM

## 2023-11-20 DIAGNOSIS — F482 Pseudobulbar affect: Secondary | ICD-10-CM | POA: Diagnosis not present

## 2023-11-20 NOTE — Progress Notes (Unsigned)
 Location:  [facility] Room Number: 111 Place of Service:  SNF (31)   CODE STATUS: dnr/dnh   Allergies  Allergen Reactions   Cheese    Penicillins Hives    Has patient had a PCN reaction causing immediate rash, facial/tongue/throat swelling, SOB or lightheadedness with hypotension: Unknown Has patient had a PCN reaction causing severe rash involving mucus membranes or skin necrosis: Unknown Has patient had a PCN reaction that required hospitalization: No Has patient had a PCN reaction occurring within the last 10 years: No If all of the above answers are "NO", then may proceed with Cephalosporin use.     Chief Complaint  Patient presents with   Medical Management of Chronic Issues     PBA (pseudobulbar affect)   Hypochromic anemia  Protein-calorie malnutrition, severe (HCC)              HPI:  He is a 63 y.o. long term resident of this facility being seen for the management of his chronic illnesses: PBA (pseudobulbar affect)   Hypochromic anemia  Protein-calorie malnutrition, severe (HCC). There are no reports of uncontrolled pain. There have been few verbal outbursts. His weight remains stable.    Past Medical History:  Diagnosis Date   A-fib [facility])    DM (diabetes mellitus) (HCC)    History of seizures    Hypertension    Stroke [facility])     Past Surgical History:  Procedure Laterality Date   CHOLECYSTECTOMY     COLONOSCOPY WITH PROPOFOL N/A 01/11/2023   Procedure: COLONOSCOPY WITH PROPOFOL;  Surgeon: Corbin Ade, MD;  Location: [facility];  Service: Endoscopy;  Laterality: N/A;   CRANIECTOMY Right 04/01/2017   Procedure: RIGHT DECOMPRESSIVE CRANIECTOMY;  Surgeon: Ditty, Loura Halt, MD;  Location: [facility] OR;  Service: Neurosurgery;  Laterality: Right;   ESOPHAGOGASTRODUODENOSCOPY N/A 04/13/2017   Procedure: ESOPHAGOGASTRODUODENOSCOPY (EGD);  Surgeon: Violeta Gelinas, MD;  Location: [facility] ENDOSCOPY;  Service: General;  Laterality: N/A;  bedside    ESOPHAGOGASTRODUODENOSCOPY (EGD) WITH PROPOFOL N/A 01/11/2023   Procedure: ESOPHAGOGASTRODUODENOSCOPY (EGD) WITH PROPOFOL;  Surgeon: Corbin Ade, MD;  [facility]: [facility];  Service: Endoscopy;  Laterality: N/A;   GIVENS CAPSULE STUDY N/A 05/15/2023   Procedure: GIVENS CAPSULE STUDY;  Surgeon: Corbin Ade, MD;  [facility]: [facility];  Service: Endoscopy;  Laterality: N/A;  730am, at [facility]   INCISION AND DRAINAGE PERIRECTAL ABSCESS N/A 09/28/2017   Procedure: IRRIGATION AND DEBRIDEMENT PERIANAL ABSCESS;  Surgeon: Lucretia Roers, MD;  Location: [facility] ORS;  Service: General;  Laterality: N/A;   PEG PLACEMENT N/A 04/13/2017   Procedure: PERCUTANEOUS ENDOSCOPIC GASTROSTOMY (PEG) PLACEMENT;  Surgeon: Violeta Gelinas, MD;  Location: [facility] ENDOSCOPY;  Service: General;  Laterality: N/A;   POLYPECTOMY  01/11/2023   Procedure: POLYPECTOMY INTESTINAL;  Surgeon: Corbin Ade, MD;  [facility]: [facility];  Service: Endoscopy;;    Social History   Socioeconomic History   Marital status: Widowed    Spouse name: Not on file   Number of children: Not on file   Years of education: Not on file   Highest education level: Not on file  Occupational History   Not on file  Tobacco Use   Smoking status: Former    Types: Cigarettes   Smokeless tobacco: Never   Tobacco comments:    UTA  Vaping Use   Vaping status: Never Used  Substance and Sexual Activity   Alcohol use: No    Comment: UTA  Drug use: No    Comment: UTA   Sexual activity: Not Currently    Birth control/protection: None  Other Topics Concern   Not on file  Social History Narrative   Not on file   Social Drivers of Health   Financial Resource Strain: Not on file  Food Insecurity: No Food Insecurity (01/09/2023)   Hunger Vital Sign    Worried About Running Out of Food in the Last Year: Never true    Ran Out of Food in the Last Year: Never true  Transportation Needs: No Transportation Needs (01/09/2023)   PRAPARE -  Administrator, Civil Service (Medical): No    Lack of Transportation (Non-Medical): No  Physical Activity: Not on file  Stress: Not on file  Social Connections: Not on file  Intimate Partner Violence: Not At Risk (01/09/2023)   Humiliation, Afraid, Rape, and Kick questionnaire    Fear of Current or Ex-Partner: No    Emotionally Abused: No    Physically Abused: No    Sexually Abused: No   Family History  Problem Relation Age of Onset   Hypertension Father    Colon cancer Neg Hx    Colon polyps Neg Hx       VITAL SIGNS BP 110/68   Pulse 78   Temp 97.9 F (36.6 C)   Resp 18   Ht 5\' 7"  (1.702 m)   Wt 186 lb 9.6 oz (84.6 kg)   SpO2 94%   BMI 29.23 kg/m   Outpatient Encounter Medications as of 11/20/2023  Medication Sig   acetaminophen (TYLENOL) 325 MG tablet Take 650 mg by mouth every 4 (four) hours as needed. Every 4-6 hours as needed, not to exceed 3 grams in 24 hours   apixaban (ELIQUIS) 5 MG TABS tablet Take 5 mg by mouth 2 (two) times daily.    atorvastatin (LIPITOR) 20 MG tablet Take 20 mg by mouth at bedtime.   Cholecalciferol 1.25 MG (50000 UT) capsule Take 50,000 Units by mouth once a week. For Low Vitamin D Level on Monday   dapagliflozin propanediol (FARXIGA) 10 MG TABS tablet Take 10 mg by mouth daily.   Dextromethorphan-quiNIDine (NUEDEXTA) 20-10 MG capsule Take 1 capsule by mouth 2 (two) times daily.   ferrous sulfate 325 (65 FE) MG EC tablet Take 325 mg by mouth daily.   gabapentin (NEURONTIN) 100 MG capsule Take 100 mg by mouth at bedtime.   levETIRAcetam (KEPPRA XR) 500 MG 24 hr tablet Take 500 mg by mouth daily. At bedtime 8 pm   loratadine (CLARITIN) 10 MG tablet Take 10 mg by mouth daily.   LORazepam (ATIVAN) 2 MG/ML injection Inject 1 mg into the vein every 15 (fifteen) minutes as needed for seizure (every 15 mins as needed up to 3 doses for seizure lasting longer than 2 minutes).   Menthol, Topical Analgesic, (BIOFREEZE) 4 % GEL Apply 1  application  topically at bedtime as needed. Apply to left shoulder   metFORMIN (GLUCOPHAGE-XR) 500 MG 24 hr tablet Take 500 mg by mouth daily with breakfast.   NON FORMULARY Diet:Regular   polyethylene glycol (MIRALAX / GLYCOLAX) 17 g packet Take 17 g by mouth daily. 9 pm   QUEtiapine (SEROQUEL) 100 MG tablet Take 100 mg by mouth at bedtime.   QUEtiapine (SEROQUEL) 25 MG tablet Take 75 mg by mouth daily. At 10 am   sennosides-docusate sodium (SENOKOT-S) 8.6-50 MG tablet Take 1 tablet by mouth daily as needed for constipation.  No facility-administered encounter medications on file as of 11/20/2023.     SIGNIFICANT DIAGNOSTIC EXAMS  PREVIOUS   06-10-21: thyroid biopsy   01-11-23: EGD: normal esophagus; stomach, doudenal bulb and 2dn portion of duodenum; peg scar apparent   01-11-23: colonoscopy:  Two 3-5 mm polyp at hepatic flexure and in the cecum; removed with cold snare Diverticulosis in sigmoid colon and in the descending colon, redundant elongated colon Otherwise normal Doubt signs of GI bleed; may restart eliquis.   NO NEW EXAMS    LABS REVIEWED PREVIOUS:     09-29-22: ACR: 15 01-09-23: wbc 12.7; hgb 7.6; hct 28.5; mcv 75.4 plt 263; glucose 126; bun 13; creat 0.69; k+ 3.5; na++ 141; ca 9.0; gfr>60; protein 6.3 albumin 2.7 chol 99; ldl 46; trig 118; hdl 29; tsh 1.501 01-09-23 (#2); hgb 7.7; hct 28.9  01-10-23: wbc 13.0; hgb 7.7; hct 28.6; mcv 74.5; plt 268; hgb A1c 5.3; iron 18; tibc 374 HIV nr 01-11-23: wbc 11.1; hgb 8.2; hct 29.7 mcv 72.8 plt 272 01-12-23: wbc 12.9; hgb 8.4; hct 31.5; mcv 73.4 plt 291 01-16-23: wbc 12.3; hgb 7.5; hct 28.0; mcv 74.7 plt 286  01-19-23: hgb 8.3; hct 31.8 02-07-23: tsh 1.886 free t3: 2.6; free t4: 0.89 02-20-23: wbc 11.2; hgb 9.4; hct 34.6; mcv 80.7 plt 221; folate 10.1; vitamin B12: 613; iron 14 tibc 367; ferritin 11.  03-22-23: wbc 6.8; hgb 13.0; hct 44.5; mcv 82.7 plt clump; glucose 221; bun 8; creat 0.78; k+ 4.0; na++ 134; ca 8.7; gfr >60; d-dimer  <0.27 04-17-23 hgb A1c 7.7  05-18-23: PSA 3.15 07-21-23: wbc 9.6; hgb 14.6; hct 48.9; mcv 95.0 plt 248; glucose 201; bun 15; creat 0.91; k+ 3.9; na++ 137; ca 9.4 gfr >60   TODAY  08-04-23: glucose 201; bun 15; creat 0.81; k+ 3.9; na++ 137; ca 9.4 gfr >60 08-14-23: chol 98; ldl 44 trig 120 hdl 30 hgb A1c 6.3 09-11-23: hgb 14.9; hct 50.0 iron 88; tibc 300  10-17-23: glucose 183; bun 12; creat 0.92; k+ 4.3; na++ 138; ca 9.8; gfr >60   Review of Systems  Constitutional:  Negative for malaise/fatigue.  Respiratory:  Negative for cough and shortness of breath.   Cardiovascular:  Negative for chest pain, palpitations and leg swelling.  Gastrointestinal:  Negative for abdominal pain, constipation and heartburn.  Musculoskeletal:  Negative for back pain, joint pain and myalgias.  Skin: Negative.   Neurological:  Negative for dizziness.  Psychiatric/Behavioral:  The patient is not nervous/anxious.    Physical Exam Constitutional:      General: He is not in acute distress.    Appearance: He is well-developed. He is not diaphoretic.  Neck:     Thyroid: Thyroid mass and thyromegaly present.  Cardiovascular:     Rate and Rhythm: Normal rate and regular rhythm.     Pulses: Normal pulses.     Heart sounds: Normal heart sounds.  Pulmonary:     Effort: Pulmonary effort is normal. No respiratory distress.     Breath sounds: Normal breath sounds.  Abdominal:     General: Bowel sounds are normal. There is no distension.     Palpations: Abdomen is soft.     Tenderness: There is no abdominal tenderness.  Musculoskeletal:     Cervical back: Neck supple.     Right lower leg: No edema.     Left lower leg: No edema.     Comments: Left hemiplegia   Lymphadenopathy:     Cervical: No cervical adenopathy.  Skin:  General: Skin is warm and dry.  Neurological:     Mental Status: He is alert. Mental status is at baseline.     Comments: SLUMS 17/30  Psychiatric:        Mood and Affect: Mood normal.       ASSESSMENT/ PLAN:  TODAY;   PBA (pseudobulbar affect) He has few verbal outburst with neudexta will continue this medication twice daily   Hypochromic anemia With hgb 14.9; his anemia has resolved; has a normal iron panel   Protein-calorie malnutrition, severe (HCC) His albumin is 2.5 he has declined supplements and prosource.    PREVIOUS   4. Dyslipidemia associated with type 2 diabetes mellitus: ldl 44 will continue lipitor 20 mg daily   5. Diabetic peripheral neuropathy: will continue gabapentin 100 mg nightly   6. Seizure: no recent activity will continue keppra xl 500 mg daily   7. Chronic constipation: will continue senna s daily as needed    8. GERD without esophagitis: is off PPI    9. 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 Vitamin D deficiency: 43.99; is off supplement   11. 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 Multinodular goiter: is status post aspiration; has seen endocrinology 05-04-23.   13. Type 2 diabetes mellitus with neurological complications: hgb A1c 6.3; will continue metofrmon xr 1 gm daily   farxiga 10 mg daily   14. Hypertension associated with type 2 diabetes mellitus: b/p 106/65 is presently off medications  15. Delusions/paranoia is on seroquel 75 mg in the AM and 100 mg nightly does have periodic outburst with cussing and people talking about him  16. PAF (paroxysmal atrial fibrillation) heart rate is stable will continue eliquis 5 mg twice daily    Britt Candle NP [facility]  call [phone]

## 2023-11-22 NOTE — Assessment & Plan Note (Addendum)
 His albumin is 2.5 he has declined supplements and prosource.

## 2023-11-22 NOTE — Assessment & Plan Note (Signed)
 He has few verbal outburst with neudexta will continue this medication twice daily

## 2023-11-22 NOTE — Assessment & Plan Note (Signed)
 With hgb 14.9; his anemia has resolved; has a normal iron panel

## 2023-11-25 ENCOUNTER — Encounter (HOSPITAL_COMMUNITY)
Admission: RE | Admit: 2023-11-25 | Discharge: 2023-11-25 | Disposition: A | Discharge status: Home / Self Care | Source: Skilled Nursing Facility | Attending: Adult Health | Admitting: Adult Health

## 2023-11-26 LAB — MICROALBUMIN / CREATININE URINE RATIO
Creatinine, Urine: 82.9 mg/dL
Microalb Creat Ratio: 21 mg/g{creat} (ref 0–29)
Microalb, Ur: 17 ug/mL — ABNORMAL HIGH

## 2023-12-15 ENCOUNTER — Non-Acute Institutional Stay (SKILLED_NURSING_FACILITY): Payer: Self-pay | Admitting: Adult Health

## 2023-12-15 ENCOUNTER — Encounter: Payer: Self-pay | Admitting: Adult Health

## 2023-12-15 DIAGNOSIS — R569 Unspecified convulsions: Secondary | ICD-10-CM

## 2023-12-15 DIAGNOSIS — I152 Hypertension secondary to endocrine disorders: Secondary | ICD-10-CM

## 2023-12-15 DIAGNOSIS — E1159 Type 2 diabetes mellitus with other circulatory complications: Secondary | ICD-10-CM

## 2023-12-15 DIAGNOSIS — I700 Atherosclerosis of aorta: Secondary | ICD-10-CM

## 2023-12-15 NOTE — Progress Notes (Signed)
 Location:  [facility] Room Number: 113 D Place of Service:  SNF (31)   CODE STATUS: DNR  Allergies  Allergen Reactions   Cheese    Penicillins Hives    Has patient had a PCN reaction causing immediate rash, facial/tongue/throat swelling, SOB or lightheadedness with hypotension: Unknown Has patient had a PCN reaction causing severe rash involving mucus membranes or skin necrosis: Unknown Has patient had a PCN reaction that required hospitalization: No Has patient had a PCN reaction occurring within the last 10 years: No If all of the above answers are "NO", then may proceed with Cephalosporin use.     Chief Complaint  Patient presents with   Care Plan Meeting     HPI:  We have come together for his care plan meeting. BIMS 15/15 mood 0/30: can be verbally aggressive with staff. He is using wheelchair without falls. He requires dependent assist with his adl care. He is incontinent of bladder and bowel. Dietary: feeds self; regular diet appetite >50%; weight is 186.6 pounds. Therapy: none at this time: BCAT 30/50. Activities: does attend. He will continue to be followed for his chronic illnesses including: Hypertension associated with type 2 diabetes mellitus Aortic atherosclerosis  Seizure  Past Medical History:  Diagnosis Date   A-fib (HCC)    DM (diabetes mellitus) (HCC)    History of seizures    Hypertension    Stroke [facility])     Past Surgical History:  Procedure Laterality Date   CHOLECYSTECTOMY     COLONOSCOPY WITH PROPOFOL  N/A 01/11/2023   Procedure: COLONOSCOPY WITH PROPOFOL ;  Surgeon: Suzette Espy, MD;  [facility]: [facility] ENDO [facility];  Service: Endoscopy;  Laterality: N/A;   CRANIECTOMY Right 04/01/2017   Procedure: RIGHT DECOMPRESSIVE CRANIECTOMY;  Surgeon: Ditty, Raelene Bullocks, MD;  Location: [facility] OR;  Service: Neurosurgery;  Laterality: Right;   ESOPHAGOGASTRODUODENOSCOPY N/A 04/13/2017   Procedure: ESOPHAGOGASTRODUODENOSCOPY (EGD);  Surgeon:  Dorena Gander, MD;  Location: [facility] ENDOSCOPY;  Service: General;  Laterality: N/A;  bedside   ESOPHAGOGASTRODUODENOSCOPY (EGD) WITH PROPOFOL  N/A 01/11/2023   Procedure: ESOPHAGOGASTRODUODENOSCOPY (EGD) WITH PROPOFOL ;  Surgeon: Suzette Espy, MD;  [facility];  Service: Endoscopy;  Laterality: N/A;   GIVENS CAPSULE STUDY N/A 05/15/2023   Procedure: GIVENS CAPSULE STUDY;  Surgeon: Suzette Espy, MD;  [facility]: [facility];  Service: Endoscopy;  Laterality: N/A;  730am, [facility]   INCISION AND DRAINAGE PERIRECTAL ABSCESS N/A 09/28/2017   Procedure: IRRIGATION AND DEBRIDEMENT PERIANAL ABSCESS;  Surgeon: Awilda Bogus, MD;  Location: [facility] ORS;  Service: General;  Laterality: N/A;   PEG PLACEMENT N/A 04/13/2017   Procedure: PERCUTANEOUS ENDOSCOPIC GASTROSTOMY (PEG) PLACEMENT;  Surgeon: Dorena Gander, MD;  Location: [facility] ENDOSCOPY;  Service: General;  Laterality: N/A;   POLYPECTOMY  01/11/2023   Procedure: POLYPECTOMY INTESTINAL;  Surgeon: Suzette Espy, MD;  [facility];  Service: Endoscopy;;    Social History   Socioeconomic History   Marital status: Widowed    Spouse name: Not on file   Number of children: Not on file   Years of education: Not on file   Highest education level: Not on file  Occupational History   Not on file  Tobacco Use   Smoking status: Former    Types: Cigarettes   Smokeless tobacco: Never   Tobacco comments:    UTA  Vaping Use   Vaping status: Never Used  Substance and Sexual Activity   Alcohol use: No  Comment: UTA   Drug use: No    Comment: UTA   Sexual activity: Not Currently    Birth control/protection: None  Other Topics Concern   Not on file  Social History Narrative   Not on file   Social Drivers of Health   Financial Resource Strain: Not on file  Food Insecurity: No Food Insecurity (01/09/2023)   Hunger Vital Sign    Worried About Running Out of Food in the Last Year: Never true    Ran Out of Food in the  Last Year: Never true  Transportation Needs: No Transportation Needs (01/09/2023)   PRAPARE - Administrator, Civil Service (Medical): No    Lack of Transportation (Non-Medical): No  Physical Activity: Not on file  Stress: Not on file  Social Connections: Not on file  Intimate Partner Violence: Not At Risk (01/09/2023)   Humiliation, Afraid, Rape, and Kick questionnaire    Fear of Current or Ex-Partner: No    Emotionally Abused: No    Physically Abused: No    Sexually Abused: No   Family History  Problem Relation Age of Onset   Hypertension Father    Colon cancer Neg Hx    Colon polyps Neg Hx       VITAL SIGNS BP (!) 100/51 Comment: collected by facility staff  Pulse (!) 58 Comment: collected by facility staff  Temp 98.4 F (36.9 C)   Ht 5\' 7"  (1.702 m)   Wt 186 lb 6.4 oz (84.6 kg)   SpO2 94%   BMI 29.19 kg/m   Outpatient Encounter Medications as of 12/15/2023  Medication Sig   guaiFENesin -dextromethorphan  (ROBITUSSIN DM) 100-10 MG/5ML syrup Take 15 mLs by mouth every 6 (six) hours as needed for cough.   acetaminophen  (TYLENOL ) 325 MG tablet Take 650 mg by mouth every 4 (four) hours as needed. Every 4-6 hours as needed, not to exceed 3 grams in 24 hours   apixaban  (ELIQUIS ) 5 MG TABS tablet Take 5 mg by mouth 2 (two) times daily.    atorvastatin  (LIPITOR) 20 MG tablet Take 20 mg by mouth at bedtime.   Cholecalciferol  1.25 MG (50000 UT) capsule Take 50,000 Units by mouth once a week. For Low Vitamin D  Level on Monday   dapagliflozin propanediol (FARXIGA) 10 MG TABS tablet Take 10 mg by mouth daily.   Dextromethorphan -quiNIDine (NUEDEXTA) 20-10 MG capsule Take 1 capsule by mouth 2 (two) times daily.   ferrous sulfate 325 (65 FE) MG EC tablet Take 325 mg by mouth daily.   gabapentin  (NEURONTIN ) 100 MG capsule Take 100 mg by mouth 2 (two) times daily.   levETIRAcetam  (KEPPRA  XR) 500 MG 24 hr tablet Take 500 mg by mouth daily. At bedtime 8 pm   loratadine (CLARITIN) 10  MG tablet Take 10 mg by mouth daily.   LORazepam  (ATIVAN ) 2 MG/ML injection Inject 1 mg into the vein every 15 (fifteen) minutes as needed for seizure (every 15 mins as needed up to 3 doses for seizure lasting longer than 2 minutes).   Menthol, Topical Analgesic, (BIOFREEZE) 4 % GEL Apply 1 application  topically at bedtime as needed. Apply to left shoulder   metFORMIN (GLUCOPHAGE-XR) 500 MG 24 hr tablet Take 500 mg by mouth daily with breakfast.   NON FORMULARY Diet:Regular   polyethylene glycol (MIRALAX  / GLYCOLAX ) 17 g packet Take 17 g by mouth daily. 9 pm   QUEtiapine  (SEROQUEL ) 100 MG tablet Take 100 mg by mouth at bedtime.   QUEtiapine  (  SEROQUEL ) 25 MG tablet Take 75 mg by mouth daily. At 10 am   sennosides-docusate sodium  (SENOKOT-S) 8.6-50 MG tablet Take 1 tablet by mouth daily as needed for constipation.   No facility-administered encounter medications on file as of 12/15/2023.     SIGNIFICANT DIAGNOSTIC EXAMS  PREVIOUS   06-10-21: thyroid  biopsy   01-11-23: EGD: normal esophagus; stomach, doudenal bulb and 2dn portion of duodenum; peg scar apparent   01-11-23: colonoscopy:  Two 3-5 mm polyp at hepatic flexure and in the cecum; removed with cold snare Diverticulosis in sigmoid colon and in the descending colon, redundant elongated colon Otherwise normal Doubt signs of GI bleed; may restart eliquis .   NO NEW EXAMS    LABS REVIEWED PREVIOUS:     01-09-23: wbc 12.7; hgb 7.6; hct 28.5; mcv 75.4 plt 263; glucose 126; bun 13; creat 0.69; k+ 3.5; na++ 141; ca 9.0; gfr>60; protein 6.3 albumin 2.7 chol 99; ldl 46; trig 118; hdl 29; tsh 1.501 01-09-23 (#2); hgb 7.7; hct 28.9  01-10-23: wbc 13.0; hgb 7.7; hct 28.6; mcv 74.5; plt 268; hgb A1c 5.3; iron 18; tibc 374 HIV nr 01-11-23: wbc 11.1; hgb 8.2; hct 29.7 mcv 72.8 plt 272 01-12-23: wbc 12.9; hgb 8.4; hct 31.5; mcv 73.4 plt 291 01-16-23: wbc 12.3; hgb 7.5; hct 28.0; mcv 74.7 plt 286  01-19-23: hgb 8.3; hct 31.8 02-07-23: tsh 1.886 free t3: 2.6;  free t4: 0.89 02-20-23: wbc 11.2; hgb 9.4; hct 34.6; mcv 80.7 plt 221; folate 10.1; vitamin B12: 613; iron 14 tibc 367; ferritin 11.  03-22-23: wbc 6.8; hgb 13.0; hct 44.5; mcv 82.7 plt clump; glucose 221; bun 8; creat 0.78; k+ 4.0; na++ 134; ca 8.7; gfr >60; d-dimer <0.27 04-17-23 hgb A1c 7.7  05-18-23: PSA 3.15 07-21-23: wbc 9.6; hgb 14.6; hct 48.9; mcv 95.0 plt 248; glucose 201; bun 15; creat 0.91; k+ 3.9; na++ 137; ca 9.4 gfr >60  08-04-23: glucose 201; bun 15; creat 0.81; k+ 3.9; na++ 137; ca 9.4 gfr >60 08-14-23: chol 98; ldl 44 trig 120 hdl 30 hgb A1c 6.3 09-11-23: hgb 14.9; hct 50.0 iron 88; tibc 300  10-17-23: glucose 183; bun 12; creat 0.92; k+ 4.3; na++ 138; ca 9.8; gfr >60  NO NEW LABS.    Review of Systems  Constitutional:  Negative for malaise/fatigue.  Respiratory:  Negative for cough and shortness of breath.   Cardiovascular:  Negative for chest pain, palpitations and leg swelling.  Gastrointestinal:  Negative for abdominal pain, constipation and heartburn.  Musculoskeletal:  Negative for back pain, joint pain and myalgias.  Skin: Negative.   Neurological:  Negative for dizziness.  Psychiatric/Behavioral:  The patient is not nervous/anxious.    Physical Exam Constitutional:      General: He is not in acute distress.    Appearance: He is well-developed. He is not diaphoretic.  Neck:     Thyroid : Thyroid  mass and thyromegaly present.  Cardiovascular:     Rate and Rhythm: Normal rate and regular rhythm.     Pulses: Normal pulses.     Heart sounds: Normal heart sounds.  Pulmonary:     Effort: Pulmonary effort is normal. No respiratory distress.     Breath sounds: Normal breath sounds.  Abdominal:     General: Bowel sounds are normal. There is no distension.     Palpations: Abdomen is soft.     Tenderness: There is no abdominal tenderness.  Musculoskeletal:     Cervical back: Neck supple.     Right lower  leg: No edema.     Left lower leg: No edema.     Comments: Left  hemiplegia  Lymphadenopathy:     Cervical: No cervical adenopathy.  Skin:    General: Skin is warm and dry.  Neurological:     Mental Status: He is alert. Mental status is at baseline.     Comments: SLUMS 17/30  Psychiatric:        Mood and Affect: Mood normal.      ASSESSMENT/ PLAN:  TODAY  Hypertension associated with type 2 diabetes mellitus Aortic atherosclerosis Seizure  Will continue current medications Will continue current plan of care Will continue to monitor his status.   Time spent with patient 40 minutes: dietary; medications; plan of care.    Britt Candle NP [facility]   call [phone]

## 2023-12-26 ENCOUNTER — Non-Acute Institutional Stay (SKILLED_NURSING_FACILITY): Payer: Self-pay | Admitting: Adult Health

## 2023-12-26 ENCOUNTER — Encounter: Payer: Self-pay | Admitting: Adult Health

## 2023-12-26 DIAGNOSIS — E1169 Type 2 diabetes mellitus with other specified complication: Secondary | ICD-10-CM

## 2023-12-26 DIAGNOSIS — R569 Unspecified convulsions: Secondary | ICD-10-CM | POA: Diagnosis not present

## 2023-12-26 DIAGNOSIS — E785 Hyperlipidemia, unspecified: Secondary | ICD-10-CM | POA: Diagnosis not present

## 2023-12-26 DIAGNOSIS — E1142 Type 2 diabetes mellitus with diabetic polyneuropathy: Secondary | ICD-10-CM | POA: Diagnosis not present

## 2023-12-26 NOTE — Progress Notes (Signed)
 Location:  [facility] Room Number: 113D Place of Service:  SNF (31)   CODE STATUS: DNR  Allergies  Allergen Reactions   Cheese    Penicillins Hives    Has patient had a PCN reaction causing immediate rash, facial/tongue/throat swelling, SOB or lightheadedness with hypotension: Unknown Has patient had a PCN reaction causing severe rash involving mucus membranes or skin necrosis: Unknown Has patient had a PCN reaction that required hospitalization: No Has patient had a PCN reaction occurring within the last 10 years: No If all of the above answers are "NO", then may proceed with Cephalosporin use.     Chief Complaint  Patient presents with   Medical Management of Chronic Issues          Dyslipidemia associated with type 2 diabetes mellitus:  Diabetic peripheral neuropathy: Seizures:     HPI:  He is a 63 y.o. long term resident of this facility being seen for the management of his chronic illnesses:  Dyslipidemia associated with type 2 diabetes mellitus:  Diabetic peripheral neuropathy: Seizures. There has been no seizure activity. There are no reports of uncontrolled pain. No reports of heart burn.    Past Medical History:  Diagnosis Date   A-fib [facility])    DM (diabetes mellitus) (HCC)    History of seizures    Hypertension    Stroke [facility])     Past Surgical History:  Procedure Laterality Date   CHOLECYSTECTOMY     COLONOSCOPY WITH PROPOFOL  N/A 01/11/2023   Procedure: COLONOSCOPY WITH PROPOFOL ;  Surgeon: Suzette Espy, MD;  [facility];  Service: Endoscopy;  Laterality: N/A;   CRANIECTOMY Right 04/01/2017   Procedure: RIGHT DECOMPRESSIVE CRANIECTOMY;  Surgeon: Ditty, Raelene Bullocks, MD;  Location: [facility] OR;  Service: Neurosurgery;  Laterality: Right;   ESOPHAGOGASTRODUODENOSCOPY N/A 04/13/2017   Procedure: ESOPHAGOGASTRODUODENOSCOPY (EGD);  Surgeon: Dorena Gander, MD;  Location: [facility] ENDOSCOPY;  Service: General;  Laterality: N/A;  bedside    ESOPHAGOGASTRODUODENOSCOPY (EGD) WITH PROPOFOL  N/A 01/11/2023   Procedure: ESOPHAGOGASTRODUODENOSCOPY (EGD) WITH PROPOFOL ;  Surgeon: Suzette Espy, MD;  [facility];  Service: Endoscopy;  Laterality: N/A;   GIVENS CAPSULE STUDY N/A 05/15/2023   Procedure: GIVENS CAPSULE STUDY;  Surgeon: Suzette Espy, MD;  [facility];  Service: Endoscopy;  Laterality: N/A;  730am, [facility]   INCISION AND DRAINAGE PERIRECTAL ABSCESS N/A 09/28/2017   Procedure: IRRIGATION AND DEBRIDEMENT PERIANAL ABSCESS;  Surgeon: Awilda Bogus, MD;  Location: [facility] ORS;  Service: General;  Laterality: N/A;   PEG PLACEMENT N/A 04/13/2017   Procedure: PERCUTANEOUS ENDOSCOPIC GASTROSTOMY (PEG) PLACEMENT;  Surgeon: Dorena Gander, MD;  Location: [facility] ENDOSCOPY;  Service: General;  Laterality: N/A;   POLYPECTOMY  01/11/2023   Procedure: POLYPECTOMY INTESTINAL;  Surgeon: Suzette Espy, MD;  [facility];  Service: Endoscopy;;    Social History   Socioeconomic History   Marital status: Widowed    Spouse name: Not on file   Number of children: Not on file   Years of education: Not on file   Highest education level: Not on file  Occupational History   Not on file  Tobacco Use   Smoking status: Former    Types: Cigarettes   Smokeless tobacco: Never   Tobacco comments:    UTA  Vaping Use   Vaping status: Never Used  Substance and Sexual Activity   Alcohol use: No    Comment: UTA   Drug use: No  Comment: UTA   Sexual activity: Not Currently    Birth control/protection: None  Other Topics Concern   Not on file  Social History Narrative   Not on file   Social Drivers of Health   Financial Resource Strain: Not on file  Food Insecurity: No Food Insecurity (01/09/2023)   Hunger Vital Sign    Worried About Running Out of Food in the Last Year: Never true    Ran Out of Food in the Last Year: Never true  Transportation Needs: No Transportation Needs (01/09/2023)   PRAPARE -  Administrator, Civil Service (Medical): No    Lack of Transportation (Non-Medical): No  Physical Activity: Not on file  Stress: Not on file  Social Connections: Not on file  Intimate Partner Violence: Not At Risk (01/09/2023)   Humiliation, Afraid, Rape, and Kick questionnaire    Fear of Current or Ex-Partner: No    Emotionally Abused: No    Physically Abused: No    Sexually Abused: No   Family History  Problem Relation Age of Onset   Hypertension Father    Colon cancer Neg Hx    Colon polyps Neg Hx       VITAL SIGNS BP 91/64   Pulse 67   Temp 98.3 F (36.8 C)   Resp (!) 22   Ht 5\' 7"  (1.702 m)   Wt 186 lb 6.4 oz (84.6 kg)   SpO2 96%   BMI 29.19 kg/m   Outpatient Encounter Medications as of 12/26/2023  Medication Sig   acetaminophen  (TYLENOL ) 325 MG tablet Take 650 mg by mouth every 4 (four) hours as needed. Every 4-6 hours as needed, not to exceed 3 grams in 24 hours   apixaban  (ELIQUIS ) 5 MG TABS tablet Take 5 mg by mouth 2 (two) times daily.    atorvastatin  (LIPITOR) 20 MG tablet Take 20 mg by mouth at bedtime.   Cholecalciferol  1.25 MG (50000 UT) capsule Take 50,000 Units by mouth every Monday. For Low Vitamin D  Level on Monday   dapagliflozin propanediol (FARXIGA) 10 MG TABS tablet Take 10 mg by mouth daily.   Dextromethorphan -quiNIDine (NUEDEXTA) 20-10 MG capsule Take 1 capsule by mouth 2 (two) times daily.   ferrous sulfate 325 (65 FE) MG EC tablet Take 325 mg by mouth daily.   gabapentin  (NEURONTIN ) 100 MG capsule Take 100 mg by mouth 2 (two) times daily.   levETIRAcetam  (KEPPRA  XR) 500 MG 24 hr tablet Take 500 mg by mouth daily. At bedtime 8 pm   loratadine (CLARITIN) 10 MG tablet Take 10 mg by mouth daily.   LORazepam  (ATIVAN ) 2 MG/ML injection Inject 1 mg into the vein every 15 (fifteen) minutes as needed for seizure (every 15 mins as needed up to 3 doses for seizure lasting longer than 2 minutes).   Menthol, Topical Analgesic, (BIOFREEZE) 4 % GEL  Apply 1 application  topically at bedtime as needed. Apply to left shoulder   metFORMIN (GLUCOPHAGE-XR) 500 MG 24 hr tablet Take 500 mg by mouth daily with breakfast.   NON FORMULARY Diet:Regular   polyethylene glycol (MIRALAX  / GLYCOLAX ) 17 g packet Take 17 g by mouth daily. 9 pm   QUEtiapine  (SEROQUEL ) 100 MG tablet Take 100 mg by mouth at bedtime.   QUEtiapine  (SEROQUEL ) 25 MG tablet Take 75 mg by mouth daily. At 10 am   sennosides-docusate sodium  (SENOKOT-S) 8.6-50 MG tablet Take 1 tablet by mouth daily as needed for constipation.   guaiFENesin -dextromethorphan  (ROBITUSSIN DM) 100-10  MG/5ML syrup Take 15 mLs by mouth every 6 (six) hours as needed for cough. (Patient not taking: Reported on 12/26/2023)   No facility-administered encounter medications on file as of 12/26/2023.     SIGNIFICANT DIAGNOSTIC EXAMS   PREVIOUS   06-10-21: thyroid  biopsy   01-11-23: EGD: normal esophagus; stomach, doudenal bulb and 2dn portion of duodenum; peg scar apparent   01-11-23: colonoscopy:  Two 3-5 mm polyp at hepatic flexure and in the cecum; removed with cold snare Diverticulosis in sigmoid colon and in the descending colon, redundant elongated colon Otherwise normal Doubt signs of GI bleed; may restart eliquis .   NO NEW EXAMS    LABS REVIEWED PREVIOUS:     01-09-23: wbc 12.7; hgb 7.6; hct 28.5; mcv 75.4 plt 263; glucose 126; bun 13; creat 0.69; k+ 3.5; na++ 141; ca 9.0; gfr>60; protein 6.3 albumin 2.7 chol 99; ldl 46; trig 118; hdl 29; tsh 1.501 01-09-23 (#2); hgb 7.7; hct 28.9  01-10-23: wbc 13.0; hgb 7.7; hct 28.6; mcv 74.5; plt 268; hgb A1c 5.3; iron 18; tibc 374 HIV nr 01-11-23: wbc 11.1; hgb 8.2; hct 29.7 mcv 72.8 plt 272 01-12-23: wbc 12.9; hgb 8.4; hct 31.5; mcv 73.4 plt 291 01-16-23: wbc 12.3; hgb 7.5; hct 28.0; mcv 74.7 plt 286  01-19-23: hgb 8.3; hct 31.8 02-07-23: tsh 1.886 free t3: 2.6; free t4: 0.89 02-20-23: wbc 11.2; hgb 9.4; hct 34.6; mcv 80.7 plt 221; folate 10.1; vitamin B12: 613; iron 14  tibc 367; ferritin 11.  03-22-23: wbc 6.8; hgb 13.0; hct 44.5; mcv 82.7 plt clump; glucose 221; bun 8; creat 0.78; k+ 4.0; na++ 134; ca 8.7; gfr >60; d-dimer <0.27 04-17-23 hgb A1c 7.7  05-18-23: PSA 3.15 07-21-23: wbc 9.6; hgb 14.6; hct 48.9; mcv 95.0 plt 248; glucose 201; bun 15; creat 0.91; k+ 3.9; na++ 137; ca 9.4 gfr >60  08-04-23: glucose 201; bun 15; creat 0.81; k+ 3.9; na++ 137; ca 9.4 gfr >60 08-14-23: chol 98; ldl 44 trig 120 hdl 30 hgb A1c 6.3 09-11-23: hgb 14.9; hct 50.0 iron 88; tibc 300  10-17-23: glucose 183; bun 12; creat 0.92; k+ 4.3; na++ 138; ca 9.8; gfr >60  NO NEW LABS.    Review of Systems  Constitutional:  Negative for malaise/fatigue.  Respiratory:  Negative for cough and shortness of breath.   Cardiovascular:  Negative for chest pain, palpitations and leg swelling.  Gastrointestinal:  Negative for abdominal pain, constipation and heartburn.  Musculoskeletal:  Negative for back pain, joint pain and myalgias.  Skin: Negative.   Neurological:  Negative for dizziness.  Psychiatric/Behavioral:  The patient is not nervous/anxious.    Physical Exam Constitutional:      General: He is not in acute distress.    Appearance: He is well-developed. He is not diaphoretic.  Neck:     Thyroid : Thyroid  mass and thyromegaly present.  Cardiovascular:     Rate and Rhythm: Normal rate and regular rhythm.     Pulses: Normal pulses.     Heart sounds: Normal heart sounds.  Pulmonary:     Effort: Pulmonary effort is normal. No respiratory distress.     Breath sounds: Normal breath sounds.  Abdominal:     General: Bowel sounds are normal. There is no distension.     Palpations: Abdomen is soft.     Tenderness: There is no abdominal tenderness.  Musculoskeletal:     Cervical back: Neck supple.     Comments: Left hemiplegia  Lymphadenopathy:     Cervical: No  cervical adenopathy.  Skin:    General: Skin is warm and dry.  Neurological:     Mental Status: He is alert. Mental status  is at baseline.     Comments: SLUMS 17/30  Psychiatric:        Mood and Affect: Mood normal.    ASSESSMENT/ PLAN:  TODAY;  Dyslipidemia associated with type 2 diabetes mellitus: ldl 44 will continue lipitor 20 mg daily   2. Diabetic peripheral neuropathy: will continue gabapentin  100 mg nightly  3. Seizures: no recent activities: will continue keppra  xl 500 mg daily    PREVIOUS   4. Chronic constipation: will continue senna s daily as needed    5. GERD without esophagitis: is off PPI    6. 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 Vitamin D  deficiency: 43.99; is off supplement   8. 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 Multinodular goiter: is status post aspiration; has seen endocrinology 05-04-23.   10. Type 2 diabetes mellitus with neurological complications: hgb A1c 6.3; will continue metofrmon xr 1 gm daily   farxiga 10 mg daily   11. Hypertension associated with type 2 diabetes mellitus: b/p 106/65 is presently off medications  12. Delusions/paranoia is on seroquel  75 mg in the AM and 100 mg nightly does have periodic outburst with cussing and people talking about him  13. PAF (paroxysmal atrial fibrillation) heart rate is stable will continue eliquis  5 mg twice daily   14 .PBA (pseudobulbar affect) He has few verbal outburst with neudexta will continue this medication twice daily   15. Hypochromic anemia With hgb 14.9; his anemia has resolved; has a normal iron panel   16. Protein-calorie malnutrition, severe (HCC) His albumin is 2.5 he has declined supplements and prosource.    Will check cbc; cmp; tsh hgb A1c    Britt Candle NP [facility]   call [phone]

## 2023-12-28 ENCOUNTER — Other Ambulatory Visit (HOSPITAL_COMMUNITY)
Admission: RE | Admit: 2023-12-28 | Discharge: 2023-12-28 | Disposition: A | Discharge status: Home / Self Care | Source: Skilled Nursing Facility | Attending: Adult Health | Admitting: Adult Health

## 2023-12-28 DIAGNOSIS — E1149 Type 2 diabetes mellitus with other diabetic neurological complication: Secondary | ICD-10-CM | POA: Diagnosis present

## 2023-12-28 LAB — COMPREHENSIVE METABOLIC PANEL WITH GFR
ALT: 12 U/L (ref 0–44)
AST: 10 U/L — ABNORMAL LOW (ref 15–41)
Albumin: 3 g/dL — ABNORMAL LOW (ref 3.5–5.0)
Alkaline Phosphatase: 62 U/L (ref 38–126)
Anion gap: 8 (ref 5–15)
BUN: 12 mg/dL (ref 8–23)
CO2: 22 mmol/L (ref 22–32)
Calcium: 8.7 mg/dL — ABNORMAL LOW (ref 8.9–10.3)
Chloride: 106 mmol/L (ref 98–111)
Creatinine, Ser: 0.65 mg/dL (ref 0.61–1.24)
GFR, Estimated: >60 mL/min (ref >60)
Glucose, Bld: 139 mg/dL — ABNORMAL HIGH (ref 70–99)
Potassium: 3.7 mmol/L (ref 3.5–5.1)
Sodium: 136 mmol/L (ref 135–145)
Total Bilirubin: 0.4 mg/dL (ref 0.0–1.2)
Total Protein: 6.7 g/dL (ref 6.5–8.1)

## 2023-12-28 LAB — HEMOGLOBIN A1C
Hgb A1c MFr Bld: 5.5 % (ref 4.8–5.6)
Mean Plasma Glucose: 111.15 mg/dL

## 2023-12-28 LAB — T4, FREE: Free T4: 0.78 ng/dL (ref 0.61–1.12)

## 2023-12-28 LAB — CBC
HCT: 44.6 % (ref 39.0–52.0)
Hemoglobin: 13 g/dL (ref 13.0–17.0)
MCH: 27.5 pg (ref 26.0–34.0)
MCHC: 29.1 g/dL — ABNORMAL LOW (ref 30.0–36.0)
MCV: 94.3 fL (ref 80.0–100.0)
Platelets: 214 10*3/uL (ref 150–400)
RBC: 4.73 MIL/uL (ref 4.22–5.81)
RDW: 14.8 % (ref 11.5–15.5)
WBC: 11.9 10*3/uL — ABNORMAL HIGH (ref 4.0–10.5)
nRBC: 0 % (ref 0.0–0.2)

## 2023-12-28 LAB — TSH: TSH: 2.575 u[IU]/mL (ref 0.350–4.500)

## 2023-12-29 LAB — T3, FREE: T3, Free: 2.8 pg/mL (ref 2.0–4.4)

## 2024-02-01 ENCOUNTER — Non-Acute Institutional Stay (SKILLED_NURSING_FACILITY): Payer: Self-pay | Admitting: Adult Health

## 2024-02-01 DIAGNOSIS — K219 Gastro-esophageal reflux disease without esophagitis: Secondary | ICD-10-CM | POA: Diagnosis not present

## 2024-02-01 DIAGNOSIS — K5909 Other constipation: Secondary | ICD-10-CM

## 2024-02-01 DIAGNOSIS — Z8673 Personal history of transient ischemic attack (TIA), and cerebral infarction without residual deficits: Secondary | ICD-10-CM

## 2024-02-01 NOTE — Progress Notes (Signed)
 Location:  [facility] Room Number: 113D Place of Service:  SNF (31)   CODE STATUS: DNR  Allergies  Allergen Reactions   Cheese    Penicillins Hives    Has patient had a PCN reaction causing immediate rash, facial/tongue/throat swelling, SOB or lightheadedness with hypotension: Unknown Has patient had a PCN reaction causing severe rash involving mucus membranes or skin necrosis: Unknown Has patient had a PCN reaction that required hospitalization: No Has patient had a PCN reaction occurring within the last 10 years: No If all of the above answers are NO, then may proceed with Cephalosporin use.     Chief Complaint  Patient presents with   Medical Management of Chronic Issues           Chronic constipation:  GERD without esophagitis: History of CVA:    HPI:  He is a 63 y.o. long term resident of this facility being seen for the management of his chronic illnesses:Chronic constipation:  GERD without esophagitis: History of CVA. There are no reports of uncontrolled pain. There are no reports of recent behavioral outbursts. His weight remains stable.    Past Medical History:  Diagnosis Date   A-fib [facility])    DM (diabetes mellitus) (HCC)    History of seizures    Hypertension    Stroke [facility])     Past Surgical History:  Procedure Laterality Date   CHOLECYSTECTOMY     COLONOSCOPY WITH PROPOFOL  N/A 01/11/2023   Procedure: COLONOSCOPY WITH PROPOFOL ;  Surgeon: Shaaron Lamar HERO, MD;  [facility]: [facility];  Service: Endoscopy;  Laterality: N/A;   CRANIECTOMY Right 04/01/2017   Procedure: RIGHT DECOMPRESSIVE CRANIECTOMY;  Surgeon: Ditty, Morene Hicks, MD;  Location: [facility] OR;  Service: Neurosurgery;  Laterality: Right;   ESOPHAGOGASTRODUODENOSCOPY N/A 04/13/2017   Procedure: ESOPHAGOGASTRODUODENOSCOPY (EGD);  Surgeon: Sebastian Moles, MD;  Location: [facility] ENDOSCOPY;  Service: General;  Laterality: N/A;  bedside   ESOPHAGOGASTRODUODENOSCOPY (EGD) WITH PROPOFOL   N/A 01/11/2023   Procedure: ESOPHAGOGASTRODUODENOSCOPY (EGD) WITH PROPOFOL ;  Surgeon: Shaaron Lamar HERO, MD;  [facility]: [facility];  Service: Endoscopy;  Laterality: N/A;   GIVENS CAPSULE STUDY N/A 05/15/2023   Procedure: GIVENS CAPSULE STUDY;  Surgeon: Shaaron Lamar HERO, MD;  Location: [facility];  Service: Endoscopy;  Laterality: N/A;  730am, at [facility]   INCISION AND DRAINAGE PERIRECTAL ABSCESS N/A 09/28/2017   Procedure: IRRIGATION AND DEBRIDEMENT PERIANAL ABSCESS;  Surgeon: Kallie Manuelita BROCKS, MD;  Location: [facility] ORS;  Service: General;  Laterality: N/A;   PEG PLACEMENT N/A 04/13/2017   Procedure: PERCUTANEOUS ENDOSCOPIC GASTROSTOMY (PEG) PLACEMENT;  Surgeon: Sebastian Moles, MD;  Location: [facility] ENDOSCOPY;  Service: General;  Laterality: N/A;   POLYPECTOMY  01/11/2023   Procedure: POLYPECTOMY INTESTINAL;  Surgeon: Shaaron Lamar HERO, MD;  Location: [facility];  Service: Endoscopy;;    Social History   Socioeconomic History   Marital status: Widowed    Spouse name: Not on file   Number of children: Not on file   Years of education: Not on file   Highest education level: Not on file  Occupational History   Not on file  Tobacco Use   Smoking status: Former    Types: Cigarettes   Smokeless tobacco: Never   Tobacco comments:    UTA  Vaping Use   Vaping status: Never Used  Substance and Sexual Activity   Alcohol use: No    Comment: UTA   Drug use: No    Comment: UTA  Sexual activity: Not Currently    Birth control/protection: None  Other Topics Concern   Not on file  Social History Narrative   Not on file   Social Drivers of Health   Financial Resource Strain: Not on file  Food Insecurity: No Food Insecurity (01/09/2023)   Hunger Vital Sign    Worried About Running Out of Food in the Last Year: Never true    Ran Out of Food in the Last Year: Never true  Transportation Needs: No Transportation Needs (01/09/2023)   PRAPARE - Administrator, Civil Service  (Medical): No    Lack of Transportation (Non-Medical): No  Physical Activity: Not on file  Stress: Not on file  Social Connections: Not on file  Intimate Partner Violence: Not At Risk (01/09/2023)   Humiliation, Afraid, Rape, and Kick questionnaire    Fear of Current or Ex-Partner: No    Emotionally Abused: No    Physically Abused: No    Sexually Abused: No   Family History  Problem Relation Age of Onset   Hypertension Father    Colon cancer Neg Hx    Colon polyps Neg Hx       VITAL SIGNS BP 110/74   Pulse 74   Temp 98.6 F (37 C)   Wt 180 lb 6.4 oz (81.8 kg)   SpO2 98%   BMI 28.25 kg/m   Outpatient Encounter Medications as of 02/01/2024  Medication Sig   acetaminophen  (TYLENOL ) 325 MG tablet Take 650 mg by mouth every 4 (four) hours as needed. Every 4-6 hours as needed, not to exceed 3 grams in 24 hours   apixaban  (ELIQUIS ) 5 MG TABS tablet Take 5 mg by mouth 2 (two) times daily.    atorvastatin  (LIPITOR) 20 MG tablet Take 20 mg by mouth at bedtime.   Cholecalciferol  1.25 MG (50000 UT) capsule Take 50,000 Units by mouth every Monday. For Low Vitamin D  Level on Monday   dapagliflozin propanediol (FARXIGA) 10 MG TABS tablet Take 10 mg by mouth daily.   Dextromethorphan -quiNIDine (NUEDEXTA) 20-10 MG capsule Take 1 capsule by mouth 2 (two) times daily.   ferrous sulfate 325 (65 FE) MG EC tablet Take 325 mg by mouth daily.   gabapentin  (NEURONTIN ) 100 MG capsule Take 100 mg by mouth 2 (two) times daily.   levETIRAcetam  (KEPPRA  XR) 500 MG 24 hr tablet Take 500 mg by mouth daily. At bedtime 8 pm   loratadine (CLARITIN) 10 MG tablet Take 10 mg by mouth daily.   LORazepam  (ATIVAN ) 2 MG/ML injection Inject 1 mg into the vein every 15 (fifteen) minutes as needed for seizure (every 15 mins as needed up to 3 doses for seizure lasting longer than 2 minutes).   Menthol, Topical Analgesic, (BIOFREEZE) 4 % GEL Apply 1 application  topically at bedtime as needed. Apply to left shoulder    metFORMIN (GLUCOPHAGE-XR) 500 MG 24 hr tablet Take 500 mg by mouth daily with breakfast.   polyethylene glycol (MIRALAX  / GLYCOLAX ) 17 g packet Take 17 g by mouth daily. 9 pm   QUEtiapine  (SEROQUEL ) 100 MG tablet Take 100 mg by mouth at bedtime.   QUEtiapine  (SEROQUEL ) 25 MG tablet Take 75 mg by mouth daily. At 10 am   sennosides-docusate sodium  (SENOKOT-S) 8.6-50 MG tablet Take 1 tablet by mouth daily as needed for constipation.   guaiFENesin -dextromethorphan  (ROBITUSSIN DM) 100-10 MG/5ML syrup Take 15 mLs by mouth every 6 (six) hours as needed for cough. (Patient not taking: Reported on 02/01/2024)  NON FORMULARY Diet:Regular (Patient not taking: Reported on 02/01/2024)   No facility-administered encounter medications on file as of 02/01/2024.     SIGNIFICANT DIAGNOSTIC EXAMS  PREVIOUS   06-10-21: thyroid  biopsy   01-11-23: EGD: normal esophagus; stomach, doudenal bulb and 2dn portion of duodenum; peg scar apparent   01-11-23: colonoscopy:  Two 3-5 mm polyp at hepatic flexure and in the cecum; removed with cold snare Diverticulosis in sigmoid colon and in the descending colon, redundant elongated colon Otherwise normal Doubt signs of GI bleed; may restart eliquis .   NO NEW EXAMS    LABS REVIEWED PREVIOUS:     01-09-23: wbc 12.7; hgb 7.6; hct 28.5; mcv 75.4 plt 263; glucose 126; bun 13; creat 0.69; k+ 3.5; na++ 141; ca 9.0; gfr>60; protein 6.3 albumin 2.7 chol 99; ldl 46; trig 118; hdl 29; tsh 1.501 01-09-23 (#2); hgb 7.7; hct 28.9  01-10-23: wbc 13.0; hgb 7.7; hct 28.6; mcv 74.5; plt 268; hgb A1c 5.3; iron 18; tibc 374 HIV nr 01-11-23: wbc 11.1; hgb 8.2; hct 29.7 mcv 72.8 plt 272 01-12-23: wbc 12.9; hgb 8.4; hct 31.5; mcv 73.4 plt 291 01-16-23: wbc 12.3; hgb 7.5; hct 28.0; mcv 74.7 plt 286  01-19-23: hgb 8.3; hct 31.8 02-07-23: tsh 1.886 free t3: 2.6; free t4: 0.89 02-20-23: wbc 11.2; hgb 9.4; hct 34.6; mcv 80.7 plt 221; folate 10.1; vitamin B12: 613; iron 14 tibc 367; ferritin 11.  03-22-23: wbc  6.8; hgb 13.0; hct 44.5; mcv 82.7 plt clump; glucose 221; bun 8; creat 0.78; k+ 4.0; na++ 134; ca 8.7; gfr >60; d-dimer <0.27 04-17-23 hgb A1c 7.7  05-18-23: PSA 3.15 07-21-23: wbc 9.6; hgb 14.6; hct 48.9; mcv 95.0 plt 248; glucose 201; bun 15; creat 0.91; k+ 3.9; na++ 137; ca 9.4 gfr >60  08-04-23: glucose 201; bun 15; creat 0.81; k+ 3.9; na++ 137; ca 9.4 gfr >60 08-14-23: chol 98; ldl 44 trig 120 hdl 30 hgb A1c 6.3 09-11-23: hgb 14.9; hct 50.0 iron 88; tibc 300  10-17-23: glucose 183; bun 12; creat 0.92; k+ 4.3; na++ 138; ca 9.8; gfr >60  TODAY  11-24-23: urine micro-albumin 17.0 12-28-23: wbc 11.9; hgb 13.0; hct 44.6; mcv 94.3 plt 214; glucose 139; bun 12; creat 0.65; k+ 3.7; na++ 136; ca 8.7; gfr >60; protein 6.7 albumin 3.0; tsh 2.575' free t3: 2.8 free t4: 0.78; hgb A1c 5.5     Review of Systems  Constitutional:  Negative for malaise/fatigue.  Respiratory:  Negative for cough and shortness of breath.   Cardiovascular:  Negative for chest pain, palpitations and leg swelling.  Gastrointestinal:  Negative for abdominal pain, constipation and heartburn.  Musculoskeletal:  Negative for back pain, joint pain and myalgias.  Skin: Negative.   Neurological:  Negative for dizziness.  Psychiatric/Behavioral:  The patient is not nervous/anxious.     Physical Exam Constitutional:      General: He is not in acute distress.    Appearance: He is well-developed. He is not diaphoretic.  Neck:     Thyroid : Thyroid  mass and thyromegaly present.  Cardiovascular:     Rate and Rhythm: Normal rate and regular rhythm.     Pulses: Normal pulses.     Heart sounds: Normal heart sounds.  Pulmonary:     Effort: Pulmonary effort is normal. No respiratory distress.     Breath sounds: Normal breath sounds.  Abdominal:     General: Bowel sounds are normal. There is no distension.     Palpations: Abdomen is soft.  Tenderness: There is no abdominal tenderness.  Musculoskeletal:     Cervical back: Neck supple.      Comments: Left hemiplegia   Lymphadenopathy:     Cervical: No cervical adenopathy.  Skin:    General: Skin is warm and dry.  Neurological:     Mental Status: He is alert. Mental status is at baseline.     Comments: SLUMS 17/30   Psychiatric:        Mood and Affect: Mood normal.    ASSESSMENT/ PLAN:  TODAY;  Chronic constipation: will continue senna s daily as needed  2. GERD without esophagitis: is off PPI  3. 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 Vitamin D  deficiency: 43.99; is off supplement   5. 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 Multinodular goiter: is status post aspiration; has seen endocrinology 05-04-23.   7. Type 2 diabetes mellitus with neurological complications: hgb A1c 5.5; will continue metofrmon xr 1 gm daily   farxiga 10 mg daily   8. Hypertension associated with type 2 diabetes mellitus: b/p 11074 is presently off medications  9. Delusions/paranoia is on seroquel  75 mg in the AM and 100 mg nightly does have periodic outburst with cussing and people talking about him  10. PAF (paroxysmal atrial fibrillation) heart rate is stable will continue eliquis  5 mg twice daily   11 .PBA (pseudobulbar affect) He has few verbal outburst with neudexta will continue this medication twice daily   12. Hypochromic anemia With hgb 14.9; his anemia has resolved; has a normal iron panel   13. Protein-calorie malnutrition, severe (HCC) His albumin is 2.5 he has declined supplements and prosource.   14. Dyslipidemia associated with type 2 diabetes mellitus: ldl 44 will continue lipitor 20 mg daily   15. Diabetic peripheral neuropathy: will continue gabapentin  100 mg nightly  16. Seizures: no recent activities: will continue keppra  xl 500 mg  daily      Barnie Seip NP [facility]   call [phone]

## 2024-02-20 ENCOUNTER — Non-Acute Institutional Stay (SKILLED_NURSING_FACILITY): Payer: Self-pay | Admitting: Internal Medicine

## 2024-02-20 ENCOUNTER — Encounter: Payer: Self-pay | Admitting: Internal Medicine

## 2024-02-20 DIAGNOSIS — E43 Unspecified severe protein-calorie malnutrition: Secondary | ICD-10-CM | POA: Diagnosis not present

## 2024-02-20 DIAGNOSIS — I480 Paroxysmal atrial fibrillation: Secondary | ICD-10-CM

## 2024-02-20 DIAGNOSIS — E1151 Type 2 diabetes mellitus with diabetic peripheral angiopathy without gangrene: Secondary | ICD-10-CM

## 2024-02-20 DIAGNOSIS — I69354 Hemiplegia and hemiparesis following cerebral infarction affecting left non-dominant side: Secondary | ICD-10-CM

## 2024-02-20 DIAGNOSIS — D508 Other iron deficiency anemias: Secondary | ICD-10-CM | POA: Diagnosis not present

## 2024-02-20 NOTE — Assessment & Plan Note (Signed)
 Current A1c is in the prediabetes range with a value of 5.5%.  This represents a dramatic improvement from a value of 7.7% on 04/17/2023.  No change indicated, continue to monitor.

## 2024-02-20 NOTE — Assessment & Plan Note (Signed)
 The left hemiparesis is unchanged.  His wheelchair propelling himself with his right lower extremity.

## 2024-02-20 NOTE — Patient Instructions (Signed)
 See assessment and plan under each diagnosis in the problem list and acutely for this visit

## 2024-02-20 NOTE — Assessment & Plan Note (Signed)
 Anemia has resolved; current H/H of 13/44.6.  Iron panel is normal.  No bleeding dyscrasias reported, continue to monitor.

## 2024-02-20 NOTE — Progress Notes (Unsigned)
 NURSING HOME LOCATION:  [facility] ROOM NUMBER:  113 [id number]  CODE STATUS:  Full Code  PCP: Landy Barnie RAMAN, NP   This is a nursing facility follow up visit for of chronic medical diagnoses to document compliance with Regulation 483.30 (c) in The Long Term Care Survey Manual Phase 2 which mandates caregiver visit ( visits can alternate among physician, PA or NP as per statutes) within 10 days of 30 days / 60 days/ 90 days post admission to SNF date  .  Interim medical record and care since last SNF visit was updated with review of diagnostic studies and change in clinical status since last visit were documented.  HPI: He is a permanent resident of this facility with medical diagnoses of diabetes with vascular complications, GERD without esophagitis, history of stroke with left-sided hemiparesis, history of PAF, history of vitamin D  deficiency, dyslipidemia, history of seizure, essential hypertension, multinodular goiter, and pseudobulbar affect. Most recent labs were performed 12/28/2023; although albumin was 3 total protein was normal at 6.7.  Anemia has resolved with current H/H of 13/44.6.  A1c is in the nondiabetic range at 5.5%.  This represents a significant improvement from a value of 7.7% on 04/17/2023.  TFTs are normal.  Review of systems: He denies any active symptoms at this time other than polyphasia.  There is been no progression of his neuromuscular deficits.  Constitutional: No fever, significant weight change, fatigue  Eyes: No redness, discharge, pain, vision change ENT/mouth: No nasal congestion,  purulent discharge, earache, change in hearing, sore throat  Cardiovascular: No chest pain, palpitations, paroxysmal nocturnal dyspnea, claudication, edema  Respiratory: No cough, sputum production, hemoptysis, DOE, significant snoring, apnea   Gastrointestinal: No heartburn, dysphagia, abdominal pain, nausea /vomiting, rectal bleeding, melena, change in  bowels Genitourinary: No dysuria, hematuria, pyuria, incontinence, nocturia Musculoskeletal: No joint stiffness, joint swelling, weakness, pain Dermatologic: No rash, pruritus, change in appearance of skin Neurologic: No dizziness, headache, syncope, seizures, numbness, tingling Psychiatric: No significant anxiety, depression, insomnia, anorexia Endocrine: No change in hair/skin/nails, excessive thirst, excessive hunger, excessive urination  Hematologic/lymphatic: No significant bruising, lymphadenopathy, abnormal bleeding Allergy/immunology: No itchy/watery eyes, significant sneezing, urticaria, angioedema  Physical exam:  Pertinent or positive findings: There is a well-healed extensive operative scar over the right lateral and posterior skull.  Keratotic dermatitis is present over the crown.  Speech is hyponasal.  He is completely edentulous.  There is a large asymmetric goiter, larger on the right.  There is slight tachycardia and slight increase in the second heart sound.  No pulses are decreased. The left upper and left lower extremity weakness is unchanged clinically.  General appearance: Adequately nourished; no acute distress, increased work of breathing is present.   Lymphatic: No lymphadenopathy about the head, neck, axilla. Eyes: No conjunctival inflammation or lid edema is present. There is no scleral icterus. Ears:  External ear exam shows no significant lesions or deformities.   Nose:  External nasal examination shows no deformity or inflammation. Nasal mucosa are pink and moist without lesions, exudates Oral exam:  Lips and gums are healthy appearing. There is no oropharyngeal erythema or exudate. Neck:  No thyromegaly, masses, tenderness noted.    Heart:  Normal rate and regular rhythm. S1 and S2 normal without gallop, murmur, click, rub .  Lungs: Chest clear to auscultation without wheezes, rhonchi, rales, rubs. Abdomen: Bowel sounds are normal. Abdomen is soft and nontender  with no organomegaly, hernias, masses. GU: Deferred  Extremities:  No cyanosis,  clubbing, edema  Neurologic exam : Cn 2-7 intact Strength equal  in upper & lower extremities Balance, Rhomberg, finger to nose testing could not be completed due to clinical state Deep tendon reflexes are equal Skin: Warm & dry w/o tenting. No significant lesions or rash.  See summary under each active problem in the Problem List with associated updated therapeutic plan

## 2024-02-20 NOTE — Assessment & Plan Note (Signed)
 Minimal tachycardia was present on exam but rhythm is regular.  Continue to monitor.  TFTs are normal.

## 2024-02-20 NOTE — Assessment & Plan Note (Signed)
 Although albumin is low at 3.0, total protein is normal at 6.7.  Nutritionist continues to monitor.

## 2024-03-13 ENCOUNTER — Non-Acute Institutional Stay (INDEPENDENT_AMBULATORY_CARE_PROVIDER_SITE_OTHER): Payer: Self-pay

## 2024-03-13 DIAGNOSIS — Z8673 Personal history of transient ischemic attack (TIA), and cerebral infarction without residual deficits: Secondary | ICD-10-CM | POA: Diagnosis not present

## 2024-03-13 DIAGNOSIS — K219 Gastro-esophageal reflux disease without esophagitis: Secondary | ICD-10-CM

## 2024-03-13 DIAGNOSIS — K5909 Other constipation: Secondary | ICD-10-CM | POA: Diagnosis not present

## 2024-03-13 NOTE — Progress Notes (Unsigned)
 Location:  [facility] Room Number: 113-D Place of Service:  SNF (31) Provider:  Landy Barnie RAMAN, NP  Patient Care Team: Landy Barnie RAMAN, NP as PCP - General (Geriatric Medicine) [facility]  Extended Emergency Contact Information Primary Emergency Contact: Brinkley,Jacqueline  [place] Home Phone: [phone] Mobile Phone: [phone] Relation: Sister Secondary Emergency Contact: Coleman,Dorris  [place] Mobile Phone: [phone] Relation: Aunt  Code Status:  DNR/DNH Goals of care: Advanced Directive information    02/01/2024    2:32 PM  Advanced Directives  Does Patient Have a Medical Advance Directive? Yes  Type of Advance Directive Out of facility DNR (pink MOST or yellow form);Healthcare Power of [place];Living will  Does patient want to make changes to medical advance directive? No - Patient declined  Copy of Healthcare Power of Attorney in Chart? Yes - validated most recent copy scanned in chart (See row information)  Pre-existing out of facility DNR order (yellow form or pink MOST form) Pink MOST/Yellow Form most recent copy in chart - Physician notified to receive inpatient order     Chief Complaint  Patient presents with   Medical Management of Chronic Issues    Discuss need for Pneumonia and flu vaccine as well as foot exam and ophthalmology exam.    HPI:  Pt is a 63 y.o. male seen today for medical management of chronic diseases.     Past Medical History:  Diagnosis Date   A-fib [facility])    DM (diabetes mellitus) (HCC)    History of seizures    Hypertension    Stroke [facility])    Past Surgical History:  Procedure Laterality Date   CHOLECYSTECTOMY     COLONOSCOPY WITH PROPOFOL  N/A 01/11/2023   Procedure: COLONOSCOPY WITH PROPOFOL ;  Surgeon: Shaaron Lamar HERO, MD;  [facility];  Service: Endoscopy;  Laterality: N/A;   CRANIECTOMY Right 04/01/2017    Procedure: RIGHT DECOMPRESSIVE CRANIECTOMY;  Surgeon: Ditty, Morene Hicks, MD;  Location: [facility] OR;  Service: Neurosurgery;  Laterality: Right;   ESOPHAGOGASTRODUODENOSCOPY N/A 04/13/2017   Procedure: ESOPHAGOGASTRODUODENOSCOPY (EGD);  Surgeon: Sebastian Moles, MD;  Location: [facility] ENDOSCOPY;  Service: General;  Laterality: N/A;  bedside   ESOPHAGOGASTRODUODENOSCOPY (EGD) WITH PROPOFOL  N/A 01/11/2023   Procedure: ESOPHAGOGASTRODUODENOSCOPY (EGD) WITH PROPOFOL ;  Surgeon: Shaaron Lamar HERO, MD;  [facility];  Service: Endoscopy;  Laterality: N/A;   GIVENS CAPSULE STUDY N/A 05/15/2023   Procedure: GIVENS CAPSULE STUDY;  Surgeon: Shaaron Lamar HERO, MD;  [facility];  Service: Endoscopy;  Laterality: N/A;  [facility]am[facility]   INCISION AND DRAINAGE PERIRECTAL ABSCESS N/A 09/28/2017   Procedure: IRRIGATION AND DEBRIDEMENT PERIANAL ABSCESS;  Surgeon: Kallie Manuelita BROCKS, MD;  Location: [facility] ORS;  Service: General;  Laterality: N/A;   PEG PLACEMENT N/A 04/13/2017   Procedure: PERCUTANEOUS ENDOSCOPIC GASTROSTOMY (PEG) PLACEMENT;  Surgeon: Sebastian Moles, MD;  Location: [facility] ENDOSCOPY;  Service: General;  Laterality: N/A;   POLYPECTOMY  01/11/2023   Procedure: POLYPECTOMY INTESTINAL;  Surgeon: Shaaron Lamar HERO, MD;  [facility];  Service: Endoscopy;;    Allergies  Allergen Reactions   Cheese    Penicillins Hives    Has patient had a PCN reaction causing immediate rash, facial/tongue/throat swelling, SOB or lightheadedness with hypotension: Unknown Has patient had a PCN reaction causing severe rash involving mucus membranes or skin necrosis: Unknown Has patient had a PCN reaction that required hospitalization: No Has patient had a  PCN reaction occurring within the last 10 years: No If all of the above answers are NO, then may proceed with Cephalosporin use.     Outpatient Encounter Medications as of 03/13/2024  Medication Sig   acetaminophen  (TYLENOL ) 325 MG tablet Take 650 mg  by mouth every 4 (four) hours as needed. Every 4-6 hours as needed, not to exceed 3 grams in 24 hours   apixaban  (ELIQUIS ) 5 MG TABS tablet Take 5 mg by mouth 2 (two) times daily.    atorvastatin  (LIPITOR) 20 MG tablet Take 20 mg by mouth at bedtime.   Cholecalciferol  1.25 MG (50000 UT) capsule Take 50,000 Units by mouth every Monday. For Low Vitamin D  Level on Monday   dapagliflozin propanediol (FARXIGA) 10 MG TABS tablet Take 10 mg by mouth daily.   Dextromethorphan -quiNIDine (NUEDEXTA) 20-10 MG capsule Take 1 capsule by mouth 2 (two) times daily.   ferrous sulfate 325 (65 FE) MG EC tablet Take 325 mg by mouth daily. Mon, Wed & Fri.   gabapentin  (NEURONTIN ) 100 MG capsule Take 100 mg by mouth at bedtime.   levETIRAcetam  (KEPPRA  XR) 500 MG 24 hr tablet Take 500 mg by mouth at bedtime.   loratadine (CLARITIN) 10 MG tablet Take 10 mg by mouth daily as needed.   LORazepam  (ATIVAN ) 2 MG/ML injection Inject 1 mg into the vein every 15 (fifteen) minutes as needed for seizure (every 15 mins as needed up to 3 doses for seizure lasting longer than 2 minutes).   Menthol, Topical Analgesic, (BIOFREEZE) 4 % GEL Apply 1 application  topically at bedtime as needed. Apply to left shoulder   metFORMIN (GLUCOPHAGE-XR) 500 MG 24 hr tablet Take 500 mg by mouth daily with breakfast.   polyethylene glycol (MIRALAX  / GLYCOLAX ) 17 g packet Take 17 g by mouth daily. 9 pm   QUEtiapine  (SEROQUEL ) 100 MG tablet Take 100 mg by mouth at bedtime.   QUEtiapine  (SEROQUEL ) 25 MG tablet Take 75 mg by mouth daily. At 10 am   sennosides-docusate sodium  (SENOKOT-S) 8.6-50 MG tablet Take 1 tablet by mouth daily as needed for constipation.   guaiFENesin -dextromethorphan  (ROBITUSSIN DM) 100-10 MG/5ML syrup Take 15 mLs by mouth every 6 (six) hours as needed for cough. (Patient not taking: Reported on 03/13/2024)   NON FORMULARY Diet:Regular (Patient not taking: Reported on 03/13/2024)   No facility-administered encounter medications on  file as of 03/13/2024.    Review of Systems  Immunization History  Administered Date(s) Administered    sv, Bivalent, Protein Subunit Rsvpref,pf (Abrysvo) 07/11/2022   Influenza,inj,Quad PF,6+ Mos 05/29/2023   Influenza-Unspecified 05/13/2019, 05/15/2020, 05/10/2022   Moderna Covid-19 Fall Seasonal Vaccine 27yrs & older 05/16/2023   Moderna Covid-19 Vaccine Bivalent Booster 33yrs & up 07/21/2021, 09/08/2021, 06/01/2022   Moderna SARS-COV2 Booster Vaccination 06/01/2021, 12/28/2021, 05/16/2023, 11/07/2023   Moderna Sars-Covid-2 Vaccination 08/14/2019, 09/11/2019, 06/11/2020, 11/18/2020   Pneumococcal Polysaccharide-23 03/29/2020   Pneumococcal-Unspecified 05/10/2018   Tdap 01/07/2019   Zoster Recombinant(Shingrix) 12/03/2021, 03/02/2022   Pertinent  Health Maintenance Due  Topic Date Due   OPHTHALMOLOGY EXAM  11/21/2023   FOOT EXAM  12/13/2023   INFLUENZA VACCINE  03/08/2024   HEMOGLOBIN A1C  06/29/2024   Colonoscopy  Discontinued      08/24/2022   12:35 PM 09/30/2022   10:20 AM 12/14/2022   11:34 AM 02/15/2023   12:39 PM 08/11/2023    2:01 PM  Fall Risk  Falls in the past year? 0 0 0 0 0  Was there an injury with Fall?  0 0 0 0 0  Fall Risk Category Calculator  0 0 0 0  Patient at Risk for Falls Due to History of fall(s);Impaired balance/gait;Impaired mobility No Fall Risks No Fall Risks Impaired balance/gait;Impaired mobility Impaired balance/gait;Impaired mobility  Fall risk Follow up Falls evaluation completed  Falls evaluation completed Falls evaluation completed       Data saved with a previous flowsheet row definition   Functional Status Survey:    Vitals:   03/13/24 1004 03/13/24 1005  BP: (!) 102/55 106/80  Pulse: 86   Temp: 97.6 F (36.4 C)   SpO2: 97%   Weight: 180 lb 6.4 oz (81.8 kg)   Height: 5' 7 (1.702 m)    Body mass index is 28.25 kg/m. Physical Exam  Labs reviewed: Recent Labs    08/04/23 0640 10/17/23 1200 12/28/23 0630  NA 137 138 136  K  3.9 4.3 3.7  CL 103 107 106  CO2 27 24 22   GLUCOSE 201* 183* 139*  BUN 15 12 12   CREATININE 0.81 0.92 0.65  CALCIUM  9.4 9.8 8.7*   Recent Labs    12/28/23 0630  AST 10*  ALT 12  ALKPHOS 62  BILITOT 0.4  PROT 6.7  ALBUMIN 3.0*   Recent Labs    03/22/23 1053 04/12/23 0900 07/21/23 0800 09/11/23 0600 12/28/23 0630  WBC 6.8 9.8 9.6  --  11.9*  NEUTROABS 2.7 5.4 4.7  --   --   HGB 13.0 13.0 14.6 14.9 13.0  HCT 44.5 45.2 48.9 50.0 44.6  MCV 82.7 86.6 95.0  --  94.3  PLT ACLMP 184 248  --  214   Lab Results  Component Value Date   TSH 2.575 12/28/2023   Lab Results  Component Value Date   HGBA1C 5.5 12/28/2023   Lab Results  Component Value Date   CHOL 98 08/14/2023   HDL 30 (L) 08/14/2023   LDLCALC 44 08/14/2023   TRIG 120 08/14/2023   CHOLHDL 3.3 08/14/2023    Significant Diagnostic Results in last 30 days:  No results found.  Assessment/Plan There are no diagnoses linked to this encounter.   Family/ staff Communication: ***  Labs/tests ordered:  ***

## 2024-03-15 ENCOUNTER — Encounter: Payer: Self-pay | Admitting: Adult Health

## 2024-03-15 ENCOUNTER — Non-Acute Institutional Stay (SKILLED_NURSING_FACILITY): Payer: Self-pay | Admitting: Adult Health

## 2024-03-15 DIAGNOSIS — I700 Atherosclerosis of aorta: Secondary | ICD-10-CM

## 2024-03-15 DIAGNOSIS — E042 Nontoxic multinodular goiter: Secondary | ICD-10-CM

## 2024-03-15 DIAGNOSIS — I480 Paroxysmal atrial fibrillation: Secondary | ICD-10-CM | POA: Diagnosis not present

## 2024-03-15 NOTE — Progress Notes (Signed)
 Location:  [facility] Room Number: 113 D Place of Service:  SNF (31)   CODE STATUS: DNR/DNH  Allergies  Allergen Reactions   Cheese    Penicillins Hives    Has patient had a PCN reaction causing immediate rash, facial/tongue/throat swelling, SOB or lightheadedness with hypotension: Unknown Has patient had a PCN reaction causing severe rash involving mucus membranes or skin necrosis: Unknown Has patient had a PCN reaction that required hospitalization: No Has patient had a PCN reaction occurring within the last 10 years: No If all of the above answers are NO, then may proceed with Cephalosporin use.     Chief Complaint  Patient presents with   Acute Visit    Care plan meeting    HPI:  We have come together for his care plan meeting. BIMS 15/15 mood 0/30. He is using a wheelchair without falls. He requires dependent assist with his adl care. He is incontinent of bladder and frequently incontinent of bowel. Dietary: regular diet setup for meals; appetite 51-100%; weight is 180.4 pounds. Therapy: none at this time. Activities: does attend. He will continue to be followed for his chronic illnesses including:  Aortic atherosclerosis  AF(paroxsymal atrial fibrillation)    Multi nodular goiter  Past Medical History:  Diagnosis Date   A-fib (HCC)    DM (diabetes mellitus) (HCC)    History of seizures    Hypertension    Stroke [facility])     Past Surgical History:  Procedure Laterality Date   CHOLECYSTECTOMY     COLONOSCOPY WITH PROPOFOL  N/A 01/11/2023   Procedure: COLONOSCOPY WITH PROPOFOL ;  Surgeon: Shaaron Lamar HERO, MD;  [facility];  Service: Endoscopy;  Laterality: N/A;   CRANIECTOMY Right 04/01/2017   Procedure: RIGHT DECOMPRESSIVE CRANIECTOMY;  Surgeon: Ditty, Morene Hicks, MD;  Location: [facility] OR;  Service: Neurosurgery;  Laterality: Right;   ESOPHAGOGASTRODUODENOSCOPY N/A 04/13/2017   Procedure: ESOPHAGOGASTRODUODENOSCOPY (EGD);  Surgeon:  Sebastian Moles, MD;  Location: [facility] ENDOSCOPY;  Service: General;  Laterality: N/A;  bedside   ESOPHAGOGASTRODUODENOSCOPY (EGD) WITH PROPOFOL  N/A 01/11/2023   Procedure: ESOPHAGOGASTRODUODENOSCOPY (EGD) WITH PROPOFOL ;  Surgeon: Shaaron Lamar HERO, MD;  [facility];  Service: Endoscopy;  Laterality: N/A;   GIVENS CAPSULE STUDY N/A 05/15/2023   Procedure: GIVENS CAPSULE STUDY;  Surgeon: Shaaron Lamar HERO, MD;  [facility];  Service: Endoscopy;  Laterality: N/A;  [facility]   INCISION AND DRAINAGE PERIRECTAL ABSCESS N/A 09/28/2017   Procedure: IRRIGATION AND DEBRIDEMENT PERIANAL ABSCESS;  Surgeon: Kallie Manuelita BROCKS, MD;  Location: [facility] ORS;  Service: General;  Laterality: N/A;   PEG PLACEMENT N/A 04/13/2017   Procedure: PERCUTANEOUS ENDOSCOPIC GASTROSTOMY (PEG) PLACEMENT;  Surgeon: Sebastian Moles, MD;  Location: [facility] ENDOSCOPY;  Service: General;  Laterality: N/A;   POLYPECTOMY  01/11/2023   Procedure: POLYPECTOMY INTESTINAL;  Surgeon: Shaaron Lamar HERO, MD;  [facility];  Service: Endoscopy;;    Social History   Socioeconomic History   Marital status: Widowed    Spouse name: Not on file   Number of children: Not on file   Years of education: Not on file   Highest education level: Not on file  Occupational History   Not on file  Tobacco Use   Smoking status: Former    Types: Cigarettes   Smokeless tobacco: Never   Tobacco comments:    UTA  Vaping Use   Vaping status: Never Used  Substance and Sexual Activity   Alcohol use:  No    Comment: UTA   Drug use: No    Comment: UTA   Sexual activity: Not Currently    Birth control/protection: None  Other Topics Concern   Not on file  Social History Narrative   Not on file   Social Drivers of Health   Financial Resource Strain: Not on file  Food Insecurity: No Food Insecurity (01/09/2023)   Hunger Vital Sign    Worried About Running Out of Food in the Last Year: Never true    Ran Out of Food in the  Last Year: Never true  Transportation Needs: No Transportation Needs (01/09/2023)   PRAPARE - Administrator, Civil Service (Medical): No    Lack of Transportation (Non-Medical): No  Physical Activity: Not on file  Stress: Not on file  Social Connections: Not on file  Intimate Partner Violence: Not At Risk (01/09/2023)   Humiliation, Afraid, Rape, and Kick questionnaire    Fear of Current or Ex-Partner: No    Emotionally Abused: No    Physically Abused: No    Sexually Abused: No   Family History  Problem Relation Age of Onset   Hypertension Father    Colon cancer Neg Hx    Colon polyps Neg Hx       VITAL SIGNS BP 106/80   Pulse 86   Temp 97.6 F (36.4 C)   Resp 20   Ht 5' 7 (1.702 m)   Wt 180 lb 6.4 oz (81.8 kg)   SpO2 97%   BMI 28.25 kg/m   Outpatient Encounter Medications as of 03/15/2024  Medication Sig   acetaminophen  (TYLENOL ) 325 MG tablet Take 650 mg by mouth every 4 (four) hours as needed. Every 4-6 hours as needed, not to exceed 3 grams in 24 hours   apixaban  (ELIQUIS ) 5 MG TABS tablet Take 5 mg by mouth 2 (two) times daily.    atorvastatin  (LIPITOR) 20 MG tablet Take 20 mg by mouth at bedtime.   Cholecalciferol  1.25 MG (50000 UT) capsule Take 50,000 Units by mouth every Monday. For Low Vitamin D  Level on Monday   dapagliflozin propanediol (FARXIGA) 10 MG TABS tablet Take 10 mg by mouth daily.   Dextromethorphan -quiNIDine (NUEDEXTA) 20-10 MG capsule Take 1 capsule by mouth 2 (two) times daily.   ferrous sulfate 325 (65 FE) MG EC tablet Take 325 mg by mouth daily. Mon, Wed & Fri.   gabapentin  (NEURONTIN ) 100 MG capsule Take 100 mg by mouth at bedtime.   levETIRAcetam  (KEPPRA  XR) 500 MG 24 hr tablet Take 500 mg by mouth at bedtime.   loratadine (CLARITIN) 10 MG tablet Take 10 mg by mouth daily as needed.   LORazepam  (ATIVAN ) 2 MG/ML injection Inject 1 mg into the vein every 15 (fifteen) minutes as needed for seizure (every 15 mins as needed up to 3 doses  for seizure lasting longer than 2 minutes).   Menthol, Topical Analgesic, (BIOFREEZE) 4 % GEL Apply 1 application  topically at bedtime as needed. Apply to left shoulder   metFORMIN (GLUCOPHAGE-XR) 500 MG 24 hr tablet Take 500 mg by mouth daily with breakfast. (Patient taking differently: Take 1,000 mg by mouth daily with breakfast.)   polyethylene glycol (MIRALAX  / GLYCOLAX ) 17 g packet Take 17 g by mouth daily. 9 pm   QUEtiapine  (SEROQUEL ) 100 MG tablet Take 100 mg by mouth at bedtime.   QUEtiapine  (SEROQUEL ) 25 MG tablet Take 75 mg by mouth daily. At 10 am   sennosides-docusate sodium  (  SENOKOT-S) 8.6-50 MG tablet Take 1 tablet by mouth daily as needed for constipation.   guaiFENesin -dextromethorphan  (ROBITUSSIN DM) 100-10 MG/5ML syrup Take 15 mLs by mouth every 6 (six) hours as needed for cough. (Patient not taking: Reported on 03/15/2024)   NON FORMULARY Diet:Regular (Patient not taking: Reported on 03/15/2024)   No facility-administered encounter medications on file as of 03/15/2024.     SIGNIFICANT DIAGNOSTIC EXAMS  PREVIOUS   06-10-21: thyroid  biopsy   01-11-23: EGD: normal esophagus; stomach, doudenal bulb and 2dn portion of duodenum; peg scar apparent   01-11-23: colonoscopy:  Two 3-5 mm polyp at hepatic flexure and in the cecum; removed with cold snare Diverticulosis in sigmoid colon and in the descending colon, redundant elongated colon Otherwise normal Doubt signs of GI bleed; may restart eliquis .   NO NEW EXAMS    LABS REVIEWED PREVIOUS:     03-22-23: wbc 6.8; hgb 13.0; hct 44.5; mcv 82.7 plt clump; glucose 221; bun 8; creat 0.78; k+ 4.0; na++ 134; ca 8.7; gfr >60; d-dimer <0.27 04-17-23 hgb A1c 7.7  05-18-23: PSA 3.15 07-21-23: wbc 9.6; hgb 14.6; hct 48.9; mcv 95.0 plt 248; glucose 201; bun 15; creat 0.91; k+ 3.9; na++ 137; ca 9.4 gfr >60  08-04-23: glucose 201; bun 15; creat 0.81; k+ 3.9; na++ 137; ca 9.4 gfr >60 08-14-23: chol 98; ldl 44 trig 120 hdl 30 hgb A1c 6.3 09-11-23: hgb  14.9; hct 50.0 iron 88; tibc 300  10-17-23: glucose 183; bun 12; creat 0.92; k+ 4.3; na++ 138; ca 9.8; gfr >60 11-24-23: urine micro-albumin 17.0 12-28-23: wbc 11.9; hgb 13.0; hct 44.6; mcv 94.3 plt 214; glucose 139; bun 12; creat 0.65; k+ 3.7; na++ 136; ca 8.7; gfr >60; protein 6.7 albumin 3.0; tsh 2.575' free t3: 2.8 free t4: 0.78; hgb A1c 5.5     NO NEW LABS.    Review of Systems  Constitutional:  Negative for malaise/fatigue.  Respiratory:  Negative for cough and shortness of breath.   Cardiovascular:  Negative for chest pain, palpitations and leg swelling.  Gastrointestinal:  Negative for abdominal pain, constipation and heartburn.  Musculoskeletal:  Negative for back pain, joint pain and myalgias.  Skin: Negative.   Neurological:  Negative for dizziness.  Psychiatric/Behavioral:  The patient is not nervous/anxious.     Physical Exam Constitutional:      General: He is not in acute distress.    Appearance: He is well-developed. He is not diaphoretic.  Neck:     Thyroid : Thyroid  mass and thyromegaly present.  Cardiovascular:     Rate and Rhythm: Normal rate and regular rhythm.     Pulses: Normal pulses.     Heart sounds: Normal heart sounds.  Pulmonary:     Effort: Pulmonary effort is normal. No respiratory distress.     Breath sounds: Normal breath sounds.  Abdominal:     General: Bowel sounds are normal. There is no distension.     Palpations: Abdomen is soft.     Tenderness: There is no abdominal tenderness.  Musculoskeletal:     Cervical back: Neck supple.     Right lower leg: No edema.     Left lower leg: No edema.     Comments: Left hemiplegia  Lymphadenopathy:     Cervical: No cervical adenopathy.  Skin:    General: Skin is warm and dry.  Neurological:     Mental Status: He is alert. Mental status is at baseline.     Comments: SLUMS 17/30  Psychiatric:  Mood and Affect: Mood normal.      ASSESSMENT/ PLAN:  TODAY  Aortic  atherosclerosis AF(paroxsymal atrial fibrillation) Multi nodular goiter  Will continue current medications Will continue current plan of care Will continue to monitor his status.   Time spent with patient: 40 minutes: medications; dietary plan of goal.    Barnie Seip NP [facility]  call [phone]

## 2024-03-18 ENCOUNTER — Non-Acute Institutional Stay (SKILLED_NURSING_FACILITY): Payer: Self-pay | Admitting: Adult Health

## 2024-03-18 ENCOUNTER — Encounter: Payer: Self-pay | Admitting: Adult Health

## 2024-03-18 DIAGNOSIS — G44209 Tension-type headache, unspecified, not intractable: Secondary | ICD-10-CM | POA: Diagnosis not present

## 2024-03-18 NOTE — Progress Notes (Signed)
 Location:  [facility] Room Number: 113 Place of Service:  SNF (31)   CODE STATUS: dnr   Allergies  Allergen Reactions   Cheese    Penicillins Hives    Has patient had a PCN reaction causing immediate rash, facial/tongue/throat swelling, SOB or lightheadedness with hypotension: Unknown Has patient had a PCN reaction causing severe rash involving mucus membranes or skin necrosis: Unknown Has patient had a PCN reaction that required hospitalization: No Has patient had a PCN reaction occurring within the last 10 years: No If all of the above answers are NO, then may proceed with Cephalosporin use.     Chief Complaint  Patient presents with   Acute Visit    Headache     HPI:  He has been experiencing increased headaches. He states that the pain is near his surgical area on his head. He told me that he did take tylenol  one time without relief of headache. He did have some nausea and vomiting. He continues to get out of bed daily and propels self around facility.   Past Medical History:  Diagnosis Date   A-fib [facility])    DM (diabetes mellitus) (HCC)    History of seizures    Hypertension    Stroke [facility])     Past Surgical History:  Procedure Laterality Date   CHOLECYSTECTOMY     COLONOSCOPY WITH PROPOFOL  N/A 01/11/2023   Procedure: COLONOSCOPY WITH PROPOFOL ;  Surgeon: Shaaron Lamar HERO, MD;  Location: [facility] ENDO [facility];  Service: Endoscopy;  Laterality: N/A;   CRANIECTOMY Right 04/01/2017   Procedure: RIGHT DECOMPRESSIVE CRANIECTOMY;  Surgeon: Ditty, Morene Hicks, MD;  Location: [facility] OR;  Service: Neurosurgery;  Laterality: Right;   ESOPHAGOGASTRODUODENOSCOPY N/A 04/13/2017   Procedure: ESOPHAGOGASTRODUODENOSCOPY (EGD);  Surgeon: Sebastian Moles, MD;  Location: [facility] ENDOSCOPY;  Service: General;  Laterality: N/A;  bedside   ESOPHAGOGASTRODUODENOSCOPY (EGD) WITH PROPOFOL  N/A 01/11/2023   Procedure: ESOPHAGOGASTRODUODENOSCOPY (EGD) WITH PROPOFOL ;  Surgeon: Shaaron Lamar HERO, MD;  Location: [facility];  Service: Endoscopy;  Laterality: N/A;   GIVENS CAPSULE STUDY N/A 05/15/2023   Procedure: GIVENS CAPSULE STUDY;  Surgeon: Shaaron Lamar HERO, MD;  Location: [facility];  Service: Endoscopy;  Laterality: N/A;  730am, at [facility]   INCISION AND DRAINAGE PERIRECTAL ABSCESS N/A 09/28/2017   Procedure: IRRIGATION AND DEBRIDEMENT PERIANAL ABSCESS;  Surgeon: Kallie Manuelita BROCKS, MD;  Location: [facility] ORS;  Service: General;  Laterality: N/A;   PEG PLACEMENT N/A 04/13/2017   Procedure: PERCUTANEOUS ENDOSCOPIC GASTROSTOMY (PEG) PLACEMENT;  Surgeon: Sebastian Moles, MD;  Location: [facility] ENDOSCOPY;  Service: General;  Laterality: N/A;   POLYPECTOMY  01/11/2023   Procedure: POLYPECTOMY INTESTINAL;  Surgeon: Shaaron Lamar HERO, MD;  Location: [facility];  Service: Endoscopy;;    Social History   Socioeconomic History   Marital status: Widowed    Spouse name: Not on file   Number of children: Not on file   Years of education: Not on file   Highest education level: Not on file  Occupational History   Not on file  Tobacco Use   Smoking status: Former    Types: Cigarettes   Smokeless tobacco: Never   Tobacco comments:    UTA  Vaping Use   Vaping status: Never Used  Substance and Sexual Activity   Alcohol use: No    Comment: UTA   Drug use: No    Comment: UTA   Sexual activity: Not Currently    Birth control/protection: None  Other Topics Concern   Not on file  Social History Narrative   Not on file   Social Drivers of Health   Financial Resource Strain: Not on file  Food Insecurity: No Food Insecurity (01/09/2023)   Hunger Vital Sign    Worried About Running Out of Food in the Last Year: Never true    Ran Out of Food in the Last Year: Never true  Transportation Needs: No Transportation Needs (01/09/2023)   PRAPARE - Administrator, Civil Service (Medical): No    Lack of Transportation (Non-Medical): No  Physical Activity: Not on file  Stress:  Not on file  Social Connections: Not on file  Intimate Partner Violence: Not At Risk (01/09/2023)   Humiliation, Afraid, Rape, and Kick questionnaire    Fear of Current or Ex-Partner: No    Emotionally Abused: No    Physically Abused: No    Sexually Abused: No   Family History  Problem Relation Age of Onset   Hypertension Father    Colon cancer Neg Hx    Colon polyps Neg Hx       VITAL SIGNS BP (!) 145/81   Pulse 94   Temp 97.7 F (36.5 C)   Resp 16   Ht 5' 7 (1.702 m)   Wt 182 lb 3.2 oz (82.6 kg)   SpO2 93%   BMI 28.54 kg/m   Outpatient Encounter Medications as of 03/18/2024  Medication Sig   acetaminophen  (TYLENOL ) 325 MG tablet Take 650 mg by mouth every 4 (four) hours as needed. Every 4-6 hours as needed, not to exceed 3 grams in 24 hours   apixaban  (ELIQUIS ) 5 MG TABS tablet Take 5 mg by mouth 2 (two) times daily.    atorvastatin  (LIPITOR) 20 MG tablet Take 20 mg by mouth at bedtime.   Cholecalciferol  1.25 MG (50000 UT) capsule Take 50,000 Units by mouth every Monday. For Low Vitamin D  Level on Monday   dapagliflozin propanediol (FARXIGA) 10 MG TABS tablet Take 10 mg by mouth daily.   Dextromethorphan -quiNIDine (NUEDEXTA) 20-10 MG capsule Take 1 capsule by mouth 2 (two) times daily.   ferrous sulfate 325 (65 FE) MG EC tablet Take 325 mg by mouth daily. Mon, Wed & Fri.   gabapentin  (NEURONTIN ) 100 MG capsule Take 100 mg by mouth at bedtime.   guaiFENesin -dextromethorphan  (ROBITUSSIN DM) 100-10 MG/5ML syrup Take 15 mLs by mouth every 6 (six) hours as needed for cough. (Patient not taking: Reported on 03/15/2024)   levETIRAcetam  (KEPPRA  XR) 500 MG 24 hr tablet Take 500 mg by mouth at bedtime.   loratadine (CLARITIN) 10 MG tablet Take 10 mg by mouth daily as needed.   LORazepam  (ATIVAN ) 2 MG/ML injection Inject 1 mg into the vein every 15 (fifteen) minutes as needed for seizure (every 15 mins as needed up to 3 doses for seizure lasting longer than 2 minutes).   Menthol,  Topical Analgesic, (BIOFREEZE) 4 % GEL Apply 1 application  topically at bedtime as needed. Apply to left shoulder   metFORMIN (GLUCOPHAGE-XR) 500 MG 24 hr tablet Take 500 mg by mouth daily with breakfast. (Patient taking differently: Take 1,000 mg by mouth daily with breakfast.)   NON FORMULARY Diet:Regular (Patient not taking: Reported on 03/15/2024)   polyethylene glycol (MIRALAX  / GLYCOLAX ) 17 g packet Take 17 g by mouth daily. 9 pm   QUEtiapine  (SEROQUEL ) 100 MG tablet Take 100 mg by mouth at bedtime.   QUEtiapine  (SEROQUEL ) 25 MG tablet Take 75 mg  by mouth daily. At 10 am   sennosides-docusate sodium  (SENOKOT-S) 8.6-50 MG tablet Take 1 tablet by mouth daily as needed for constipation.   No facility-administered encounter medications on file as of 03/18/2024.     SIGNIFICANT DIAGNOSTIC EXAMS  PREVIOUS   06-10-21: thyroid  biopsy   01-11-23: EGD: normal esophagus; stomach, doudenal bulb and 2dn portion of duodenum; peg scar apparent   01-11-23: colonoscopy:  Two 3-5 mm polyp at hepatic flexure and in the cecum; removed with cold snare Diverticulosis in sigmoid colon and in the descending colon, redundant elongated colon Otherwise normal Doubt signs of GI bleed; may restart eliquis .   NO NEW EXAMS    LABS REVIEWED PREVIOUS:     03-22-23: wbc 6.8; hgb 13.0; hct 44.5; mcv 82.7 plt clump; glucose 221; bun 8; creat 0.78; k+ 4.0; na++ 134; ca 8.7; gfr >60; d-dimer <0.27 04-17-23 hgb A1c 7.7  05-18-23: PSA 3.15 07-21-23: wbc 9.6; hgb 14.6; hct 48.9; mcv 95.0 plt 248; glucose 201; bun 15; creat 0.91; k+ 3.9; na++ 137; ca 9.4 gfr >60  08-04-23: glucose 201; bun 15; creat 0.81; k+ 3.9; na++ 137; ca 9.4 gfr >60 08-14-23: chol 98; ldl 44 trig 120 hdl 30 hgb A1c 6.3 09-11-23: hgb 14.9; hct 50.0 iron 88; tibc 300  10-17-23: glucose 183; bun 12; creat 0.92; k+ 4.3; na++ 138; ca 9.8; gfr >60 11-24-23: urine micro-albumin 17.0 12-28-23: wbc 11.9; hgb 13.0; hct 44.6; mcv 94.3 plt 214; glucose 139; bun 12;  creat 0.65; k+ 3.7; na++ 136; ca 8.7; gfr >60; protein 6.7 albumin 3.0; tsh 2.575' free t3: 2.8 free t4: 0.78; hgb A1c 5.5     NO NEW LABS.   Review of Systems  Constitutional:  Negative for malaise/fatigue.  Respiratory:  Negative for cough and shortness of breath.   Cardiovascular:  Negative for chest pain, palpitations and leg swelling.  Gastrointestinal:  Negative for abdominal pain, constipation and heartburn.  Musculoskeletal:  Negative for back pain, joint pain and myalgias.  Skin: Negative.   Neurological:  Positive for headaches. Negative for dizziness.  Psychiatric/Behavioral:  The patient is not nervous/anxious.    Physical Exam Constitutional:      General: He is not in acute distress.    Appearance: He is well-developed. He is not diaphoretic.  Neck:     Thyroid : Thyroid  mass and thyromegaly present.  Cardiovascular:     Rate and Rhythm: Normal rate and regular rhythm.     Heart sounds: Normal heart sounds.  Pulmonary:     Effort: Pulmonary effort is normal. No respiratory distress.     Breath sounds: Normal breath sounds.  Abdominal:     General: Bowel sounds are normal. There is no distension.     Palpations: Abdomen is soft.     Tenderness: There is no abdominal tenderness.  Musculoskeletal:     Cervical back: Neck supple.     Right lower leg: No edema.     Left lower leg: No edema.     Comments: Left hemiplegia  Lymphadenopathy:     Cervical: No cervical adenopathy.  Skin:    General: Skin is warm and dry.  Neurological:     Mental Status: He is alert. Mental status is at baseline.     Comments: SLUMS 17/30  Psychiatric:        Mood and Affect: Mood normal.      ASSESSMENT/ PLAN:  TODAY  Tension type headaches: will begin tylenol  cr 650 mg every 6 hours  Barnie Seip NP [facility]  call [phone]

## 2024-03-21 ENCOUNTER — Other Ambulatory Visit (HOSPITAL_COMMUNITY): Payer: Self-pay | Admitting: Adult Health

## 2024-03-21 DIAGNOSIS — R519 Headache, unspecified: Secondary | ICD-10-CM

## 2024-03-21 DIAGNOSIS — Z8673 Personal history of transient ischemic attack (TIA), and cerebral infarction without residual deficits: Secondary | ICD-10-CM

## 2024-03-22 ENCOUNTER — Ambulatory Visit (HOSPITAL_COMMUNITY)
Admission: RE | Admit: 2024-03-22 | Discharge: 2024-03-22 | Disposition: A | Discharge status: Home / Self Care | Source: Ambulatory Visit | Attending: Adult Health | Admitting: Adult Health

## 2024-03-22 DIAGNOSIS — R519 Headache, unspecified: Secondary | ICD-10-CM | POA: Insufficient documentation

## 2024-03-22 DIAGNOSIS — Z8673 Personal history of transient ischemic attack (TIA), and cerebral infarction without residual deficits: Secondary | ICD-10-CM | POA: Insufficient documentation

## 2024-04-01 ENCOUNTER — Other Ambulatory Visit: Payer: Self-pay | Admitting: Adult Health

## 2024-04-01 MED ORDER — MIDAZOLAM HCL (PF) 2 MG/2ML IJ SOLN: 2.0000 mg | INTRAMUSCULAR | 0 refills | Status: AC | PRN

## 2024-04-11 ENCOUNTER — Encounter: Payer: Self-pay | Admitting: Internal Medicine

## 2024-04-11 ENCOUNTER — Non-Acute Institutional Stay (SKILLED_NURSING_FACILITY): Payer: Self-pay | Admitting: Internal Medicine

## 2024-04-11 DIAGNOSIS — E1151 Type 2 diabetes mellitus with diabetic peripheral angiopathy without gangrene: Secondary | ICD-10-CM | POA: Diagnosis not present

## 2024-04-11 DIAGNOSIS — R569 Unspecified convulsions: Secondary | ICD-10-CM

## 2024-04-11 DIAGNOSIS — F01A2 Vascular dementia, mild, with psychotic disturbance: Secondary | ICD-10-CM

## 2024-04-11 DIAGNOSIS — I69354 Hemiplegia and hemiparesis following cerebral infarction affecting left non-dominant side: Secondary | ICD-10-CM | POA: Diagnosis not present

## 2024-04-11 NOTE — Progress Notes (Signed)
 NURSING HOME LOCATION:  [facility] ROOM NUMBER:  113 D  CODE STATUS:  Full Code  PCP: Landy Barnie RAMAN, NP   This is a nursing facility follow up visit for specific acute issue of recurrent seizures.  Interim medical record and care since last SNF visit was updated with review of diagnostic studies and change in clinical status since last visit were documented.  YEP:Dzpslmz activity was self reported initially 04/01/2024 at 1:57 PM.  He stated that he had  seizure approximately 1 AM that morning.  Vital signs were monitored and found to be stable. Because of the nationwide shortage of Ativan ; midazolam  was ordered IM every 15 minutes as needed for seizure activity in the context of nationwide shortage of Ativan . The patient states that he had 5 seizures on Wednesday 8/25 and 1 seizure on 8/31.  Also described 2 seizures on Tuesday 9/2.  He denied any seizure activity 8/28, 8/29, and 8/30. SNF resident progress notes were reviewed.  The Nurse Tech reported the resident was having multiple seizures at approximately 2:55 PM 9/2.  Initially seizures were brief but subsequently were lasting over a minute. IM midazolam  10 mL was administered IM at 1436; subsequent to this he was profoundly lethargic and somnolent.  Oral medicines were held because of the mental status changes.  He essentially would arouse to name but was not alert enough to take the pills orally.  Keppra  was increased on 500 mg to 750 mg. The patient had been having headaches as of early August with intermittent vomiting.  These were described as sharp and migrainous in the right frontal and right crown areas.  He stated that these were severe enough that he could not sit up.  Tylenol  would help the headaches.   CT was performed 8/14 to assess the recurrent headaches.  CT scan revealed right encephalomalacia sparing only the right ACA and PCA territories.  Right lateral ventricular and generalized ventricular  enlargement were present.  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  These findings are in the context of history of stroke in 2018 for which craniotomy was necessary. His previous  Neurologist,Dr. Milton , has retired.  The most recent neurologic evaluation with Dr. Dr. Rosemarie was completed as inpatient. His most recent MMSE's included a BCAT on 11/30/2023 with a score 30 out of 50 indicating mild neurocognitive deficits and a BIMS of 15 on 03/05/2024. The most recent labs were performed 12/28/2023 and revealed an A1c of 5.5%, prediabetic.  He is on Farxiga 10 mg daily & metformin XR 500 mg daily.  TSH was therapeutic at 2.57 with normal free T3 and free T4.  CBC revealed mild leukocytosis with a white count of 11,900. CMET revealed mild hypocalcemia with a value of 8.7; albumin of 3.0; total protein 6.7.  Review of systems: He denies any other change in his neuromuscular status other than the headaches.  Constitutional: No fever, significant weight change, fatigue  Eyes: No redness, discharge, pain, vision change ENT/mouth: No nasal congestion,  purulent discharge, earache, change in hearing, sore throat  Cardiovascular: No chest pain, palpitations, paroxysmal nocturnal dyspnea, edema  Respiratory: No cough, sputum production, hemoptysis, DOE, significant snoring, apnea   Gastrointestinal: No heartburn, dysphagia, abdominal pain, nausea /vomiting, rectal bleeding, melena, change in bowels Genitourinary: No dysuria, hematuria, pyuria, incontinence, nocturia Musculoskeletal: No joint stiffness, joint swelling, weakness, pain Dermatologic: No rash, pruritus, change in appearance of skin Neurologic: No dizziness, syncope, new numbness or tingling Psychiatric: No significant  anxiety, depression, insomnia, anorexia Endocrine: No change in hair/skin/nails, excessive thirst, excessive hunger, excessive urination  Hematologic/lymphatic: No significant bruising, lymphadenopathy, abnormal  bleeding Allergy/immunology: No itchy/watery eyes, significant sneezing, urticaria, angioedema  Physical exam:  Pertinent or positive findings: He was alert and oriented.  Extraocular motion was intact except for mild OS exotropia when he was asked to look at the tip of his nose.  He is edentulous.  He exhibits somewhat of a big baby talk cadence to his speech pattern.  Large goiter is present at the right neck.  Slight gallop cadence is noted.  He has low-grade homogenous rales.  Abdomen is protuberant.  Pedal pulses are surprisingly strong.  The left lower extremity is visibly larger than the right.  Toula' sign is negative.  There is trace edema at the sock line.  Left hemiparesis is present.  The right upper extremity appears to be slightly weak to opposition than the right lower extremity.  General appearance: Adequately nourished; no acute distress, increased work of breathing is present.   Lymphatic: No lymphadenopathy about the head, neck, axilla. Eyes: No conjunctival inflammation or lid edema is present. There is no scleral icterus. Ears:  External ear exam shows no significant lesions or deformities.   Nose:  External nasal examination shows no deformity or inflammation. Nasal mucosa are pink and moist without lesions, exudates Neck:  No  tenderness noted.    Heart:  No murmur, click, rub .  Lungs:  without wheezes, rhonchi, rubs. Abdomen: Bowel sounds are normal. Abdomen is soft and nontender with no organomegaly, hernias, masses. GU: Deferred  Extremities:  No cyanosis, clubbing  Neurologic exam :Balance, Rhomberg, finger to nose testing could not be completed due to clinical state Skin: Warm & dry w/o tenting. No significant lesions or rash.  See summary under each active problem in the Problem List with associated updated therapeutic plan

## 2024-04-11 NOTE — Assessment & Plan Note (Signed)
 On exam 04/11/2024 he was alert and gave a detailed history.  There is a discrepancy between his account of the seizure frequency and what is recorded in matrix notes.  As noted he describes 5 seizures at approximately 1 AM on 8/25; but he did not report this to the nurse until 1:57 PM. Most recent MMSE assessment score: BCAT 11/30/2023 revealing a score of 30 out of 50 indicating mild neurocognitive deficit. BIMS 03/05/2024, score was 15.

## 2024-04-11 NOTE — Assessment & Plan Note (Addendum)
 Keppra  was increased from 500 mg to 750 mg. Neurology consult will be pursued because of the recurrence of seizures.

## 2024-04-11 NOTE — Patient Instructions (Signed)
 See assessment and plan under each diagnosis in the problem list and acutely for this visit

## 2024-04-11 NOTE — Assessment & Plan Note (Signed)
 12/28/2023 current A1c is 5.5%, prediabetic.  This is on Farxiga 10 mg daily and metformin 500 mg daily.  A1c will be updated.

## 2024-04-12 ENCOUNTER — Non-Acute Institutional Stay: Payer: Self-pay | Admitting: Adult Health

## 2024-04-12 ENCOUNTER — Other Ambulatory Visit (HOSPITAL_COMMUNITY)
Admission: RE | Admit: 2024-04-12 | Discharge: 2024-04-12 | Disposition: A | Discharge status: Home / Self Care | Source: Skilled Nursing Facility | Attending: Internal Medicine | Admitting: Internal Medicine

## 2024-04-12 DIAGNOSIS — R569 Unspecified convulsions: Secondary | ICD-10-CM

## 2024-04-12 DIAGNOSIS — E1169 Type 2 diabetes mellitus with other specified complication: Secondary | ICD-10-CM | POA: Diagnosis present

## 2024-04-12 LAB — COMPREHENSIVE METABOLIC PANEL WITH GFR
ALT: 16 U/L (ref 0–44)
AST: 11 U/L — ABNORMAL LOW (ref 15–41)
Albumin: 2.9 g/dL — ABNORMAL LOW (ref 3.5–5.0)
Alkaline Phosphatase: 54 U/L (ref 38–126)
Anion gap: 10 (ref 5–15)
BUN: 9 mg/dL (ref 8–23)
CO2: 23 mmol/L (ref 22–32)
Calcium: 9.1 mg/dL (ref 8.9–10.3)
Chloride: 105 mmol/L (ref 98–111)
Creatinine, Ser: 0.72 mg/dL (ref 0.61–1.24)
GFR, Estimated: >60 mL/min (ref >60)
Glucose, Bld: 171 mg/dL — ABNORMAL HIGH (ref 70–99)
Potassium: 3.8 mmol/L (ref 3.5–5.1)
Sodium: 138 mmol/L (ref 135–145)
Total Bilirubin: 0.7 mg/dL (ref 0.0–1.2)
Total Protein: 6.3 g/dL — ABNORMAL LOW (ref 6.5–8.1)

## 2024-04-12 LAB — CBC WITH DIFFERENTIAL/PLATELET
Abs Immature Granulocytes: 0.04 K/uL (ref 0.00–0.07)
Basophils Absolute: 0.1 K/uL (ref 0.0–0.1)
Basophils Relative: 1 %
Eosinophils Absolute: 0.4 K/uL (ref 0.0–0.5)
Eosinophils Relative: 5 %
HCT: 45.3 % (ref 39.0–52.0)
Hemoglobin: 13.7 g/dL (ref 13.0–17.0)
Immature Granulocytes: 0 %
Lymphocytes Relative: 30 %
Lymphs Abs: 2.9 K/uL (ref 0.7–4.0)
MCH: 27.6 pg (ref 26.0–34.0)
MCHC: 30.2 g/dL (ref 30.0–36.0)
MCV: 91.1 fL (ref 80.0–100.0)
Monocytes Absolute: 1 K/uL (ref 0.1–1.0)
Monocytes Relative: 10 %
Neutro Abs: 5.3 K/uL (ref 1.7–7.7)
Neutrophils Relative %: 54 %
Platelets: 200 K/uL (ref 150–400)
RBC: 4.97 MIL/uL (ref 4.22–5.81)
RDW: 15.9 % — ABNORMAL HIGH (ref 11.5–15.5)
WBC: 9.7 K/uL (ref 4.0–10.5)
nRBC: 0 % (ref 0.0–0.2)

## 2024-04-12 LAB — HEMOGLOBIN A1C
Hgb A1c MFr Bld: 5.7 % — ABNORMAL HIGH (ref 4.8–5.6)
Mean Plasma Glucose: 116.89 mg/dL

## 2024-04-12 LAB — TSH: TSH: 1.078 u[IU]/mL (ref 0.350–4.500)

## 2024-04-12 NOTE — Assessment & Plan Note (Addendum)
 Neurologic deficits unchanged.  03/22/24 CT scan revealed stable post CVA & craniotomy changes without acute changes

## 2024-04-12 NOTE — Progress Notes (Signed)
 No note

## 2024-04-17 ENCOUNTER — Non-Acute Institutional Stay (SKILLED_NURSING_FACILITY): Payer: Self-pay | Admitting: Adult Health

## 2024-04-17 ENCOUNTER — Encounter: Payer: Self-pay | Admitting: Adult Health

## 2024-04-17 DIAGNOSIS — E1149 Type 2 diabetes mellitus with other diabetic neurological complication: Secondary | ICD-10-CM | POA: Diagnosis not present

## 2024-04-17 DIAGNOSIS — I152 Hypertension secondary to endocrine disorders: Secondary | ICD-10-CM | POA: Diagnosis not present

## 2024-04-17 DIAGNOSIS — F22 Delusional disorders: Secondary | ICD-10-CM | POA: Diagnosis not present

## 2024-04-17 DIAGNOSIS — E1159 Type 2 diabetes mellitus with other circulatory complications: Secondary | ICD-10-CM | POA: Diagnosis not present

## 2024-04-17 NOTE — Progress Notes (Signed)
 Location:  [facility] Room Number: 113 D Place of Service:  SNF (31)   CODE STATUS: DNR/DNH  Allergies  Allergen Reactions   Cheese    Penicillins Hives    Has patient had a PCN reaction causing immediate rash, facial/tongue/throat swelling, SOB or lightheadedness with hypotension: Unknown Has patient had a PCN reaction causing severe rash involving mucus membranes or skin necrosis: Unknown Has patient had a PCN reaction that required hospitalization: No Has patient had a PCN reaction occurring within the last 10 years: No If all of the above answers are NO, then may proceed with Cephalosporin use.     Chief Complaint  Patient presents with   Medical Management of Chronic Issues          Type 2 diabetes mellitus with neurological complications: Hypertension associated with type 2 diabetes mellitus: Delusions/paranoia    HPI:  He is a 63 y.o. long term resident of this facility being seen for the management of his chronic illnesses:  Type 2 diabetes mellitus with neurological complications: Hypertension associated with type 2 diabetes mellitus: Delusions/paranoia. He continues to get out of bed daily; he has had seizure activity requiring an increase in his keppra  dosing. He is awaiting a follow up with neurology.    Past Medical History:  Diagnosis Date   A-fib [facility])    DM (diabetes mellitus) (HCC)    History of seizures    Hypertension    Stroke [facility])     Past Surgical History:  Procedure Laterality Date   CHOLECYSTECTOMY     COLONOSCOPY WITH PROPOFOL  N/A 01/11/2023   Procedure: COLONOSCOPY WITH PROPOFOL ;  Surgeon: Shaaron Lamar HERO, MD;  [facility];  Service: Endoscopy;  Laterality: N/A;   CRANIECTOMY Right 04/01/2017   Procedure: RIGHT DECOMPRESSIVE CRANIECTOMY;  Surgeon: Ditty, Morene Hicks, MD;  Location: [facility] OR;  Service: Neurosurgery;  Laterality: Right;   ESOPHAGOGASTRODUODENOSCOPY N/A 04/13/2017   Procedure:  ESOPHAGOGASTRODUODENOSCOPY (EGD);  Surgeon: Sebastian Moles, MD;  Location: [facility] ENDOSCOPY;  Service: General;  Laterality: N/A;  bedside   ESOPHAGOGASTRODUODENOSCOPY (EGD) WITH PROPOFOL  N/A 01/11/2023   Procedure: ESOPHAGOGASTRODUODENOSCOPY (EGD) WITH PROPOFOL ;  Surgeon: Shaaron Lamar HERO, MD;  [facility];  Service: Endoscopy;  Laterality: N/A;   GIVENS CAPSULE STUDY N/A 05/15/2023   Procedure: GIVENS CAPSULE STUDY;  Surgeon: Shaaron Lamar HERO, MD;  [facility];  Service: Endoscopy;  Laterality: N/A;  730am, [facility]   INCISION AND DRAINAGE PERIRECTAL ABSCESS N/A 09/28/2017   Procedure: IRRIGATION AND DEBRIDEMENT PERIANAL ABSCESS;  Surgeon: Kallie Manuelita BROCKS, MD;  Location: [facility] ORS;  Service: General;  Laterality: N/A;   PEG PLACEMENT N/A 04/13/2017   Procedure: PERCUTANEOUS ENDOSCOPIC GASTROSTOMY (PEG) PLACEMENT;  Surgeon: Sebastian Moles, MD;  Location: [facility] ENDOSCOPY;  Service: General;  Laterality: N/A;   POLYPECTOMY  01/11/2023   Procedure: POLYPECTOMY INTESTINAL;  Surgeon: Shaaron Lamar HERO, MD;  [facility]: [facility];  Service: Endoscopy;;    Social History   Socioeconomic History   Marital status: Widowed    Spouse name: Not on file   Number of children: Not on file   Years of education: Not on file   Highest education level: Not on file  Occupational History   Not on file  Tobacco Use   Smoking status: Former    Types: Cigarettes   Smokeless tobacco: Never   Tobacco comments:    UTA  Vaping Use   Vaping status: Never Used  Substance and Sexual  Activity   Alcohol use: No    Comment: UTA   Drug use: No    Comment: UTA   Sexual activity: Not Currently    Birth control/protection: None  Other Topics Concern   Not on file  Social History Narrative   Not on file   Social Drivers of Health   Financial Resource Strain: Not on file  Food Insecurity: No Food Insecurity (01/09/2023)   Hunger Vital Sign    Worried About Running Out of Food in the Last  Year: Never true    Ran Out of Food in the Last Year: Never true  Transportation Needs: No Transportation Needs (01/09/2023)   PRAPARE - Administrator, Civil Service (Medical): No    Lack of Transportation (Non-Medical): No  Physical Activity: Not on file  Stress: Not on file  Social Connections: Not on file  Intimate Partner Violence: Not At Risk (01/09/2023)   Humiliation, Afraid, Rape, and Kick questionnaire    Fear of Current or Ex-Partner: No    Emotionally Abused: No    Physically Abused: No    Sexually Abused: No   Family History  Problem Relation Age of Onset   Hypertension Father    Colon cancer Neg Hx    Colon polyps Neg Hx       VITAL SIGNS BP (!) 104/59   Pulse 88   Temp 98 F (36.7 C)   Resp 20   Ht 5' 7 (1.702 m)   Wt 182 lb 3.2 oz (82.6 kg)   SpO2 93%   BMI 28.54 kg/m   Outpatient Encounter Medications as of 04/17/2024  Medication Sig   acetaminophen  (TYLENOL ) 650 MG CR tablet Take 650 mg by mouth every 6 (six) hours.   apixaban  (ELIQUIS ) 5 MG TABS tablet Take 5 mg by mouth 2 (two) times daily.    atorvastatin  (LIPITOR) 20 MG tablet Take 20 mg by mouth at bedtime.   Cholecalciferol  1.25 MG (50000 UT) capsule Take 50,000 Units by mouth every Monday. For Low Vitamin D  Level on Monday   dapagliflozin propanediol (FARXIGA) 10 MG TABS tablet Take 10 mg by mouth daily.   Dextromethorphan -quiNIDine (NUEDEXTA) 20-10 MG capsule Take 1 capsule by mouth 2 (two) times daily.   ferrous sulfate 325 (65 FE) MG EC tablet Take 325 mg by mouth daily. Mon, Wed & Fri.   gabapentin  (NEURONTIN ) 100 MG capsule Take 100 mg by mouth at bedtime.   levETIRAcetam  (KEPPRA  XR) 750 MG 24 hr tablet Take 750 mg by mouth daily.   loratadine (CLARITIN) 10 MG tablet Take 10 mg by mouth daily as needed.   Menthol, Topical Analgesic, (BIOFREEZE) 4 % GEL Apply 1 application  topically at bedtime as needed. Apply to left shoulder   metformin (FORTAMET) 500 MG (OSM) 24 hr tablet Take  1,000 mg by mouth as directed. 1000 mg; oral Once A Day 10:00 AM   midazolam  (VERSED ) 5 MG/ML injection Inject into the vein as directed. amt: 10mg ; injection Special Instructions: Administer 10mg  (one 2ml vial) IM every 15 minutes up to (4) total doses for seizures lasting >1 minute - As Needed PRN 1, PRN 2, PRN 3, PRN 4, PRN 5, PRN 6   NON FORMULARY Diet:Regular   polyethylene glycol (MIRALAX  / GLYCOLAX ) 17 g packet Take 17 g by mouth daily. 9 pm   QUEtiapine  (SEROQUEL ) 100 MG tablet Take 100 mg by mouth at bedtime.   QUEtiapine  (SEROQUEL ) 25 MG tablet Take 75 mg by mouth  daily. At 10 am   sennosides-docusate sodium  (SENOKOT-S) 8.6-50 MG tablet Take 1 tablet by mouth daily as needed for constipation.   guaiFENesin -dextromethorphan  (ROBITUSSIN DM) 100-10 MG/5ML syrup Take 15 mLs by mouth every 6 (six) hours as needed for cough. (Patient not taking: Reported on 04/17/2024)   metFORMIN (GLUCOPHAGE-XR) 500 MG 24 hr tablet Take 500 mg by mouth daily with breakfast. (Patient not taking: Reported on 04/17/2024)   midazolam  PF (VERSED ) 2 MG/2ML SOLN injection Inject 2 mLs (2 mg total) into the muscle every 15 (fifteen) minutes as needed for agitation. (Patient not taking: Reported on 04/17/2024)   No facility-administered encounter medications on file as of 04/17/2024.     SIGNIFICANT DIAGNOSTIC EXAMS  PREVIOUS   06-10-21: thyroid  biopsy   01-11-23: EGD: normal esophagus; stomach, doudenal bulb and 2dn portion of duodenum; peg scar apparent   01-11-23: colonoscopy:  Two 3-5 mm polyp at hepatic flexure and in the cecum; removed with cold snare Diverticulosis in sigmoid colon and in the descending colon, redundant elongated colon Otherwise normal Doubt signs of GI bleed; may restart eliquis .   NO NEW EXAMS    LABS REVIEWED PREVIOUS:     04-17-23 hgb A1c 7.7  05-18-23: PSA 3.15 07-21-23: wbc 9.6; hgb 14.6; hct 48.9; mcv 95.0 plt 248; glucose 201; bun 15; creat 0.91; k+ 3.9; na++ 137; ca 9.4 gfr  >60  08-04-23: glucose 201; bun 15; creat 0.81; k+ 3.9; na++ 137; ca 9.4 gfr >60 08-14-23: chol 98; ldl 44 trig 120 hdl 30 hgb A1c 6.3 09-11-23: hgb 14.9; hct 50.0 iron 88; tibc 300  10-17-23: glucose 183; bun 12; creat 0.92; k+ 4.3; na++ 138; ca 9.8; gfr >60 11-24-23: urine micro-albumin 17.0 12-28-23: wbc 11.9; hgb 13.0; hct 44.6; mcv 94.3 plt 214; glucose 139; bun 12; creat 0.65; k+ 3.7; na++ 136; ca 8.7; gfr >60; protein 6.7 albumin 3.0; tsh 2.575' free t3: 2.8 free t4: 0.78; hgb A1c 5.5     NO NEW LABS.   Review of Systems  Constitutional:  Negative for malaise/fatigue.  Respiratory:  Negative for cough and shortness of breath.   Cardiovascular:  Negative for chest pain, palpitations and leg swelling.  Gastrointestinal:  Negative for abdominal pain, constipation and heartburn.  Musculoskeletal:  Negative for back pain, joint pain and myalgias.  Skin: Negative.   Neurological:  Positive for seizures and headaches. Negative for dizziness.  Psychiatric/Behavioral:  The patient is not nervous/anxious.     Physical Exam Constitutional:      General: He is not in acute distress.    Appearance: He is well-developed. He is not diaphoretic.  Neck:     Thyroid : Thyroid  mass and thyromegaly present.  Cardiovascular:     Rate and Rhythm: Normal rate and regular rhythm.     Heart sounds: Normal heart sounds.  Pulmonary:     Effort: Pulmonary effort is normal. No respiratory distress.     Breath sounds: Normal breath sounds.  Abdominal:     General: Bowel sounds are normal. There is no distension.     Palpations: Abdomen is soft.     Tenderness: There is no abdominal tenderness.  Musculoskeletal:     Cervical back: Neck supple.     Right lower leg: No edema.     Left lower leg: No edema.     Comments: Left hemiplegia  Lymphadenopathy:     Cervical: No cervical adenopathy.  Skin:    General: Skin is warm and dry.  Neurological:  Mental Status: He is alert. Mental status is at  baseline.     Comments: SLUMS 17/30  Psychiatric:        Mood and Affect: Mood normal.       ASSESSMENT/ PLAN:  TODAY;  Type 2 diabetes mellitus with neurological complications: hgb A1c 5.5; will continue metformin xr 1 gm daily farxiga 10 mg daily   2. Hypertension associated with type 2 diabetes mellitus: b/p 104/59 is presently off medications  3. Delusions/paranoia is on seroquel  75 mg in the AM and 100 mg in the PM does have some occasional boutbursts.   PREVIOUS   4. PAF (paroxysmal atrial fibrillation) heart rate is stable will continue eliquis  5 mg twice daily   5 .PBA (pseudobulbar affect) He has few verbal outburst with neudexta will continue this medication twice daily   6. Hypochromic anemia With hgb 14.9; his anemia has resolved; has a normal iron panel   7. Protein-calorie malnutrition, severe (HCC) His albumin is 2.5 he has declined supplements and prosource.   8. Dyslipidemia associated with type 2 diabetes mellitus: ldl 44 will continue lipitor 20 mg daily   9. Diabetic peripheral neuropathy: will continue gabapentin  100 mg nightly  10. Seizures: no recent activities: will continue keppra  xl 750 mg daily    11. Chronic constipation: will continue senna s daily as needed  12. GERD without esophagitis: is off PPI  13. 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 Vitamin D  deficiency: 43.99 is off supplement  15. 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   16. Multinodular goiter: is status post aspiration: seen by endocrinology 05-04-23.        Barnie Seip NP [facility]  call [phone]

## 2024-04-22 ENCOUNTER — Non-Acute Institutional Stay (SKILLED_NURSING_FACILITY): Payer: Self-pay | Admitting: Family Medicine

## 2024-04-22 DIAGNOSIS — F482 Pseudobulbar affect: Secondary | ICD-10-CM

## 2024-04-22 DIAGNOSIS — R569 Unspecified convulsions: Secondary | ICD-10-CM

## 2024-04-22 DIAGNOSIS — E042 Nontoxic multinodular goiter: Secondary | ICD-10-CM

## 2024-04-22 DIAGNOSIS — I480 Paroxysmal atrial fibrillation: Secondary | ICD-10-CM | POA: Diagnosis not present

## 2024-04-22 DIAGNOSIS — E1151 Type 2 diabetes mellitus with diabetic peripheral angiopathy without gangrene: Secondary | ICD-10-CM

## 2024-04-22 DIAGNOSIS — Z8719 Personal history of other diseases of the digestive system: Secondary | ICD-10-CM

## 2024-04-22 DIAGNOSIS — Z8673 Personal history of transient ischemic attack (TIA), and cerebral infarction without residual deficits: Secondary | ICD-10-CM

## 2024-04-22 NOTE — Progress Notes (Signed)
 Location:  [facility]   Place of Service:    [facility]    CODE STATUS: Full Code   Allergies  Allergen Reactions   Cheese    Penicillins Hives    Has patient had a PCN reaction causing immediate rash, facial/tongue/throat swelling, SOB or lightheadedness with hypotension: Unknown Has patient had a PCN reaction causing severe rash involving mucus membranes or skin necrosis: Unknown Has patient had a PCN reaction that required hospitalization: No Has patient had a PCN reaction occurring within the last 10 years: No If all of the above answers are NO, then may proceed with Cephalosporin use.     No chief complaint on file.   HPI:    Past Medical History:  Diagnosis Date   A-fib (HCC)    DM (diabetes mellitus) (HCC)    History of seizures    Hypertension    Stroke [facility])     Past Surgical History:  Procedure Laterality Date   CHOLECYSTECTOMY     COLONOSCOPY WITH PROPOFOL  N/A 01/11/2023   Procedure: COLONOSCOPY WITH PROPOFOL ;  Surgeon: Shaaron Lamar HERO, MD;  [facility];  Service: Endoscopy;  Laterality: N/A;   CRANIECTOMY Right 04/01/2017   Procedure: RIGHT DECOMPRESSIVE CRANIECTOMY;  Surgeon: Ditty, Morene Hicks, MD;  Location: [facility] OR;  Service: Neurosurgery;  Laterality: Right;   ESOPHAGOGASTRODUODENOSCOPY N/A 04/13/2017   Procedure: ESOPHAGOGASTRODUODENOSCOPY (EGD);  Surgeon: Sebastian Moles, MD;  Location: [facility] ENDOSCOPY;  Service: General;  Laterality: N/A;  bedside   ESOPHAGOGASTRODUODENOSCOPY (EGD) WITH PROPOFOL  N/A 01/11/2023   Procedure: ESOPHAGOGASTRODUODENOSCOPY (EGD) WITH PROPOFOL ;  Surgeon: Shaaron Lamar HERO, MD;  [facility];  Service: Endoscopy;  Laterality: N/A;   GIVENS CAPSULE STUDY N/A 05/15/2023   Procedure: GIVENS CAPSULE STUDY;  Surgeon: Shaaron Lamar HERO, MD;  [facility];  Service: Endoscopy;  Laterality: N/A;  730am, [facility]   INCISION AND DRAINAGE PERIRECTAL ABSCESS N/A 09/28/2017   Procedure:  IRRIGATION AND DEBRIDEMENT PERIANAL ABSCESS;  Surgeon: Kallie Manuelita BROCKS, MD;  Location: [facility] ORS;  Service: General;  Laterality: N/A;   PEG PLACEMENT N/A 04/13/2017   Procedure: PERCUTANEOUS ENDOSCOPIC GASTROSTOMY (PEG) PLACEMENT;  Surgeon: Sebastian Moles, MD;  Location: [facility] ENDOSCOPY;  Service: General;  Laterality: N/A;   POLYPECTOMY  01/11/2023   Procedure: POLYPECTOMY INTESTINAL;  Surgeon: Shaaron Lamar HERO, MD;  [facility];  Service: Endoscopy;;    Social History   Socioeconomic History   Marital status: Widowed    Spouse name: Not on file   Number of children: Not on file   Years of education: Not on file   Highest education level: Not on file  Occupational History   Not on file  Tobacco Use   Smoking status: Former    Types: Cigarettes   Smokeless tobacco: Never   Tobacco comments:    UTA  Vaping Use   Vaping status: Never Used  Substance and Sexual Activity   Alcohol use: No    Comment: UTA   Drug use: No    Comment: UTA   Sexual activity: Not Currently    Birth control/protection: None  Other Topics Concern   Not on file  Social History Narrative   Not on file   Social Drivers of Health   Financial Resource Strain: Not on file  Food Insecurity: No Food Insecurity (01/09/2023)   Hunger Vital Sign    Worried About Running Out of Food in the Last Year: Never true    Ran  Out of Food in the Last Year: Never true  Transportation Needs: No Transportation Needs (01/09/2023)   PRAPARE - Administrator, Civil Service (Medical): No    Lack of Transportation (Non-Medical): No  Physical Activity: Not on file  Stress: Not on file  Social Connections: Not on file  Intimate Partner Violence: Not At Risk (01/09/2023)   Humiliation, Afraid, Rape, and Kick questionnaire    Fear of Current or Ex-Partner: No    Emotionally Abused: No    Physically Abused: No    Sexually Abused: No   Family History  Problem Relation Age of Onset   Hypertension Father     Colon cancer Neg Hx    Colon polyps Neg Hx       VITAL SIGNS 04/11/2024  BP 107/61   Pulse 76   Temp 97.3 F (36.3 C) Important    Resp 18   Ht 5' 7 (1.702 m)   Wt 182 lb 3.2 oz (82.6 kg)   SpO2 94%      Outpatient Encounter Medications as of 04/22/2024  Medication Sig   acetaminophen  (TYLENOL ) 650 MG CR tablet Take 650 mg by mouth every 6 (six) hours.   apixaban  (ELIQUIS ) 5 MG TABS tablet Take 5 mg by mouth 2 (two) times daily.    atorvastatin  (LIPITOR) 20 MG tablet Take 20 mg by mouth at bedtime.   Cholecalciferol  1.25 MG (50000 UT) capsule Take 50,000 Units by mouth every Monday. For Low Vitamin D  Level on Monday   dapagliflozin propanediol (FARXIGA) 10 MG TABS tablet Take 10 mg by mouth daily.   Dextromethorphan -quiNIDine (NUEDEXTA) 20-10 MG capsule Take 1 capsule by mouth 2 (two) times daily.   ferrous sulfate 325 (65 FE) MG EC tablet Take 325 mg by mouth daily. Mon, Wed & Fri.   gabapentin  (NEURONTIN ) 100 MG capsule Take 100 mg by mouth at bedtime.   guaiFENesin -dextromethorphan  (ROBITUSSIN DM) 100-10 MG/5ML syrup Take 15 mLs by mouth every 6 (six) hours as needed for cough. (Patient not taking: Reported on 04/17/2024)   levETIRAcetam  (KEPPRA  XR) 750 MG 24 hr tablet Take 750 mg by mouth daily.   loratadine (CLARITIN) 10 MG tablet Take 10 mg by mouth daily as needed.   Menthol, Topical Analgesic, (BIOFREEZE) 4 % GEL Apply 1 application  topically at bedtime as needed. Apply to left shoulder   metformin (FORTAMET) 500 MG (OSM) 24 hr tablet Take 1,000 mg by mouth as directed. 1000 mg; oral Once A Day 10:00 AM   metFORMIN (GLUCOPHAGE-XR) 500 MG 24 hr tablet Take 500 mg by mouth daily with breakfast. (Patient not taking: Reported on 04/17/2024)   midazolam  (VERSED ) 5 MG/ML injection Inject into the vein as directed. amt: 10mg ; injection Special Instructions: Administer 10mg  (one 2ml vial) IM every 15 minutes up to (4) total doses for seizures lasting >1 minute - As Needed PRN  1, PRN 2, PRN 3, PRN 4, PRN 5, PRN 6   midazolam  PF (VERSED ) 2 MG/2ML SOLN injection Inject 2 mLs (2 mg total) into the muscle every 15 (fifteen) minutes as needed for agitation. (Patient not taking: Reported on 04/17/2024)   NON FORMULARY Diet:Regular   polyethylene glycol (MIRALAX  / GLYCOLAX ) 17 g packet Take 17 g by mouth daily. 9 pm   QUEtiapine  (SEROQUEL ) 100 MG tablet Take 100 mg by mouth at bedtime.   QUEtiapine  (SEROQUEL ) 25 MG tablet Take 75 mg by mouth daily. At 10 am   sennosides-docusate sodium  (SENOKOT-S) 8.6-50 MG tablet Take  1 tablet by mouth daily as needed for constipation.   No facility-administered encounter medications on file as of 04/22/2024.     SIGNIFICANT DIAGNOSTIC EXAMS       ASSESSMENT/ PLAN:  Assessment & Plan DM (diabetes mellitus), type 2 with peripheral vascular complications (HCC) Last A1c 5.7. Well controlled on metformin 500 mg once daily and  Seizure (HCC)  AF (paroxysmal atrial fibrillation) (HCC)  History of CVA in adulthood  PBA (pseudobulbar affect)  History of GI bleed      Areta Saliva  PGY-3 [facility]

## 2024-04-22 NOTE — Assessment & Plan Note (Signed)
 Last A1c 5.7. Well controlled on metformin 500 mg once daily as well as farxiga 10 mg and no significant side effects.  - Continue metformin and farxiga.

## 2024-04-23 NOTE — Assessment & Plan Note (Signed)
 Recent TSH during inpatient stay was controlled. No change in size of goiter.  - can consider recheck of TSH in one year unless there are further changes in patient symptoms or size of goiter.

## 2024-04-23 NOTE — Assessment & Plan Note (Signed)
 Well controlled at this time.  - continue eliquis 

## 2024-04-23 NOTE — Assessment & Plan Note (Signed)
 Controlled since increase of keppra  from 500 to 750 mg daily.  Unsure of cause of seizures. No other neurologic changes. CT scan revealed right encephalomalacia sparing only the right ACA and PCA territories.  Right lateral ventricular and generalized ventricular enlargement were present.  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  These findings are in the context of history of stroke in 2018 for which craniotomy was necessary.  Could be related to headaches patient had been having previously. Symptoms are controlled as of now - Follow up after neurology appointment scheduled  - Midazolam  as needed for seizure rescue

## 2024-04-23 NOTE — Assessment & Plan Note (Signed)
 Stable mood on nudexta.  - Continue medication.

## 2024-04-23 NOTE — Assessment & Plan Note (Signed)
 No signs of symptoms of anemia or GI bleed. GERD symptoms controlled at this time.

## 2024-04-25 ENCOUNTER — Ambulatory Visit (HOSPITAL_COMMUNITY): Admission: RE | Admit: 2024-04-25 | Payer: Medicaid Other | Source: Ambulatory Visit

## 2024-04-26 ENCOUNTER — Telehealth: Payer: Self-pay | Admitting: "Endocrinology

## 2024-04-26 DIAGNOSIS — E042 Nontoxic multinodular goiter: Secondary | ICD-10-CM

## 2024-04-26 NOTE — Telephone Encounter (Signed)
 Labs updated and sent to [facility].

## 2024-04-26 NOTE — Telephone Encounter (Signed)
 Pt needs labs updated

## 2024-04-26 NOTE — Telephone Encounter (Signed)
 Can you let me know when these are updated I need to fax to [facility]

## 2024-04-30 ENCOUNTER — Other Ambulatory Visit (HOSPITAL_COMMUNITY)
Admission: RE | Admit: 2024-04-30 | Discharge: 2024-04-30 | Disposition: A | Discharge status: Home / Self Care | Source: Skilled Nursing Facility | Attending: Internal Medicine | Admitting: Internal Medicine

## 2024-04-30 DIAGNOSIS — E042 Nontoxic multinodular goiter: Secondary | ICD-10-CM | POA: Insufficient documentation

## 2024-04-30 LAB — TSH: TSH: 1.811 u[IU]/mL (ref 0.350–4.500)

## 2024-04-30 LAB — T4, FREE: Free T4: 0.85 ng/dL (ref 0.61–1.12)

## 2024-04-30 NOTE — Addendum Note (Signed)
 Addended by: LANDY BARNIE RAMAN on: 04/30/2024 02:28 PM   Modules accepted: Level of Service

## 2024-05-01 LAB — T3, FREE: T3, Free: 3 pg/mL (ref 2.0–4.4)

## 2024-05-03 ENCOUNTER — Ambulatory Visit (HOSPITAL_COMMUNITY)
Admission: RE | Admit: 2024-05-03 | Discharge: 2024-05-03 | Disposition: A | Discharge status: Home / Self Care | Source: Ambulatory Visit | Attending: "Endocrinology | Admitting: "Endocrinology

## 2024-05-03 DIAGNOSIS — E042 Nontoxic multinodular goiter: Secondary | ICD-10-CM | POA: Insufficient documentation

## 2024-05-06 ENCOUNTER — Encounter: Payer: Self-pay | Admitting: "Endocrinology

## 2024-05-06 ENCOUNTER — Ambulatory Visit (INDEPENDENT_AMBULATORY_CARE_PROVIDER_SITE_OTHER): Payer: Medicaid Other | Admitting: "Endocrinology

## 2024-05-06 VITALS — BP 110/64 | HR 76

## 2024-05-06 DIAGNOSIS — E042 Nontoxic multinodular goiter: Secondary | ICD-10-CM

## 2024-05-06 NOTE — Progress Notes (Signed)
 05/06/2024, 12:22 PM  Endocrinology follow-up note  Subjective:    Patient ID: Joe Wilkinson, male    DOB: 02-26-1961, PCP Landy Barnie RAMAN, NP   Past Medical History:  Diagnosis Date   A-fib [facility])    DM (diabetes mellitus) (HCC)    History of seizures    Hypertension    Stroke [facility])    Past Surgical History:  Procedure Laterality Date   CHOLECYSTECTOMY     COLONOSCOPY WITH PROPOFOL  N/A 01/11/2023   Procedure: COLONOSCOPY WITH PROPOFOL ;  Surgeon: Shaaron Lamar HERO, MD;  Location: [facility];  Service: Endoscopy;  Laterality: N/A;   CRANIECTOMY Right 04/01/2017   Procedure: RIGHT DECOMPRESSIVE CRANIECTOMY;  Surgeon: Ditty, Morene Hicks, MD;  Location: [facility] OR;  Service: Neurosurgery;  Laterality: Right;   ESOPHAGOGASTRODUODENOSCOPY N/A 04/13/2017   Procedure: ESOPHAGOGASTRODUODENOSCOPY (EGD);  Surgeon: Sebastian Moles, MD;  Location: [facility] ENDOSCOPY;  Service: General;  Laterality: N/A;  bedside   ESOPHAGOGASTRODUODENOSCOPY (EGD) WITH PROPOFOL  N/A 01/11/2023   Procedure: ESOPHAGOGASTRODUODENOSCOPY (EGD) WITH PROPOFOL ;  Surgeon: Shaaron Lamar HERO, MD;  Location: [facility];  Service: Endoscopy;  Laterality: N/A;   GIVENS CAPSULE STUDY N/A 05/15/2023   Procedure: GIVENS CAPSULE STUDY;  Surgeon: Shaaron Lamar HERO, MD;  Location: [facility];  Service: Endoscopy;  Laterality: N/A;  730am, at [facility]   INCISION AND DRAINAGE PERIRECTAL ABSCESS N/A 09/28/2017   Procedure: IRRIGATION AND DEBRIDEMENT PERIANAL ABSCESS;  Surgeon: Kallie Manuelita BROCKS, MD;  Location: [facility] ORS;  Service: General;  Laterality: N/A;   PEG PLACEMENT N/A 04/13/2017   Procedure: PERCUTANEOUS ENDOSCOPIC GASTROSTOMY (PEG) PLACEMENT;  Surgeon: Sebastian Moles, MD;  Location: [facility] ENDOSCOPY;  Service: General;  Laterality: N/A;   POLYPECTOMY  01/11/2023   Procedure: POLYPECTOMY INTESTINAL;  Surgeon: Shaaron Lamar HERO, MD;  Location: [facility];  Service: Endoscopy;;    Social History   Socioeconomic History   Marital status: Widowed    Spouse name: Not on file   Number of children: Not on file   Years of education: Not on file   Highest education level: Not on file  Occupational History   Not on file  Tobacco Use   Smoking status: Former    Types: Cigarettes   Smokeless tobacco: Never   Tobacco comments:    UTA  Vaping Use   Vaping status: Never Used  Substance and Sexual Activity   Alcohol use: No    Comment: UTA   Drug use: No    Comment: UTA   Sexual activity: Not Currently    Birth control/protection: None  Other Topics Concern   Not on file  Social History Narrative   Not on file   Social Drivers of Health   Financial Resource Strain: Not on file  Food Insecurity: No Food Insecurity (01/09/2023)   Hunger Vital Sign    Worried About Running Out of Food in the Last Year: Never true    Ran Out of Food in the Last Year: Never true  Transportation Needs: No Transportation Needs (01/09/2023)   PRAPARE - Administrator, Civil Service (Medical): No    Lack of Transportation (Non-Medical): No  Physical Activity: Not on file  Stress: Not on file  Social Connections: Not on file   Family History  Problem Relation Age of Onset   Hypertension Father    Colon cancer Neg Hx    Colon polyps Neg Hx    Outpatient Encounter Medications as of 05/06/2024  Medication Sig   acetaminophen  (TYLENOL ) 650 MG CR tablet Take 650 mg by mouth every 6 (six) hours.   apixaban  (ELIQUIS ) 5 MG TABS tablet Take 5 mg by mouth 2 (two) times daily.    atorvastatin  (LIPITOR) 20 MG tablet Take 20 mg by mouth at bedtime.   Cholecalciferol  1.25 MG (50000 UT) capsule Take 50,000 Units by mouth every Monday. For Low Vitamin D  Level on Monday   dapagliflozin propanediol (FARXIGA) 10 MG TABS tablet Take 10 mg by mouth daily.   Dextromethorphan -quiNIDine (NUEDEXTA) 20-10 MG capsule Take 1 capsule by mouth 2 (two) times daily.   ferrous sulfate 325 (65 FE)  MG EC tablet Take 325 mg by mouth daily. Mon, Wed & Fri.   gabapentin  (NEURONTIN ) 100 MG capsule Take 100 mg by mouth at bedtime.   guaiFENesin -dextromethorphan  (ROBITUSSIN DM) 100-10 MG/5ML syrup Take 15 mLs by mouth every 6 (six) hours as needed for cough. (Patient not taking: Reported on 04/17/2024)   levETIRAcetam  (KEPPRA  XR) 750 MG 24 hr tablet Take 750 mg by mouth daily.   loratadine (CLARITIN) 10 MG tablet Take 10 mg by mouth daily as needed.   Menthol, Topical Analgesic, (BIOFREEZE) 4 % GEL Apply 1 application  topically at bedtime as needed. Apply to left shoulder   metformin (FORTAMET) 500 MG (OSM) 24 hr tablet Take 1,000 mg by mouth as directed. 1000 mg; oral Once A Day 10:00 AM   metFORMIN (GLUCOPHAGE-XR) 500 MG 24 hr tablet Take 500 mg by mouth daily with breakfast. (Patient not taking: Reported on 04/17/2024)   midazolam  (VERSED ) 5 MG/ML injection Inject into the vein as directed. amt: 10mg ; injection Special Instructions: Administer 10mg  (one 2ml vial) IM every 15 minutes up to (4) total doses for seizures lasting >1 minute - As Needed PRN 1, PRN 2, PRN 3, PRN 4, PRN 5, PRN 6   midazolam  PF (VERSED ) 2 MG/2ML SOLN injection Inject 2 mLs (2 mg total) into the muscle every 15 (fifteen) minutes as needed for agitation. (Patient not taking: Reported on 04/17/2024)   NON FORMULARY Diet:Regular   polyethylene glycol (MIRALAX  / GLYCOLAX ) 17 g packet Take 17 g by mouth daily. 9 pm   QUEtiapine  (SEROQUEL ) 100 MG tablet Take 100 mg by mouth at bedtime.   QUEtiapine  (SEROQUEL ) 25 MG tablet Take 75 mg by mouth daily. At 10 am   sennosides-docusate sodium  (SENOKOT-S) 8.6-50 MG tablet Take 1 tablet by mouth daily as needed for constipation.   No facility-administered encounter medications on file as of 05/06/2024.   ALLERGIES: Allergies  Allergen Reactions   Cheese    Penicillins Hives    Has patient had a PCN reaction causing immediate rash, facial/tongue/throat swelling, SOB or lightheadedness  with hypotension: Unknown Has patient had a PCN reaction causing severe rash involving mucus membranes or skin necrosis: Unknown Has patient had a PCN reaction that required hospitalization: No Has patient had a PCN reaction occurring within the last 10 years: No If all of the above answers are NO, then may proceed with Cephalosporin use.     VACCINATION STATUS: Immunization History  Administered Date(s) Administered    sv, Bivalent, Protein Subunit Rsvpref,pf [place]) 07/11/2022   Influenza,inj,Quad PF,6+ Mos 05/29/2023   Influenza-Unspecified 05/13/2019, 05/15/2020, 05/10/2022  Moderna Covid-19 Fall Seasonal Vaccine 12yrs & older 05/16/2023   Moderna Covid-19 Vaccine Bivalent Booster 73yrs & up 07/21/2021, 09/08/2021, 06/01/2022   Moderna SARS-COV2 Booster Vaccination 06/01/2021, 12/28/2021, 05/16/2023, 11/07/2023   Moderna Sars-Covid-2 Vaccination 08/14/2019, 09/11/2019, 06/11/2020, 11/18/2020   Pneumococcal Polysaccharide-23 03/29/2020   Pneumococcal-Unspecified 05/10/2018   Tdap 01/07/2019   Zoster Recombinant(Shingrix) 12/03/2021, 03/02/2022    HPI Joe Wilkinson is 63 y.o. male who presents today with a medical history as above. he is being seen in consultation for multinodular goiter requested by Landy Barnie RAMAN, NP.  This patient was previously seen in this clinic for immobilization hypercalcemia.  He was given IV bisphosphonate and that hypercalcemia has stabilized.  Patient is a nursing home resident.  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 After he was found to have clinical goiter, on April 13, 2021 when he underwent dedicated thyroid  ultrasound.  That study revealed 8 cm right lobe with 2 nodules measuring 6.1 and 1.7 cm.  Left lobe measuring 7.2 cm with 2 nodules measuring 5.3 and 2.4 cm.  3 of these nodules were reported to be suspicious and FNA was recommended.  His biopsy results are negative.  The sample from the  right sided nodule required Afirma testing which returned approximately 4% risk of malignancy, essentially benign.    - He was put on expectant management series of thyroid  ultrasound imaging studies which continue to show progressive growth of the right lobe of his thyroid  to 10.5 cm currently. He presents with no new complaints today.  He has no major problems with swallowing, breathing, speaking. He continues to have euthyroid presentation.  He denies any family history of thyroid  malignancy. Is not accompanied by his aide from nursing home, provide his own history.  Aide accompanied him to clinic is not familiar with his medical history. Review of his records indicate that he is a DNR.  However, patient starkly expresses his desire to be resuscitated and even get surgical treatment if necessary. His medical history also includes type 2 diabetes on management with Januvia, metformin.  He also has hyperlipidemia and hypertension on treatment. -He reports steady weight, no recent significant weight change.  Denies any tremors, heat intolerance, palpitations, no diaphoresis.    Review of Systems  Constitutional: no recent weight gain/loss, no fatigue, no subjective hyperthermia, no subjective hypothermia   Objective:       05/06/2024   10:04 AM 04/17/2024    8:32 AM 04/11/2024   11:25 AM  Vitals with BMI  Height  5' 7 5' 7  Weight  182 lbs 3 oz 182 lbs 3 oz  BMI  28.53 28.53  Systolic 110 104 892  Diastolic 64 59 61  Pulse 76 88 76    BP 110/64   Pulse 76   Wt Readings from Last 3 Encounters:  04/17/24 182 lb 3.2 oz (82.6 kg)  04/11/24 182 lb 3.2 oz (82.6 kg)  03/18/24 182 lb 3.2 oz (82.6 kg)    Physical Exam  Constitutional:  There is no height or weight on file to calculate BMI.,  not in acute distress, normal state of mind, + wheelchair-bound with left-sided hemiparesis.   CMP ( most recent) CMP     Component Value Date/Time   NA 138 04/12/2024 0800   K 3.8  04/12/2024 0800   CL 105 04/12/2024 0800   CO2 23 04/12/2024 0800   GLUCOSE 171 (H) 04/12/2024 0800   BUN 9 04/12/2024 0800   CREATININE  0.72 04/12/2024 0800   CALCIUM  9.1 04/12/2024 0800   CALCIUM  11.5 (H) 09/05/2017 0709   PROT 6.3 (L) 04/12/2024 0800   ALBUMIN 2.9 (L) 04/12/2024 0800   AST 11 (L) 04/12/2024 0800   ALT 16 04/12/2024 0800   ALKPHOS 54 04/12/2024 0800   BILITOT 0.7 04/12/2024 0800   GFRNONAA >60 04/12/2024 0800   GFRAA >60 04/06/2020 0630     Diabetic Labs (most recent): Lab Results  Component Value Date   HGBA1C 5.7 (H) 04/12/2024   HGBA1C 5.5 12/28/2023   HGBA1C 6.3 (H) 08/14/2023   MICROALBUR 17.0 (H) 11/24/2023   MICROALBUR 15.3 (H) 09/29/2022   MICROALBUR 92.8 (H) 05/03/2021     Lipid Panel ( most recent) Lipid Panel     Component Value Date/Time   CHOL 98 08/14/2023 0945   TRIG 120 08/14/2023 0945   HDL 30 (L) 08/14/2023 0945   CHOLHDL 3.3 08/14/2023 0945   VLDL 24 08/14/2023 0945   LDLCALC 44 08/14/2023 0945      Lab Results  Component Value Date   TSH 1.811 04/30/2024   TSH 1.078 04/12/2024   TSH 2.575 12/28/2023   TSH 1.484 05/03/2023   TSH 1.886 02/07/2023   TSH 1.501 01/09/2023   TSH 2.255 08/31/2022   TSH 1.240 07/30/2022   TSH 3.168 07/28/2022   TSH 1.238 06/24/2022   FREET4 0.85 04/30/2024   FREET4 0.78 12/28/2023   FREET4 0.73 05/03/2023   FREET4 0.89 02/07/2023   FREET4 0.82 08/31/2022   FREET4 0.88 07/30/2022   FREET4 0.79 06/24/2022    Thyroid  ultrasound on April 13, 2021.  Right lobe: 8.0 x 5.1 x 6.6 cm, 2 nodules measuring 6.1 cm and 1.7 cm   Left lobe: 7.2 x 3.9 x 4.7 cm, 2 nodules measuring 5.3 cm and 2.4 cm  IMPRESSION: 1. Enlarged, multinodular thyroid  gland. 2. Nodule labeled 2, 3 and 4 all meet criteria for biopsy. Current recommendation is to only biopsy the 2 most suspicious nodules. Therefore nodules labeled 2 and 4 would be recommended for percutaneous biopsy.  Fine-needle aspiration biopsy on  June 10, 2021: Left-sided nodule negative.  Right thyroid  nodule atypia of undetermined significance, Afirma negative.  May 03, 2024 thyroid  ultrasound FINDINGS: Parenchymal Echotexture: Moderately heterogenous   Isthmus: 0.9 cm   Right lobe: 10.5 x 5.5 x 6.0 cm   Left lobe: 7.2 x 3.9 x 3.8 cm  ------------------------------------------------------------------------------ IMPRESSION: 1. Stable to slightly larger overall multinodular thyroid  gland. 2. Previously sampled nodules/pseudo nodules in the right mid and left mid thyroid  lobes are smaller in volume. Pseudo nodules are favored as these regions blends into adjacent parenchyma. 3. 2.4 cm nodule in the left inferior thyroid  lobe is stable in size and now appears more spongiform compared to the prior study. This does not meet criteria for biopsy or dedicated follow-up. 4. Thyroid  isthmus nodule is smaller compared to the prior study and does not meet criteria for biopsy or dedicated follow-up.  Assessment & Plan:   1. Multinodular goiter  Patient did have previous fine-needle aspiration with benign findings.  However, patient presented with continued growth of his thyroid  specially the right lobe of his thyroid .   He does not have compressive symptoms.  His presentation is consistent with euthyroid multinodular goiter, the right lobe growing to 10.5 cm .  It is only a matter of time before he develops compressive symptoms from large multinodular goiter. At today's visit, I discussed possible surgical treatment with the patient.  His records indicate  a DNR notice.  However, patient expresses his desire to be resuscitated and as well as surgical treatment if necessary. This discussion will have to be  conducted with his legal guardian/sister PMD.  It is reasonable to reconsider DNR in this patient who would benefit from simple thyroidectomy. I discussed with him the likelihood of getting on thyroid  hormone after  thyroidectomy and he does not mind.  I sent a note containing this discussion to the facility where he resides.   If this issue is clear, he will be sent for a surgeon for thyroidectomy.  He continues to have euthyroid presentation, does not need thyroid  hormone initiation at this time. - I did not initiate any new prescriptions today. - he is advised to maintain close follow up with Landy Barnie RAMAN, NP for primary care needs.   I spent  26  minutes in the care of the patient today including review of labs from Thyroid  Function, CMP, and other relevant labs ; imaging/biopsy records (current and previous including abstractions from other facilities); face-to-face time discussing  his lab results and symptoms, medications doses, his options of short and long term treatment based on the latest standards of care / guidelines;   and documenting the encounter.  Jerrell Domino  participated in the discussions, expressed understanding, and voiced agreement with the above plans.  All questions were answered to his satisfaction. he is encouraged to contact clinic should he have any questions or concerns prior to his return visit.   Follow up plan: Return in about 1 year (around 05/06/2025) for F/U with Pre-visit Labs, Thyroid  / Neck Ultrasound.   Ranny Earl, MD [facility] [address] [place], [place] [address] Phone: [phone]  Fax: [phone]     05/06/2024, 12:22 PM  This note was partially dictated with voice recognition software. Similar sounding words can be transcribed inadequately or may not  be corrected upon review.

## 2024-05-08 ENCOUNTER — Telehealth: Payer: Self-pay

## 2024-05-08 ENCOUNTER — Other Ambulatory Visit: Payer: Self-pay | Admitting: "Endocrinology

## 2024-05-08 DIAGNOSIS — E042 Nontoxic multinodular goiter: Secondary | ICD-10-CM

## 2024-05-08 NOTE — Telephone Encounter (Signed)
 Spoke with Sari Igo Director of Nursing at [facility] clarifying that pt  & his legal guardian (pt's sister) has expressed the desire for pt to see the surgeon for evaluation of needed surgery for goiter. Ms.Franks stated pt's DNR does not prevent any treatment. Dr.Nida made aware of pt & his legal guardian's request. Dr.Nida has sent a referral for a surgical consult.

## 2024-05-10 ENCOUNTER — Ambulatory Visit (HOSPITAL_COMMUNITY): Admission: RE | Admit: 2024-05-10 | Source: Ambulatory Visit

## 2024-05-22 ENCOUNTER — Telehealth: Payer: Self-pay

## 2024-05-22 NOTE — Telephone Encounter (Signed)
 Care everywhere search

## 2024-05-30 ENCOUNTER — Non-Acute Institutional Stay (SKILLED_NURSING_FACILITY): Payer: Self-pay | Admitting: Adult Health

## 2024-05-30 ENCOUNTER — Encounter: Payer: Self-pay | Admitting: Adult Health

## 2024-05-30 DIAGNOSIS — D508 Other iron deficiency anemias: Secondary | ICD-10-CM

## 2024-05-30 DIAGNOSIS — F482 Pseudobulbar affect: Secondary | ICD-10-CM

## 2024-05-30 DIAGNOSIS — I480 Paroxysmal atrial fibrillation: Secondary | ICD-10-CM

## 2024-05-30 NOTE — Progress Notes (Signed)
 Location:  [facility] Room Number: 110 Place of Service:  SNF (31)   CODE STATUS: dnr   Allergies  Allergen Reactions   Cheese    Penicillins Hives    Has patient had a PCN reaction causing immediate rash, facial/tongue/throat swelling, SOB or lightheadedness with hypotension: Unknown Has patient had a PCN reaction causing severe rash involving mucus membranes or skin necrosis: Unknown Has patient had a PCN reaction that required hospitalization: No Has patient had a PCN reaction occurring within the last 10 years: No If all of the above answers are NO, then may proceed with Cephalosporin use.     Chief Complaint  Patient presents with   Medical Management of Chronic Issues                 PAF( Paroxysmal atrial fibrillation)      PBA (pseudobulbar affect)  Hypochromic anemia    HPI:  He is a 63 y.o. long term resident of this facility being seen for the management of his chronic illnesses:   PAF( Paroxysmal atrial fibrillation)      PBA (pseudobulbar affect)  Hypochromic anemia. There are no reports of uncontrolled pain. His heart rate is stable; there are no reports of palpitations or shortness of breath    Past Medical History:  Diagnosis Date   A-fib (HCC)    DM (diabetes mellitus) (HCC)    History of seizures    Hypertension    Stroke [facility])     Past Surgical History:  Procedure Laterality Date   CHOLECYSTECTOMY     COLONOSCOPY WITH PROPOFOL  N/A 01/11/2023   Procedure: COLONOSCOPY WITH PROPOFOL ;  Surgeon: Shaaron Lamar HERO, MD;  [facility]: [facility];  Service: Endoscopy;  Laterality: N/A;   CRANIECTOMY Right 04/01/2017   Procedure: RIGHT DECOMPRESSIVE CRANIECTOMY;  Surgeon: Ditty, Morene Hicks, MD;  Location: [facility] OR;  Service: Neurosurgery;  Laterality: Right;   ESOPHAGOGASTRODUODENOSCOPY N/A 04/13/2017   Procedure: ESOPHAGOGASTRODUODENOSCOPY (EGD);  Surgeon: Sebastian Moles, MD;  Location: [facility] ENDOSCOPY;  Service: General;  Laterality:  N/A;  bedside   ESOPHAGOGASTRODUODENOSCOPY (EGD) WITH PROPOFOL  N/A 01/11/2023   Procedure: ESOPHAGOGASTRODUODENOSCOPY (EGD) WITH PROPOFOL ;  Surgeon: Shaaron Lamar HERO, MD;  [facility]: [facility];  Service: Endoscopy;  Laterality: N/A;   GIVENS CAPSULE STUDY N/A 05/15/2023   Procedure: GIVENS CAPSULE STUDY;  Surgeon: Shaaron Lamar HERO, MD;  [facility]: [facility];  Service: Endoscopy;  Laterality: N/A;  [facility]am[facility]   INCISION AND DRAINAGE PERIRECTAL ABSCESS N/A 09/28/2017   Procedure: IRRIGATION AND DEBRIDEMENT PERIANAL ABSCESS;  Surgeon: Kallie Manuelita BROCKS, MD;  Location: [facility] ORS;  Service: General;  Laterality: N/A;   PEG PLACEMENT N/A 04/13/2017   Procedure: PERCUTANEOUS ENDOSCOPIC GASTROSTOMY (PEG) PLACEMENT;  Surgeon: Sebastian Moles, MD;  Location: [facility] ENDOSCOPY;  Service: General;  Laterality: N/A;   POLYPECTOMY  01/11/2023   Procedure: POLYPECTOMY INTESTINAL;  Surgeon: Shaaron Lamar HERO, MD;  Location: [facility];  Service: Endoscopy;;    Social History   Socioeconomic History   Marital status: Widowed    Spouse name: Not on file   Number of children: Not on file   Years of education: Not on file   Highest education level: Not on file  Occupational History   Not on file  Tobacco Use   Smoking status: Former    Types: Cigarettes   Smokeless tobacco: Never   Tobacco comments:    UTA  Vaping Use   Vaping status: Never Used  Substance and Sexual Activity   Alcohol use: No    Comment: UTA   Drug use: No    Comment: UTA   Sexual activity: Not Currently    Birth control/protection: None  Other Topics Concern   Not on file  Social History Narrative   Not on file   Social Drivers of Health   Financial Resource Strain: Not on file  Food Insecurity: No Food Insecurity (01/09/2023)   Hunger Vital Sign    Worried About Running Out of Food in the Last Year: Never true    Ran Out of Food in the Last Year: Never true  Transportation Needs: No Transportation Needs (01/09/2023)    PRAPARE - Administrator, Civil Service (Medical): No    Lack of Transportation (Non-Medical): No  Physical Activity: Not on file  Stress: Not on file  Social Connections: Not on file  Intimate Partner Violence: Not At Risk (01/09/2023)   Humiliation, Afraid, Rape, and Kick questionnaire    Fear of Current or Ex-Partner: No    Emotionally Abused: No    Physically Abused: No    Sexually Abused: No   Family History  Problem Relation Age of Onset   Hypertension Father    Colon cancer Neg Hx    Colon polyps Neg Hx       VITAL SIGNS BP 107/74   Pulse 94   Temp 98.9 F (37.2 C)   Resp 16   Ht 5' 7 (1.702 m)   Wt 181 lb 6.4 oz (82.3 kg)   SpO2 98%   BMI 28.41 kg/m   Outpatient Encounter Medications as of 05/30/2024  Medication Sig   acetaminophen  (TYLENOL ) 650 MG CR tablet Take 650 mg by mouth every 6 (six) hours.   apixaban  (ELIQUIS ) 5 MG TABS tablet Take 5 mg by mouth 2 (two) times daily.    atorvastatin  (LIPITOR) 20 MG tablet Take 20 mg by mouth at bedtime.   Cholecalciferol  1.25 MG (50000 UT) capsule Take 50,000 Units by mouth every Monday. For Low Vitamin D  Level on Monday   dapagliflozin propanediol (FARXIGA) 10 MG TABS tablet Take 10 mg by mouth daily.   Dextromethorphan -quiNIDine (NUEDEXTA) 20-10 MG capsule Take 1 capsule by mouth 2 (two) times daily.   ferrous sulfate 325 (65 FE) MG EC tablet Take 325 mg by mouth daily. Mon, Wed & Fri.   gabapentin  (NEURONTIN ) 100 MG capsule Take 100 mg by mouth at bedtime.   guaiFENesin -dextromethorphan  (ROBITUSSIN DM) 100-10 MG/5ML syrup Take 15 mLs by mouth every 6 (six) hours as needed for cough. (Patient not taking: Reported on 04/17/2024)   levETIRAcetam  (KEPPRA  XR) 750 MG 24 hr tablet Take 750 mg by mouth daily.   loratadine (CLARITIN) 10 MG tablet Take 10 mg by mouth daily as needed.   Menthol, Topical Analgesic, (BIOFREEZE) 4 % GEL Apply 1 application  topically at bedtime as needed. Apply to left shoulder    metformin (FORTAMET) 500 MG (OSM) 24 hr tablet Take 1,000 mg by mouth as directed. 1000 mg; oral Once A Day 10:00 AM   metFORMIN (GLUCOPHAGE-XR) 500 MG 24 hr tablet Take 500 mg by mouth daily with breakfast. (Patient not taking: Reported on 04/17/2024)   midazolam  (VERSED ) 5 MG/ML injection Inject into the vein as directed. amt: 10mg ; injection Special Instructions: Administer 10mg  (one 2ml vial) IM every 15 minutes up to (4) total doses for seizures lasting >1 minute - As Needed PRN 1, PRN 2, PRN 3, PRN 4, PRN  5, PRN 6   midazolam  PF (VERSED ) 2 MG/2ML SOLN injection Inject 2 mLs (2 mg total) into the muscle every 15 (fifteen) minutes as needed for agitation. (Patient not taking: Reported on 04/17/2024)   NON FORMULARY Diet:Regular   polyethylene glycol (MIRALAX  / GLYCOLAX ) 17 g packet Take 17 g by mouth daily. 9 pm   QUEtiapine  (SEROQUEL ) 100 MG tablet Take 100 mg by mouth at bedtime.   QUEtiapine  (SEROQUEL ) 25 MG tablet Take 75 mg by mouth daily. At 10 am   sennosides-docusate sodium  (SENOKOT-S) 8.6-50 MG tablet Take 1 tablet by mouth daily as needed for constipation.   No facility-administered encounter medications on file as of 05/30/2024.     SIGNIFICANT DIAGNOSTIC EXAMS  PREVIOUS   06-10-21: thyroid  biopsy   01-11-23: EGD: normal esophagus; stomach, doudenal bulb and 2dn portion of duodenum; peg scar apparent   01-11-23: colonoscopy:  Two 3-5 mm polyp at hepatic flexure and in the cecum; removed with cold snare Diverticulosis in sigmoid colon and in the descending colon, redundant elongated colon Otherwise normal Doubt signs of GI bleed; may restart eliquis .   NO NEW EXAMS    LABS REVIEWED PREVIOUS:     05-18-23: PSA 3.15 07-21-23: wbc 9.6; hgb 14.6; hct 48.9; mcv 95.0 plt 248; glucose 201; bun 15; creat 0.91; k+ 3.9; na++ 137; ca 9.4 gfr >60  08-04-23: glucose 201; bun 15; creat 0.81; k+ 3.9; na++ 137; ca 9.4 gfr >60 08-14-23: chol 98; ldl 44 trig 120 hdl 30 hgb A1c 6.3 09-11-23:  hgb 14.9; hct 50.0 iron 88; tibc 300  10-17-23: glucose 183; bun 12; creat 0.92; k+ 4.3; na++ 138; ca 9.8; gfr >60 11-24-23: urine micro-albumin 17.0 12-28-23: wbc 11.9; hgb 13.0; hct 44.6; mcv 94.3 plt 214; glucose 139; bun 12; creat 0.65; k+ 3.7; na++ 136; ca 8.7; gfr >60; protein 6.7 albumin 3.0; tsh 2.575' free t3: 2.8 free t4: 0.78; hgb A1c 5.5     TODAY  04-12-24: wbc 9.7; hgb 13.7; hct 45.3; mcv 91.1 plt 200; glucose 171; bun 9; creat 0.72; k+ 3.8; na++ 138; ca 9.1; gfr >60; protein 6.3 albumin 2.9; hgb A1c 5.7; tsh 1.078 04-30-24: free t4: 0.85 free t3: 3.0 tsh 1.811   Review of Systems  Constitutional:  Negative for malaise/fatigue.  Respiratory:  Negative for cough and shortness of breath.   Cardiovascular:  Negative for chest pain, palpitations and leg swelling.  Gastrointestinal:  Negative for abdominal pain, constipation and heartburn.  Musculoskeletal:  Negative for back pain, joint pain and myalgias.  Skin: Negative.   Neurological:  Negative for dizziness.  Psychiatric/Behavioral:  The patient is not nervous/anxious.     Physical Exam Constitutional:      General: He is not in acute distress.    Appearance: He is well-developed. He is not diaphoretic.  Neck:     Thyroid : Thyroid  mass and thyromegaly present.  Cardiovascular:     Rate and Rhythm: Normal rate and regular rhythm.     Heart sounds: Normal heart sounds.  Pulmonary:     Effort: Pulmonary effort is normal. No respiratory distress.     Breath sounds: Normal breath sounds.  Abdominal:     General: Bowel sounds are normal. There is no distension.     Palpations: Abdomen is soft.     Tenderness: There is no abdominal tenderness.  Musculoskeletal:     Cervical back: Neck supple.     Right lower leg: Edema present.     Left lower leg: Edema  present.     Comments: Left hemiplegia   Lymphadenopathy:     Cervical: No cervical adenopathy.  Skin:    General: Skin is warm and dry.  Neurological:     General: No  focal deficit present.     Mental Status: He is alert.     Comments: SLUMS 17/30  Psychiatric:        Mood and Affect: Mood normal.       ASSESSMENT/ PLAN:  TODAY;  PAF( Paroxysmal atrial fibrillation) heart rate is stable will continue eliquis  5 mg twice daily   2. PBA (pseudobulbar affect) has few verbal outburst with neudexta twice daily   3. Hypochromic anemia: hgb 13.7 iron panel normal   PREVIOUS   4. Protein-calorie malnutrition, severe (HCC) His albumin is 2.5 he has declined supplements and prosource.   5. Dyslipidemia associated with type 2 diabetes mellitus: ldl 44 will continue lipitor 20 mg daily   6. Diabetic peripheral neuropathy: will continue gabapentin  100 mg nightly  7. Seizures: no recent activities: will continue keppra  xl 750 mg daily    8. Chronic constipation: will continue senna s daily as needed  9. GERD without esophagitis: is off PPI  10. 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 Vitamin D  deficiency: 43.99 is off supplement  12. 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   13. Multinodular goiter: is status post aspiration: awaiting surgical removal of goiter  14. Type 2 diabetes mellitus with neurological complications: hgb A1c 5.7; will continue metformin xr 1 gm daily farxiga 10 mg daily   15. Hypertension associated with type 2 diabetes mellitus: b/p 107/74 is presently off medications  16. Delusions/paranoia is on seroquel  75 mg in the AM and 100 mg in the PM does have some occasional boutbursts.      Barnie Seip NP [facility]  call [phone]

## 2024-06-06 ENCOUNTER — Other Ambulatory Visit (HOSPITAL_COMMUNITY)
Admission: RE | Admit: 2024-06-06 | Discharge: 2024-06-06 | Disposition: A | Discharge status: Home / Self Care | Source: Skilled Nursing Facility | Attending: Adult Health | Admitting: Adult Health

## 2024-06-06 ENCOUNTER — Ambulatory Visit: Admitting: General Surgery

## 2024-06-06 DIAGNOSIS — E042 Nontoxic multinodular goiter: Secondary | ICD-10-CM | POA: Diagnosis present

## 2024-06-06 LAB — PSA: Prostatic Specific Antigen: 3.19 ng/mL (ref 0.00–4.00)

## 2024-06-10 ENCOUNTER — Telehealth: Payer: Self-pay | Admitting: Neurology

## 2024-06-10 ENCOUNTER — Encounter: Payer: Self-pay | Admitting: Neurology

## 2024-06-10 NOTE — Telephone Encounter (Signed)
 Attempted to call patient twice, no VM box. Sent letter in mail informing pt of need to reschedule 07/29/24 appt - MD out

## 2024-06-24 ENCOUNTER — Non-Acute Institutional Stay (SKILLED_NURSING_FACILITY): Payer: Self-pay | Admitting: Adult Health

## 2024-06-24 ENCOUNTER — Encounter: Payer: Self-pay | Admitting: Adult Health

## 2024-06-24 DIAGNOSIS — E1142 Type 2 diabetes mellitus with diabetic polyneuropathy: Secondary | ICD-10-CM

## 2024-06-24 DIAGNOSIS — E43 Unspecified severe protein-calorie malnutrition: Secondary | ICD-10-CM | POA: Diagnosis not present

## 2024-06-24 DIAGNOSIS — Z7984 Long term (current) use of oral hypoglycemic drugs: Secondary | ICD-10-CM | POA: Diagnosis not present

## 2024-06-24 DIAGNOSIS — E785 Hyperlipidemia, unspecified: Secondary | ICD-10-CM

## 2024-06-24 DIAGNOSIS — Z23 Encounter for immunization: Secondary | ICD-10-CM

## 2024-06-24 DIAGNOSIS — E1169 Type 2 diabetes mellitus with other specified complication: Secondary | ICD-10-CM | POA: Diagnosis not present

## 2024-06-24 NOTE — Progress Notes (Signed)
 Location:  [facility] Room Number: North/113/D Place of Service:  SNF (31)   CODE STATUS: dnr  Allergies  Allergen Reactions   Cheese    Penicillins Hives    Has patient had a PCN reaction causing immediate rash, facial/tongue/throat swelling, SOB or lightheadedness with hypotension: Unknown Has patient had a PCN reaction causing severe rash involving mucus membranes or skin necrosis: Unknown Has patient had a PCN reaction that required hospitalization: No Has patient had a PCN reaction occurring within the last 10 years: No If all of the above answers are NO, then may proceed with Cephalosporin use.     Chief Complaint  Patient presents with   Medical Management of Chronic Issues             Protein calorie malnutrition, severe:Dyslipidemia associated with type 2 diabetes mellitus:  Diabetic peripheral neuropathy    HPI:  He is a 63 y.o. long term resident of this facility being seen for the management of his chronic illnesses: Protein calorie malnutrition, severe:Dyslipidemia associated with type 2 diabetes mellitus:  Diabetic peripheral neuropathy. There are no reports of uncontrolled pain. There are no reports of changes in his weight. His blood pressure readings are controlled.    Past Medical History:  Diagnosis Date   A-fib [facility])    DM (diabetes mellitus) (HCC)    History of seizures    Hypertension    Stroke [facility])     Past Surgical History:  Procedure Laterality Date   CHOLECYSTECTOMY     COLONOSCOPY WITH PROPOFOL  N/A 01/11/2023   Procedure: COLONOSCOPY WITH PROPOFOL ;  Surgeon: Shaaron Lamar HERO, MD;  [facility];  Service: Endoscopy;  Laterality: N/A;   CRANIECTOMY Right 04/01/2017   Procedure: RIGHT DECOMPRESSIVE CRANIECTOMY;  Surgeon: Ditty, Morene Hicks, MD;  Location: [facility] OR;  Service: Neurosurgery;  Laterality: Right;   ESOPHAGOGASTRODUODENOSCOPY N/A 04/13/2017   Procedure: ESOPHAGOGASTRODUODENOSCOPY (EGD);  Surgeon:  Sebastian Moles, MD;  Location: [facility] ENDOSCOPY;  Service: General;  Laterality: N/A;  bedside   ESOPHAGOGASTRODUODENOSCOPY (EGD) WITH PROPOFOL  N/A 01/11/2023   Procedure: ESOPHAGOGASTRODUODENOSCOPY (EGD) WITH PROPOFOL ;  Surgeon: Shaaron Lamar HERO, MD;  [facility];  Service: Endoscopy;  Laterality: N/A;   GIVENS CAPSULE STUDY N/A 05/15/2023   Procedure: GIVENS CAPSULE STUDY;  Surgeon: Shaaron Lamar HERO, MD;  [facility];  Service: Endoscopy;  Laterality: N/A;  730am[facility]   INCISION AND DRAINAGE PERIRECTAL ABSCESS N/A 09/28/2017   Procedure: IRRIGATION AND DEBRIDEMENT PERIANAL ABSCESS;  Surgeon: Kallie Manuelita BROCKS, MD;  Location: [facility] ORS;  Service: General;  Laterality: N/A;   PEG PLACEMENT N/A 04/13/2017   Procedure: PERCUTANEOUS ENDOSCOPIC GASTROSTOMY (PEG) PLACEMENT;  Surgeon: Sebastian Moles, MD;  Location: [facility] ENDOSCOPY;  Service: General;  Laterality: N/A;   POLYPECTOMY  01/11/2023   Procedure: POLYPECTOMY INTESTINAL;  Surgeon: Shaaron Lamar HERO, MD;  [facility];  Service: Endoscopy;;    Social History   Socioeconomic History   Marital status: Widowed    Spouse name: Not on file   Number of children: Not on file   Years of education: Not on file   Highest education level: Not on file  Occupational History   Not on file  Tobacco Use   Smoking status: Former    Types: Cigarettes   Smokeless tobacco: Never   Tobacco comments:    UTA  Vaping Use   Vaping status: Never Used  Substance and Sexual Activity   Alcohol use: No  Comment: UTA   Drug use: No    Comment: UTA   Sexual activity: Not Currently    Birth control/protection: None  Other Topics Concern   Not on file  Social History Narrative   Not on file   Social Drivers of Health   Financial Resource Strain: Not on file  Food Insecurity: No Food Insecurity (01/09/2023)   Hunger Vital Sign    Worried About Running Out of Food in the Last Year: Never true    Ran Out of Food in the  Last Year: Never true  Transportation Needs: No Transportation Needs (01/09/2023)   PRAPARE - Administrator, Civil Service (Medical): No    Lack of Transportation (Non-Medical): No  Physical Activity: Not on file  Stress: Not on file  Social Connections: Not on file  Intimate Partner Violence: Not At Risk (01/09/2023)   Humiliation, Afraid, Rape, and Kick questionnaire    Fear of Current or Ex-Partner: No    Emotionally Abused: No    Physically Abused: No    Sexually Abused: No   Family History  Problem Relation Age of Onset   Hypertension Father    Colon cancer Neg Hx    Colon polyps Neg Hx       VITAL SIGNS BP (!) 158/82   Pulse (!) 104   Temp 98.1 F (36.7 C)   Resp 18   Ht 5' 7 (1.702 m)   Wt 179 lb 9.6 oz (81.5 kg)   SpO2 92%   BMI 28.13 kg/m   Outpatient Encounter Medications as of 06/24/2024  Medication Sig   acetaminophen  (TYLENOL ) 650 MG CR tablet Take 650 mg by mouth every 6 (six) hours.   apixaban  (ELIQUIS ) 5 MG TABS tablet Take 5 mg by mouth 2 (two) times daily.    atorvastatin  (LIPITOR) 20 MG tablet Take 20 mg by mouth at bedtime.   Cholecalciferol  1.25 MG (50000 UT) capsule Take 50,000 Units by mouth every Monday. For Low Vitamin D  Level on Monday   dapagliflozin propanediol (FARXIGA) 10 MG TABS tablet Take 10 mg by mouth daily.   Dextromethorphan -quiNIDine (NUEDEXTA) 20-10 MG capsule Take 1 capsule by mouth 2 (two) times daily.   ferrous sulfate 325 (65 FE) MG EC tablet Take 325 mg by mouth daily. Mon, Wed & Fri.   guaiFENesin -dextromethorphan  (ROBITUSSIN DM) 100-10 MG/5ML syrup Take 15 mLs by mouth every 6 (six) hours as needed for cough.   levETIRAcetam  (KEPPRA  XR) 750 MG 24 hr tablet Take 750 mg by mouth daily.   loratadine (CLARITIN) 10 MG tablet Take 10 mg by mouth daily as needed.   Menthol, Topical Analgesic, (BIOFREEZE) 4 % GEL Apply 1 application  topically at bedtime as needed. Apply to left shoulder   metformin (FORTAMET) 500 MG  (OSM) 24 hr tablet Take 1,000 mg by mouth as directed. 1000 mg; oral Once A Day 10:00 AM   midazolam  (VERSED ) 5 MG/ML injection Inject into the vein as directed. amt: 10mg ; injection Special Instructions: Administer 10mg  (one 2ml vial) IM every 15 minutes up to (4) total doses for seizures lasting >1 minute - As Needed PRN 1, PRN 2, PRN 3, PRN 4, PRN 5, PRN 6   NON FORMULARY Diet:Regular   polyethylene glycol (MIRALAX  / GLYCOLAX ) 17 g packet Take 17 g by mouth daily. 9 pm   QUEtiapine  (SEROQUEL ) 100 MG tablet Take 100 mg by mouth at bedtime.   QUEtiapine  (SEROQUEL ) 25 MG tablet Take 75 mg by mouth daily.  At 10 am   sennosides-docusate sodium  (SENOKOT-S) 8.6-50 MG tablet Take 1 tablet by mouth daily as needed for constipation.   gabapentin  (NEURONTIN ) 100 MG capsule Take 100 mg by mouth at bedtime. (Patient not taking: Reported on 06/24/2024)   metFORMIN (GLUCOPHAGE-XR) 500 MG 24 hr tablet Take 500 mg by mouth daily with breakfast. (Patient not taking: Reported on 06/24/2024)   midazolam  PF (VERSED ) 2 MG/2ML SOLN injection Inject 2 mLs (2 mg total) into the muscle every 15 (fifteen) minutes as needed for agitation. (Patient not taking: Reported on 06/24/2024)   No facility-administered encounter medications on file as of 06/24/2024.     SIGNIFICANT DIAGNOSTIC EXAMS  PREVIOUS   06-10-21: thyroid  biopsy   01-11-23: EGD: normal esophagus; stomach, doudenal bulb and 2dn portion of duodenum; peg scar apparent   01-11-23: colonoscopy:  Two 3-5 mm polyp at hepatic flexure and in the cecum; removed with cold snare Diverticulosis in sigmoid colon and in the descending colon, redundant elongated colon Otherwise normal Doubt signs of GI bleed; may restart eliquis .   NO NEW EXAMS    LABS REVIEWED PREVIOUS:     05-18-23: PSA 3.15 07-21-23: wbc 9.6; hgb 14.6; hct 48.9; mcv 95.0 plt 248; glucose 201; bun 15; creat 0.91; k+ 3.9; na++ 137; ca 9.4 gfr >60  08-04-23: glucose 201; bun 15; creat 0.81; k+  3.9; na++ 137; ca 9.4 gfr >60 08-14-23: chol 98; ldl 44 trig 120 hdl 30 hgb A1c 6.3 09-11-23: hgb 14.9; hct 50.0 iron 88; tibc 300  10-17-23: glucose 183; bun 12; creat 0.92; k+ 4.3; na++ 138; ca 9.8; gfr >60 11-24-23: urine micro-albumin 17.0 12-28-23: wbc 11.9; hgb 13.0; hct 44.6; mcv 94.3 plt 214; glucose 139; bun 12; creat 0.65; k+ 3.7; na++ 136; ca 8.7; gfr >60; protein 6.7 albumin 3.0; tsh 2.575' free t3: 2.8 free t4: 0.78; hgb A1c 5.5     TODAY  04-12-24: wbc 9.7; hgb 13.7; hct 45.3; mcv 91.1 plt 200; glucose 171; bun 9; creat 0.72; k+ 3.8; na++ 138; ca 9.1; gfr >60; protein 6.3 albumin 2.9; hgb A1c 5.7; tsh 1.078 04-30-24: free t4: 0.85 free t3: 3.0 tsh 1.811 09-07-23: psa: 3.19   Review of Systems  Constitutional:  Negative for malaise/fatigue.  Respiratory:  Negative for cough and shortness of breath.   Cardiovascular:  Negative for chest pain, palpitations and leg swelling.  Gastrointestinal:  Negative for abdominal pain, constipation and heartburn.  Musculoskeletal:  Negative for back pain, joint pain and myalgias.  Skin: Negative.   Neurological:  Negative for dizziness.  Psychiatric/Behavioral:  The patient is not nervous/anxious.     Physical Exam Constitutional:      General: He is not in acute distress.    Appearance: He is well-developed. He is not diaphoretic.  Neck:     Thyroid : Thyroid  mass and thyromegaly present.  Cardiovascular:     Rate and Rhythm: Normal rate and regular rhythm.     Heart sounds: Normal heart sounds.  Pulmonary:     Effort: Pulmonary effort is normal. No respiratory distress.     Breath sounds: Normal breath sounds.  Abdominal:     General: Bowel sounds are normal. There is no distension.     Palpations: Abdomen is soft.     Tenderness: There is no abdominal tenderness.  Musculoskeletal:     Cervical back: Neck supple.     Right lower leg: No edema.     Left lower leg: No edema.  Lymphadenopathy:  Cervical: No cervical adenopathy.   Skin:    General: Skin is warm and dry.  Neurological:     Mental Status: He is alert. Mental status is at baseline.     Comments: SLUMS 17/30   Psychiatric:        Mood and Affect: Mood normal.       ASSESSMENT/ PLAN:  TODAY;  Protein calorie malnutrition, severe: albumin 2.5; has declined supplements  2. Dyslipidemia associated with type 2 diabetes mellitus: ldl 44; will continue lipitor 20 mg daily   3. Diabetic peripheral neuropathy: will continue gabapentin  100 mg nightly   PREVIOUS   4. Seizures: no recent activities: will continue keppra  xl 750 mg daily    5. Chronic constipation: will continue senna s daily as needed  6. GERD without esophagitis: is off PPI  7. 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 Vitamin D  deficiency: 43.99 is off supplement  9. 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   10. Multinodular goiter: is status post aspiration: awaiting surgical removal of goiter  11. Type 2 diabetes mellitus with neurological complications: hgb A1c 5.7; will continue metformin xr 1 gm daily farxiga 10 mg daily   12. Hypertension associated with type 2 diabetes mellitus: b/p 158/82 is presently off medications  13. Delusions/paranoia is on seroquel  75 mg in the AM and 100 mg in the PM does have some occasional boutbursts.   14. PAF( Paroxysmal atrial fibrillation) heart rate is stable will continue eliquis  5 mg twice daily   15. PBA (pseudobulbar affect) has few verbal outburst with neudexta twice daily   16. Hypochromic anemia: hgb 13.7 iron panel normal     Barnie Seip NP [facility]   call [phone]

## 2024-07-09 ENCOUNTER — Encounter: Payer: Self-pay | Admitting: Internal Medicine

## 2024-07-09 ENCOUNTER — Non-Acute Institutional Stay (SKILLED_NURSING_FACILITY): Payer: Self-pay | Admitting: Internal Medicine

## 2024-07-09 DIAGNOSIS — E1151 Type 2 diabetes mellitus with diabetic peripheral angiopathy without gangrene: Secondary | ICD-10-CM

## 2024-07-09 DIAGNOSIS — Z7984 Long term (current) use of oral hypoglycemic drugs: Secondary | ICD-10-CM | POA: Diagnosis not present

## 2024-07-09 DIAGNOSIS — E43 Unspecified severe protein-calorie malnutrition: Secondary | ICD-10-CM

## 2024-07-09 DIAGNOSIS — I480 Paroxysmal atrial fibrillation: Secondary | ICD-10-CM

## 2024-07-09 DIAGNOSIS — I69354 Hemiplegia and hemiparesis following cerebral infarction affecting left non-dominant side: Secondary | ICD-10-CM

## 2024-07-09 NOTE — Assessment & Plan Note (Signed)
 Albumin & total protein values discussed with him.

## 2024-07-09 NOTE — Assessment & Plan Note (Signed)
  Kerecis is unchanged.  He tenses exhibits some neglect of the left side.

## 2024-07-09 NOTE — Assessment & Plan Note (Signed)
 Current A1c 5.7% ; I shall discuss decreasing Metformin dose with [facility] NP.

## 2024-07-09 NOTE — Assessment & Plan Note (Signed)
 Slight tachycardia is present but the rhythm is clinically regular.  Continue Eliquis  prophylaxis.

## 2024-07-09 NOTE — Progress Notes (Unsigned)
 NURSING HOME LOCATION:  [facility] ROOM NUMBER:  [id number]  CODE STATUS:  Full Code  PCP: Landy Barnie RAMAN, NP   This is a nursing facility follow up visit of chronic medical diagnoses to document compliance with Regulation 483.30 (c) in The [employer] Survey Manual Phase 2 which mandates caregiver visit ( visits can alternate among physician, PA or NP as per statutes) within 10 days of 30 days / 60 days/ 90 days post admission to SNF date  .  Interim medical record and care since last SNF visit was updated with review of diagnostic studies and change in clinical status since last visit were documented.  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 PSA was checked 06/06/2024 and was normal at 3.19.  Thyroid  panel was completed 9/23 and revealed a therapeutic TSH at 1.811 with normal free T3 and free T4.  On 9/5 A1c was 5.7, up minimally in the prediabetic range.  On that same day CBC was normal with normal indices.  Albumin was 2.9 and total protein 6.3, down from prior values of 3.0 and 6.7.  Review of systems: He describes occasional dysphagia.  He describes excessive thirst.  The remainder of the review of systems is negative.  For the most part his response was a negative shake of the head.  Constitutional: No fever, significant weight change, fatigue  Eyes: No redness, discharge, pain, vision change ENT/mouth: No nasal congestion,  purulent discharge, earache, change in hearing, sore throat  Cardiovascular: No chest pain, palpitations, paroxysmal nocturnal dyspnea, claudication, edema  Respiratory: No cough, sputum production,  hemoptysis, DOE, significant snoring, apnea   Gastrointestinal: No heartburn, dysphagia, abdominal pain, nausea /vomiting, rectal bleeding, melena, change in bowels Genitourinary: No dysuria, hematuria, pyuria, incontinence, nocturia Musculoskeletal: No joint stiffness, joint swelling, weakness, pain Dermatologic: No rash, pruritus, change in appearance of skin Neurologic: No dizziness, headache, syncope, seizures, numbness, tingling Psychiatric: No significant anxiety, depression, insomnia, anorexia Endocrine: No change in hair/skin/nails, excessive thirst, excessive hunger, excessive urination  Hematologic/lymphatic: No significant bruising, lymphadenopathy, abnormal bleeding Allergy/immunology: No itchy/watery eyes, significant sneezing, urticaria, angioedema  Physical exam:  Pertinent or positive findings: Both times up attempted to see him this morning he has been asleep.  There is a well-healed operative deficit over the right lateral skull.  He exhibits slight snoring but no hypopnea or apnea.  Upon awakening he exhibits a baby talk type speech cadence and tone.  Intermittently the left eye is deviated slightly immediately and superiorly.  He is unshaven with beard and mustache.  He is completely edentulous.  He has a large right multinodular goiter.  Slight tachycardia is present.  Trace edema is present at the sock line.  Pedal pulses are intact.  He exhibits a left hemiparesis with clawing of the left hand.  General appearance: Adequately nourished; no acute distress, increased work of breathing is present.   Lymphatic: No lymphadenopathy about the head, neck, axilla. Eyes: No conjunctival inflammation or lid edema is present. There is no scleral icterus. Ears:  External ear exam shows no significant lesions or deformities.   Nose:  External nasal examination shows no deformity or inflammation. Nasal mucosa are pink and moist without lesions, exudates Oral exam:  Lips and gums are  healthy appearing. There is no oropharyngeal erythema or exudate. Neck:  No thyromegaly, masses, tenderness noted.    Heart:  Normal rate and regular rhythm. S1 and S2 normal without gallop, murmur, click, rub .  Lungs: Chest clear to auscultation without wheezes, rhonchi, rales, rubs. Abdomen: Bowel sounds are normal. Abdomen is soft and nontender with no organomegaly, hernias, masses. GU: Deferred  Extremities:  No cyanosis, clubbing, edema  Neurologic exam : Cn 2-7 intact Strength equal  in upper & lower extremities Balance, Rhomberg, finger to nose testing could not be completed due to clinical state Deep tendon reflexes are equal Skin: Warm & dry w/o tenting. No significant lesions or rash.  See summary under each active problem in the Problem List with associated updated therapeutic plan :

## 2024-07-09 NOTE — Patient Instructions (Signed)
 See assessment and plan under each diagnosis in the problem list and acutely for this visit

## 2024-07-16 ENCOUNTER — Ambulatory Visit: Admitting: General Surgery

## 2024-07-16 ENCOUNTER — Encounter: Payer: Self-pay | Admitting: General Surgery

## 2024-07-16 VITALS — BP 159/127 | HR 88 | Temp 98.5°F | Resp 14 | Ht 67.0 in | Wt 185.0 lb

## 2024-07-16 DIAGNOSIS — E042 Nontoxic multinodular goiter: Secondary | ICD-10-CM

## 2024-07-16 NOTE — Progress Notes (Signed)
 Joe Wilkinson; [id number]; 06-Jun-1961   HPI Patient is a 63 year old black male who resides at [facility] nursing home who was referred to my care by Dr. Lenis for evaluation and treatment of an enlarging multinodular goiter.  It has been present for many years and previous biopsies have been negative for malignancy.  He has had enlargement of the right lobe of the thyroid  up to 10 cm.  He does have some dysphagia with food but no voice changes.  He has multiple comorbidities including atrial fibrillation on chronic Eliquis  and a left sided stroke with hemiparesis.  He is in a wheelchair.  He also has type 2 diabetes.  He denies any history of thyroid  cancer.  He denies any heat intolerance or heart palpitations. Past Medical History:  Diagnosis Date   A-fib [facility])    DM (diabetes mellitus) (HCC)    History of seizures    Hypertension    Stroke [facility])     Past Surgical History:  Procedure Laterality Date   CHOLECYSTECTOMY     COLONOSCOPY WITH PROPOFOL  N/A 01/11/2023   Procedure: COLONOSCOPY WITH PROPOFOL ;  Surgeon: Shaaron Lamar HERO, MD;  Location: [facility];  Service: Endoscopy;  Laterality: N/A;   CRANIECTOMY Right 04/01/2017   Procedure: RIGHT DECOMPRESSIVE CRANIECTOMY;  Surgeon: Ditty, Morene Hicks, MD;  Location: [facility] OR;  Service: Neurosurgery;  Laterality: Right;   ESOPHAGOGASTRODUODENOSCOPY N/A 04/13/2017   Procedure: ESOPHAGOGASTRODUODENOSCOPY (EGD);  Surgeon: Sebastian Moles, MD;  Location: [facility] ENDOSCOPY;  Service: General;  Laterality: N/A;  bedside   ESOPHAGOGASTRODUODENOSCOPY (EGD) WITH PROPOFOL  N/A 01/11/2023   Procedure: ESOPHAGOGASTRODUODENOSCOPY (EGD) WITH PROPOFOL ;  Surgeon: Shaaron Lamar HERO, MD;  Location: [facility];  Service: Endoscopy;  Laterality: N/A;   GIVENS CAPSULE STUDY N/A 05/15/2023   Procedure: GIVENS CAPSULE STUDY;  Surgeon: Shaaron Lamar HERO, MD;  Location: [facility];  Service: Endoscopy;  Laterality: N/A;  730am, at [facility]   INCISION AND DRAINAGE  PERIRECTAL ABSCESS N/A 09/28/2017   Procedure: IRRIGATION AND DEBRIDEMENT PERIANAL ABSCESS;  Surgeon: Kallie Manuelita BROCKS, MD;  Location: [facility] ORS;  Service: General;  Laterality: N/A;   PEG PLACEMENT N/A 04/13/2017   Procedure: PERCUTANEOUS ENDOSCOPIC GASTROSTOMY (PEG) PLACEMENT;  Surgeon: Sebastian Moles, MD;  Location: [facility] ENDOSCOPY;  Service: General;  Laterality: N/A;   POLYPECTOMY  01/11/2023   Procedure: POLYPECTOMY INTESTINAL;  Surgeon: Shaaron Lamar HERO, MD;  Location: [facility];  Service: Endoscopy;;    Family History  Problem Relation Age of Onset   Hypertension Father    Colon cancer Neg Hx    Colon polyps Neg Hx     Current Outpatient Medications on File Prior to Visit  Medication Sig Dispense Refill   acetaminophen  (TYLENOL ) 650 MG CR tablet Take 650 mg by mouth every 6 (six) hours.     apixaban  (ELIQUIS ) 5 MG TABS tablet Take 5 mg by mouth 2 (two) times daily.      atorvastatin  (LIPITOR) 20 MG tablet Take 20 mg by mouth at bedtime.     Cholecalciferol  1.25 MG (50000 UT) capsule Take 50,000 Units by mouth every Monday. For Low Vitamin D  Level on Monday     dapagliflozin propanediol (FARXIGA) 10 MG TABS tablet Take 10 mg by mouth daily.     Dextromethorphan -quiNIDine (NUEDEXTA) 20-10 MG capsule Take 1 capsule by mouth 2 (two) times daily.     ferrous sulfate 325 (65 FE) MG EC tablet Take 325 mg by mouth daily. Mon, Wed & Fri.  guaiFENesin -dextromethorphan  (ROBITUSSIN DM) 100-10 MG/5ML syrup Take 15 mLs by mouth every 6 (six) hours as needed for cough.     levETIRAcetam  (KEPPRA  XR) 750 MG 24 hr tablet Take 750 mg by mouth daily.     loratadine (CLARITIN) 10 MG tablet Take 10 mg by mouth daily as needed.     Menthol, Topical Analgesic, (BIOFREEZE) 4 % GEL Apply 1 application  topically at bedtime as needed. Apply to left shoulder     metformin (FORTAMET) 500 MG (OSM) 24 hr tablet Take 1,000 mg by mouth as directed. 1000 mg; oral Once A Day 10:00 AM     midazolam  (VERSED ) 5  MG/ML injection Inject into the vein as directed. amt: 10mg ; injection Special Instructions: Administer 10mg  (one 2ml vial) IM every 15 minutes up to (4) total doses for seizures lasting >1 minute - As Needed PRN 1, PRN 2, PRN 3, PRN 4, PRN 5, PRN 6     NON FORMULARY Diet:Regular     polyethylene glycol (MIRALAX  / GLYCOLAX ) 17 g packet Take 17 g by mouth daily. 9 pm     QUEtiapine  (SEROQUEL ) 100 MG tablet Take 100 mg by mouth at bedtime.     QUEtiapine  (SEROQUEL ) 25 MG tablet Take 75 mg by mouth daily. At 10 am     sennosides-docusate sodium  (SENOKOT-S) 8.6-50 MG tablet Take 1 tablet by mouth daily as needed for constipation.     gabapentin  (NEURONTIN ) 100 MG capsule Take 100 mg by mouth at bedtime. (Patient not taking: Reported on 07/16/2024)     metFORMIN (GLUCOPHAGE-XR) 500 MG 24 hr tablet Take 500 mg by mouth daily with breakfast. (Patient not taking: Reported on 07/16/2024)     midazolam  PF (VERSED ) 2 MG/2ML SOLN injection Inject 2 mLs (2 mg total) into the muscle every 15 (fifteen) minutes as needed for agitation. (Patient not taking: Reported on 07/16/2024) 8 mL 0   No current facility-administered medications on file prior to visit.    Allergies  Allergen Reactions   Cheese    Penicillins Hives    Has patient had a PCN reaction causing immediate rash, facial/tongue/throat swelling, SOB or lightheadedness with hypotension: Unknown Has patient had a PCN reaction causing severe rash involving mucus membranes or skin necrosis: Unknown Has patient had a PCN reaction that required hospitalization: No Has patient had a PCN reaction occurring within the last 10 years: No If all of the above answers are NO, then may proceed with Cephalosporin use.     Social History   Substance and Sexual Activity  Alcohol Use No   Comment: UTA    Social History   Tobacco Use  Smoking Status Former   Types: Cigarettes  Smokeless Tobacco Never  Tobacco Comments   UTA    Review of Systems   Unable to perform ROS: Medical condition    Objective   Vitals:   07/16/24 1359  BP: (!) 159/127  Pulse: 88  Resp: 14  Temp: 98.5 F (36.9 C)  SpO2: 92%    Physical Exam Vitals reviewed.  Constitutional:      Appearance: Normal appearance. He is not ill-appearing.     Comments: Wheelchair-bound  HENT:     Head: Normocephalic and atraumatic.  Neck:     Comments: Patient with significant goiter, right lobe greater than left.  No obvious lymphadenopathy noted.  Trachea appears to be midline.  There is a scar over the jugular notch, but patient denies having a tracheostomy.  No substernal extension noted. Cardiovascular:  Rate and Rhythm: Normal rate. Rhythm irregular.     Heart sounds: Normal heart sounds. No murmur heard.    No friction rub. No gallop.  Pulmonary:     Effort: Pulmonary effort is normal. No respiratory distress.     Breath sounds: Normal breath sounds. No stridor. No wheezing, rhonchi or rales.  Musculoskeletal:     Cervical back: Neck supple.  Neurological:     Mental Status: He is alert and oriented to person, place, and time.   Dr. Barbette notes reviewed  Assessment  Enlarging multinodular goiter with early dysphagia Atrial fibrillation with chronic anticoagulation Left-sided CVA with left hemiparesis, wheelchair-bound Non-insulin -dependent diabetes mellitus Plan  I agree with Dr. Lenis that the patient will need a total thyroidectomy.  Will get CT scan of neck to make sure he does not have any vascular compromise.  Further management is pending those results.  Patient is at higher risk for surgical intervention and we will further assess pending the scan results.

## 2024-07-29 ENCOUNTER — Ambulatory Visit: Admitting: Neurology

## 2024-08-02 ENCOUNTER — Other Ambulatory Visit (HOSPITAL_COMMUNITY)
Admission: RE | Admit: 2024-08-02 | Discharge: 2024-08-02 | Disposition: A | Discharge status: Home / Self Care | Source: Skilled Nursing Facility | Attending: Internal Medicine | Admitting: Internal Medicine

## 2024-08-02 DIAGNOSIS — J1000 Influenza due to other identified influenza virus with unspecified type of pneumonia: Secondary | ICD-10-CM | POA: Insufficient documentation

## 2024-08-02 LAB — RESP PANEL BY RT-PCR (RSV, FLU A&B, COVID)  RVPGX2
Influenza A by PCR: NEGATIVE
Influenza B by PCR: NEGATIVE
Resp Syncytial Virus by PCR: NEGATIVE
SARS Coronavirus 2 by RT PCR: NEGATIVE

## 2024-08-03 ENCOUNTER — Other Ambulatory Visit (HOSPITAL_COMMUNITY)
Admission: RE | Admit: 2024-08-03 | Discharge: 2024-08-03 | Disposition: A | Discharge status: Home / Self Care | Source: Skilled Nursing Facility | Attending: Adult Health | Admitting: Adult Health

## 2024-08-03 DIAGNOSIS — E1149 Type 2 diabetes mellitus with other diabetic neurological complication: Secondary | ICD-10-CM | POA: Diagnosis present

## 2024-08-03 LAB — BASIC METABOLIC PANEL WITH GFR
Anion gap: 8 (ref 5–15)
BUN: 9 mg/dL (ref 8–23)
CO2: 26 mmol/L (ref 22–32)
Calcium: 9 mg/dL (ref 8.9–10.3)
Chloride: 103 mmol/L (ref 98–111)
Creatinine, Ser: 0.74 mg/dL (ref 0.61–1.24)
GFR, Estimated: >60 mL/min
Glucose, Bld: 184 mg/dL — ABNORMAL HIGH (ref 70–99)
Potassium: 3.7 mmol/L (ref 3.5–5.1)
Sodium: 138 mmol/L (ref 135–145)

## 2024-08-13 ENCOUNTER — Non-Acute Institutional Stay (SKILLED_NURSING_FACILITY): Payer: Self-pay | Admitting: Adult Health

## 2024-08-13 ENCOUNTER — Encounter: Payer: Self-pay | Admitting: Adult Health

## 2024-08-13 DIAGNOSIS — K5909 Other constipation: Secondary | ICD-10-CM

## 2024-08-13 DIAGNOSIS — K219 Gastro-esophageal reflux disease without esophagitis: Secondary | ICD-10-CM

## 2024-08-13 DIAGNOSIS — R569 Unspecified convulsions: Secondary | ICD-10-CM | POA: Diagnosis not present

## 2024-08-13 NOTE — Progress Notes (Signed)
 " Location:  [facility] Room Number: 111 Place of Service:  SNF (31)   CODE STATUS: dnr   Allergies[1]  Chief Complaint  Patient presents with   Medical Management of Chronic Issues                 Seizures:  Chronic constipation:  GERD without esophagitis    HPI:  He is a 64 y.o. long term resident of this facility being seen for the management of his chronic illnesses: Seizures:  Chronic constipation:  GERD without esophagitis. There are no reports of uncontrolled pain. He continues to propel himself around facility. No reports of heart burn or constipation. He is taking keppra  xl for seizure control; uses versed  for breakthrough seizures.    Past Medical History:  Diagnosis Date   A-fib [facility])    DM (diabetes mellitus) (HCC)    History of seizures    Hypertension    Stroke [facility])     Past Surgical History:  Procedure Laterality Date   CHOLECYSTECTOMY     COLONOSCOPY WITH PROPOFOL  N/A 01/11/2023   Procedure: COLONOSCOPY WITH PROPOFOL ;  Surgeon: Shaaron Lamar HERO, MD;  Location: [facility] ENDO [facility];  Service: Endoscopy;  Laterality: N/A;   CRANIECTOMY Right 04/01/2017   Procedure: RIGHT DECOMPRESSIVE CRANIECTOMY;  Surgeon: Ditty, Morene Hicks, MD;  Location: [facility] OR;  Service: Neurosurgery;  Laterality: Right;   ESOPHAGOGASTRODUODENOSCOPY N/A 04/13/2017   Procedure: ESOPHAGOGASTRODUODENOSCOPY (EGD);  Surgeon: Sebastian Moles, MD;  Location: [facility] ENDOSCOPY;  Service: General;  Laterality: N/A;  bedside   ESOPHAGOGASTRODUODENOSCOPY (EGD) WITH PROPOFOL  N/A 01/11/2023   Procedure: ESOPHAGOGASTRODUODENOSCOPY (EGD) WITH PROPOFOL ;  Surgeon: Shaaron Lamar HERO, MD;  Location: [facility] ENDO [facility];  Service: Endoscopy;  Laterality: N/A;   GIVENS CAPSULE STUDY N/A 05/15/2023   Procedure: GIVENS CAPSULE STUDY;  Surgeon: Shaaron Lamar HERO, MD;  Location: [facility] ENDO [facility];  Service: Endoscopy;  Laterality: N/A;  730am, at [facility]   INCISION AND DRAINAGE PERIRECTAL ABSCESS N/A 09/28/2017    Procedure: IRRIGATION AND DEBRIDEMENT PERIANAL ABSCESS;  Surgeon: Kallie Manuelita BROCKS, MD;  Location: [facility] ORS;  Service: General;  Laterality: N/A;   PEG PLACEMENT N/A 04/13/2017   Procedure: PERCUTANEOUS ENDOSCOPIC GASTROSTOMY (PEG) PLACEMENT;  Surgeon: Sebastian Moles, MD;  Location: [facility] ENDOSCOPY;  Service: General;  Laterality: N/A;   POLYPECTOMY  01/11/2023   Procedure: POLYPECTOMY INTESTINAL;  Surgeon: Shaaron Lamar HERO, MD;  Location: [facility] ENDO [facility]ITE;  Service: Endoscopy;;    Social History   Socioeconomic History   Marital status: Widowed    Spouse name: Not on file   Number of children: Not on file   Years of education: Not on file   Highest education level: Not on file  Occupational History   Not on file  Tobacco Use   Smoking status: Former    Types: Cigarettes   Smokeless tobacco: Never   Tobacco comments:    UTA  Vaping Use   Vaping status: Never Used  Substance and Sexual Activity   Alcohol use: No    Comment: UTA   Drug use: No    Comment: UTA   Sexual activity: Not Currently    Birth control/protection: None  Other Topics Concern   Not on file  Social History Narrative   Not on file   Social Drivers of Health   Tobacco Use: Medium Risk (08/13/2024)   Patient History    Smoking Tobacco Use: Former    Smokeless Tobacco Use: Never    Passive Exposure: Not  on file  Financial Resource Strain: Not on file  Food Insecurity: No Food Insecurity (01/09/2023)   Hunger Vital Sign    Worried About Running Out of Food in the Last Year: Never true    Ran Out of Food in the Last Year: Never true  Transportation Needs: No Transportation Needs (01/09/2023)   PRAPARE - Administrator, Civil Service (Medical): No    Lack of Transportation (Non-Medical): No  Physical Activity: Not on file  Stress: Not on file  Social Connections: Not on file  Intimate Partner Violence: Not At Risk (01/09/2023)   Humiliation, Afraid, Rape, and Kick questionnaire    Fear of Current or  Ex-Partner: No    Emotionally Abused: No    Physically Abused: No    Sexually Abused: No  Depression (PHQ2-9): Low Risk (08/11/2023)   Depression (PHQ2-9)    PHQ-2 Score: 0  Alcohol Screen: Not on file  Housing: Low Risk (01/09/2023)   Housing    Last Housing Risk Score: 0  Utilities: Not At Risk (01/09/2023)   AHC Utilities    Threatened with loss of utilities: No  Health Literacy: Not on file   Family History  Problem Relation Age of Onset   Hypertension Father    Colon cancer Neg Hx    Colon polyps Neg Hx       VITAL SIGNS BP 138/75   Pulse 67   Temp 98.2 F (36.8 C)   Resp 18   Ht 5' 7 (1.702 m)   Wt 179 lb 6.4 oz (81.4 kg)   SpO2 95%   BMI 28.10 kg/m   Outpatient Encounter Medications as of 08/13/2024  Medication Sig   acetaminophen  (TYLENOL ) 650 MG CR tablet Take 650 mg by mouth every 6 (six) hours.   apixaban  (ELIQUIS ) 5 MG TABS tablet Take 5 mg by mouth 2 (two) times daily.    atorvastatin  (LIPITOR) 20 MG tablet Take 20 mg by mouth at bedtime.   Cholecalciferol  1.25 MG (50000 UT) capsule Take 50,000 Units by mouth every Monday. For Low Vitamin D  Level on Monday   dapagliflozin propanediol (FARXIGA) 10 MG TABS tablet Take 10 mg by mouth daily.   Dextromethorphan -quiNIDine (NUEDEXTA) 20-10 MG capsule Take 1 capsule by mouth 2 (two) times daily.   ferrous sulfate 325 (65 FE) MG EC tablet Take 325 mg by mouth daily. Mon, Wed & Fri.   gabapentin  (NEURONTIN ) 100 MG capsule Take 100 mg by mouth at bedtime. (Patient not taking: Reported on 07/16/2024)   guaiFENesin -dextromethorphan  (ROBITUSSIN DM) 100-10 MG/5ML syrup Take 15 mLs by mouth every 6 (six) hours as needed for cough.   levETIRAcetam  (KEPPRA  XR) 750 MG 24 hr tablet Take 750 mg by mouth daily.   loratadine (CLARITIN) 10 MG tablet Take 10 mg by mouth daily as needed.   Menthol, Topical Analgesic, (BIOFREEZE) 4 % GEL Apply 1 application  topically at bedtime as needed. Apply to left shoulder   metformin (FORTAMET)  500 MG (OSM) 24 hr tablet Take 1,000 mg by mouth as directed. 1000 mg; oral Once A Day 10:00 AM   metFORMIN (GLUCOPHAGE-XR) 500 MG 24 hr tablet Take 500 mg by mouth daily with breakfast. (Patient not taking: Reported on 07/16/2024)   midazolam  (VERSED ) 5 MG/ML injection Inject into the vein as directed. amt: 10mg ; injection Special Instructions: Administer 10mg  (one 2ml vial) IM every 15 minutes up to (4) total doses for seizures lasting >1 minute - As Needed PRN 1, PRN 2, PRN  3, PRN 4, PRN 5, PRN 6   midazolam  PF (VERSED ) 2 MG/2ML SOLN injection Inject 2 mLs (2 mg total) into the muscle every 15 (fifteen) minutes as needed for agitation. (Patient not taking: Reported on 07/16/2024)   NON FORMULARY Diet:Regular   polyethylene glycol (MIRALAX  / GLYCOLAX ) 17 g packet Take 17 g by mouth daily. 9 pm   QUEtiapine  (SEROQUEL ) 100 MG tablet Take 100 mg by mouth at bedtime.   QUEtiapine  (SEROQUEL ) 25 MG tablet Take 75 mg by mouth daily. At 10 am   sennosides-docusate sodium  (SENOKOT-S) 8.6-50 MG tablet Take 1 tablet by mouth daily as needed for constipation.   No facility-administered encounter medications on file as of 08/13/2024.     SIGNIFICANT DIAGNOSTIC EXAMS  PREVIOUS   06-10-21: thyroid  biopsy   01-11-23: EGD: normal esophagus; stomach, doudenal bulb and 2dn portion of duodenum; peg scar apparent   01-11-23: colonoscopy:  Two 3-5 mm polyp at hepatic flexure and in the cecum; removed with cold snare Diverticulosis in sigmoid colon and in the descending colon, redundant elongated colon Otherwise normal Doubt signs of GI bleed; may restart eliquis .   NO NEW EXAMS    LABS REVIEWED PREVIOUS:     08-14-23: chol 98; ldl 44 trig 120 hdl 30 hgb A1c 6.3 09-11-23: hgb 14.9; hct 50.0 iron 88; tibc 300  10-17-23: glucose 183; bun 12; creat 0.92; k+ 4.3; na++ 138; ca 9.8; gfr >60 11-24-23: urine micro-albumin 17.0 12-28-23: wbc 11.9; hgb 13.0; hct 44.6; mcv 94.3 plt 214; glucose 139; bun 12; creat 0.65; k+  3.7; na++ 136; ca 8.7; gfr >60; protein 6.7 albumin 3.0; tsh 2.575' free t3: 2.8 free t4: 0.78; hgb A1c 5.5     TODAY  04-12-24: wbc 9.7; hgb 13.7; hct 45.3; mcv 91.1 plt 200; glucose 171; bun 9; creat 0.72; k+ 3.8; na++ 138; ca 9.1; gfr >60; protein 6.3 albumin 2.9; hgb A1c 5.7; tsh 1.078 04-30-24: free t4: 0.85 free t3: 3.0 tsh 1.811 06-06-24: psa: 3.19  08-03-24: glucose 184; bun 9; creat 0.74; k+ 3.7;na++ 138; ca 9.0 gfr >60   Review of Systems  Constitutional:  Negative for malaise/fatigue.  Respiratory:  Negative for cough and shortness of breath.   Cardiovascular:  Negative for chest pain, palpitations and leg swelling.  Gastrointestinal:  Negative for abdominal pain, constipation and heartburn.  Musculoskeletal:  Negative for back pain, joint pain and myalgias.  Skin: Negative.   Neurological:  Negative for dizziness.  Psychiatric/Behavioral:  The patient is not nervous/anxious.     Physical Exam Constitutional:      General: He is not in acute distress.    Appearance: He is well-developed. He is not diaphoretic.  Neck:     Thyroid : Thyroid  mass and thyromegaly present.  Cardiovascular:     Rate and Rhythm: Normal rate and regular rhythm.     Heart sounds: Normal heart sounds.  Pulmonary:     Effort: Pulmonary effort is normal. No respiratory distress.     Breath sounds: Normal breath sounds.  Abdominal:     General: Bowel sounds are normal. There is no distension.     Palpations: Abdomen is soft.     Tenderness: There is no abdominal tenderness.  Musculoskeletal:     Cervical back: Neck supple.     Right lower leg: No edema.     Left lower leg: No edema.     Comments: Left hemiplegia   Lymphadenopathy:     Cervical: No cervical adenopathy.  Skin:  General: Skin is warm and dry.  Neurological:     Mental Status: He is alert. Mental status is at baseline.     Comments:  SLUMS 17/30   Psychiatric:        Mood and Affect: Mood normal.       ASSESSMENT/  PLAN:  TODAY;  Seizures: no recent activities: will continue keppra  xl 750 mg daily   2. Chronic constipation: will continue senna s daily as needed   3. GERD without esophagitis: is off PPI   PREVIOUS   4. 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 Vitamin D  deficiency: 43.99 is off supplement  6. 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   7. Multinodular goiter: is status post aspiration: awaiting surgical removal of goiter  8. Type 2 diabetes mellitus with neurological complications: hgb A1c 5.7; will continue metformin xr 1 gm daily farxiga 10 mg daily   9. Hypertension associated with type 2 diabetes mellitus: b/p 138/75 is presently off medications  10. Delusions/paranoia is on seroquel  75 mg in the AM and 100 mg in the PM does have some occasional boutbursts.   11. PAF( Paroxysmal atrial fibrillation) heart rate is stable will continue eliquis  5 mg twice daily   12. PBA (pseudobulbar affect) has few verbal outburst with neudexta twice daily   13. Hypochromic anemia: hgb 13.7 iron panel normal   14. Vascular dementia: weight is 179 pounds   15. Protein calorie malnutrition, severe: albumin 2.5; has declined supplements  16. Dyslipidemia associated with type 2 diabetes mellitus: ldl 44; will continue lipitor 20 mg daily   17. Diabetic peripheral neuropathy: will continue gabapentin  100 mg nightly for pain management     Barnie Seip NP [facility]   call [phone]      [1]  Allergies Allergen Reactions   Cheese    Penicillins Hives    Has patient had a PCN reaction causing immediate rash, facial/tongue/throat swelling, SOB or lightheadedness with hypotension: Unknown Has patient had a PCN reaction causing severe rash involving mucus  membranes or skin necrosis: Unknown Has patient had a PCN reaction that required hospitalization: No Has patient had a PCN reaction occurring within the last 10 years: No If all of the above answers are NO, then may proceed with Cephalosporin use.    "

## 2024-08-15 ENCOUNTER — Other Ambulatory Visit (HOSPITAL_COMMUNITY)
Admission: RE | Admit: 2024-08-15 | Discharge: 2024-08-15 | Disposition: A | Discharge status: Home / Self Care | Source: Skilled Nursing Facility | Attending: Internal Medicine | Admitting: Internal Medicine

## 2024-08-15 DIAGNOSIS — E1149 Type 2 diabetes mellitus with other diabetic neurological complication: Secondary | ICD-10-CM | POA: Insufficient documentation

## 2024-08-15 LAB — HEMOGLOBIN A1C
Hgb A1c MFr Bld: 6.6 % — ABNORMAL HIGH (ref 4.8–5.6)
Mean Plasma Glucose: 142.72 mg/dL

## 2024-08-15 LAB — LIPID PANEL
Cholesterol: 128 mg/dL (ref 0–200)
HDL: 35 mg/dL — ABNORMAL LOW
LDL Cholesterol: 66 mg/dL (ref 0–99)
Total CHOL/HDL Ratio: 3.6 ratio
Triglycerides: 133 mg/dL
VLDL: 27 mg/dL (ref 0–40)

## 2024-08-16 ENCOUNTER — Ambulatory Visit (HOSPITAL_COMMUNITY)
Admission: RE | Admit: 2024-08-16 | Discharge: 2024-08-16 | Disposition: A | Source: Ambulatory Visit | Attending: General Surgery | Admitting: General Surgery

## 2024-08-16 DIAGNOSIS — E042 Nontoxic multinodular goiter: Secondary | ICD-10-CM | POA: Diagnosis present

## 2024-08-16 MED ORDER — IOHEXOL 300 MG/ML  SOLN
75.0000 mL | Freq: Once | INTRAMUSCULAR | Status: AC | PRN
  Administered 2024-08-16: 75 mL via INTRAVENOUS

## 2024-08-26 ENCOUNTER — Non-Acute Institutional Stay (SKILLED_NURSING_FACILITY): Payer: Self-pay | Admitting: Family Medicine

## 2024-08-26 DIAGNOSIS — Z7984 Long term (current) use of oral hypoglycemic drugs: Secondary | ICD-10-CM | POA: Diagnosis not present

## 2024-08-26 DIAGNOSIS — K5909 Other constipation: Secondary | ICD-10-CM

## 2024-08-26 DIAGNOSIS — I69354 Hemiplegia and hemiparesis following cerebral infarction affecting left non-dominant side: Secondary | ICD-10-CM | POA: Diagnosis not present

## 2024-08-26 DIAGNOSIS — E43 Unspecified severe protein-calorie malnutrition: Secondary | ICD-10-CM

## 2024-08-26 DIAGNOSIS — E1151 Type 2 diabetes mellitus with diabetic peripheral angiopathy without gangrene: Secondary | ICD-10-CM

## 2024-08-26 DIAGNOSIS — I480 Paroxysmal atrial fibrillation: Secondary | ICD-10-CM

## 2024-08-26 NOTE — Progress Notes (Signed)
 " Location:  [facility]   Place of Service:  Room number 113   CODE STATUS: DNR/DNI  Allergies[1]  HPI: 64 yo male, long-term resident of [facility] assessed for ongoing continuity visits.  Report he is having some difficulty swallowing due to thyroid  enlargement. Still able to eat and drink. Feels like food is getting caught at times.  Urinating normally. Having 3 Bms daily that are normal. Denies loose stools. Denies blood in his stools or dark stools.    Past Medical History:  Diagnosis Date   A-fib [facility])    DM (diabetes mellitus) (HCC)    History of seizures    Hypertension    Stroke [facility])     Past Surgical History:  Procedure Laterality Date   CHOLECYSTECTOMY     COLONOSCOPY WITH PROPOFOL  N/A 01/11/2023   Procedure: COLONOSCOPY WITH PROPOFOL ;  Surgeon: Shaaron Lamar HERO, MD;  [facility];  Service: Endoscopy;  Laterality: N/A;   CRANIECTOMY Right 04/01/2017   Procedure: RIGHT DECOMPRESSIVE CRANIECTOMY;  Surgeon: Ditty, Morene Hicks, MD;  Location: [facility] OR;  Service: Neurosurgery;  Laterality: Right;   ESOPHAGOGASTRODUODENOSCOPY N/A 04/13/2017   Procedure: ESOPHAGOGASTRODUODENOSCOPY (EGD);  Surgeon: Sebastian Moles, MD;  Location: [facility] ENDOSCOPY;  Service: General;  Laterality: N/A;  bedside   ESOPHAGOGASTRODUODENOSCOPY (EGD) WITH PROPOFOL  N/A 01/11/2023   Procedure: ESOPHAGOGASTRODUODENOSCOPY (EGD) WITH PROPOFOL ;  Surgeon: Shaaron Lamar HERO, MD;  [facility];  Service: Endoscopy;  Laterality: N/A;   GIVENS CAPSULE STUDY N/A 05/15/2023   Procedure: GIVENS CAPSULE STUDY;  Surgeon: Shaaron Lamar HERO, MD;  [facility];  Service: Endoscopy;  Laterality: N/A;  730am, [facility]   INCISION AND DRAINAGE PERIRECTAL ABSCESS N/A 09/28/2017   Procedure: IRRIGATION AND DEBRIDEMENT PERIANAL ABSCESS;  Surgeon: Kallie Manuelita BROCKS, MD;  Location: [facility] ORS;  Service: General;  Laterality: N/A;   PEG PLACEMENT N/A 04/13/2017   Procedure:  PERCUTANEOUS ENDOSCOPIC GASTROSTOMY (PEG) PLACEMENT;  Surgeon: Sebastian Moles, MD;  Location: [facility] ENDOSCOPY;  Service: General;  Laterality: N/A;   POLYPECTOMY  01/11/2023   Procedure: POLYPECTOMY INTESTINAL;  Surgeon: Shaaron Lamar HERO, MD;  [facility];  Service: Endoscopy;;    Social History   Socioeconomic History   Marital status: Widowed    Spouse name: Not on file   Number of children: Not on file   Years of education: Not on file   Highest education level: Not on file  Occupational History   Not on file  Tobacco Use   Smoking status: Former    Types: Cigarettes   Smokeless tobacco: Never   Tobacco comments:    UTA  Vaping Use   Vaping status: Never Used  Substance and Sexual Activity   Alcohol use: No    Comment: UTA   Drug use: No    Comment: UTA   Sexual activity: Not Currently    Birth control/protection: None  Other Topics Concern   Not on file  Social History Narrative   Not on file   Social Drivers of Health   Tobacco Use: Medium Risk (08/13/2024)   Patient History    Smoking Tobacco Use: Former    Smokeless Tobacco Use: Never    Passive Exposure: Not on Actuary Strain: Not on file  Food Insecurity: No Food Insecurity (01/09/2023)   Hunger Vital Sign    Worried About Running Out of Food in the Last Year: Never true    Ran Out of Food in the  Last Year: Never true  Transportation Needs: No Transportation Needs (01/09/2023)   PRAPARE - Administrator, Civil Service (Medical): No    Lack of Transportation (Non-Medical): No  Physical Activity: Not on file  Stress: Not on file  Social Connections: Not on file  Intimate Partner Violence: Not At Risk (01/09/2023)   Humiliation, Afraid, Rape, and Kick questionnaire    Fear of Current or Ex-Partner: No    Emotionally Abused: No    Physically Abused: No    Sexually Abused: No  Depression (PHQ2-9): Low Risk (08/11/2023)   Depression (PHQ2-9)    PHQ-2 Score: 0  Alcohol Screen:  Not on file  Housing: Low Risk (01/09/2023)   Housing    Last Housing Risk Score: 0  Utilities: Not At Risk (01/09/2023)   AHC Utilities    Threatened with loss of utilities: No  Health Literacy: Not on file   Family History  Problem Relation Age of Onset   Hypertension Father    Colon cancer Neg Hx    Colon polyps Neg Hx       VITAL SIGNS Collected 08/25/2024:  T: 97.5 P: 74 RR: 20 BP: 108/62 O2: 97%  General: Well-appearing. Alert. NAD HEENT: Normocephalic. White sclera. Significant asymmetric goiter (R>L). CV: RRR without murmur Pulm: CTAB. Normal WOB on RA. No wheezing Abdomen: Soft, non-tender, non-distended. +BS Ext: Well perfused. Cap refill < 3 seconds Neuro: Left sided contracture.   Outpatient Encounter Medications as of 08/26/2024  Medication Sig   acetaminophen  (TYLENOL ) 650 MG CR tablet Take 650 mg by mouth every 6 (six) hours.   apixaban  (ELIQUIS ) 5 MG TABS tablet Take 5 mg by mouth 2 (two) times daily.    atorvastatin  (LIPITOR) 20 MG tablet Take 20 mg by mouth at bedtime.   Cholecalciferol  1.25 MG (50000 UT) capsule Take 50,000 Units by mouth every Monday. For Low Vitamin D  Level on Monday   dapagliflozin propanediol (FARXIGA) 10 MG TABS tablet Take 10 mg by mouth daily.   Dextromethorphan -quiNIDine (NUEDEXTA) 20-10 MG capsule Take 1 capsule by mouth 2 (two) times daily.   ferrous sulfate 325 (65 FE) MG EC tablet Take 325 mg by mouth daily. Mon, Wed & Fri.   guaiFENesin -dextromethorphan  (ROBITUSSIN DM) 100-10 MG/5ML syrup Take 15 mLs by mouth every 6 (six) hours as needed for cough.   levETIRAcetam  (KEPPRA  XR) 750 MG 24 hr tablet Take 750 mg by mouth daily.   loratadine (CLARITIN) 10 MG tablet Take 10 mg by mouth daily as needed.   Menthol, Topical Analgesic, (BIOFREEZE) 4 % GEL Apply 1 application  topically at bedtime as needed. Apply to left shoulder   metformin (FORTAMET) 500 MG (OSM) 24 hr tablet Take 1,000 mg by mouth as directed. 1000 mg; oral Once A  Day 10:00 AM   midazolam  (VERSED ) 5 MG/ML injection Inject into the vein as directed. amt: 10mg ; injection Special Instructions: Administer 10mg  (one 2ml vial) IM every 15 minutes up to (4) total doses for seizures lasting >1 minute - As Needed PRN 1, PRN 2, PRN 3, PRN 4, PRN 5, PRN 6   midazolam  PF (VERSED ) 2 MG/2ML SOLN injection Inject 2 mLs (2 mg total) into the muscle every 15 (fifteen) minutes as needed for agitation. (Patient not taking: Reported on 07/16/2024)   NON FORMULARY Diet:Regular   polyethylene glycol (MIRALAX  / GLYCOLAX ) 17 g packet Take 17 g by mouth daily. 9 pm   QUEtiapine  (SEROQUEL ) 25 MG tablet Take 75 mg by mouth 2 (  two) times daily. At 10 am   sennosides-docusate sodium  (SENOKOT-S) 8.6-50 MG tablet Take 1 tablet by mouth daily as needed for constipation.   [DISCONTINUED] gabapentin  (NEURONTIN ) 100 MG capsule Take 100 mg by mouth at bedtime. (Patient not taking: Reported on 07/16/2024)   [DISCONTINUED] metFORMIN (GLUCOPHAGE-XR) 500 MG 24 hr tablet Take 500 mg by mouth daily with breakfast. (Patient not taking: Reported on 07/16/2024)   [DISCONTINUED] QUEtiapine  (SEROQUEL ) 100 MG tablet Take 75 mg by mouth at bedtime.   No facility-administered encounter medications on file as of 08/26/2024.     SIGNIFICANT DIAGNOSTIC EXAMS  08/15/2024: A1c: 6/6 BMP: Scr 0.74, GFR >60 Lipid panel: HDL 35, LDL 66, total 128  ASSESSMENT/ PLAN:  H/o CVA with hemiplegia: On Atorvastatin  20mg  daily. LDL 66 on 08/15/2024, at goal. Using assistive wheelchair.  Vitamin D  deficiency: On 50,000 international units weekly. Last level on 06/2022. Consider repeat vitamin D  level and transitioning to maintenance dosing.  pAF: On Eliquis  5mg  BID. No signs of bleeding.  Protein calorie malnutrition: On Ensure BID. Normal TSH in 04/2024. Intermittently refusing supplements. Weight trend stable.  T2DM: On Farxiga 10mg  daily and Metformin 1000mg  daily. A1c 6.6 on 08/15/2024, at goal.  IDA: On ferrous  sulfate every other day. Last CBC wnl on 04/2024. Consider ferritin to assess need for iron supplementation in the setting of chronic constipation.  Seizure disorder: On Keppra  750mg  at bedtime. No breakthrough seizures.  Chronic constipation: On Miralax  daily and senna prn but has not required additional doses. Reports daily Bms, consider making Miralax  prn.  Pseudobulbar affect: On Nuedexta BID. Few distressing outbursts reported.  Delusional disorder: On Seroquel  75mg  BID, symptoms stable.  Goiter: Notable upon exam. Normal thyroid  studies in 04/2024. Ongoing evaluation for surgical removal.  Izetta Nap, DO PGY-3     [1]  Allergies Allergen Reactions   Cheese    Penicillins Hives    Has patient had a PCN reaction causing immediate rash, facial/tongue/throat swelling, SOB or lightheadedness with hypotension: Unknown Has patient had a PCN reaction causing severe rash involving mucus membranes or skin necrosis: Unknown Has patient had a PCN reaction that required hospitalization: No Has patient had a PCN reaction occurring within the last 10 years: No If all of the above answers are NO, then may proceed with Cephalosporin use.    "

## 2024-08-28 ENCOUNTER — Non-Acute Institutional Stay (SKILLED_NURSING_FACILITY): Payer: Self-pay | Admitting: Internal Medicine

## 2024-08-28 ENCOUNTER — Encounter: Payer: Self-pay | Admitting: Internal Medicine

## 2024-08-28 ENCOUNTER — Other Ambulatory Visit (HOSPITAL_COMMUNITY)
Admission: RE | Admit: 2024-08-28 | Discharge: 2024-08-28 | Disposition: A | Discharge status: Home / Self Care | Source: Skilled Nursing Facility | Attending: Internal Medicine | Admitting: Internal Medicine

## 2024-08-28 DIAGNOSIS — D509 Iron deficiency anemia, unspecified: Secondary | ICD-10-CM | POA: Diagnosis present

## 2024-08-28 DIAGNOSIS — E43 Unspecified severe protein-calorie malnutrition: Secondary | ICD-10-CM | POA: Diagnosis present

## 2024-08-28 DIAGNOSIS — L989 Disorder of the skin and subcutaneous tissue, unspecified: Secondary | ICD-10-CM | POA: Insufficient documentation

## 2024-08-28 DIAGNOSIS — E1165 Type 2 diabetes mellitus with hyperglycemia: Secondary | ICD-10-CM | POA: Diagnosis present

## 2024-08-28 LAB — CBC WITH DIFFERENTIAL/PLATELET
Abs Immature Granulocytes: 0.03 K/uL (ref 0.00–0.07)
Basophils Absolute: 0.1 K/uL (ref 0.0–0.1)
Basophils Relative: 1 %
Eosinophils Absolute: 0.4 K/uL (ref 0.0–0.5)
Eosinophils Relative: 4 %
HCT: 48.3 % (ref 39.0–52.0)
Hemoglobin: 13.9 g/dL (ref 13.0–17.0)
Immature Granulocytes: 0 %
Lymphocytes Relative: 30 %
Lymphs Abs: 2.8 K/uL (ref 0.7–4.0)
MCH: 26.1 pg (ref 26.0–34.0)
MCHC: 28.8 g/dL — ABNORMAL LOW (ref 30.0–36.0)
MCV: 90.8 fL (ref 80.0–100.0)
Monocytes Absolute: 0.8 K/uL (ref 0.1–1.0)
Monocytes Relative: 8 %
Neutro Abs: 5.3 K/uL (ref 1.7–7.7)
Neutrophils Relative %: 57 %
Platelets: 243 K/uL (ref 150–400)
RBC: 5.32 MIL/uL (ref 4.22–5.81)
RDW: 15.5 % (ref 11.5–15.5)
WBC: 9.3 K/uL (ref 4.0–10.5)
nRBC: 0 % (ref 0.0–0.2)

## 2024-08-28 NOTE — Progress Notes (Unsigned)
"  ° °  NURSING HOME LOCATION:  [facility] ROOM NUMBER:    CODE STATUS:  Full Code  PCP: Landy Barnie RAMAN, NP   This is a nursing facility follow up visit for specific acute issue of pruritic skin lesions..  Interim medical record and care since last SNF visit was updated with review of diagnostic studies and change in clinical status since last visit were documented.  HPI: While bathing him , staff noted grouped hypopigmented lesions were noted over the right thigh as well as the left ischial area.  The patient does validate some pruritus.  He denies scratching these lesions.  The lesions are in the context of him mobilizing in a wheelchair by propelling himself backwards with his right lower extremity around the circumference of the facility.  Review of systems: Dementia invalidated responses. Date given as   Constitutional: No fever, significant weight change, fatigue  Eyes: No redness, discharge, pain, vision change ENT/mouth: No nasal congestion,  purulent discharge, earache, change in hearing, sore throat  Cardiovascular: No chest pain, palpitations, paroxysmal nocturnal dyspnea, claudication, edema  Respiratory: No cough, sputum production, hemoptysis, DOE, significant snoring, apnea   Gastrointestinal: No heartburn, dysphagia, abdominal pain, nausea /vomiting, rectal bleeding, melena, change in bowels Genitourinary: No dysuria, hematuria, pyuria, incontinence, nocturia Musculoskeletal: No joint stiffness, joint swelling, weakness, pain Dermatologic: No rash, pruritus, change in appearance of skin Neurologic: No dizziness, headache, syncope, seizures, numbness, tingling Psychiatric: No significant anxiety, depression, insomnia, anorexia Endocrine: No change in hair/skin/nails, excessive thirst, excessive hunger, excessive urination  Hematologic/lymphatic: No significant bruising, lymphadenopathy, abnormal bleeding Allergy/immunology: No itchy/watery eyes,  significant sneezing, urticaria, angioedema  Physical exam:  Pertinent or positive findings: The hyperpigmented circular lesions almost appear like hematomas.  There is no blanching to pressure. General appearance: Adequately nourished; no acute distress, increased work of breathing is present.   Lymphatic: No lymphadenopathy about the head, neck, axilla. Eyes: No conjunctival inflammation or lid edema is present. There is no scleral icterus. Ears:  External ear exam shows no significant lesions or deformities.   Nose:  External nasal examination shows no deformity or inflammation. Nasal mucosa are pink and moist without lesions, exudates Oral exam:  Lips and gums are healthy appearing. There is no oropharyngeal erythema or exudate. Neck:  No thyromegaly, masses, tenderness noted.    Heart:  Normal rate and regular rhythm. S1 and S2 normal without gallop, murmur, click, rub .  Lungs: Chest clear to auscultation without wheezes, rhonchi, rales, rubs. Abdomen: Bowel sounds are normal. Abdomen is soft and nontender with no organomegaly, hernias, masses. GU: Deferred  Extremities:  No cyanosis, clubbing, edema  Neurologic exam : Cn 2-7 intact Strength equal  in upper & lower extremities Balance, Rhomberg, finger to nose testing could not be completed due to clinical state Deep tendon reflexes are equal Skin: Warm & dry w/o tenting. No significant lesions or rash.  See summary under each active problem in the Problem List with associated updated therapeutic plan  "

## 2024-08-28 NOTE — Patient Instructions (Signed)
 See assessment and plan under each diagnosis in the problem list and acutely for this visit

## 2024-08-28 NOTE — Assessment & Plan Note (Signed)
 Wound care nurse to monitor.  PT/OT to evaluate.  Current wheelchair cushions to see if trauma can be avoided.

## 2024-08-29 ENCOUNTER — Other Ambulatory Visit (HOSPITAL_COMMUNITY)
Admission: RE | Admit: 2024-08-29 | Discharge: 2024-08-29 | Disposition: A | Discharge status: Home / Self Care | Source: Skilled Nursing Facility | Attending: Adult Health | Admitting: Adult Health

## 2024-08-29 DIAGNOSIS — D509 Iron deficiency anemia, unspecified: Secondary | ICD-10-CM | POA: Insufficient documentation

## 2024-08-29 LAB — VITAMIN D 25 HYDROXY (VIT D DEFICIENCY, FRACTURES): Vit D, 25-Hydroxy: 105 ng/mL — ABNORMAL HIGH (ref 30–100)

## 2024-09-05 ENCOUNTER — Telehealth: Payer: Self-pay | Admitting: *Deleted

## 2024-09-05 DIAGNOSIS — E042 Nontoxic multinodular goiter: Secondary | ICD-10-CM

## 2024-09-05 NOTE — Telephone Encounter (Signed)
 Reviewed CT soft tissue neck results with Dr. Mavis.   Noted goiter too complex for [facility].   Refer to [facility].  Call placed to facility and nurse made aware of pending referral.

## 2024-10-07 ENCOUNTER — Ambulatory Visit: Admitting: Neurology
